# Patient Record
Sex: Male | Born: 1985 | Race: Black or African American | Hispanic: No | Marital: Single | Smoking: Never smoker
Health system: Southern US, Community
[De-identification: ages and names within clinical notes are randomized; demographics above are authoritative.]

## PROBLEM LIST (undated history)

## (undated) DIAGNOSIS — I214 Non-ST elevation (NSTEMI) myocardial infarction: Secondary | ICD-10-CM

## (undated) DIAGNOSIS — I4891 Unspecified atrial fibrillation: Secondary | ICD-10-CM

## (undated) DIAGNOSIS — I340 Nonrheumatic mitral (valve) insufficiency: Secondary | ICD-10-CM

## (undated) DIAGNOSIS — E0590 Thyrotoxicosis, unspecified without thyrotoxic crisis or storm: Secondary | ICD-10-CM

## (undated) DIAGNOSIS — Z789 Other specified health status: Secondary | ICD-10-CM

## (undated) HISTORY — PX: NO PAST SURGERIES: SHX2092

---
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
## 2007-11-06 ENCOUNTER — Emergency Department (HOSPITAL_COMMUNITY): Admission: EM | Admit: 2007-11-06 | Discharge: 2007-11-06 | Payer: Self-pay | Admitting: Emergency Medicine

## 2007-12-19 ENCOUNTER — Emergency Department (HOSPITAL_COMMUNITY): Admission: EM | Admit: 2007-12-19 | Discharge: 2007-12-19 | Payer: Self-pay | Admitting: Emergency Medicine

## 2008-10-10 ENCOUNTER — Emergency Department (HOSPITAL_COMMUNITY): Admission: EM | Admit: 2008-10-10 | Discharge: 2008-10-10 | Payer: Self-pay | Admitting: Emergency Medicine
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
## 2010-08-21 LAB — URINALYSIS, ROUTINE W REFLEX MICROSCOPIC
Nitrite: NEGATIVE
Specific Gravity, Urine: 1.02 (ref 1.005–1.030)
pH: 8 (ref 5.0–8.0)

## 2010-08-21 LAB — TYPE AND SCREEN
ABO/RH(D): O POS
Antibody Screen: NEGATIVE

## 2010-08-21 LAB — COMPREHENSIVE METABOLIC PANEL
ALT: 13 U/L (ref 0–53)
Alkaline Phosphatase: 70 U/L (ref 39–117)
Chloride: 103 mEq/L (ref 96–112)
Glucose, Bld: 103 mg/dL — ABNORMAL HIGH (ref 70–99)
Potassium: 3.5 mEq/L (ref 3.5–5.1)
Sodium: 138 mEq/L (ref 135–145)
Total Bilirubin: 0.9 mg/dL (ref 0.3–1.2)
Total Protein: 7.2 g/dL (ref 6.0–8.3)

## 2010-08-21 LAB — DIFFERENTIAL
Basophils Relative: 0 % (ref 0–1)
Eosinophils Absolute: 0.1 10*3/uL (ref 0.0–0.7)
Monocytes Absolute: 0.6 10*3/uL (ref 0.1–1.0)
Monocytes Relative: 9 % (ref 3–12)
Neutrophils Relative %: 72 % (ref 43–77)

## 2010-08-21 LAB — CBC
Hemoglobin: 14.3 g/dL (ref 13.0–17.0)
MCHC: 35.9 g/dL (ref 30.0–36.0)
MCV: 90.2 fL (ref 78.0–100.0)
RBC: 4.42 MIL/uL (ref 4.22–5.81)
RDW: 12.1 % (ref 11.5–15.5)

## 2010-08-21 LAB — RAPID URINE DRUG SCREEN, HOSP PERFORMED: Tetrahydrocannabinol: POSITIVE — AB

## 2010-08-21 LAB — PROTIME-INR: Prothrombin Time: 13.1 seconds (ref 11.6–15.2)

## 2011-02-07 LAB — URINALYSIS, ROUTINE W REFLEX MICROSCOPIC
Glucose, UA: NEGATIVE
Leukocytes, UA: NEGATIVE
Nitrite: NEGATIVE
Protein, ur: 30 — AB
Urobilinogen, UA: 4 — ABNORMAL HIGH

## 2011-02-07 LAB — RAPID STREP SCREEN (MED CTR MEBANE ONLY): Streptococcus, Group A Screen (Direct): NEGATIVE

## 2011-02-07 LAB — DIFFERENTIAL
Eosinophils Absolute: 0
Lymphs Abs: 1.2
Neutrophils Relative %: 50

## 2011-02-07 LAB — CBC
MCV: 87.4
Platelets: 187
WBC: 5

## 2011-02-07 LAB — BASIC METABOLIC PANEL
BUN: 12
Creatinine, Ser: 0.99
GFR calc non Af Amer: >60

## 2011-02-07 LAB — STREP A DNA PROBE

## 2011-02-07 LAB — URINE MICROSCOPIC-ADD ON

## 2011-02-08 LAB — CBC
Hemoglobin: 14.1
MCHC: 33.6
RBC: 4.58
WBC: 9.1

## 2011-02-08 LAB — POCT I-STAT, CHEM 8
BUN: 8
Calcium, Ion: 1.14
Chloride: 105
Creatinine, Ser: 1.2
Glucose, Bld: 108 — ABNORMAL HIGH
Potassium: 3.8

## 2011-02-08 LAB — DIFFERENTIAL
Lymphocytes Relative: 11 — ABNORMAL LOW
Lymphs Abs: 1
Monocytes Absolute: 0.7
Monocytes Relative: 8
Neutro Abs: 7.2

## 2016-08-16 ENCOUNTER — Emergency Department (HOSPITAL_COMMUNITY): Payer: No Typology Code available for payment source

## 2016-08-16 ENCOUNTER — Emergency Department (HOSPITAL_COMMUNITY)
Admission: EM | Admit: 2016-08-16 | Discharge: 2016-08-16 | Disposition: A | Discharge status: Home / Self Care | Payer: No Typology Code available for payment source | Attending: Emergency Medicine | Admitting: Emergency Medicine

## 2016-08-16 ENCOUNTER — Encounter (HOSPITAL_COMMUNITY): Payer: Self-pay

## 2016-08-16 DIAGNOSIS — M549 Dorsalgia, unspecified: Secondary | ICD-10-CM

## 2016-08-16 DIAGNOSIS — M546 Pain in thoracic spine: Secondary | ICD-10-CM | POA: Diagnosis not present

## 2016-08-16 DIAGNOSIS — S0181XA Laceration without foreign body of other part of head, initial encounter: Secondary | ICD-10-CM | POA: Diagnosis not present

## 2016-08-16 DIAGNOSIS — M542 Cervicalgia: Secondary | ICD-10-CM | POA: Insufficient documentation

## 2016-08-16 DIAGNOSIS — Y92410 Unspecified street and highway as the place of occurrence of the external cause: Secondary | ICD-10-CM | POA: Diagnosis not present

## 2016-08-16 DIAGNOSIS — Y9389 Activity, other specified: Secondary | ICD-10-CM | POA: Insufficient documentation

## 2016-08-16 DIAGNOSIS — Z23 Encounter for immunization: Secondary | ICD-10-CM | POA: Diagnosis not present

## 2016-08-16 DIAGNOSIS — F329 Major depressive disorder, single episode, unspecified: Secondary | ICD-10-CM | POA: Diagnosis not present

## 2016-08-16 DIAGNOSIS — F32A Depression, unspecified: Secondary | ICD-10-CM

## 2016-08-16 DIAGNOSIS — R52 Pain, unspecified: Secondary | ICD-10-CM

## 2016-08-16 DIAGNOSIS — Y999 Unspecified external cause status: Secondary | ICD-10-CM | POA: Insufficient documentation

## 2016-08-16 DIAGNOSIS — S0990XA Unspecified injury of head, initial encounter: Secondary | ICD-10-CM | POA: Diagnosis present

## 2016-08-16 LAB — COMPREHENSIVE METABOLIC PANEL
ALT: 35 U/L (ref 17–63)
AST: 27 U/L (ref 15–41)
Albumin: 3.8 g/dL (ref 3.5–5.0)
Alkaline Phosphatase: 108 U/L (ref 38–126)
Anion gap: 9 (ref 5–15)
BILIRUBIN TOTAL: 0.4 mg/dL (ref 0.3–1.2)
BUN: 14 mg/dL (ref 6–20)
CO2: 24 mmol/L (ref 22–32)
CREATININE: 0.7 mg/dL (ref 0.61–1.24)
Calcium: 8.7 mg/dL — ABNORMAL LOW (ref 8.9–10.3)
Chloride: 104 mmol/L (ref 101–111)
GFR calc Af Amer: >60 mL/min (ref >60)
Glucose, Bld: 148 mg/dL — ABNORMAL HIGH (ref 65–99)
POTASSIUM: 3.7 mmol/L (ref 3.5–5.1)
Sodium: 137 mmol/L (ref 135–145)
TOTAL PROTEIN: 7 g/dL (ref 6.5–8.1)

## 2016-08-16 LAB — ACETAMINOPHEN LEVEL: Acetaminophen (Tylenol), Serum: <10 ug/mL — ABNORMAL LOW (ref 10–30)

## 2016-08-16 LAB — RAPID URINE DRUG SCREEN, HOSP PERFORMED
Amphetamines: NOT DETECTED
Barbiturates: NOT DETECTED
Benzodiazepines: NOT DETECTED
COCAINE: NOT DETECTED
OPIATES: NOT DETECTED
Tetrahydrocannabinol: POSITIVE — AB

## 2016-08-16 LAB — CBC WITH DIFFERENTIAL/PLATELET
BASOS ABS: 0 10*3/uL (ref 0.0–0.1)
Basophils Relative: 0 %
Eosinophils Absolute: 0 10*3/uL (ref 0.0–0.7)
Eosinophils Relative: 0 %
HCT: 38.5 % — ABNORMAL LOW (ref 39.0–52.0)
Hemoglobin: 14 g/dL (ref 13.0–17.0)
LYMPHS ABS: 1.5 10*3/uL (ref 0.7–4.0)
LYMPHS PCT: 14 %
MCH: 28.3 pg (ref 26.0–34.0)
MCHC: 36.4 g/dL — ABNORMAL HIGH (ref 30.0–36.0)
MCV: 77.8 fL — AB (ref 78.0–100.0)
MONO ABS: 0.8 10*3/uL (ref 0.1–1.0)
Monocytes Relative: 7 %
NEUTROS ABS: 8.4 10*3/uL — AB (ref 1.7–7.7)
Neutrophils Relative %: 79 %
Platelets: 293 10*3/uL (ref 150–400)
RBC: 4.95 MIL/uL (ref 4.22–5.81)
RDW: 12.8 % (ref 11.5–15.5)
WBC: 10.7 10*3/uL — ABNORMAL HIGH (ref 4.0–10.5)

## 2016-08-16 LAB — SALICYLATE LEVEL: Salicylate Lvl: <7 mg/dL (ref 2.8–30.0)

## 2016-08-16 LAB — ETHANOL: ALCOHOL ETHYL (B): 48 mg/dL — AB (ref <5)

## 2016-08-16 MED ORDER — SODIUM CHLORIDE 0.9 % IV BOLUS (SEPSIS)
1000.0000 mL | Freq: Once | INTRAVENOUS | Status: AC
  Administered 2016-08-16: 1000 mL via INTRAVENOUS

## 2016-08-16 MED ORDER — ALUM & MAG HYDROXIDE-SIMETH 200-200-20 MG/5ML PO SUSP: 30.0000 mL | ORAL | Status: DC | PRN

## 2016-08-16 MED ORDER — TETANUS-DIPHTH-ACELL PERTUSSIS 5-2.5-18.5 LF-MCG/0.5 IM SUSP
0.5000 mL | Freq: Once | INTRAMUSCULAR | Status: AC
  Administered 2016-08-16: 0.5 mL via INTRAMUSCULAR
  Filled 2016-08-16: qty 0.5

## 2016-08-16 MED ORDER — NAPROXEN 500 MG PO TABS: ORAL_TABLET | ORAL | 0 refills | Status: DC

## 2016-08-16 MED ORDER — LIDOCAINE-EPINEPHRINE 2 %-1:200000 IJ SOLN
20.0000 mL | Freq: Once | INTRAMUSCULAR | Status: AC
Start: 2016-08-16 — End: 2016-08-16
  Administered 2016-08-16: 20 mL
  Filled 2016-08-16: qty 20

## 2016-08-16 MED ORDER — ACETAMINOPHEN 325 MG PO TABS: 650.0000 mg | ORAL_TABLET | ORAL | Status: DC | PRN

## 2016-08-16 MED ORDER — ZOLPIDEM TARTRATE 5 MG PO TABS: 10.0000 mg | ORAL_TABLET | Freq: Every evening | ORAL | Status: DC | PRN

## 2016-08-16 MED ORDER — NICOTINE 21 MG/24HR TD PT24: 21.0000 mg | MEDICATED_PATCH | Freq: Every day | TRANSDERMAL | Status: DC

## 2016-08-16 MED ORDER — CYCLOBENZAPRINE HCL 5 MG PO TABS: 5.0000 mg | ORAL_TABLET | Freq: Three times a day (TID) | ORAL | 0 refills | Status: DC | PRN

## 2016-08-16 MED ORDER — ONDANSETRON HCL 4 MG PO TABS: 4.0000 mg | ORAL_TABLET | Freq: Three times a day (TID) | ORAL | Status: DC | PRN

## 2016-08-16 MED ORDER — IBUPROFEN 400 MG PO TABS: 600.0000 mg | ORAL_TABLET | Freq: Three times a day (TID) | ORAL | Status: DC | PRN

## 2016-08-16 MED ORDER — KETOROLAC TROMETHAMINE 30 MG/ML IJ SOLN
30.0000 mg | Freq: Once | INTRAMUSCULAR | Status: AC
  Administered 2016-08-16: 30 mg via INTRAVENOUS
  Filled 2016-08-16: qty 1
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
## 2016-08-16 NOTE — ED Notes (Signed)
Pt states "I might be depressed.  I might need to talk to somebody"

## 2016-08-16 NOTE — Discharge Instructions (Signed)
Ice packs to the injured or sore muscles for the next several days then start using heat. Take the medications for pain and muscle spasms. Keep the wound clean and dry. Use triple antibiotic ointment on the wound. The sutures need to be removed in 3-5 days.  Return to the ED for any problems listed on the head injury sheet. Recheck if you aren't improving in the next week.

## 2016-08-29 ENCOUNTER — Encounter (HOSPITAL_COMMUNITY): Payer: Self-pay | Admitting: Emergency Medicine

## 2016-08-29 ENCOUNTER — Emergency Department (HOSPITAL_COMMUNITY)
Admission: EM | Admit: 2016-08-29 | Discharge: 2016-08-29 | Disposition: A | Discharge status: Home / Self Care | Payer: No Typology Code available for payment source | Attending: Emergency Medicine | Admitting: Emergency Medicine

## 2016-08-29 DIAGNOSIS — Z79899 Other long term (current) drug therapy: Secondary | ICD-10-CM | POA: Diagnosis not present

## 2016-08-29 DIAGNOSIS — S0181XD Laceration without foreign body of other part of head, subsequent encounter: Secondary | ICD-10-CM | POA: Diagnosis not present

## 2016-08-29 DIAGNOSIS — Z4802 Encounter for removal of sutures: Secondary | ICD-10-CM

## 2016-08-29 DIAGNOSIS — F1290 Cannabis use, unspecified, uncomplicated: Secondary | ICD-10-CM | POA: Insufficient documentation

## 2016-08-29 NOTE — ED Notes (Signed)
Pt made aware to return if symptoms worsen or if any life threatening symptoms occur.   

## 2016-08-29 NOTE — Discharge Instructions (Signed)
Return here if needed,

## 2016-08-29 NOTE — ED Triage Notes (Signed)
Suture removal to rt forehead
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
## 2021-10-19 ENCOUNTER — Emergency Department (HOSPITAL_COMMUNITY): Payer: Self-pay

## 2021-10-19 ENCOUNTER — Inpatient Hospital Stay (HOSPITAL_COMMUNITY)
Admission: EM | Admit: 2021-10-19 | Discharge: 2021-10-23 | DRG: 282 | Disposition: A | Discharge status: Home / Self Care | Payer: Self-pay | Attending: Cardiology | Admitting: Cardiology

## 2021-10-19 ENCOUNTER — Encounter (HOSPITAL_COMMUNITY): Payer: Self-pay

## 2021-10-19 DIAGNOSIS — Z8249 Family history of ischemic heart disease and other diseases of the circulatory system: Secondary | ICD-10-CM

## 2021-10-19 DIAGNOSIS — I214 Non-ST elevation (NSTEMI) myocardial infarction: Principal | ICD-10-CM | POA: Diagnosis present

## 2021-10-19 DIAGNOSIS — R7989 Other specified abnormal findings of blood chemistry: Secondary | ICD-10-CM | POA: Diagnosis present

## 2021-10-19 DIAGNOSIS — I10 Essential (primary) hypertension: Secondary | ICD-10-CM | POA: Diagnosis present

## 2021-10-19 DIAGNOSIS — E0590 Thyrotoxicosis, unspecified without thyrotoxic crisis or storm: Secondary | ICD-10-CM

## 2021-10-19 DIAGNOSIS — R778 Other specified abnormalities of plasma proteins: Principal | ICD-10-CM | POA: Diagnosis present

## 2021-10-19 DIAGNOSIS — E0500 Thyrotoxicosis with diffuse goiter without thyrotoxic crisis or storm: Secondary | ICD-10-CM | POA: Diagnosis present

## 2021-10-19 DIAGNOSIS — Z597 Insufficient social insurance and welfare support: Secondary | ICD-10-CM

## 2021-10-19 HISTORY — DX: Other specified health status: Z78.9

## 2021-10-19 LAB — BASIC METABOLIC PANEL
Anion gap: 8 (ref 5–15)
BUN: 9 mg/dL (ref 6–20)
CO2: 25 mmol/L (ref 22–32)
Calcium: 9.1 mg/dL (ref 8.9–10.3)
Chloride: 107 mmol/L (ref 98–111)
Creatinine, Ser: 0.46 mg/dL — ABNORMAL LOW (ref 0.61–1.24)
GFR, Estimated: >60 mL/min (ref >60)
Glucose, Bld: 96 mg/dL (ref 70–99)
Potassium: 3.7 mmol/L (ref 3.5–5.1)
Sodium: 140 mmol/L (ref 135–145)

## 2021-10-19 LAB — CBC
HCT: 40.5 % (ref 39.0–52.0)
Hemoglobin: 14.2 g/dL (ref 13.0–17.0)
MCH: 27.6 pg (ref 26.0–34.0)
MCHC: 35.1 g/dL (ref 30.0–36.0)
MCV: 78.6 fL — ABNORMAL LOW (ref 80.0–100.0)
Platelets: 237 10*3/uL (ref 150–400)
RBC: 5.15 MIL/uL (ref 4.22–5.81)
RDW: 13.4 % (ref 11.5–15.5)
WBC: 6.5 10*3/uL (ref 4.0–10.5)
nRBC: 0 % (ref 0.0–0.2)

## 2021-10-19 LAB — D-DIMER, QUANTITATIVE: D-Dimer, Quant: 0.32 ug/mL-FEU (ref 0.00–0.50)

## 2021-10-19 LAB — PROTIME-INR
INR: 1.1 (ref 0.8–1.2)
Prothrombin Time: 13.7 seconds (ref 11.4–15.2)

## 2021-10-19 LAB — APTT: aPTT: 32 seconds (ref 24–36)

## 2021-10-19 LAB — TROPONIN I (HIGH SENSITIVITY)
Troponin I (High Sensitivity): 1415 ng/L (ref <18)
Troponin I (High Sensitivity): 1952 ng/L (ref <18)

## 2021-10-19 LAB — TSH: TSH: <0.01 u[IU]/mL — ABNORMAL LOW (ref 0.350–4.500)

## 2021-10-19 MED ORDER — ASPIRIN 81 MG PO CHEW: 324.0000 mg | CHEWABLE_TABLET | ORAL | Status: DC

## 2021-10-19 MED ORDER — SODIUM CHLORIDE 0.9 % IV BOLUS
1000.0000 mL | Freq: Once | INTRAVENOUS | Status: AC
  Administered 2021-10-19: 1000 mL via INTRAVENOUS

## 2021-10-19 MED ORDER — ONDANSETRON HCL 4 MG/2ML IJ SOLN
4.0000 mg | Freq: Four times a day (QID) | INTRAMUSCULAR | Status: DC | PRN
  Administered 2021-10-22 (×2): 4 mg via INTRAVENOUS
  Filled 2021-10-19 (×2): qty 2

## 2021-10-19 MED ORDER — NITROGLYCERIN 0.4 MG SL SUBL
0.4000 mg | SUBLINGUAL_TABLET | SUBLINGUAL | Status: DC | PRN
  Administered 2021-10-20: 0.4 mg via SUBLINGUAL
  Filled 2021-10-19: qty 1

## 2021-10-19 MED ORDER — ASPIRIN 81 MG PO TBEC
81.0000 mg | DELAYED_RELEASE_TABLET | Freq: Every day | ORAL | Status: DC
  Administered 2021-10-20 – 2021-10-23 (×3): 81 mg via ORAL
  Filled 2021-10-19 (×3): qty 1

## 2021-10-19 MED ORDER — HEPARIN (PORCINE) 25000 UT/250ML-% IV SOLN
2400.0000 [IU]/h | INTRAVENOUS | Status: DC
Start: 2021-10-19 — End: 2021-10-22
  Administered 2021-10-19: 1150 [IU]/h via INTRAVENOUS
  Administered 2021-10-20: 2100 [IU]/h via INTRAVENOUS
  Administered 2021-10-21 – 2021-10-22 (×3): 2400 [IU]/h via INTRAVENOUS
  Filled 2021-10-19 (×6): qty 250

## 2021-10-19 MED ORDER — ACETAMINOPHEN 325 MG PO TABS
650.0000 mg | ORAL_TABLET | ORAL | Status: DC | PRN
  Administered 2021-10-20 (×3): 650 mg via ORAL
  Filled 2021-10-19 (×3): qty 2

## 2021-10-19 MED ORDER — HEPARIN BOLUS VIA INFUSION
4000.0000 [IU] | Freq: Once | INTRAVENOUS | Status: AC
  Administered 2021-10-19: 4000 [IU] via INTRAVENOUS
  Filled 2021-10-19: qty 4000

## 2021-10-19 MED ORDER — ASPIRIN 300 MG RE SUPP
300.0000 mg | RECTAL | Status: DC
  Filled 2021-10-19: qty 1

## 2021-10-19 MED ORDER — ASPIRIN 325 MG PO TABS
325.0000 mg | ORAL_TABLET | Freq: Once | ORAL | Status: AC
  Administered 2021-10-19: 325 mg via ORAL
  Filled 2021-10-19: qty 1

## 2021-10-19 MED ORDER — NITROGLYCERIN IN D5W 200-5 MCG/ML-% IV SOLN
INTRAVENOUS | Status: AC
  Administered 2021-10-19: 20 mg
  Filled 2021-10-19: qty 250

## 2021-10-19 NOTE — ED Provider Notes (Signed)
[facility]-EMERGENCY DEPT Provider Note   CSN: [id number] Arrival date & time: 10/19/21  1721     History  Chief Complaint  Patient presents with   Chest Pain    Chest Pain Associated symptoms: back pain and numbness (L arm numbness)   Associated symptoms: no abdominal pain, no cough, no diaphoresis, no dizziness, no fever, no nausea, no shortness of breath and no weakness    Aaron [name] is a healthy 36 y.o. male who presented to [facility] ED for evaluation of precordial chest pain. Patient reports he was at [employer] and at noon today he began having sharp left sided chest pain with left arm numbness and left sided neck pain, also left shoulder blade pain. Pain is persistent, patient was resting when the pain began. No change in pain on exertion, pain does not improve with rest. No associated SOB, nausea, dizziness, diaphoresis. Patient has had similar pain before in the past and reports it usually self resolves. He has been told before that he has a fast heart rate. He does not have a PCP, has never seen a cardiologist. Denies symptoms of heart burn, denies worsening pain with deep breath, denies injury to the chest wall or recent strenuous lifting, is right handed.     Home Medications Prior to Admission medications   Medication Sig Start Date End Date Taking? Authorizing Provider  cyclobenzaprine (FLEXERIL) 5 MG tablet Take 1 tablet (5 mg total) by mouth 3 (three) times daily as needed. Patient not taking: Reported on 10/19/2021 08/16/16   Devoria Albe, MD  naproxen (NAPROSYN) 500 MG tablet Take 1 po BID with food prn pain Patient not taking: Reported on 10/19/2021 08/16/16   Devoria Albe, MD      Allergies    Patient has no known allergies.    Review of Systems   Review of Systems  Constitutional:  Negative for chills, diaphoresis and fever.  Respiratory:  Positive for chest tightness. Negative for cough and shortness of breath.   Cardiovascular:  Positive for chest  pain. Negative for leg swelling.  Gastrointestinal:  Negative for abdominal pain and nausea.  Musculoskeletal:  Positive for back pain, myalgias and neck pain.  Neurological:  Positive for numbness (L arm numbness). Negative for dizziness, weakness and light-headedness.    Physical Exam Updated Vital Signs BP (!) 167/108   Pulse (!) 107   Temp 98.4 F (36.9 C) (Oral)   Resp (!) 29   Ht 6\' 3"  (1.905 m)   Wt 97.5 kg   SpO2 96%   BMI 26.87 kg/m  Physical Exam Constitutional:      General: He is not in acute distress.    Appearance: He is normal weight. He is not ill-appearing or toxic-appearing.  HENT:     Head: Normocephalic and atraumatic.  Cardiovascular:     Rate and Rhythm: Regular rhythm. Tachycardia present.     Heart sounds: Murmur heard.     Systolic (loudest at apex) murmur is present with a grade of 4/6.  Pulmonary:     Effort: Pulmonary effort is normal. No tachypnea.     Breath sounds: Normal breath sounds.  Chest:     Chest wall: Tenderness present.  Abdominal:     Palpations: Abdomen is soft.     Tenderness: There is no abdominal tenderness.  Musculoskeletal:     Right lower leg: No tenderness. No edema.     Left lower leg: No tenderness. No edema.  Skin:  General: Skin is warm and dry.     Coloration: Skin is not cyanotic or pale.  Neurological:     Mental Status: He is alert and oriented to person, place, and time.  Psychiatric:        Mood and Affect: Mood normal.        Behavior: Behavior normal.     ED Results / Procedures / Treatments   Labs (all labs ordered are listed, but only abnormal results are displayed) Labs Reviewed  BASIC METABOLIC PANEL - Abnormal; Notable for the following components:      Result Value   Creatinine, Ser 0.46 (*)    All other components within normal limits  CBC - Abnormal; Notable for the following components:   MCV 78.6 (*)    All other components within normal limits  TSH - Abnormal; Notable for the  following components:   TSH <0.010 (*)    All other components within normal limits  TROPONIN I (HIGH SENSITIVITY) - Abnormal; Notable for the following components:   Troponin I (High Sensitivity) 1,415 (*)    All other components within normal limits  TROPONIN I (HIGH SENSITIVITY) - Abnormal; Notable for the following components:   Troponin I (High Sensitivity) 1,952 (*)    All other components within normal limits  D-DIMER, QUANTITATIVE  APTT  PROTIME-INR  T3, FREE  T4, FREE  HEPARIN LEVEL (UNFRACTIONATED)  CBC    EKG EKG Interpretation  Date/Time:  Friday October 19 2021 19:13:50 EDT Ventricular Rate:  106 PR Interval:  194 QRS Duration: 98 QT Interval:  350 QTC Calculation: 465 R Axis:   86 Text Interpretation: Sinus tachycardia LAE, consider biatrial enlargement ST elev, probable normal early repol pattern No significant change since last tracing Confirmed by Wandra Arthurs [phone]) on 10/19/2021 8:07:28 PM  Radiology DG Chest 2 View  Result Date: 10/19/2021 CLINICAL DATA:  Chest pain. EXAM: CHEST - 2 VIEW COMPARISON:  Chest x-ray 11/06/2007 FINDINGS: The heart size and mediastinal contours are within normal limits. Both lungs are clear. The visualized skeletal structures are unremarkable. IMPRESSION: No active cardiopulmonary disease. Electronically Signed   By: Ronney Asters M.D.   On: 10/19/2021 18:00    Procedures Procedures    Medications Ordered in ED Medications  heparin ADULT infusion 100 units/mL (25000 units/246mL) (1,150 Units/hr Intravenous New Bag/Given 10/19/21 2036)  nitroGLYCERIN 0.2 mg/mL in dextrose 5 % infusion (has no administration in time range)  sodium chloride 0.9 % bolus 1,000 mL (0 mLs Intravenous Stopped 10/19/21 1837)  aspirin tablet 325 mg (325 mg Oral Given 10/19/21 1923)  heparin bolus via infusion 4,000 Units (4,000 Units Intravenous Bolus from Bag 10/19/21 2036)    ED Course/ Medical Decision Making/ A&P                           Medical  Decision Making Amount and/or Complexity of Data Reviewed Labs: ordered. Radiology: ordered. ECG/medicine tests: ordered.  Risk OTC drugs. Prescription drug management. Decision regarding hospitalization.   KYRO THOROUGHMAN is a healthy 36 y.o. male who presented to [facility] ED for evaluation of precordial chest pain. On arrival patient was afebrile, tachycardic to 110s, hypertensive to [id number] systolic, satting well on RA. Initial differential included acute ACS, or MSK etiology given patient is young, no known risk factors for CAD. Sinus tachycardia could be secondary to ACS, dehydration, anxiety, thyroid disease, also considered PE though patient low risk. TSH and d-dimer obtained to  further evaluate. D-dimer negative. Initial EKG showed sinus tachycardia with early repolarizations without reciprocal depressions. However, initial troponin elevated 1,415. Suspect NSTEMI vs significant demand ischemia, ASA 325mg  and heparin given. Cardiology consulted. Medicine consulted for admission.   Repeat EKG ordered and showed no change since last tracing. Repeat troponin 1,952. I appreciated a systolic murmur on exam heard best at the apex, suspect MR and MR was observed on duplex bedside US. Patient denies IV drug use, cocaine use. Unclear how long patient has had a murmur since he does not seek medical care regularly. TSH very low 0.010. Undiagnosed hyperthyroidism may be related to current cardiac condition. D-dimer normal, PE unlikely. BMP and CBC unremarkable. T3 and T4 pending. Care discussed with cardiology and hospitalist, patient will be admitted for further management for precordial chest pain with hypertroponinemia in the setting of possible undiagnosed thyroid disease.    Final Clinical Impression(s) / ED Diagnoses Final diagnoses:  Elevated troponin  NSTEMI (non-ST elevated myocardial infarction) [facility])    Rx / DC Orders ED Discharge Orders     None         Wayland Denis, MD 10/19/21 2259     Drenda Freeze, MD 10/19/21 2341    Drenda Freeze, MD 10/19/21 [id number]

## 2021-10-19 NOTE — ED Triage Notes (Signed)
Pt states that he was at [employer] school today and began having L sided CP that he states he also feels in his back and his L arm. Pt denies SOB, N/V.

## 2021-10-19 NOTE — Progress Notes (Signed)
ANTICOAGULATION CONSULT NOTE - Initial Consult  Pharmacy Consult for IV heparin Indication: chest pain/ACS  No Known Allergies  Patient Measurements: Height: 6\' 3"  (190.5 cm) Weight: 97.5 kg (215 lb) IBW/kg (Calculated) : 84.5 Heparin Dosing Weight: 97.5 kg  Vital Signs: Temp: 98.4 F (36.9 C) (06/09 1732) Temp Source: Oral (06/09 1732) BP: 167/108 (06/09 1930) Pulse Rate: 107 (06/09 1930)  Labs: Recent Labs    10/19/21 1759 10/19/21 1822  HGB 14.2  --   HCT 40.5  --   PLT 237  --   APTT  --  32  LABPROT  --  13.7  INR  --  1.1  CREATININE 0.46*  --   TROPONINIHS 1,415*  --     Estimated Creatinine Clearance: 152.6 mL/min (A) (by C-G formula based on SCr of 0.46 mg/dL (L)).   Medical History: Past Medical History:  Diagnosis Date   Medical history non-contributory     Medications:  No prior to admission anticoagulants listed  Assessment: Pharmacy consulted to dose IV heparin for this 36 yo male who presented to the ED with chest pain.  Initial troponin elevated at 1,415 and NSTEMI suspected.  Cardiology consulted.  CBC: hemoglobin WNL, platelets WNL Scr less than 1 Baseline APTT and PT/INR WNL  Goal of Therapy:  Heparin level 0.3-0.7 units/ml Monitor platelets by anticoagulation protocol: Yes   Plan:  Heparin 4000 units IV bolus and Start heparin infusion at 1150 units/hr Check 6 hour heparin level  Monitor daily heparin level, CBC, signs/symptoms of bleeding    31, PharmD, BCPS Clinical Pharmacist [place] Please utilize Amion for appropriate phone number to reach the unit pharmacist [facility]) 10/19/2021 7:49 PM

## 2021-10-20 ENCOUNTER — Inpatient Hospital Stay (HOSPITAL_COMMUNITY): Payer: Self-pay

## 2021-10-20 DIAGNOSIS — E0590 Thyrotoxicosis, unspecified without thyrotoxic crisis or storm: Secondary | ICD-10-CM

## 2021-10-20 DIAGNOSIS — R778 Other specified abnormalities of plasma proteins: Secondary | ICD-10-CM

## 2021-10-20 DIAGNOSIS — I214 Non-ST elevation (NSTEMI) myocardial infarction: Secondary | ICD-10-CM

## 2021-10-20 LAB — ECHOCARDIOGRAM COMPLETE
AR max vel: 3.1 cm2
AV Area VTI: 2.79 cm2
AV Area mean vel: 2.76 cm2
AV Mean grad: 3 mmHg
AV Peak grad: 6.3 mmHg
Ao pk vel: 1.25 m/s
Area-P 1/2: 6.96 cm2
Height: 75 in
MV M vel: 5.07 m/s
MV Peak grad: 102.8 mmHg
Radius: 1.6 cm
S' Lateral: 4.3 cm
Weight: 3265.6 oz

## 2021-10-20 LAB — LIPID PANEL
Cholesterol: 103 mg/dL (ref 0–200)
HDL: 33 mg/dL — ABNORMAL LOW (ref >40)
LDL Cholesterol: 65 mg/dL (ref 0–99)
Total CHOL/HDL Ratio: 3.1 RATIO
Triglycerides: 25 mg/dL (ref <150)
VLDL: 5 mg/dL (ref 0–40)

## 2021-10-20 LAB — CBC
HCT: 37.7 % — ABNORMAL LOW (ref 39.0–52.0)
Hemoglobin: 13.6 g/dL (ref 13.0–17.0)
MCH: 28.2 pg (ref 26.0–34.0)
MCHC: 36.1 g/dL — ABNORMAL HIGH (ref 30.0–36.0)
MCV: 78.2 fL — ABNORMAL LOW (ref 80.0–100.0)
Platelets: 210 10*3/uL (ref 150–400)
RBC: 4.82 MIL/uL (ref 4.22–5.81)
RDW: 13.2 % (ref 11.5–15.5)
WBC: 6.2 10*3/uL (ref 4.0–10.5)
nRBC: 0 % (ref 0.0–0.2)

## 2021-10-20 LAB — BRAIN NATRIURETIC PEPTIDE: B Natriuretic Peptide: 353.6 pg/mL — ABNORMAL HIGH (ref 0.0–100.0)

## 2021-10-20 LAB — BASIC METABOLIC PANEL
Anion gap: 7 (ref 5–15)
BUN: 5 mg/dL — ABNORMAL LOW (ref 6–20)
CO2: 25 mmol/L (ref 22–32)
Calcium: 9.2 mg/dL (ref 8.9–10.3)
Chloride: 107 mmol/L (ref 98–111)
Creatinine, Ser: 0.63 mg/dL (ref 0.61–1.24)
GFR, Estimated: >60 mL/min (ref >60)
Glucose, Bld: 131 mg/dL — ABNORMAL HIGH (ref 70–99)
Potassium: 3.3 mmol/L — ABNORMAL LOW (ref 3.5–5.1)
Sodium: 139 mmol/L (ref 135–145)

## 2021-10-20 LAB — HEMOGLOBIN A1C
Hgb A1c MFr Bld: 4.2 % — ABNORMAL LOW (ref 4.8–5.6)
Mean Plasma Glucose: 73.84 mg/dL

## 2021-10-20 LAB — TROPONIN I (HIGH SENSITIVITY)
Troponin I (High Sensitivity): 6964 ng/L (ref <18)
Troponin I (High Sensitivity): 11164 ng/L (ref <18)
Troponin I (High Sensitivity): 7988 ng/L (ref <18)
Troponin I (High Sensitivity): 4986 ng/L (ref <18)

## 2021-10-20 LAB — HEPARIN LEVEL (UNFRACTIONATED)
Heparin Unfractionated: <0.1 IU/mL — ABNORMAL LOW (ref 0.30–0.70)
Heparin Unfractionated: 0.13 IU/mL — ABNORMAL LOW (ref 0.30–0.70)
Heparin Unfractionated: 0.19 IU/mL — ABNORMAL LOW (ref 0.30–0.70)

## 2021-10-20 LAB — MAGNESIUM: Magnesium: 1.4 mg/dL — ABNORMAL LOW (ref 1.7–2.4)

## 2021-10-20 LAB — HIV ANTIBODY (ROUTINE TESTING W REFLEX): HIV Screen 4th Generation wRfx: NONREACTIVE

## 2021-10-20 LAB — C-REACTIVE PROTEIN: CRP: 0.5 mg/dL (ref <1.0)

## 2021-10-20 LAB — SEDIMENTATION RATE: Sed Rate: 23 mm/hr — ABNORMAL HIGH (ref 0–16)

## 2021-10-20 LAB — T4, FREE: Free T4: 4.55 ng/dL — ABNORMAL HIGH (ref 0.61–1.12)

## 2021-10-20 MED ORDER — ASPIRIN 81 MG PO CHEW: 81.0000 mg | CHEWABLE_TABLET | Freq: Every day | ORAL | Status: DC

## 2021-10-20 MED ORDER — CYCLOBENZAPRINE HCL 10 MG PO TABS: 5.0000 mg | ORAL_TABLET | Freq: Three times a day (TID) | ORAL | Status: DC | PRN

## 2021-10-20 MED ORDER — HEPARIN BOLUS VIA INFUSION
3000.0000 [IU] | Freq: Once | INTRAVENOUS | Status: AC
  Administered 2021-10-20: 3000 [IU] via INTRAVENOUS
  Filled 2021-10-20: qty 3000

## 2021-10-20 MED ORDER — PERFLUTREN LIPID MICROSPHERE
1.0000 mL | INTRAVENOUS | Status: AC | PRN
  Administered 2021-10-20: 2 mL via INTRAVENOUS

## 2021-10-20 MED ORDER — ATORVASTATIN CALCIUM 80 MG PO TABS
80.0000 mg | ORAL_TABLET | Freq: Every day | ORAL | Status: DC
  Administered 2021-10-20 – 2021-10-23 (×4): 80 mg via ORAL
  Filled 2021-10-20 (×4): qty 1

## 2021-10-20 MED ORDER — MAGNESIUM SULFATE 2 GM/50ML IV SOLN
2.0000 g | Freq: Once | INTRAVENOUS | Status: AC
  Administered 2021-10-20: 2 g via INTRAVENOUS
  Filled 2021-10-20: qty 50

## 2021-10-20 MED ORDER — METOPROLOL TARTRATE 50 MG PO TABS
50.0000 mg | ORAL_TABLET | Freq: Two times a day (BID) | ORAL | Status: DC
  Administered 2021-10-20 – 2021-10-23 (×8): 50 mg via ORAL
  Filled 2021-10-20 (×8): qty 1

## 2021-10-20 MED ORDER — POTASSIUM CHLORIDE CRYS ER 20 MEQ PO TBCR
30.0000 meq | EXTENDED_RELEASE_TABLET | Freq: Three times a day (TID) | ORAL | Status: AC
  Administered 2021-10-20 (×2): 30 meq via ORAL
  Filled 2021-10-20 (×2): qty 1

## 2021-10-20 MED ORDER — HEPARIN BOLUS VIA INFUSION
2800.0000 [IU] | Freq: Once | INTRAVENOUS | Status: AC
Start: 2021-10-20 — End: 2021-10-20
  Administered 2021-10-20: 2800 [IU] via INTRAVENOUS
  Filled 2021-10-20: qty 2800

## 2021-10-20 NOTE — Progress Notes (Signed)
ANTICOAGULATION CONSULT NOTE - Follow Up Consult  Pharmacy Consult for Heparin Indication: chest pain/ACS  No Known Allergies  Patient Measurements: Height: 6\' 3"  (190.5 cm) Weight: 92.6 kg (204 lb 1.6 oz) IBW/kg (Calculated) : 84.5 Heparin Dosing Weight: 97.5 kg  Vital Signs: Temp: 98.7 F (37.1 C) (06/10 1149) Temp Source: Oral (06/10 1149) BP: 151/83 (06/10 1149) Pulse Rate: 86 (06/10 1149)  Labs: Recent Labs    10/19/21 1759 10/19/21 1822 10/19/21 1933 10/20/21 0107 10/20/21 0337 10/20/21 1056 10/20/21 1828  HGB 14.2  --   --  13.6  --   --   --   HCT 40.5  --   --  37.7*  --   --   --   PLT 237  --   --  210  --   --   --   APTT  --  32  --   --   --   --   --   LABPROT  --  13.7  --   --   --   --   --   INR  --  1.1  --   --   --   --   --   HEPARINUNFRC  --   --   --   --  <0.10* 0.13* 0.19*  CREATININE 0.46*  --   --  0.63  --   --   --   TROPONINIHS 1,415*  --    < > 6,964* 11,164* 7,988*  --    < > = values in this interval not displayed.     Estimated Creatinine Clearance: 152.6 mL/min (by C-G formula based on SCr of 0.63 mg/dL).   Medications:  Scheduled:   aspirin EC  81 mg Oral Daily   atorvastatin  80 mg Oral Daily   metoprolol tartrate  50 mg Oral BID   potassium chloride  30 mEq Oral TID   Infusions:   heparin 1,900 Units/hr (10/20/21 1240)    Assessment: 36 yo M presenting with CP in setting of uncontrolled hyperthyroidism. Pt not on anticoagulation PTA. Pharmacy consulted for heparin dosing.   Heparin level this evening is subtherapeutic at 0.19, on 1600 units/hr. Increased slightly since rate increase. Hgb 13.6, plt 210. Heparin level appears to have been drawn appropriately.    Goal of Therapy:  Heparin level 0.3-0.7 units/ml Monitor platelets by anticoagulation protocol: Yes   Plan:  Increase heparin gtt to 2100 units/hr Check ~ 6 hr heparin level.  Daily CBC, heparin level. Monitor for signs/symptoms of  bleeding.   31, PharmD, MBA, MS Pharmacy Resident [phone] 10/20/2021 7:09 PM

## 2021-10-20 NOTE — Progress Notes (Signed)
  Echocardiogram 2D Echocardiogram has been performed.  Roosvelt Maser F 10/20/2021, 2:45 PM

## 2021-10-20 NOTE — Plan of Care (Signed)
  Problem: Clinical Measurements: Goal: Respiratory complications will improve Outcome: Progressing Goal: Cardiovascular complication will be avoided 10/20/2021 1652 by Daiva Nakayama, RN Outcome: Progressing 10/20/2021 1150 by Daiva Nakayama, RN Outcome: Progressing   Problem: Coping: Goal: Level of anxiety will decrease Outcome: Progressing

## 2021-10-20 NOTE — Progress Notes (Signed)
Orders placed for LHC on Monday. KVO for precath fluids while pending echo. If normal LVEF, may adjust fluids pre-cath.   Marcelino Duster, PA-C 10/20/2021, 11:52 AM [phone] [facility] [address] [place], [place] [address]

## 2021-10-20 NOTE — Plan of Care (Signed)
  Problem: Clinical Measurements: Goal: Respiratory complications will improve Outcome: Progressing Goal: Cardiovascular complication will be avoided Outcome: Progressing   Problem: Coping: Goal: Level of anxiety will decrease Outcome: Progressing   

## 2021-10-20 NOTE — Progress Notes (Signed)
Patient seen and examined.  He is now chest pain-free.  His troponin significantly went up to 11,164.  Plan for transthoracic echo today.  We Arista Kettlewell continue to monitor on telemetry and with IV heparin.  Plan for left heart catheterization on Monday.  Loman Brooklyn, MD

## 2021-10-20 NOTE — H&P (Addendum)
Cardiology Admission History and Physical:   Patient ID: Aaron [name] MRN: [id number]; DOB: 09-22-1985   Admission date: 10/19/2021  PCP:  Patient, No Pcp Per (Inactive)   [facility] Providers Cardiologist:  None        Chief Complaint:  chest pain  Patient Profile:   Aaron [name] is a 36 y.o. male with untreated hypertension who is being seen 10/20/2021 for the evaluation of chest pain and NSTEMI.  History of Present Illness:   Mr. Aaron [name] is fairly healthy, reports BP elevated although unsure how high previously, otherwise not prescribed any medications. Aunt with early heart disease, father with heart disease in 58s. No tobacco, no cocaine/meth, no OTCs. Does note longstanding elevated heart rate, sweats, tremor. Intermittent chest pain left sided sharp radiating to back up to hours several times in the past. 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 Presented to ED. HR 100s, BP 150s-160s. EKG with LVH/early repolarization changes and sinus tachycardia. Troponin 1200 -> 1900. TSH undetectably low (never previously checked, does have Fhx of thyroid disease). Other labs normal. Started on NTG gtt and heparin with improved symptoms. Admitted to cardiology   Past Medical History:  Diagnosis Date   Medical history non-contributory     Past Surgical History:  Procedure Laterality Date   NO PAST SURGERIES       Medications Prior to Admission: Prior to Admission medications   Medication Sig Start Date End Date Taking? Authorizing Provider  cyclobenzaprine (FLEXERIL) 5 MG tablet Take 1 tablet (5 mg total) by mouth 3 (three) times daily as needed. Patient not taking: Reported on 10/19/2021 08/16/16   Rolland Porter, MD  naproxen (NAPROSYN) 500 MG tablet Take 1 po BID with food prn pain Patient not taking: Reported on 10/19/2021 08/16/16   Rolland Porter, MD     Allergies:   No Known  Allergies  Social History:   Social History   Socioeconomic History   Marital status: Single    Spouse name: Not on file   Number of children: Not on file   Years of education: Not on file   Highest education level: Not on file  Occupational History   Not on file  Tobacco Use   Smoking status: Never   Smokeless tobacco: Never  Vaping Use   Vaping Use: Never used  Substance and Sexual Activity   Alcohol use: No   Drug use: Yes    Frequency: 3.0 times per week    Types: Marijuana   Sexual activity: Never  Other Topics Concern   Not on file  Social History Narrative   Not on file   Social Determinants of Health   Financial Resource Strain: Not on file  Food Insecurity: Not on file  Transportation Needs: Not on file  Physical Activity: Not on file  Stress: Not on file  Social Connections: Not on file  Intimate Partner Violence: Not on file    Family History:   The patient's family history is not on file.    ROS:  Please see the history of present illness.  All other ROS reviewed and negative.     Physical Exam/Data:   Vitals:   10/19/21 1930 10/19/21 2000 10/19/21 2100 10/19/21 2200  BP: (!) 167/108 (!) 168/101 (!) 163/106 (!) 164/109  Pulse: (!) 107 (!) 115 95 (!) 114  Resp: (!) 29 (!) 27 (!) 27 (!)  32  Temp:      TempSrc:      SpO2: 96% 98% 99% 98%  Weight:      Height:        Intake/Output Summary (Last 24 hours) at 10/20/2021 0037 Last data filed at 10/19/2021 2316 Gross per 24 hour  Intake 170.04 ml  Output --  Net 170.04 ml      10/19/2021    6:02 PM 08/29/2016   12:21 PM  Last 3 Weights  Weight (lbs) 215 lb 215 lb  Weight (kg) 97.523 kg 97.523 kg     Body mass index is 26.87 kg/m.  General:  Well nourished, well developed, in no acute distress HEENT: normal Neck: no JVD. Thyroid diffusely enlarged Vascular: No carotid bruits; Distal pulses 2+ bilaterally   Cardiac:  tachycardic, normal S1, S2; RRR; systolic ejection murmur best heard  LLSB Lungs:  clear to auscultation bilaterally, no wheezing, rhonchi or rales  Abd: soft, nontender, no hepatomegaly  Ext: no edema Musculoskeletal:  No deformities, BUE and BLE strength normal and equal Skin: warm and dry  Neuro:  CNs 2-12 intact, no focal abnormalities noted Psych:  Normal affect     Laboratory Data:  High Sensitivity Troponin:   Recent Labs  Lab 10/19/21 1759 10/19/21 1933  TROPONINIHS 1,415* 1,952*      Chemistry Recent Labs  Lab 10/19/21 1759  NA 140  K 3.7  CL 107  CO2 25  GLUCOSE 96  BUN 9  CREATININE 0.46*  CALCIUM 9.1  GFRNONAA >60  ANIONGAP 8    No results for input(s): "PROT", "ALBUMIN", "AST", "ALT", "ALKPHOS", "BILITOT" in the last 168 hours. Lipids No results for input(s): "CHOL", "TRIG", "HDL", "LABVLDL", "LDLCALC", "CHOLHDL" in the last 168 hours. Hematology Recent Labs  Lab 10/19/21 1759  WBC 6.5  RBC 5.15  HGB 14.2  HCT 40.5  MCV 78.6*  MCH 27.6  MCHC 35.1  RDW 13.4  PLT 237   Thyroid  Recent Labs  Lab 10/19/21 1759 10/19/21 1822  TSH <0.010*  --   FREET4  --  4.55*   BNPNo results for input(s): "BNP", "PROBNP" in the last 168 hours.  DDimer  Recent Labs  Lab 10/19/21 1822  DDIMER 0.32     Radiology/Studies:  DG Chest 2 View  Result Date: 10/19/2021 CLINICAL DATA:  Chest pain. EXAM: CHEST - 2 VIEW COMPARISON:  Chest x-ray 11/06/2007 FINDINGS: The heart size and mediastinal contours are within normal limits. Both lungs are clear. The visualized skeletal structures are unremarkable. IMPRESSION: No active cardiopulmonary disease. Electronically Signed   By: Ronney Asters M.D.   On: 10/19/2021 18:00     Assessment and Plan:  Mr Aaron [name] presented with chest pain and elevated troponins most concerning for type I NSTEMI vs myocarditis, and newly diagnosed hyperthyroidism  Elevated troponins, chest pain: Will empirically treat as T1 NSTEMI with heparin gtt, aspirin, liptior 80. NTG gtt for symptom relief. Metoprolol  50 BID for antianginal effect. Obtain A1c, lipid panel, LP(a). However, I am suspicious of myocarditis, including hyperthyroid myocarditis, as well. Obtaining ESR, CRP, BNP, HIV. Will obtain TTE in AM.  Optimally would obtain a LHC. However, contrast is relatively contraindicated given Jod-Basedow phenomenon (iodine loading a patient with untreated hyperthyroidism can precipitate thyrotoxicosis). A CMR with contrast may be the best study to differentiate ischemia and myocarditis 2.  Hyperthyroidism: Differential including Graves and hyperthyroid goiter, on exam thyroid felt like Graves. T3/T4 levels pending. Ordering TPO and TSI antibodies. Metoprolol as above  for symptom management. Consult endocrine to assist with treatment of hyperthyroidism and further inpatient workup and to advise on the safety of giving contrast   Risk Assessment/Risk Scores:    TIMI Risk Score for Unstable Angina or Non-ST Elevation MI:   The patient's TIMI risk score is  , which indicates a  % risk of all cause mortality, new or recurrent myocardial infarction or need for urgent revascularization in the next 14 days.       Severity of Illness: The appropriate patient status for this patient is INPATIENT. Inpatient status is judged to be reasonable and necessary in order to provide the required intensity of service to ensure the patient's safety. The patient's presenting symptoms, physical exam findings, and initial radiographic and laboratory data in the context of their chronic comorbidities is felt to place them at high risk for further clinical deterioration. Furthermore, it is not anticipated that the patient will be medically stable for discharge from the hospital within 2 midnights of admission.   * I certify that at the point of admission it is my clinical judgment that the patient will require inpatient hospital care spanning beyond 2 midnights from the point of admission due to high intensity of service, high risk  for further deterioration and high frequency of surveillance required.*   For questions or updates, please contact [place] Please consult [email or web address] for contact info under     Signed, Martinique Orean Giarratano, MD  10/20/2021 12:37 AM

## 2021-10-20 NOTE — Progress Notes (Signed)
   10/19/21 2100  Assess: MEWS Score  Temp 98.4 F (36.9 C)  BP (!) 163/106  MAP (mmHg) 122  Pulse Rate 95  ECG Heart Rate 92  Resp (!) 27  Level of Consciousness Alert  SpO2 99 %  O2 Device Room Air  Assess: MEWS Score  MEWS Temp 0  MEWS Systolic 0  MEWS Pulse 0  MEWS RR 2  MEWS LOC 0  MEWS Score 2  MEWS Score Color Yellow  Assess: if the MEWS score is Yellow or Red  Were vital signs taken at a resting state? Yes  Focused Assessment No change from prior assessment  MEWS guidelines implemented *See Row Information* No, vital signs rechecked  Treat  Pain Scale 0-10  Pain Score 0  Take Vital Signs  Increase Vital Sign Frequency  Yellow: Q 2hr X 2 then Q 4hr X 2, if remains yellow, continue Q 4hrs  Escalate  MEWS: Escalate Yellow: discuss with charge nurse/RN and consider discussing with provider and RRT  Notify: Charge Nurse/RN  Name of Charge Nurse/RN Notified Heather RN  Date Charge Nurse/RN Notified 10/19/21  Time Charge Nurse/RN Notified 2100  Notify: Provider  Provider Name/Title Tannerbaum MD  Date Provider Notified 10/19/21  Time Provider Notified 2100  Method of Notification Page  Notification Reason Other (Comment) (pATIENT ARRIVAL ON UNIT)  Provider response En route  Date of Provider Response 10/19/21  Time of Provider Response 2105  Document  Patient Outcome Other (Comment) (Titrating medications as needed)  Progress note created (see row info) Yes  Assess: SIRS CRITERIA  SIRS Temperature  0  SIRS Pulse 1  SIRS Respirations  1  SIRS WBC 0  SIRS Score Sum  2

## 2021-10-20 NOTE — Progress Notes (Signed)
ANTICOAGULATION CONSULT NOTE - Follow Up Consult  Pharmacy Consult for heparin Indication: chest pain/ACS  Labs: Recent Labs    10/19/21 1759 10/19/21 1822 10/19/21 1933 10/20/21 0107 10/20/21 0337  HGB 14.2  --   --  13.6  --   HCT 40.5  --   --  37.7*  --   PLT 237  --   --  210  --   APTT  --  32  --   --   --   LABPROT  --  13.7  --   --   --   INR  --  1.1  --   --   --   HEPARINUNFRC  --   --   --   --  <0.10*  CREATININE 0.46*  --   --  0.63  --   TROPONINIHS 1,415*  --  1,952* 6,964*  --     Assessment: 35yo male subtherapeutic on heparin with initial dosing for CP in setting of uncontrolled hyperthyroidism; no infusion issues or signs of bleeding per RN.  Goal of Therapy:  Heparin level 0.3-0.7 units/ml   Plan:  Will rebolus with heparin 3000 units and increase heparin infusion by 4 units/kg/hr to 1600 units/hr and check level in 6 hours.    Vernard Gambles, PharmD, BCPS  10/20/2021,4:39 AM

## 2021-10-20 NOTE — Progress Notes (Signed)
ANTICOAGULATION CONSULT NOTE - Follow Up Consult  Pharmacy Consult for Heparin Indication: chest pain/ACS  No Known Allergies  Patient Measurements: Height: 6\' 3"  (190.5 cm) Weight: 92.6 kg (204 lb 1.6 oz) IBW/kg (Calculated) : 84.5 Heparin Dosing Weight: 97.5 kg  Vital Signs: Temp: 98.7 F (37.1 C) (06/10 1149) Temp Source: Oral (06/10 1149) BP: 151/83 (06/10 1149) Pulse Rate: 86 (06/10 1149)  Labs: Recent Labs    10/19/21 1759 10/19/21 1822 10/19/21 1933 10/20/21 0107 10/20/21 0337 10/20/21 1056  HGB 14.2  --   --  13.6  --   --   HCT 40.5  --   --  37.7*  --   --   PLT 237  --   --  210  --   --   APTT  --  32  --   --   --   --   LABPROT  --  13.7  --   --   --   --   INR  --  1.1  --   --   --   --   HEPARINUNFRC  --   --   --   --  <0.10* 0.13*  CREATININE 0.46*  --   --  0.63  --   --   TROPONINIHS 1,415*  --  1,952* 6,964* 11,164*  --     Estimated Creatinine Clearance: 152.6 mL/min (by C-G formula based on SCr of 0.63 mg/dL).   Medications:  Scheduled:   aspirin EC  81 mg Oral Daily   atorvastatin  80 mg Oral Daily   metoprolol tartrate  50 mg Oral BID   potassium chloride  30 mEq Oral TID   Infusions:   heparin 1,600 Units/hr (10/20/21 0515)   magnesium sulfate bolus IVPB 2 g (10/20/21 1139)    Assessment: 36 yo M presenting with CP in setting of uncontrolled hyperthyroidism. Pt not on anticoagulation PTA. Pharmacy consulted for heparin dosing.   Heparin level today is subtherapeutic at 0.13, on 1600 units/hr. Hgb 13.6, plt 210. Heparin level appears to have been drawn appropriately from patient's right arm (opposite to IV heparin infusing in left arm), no line issues or signs/symptoms of bleeding noted per RN.   Goal of Therapy:  Heparin level 0.3-0.7 units/ml Monitor platelets by anticoagulation protocol: Yes   Plan:  Give heparin bolus 2800 units x 1, then increase infusion rate to 1900 units/hr. Check ~ 6 hr heparin level.  Daily CBC,  heparin level. Monitor for signs/symptoms of bleeding.   31, PharmD PGY1 Pharmacy Resident Phone [phone] 10/20/2021 11:51 AM   Please check AMION for all [facility] Pharmacy phone numbers After 10:00 PM, call Main Pharmacy [phone]

## 2021-10-21 LAB — CBC
HCT: 42.3 % (ref 39.0–52.0)
Hemoglobin: 15.1 g/dL (ref 13.0–17.0)
MCH: 28.2 pg (ref 26.0–34.0)
MCHC: 35.7 g/dL (ref 30.0–36.0)
MCV: 78.9 fL — ABNORMAL LOW (ref 80.0–100.0)
Platelets: 246 10*3/uL (ref 150–400)
RBC: 5.36 MIL/uL (ref 4.22–5.81)
RDW: 13.1 % (ref 11.5–15.5)
WBC: 8.1 10*3/uL (ref 4.0–10.5)
nRBC: 0 % (ref 0.0–0.2)

## 2021-10-21 LAB — BASIC METABOLIC PANEL
Anion gap: 7 (ref 5–15)
BUN: 6 mg/dL (ref 6–20)
CO2: 24 mmol/L (ref 22–32)
Calcium: 9.2 mg/dL (ref 8.9–10.3)
Chloride: 106 mmol/L (ref 98–111)
Creatinine, Ser: 0.6 mg/dL — ABNORMAL LOW (ref 0.61–1.24)
GFR, Estimated: >60 mL/min (ref >60)
Glucose, Bld: 128 mg/dL — ABNORMAL HIGH (ref 70–99)
Potassium: 3.7 mmol/L (ref 3.5–5.1)
Sodium: 137 mmol/L (ref 135–145)

## 2021-10-21 LAB — LIPOPROTEIN A (LPA): Lipoprotein (a): <8.4 nmol/L (ref <75.0)

## 2021-10-21 LAB — THYROID STIMULATING IMMUNOGLOBULIN: Thyroid Stimulating Immunoglob: 14.8 IU/L — ABNORMAL HIGH (ref 0.00–0.55)

## 2021-10-21 LAB — HEPARIN LEVEL (UNFRACTIONATED)
Heparin Unfractionated: 0.25 IU/mL — ABNORMAL LOW (ref 0.30–0.70)
Heparin Unfractionated: 0.41 IU/mL (ref 0.30–0.70)
Heparin Unfractionated: 0.41 IU/mL (ref 0.30–0.70)

## 2021-10-21 LAB — THYROID PEROXIDASE ANTIBODY: Thyroperoxidase Ab SerPl-aCnc: >600 IU/mL — ABNORMAL HIGH (ref 0–34)

## 2021-10-21 MED ORDER — POTASSIUM CHLORIDE CRYS ER 10 MEQ PO TBCR
30.0000 meq | EXTENDED_RELEASE_TABLET | Freq: Once | ORAL | Status: AC
  Administered 2021-10-21: 30 meq via ORAL
  Filled 2021-10-21: qty 1

## 2021-10-21 NOTE — Progress Notes (Signed)
ANTICOAGULATION CONSULT NOTE - Follow Up Consult  Pharmacy Consult for Heparin Indication: chest pain/ACS  No Known Allergies  Patient Measurements: Height: 6\' 3"  (190.5 cm) Weight: 89.1 kg (196 lb 8 oz) IBW/kg (Calculated) : 84.5 Heparin Dosing Weight: 97.5 kg  Vital Signs: Temp: 98.8 F (37.1 C) (06/11 1029) Temp Source: Oral (06/11 1029) BP: 150/89 (06/11 1029) Pulse Rate: 80 (06/11 1029)  Labs: Recent Labs    10/19/21 1759 10/19/21 1822 10/19/21 1933 10/20/21 0107 10/20/21 0107 10/20/21 0337 10/20/21 1056 10/20/21 1828 10/21/21 0058 10/21/21 0939  HGB 14.2  --   --  13.6  --   --   --   --  15.1  --   HCT 40.5  --   --  37.7*  --   --   --   --  42.3  --   PLT 237  --   --  210  --   --   --   --  246  --   APTT  --  32  --   --   --   --   --   --   --   --   LABPROT  --  13.7  --   --   --   --   --   --   --   --   INR  --  1.1  --   --   --   --   --   --   --   --   HEPARINUNFRC  --   --   --   --    < > <0.10* 0.13* 0.19* 0.25* 0.41  CREATININE 0.46*  --   --  0.63  --   --   --   --  0.60*  --   TROPONINIHS 1,415*  --    < > 6,964*  --  11,164* 7,988* 4,986*  --   --    < > = values in this interval not displayed.    Estimated Creatinine Clearance: 152.6 mL/min (A) (by C-G formula based on SCr of 0.6 mg/dL (L)).   Medications:  Scheduled:   aspirin EC  81 mg Oral Daily   atorvastatin  80 mg Oral Daily   metoprolol tartrate  50 mg Oral BID   potassium chloride  30 mEq Oral Once   potassium chloride  30 mEq Oral Once   Infusions:   heparin 2,400 Units/hr (10/21/21 1034)    Assessment: 36 yo M presenting with CP in setting of uncontrolled hyperthyroidism. Pt not on anticoagulation PTA. Pharmacy consulted for heparin dosing.   Heparin level today is therapeutic at 0.41, on 2400 units/hr. Hgb 15.1, plt 246. No line issues or signs/symptoms of bleeding noted per RN.  Goal of Therapy:  Heparin level 0.3-0.7 units/ml Monitor platelets by  anticoagulation protocol: Yes   Plan:  Continue IV heparin at 2400 units/hr. Check ~6 hr heparin level.  Daily CBC, heparin level. Monitor for signs/symptoms of bleeding.   31, PharmD PGY1 Pharmacy Resident Phone [phone] 10/21/2021 11:17 AM   Please check AMION for all [facility] Pharmacy phone numbers After 10:00 PM, call Main Pharmacy [phone]

## 2021-10-21 NOTE — Progress Notes (Signed)
ANTICOAGULATION CONSULT NOTE - Follow Up Consult  Pharmacy Consult for Heparin Indication: chest pain/ACS  No Known Allergies  Patient Measurements: Height: 6\' 3"  (190.5 cm) Weight: 89.1 kg (196 lb 8 oz) IBW/kg (Calculated) : 84.5 Heparin Dosing Weight: 97.5 kg  Vital Signs: Temp: 98.8 F (37.1 C) (06/11 1029) Temp Source: Oral (06/11 1029) BP: 150/89 (06/11 1029) Pulse Rate: 80 (06/11 1029)  Labs: Recent Labs    10/19/21 1759 10/19/21 1822 10/19/21 1933 10/20/21 0107 10/20/21 0107 10/20/21 0337 10/20/21 1056 10/20/21 1828 10/21/21 0058 10/21/21 0939 10/21/21 1548  HGB 14.2  --   --  13.6  --   --   --   --  15.1  --   --   HCT 40.5  --   --  37.7*  --   --   --   --  42.3  --   --   PLT 237  --   --  210  --   --   --   --  246  --   --   APTT  --  32  --   --   --   --   --   --   --   --   --   LABPROT  --  13.7  --   --   --   --   --   --   --   --   --   INR  --  1.1  --   --   --   --   --   --   --   --   --   HEPARINUNFRC  --   --   --   --    < > <0.10* 0.13* 0.19* 0.25* 0.41 0.41  CREATININE 0.46*  --   --  0.63  --   --   --   --  0.60*  --   --   TROPONINIHS 1,415*  --    < > 6,964*  --  11,164* 7,988* 4,986*  --   --   --    < > = values in this interval not displayed.     Estimated Creatinine Clearance: 152.6 mL/min (A) (by C-G formula based on SCr of 0.6 mg/dL (L)).   Medications:  Scheduled:   aspirin EC  81 mg Oral Daily   atorvastatin  80 mg Oral Daily   metoprolol tartrate  50 mg Oral BID   Infusions:   heparin 2,400 Units/hr (10/21/21 1034)    Assessment: 36 yo M presenting with CP in setting of uncontrolled hyperthyroidism. Pt not on anticoagulation PTA. Pharmacy consulted for heparin dosing.   Confirmatory heparin level today is therapeutic at 0.41, on 2400 units/hr. Hgb 15.1, plt 246. No line issues or signs/symptoms of bleeding noted per RN.  Goal of Therapy:  Heparin level 0.3-0.7 units/ml Monitor platelets by  anticoagulation protocol: Yes   Plan:  Continue IV heparin at 2400 units/hr. Daily CBC, heparin level. Monitor for signs/symptoms of bleeding.  31, PharmD, MBA, MS Pharmacy Resident [phone] 10/21/2021 4:35 PM

## 2021-10-21 NOTE — Progress Notes (Signed)
ANTICOAGULATION CONSULT NOTE - Follow Up Consult  Pharmacy Consult for Heparin Indication: chest pain/ACS  No Known Allergies  Patient Measurements: Height: 6\' 3"  (190.5 cm) Weight: 92.6 kg (204 lb 1.6 oz) IBW/kg (Calculated) : 84.5 Heparin Dosing Weight: 97.5 kg  Vital Signs: Temp: 99.3 F (37.4 C) (06/10 2030) Temp Source: Oral (06/10 2030) BP: 148/72 (06/10 2030) Pulse Rate: 93 (06/10 2030)  Labs: Recent Labs    10/19/21 1759 10/19/21 1822 10/19/21 1933 10/20/21 0107 10/20/21 0107 10/20/21 0337 10/20/21 1056 10/20/21 1828 10/21/21 0058  HGB 14.2  --   --  13.6  --   --   --   --  15.1  HCT 40.5  --   --  37.7*  --   --   --   --  42.3  PLT 237  --   --  210  --   --   --   --  246  APTT  --  32  --   --   --   --   --   --   --   LABPROT  --  13.7  --   --   --   --   --   --   --   INR  --  1.1  --   --   --   --   --   --   --   HEPARINUNFRC  --   --   --   --    < > <0.10* 0.13* 0.19* 0.25*  CREATININE 0.46*  --   --  0.63  --   --   --   --  0.60*  TROPONINIHS 1,415*  --    < > 6,964*  --  11,164* 7,988* 4,986*  --    < > = values in this interval not displayed.     Estimated Creatinine Clearance: 152.6 mL/min (A) (by C-G formula based on SCr of 0.6 mg/dL (L)).   Medications:  Scheduled:   aspirin EC  81 mg Oral Daily   atorvastatin  80 mg Oral Daily   metoprolol tartrate  50 mg Oral BID   Infusions:   heparin 2,100 Units/hr (10/20/21 2327)    Assessment: 36 yo M presenting with CP in setting of uncontrolled hyperthyroidism. Pt not on anticoagulation PTA. Pharmacy consulted for heparin dosing.   Heparin level still subtherapeutic: 0.25, on 2100 units/hr. No issues with infusion or overt bleeding reported; CBC stable   Goal of Therapy:  Heparin level 0.3-0.7 units/ml Monitor platelets by anticoagulation protocol: Yes   Plan:  Increase heparin gtt to 2400 units/hr Check ~ 6 hr heparin level.  Daily CBC, heparin level. Monitor for  signs/symptoms of bleeding.   [id number], PharmD Clinical Pharmacist 10/21/2021 2:34 AM Please check AMION for all [facility] Pharmacy numbers

## 2021-10-21 NOTE — Progress Notes (Signed)
Progress Note  Patient Name: Aaron MohsGary M [name] Date of Encounter: 10/21/2021  [facility] Cardiologist: None   Subjective   Currently feeling well without chest pain or shortness of breath  Inpatient Medications    Scheduled Meds:  aspirin EC  81 mg Oral Daily   atorvastatin  80 mg Oral Daily   metoprolol tartrate  50 mg Oral BID   Continuous Infusions:  heparin 2,400 Units/hr (10/21/21 0500)   PRN Meds: acetaminophen, cyclobenzaprine, nitroGLYCERIN, ondansetron (ZOFRAN) IV   Vital Signs    Vitals:   10/20/21 1149 10/20/21 2018 10/20/21 2030 10/21/21 0413  BP: (!) 151/83 (!) 154/85 (!) 148/72 (!) 166/90  Pulse: 86 91 93 85  Resp: 19  20 20   Temp: 98.7 F (37.1 C)  99.3 F (37.4 C) 98.6 F (37 C)  TempSrc: Oral  Oral Oral  SpO2: 99%  99%   Weight:    89.1 kg  Height:        Intake/Output Summary (Last 24 hours) at 10/21/2021 0824 Last data filed at 10/21/2021 0500 Gross per 24 hour  Intake 1445.36 ml  Output --  Net 1445.36 ml      10/21/2021    4:13 AM 10/20/2021    5:00 AM 10/19/2021    6:02 PM  Last 3 Weights  Weight (lbs) 196 lb 8 oz 204 lb 1.6 oz 215 lb  Weight (kg) 89.132 kg 92.579 kg 97.523 kg      Telemetry    Sinus rhythm- Personally Reviewed  ECG    None new- Personally Reviewed  Physical Exam   GEN: No acute distress.   Neck: No JVD Cardiac: RRR, no murmurs, rubs, or gallops.  Respiratory: Clear to auscultation bilaterally. GI: Soft, nontender, non-distended  MS: No edema; No deformity. Neuro:  Nonfocal  Psych: Normal affect   Labs    High Sensitivity Troponin:   Recent Labs  Lab 10/19/21 1933 10/20/21 0107 10/20/21 0337 10/20/21 1056 10/20/21 1828  TROPONINIHS 1,952* 6,964* 11,164* 7,988* 4,986*     Chemistry Recent Labs  Lab 10/19/21 1759 10/20/21 0107 10/21/21 0058  NA 140 139 137  K 3.7 3.3* 3.7  CL 107 107 106  CO2 25 25 24   GLUCOSE 96 131* 128*  BUN 9 5* 6  CREATININE 0.46* 0.63 0.60*  CALCIUM 9.1 9.2 9.2   MG  --  1.4*  --   GFRNONAA >60 >60 >60  ANIONGAP 8 7 7     Lipids  Recent Labs  Lab 10/20/21 0107  CHOL 103  TRIG 25  HDL 33*  LDLCALC 65  CHOLHDL 3.1    Hematology Recent Labs  Lab 10/19/21 1759 10/20/21 0107 10/21/21 0058  WBC 6.5 6.2 8.1  RBC 5.15 4.82 5.36  HGB 14.2 13.6 15.1  HCT 40.5 37.7* 42.3  MCV 78.6* 78.2* 78.9*  MCH 27.6 28.2 28.2  MCHC 35.1 36.1* 35.7  RDW 13.4 13.2 13.1  PLT 237 210 246   Thyroid  Recent Labs  Lab 10/19/21 1759 10/19/21 1822  TSH <0.010*  --   FREET4  --  4.55*    BNP Recent Labs  Lab 10/20/21 0107  BNP 353.6*    DDimer  Recent Labs  Lab 10/19/21 1822  DDIMER 0.32     Radiology    ECHOCARDIOGRAM COMPLETE  Result Date: 10/20/2021    ECHOCARDIOGRAM REPORT   Patient Name:   Aaron [name] Date of Exam: 10/20/2021 Medical Rec #:  [id number]    Height:  75.0 in Accession #:    [id number]   Weight:       204.1 lb Date of Birth:  10-Dec-1985    BSA:          2.213 m Patient Age:    36 years     BP:           151/83 mmHg Patient Gender: M            HR:           95 bpm. Exam Location:  Inpatient Procedure: 2D Echo, Cardiac Doppler, Color Doppler and Intracardiac            Opacification Agent Indications:    NSTEMI  History:        Patient has no prior history of Echocardiogram examinations.                 Signs/Symptoms:Chest Pain.  Sonographer:    Roosvelt Maser RDCS Referring Phys: [id number] [place] TANNENBAUM IMPRESSIONS  1. 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  2. Left ventricular ejection fraction, by estimation, is 55 to 60%. The left ventricle has normal function. The left ventricle has no regional wall motion abnormalities. The left ventricular internal cavity size was mildly dilated. There is mild left ventricular hypertrophy. Left ventricular diastolic parameters were normal.  3. Right ventricular systolic function is normal. The right ventricular size is mildly enlarged.  4. Left atrial  size was moderately dilated.  5. Right atrial size was mildly dilated.  6. The mitral valve is normal in structure. Mild mitral valve regurgitation. No evidence of mitral stenosis. 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  7. The aortic valve is tricuspid. Aortic valve regurgitation is not visualized. No aortic stenosis is present.  8. The inferior vena cava is normal in size with greater than 50% respiratory variability, suggesting right atrial pressure of 3 mmHg. FINDINGS  Left Ventricle: Left ventricular ejection fraction, by estimation, is 55 to 60%. The left ventricle has normal function. The left ventricle has no regional wall motion abnormalities. Definity contrast agent was given IV to delineate the left ventricular  endocardial borders. The left ventricular internal cavity size was mildly dilated. There is mild left ventricular hypertrophy. Left ventricular diastolic parameters were normal. Right Ventricle: The right ventricular size is mildly enlarged. Right ventricular systolic function is normal. Left Atrium: Left atrial size was moderately dilated. Right Atrium: Right atrial size was mildly dilated. Pericardium: There is no evidence of pericardial effusion. Mitral Valve: The mitral valve is normal in structure. 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 Mild mitral valve regurgitation. No evidence of mitral valve stenosis. Tricuspid Valve: The tricuspid valve is normal in structure. Tricuspid valve regurgitation is mild . No evidence of tricuspid stenosis. Aortic Valve: The aortic valve is tricuspid. Aortic valve regurgitation is not visualized. No aortic stenosis is present. Aortic valve mean gradient measures 3.0 mmHg. Aortic valve peak gradient measures 6.2 mmHg. Aortic valve area, by VTI measures 2.79 cm. Pulmonic Valve: The pulmonic valve was normal in structure. Pulmonic valve regurgitation is trivial. No evidence of pulmonic  stenosis. Aorta: The aortic root is normal in size and structure. Venous: The inferior vena cava is normal in size with greater than 50% respiratory variability, suggesting right atrial pressure of 3 mmHg. IAS/Shunts: No atrial level shunt detected by color flow Doppler. 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  LEFT VENTRICLE PLAX 2D LVIDd:         5.60 cm   Diastology LVIDs:         4.30 cm   LV e' medial:    11.20 cm/s LV PW:         1.20 cm   LV E/e' medial:  7.7 LV IVS:        1.10 cm   LV e' lateral:   12.00 cm/s LVOT diam:     2.10 cm   LV E/e' lateral: 7.2 LV SV:         57 LV SV Index:   26 LVOT Area:     3.46 cm  RIGHT VENTRICLE RV Basal diam:  4.70 cm RV Mid diam:    3.80 cm RV S prime:     13.60 cm/s TAPSE (M-mode): 3.2 cm LEFT ATRIUM           Index        RIGHT ATRIUM           Index LA diam:      4.50 cm 2.03 cm/m   RA Area:     23.60 cm LA Vol (A4C): 99.4 ml 44.91 ml/m  RA Volume:   77.50 ml  35.01 ml/m  AORTIC VALVE AV Area (Vmax):    3.10 cm AV Area (Vmean):   2.76 cm AV Area (VTI):     2.79 cm AV Vmax:           125.00 cm/s AV Vmean:          86.200 cm/s AV VTI:            0.206 m AV Peak Grad:      6.2 mmHg AV Mean Grad:      3.0 mmHg LVOT Vmax:         112.00 cm/s LVOT Vmean:        68.700 cm/s LVOT VTI:          0.166 m LVOT/AV VTI ratio: 0.81  AORTA Ao Root diam: 3.50 cm Ao Asc diam:  3.00 cm MITRAL VALVE MV Area (PHT): 6.96 cm       SHUNTS MV Decel Time: 109 msec       Systemic VTI:  0.17 m MR Peak grad:    102.8 mmHg   Systemic Diam: 2.10 cm MR Vmax:         507.00 cm/s MR PISA:         16.08 cm MR PISA Eff ROA: 96 mm MR PISA Radius:  1.60 cm MV E velocity: 86.50 cm/s MV A velocity: 51.90 cm/s MV E/A ratio:  1.67 Olga Millers MD Electronically signed by Olga Millers MD Signature Date/Time: 10/20/2021/3:10:20 PM    Final    DG Chest 2 View  Result Date: 10/19/2021 CLINICAL DATA:  Chest pain. EXAM: CHEST - 2 VIEW  COMPARISON:  Chest x-ray 11/06/2007 FINDINGS: The heart size and mediastinal contours are within normal limits. Both lungs are clear. The visualized skeletal structures are unremarkable. IMPRESSION: No active cardiopulmonary disease. Electronically Signed   By: Darliss Cheney M.D.   On: 10/19/2021 18:00    Cardiac Studies   TTE 10/20/21  1. 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   2. Left ventricular ejection fraction, by estimation, is 55 to 60%. The  left ventricle has normal function. The left ventricle has no regional  wall motion abnormalities. The  left ventricular internal cavity size was  mildly dilated. There is mild left  ventricular hypertrophy. Left ventricular diastolic parameters were  normal.   3. Right ventricular systolic function is normal. The right ventricular  size is mildly enlarged.   4. Left atrial size was moderately dilated.   5. Right atrial size was mildly dilated.   6. The mitral valve is normal in structure. Mild mitral valve  regurgitation. No evidence of mitral stenosis. There is moderate  holosystolic prolapse of both leaflets of the mitral valve.   7. The aortic valve is tricuspid. Aortic valve regurgitation is not  visualized. No aortic stenosis is present.   8. The inferior vena cava is normal in size with greater than 50%  respiratory variability, suggesting right atrial pressure of 3 mmHg.   Patient Profile     36 y.o. male who presented to the hospital with chest pain, found to have ST changes and elevated troponin consistent with MI  Assessment & Plan    1.  Non-STEMI: Patient has significant troponin elevation and ST changes.  We Sible Straley plan for left heart catheterization tomorrow.  Risk and benefits of been discussed.  The patient understands the risks of the procedure and agreed.  We Norie Latendresse continue IV heparin.  The patient understands that risks include but are not limited to stroke (1 in 1000), death (1  in 1000), kidney failure [usually temporary] (1 in 500), bleeding (1 in 200), allergic reaction [possibly serious] (1 in 200), and agrees to proceed.   For questions or updates, please contact [facility] Please consult [email or web address] for contact info under        Signed, Chauncy Mangiaracina Jorja Loa, MD  10/21/2021, 8:24 AM

## 2021-10-22 ENCOUNTER — Inpatient Hospital Stay (HOSPITAL_COMMUNITY): Payer: Self-pay

## 2021-10-22 ENCOUNTER — Encounter (HOSPITAL_COMMUNITY)
Admission: EM | Disposition: A | Discharge status: Home / Self Care | Payer: Self-pay | Source: Home / Self Care | Attending: Cardiology

## 2021-10-22 DIAGNOSIS — I214 Non-ST elevation (NSTEMI) myocardial infarction: Principal | ICD-10-CM

## 2021-10-22 HISTORY — PX: LEFT HEART CATH AND CORONARY ANGIOGRAPHY: CATH118249

## 2021-10-22 LAB — CBC
HCT: 45.7 % (ref 39.0–52.0)
Hemoglobin: 16.5 g/dL (ref 13.0–17.0)
MCH: 28.4 pg (ref 26.0–34.0)
MCHC: 36.1 g/dL — ABNORMAL HIGH (ref 30.0–36.0)
MCV: 78.5 fL — ABNORMAL LOW (ref 80.0–100.0)
Platelets: 254 10*3/uL (ref 150–400)
RBC: 5.82 MIL/uL — ABNORMAL HIGH (ref 4.22–5.81)
RDW: 13.2 % (ref 11.5–15.5)
WBC: 8.7 10*3/uL (ref 4.0–10.5)
nRBC: 0 % (ref 0.0–0.2)

## 2021-10-22 LAB — BASIC METABOLIC PANEL
Anion gap: 8 (ref 5–15)
BUN: 8 mg/dL (ref 6–20)
CO2: 23 mmol/L (ref 22–32)
Calcium: 9.7 mg/dL (ref 8.9–10.3)
Chloride: 105 mmol/L (ref 98–111)
Creatinine, Ser: 0.65 mg/dL (ref 0.61–1.24)
GFR, Estimated: >60 mL/min (ref >60)
Glucose, Bld: 100 mg/dL — ABNORMAL HIGH (ref 70–99)
Potassium: 4.8 mmol/L (ref 3.5–5.1)
Sodium: 136 mmol/L (ref 135–145)

## 2021-10-22 LAB — GLUCOSE, CAPILLARY: Glucose-Capillary: 98 mg/dL (ref 70–99)

## 2021-10-22 LAB — HEPARIN LEVEL (UNFRACTIONATED): Heparin Unfractionated: 0.33 IU/mL (ref 0.30–0.70)

## 2021-10-22 SURGERY — LEFT HEART CATH AND CORONARY ANGIOGRAPHY
Anesthesia: LOCAL

## 2021-10-22 MED ORDER — VERAPAMIL HCL 2.5 MG/ML IV SOLN
INTRAVENOUS | Status: AC
  Filled 2021-10-22: qty 2

## 2021-10-22 MED ORDER — VERAPAMIL HCL 2.5 MG/ML IV SOLN
INTRAVENOUS | Status: DC | PRN
  Administered 2021-10-22: 10 mL via INTRA_ARTERIAL

## 2021-10-22 MED ORDER — LABETALOL HCL 5 MG/ML IV SOLN
10.0000 mg | INTRAVENOUS | Status: AC | PRN
Start: 2021-10-22 — End: 2021-10-23

## 2021-10-22 MED ORDER — SODIUM CHLORIDE 0.9% FLUSH: 3.0000 mL | INTRAVENOUS | Status: DC | PRN

## 2021-10-22 MED ORDER — METHIMAZOLE 10 MG PO TABS
10.0000 mg | ORAL_TABLET | Freq: Three times a day (TID) | ORAL | Status: DC
  Administered 2021-10-22: 10 mg via ORAL
  Filled 2021-10-22 (×3): qty 1

## 2021-10-22 MED ORDER — MIDAZOLAM HCL 2 MG/2ML IJ SOLN
INTRAMUSCULAR | Status: AC
  Filled 2021-10-22: qty 2

## 2021-10-22 MED ORDER — HEPARIN (PORCINE) IN NACL 1000-0.9 UT/500ML-% IV SOLN
INTRAVENOUS | Status: AC
  Filled 2021-10-22: qty 1000

## 2021-10-22 MED ORDER — HEPARIN SODIUM (PORCINE) 1000 UNIT/ML IJ SOLN
INTRAMUSCULAR | Status: AC
  Filled 2021-10-22: qty 10

## 2021-10-22 MED ORDER — ONDANSETRON HCL 4 MG/2ML IJ SOLN
INTRAMUSCULAR | Status: DC | PRN
  Administered 2021-10-22: 4 mg via INTRAVENOUS

## 2021-10-22 MED ORDER — METHIMAZOLE 10 MG PO TABS
10.0000 mg | ORAL_TABLET | Freq: Three times a day (TID) | ORAL | Status: DC
  Administered 2021-10-22 – 2021-10-23 (×2): 10 mg via ORAL
  Filled 2021-10-22 (×4): qty 1

## 2021-10-22 MED ORDER — SODIUM CHLORIDE 0.9 % IV SOLN: 250.0000 mL | INTRAVENOUS | Status: DC | PRN

## 2021-10-22 MED ORDER — SODIUM CHLORIDE 0.9 % IV SOLN: INTRAVENOUS | Status: DC

## 2021-10-22 MED ORDER — SODIUM CHLORIDE 0.9 % IV SOLN: INTRAVENOUS | Status: AC

## 2021-10-22 MED ORDER — FENTANYL CITRATE (PF) 100 MCG/2ML IJ SOLN
INTRAMUSCULAR | Status: AC
  Filled 2021-10-22: qty 2

## 2021-10-22 MED ORDER — LIDOCAINE HCL (PF) 1 % IJ SOLN
INTRAMUSCULAR | Status: AC
  Filled 2021-10-22: qty 30

## 2021-10-22 MED ORDER — LIDOCAINE HCL (PF) 1 % IJ SOLN
INTRAMUSCULAR | Status: DC | PRN
  Administered 2021-10-22: 2 mL

## 2021-10-22 MED ORDER — SODIUM CHLORIDE 0.9% FLUSH
3.0000 mL | Freq: Two times a day (BID) | INTRAVENOUS | Status: DC
  Administered 2021-10-22 – 2021-10-23 (×2): 3 mL via INTRAVENOUS

## 2021-10-22 MED ORDER — SODIUM CHLORIDE 0.9% FLUSH
3.0000 mL | Freq: Two times a day (BID) | INTRAVENOUS | Status: DC
Start: 2021-10-22 — End: 2021-10-22

## 2021-10-22 MED ORDER — HEPARIN (PORCINE) IN NACL 1000-0.9 UT/500ML-% IV SOLN
INTRAVENOUS | Status: DC | PRN
  Administered 2021-10-22 (×2): 500 mL

## 2021-10-22 MED ORDER — FENTANYL CITRATE (PF) 100 MCG/2ML IJ SOLN
INTRAMUSCULAR | Status: DC | PRN
  Administered 2021-10-22: 50 ug via INTRAVENOUS

## 2021-10-22 MED ORDER — ENOXAPARIN SODIUM 40 MG/0.4ML IJ SOSY
40.0000 mg | PREFILLED_SYRINGE | INTRAMUSCULAR | Status: DC
  Administered 2021-10-23: 40 mg via SUBCUTANEOUS
  Filled 2021-10-22: qty 0.4

## 2021-10-22 MED ORDER — HEPARIN SODIUM (PORCINE) 1000 UNIT/ML IJ SOLN
INTRAMUSCULAR | Status: DC | PRN
  Administered 2021-10-22: 4500 [IU] via INTRAVENOUS

## 2021-10-22 MED ORDER — MIDAZOLAM HCL 2 MG/2ML IJ SOLN
INTRAMUSCULAR | Status: DC | PRN
  Administered 2021-10-22: 1 mg via INTRAVENOUS

## 2021-10-22 MED ORDER — HYDRALAZINE HCL 20 MG/ML IJ SOLN: 10.0000 mg | INTRAMUSCULAR | Status: AC | PRN

## 2021-10-22 MED ORDER — ONDANSETRON HCL 4 MG/2ML IJ SOLN
INTRAMUSCULAR | Status: AC
  Filled 2021-10-22: qty 2

## 2021-10-22 MED ORDER — IOHEXOL 350 MG/ML SOLN
INTRAVENOUS | Status: DC | PRN
  Administered 2021-10-22: 40 mL

## 2021-10-22 MED ORDER — ASPIRIN 81 MG PO CHEW
81.0000 mg | CHEWABLE_TABLET | ORAL | Status: AC
  Administered 2021-10-22: 81 mg via ORAL
  Filled 2021-10-22: qty 1

## 2021-10-22 SURGICAL SUPPLY — 9 items
BAND ZEPHYR COMPRESS 30 LONG (HEMOSTASIS) ×1 IMPLANT
CATH 5FR JL3.5 JR4 ANG PIG MP (CATHETERS) ×1 IMPLANT
GLIDESHEATH SLEND SS 6F .021 (SHEATH) ×1 IMPLANT
GUIDEWIRE INQWIRE 1.5J.035X260 (WIRE) IMPLANT
INQWIRE 1.5J .035X260CM (WIRE) ×2
KIT HEART LEFT (KITS) ×2 IMPLANT
PACK CARDIAC CATHETERIZATION (CUSTOM PROCEDURE TRAY) ×2 IMPLANT
TRANSDUCER W/STOPCOCK (MISCELLANEOUS) ×2 IMPLANT
TUBING CIL FLEX 10 FLL-RA (TUBING) ×2 IMPLANT

## 2021-10-22 NOTE — Progress Notes (Signed)
Patient received education on cardiac catheterization and radial site care.

## 2021-10-22 NOTE — Progress Notes (Addendum)
 Progress Note  Patient Name: Aaron [name] Date of Encounter: 10/22/2021  [facility] Cardiologist: New to [facility] Dr Anjalee Cope   Subjective   Patient states he is feeling fine, no more chest pain. He denied any SOB. He states he has hx of chronic anxiety, tremor, heat intolerance, weight loss, and heart palpitation since he was a teenager. He has no insurance. He has no PCP. He denied any throat/neck tenderness or vision disturbance.   Inpatient Medications    Scheduled Meds:  aspirin EC  81 mg Oral Daily   atorvastatin  80 mg Oral Daily   methimazole  10 mg Oral Q8H   metoprolol tartrate  50 mg Oral BID   sodium chloride flush  3 mL Intravenous Q12H   Continuous Infusions:  sodium chloride     sodium chloride 10 mL/hr at 10/22/21 0500   heparin 2,400 Units/hr (10/22/21 0835)   PRN Meds: sodium chloride, acetaminophen, cyclobenzaprine, nitroGLYCERIN, ondansetron (ZOFRAN) IV, sodium chloride flush   Vital Signs    Vitals:   10/21/21 2230 10/22/21 0444 10/22/21 0640 10/22/21 0831  BP: (!) 160/80 138/70  (!) 149/73  Pulse: 99 80  92  Resp:      Temp:  97.8 F (36.6 C)    TempSrc:  Axillary    SpO2:  99%    Weight:   88 kg   Height:        Intake/Output Summary (Last 24 hours) at 10/22/2021 0838 Last data filed at 10/22/2021 0500 Gross per 24 hour  Intake 1115.44 ml  Output --  Net 1115.44 ml      10/22/2021    6:40 AM 10/21/2021    4:13 AM 10/20/2021    5:00 AM  Last 3 Weights  Weight (lbs) 193 lb 14.4 oz 196 lb 8 oz 204 lb 1.6 oz  Weight (kg) 87.952 kg 89.132 kg 92.579 kg      Telemetry    Sinus rhythm/tachycardia 90-120s  - Personally Reviewed  ECG    Sinus rhythm with inferior leads ST elevation  - Personally Reviewed  Physical Exam   GEN: No acute distress. Tall. No exophthalmos.  Neck: No JVD. Goiter noted  Cardiac: Regular rhythm, tachycardiac, no murmurs, rubs, or gallops.  Respiratory: Clear to auscultation bilaterally. On room air.   GI: Soft, nontender, non-distended  MS: No leg edema; No deformity. Neuro:  Alert and oriented x3, no cognitive deficit   Psych: Flat affect   Labs    High Sensitivity Troponin:   Recent Labs  Lab 10/19/21 1933 10/20/21 0107 10/20/21 0337 10/20/21 1056 10/20/21 1828  TROPONINIHS 1,952* 6,964* 11,164* 7,988* 4,986*     Chemistry Recent Labs  Lab 10/20/21 0107 10/21/21 0058 10/22/21 0048  NA 139 137 136  K 3.3* 3.7 4.8  CL 107 106 105  CO2 25 24 23  GLUCOSE 131* 128* 100*  BUN 5* 6 8  CREATININE 0.63 0.60* 0.65  CALCIUM 9.2 9.2 9.7  MG 1.4*  --   --   GFRNONAA >60 >60 >60  ANIONGAP 7 7 8    Lipids  Recent Labs  Lab 10/20/21 0107  CHOL 103  TRIG 25  HDL 33*  LDLCALC 65  CHOLHDL 3.1    Hematology Recent Labs  Lab 10/20/21 0107 10/21/21 0058 10/22/21 0048  WBC 6.2 8.1 8.7  RBC 4.82 5.36 5.82*  HGB 13.6 15.1 16.5  HCT 37.7* 42.3 45.7  MCV 78.2* 78.9* 78.5*  MCH 28.2 28.2 28.4  MCHC 36.1*   35.7 36.1*  RDW 13.2 13.1 13.2  PLT 210 246 254   Thyroid  Recent Labs  Lab 10/19/21 1759 10/19/21 1822  TSH <0.010*  --   FREET4  --  4.55*    BNP Recent Labs  Lab 10/20/21 0107  BNP 353.6*    DDimer  Recent Labs  Lab 10/19/21 1822  DDIMER 0.32     Radiology    ECHOCARDIOGRAM COMPLETE  Result Date: 10/20/2021    ECHOCARDIOGRAM REPORT   Patient Name:   Aaron [name] Date of Exam: 10/20/2021 Medical Rec #:  [id number]    Height:       75.0 in Accession #:    [id number]   Weight:       204.1 lb Date of Birth:  07/26/1985    BSA:          2.213 m Patient Age:    35 years     BP:           151/83 mmHg Patient Gender: M            HR:           95 bpm. Exam Location:  Inpatient Procedure: 2D Echo, Cardiac Doppler, Color Doppler and Intracardiac            Opacification Agent Indications:    NSTEMI  History:        Patient has no prior history of Echocardiogram examinations.                 Signs/Symptoms:Chest Pain.  Sonographer:    Rachel Lane RDCS Referring  Phys: [id number] [place] TANNENBAUM IMPRESSIONS  1. 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  2. Left ventricular ejection fraction, by estimation, is 55 to 60%. The left ventricle has normal function. The left ventricle has no regional wall motion abnormalities. The left ventricular internal cavity size was mildly dilated. There is mild left ventricular hypertrophy. Left ventricular diastolic parameters were normal.  3. Right ventricular systolic function is normal. The right ventricular size is mildly enlarged.  4. Left atrial size was moderately dilated.  5. Right atrial size was mildly dilated.  6. The mitral valve is normal in structure. Mild mitral valve regurgitation. No evidence of mitral stenosis. 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  7. The aortic valve is tricuspid. Aortic valve regurgitation is not visualized. No aortic stenosis is present.  8. The inferior vena cava is normal in size with greater than 50% respiratory variability, suggesting right atrial pressure of 3 mmHg. FINDINGS  Left Ventricle: Left ventricular ejection fraction, by estimation, is 55 to 60%. The left ventricle has normal function. The left ventricle has no regional wall motion abnormalities. Definity contrast agent was given IV to delineate the left ventricular  endocardial borders. The left ventricular internal cavity size was mildly dilated. There is mild left ventricular hypertrophy. Left ventricular diastolic parameters were normal. Right Ventricle: The right ventricular size is mildly enlarged. Right ventricular systolic function is normal. Left Atrium: Left atrial size was moderately dilated. Right Atrium: Right atrial size was mildly dilated. Pericardium: There is no evidence of pericardial effusion. Mitral Valve: The mitral valve is normal in structure. 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 Mild mitral valve  regurgitation. No evidence of mitral valve stenosis. Tricuspid Valve: The tricuspid valve is normal in structure. Tricuspid valve regurgitation is mild . No evidence of tricuspid stenosis.   Aortic Valve: The aortic valve is tricuspid. Aortic valve regurgitation is not visualized. No aortic stenosis is present. Aortic valve mean gradient measures 3.0 mmHg. Aortic valve peak gradient measures 6.2 mmHg. Aortic valve area, by VTI measures 2.79 cm. Pulmonic Valve: The pulmonic valve was normal in structure. Pulmonic valve regurgitation is trivial. No evidence of pulmonic stenosis. Aorta: The aortic root is normal in size and structure. Venous: The inferior vena cava is normal in size with greater than 50% respiratory variability, suggesting right atrial pressure of 3 mmHg. IAS/Shunts: No atrial level shunt detected by color flow Doppler. 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  LEFT VENTRICLE PLAX 2D LVIDd:         5.60 cm   Diastology LVIDs:         4.30 cm   LV e' medial:    11.20 cm/s LV PW:         1.20 cm   LV E/e' medial:  7.7 LV IVS:        1.10 cm   LV e' lateral:   12.00 cm/s LVOT diam:     2.10 cm   LV E/e' lateral: 7.2 LV SV:         57 LV SV Index:   26 LVOT Area:     3.46 cm  RIGHT VENTRICLE RV Basal diam:  4.70 cm RV Mid diam:    3.80 cm RV S prime:     13.60 cm/s TAPSE (M-mode): 3.2 cm LEFT ATRIUM           Index        RIGHT ATRIUM           Index LA diam:      4.50 cm 2.03 cm/m   RA Area:     23.60 cm LA Vol (A4C): 99.4 ml 44.91 ml/m  RA Volume:   77.50 ml  35.01 ml/m  AORTIC VALVE AV Area (Vmax):    3.10 cm AV Area (Vmean):   2.76 cm AV Area (VTI):     2.79 cm AV Vmax:           125.00 cm/s AV Vmean:          86.200 cm/s AV VTI:            0.206 m AV Peak Grad:      6.2 mmHg AV Mean Grad:      3.0 mmHg LVOT Vmax:         112.00 cm/s LVOT Vmean:        68.700 cm/s LVOT VTI:          0.166 m LVOT/AV VTI ratio: 0.81  AORTA Ao Root diam:  3.50 cm Ao Asc diam:  3.00 cm MITRAL VALVE MV Area (PHT): 6.96 cm       SHUNTS MV Decel Time: 109 msec       Systemic VTI:  0.17 m MR Peak grad:    102.8 mmHg   Systemic Diam: 2.10 cm MR Vmax:         507.00 cm/s MR PISA:         16.08 cm MR PISA Eff ROA: 96 mm MR PISA Radius:  1.60 cm MV E velocity: 86.50 cm/s MV A velocity: 51.90 cm/s MV E/A ratio:  1.67 Brian Crenshaw MD Electronically signed by Brian Crenshaw MD Signature Date/Time: 10/20/2021/3:10:20 PM    Final     Cardiac Studies   Echo from 10/20/21:     1. 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   2. Left ventricular ejection fraction, by estimation, is 55 to 60%. The  left ventricle has normal function. The left ventricle has no regional  wall motion abnormalities. The left ventricular internal cavity size was  mildly dilated. There is mild left  ventricular hypertrophy. Left ventricular diastolic parameters were  normal.   3. Right ventricular systolic function is normal. The right ventricular  size is mildly enlarged.   4. Left atrial size was moderately dilated.   5. Right atrial size was mildly dilated.   6. The mitral valve is normal in structure. Mild mitral valve  regurgitation. No evidence of mitral stenosis. There is moderate  holosystolic prolapse of both leaflets of the mitral valve.   7. The aortic valve is tricuspid. Aortic valve regurgitation is not  visualized. No aortic stenosis is present.   8. The inferior vena cava is normal in size with greater than 50%  respiratory variability, suggesting right atrial pressure of 3 mmHg.   Patient Profile     35 y.o. male with PMH of  untreated hypertension, who presented to the ER 10/19/2021 with chief complaints of chest pain radiating to back. HS trop 1952 >6964 >11164 >7988>4986. EKG at admission with Subtle ST elevation of inferior leads.  Echo from 10/20/2021 with mild left MVP with probable mild MR, LVEF 55 to 60%, no regional  wall motion abnormality, mild LVH, normal diastolic parameter, normal RV, moderate LAE, mild RAE.  He is planned for cardiac catheterization.  Assessment & Plan    Non-STEMI -Presented with chest pain -High sensitive troponin 1952 >6964 >11164 >7988>4986.  -EKG at admission with subtle ST elevation of inferior leads -Echocardiogram from 10/20/2021 with LVEF 55 to 60%, no WMA -Plan for cardiac catheterization today -Medical therapy: Started on aspirin 81 mg, Lipitor 80 mg metoprolol 50 mg twice daily; further adjustment pending cath results  Hypertension -Initial blood pressure elevated 170s systolically, historically not taking medication -Started on metoprolol 50 mg twice daily here, BP improving now   Hyperthyroidism - reports hx of chronic anxiety, tremor, heat intolerance, weight loss, and heart palpitation since he was a teenager  - no exophthalmos, + Goiter on exam, + tachycardia  - TSH <0.01, FT4 elevated 4.55 this admission  - TPO Ab and TSI positive, added  hyroid ultrasound today, likely Grave's disease - will start Methimazole 10mg TID today, repeat TSH and FT4 in 4-6 weeks, will need eventual ablative therapy, need PCP or Endocrinology established outpatient     For questions or updates, please contact [facility] Please consult [email or web address] for contact info under        Signed, Xika Zhao, NP  10/22/2021, 8:38 AM     Personally seen and examined. Agree with APP above with the following comments:  Briefly 35 yo M with FHX  of CAD and HTN with NSTEMI and preserved EF  Patient notes being tired and woozy and things it is realted to hunger. No CP, No SOB, no Palpitations, syncope.  Exam notable for sinus tachycardia, +2 radial +2 femoral Tele: sinus tachycardia Personally reviewed relevant tests; FT4 and TPO Ab +  For key conditions including  NSTEMI HTN Grave's disease  Would recommend  - patient is amenable to LHC - if 3VD will have prolonged course -  If PCI plan for 10/23/21 discharge - if widely patent CORS will plan for CMR 10/23/21 and discharge that day - long discussion   with patient; he is amenable to stay  Charlaine Utsey, MD Cardiologist Hypertrophic Cardiomyopathy [place]  [facility]  [address], [address] [place], [place] [address] [phone]  1:10 PM   

## 2021-10-22 NOTE — Progress Notes (Signed)
ANTICOAGULATION CONSULT NOTE - Follow Up Consult  Pharmacy Consult for Heparin Indication: chest pain/ACS  No Known Allergies  Patient Measurements: Height: 6\' 3"  (190.5 cm) Weight: 88 kg (193 lb 14.4 oz) IBW/kg (Calculated) : 84.5 Heparin Dosing Weight: 97.5 kg  Vital Signs: Temp: 97.8 F (36.6 C) (06/12 0444) Temp Source: Axillary (06/12 0444) BP: 149/73 (06/12 0831) Pulse Rate: 92 (06/12 0831)  Labs: Recent Labs    10/19/21 1822 10/19/21 1933 10/20/21 0107 10/20/21 0107 10/20/21 0337 10/20/21 1056 10/20/21 1828 10/21/21 0058 10/21/21 0939 10/21/21 1548 10/22/21 0048  HGB  --   --  13.6  --   --   --   --  15.1  --   --  16.5  HCT  --   --  37.7*  --   --   --   --  42.3  --   --  45.7  PLT  --   --  210  --   --   --   --  246  --   --  254  APTT 32  --   --   --   --   --   --   --   --   --   --   LABPROT 13.7  --   --   --   --   --   --   --   --   --   --   INR 1.1  --   --   --   --   --   --   --   --   --   --   HEPARINUNFRC  --   --   --    < > <0.10* 0.13* 0.19* 0.25* 0.41 0.41 0.33  CREATININE  --   --  0.63  --   --   --   --  0.60*  --   --  0.65  TROPONINIHS  --    < > 6,964*  --  11,164* 7,988* 4,986*  --   --   --   --    < > = values in this interval not displayed.     Estimated Creatinine Clearance: 152.6 mL/min (by C-G formula based on SCr of 0.65 mg/dL).   Medications:  Scheduled:   aspirin EC  81 mg Oral Daily   atorvastatin  80 mg Oral Daily   methimazole  10 mg Oral Q8H   metoprolol tartrate  50 mg Oral BID   sodium chloride flush  3 mL Intravenous Q12H   Infusions:   sodium chloride     sodium chloride 10 mL/hr at 10/22/21 0500   heparin 2,400 Units/hr (10/22/21 0835)    Assessment: 36 yo M presenting with CP in setting of uncontrolled hyperthyroidism. Pt not on anticoagulation PTA. Pharmacy consulted for heparin dosing.   Heparin level today is therapeutic at 033, on 2400 units/hr. Plans for cardiac cath today  Goal of  Therapy:  Heparin level 0.3-0.7 units/ml Monitor platelets by anticoagulation protocol: Yes   Plan:  Continue IV heparin at 2400 units/hr. Will follow plans post cath  31, PharmD Clinical Pharmacist **Pharmacist phone directory can now be found on [email or web address] ([email or web address]).  Listed under [facility].

## 2021-10-22 NOTE — Progress Notes (Signed)
Pt reports that he is going to leave by 1700 regardless of having heart cath or not. He states that he is more than likely not going to follow recommended medication regimen and he will try to handle things on his own. Girlfriend at bedside. Paged cardiology to advise.

## 2021-10-22 NOTE — Interval H&P Note (Signed)
History and Physical Interval Note:  10/22/2021 5:21 PM  Aaron [name]  has presented today for surgery, with the diagnosis of NSTEMI.  The various methods of treatment have been discussed with the patient and family. After consideration of risks, benefits and other options for treatment, the patient has consented to  Procedure(s): LEFT HEART CATH AND CORONARY ANGIOGRAPHY (N/A) as a surgical intervention.  The patient's history has been reviewed, patient examined, no change in status, stable for surgery.  I have reviewed the patient's chart and labs.  Questions were answered to the patient's satisfaction.    Cath Lab Visit (complete for each Cath Lab visit)  Clinical Evaluation Leading to the Procedure:   ACS: Yes.    Non-ACS:  N/A  [name]

## 2021-10-22 NOTE — Progress Notes (Signed)
Pt ambulated to bathroom and became short of breath. Pt had nausea and started vomiting. Pt states that after her vomited he felt better. Pt is asymptomatic now. Pt has refused Zofran and declines chest pain.

## 2021-10-22 NOTE — Care Management (Signed)
  Transition of Care [facility]) Screening Note   Patient Details  Name: WOODFIN KISS Date of Birth: 1985-09-02   Transition of Care [facility]) CM/SW Contact:    Gala Lewandowsky, RN Phone Number: 10/22/2021, 4:25 PM    Transition of Care Department [facility]) has reviewed the patient. Patient states he has no insurance at this time. Patient in need of PCP and is agreeable to Case Manager calling the Internal Medicine Clinic for an appointment. Appointment scheduled and placed on the AVS. Case Manager will monitor for medication cost. Patient may be a candidate for MATCH. Case Manager will continue to follow for additional transition of care needs.

## 2021-10-22 NOTE — H&P (View-Only) (Signed)
Progress Note  Patient Name: Aaron [name] Date of Encounter: 10/22/2021  [facility] Cardiologist: New to [facility]   Subjective   Patient states he is feeling fine, no more chest pain. He denied any SOB. He states he has hx of chronic anxiety, tremor, heat intolerance, weight loss, and heart palpitation since he was a teenager. He has no insurance. He has no PCP. He denied any throat/neck tenderness or vision disturbance.   Inpatient Medications    Scheduled Meds:  aspirin EC  81 mg Oral Daily   atorvastatin  80 mg Oral Daily   methimazole  10 mg Oral Q8H   metoprolol tartrate  50 mg Oral BID   sodium chloride flush  3 mL Intravenous Q12H   Continuous Infusions:  sodium chloride     sodium chloride 10 mL/hr at 10/22/21 0500   heparin 2,400 Units/hr (10/22/21 0835)   PRN Meds: sodium chloride, acetaminophen, cyclobenzaprine, nitroGLYCERIN, ondansetron (ZOFRAN) IV, sodium chloride flush   Vital Signs    Vitals:   10/21/21 2230 10/22/21 0444 10/22/21 0640 10/22/21 0831  BP: (!) 160/80 138/70  (!) 149/73  Pulse: 99 80  92  Resp:      Temp:  97.8 F (36.6 C)    TempSrc:  Axillary    SpO2:  99%    Weight:   88 kg   Height:        Intake/Output Summary (Last 24 hours) at 10/22/2021 0838 Last data filed at 10/22/2021 0500 Gross per 24 hour  Intake 1115.44 ml  Output --  Net 1115.44 ml      10/22/2021    6:40 AM 10/21/2021    4:13 AM 10/20/2021    5:00 AM  Last 3 Weights  Weight (lbs) 193 lb 14.4 oz 196 lb 8 oz 204 lb 1.6 oz  Weight (kg) 87.952 kg 89.132 kg 92.579 kg      Telemetry    Sinus rhythm/tachycardia 90-120s  - Personally Reviewed  ECG    Sinus rhythm with inferior leads ST elevation  - Personally Reviewed  Physical Exam   GEN: No acute distress. Tall. No exophthalmos.  Neck: No JVD. Goiter noted  Cardiac: Regular rhythm, tachycardiac, no murmurs, rubs, or gallops.  Respiratory: Clear to auscultation bilaterally. On room air.   GI: Soft, nontender, non-distended  MS: No leg edema; No deformity. Neuro:  Alert and oriented x3, no cognitive deficit   Psych: Flat affect   Labs    High Sensitivity Troponin:   Recent Labs  Lab 10/19/21 1933 10/20/21 0107 10/20/21 0337 10/20/21 1056 10/20/21 1828  TROPONINIHS 1,952* 6,964* 11,164* 7,988* 4,986*     Chemistry Recent Labs  Lab 10/20/21 0107 10/21/21 0058 10/22/21 0048  NA 139 137 136  K 3.3* 3.7 4.8  CL 107 106 105  CO2 25 24 23   GLUCOSE 131* 128* 100*  BUN 5* 6 8  CREATININE 0.63 0.60* 0.65  CALCIUM 9.2 9.2 9.7  MG 1.4*  --   --   GFRNONAA >60 >60 >60  ANIONGAP 7 7 8     Lipids  Recent Labs  Lab 10/20/21 0107  CHOL 103  TRIG 25  HDL 33*  LDLCALC 65  CHOLHDL 3.1    Hematology Recent Labs  Lab 10/20/21 0107 10/21/21 0058 10/22/21 0048  WBC 6.2 8.1 8.7  RBC 4.82 5.36 5.82*  HGB 13.6 15.1 16.5  HCT 37.7* 42.3 45.7  MCV 78.2* 78.9* 78.5*  MCH 28.2 28.2 28.4  MCHC 36.1*  35.7 36.1*  RDW 13.2 13.1 13.2  PLT 210 246 254   Thyroid  Recent Labs  Lab 10/19/21 1759 10/19/21 1822  TSH <0.010*  --   FREET4  --  4.55*    BNP Recent Labs  Lab 10/20/21 0107  BNP 353.6*    DDimer  Recent Labs  Lab 10/19/21 1822  DDIMER 0.32     Radiology    ECHOCARDIOGRAM COMPLETE  Result Date: 10/20/2021    ECHOCARDIOGRAM REPORT   Patient Name:   Aaron [name] Date of Exam: 10/20/2021 Medical Rec #:  [id number]    Height:       75.0 in Accession #:    [id number]   Weight:       204.1 lb Date of Birth:  1986/03/05    BSA:          2.213 m Patient Age:    36 years     BP:           151/83 mmHg Patient Gender: M            HR:           95 bpm. Exam Location:  Inpatient Procedure: 2D Echo, Cardiac Doppler, Color Doppler and Intracardiac            Opacification Agent Indications:    NSTEMI  History:        Patient has no prior history of Echocardiogram examinations.                 Signs/Symptoms:Chest Pain.  Sonographer:    Merrie Roof RDCS Referring  Phys: [id number] [place] IMPRESSIONS  1. 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  2. Left ventricular ejection fraction, by estimation, is 55 to 60%. The left ventricle has normal function. The left ventricle has no regional wall motion abnormalities. The left ventricular internal cavity size was mildly dilated. There is mild left ventricular hypertrophy. Left ventricular diastolic parameters were normal.  3. Right ventricular systolic function is normal. The right ventricular size is mildly enlarged.  4. Left atrial size was moderately dilated.  5. Right atrial size was mildly dilated.  6. The mitral valve is normal in structure. Mild mitral valve regurgitation. No evidence of mitral stenosis. 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  7. The aortic valve is tricuspid. Aortic valve regurgitation is not visualized. No aortic stenosis is present.  8. The inferior vena cava is normal in size with greater than 50% respiratory variability, suggesting right atrial pressure of 3 mmHg. FINDINGS  Left Ventricle: Left ventricular ejection fraction, by estimation, is 55 to 60%. The left ventricle has normal function. The left ventricle has no regional wall motion abnormalities. Definity contrast agent was given IV to delineate the left ventricular  endocardial borders. The left ventricular internal cavity size was mildly dilated. There is mild left ventricular hypertrophy. Left ventricular diastolic parameters were normal. Right Ventricle: The right ventricular size is mildly enlarged. Right ventricular systolic function is normal. Left Atrium: Left atrial size was moderately dilated. Right Atrium: Right atrial size was mildly dilated. Pericardium: There is no evidence of pericardial effusion. Mitral Valve: The mitral valve is normal in structure. 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 Mild mitral valve  regurgitation. No evidence of mitral valve stenosis. Tricuspid Valve: The tricuspid valve is normal in structure. Tricuspid valve regurgitation is mild . No evidence of tricuspid stenosis.  Aortic Valve: The aortic valve is tricuspid. Aortic valve regurgitation is not visualized. No aortic stenosis is present. Aortic valve mean gradient measures 3.0 mmHg. Aortic valve peak gradient measures 6.2 mmHg. Aortic valve area, by VTI measures 2.79 cm. Pulmonic Valve: The pulmonic valve was normal in structure. Pulmonic valve regurgitation is trivial. No evidence of pulmonic stenosis. Aorta: The aortic root is normal in size and structure. Venous: The inferior vena cava is normal in size with greater than 50% respiratory variability, suggesting right atrial pressure of 3 mmHg. IAS/Shunts: No atrial level shunt detected by color flow Doppler. 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  LEFT VENTRICLE PLAX 2D LVIDd:         5.60 cm   Diastology LVIDs:         4.30 cm   LV e' medial:    11.20 cm/s LV PW:         1.20 cm   LV E/e' medial:  7.7 LV IVS:        1.10 cm   LV e' lateral:   12.00 cm/s LVOT diam:     2.10 cm   LV E/e' lateral: 7.2 LV SV:         57 LV SV Index:   26 LVOT Area:     3.46 cm  RIGHT VENTRICLE RV Basal diam:  4.70 cm RV Mid diam:    3.80 cm RV S prime:     13.60 cm/s TAPSE (M-mode): 3.2 cm LEFT ATRIUM           Index        RIGHT ATRIUM           Index LA diam:      4.50 cm 2.03 cm/m   RA Area:     23.60 cm LA Vol (A4C): 99.4 ml 44.91 ml/m  RA Volume:   77.50 ml  35.01 ml/m  AORTIC VALVE AV Area (Vmax):    3.10 cm AV Area (Vmean):   2.76 cm AV Area (VTI):     2.79 cm AV Vmax:           125.00 cm/s AV Vmean:          86.200 cm/s AV VTI:            0.206 m AV Peak Grad:      6.2 mmHg AV Mean Grad:      3.0 mmHg LVOT Vmax:         112.00 cm/s LVOT Vmean:        68.700 cm/s LVOT VTI:          0.166 m LVOT/AV VTI ratio: 0.81  AORTA Ao Root diam:  3.50 cm Ao Asc diam:  3.00 cm MITRAL VALVE MV Area (PHT): 6.96 cm       SHUNTS MV Decel Time: 109 msec       Systemic VTI:  0.17 m MR Peak grad:    102.8 mmHg   Systemic Diam: 2.10 cm MR Vmax:         507.00 cm/s MR PISA:         16.08 cm MR PISA Eff ROA: 96 mm MR PISA Radius:  1.60 cm MV E velocity: 86.50 cm/s MV A velocity: 51.90 cm/s MV E/A ratio:  1.67 Kirk Ruths MD Electronically signed by Kirk Ruths MD Signature Date/Time: 10/20/2021/3:10:20 PM    Final     Cardiac Studies   Echo from 10/20/21:  1. 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   2. Left ventricular ejection fraction, by estimation, is 55 to 60%. The  left ventricle has normal function. The left ventricle has no regional  wall motion abnormalities. The left ventricular internal cavity size was  mildly dilated. There is mild left  ventricular hypertrophy. Left ventricular diastolic parameters were  normal.   3. Right ventricular systolic function is normal. The right ventricular  size is mildly enlarged.   4. Left atrial size was moderately dilated.   5. Right atrial size was mildly dilated.   6. The mitral valve is normal in structure. Mild mitral valve  regurgitation. No evidence of mitral stenosis. There is moderate  holosystolic prolapse of both leaflets of the mitral valve.   7. The aortic valve is tricuspid. Aortic valve regurgitation is not  visualized. No aortic stenosis is present.   8. The inferior vena cava is normal in size with greater than 50%  respiratory variability, suggesting right atrial pressure of 3 mmHg.   Patient Profile     36 y.o. male with PMH of  untreated hypertension, who presented to the ER 10/19/2021 with chief complaints of chest pain radiating to back. [place] [id number]. EKG at admission with Subtle ST elevation of inferior leads.  Echo from 10/20/2021 with mild left MVP with probable mild MR, LVEF 55 to 60%, no regional  wall motion abnormality, mild LVH, normal diastolic parameter, normal RV, moderate LAE, mild RAE.  He is planned for cardiac catheterization.  Assessment & Plan    Non-STEMI -Presented with chest pain -High sensitive troponin 1953 [id number].  -EKG at admission with subtle ST elevation of inferior leads -Echocardiogram from 10/20/2021 with LVEF 55 to 60%, no WMA -Plan for cardiac catheterization today -Medical therapy: Started on aspirin 81 mg, Lipitor 80 mg metoprolol 50 mg twice daily; further adjustment pending cath results  Hypertension -Initial blood pressure elevated [id number] systolically, historically not taking medication -Started on metoprolol 50 mg twice daily here, BP improving now   Hyperthyroidism - reports hx of chronic anxiety, tremor, heat intolerance, weight loss, and heart palpitation since he was a teenager  - no exophthalmos, + Goiter on exam, + tachycardia  - TSH <0.01, FT4 elevated 4.55 this admission  - TPO Ab and TSI positive, added  hyroid ultrasound today, likely Grave's disease - will start Methimazole 10mg  TID today, repeat TSH and FT4 in 4-6 weeks, will need eventual ablative therapy, need PCP or Endocrinology established outpatient     For questions or updates, please contact [facility] Please consult [email or web address] for contact info under        Signed, Margie Billet, NP  10/22/2021, 8:38 AM     Personally seen and examined. Agree with APP above with the following comments:  Briefly 36 yo M with FHX  of CAD and HTN with NSTEMI and preserved EF  Patient notes being tired and woozy and things it is realted to hunger. No CP, No SOB, no Palpitations, syncope.  Exam notable for sinus tachycardia, +2 radial +2 femoral Tele: sinus tachycardia Personally reviewed relevant tests; FT4 and TPO Ab +  For key conditions including  NSTEMI HTN Grave's disease  Would recommend  - patient is amenable to LHC - if 3VD will have prolonged course -  If PCI plan for 10/23/21 discharge - if widely patent CORS will plan for CMR 10/23/21 and discharge that day - long discussion  with patient; he is amenable to stay  Rudean Haskell, MD Cardiologist [facility] [place], [address] [place], [place] [address] [phone]  1:10 PM

## 2021-10-23 ENCOUNTER — Other Ambulatory Visit (HOSPITAL_COMMUNITY): Payer: Self-pay

## 2021-10-23 ENCOUNTER — Inpatient Hospital Stay (HOSPITAL_COMMUNITY): Payer: Self-pay

## 2021-10-23 ENCOUNTER — Encounter (HOSPITAL_COMMUNITY): Payer: Self-pay | Admitting: Internal Medicine

## 2021-10-23 DIAGNOSIS — E0590 Thyrotoxicosis, unspecified without thyrotoxic crisis or storm: Secondary | ICD-10-CM

## 2021-10-23 DIAGNOSIS — I340 Nonrheumatic mitral (valve) insufficiency: Secondary | ICD-10-CM

## 2021-10-23 DIAGNOSIS — I361 Nonrheumatic tricuspid (valve) insufficiency: Secondary | ICD-10-CM

## 2021-10-23 LAB — CBC
HCT: 45.8 % (ref 39.0–52.0)
Hemoglobin: 16.1 g/dL (ref 13.0–17.0)
MCH: 27.7 pg (ref 26.0–34.0)
MCHC: 35.2 g/dL (ref 30.0–36.0)
MCV: 78.8 fL — ABNORMAL LOW (ref 80.0–100.0)
Platelets: 245 10*3/uL (ref 150–400)
RBC: 5.81 MIL/uL (ref 4.22–5.81)
RDW: 12.9 % (ref 11.5–15.5)
WBC: 6.7 10*3/uL (ref 4.0–10.5)
nRBC: 0 % (ref 0.0–0.2)

## 2021-10-23 LAB — BASIC METABOLIC PANEL
Anion gap: 10 (ref 5–15)
BUN: 12 mg/dL (ref 6–20)
CO2: 25 mmol/L (ref 22–32)
Calcium: 9.7 mg/dL (ref 8.9–10.3)
Chloride: 102 mmol/L (ref 98–111)
Creatinine, Ser: 0.63 mg/dL (ref 0.61–1.24)
GFR, Estimated: >60 mL/min (ref >60)
Glucose, Bld: 97 mg/dL (ref 70–99)
Potassium: 4 mmol/L (ref 3.5–5.1)
Sodium: 137 mmol/L (ref 135–145)

## 2021-10-23 LAB — THYROTROPIN RECEPTOR AUTOABS: Thyrotropin Receptor Ab: 25 IU/L — ABNORMAL HIGH (ref 0.00–1.75)

## 2021-10-23 MED ORDER — ATORVASTATIN CALCIUM 80 MG PO TABS
80.0000 mg | ORAL_TABLET | Freq: Every day | ORAL | 3 refills | Status: DC
  Filled 2021-10-23: qty 30, 30d supply, fill #0

## 2021-10-23 MED ORDER — METOPROLOL SUCCINATE ER 25 MG PO TB24
25.0000 mg | ORAL_TABLET | Freq: Every day | ORAL | 3 refills | Status: DC
  Filled 2021-10-23: qty 30, 30d supply, fill #0

## 2021-10-23 MED ORDER — METOPROLOL TARTRATE 50 MG PO TABS
50.0000 mg | ORAL_TABLET | Freq: Two times a day (BID) | ORAL | 3 refills | Status: DC
  Filled 2021-10-23: qty 180, 90d supply, fill #0

## 2021-10-23 MED ORDER — ASPIRIN 81 MG PO TBEC
81.0000 mg | DELAYED_RELEASE_TABLET | Freq: Every day | ORAL | 3 refills | Status: DC
  Filled 2021-10-23: qty 90, 90d supply, fill #0

## 2021-10-23 MED ORDER — NITROGLYCERIN 0.4 MG SL SUBL
0.4000 mg | SUBLINGUAL_TABLET | SUBLINGUAL | 12 refills | Status: DC | PRN
  Filled 2021-10-23: qty 25, 14d supply, fill #0

## 2021-10-23 MED ORDER — METHIMAZOLE 10 MG PO TABS
10.0000 mg | ORAL_TABLET | Freq: Three times a day (TID) | ORAL | 2 refills | Status: DC
  Filled 2021-10-23: qty 90, 30d supply, fill #0

## 2021-10-23 MED ORDER — GADOBUTROL 1 MMOL/ML IV SOLN
10.0000 mL | Freq: Once | INTRAVENOUS | Status: AC | PRN
  Administered 2021-10-23: 10 mL via INTRAVENOUS

## 2021-10-23 NOTE — Progress Notes (Addendum)
Progress Note  Patient Name: Aaron [name] Date of Encounter: 10/23/2021  [facility] Cardiologist: Christell Constant, MD   Subjective   Feeling well. No chest pain, sob or palpitations.    Inpatient Medications    Scheduled Meds:  aspirin EC  81 mg Oral Daily   atorvastatin  80 mg Oral Daily   enoxaparin (LOVENOX) injection  40 mg Subcutaneous Q24H   methimazole  10 mg Oral Q8H   metoprolol tartrate  50 mg Oral BID   sodium chloride flush  3 mL Intravenous Q12H   Continuous Infusions:  sodium chloride     PRN Meds: sodium chloride, acetaminophen, cyclobenzaprine, nitroGLYCERIN, ondansetron (ZOFRAN) IV, sodium chloride flush   Vital Signs    Vitals:   10/22/21 2133 10/22/21 2156 10/22/21 2256 10/23/21 0451  BP: 139/76 126/71 (!) 140/93 139/76  Pulse: 96 87 (!) 106 (!) 101  Resp:  18 17 20   Temp:    98.7 F (37.1 C)  TempSrc:    Oral  SpO2:  100% 99% 98%  Weight:    87 kg  Height:        Intake/Output Summary (Last 24 hours) at 10/23/2021 0831 Last data filed at 10/23/2021 0300 Gross per 24 hour  Intake 370.48 ml  Output --  Net 370.48 ml      10/23/2021    4:51 AM 10/22/2021    6:40 AM 10/21/2021    4:13 AM  Last 3 Weights  Weight (lbs) 191 lb 14.4 oz 193 lb 14.4 oz 196 lb 8 oz  Weight (kg) 87.045 kg 87.952 kg 89.132 kg      Telemetry    Sinus rhythm/tachycardia - Personally Reviewed  ECG    N/A  Physical Exam   GEN: No acute distress.   Neck: No JVD Cardiac: RRR, no murmurs, rubs, or gallops. Right radial without hematoma.  Respiratory: Clear to auscultation bilaterally. GI: Soft, nontender, non-distended  MS: No edema; No deformity. Neuro:  Nonfocal  Psych: Normal affect   Labs    High Sensitivity Troponin:   Recent Labs  Lab 10/19/21 1933 10/20/21 0107 10/20/21 0337 10/20/21 1056 10/20/21 1828  TROPONINIHS 1,952* 6,964* 11,164* 7,988* 4,986*     Chemistry Recent Labs  Lab 10/20/21 0107 10/21/21 0058 10/22/21 0048  10/23/21 0204  NA 139 137 136 137  K 3.3* 3.7 4.8 4.0  CL 107 106 105 102  CO2 25 24 23 25   GLUCOSE 131* 128* 100* 97  BUN 5* 6 8 12   CREATININE 0.63 0.60* 0.65 0.63  CALCIUM 9.2 9.2 9.7 9.7  MG 1.4*  --   --   --   GFRNONAA >60 >60 >60 >60  ANIONGAP 7 7 8 10     Lipids  Recent Labs  Lab 10/20/21 0107  CHOL 103  TRIG 25  HDL 33*  LDLCALC 65  CHOLHDL 3.1    Hematology Recent Labs  Lab 10/21/21 0058 10/22/21 0048 10/23/21 0204  WBC 8.1 8.7 6.7  RBC 5.36 5.82* 5.81  HGB 15.1 16.5 16.1  HCT 42.3 45.7 45.8  MCV 78.9* 78.5* 78.8*  MCH 28.2 28.4 27.7  MCHC 35.7 36.1* 35.2  RDW 13.1 13.2 12.9  PLT 246 254 245   Thyroid  Recent Labs  Lab 10/19/21 1759 10/19/21 1822  TSH <0.010*  --   FREET4  --  4.55*    BNP Recent Labs  Lab 10/20/21 0107  BNP 353.6*    DDimer  Recent Labs  Lab 10/19/21 1822  DDIMER 0.32     Radiology    CARDIAC CATHETERIZATION  Result Date: 10/22/2021 Conclusions: No angiographically significant coronary artery disease. Normal left ventricular filling pressure (LVEDP ~15 mmHg). Question left ventricular outflow versus intracavitary gradient (~45 mmHg). Recommendations: No coronary artery disease to explain elevated troponin.  Agree with plan for cardiac MRI tomorrow to assess for myocarditis and hypertrophic cardiomyopathy. Primary prevention of coronary artery disease. Yvonne Kendallhristopher End, MD [facility]  US THYROID  Result Date: 10/22/2021 CLINICAL DATA:  36 year old male with a history of hyperthyroidism EXAM: THYROID ULTRASOUND TECHNIQUE: Ultrasound examination of the thyroid gland and adjacent soft tissues was performed. COMPARISON:  None Available. FINDINGS: Parenchymal Echotexture: Moderately heterogenous Isthmus: 1.0 cm Right lobe: 6.9 cm x 3.0 cm x 3.8 cm Left lobe: 7.3 cm x 3.2 cm x 2.9 cm _________________________________________________________ Estimated total number of nodules >/= 1 cm: 1 Number of spongiform nodules >/=  2 cm  not described below (TR1): 0 Number of mixed cystic and solid nodules >/= 1.5 cm not described below (TR2): 0 _________________________________________________________ Nodule # 1: Location: Right; Superior Maximum size: 1.0 cm; Other 2 dimensions: 0.4 cm x 0.8 cm Composition: cystic/almost completely cystic (0) Echogenicity: anechoic (0) Shape: not taller-than-wide (0) Margins: smooth (0) Echogenic foci: none (0) ACR TI-RADS total points: 0. ACR TI-RADS risk category: TR1 (0-1 points). ACR TI-RADS recommendations: Cystic nodule does not meet criteria for surveillance or biopsy _________________________________________________________ Nodule # 2: Location: Isthmus; Inferior Maximum size: 1.4 cm; Other 2 dimensions: 1.2 cm x 1.4 cm Composition: solid/almost completely solid (2) Echogenicity: hyperechoic (1) Shape: not taller-than-wide (0) Margins: ill-defined (0) Echogenic foci: none (0) ACR TI-RADS total points: 3. ACR TI-RADS risk category: TR3 (3 points). ACR TI-RADS recommendations: Nodule does not meet criteria for surveillance _________________________________________________________ Left-sided small cystic nodule does not meet criteria for surveillance. No adenopathy No thyroid nodule meets criteria for biopsy or surveillance, as designated by the newly established ACR TI-RADS criteria. Recommendations follow those established by the new ACR TI-RADS criteria (J Am Coll Radiol 2017;14:587-595). IMPRESSION: Heterogeneous enlarged thyroid compatible with medical thyroid disease. Small benign-appearing thyroid nodules as above. Electronically Signed   By: Gilmer MorJaime  Wagner D.O.   On: 10/22/2021 15:17    Cardiac Studies   LEFT HEART CATH AND CORONARY ANGIOGRAPHY  10/22/21   Conclusion  Conclusions: No angiographically significant coronary artery disease. Normal left ventricular filling pressure (LVEDP ~15 mmHg). Question left ventricular outflow versus intracavitary gradient (~45 mmHg).    Recommendations: No coronary artery disease to explain elevated troponin.  Agree with plan for cardiac MRI tomorrow to assess for myocarditis and hypertrophic cardiomyopathy. Primary prevention of coronary artery disease.   Echo 10/20/21  1. 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   2. Left ventricular ejection fraction, by estimation, is 55 to 60%. The  left ventricle has normal function. The left ventricle has no regional  wall motion abnormalities. The left ventricular internal cavity size was  mildly dilated. There is mild left  ventricular hypertrophy. Left ventricular diastolic parameters were  normal.   3. Right ventricular systolic function is normal. The right ventricular  size is mildly enlarged.   4. Left atrial size was moderately dilated.   5. Right atrial size was mildly dilated.   6. The mitral valve is normal in structure. Mild mitral valve  regurgitation. No evidence of mitral stenosis. There is moderate  holosystolic prolapse of both leaflets of the mitral valve.   7. The aortic valve  is tricuspid. Aortic valve regurgitation is not  visualized. No aortic stenosis is present.   8. The inferior vena cava is normal in size with greater than 50%  respiratory variability, suggesting right atrial pressure of 3 mmHg.   Patient Profile     36 y.o. male with PMH of  untreated hypertension, who presented to the ER 10/19/2021 with chief complaints of chest pain radiating to back and found to have elevated troponin.  Assessment & Plan    1.  Non-STEMI Troponin peaked greater than 11,000.  Treated with IV heparin.  EKG reveals septal ST elevation in inferior lead.  Echocardiogram showed LV function of 55 to 60% without regional wall motion abnormality.  Underwent cardiac catheterization showing no evidence of CAD.  Underwent cardiac MRI this morning, pending result.  Continue aspirin, statin and beta-blocker.  Medication recommendation  based on cMRI result.  2.  Hypertension -Longstanding history but untreated -Blood pressure now improved on metoprolol tartrate 50 mg twice daily  3.  Hyperthyroidism -reports hx of chronic anxiety, tremor, heat intolerance, weight loss, and heart palpitation since he was a teenager  -TSH <0.01, FT4 elevated 4.55 this admission  - TPO Ab and TSI positive -Thyroid ultrasound with heterogeneous enlarged thyroid compatible with medical thyroid disease. Small benign-appearing thyroid nodules as above. -Started on methimazole 10 mg 3 times daily this admission.  Will need repeat labs in 4 to 6 weeks with follow-up with PCP/endocrinologist.  Transition of care team on board.  No insurance or PCP.  For questions or updates, please contact [facility] Please consult [email or web address] for contact info under        SignedManson Passey, PA  10/23/2021, 8:31 AM     Personally seen and examined. Agree with APP above with the following comments:  36 yo M with NSTEMI No symptoms presently, eager to go home. CMR has been reviewed: though no evidence of proximal or mid obstructive disease, there was a small apical inferior scar; suggestive of small MI vs embolic event.  No dissection flap seen consistent with SCAD. - Lvef 48% potentially related to his thyroid disease. Discussed with patient: will start ASA, metoprolol succinate 25 mg Po daily, and continue methimazole - we will need to arrange PCP, Endo, and cardiology f/u - if we can safely do this we will plan for discharge today, and outpatient evaluation for ARB and statin  Riley Lam, MD Cardiologist Hypertrophic Cardiomyopathy [facility]  [address] [place], [place] [address] [phone]  2:06 PM

## 2021-10-23 NOTE — Progress Notes (Signed)
Pt safely discharged. Discharge packet provided with teach-back method. VS wnL and as per flow. IVs removed, Pt verbalized understanding. All questions and concerns addressed. Wife here for transport.

## 2021-10-23 NOTE — Progress Notes (Signed)
Pt provided with Mychart app education. Pt downloaded app and created a username/pw. Awaiting on provider for a return to work note via Clinical cytogeneticist. Providers aware. All questions and concerns addressed. Pt discharged.

## 2021-10-23 NOTE — Discharge Summary (Addendum)
Discharge Summary    Patient ID: Aaron [name] MRN: [id number]; DOB: 1985/07/02  Admit date: 10/19/2021 Discharge date: 10/23/2021  PCP:  Patient, No Pcp Per (Inactive)   [facility] Providers Cardiologist:  Christell Constant, MD        Discharge Diagnoses    Principal Problem:   NSTEMI (non-ST elevated myocardial infarction) [facility]) Active Problems:   Elevated troponin   Hyperthyroidism   HTN  Diagnostic Studies/Procedures    cMRI : Pending final reading.  small scar on his heart, LVEF 48%   LEFT HEART CATH AND CORONARY ANGIOGRAPHY  10/22/21    Conclusion   Conclusions: No angiographically significant coronary artery disease. Normal left ventricular filling pressure (LVEDP ~15 mmHg). Question left ventricular outflow versus intracavitary gradient (~45 mmHg).   Recommendations: No coronary artery disease to explain elevated troponin.  Agree with plan for cardiac MRI tomorrow to assess for myocarditis and hypertrophic cardiomyopathy. Primary prevention of coronary artery disease.   Echo 10/20/21  1. 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   2. Left ventricular ejection fraction, by estimation, is 55 to 60%. The  left ventricle has normal function. The left ventricle has no regional  wall motion abnormalities. The left ventricular internal cavity size was  mildly dilated. There is mild left  ventricular hypertrophy. Left ventricular diastolic parameters were  normal.   3. Right ventricular systolic function is normal. The right ventricular  size is mildly enlarged.   4. Left atrial size was moderately dilated.   5. Right atrial size was mildly dilated.   6. The mitral valve is normal in structure. Mild mitral valve  regurgitation. No evidence of mitral stenosis. There is moderate  holosystolic prolapse of both leaflets of the mitral valve.   7. The aortic valve is tricuspid. Aortic valve regurgitation is not   visualized. No aortic stenosis is present.   8. The inferior vena cava is normal in size with greater than 50%  respiratory variability, suggesting right atrial pressure of 3 mmHg.     History of Present Illness     Aaron [name] is a 36 y.o. male with PMH of  untreated hypertension, who presented to the ER 10/19/2021 with chief complaints of chest pain radiating to back and found to have elevated troponin.  Aaron [name] is fairly healthy, reports BP elevated although unsure how high previously, otherwise not prescribed any medications. Aunt with early heart disease, father with heart disease in 70s. No tobacco, no cocaine/meth, no OTCs. Does note longstanding elevated heart rate, sweats, tremor. Intermittent chest pain left sided sharp radiating to back up to hours several times in the past. 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 Presented to ED. HR 100s, BP 150s-160s. EKG with LVH/early repolarization changes and sinus tachycardia. Troponin 1200 -> 1900. TSH undetectably low (never previously checked, does have Fhx of thyroid disease). Other labs normal. Started on NTG gtt and heparin with improved symptoms.   Hospital Course     Consultants: None   1.  Non-STEMI Troponin peaked greater than 11,000.  Treated with IV heparin.  EKG reveals septal ST elevation in inferior lead.  Echocardiogram showed LV function of 55 to 60% without regional wall motion abnormality.  Underwent cardiac catheterization showing no evidence of CAD. Underwent cardiac MRI, pending result>> review showed apical scar and LVEF 48%. Continue aspirin  and beta-blocker.  LDL 65. Could consider adding statin if he follow up.    2.  Hypertension -Longstanding history but untreated -Blood pressure now improved on metoprolol tartrate 50 mg twice daily>> changed to Toprol XL 25mg  qd at discharge.    3.   Hyperthyroidism -reports hx of chronic anxiety, tremor, heat intolerance, weight loss, and heart palpitation since he was a teenager  -TSH <0.01, FT4 elevated 4.55 this admission  - TPO Ab and TSI positive -Thyroid ultrasound with heterogeneous enlarged thyroid compatible with medical thyroid disease. Small benign-appearing thyroid nodules as above. -Started on methimazole 10 mg 3 times daily this admission.  Will need repeat labs in 4 to 6 weeks with follow-up with PCP (arranged next week at Internal medicine clinic)/endocrinologist (referral made).   Patient does not have insurance. Will need help at Internal Medicine clinic.   Did the patient have an acute coronary syndrome (MI, NSTEMI, STEMI, etc) this admission?:  Yes                               AHA/ACC Clinical Performance & Quality Measures: Aspirin prescribed? - Yes ADP Receptor Inhibitor (Plavix/Clopidogrel, Brilinta/Ticagrelor or Effient/Prasugrel) prescribed (includes medically managed patients)? - No - no PCI Beta Blocker prescribed? - Yes High Intensity Statin (Lipitor 40-80mg  or Crestor 20-40mg ) prescribed? - No - Patient not interested  EF assessed during THIS hospitalization? - Yes For EF <40%, was ACEI/ARB prescribed? - Not Applicable (EF >/= 40%) For EF <40%, Aldosterone Antagonist (Spironolactone or Eplerenone) prescribed? - Not Applicable (EF >/= 40%) Cardiac Rehab Phase II ordered (including medically managed patients)? - No - no PCI    Discharge Vitals Blood pressure (!) 144/96, pulse 95, temperature 98.9 F (37.2 C), temperature source Oral, resp. rate 17, height 6\' 3"  (1.905 m), weight 87 kg, SpO2 100 %.  Filed Weights   10/21/21 0413 10/22/21 0640 10/23/21 0451  Weight: 89.1 kg 88 kg 87 kg    Labs & Radiologic Studies    CBC Recent Labs    10/22/21 0048 10/23/21 0204  WBC 8.7 6.7  HGB 16.5 16.1  HCT 45.7 45.8  MCV 78.5* 78.8*  PLT 254 245   Basic Metabolic Panel Recent Labs    56/21/3005/05/04 0048  10/23/21 0204  NA 136 137  K 4.8 4.0  CL 105 102  CO2 23 25  GLUCOSE 100* 97  BUN 8 12  CREATININE 0.65 0.63  CALCIUM 9.7 9.7   High Sensitivity Troponin:   Recent Labs  Lab 10/19/21 1933 10/20/21 0107 10/20/21 0337 10/20/21 1056 10/20/21 1828  TROPONINIHS 1,952* 6,964* 11,164* 7,988* 4,986*    _____________  CARDIAC CATHETERIZATION  Result Date: 10/22/2021 Conclusions: No angiographically significant coronary artery disease. Normal left ventricular filling pressure (LVEDP ~15 mmHg). Question left ventricular outflow versus intracavitary gradient (~45 mmHg). Recommendations: No coronary artery disease to explain elevated troponin.  Agree with plan for cardiac MRI tomorrow to assess for myocarditis and hypertrophic cardiomyopathy. Primary prevention of coronary artery disease. Yvonne Kendallhristopher End, MD [facility]  US THYROID  Result Date: 10/22/2021 CLINICAL DATA:  36 year old male with a history of hyperthyroidism EXAM: THYROID ULTRASOUND TECHNIQUE: Ultrasound examination of the thyroid gland and adjacent soft tissues was performed. COMPARISON:  None Available. FINDINGS: Parenchymal Echotexture: Moderately heterogenous Isthmus: 1.0 cm Right lobe: 6.9 cm x 3.0 cm x 3.8 cm Left lobe: 7.3 cm x 3.2 cm x 2.9 cm _________________________________________________________ Estimated total number of nodules >/= 1  cm: 1 Number of spongiform nodules >/=  2 cm not described below (TR1): 0 Number of mixed cystic and solid nodules >/= 1.5 cm not described below (TR2): 0 _________________________________________________________ Nodule # 1: Location: Right; Superior Maximum size: 1.0 cm; Other 2 dimensions: 0.4 cm x 0.8 cm Composition: cystic/almost completely cystic (0) Echogenicity: anechoic (0) Shape: not taller-than-wide (0) Margins: smooth (0) Echogenic foci: none (0) ACR TI-RADS total points: 0. ACR TI-RADS risk category: TR1 (0-1 points). ACR TI-RADS recommendations: Cystic nodule does not meet  criteria for surveillance or biopsy _________________________________________________________ Nodule # 2: Location: Isthmus; Inferior Maximum size: 1.4 cm; Other 2 dimensions: 1.2 cm x 1.4 cm Composition: solid/almost completely solid (2) Echogenicity: hyperechoic (1) Shape: not taller-than-wide (0) Margins: ill-defined (0) Echogenic foci: none (0) ACR TI-RADS total points: 3. ACR TI-RADS risk category: TR3 (3 points). ACR TI-RADS recommendations: Nodule does not meet criteria for surveillance _________________________________________________________ Left-sided small cystic nodule does not meet criteria for surveillance. No adenopathy No thyroid nodule meets criteria for biopsy or surveillance, as designated by the newly established ACR TI-RADS criteria. Recommendations follow those established by the new ACR TI-RADS criteria (J Am Coll Radiol 2017;14:587-595). IMPRESSION: Heterogeneous enlarged thyroid compatible with medical thyroid disease. Small benign-appearing thyroid nodules as above. Electronically Signed   By: Gilmer Mor D.O.   On: 10/22/2021 15:17   ECHOCARDIOGRAM COMPLETE  Result Date: 10/20/2021    ECHOCARDIOGRAM REPORT   Patient Name:   Aaron [name] Date of Exam: 10/20/2021 Medical Rec #:  [id number]    Height:       75.0 in Accession #:    [id number]   Weight:       204.1 lb Date of Birth:  09-03-1985    BSA:          2.213 m Patient Age:    35 years     BP:           151/83 mmHg Patient Gender: M            HR:           95 bpm. Exam Location:  Inpatient Procedure: 2D Echo, Cardiac Doppler, Color Doppler and Intracardiac            Opacification Agent Indications:    NSTEMI  History:        Patient has no prior history of Echocardiogram examinations.                 Signs/Symptoms:Chest Pain.  Sonographer:    Roosvelt Maser RDCS Referring Phys: [id number] [place] IMPRESSIONS  1. 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  2. Left ventricular ejection  fraction, by estimation, is 55 to 60%. The left ventricle has normal function. The left ventricle has no regional wall motion abnormalities. The left ventricular internal cavity size was mildly dilated. There is mild left ventricular hypertrophy. Left ventricular diastolic parameters were normal.  3. Right ventricular systolic function is normal. The right ventricular size is mildly enlarged.  4. Left atrial size was moderately dilated.  5. Right atrial size was mildly dilated.  6. The mitral valve is normal in structure. Mild mitral valve regurgitation. No evidence of mitral stenosis. 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  7. The aortic valve is tricuspid. Aortic valve regurgitation is not visualized. No aortic stenosis is present.  8. The inferior vena cava is normal in size with greater than 50% respiratory variability, suggesting right  atrial pressure of 3 mmHg. FINDINGS  Left Ventricle: Left ventricular ejection fraction, by estimation, is 55 to 60%. The left ventricle has normal function. The left ventricle has no regional wall motion abnormalities. Definity contrast agent was given IV to delineate the left ventricular  endocardial borders. The left ventricular internal cavity size was mildly dilated. There is mild left ventricular hypertrophy. Left ventricular diastolic parameters were normal. Right Ventricle: The right ventricular size is mildly enlarged. Right ventricular systolic function is normal. Left Atrium: Left atrial size was moderately dilated. Right Atrium: Right atrial size was mildly dilated. Pericardium: There is no evidence of pericardial effusion. Mitral Valve: The mitral valve is normal in structure. 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 Mild mitral valve regurgitation. No evidence of mitral valve stenosis. Tricuspid Valve: The tricuspid valve is normal in structure. Tricuspid valve regurgitation is mild . No  evidence of tricuspid stenosis. Aortic Valve: The aortic valve is tricuspid. Aortic valve regurgitation is not visualized. No aortic stenosis is present. Aortic valve mean gradient measures 3.0 mmHg. Aortic valve peak gradient measures 6.2 mmHg. Aortic valve area, by VTI measures 2.79 cm. Pulmonic Valve: The pulmonic valve was normal in structure. Pulmonic valve regurgitation is trivial. No evidence of pulmonic stenosis. Aorta: The aortic root is normal in size and structure. Venous: The inferior vena cava is normal in size with greater than 50% respiratory variability, suggesting right atrial pressure of 3 mmHg. IAS/Shunts: No atrial level shunt detected by color flow Doppler. 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  LEFT VENTRICLE PLAX 2D LVIDd:         5.60 cm   Diastology LVIDs:         4.30 cm   LV e' medial:    11.20 cm/s LV PW:         1.20 cm   LV E/e' medial:  7.7 LV IVS:        1.10 cm   LV e' lateral:   12.00 cm/s LVOT diam:     2.10 cm   LV E/e' lateral: 7.2 LV SV:         57 LV SV Index:   26 LVOT Area:     3.46 cm  RIGHT VENTRICLE RV Basal diam:  4.70 cm RV Mid diam:    3.80 cm RV S prime:     13.60 cm/s TAPSE (M-mode): 3.2 cm LEFT ATRIUM           Index        RIGHT ATRIUM           Index LA diam:      4.50 cm 2.03 cm/m   RA Area:     23.60 cm LA Vol (A4C): 99.4 ml 44.91 ml/m  RA Volume:   77.50 ml  35.01 ml/m  AORTIC VALVE AV Area (Vmax):    3.10 cm AV Area (Vmean):   2.76 cm AV Area (VTI):     2.79 cm AV Vmax:           125.00 cm/s AV Vmean:          86.200 cm/s AV VTI:            0.206 m AV Peak Grad:      6.2 mmHg AV Mean Grad:      3.0 mmHg LVOT Vmax:         112.00 cm/s LVOT Vmean:  68.700 cm/s LVOT VTI:          0.166 m LVOT/AV VTI ratio: 0.81  AORTA Ao Root diam: 3.50 cm Ao Asc diam:  3.00 cm MITRAL VALVE MV Area (PHT): 6.96 cm       SHUNTS MV Decel Time: 109 msec       Systemic VTI:  0.17 m MR Peak grad:    102.8  mmHg   Systemic Diam: 2.10 cm MR Vmax:         507.00 cm/s MR PISA:         16.08 cm MR PISA Eff ROA: 96 mm MR PISA Radius:  1.60 cm MV E velocity: 86.50 cm/s MV A velocity: 51.90 cm/s MV E/A ratio:  1.67 Olga Millers MD Electronically signed by Olga Millers MD Signature Date/Time: 10/20/2021/3:10:20 PM    Final    DG Chest 2 View  Result Date: 10/19/2021 CLINICAL DATA:  Chest pain. EXAM: CHEST - 2 VIEW COMPARISON:  Chest x-ray 11/06/2007 FINDINGS: The heart size and mediastinal contours are within normal limits. Both lungs are clear. The visualized skeletal structures are unremarkable. IMPRESSION: No active cardiopulmonary disease. Electronically Signed   By: Darliss Cheney M.D.   On: 10/19/2021 18:00    Disposition   Pt is being discharged home today in good condition.  Follow-up Plans & Appointments     Follow-up Information     Christell Constant, MD Follow up on 12/03/2021.   Specialty: Cardiology Why: :40 am for hospital follow up. Please arrive 15 minutes early Contact information: [address] [place] [address] [phone]         [facility] Follow up.   Specialty: Endocrinology Why: referal has been place. office will call with date and time of appointment Contact information: [address] [place] [address] [phone]               Discharge Instructions     Ambulatory referral to Endocrinology   Complete by: As directed    Diet - low sodium heart healthy   Complete by: As directed    Discharge instructions   Complete by: As directed    No driving for 48 hours. No lifting over 5 lbs for 1 week. No sexual activity for 1 week. You may return to work on 10/27/21. Keep procedure site clean & dry. If you notice increased pain, swelling, bleeding or pus, call/return!  You may shower, but no soaking baths/hot tubs/pools for 1 week.   Increase activity slowly   Complete by: As directed         Discharge Medications   Allergies as of 10/23/2021   No Known Allergies      Medication List     STOP taking these medications    cyclobenzaprine 5 MG tablet Commonly known as: FLEXERIL   naproxen 500 MG tablet Commonly known as: NAPROSYN       TAKE these medications    aspirin EC 81 MG tablet Take 1 tablet (81 mg total) by mouth daily. Swallow whole. Start taking on: October 24, 2021   methimazole 10 MG tablet Commonly known as: TAPAZOLE Take 1 tablet (10 mg total) by mouth every 8 (eight) hours.   metoprolol succinate 25 MG 24 hr tablet Commonly known as: Toprol XL Take 1 tablet (25 mg total) by mouth daily.   nitroGLYCERIN 0.4 MG SL tablet Commonly known as: NITROSTAT Place 1 tablet (0.4 mg total) under the tongue  every 5 (five) minutes x 3 doses as needed for chest pain.        Outstanding Labs/Studies   Thyroid function labs in 4-6 weeks by PCP/Endocrinologist   Duration of Discharge Encounter   Greater than 30 minutes including physician time.  Vonzella Nipple [place], PA 10/23/2021, 2:07 PM  Agree with above.  Patient has evidence of a small apical inferior transmural infarct. Reduced LV function 48% and evidence of hyperthyroidism. I have offer him to say here, work out insurance issues (has no insurance), see how he tolerates new medications, and consider ARB, plavix, and GDMT.  He is unsure if he will even follow up with [place] and would prefer to be discharged.  At last discussion he is amenable to outpatient follow up.  Will eval statin therapy and ARB at that eval.  Suspect high output failure.  Riley Lam, MD Cardiologist Hypertrophic Cardiomyopathy [facility]  [address] [place], [address] [place], [place] [address] [phone]  6:00 PM

## 2021-10-26 ENCOUNTER — Telehealth: Payer: Self-pay | Admitting: Cardiology

## 2021-10-26 NOTE — Telephone Encounter (Signed)
New Message:     Patient says he needs a letter for work. He was discharged from the hospital on 10-23-21 and is supposed to return to work on 10-31-21 please.

## 2021-10-26 NOTE — Telephone Encounter (Signed)
Left message to return call 

## 2021-10-26 NOTE — Telephone Encounter (Signed)
Spoke with patient and provided a return to work letter via My Chart.

## 2021-10-30 LAB — T3, FREE: T3, Free: 19.8 pg/mL — ABNORMAL HIGH (ref 2.0–4.4)

## 2021-10-31 ENCOUNTER — Ambulatory Visit: Payer: Self-pay | Admitting: Student

## 2021-10-31 ENCOUNTER — Telehealth: Payer: Self-pay

## 2021-10-31 DIAGNOSIS — E0590 Thyrotoxicosis, unspecified without thyrotoxic crisis or storm: Secondary | ICD-10-CM

## 2021-10-31 NOTE — Telephone Encounter (Signed)
-----   Message from Werner Lean, MD sent at 10/31/2021 11:05 AM EDT ----- Patient has elevated T3; we found hyperthyroidism on his NSTEMI admission. He needs endocrine and PCP follow up.  Thanks, MAC   ----- Message ----- From: Constance Haw, MD Sent: 10/30/2021   4:44 PM EDT To: Werner Lean, MD   ----- Message ----- From: Buel Ream, [facility] [place] Sent: 10/26/2021   9:33 AM EDT To: Will Meredith Leeds, MD

## 2021-10-31 NOTE — Telephone Encounter (Signed)
The patient has been notified of the result and verbalized understanding.  All questions (if any) were answered. Theresia Majors, RN 10/31/2021 11:56 AM  Referral has been placed to endocrinology. Patient does not have a PCP but given information to help find one.

## 2021-11-19 ENCOUNTER — Encounter: Payer: Self-pay | Admitting: Internal Medicine

## 2021-11-30 NOTE — Progress Notes (Deleted)
Cardiology Office Note:    Date:  11/30/2021   ID:  Aaron [name], DOB [birth year], MRN [id number]  PCP:  Patient, No Pcp Per   [facility] Providers Cardiologist:  Christell Constant, MD { Click to update primary MD,subspecialty MD or APP then REFRESH:1}    Referring MD: No ref. provider found   CC: hospital follow up  History of Present Illness:    Aaron [name] is a 36 y.o. male with a hx of HTN and small apical MI, new Hyperthyroidism and HFmrEF.   Seen 10/2021: started on methimazole, started on DAPT, started on BB and ARB.  Did not go to his PCP appointment.    Patient notes that he is doing ***.   Since last visit notes *** . There are no*** interval hospital/ED visit.    No chest pain or pressure ***.  No SOB/DOE*** and no PND/Orthopnea***.  No weight gain or leg swelling***.  No palpitations or syncope ***.  Ambulatory blood pressure ***.   Past Medical History:  Diagnosis Date   Medical history non-contributory     Past Surgical History:  Procedure Laterality Date   LEFT HEART CATH AND CORONARY ANGIOGRAPHY N/A 10/22/2021   Procedure: LEFT HEART CATH AND CORONARY ANGIOGRAPHY;  Surgeon: Yvonne Kendall, MD;  Location: [facility] INVASIVE CV LAB;  Service: Cardiovascular;  Laterality: N/A;   NO PAST SURGERIES      Current Medications: No outpatient medications have been marked as taking for the 12/03/21 encounter (Appointment) with Christell Constant, MD.     Allergies:   Patient has no known allergies.   Social History   Socioeconomic History   Marital status: Single    Spouse name: Not on file   Number of children: Not on file   Years of education: Not on file   Highest education level: Not on file  Occupational History   Not on file  Tobacco Use   Smoking status: Never   Smokeless tobacco: Never  Vaping Use   Vaping Use: Never used  Substance and Sexual Activity   Alcohol use: No   Drug use: Yes    Frequency: 3.0 times per week     Types: Marijuana   Sexual activity: Never  Other Topics Concern   Not on file  Social History Narrative   Not on file   Social Determinants of Health   Financial Resource Strain: Not on file  Food Insecurity: Not on file  Transportation Needs: Not on file  Physical Activity: Not on file  Stress: Not on file  Social Connections: Not on file     Family History: History of coronary artery disease notable for ***. History of heart failure notable for ***. History of arrhythmia notable for ***. Denies family history of sudden cardiac death including drowning, car accidents, or unexplained deaths in the family.*** No history of bicuspid aortic valve or aortic aneurysm or dissection.  *** No history of cardiomyopathies including hypertrophic cardiomyopathy, left ventricular non-compaction, or arrhythmogenic right ventricular cardiomyopathy.***   ROS:   Please see the history of present illness.    *** All other systems reviewed and are negative.  EKGs/Labs/Other Studies Reviewed:    The following studies were reviewed today: ***  EKG:  EKG is *** ordered today.  The ekg ordered today demonstrates ***  Recent Labs: 10/19/2021: TSH <0.010 10/20/2021: B Natriuretic Peptide 353.6; Magnesium 1.4 10/23/2021: BUN 12; Creatinine, Ser 0.63; Hemoglobin 16.1; Platelets 245; Potassium 4.0; Sodium 137  Recent Lipid  Panel    Component Value Date/Time   CHOL 103 10/20/2021 0107   TRIG 25 10/20/2021 0107   HDL 33 (L) 10/20/2021 0107   CHOLHDL 3.1 10/20/2021 0107   VLDL 5 10/20/2021 0107   LDLCALC 65 10/20/2021 0107     Risk Assessment/Calculations:   {Does this patient have ATRIAL FIBRILLATION?:[id number]}       Physical Exam:    VS:  There were no vitals taken for this visit.    Wt Readings from Last 3 Encounters:  10/23/21 191 lb 14.4 oz (87 kg)  08/29/16 215 lb (97.5 kg)     GEN: *** Well nourished, well developed in no acute distress HEENT: Normal NECK: No JVD; No  carotid bruits LYMPHATICS: No lymphadenopathy CARDIAC: ***RRR, no murmurs, rubs, gallops RESPIRATORY:  Clear to auscultation without rales, wheezing or rhonchi  ABDOMEN: Soft, non-tender, non-distended MUSCULOSKELETAL:  No edema; No deformity  SKIN: Warm and dry NEUROLOGIC:  Alert and oriented x 3 PSYCHIATRIC:  Normal affect   ASSESSMENT:    No diagnosis found. 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 stress test, cath, DCCV, TEE, etc)?   Press F2        :[id number]}    Medication Adjustments/Labs and Tests Ordered: Current medicines are reviewed at length with the patient today.  Concerns regarding medicines are outlined above.  No orders of the defined types were placed in this encounter.  No orders of the defined types were placed in this encounter.   There are no Patient Instructions on file for this visit.   Signed, Christell Constant, MD  11/30/2021 8:28 AM    [facility]

## 2021-12-03 ENCOUNTER — Ambulatory Visit: Payer: Self-pay | Admitting: Internal Medicine

## 2021-12-04 ENCOUNTER — Telehealth: Payer: Self-pay | Admitting: Internal Medicine

## 2021-12-04 NOTE — Telephone Encounter (Signed)
States that they were returning a call. Please advise

## 2021-12-04 NOTE — Telephone Encounter (Signed)
Called number on file, was told pt is at home.  Asked that pt call [facility].

## 2021-12-19 NOTE — Telephone Encounter (Signed)
Left a message to call back.

## 2021-12-26 NOTE — Telephone Encounter (Signed)
Called pt back in regards to call from 3 weeks ago.  Pt answered advised needs to schedule an OV.  Scheduled for 01/07/22 at 9:40 am.

## 2022-01-05 NOTE — Progress Notes (Deleted)
Cardiology Office Note:    Date:  01/05/2022   ID:  Aaron [name], DOB 1985/11/11, MRN [id number]  PCP:  Patient, No Pcp Per   [facility] Providers Cardiologist:  Christell Constant, MD { Click to update primary MD,subspecialty MD or APP then REFRESH:1}    Referring MD: No ref. provider found   CC: *** hospital follow up  History of Present Illness:    Aaron [name] is a 36 y.o. male with a hx of CAD (small embolization of distal LAD seen by CMR), HTN, new hyperthyroidism seen for NSTEMI 10/2021.  We scheduled follow up both with [place] and with a PCP.    Patient notes that he is doing ***.   Since last visit notes *** . There are no*** interval hospital/ED visit.    No chest pain or pressure ***.  No SOB/DOE*** and no PND/Orthopnea***.  No weight gain or leg swelling***.  No palpitations or syncope ***.  Ambulatory blood pressure ***.   Past Medical History:  Diagnosis Date   Medical history non-contributory     Past Surgical History:  Procedure Laterality Date   LEFT HEART CATH AND CORONARY ANGIOGRAPHY N/A 10/22/2021   Procedure: LEFT HEART CATH AND CORONARY ANGIOGRAPHY;  Surgeon: Yvonne Kendall, MD;  Location: [facility];  Service: Cardiovascular;  Laterality: N/A;   NO PAST SURGERIES      Current Medications: No outpatient medications have been marked as taking for the 01/07/22 encounter (Appointment) with Christell Constant, MD.     Allergies:   Patient has no known allergies.   Social History   Socioeconomic History   Marital status: Single    Spouse name: Not on file   Number of children: Not on file   Years of education: Not on file   Highest education level: Not on file  Occupational History   Not on file  Tobacco Use   Smoking status: Never   Smokeless tobacco: Never  Vaping Use   Vaping Use: Never used  Substance and Sexual Activity   Alcohol use: No   Drug use: Yes    Frequency: 3.0 times per week    Types: Marijuana    Sexual activity: Never  Other Topics Concern   Not on file  Social History Narrative   Not on file   Social Determinants of Health   Financial Resource Strain: Not on file  Food Insecurity: Not on file  Transportation Needs: Not on file  Physical Activity: Not on file  Stress: Not on file  Social Connections: Not on file     Family History: History of coronary artery disease notable for ***. History of heart failure notable for ***. History of arrhythmia notable for ***. Denies family history of sudden cardiac death including drowning, car accidents, or unexplained deaths in the family.*** No history of bicuspid aortic valve or aortic aneurysm or dissection.  *** No history of cardiomyopathies including hypertrophic cardiomyopathy, left ventricular non-compaction, or arrhythmogenic right ventricular cardiomyopathy.*** No history of skeletal myopathies.***   ROS:   Please see the history of present illness.    *** All other systems reviewed and are negative.  EKGs/Labs/Other Studies Reviewed:    The following studies were reviewed today: ***  EKG:  EKG is *** ordered today.  The ekg ordered today demonstrates *** 01/07/22: ***  LEFT HEART CATH AND CORONARY ANGIOGRAPHY 10/22/2021  Narrative Conclusions: No angiographically significant coronary artery disease. Normal left ventricular filling pressure (LVEDP ~15 mmHg). Question  left ventricular outflow versus intracavitary gradient (~45 mmHg).  Recommendations: No coronary artery disease to explain elevated troponin.  Agree with plan for cardiac MRI tomorrow to assess for myocarditis and hypertrophic cardiomyopathy. Primary prevention of coronary artery disease.  Yvonne Kendall, MD [facility]     ECHO COMPLETE WITH IMAGING ENHANCING AGENT 10/20/2021  Narrative ECHOCARDIOGRAM REPORT    Patient Name:   Aaron [name] Date of Exam: 10/20/2021 Medical Rec #:  [id number]    Height:       75.0 in Accession #:     [id number]   Weight:       204.1 lb Date of Birth:  Mar 20, 1986    BSA:          2.213 m Patient Age:    35 years     BP:           151/83 mmHg Patient Gender: M            HR:           95 bpm. Exam Location:  Inpatient  Procedure: 2D Echo, Cardiac Doppler, Color Doppler and Intracardiac Opacification Agent  Indications:    NSTEMI  History:        Patient has no prior history of Echocardiogram examinations. Signs/Symptoms:Chest Pain.  Sonographer:    Roosvelt Maser RDCS Referring Phys: [id number] [place]  IMPRESSIONS   1. 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 2. Left ventricular ejection fraction, by estimation, is 55 to 60%. The left ventricle has normal function. The left ventricle has no regional wall motion abnormalities. The left ventricular internal cavity size was mildly dilated. There is mild left ventricular hypertrophy. Left ventricular diastolic parameters were normal. 3. Right ventricular systolic function is normal. The right ventricular size is mildly enlarged. 4. Left atrial size was moderately dilated. 5. Right atrial size was mildly dilated. 6. The mitral valve is normal in structure. Mild mitral valve regurgitation. No evidence of mitral stenosis. 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 7. The aortic valve is tricuspid. Aortic valve regurgitation is not visualized. No aortic stenosis is present. 8. The inferior vena cava is normal in size with greater than 50% respiratory variability, suggesting right atrial pressure of 3 mmHg.  FINDINGS Left Ventricle: Left ventricular ejection fraction, by estimation, is 55 to 60%. The left ventricle has normal function. The left ventricle has no regional wall motion abnormalities. Definity contrast agent was given IV to delineate the left ventricular endocardial borders. The left ventricular internal cavity size was mildly dilated. There is mild  left ventricular hypertrophy. Left ventricular diastolic parameters were normal.  Right Ventricle: The right ventricular size is mildly enlarged. Right ventricular systolic function is normal.  Left Atrium: Left atrial size was moderately dilated.  Right Atrium: Right atrial size was mildly dilated.  Pericardium: There is no evidence of pericardial effusion.  Mitral Valve: The mitral valve is normal in structure. 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 Mild mitral valve regurgitation. No evidence of mitral valve stenosis.  Tricuspid Valve: The tricuspid valve is normal in structure. Tricuspid valve regurgitation is mild . No evidence of tricuspid stenosis.  Aortic Valve: The aortic valve is tricuspid. Aortic valve regurgitation is not visualized. No aortic stenosis is present. Aortic valve mean gradient measures 3.0 mmHg. Aortic valve peak gradient measures 6.2 mmHg. Aortic valve area, by VTI measures 2.79 cm.  Pulmonic Valve: The pulmonic  valve was normal in structure. Pulmonic valve regurgitation is trivial. No evidence of pulmonic stenosis.  Aorta: The aortic root is normal in size and structure.  Venous: The inferior vena cava is normal in size with greater than 50% respiratory variability, suggesting right atrial pressure of 3 mmHg.  IAS/Shunts: No atrial level shunt detected by color flow Doppler.  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   LEFT VENTRICLE PLAX 2D LVIDd:         5.60 cm   Diastology LVIDs:         4.30 cm   LV e' medial:    11.20 cm/s LV PW:         1.20 cm   LV E/e' medial:  7.7 LV IVS:        1.10 cm   LV e' lateral:   12.00 cm/s LVOT diam:     2.10 cm   LV E/e' lateral: 7.2 LV SV:         57 LV SV Index:   26 LVOT Area:     3.46 cm   RIGHT VENTRICLE RV Basal diam:  4.70 cm RV Mid diam:    3.80 cm RV S prime:     13.60 cm/s TAPSE (M-mode): 3.2  cm  LEFT ATRIUM           Index        RIGHT ATRIUM           Index LA diam:      4.50 cm 2.03 cm/m   RA Area:     23.60 cm LA Vol (A4C): 99.4 ml 44.91 ml/m  RA Volume:   77.50 ml  35.01 ml/m AORTIC VALVE AV Area (Vmax):    3.10 cm AV Area (Vmean):   2.76 cm AV Area (VTI):     2.79 cm AV Vmax:           125.00 cm/s AV Vmean:          86.200 cm/s AV VTI:            0.206 m AV Peak Grad:      6.2 mmHg AV Mean Grad:      3.0 mmHg LVOT Vmax:         112.00 cm/s LVOT Vmean:        68.700 cm/s LVOT VTI:          0.166 m LVOT/AV VTI ratio: 0.81  AORTA Ao Root diam: 3.50 cm Ao Asc diam:  3.00 cm  MITRAL VALVE MV Area (PHT): 6.96 cm       SHUNTS MV Decel Time: 109 msec       Systemic VTI:  0.17 m MR Peak grad:    102.8 mmHg   Systemic Diam: 2.10 cm MR Vmax:         507.00 cm/s MR PISA:         16.08 cm MR PISA Eff ROA: 96 mm MR PISA Radius:  1.60 cm MV E velocity: 86.50 cm/s MV A velocity: 51.90 cm/s MV E/A ratio:  1.67   MR CARDIAC MORPHOLOGY W WO CONTRAST 10/23/2021  Narrative CLINICAL DATA:  Clinical question of NSTEMI Study assumes BSA of 2.15 m2.  EXAM: CARDIAC MRI  TECHNIQUE: The patient was scanned on a 1.5 Tesla GE magnet. A dedicated cardiac coil was used. Functional imaging was done using Fiesta sequences. 2,3, and 4 chamber views were done to assess for RWMA's. Modified  Simpson's rule using a short axis stack was used to calculate an ejection fraction on a dedicated work Research officer, trade unionstation using Circle software. The patient received 10 cc of Gadavist. After 10 minutes inversion recovery sequences were used to assess for infiltration and scar tissue.  CONTRAST:  10 cc  of Gadavist  FINDINGS: 1. Mildly increased left ventricular size, with LVEDD 57 mm, and LVEDVi 107 mL/m2.  Mild asymmetric septal hypertrophy, with intraventricular septal thickness of 12 mm, posterior wall thickness of 7 mm, and myocardial mass index of 82 g/m2.  Mildly decreased left  ventricular systolic function (LVEF =49%). There is apical inferior hypokinesis.  Left ventricular parametric mapping notable for normal T2.  There is late gadolinium enhancement in the left ventricular myocardium: Transmural apical septal LGE.  2.  Mildly increased right ventricular size with RVEDVI 99 mL/m2.  Normal right ventricular thickness.  Mildly reduced right ventricular systolic function (RVEF =46%). There are no regional wall motion abnormalities or aneurysms.  3.  Normal left and right atrial size.  4. Normal size of the aortic root, ascending aorta and pulmonary artery.  5. Valve assessment:  Aortic Valve: Tri-leaflet aortic valve. Regurgitation fraction 3%, Mean gradient 1 mmHg. There is no significant regurgitation or stenosis.  Pulmonic Valve: Regurgitation fraction 8%, Mean gradient < 1 mmHg. There is no significant regurgitation or stenosis.  Tricuspid Valve: Regurgitation fraction 27%. Mild to moderate tricuspid regurgitation.  Mitral Valve: Regurgitation fraction 16%. Mild mitral regurgitation.  6.  Normal pericardium.  No pericardial effusion.  7. Grossly, no extracardiac findings. Recommended dedicated study if concerned for non-cardiac pathology.  IMPRESSION: LVEF 49%  Small apical transmural LGE consistent with scar.  Riley LamMahesh Maureena Dabbs MD   Electronically Signed By: Riley LamMahesh   Jonetta Dagley M.D. On: 10/23/2021 21:41   Recent Labs: 10/19/2021: TSH <0.010 10/20/2021: B Natriuretic Peptide 353.6; Magnesium 1.4 10/23/2021: BUN 12; Creatinine, Ser 0.63; Hemoglobin 16.1; Platelets 245; Potassium 4.0; Sodium 137  Recent Lipid Panel    Component Value Date/Time   CHOL 103 10/20/2021 0107   TRIG 25 10/20/2021 0107   HDL 33 (L) 10/20/2021 0107   CHOLHDL 3.1 10/20/2021 0107   VLDL 5 10/20/2021 0107   LDLCALC 65 10/20/2021 0107     Risk Assessment/Calculations:   {Does this patient have ATRIAL FIBRILLATION?:[phone]}  No BP recorded.   {Refresh Note OR Click here to enter BP  :1}***         Physical Exam:    VS:  There were no vitals taken for this visit.    Wt Readings from Last 3 Encounters:  10/23/21 191 lb 14.4 oz (87 kg)  08/29/16 215 lb (97.5 kg)    Gen: *** distress, *** obese/well nourished/malnourished   Neck: No JVD, *** carotid bruit Ears: *** Frank Sign Cardiac: No Rubs or Gallops, *** Murmur, ***cardia, *** radial pulses Respiratory: Clear to auscultation bilaterally, *** effort, ***  respiratory rate GI: Soft, nontender, non-distended *** MS: No *** edema; *** moves all extremities Integument: Skin feels *** Neuro:  At time of evaluation, alert and oriented to person/place/time/situation *** Psych: Normal affect, patient feels ***   ASSESSMENT:    No diagnosis found. PLAN:    Embolic CAD HTN  Hyperthyroidism      {Are you ordering a CV Procedure (e.g. stress test, cath, DCCV, TEE, etc)?   Press F2        :[id number]}    Medication Adjustments/Labs and Tests Ordered: Current medicines are reviewed at length with the patient today.  Concerns  regarding medicines are outlined above.  No orders of the defined types were placed in this encounter.  No orders of the defined types were placed in this encounter.   There are no Patient Instructions on file for this visit.   Signed, Christell Constant, MD  01/05/2022 4:43 PM    [facility]

## 2022-01-07 ENCOUNTER — Ambulatory Visit: Payer: Self-pay | Admitting: Internal Medicine

## 2022-01-28 ENCOUNTER — Ambulatory Visit: Payer: Self-pay | Admitting: Internal Medicine

## 2022-02-05 ENCOUNTER — Emergency Department (HOSPITAL_COMMUNITY)
Admission: EM | Admit: 2022-02-05 | Discharge: 2022-02-05 | Discharge status: Against Medical Advice | Payer: Self-pay | Attending: Student | Admitting: Student

## 2022-02-05 ENCOUNTER — Encounter (HOSPITAL_COMMUNITY): Payer: Self-pay

## 2022-02-05 DIAGNOSIS — R6883 Chills (without fever): Secondary | ICD-10-CM | POA: Insufficient documentation

## 2022-02-05 DIAGNOSIS — R112 Nausea with vomiting, unspecified: Secondary | ICD-10-CM | POA: Insufficient documentation

## 2022-02-05 DIAGNOSIS — Z20822 Contact with and (suspected) exposure to covid-19: Secondary | ICD-10-CM | POA: Insufficient documentation

## 2022-02-05 DIAGNOSIS — R197 Diarrhea, unspecified: Secondary | ICD-10-CM | POA: Insufficient documentation

## 2022-02-05 DIAGNOSIS — Z5321 Procedure and treatment not carried out due to patient leaving prior to being seen by health care provider: Secondary | ICD-10-CM | POA: Insufficient documentation

## 2022-02-05 DIAGNOSIS — R1084 Generalized abdominal pain: Secondary | ICD-10-CM | POA: Insufficient documentation

## 2022-02-05 LAB — COMPREHENSIVE METABOLIC PANEL
ALT: 36 U/L (ref 0–44)
AST: 44 U/L — ABNORMAL HIGH (ref 15–41)
Albumin: 4.6 g/dL (ref 3.5–5.0)
Alkaline Phosphatase: 120 U/L (ref 38–126)
Anion gap: 9 (ref 5–15)
BUN: 8 mg/dL (ref 6–20)
CO2: 21 mmol/L — ABNORMAL LOW (ref 22–32)
Calcium: 10 mg/dL (ref 8.9–10.3)
Chloride: 105 mmol/L (ref 98–111)
Creatinine, Ser: 0.45 mg/dL — ABNORMAL LOW (ref 0.61–1.24)
GFR, Estimated: >60 mL/min (ref >60)
Glucose, Bld: 111 mg/dL — ABNORMAL HIGH (ref 70–99)
Potassium: 3.9 mmol/L (ref 3.5–5.1)
Sodium: 135 mmol/L (ref 135–145)
Total Bilirubin: 1.9 mg/dL — ABNORMAL HIGH (ref 0.3–1.2)
Total Protein: 8.5 g/dL — ABNORMAL HIGH (ref 6.5–8.1)

## 2022-02-05 LAB — CBC WITH DIFFERENTIAL/PLATELET
Abs Immature Granulocytes: 0.02 10*3/uL (ref 0.00–0.07)
Basophils Absolute: 0 10*3/uL (ref 0.0–0.1)
Basophils Relative: 0 %
Eosinophils Absolute: 0 10*3/uL (ref 0.0–0.5)
Eosinophils Relative: 0 %
HCT: 45 % (ref 39.0–52.0)
Hemoglobin: 15.6 g/dL (ref 13.0–17.0)
Immature Granulocytes: 0 %
Lymphocytes Relative: 10 %
Lymphs Abs: 0.9 10*3/uL (ref 0.7–4.0)
MCH: 27.1 pg (ref 26.0–34.0)
MCHC: 34.7 g/dL (ref 30.0–36.0)
MCV: 78.1 fL — ABNORMAL LOW (ref 80.0–100.0)
Monocytes Absolute: 0.6 10*3/uL (ref 0.1–1.0)
Monocytes Relative: 8 %
Neutro Abs: 6.6 10*3/uL (ref 1.7–7.7)
Neutrophils Relative %: 82 %
Platelets: 275 10*3/uL (ref 150–400)
RBC: 5.76 MIL/uL (ref 4.22–5.81)
RDW: 13.3 % (ref 11.5–15.5)
WBC: 8.2 10*3/uL (ref 4.0–10.5)
nRBC: 0 % (ref 0.0–0.2)

## 2022-02-05 LAB — URINALYSIS, ROUTINE W REFLEX MICROSCOPIC
Bacteria, UA: NONE SEEN
Bilirubin Urine: NEGATIVE
Glucose, UA: NEGATIVE mg/dL
Hgb urine dipstick: NEGATIVE
Ketones, ur: 80 mg/dL — AB
Leukocytes,Ua: NEGATIVE
Nitrite: NEGATIVE
Protein, ur: 100 mg/dL — AB
Specific Gravity, Urine: 1.023 (ref 1.005–1.030)
pH: 6 (ref 5.0–8.0)

## 2022-02-05 LAB — RESP PANEL BY RT-PCR (FLU A&B, COVID) ARPGX2
Influenza A by PCR: NEGATIVE
Influenza B by PCR: NEGATIVE
SARS Coronavirus 2 by RT PCR: NEGATIVE

## 2022-02-05 LAB — LIPASE, BLOOD: Lipase: 21 U/L (ref 11–51)

## 2022-02-05 NOTE — ED Provider Triage Note (Signed)
Emergency Medicine Provider Triage Evaluation Note  Aaron [name] , a 37 y.o. male  was evaluated in triage.  Pt complains of nausea, vomiting, diarrhea that started around 9 PM last night.  No sore throat or cough.  Admits to generalized abdominal pain.  Review of Systems  Positive: N/V/D Negative: CP  Physical Exam  BP (!) 171/102 (BP Location: Left Arm)   Pulse 98   Temp 98.9 F (37.2 C) (Oral)   Resp 18   SpO2 100%  Gen:   Awake, no distress   Resp:  Normal effort  MSK:   Moves extremities without difficulty  Other:    Medical Decision Making  Medically screening exam initiated at 8:57 AM.  Appropriate orders placed.  Aaron [name] was informed that the remainder of the evaluation will be completed by another provider, this initial triage assessment does not replace that evaluation, and the importance of remaining in the ED until their evaluation is complete.  Labs COVID   Suzy Bouchard, [place] 02/05/22 [phone]

## 2022-02-05 NOTE — ED Triage Notes (Signed)
Pt arrived via POV, c/o n/v, diarrhea, chills since 9pm last night.

## 2022-03-30 ENCOUNTER — Observation Stay (HOSPITAL_COMMUNITY)
Admission: EM | Admit: 2022-03-30 | Discharge: 2022-04-01 | Disposition: A | Discharge status: Home / Self Care | Payer: Self-pay | Attending: Internal Medicine | Admitting: Internal Medicine

## 2022-03-30 ENCOUNTER — Emergency Department (HOSPITAL_COMMUNITY): Payer: Self-pay

## 2022-03-30 ENCOUNTER — Other Ambulatory Visit: Payer: Self-pay

## 2022-03-30 ENCOUNTER — Encounter (HOSPITAL_COMMUNITY): Payer: Self-pay

## 2022-03-30 DIAGNOSIS — Z7982 Long term (current) use of aspirin: Secondary | ICD-10-CM | POA: Insufficient documentation

## 2022-03-30 DIAGNOSIS — Z79899 Other long term (current) drug therapy: Secondary | ICD-10-CM | POA: Insufficient documentation

## 2022-03-30 DIAGNOSIS — E0590 Thyrotoxicosis, unspecified without thyrotoxic crisis or storm: Secondary | ICD-10-CM | POA: Diagnosis present

## 2022-03-30 DIAGNOSIS — I10 Essential (primary) hypertension: Secondary | ICD-10-CM | POA: Insufficient documentation

## 2022-03-30 DIAGNOSIS — E039 Hypothyroidism, unspecified: Secondary | ICD-10-CM | POA: Insufficient documentation

## 2022-03-30 DIAGNOSIS — R072 Precordial pain: Principal | ICD-10-CM | POA: Insufficient documentation

## 2022-03-30 DIAGNOSIS — R079 Chest pain, unspecified: Secondary | ICD-10-CM | POA: Diagnosis present

## 2022-03-30 LAB — CBC WITH DIFFERENTIAL/PLATELET
Abs Immature Granulocytes: 0.01 10*3/uL (ref 0.00–0.07)
Basophils Absolute: 0 10*3/uL (ref 0.0–0.1)
Basophils Relative: 1 %
Eosinophils Absolute: 0.1 10*3/uL (ref 0.0–0.5)
Eosinophils Relative: 1 %
HCT: 40.6 % (ref 39.0–52.0)
Hemoglobin: 13.6 g/dL (ref 13.0–17.0)
Immature Granulocytes: 0 %
Lymphocytes Relative: 35 %
Lymphs Abs: 2.3 10*3/uL (ref 0.7–4.0)
MCH: 26.7 pg (ref 26.0–34.0)
MCHC: 33.5 g/dL (ref 30.0–36.0)
MCV: 79.6 fL — ABNORMAL LOW (ref 80.0–100.0)
Monocytes Absolute: 1 10*3/uL (ref 0.1–1.0)
Monocytes Relative: 15 %
Neutro Abs: 3.2 10*3/uL (ref 1.7–7.7)
Neutrophils Relative %: 48 %
Platelets: 254 10*3/uL (ref 150–400)
RBC: 5.1 MIL/uL (ref 4.22–5.81)
RDW: 14.2 % (ref 11.5–15.5)
WBC: 6.6 10*3/uL (ref 4.0–10.5)
nRBC: 0 % (ref 0.0–0.2)

## 2022-03-30 LAB — RAPID URINE DRUG SCREEN, HOSP PERFORMED
Amphetamines: NOT DETECTED
Barbiturates: NOT DETECTED
Benzodiazepines: NOT DETECTED
Cocaine: NOT DETECTED
Opiates: NOT DETECTED
Tetrahydrocannabinol: POSITIVE — AB

## 2022-03-30 LAB — COMPREHENSIVE METABOLIC PANEL
ALT: 29 U/L (ref 0–44)
AST: 24 U/L (ref 15–41)
Albumin: 4.2 g/dL (ref 3.5–5.0)
Alkaline Phosphatase: 112 U/L (ref 38–126)
Anion gap: 6 (ref 5–15)
BUN: 14 mg/dL (ref 6–20)
CO2: 25 mmol/L (ref 22–32)
Calcium: 9 mg/dL (ref 8.9–10.3)
Chloride: 109 mmol/L (ref 98–111)
Creatinine, Ser: 0.58 mg/dL — ABNORMAL LOW (ref 0.61–1.24)
GFR, Estimated: >60 mL/min (ref >60)
Glucose, Bld: 99 mg/dL (ref 70–99)
Potassium: 3.9 mmol/L (ref 3.5–5.1)
Sodium: 140 mmol/L (ref 135–145)
Total Bilirubin: 0.9 mg/dL (ref 0.3–1.2)
Total Protein: 7.5 g/dL (ref 6.5–8.1)

## 2022-03-30 LAB — TROPONIN I (HIGH SENSITIVITY)
Troponin I (High Sensitivity): 15 ng/L (ref <18)
Troponin I (High Sensitivity): 15 ng/L (ref <18)
Troponin I (High Sensitivity): 20 ng/L — ABNORMAL HIGH (ref <18)

## 2022-03-30 LAB — LIPASE, BLOOD: Lipase: 27 U/L (ref 11–51)

## 2022-03-30 LAB — MAGNESIUM: Magnesium: 1.6 mg/dL — ABNORMAL LOW (ref 1.7–2.4)

## 2022-03-30 LAB — TSH: TSH: <0.01 u[IU]/mL — ABNORMAL LOW (ref 0.350–4.500)

## 2022-03-30 LAB — ETHANOL: Alcohol, Ethyl (B): <10 mg/dL (ref <10)

## 2022-03-30 MED ORDER — MORPHINE SULFATE (PF) 4 MG/ML IV SOLN
4.0000 mg | Freq: Once | INTRAVENOUS | Status: AC
  Administered 2022-03-30: 4 mg via INTRAVENOUS
  Filled 2022-03-30: qty 1

## 2022-03-30 MED ORDER — ASPIRIN 81 MG PO TBEC
81.0000 mg | DELAYED_RELEASE_TABLET | Freq: Every day | ORAL | Status: DC
  Administered 2022-03-30 – 2022-04-01 (×3): 81 mg via ORAL
  Filled 2022-03-30 (×3): qty 1

## 2022-03-30 MED ORDER — METOPROLOL SUCCINATE ER 25 MG PO TB24
25.0000 mg | ORAL_TABLET | Freq: Every day | ORAL | Status: DC
  Administered 2022-03-30: 25 mg via ORAL
  Filled 2022-03-30: qty 1

## 2022-03-30 MED ORDER — NITROGLYCERIN 0.4 MG SL SUBL
0.4000 mg | SUBLINGUAL_TABLET | SUBLINGUAL | Status: DC | PRN
  Administered 2022-03-30 – 2022-03-31 (×4): 0.4 mg via SUBLINGUAL
  Filled 2022-03-30 (×3): qty 1

## 2022-03-30 MED ORDER — METHIMAZOLE 10 MG PO TABS
10.0000 mg | ORAL_TABLET | Freq: Once | ORAL | Status: AC
  Administered 2022-03-30: 10 mg via ORAL
  Filled 2022-03-30: qty 1

## 2022-03-30 MED ORDER — ACETAMINOPHEN 650 MG RE SUPP: 650.0000 mg | Freq: Four times a day (QID) | RECTAL | Status: DC | PRN

## 2022-03-30 MED ORDER — IOHEXOL 350 MG/ML SOLN
75.0000 mL | Freq: Once | INTRAVENOUS | Status: AC | PRN
  Administered 2022-03-30: 75 mL via INTRAVENOUS

## 2022-03-30 MED ORDER — LABETALOL HCL 5 MG/ML IV SOLN
10.0000 mg | Freq: Once | INTRAVENOUS | Status: AC
  Administered 2022-03-30: 10 mg via INTRAVENOUS
  Filled 2022-03-30: qty 4

## 2022-03-30 MED ORDER — METHIMAZOLE 10 MG PO TABS
10.0000 mg | ORAL_TABLET | Freq: Three times a day (TID) | ORAL | Status: DC
  Administered 2022-03-30 – 2022-04-01 (×7): 10 mg via ORAL
  Filled 2022-03-30 (×8): qty 1

## 2022-03-30 MED ORDER — MAGNESIUM OXIDE -MG SUPPLEMENT 400 (240 MG) MG PO TABS
400.0000 mg | ORAL_TABLET | Freq: Every day | ORAL | Status: AC
  Administered 2022-03-30 – 2022-04-01 (×3): 400 mg via ORAL
  Filled 2022-03-30 (×3): qty 1

## 2022-03-30 MED ORDER — ACETAMINOPHEN 325 MG PO TABS: 650.0000 mg | ORAL_TABLET | Freq: Four times a day (QID) | ORAL | Status: DC | PRN

## 2022-03-30 MED ORDER — METOPROLOL TARTRATE 50 MG PO TABS
50.0000 mg | ORAL_TABLET | Freq: Two times a day (BID) | ORAL | Status: DC
  Administered 2022-03-30 – 2022-04-01 (×5): 50 mg via ORAL
  Filled 2022-03-30 (×5): qty 1

## 2022-03-30 MED ORDER — KETOROLAC TROMETHAMINE 30 MG/ML IJ SOLN
30.0000 mg | Freq: Once | INTRAMUSCULAR | Status: AC
  Administered 2022-03-30: 30 mg via INTRAVENOUS
  Filled 2022-03-30: qty 1

## 2022-03-30 MED ORDER — ASPIRIN 81 MG PO CHEW
324.0000 mg | CHEWABLE_TABLET | Freq: Once | ORAL | Status: AC
  Administered 2022-03-30: 324 mg via ORAL
  Filled 2022-03-30: qty 4

## 2022-03-30 MED ORDER — METOPROLOL TARTRATE 25 MG PO TABS
50.0000 mg | ORAL_TABLET | Freq: Once | ORAL | Status: AC
  Administered 2022-03-30: 50 mg via ORAL
  Filled 2022-03-30: qty 2

## 2022-03-30 NOTE — ED Notes (Signed)
Urine in main lab 

## 2022-03-30 NOTE — H&P (Signed)
History and Physical    Patient: Aaron [name] DOB: [birth year] DOA: 03/30/2022 DOS: the patient was seen and examined on 03/30/2022 PCP: Patient, No Pcp Per  Patient coming from: Home  Chief Complaint:  Chief Complaint  Patient presents with   Chest Pain   HPI: Aaron [name] is a 36 y.o. male with medical history significant of hypothyroidism, NSTEMI earlier this year with normal coronaries, hypertension who presented to the emergency department with complaints of pressure-like chest pain associated with palpitations and nervousness since yesterday evening.  The patient stated he was having trouble going to sleep.  He also reported some recent heat intolerance.  He has not been taking his methimazole or metoprolol for several weeks.   No PND, orthopnea or pitting edema of the lower extremities. He denied fever, chills, rhinorrhea, sore throat, wheezing or hemoptysis.  No abdominal pain, nausea, emesis, diarrhea, constipation, melena or hematochezia.  No flank pain, dysuria, frequency or hematuria.  No polyuria, polydipsia, polyphagia or blurred vision.  He does not have a PCP or endocrinologist.  ED course: Initial vital signs were temperature 98.6 F, pulse 118, respiration 22, BP 182/114 mmHg O2 sat 100% on room air.  The patient received 324 mg of aspirin, ketorolac 30 mg IVP, labetalol 10 mg IVP, morphine 4 mg IVP, methimazole 10 mg p.o. and metoprolol tartrate 50 mg p.o.  Lab work: CBC is her white count 6.6, Mono 13.6 g/dL platelets 259.  UDS was positive for THC.  Troponin x2 were 15 ng/L.  Normal alcohol and lipase level.  CMP with a low creatinine of 0.58 mg/deciliter, but are otherwise unremarkable.  His TCH was less than 0.01 IU/mL.  Imaging: Chest radiograph with no active disease.  CTA chest with no acute finding, including PE.   Review of Systems: As mentioned in the history of present illness. All other systems reviewed and are negative. Past Medical History:   Diagnosis Date   Medical history non-contributory    Past Surgical History:  Procedure Laterality Date   LEFT HEART CATH AND CORONARY ANGIOGRAPHY N/A 10/22/2021   Procedure: LEFT HEART CATH AND CORONARY ANGIOGRAPHY;  Surgeon: Yvonne Kendall, MD;  Location: [facility];  Service: Cardiovascular;  Laterality: N/A;   NO PAST SURGERIES     Social History:  reports that he has never smoked. He has never used smokeless tobacco. He reports current drug use. Frequency: 3.00 times per week. Drug: Marijuana. He reports that he does not drink alcohol.  No Known Allergies  History reviewed. No pertinent family history.  Prior to Admission medications   Medication Sig Start Date End Date Taking? Authorizing Provider  aspirin EC 81 MG tablet Take 1 tablet (81 mg total) by mouth daily. Swallow whole. 10/24/21   Bhagat, Sharrell Ku, PA  methimazole (TAPAZOLE) 10 MG tablet Take 1 tablet (10 mg total) by mouth every 8 (eight) hours. 10/23/21   Bhagat, Sharrell Ku, PA  metoprolol succinate (TOPROL XL) 25 MG 24 hr tablet Take 1 tablet (25 mg total) by mouth daily. 10/23/21 10/23/22  Bhagat, Sharrell Ku, PA  nitroGLYCERIN (NITROSTAT) 0.4 MG SL tablet Place 1 tablet (0.4 mg total) under the tongue every 5 (five) minutes x 3 doses as needed for chest pain. 10/23/21   Manson Passey, PA    Physical Exam: Vitals:   03/30/22 0529 03/30/22 0535 03/30/22 0600 03/30/22 0640  BP: (!) 136/120 (!) 140/85 134/77 (!) 140/87  Pulse: (!) 118 (!) 113 (!) 116 (!) 102  Resp:  [name])  26 20 19   Temp:      TempSrc:      SpO2:  100% 100% 99%  Weight:      Height:       Physical Exam Vitals and nursing note reviewed.  Constitutional:      General: He is awake. He is not in acute distress.    Appearance: He is well-developed and normal weight.  HENT:     Head: Normocephalic.     Nose: No rhinorrhea.     Mouth/Throat:     Mouth: Mucous membranes are moist.  Eyes:     General: No scleral icterus.    Pupils:  Pupils are equal, round, and reactive to light.  Neck:     Vascular: No JVD.  Cardiovascular:     Rate and Rhythm: Regular rhythm. Tachycardia present.     Heart sounds: S1 normal and S2 normal.  Pulmonary:     Effort: Pulmonary effort is normal.     Breath sounds: No wheezing, rhonchi or rales.  Abdominal:     General: Bowel sounds are normal. There is no distension.     Palpations: Abdomen is soft.     Tenderness: There is no abdominal tenderness. There is no guarding.  Musculoskeletal:     Cervical back: Neck supple.     Right lower leg: No edema.     Left lower leg: No edema.  Skin:    General: Skin is warm and dry.  Neurological:     General: No focal deficit present.     Mental Status: He is alert and oriented to person, place, and time.  Psychiatric:        Mood and Affect: Mood normal.        Behavior: Behavior is cooperative.     Data Reviewed:  There are no new results to review at this time.  LEFT HEART CATH AND CORONARY ANGIOGRAPHY   Conclusion  Conclusions: No angiographically significant coronary artery disease. Normal left ventricular filling pressure (LVEDP ~15 mmHg). Question left ventricular outflow versus intracavitary gradient (~45 mmHg).   Recommendations: No coronary artery disease to explain elevated troponin.  Agree with plan for cardiac MRI tomorrow to assess for myocarditis and hypertrophic cardiomyopathy. Primary prevention of coronary artery disease.   , MD [facility]  Echocardiogram 10/20/2021. IMPRESSIONS:   1. 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   2. Left ventricular ejection fraction, by estimation, is 55 to 60%. The  left ventricle has normal function. The left ventricle has no regional  wall motion abnormalities. The left ventricular internal cavity size was  mildly dilated. There is mild left  ventricular hypertrophy. Left ventricular diastolic parameters were   normal.   3. Right ventricular systolic function is normal. The right ventricular  size is mildly enlarged.   4. Left atrial size was moderately dilated.   5. Right atrial size was mildly dilated.   6. The mitral valve is normal in structure. Mild mitral valve  regurgitation. No evidence of mitral stenosis. There is moderate  holosystolic prolapse of both leaflets of the mitral valve.   7. The aortic valve is tricuspid. Aortic valve regurgitation is not  visualized. No aortic stenosis is present.   8. The inferior vena cava is normal in size with greater than 50%  respiratory variability, suggesting right atrial pressure of 3 mmHg.   Assessment and Plan: Principal Problem:   Chest pain Observation/telemetry. Supplemental oxygen as needed. Continue  daily aspirin. Resume metoprolol at 50 mg twice daily. Hyperthyroidism control. Needs to establish with PCP.  Active Problems:   Hyperthyroidism Resume methimazole 10 mg p.o. 3 times daily. Needs to establish OP care with endocrinology.    Essential hypertension Resume metoprolol, but increase to 50 mg p.o. twice daily. Adjust therapy as needed. Beta-blocker need may decrease as hyperthyroidism gets control.    Advance Care Planning:   Code Status: Full Code   Consults:   Family Communication:   Severity of Illness: The appropriate patient status for this patient is OBSERVATION. Observation status is judged to be reasonable and necessary in order to provide the required intensity of service to ensure the patient's safety. The patient's presenting symptoms, physical exam findings, and initial radiographic and laboratory data in the context of their medical condition is felt to place them at decreased risk for further clinical deterioration. Furthermore, it is anticipated that the patient will be medically stable for discharge from the hospital within 2 midnights of admission.   Author: Bobette Mo, MD 03/30/2022 8:14  AM  For on call review [email or web address].   This document was prepared using Dragon voice recognition software and may contain some unintended transcription errors.

## 2022-03-30 NOTE — Progress Notes (Signed)
  Carryover admission to the Day Admitter.  I discussed this case with the EDP, Dr. Jacqulyn Bath.  Per these discussions:   This is a 36 year old male who was recently hospitalized in June 2020 for NSTEMI, who is being admitted today for further evaluation/management of his presenting chest pain, which the patient conveys this very similar in distribution, quality, intensity to the pain he was experiencing at the time of his NSTEMI hospitalization in June 2023.   Mildly tachycardic.  Troponin x2 were both found to be 15.  EKG reportedly showed sinus tachycardia, but without evidence of heart acute ischemic changes, including no evidence of ST elevation.  Chest x-ray shows no evidence of acute cardiopulmonary process.  CTA chest shows no evidence of acute pulmonary embolism or any other acute cardiopulmonary process.   Is also noted that the patient has a history of hyperthyroidism, and independently discontinued his outpatient methimazole and Lopressor several weeks ago.  From my discussions with the EDP, patient is not altered from a mental status standpoint and there is no evidence to suggest acutely decompensated heart failure.  He does however appear mildly anxious, in the setting of chest pain/sinus tachycardia, EDP added on thyroid labs, and provided single doses of the patient's methimazole and Lopressor.  Per my discussions with the EDP, presentation is not consistent with overt thyroid storm.   I have placed an order for observation to med telemetry for further evaluation management of the patient's presenting chest discomfort, as well as further evaluation of his hypothyroidism given recent independent discontinuation outpatient medical management, as above.  I have placed some additional preliminary admit orders via the adult multi-morbid admission order set. I have also ordered add on serum magnesium level, and ordered a third troponin level as a once timed ordered to be checked at 10 AM.  Has  existing order for as needed sublingual nitroglycerin.    Newton Pigg, DO Hospitalist

## 2022-03-30 NOTE — ED Provider Notes (Signed)
Emergency Department Provider Note   I have reviewed the triage vital signs and the nursing notes.   HISTORY  Chief Complaint Chest Pain   HPI Aaron [name] is a 36 y.o. male presents to the emergency department for evaluation of chest pain starting 2 hours prior to ED presentation.  Describes a central tightness/pressure in the chest similar to pain felt in June with his NSTEMI.  He had a left heart cath at that time that showed no obstructing CAD.  Patient tells me he has been out of his medications for at least the past several weeks.  He denies any alcohol or drug use.  No pleuritic pain or shortness of breath.  No fevers or chills.  No cough.  Denies injury.   Past Medical History:  Diagnosis Date   Medical history non-contributory     Review of Systems  Constitutional: No fever/chills Eyes: No visual changes. ENT: No sore throat. Cardiovascular: Positive chest pain. Respiratory: Denies shortness of breath. Gastrointestinal: No abdominal pain.  No nausea, no vomiting.  No diarrhea.  No constipation. Genitourinary: Negative for dysuria. Musculoskeletal: Negative for back pain. Skin: Negative for rash. Neurological: Negative for headaches, focal weakness or numbness.   ____________________________________________   PHYSICAL EXAM:  VITAL SIGNS: ED Triage Vitals  Enc Vitals Group     BP 03/30/22 0051 (!) 182/114     Pulse Rate 03/30/22 0051 (!) 118     Resp 03/30/22 0051 (!) 22     Temp 03/30/22 0051 98.6 F (37 C)     Temp Source 03/30/22 0051 Oral     SpO2 03/30/22 0051 100 %     Weight 03/30/22 0052 200 lb (90.7 kg)     Height 03/30/22 0052 6\' 3"  (1.905 m)   Constitutional: Alert and oriented. Appears somewhat uncomfortable.  Eyes: Conjunctivae are normal.  Head: Atraumatic. Nose: No congestion/rhinnorhea. Mouth/Throat: Mucous membranes are moist.  Neck: No stridor.  Cardiovascular: Tachycardia. Good peripheral circulation. Grossly normal heart  sounds.   Respiratory: Normal respiratory effort.  No retractions. Lungs CTAB. Gastrointestinal: Soft and nontender. No distention.  Musculoskeletal: No lower extremity tenderness nor edema. No gross deformities of extremities. Neurologic:  Normal speech and language. No gross focal neurologic deficits are appreciated.  Skin:  Skin is warm, dry and intact. No rash noted.  ____________________________________________   LABS (all labs ordered are listed, but only abnormal results are displayed)  Labs Reviewed  COMPREHENSIVE METABOLIC PANEL - Abnormal; Notable for the following components:      Result Value   Creatinine, Ser 0.58 (*)    All other components within normal limits  CBC WITH DIFFERENTIAL/PLATELET - Abnormal; Notable for the following components:   MCV 79.6 (*)    All other components within normal limits  RAPID URINE DRUG SCREEN, HOSP PERFORMED - Abnormal; Notable for the following components:   Tetrahydrocannabinol POSITIVE (*)    All other components within normal limits  TSH - Abnormal; Notable for the following components:   TSH <0.010 (*)    All other components within normal limits  MAGNESIUM - Abnormal; Notable for the following components:   Magnesium 1.6 (*)    All other components within normal limits  TROPONIN I (HIGH SENSITIVITY) - Abnormal; Notable for the following components:   Troponin I (High Sensitivity) 20 (*)    All other components within normal limits  LIPASE, BLOOD  ETHANOL  TROPONIN I (HIGH SENSITIVITY)  TROPONIN I (HIGH SENSITIVITY)   ____________________________________________  EKG  EKG Interpretation  Date/Time:  Saturday March 30 2022 04:29:52 EST Ventricular Rate:  119 PR Interval:  164 QRS Duration: 98 QT Interval:  333 QTC Calculation: 469 R Axis:   98 Text Interpretation: Sinus tachycardia Probable left atrial enlargement Borderline right axis deviation Unchanged from arrival EKG Confirmed by Alona Bene [phone]) on  03/30/2022 4:38:01 AM        ____________________________________________  RADIOLOGY  CT Angio Chest PE W and/or Wo Contrast  Result Date: 03/30/2022 CLINICAL DATA:  Chest pain beginning a couple of days ago, symptoms similar to heart attack in June EXAM: CT ANGIOGRAPHY CHEST WITH CONTRAST TECHNIQUE: Multidetector CT imaging of the chest was performed using the standard protocol during bolus administration of intravenous contrast. Multiplanar CT image reconstructions and MIPs were obtained to evaluate the vascular anatomy. RADIATION DOSE REDUCTION: This exam was performed according to the departmental dose-optimization program which includes automated exposure control, adjustment of the mA and/or kV according to patient size and/or use of iterative reconstruction technique. CONTRAST:  45mL OMNIPAQUE IOHEXOL 350 MG/ML SOLN COMPARISON:  10/10/2008 chest CT report FINDINGS: Cardiovascular: Satisfactory opacification of the pulmonary arteries to the segmental level. No evidence of pulmonary embolism. Enlarged appearance of the heart on axial slices although less convincing on the scanogram and prior chest x-ray. No pericardial effusion. Mediastinum/Nodes: Negative for adenopathy or mass. Minimal residual thymus without nodularity. Known goiter Lungs/Pleura: There is no edema, consolidation, effusion, or pneumothorax. Upper Abdomen: Negative Musculoskeletal: No acute or focal finding. Review of the MIP images confirms the above findings. IMPRESSION: No acute finding, including pulmonary embolism. Electronically Signed   By: Tiburcio Pea M.D.   On: 03/30/2022 04:15   DG Chest Port 1 View  Result Date: 03/30/2022 CLINICAL DATA:  Chest pain EXAM: PORTABLE CHEST 1 VIEW COMPARISON:  10/19/2021 FINDINGS: The heart size and mediastinal contours are within normal limits. Both lungs are clear. The visualized skeletal structures are unremarkable. IMPRESSION: No active disease. Electronically Signed   By: Charlett Nose M.D.   On: 03/30/2022 01:27    ____________________________________________   PROCEDURES  Procedure(s) performed:   Procedures  None  ____________________________________________   INITIAL IMPRESSION / ASSESSMENT AND PLAN / ED COURSE  Pertinent labs & imaging results that were available during my care of the patient were reviewed by me and considered in my medical decision making (see chart for details).   This patient is Presenting for Evaluation of CP, which does require a range of treatment options, and is a complaint that involves a high risk of morbidity and mortality.  The Differential Diagnoses includes but is not exclusive to acute coronary syndrome, aortic dissection, pulmonary embolism, cardiac tamponade, community-acquired pneumonia, pericarditis, musculoskeletal chest wall pain, etc.   Critical Interventions-    Medications  nitroGLYCERIN (NITROSTAT) SL tablet 0.4 mg (0.4 mg Sublingual Given 03/30/22 0222)  acetaminophen (TYLENOL) tablet 650 mg (has no administration in time range)    Or  acetaminophen (TYLENOL) suppository 650 mg (has no administration in time range)  methimazole (TAPAZOLE) tablet 10 mg (10 mg Oral Given 03/30/22 2145)  aspirin EC tablet 81 mg (81 mg Oral Given 03/30/22 0914)  metoprolol tartrate (LOPRESSOR) tablet 50 mg (50 mg Oral Given 03/30/22 2145)  magnesium oxide (MAG-OX) tablet 400 mg (400 mg Oral Given 03/30/22 1328)  aspirin chewable tablet 324 mg (324 mg Oral Given 03/30/22 0113)  labetalol (NORMODYNE) injection 10 mg (10 mg Intravenous Given 03/30/22 0223)  morphine (PF) 4 MG/ML injection 4 mg (4 mg Intravenous  Given 03/30/22 0244)  iohexol (OMNIPAQUE) 350 MG/ML injection 75 mL (75 mLs Intravenous Contrast Given 03/30/22 0349)  ketorolac (TORADOL) 30 MG/ML injection 30 mg (30 mg Intravenous Given 03/30/22 0502)  metoprolol tartrate (LOPRESSOR) tablet 50 mg (50 mg Oral Given 03/30/22 0529)  methimazole (TAPAZOLE) tablet 10 mg  (10 mg Oral Given 03/30/22 0716)    Reassessment after intervention: Symptoms improved.    I decided to review pertinent External Data, and in summary patient with NSTEMI in June but no occlusive CAD on left heart cath.   Clinical Laboratory Tests Ordered, included troponin negative x 2. THC positive. No AKI. No anemia or leukocytosis. LFTs and bili/lipase normal.   Radiologic Tests Ordered, included CXR and CTA chest. I independently interpreted the images and agree with radiology interpretation.   Cardiac Monitor Tracing which shows sinus tachycardia.   Social Determinants of Health Risk denies EtOH or illicit drug use.   Medical Decision Making: Summary:  Patient with CP similar to prior NSTEMI. Looks uncomfortable. No improvement with Nitro or morphine. Differential is broad as above. He is non-compliant with home medications.   Reevaluation with update and discussion with patient. He is feeling better after Toradol. ? Pericarditis. Tachycardia noted. Will give PO metoprolol and reassess. Thyroid storm also a consideration.   Consultation: Hospitalist for admit.   Disposition: admit  ____________________________________________  FINAL CLINICAL IMPRESSION(S) / ED DIAGNOSES  Final diagnoses:  Precordial chest pain    Note:  This document was prepared using Dragon voice recognition software and may include unintentional dictation errors.  Nanda Quinton, MD, [facility] Emergency Medicine    Lisia Westbay, [facility], MD 03/30/22 [phone]

## 2022-03-30 NOTE — ED Triage Notes (Signed)
Chest pain that started two hours ago, states it feels just like it did when he had a HA back in June

## 2022-03-31 DIAGNOSIS — E0590 Thyrotoxicosis, unspecified without thyrotoxic crisis or storm: Secondary | ICD-10-CM

## 2022-03-31 LAB — TROPONIN I (HIGH SENSITIVITY): Troponin I (High Sensitivity): 15 ng/L (ref <18)

## 2022-03-31 LAB — GLUCOSE, CAPILLARY: Glucose-Capillary: 102 mg/dL — ABNORMAL HIGH (ref 70–99)

## 2022-03-31 LAB — T4, FREE: Free T4: 4.67 ng/dL — ABNORMAL HIGH (ref 0.61–1.12)

## 2022-03-31 MED ORDER — MORPHINE SULFATE (PF) 2 MG/ML IV SOLN: 2.0000 mg | Freq: Once | INTRAVENOUS | Status: AC | PRN

## 2022-03-31 MED ORDER — LORAZEPAM 2 MG/ML IJ SOLN
1.0000 mg | Freq: Three times a day (TID) | INTRAMUSCULAR | Status: DC | PRN
  Administered 2022-03-31: 1 mg via INTRAVENOUS
  Filled 2022-03-31: qty 1

## 2022-03-31 MED ORDER — ONDANSETRON HCL 4 MG/2ML IJ SOLN
4.0000 mg | Freq: Four times a day (QID) | INTRAMUSCULAR | Status: AC | PRN
  Administered 2022-03-31 – 2022-04-01 (×2): 4 mg via INTRAVENOUS
  Filled 2022-03-31 (×2): qty 2

## 2022-03-31 MED ORDER — MORPHINE SULFATE (PF) 2 MG/ML IV SOLN
INTRAVENOUS | Status: AC
  Administered 2022-03-31: 2 mg via INTRAVENOUS
  Filled 2022-03-31: qty 1

## 2022-03-31 MED ORDER — MORPHINE SULFATE (PF) 2 MG/ML IV SOLN: 2.0000 mg | Freq: Four times a day (QID) | INTRAVENOUS | Status: DC | PRN

## 2022-03-31 MED ORDER — LORAZEPAM 2 MG/ML IJ SOLN: 1.0000 mg | Freq: Four times a day (QID) | INTRAMUSCULAR | Status: DC | PRN

## 2022-03-31 NOTE — Plan of Care (Signed)

## 2022-03-31 NOTE — Progress Notes (Signed)
The patient

## 2022-03-31 NOTE — Progress Notes (Signed)
Patient used call light to alert nursing staff about having 10 out of 10 chest pain/pressure. Upon arrival to bedside, patient very restless, vomiting, and stating that he "does not feel good." Patient's vitals taken, nitro given for chest pain x3, CN and rapid response called and also arrived to bedside. Patient restless and noncompliant at first and EKG was not able to be obtained when chest pain was 10/10. Patient then got up and walked to use the rest room after being advised not to get up d/t the situation and condition the patient was in.  Once patient arrived back to bed w/o any issues, 1 mg of ativan given through IV and EKG was finally obtained. After the above interventions patient had no relief so on call night MD paged and immediately arrived to the bedside. MD placed new orders for patient's situation, patient was then given 2 mg of morphine through IV.  After 1 hour passed since ativan given and 30 minutes passed since morphine given, patient resting peacefully and rates pain 5 out of 10.  Night shift CN and RN assigned to this patient were updated and notified of patient's current status. Patient stable and night shift RN advised to follow up on PRN nausea medication that patients asked for.

## 2022-03-31 NOTE — Progress Notes (Signed)
PROGRESS NOTE  Aaron [name]  DOB: [birth year]  PCP: Patient, No Pcp Per [facility]:[id number]  DOA: 03/30/2022  LOS: 0 days  Hospital Day: 2  Brief narrative: Aaron [name] is a 36 y.o. male with PMH significant for HTN, hypERthyroidism, NSTEMI earlier this year with normal coronaries. 11/18, patient presented to ED with complaint of chest pain/chest pressure for 2 hours associated with palpitation, nervousness He also reported some recent heat intolerance.      Chart reviewed.  In June 2023, patient presented with chest pain radiating to the back.  He reported longstanding history of palpitation, sweating, tremor.  EEG heart rate and blood pressure were elevated at the time.  Troponin trended up to peak at 11,000.  Echocardiogram was normal.  He had cardiac cath that showed normal coronaries.  Cardiac MRI was normal as well.  TSH at that time was undetectably low, does have family history of thyroid disease.  TPO antibody and TSI were positive.  Thyroid ultrasound showed heterogeneous enlarged thyroid compatible with medical thyroid disease.[name]  He was started and discharged on methimazole 10 mg 3 times daily and metoprolol XL 25 mg daily.  He was suggested to follow-up with PCP/endocrinologist.  Referral was given.  However patient did not follow-up with anybody.   Patient apparently has not been taking his methimazole or metoprolol for several weeks.   In the ED, patient was afebrile, heart rate in 110s, blood pressure elevated to 182/114, breathing on room air. Unremarkable initial CBC, BMP, except magnesium low at 1.6 Troponin initially was normal at 15, later slightly trended up to 20 TSH level significantly low Urine drug screen positive for [facility] CT angio chest unremarkable. EKG with sinus tachycardia, QTc normal, no ST-T wave changes. Admitted to [facility]  Subjective: Patient was seen and examined this morning.  Young African-American male.  Lying down in bed.  Not in distress.  Playing a game in  big screen TV.  Once he started talking to him, he paused it but started playing game on the phone.  I asked him if he wants me to leave while he is playing.  He said, ' I can listen to you and play the game at the same time.'  But he stopped playing it in my second request. Patient says he is not aware of any issue with thyroid.  Assessment and plan: Chest pain Recent history of elevated troponin Presented with chest pain/pressure for 2 hours associated with palpitation Troponin trend as below.  Repeat troponin this morning showed normalization. Please see above for the details of his recent hospitalization in June for elevated troponin. Patient was supposed to be on metoprolol at home but was not compliant to it. Currently he has been started on metoprolol 50 mg twice daily. Continue to monitor heart rate, blood pressure. Recent Labs    03/30/22 0055 03/30/22 0432 03/30/22 1110 03/31/22 0947  TROPONINIHS 15 15 20* 15   HypERthyroidism See above for discussion on hyperthyroidism from last hospitalization.  He was started on methimazole 10 mg p.o. 3 times daily and was supposed to follow-up with an endocrinologist prior assessment..  Did not follow-up TSH level significantly low.  Obtain free T4 level Methimazole has been resumed. Needs to establish OP care with endocrinology. Recent Labs    10/19/21 1759 03/30/22 0623  TSH <0.010* <0.010*   Essential hypertension Resume metoprolol, but increase to 50 mg p.o. twice daily. Adjust therapy as needed. Beta-blocker need may decrease as hyperthyroidism gets controlled.  Hypomagnesemia Magnesium level low at 1.6 yesterday..  Oral replacement was given. Recent Labs  Lab 03/30/22 0055 03/30/22 1110  K 3.9  --   MG  --  1.6*     Goals of care   Code Status: Full Code    Mobility: Encourage ambulation  Skin assessment:     Nutritional status:  Body mass index is 23.81 kg/m.          Diet:  Diet Order              Diet Heart Room service appropriate? Yes; Fluid consistency: Thin  Diet effective now                   DVT prophylaxis:  SCDs Start: 03/30/22 0656   Antimicrobials: None Fluid: None Consultants: None Family Communication: None at bedside  Status is: Observation  Continue in-hospital care because: Follow-up free T4 level.  Continue monitor blood pressure and heart rate control Level of care: Telemetry   Dispo: The patient is from: Home              Anticipated d/c is to: Hopefully home in 1 to 2 days              Patient currently is not medically stable to d/c.   Difficult to place patient No     Infusions:    Scheduled Meds:  aspirin EC  81 mg Oral Daily   magnesium oxide  400 mg Oral Daily   methimazole  10 mg Oral Q8H   metoprolol tartrate  50 mg Oral BID    PRN meds: acetaminophen **OR** acetaminophen, nitroGLYCERIN   Antimicrobials: Anti-infectives (From admission, onward)    None       Objective: Vitals:   03/31/22 0859 03/31/22 1303  BP: (!) 147/86 (!) 137/95  Pulse: 88 86  Resp: 18 18  Temp: 98 F (36.7 C) 98.5 F (36.9 C)  SpO2: 97% 96%    Intake/Output Summary (Last 24 hours) at 03/31/2022 1430 Last data filed at 03/31/2022 1300 Gross per 24 hour  Intake 960 ml  Output --  Net 960 ml   Filed Weights   03/30/22 0052 03/31/22 0500  Weight: 90.7 kg 86.4 kg   Weight change: -4.319 kg Body mass index is 23.81 kg/m.   Physical Exam: General exam: Young African-American male.  Not in pain Skin: No rashes, lesions or ulcers. HEENT: Atraumatic, normocephalic, no obvious bleeding Lungs: Clear to auscultation bilaterally CVS: Regular rate and rhythm, no murmur GI/Abd soft, nontender, nondistended, bowel sound present CNS: Alert, awake, oriented x3 Psychiatry: Mood appropriate Extremities: No pedal edema, no calf tenderness  Data Review: I have personally reviewed the laboratory data and studies available.  F/u labs  ordered Unresulted Labs (From admission, onward)     Start     Ordered   03/31/22 0930  T4, free  ONCE - URGENT,   URGENT       Question:  Specimen collection method  Answer:  Lab=Lab collect   03/31/22 0930            Signed, Lorin Glass, MD [facility] 03/31/2022

## 2022-03-31 NOTE — Progress Notes (Addendum)
       Overnight   NAME: Aaron [name] MRN: [id number] DOB : October 14, 1985    Date of Service   03/31/2022   HPI/Events of Note   Notified by attending physician and assigned RN for complaint of chest pain/anxiety unrelieved by nitroglycerin.  36 year old male past medical history for hypertension, hyperthyroidism, NSTEMI earlier this year with normal coronaries 03/30/2022 in the ER for complaint of chest pain/chest pressure for 2 hours associated with nervousness. Also reported recent heat intolerance.  Longstanding history of palpitation, sweating, tremor, hypertension, tachycardia.  Family member at bedside states that "this looks exactly like previous episodes' and is "usually relieved by anti-nausea med ".   Prior timeframe: He was started on methimazole 10 mg p.o. 3 times daily and was supposed to follow-up with an endocrinologist prior assessment..  Did not follow-up   Patient has received morphine for his potential increase in myocardial oxygen requirement/pain/BP. -and- Ativan for his stated anxiety as prior.  He remains A&O x 4 and speaks clearly with [facility] and Floor coverage provider about his concerns/ diagnosis and options for treatment etc.     Interventions/ Plan   EKG completed - pending/available. Nitroglycerin given, morphine given. Ativan given-anxiety apparent, availability frequency changed. 2 L/min O2 by nasal cannula in place.       Update:     Patient is asleep and resting with improved Vitals.  (2055 hrs.)  Update: 2200 hrs     Bedside follow up:       Patient is awake and oriented with no obvious or stated difficulties.      He states that his symptoms are resolved and he is easily mobile and pleasant.      He does volunteer that he has anxiety related to his medical history            (hyperthyroid) and possible "other stuff". .      Patient was considering leaving care AMA but currently he has decided to stay           for treatment and family has  resolved visitation situation with the in house            [facility].       He states that he is familiar with the treatments that he is receiving similar to a           stay back in June at which time he had similar signs symptoms "related to my            thyroid"      He is thankful for the resolution with the earlier episode of signs symptoms.         Sat with patient and AC to answer any questions as he states he is " becoming             more familiar with his condition"         Patient states he has no additional questions or concerns at this time and is            offered any other assistance with medication questions or medical questions             that he may have.   Update: 0204 hrs (04/01/22)     Rounding update: per nursing, patient with no problems, concerns or complaints at this time.   Chinita Greenland BSN MSNA MSN ACNPC-AG Acute Care Nurse Practitioner [facility]

## 2022-04-01 ENCOUNTER — Observation Stay (HOSPITAL_BASED_OUTPATIENT_CLINIC_OR_DEPARTMENT_OTHER): Payer: Self-pay

## 2022-04-01 ENCOUNTER — Other Ambulatory Visit (HOSPITAL_COMMUNITY): Payer: Self-pay

## 2022-04-01 DIAGNOSIS — R9431 Abnormal electrocardiogram [ECG] [EKG]: Secondary | ICD-10-CM

## 2022-04-01 LAB — ECHOCARDIOGRAM COMPLETE
Area-P 1/2: 6.22 cm2
Height: 75 in
MV M vel: 3.99 m/s
MV Peak grad: 63.7 mmHg
Radius: 1.9 cm
S' Lateral: 4.6 cm
Weight: 3047.64 oz

## 2022-04-01 MED ORDER — METHIMAZOLE 10 MG PO TABS
10.0000 mg | ORAL_TABLET | Freq: Three times a day (TID) | ORAL | 2 refills | Status: DC
  Filled 2022-04-01: qty 90, 30d supply, fill #0

## 2022-04-01 MED ORDER — METOPROLOL TARTRATE 50 MG PO TABS
50.0000 mg | ORAL_TABLET | Freq: Two times a day (BID) | ORAL | 2 refills | Status: DC
  Filled 2022-04-01: qty 60, 30d supply, fill #0

## 2022-04-01 MED ORDER — MORPHINE SULFATE (PF) 2 MG/ML IV SOLN: 2.0000 mg | Freq: Four times a day (QID) | INTRAVENOUS | Status: DC | PRN

## 2022-04-01 NOTE — Discharge Summary (Signed)
Physician Discharge Summary  Aaron [name] [id number] DOB: 1985/12/23 DOA: 03/30/2022  PCP: Patient, No Pcp Per  Admit date: 03/30/2022 Discharge date: 04/01/2022  Admitted From: Home Discharge disposition: Home  Recommendations at discharge:  Recommend compliance to methimazole.    Brief narrative: Aaron [name] is a 36 y.o. male with PMH significant for HTN, hypERthyroidism, NSTEMI earlier this year with normal coronaries. 11/18, patient presented to ED with complaint of chest pain/chest pressure for 2 hours associated with palpitation, nervousness He also reported some recent heat intolerance.      Chart reviewed.  In June 2023, patient presented with chest pain radiating to the back.  He reported longstanding history of palpitation, sweating, tremor.  EEG heart rate and blood pressure were elevated at the time.  Troponin trended up to peak at 11,000.  Echocardiogram was normal.  He had cardiac cath that showed normal coronaries.  Cardiac MRI was normal as well.  TSH at that time was undetectably low, does have family history of thyroid disease.  TPO antibody and TSI were positive.  Thyroid ultrasound showed heterogeneous enlarged thyroid compatible with medical thyroid disease.[name]  He was started and discharged on methimazole 10 mg 3 times daily and metoprolol XL 25 mg daily.  He was suggested to follow-up with PCP/endocrinologist.  Referral was given.  However patient did not follow-up with anybody.   Patient apparently has not been taking his methimazole or metoprolol for several weeks.   In the ED, patient was afebrile, heart rate in 110s, blood pressure elevated to 182/114, breathing on room air. Unremarkable initial CBC, BMP, except magnesium low at 1.6 Troponin initially was normal at 15, later slightly trended up to 20 TSH level significantly low Urine drug screen positive for [facility] CT angio chest unremarkable. EKG with sinus tachycardia, QTc normal, no ST-T wave  changes. Admitted to [facility]  Subjective: Patient was seen and examined this morning.   Sitting up in bed.  Playing video game in white screen TV.  I asked him if he would like to pass the game while I am talking to him.  He did not want to pause the game. He gave me an angry look and said not to ask him again to pause the game.  He said he is ready for discharge. I got out of the room and called his sister [name] who apparently was here last night and questions about his medical care. She understands the behavioral issues patient has.  She apologized on his behalf.  Assessment and plan: Chest pain Recent history of elevated troponin Presented with chest pain/pressure for 2 hours associated with palpitation Troponin trend as below.  Repeat troponin this morning showed normalization. Please see above for the details of his recent hospitalization in June for elevated troponin. Patient was supposed to be on metoprolol at home but was not compliant to it. Currently he has been started on metoprolol 50 mg twice daily. Continue to monitor heart rate, blood pressure. Recent Labs    03/30/22 0055 03/30/22 0432 03/30/22 1110 03/31/22 0947  TROPONINIHS 15 15 20* 15   HypERthyroidism See above for discussion on hyperthyroidism from last hospitalization.  He was started on methimazole 10 mg p.o. 3 times daily and was supposed to follow-up with an endocrinologist prior assessment..  Did not follow-up TSH level significantly low.  Free T4 level is elevated to 4.6. Methimazole has been resumed.  Discharge on the same. Needs to establish OP care with endocrinology. Recent Labs  10/19/21 1759 03/30/22 0623  TSH <0.010* <0.010*   Essential hypertension Continue metoprolol at 50 mg p.o. twice daily.  Wounds:  -    Discharge Exam:   Vitals:   03/31/22 2203 04/01/22 0215 04/01/22 0647 04/01/22 1003  BP: (!) 145/71 131/79 128/77 131/82  Pulse: 90 75 83 70  Resp: 18 (!) 22 14 18   Temp:  97.7 F (36.5 C) 97.7 F (36.5 C) 98.6 F (37 C) 97.9 F (36.6 C)  TempSrc: Oral Oral Oral Oral  SpO2: 100% 99% 100% 95%  Weight:      Height:        Body mass index is 23.81 kg/m.  General exam: Young African-American male.  Not in pain Skin: No rashes, lesions or ulcers. HEENT: Atraumatic, normocephalic, no obvious bleeding Lungs: Clear to auscultation bilaterally CVS: Regular rate and rhythm, no murmur GI/Abd soft, nontender, nondistended, bowel sound present CNS: Alert, awake, oriented x3 Psychiatry: Mood appropriate Extremities: No pedal edema, no calf tenderness  Follow ups:    Follow-up Information     [facility] Follow up.   Contact information: [address] [place] [phone]                Discharge Instructions:   Discharge Instructions     Call MD for:  difficulty breathing, headache or visual disturbances   Complete by: As directed    Call MD for:  extreme fatigue   Complete by: As directed    Call MD for:  hives   Complete by: As directed    Call MD for:  persistant dizziness or light-headedness   Complete by: As directed    Call MD for:  persistant nausea and vomiting   Complete by: As directed    Call MD for:  severe uncontrolled pain   Complete by: As directed    Call MD for:  temperature >100.4   Complete by: As directed    Diet general   Complete by: As directed    Discharge instructions   Complete by: As directed    Recommendations at discharge:   Recommend compliance to methimazole.   General discharge instructions: Follow with Primary MD Patient, No Pcp Per in 7 days  Please request your PCP  to go over your hospital tests, procedures, radiology results at the follow up. Please get your medicines reviewed and adjusted.  Your PCP may decide to repeat certain labs or tests as needed. Do not drive, operate heavy machinery, perform activities at  heights, swimming or participation in water activities or provide baby sitting services if your were admitted for syncope or siezures until you have seen by Primary MD or a Neurologist and advised to do so again. [place] [phone] Controlled Substance Reporting System database was reviewed. Do not drive, operate heavy machinery, perform activities at heights, swim, participate in water activities or provide baby-sitting services while on medications for pain, sleep and mood until your outpatient physician has reevaluated you and advised to do so again.  You are strongly recommended to comply with the dose, frequency and duration of prescribed medications. Activity: As tolerated with Full fall precautions use walker/cane & assistance as needed Avoid using any recreational substances like cigarette, tobacco, alcohol, or non-prescribed drug. If you experience worsening of your admission symptoms, develop shortness of breath, life threatening emergency, suicidal or homicidal thoughts you must seek medical attention immediately by calling 911 or calling your MD immediately  if  symptoms less severe. You must read complete instructions/literature along with all the possible adverse reactions/side effects for all the medicines you take and that have been prescribed to you. Take any new medicine only after you have completely understood and accepted all the possible adverse reactions/side effects.  Wear Seat belts while driving. You were cared for by a hospitalist during your hospital stay. If you have any questions about your discharge medications or the care you received while you were in the hospital after you are discharged, you can call the unit and ask to speak with the hospitalist or the covering physician. Once you are discharged, your primary care physician will handle any further medical issues. Please note that NO REFILLS for any discharge medications will be authorized once you are discharged, as it is  imperative that you return to your primary care physician (or establish a relationship with a primary care physician if you do not have one).   Increase activity slowly   Complete by: As directed        Discharge Medications:   Allergies as of 04/01/2022   No Known Allergies      Medication List     STOP taking these medications    Aspirin Low Dose 81 MG tablet Generic drug: aspirin EC   metoprolol succinate 25 MG 24 hr tablet Commonly known as: Toprol XL   nitroGLYCERIN 0.4 MG SL tablet Commonly known as: NITROSTAT       TAKE these medications    methimazole 10 MG tablet Commonly known as: TAPAZOLE Take 1 tablet (10 mg total) by mouth every 8 (eight) hours.   metoprolol tartrate 50 MG tablet Commonly known as: LOPRESSOR Take 1 tablet (50 mg total) by mouth 2 (two) times daily.         The results of significant diagnostics from this hospitalization (including imaging, microbiology, ancillary and laboratory) are listed below for reference.    Procedures and Diagnostic Studies:   CT Angio Chest PE W and/or Wo Contrast  Result Date: 03/30/2022 CLINICAL DATA:  Chest pain beginning a couple of days ago, symptoms similar to heart attack in June EXAM: CT ANGIOGRAPHY CHEST WITH CONTRAST TECHNIQUE: Multidetector CT imaging of the chest was performed using the standard protocol during bolus administration of intravenous contrast. Multiplanar CT image reconstructions and MIPs were obtained to evaluate the vascular anatomy. RADIATION DOSE REDUCTION: This exam was performed according to the departmental dose-optimization program which includes automated exposure control, adjustment of the mA and/or kV according to patient size and/or use of iterative reconstruction technique. CONTRAST:  83mL OMNIPAQUE IOHEXOL 350 MG/ML SOLN COMPARISON:  10/10/2008 chest CT report FINDINGS: Cardiovascular: Satisfactory opacification of the pulmonary arteries to the segmental level. No evidence  of pulmonary embolism. Enlarged appearance of the heart on axial slices although less convincing on the scanogram and prior chest x-ray. No pericardial effusion. Mediastinum/Nodes: Negative for adenopathy or mass. Minimal residual thymus without nodularity. Known goiter Lungs/Pleura: There is no edema, consolidation, effusion, or pneumothorax. Upper Abdomen: Negative Musculoskeletal: No acute or focal finding. Review of the MIP images confirms the above findings. IMPRESSION: No acute finding, including pulmonary embolism. Electronically Signed   By: Jorje Guild M.D.   On: 03/30/2022 04:15   DG Chest Port 1 View  Result Date: 03/30/2022 CLINICAL DATA:  Chest pain EXAM: PORTABLE CHEST 1 VIEW COMPARISON:  10/19/2021 FINDINGS: The heart size and mediastinal contours are within normal limits. Both lungs are clear. The visualized skeletal structures are unremarkable. IMPRESSION: No  active disease. Electronically Signed   By: Rolm Baptise M.D.   On: 03/30/2022 01:27     Labs:   Basic Metabolic Panel: Recent Labs  Lab 03/30/22 0055 03/30/22 1110  NA 140  --   K 3.9  --   CL 109  --   CO2 25  --   GLUCOSE 99  --   BUN 14  --   CREATININE 0.58*  --   CALCIUM 9.0  --   MG  --  1.6*   GFR Estimated Creatinine Clearance: 152.6 mL/min (A) (by C-G formula based on SCr of 0.58 mg/dL (L)). Liver Function Tests: Recent Labs  Lab 03/30/22 0055  AST 24  ALT 29  ALKPHOS 112  BILITOT 0.9  PROT 7.5  ALBUMIN 4.2   Recent Labs  Lab 03/30/22 0055  LIPASE 27   No results for input(s): "AMMONIA" in the last 168 hours. Coagulation profile No results for input(s): "INR", "PROTIME" in the last 168 hours.  CBC: Recent Labs  Lab 03/30/22 0055  WBC 6.6  NEUTROABS 3.2  HGB 13.6  HCT 40.6  MCV 79.6*  PLT 254   Cardiac Enzymes: No results for input(s): "CKTOTAL", "CKMB", "CKMBINDEX", "TROPONINI" in the last 168 hours. BNP: Invalid input(s): "POCBNP" CBG: Recent Labs  Lab  03/31/22 1933  GLUCAP 102*   D-Dimer No results for input(s): "DDIMER" in the last 72 hours. Hgb A1c No results for input(s): "HGBA1C" in the last 72 hours. Lipid Profile No results for input(s): "CHOL", "HDL", "LDLCALC", "TRIG", "CHOLHDL", "LDLDIRECT" in the last 72 hours. Thyroid function studies Recent Labs    03/30/22 0623  TSH <0.010*   Anemia work up No results for input(s): "VITAMINB12", "FOLATE", "FERRITIN", "TIBC", "IRON", "RETICCTPCT" in the last 72 hours. Microbiology No results found for this or any previous visit (from the past 240 hour(s)).  Time coordinating discharge: 35 minutes  Signed: Shalamar Crays  [facility] 04/01/2022, 1:39 PM

## 2022-04-01 NOTE — Plan of Care (Signed)

## 2022-04-01 NOTE — Progress Notes (Signed)
Echocardiogram 2D Echocardiogram has been performed.  Leta Jungling M 04/01/2022, 1:36 PM

## 2022-04-01 NOTE — TOC Initial Note (Signed)
Transition of Care [facility]) - Initial/Assessment Note    Patient Details  Name: Aaron [name] MRN: [id number] Date of Birth: Jun 10, 1985  Transition of Care [facility]) CM/SW Contact:    Golda Acre, RN Phone Number: 04/01/2022, 8:49 AM  Clinical Narrative:                  Transition of Care [facility]) Screening Note   Patient Details  Name: Aaron [name] Date of Birth: [birth year]   Transition of Care [facility]) CM/SW Contact:    Golda Acre, RN Phone Number: 04/01/2022, 8:49 AM    Transition of Care Department [facility]) has reviewed patient and no TOC needs have been identified at this time. We will continue to monitor patient advancement through interdisciplinary progression rounds. If new patient transition needs arise, please place a TOC consult.    Expected Discharge Plan: Home/Self Care Barriers to Discharge: Continued Medical Work up   Patient Goals and CMS Choice Patient states their goals for this hospitalization and ongoing recovery are:: to go home CMS Medicare.gov Compare Post Acute Care list provided to:: Patient    Expected Discharge Plan and Services Expected Discharge Plan: Home/Self Care   Discharge Planning Services: CM Consult   Living arrangements for the past 2 months: Single Family Home                                      Prior Living Arrangements/Services Living arrangements for the past 2 months: Single Family Home Lives with:: Self Patient language and need for interpreter reviewed:: Yes Do you feel safe going back to the place where you live?: Yes            Criminal Activity/Legal Involvement Pertinent to Current Situation/Hospitalization: No - Comment as needed  Activities of Daily Living Home Assistive Devices/Equipment: None ADL Screening (condition at time of admission) Patient's cognitive ability adequate to safely complete daily activities?: Yes Is the patient deaf or have difficulty hearing?: No Does the patient have  difficulty seeing, even when wearing glasses/contacts?: No Does the patient have difficulty concentrating, remembering, or making decisions?: No Patient able to express need for assistance with ADLs?: Yes Does the patient have difficulty dressing or bathing?: No Independently performs ADLs?: Yes (appropriate for developmental age) Does the patient have difficulty walking or climbing stairs?: No Weakness of Legs: None Weakness of Arms/Hands: None  Permission Sought/Granted                  Emotional Assessment Appearance:: Appears stated age Attitude/Demeanor/Rapport: Engaged Affect (typically observed): Calm Orientation: : Oriented to Self, Oriented to Place, Oriented to  Time, Oriented to Situation Alcohol / Substance Use: Illicit Drugs Psych Involvement: No (comment)  Admission diagnosis:  Precordial chest pain [R07.2] Chest pain [R07.9] Patient Active Problem List   Diagnosis Date Noted   Chest pain 03/30/2022   Essential hypertension 03/30/2022   Hypomagnesemia 03/30/2022   Hyperthyroidism 10/23/2021   Elevated troponin 10/19/2021   NSTEMI (non-ST elevated myocardial infarction) (HCC) 10/19/2021   PCP:  Patient, No Pcp Per Pharmacy:   [id number] [place], [place] - [address] [place] [address] Phone: [phone] Fax: [phone]  [facility] [address] [place] [address] Phone: [phone] Fax: [phone]     Social Determinants of Health (SDOH) Interventions    Readmission Risk Interventions   No data to  display

## 2022-04-01 NOTE — Progress Notes (Addendum)
The patient has decided to leave AMA. AC and Garner Nash NP are in the room talking to patient.

## 2022-04-01 NOTE — Progress Notes (Signed)
Patient given discharge, medication, and follow up instructions, verbalized understanding, CM gave letter for [facility] program, IV and telemetry monitor removed, all personal belongings with patient, will transport self home

## 2022-04-01 NOTE — TOC Progression Note (Signed)
Transition of Care [facility]) - Progression Note    Patient Details  Name: Aaron [name] MRN: [id number] Date of Birth: May 08, 1986  Transition [facility]) CM/SW Contact  Dareld Mcauliffe, Olegario Messier, RN Phone Number: 04/01/2022, 4:09 PM  Clinical Narrative: MATCH Medication Assistance Card  Name: Jed Kutch ID (MRN): [id number] Bin: [id number] RX Group: [id number] Discharge Date: 04/01/22 Expiration Date:04/08/22                                          (must be filled within 7 days of discharge)     You have been approved to have the prescriptions written by your discharging physician filled through our [facility] (Medication Assistance Through [facility]) program. This program allows for a one-time (no refills) 34-day supply of selected medications for a low copay amount.  The copay is $3.00 per prescription. For instance, if you have one prescription, you will pay $3.00; for two prescriptions, you pay $6.00; for three prescriptions, you pay $9.00; and so on.  Only certain pharmacies are participating in this program with [facility]. You will need to select one of the pharmacies from the attached list and take your prescriptions, this letter, and your photo ID to one of the [facility] Outpatient pharmacies, [facility], [facility] [address] or [facility] [address].   We are excited that you are able to use the [facility] program to get your medications. These prescriptions must be filled within 7 days of hospital discharge or they will no longer be valid for the [facility] program. Should you have any problems with your prescriptions please contact your case management team member at [phone] for Patrcia Dolly [facility].  Thank you, [facility] Health Care Management          Expected Discharge Plan: Home/Self Care Barriers to Discharge: Barriers Resolved  Expected Discharge Plan and Services Expected Discharge Plan: Home/Self Care    Discharge Planning Services: CM Consult   Living arrangements for the past 2 months: Single Family Home Expected Discharge Date: 04/01/22                                     Social Determinants of Health (SDOH) Interventions    Readmission Risk Interventions     No data to display

## 2022-04-01 NOTE — TOC Transition Note (Signed)
Transition of Care [facility]) - CM/SW Discharge Note   Patient Details  Name: Aaron [name] MRN: [id number] Date of Birth: [birth year]  Transition of Care [facility]) CM/SW Contact:  Golda Acre, RN Phone Number: 04/01/2022, 2:11 PM   Clinical Narrative:    Patient dcd to return home with self care.  No toc needs present.   Final next level of care: Home/Self Care Barriers to Discharge: Barriers Resolved   Patient Goals and CMS Choice Patient states their goals for this hospitalization and ongoing recovery are:: to go home CMS Medicare.gov Compare Post Acute Care list provided to:: Patient    Discharge Placement                       Discharge Plan and Services   Discharge Planning Services: CM Consult                                 Social Determinants of Health (SDOH) Interventions     Readmission Risk Interventions   No data to display

## 2022-04-01 NOTE — Progress Notes (Signed)
Patient's sister, Pierson Vantol, with concerns regarding pt receiving morphine for chest pain. Assured sister that giving morphine was protocol for chest pain. Patient's sister concerned that morphine was given to sedate patient instead of help him. Assured sister that this was not the case. Sister was not receptive. Sister expressed a desire to speak with house supervisor. House supervisor made aware and to bedside. On-call also to bedside.

## 2023-03-20 ENCOUNTER — Inpatient Hospital Stay (HOSPITAL_COMMUNITY): Payer: Medicaid Other

## 2023-03-20 ENCOUNTER — Inpatient Hospital Stay (HOSPITAL_COMMUNITY)
Admission: EM | Admit: 2023-03-20 | Discharge: 2023-03-22 | DRG: 310 | Disposition: A | Discharge status: Home / Self Care | Payer: Medicaid Other | Attending: Family Medicine | Admitting: Family Medicine

## 2023-03-20 ENCOUNTER — Other Ambulatory Visit: Payer: Self-pay

## 2023-03-20 ENCOUNTER — Encounter (HOSPITAL_COMMUNITY): Payer: Self-pay

## 2023-03-20 ENCOUNTER — Emergency Department (HOSPITAL_COMMUNITY): Payer: Medicaid Other

## 2023-03-20 DIAGNOSIS — I480 Paroxysmal atrial fibrillation: Principal | ICD-10-CM | POA: Diagnosis present

## 2023-03-20 DIAGNOSIS — I341 Nonrheumatic mitral (valve) prolapse: Secondary | ICD-10-CM | POA: Diagnosis not present

## 2023-03-20 DIAGNOSIS — E0590 Thyrotoxicosis, unspecified without thyrotoxic crisis or storm: Secondary | ICD-10-CM | POA: Diagnosis not present

## 2023-03-20 DIAGNOSIS — E039 Hypothyroidism, unspecified: Secondary | ICD-10-CM | POA: Diagnosis not present

## 2023-03-20 DIAGNOSIS — I4891 Unspecified atrial fibrillation: Secondary | ICD-10-CM | POA: Diagnosis not present

## 2023-03-20 DIAGNOSIS — Z91148 Patient's other noncompliance with medication regimen for other reason: Secondary | ICD-10-CM

## 2023-03-20 DIAGNOSIS — I252 Old myocardial infarction: Secondary | ICD-10-CM | POA: Diagnosis not present

## 2023-03-20 DIAGNOSIS — Z79899 Other long term (current) drug therapy: Secondary | ICD-10-CM | POA: Diagnosis not present

## 2023-03-20 DIAGNOSIS — I10 Essential (primary) hypertension: Secondary | ICD-10-CM | POA: Diagnosis not present

## 2023-03-20 DIAGNOSIS — I340 Nonrheumatic mitral (valve) insufficiency: Secondary | ICD-10-CM | POA: Diagnosis present

## 2023-03-20 DIAGNOSIS — E0500 Thyrotoxicosis with diffuse goiter without thyrotoxic crisis or storm: Secondary | ICD-10-CM | POA: Diagnosis present

## 2023-03-20 DIAGNOSIS — E042 Nontoxic multinodular goiter: Secondary | ICD-10-CM | POA: Diagnosis present

## 2023-03-20 LAB — ECHOCARDIOGRAM COMPLETE
AR max vel: 2.3 cm2
AV Area VTI: 2.17 cm2
AV Area mean vel: 2.09 cm2
AV Mean grad: 4 mm[Hg]
AV Peak grad: 6.5 mm[Hg]
Ao pk vel: 1.27 m/s
Area-P 1/2: 4.33 cm2
Est EF: 50
Height: 76 in
MV VTI: 0.49 cm2
S' Lateral: 3.8 cm
Weight: 3280 [oz_av]

## 2023-03-20 LAB — CBC WITH DIFFERENTIAL/PLATELET
Abs Immature Granulocytes: 0.01 10*3/uL (ref 0.00–0.07)
Basophils Absolute: 0 10*3/uL (ref 0.0–0.1)
Basophils Relative: 1 %
Eosinophils Absolute: 0.1 10*3/uL (ref 0.0–0.5)
Eosinophils Relative: 1 %
HCT: 39.5 % (ref 39.0–52.0)
Hemoglobin: 12.6 g/dL — ABNORMAL LOW (ref 13.0–17.0)
Immature Granulocytes: 0 %
Lymphocytes Relative: 33 %
Lymphs Abs: 1.8 10*3/uL (ref 0.7–4.0)
MCH: 23.7 pg — ABNORMAL LOW (ref 26.0–34.0)
MCHC: 31.9 g/dL (ref 30.0–36.0)
MCV: 74.4 fL — ABNORMAL LOW (ref 80.0–100.0)
Monocytes Absolute: 1.2 10*3/uL — ABNORMAL HIGH (ref 0.1–1.0)
Monocytes Relative: 21 %
Neutro Abs: 2.4 10*3/uL (ref 1.7–7.7)
Neutrophils Relative %: 44 %
Platelets: 318 10*3/uL (ref 150–400)
RBC: 5.31 MIL/uL (ref 4.22–5.81)
RDW: 14.6 % (ref 11.5–15.5)
WBC: 5.5 10*3/uL (ref 4.0–10.5)
nRBC: 0 % (ref 0.0–0.2)

## 2023-03-20 LAB — BASIC METABOLIC PANEL
Anion gap: 11 (ref 5–15)
BUN: 13 mg/dL (ref 6–20)
CO2: 23 mmol/L (ref 22–32)
Calcium: 9.1 mg/dL (ref 8.9–10.3)
Chloride: 103 mmol/L (ref 98–111)
Creatinine, Ser: 0.6 mg/dL — ABNORMAL LOW (ref 0.61–1.24)
GFR, Estimated: >60 mL/min (ref >60)
Glucose, Bld: 128 mg/dL — ABNORMAL HIGH (ref 70–99)
Potassium: 3.7 mmol/L (ref 3.5–5.1)
Sodium: 137 mmol/L (ref 135–145)

## 2023-03-20 LAB — APTT: aPTT: 30 s (ref 24–36)

## 2023-03-20 LAB — PROTIME-INR
INR: 1.1 (ref 0.8–1.2)
Prothrombin Time: 14.8 s (ref 11.4–15.2)

## 2023-03-20 LAB — HIV ANTIBODY (ROUTINE TESTING W REFLEX): HIV Screen 4th Generation wRfx: NONREACTIVE

## 2023-03-20 LAB — TROPONIN I (HIGH SENSITIVITY)
Troponin I (High Sensitivity): 18 ng/L — ABNORMAL HIGH (ref <18)
Troponin I (High Sensitivity): 15 ng/L (ref <18)

## 2023-03-20 LAB — MRSA NEXT GEN BY PCR, NASAL: MRSA by PCR Next Gen: NOT DETECTED

## 2023-03-20 LAB — HEPARIN LEVEL (UNFRACTIONATED)
Heparin Unfractionated: 0.17 [IU]/mL — ABNORMAL LOW (ref 0.30–0.70)
Heparin Unfractionated: 0.28 [IU]/mL — ABNORMAL LOW (ref 0.30–0.70)

## 2023-03-20 LAB — MAGNESIUM: Magnesium: 1.8 mg/dL (ref 1.7–2.4)

## 2023-03-20 LAB — T4, FREE: Free T4: 4.55 ng/dL — ABNORMAL HIGH (ref 0.61–1.12)

## 2023-03-20 LAB — TSH: TSH: <0.01 u[IU]/mL — ABNORMAL LOW (ref 0.350–4.500)

## 2023-03-20 MED ORDER — ONDANSETRON HCL 4 MG/2ML IJ SOLN: 4.0000 mg | Freq: Four times a day (QID) | INTRAMUSCULAR | Status: DC | PRN

## 2023-03-20 MED ORDER — ALBUTEROL SULFATE (2.5 MG/3ML) 0.083% IN NEBU: 2.5000 mg | INHALATION_SOLUTION | RESPIRATORY_TRACT | Status: DC | PRN

## 2023-03-20 MED ORDER — HEPARIN (PORCINE) 25000 UT/250ML-% IV SOLN
1750.0000 [IU]/h | INTRAVENOUS | Status: DC
  Administered 2023-03-20: 1600 [IU]/h via INTRAVENOUS
  Administered 2023-03-21: 1750 [IU]/h via INTRAVENOUS
  Filled 2023-03-20 (×2): qty 250

## 2023-03-20 MED ORDER — DILTIAZEM LOAD VIA INFUSION
10.0000 mg | Freq: Once | INTRAVENOUS | Status: AC
  Administered 2023-03-20: 10 mg via INTRAVENOUS
  Filled 2023-03-20: qty 10

## 2023-03-20 MED ORDER — ONDANSETRON HCL 4 MG PO TABS: 4.0000 mg | ORAL_TABLET | Freq: Four times a day (QID) | ORAL | Status: DC | PRN

## 2023-03-20 MED ORDER — ACETAMINOPHEN 650 MG RE SUPP: 650.0000 mg | Freq: Four times a day (QID) | RECTAL | Status: DC | PRN

## 2023-03-20 MED ORDER — HEPARIN (PORCINE) 25000 UT/250ML-% IV SOLN: 12.0000 [IU]/kg/h | INTRAVENOUS | Status: DC

## 2023-03-20 MED ORDER — HEPARIN BOLUS VIA INFUSION
4000.0000 [IU] | Freq: Once | INTRAVENOUS | Status: AC
  Administered 2023-03-20: 4000 [IU] via INTRAVENOUS
  Filled 2023-03-20: qty 4000

## 2023-03-20 MED ORDER — METOPROLOL TARTRATE 25 MG PO TABS
25.0000 mg | ORAL_TABLET | Freq: Two times a day (BID) | ORAL | Status: DC
  Administered 2023-03-20 – 2023-03-22 (×5): 25 mg via ORAL
  Filled 2023-03-20 (×5): qty 1

## 2023-03-20 MED ORDER — HEPARIN BOLUS VIA INFUSION
3000.0000 [IU] | Freq: Once | INTRAVENOUS | Status: AC
Start: 2023-03-20 — End: 2023-03-20
  Administered 2023-03-20: 3000 [IU] via INTRAVENOUS
  Filled 2023-03-20: qty 3000

## 2023-03-20 MED ORDER — METHIMAZOLE 10 MG PO TABS
10.0000 mg | ORAL_TABLET | Freq: Three times a day (TID) | ORAL | Status: DC
  Administered 2023-03-20 – 2023-03-22 (×6): 10 mg via ORAL
  Filled 2023-03-20 (×7): qty 1

## 2023-03-20 MED ORDER — HYDROMORPHONE HCL 1 MG/ML IJ SOLN
1.0000 mg | Freq: Once | INTRAMUSCULAR | Status: AC
  Administered 2023-03-20: 1 mg via INTRAVENOUS
  Filled 2023-03-20: qty 1

## 2023-03-20 MED ORDER — HEPARIN (PORCINE) 25000 UT/250ML-% IV SOLN
1300.0000 [IU]/h | INTRAVENOUS | Status: DC
  Administered 2023-03-20: 1300 [IU]/h via INTRAVENOUS
  Filled 2023-03-20: qty 250

## 2023-03-20 MED ORDER — TRAZODONE HCL 50 MG PO TABS
25.0000 mg | ORAL_TABLET | Freq: Every evening | ORAL | Status: DC | PRN
  Administered 2023-03-21: 25 mg via ORAL
  Filled 2023-03-20: qty 1

## 2023-03-20 MED ORDER — POTASSIUM CHLORIDE CRYS ER 20 MEQ PO TBCR
40.0000 meq | EXTENDED_RELEASE_TABLET | Freq: Once | ORAL | Status: AC
  Administered 2023-03-20: 40 meq via ORAL
  Filled 2023-03-20: qty 2

## 2023-03-20 MED ORDER — METHIMAZOLE 10 MG PO TABS
20.0000 mg | ORAL_TABLET | ORAL | Status: AC
  Administered 2023-03-20: 20 mg via ORAL
  Filled 2023-03-20: qty 2

## 2023-03-20 MED ORDER — ACETAMINOPHEN 325 MG PO TABS
650.0000 mg | ORAL_TABLET | Freq: Four times a day (QID) | ORAL | Status: DC | PRN
  Administered 2023-03-20: 650 mg via ORAL
  Filled 2023-03-20: qty 2

## 2023-03-20 MED ORDER — CHLORHEXIDINE GLUCONATE CLOTH 2 % EX PADS
6.0000 | MEDICATED_PAD | Freq: Every day | CUTANEOUS | Status: DC
  Administered 2023-03-20 – 2023-03-21 (×2): 6 via TOPICAL

## 2023-03-20 MED ORDER — DILTIAZEM HCL-DEXTROSE 125-5 MG/125ML-% IV SOLN (PREMIX)
5.0000 mg/h | INTRAVENOUS | Status: DC
  Administered 2023-03-20: 5 mg/h via INTRAVENOUS
  Administered 2023-03-20: 15 mg/h via INTRAVENOUS
  Administered 2023-03-20 – 2023-03-21 (×2): 10 mg/h via INTRAVENOUS
  Filled 2023-03-20 (×4): qty 125

## 2023-03-20 NOTE — Progress Notes (Signed)
Echocardiogram 2D Echocardiogram has been performed.  Aaron [name] 03/20/2023, 3:05 PM

## 2023-03-20 NOTE — ED Notes (Signed)
Call to ICU regarding ready bed, they will contact bed placement and I await notification of bed being ready

## 2023-03-20 NOTE — Progress Notes (Signed)
PHARMACY - ANTICOAGULATION CONSULT NOTE  Pharmacy Consult for Heparin Indication: atrial fibrillation  No Known Allergies  Patient Measurements: Height: 6\' 4"  (193 cm) Weight: 93 kg (205 lb) IBW/kg (Calculated) : 86.8 Heparin Dosing Weight: actual body weight  Vital Signs: Temp: 98 F (36.7 C) (11/07 0424) Temp Source: Oral (11/07 0424) BP: 135/96 (11/07 0424) Pulse Rate: 131 (11/07 0424)  Labs: Recent Labs    03/20/23 0429  HGB 12.6*  HCT 39.5  PLT 318    CrCl cannot be calculated (Patient's most recent lab result is older than the maximum 21 days allowed.).   Medical History: Past Medical History:  Diagnosis Date   Medical history non-contributory     Medications:  No oral anticoagulation PTA  Assessment: 37 yr male with c/o chest and back pain PMH heart attack H&P not available at this time.  Confirmed with Dr Nicanor Alcon indication for heparin is AFib  Goal of Therapy:  Heparin level 0.3-0.7 units/ml Monitor platelets by anticoagulation protocol: Yes   Plan:  Baseline aPTT and PT/INR Heparin 4000 unit IV bolus x 1 Heparin gtt @ 1300 units/hr Check heparin level 6 hr after infusion started Daily HL & CBC Monitor for signs & symptoms of bleeding  Teshaun Olarte, Joselyn Glassman, PharmD 03/20/2023,4:47 AM

## 2023-03-20 NOTE — H&P (Signed)
History and Physical  Aaron [name] ZOX:[id number] DOB: Aug 10, 1985 DOA: 03/20/2023  PCP: Patient, No Pcp Per   Chief Complaint: Chest discomfort  HPI: Aaron [name] is a 37 y.o. male with medical history significant for hypertension and hypothyroidism and medication noncompliance being admitted to the hospital with new onset atrial fibrillation with RVR.  This gentleman has had multiple hospital admissions due to tachycardia and chest discomfort related to noncompliance.  He was also previously admitted with NSTEMI and had workup including cardiac catheterization which showed clean coronaries.  He tells me that he is really never taken his medications or followed up as an outpatient after hospital discharge.  Essentially tells me that he does not trust that he truly needs the medication.  States that he typically has some chest discomfort and palpitations as well as dizziness throughout his days.  Yesterday, he experienced worse chest discomfort and palpitations that he had previously, so he came to the ER for evaluation.  Here, he was found to be hemodynamically stable but in new onset rapid atrial fibrillation.  He was started on IV heparin drip, IV Cardizem drip, and his home methimazole was resumed as well.  Currently he feels a little better.  Review of Systems: Please see HPI for pertinent positives and negatives. A complete 10 system review of systems are otherwise negative.  Past Medical History:  Diagnosis Date   Medical history non-contributory    Past Surgical History:  Procedure Laterality Date   LEFT HEART CATH AND CORONARY ANGIOGRAPHY N/A 10/22/2021   Procedure: LEFT HEART CATH AND CORONARY ANGIOGRAPHY;  Surgeon: Yvonne Kendall, MD;  Location: [facility];  Service: Cardiovascular;  Laterality: N/A;   NO PAST SURGERIES      Social History:  reports that he has never smoked. He has never used smokeless tobacco. He reports current drug use. Frequency: 3.00 times per week.  Drug: Marijuana. He reports that he does not drink alcohol.   No Known Allergies  Family History  Problem Relation Age of Onset   Heart disease Other      Prior to Admission medications   Medication Sig Start Date End Date Taking? Authorizing Provider  methimazole (TAPAZOLE) 10 MG tablet Take 1 tablet (10 mg total) by mouth every 8 (eight) hours. Patient not taking: Reported on 03/20/2023 04/01/22 06/30/22  Lorin Glass, MD  metoprolol tartrate (LOPRESSOR) 50 MG tablet Take 1 tablet (50 mg total) by mouth 2 (two) times daily. Patient not taking: Reported on 03/20/2023 04/01/22 06/30/22  Lorin Glass, MD    Physical Exam: BP 123/68   Pulse 82   Temp 98.3 F (36.8 C) (Oral)   Resp (!) 25   Ht 6\' 4"  (1.93 m)   Wt 93 kg   SpO2 99%   BMI 24.95 kg/m   General: Healthy appearing young man appearing his stated age, resting comfortably. Eyes: EOMI, clear conjuctivae, white sclerea Neck: supple, no masses, trachea mildline  Cardiovascular: Tachycardic and regular, no murmurs or rubs, no peripheral edema  Respiratory: clear to auscultation bilaterally, no wheezes, no crackles  Abdomen: soft, nontender, nondistended, normal bowel tones heard  Skin: dry, no rashes  Musculoskeletal: no joint effusions, normal range of motion  Psychiatric: appropriate affect, normal speech, pleasant and cooperative Neurologic: extraocular muscles intact, clear speech, moving all extremities with intact sensorium         Labs on Admission:  Basic Metabolic Panel: Recent Labs  Lab 03/20/23 0429  NA 137  K 3.7  CL  103  CO2 23  GLUCOSE 128*  BUN 13  CREATININE 0.60*  CALCIUM 9.1   Liver Function Tests: No results for input(s): "AST", "ALT", "ALKPHOS", "BILITOT", "PROT", "ALBUMIN" in the last 168 hours. No results for input(s): "LIPASE", "AMYLASE" in the last 168 hours. No results for input(s): "AMMONIA" in the last 168 hours. CBC: Recent Labs  Lab 03/20/23 0429  WBC 5.5  NEUTROABS 2.4   HGB 12.6*  HCT 39.5  MCV 74.4*  PLT 318   Cardiac Enzymes: No results for input(s): "CKTOTAL", "CKMB", "CKMBINDEX", "TROPONINI" in the last 168 hours.  BNP (last 3 results) No results for input(s): "BNP" in the last 8760 hours.  ProBNP (last 3 results) No results for input(s): "PROBNP" in the last 8760 hours.  CBG: No results for input(s): "GLUCAP" in the last 168 hours.  Radiological Exams on Admission: DG Chest Portable 1 View  Result Date: 03/20/2023 CLINICAL DATA:  37 year old male with chest and upper back pain for 24 hours. Extremity tingling. History of MI. EXAM: PORTABLE CHEST 1 VIEW COMPARISON:  CTA chest 03/30/2022 and earlier. FINDINGS: Portable AP semi upright view at 0431 hours. Stable cardiomegaly and mediastinal contours. Visualized tracheal air column is within normal limits. Lung volumes are stable and within normal limits. Allowing for portable technique the lungs are clear. No pneumothorax or pleural effusion. No acute osseous abnormality identified. Negative visible bowel gas. IMPRESSION: Stable cardiomegaly. No acute cardiopulmonary abnormality. Electronically Signed   By: Odessa Fleming M.D.   On: 03/20/2023 04:52    Assessment/Plan Aaron [name] is a 37 y.o. male with medical history significant for hypertension and hypothyroidism and medication noncompliance being admitted to the hospital with new onset atrial fibrillation with RVR.   Atrial fibrillation with RVR-presumably due to noncompliance with his home beta-blocker and methimazole.  Hemodynamically stable without chest pain, and thankfully troponin is unremarkable. -Inpatient admission to stepdown -Continue IV Cardizem drip and titrate per protocol -Resume home beta-blocker, at reduced dose for now, will titrate up if tolerating well -IV heparin drip -Check 2D echo -Will maintain K greater than 4, Mg greater than 2  Hypothyroidism-diagnosed in June 2023, when he had undetectable TSH, TPO antibody and TSI were  positive.  At that time, he has been prescribed methimazole 10 mg p.o. 3 times daily but has never taken it outside of the hospital. -Check T4, T3 -Resume methimazole  Medication noncompliance-Long discussion with the patient explaining rationale of his medications, and the importance of medication and follow-up compliance.  Denies financial or other constraints.  He seems receptive during our conversation.  Unfortunately this is likely to continue to be a challenge for him.  DVT prophylaxis: Heparin drip    Code Status: Full Code  Consults called: None  Admission status: The appropriate patient status for this patient is INPATIENT. Inpatient status is judged to be reasonable and necessary in order to provide the required intensity of service to ensure the patient's safety. The patient's presenting symptoms, physical exam findings, and initial radiographic and laboratory data in the context of their chronic comorbidities is felt to place them at high risk for further clinical deterioration. Furthermore, it is not anticipated that the patient will be medically stable for discharge from the hospital within 2 midnights of admission.    I certify that at the point of admission it is my clinical judgment that the patient will require inpatient hospital care spanning beyond 2 midnights from the point of admission due to high intensity of service, high  risk for further deterioration and high frequency of surveillance required  Time spent: 59 minutes  Jaleiah Asay Sharlette Dense MD [facility] Pager [phone]  If 7PM-7AM, please contact night-coverage [email or web address] Password [facility]  03/20/2023, 9:46 AM

## 2023-03-20 NOTE — Progress Notes (Signed)
PHARMACY - ANTICOAGULATION CONSULT NOTE  Pharmacy Consult for Heparin Indication: atrial fibrillation  No Known Allergies  Patient Measurements: Height: 6\' 4"  (193 cm) Weight: 93 kg (205 lb) IBW/kg (Calculated) : 86.8 Heparin Dosing Weight:  93 kg  Vital Signs: Temp: 98.9 F (37.2 C) (11/07 1600) Temp Source: Oral (11/07 1600) BP: 111/57 (11/07 1900) Pulse Rate: 79 (11/07 1900)  Labs: Recent Labs    03/20/23 0429 03/20/23 0458 03/20/23 0624 03/20/23 1112 03/20/23 1845  HGB 12.6*  --   --   --   --   HCT 39.5  --   --   --   --   PLT 318  --   --   --   --   APTT  --  30  --   --   --   LABPROT  --  14.8  --   --   --   INR  --  1.1  --   --   --   HEPARINUNFRC  --   --   --  0.17* 0.28*  CREATININE 0.60*  --   --   --   --   TROPONINIHS 18*  --  15  --   --     Estimated Creatinine Clearance: 155.2 mL/min (A) (by C-G formula based on SCr of 0.6 mg/dL (L)).   Medical History: Past Medical History:  Diagnosis Date   Medical history non-contributory     Assessment:  AC/Heme: AFib. IV heparin - HL 0.17>>0.28 Goal of Therapy:  Heparin level 0.3-0.7 units/ml Monitor platelets by anticoagulation protocol: Yes   Plan:  Increase IV heparin to 1750 units/hr Daily HL and CBC   Tailyn Hantz S. Merilynn Finland, PharmD, BCPS Clinical Staff Pharmacist [email or web address] Merilynn Finland, Mylissa Lambe Stillinger 03/20/2023,7:28 PM

## 2023-03-20 NOTE — ED Notes (Signed)
Floor was called, await confirmation that pt may go up

## 2023-03-20 NOTE — ED Triage Notes (Signed)
Pt reports upper chest and back pain since yesterday, always has since heart attack last year but got worse and intermittent shob. Also reports left leg and arm tingling.

## 2023-03-20 NOTE — ED Provider Notes (Signed)
[place] EMERGENCY DEPARTMENT AT [facility] Provider Note   CSN: [id number] Arrival date & time: 03/20/23  0405     History  Chief Complaint  Patient presents with   Chest Pain   Leg Pain    Aaron [name] is a 37 y.o. male.  The history is provided by the patient.  Chest Pain Pain location:  Substernal area Pain radiates to:  Does not radiate Pain severity:  Moderate Onset quality:  Sudden Timing:  Constant Progression:  Unchanged Context: at rest   Context comment:  Drank 2 glasses of wine and smoke marijuana Relieved by:  Nothing Worsened by:  Nothing Ineffective treatments:  None tried Associated symptoms: palpitations   Associated symptoms: no fever, no lower extremity edema and no weakness   Risk factors: male sex   Patient with hyperthyroidism who has not taken his lopressor or methimazole since discharge from the hospital last year presents with chest pain and palpitations.       Home Medications Prior to Admission medications   Medication Sig Start Date End Date Taking? Authorizing Provider  methimazole (TAPAZOLE) 10 MG tablet Take 1 tablet (10 mg total) by mouth every 8 (eight) hours. 04/01/22 06/30/22  Lorin Glass, MD  metoprolol tartrate (LOPRESSOR) 50 MG tablet Take 1 tablet (50 mg total) by mouth 2 (two) times daily. 04/01/22 06/30/22  Lorin Glass, MD      Allergies    Patient has no known allergies.    Review of Systems   Review of Systems  Constitutional:  Negative for fever.  HENT:  Negative for facial swelling.   Respiratory:  Negative for wheezing and stridor.   Cardiovascular:  Positive for chest pain and palpitations.  Musculoskeletal:  Positive for arthralgias.  Neurological:  Negative for weakness.  All other systems reviewed and are negative.   Physical Exam Updated Vital Signs BP (!) 172/94   Pulse 98   Temp 98 F (36.7 C) (Oral)   Resp (!) 31   Ht 6\' 4"  (1.93 m)   Wt 93 kg   SpO2 99%   BMI 24.95 kg/m   Physical Exam Vitals and nursing note reviewed.  Constitutional:      General: He is not in acute distress.    Appearance: Normal appearance. He is well-developed. He is not diaphoretic.  HENT:     Head: Normocephalic and atraumatic.     Nose: Nose normal.  Eyes:     Conjunctiva/sclera: Conjunctivae normal.     Pupils: Pupils are equal, round, and reactive to light.  Cardiovascular:     Rate and Rhythm: Tachycardia present. Rhythm irregular.  Pulmonary:     Effort: Pulmonary effort is normal.     Breath sounds: Normal breath sounds. No wheezing or rales.  Abdominal:     General: Bowel sounds are normal.     Palpations: Abdomen is soft.     Tenderness: There is no abdominal tenderness. There is no guarding or rebound.  Musculoskeletal:        General: Normal range of motion.     Cervical back: Normal range of motion and neck supple.  Skin:    General: Skin is warm and dry.     Capillary Refill: Capillary refill takes less than 2 seconds.  Neurological:     General: No focal deficit present.     Mental Status: He is alert and oriented to person, place, and time.     Sensory: No sensory deficit.  Deep Tendon Reflexes: Reflexes normal.     Comments: BLE NVI  Psychiatric:        Mood and Affect: Mood normal.     ED Results / Procedures / Treatments   Labs (all labs ordered are listed, but only abnormal results are displayed) Results for orders placed or performed during the hospital encounter of 03/20/23  CBC with Differential  Result Value Ref Range   WBC 5.5 4.0 - 10.5 K/uL   RBC 5.31 4.22 - 5.81 MIL/uL   Hemoglobin 12.6 (L) 13.0 - 17.0 g/dL   HCT 16.1 09.6 - 04.5 %   MCV 74.4 (L) 80.0 - 100.0 fL   MCH 23.7 (L) 26.0 - 34.0 pg   MCHC 31.9 30.0 - 36.0 g/dL   RDW 40.9 81.1 - 91.4 %   Platelets 318 150 - 400 K/uL   nRBC 0.0 0.0 - 0.2 %   Neutrophils Relative % 44 %   Neutro Abs 2.4 1.7 - 7.7 K/uL   Lymphocytes Relative 33 %   Lymphs Abs 1.8 0.7 - 4.0 K/uL    Monocytes Relative 21 %   Monocytes Absolute 1.2 (H) 0.1 - 1.0 K/uL   Eosinophils Relative 1 %   Eosinophils Absolute 0.1 0.0 - 0.5 K/uL   Basophils Relative 1 %   Basophils Absolute 0.0 0.0 - 0.1 K/uL   Immature Granulocytes 0 %   Abs Immature Granulocytes 0.01 0.00 - 0.07 K/uL  Basic metabolic panel  Result Value Ref Range   Sodium 137 135 - 145 mmol/L   Potassium 3.7 3.5 - 5.1 mmol/L   Chloride 103 98 - 111 mmol/L   CO2 23 22 - 32 mmol/L   Glucose, Bld 128 (H) 70 - 99 mg/dL   BUN 13 6 - 20 mg/dL   Creatinine, Ser 7.82 (L) 0.61 - 1.24 mg/dL   Calcium 9.1 8.9 - 95.6 mg/dL   GFR, Estimated >21 >30 mL/min   Anion gap 11 5 - 15  TSH  Result Value Ref Range   TSH <0.010 (L) 0.350 - 4.500 uIU/mL  APTT  Result Value Ref Range   aPTT 30 24 - 36 seconds  Protime-INR  Result Value Ref Range   Prothrombin Time 14.8 11.4 - 15.2 seconds   INR 1.1 0.8 - 1.2  Troponin I (High Sensitivity)  Result Value Ref Range   Troponin I (High Sensitivity) 18 (H) <18 ng/L   DG Chest Portable 1 View  Result Date: 03/20/2023 CLINICAL DATA:  37 year old male with chest and upper back pain for 24 hours. Extremity tingling. History of MI. EXAM: PORTABLE CHEST 1 VIEW COMPARISON:  CTA chest 03/30/2022 and earlier. FINDINGS: Portable AP semi upright view at 0431 hours. Stable cardiomegaly and mediastinal contours. Visualized tracheal air column is within normal limits. Lung volumes are stable and within normal limits. Allowing for portable technique the lungs are clear. No pneumothorax or pleural effusion. No acute osseous abnormality identified. Negative visible bowel gas. IMPRESSION: Stable cardiomegaly. No acute cardiopulmonary abnormality. Electronically Signed   By: Odessa Fleming M.D.   On: 03/20/2023 04:52     EKG EKG Interpretation Date/Time:  Thursday March 20 2023 04:21:08 EST Ventricular Rate:  157 PR Interval:    QRS Duration:  79 QT Interval:  305 QTC Calculation: 493 R Axis:   85  Text  Interpretation: Atrial fibrillation Confirmed by Abryana Lykens ([id number]) on 03/20/2023 5:24:52 AM  Radiology DG Chest Portable 1 View  Result Date: 03/20/2023 CLINICAL DATA:  37 year old male  with chest and upper back pain for 24 hours. Extremity tingling. History of MI. EXAM: PORTABLE CHEST 1 VIEW COMPARISON:  CTA chest 03/30/2022 and earlier. FINDINGS: Portable AP semi upright view at 0431 hours. Stable cardiomegaly and mediastinal contours. Visualized tracheal air column is within normal limits. Lung volumes are stable and within normal limits. Allowing for portable technique the lungs are clear. No pneumothorax or pleural effusion. No acute osseous abnormality identified. Negative visible bowel gas. IMPRESSION: Stable cardiomegaly. No acute cardiopulmonary abnormality. Electronically Signed   By: Odessa Fleming M.D.   On: 03/20/2023 04:52    Procedures .Critical Care E&M  Performed by: Cy Blamer, MD Critical care provider statement:    Critical care end time:  03/20/2023 6:16 AM   Critical care was necessary to treat or prevent imminent or life-threatening deterioration of the following conditions:  Cardiac failure and endocrine crisis   Critical care was time spent personally by me on the following activities:  Discussions with consultants, examination of patient, ordering and performing treatments and interventions, ordering and review of laboratory studies, ordering and review of radiographic studies, pulse oximetry, re-evaluation of patient's condition and review of old charts   Care discussed with: admitting provider   After initial E/M assessment, critical care services were subsequently performed that were exclusive of separately billable procedures or treatment.       Medications Ordered in ED Medications  diltiazem (CARDIZEM) 1 mg/mL load via infusion 10 mg (10 mg Intravenous Bolus from Bag 03/20/23 0449)    And  diltiazem (CARDIZEM) 125 mg in dextrose 5% 125 mL (1 mg/mL) infusion (10  mg/hr Intravenous Rate/Dose Change 03/20/23 0524)  heparin bolus via infusion 4,000 Units (4,000 Units Intravenous Bolus from Bag 03/20/23 0500)    Followed by  heparin ADULT infusion 100 units/mL (25000 units/265mL) (1,300 Units/hr Intravenous New Bag/Given 03/20/23 0501)  HYDROmorphone (DILAUDID) injection 1 mg (1 mg Intravenous Given 03/20/23 0458)    ED Course/ Medical Decision Making/ A&P                                 Medical Decision Making Patient with chest pain at rest with tachycardia  Amount and/or Complexity of Data Reviewed External Data Reviewed: labs and notes.    Details: Previous notes and albs reviewed Labs: ordered.    Details: First troponin mildly elevated 18, TSH < 0.010 (extreme hyperthyroid) normal sodium 137, normal potassium 3.7, normal creatinine.  Normal white count 5.5 hemoglobin slight low 12.6, normal platelets  Radiology: ordered and independent interpretation performed.    Details: Cardiomegaly by me  ECG/medicine tests: ordered and independent interpretation performed. Decision-making details documented in ED Course.  Risk Prescription drug management. Parenteral controlled substances. Decision regarding hospitalization.  Critical Care Total time providing critical care: 60 minutes  TSH undetectably low   Final Clinical Impression(s) / ED Diagnoses Final diagnoses:  Atrial fibrillation with RVR (HCC)  Non compliance w medication regimen  Hyperthyroidism    The patient appears reasonably stabilized for admission considering the current resources, flow, and capabilities available in the ED at this time, and I doubt any other [facility] requiring further screening and/or treatment in the ED prior to admission.  Rx / DC Orders ED Discharge Orders     None         Hokulani Rogel, MD 03/20/23 [phone]

## 2023-03-20 NOTE — Progress Notes (Signed)
PHARMACY - ANTICOAGULATION CONSULT NOTE  Pharmacy Consult for Heparin Indication: atrial fibrillation  No Known Allergies  Patient Measurements: Height: 6\' 4"  (193 cm) Weight: 93 kg (205 lb) IBW/kg (Calculated) : 86.8 Heparin Dosing Weight: TBW  Vital Signs: Temp: 98.3 F (36.8 C) (11/07 0733) Temp Source: Oral (11/07 0733) BP: 123/68 (11/07 0830) Pulse Rate: 82 (11/07 0830)  Labs: Recent Labs    03/20/23 0429 03/20/23 0458 03/20/23 0624  HGB 12.6*  --   --   HCT 39.5  --   --   PLT 318  --   --   APTT  --  30  --   LABPROT  --  14.8  --   INR  --  1.1  --   CREATININE 0.60*  --   --   TROPONINIHS 18*  --  15    Estimated Creatinine Clearance: 155.2 mL/min (A) (by C-G formula based on SCr of 0.6 mg/dL (L)).   Medical History: Past Medical History:  Diagnosis Date   Medical history non-contributory     Medications:  Infusions:   diltiazem (CARDIZEM) infusion 15 mg/hr (03/20/23 0559)   heparin 1,300 Units/hr (03/20/23 0501)     Assessment: 36 yoM presents to ED on 11/7 with c/o chest and back pain.  PMH includes HTN, hypothyroidism, NSTEMI, medication noncompliance.  Pharmacy is consulted to dose heparin for new Afib with RVR.    Today, 03/20/2023: Heparin level 0.17, subtherapeutic on heparin 1300 units/hr CBC:  Hgb decreased to 12.6, Plt WNL No bleeding or complications reported.   Goal of Therapy:  Heparin level 0.3-0.7 units/ml Monitor platelets by anticoagulation protocol: Yes   Plan:  Give heparin 3000 units bolus IV x 1 Increase to heparin IV infusion at 1600 units/hr Heparin level 6 hours after rate change Daily heparin level and CBC   Lynann Beaver PharmD, BCPS [facility] main pharmacy [phone] 03/20/2023 9:56 AM

## 2023-03-20 NOTE — ED Notes (Signed)
Pt is back in NSR.  EKG done

## 2023-03-20 NOTE — ED Notes (Signed)
ED TO INPATIENT HANDOFF REPORT  ED Nurse Name and Phone #: Gearlean Alf Name/Age/Gender Aaron [name] 37 y.o. male Room/Bed: WA24/WA24  Code Status   Code Status: Full Code  Home/SNF/Other Home Patient oriented to: self, place, time, and situation Is this baseline? Yes   Triage Complete: Triage complete  Chief Complaint Atrial fibrillation with rapid ventricular response (HCC) [I48.91]  Triage Note Pt reports upper chest and back pain since yesterday, always has since heart attack last year but got worse and intermittent shob. Also reports left leg and arm tingling.    Allergies No Known Allergies  Level of Care/Admitting Diagnosis ED Disposition     ED Disposition  Admit   Condition  --   Comment  Hospital Area: [facility] [[id number]]  Level of Care: Stepdown [14]  Admit to SDU based on following criteria: Cardiac Instability:  Patients experiencing chest pain, unconfirmed MI and stable, arrhythmias and CHF requiring medical management and potentially compromising patient's stability  [facility] to [facility] if equivalent level of care is available:: Yes  Covid Evaluation: Asymptomatic - no recent exposure (last 10 days) testing not required  Diagnosis: Atrial fibrillation with rapid ventricular response [facility]) [[id number]]  Admitting Physician: Maryln Gottron [[id number]]  Attending Physician: Olexa.Dam, MIR Jaxson.Roy [[id number]]  Certification:: I certify this patient will need inpatient services for at least 2 midnights  Expected Medical Readiness: 03/22/2023          B Medical/Surgery History Past Medical History:  Diagnosis Date   Medical history non-contributory    Past Surgical History:  Procedure Laterality Date   LEFT HEART CATH AND CORONARY ANGIOGRAPHY N/A 10/22/2021   Procedure: LEFT HEART CATH AND CORONARY ANGIOGRAPHY;  Surgeon: Yvonne Kendall, MD;  Location: [facility] INVASIVE CV LAB;  Service: Cardiovascular;  Laterality: N/A;    NO PAST SURGERIES       A IV Location/Drains/Wounds Patient Lines/Drains/Airways Status     Active Line/Drains/Airways     Name Placement date Placement time Site Days   Peripheral IV 03/20/23 18 G Right Antecubital 03/20/23  0425  Antecubital  less than 1   Peripheral IV 03/20/23 20 G Anterior;Distal;Left;Upper Arm 03/20/23  0503  Arm  less than 1   Peripheral IV 03/20/23 20 G Anterior;Distal;Left Forearm 03/20/23  1045  Forearm  less than 1            Intake/Output Last 24 hours No intake or output data in the 24 hours ending 03/20/23 1448  Labs/Imaging Results for orders placed or performed during the hospital encounter of 03/20/23 (from the past 48 hour(s))  Magnesium     Status: None   Collection Time: 03/20/23  4:27 AM  Result Value Ref Range   Magnesium 1.8 1.7 - 2.4 mg/dL    Comment: Performed at [facility], [address] [place], [place] [address]  CBC with Differential     Status: Abnormal   Collection Time: 03/20/23  4:29 AM  Result Value Ref Range   WBC 5.5 4.0 - 10.5 K/uL   RBC 5.31 4.22 - 5.81 MIL/uL   Hemoglobin 12.6 (L) 13.0 - 17.0 g/dL   HCT 47.4 25.9 - 56.3 %   MCV 74.4 (L) 80.0 - 100.0 fL   MCH 23.7 (L) 26.0 - 34.0 pg   MCHC 31.9 30.0 - 36.0 g/dL   RDW 87.5 64.3 - 32.9 %   Platelets 318 150 - 400 K/uL   nRBC 0.0 0.0 -  0.2 %   Neutrophils Relative % 44 %   Neutro Abs 2.4 1.7 - 7.7 K/uL   Lymphocytes Relative 33 %   Lymphs Abs 1.8 0.7 - 4.0 K/uL   Monocytes Relative 21 %   Monocytes Absolute 1.2 (H) 0.1 - 1.0 K/uL   Eosinophils Relative 1 %   Eosinophils Absolute 0.1 0.0 - 0.5 K/uL   Basophils Relative 1 %   Basophils Absolute 0.0 0.0 - 0.1 K/uL   Immature Granulocytes 0 %   Abs Immature Granulocytes 0.01 0.00 - 0.07 K/uL    Comment: Performed at [facility], [address], [place], [place] [address]  Basic metabolic panel     Status: Abnormal   Collection Time: 03/20/23  4:29 AM  Result Value  Ref Range   Sodium 137 135 - 145 mmol/L   Potassium 3.7 3.5 - 5.1 mmol/L   Chloride 103 98 - 111 mmol/L   CO2 23 22 - 32 mmol/L   Glucose, Bld 128 (H) 70 - 99 mg/dL    Comment: Glucose reference range applies only to samples taken after fasting for at least 8 hours.   BUN 13 6 - 20 mg/dL   Creatinine, Ser 0.98 (L) 0.61 - 1.24 mg/dL   Calcium 9.1 8.9 - 11.9 mg/dL   GFR, Estimated >14 >78 mL/min    Comment: (NOTE) Calculated using the CKD-EPI Creatinine Equation (2021)    Anion gap 11 5 - 15    Comment: Performed at [facility], [address] [place], [place] [address]  TSH     Status: Abnormal   Collection Time: 03/20/23  4:29 AM  Result Value Ref Range   TSH <0.010 (L) 0.350 - 4.500 uIU/mL    Comment: Performed by a 3rd Generation assay with a functional sensitivity of <=0.01 uIU/mL. Performed at [facility], [address], [place], [place] [address]   Troponin I (High Sensitivity)     Status: Abnormal   Collection Time: 03/20/23  4:29 AM  Result Value Ref Range   Troponin I (High Sensitivity) 18 (H) <18 ng/L    Comment: (NOTE) Elevated high sensitivity troponin I (hsTnI) values and significant  changes across serial measurements may suggest ACS but many other  chronic and acute conditions are known to elevate hsTnI results.  Refer to the "Links" section for chest pain algorithms and additional  guidance. Performed at [facility], [address], [place], [place] [address]   APTT     Status: None   Collection Time: 03/20/23  4:58 AM  Result Value Ref Range   aPTT 30 24 - 36 seconds    Comment: Performed at [facility], [address], [place], [place] [address]  Protime-INR     Status: None   Collection Time: 03/20/23  4:58 AM  Result Value Ref Range   Prothrombin Time 14.8 11.4 - 15.2 seconds   INR 1.1 0.8 - 1.2    Comment: (NOTE) INR goal varies based on device and disease  states. Performed at [facility], [address], [place], [place] [address]   Troponin I (High Sensitivity)     Status: None   Collection Time: 03/20/23  6:24 AM  Result Value Ref Range   Troponin I (High Sensitivity) 15 <18 ng/L    Comment: (NOTE) Elevated high sensitivity troponin I (hsTnI) values and significant  changes across serial measurements may suggest ACS but many other  chronic and  acute conditions are known to elevate hsTnI results.  Refer to the "Links" section for chest pain algorithms and additional  guidance. Performed at [facility], [address] [place], [place] [address]   T4, free     Status: Abnormal   Collection Time: 03/20/23 10:41 AM  Result Value Ref Range   Free T4 4.55 (H) 0.61 - 1.12 ng/dL    Comment: (NOTE) Biotin ingestion may interfere with free T4 tests. If the results are inconsistent with the TSH level, previous test results, or the clinical presentation, then consider biotin interference. If needed, order repeat testing after stopping biotin. Performed at [facility], [address] [place], [place] [address]   Heparin level (unfractionated)     Status: Abnormal   Collection Time: 03/20/23 11:12 AM  Result Value Ref Range   Heparin Unfractionated 0.17 (L) 0.30 - 0.70 IU/mL    Comment: (NOTE) The clinical reportable range upper limit is being lowered to >1.10 to align with the FDA approved guidance for the current laboratory assay.  If heparin results are below expected values, and patient dosage has  been confirmed, suggest follow up testing of antithrombin III levels. Performed at [facility], [address], [place], [place] [address]    DG Chest Portable 1 View  Result Date: 03/20/2023 CLINICAL DATA:  37 year old male with chest and upper back pain for 24 hours. Extremity tingling. History of MI. EXAM: PORTABLE CHEST 1 VIEW COMPARISON:  CTA chest 03/30/2022 and  earlier. FINDINGS: Portable AP semi upright view at 0431 hours. Stable cardiomegaly and mediastinal contours. Visualized tracheal air column is within normal limits. Lung volumes are stable and within normal limits. Allowing for portable technique the lungs are clear. No pneumothorax or pleural effusion. No acute osseous abnormality identified. Negative visible bowel gas. IMPRESSION: Stable cardiomegaly. No acute cardiopulmonary abnormality. Electronically Signed   By: Odessa Fleming M.D.   On: 03/20/2023 04:52    Pending Labs Unresulted Labs (From admission, onward)     Start     Ordered   03/21/23 0500  Basic metabolic panel  Tomorrow morning,   R        03/20/23 0926   03/21/23 0500  CBC  Daily,   R      03/20/23 1344   03/20/23 1830  Heparin level (unfractionated)  Once-Timed,   TIMED        03/20/23 1344   03/20/23 0925  T3, free  Once,   R        03/20/23 0926   03/20/23 0922  HIV Antibody (routine testing w rflx)  (HIV Antibody (Routine testing w reflex) panel)  Once,   R        03/20/23 0926            Vitals/Pain Today's Vitals   03/20/23 1118 03/20/23 1345 03/20/23 1418 03/20/23 1445  BP:  113/70 125/74 120/64  Pulse:  76 72 75  Resp:  (!) 25 (!) 22 (!) 27  Temp:   (!) 97.5 F (36.4 C)   TempSrc:   Oral   SpO2:  95% 100% 99%  Weight:      Height:      PainSc: 0-No pain  0-No pain     Isolation Precautions No active isolations  Medications Medications  diltiazem (CARDIZEM) 1 mg/mL load via infusion 10 mg (10 mg Intravenous Bolus from Bag 03/20/23 0449)    And  diltiazem (CARDIZEM) 125 mg in dextrose 5%  125 mL (1 mg/mL) infusion (15 mg/hr Intravenous New Bag/Given 03/20/23 1244)  metoprolol tartrate (LOPRESSOR) tablet 25 mg (25 mg Oral Given 03/20/23 1033)  methimazole (TAPAZOLE) tablet 10 mg (10 mg Oral Given 03/20/23 1418)  acetaminophen (TYLENOL) tablet 650 mg (has no administration in time range)    Or  acetaminophen (TYLENOL) suppository 650 mg (has no  administration in time range)  traZODone (DESYREL) tablet 25 mg (has no administration in time range)  ondansetron (ZOFRAN) tablet 4 mg (has no administration in time range)    Or  ondansetron (ZOFRAN) injection 4 mg (has no administration in time range)  albuterol (PROVENTIL) (2.5 MG/3ML) 0.083% nebulizer solution 2.5 mg (has no administration in time range)  heparin ADULT infusion 100 units/mL (25000 units/268mL) (1,600 Units/hr Intravenous Rate/Dose Change 03/20/23 1236)  HYDROmorphone (DILAUDID) injection 1 mg (1 mg Intravenous Given 03/20/23 0458)  heparin bolus via infusion 4,000 Units (4,000 Units Intravenous Bolus from Bag 03/20/23 0500)  methimazole (TAPAZOLE) tablet 20 mg (20 mg Oral Given 03/20/23 0615)  potassium chloride SA (KLOR-CON M) CR tablet 40 mEq (40 mEq Oral Given 03/20/23 1033)  heparin bolus via infusion 3,000 Units (3,000 Units Intravenous Bolus from Bag 03/20/23 1234)    Mobility walks     Focused Assessments Cardiac Assessment Handoff:  Cardiac Rhythm: Atrial fibrillation No results found for: "CKTOTAL", "CKMB", "CKMBINDEX", "TROPONINI" Lab Results  Component Value Date   DDIMER 0.32 10/19/2021   Does the Patient currently have chest pain? No    R Recommendations: See Admitting Provider Note  Report given to:   Additional Notes:

## 2023-03-20 NOTE — ED Notes (Signed)
Assumed care of patient. Patient resting comfortably in bed with no signs of acute distress noted. Waiting on hospital admission.

## 2023-03-21 LAB — T3, FREE: T3, Free: 20.3 pg/mL — ABNORMAL HIGH (ref 2.0–4.4)

## 2023-03-21 LAB — BASIC METABOLIC PANEL
Anion gap: 11 (ref 5–15)
BUN: 17 mg/dL (ref 6–20)
CO2: 19 mmol/L — ABNORMAL LOW (ref 22–32)
Calcium: 9.2 mg/dL (ref 8.9–10.3)
Chloride: 106 mmol/L (ref 98–111)
Creatinine, Ser: 0.61 mg/dL (ref 0.61–1.24)
GFR, Estimated: >60 mL/min (ref >60)
Glucose, Bld: 96 mg/dL (ref 70–99)
Potassium: 4.1 mmol/L (ref 3.5–5.1)
Sodium: 136 mmol/L (ref 135–145)

## 2023-03-21 LAB — CBC
HCT: 38.2 % — ABNORMAL LOW (ref 39.0–52.0)
Hemoglobin: 11.7 g/dL — ABNORMAL LOW (ref 13.0–17.0)
MCH: 23.8 pg — ABNORMAL LOW (ref 26.0–34.0)
MCHC: 30.6 g/dL (ref 30.0–36.0)
MCV: 77.6 fL — ABNORMAL LOW (ref 80.0–100.0)
Platelets: 261 10*3/uL (ref 150–400)
RBC: 4.92 MIL/uL (ref 4.22–5.81)
RDW: 14.6 % (ref 11.5–15.5)
WBC: 5.2 10*3/uL (ref 4.0–10.5)
nRBC: 0 % (ref 0.0–0.2)

## 2023-03-21 LAB — HEPARIN LEVEL (UNFRACTIONATED): Heparin Unfractionated: 0.36 [IU]/mL (ref 0.30–0.70)

## 2023-03-21 LAB — MAGNESIUM: Magnesium: 1.7 mg/dL (ref 1.7–2.4)

## 2023-03-21 MED ORDER — METOPROLOL TARTRATE 25 MG PO TABS
12.5000 mg | ORAL_TABLET | Freq: Once | ORAL | Status: AC
  Administered 2023-03-21: 12.5 mg via ORAL
  Filled 2023-03-21: qty 1

## 2023-03-21 NOTE — Progress Notes (Signed)
PHARMACY - ANTICOAGULATION CONSULT NOTE  Pharmacy Consult for heparin Indication: new onset atrial fibrillation  No Known Allergies  Patient Measurements: Height: 6\' 4"  (193 cm) Weight: 89.2 kg (196 lb 9.6 oz) IBW/kg (Calculated) : 86.8 Heparin Dosing Weight: 89.2 kg  Vital Signs: Temp: 98.4 F (36.9 C) (11/08 0646) Temp Source: Oral (11/08 0646) BP: 144/62 (11/08 0646) Pulse Rate: 76 (11/08 0646)  Labs: Recent Labs    03/20/23 0429 03/20/23 0458 03/20/23 0624 03/20/23 1112 03/20/23 1845 03/21/23 0530  HGB 12.6*  --   --   --   --  11.7*  HCT 39.5  --   --   --   --  38.2*  PLT 318  --   --   --   --  261  APTT  --  30  --   --   --   --   LABPROT  --  14.8  --   --   --   --   INR  --  1.1  --   --   --   --   HEPARINUNFRC  --   --   --  0.17* 0.28* 0.36  CREATININE 0.60*  --   --   --   --  0.61  TROPONINIHS 18*  --  15  --   --   --     Estimated Creatinine Clearance: 155.2 mL/min (by C-G formula based on SCr of 0.61 mg/dL).   Medical History: Past Medical History:  Diagnosis Date   Medical history non-contributory     Assessment: Patient is a 37 YO male who presented to the ED on 11/7 with chest and back pain. PMH includes HTN, hyperthyroidism, and NSTEMI. He has had several previous admissions due to tachycardia and is noncompliant with any of his medications. He was not on any anticoagulants PTA. ECG on 11/7 found new onset atrial fibrillation with RVR. Pharmacy has been consulted for heparin dosing.   03/21/2023  - heparin level therapeutic at 0.36 on 1750 units/hr - Hgb trending down 11.7, plts 261  - No bleeding reported   Goal of Therapy:  Heparin level 0.3-0.7 units/ml Monitor platelets by anticoagulation protocol: Yes   Plan:  - Continue IV heparin at 1750 units/hr  - Daily HL and CBC while on heparin  - Monitor for s/sx of bleeding    Karlton Lemon, PharmD Candidate  03/21/2023,7:26 AM

## 2023-03-21 NOTE — Plan of Care (Signed)
  Problem: Education: Goal: Knowledge of General Education information will improve Description: Including pain rating scale, medication(s)/side effects and non-pharmacologic comfort measures Outcome: Progressing   Problem: Health Behavior/Discharge Planning: Goal: Ability to manage health-related needs will improve Outcome: Progressing   Problem: Nutrition: Goal: Adequate nutrition will be maintained Outcome: Progressing   Problem: Pain Management: Goal: General experience of comfort will improve Outcome: Progressing

## 2023-03-21 NOTE — Progress Notes (Signed)
HOSPITALIST ROUNDING NOTE Aaron [name]:[id number]  DOB: October 23, 1985  DOA: 03/20/2023  PCP: Patient, No Pcp Per  03/21/2023,7:08 AM   LOS: 1 day      Code Status: full  From:  home    Current Dispo: home     36 blk male hyperthryoid 2/2  NSTEMI 10/2021 2/2 probable demand ischemia secondary to untreated hyperthyroidism-thyroid ultrasound 10/30/2021 enlarged thyroid and small thyroid nodules was referred to endocrinology on that admission by cardiology and was started on methimazole 10 3 times daily at that admission Documented noncompliance with the same with recurrent admission for the same 11/18-11/20 2023 Known THC  Presented with upper chest back pain 03/19/2022 shortness of breath Came to ER for evaluation found to be in rapid A-fib started on heparin Cardizem gtt. methimazole resumed  11/7 echocardiogram EF 50% low normal function LVH noted      Plan  New afib RVR Chadvasc ~1 Converted overnight with Cardizem gtt.--- I have discontinued Cardizem as well as anticoagulation Continue metoprolol 25 twice daily discussed with Dr. Wyline Mood of cardiology no need for anticoagulation no need for workup of low normal EF at this time but may need outpatient echo in 3 to 4 months Driver for A-fib was uncontrolled hyperthyroid state and I have had a clear and detailed discussion with him and his wife/girlfriend at the bedside of need for compliance  Hyperthyroid diag 10/2021 TPO/TSI +--- working diagnosis of Graves' disease given TSI >14.8, TPO >600 and thyrotropin receptor antibodies >25 back in 10/2021 Currently TSH is <0.010 with T44.5 And T3 20.3 Will need outpatient endocrinology referral per PCP He does not have a primary physician He will continue methimazole 10 3 times daily and have recommended he take this at all times We will suppress his tachycardia as above   Non compliance Reinforced as above   DVT prophylaxis: SCD  Status is: Inpatient Remains inpatient appropriate because:    Monitor for arrhythmia      Subjective: Awake coherent no distress looks well feels well converted to sinus overnight No chest pain currently  Objective + exam Vitals:   03/20/23 2200 03/20/23 2300 03/21/23 0336 03/21/23 0646  BP: (!) 141/73 135/77 (!) 139/93 (!) 144/62  Pulse: 80  86 76  Resp: (!) 33 20 16   Temp:  98.5 F (36.9 C) 98.9 F (37.2 C) 98.4 F (36.9 C)  TempSrc:  Oral Oral Oral  SpO2: 97%  100% 100%  Weight:  89.2 kg    Height:  6\' 4"  (1.93 m)     Filed Weights   03/20/23 0421 03/20/23 2300  Weight: 93 kg 89.2 kg    Examination:  EOMI NCAT do not appreciate any specific thyroid swelling Neck soft supple Chest clear no wheeze rales rhonchi Sinus Abdomen soft no rebound No lower extremity edema Multiple tattoos Euthymic coherent  Data Reviewed: reviewed   CBC    Component Value Date/Time   WBC 5.2 03/21/2023 0530   RBC 4.92 03/21/2023 0530   HGB 11.7 (L) 03/21/2023 0530   HCT 38.2 (L) 03/21/2023 0530   PLT 261 03/21/2023 0530   MCV 77.6 (L) 03/21/2023 0530   MCH 23.8 (L) 03/21/2023 0530   MCHC 30.6 03/21/2023 0530   RDW 14.6 03/21/2023 0530   LYMPHSABS 1.8 03/20/2023 0429   MONOABS 1.2 (H) 03/20/2023 0429   EOSABS 0.1 03/20/2023 0429   BASOSABS 0.0 03/20/2023 0429      Latest Ref Rng & Units 03/21/2023    5:30 AM  03/20/2023    4:29 AM 03/30/2022   12:55 AM  CMP  Glucose 70 - 99 mg/dL 96  782  99   BUN 6 - 20 mg/dL 17  13  14    Creatinine 0.61 - 1.24 mg/dL 9.56  2.13  0.86   Sodium 135 - 145 mmol/L 136  137  140   Potassium 3.5 - 5.1 mmol/L 4.1  3.7  3.9   Chloride 98 - 111 mmol/L 106  103  109   CO2 22 - 32 mmol/L 19  23  25    Calcium 8.9 - 10.3 mg/dL 9.2  9.1  9.0   Total Protein 6.5 - 8.1 g/dL   7.5   Total Bilirubin 0.3 - 1.2 mg/dL   0.9   Alkaline Phos 38 - 126 U/L   112   AST 15 - 41 U/L   24   ALT 0 - 44 U/L   29      Scheduled Meds:  Chlorhexidine Gluconate Cloth  6 each Topical Daily   methimazole  10 mg Oral Q8H    metoprolol tartrate  25 mg Oral BID   Continuous Infusions:  diltiazem (CARDIZEM) infusion 10 mg/hr (03/20/23 2114)   heparin 1,750 Units/hr (03/20/23 2005)    Time  36  Rhetta Mura, MD  [facility]

## 2023-03-21 NOTE — TOC Initial Note (Signed)
Transition of Care [facility]) - Initial/Assessment Note    Patient Details  Name: Aaron [name] MRN: [id number] Date of Birth: 12/05/1985  Transition [facility] Care [facility]) CM/SW Contact:    Lanier Clam, RN Phone Number: 03/21/2023, 2:28 PM  Clinical Narrative:  Provided patient w/PCP listing clinics. Added health dept to AVS, resources for substance use, Dept social services.                 Expected Discharge Plan: Home/Self Care Barriers to Discharge: Continued Medical Work up   Patient Goals and CMS Choice Patient states their goals for this hospitalization and ongoing recovery are:: Home CMS Medicare.gov Compare Post Acute Care list provided to:: Patient Choice offered to / list presented to : Patient [place] ownership interest in [facility]provided to:: Patient    Expected Discharge Plan and Services   Discharge Planning Services: CM Consult   Living arrangements for the past 2 months: Single Family Home                                      Prior Living Arrangements/Services Living arrangements for the past 2 months: Single Family Home Lives with:: Friends Patient language and need for interpreter reviewed:: Yes Do you feel safe going back to the place where you live?: Yes      Need for Family Participation in Patient Care: Yes (Comment) Care giver support system in place?: Yes (comment)   Criminal Activity/Legal Involvement Pertinent to Current Situation/Hospitalization: No - Comment as needed  Activities of Daily Living      Permission Sought/Granted Permission sought to share information with : Case Manager Permission granted to share information with : Yes, Verbal Permission Granted  Share Information with NAME: Case manager           Emotional Assessment Appearance:: Appears stated age Attitude/Demeanor/Rapport: Gracious Affect (typically observed): Accepting Orientation: : Oriented to Self, Oriented to Place, Oriented to  Time, Oriented  to Situation Alcohol / Substance Use: Illicit Drugs Psych Involvement: No (comment)  Admission diagnosis:  Hyperthyroidism [E05.90] Atrial fibrillation with rapid ventricular response (HCC) [I48.91] Atrial fibrillation with RVR (HCC) [I48.91] Non compliance w medication regimen [Z91.148] Patient Active Problem List   Diagnosis Date Noted   Atrial fibrillation with rapid ventricular response (HCC) 03/20/2023   Chest pain 03/30/2022   Essential hypertension 03/30/2022   Hypomagnesemia 03/30/2022   Hyperthyroidism 10/23/2021   Elevated troponin 10/19/2021   NSTEMI (non-ST elevated myocardial infarction) (HCC) 10/19/2021   PCP:  Patient, No Pcp Per Pharmacy:   [id number] [place], [place] - [address] [place] [address] Phone: [phone] Fax: [phone]  [facility] [address] [place] [address] Phone: [phone] Fax: [phone]  [facility] [address] [place] [address] Phone: [phone] Fax: [phone]     Social Determinants of Health (SDOH) Social History: SDOH Screenings   Food Insecurity: No Food Insecurity (03/30/2022)  Housing: Low Risk  (03/30/2022)  Transportation Needs: No Transportation Needs (03/30/2022)  Utilities: Not At Risk (03/30/2022)  Tobacco Use: Low Risk  (03/20/2023)   SDOH Interventions:     Readmission Risk Interventions     No data to display

## 2023-03-21 NOTE — Discharge Instructions (Signed)
  [facility] 2.5 12 Google reviews   Medical clinic in [place], [facility] Directions Share Call  Address: [address] [place], [place], [place] [address] Hours:  Closed ? Opens 8?AM Mon Phone: [phone]

## 2023-03-22 ENCOUNTER — Encounter: Payer: Self-pay | Admitting: Family Medicine

## 2023-03-22 LAB — COMPREHENSIVE METABOLIC PANEL
ALT: 27 U/L (ref 0–44)
AST: 27 U/L (ref 15–41)
Albumin: 3.8 g/dL (ref 3.5–5.0)
Alkaline Phosphatase: 96 U/L (ref 38–126)
Anion gap: 9 (ref 5–15)
BUN: 13 mg/dL (ref 6–20)
CO2: 24 mmol/L (ref 22–32)
Calcium: 9.3 mg/dL (ref 8.9–10.3)
Chloride: 103 mmol/L (ref 98–111)
Creatinine, Ser: 0.71 mg/dL (ref 0.61–1.24)
GFR, Estimated: >60 mL/min (ref >60)
Glucose, Bld: 94 mg/dL (ref 70–99)
Potassium: 3.9 mmol/L (ref 3.5–5.1)
Sodium: 136 mmol/L (ref 135–145)
Total Bilirubin: 0.9 mg/dL (ref <1.2)
Total Protein: 7.4 g/dL (ref 6.5–8.1)

## 2023-03-22 MED ORDER — METOPROLOL TARTRATE 37.5 MG PO TABS: 37.5000 mg | ORAL_TABLET | Freq: Two times a day (BID) | ORAL | 2 refills | Status: DC

## 2023-03-22 MED ORDER — METHIMAZOLE 10 MG PO TABS: 10.0000 mg | ORAL_TABLET | Freq: Three times a day (TID) | ORAL | 2 refills | Status: DC

## 2023-03-22 NOTE — TOC Transition Note (Signed)
Transition of Care [facility]) - CM/SW Discharge Note   Patient Details  Name: ISAACK PERSINGER MRN: [id number] Date of Birth: [birth year]  Transition [facility] Care [facility]) CM/SW Contact:  Adrian Prows, RN Phone Number: 03/22/2023, 10:16 AM   Clinical Narrative:    D/C orders received; no TOC needs.   Final next level of care: Home/Self Care Barriers to Discharge: No Barriers Identified   Patient Goals and CMS Choice CMS Medicare.gov Compare Post Acute Care list provided to:: Patient Choice offered to / list presented to : Patient  Discharge Placement                         Discharge Plan and Services Additional resources added to the After Visit Summary for     Discharge Planning Services: CM Consult                                 Social Determinants of Health (SDOH) Interventions SDOH Screenings   Food Insecurity: No Food Insecurity (03/21/2023)  Housing: Low Risk  (03/21/2023)  Transportation Needs: No Transportation Needs (03/21/2023)  Utilities: Not At Risk (03/21/2023)  Tobacco Use: Low Risk  (03/20/2023)     Readmission Risk Interventions     No data to display

## 2023-03-22 NOTE — Discharge Summary (Signed)
Physician Discharge Summary  Aaron [name] WGN:[id number] DOB: 06/10/1985 DOA: 03/20/2023  PCP: Patient, No Pcp Per  Admit date: 03/20/2023 Discharge date: 03/22/2023  Time spent: 35 minutes  Recommendations for Outpatient Follow-up:  Needs outpatient referral to PCP and endocrinology-patient has been given number to establish care with a PCP and needs to be seen within 1 to 2 weeks Recommend 3-week TSH T4 T3 and continue dosing of methimazole Recommend outpatient coordination with cardiology  Discharge Diagnoses:  MAIN problem for hospitalization   Paroxysmal A-fib Uncontrolled hyperthyroid state secondary to probable Graves' disease Severe mitral valve regurgitation  Please see below for itemized issues addressed in HOpsital- refer to other progress notes for clarity if needed  Discharge Condition: Improved  Diet recommendation: Regular  Filed Weights   03/20/23 0421 03/20/23 2300  Weight: 93 kg 89.2 kg    History of present illness:  62 blk male hyperthryoid 2/2  NSTEMI 10/2021 2/2 probable demand ischemia secondary to untreated hyperthyroidism-thyroid ultrasound 10/30/2021 enlarged thyroid and small thyroid nodules was referred to endocrinology on that admission by cardiology and was started on methimazole 10 3 times daily at that admission Documented noncompliance with the same with recurrent admission for the same 11/18-11/20 2023 Known THC   Presented with upper chest back pain 03/19/2022 shortness of breath Came to ER for evaluation found to be in rapid A-fib started on heparin Cardizem gtt. 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 which was stopped quickly during hospital stay, he was supposed to been on  metoprolol 50 twice daily which he is noncompliant with He had some tachyarrhythmias so we increased his 25 twice daily metoprolol to 37.5 He does not need anticoagulation after discussion with Dr. Wyline Mood cardiology but does need close follow-up for below issues  Uncontrolled hyperthyroidism likely secondary to Graves' disease with presence of positive TPO, TSI, thyrotropin receptor antibodies in 10/2021 when he was last admitted Here his TSH T4 and T3 were altered, TSH less than 0.01 T4 44 and T3 20 He will need to follow-up with primary care--whoever can see him first can repeat his TFTs in 3 to 4 weeks to ensure that he is on the right track I have given him adequate supply of methimazole 10 3 times daily to suppress this He will continue on beta-blockade as above  Severe mitral valve regurgitation This was seen and documented on prior echo 03/2022 admission-this appears to have been a progression from his 10/2021 at admission when Dr. Raynelle Jan saw him I have stressed the importance of the patient following up with both PCP and have told him that I have reached out to cardiology to set up a follow-up appointment He will need to be compliant on his meds and that was stressed to him as well     Discharge Exam: Vitals:   03/21/23 2020 03/22/23 0524  BP: (!) 150/83 (!) 151/70  Pulse: 89 84  Resp: (!) 9   Temp: 99.2 F (37.3 C) 98.6 F (37 C)  SpO2: 100% 100%    Subj on day of d/c   Awake coherent alert no distress  General Exam on discharge  EOMI NCAT no focal deficit no icterus Chest is clear no added sound He has a holosystolic murmur left lower sternal edge which  is grade 4/6--- accentuated with position changes turning to the left side Abdomen is soft no rebound ROM is intact  Discharge Instructions   Discharge Instructions     Diet - low sodium heart healthy   Complete by: As directed    Discharge instructions   Complete by: As directed    Requires rpt TFT in  OP T4, TSH, T3 and discussion with endocrinology about further management of Graves' disease-May benefit from consideration of repeat thyroid ultrasound Would recommend continued beta-blocker until seen by primary care/endocrinologist and may be can taper off the same if heart rate is controlled Recommend cessation of cannabis if possible Also recommend repeat echocardiogram in the outpatient setting given presence of murmur at time of discharge holosystolic best heard LLSE   Increase activity slowly   Complete by: As directed       Allergies as of 03/22/2023   No Known Allergies      Medication List     TAKE these medications    methimazole 10 MG tablet Commonly known as: TAPAZOLE Take 1 tablet (10 mg total) by mouth every 8 (eight) hours.   Metoprolol Tartrate 37.5 MG Tabs Take 1 tablet (37.5 mg total) by mouth 2 (two) times daily. What changed:  medication strength how much to take       No Known Allergies    The results of significant diagnostics from this hospitalization (including imaging, microbiology, ancillary and laboratory) are listed below for reference.    Significant Diagnostic Studies: ECHOCARDIOGRAM COMPLETE  Result Date: 03/20/2023    ECHOCARDIOGRAM REPORT   Patient Name:   CHEVALIER KARNITZ Date of Exam: 03/20/2023 Medical Rec #:  [id number]    Height:       76.0 in Accession #:    [id number]   Weight:       205.0 lb Date of Birth:  05-16-1985    BSA:          2.239 m Patient Age:    36 years     BP:           141/85 mmHg Patient Gender: M            HR:           72 bpm. Exam Location:  Inpatient Procedure: 2D Echo, Cardiac Doppler and Color Doppler Indications:    Atrial Fibrillation  History:        Patient has prior history of Echocardiogram examinations, most                 recent 04/01/2022. Arrythmias:Atrial Flutter; Risk                 Factors:Hypertension.  Sonographer:    Karma Ganja Referring Phys: [id number] MIR M [facility] IMPRESSIONS  1. Left  ventricular ejection fraction, by estimation, is 50%. The left ventricle has low normal function. The left ventricle has no regional wall motion abnormalities. There is mild left ventricular hypertrophy. Left ventricular diastolic parameters are  indeterminate.  2. Right ventricular systolic function is mildly reduced. The right ventricular size is normal. There is mildly elevated pulmonary artery systolic pressure. The estimated right ventricular systolic pressure is 37.4 mmHg.  3. Left atrial size was severely dilated.  4. Right atrial size was moderately dilated.  5. Bileaflet mitral valve prolapse with posterior > anterior prolapse and severe, eccentric anteriorly directed jet of MR. The mitral valve is myxomatous. Severe mitral valve regurgitation. No evidence of mitral stenosis.  6. The aortic  valve is grossly normal. Aortic valve regurgitation is not visualized. No aortic stenosis is present.  7. Mildly dilated pulmonary artery.  8. The inferior vena cava is dilated in size with >50% respiratory variability, suggesting right atrial pressure of 8 mmHg. FINDINGS  Left Ventricle: Left ventricular ejection fraction, by estimation, is 50%. The left ventricle has low normal function. The left ventricle has no regional wall motion abnormalities. The left ventricular internal cavity size was normal in size. There is mild left ventricular hypertrophy. Left ventricular diastolic parameters are indeterminate. Right Ventricle: The right ventricular size is normal. No increase in right ventricular wall thickness. Right ventricular systolic function is mildly reduced. There is mildly elevated pulmonary artery systolic pressure. The tricuspid regurgitant velocity  is 2.71 m/s, and with an assumed right atrial pressure of 8 mmHg, the estimated right ventricular systolic pressure is 37.4 mmHg. Left Atrium: Left atrial size was severely dilated. Right Atrium: Right atrial size was moderately dilated. Pericardium: There is no  evidence of pericardial effusion. 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 The mitral valve is myxomatous. Severe mitral valve regurgitation. No evidence of mitral valve stenosis. MV peak gradient, 78.5 mmHg. The mean mitral valve gradient is 37.0 mmHg. Tricuspid Valve: The tricuspid valve is normal in structure. Tricuspid valve regurgitation is mild . No evidence of tricuspid stenosis. Aortic Valve: The aortic valve is grossly normal. Aortic valve regurgitation is not visualized. No aortic stenosis is present. Aortic valve mean gradient measures 4.0 mmHg. Aortic valve peak gradient measures 6.5 mmHg. Aortic valve area, by VTI measures 2.17 cm. Pulmonic Valve: The pulmonic valve was normal in structure. Pulmonic valve regurgitation is trivial. No evidence of pulmonic stenosis. Aorta: The aortic root is normal in size and structure. Pulmonary Artery: The pulmonary artery is mildly dilated. Venous: The inferior vena cava is dilated in size with greater than 50% respiratory variability, suggesting right atrial pressure of 8 mmHg. IAS/Shunts: No atrial level shunt detected by color flow Doppler.  LEFT VENTRICLE PLAX 2D LVIDd:         5.40 cm   Diastology LVIDs:         3.80 cm   LV e' medial:    9.00 cm/s LV PW:         1.20 cm   LV E/e' medial:  17.9 LV IVS:        1.30 cm   LV e' lateral:   15.77 cm/s LVOT diam:     2.00 cm   LV E/e' lateral: 10.2 LV SV:         40 LV SV Index:   18 LVOT Area:     3.14 cm  RIGHT VENTRICLE             IVC RV Basal diam:  4.50 cm     IVC diam: 2.50 cm RV S prime:     12.07 cm/s TAPSE (M-mode): 2.4 cm LEFT ATRIUM              Index         RIGHT ATRIUM           Index LA diam:        4.40 cm  1.97 cm/m    RA Area:     27.00 cm LA Vol (A2C):   285.0 ml 127.31 ml/m  RA Volume:   91.40 ml  40.83 ml/m LA Vol (A4C):   115.0 ml  51.37 ml/m LA Biplane Vol: 183.0 ml 81.75 ml/m  AORTIC VALVE AV  Area (Vmax):    2.30 cm AV Area (Vmean):   2.09 cm AV Area (VTI):     2.17 cm AV Vmax:           127.00 cm/s AV Vmean:          94.400 cm/s AV VTI:            0.186 m AV Peak Grad:      6.5 mmHg AV Mean Grad:      4.0 mmHg LVOT Vmax:         92.93 cm/s LVOT Vmean:        62.900 cm/s LVOT VTI:          0.129 m LVOT/AV VTI ratio: 0.69  AORTA Ao Root diam: 3.00 cm Ao Asc diam:  2.80 cm MITRAL VALVE                TRICUSPID VALVE MV Area (PHT): 4.33 cm     TR Peak grad:   29.4 mmHg MV Area VTI:   0.49 cm     TR Vmax:        271.00 cm/s MV Peak grad:  78.5 mmHg MV Mean grad:  37.0 mmHg    SHUNTS MV Vmax:       4.43 m/s     Systemic VTI:  0.13 m MV Vmean:      272.0 cm/s   Systemic Diam: 2.00 cm MV Decel Time: 175 msec MV E velocity: 161.00 cm/s Weston Brass MD Electronically signed by Weston Brass MD Signature Date/Time: 03/20/2023/10:41:56 PM    Final    DG Chest Portable 1 View  Result Date: 03/20/2023 CLINICAL DATA:  37 year old male with chest and upper back pain for 24 hours. Extremity tingling. History of MI. EXAM: PORTABLE CHEST 1 VIEW COMPARISON:  CTA chest 03/30/2022 and earlier. FINDINGS: Portable AP semi upright view at 0431 hours. Stable cardiomegaly and mediastinal contours. Visualized tracheal air column is within normal limits. Lung volumes are stable and within normal limits. Allowing for portable technique the lungs are clear. No pneumothorax or pleural effusion. No acute osseous abnormality identified. Negative visible bowel gas. IMPRESSION: Stable cardiomegaly. No acute cardiopulmonary abnormality. Electronically Signed   By: Odessa Fleming M.D.   On: 03/20/2023 04:52    Microbiology: Recent Results (from the past 240 hour(s))  MRSA Next Gen by PCR, Nasal     Status: None   Collection Time: 03/20/23  5:20 PM   Specimen: Nasal Mucosa; Nasal Swab  Result Value Ref Range Status   MRSA by PCR Next Gen NOT DETECTED NOT DETECTED Final    Comment: (NOTE) The GeneXpert MRSA Assay (FDA approved  for NASAL specimens only), is one component of a comprehensive MRSA colonization surveillance program. It is not intended to diagnose MRSA infection nor to guide or monitor treatment for MRSA infections. Test performance is not FDA approved in patients less than 30 years old. Performed at [facility], [address], [place], [place] [address]      Labs: Basic Metabolic Panel: Recent Labs  Lab 03/20/23 0427 03/20/23 0429 03/21/23 0530 03/21/23 1140 03/22/23 0603  NA  --  137 136  --  136  K  --  3.7 4.1  --  3.9  CL  --  103 106  --  103  CO2  --  23 19*  --  24  GLUCOSE  --  128* 96  --  94  BUN  --  13 17  --  13  CREATININE  --  0.60* 0.61  --  0.71  CALCIUM  --  9.1 9.2  --  9.3  MG 1.8  --   --  1.7  --    Liver Function Tests: Recent Labs  Lab 03/22/23 0603  AST 27  ALT 27  ALKPHOS 96  BILITOT 0.9  PROT 7.4  ALBUMIN 3.8   No results for input(s): "LIPASE", "AMYLASE" in the last 168 hours. No results for input(s): "AMMONIA" in the last 168 hours. CBC: Recent Labs  Lab 03/20/23 0429 03/21/23 0530  WBC 5.5 5.2  NEUTROABS 2.4  --   HGB 12.6* 11.7*  HCT 39.5 38.2*  MCV 74.4* 77.6*  PLT 318 261   Cardiac Enzymes: No results for input(s): "CKTOTAL", "CKMB", "CKMBINDEX", "TROPONINI" in the last 168 hours. BNP: BNP (last 3 results) No results for input(s): "BNP" in the last 8760 hours.  ProBNP (last 3 results) No results for input(s): "PROBNP" in the last 8760 hours.  CBG: No results for input(s): "GLUCAP" in the last 168 hours.     Signed:  Rhetta Mura MD   [facility] 03/22/2023, 9:38 AM

## 2023-03-27 ENCOUNTER — Ambulatory Visit: Payer: Self-pay | Attending: Internal Medicine | Admitting: Internal Medicine

## 2023-03-27 ENCOUNTER — Encounter: Payer: Self-pay | Admitting: Internal Medicine

## 2023-03-27 VITALS — BP 134/58 | HR 78 | Ht 76.0 in | Wt 210.0 lb

## 2023-03-27 DIAGNOSIS — I214 Non-ST elevation (NSTEMI) myocardial infarction: Secondary | ICD-10-CM

## 2023-03-27 DIAGNOSIS — R072 Precordial pain: Secondary | ICD-10-CM | POA: Diagnosis not present

## 2023-03-27 DIAGNOSIS — I4891 Unspecified atrial fibrillation: Secondary | ICD-10-CM | POA: Diagnosis not present

## 2023-03-27 DIAGNOSIS — E0590 Thyrotoxicosis, unspecified without thyrotoxic crisis or storm: Secondary | ICD-10-CM | POA: Diagnosis not present

## 2023-03-27 MED ORDER — APIXABAN 5 MG PO TABS: 5.0000 mg | ORAL_TABLET | Freq: Two times a day (BID) | ORAL | Status: DC

## 2023-03-27 MED ORDER — SPIRONOLACTONE 25 MG PO TABS: 25.0000 mg | ORAL_TABLET | Freq: Every day | ORAL | 3 refills | Status: DC

## 2023-03-27 MED ORDER — APIXABAN 5 MG PO TABS: 5.0000 mg | ORAL_TABLET | Freq: Two times a day (BID) | ORAL | 3 refills | Status: DC

## 2023-03-27 NOTE — Progress Notes (Signed)
Cardiology Office Note:  .    Date:  03/27/2023  ID:  Aaron [name], DOB 02-27-1986, MRN [id number] PCP: Patient, No Pcp Per  [facility] Providers Cardiologist:  Aaron Constant, MD     CC: Establish with outpatient cardiology  History of Present Illness: Aaron [name]    Aaron [name] is a 37 y.o. male prior NSTEMI and thyroid disease associated with AF  Aaron [name], a 37 year old male with a history of hyperthyroidism, atrial fibrillation, and a non-ST elevation myocardial infarction (NSTEMI), presents with worsening symptoms. He was initially diagnosed with hyperthyroidism and started on methimazole following the NSTEMI event when I met him in 2023. Despite multiple follow-up visits, the patient's condition has not improved significantly.  The patient's cardiac history is notable for a heart catheterization revealing no obstructive coronary disease thought with apical scar from his 2023 CMR. A cardiac MRI in 2023 showed a small apical transmural leg and limb enhancement, possibly associated with an embolic event. In 2024 an echocardiogram showing a low-normal ejection fraction with mild biventricular dysfunction. 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   The patient reports feeling unwell, with symptoms including fatigue, shortness of breath, and occasional nausea. He describes a sensation of his heart racing, particularly during periods of stress. He also reports a short, sharp pain in his back. These symptoms have been persistent and have affected his daily activities.  The patient's medical management has been complicated by issues of nonadherence that he notes is resolving, and he currently lacks a primary care provider. Despite these challenges, the patient is motivated to improve his health and is open to aggressive treatment strategies.  Relevant histories: .  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   Studies Reviewed: .   Cardiac Studies & Procedures   CARDIAC CATHETERIZATION  CARDIAC CATHETERIZATION 10/22/2021  Narrative Conclusions: No angiographically significant coronary artery disease. Normal left ventricular filling pressure (LVEDP ~15 mmHg). Question left ventricular outflow versus intracavitary gradient (~45 mmHg).  Recommendations: No coronary artery disease to explain elevated troponin.  Agree with plan for cardiac MRI tomorrow to assess for myocarditis and hypertrophic cardiomyopathy. Primary prevention of coronary artery disease.  Aaron Kendall, MD [facility]  Findings Coronary Findings Diagnostic  Dominance: Right  Left Main Vessel is large. Vessel is angiographically normal.  Left Anterior Descending Vessel is large. Vessel is angiographically normal.  First Diagonal Branch Vessel is large in size.  Ramus Intermedius Vessel is small. Vessel is angiographically normal.  Left Circumflex Vessel is large. Vessel is angiographically normal.  First Obtuse Marginal Branch Vessel is large in size.  Second Obtuse Marginal Branch Vessel is moderate in size.  Third Obtuse Marginal Branch Vessel is moderate in size.  Fourth Obtuse Marginal Branch Vessel is small in size.  Right Coronary Artery Vessel is large. Vessel is angiographically normal.  Right Posterior Descending Artery Vessel is large in size.  Right Posterior Atrioventricular Artery Vessel is large in size.  First Right Posterolateral Branch Vessel is small in size.  Second Right Posterolateral Branch Vessel is moderate in size.  Third Right Posterolateral Branch Vessel is moderate in size.  Intervention  No interventions have been documented.     ECHOCARDIOGRAM  ECHOCARDIOGRAM COMPLETE 03/20/2023  Narrative ECHOCARDIOGRAM REPORT    Patient Name:   Aaron [name] Date of Exam: 03/20/2023 Medical Rec #:  [id number]    Height:       76.0 in Accession #:    [id number]   Weight:       205.0 lb Date of Birth:  19-Jun-1985    BSA:          2.239 m Patient Age:    36 years     BP:           141/85 mmHg Patient Gender: M            HR:           72 bpm. Exam Location:  Inpatient  Procedure: 2D Echo, Cardiac Doppler and Color Doppler  Indications:    Atrial Fibrillation  History:        Patient has prior history of Echocardiogram examinations, most recent 04/01/2022. Arrythmias:Atrial Flutter; Risk Factors:Hypertension.  Sonographer:    Aaron [name] Referring Phys: [id number] MIR M [facility]  IMPRESSIONS   1. Left ventricular ejection fraction, by estimation, is 50%. The left ventricle has low normal function. The left ventricle has no regional wall motion abnormalities. There is mild left ventricular hypertrophy. Left ventricular diastolic parameters are indeterminate. 2. Right ventricular systolic function is mildly reduced. The right ventricular size is normal. There is mildly elevated pulmonary artery systolic pressure. The estimated right ventricular systolic pressure is 37.4 mmHg. 3. Left atrial size was severely dilated. 4. Right atrial size was moderately dilated. 5. Bileaflet mitral valve prolapse with posterior > anterior prolapse and severe, eccentric anteriorly directed jet of MR. The mitral valve is myxomatous. Severe mitral valve regurgitation. No evidence of mitral stenosis. 6. The aortic valve is grossly normal. Aortic valve regurgitation is not visualized. No aortic stenosis is present. 7. Mildly dilated pulmonary artery. 8. The inferior vena cava is dilated in size with >50% respiratory variability, suggesting right atrial pressure of 8 mmHg.  FINDINGS Left Ventricle: Left ventricular ejection fraction, by estimation, is 50%. The left ventricle has low normal function. The left ventricle has no regional wall motion abnormalities. The left ventricular internal cavity size was  normal in size. There is mild left ventricular hypertrophy. Left ventricular diastolic parameters are indeterminate.  Right Ventricle: The right ventricular size is normal. No increase in right ventricular wall thickness. Right ventricular systolic function is mildly reduced. There is mildly elevated pulmonary artery systolic pressure. The tricuspid regurgitant velocity is 2.71 m/s, and with an assumed right atrial pressure of 8 mmHg, the estimated right ventricular systolic pressure is 37.4 mmHg.  Left Atrium: Left atrial size was severely dilated.  Right Atrium: Right atrial size was moderately dilated.  Pericardium: There is no evidence of pericardial effusion.  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 The mitral valve is myxomatous. Severe mitral valve regurgitation. No evidence of mitral valve stenosis. MV peak gradient, 78.5 mmHg. The mean mitral valve gradient is 37.0 mmHg.  Tricuspid Valve: The tricuspid valve is normal in structure. Tricuspid valve regurgitation is mild . No evidence of tricuspid stenosis.  Aortic Valve: The aortic valve is grossly normal. Aortic valve regurgitation is not visualized. No aortic stenosis is present. Aortic valve mean gradient measures 4.0 mmHg. Aortic valve peak gradient measures 6.5 mmHg. Aortic valve area, by VTI measures 2.17 cm.  Pulmonic Valve: The pulmonic valve was normal in structure. Pulmonic valve regurgitation is trivial. No evidence of pulmonic stenosis.  Aorta: The aortic root is normal in size and structure.  Pulmonary Artery: The pulmonary  artery is mildly dilated.  Venous: The inferior vena cava is dilated in size with greater than 50% respiratory variability, suggesting right atrial pressure of 8 mmHg.  IAS/Shunts: No atrial level shunt detected by color flow Doppler.   LEFT VENTRICLE PLAX 2D LVIDd:         5.40 cm    Diastology LVIDs:         3.80 cm   LV e' medial:    9.00 cm/s LV PW:         1.20 cm   LV E/e' medial:  17.9 LV IVS:        1.30 cm   LV e' lateral:   15.77 cm/s LVOT diam:     2.00 cm   LV E/e' lateral: 10.2 LV SV:         40 LV SV Index:   18 LVOT Area:     3.14 cm   RIGHT VENTRICLE             IVC RV Basal diam:  4.50 cm     IVC diam: 2.50 cm RV S prime:     12.07 cm/s TAPSE (M-mode): 2.4 cm  LEFT ATRIUM              Index         RIGHT ATRIUM           Index LA diam:        4.40 cm  1.97 cm/m    RA Area:     27.00 cm LA Vol (A2C):   285.0 ml 127.31 ml/m  RA Volume:   91.40 ml  40.83 ml/m LA Vol (A4C):   115.0 ml 51.37 ml/m LA Biplane Vol: 183.0 ml 81.75 ml/m AORTIC VALVE AV Area (Vmax):    2.30 cm AV Area (Vmean):   2.09 cm AV Area (VTI):     2.17 cm AV Vmax:           127.00 cm/s AV Vmean:          94.400 cm/s AV VTI:            0.186 m AV Peak Grad:      6.5 mmHg AV Mean Grad:      4.0 mmHg LVOT Vmax:         92.93 cm/s LVOT Vmean:        62.900 cm/s LVOT VTI:          0.129 m LVOT/AV VTI ratio: 0.69  AORTA Ao Root diam: 3.00 cm Ao Asc diam:  2.80 cm  MITRAL VALVE                TRICUSPID VALVE MV Area (PHT): 4.33 cm     TR Peak grad:   29.4 mmHg MV Area VTI:   0.49 cm     TR Vmax:        271.00 cm/s MV Peak grad:  78.5 mmHg MV Mean grad:  37.0 mmHg    SHUNTS MV Vmax:       4.43 m/s     Systemic VTI:  0.13 m MV Vmean:      272.0 cm/s   Systemic Diam: 2.00 cm MV Decel Time: 175 msec MV E velocity: 161.00 cm/s  Weston Brass MD Electronically signed by Weston Brass MD Signature Date/Time: 03/20/2023/10:41:56 PM    Final      CARDIAC MRI  MR CARDIAC MORPHOLOGY W WO CONTRAST 10/23/2021  Narrative CLINICAL DATA:  Clinical question of  NSTEMI Study assumes BSA of 2.15 m2.  EXAM: CARDIAC MRI  TECHNIQUE: The patient was scanned on a 1.5 Tesla GE magnet. A dedicated cardiac coil was used. Functional imaging was done using  Fiesta sequences. 2,3, and 4 chamber views were done to assess for RWMA's. Modified Simpson's rule using a short axis stack was used to calculate an ejection fraction on a dedicated work [employer] officer, [employer]. The patient received 10 cc of Gadavist. After 10 minutes inversion recovery sequences were used to assess for infiltration and scar tissue.  CONTRAST:  10 cc  of Gadavist  FINDINGS: 1. Mildly increased left ventricular size, with LVEDD 57 mm, and LVEDVi 107 mL/m2.  Mild asymmetric septal hypertrophy, with intraventricular septal thickness of 12 mm, posterior wall thickness of 7 mm, and myocardial mass index of 82 g/m2.  Mildly decreased left ventricular systolic function (LVEF =49%). There is apical inferior hypokinesis.  Left ventricular parametric mapping notable for normal T2.  There is late gadolinium enhancement in the left ventricular myocardium: Transmural apical septal LGE.  2.  Mildly increased right ventricular size with RVEDVI 99 mL/m2.  Normal right ventricular thickness.  Mildly reduced right ventricular systolic function (RVEF =46%). There are no regional wall motion abnormalities or aneurysms.  3.  Normal left and right atrial size.  4. Normal size of the aortic root, ascending aorta and pulmonary artery.  5. Valve assessment:  Aortic Valve: Tri-leaflet aortic valve. Regurgitation fraction 3%, Mean gradient 1 mmHg. There is no significant regurgitation or stenosis.  Pulmonic Valve: Regurgitation fraction 8%, Mean gradient < 1 mmHg. There is no significant regurgitation or stenosis.  Tricuspid Valve: Regurgitation fraction 27%. Mild to moderate tricuspid regurgitation.  Mitral Valve: Regurgitation fraction 16%. Mild mitral regurgitation.  6.  Normal pericardium.  No pericardial effusion.  7. Grossly, no extracardiac findings. Recommended dedicated study if concerned for non-cardiac pathology.  IMPRESSION: LVEF 49%  Small apical  transmural LGE consistent with scar.  Riley Lam MD   Electronically Signed By: Riley Lam M.D. On: 10/23/2021 21:41         Risk Assessment/Calculations:     CHA2DS2-VASc Score = 2   This indicates a 2.2% annual risk of stroke. The patient's score is based upon: CHF History: 1 HTN History: 1 Diabetes History: 0 Stroke History: 0 Vascular Disease History: 0 Age Score: 0 Gender Score: 0          Physical Exam:    VS:  BP (!) 134/58   Pulse 78   Ht 6\' 4"  (1.93 m)   Wt 210 lb (95.3 kg)   SpO2 99%   BMI 25.56 kg/m    Wt Readings from Last 3 Encounters:  03/27/23 210 lb (95.3 kg)  03/20/23 196 lb 9.6 oz (89.2 kg)  03/31/22 190 lb 7.6 oz (86.4 kg)    Gen: no distress   Neck: No JVD Cardiac: No Rubs or Gallops, holosystolic murmur, RRR +2 radial pulses Respiratory: Clear to auscultation bilaterally, normal effort, normal  respiratory rate GI: Soft, nontender, non-distended  MS: No  edema;  moves all extremities Integument: Skin feels warm Neuro:  At time of evaluation, alert and oriented to person/place/time/situation  Psych: Normal affect, patient feels well   ASSESSMENT AND PLAN: .    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 Symptoms include fatigue, shortness of breath. Discussed potential need for surgery if medical management  fails (I cannot review his echo due to internet outage today; from still frames looks at least mod-severe), including risks and benefits. - Start spironolactone 25 mg PO daily - Order repeat blood work in one week - If labs are stable, start Jardiance 10 mg - If labs are not safe for diuretic increase, convert metoprolol to succinate - see PA/NP in f/u  - at that time, increase GDMT and schedule transesophageal echocardiogram - Refer to surgery if severe mitral regurgitation  confirmed despite GDMT  Paroxysmal Atrial Fibrillation Intermittent episodes contributing to fatigue and shortness of breath. CHADS-VASc score of 2 (HTN, HfmrEF)Discussed stroke risk and benefits of Eliquis, which reduces stroke risk by approximately 60%. - Start Eliquis for anticoagulation  Hx of NSTEMI - NSTEMI in 2023 with no obstructive coronary disease. Small apical transmural scar possibly  due to embolic event. This would  increase the need for DOAC  Hyperthyroidism - Hyperthyroidism diagnosed in 2023, managed by another provider. Contributing to overall cardiac condition. Emphasized importance of managing hyperthyroidism to improve cardiac symptoms. - Follow-up with endocrinologist  General Health Maintenance - Lacks a primary care provider and requires comprehensive management of multiple chronic conditions. Discussed need for coordinated care. - Refer to primary care for ongoing management - Refer to  social work for additional support  Follow-up - Schedule follow-up appointment in December with PA or NP teammate - will then get TEE with me or structural imager with continued GDMT titration - will then get surgery eval if appropriate   Riley Lam, MD FASE [facility] Cardiologist [facility]  [address], [address] [place], [place] [address] [phone]  3:50 PM

## 2023-03-27 NOTE — Patient Instructions (Signed)
Medication Instructions:  Your physician has recommended you make the following change in your medication:  START: spironolactone (Aldactone) 25 mg by mouth once daily  START: apixaban (Eliquis) 5 mg by mouth twice daily, we have provided you with 28 days of samples, a 30 day free trial offer and information for patient assistance.   *If you need a refill on your cardiac medications before your next appointment, please call your pharmacy*   Lab Work: IN 1 WEEK: BMP  If you have labs (blood work) drawn today and your tests are completely normal, you will receive your results only by: MyChart Message (if you have MyChart) OR A paper copy in the mail If you have any lab test that is abnormal or we need to change your treatment, we will call you to review the results.   Testing/Procedures: Your physician has referred you to Social Work.  Your physician has placed a referral to assist you with establishing care with a Primary Care Provider.    Follow-Up: At [facility], you and your health needs are our priority.  As part of our continuing mission to provide you with exceptional heart care, we have created designated Provider Care Teams.  These Care Teams include your primary Cardiologist (physician) and Advanced Practice Providers (APPs -  Physician Assistants and Nurse Practitioners) who all work together to provide you with the care you need, when you need it.   Your next appointment:   1 month(s)  Provider:    Jari Favre, PA-C, Ronie Spies, PA-C, [name] Searing, NP, Jacolyn Reedy, PA-C, Eligha Bridegroom, NP, Tereso Newcomer, PA-C, or Perlie Gold, PA-C

## 2023-03-31 ENCOUNTER — Telehealth: Payer: Self-pay | Admitting: Licensed Clinical Social Worker

## 2023-03-31 NOTE — Telephone Encounter (Signed)
H&V Care Navigation CSW Progress Note  Clinical Social Worker contacted patient by phone to f/u on patient assistance referral. Pt currently uninsured with no PCP. Recent hospital visit has been assigned to [facility] financial counselors who attempted to reach pt and have had to leave voicemails. I attempted pt this morning at [phone], no answer, left voicemail requesting return call. Will re-attempt again as able.  Patient is participating in a Managed Medicaid Plan:  No, self pay only.  SDOH Screenings   Food Insecurity: No Food Insecurity (03/21/2023)  Housing: Low Risk  (03/21/2023)  Transportation Needs: No Transportation Needs (03/21/2023)  Utilities: Not At Risk (03/21/2023)  Tobacco Use: Low Risk  (03/27/2023)    Aaron [name], MSW, LCSW Clinical Social Worker II [facility] Heart/Vascular Care Navigation  [phone]- work cell phone (preferred) [phone]- desk phone

## 2023-04-01 ENCOUNTER — Telehealth: Payer: Self-pay | Admitting: Licensed Clinical Social Worker

## 2023-04-01 NOTE — Progress Notes (Unsigned)
Heart and Vascular Care Navigation  04/01/2023  Aaron [name] [facility] 1986-03-21 [id number]  Reason for Referral: uninsured, assistance with medical bills/disability Patient is participating in a Managed Medicaid Plan: No, self pay, has been referred to [facility].  Engaged with patient by telephone for initial visit for Heart and Vascular Care Coordination.                                                                                                   Assessment:    LCSW received return call from pt stating he had received my calls. Confirmed home address- resides with significant other and minor son. Does not have a regular PCP. Is not clear about current work status. He has not applied for Medicaid and does not receive SNAP. He shares he doesn't usually take the personal initiative to f/u on these things/apply if eligible.  States his significant other mostly manages rent/utilities etc. No issues shared. He drives himself to and from appointments and would prefer to establish with a PCP in [facility] despite me offering [facility] referral.   LCSW provided encouragement and number for pt to reach out to [facility] (financial counseling team) to be screened for Medicaid and disability. Pt states he will do so. I then discussed if ineligible for Medicaid that we can assist completing [facility] Financial Assistance for bill assistance and [facility] for medications as eligible.    I note that pt was started on Eliquis, per notes was given patient assistance but unclear if pt was given paperwork and he doesn't think he left appt with anything to complete. LCSW will send this to pt and encouraged him to complete as soon as he receives it to prevent delay.   No additional questions at this time.   HRT/VAS Care Coordination     Patients Home Cardiology Office [facility]   Outpatient Care Team Social Worker   Social Worker Name: Octavio Graves, [place], [phone]   Living  arrangements for the past 2 months Single Family Home   Lives with: Significant Other; Minor Children   Patient Current Insurance Coverage Self-Pay   Patient Has Concern With Paying Medical Bills Yes   Patient Concerns With Medical Bills uninsured, ongoing medical care needed   Medical Bill Referrals: [facility] Referral;  [facility] Financial Assistance   Does Patient Have Prescription Coverage? No   Patient Prescription Assistance Programs Patient Assistance Programs   Home Assistive Devices/Equipment None       Social History:                                                                             SDOH Screenings   Food Insecurity: Food Insecurity Present (04/01/2023)  Housing: Low Risk  (04/01/2023)  Transportation Needs: No Transportation Needs (04/01/2023)  Utilities:  Not At Risk (04/01/2023)  Financial Resource Strain: Medium Risk (04/01/2023)  Tobacco Use: Low Risk  (03/27/2023)  Health Literacy: Adequate Health Literacy (04/01/2023)    SDOH Interventions: Financial Resources:  Financial Strain Interventions: Other (Comment) (unclear work hx- referred to [employer] for [employer]; if not eligible for Medicaid he has been sent CAFA; sent community care clinics list for PCP; sent Eliquis Patient Chief of Staff) DSS for financial assistance, Editor, commissioning for [employer], and [employer]  Food Insecurity:  Food Insecurity Interventions: Other (Comment) (sent [employer] card to apply for [employer])  Housing Insecurity:  Housing Interventions: Intervention Not Indicated (significant other "handles this")  Transportation:   Transportation Interventions: Intervention Not Indicated   Other Care Navigation Interventions:     Provided Pharmacy assistance resources Patient Assistance Programs- mailed [employer] application for    Follow-up plan:   LCSW mailed pt the following: my card, [employer],  [employer] application for [employer], [employer] enrollment card from [employer] and [employer]. Encouraged pt to call [facility] when we hang up the call today to be screened for Medicaid before completing any additional applications. Will f/u to ensure pt has completed these things and answer any additional questions.

## 2023-04-02 ENCOUNTER — Telehealth: Payer: Self-pay | Admitting: Licensed Clinical Social Worker

## 2023-04-02 NOTE — Telephone Encounter (Signed)
H&V Care Navigation CSW Progress Note  Clinical Social Worker  contacted [employer]  to see if pt had been able to reach them back yesterday. They confirm they spoke with pt and completed forms needed for Medicaid application. It will be completed and submitted to DSS. LCSW will follow for determination.  Patient is participating in a Managed Medicaid Plan:  No, Medicaid pending  SDOH Screenings   Food Insecurity: Food Insecurity Present (04/01/2023)  Housing: Low Risk  (04/01/2023)  Transportation Needs: No Transportation Needs (04/01/2023)  Utilities: Not At Risk (04/01/2023)  Financial Resource Strain: Medium Risk (04/01/2023)  Tobacco Use: Low Risk  (03/27/2023)  Health Literacy: Adequate Health Literacy (04/01/2023)    Octavio Graves, MSW, LCSW Clinical Social Worker II [facility] Heart/Vascular Care Navigation  [phone]- work cell phone (preferred) [phone]- desk phone

## 2023-04-07 ENCOUNTER — Telehealth: Payer: Self-pay | Admitting: Licensed Clinical Social Worker

## 2023-04-07 NOTE — Telephone Encounter (Signed)
H&V Care Navigation CSW Progress Note  Clinical Social Worker contacted patient by phone to f/u on Medicaid referral (pt has been screened but needs to submit additional documents per account notes), and community resources sent by this Clinical research associate. No answer this afternoon at [phone], left voicemail requesting call back to me or [employer] team and provided both numbers. Noted our upcoming holiday closures.  Patient is participating in a Managed Medicaid Plan:  No, Medicaid pending  SDOH Screenings   Food Insecurity: Food Insecurity Present (04/01/2023)  Housing: Low Risk  (04/01/2023)  Transportation Needs: No Transportation Needs (04/01/2023)  Utilities: Not At Risk (04/01/2023)  Financial Resource Strain: Medium Risk (04/01/2023)  Tobacco Use: Low Risk  (03/27/2023)  Health Literacy: Adequate Health Literacy (04/01/2023)    Octavio Graves, MSW, LCSW Clinical Social Worker II [facility] Heart/Vascular Care Navigation  [phone]- work cell phone (preferred) [phone]- desk phone

## 2023-04-08 ENCOUNTER — Telehealth: Payer: Self-pay | Admitting: Licensed Clinical Social Worker

## 2023-04-08 NOTE — Telephone Encounter (Signed)
H&V Care Navigation CSW Progress Note  Clinical Social Worker contacted patient by phone to f/u again on Medicaid referral to [employer]. Was able to reach him at [phone]. Pt unsure if he needs to submit additional information and hasn't called [employer]. Provided him [name] contact and encouraged him to call today to prevent delay of processing. Pt agreeable. Inquired if he received additional resources, he thinks he has, isn't sure and hasn't really looked into it yet. Requested he look through mail and see if received. I will contact him when back in office after holiday to see if any updates.   Patient is participating in a Managed Medicaid Plan:  no, self pay only  SDOH Screenings   Food Insecurity: Food Insecurity Present (04/01/2023)  Housing: Low Risk  (04/01/2023)  Transportation Needs: No Transportation Needs (04/01/2023)  Utilities: Not At Risk (04/01/2023)  Financial Resource Strain: Medium Risk (04/01/2023)  Tobacco Use: Low Risk  (03/27/2023)  Health Literacy: Adequate Health Literacy (04/01/2023)    Aaron [name], MSW, LCSW Clinical Social Worker II [facility] Health Heart/Vascular Care Navigation  [phone]- work cell phone (preferred) [phone]- desk phone

## 2023-04-14 ENCOUNTER — Telehealth: Payer: Self-pay | Admitting: Licensed Clinical Social Worker

## 2023-04-14 NOTE — Telephone Encounter (Signed)
H&V Care Navigation CSW Progress Note  Clinical Social Worker contacted patient by phone to f/u on Medicaid approval. Note he was approved for [facility]. No answer when I called today at [phone], left voicemail and will mail Medicaid information to him to bring to upcoming appts.  Patient is participating in a Managed Medicaid Plan:  Yes-[employer] [id number]  SDOH Screenings   Food Insecurity: Food Insecurity Present (04/01/2023)  Housing: Low Risk  (04/01/2023)  Transportation Needs: No Transportation Needs (04/01/2023)  Utilities: Not At Risk (04/01/2023)  Financial Resource Strain: Medium Risk (04/01/2023)  Tobacco Use: Low Risk  (03/27/2023)  Health Literacy: Adequate Health Literacy (04/01/2023)    Octavio Graves, MSW, LCSW Clinical Social Worker II [facility] Health Heart/Vascular Care Navigation  [phone]- work cell phone (preferred) [phone]- desk phone

## 2023-04-15 NOTE — Telephone Encounter (Signed)
H&V Care Navigation CSW Progress Note  Clinical Social Worker contacted patient by phone to f/u again on Medicaid approval. No answer again, left additional voicemail. I remain available should pt wish to re-engage with Care Navigation team.   Patient is participating in a Managed Medicaid Plan:  Yes-[employer] Medicaid  SDOH Screenings   Food Insecurity: Food Insecurity Present (04/01/2023)  Housing: Low Risk  (04/01/2023)  Transportation Needs: No Transportation Needs (04/01/2023)  Utilities: Not At Risk (04/01/2023)  Financial Resource Strain: Medium Risk (04/01/2023)  Tobacco Use: Low Risk  (03/27/2023)  Health Literacy: Adequate Health Literacy (04/01/2023)   Aaron [name], MSW, LCSW Clinical Social Worker II [facility] Health Heart/Vascular Care Navigation  [phone]- work cell phone (preferred) [phone]- desk phone

## 2023-04-29 ENCOUNTER — Ambulatory Visit: Payer: MEDICAID | Admitting: Nurse Practitioner

## 2023-07-09 ENCOUNTER — Inpatient Hospital Stay (HOSPITAL_COMMUNITY)
Admission: EM | Admit: 2023-07-09 | Discharge: 2023-08-04 | DRG: 001 | Disposition: A | Discharge status: Rehabilitation Facility | Payer: Medicaid Other | Attending: Internal Medicine | Admitting: Internal Medicine

## 2023-07-09 ENCOUNTER — Other Ambulatory Visit: Payer: Self-pay

## 2023-07-09 ENCOUNTER — Emergency Department (HOSPITAL_COMMUNITY): Payer: Medicaid Other

## 2023-07-09 ENCOUNTER — Encounter (HOSPITAL_COMMUNITY): Payer: Self-pay

## 2023-07-09 DIAGNOSIS — E872 Acidosis, unspecified: Secondary | ICD-10-CM | POA: Diagnosis not present

## 2023-07-09 DIAGNOSIS — J9811 Atelectasis: Secondary | ICD-10-CM | POA: Diagnosis not present

## 2023-07-09 DIAGNOSIS — E43 Unspecified severe protein-calorie malnutrition: Secondary | ICD-10-CM | POA: Insufficient documentation

## 2023-07-09 DIAGNOSIS — Z86711 Personal history of pulmonary embolism: Secondary | ICD-10-CM | POA: Diagnosis not present

## 2023-07-09 DIAGNOSIS — I743 Embolism and thrombosis of arteries of the lower extremities: Secondary | ICD-10-CM | POA: Diagnosis present

## 2023-07-09 DIAGNOSIS — R34 Anuria and oliguria: Secondary | ICD-10-CM | POA: Diagnosis not present

## 2023-07-09 DIAGNOSIS — I4901 Ventricular fibrillation: Secondary | ICD-10-CM | POA: Diagnosis not present

## 2023-07-09 DIAGNOSIS — Z978 Presence of other specified devices: Secondary | ICD-10-CM | POA: Diagnosis not present

## 2023-07-09 DIAGNOSIS — J90 Pleural effusion, not elsewhere classified: Secondary | ICD-10-CM | POA: Diagnosis not present

## 2023-07-09 DIAGNOSIS — R188 Other ascites: Secondary | ICD-10-CM | POA: Diagnosis not present

## 2023-07-09 DIAGNOSIS — D689 Coagulation defect, unspecified: Secondary | ICD-10-CM | POA: Diagnosis present

## 2023-07-09 DIAGNOSIS — J9601 Acute respiratory failure with hypoxia: Secondary | ICD-10-CM

## 2023-07-09 DIAGNOSIS — F10939 Alcohol use, unspecified with withdrawal, unspecified: Secondary | ICD-10-CM | POA: Diagnosis present

## 2023-07-09 DIAGNOSIS — Z9281 Personal history of extracorporeal membrane oxygenation (ECMO): Secondary | ICD-10-CM | POA: Diagnosis not present

## 2023-07-09 DIAGNOSIS — I340 Nonrheumatic mitral (valve) insufficiency: Secondary | ICD-10-CM | POA: Diagnosis present

## 2023-07-09 DIAGNOSIS — I4892 Unspecified atrial flutter: Secondary | ICD-10-CM | POA: Diagnosis not present

## 2023-07-09 DIAGNOSIS — I4891 Unspecified atrial fibrillation: Secondary | ICD-10-CM | POA: Diagnosis not present

## 2023-07-09 DIAGNOSIS — Z7189 Other specified counseling: Secondary | ICD-10-CM | POA: Diagnosis not present

## 2023-07-09 DIAGNOSIS — I2782 Chronic pulmonary embolism: Secondary | ICD-10-CM | POA: Diagnosis not present

## 2023-07-09 DIAGNOSIS — Z91148 Patient's other noncompliance with medication regimen for other reason: Secondary | ICD-10-CM

## 2023-07-09 DIAGNOSIS — R509 Fever, unspecified: Secondary | ICD-10-CM | POA: Diagnosis not present

## 2023-07-09 DIAGNOSIS — R000 Tachycardia, unspecified: Secondary | ICD-10-CM

## 2023-07-09 DIAGNOSIS — Z781 Physical restraint status: Secondary | ICD-10-CM

## 2023-07-09 DIAGNOSIS — G931 Anoxic brain damage, not elsewhere classified: Secondary | ICD-10-CM | POA: Diagnosis not present

## 2023-07-09 DIAGNOSIS — Z515 Encounter for palliative care: Secondary | ICD-10-CM | POA: Diagnosis not present

## 2023-07-09 DIAGNOSIS — I2510 Atherosclerotic heart disease of native coronary artery without angina pectoris: Secondary | ICD-10-CM | POA: Diagnosis present

## 2023-07-09 DIAGNOSIS — N179 Acute kidney failure, unspecified: Secondary | ICD-10-CM | POA: Diagnosis not present

## 2023-07-09 DIAGNOSIS — R591 Generalized enlarged lymph nodes: Secondary | ICD-10-CM | POA: Diagnosis not present

## 2023-07-09 DIAGNOSIS — D6959 Other secondary thrombocytopenia: Secondary | ICD-10-CM | POA: Diagnosis present

## 2023-07-09 DIAGNOSIS — R5381 Other malaise: Secondary | ICD-10-CM | POA: Diagnosis present

## 2023-07-09 DIAGNOSIS — E039 Hypothyroidism, unspecified: Secondary | ICD-10-CM | POA: Diagnosis not present

## 2023-07-09 DIAGNOSIS — R319 Hematuria, unspecified: Secondary | ICD-10-CM | POA: Diagnosis not present

## 2023-07-09 DIAGNOSIS — I517 Cardiomegaly: Secondary | ICD-10-CM | POA: Diagnosis not present

## 2023-07-09 DIAGNOSIS — I272 Pulmonary hypertension, unspecified: Secondary | ICD-10-CM | POA: Diagnosis present

## 2023-07-09 DIAGNOSIS — I472 Ventricular tachycardia, unspecified: Secondary | ICD-10-CM | POA: Diagnosis not present

## 2023-07-09 DIAGNOSIS — I63441 Cerebral infarction due to embolism of right cerebellar artery: Secondary | ICD-10-CM | POA: Diagnosis not present

## 2023-07-09 DIAGNOSIS — I493 Ventricular premature depolarization: Secondary | ICD-10-CM | POA: Diagnosis not present

## 2023-07-09 DIAGNOSIS — I480 Paroxysmal atrial fibrillation: Secondary | ICD-10-CM | POA: Diagnosis not present

## 2023-07-09 DIAGNOSIS — R9431 Abnormal electrocardiogram [ECG] [EKG]: Secondary | ICD-10-CM | POA: Diagnosis present

## 2023-07-09 DIAGNOSIS — D638 Anemia in other chronic diseases classified elsewhere: Secondary | ICD-10-CM | POA: Diagnosis not present

## 2023-07-09 DIAGNOSIS — I7090 Unspecified atherosclerosis: Secondary | ICD-10-CM | POA: Diagnosis not present

## 2023-07-09 DIAGNOSIS — N170 Acute kidney failure with tubular necrosis: Secondary | ICD-10-CM | POA: Diagnosis present

## 2023-07-09 DIAGNOSIS — I252 Old myocardial infarction: Secondary | ICD-10-CM

## 2023-07-09 DIAGNOSIS — E162 Hypoglycemia, unspecified: Secondary | ICD-10-CM | POA: Diagnosis not present

## 2023-07-09 DIAGNOSIS — R9389 Abnormal findings on diagnostic imaging of other specified body structures: Secondary | ICD-10-CM | POA: Diagnosis not present

## 2023-07-09 DIAGNOSIS — I2699 Other pulmonary embolism without acute cor pulmonale: Secondary | ICD-10-CM | POA: Diagnosis not present

## 2023-07-09 DIAGNOSIS — I110 Hypertensive heart disease with heart failure: Secondary | ICD-10-CM | POA: Diagnosis not present

## 2023-07-09 DIAGNOSIS — I428 Other cardiomyopathies: Secondary | ICD-10-CM | POA: Diagnosis not present

## 2023-07-09 DIAGNOSIS — I639 Cerebral infarction, unspecified: Secondary | ICD-10-CM | POA: Diagnosis not present

## 2023-07-09 DIAGNOSIS — R110 Nausea: Secondary | ICD-10-CM | POA: Diagnosis not present

## 2023-07-09 DIAGNOSIS — J15211 Pneumonia due to Methicillin susceptible Staphylococcus aureus: Secondary | ICD-10-CM | POA: Diagnosis not present

## 2023-07-09 DIAGNOSIS — R1310 Dysphagia, unspecified: Secondary | ICD-10-CM | POA: Diagnosis not present

## 2023-07-09 DIAGNOSIS — Z5986 Financial insecurity: Secondary | ICD-10-CM

## 2023-07-09 DIAGNOSIS — R590 Localized enlarged lymph nodes: Secondary | ICD-10-CM | POA: Diagnosis present

## 2023-07-09 DIAGNOSIS — J189 Pneumonia, unspecified organism: Secondary | ICD-10-CM | POA: Diagnosis not present

## 2023-07-09 DIAGNOSIS — Z1152 Encounter for screening for COVID-19: Secondary | ICD-10-CM

## 2023-07-09 DIAGNOSIS — T17890A Other foreign object in other parts of respiratory tract causing asphyxiation, initial encounter: Secondary | ICD-10-CM | POA: Diagnosis present

## 2023-07-09 DIAGNOSIS — I745 Embolism and thrombosis of iliac artery: Secondary | ICD-10-CM | POA: Diagnosis present

## 2023-07-09 DIAGNOSIS — G4700 Insomnia, unspecified: Secondary | ICD-10-CM | POA: Diagnosis not present

## 2023-07-09 DIAGNOSIS — I471 Supraventricular tachycardia, unspecified: Secondary | ICD-10-CM | POA: Diagnosis not present

## 2023-07-09 DIAGNOSIS — I3139 Other pericardial effusion (noninflammatory): Secondary | ICD-10-CM | POA: Diagnosis not present

## 2023-07-09 DIAGNOSIS — Z959 Presence of cardiac and vascular implant and graft, unspecified: Secondary | ICD-10-CM | POA: Diagnosis not present

## 2023-07-09 DIAGNOSIS — K7200 Acute and subacute hepatic failure without coma: Secondary | ICD-10-CM | POA: Diagnosis not present

## 2023-07-09 DIAGNOSIS — K5900 Constipation, unspecified: Secondary | ICD-10-CM | POA: Diagnosis not present

## 2023-07-09 DIAGNOSIS — J984 Other disorders of lung: Secondary | ICD-10-CM | POA: Diagnosis not present

## 2023-07-09 DIAGNOSIS — I469 Cardiac arrest, cause unspecified: Secondary | ICD-10-CM | POA: Diagnosis not present

## 2023-07-09 DIAGNOSIS — I5020 Unspecified systolic (congestive) heart failure: Secondary | ICD-10-CM | POA: Diagnosis not present

## 2023-07-09 DIAGNOSIS — Z992 Dependence on renal dialysis: Secondary | ICD-10-CM | POA: Diagnosis not present

## 2023-07-09 DIAGNOSIS — I5043 Acute on chronic combined systolic (congestive) and diastolic (congestive) heart failure: Secondary | ICD-10-CM | POA: Diagnosis not present

## 2023-07-09 DIAGNOSIS — W44F9XA Other object of natural or organic material, entering into or through a natural orifice, initial encounter: Secondary | ICD-10-CM | POA: Diagnosis present

## 2023-07-09 DIAGNOSIS — I10 Essential (primary) hypertension: Secondary | ICD-10-CM | POA: Diagnosis not present

## 2023-07-09 DIAGNOSIS — R0602 Shortness of breath: Secondary | ICD-10-CM | POA: Diagnosis not present

## 2023-07-09 DIAGNOSIS — R569 Unspecified convulsions: Secondary | ICD-10-CM | POA: Diagnosis not present

## 2023-07-09 DIAGNOSIS — E871 Hypo-osmolality and hyponatremia: Secondary | ICD-10-CM | POA: Diagnosis present

## 2023-07-09 DIAGNOSIS — J9809 Other diseases of bronchus, not elsewhere classified: Secondary | ICD-10-CM | POA: Diagnosis not present

## 2023-07-09 DIAGNOSIS — Z4682 Encounter for fitting and adjustment of non-vascular catheter: Secondary | ICD-10-CM | POA: Diagnosis not present

## 2023-07-09 DIAGNOSIS — E874 Mixed disorder of acid-base balance: Secondary | ICD-10-CM | POA: Diagnosis present

## 2023-07-09 DIAGNOSIS — E875 Hyperkalemia: Secondary | ICD-10-CM | POA: Diagnosis not present

## 2023-07-09 DIAGNOSIS — G9340 Encephalopathy, unspecified: Secondary | ICD-10-CM | POA: Diagnosis not present

## 2023-07-09 DIAGNOSIS — R079 Chest pain, unspecified: Secondary | ICD-10-CM | POA: Diagnosis not present

## 2023-07-09 DIAGNOSIS — D589 Hereditary hemolytic anemia, unspecified: Secondary | ICD-10-CM | POA: Diagnosis present

## 2023-07-09 DIAGNOSIS — D72829 Elevated white blood cell count, unspecified: Secondary | ICD-10-CM | POA: Diagnosis not present

## 2023-07-09 DIAGNOSIS — I5032 Chronic diastolic (congestive) heart failure: Secondary | ICD-10-CM | POA: Diagnosis present

## 2023-07-09 DIAGNOSIS — R570 Cardiogenic shock: Secondary | ICD-10-CM | POA: Diagnosis not present

## 2023-07-09 DIAGNOSIS — R739 Hyperglycemia, unspecified: Secondary | ICD-10-CM | POA: Diagnosis not present

## 2023-07-09 DIAGNOSIS — I219 Acute myocardial infarction, unspecified: Secondary | ICD-10-CM | POA: Diagnosis not present

## 2023-07-09 DIAGNOSIS — J9600 Acute respiratory failure, unspecified whether with hypoxia or hypercapnia: Secondary | ICD-10-CM | POA: Diagnosis not present

## 2023-07-09 DIAGNOSIS — I63549 Cerebral infarction due to unspecified occlusion or stenosis of unspecified cerebellar artery: Secondary | ICD-10-CM | POA: Diagnosis not present

## 2023-07-09 DIAGNOSIS — Z006 Encounter for examination for normal comparison and control in clinical research program: Secondary | ICD-10-CM

## 2023-07-09 DIAGNOSIS — I341 Nonrheumatic mitral (valve) prolapse: Secondary | ICD-10-CM | POA: Diagnosis present

## 2023-07-09 DIAGNOSIS — Z7901 Long term (current) use of anticoagulants: Secondary | ICD-10-CM

## 2023-07-09 DIAGNOSIS — Z9911 Dependence on respirator [ventilator] status: Secondary | ICD-10-CM | POA: Diagnosis not present

## 2023-07-09 DIAGNOSIS — J811 Chronic pulmonary edema: Secondary | ICD-10-CM | POA: Diagnosis not present

## 2023-07-09 DIAGNOSIS — T8111XA Postprocedural  cardiogenic shock, initial encounter: Secondary | ICD-10-CM | POA: Diagnosis not present

## 2023-07-09 DIAGNOSIS — I5021 Acute systolic (congestive) heart failure: Secondary | ICD-10-CM | POA: Diagnosis not present

## 2023-07-09 DIAGNOSIS — T381X5A Adverse effect of thyroid hormones and substitutes, initial encounter: Secondary | ICD-10-CM | POA: Diagnosis present

## 2023-07-09 DIAGNOSIS — R0989 Other specified symptoms and signs involving the circulatory and respiratory systems: Secondary | ICD-10-CM | POA: Diagnosis not present

## 2023-07-09 DIAGNOSIS — N186 End stage renal disease: Secondary | ICD-10-CM | POA: Diagnosis not present

## 2023-07-09 DIAGNOSIS — Z954 Presence of other heart-valve replacement: Secondary | ICD-10-CM | POA: Diagnosis not present

## 2023-07-09 DIAGNOSIS — E0590 Thyrotoxicosis, unspecified without thyrotoxic crisis or storm: Secondary | ICD-10-CM | POA: Diagnosis not present

## 2023-07-09 DIAGNOSIS — R0789 Other chest pain: Secondary | ICD-10-CM | POA: Diagnosis not present

## 2023-07-09 DIAGNOSIS — I509 Heart failure, unspecified: Secondary | ICD-10-CM | POA: Diagnosis not present

## 2023-07-09 DIAGNOSIS — Z452 Encounter for adjustment and management of vascular access device: Secondary | ICD-10-CM | POA: Diagnosis not present

## 2023-07-09 DIAGNOSIS — D649 Anemia, unspecified: Secondary | ICD-10-CM | POA: Diagnosis not present

## 2023-07-09 DIAGNOSIS — R001 Bradycardia, unspecified: Secondary | ICD-10-CM | POA: Diagnosis not present

## 2023-07-09 DIAGNOSIS — D72823 Leukemoid reaction: Secondary | ICD-10-CM | POA: Diagnosis not present

## 2023-07-09 DIAGNOSIS — L89152 Pressure ulcer of sacral region, stage 2: Secondary | ICD-10-CM | POA: Diagnosis not present

## 2023-07-09 DIAGNOSIS — R918 Other nonspecific abnormal finding of lung field: Secondary | ICD-10-CM | POA: Diagnosis not present

## 2023-07-09 DIAGNOSIS — Z79899 Other long term (current) drug therapy: Secondary | ICD-10-CM

## 2023-07-09 DIAGNOSIS — Z6823 Body mass index (BMI) 23.0-23.9, adult: Secondary | ICD-10-CM

## 2023-07-09 DIAGNOSIS — D6489 Other specified anemias: Secondary | ICD-10-CM | POA: Diagnosis present

## 2023-07-09 DIAGNOSIS — I342 Nonrheumatic mitral (valve) stenosis: Secondary | ICD-10-CM | POA: Diagnosis not present

## 2023-07-09 DIAGNOSIS — I462 Cardiac arrest due to underlying cardiac condition: Secondary | ICD-10-CM | POA: Diagnosis not present

## 2023-07-09 DIAGNOSIS — R7401 Elevation of levels of liver transaminase levels: Secondary | ICD-10-CM | POA: Diagnosis not present

## 2023-07-09 DIAGNOSIS — E876 Hypokalemia: Secondary | ICD-10-CM | POA: Diagnosis not present

## 2023-07-09 DIAGNOSIS — J80 Acute respiratory distress syndrome: Secondary | ICD-10-CM | POA: Diagnosis not present

## 2023-07-09 HISTORY — DX: Other pulmonary embolism without acute cor pulmonale: I26.99

## 2023-07-09 HISTORY — DX: Nonrheumatic mitral (valve) insufficiency: I34.0

## 2023-07-09 HISTORY — DX: Unspecified atrial fibrillation: I48.91

## 2023-07-09 HISTORY — DX: Thyrotoxicosis, unspecified without thyrotoxic crisis or storm: E05.90

## 2023-07-09 HISTORY — DX: Non-ST elevation (NSTEMI) myocardial infarction: I21.4

## 2023-07-09 LAB — BASIC METABOLIC PANEL
Anion gap: 9 (ref 5–15)
BUN: 11 mg/dL (ref 6–20)
CO2: 20 mmol/L — ABNORMAL LOW (ref 22–32)
Calcium: 8.7 mg/dL — ABNORMAL LOW (ref 8.9–10.3)
Chloride: 107 mmol/L (ref 98–111)
Creatinine, Ser: 0.81 mg/dL (ref 0.61–1.24)
GFR, Estimated: >60 mL/min (ref >60)
Glucose, Bld: 99 mg/dL (ref 70–99)
Potassium: 3.9 mmol/L (ref 3.5–5.1)
Sodium: 136 mmol/L (ref 135–145)

## 2023-07-09 LAB — HEPATIC FUNCTION PANEL
ALT: 15 U/L (ref 0–44)
AST: 19 U/L (ref 15–41)
Albumin: 3.6 g/dL (ref 3.5–5.0)
Alkaline Phosphatase: 84 U/L (ref 38–126)
Bilirubin, Direct: 0.3 mg/dL — ABNORMAL HIGH (ref 0.0–0.2)
Indirect Bilirubin: 1.4 mg/dL — ABNORMAL HIGH (ref 0.3–0.9)
Total Bilirubin: 1.7 mg/dL — ABNORMAL HIGH (ref 0.0–1.2)
Total Protein: 7 g/dL (ref 6.5–8.1)

## 2023-07-09 LAB — CBC
HCT: 37.3 % — ABNORMAL LOW (ref 39.0–52.0)
Hemoglobin: 11.1 g/dL — ABNORMAL LOW (ref 13.0–17.0)
MCH: 20.7 pg — ABNORMAL LOW (ref 26.0–34.0)
MCHC: 29.8 g/dL — ABNORMAL LOW (ref 30.0–36.0)
MCV: 69.6 fL — ABNORMAL LOW (ref 80.0–100.0)
Platelets: 344 10*3/uL (ref 150–400)
RBC: 5.36 MIL/uL (ref 4.22–5.81)
RDW: 15.7 % — ABNORMAL HIGH (ref 11.5–15.5)
WBC: 9.2 10*3/uL (ref 4.0–10.5)
nRBC: 0.3 % — ABNORMAL HIGH (ref 0.0–0.2)

## 2023-07-09 LAB — RESP PANEL BY RT-PCR (RSV, FLU A&B, COVID)  RVPGX2
Influenza A by PCR: NEGATIVE
Influenza B by PCR: NEGATIVE
Resp Syncytial Virus by PCR: NEGATIVE
SARS Coronavirus 2 by RT PCR: NEGATIVE

## 2023-07-09 LAB — LIPASE, BLOOD: Lipase: 23 U/L (ref 11–51)

## 2023-07-09 LAB — TSH: TSH: <0.01 u[IU]/mL — ABNORMAL LOW (ref 0.350–4.500)

## 2023-07-09 LAB — PROTIME-INR
INR: 1.4 — ABNORMAL HIGH (ref 0.8–1.2)
Prothrombin Time: 17.1 s — ABNORMAL HIGH (ref 11.4–15.2)

## 2023-07-09 LAB — D-DIMER, QUANTITATIVE: D-Dimer, Quant: 1.83 ug{FEU}/mL — ABNORMAL HIGH (ref 0.00–0.50)

## 2023-07-09 LAB — T4, FREE: Free T4: 2.15 ng/dL — ABNORMAL HIGH (ref 0.61–1.12)

## 2023-07-09 LAB — TROPONIN I (HIGH SENSITIVITY)
Troponin I (High Sensitivity): 64 ng/L — ABNORMAL HIGH (ref <18)
Troponin I (High Sensitivity): 56 ng/L — ABNORMAL HIGH (ref <18)

## 2023-07-09 LAB — BRAIN NATRIURETIC PEPTIDE: B Natriuretic Peptide: 703.7 pg/mL — ABNORMAL HIGH (ref 0.0–100.0)

## 2023-07-09 LAB — HEPARIN LEVEL (UNFRACTIONATED): Heparin Unfractionated: <0.1 [IU]/mL — ABNORMAL LOW (ref 0.30–0.70)

## 2023-07-09 LAB — APTT: aPTT: 36 s (ref 24–36)

## 2023-07-09 MED ORDER — SPIRONOLACTONE 25 MG PO TABS
25.0000 mg | ORAL_TABLET | Freq: Every day | ORAL | Status: DC
  Administered 2023-07-10: 25 mg via ORAL
  Filled 2023-07-09: qty 1

## 2023-07-09 MED ORDER — METHIMAZOLE 10 MG PO TABS
10.0000 mg | ORAL_TABLET | Freq: Three times a day (TID) | ORAL | Status: DC
  Administered 2023-07-10 – 2023-07-11 (×4): 10 mg via ORAL
  Filled 2023-07-09 (×6): qty 1

## 2023-07-09 MED ORDER — METOPROLOL TARTRATE 25 MG PO TABS
37.5000 mg | ORAL_TABLET | Freq: Two times a day (BID) | ORAL | Status: DC
  Administered 2023-07-09: 37.5 mg via ORAL
  Filled 2023-07-09 (×2): qty 2

## 2023-07-09 MED ORDER — IOHEXOL 350 MG/ML SOLN
75.0000 mL | Freq: Once | INTRAVENOUS | Status: AC | PRN
  Administered 2023-07-09: 75 mL via INTRAVENOUS

## 2023-07-09 MED ORDER — HEPARIN (PORCINE) 25000 UT/250ML-% IV SOLN
2000.0000 [IU]/h | INTRAVENOUS | Status: DC
  Administered 2023-07-09: 1800 [IU]/h via INTRAVENOUS
  Administered 2023-07-10: 2000 [IU]/h via INTRAVENOUS
  Filled 2023-07-09 (×2): qty 250

## 2023-07-09 MED ORDER — MORPHINE SULFATE (PF) 4 MG/ML IV SOLN
4.0000 mg | Freq: Once | INTRAVENOUS | Status: AC
  Administered 2023-07-09: 4 mg via INTRAVENOUS
  Filled 2023-07-09: qty 1

## 2023-07-09 MED ORDER — HEPARIN BOLUS VIA INFUSION
4000.0000 [IU] | Freq: Once | INTRAVENOUS | Status: AC
  Administered 2023-07-09: 4000 [IU] via INTRAVENOUS
  Filled 2023-07-09: qty 4000

## 2023-07-09 MED ORDER — METOPROLOL TARTRATE 5 MG/5ML IV SOLN
5.0000 mg | Freq: Once | INTRAVENOUS | Status: AC
  Administered 2023-07-09: 5 mg via INTRAVENOUS
  Filled 2023-07-09: qty 5

## 2023-07-09 MED ORDER — LACTATED RINGERS IV BOLUS
1000.0000 mL | Freq: Once | INTRAVENOUS | Status: AC
  Administered 2023-07-09: 1000 mL via INTRAVENOUS

## 2023-07-09 NOTE — ED Triage Notes (Signed)
 Left sided chest pressure for 4 days that radiates into back and left arm. Pt reports intermittent left arm numbness and tingling. C/o sob. Pt states last week he had cough, diarrhea and vomiting

## 2023-07-09 NOTE — H&P (Signed)
 History and Physical    Aaron [name] NWG:[id number] DOB: 15-Nov-1985 DOA: 07/09/2023  PCP: Patient, No Pcp Per  Patient coming from: Home  I have personally briefly reviewed patient's old medical records in [facility]  Chief Complaint: Left-sided chest pain, shortness of breath  HPI: Aaron [name] is a 38 y.o. male with medical history significant for PAF not adherent to Eliquis, chronic HFpEF, severe mitral regurgitation, hyperthyroidism who presented to the ED for evaluation of chest pain and dyspnea.  Patient reports 4 days of left-sided chest pain radiating straight to his back.  Pain is worse with deep inspiration.  He has had some radiation down his left arm with numbness/tingling sensation.  Last week he had a cough with some vomiting and diarrhea.  He noted experiencing pain in his left thigh area a few days ago.  He denies any recent travel.  Patient admits to not taking his medications as prescribed, he reports he did not like the way his Eliquis or thyroid medications made him feel.  ED Course  Labs/Imaging on admission: I have personally reviewed following labs and imaging studies.  Initial vitals showed BP 138/108, pulse 157, RR 18, temp 98.5 F, SpO2 100% on room air.  Labs showed WBC 9.2, hemoglobin 11.1, platelets 344,000, sodium 136, potassium 3.9, bicarb 20, BUN 11, creatinine 0.81, serum glucose 99, troponin 64 > 56.  BNP 703.7.  Lipase 23.  D-dimer 1.83.  TSH <0.010, free T42.15.  SARS-CoV-2, influenza, RSV PCR negative.  CTA chest showed segmental bilateral lower lobe pulmonary emboli.  No evidence for right heart strain.  Patchy groundglass opacities in the inferior left lower lobe worrisome for infarct.  New mediastinal and bilateral hilar lymphadenopathy which may be reactive but neoplasm is not excluded.  Patient was given 1 L LR, IV morphine 4 mg, IV metoprolol 5 mg and started on heparin drip.  EDP discussed with PCCM who felt patient was stable for  stepdown admission under hospitalist service.  The hospitalist service was consulted to admit.  Review of Systems: All systems reviewed and are negative except as documented in history of present illness above.   Past Medical History:  Diagnosis Date   Medical history non-contributory     Past Surgical History:  Procedure Laterality Date   LEFT HEART CATH AND CORONARY ANGIOGRAPHY N/A 10/22/2021   Procedure: LEFT HEART CATH AND CORONARY ANGIOGRAPHY;  Surgeon: Yvonne Kendall, MD;  Location: [facility] INVASIVE CV LAB;  Service: Cardiovascular;  Laterality: N/A;   NO PAST SURGERIES      Social History:  reports that he has never smoked. He has never used smokeless tobacco. He reports current drug use. Frequency: 3.00 times per week. Drug: Marijuana. He reports that he does not drink alcohol.  No Known Allergies  Family History  Problem Relation Age of Onset   Heart disease Other      Prior to Admission medications   Medication Sig Start Date End Date Taking? Authorizing Provider  bismuth subsalicylate (PEPTO BISMOL) 262 MG chewable tablet Chew 524 mg by mouth as needed for indigestion.   Yes [provider]  apixaban (ELIQUIS) 5 MG TABS tablet Take 1 tablet (5 mg total) by mouth 2 (two) times daily. Patient not taking: Reported on 07/09/2023 03/27/23   Christell Constant, MD  apixaban (ELIQUIS) 5 MG TABS tablet Take 1 tablet (5 mg total) by mouth 2 (two) times daily. Patient not taking: Reported on 07/09/2023 03/27/23   Christell Constant, MD  methimazole (TAPAZOLE) 10 MG tablet Take 1 tablet (10 mg total) by mouth every 8 (eight) hours. 03/22/23 06/20/23  Rhetta Mura, MD  metoprolol tartrate 37.5 MG TABS Take 1 tablet (37.5 mg total) by mouth 2 (two) times daily. 03/22/23 06/20/23  Rhetta Mura, MD  spironolactone (ALDACTONE) 25 MG tablet Take 1 tablet (25 mg total) by mouth daily. Patient not taking: Reported on 07/09/2023 03/27/23   Christell Constant,  MD    Physical Exam: Vitals:   07/09/23 2245 07/09/23 2300 07/09/23 2334 07/09/23 2353  BP: (!) 128/99 (!) 130/94 (!) 131/99 (!) 128/94  Pulse: (!) 146 (!) 145 (!) 146 (!) 145  Resp:   20   Temp:      TempSrc:      SpO2: 98% 93% 94%   Weight:      Height:       Constitutional: Resting in bed, appears fatigued but in NAD, calm, Eyes: EOMI, lids and conjunctivae normal ENMT: Mucous membranes are moist. Posterior pharynx clear of any exudate or lesions.Normal dentition.  Neck: normal, supple, no masses. Respiratory: clear to auscultation bilaterally, no wheezing, no crackles. Normal respiratory effort. No accessory muscle use.  Cardiovascular: Tachycardic, no murmurs / rubs / gallops.  Trace lower extremity edema. 2+ pedal pulses. Abdomen: no tenderness, no masses palpated.  Musculoskeletal: no clubbing / cyanosis. No joint deformity upper and lower extremities. Good ROM, no contractures. Normal muscle tone.  Skin: no rashes, lesions, ulcers. No induration Neurologic:  Sensation intact. Strength 5/5 in all 4.  Psychiatric: Normal judgment and insight. Alert and oriented x 3. Normal mood.   EKG: Personally reviewed. Sinus tachycardia rate 156.  Rate is faster when compared to prior.  Assessment/Plan Principal Problem:   Bilateral pulmonary embolism (HCC) Active Problems:   Paroxysmal atrial fibrillation (HCC)   Hyperthyroidism   Chronic heart failure with preserved ejection fraction (HFpEF) (HCC)   Severe mitral regurgitation   Aaron [name] is a 38 y.o. male with medical history significant for PAF not adherent to Eliquis, chronic HFpEF, severe mitral regurgitation, hyperthyroidism who is admitted with bilateral pulmonary emboli with left lower lobe pulmonary infarct.  Assessment and Plan: Bilateral lower lobe segmental PE with left lower lobe pulmonary infarct: Seen on CTA chest without evidence of right heart strain.  Prescribed Eliquis for A-fib but not adherent.  Currently  saturating well on room air. -Continue IV heparin -Obtain echocardiogram and LE DVT studies -Incentive spirometer, analgesics as needed  Paroxysmal atrial fibrillation: Sinus tachycardia on admission with rate 140s.  Tachycardia driven by PE and untreated hyperthyroidism due to medication nonadherence. -On IV heparin as above -Start back on Lopressor 37.5 mg po bid  Chronic HFpEF with severe mitral regurgitation: -Restart Lopressor 37.5 mg p.o. twice daily -Restart spironolactone 25 mg daily -Updating echocardiogram as above  Hyperthyroidism: Untreated due to medication nonadherence.  Contributing to tachycardia.  TSH <0.010, free T4 2.15. -Restart methimazole 10 mg 3 times daily -Restart Lopressor 37.5 mg twice daily   DVT prophylaxis: IV heparin Code Status: Full code Family Communication: Discussed with patient, he has discussed with family Disposition Plan: From home and likely discharge to home pending clinical progress Consults called: PCCM Severity of Illness: The appropriate patient status for this patient is INPATIENT. Inpatient status is judged to be reasonable and necessary in order to provide the required intensity of service to ensure the patient's safety. The patient's presenting symptoms, physical exam findings, and initial radiographic and laboratory data in the context of their  chronic comorbidities is felt to place them at high risk for further clinical deterioration. Furthermore, it is not anticipated that the patient will be medically stable for discharge from the hospital within 2 midnights of admission.   * I certify that at the point of admission it is my clinical judgment that the patient will require inpatient hospital care spanning beyond 2 midnights from the point of admission due to high intensity of service, high risk for further deterioration and high frequency of surveillance required.Darreld Mclean MD [facility]  If 7PM-7AM, please contact  night-coverage [email or web address]  07/10/2023, 12:46 AM

## 2023-07-09 NOTE — ED Provider Notes (Signed)
 [place] EMERGENCY DEPARTMENT AT [facility] Provider Note   CSN: [id number] Arrival date & time: 07/09/23  1827     History  Chief Complaint  Patient presents with   Chest Pain    Aaron [name] is a 38 y.o. male.  Patient is a 38 year old male with a past medical history of hyperthyroidism, A-fib, CAD presenting to the emergency department with chest pain.  The patient states that he was sick last week with nausea, vomiting and diarrhea.  He states that those symptoms have improved though he still been nauseous with a decreased appetite.  He states for the last 4 days he has had left-sided chest pain radiating down his left arm with some tingling in his left arm.  He states that he has pain with taking deep breaths.  He states that he has had a productive cough.  Denies any fever.  He states that he has not taken any of his medications including his Eliquis or thyroid medication in the last 2 to 3 months because "he did not like the way they made him feel".  He states he has not followed up with his doctor regarding this.  He states that he has had mild shortness of breath.  Denies any lower extremity swelling.  He denies any history of VTE, any recent hospitalization or surgery, any recent travel in the car or plane, hormone use or cancer history.  The history is provided by the patient.  Chest Pain      Home Medications Prior to Admission medications   Medication Sig Start Date End Date Taking? Authorizing Provider  bismuth subsalicylate (PEPTO BISMOL) 262 MG chewable tablet Chew 524 mg by mouth as needed for indigestion.   Yes [provider]  apixaban (ELIQUIS) 5 MG TABS tablet Take 1 tablet (5 mg total) by mouth 2 (two) times daily. Patient not taking: Reported on 07/09/2023 03/27/23   Christell Constant, MD  apixaban (ELIQUIS) 5 MG TABS tablet Take 1 tablet (5 mg total) by mouth 2 (two) times daily. Patient not taking: Reported on 07/09/2023 03/27/23    Christell Constant, MD  methimazole (TAPAZOLE) 10 MG tablet Take 1 tablet (10 mg total) by mouth every 8 (eight) hours. 03/22/23 06/20/23  Rhetta Mura, MD  metoprolol tartrate 37.5 MG TABS Take 1 tablet (37.5 mg total) by mouth 2 (two) times daily. 03/22/23 06/20/23  Rhetta Mura, MD  spironolactone (ALDACTONE) 25 MG tablet Take 1 tablet (25 mg total) by mouth daily. Patient not taking: Reported on 07/09/2023 03/27/23   Christell Constant, MD      Allergies    Patient has no known allergies.    Review of Systems   Review of Systems  Cardiovascular:  Positive for chest pain.    Physical Exam Updated Vital Signs BP (!) 135/97   Pulse (!) 144   Temp 99 F (37.2 C) (Oral)   Resp 20   Ht 6\' 4"  (1.93 m)   Wt 97.5 kg   SpO2 95%   BMI 26.17 kg/m  Physical Exam Vitals and nursing note reviewed.  Constitutional:      General: He is not in acute distress.    Appearance: He is well-developed. He is ill-appearing.  HENT:     Head: Normocephalic and atraumatic.  Eyes:     Extraocular Movements: Extraocular movements intact.  Cardiovascular:     Rate and Rhythm: Regular rhythm. Tachycardia present.     Pulses:  Radial pulses are 2+ on the right side and 2+ on the left side.     Heart sounds: Normal heart sounds.  Pulmonary:     Effort: Pulmonary effort is normal.     Breath sounds: Normal breath sounds.  Abdominal:     Palpations: Abdomen is soft.     Tenderness: There is no abdominal tenderness.  Musculoskeletal:        General: Normal range of motion.     Cervical back: Normal range of motion and neck supple.     Right lower leg: No edema.     Left lower leg: No edema.  Skin:    General: Skin is warm and dry.  Neurological:     General: No focal deficit present.     Mental Status: He is alert and oriented to person, place, and time.  Psychiatric:        Mood and Affect: Mood normal.        Behavior: Behavior normal.     ED Results /  Procedures / Treatments   Labs (all labs ordered are listed, but only abnormal results are displayed) Labs Reviewed  BASIC METABOLIC PANEL - Abnormal; Notable for the following components:      Result Value   CO2 20 (*)    Calcium 8.7 (*)    All other components within normal limits  CBC - Abnormal; Notable for the following components:   Hemoglobin 11.1 (*)    HCT 37.3 (*)    MCV 69.6 (*)    MCH 20.7 (*)    MCHC 29.8 (*)    RDW 15.7 (*)    nRBC 0.3 (*)    All other components within normal limits  PROTIME-INR - Abnormal; Notable for the following components:   Prothrombin Time 17.1 (*)    INR 1.4 (*)    All other components within normal limits  TSH - Abnormal; Notable for the following components:   TSH <0.010 (*)    All other components within normal limits  T4, FREE - Abnormal; Notable for the following components:   Free T4 2.15 (*)    All other components within normal limits  D-DIMER, QUANTITATIVE - Abnormal; Notable for the following components:   D-Dimer, Quant 1.83 (*)    All other components within normal limits  HEPATIC FUNCTION PANEL - Abnormal; Notable for the following components:   Total Bilirubin 1.7 (*)    Bilirubin, Direct 0.3 (*)    Indirect Bilirubin 1.4 (*)    All other components within normal limits  BRAIN NATRIURETIC PEPTIDE - Abnormal; Notable for the following components:   B Natriuretic Peptide 703.7 (*)    All other components within normal limits  TROPONIN I (HIGH SENSITIVITY) - Abnormal; Notable for the following components:   Troponin I (High Sensitivity) 64 (*)    All other components within normal limits  TROPONIN I (HIGH SENSITIVITY) - Abnormal; Notable for the following components:   Troponin I (High Sensitivity) 56 (*)    All other components within normal limits  RESP PANEL BY RT-PCR (RSV, FLU A&B, COVID)  RVPGX2  LIPASE, BLOOD  APTT  HEPARIN LEVEL (UNFRACTIONATED)  HEPARIN LEVEL (UNFRACTIONATED)  CBC    EKG EKG  Interpretation Date/Time:  Wednesday July 09 2023 21:16:05 EST Ventricular Rate:  146 PR Interval:  161 QRS Duration:  137 QT Interval:  338 QTC Calculation: 527 R Axis:   129  Text Interpretation: Ectopic atrial tachycardia, unifocal RBBB and LPFB Since last tracing  of earlier today No significant change was found Confirmed by Elayne Snare ([id number]) on 07/09/2023 10:52:40 PM  Radiology CT Angio Chest PE W/Cm &/Or Wo Cm Addendum Date: 07/09/2023 ADDENDUM REPORT: 07/09/2023 22:12 ADDENDUM: These results were called by telephone at the time of interpretation on 07/09/2023 at 10:12 pm to provider Elayne Snare , who verbally acknowledged these results. Electronically Signed   By: Darliss Cheney M.D.   On: 07/09/2023 22:12   Result Date: 07/09/2023 CLINICAL DATA:  Positive D-dimer with left-sided chest pressure. EXAM: CT ANGIOGRAPHY CHEST WITH CONTRAST TECHNIQUE: Multidetector CT imaging of the chest was performed using the standard protocol during bolus administration of intravenous contrast. Multiplanar CT image reconstructions and MIPs were obtained to evaluate the vascular anatomy. RADIATION DOSE REDUCTION: This exam was performed according to the departmental dose-optimization program which includes automated exposure control, adjustment of the mA and/or kV according to patient size and/or use of iterative reconstruction technique. CONTRAST:  75mL OMNIPAQUE IOHEXOL 350 MG/ML SOLN COMPARISON:  CT angiogram chest 03/30/2022. FINDINGS: Cardiovascular: Heart is mildly enlarged. There is no pericardial effusion. Aorta is normal in size. There segmental bilateral lower lobe pulmonary emboli. Mediastinum/Nodes: There is an enlarged left hilar lymph node measuring up to 18 mm short axis, new from prior. Enlarged right hilar lymph node is new measuring 13 mm. Enlarged subcarinal lymph node measures up to 2 cm short axis, new from prior. There are numerous new nonenlarged paratracheal lymph nodes.  Visualized esophagus and thyroid gland are within normal limits. Lungs/Pleura: There some patchy ground-glass opacities in the inferior left lower lobe. There is no pleural effusion or pneumothorax. Upper Abdomen: No acute abnormality. Musculoskeletal: No chest wall abnormality. No acute or significant osseous findings. Review of the MIP images confirms the above findings. IMPRESSION: 1. Segmental bilateral lower lobe pulmonary emboli. No evidence for right heart strain. 2. Patchy ground-glass opacities in the inferior left lower lobe worrisome for infarct. 3. New mediastinal and bilateral hilar lymphadenopathy. Findings may be reactive, but neoplasm is not excluded. Electronically Signed: By: Darliss Cheney M.D. On: 07/09/2023 22:06   DG Chest Port 1 View Result Date: 07/09/2023 CLINICAL DATA:  Left-sided chest pain for several days, initial encounter EXAM: PORTABLE CHEST 1 VIEW COMPARISON:  03/20/2023 FINDINGS: Cardiac shadow is mildly prominent but accentuated by the portable technique. The lungs are well aerated bilaterally. No focal infiltrate or effusion is noted. No acute bony abnormality is seen. IMPRESSION: No acute abnormality noted. Electronically Signed   By: Alcide Clever M.D.   On: 07/09/2023 19:48    Procedures .Critical Care  Performed by: Rexford Maus, DO Authorized by: Rexford Maus, DO   Critical care provider statement:    Critical care time (minutes):  30   Critical care was necessary to treat or prevent imminent or life-threatening deterioration of the following conditions:  Respiratory failure   Critical care was time spent personally by me on the following activities:  Development of treatment plan with patient or surrogate, discussions with consultants, evaluation of patient's response to treatment, examination of patient, ordering and review of laboratory studies, ordering and review of radiographic studies, ordering and performing treatments and interventions,  pulse oximetry, re-evaluation of patient's condition and review of old charts     Medications Ordered in ED Medications  heparin bolus via infusion 4,000 Units (has no administration in time range)    Followed by  heparin ADULT infusion 100 units/mL (25000 units/240mL) (has no administration in time range)  lactated ringers bolus  1,000 mL (0 mLs Intravenous Stopped 07/09/23 2112)  metoprolol tartrate (LOPRESSOR) injection 5 mg (5 mg Intravenous Given 07/09/23 2050)  morphine (PF) 4 MG/ML injection 4 mg (4 mg Intravenous Given 07/09/23 2113)  iohexol (OMNIPAQUE) 350 MG/ML injection 75 mL (75 mLs Intravenous Contrast Given 07/09/23 2140)    ED Course/ Medical Decision Making/ A&P Clinical Course as of 07/09/23 2256  Wed Jul 09, 2023  2041 No change in HR after IVF. Will try metoprolol to eval for possible atrial flutter. D-dimer is elevated, will need CTPE study. TSH undetectable, T4 pending. [VK]  2247 I received call from radiology with bilateral segmental PE and possible L pulmonary infarct, will be started on heparin. With significant tachycardia, will consult critical care for ICU vs SDU admission. [VK]  2252 I spoke with Dr. Allena Katz with critical care who reviewed imaging without clot burden needing intervention and no right heart strain on imaging is appropriate for SDU. Hospitalist will be consulted. [VK]    Clinical Course User Index [VK] Rexford Maus, DO                                 Medical Decision Making This patient presents to the ED with chief complaint(s) of CP with pertinent past medical history of A fib, hyperthyroidism, CAD which further complicates the presenting complaint. The complaint involves an extensive differential diagnosis and also carries with it a high risk of complications and morbidity.    The differential diagnosis includes ACS, arrhythmia, anemia, pneumonia, pneumothorax, pulmonary edema, pleural effusion, PE, considering dissection though less  likely due to the chronicity of the pain and patient being relatively stable, considering thyroid storm, viral syndrome, pericarditis, myocarditis  Additional history obtained: Additional history obtained from N/A Records reviewed outpatient cardiology records  ED Course and Reassessment: On patient's arrival he was significantly tachycardic in the 150s, otherwise hemodynamically stable in no acute distress.  EKG on arrival showed sinus tachycardia with rate related ST changes.  Patient was immediately brought back to a room.  Due to patient's medication noncompliance, concern for possible PE or thyroid storm in addition to ACS.  Patient will have labs including troponin, D-dimer and TSH.  He declined any pain control at this time.  Will be started on fluids and will be closely reassessed.  Independent labs interpretation:  The following labs were independently interpreted: mildly elevated troponin -> flat, mildly elevated BNP, elevated d-dimer, undetectable TSH with elevated T4 consistent with hyperthyroidism  Independent visualization of imaging: - I independently visualized the following imaging with scope of interpretation limited to determining acute life threatening conditions related to emergency care: CXR, CTPE, which revealed bilateral segmental PE, no right heart strain, possible L pulmonary infarct  Consultation: - Consulted or discussed management/test interpretation w/ external professional: critical care, hospitalist  Consideration for admission or further workup: patient requires admission for acute PE Social Determinants of health: N/A    Amount and/or Complexity of Data Reviewed Labs: ordered. Radiology: ordered.  Risk Prescription drug management. Decision regarding hospitalization.          Final Clinical Impression(s) / ED Diagnoses Final diagnoses:  Bilateral pulmonary embolism (HCC)  Hyperthyroidism  Tachycardia    Rx / DC Orders ED Discharge  Orders     None         Rexford Maus, DO 07/09/23 2256

## 2023-07-09 NOTE — Progress Notes (Signed)
 PHARMACY - ANTICOAGULATION CONSULT NOTE  Pharmacy Consult for Heparin Indication: pulmonary embolus  No Known Allergies  Patient Measurements: Height: 6\' 4"  (193 cm) Weight: 97.5 kg (215 lb) IBW/kg (Calculated) : 86.8 Heparin Dosing Weight: actual body weight  Vital Signs: Temp: 99 F (37.2 C) (02/26 2124) Temp Source: Oral (02/26 2124) BP: 135/97 (02/26 2215) Pulse Rate: 144 (02/26 2215)  Labs: Recent Labs    07/09/23 1851 07/09/23 2047  HGB 11.1*  --   HCT 37.3*  --   PLT 344  --   LABPROT 17.1*  --   INR 1.4*  --   CREATININE 0.81  --   TROPONINIHS 64* 56*    Estimated Creatinine Clearance: 151.8 mL/min (by C-G formula based on SCr of 0.81 mg/dL).   Medical History: Past Medical History:  Diagnosis Date   Medical history non-contributory     Medications:  No recent oral anticoagulation PTA (Patient had been on Eliquis 5mg  BID for AFib however has not taken medication in the past 2-3 months)  Assessment: 38 yr male with chest pain.  CTAngio + bilateral PE with no evidence of right heart strain PMH significant for hyperthyroidism, CAD, AFib (previously on Eliquis but noted patient stopped taking 2-3 months because he did not like the way it made him feel)  Goal of Therapy:  Heparin level 0.3-0.7 units/ml Monitor platelets by anticoagulation protocol: Yes   Plan:  Heparin 4000 unit IV bolus x 1 Heparin gtt @ 1800 units/hr Check heparin level 6 hr after infusion started Daily CBC & heparin level  Ioana Louks, Joselyn Glassman, PharmD 07/09/2023,10:47 PM

## 2023-07-10 ENCOUNTER — Inpatient Hospital Stay (HOSPITAL_COMMUNITY): Payer: Medicaid Other

## 2023-07-10 ENCOUNTER — Inpatient Hospital Stay (HOSPITAL_COMMUNITY): Payer: Medicaid Other | Admitting: Certified Registered Nurse Anesthetist

## 2023-07-10 ENCOUNTER — Inpatient Hospital Stay (HOSPITAL_COMMUNITY): Payer: Medicaid Other | Admitting: Certified Registered"

## 2023-07-10 ENCOUNTER — Inpatient Hospital Stay (HOSPITAL_COMMUNITY)
Admission: EM | Disposition: A | Discharge status: Rehabilitation Facility | Payer: Self-pay | Source: Home / Self Care | Attending: Cardiology

## 2023-07-10 ENCOUNTER — Telehealth (HOSPITAL_COMMUNITY): Payer: Self-pay | Admitting: Pharmacy Technician

## 2023-07-10 ENCOUNTER — Encounter (HOSPITAL_COMMUNITY): Payer: Medicaid Other

## 2023-07-10 ENCOUNTER — Other Ambulatory Visit (HOSPITAL_COMMUNITY): Payer: Medicaid Other

## 2023-07-10 ENCOUNTER — Encounter (HOSPITAL_COMMUNITY): Payer: Self-pay | Admitting: Internal Medicine

## 2023-07-10 ENCOUNTER — Encounter (HOSPITAL_COMMUNITY)
Admission: EM | Disposition: A | Discharge status: Rehabilitation Facility | Payer: Self-pay | Source: Home / Self Care | Attending: Cardiology

## 2023-07-10 ENCOUNTER — Other Ambulatory Visit (HOSPITAL_COMMUNITY): Payer: Self-pay

## 2023-07-10 DIAGNOSIS — J9601 Acute respiratory failure with hypoxia: Secondary | ICD-10-CM

## 2023-07-10 DIAGNOSIS — Z9911 Dependence on respirator [ventilator] status: Secondary | ICD-10-CM

## 2023-07-10 DIAGNOSIS — J90 Pleural effusion, not elsewhere classified: Secondary | ICD-10-CM | POA: Diagnosis not present

## 2023-07-10 DIAGNOSIS — I110 Hypertensive heart disease with heart failure: Secondary | ICD-10-CM

## 2023-07-10 DIAGNOSIS — E162 Hypoglycemia, unspecified: Secondary | ICD-10-CM | POA: Diagnosis not present

## 2023-07-10 DIAGNOSIS — I340 Nonrheumatic mitral (valve) insufficiency: Secondary | ICD-10-CM

## 2023-07-10 DIAGNOSIS — I5032 Chronic diastolic (congestive) heart failure: Secondary | ICD-10-CM

## 2023-07-10 DIAGNOSIS — D649 Anemia, unspecified: Secondary | ICD-10-CM

## 2023-07-10 DIAGNOSIS — I480 Paroxysmal atrial fibrillation: Secondary | ICD-10-CM | POA: Diagnosis not present

## 2023-07-10 DIAGNOSIS — I2699 Other pulmonary embolism without acute cor pulmonale: Secondary | ICD-10-CM | POA: Diagnosis not present

## 2023-07-10 DIAGNOSIS — N179 Acute kidney failure, unspecified: Secondary | ICD-10-CM

## 2023-07-10 DIAGNOSIS — E875 Hyperkalemia: Secondary | ICD-10-CM

## 2023-07-10 DIAGNOSIS — J189 Pneumonia, unspecified organism: Secondary | ICD-10-CM | POA: Diagnosis not present

## 2023-07-10 DIAGNOSIS — I517 Cardiomegaly: Secondary | ICD-10-CM | POA: Diagnosis not present

## 2023-07-10 DIAGNOSIS — I469 Cardiac arrest, cause unspecified: Secondary | ICD-10-CM | POA: Diagnosis not present

## 2023-07-10 DIAGNOSIS — I5043 Acute on chronic combined systolic (congestive) and diastolic (congestive) heart failure: Secondary | ICD-10-CM | POA: Diagnosis not present

## 2023-07-10 DIAGNOSIS — I4892 Unspecified atrial flutter: Secondary | ICD-10-CM | POA: Diagnosis not present

## 2023-07-10 DIAGNOSIS — I3139 Other pericardial effusion (noninflammatory): Secondary | ICD-10-CM | POA: Diagnosis not present

## 2023-07-10 DIAGNOSIS — T8111XA Postprocedural  cardiogenic shock, initial encounter: Secondary | ICD-10-CM

## 2023-07-10 DIAGNOSIS — I5021 Acute systolic (congestive) heart failure: Secondary | ICD-10-CM

## 2023-07-10 DIAGNOSIS — R570 Cardiogenic shock: Secondary | ICD-10-CM

## 2023-07-10 DIAGNOSIS — R918 Other nonspecific abnormal finding of lung field: Secondary | ICD-10-CM | POA: Diagnosis not present

## 2023-07-10 DIAGNOSIS — R569 Unspecified convulsions: Secondary | ICD-10-CM | POA: Diagnosis not present

## 2023-07-10 DIAGNOSIS — R188 Other ascites: Secondary | ICD-10-CM | POA: Diagnosis not present

## 2023-07-10 DIAGNOSIS — Z452 Encounter for adjustment and management of vascular access device: Secondary | ICD-10-CM | POA: Diagnosis not present

## 2023-07-10 DIAGNOSIS — Z4682 Encounter for fitting and adjustment of non-vascular catheter: Secondary | ICD-10-CM | POA: Diagnosis not present

## 2023-07-10 DIAGNOSIS — R590 Localized enlarged lymph nodes: Secondary | ICD-10-CM | POA: Diagnosis not present

## 2023-07-10 DIAGNOSIS — J9811 Atelectasis: Secondary | ICD-10-CM | POA: Diagnosis not present

## 2023-07-10 HISTORY — PX: ECMO CANNULATION: CATH118321

## 2023-07-10 HISTORY — DX: Cardiac arrest, cause unspecified: I46.9

## 2023-07-10 HISTORY — PX: VENTRICULAR ASSIST DEVICE INSERTION: CATH118273

## 2023-07-10 LAB — POCT I-STAT 7, (LYTES, BLD GAS, ICA,H+H)
Acid-Base Excess: 0 mmol/L (ref 0.0–2.0)
Acid-Base Excess: 0 mmol/L (ref 0.0–2.0)
Acid-Base Excess: 1 mmol/L (ref 0.0–2.0)
Acid-base deficit: 4 mmol/L — ABNORMAL HIGH (ref 0.0–2.0)
Acid-base deficit: 11 mmol/L — ABNORMAL HIGH (ref 0.0–2.0)
Acid-base deficit: 7 mmol/L — ABNORMAL HIGH (ref 0.0–2.0)
Acid-base deficit: 5 mmol/L — ABNORMAL HIGH (ref 0.0–2.0)
Acid-base deficit: 6 mmol/L — ABNORMAL HIGH (ref 0.0–2.0)
Acid-base deficit: 4 mmol/L — ABNORMAL HIGH (ref 0.0–2.0)
Acid-base deficit: 1 mmol/L (ref 0.0–2.0)
Acid-base deficit: 1 mmol/L (ref 0.0–2.0)
Bicarbonate: 22.3 mmol/L (ref 20.0–28.0)
Bicarbonate: 17.3 mmol/L — ABNORMAL LOW (ref 20.0–28.0)
Bicarbonate: 22.2 mmol/L (ref 20.0–28.0)
Bicarbonate: 17.6 mmol/L — ABNORMAL LOW (ref 20.0–28.0)
Bicarbonate: 19 mmol/L — ABNORMAL LOW (ref 20.0–28.0)
Bicarbonate: 18.8 mmol/L — ABNORMAL LOW (ref 20.0–28.0)
Bicarbonate: 21.2 mmol/L (ref 20.0–28.0)
Bicarbonate: 23.1 mmol/L (ref 20.0–28.0)
Bicarbonate: 24.7 mmol/L (ref 20.0–28.0)
Bicarbonate: 25.9 mmol/L (ref 20.0–28.0)
Bicarbonate: 24.7 mmol/L (ref 20.0–28.0)
Calcium, Ion: 1.36 mmol/L (ref 1.15–1.40)
Calcium, Ion: 1.47 mmol/L — ABNORMAL HIGH (ref 1.15–1.40)
Calcium, Ion: 1.6 mmol/L (ref 1.15–1.40)
Calcium, Ion: 1.3 mmol/L (ref 1.15–1.40)
Calcium, Ion: 1.44 mmol/L — ABNORMAL HIGH (ref 1.15–1.40)
Calcium, Ion: 1.34 mmol/L (ref 1.15–1.40)
Calcium, Ion: 1.31 mmol/L (ref 1.15–1.40)
Calcium, Ion: 1.26 mmol/L (ref 1.15–1.40)
Calcium, Ion: 1.26 mmol/L (ref 1.15–1.40)
Calcium, Ion: 1.22 mmol/L (ref 1.15–1.40)
Calcium, Ion: 1.14 mmol/L — ABNORMAL LOW (ref 1.15–1.40)
HCT: 33 % — ABNORMAL LOW (ref 39.0–52.0)
HCT: 33 % — ABNORMAL LOW (ref 39.0–52.0)
HCT: 35 % — ABNORMAL LOW (ref 39.0–52.0)
HCT: 28 % — ABNORMAL LOW (ref 39.0–52.0)
HCT: 29 % — ABNORMAL LOW (ref 39.0–52.0)
HCT: 30 % — ABNORMAL LOW (ref 39.0–52.0)
HCT: 31 % — ABNORMAL LOW (ref 39.0–52.0)
HCT: 30 % — ABNORMAL LOW (ref 39.0–52.0)
HCT: 31 % — ABNORMAL LOW (ref 39.0–52.0)
HCT: 31 % — ABNORMAL LOW (ref 39.0–52.0)
HCT: 29 % — ABNORMAL LOW (ref 39.0–52.0)
Hemoglobin: 11.2 g/dL — ABNORMAL LOW (ref 13.0–17.0)
Hemoglobin: 11.2 g/dL — ABNORMAL LOW (ref 13.0–17.0)
Hemoglobin: 11.9 g/dL — ABNORMAL LOW (ref 13.0–17.0)
Hemoglobin: 9.5 g/dL — ABNORMAL LOW (ref 13.0–17.0)
Hemoglobin: 9.9 g/dL — ABNORMAL LOW (ref 13.0–17.0)
Hemoglobin: 10.2 g/dL — ABNORMAL LOW (ref 13.0–17.0)
Hemoglobin: 10.5 g/dL — ABNORMAL LOW (ref 13.0–17.0)
Hemoglobin: 10.2 g/dL — ABNORMAL LOW (ref 13.0–17.0)
Hemoglobin: 10.5 g/dL — ABNORMAL LOW (ref 13.0–17.0)
Hemoglobin: 10.5 g/dL — ABNORMAL LOW (ref 13.0–17.0)
Hemoglobin: 9.9 g/dL — ABNORMAL LOW (ref 13.0–17.0)
O2 Saturation: 100 %
O2 Saturation: 100 %
O2 Saturation: 99 %
O2 Saturation: 100 %
O2 Saturation: 100 %
O2 Saturation: 100 %
O2 Saturation: 100 %
O2 Saturation: 100 %
O2 Saturation: 95 %
O2 Saturation: 99 %
O2 Saturation: 100 %
Patient temperature: 90
Patient temperature: 34.9
Patient temperature: 35.6
Patient temperature: 36
Patient temperature: 36.4
Patient temperature: 36.7
Patient temperature: 36.9
Patient temperature: 36.7
Potassium: 7.6 mmol/L (ref 3.5–5.1)
Potassium: 6 mmol/L — ABNORMAL HIGH (ref 3.5–5.1)
Potassium: 6.5 mmol/L (ref 3.5–5.1)
Potassium: 5.1 mmol/L (ref 3.5–5.1)
Potassium: 5.9 mmol/L — ABNORMAL HIGH (ref 3.5–5.1)
Potassium: 4.5 mmol/L (ref 3.5–5.1)
Potassium: 4.4 mmol/L (ref 3.5–5.1)
Potassium: 4.7 mmol/L (ref 3.5–5.1)
Potassium: 4.9 mmol/L (ref 3.5–5.1)
Potassium: 5.8 mmol/L — ABNORMAL HIGH (ref 3.5–5.1)
Potassium: 6.5 mmol/L (ref 3.5–5.1)
Sodium: 141 mmol/L (ref 135–145)
Sodium: 137 mmol/L (ref 135–145)
Sodium: 140 mmol/L (ref 135–145)
Sodium: 139 mmol/L (ref 135–145)
Sodium: 140 mmol/L (ref 135–145)
Sodium: 142 mmol/L (ref 135–145)
Sodium: 142 mmol/L (ref 135–145)
Sodium: 144 mmol/L (ref 135–145)
Sodium: 142 mmol/L (ref 135–145)
Sodium: 141 mmol/L (ref 135–145)
Sodium: 140 mmol/L (ref 135–145)
TCO2: 24 mmol/L (ref 22–32)
TCO2: 19 mmol/L — ABNORMAL LOW (ref 22–32)
TCO2: 24 mmol/L (ref 22–32)
TCO2: 18 mmol/L — ABNORMAL LOW (ref 22–32)
TCO2: 20 mmol/L — ABNORMAL LOW (ref 22–32)
TCO2: 20 mmol/L — ABNORMAL LOW (ref 22–32)
TCO2: 22 mmol/L (ref 22–32)
TCO2: 24 mmol/L (ref 22–32)
TCO2: 26 mmol/L (ref 22–32)
TCO2: 27 mmol/L (ref 22–32)
TCO2: 26 mmol/L (ref 22–32)
pCO2 arterial: 35.3 mmHg (ref 32–48)
pCO2 arterial: 48 mmHg (ref 32–48)
pCO2 arterial: 62.6 mmHg — ABNORMAL HIGH (ref 32–48)
pCO2 arterial: 21.5 mmHg — ABNORMAL LOW (ref 32–48)
pCO2 arterial: 33.3 mmHg (ref 32–48)
pCO2 arterial: 24.3 mmHg — ABNORMAL LOW (ref 32–48)
pCO2 arterial: 25.8 mmHg — ABNORMAL LOW (ref 32–48)
pCO2 arterial: 33.9 mmHg (ref 32–48)
pCO2 arterial: 39.1 mmHg (ref 32–48)
pCO2 arterial: 44.1 mmHg (ref 32–48)
pCO2 arterial: 35.2 mmHg (ref 32–48)
pH, Arterial: 7.385 (ref 7.35–7.45)
pH, Arterial: 7.158 — CL (ref 7.35–7.45)
pH, Arterial: 7.165 — CL (ref 7.35–7.45)
pH, Arterial: 7.364 (ref 7.35–7.45)
pH, Arterial: 7.511 — ABNORMAL HIGH (ref 7.35–7.45)
pH, Arterial: 7.49 — ABNORMAL HIGH (ref 7.35–7.45)
pH, Arterial: 7.52 — ABNORMAL HIGH (ref 7.35–7.45)
pH, Arterial: 7.439 (ref 7.35–7.45)
pH, Arterial: 7.407 (ref 7.35–7.45)
pH, Arterial: 7.376 (ref 7.35–7.45)
pH, Arterial: 7.453 — ABNORMAL HIGH (ref 7.35–7.45)
pO2, Arterial: 215 mmHg — ABNORMAL HIGH (ref 83–108)
pO2, Arterial: 157 mmHg — ABNORMAL HIGH (ref 83–108)
pO2, Arterial: 312 mmHg — ABNORMAL HIGH (ref 83–108)
pO2, Arterial: 191 mmHg — ABNORMAL HIGH (ref 83–108)
pO2, Arterial: 204 mmHg — ABNORMAL HIGH (ref 83–108)
pO2, Arterial: 455 mmHg — ABNORMAL HIGH (ref 83–108)
pO2, Arterial: 263 mmHg — ABNORMAL HIGH (ref 83–108)
pO2, Arterial: 260 mmHg — ABNORMAL HIGH (ref 83–108)
pO2, Arterial: 74 mmHg — ABNORMAL LOW (ref 83–108)
pO2, Arterial: 155 mmHg — ABNORMAL HIGH (ref 83–108)
pO2, Arterial: 169 mmHg — ABNORMAL HIGH (ref 83–108)

## 2023-07-10 LAB — BLOOD GAS, ARTERIAL
Acid-base deficit: 13.4 mmol/L — ABNORMAL HIGH (ref 0.0–2.0)
Bicarbonate: 15.9 mmol/L — ABNORMAL LOW (ref 20.0–28.0)
Drawn by: 59350
FIO2: 100 %
MECHVT: 610 mL
O2 Saturation: 99.5 %
PEEP: 5 cmH2O
Patient temperature: 37
RATE: 18 {breaths}/min
pCO2 arterial: 49 mmHg — ABNORMAL HIGH (ref 32–48)
pH, Arterial: 7.12 — CL (ref 7.35–7.45)
pO2, Arterial: 157 mmHg — ABNORMAL HIGH (ref 83–108)

## 2023-07-10 LAB — POCT I-STAT, CHEM 8
BUN: 19 mg/dL (ref 6–20)
BUN: 21 mg/dL — ABNORMAL HIGH (ref 6–20)
Calcium, Ion: 1.48 mmol/L — ABNORMAL HIGH (ref 1.15–1.40)
Calcium, Ion: 1.4 mmol/L (ref 1.15–1.40)
Chloride: 107 mmol/L (ref 98–111)
Chloride: 105 mmol/L (ref 98–111)
Creatinine, Ser: 1.7 mg/dL — ABNORMAL HIGH (ref 0.61–1.24)
Creatinine, Ser: 1.8 mg/dL — ABNORMAL HIGH (ref 0.61–1.24)
Glucose, Bld: 114 mg/dL — ABNORMAL HIGH (ref 70–99)
Glucose, Bld: 119 mg/dL — ABNORMAL HIGH (ref 70–99)
HCT: 36 % — ABNORMAL LOW (ref 39.0–52.0)
HCT: 29 % — ABNORMAL LOW (ref 39.0–52.0)
Hemoglobin: 12.2 g/dL — ABNORMAL LOW (ref 13.0–17.0)
Hemoglobin: 9.9 g/dL — ABNORMAL LOW (ref 13.0–17.0)
Potassium: 6.5 mmol/L (ref 3.5–5.1)
Potassium: 5.3 mmol/L — ABNORMAL HIGH (ref 3.5–5.1)
Sodium: 138 mmol/L (ref 135–145)
Sodium: 140 mmol/L (ref 135–145)
TCO2: 20 mmol/L — ABNORMAL LOW (ref 22–32)
TCO2: 19 mmol/L — ABNORMAL LOW (ref 22–32)

## 2023-07-10 LAB — ECHO TEE
Est EF: <20
Height: 76 in
Weight: 3440 [oz_av]

## 2023-07-10 LAB — CBC
HCT: 42.5 % (ref 39.0–52.0)
HCT: 37 % — ABNORMAL LOW (ref 39.0–52.0)
HCT: 33.7 % — ABNORMAL LOW (ref 39.0–52.0)
HCT: 31.3 % — ABNORMAL LOW (ref 39.0–52.0)
HCT: 30.1 % — ABNORMAL LOW (ref 39.0–52.0)
Hemoglobin: 12 g/dL — ABNORMAL LOW (ref 13.0–17.0)
Hemoglobin: 10.6 g/dL — ABNORMAL LOW (ref 13.0–17.0)
Hemoglobin: 10.1 g/dL — ABNORMAL LOW (ref 13.0–17.0)
Hemoglobin: 10 g/dL — ABNORMAL LOW (ref 13.0–17.0)
Hemoglobin: 9.6 g/dL — ABNORMAL LOW (ref 13.0–17.0)
MCH: 20.8 pg — ABNORMAL LOW (ref 26.0–34.0)
MCH: 20.9 pg — ABNORMAL LOW (ref 26.0–34.0)
MCH: 21.3 pg — ABNORMAL LOW (ref 26.0–34.0)
MCH: 22.2 pg — ABNORMAL LOW (ref 26.0–34.0)
MCH: 22.2 pg — ABNORMAL LOW (ref 26.0–34.0)
MCHC: 28.2 g/dL — ABNORMAL LOW (ref 30.0–36.0)
MCHC: 28.6 g/dL — ABNORMAL LOW (ref 30.0–36.0)
MCHC: 30 g/dL (ref 30.0–36.0)
MCHC: 31.9 g/dL (ref 30.0–36.0)
MCHC: 31.9 g/dL (ref 30.0–36.0)
MCV: 73.7 fL — ABNORMAL LOW (ref 80.0–100.0)
MCV: 72.8 fL — ABNORMAL LOW (ref 80.0–100.0)
MCV: 70.9 fL — ABNORMAL LOW (ref 80.0–100.0)
MCV: 69.6 fL — ABNORMAL LOW (ref 80.0–100.0)
MCV: 69.7 fL — ABNORMAL LOW (ref 80.0–100.0)
Platelets: 421 10*3/uL — ABNORMAL HIGH (ref 150–400)
Platelets: 269 10*3/uL (ref 150–400)
Platelets: 199 10*3/uL (ref 150–400)
Platelets: 199 10*3/uL (ref 150–400)
Platelets: 210 10*3/uL (ref 150–400)
RBC: 5.77 MIL/uL (ref 4.22–5.81)
RBC: 5.08 MIL/uL (ref 4.22–5.81)
RBC: 4.75 MIL/uL (ref 4.22–5.81)
RBC: 4.5 MIL/uL (ref 4.22–5.81)
RBC: 4.32 MIL/uL (ref 4.22–5.81)
RDW: 16 % — ABNORMAL HIGH (ref 11.5–15.5)
RDW: 15.9 % — ABNORMAL HIGH (ref 11.5–15.5)
RDW: 15.9 % — ABNORMAL HIGH (ref 11.5–15.5)
RDW: 16.9 % — ABNORMAL HIGH (ref 11.5–15.5)
RDW: 16.7 % — ABNORMAL HIGH (ref 11.5–15.5)
WBC: 14.7 10*3/uL — ABNORMAL HIGH (ref 4.0–10.5)
WBC: 14.3 10*3/uL — ABNORMAL HIGH (ref 4.0–10.5)
WBC: 23.2 10*3/uL — ABNORMAL HIGH (ref 4.0–10.5)
WBC: 28 10*3/uL — ABNORMAL HIGH (ref 4.0–10.5)
WBC: 32.8 10*3/uL — ABNORMAL HIGH (ref 4.0–10.5)
nRBC: 0.5 % — ABNORMAL HIGH (ref 0.0–0.2)
nRBC: 2.2 % — ABNORMAL HIGH (ref 0.0–0.2)
nRBC: 1.7 % — ABNORMAL HIGH (ref 0.0–0.2)
nRBC: 1.1 % — ABNORMAL HIGH (ref 0.0–0.2)
nRBC: 0.6 % — ABNORMAL HIGH (ref 0.0–0.2)

## 2023-07-10 LAB — PHOSPHORUS: Phosphorus: 8.3 mg/dL — ABNORMAL HIGH (ref 2.5–4.6)

## 2023-07-10 LAB — COMPREHENSIVE METABOLIC PANEL
ALT: 95 U/L — ABNORMAL HIGH (ref 0–44)
ALT: 121 U/L — ABNORMAL HIGH (ref 0–44)
AST: 146 U/L — ABNORMAL HIGH (ref 15–41)
AST: 212 U/L — ABNORMAL HIGH (ref 15–41)
Albumin: 3.1 g/dL — ABNORMAL LOW (ref 3.5–5.0)
Albumin: 2.5 g/dL — ABNORMAL LOW (ref 3.5–5.0)
Alkaline Phosphatase: 86 U/L (ref 38–126)
Alkaline Phosphatase: 70 U/L (ref 38–126)
Anion gap: 16 — ABNORMAL HIGH (ref 5–15)
Anion gap: 18 — ABNORMAL HIGH (ref 5–15)
BUN: 21 mg/dL — ABNORMAL HIGH (ref 6–20)
BUN: 20 mg/dL (ref 6–20)
CO2: 22 mmol/L (ref 22–32)
CO2: 17 mmol/L — ABNORMAL LOW (ref 22–32)
Calcium: 14.8 mg/dL (ref 8.9–10.3)
Calcium: 11.4 mg/dL — ABNORMAL HIGH (ref 8.9–10.3)
Chloride: 108 mmol/L (ref 98–111)
Chloride: 101 mmol/L (ref 98–111)
Creatinine, Ser: 1.53 mg/dL — ABNORMAL HIGH (ref 0.61–1.24)
Creatinine, Ser: 1.88 mg/dL — ABNORMAL HIGH (ref 0.61–1.24)
GFR, Estimated: 59 mL/min — ABNORMAL LOW (ref >60)
GFR, Estimated: 46 mL/min — ABNORMAL LOW (ref >60)
Glucose, Bld: 49 mg/dL — ABNORMAL LOW (ref 70–99)
Glucose, Bld: 265 mg/dL — ABNORMAL HIGH (ref 70–99)
Potassium: 7.2 mmol/L (ref 3.5–5.1)
Potassium: 6.2 mmol/L — ABNORMAL HIGH (ref 3.5–5.1)
Sodium: 146 mmol/L — ABNORMAL HIGH (ref 135–145)
Sodium: 136 mmol/L (ref 135–145)
Total Bilirubin: 2.7 mg/dL — ABNORMAL HIGH (ref 0.0–1.2)
Total Bilirubin: 2.6 mg/dL — ABNORMAL HIGH (ref 0.0–1.2)
Total Protein: 6.2 g/dL — ABNORMAL LOW (ref 6.5–8.1)
Total Protein: 5.2 g/dL — ABNORMAL LOW (ref 6.5–8.1)

## 2023-07-10 LAB — BASIC METABOLIC PANEL
Anion gap: 13 (ref 5–15)
Anion gap: 18 — ABNORMAL HIGH (ref 5–15)
BUN: 17 mg/dL (ref 6–20)
BUN: 21 mg/dL — ABNORMAL HIGH (ref 6–20)
CO2: 16 mmol/L — ABNORMAL LOW (ref 22–32)
CO2: 18 mmol/L — ABNORMAL LOW (ref 22–32)
Calcium: 8.4 mg/dL — ABNORMAL LOW (ref 8.9–10.3)
Calcium: 9.9 mg/dL (ref 8.9–10.3)
Chloride: 101 mmol/L (ref 98–111)
Chloride: 104 mmol/L (ref 98–111)
Creatinine, Ser: 0.93 mg/dL (ref 0.61–1.24)
Creatinine, Ser: 1.7 mg/dL — ABNORMAL HIGH (ref 0.61–1.24)
GFR, Estimated: >60 mL/min (ref >60)
GFR, Estimated: 52 mL/min — ABNORMAL LOW (ref >60)
Glucose, Bld: 165 mg/dL — ABNORMAL HIGH (ref 70–99)
Glucose, Bld: 56 mg/dL — ABNORMAL LOW (ref 70–99)
Potassium: 5.4 mmol/L — ABNORMAL HIGH (ref 3.5–5.1)
Potassium: 4.4 mmol/L (ref 3.5–5.1)
Sodium: 130 mmol/L — ABNORMAL LOW (ref 135–145)
Sodium: 140 mmol/L (ref 135–145)

## 2023-07-10 LAB — TROPONIN I (HIGH SENSITIVITY)
Troponin I (High Sensitivity): 263 ng/L (ref <18)
Troponin I (High Sensitivity): 597 ng/L (ref <18)

## 2023-07-10 LAB — POCT ACTIVATED CLOTTING TIME
Activated Clotting Time: 141 s
Activated Clotting Time: 164 s
Activated Clotting Time: 227 s
Activated Clotting Time: 181 s

## 2023-07-10 LAB — SODIUM, URINE, RANDOM: Sodium, Ur: 44 mmol/L

## 2023-07-10 LAB — FIBRINOGEN
Fibrinogen: <60 mg/dL — CL (ref 210–475)
Fibrinogen: <60 mg/dL — CL (ref 210–475)

## 2023-07-10 LAB — HEPATIC FUNCTION PANEL
ALT: 285 U/L — ABNORMAL HIGH (ref 0–44)
AST: 575 U/L — ABNORMAL HIGH (ref 15–41)
Albumin: 2.6 g/dL — ABNORMAL LOW (ref 3.5–5.0)
Alkaline Phosphatase: 73 U/L (ref 38–126)
Bilirubin, Direct: 0.9 mg/dL — ABNORMAL HIGH (ref 0.0–0.2)
Indirect Bilirubin: 2.1 mg/dL — ABNORMAL HIGH (ref 0.3–0.9)
Total Bilirubin: 3 mg/dL — ABNORMAL HIGH (ref 0.0–1.2)
Total Protein: 5.2 g/dL — ABNORMAL LOW (ref 6.5–8.1)

## 2023-07-10 LAB — OSMOLALITY: Osmolality: 306 mosm/kg — ABNORMAL HIGH (ref 275–295)

## 2023-07-10 LAB — PROTIME-INR
INR: 3.1 — ABNORMAL HIGH (ref 0.8–1.2)
INR: 4.4 (ref 0.8–1.2)
INR: 3.6 — ABNORMAL HIGH (ref 0.8–1.2)
Prothrombin Time: 31.8 s — ABNORMAL HIGH (ref 11.4–15.2)
Prothrombin Time: 42 s — ABNORMAL HIGH (ref 11.4–15.2)
Prothrombin Time: 36 s — ABNORMAL HIGH (ref 11.4–15.2)

## 2023-07-10 LAB — GLUCOSE, CAPILLARY
Glucose-Capillary: 133 mg/dL — ABNORMAL HIGH (ref 70–99)
Glucose-Capillary: 62 mg/dL — ABNORMAL LOW (ref 70–99)
Glucose-Capillary: 67 mg/dL — ABNORMAL LOW (ref 70–99)
Glucose-Capillary: 127 mg/dL — ABNORMAL HIGH (ref 70–99)
Glucose-Capillary: 120 mg/dL — ABNORMAL HIGH (ref 70–99)
Glucose-Capillary: 135 mg/dL — ABNORMAL HIGH (ref 70–99)
Glucose-Capillary: 131 mg/dL — ABNORMAL HIGH (ref 70–99)
Glucose-Capillary: 166 mg/dL — ABNORMAL HIGH (ref 70–99)

## 2023-07-10 LAB — CG4 I-STAT (LACTIC ACID)
Lactic Acid, Venous: 8.8 mmol/L (ref 0.5–1.9)
Lactic Acid, Venous: 7.5 mmol/L (ref 0.5–1.9)
Lactic Acid, Venous: 4.9 mmol/L (ref 0.5–1.9)
Lactic Acid, Venous: 3.7 mmol/L (ref 0.5–1.9)

## 2023-07-10 LAB — HEPARIN LEVEL (UNFRACTIONATED)
Heparin Unfractionated: 0.23 [IU]/mL — ABNORMAL LOW (ref 0.30–0.70)
Heparin Unfractionated: <0.1 [IU]/mL — ABNORMAL LOW (ref 0.30–0.70)
Heparin Unfractionated: 0.23 [IU]/mL — ABNORMAL LOW (ref 0.30–0.70)
Heparin Unfractionated: <0.1 [IU]/mL — ABNORMAL LOW (ref 0.30–0.70)

## 2023-07-10 LAB — LACTATE DEHYDROGENASE: LDH: 1440 U/L — ABNORMAL HIGH (ref 98–192)

## 2023-07-10 LAB — APTT
aPTT: 134 s — ABNORMAL HIGH (ref 24–36)
aPTT: >200 s (ref 24–36)
aPTT: 76 s — ABNORMAL HIGH (ref 24–36)
aPTT: 50 s — ABNORMAL HIGH (ref 24–36)

## 2023-07-10 LAB — PREPARE RBC (CROSSMATCH)

## 2023-07-10 LAB — HEMOGLOBIN A1C
Hgb A1c MFr Bld: 4.8 % (ref 4.8–5.6)
Mean Plasma Glucose: 91.06 mg/dL

## 2023-07-10 LAB — BRAIN NATRIURETIC PEPTIDE: B Natriuretic Peptide: 980.5 pg/mL — ABNORMAL HIGH (ref 0.0–100.0)

## 2023-07-10 LAB — MRSA NEXT GEN BY PCR, NASAL: MRSA by PCR Next Gen: NOT DETECTED

## 2023-07-10 LAB — MAGNESIUM: Magnesium: 3.3 mg/dL — ABNORMAL HIGH (ref 1.7–2.4)

## 2023-07-10 LAB — OSMOLALITY, URINE: Osmolality, Ur: 990 mosm/kg — ABNORMAL HIGH (ref 300–900)

## 2023-07-10 SURGERY — ECMO CANNULATION
Anesthesia: General

## 2023-07-10 SURGERY — ECMO CANNULATION
Anesthesia: LOCAL

## 2023-07-10 MED ORDER — DILTIAZEM HCL-DEXTROSE 125-5 MG/125ML-% IV SOLN (PREMIX)
5.0000 mg/h | INTRAVENOUS | Status: DC
  Administered 2023-07-10: 5 mg/h via INTRAVENOUS
  Filled 2023-07-10: qty 125

## 2023-07-10 MED ORDER — METOPROLOL TARTRATE 50 MG PO TABS: 150.0000 mg | ORAL_TABLET | Freq: Two times a day (BID) | ORAL | Status: DC

## 2023-07-10 MED ORDER — VASOPRESSIN 20 UNITS/100 ML INFUSION FOR SHOCK
INTRAVENOUS | Status: AC
  Filled 2023-07-10: qty 100

## 2023-07-10 MED ORDER — INSULIN REGULAR(HUMAN) IN NACL 100-0.9 UT/100ML-% IV SOLN: INTRAVENOUS | Status: DC | PRN

## 2023-07-10 MED ORDER — DEXTROSE IN LACTATED RINGERS 5 % IV SOLN: INTRAVENOUS | Status: DC

## 2023-07-10 MED ORDER — SODIUM CHLORIDE 0.9 % IV SOLN: INTRAVENOUS | Status: AC | PRN

## 2023-07-10 MED ORDER — ALBUMIN HUMAN 5 % IV SOLN
12.5000 g | INTRAVENOUS | Status: DC | PRN
  Administered 2023-07-14 – 2023-07-18 (×8): 12.5 g via INTRAVENOUS
  Filled 2023-07-10 (×5): qty 250

## 2023-07-10 MED ORDER — CHLORHEXIDINE GLUCONATE CLOTH 2 % EX PADS
6.0000 | MEDICATED_PAD | Freq: Every day | CUTANEOUS | Status: DC
  Administered 2023-07-11 – 2023-07-27 (×16): 6 via TOPICAL

## 2023-07-10 MED ORDER — SODIUM ZIRCONIUM CYCLOSILICATE 10 G PO PACK
10.0000 g | PACK | Freq: Every day | ORAL | Status: DC
  Administered 2023-07-10: 10 g via ORAL
  Filled 2023-07-10: qty 1

## 2023-07-10 MED ORDER — ROCURONIUM BROMIDE 10 MG/ML (PF) SYRINGE
PREFILLED_SYRINGE | INTRAVENOUS | Status: AC
  Filled 2023-07-10: qty 10

## 2023-07-10 MED ORDER — LACTATED RINGERS IV SOLN: INTRAVENOUS | Status: DC | PRN

## 2023-07-10 MED ORDER — IOHEXOL 350 MG/ML SOLN
INTRAVENOUS | Status: DC | PRN
  Administered 2023-07-10: 10 mL

## 2023-07-10 MED ORDER — INSULIN ASPART 100 UNIT/ML IJ SOLN
0.0000 [IU] | INTRAMUSCULAR | Status: DC
  Administered 2023-07-11 – 2023-07-12 (×3): 1 [IU] via SUBCUTANEOUS

## 2023-07-10 MED ORDER — CALCIUM CHLORIDE 10 % IV SOLN
INTRAVENOUS | Status: AC
  Filled 2023-07-10: qty 10

## 2023-07-10 MED ORDER — ACETAMINOPHEN 325 MG PO TABS: 650.0000 mg | ORAL_TABLET | Freq: Four times a day (QID) | ORAL | Status: DC | PRN

## 2023-07-10 MED ORDER — ADULT MULTIVITAMIN W/MINERALS CH
1.0000 | ORAL_TABLET | Freq: Every day | ORAL | Status: DC
  Administered 2023-07-11: 1
  Filled 2023-07-10: qty 1

## 2023-07-10 MED ORDER — PROPOFOL 1000 MG/100ML IV EMUL: 0.0000 ug/kg/min | INTRAVENOUS | Status: DC

## 2023-07-10 MED ORDER — DILTIAZEM LOAD VIA INFUSION
15.0000 mg | Freq: Once | INTRAVENOUS | Status: AC
  Administered 2023-07-10: 15 mg via INTRAVENOUS
  Filled 2023-07-10: qty 15

## 2023-07-10 MED ORDER — LACTATED RINGERS IV SOLN: INTRAVENOUS | Status: DC

## 2023-07-10 MED ORDER — NOREPINEPHRINE 4 MG/250ML-% IV SOLN
0.0000 ug/min | INTRAVENOUS | Status: DC
  Administered 2023-07-10: 25 ug/min via INTRAVENOUS
  Filled 2023-07-10 (×2): qty 250

## 2023-07-10 MED ORDER — EPINEPHRINE HCL 5 MG/250ML IV SOLN IN NS
INTRAVENOUS | Status: DC | PRN
  Administered 2023-07-10: 20 ug/min via INTRAVENOUS

## 2023-07-10 MED ORDER — SODIUM CHLORIDE 0.9% IV SOLUTION
Freq: Once | INTRAVENOUS | Status: DC
Start: 2023-07-10 — End: 2023-07-12

## 2023-07-10 MED ORDER — LORAZEPAM 1 MG PO TABS: 1.0000 mg | ORAL_TABLET | ORAL | Status: DC | PRN

## 2023-07-10 MED ORDER — NICARDIPINE HCL IN NACL 20-0.86 MG/200ML-% IV SOLN
3.0000 mg/h | INTRAVENOUS | Status: DC
  Administered 2023-07-10: 3 mg/h via INTRAVENOUS
  Filled 2023-07-10: qty 200

## 2023-07-10 MED ORDER — ROCURONIUM BROMIDE 10 MG/ML (PF) SYRINGE
PREFILLED_SYRINGE | INTRAVENOUS | Status: DC | PRN
  Administered 2023-07-10: 100 mg via INTRAVENOUS
  Administered 2023-07-10: 30 mg via INTRAVENOUS
  Administered 2023-07-10: 50 mg via INTRAVENOUS

## 2023-07-10 MED ORDER — DILTIAZEM HCL 25 MG/5ML IV SOLN
20.0000 mg | Freq: Once | INTRAVENOUS | Status: AC
  Administered 2023-07-10: 20 mg via INTRAVENOUS
  Filled 2023-07-10: qty 5

## 2023-07-10 MED ORDER — HEPARIN SODIUM (PORCINE) 1000 UNIT/ML IJ SOLN
INTRAMUSCULAR | Status: DC | PRN
  Administered 2023-07-10: 5000 [IU] via INTRAVENOUS
  Administered 2023-07-10: 4000 [IU] via INTRAVENOUS

## 2023-07-10 MED ORDER — PANTOPRAZOLE SODIUM 40 MG IV SOLR
40.0000 mg | Freq: Every day | INTRAVENOUS | Status: DC
  Administered 2023-07-10 – 2023-07-27 (×18): 40 mg via INTRAVENOUS
  Filled 2023-07-10 (×18): qty 10

## 2023-07-10 MED ORDER — ACETAMINOPHEN 650 MG RE SUPP: 650.0000 mg | Freq: Four times a day (QID) | RECTAL | Status: DC | PRN

## 2023-07-10 MED ORDER — CALCIUM CHLORIDE 10 % IV SOLN
INTRAVENOUS | Status: DC | PRN
  Administered 2023-07-10: 1 g via INTRAVENOUS

## 2023-07-10 MED ORDER — MIDAZOLAM HCL 2 MG/2ML IJ SOLN
INTRAMUSCULAR | Status: AC
Start: 2023-07-10 — End: 2023-07-10
  Filled 2023-07-10: qty 2

## 2023-07-10 MED ORDER — THIAMINE HCL 100 MG/ML IJ SOLN: 100.0000 mg | Freq: Every day | INTRAMUSCULAR | Status: DC

## 2023-07-10 MED ORDER — METOPROLOL TARTRATE 5 MG/5ML IV SOLN
5.0000 mg | Freq: Four times a day (QID) | INTRAVENOUS | Status: DC | PRN
  Administered 2023-07-10: 5 mg via INTRAVENOUS
  Filled 2023-07-10: qty 5

## 2023-07-10 MED ORDER — ADULT MULTIVITAMIN W/MINERALS CH: 1.0000 | ORAL_TABLET | Freq: Every day | ORAL | Status: DC

## 2023-07-10 MED ORDER — DEXTROSE 50 % IV SOLN
INTRAVENOUS | Status: AC
Start: 2023-07-10 — End: 2023-07-10
  Filled 2023-07-10: qty 50

## 2023-07-10 MED ORDER — EPINEPHRINE 1 MG/10ML IJ SOSY
PREFILLED_SYRINGE | INTRAMUSCULAR | Status: AC
  Filled 2023-07-10: qty 50

## 2023-07-10 MED ORDER — TENECTEPLASE 50 MG IV KIT: 50.0000 mg | PACK | INTRAVENOUS | Status: DC

## 2023-07-10 MED ORDER — SODIUM CHLORIDE 0.9 % IV SOLN
0.1500 mg/kg/h | INTRAVENOUS | Status: DC
  Administered 2023-07-11: 0.15 mg/kg/h via INTRAVENOUS
  Filled 2023-07-10: qty 250

## 2023-07-10 MED ORDER — ORAL CARE MOUTH RINSE
15.0000 mL | OROMUCOSAL | Status: DC
  Administered 2023-07-10 – 2023-07-12 (×20): 15 mL via OROMUCOSAL

## 2023-07-10 MED ORDER — FENTANYL 2500MCG IN NS 250ML (10MCG/ML) PREMIX INFUSION
50.0000 ug/h | INTRAVENOUS | Status: DC
  Filled 2023-07-10: qty 250

## 2023-07-10 MED ORDER — HEPARIN BOLUS VIA INFUSION
1500.0000 [IU] | Freq: Once | INTRAVENOUS | Status: AC
  Administered 2023-07-10: 1500 [IU] via INTRAVENOUS
  Filled 2023-07-10: qty 1500

## 2023-07-10 MED ORDER — SENNOSIDES-DOCUSATE SODIUM 8.6-50 MG PO TABS: 1.0000 | ORAL_TABLET | Freq: Every evening | ORAL | Status: DC | PRN

## 2023-07-10 MED ORDER — LORAZEPAM 2 MG/ML IJ SOLN
1.0000 mg | INTRAMUSCULAR | Status: DC | PRN
  Administered 2023-07-10: 3 mg via INTRAVENOUS
  Filled 2023-07-10: qty 2

## 2023-07-10 MED ORDER — FOLIC ACID 1 MG PO TABS
1.0000 mg | ORAL_TABLET | Freq: Every day | ORAL | Status: DC
  Administered 2023-07-11 – 2023-07-15 (×5): 1 mg
  Filled 2023-07-10 (×5): qty 1

## 2023-07-10 MED ORDER — THIAMINE MONONITRATE 100 MG PO TABS: 100.0000 mg | ORAL_TABLET | Freq: Every day | ORAL | Status: DC

## 2023-07-10 MED ORDER — MIDAZOLAM HCL 2 MG/2ML IJ SOLN
INTRAMUSCULAR | Status: DC | PRN
  Administered 2023-07-10: 2 mg via INTRAVENOUS
  Administered 2023-07-10: 3 mg via INTRAVENOUS

## 2023-07-10 MED ORDER — MIDAZOLAM HCL 5 MG/5ML IJ SOLN
INTRAMUSCULAR | Status: AC
  Filled 2023-07-10: qty 5

## 2023-07-10 MED ORDER — MIDAZOLAM-SODIUM CHLORIDE 100-0.9 MG/100ML-% IV SOLN
0.0000 mg/h | INTRAVENOUS | Status: DC
  Administered 2023-07-10 – 2023-07-11 (×3): 2 mg/h via INTRAVENOUS
  Filled 2023-07-10 (×3): qty 100

## 2023-07-10 MED ORDER — FENTANYL CITRATE PF 50 MCG/ML IJ SOSY: 50.0000 ug | PREFILLED_SYRINGE | Freq: Once | INTRAMUSCULAR | Status: DC

## 2023-07-10 MED ORDER — SODIUM CHLORIDE 0.9% IV SOLUTION: Freq: Once | INTRAVENOUS | Status: DC

## 2023-07-10 MED ORDER — INSULIN ASPART 100 UNIT/ML IJ SOLN
INTRAMUSCULAR | Status: DC | PRN
  Administered 2023-07-10: 10 [IU] via SUBCUTANEOUS

## 2023-07-10 MED ORDER — ETOMIDATE 2 MG/ML IV SOLN
INTRAVENOUS | Status: AC
  Filled 2023-07-10: qty 20

## 2023-07-10 MED ORDER — INSULIN REGULAR(HUMAN) IN NACL 100-0.9 UT/100ML-% IV SOLN: INTRAVENOUS | Status: DC

## 2023-07-10 MED ORDER — HYDRALAZINE HCL 20 MG/ML IJ SOLN
5.0000 mg | Freq: Once | INTRAMUSCULAR | Status: AC
  Administered 2023-07-10: 5 mg via INTRAVENOUS
  Filled 2023-07-10: qty 1

## 2023-07-10 MED ORDER — METOPROLOL TARTRATE 5 MG/5ML IV SOLN
5.0000 mg | Freq: Once | INTRAVENOUS | Status: AC
  Administered 2023-07-10: 5 mg via INTRAVENOUS
  Filled 2023-07-10: qty 5

## 2023-07-10 MED ORDER — NITROPRUSSIDE SODIUM-NACL 20-0.9 MG/100ML-% IV SOLN
0.0000 ug/kg/min | INTRAVENOUS | Status: DC
  Filled 2023-07-10: qty 100

## 2023-07-10 MED ORDER — PROPRANOLOL HCL 80 MG PO TABS
80.0000 mg | ORAL_TABLET | Freq: Four times a day (QID) | ORAL | Status: DC
Start: 2023-07-10 — End: 2023-07-10
  Administered 2023-07-10: 80 mg via ORAL
  Filled 2023-07-10: qty 4
  Filled 2023-07-10 (×2): qty 1

## 2023-07-10 MED ORDER — DEXTROSE 50 % IV SOLN
25.0000 mL | Freq: Once | INTRAVENOUS | Status: AC
  Administered 2023-07-10: 25 mL via INTRAVENOUS

## 2023-07-10 MED ORDER — HYDROCODONE-ACETAMINOPHEN 5-325 MG PO TABS
1.0000 | ORAL_TABLET | ORAL | Status: DC | PRN
  Administered 2023-07-10 (×2): 2 via ORAL
  Filled 2023-07-10 (×2): qty 2

## 2023-07-10 MED ORDER — MILRINONE LACTATE IN DEXTROSE 20-5 MG/100ML-% IV SOLN
INTRAVENOUS | Status: AC
  Filled 2023-07-10: qty 100

## 2023-07-10 MED ORDER — SODIUM BICARBONATE 8.4 % IV SOLN
INTRAVENOUS | Status: DC | PRN
Start: 2023-07-10 — End: 2023-07-10
  Administered 2023-07-10 (×2): 50 meq via INTRAVENOUS

## 2023-07-10 MED ORDER — PHENYLEPHRINE 80 MCG/ML (10ML) SYRINGE FOR IV PUSH (FOR BLOOD PRESSURE SUPPORT)
PREFILLED_SYRINGE | INTRAVENOUS | Status: AC
  Filled 2023-07-10: qty 10

## 2023-07-10 MED ORDER — FENTANYL 2500MCG IN NS 250ML (10MCG/ML) PREMIX INFUSION
0.0000 ug/h | INTRAVENOUS | Status: DC
  Administered 2023-07-10: 75 ug/h via INTRAVENOUS
  Administered 2023-07-11: 150 ug/h via INTRAVENOUS
  Administered 2023-07-12: 100 ug/h via INTRAVENOUS
  Administered 2023-07-13 – 2023-07-14 (×4): 200 ug/h via INTRAVENOUS
  Filled 2023-07-10 (×8): qty 250

## 2023-07-10 MED ORDER — ALBUMIN HUMAN 5 % IV SOLN
INTRAVENOUS | Status: AC
  Filled 2023-07-10: qty 250

## 2023-07-10 MED ORDER — METOPROLOL TARTRATE 25 MG PO TABS
100.0000 mg | ORAL_TABLET | Freq: Two times a day (BID) | ORAL | Status: DC
  Administered 2023-07-10: 100 mg via ORAL
  Filled 2023-07-10: qty 4

## 2023-07-10 MED ORDER — SODIUM CHLORIDE 0.9 % IV SOLN: INTRAVENOUS | Status: DC | PRN

## 2023-07-10 MED ORDER — HALOPERIDOL LACTATE 5 MG/ML IJ SOLN
5.0000 mg | Freq: Four times a day (QID) | INTRAMUSCULAR | Status: DC | PRN
  Filled 2023-07-10: qty 1

## 2023-07-10 MED ORDER — VASOPRESSIN 20 UNITS/100 ML INFUSION FOR SHOCK
INTRAVENOUS | Status: DC | PRN
  Administered 2023-07-10: .04 [IU]/h via INTRAVENOUS

## 2023-07-10 MED ORDER — THIAMINE MONONITRATE 100 MG PO TABS
100.0000 mg | ORAL_TABLET | Freq: Every day | ORAL | Status: DC
  Administered 2023-07-12 – 2023-07-15 (×3): 100 mg
  Filled 2023-07-10 (×5): qty 1

## 2023-07-10 MED ORDER — POLYETHYLENE GLYCOL 3350 17 G PO PACK
17.0000 g | PACK | Freq: Every day | ORAL | Status: DC
  Administered 2023-07-11 – 2023-07-13 (×3): 17 g
  Filled 2023-07-10 (×3): qty 1

## 2023-07-10 MED ORDER — ORAL CARE MOUTH RINSE: 15.0000 mL | OROMUCOSAL | Status: DC | PRN

## 2023-07-10 MED ORDER — SODIUM CHLORIDE 0.9 % IV SOLN
2.0000 g | INTRAVENOUS | Status: DC
  Administered 2023-07-10 – 2023-07-11 (×2): 2 g via INTRAVENOUS
  Filled 2023-07-10 (×3): qty 20

## 2023-07-10 MED ORDER — MORPHINE SULFATE (PF) 2 MG/ML IV SOLN
2.0000 mg | INTRAVENOUS | Status: DC | PRN
  Administered 2023-07-10: 2 mg via INTRAVENOUS
  Filled 2023-07-10: qty 1

## 2023-07-10 MED ORDER — DOCUSATE SODIUM 50 MG/5ML PO LIQD
100.0000 mg | Freq: Two times a day (BID) | ORAL | Status: DC
  Administered 2023-07-10 – 2023-07-13 (×6): 100 mg
  Filled 2023-07-10 (×6): qty 10

## 2023-07-10 MED ORDER — SODIUM ZIRCONIUM CYCLOSILICATE 10 G PO PACK
10.0000 g | PACK | Freq: Every day | ORAL | Status: DC
  Filled 2023-07-10: qty 1

## 2023-07-10 MED ORDER — MIDAZOLAM BOLUS VIA INFUSION
0.0000 mg | INTRAVENOUS | Status: DC | PRN
  Administered 2023-07-10: 2 mg via INTRAVENOUS
  Administered 2023-07-10: 5 mg via INTRAVENOUS
  Administered 2023-07-11: 2 mg via INTRAVENOUS

## 2023-07-10 MED ORDER — DEXTROSE 50 % IV SOLN
0.0000 mL | INTRAVENOUS | Status: DC | PRN
  Administered 2023-07-10: 50 mL via INTRAVENOUS
  Filled 2023-07-10: qty 50

## 2023-07-10 MED ORDER — FENTANYL CITRATE PF 50 MCG/ML IJ SOSY
PREFILLED_SYRINGE | INTRAMUSCULAR | Status: AC
  Filled 2023-07-10: qty 2

## 2023-07-10 MED ORDER — ROCURONIUM BROMIDE 100 MG/10ML IV SOLN
INTRAVENOUS | Status: DC | PRN
  Administered 2023-07-10: 100 mg via INTRAVENOUS

## 2023-07-10 MED ORDER — DOCUSATE SODIUM 50 MG/5ML PO LIQD: 100.0000 mg | Freq: Two times a day (BID) | ORAL | Status: DC

## 2023-07-10 MED ORDER — DEXTROSE 50 % IV SOLN
INTRAVENOUS | Status: AC
  Filled 2023-07-10: qty 50

## 2023-07-10 MED ORDER — DEXTROSE 50 % IV SOLN
INTRAVENOUS | Status: DC | PRN
  Administered 2023-07-10: 25 g via INTRAVENOUS

## 2023-07-10 MED ORDER — TENECTEPLASE 50 MG IV KIT
PACK | INTRAVENOUS | Status: AC
  Filled 2023-07-10: qty 10

## 2023-07-10 MED ORDER — FENTANYL BOLUS VIA INFUSION
50.0000 ug | INTRAVENOUS | Status: DC | PRN
  Administered 2023-07-10 – 2023-07-11 (×2): 100 ug via INTRAVENOUS

## 2023-07-10 MED ORDER — SODIUM BICARBONATE 8.4 % IV SOLN
INTRAVENOUS | Status: DC
  Filled 2023-07-10 (×9): qty 25

## 2023-07-10 MED ORDER — SODIUM CHLORIDE 0.9 % IV SOLN
0.5000 ug/min | INTRAVENOUS | Status: DC
  Filled 2023-07-10 (×2): qty 5

## 2023-07-10 MED ORDER — SODIUM CHLORIDE 0.9% FLUSH
3.0000 mL | Freq: Two times a day (BID) | INTRAVENOUS | Status: DC
  Administered 2023-07-10 – 2023-08-04 (×48): 3 mL via INTRAVENOUS

## 2023-07-10 MED ORDER — BISACODYL 5 MG PO TBEC: 5.0000 mg | DELAYED_RELEASE_TABLET | Freq: Every day | ORAL | Status: DC | PRN

## 2023-07-10 MED ORDER — INSULIN ASPART 100 UNIT/ML IV SOLN
5.0000 [IU] | Freq: Once | INTRAVENOUS | Status: AC
  Administered 2023-07-10: 5 [IU] via INTRAVENOUS

## 2023-07-10 MED ORDER — PROCHLORPERAZINE EDISYLATE 10 MG/2ML IJ SOLN
10.0000 mg | Freq: Four times a day (QID) | INTRAMUSCULAR | Status: DC | PRN
  Administered 2023-07-10 (×2): 10 mg via INTRAVENOUS
  Filled 2023-07-10 (×2): qty 2

## 2023-07-10 MED ORDER — THIAMINE HCL 100 MG/ML IJ SOLN
100.0000 mg | Freq: Every day | INTRAMUSCULAR | Status: DC
  Administered 2023-07-11 – 2023-07-13 (×2): 100 mg via INTRAVENOUS
  Filled 2023-07-10 (×2): qty 2

## 2023-07-10 MED ORDER — FOLIC ACID 1 MG PO TABS: 1.0000 mg | ORAL_TABLET | Freq: Every day | ORAL | Status: DC

## 2023-07-10 MED FILL — Medication: Qty: 1 | Status: AC

## 2023-07-10 SURGICAL SUPPLY — 24 items
CANNULA ADULT BIO-MEDICUS 21FR (MISCELLANEOUS) IMPLANT
CANNULA FEM BIOMEDICUS 25FR (CANNULA) IMPLANT
CATH SWAN GANZ 7F STRAIGHT (CATHETERS) IMPLANT
CLOSURE PERCLOSE PROSTYLE (VASCULAR PRODUCTS) IMPLANT
COVER DOME SNAP 22 D (MISCELLANEOUS) IMPLANT
DEVICE IMPELLA CP SMRT ASSIST (CATHETERS) IMPLANT
GUIDEWIRE SAFE TJ AMPLATZ EXST (WIRE) IMPLANT
KIT CV 3L 7FR 20CM SULFAFREE (SET/KITS/TRAYS/PACK) IMPLANT
KIT DILATOR VASC 18G NDL (KITS) IMPLANT
KIT MICROPUNCTURE NIT STIFF (SHEATH) IMPLANT
PACK CARDIAC CATHETERIZATION (CUSTOM PROCEDURE TRAY) IMPLANT
SET TUBING HLS ADVANCED 7.0 (TUBING) IMPLANT
SHEATH PINNACLE 5F 10CM (SHEATH) IMPLANT
SHEATH PINNACLE 6F 10CM (SHEATH) IMPLANT
SHEATH PINNACLE 7F 10CM (SHEATH) IMPLANT
SHEATH PROBE COVER 6X72 (BAG) IMPLANT
SHEATH SUPER FLEX 6FR 11 (SHEATH) IMPLANT
TRANSDUCER W/STOPCOCK (MISCELLANEOUS) IMPLANT
TUBING ART PRESS 72 MALE/FEM (TUBING) IMPLANT
WIRE AMPLATZ SS-J .035X180CM (WIRE) IMPLANT
WIRE AMPLATZ SSTIFF .035X260CM (WIRE) IMPLANT
WIRE EMERALD 3MM-J .035X150CM (WIRE) IMPLANT
WIRE EMERALD 3MM-J .035X260CM (WIRE) IMPLANT
WIRE MICRO SET SILHO 5FR 7 (SHEATH) IMPLANT

## 2023-07-10 NOTE — TOC Initial Note (Signed)
 Transition of Care [facility]) - Initial/Assessment Note    Patient Details  Name: Aaron [name] MRN: [id number] Date of Birth: June 01, 1985  [facility] CM/SW Contact:    Adrian Prows, RN Phone Number: 07/10/2023, 10:14 AM  Clinical Narrative:                 No PCP listed; spoke w/ pt and SO [name] Trice in room; pt says he lives at home, and he plans to return at d/c; he identified POCs  Eartha Inch (aunt) [phone] / Honor Loh (SO) [phone]; pt verified insurance; pt says he has a PCP, but he has not yet seen the provider; his SO will provide transportation; pt denies SDOH risks; he does not have DME, HH services, or home oxygen; pt says he will make appt w/ his PCP; TOC will follow.  Expected Discharge Plan: Home/Self Care Barriers to Discharge: Continued Medical Work up   Patient Goals and CMS Choice Patient states their goals for this hospitalization and ongoing recovery are:: home          Expected Discharge Plan and Services   Discharge Planning Services: CM Consult   Living arrangements for the past 2 months: Apartment                                      Prior Living Arrangements/Services Living arrangements for the past 2 months: Apartment Lives with:: Significant Other Patient language and need for interpreter reviewed:: Yes Do you feel safe going back to the place where you live?: Yes      Need for Family Participation in Patient Care: Yes (Comment) Care giver support system in place?: Yes (comment) Current home services:  (n/a) Criminal Activity/Legal Involvement Pertinent to Current Situation/Hospitalization: No - Comment as needed  Activities of Daily Living   ADL Screening (condition at time of admission) Independently performs ADLs?: Yes (appropriate for developmental age) Is the patient deaf or have difficulty hearing?: No Does the patient have difficulty seeing, even when wearing glasses/contacts?: No Does the  patient have difficulty concentrating, remembering, or making decisions?: No  Permission Sought/Granted Permission sought to share information with : Case Manager Permission granted to share information with : Yes, Verbal Permission Granted  Share Information with NAME: Case Manager     Permission granted to share info w Relationship: Eartha Inch (aunt) [phone] / Honor Loh (SO) [phone]     Emotional Assessment Appearance:: Appears stated age Attitude/Demeanor/Rapport: Gracious Affect (typically observed): Accepting Orientation: : Oriented to Self, Oriented to Place, Oriented to  Time, Oriented to Situation Alcohol / Substance Use: Not Applicable Psych Involvement: No (comment)  Admission diagnosis:  Hyperthyroidism [E05.90] Tachycardia [R00.0] Bilateral pulmonary embolism (HCC) [I26.99] Patient Active Problem List   Diagnosis Date Noted   Bilateral pulmonary embolism (HCC) 07/09/2023   Chronic heart failure with preserved ejection fraction (HFpEF) (HCC) 07/09/2023   Severe mitral regurgitation 07/09/2023   Paroxysmal atrial fibrillation (HCC) 07/09/2023   Atrial fibrillation with rapid ventricular response (HCC) 03/20/2023   Chest pain 03/30/2022   Essential hypertension 03/30/2022   Hypomagnesemia 03/30/2022   Hyperthyroidism 10/23/2021   Elevated troponin 10/19/2021   NSTEMI (non-ST elevated myocardial infarction) (HCC) 10/19/2021   PCP:  Patient, No Pcp Per Pharmacy:   [id number] [place], [place] - [address] [place] [address] Phone: [phone] Fax: [phone]     Social Drivers of  Health (SDOH) Social History: SDOH Screenings   Food Insecurity: No Food Insecurity (07/10/2023)  Housing: Low Risk  (07/10/2023)  Transportation Needs: No Transportation Needs (07/10/2023)  Utilities: Not At Risk (07/10/2023)  Financial Resource Strain: Medium Risk (04/01/2023)  Tobacco Use: Low Risk  (07/09/2023)  Health Literacy:  Adequate Health Literacy (04/01/2023)   SDOH Interventions: Food Insecurity Interventions: Intervention Not Indicated, Inpatient TOC Housing Interventions: Intervention Not Indicated, Inpatient TOC Transportation Interventions: Intervention Not Indicated, Inpatient TOC Utilities Interventions: Intervention Not Indicated, Inpatient TOC   Readmission Risk Interventions    07/10/2023   10:12 AM  Readmission Risk Prevention Plan  Transportation Screening Complete  PCP or Specialist Appt within 5-7 Days Complete  Home Care Screening Complete  Medication Review (RN CM) Complete

## 2023-07-10 NOTE — Progress Notes (Signed)
   07/10/23 1145  Spiritual Encounters  Type of Visit Initial  Care provided to: [facility] partners present during encounter Nurse;Physician  Referral source Code page  Reason for visit Code  OnCall Visit No   Responded to code blue. Provided support to 2 sisters, girlfriend and interdisciplinary team. Patient being transferred to [facility], will follow-up with family there.

## 2023-07-10 NOTE — Progress Notes (Incomplete)
 PHARMACY - ANTICOAGULATION CONSULT NOTE  Pharmacy Consult for bivalirudin Indication:  ECMO, Impella  No Known Allergies  Patient Measurements: Height: 6\' 4"  (193 cm) Weight: 97.5 kg (215 lb) IBW/kg (Calculated) : 86.8 Heparin Dosing Weight: n/a  Vital Signs: Temp: 97.9 F (36.6 C) (02/27 1900) Temp Source: Core (02/27 1700) BP: 123/100 (02/27 1900) Pulse Rate: 43 (02/27 1715)  Labs: Recent Labs    07/09/23 1851 07/09/23 2047 07/10/23 0644 07/10/23 1224 07/10/23 1310 07/10/23 1425 07/10/23 1428 07/10/23 1501 07/10/23 1521 07/10/23 1630 07/10/23 1708 07/10/23 1709 07/10/23 1802 07/10/23 1924 07/10/23 2015  HGB 11.1*  --  12.0* 10.6*   < > 10.1*   < > 9.9*   < >  --  10.0*   < > 10.2* 10.5* 9.6*  HCT 37.3*  --  42.5 37.0*   < > 33.7*   < > 29.0*   < >  --  31.3*   < > 30.0* 31.0* 30.1*  PLT 344  --  421* 269  --  199  --   --   --   --  199  --   --   --  210  APTT 36  --   --   --   --  134*  --   --   --  >200*  --   --   --   --   --   LABPROT 17.1*  --   --  31.8*  --   --   --   --   --   --  42.0*  --   --   --   --   INR 1.4*  --   --  3.1*  --   --   --   --   --   --  4.4*  --   --   --   --   HEPARINUNFRC <0.10*  --  0.23* <0.10*  --   --   --   --   --   --  0.23*  --   --   --   --   CREATININE 0.81  --  0.93 1.53*   < > 1.88*  --  1.80*  --   --  1.70*  --   --   --   --   TROPONINIHS 64* 56*  --  263*  --  597*  --   --   --   --   --   --   --   --   --    < > = values in this interval not displayed.    Estimated Creatinine Clearance: 72.3 mL/min (A) (by C-G formula based on SCr of 1.7 mg/dL (H)).   Medical History: Past Medical History:  Diagnosis Date   Atrial fibrillation [facility])    Hyperthyroidism    Mitral regurgitation    NSTEMI (non-ST elevated myocardial infarction) [facility])     Assessment: 38 yo M with bilateral PE s/p TNK (2/27 @1156 ) now on [place] ECMO + Impella. Pharmacy consulted for bivalirudin for anticoagulation.   aPTT *** Hgb  9.6, Plt 210   Goal of Therapy:  aPTT 50-70 seconds Monitor platelets by anticoagulation protocol: Yes   Plan:   Plan to start bivalirudin at 0.15 mg/kg/hr once aPTT falls within dosing range aPTT q4h until therapeutic then transition to q12h F/u CBC, LDH   Calton Dach, PharmD, BCCCP Clinical Pharmacist 07/10/2023 8:33 PM

## 2023-07-10 NOTE — Progress Notes (Signed)
 While visiting another pt, chaplain was graciously informed by interdisciplinary team that this pt's family could likely use some support. Upon arrival to pt's room, pt was being tended to by medical care team and unresponsive.   Family member was seated and on the phone. I briefly introduced myself and let her know I was the on-call chaplain for the evening, ready to provide spiritual care if/when needed. Family member thanked me and returned to phone call.

## 2023-07-10 NOTE — H&P (View-Only) (Signed)
 Arrived at bedside with CPR in progress. Initial rhythm pulseless V. tach followed by PEA.  Regained ROSC for 2 minutes and then again PEA, total time to ROSC was about 16 to 18 minutes .  Given TNK while CPR in progress Bedside echo showed dilated LV and RV, bradycardic A-flutter rhythm. Intubated, vent settings adjusted. ABG showing metabolic plus respiratory acidosis, respiratory rate increased and bicarb given Family updated at bedside. Epinephrine and levo drips titrated to maintain blood pressure Right radial arterial line placed ECMO team contacted -plan to transport to 2 heart for possible ECMO.  My critical care time independent of procedure was 70 minutes  Aaron Nile V. Vassie Loll MD

## 2023-07-10 NOTE — Consult Note (Signed)
 ECMO Consult Note   Called to [facility] for ECMO Consult at (time)12:03pm by Cyril Mourning MD Admitting Diagnosis- Pulmonary embolism Primary Issue- Cardiac arrest , severe MR, cardiogenic shock, Acute PE, hyperthyroidism, Aflutter with RVR. Age:38 y.o. Weight: 97.5kg  Days on Mechanical Ventilation- 0  MAP FiO2 Oxygen Index P/F Ratio         Vasopressors yes- epi, NE   MSOF Yes   RESP score (VV-ECMO) : http://www.respscore.com  SAVE score (VA-ECMO) :  -1 http://www.save-score.com  Recent Blood Gas:     Component Value Date/Time   PHART 7.490 (H) 07/10/2023 1553   PCO2ART 24.3 (L) 07/10/2023 1553   PO2ART 455 (H) 07/10/2023 1553   HCO3 18.8 (L) 07/10/2023 1553   TCO2 20 (L) 07/10/2023 1553   ACIDBASEDEF 4.0 (H) 07/10/2023 1553   O2SAT 100 07/10/2023 1553    Coags:    Component Value Date/Time   INR 3.1 (H) 07/10/2023 1224    CBC    Component Value Date/Time   WBC 23.2 (H) 07/10/2023 1425   RBC 4.75 07/10/2023 1425   HGB 10.2 (L) 07/10/2023 1553   HCT 30.0 (L) 07/10/2023 1553   PLT 199 07/10/2023 1425   MCV 70.9 (L) 07/10/2023 1425   MCH 21.3 (L) 07/10/2023 1425   MCHC 30.0 07/10/2023 1425   RDW 15.9 (H) 07/10/2023 1425   LYMPHSABS 1.8 03/20/2023 0429   MONOABS 1.2 (H) 03/20/2023 0429   EOSABS 0.1 03/20/2023 0429   BASOSABS 0.0 03/20/2023 0429    BMET    Component Value Date/Time   NA 142 07/10/2023 1553   K 4.5 07/10/2023 1553   CL 105 07/10/2023 1501   CO2 17 (L) 07/10/2023 1425   GLUCOSE 119 (H) 07/10/2023 1501   BUN 21 (H) 07/10/2023 1501   CREATININE 1.80 (H) 07/10/2023 1501   CALCIUM 11.4 (H) 07/10/2023 1425   GFRNONAA 46 (L) 07/10/2023 1425   GFRAA >60 08/16/2016 0502                                                                                                                                                             ECMO physician Harrold Fitchett, Bensimhon notified at 12:03 (time) Candidate meets ECMO Criteria- Yes  Placed on ECMO watch at 12:55  (time). Coordinated with carelink transfer; had to determine if it was feasible to cannulate then transfer. Decision was made to transfer with physician directly to cath lab for cannulation  Additional notes- rapid decompensation this morning-- agitation treated with ativan, rapidly deteriorated to respiratory failure. Intubated, cardiac arrest, x 2, refractory cardiogenic shock after.  Reason for Consult:cardiac arrest, cardiogenic shock Referring Physician: Assad Harbeson is an 38 y.o. male.  HPI: Mr. Bong is a 38 y/o gentleman with a history of hyperthyroidism, Afib, mitral regurigtation who  presented on 2/26 with CP and SOB x 4 days. Pain worsened with inspiration; radiates down left arm with paresthesias. Has had cough, vomiting, diarrhea. Some thigh pain on left. He was diagnosed with PE in the ED, started on heparin. His hyperthyroidism was managed with steroids, Bblcoker, methimazole. He developed progressive agitation this morning, treated with CIWA ativan and haldol due to concern he had ETOH withdrawal. He rapidly deteriorated with respiratory failure and VT cardiac arrest. During the code he was intubated by anesthesia. Brief ROSC, then recurrent arrest. Total CPR time ~14 min. ECMO consult called at the time of ROSC after second arrest. ECMO team responded to [facility] for evaluation and mangement pre-transfer.  Past Medical History:  Diagnosis Date   Medical history non-contributory     Past Surgical History:  Procedure Laterality Date   LEFT HEART CATH AND CORONARY ANGIOGRAPHY N/A 10/22/2021   Procedure: LEFT HEART CATH AND CORONARY ANGIOGRAPHY;  Surgeon: Yvonne Kendall, MD;  Location: [facility] INVASIVE CV LAB;  Service: Cardiovascular;  Laterality: N/A;   NO PAST SURGERIES      Family History  Problem Relation Age of Onset   Heart disease Other     Social History:  reports that he has never smoked. He has never used smokeless tobacco. He reports current drug use. Frequency:  3.00 times per week. Drug: Marijuana. He reports that he does not drink alcohol.  Allergies: No Known Allergies  Medications: Prior to Admission:  Medications Prior to Admission  Medication Sig Dispense Refill Last Dose/Taking   bismuth subsalicylate (PEPTO BISMOL) 262 MG chewable tablet Chew 524 mg by mouth as needed for indigestion.   07/09/2023   apixaban (ELIQUIS) 5 MG TABS tablet Take 1 tablet (5 mg total) by mouth 2 (two) times daily. (Patient not taking: Reported on 07/09/2023) 180 tablet 3 Not Taking   methimazole (TAPAZOLE) 10 MG tablet Take 1 tablet (10 mg total) by mouth every 8 (eight) hours. 90 tablet 2    metoprolol tartrate 37.5 MG TABS Take 1 tablet (37.5 mg total) by mouth 2 (two) times daily. 60 tablet 2    spironolactone (ALDACTONE) 25 MG tablet Take 1 tablet (25 mg total) by mouth daily. (Patient not taking: Reported on 07/09/2023) 90 tablet 3 Not Taking   Scheduled:  [MAR Hold] sodium chloride   Intravenous Once   sodium chloride   Intravenous Once   sodium chloride   Intravenous Once   sodium chloride   Intravenous Once   calcium chloride       [MAR Hold] Chlorhexidine Gluconate Cloth  6 each Topical Daily   [MAR Hold] docusate  100 mg Per Tube BID   EPINEPHrine       etomidate       fentaNYL       [MAR Hold] fentaNYL (SUBLIMAZE) injection  50 mcg Intravenous Once   [MAR Hold] folic acid  1 mg Oral Daily   [MAR Hold] methimazole  10 mg Oral Q8H   [MAR Hold] multivitamin with minerals  1 tablet Oral Daily   pantoprazole (PROTONIX) IV  40 mg Intravenous QHS   [MAR Hold] polyethylene glycol  17 g Per Tube Daily   [MAR Hold] propranolol  80 mg Oral QID   [MAR Hold] sodium chloride flush  3 mL Intravenous Q12H   [MAR Hold] sodium zirconium cyclosilicate  10 g Oral Daily   [MAR Hold] spironolactone  25 mg Oral Daily   tenecteplase       [MAR Hold] tenecteplase  50  mg Intravenous STAT   [MAR Hold] thiamine  100 mg Oral Daily   Or   [MAR Hold] thiamine  100 mg  Intravenous Daily   Continuous:  cefTRIAXone (ROCEPHIN)  IV     dextrose 5% lactated ringers     [MAR Hold] epinephrine     fentaNYL infusion INTRAVENOUS     fentaNYL infusion INTRAVENOUS 75 mcg/hr (07/10/23 1347)   insulin     lactated ringers     lactated ringers     midazolam     milrinone     [MAR Hold] norepinephrine (LEVOPHED) Adult infusion Stopped (07/10/23 1601)   propofol (DIPRIVAN) infusion     sodium bicarbonate 25 mEq (Impella PURGE) in dextrose 5 % 1000 mL bag      Results for orders placed or performed during the hospital encounter of 07/09/23 (from the past 48 hours)  Basic metabolic panel     Status: Abnormal   Collection Time: 07/09/23  6:51 PM  Result Value Ref Range   Sodium 136 135 - 145 mmol/L   Potassium 3.9 3.5 - 5.1 mmol/L   Chloride 107 98 - 111 mmol/L   CO2 20 (L) 22 - 32 mmol/L   Glucose, Bld 99 70 - 99 mg/dL    Comment: Glucose reference range applies only to samples taken after fasting for at least 8 hours.   BUN 11 6 - 20 mg/dL   Creatinine, Ser 1.91 0.61 - 1.24 mg/dL   Calcium 8.7 (L) 8.9 - 10.3 mg/dL   GFR, Estimated >47 >82 mL/min    Comment: (NOTE) Calculated using the CKD-EPI Creatinine Equation (2021)    Anion gap 9 5 - 15    Comment: Performed at [facility], [address], [place], [place] [address]  CBC     Status: Abnormal   Collection Time: 07/09/23  6:51 PM  Result Value Ref Range   WBC 9.2 4.0 - 10.5 K/uL   RBC 5.36 4.22 - 5.81 MIL/uL   Hemoglobin 11.1 (L) 13.0 - 17.0 g/dL   HCT 30.8 (L) 65.7 - 84.6 %   MCV 69.6 (L) 80.0 - 100.0 fL   MCH 20.7 (L) 26.0 - 34.0 pg   MCHC 29.8 (L) 30.0 - 36.0 g/dL   RDW 96.2 (H) 95.2 - 84.1 %   Platelets 344 150 - 400 K/uL   nRBC 0.3 (H) 0.0 - 0.2 %    Comment: Performed at [facility], [address], [place], [place] [address]  Troponin I (High Sensitivity)     Status: Abnormal   Collection Time: 07/09/23  6:51 PM  Result Value Ref Range   Troponin  I (High Sensitivity) 64 (H) <18 ng/L    Comment: (NOTE) Elevated high sensitivity troponin I (hsTnI) values and significant  changes across serial measurements may suggest ACS but many other  chronic and acute conditions are known to elevate hsTnI results.  Refer to the "Links" section for chest pain algorithms and additional  guidance. Performed at [facility], [address], [place], [place] [address]   Protime-INR (order if Patient is taking Coumadin / Warfarin)     Status: Abnormal   Collection Time: 07/09/23  6:51 PM  Result Value Ref Range   Prothrombin Time 17.1 (H) 11.4 - 15.2 seconds   INR 1.4 (H) 0.8 - 1.2    Comment: (NOTE) INR goal varies based on device and disease states. Performed at [facility], [address]  [place], [place] [address]   Heparin level (unfractionated)     Status: Abnormal   Collection Time: 07/09/23  6:51 PM  Result Value Ref Range   Heparin Unfractionated <0.10 (L) 0.30 - 0.70 IU/mL    Comment: (NOTE) The clinical reportable range upper limit is being lowered to >1.10 to align with the FDA approved guidance for the current laboratory assay.  If heparin results are below expected values, and patient dosage has  been confirmed, suggest follow up testing of antithrombin III levels. Performed at [facility], [address], [place], [place] [address]   APTT     Status: None   Collection Time: 07/09/23  6:51 PM  Result Value Ref Range   aPTT 36 24 - 36 seconds    Comment: Performed at [facility], [address] [place], [place] [address]  Resp panel by RT-PCR (RSV, Flu A&B, Covid) Anterior Nasal Swab     Status: None   Collection Time: 07/09/23  7:26 PM   Specimen: Anterior Nasal Swab  Result Value Ref Range   SARS Coronavirus 2 by RT PCR NEGATIVE NEGATIVE    Comment: (NOTE) SARS-CoV-2 target nucleic acids are NOT DETECTED.  The SARS-CoV-2 RNA is generally  detectable in upper respiratory specimens during the acute phase of infection. The lowest concentration of SARS-CoV-2 viral copies this assay can detect is 138 copies/mL. A negative result does not preclude SARS-Cov-2 infection and should not be used as the sole basis for treatment or other patient management decisions. A negative result may occur with  improper specimen collection/handling, submission of specimen other than nasopharyngeal swab, presence of viral mutation(s) within the areas targeted by this assay, and inadequate number of viral copies(<138 copies/mL). A negative result must be combined with clinical observations, patient history, and epidemiological information. The expected result is Negative.  Fact Sheet for Patients:  BloggerCourse.com  Fact Sheet for Healthcare Providers:  SeriousBroker.it  This test is no t yet approved or cleared by the [place] FDA and  has been authorized for detection and/or diagnosis of SARS-CoV-2 by FDA under an Emergency Use Authorization (EUA). This EUA will remain  in effect (meaning this test can be used) for the duration of the COVID-19 declaration under Section 564(b)(1) of the Act, 21 U.S.C.section 360bbb-3(b)(1), unless the authorization is terminated  or revoked sooner.       Influenza A by PCR NEGATIVE NEGATIVE   Influenza B by PCR NEGATIVE NEGATIVE    Comment: (NOTE) The Xpert Xpress SARS-CoV-2/FLU/RSV plus assay is intended as an aid in the diagnosis of influenza from Nasopharyngeal swab specimens and should not be used as a sole basis for treatment. Nasal washings and aspirates are unacceptable for Xpert Xpress SARS-CoV-2/FLU/RSV testing.  Fact Sheet for Patients: BloggerCourse.com  Fact Sheet for Healthcare Providers: SeriousBroker.it  This test is not yet approved or cleared by the [place] FDA and has  been authorized for detection and/or diagnosis of SARS-CoV-2 by FDA under an Emergency Use Authorization (EUA). This EUA will remain in effect (meaning this test can be used) for the duration of the COVID-19 declaration under Section 564(b)(1) of the Act, 21 U.S.C. section 360bbb-3(b)(1), unless the authorization is terminated or revoked.     Resp Syncytial Virus by PCR NEGATIVE NEGATIVE    Comment: (NOTE) Fact Sheet for Patients: BloggerCourse.com  Fact Sheet for Healthcare Providers: SeriousBroker.it  This test is not yet approved or cleared by the [place] and has been authorized for  detection and/or diagnosis of SARS-CoV-2 by FDA under an Emergency Use Authorization (EUA). This EUA will remain in effect (meaning this test can be used) for the duration of the COVID-19 declaration under Section 564(b)(1) of the Act, 21 U.S.C. section 360bbb-3(b)(1), unless the authorization is terminated or revoked.  Performed at [facility], [address], [place], [place] [address]   TSH     Status: Abnormal   Collection Time: 07/09/23  7:34 PM  Result Value Ref Range   TSH <0.010 (L) 0.350 - 4.500 uIU/mL    Comment: Performed by a 3rd Generation assay with a functional sensitivity of <=0.01 uIU/mL. Performed at [facility], [address] [place], [place] [address]   T4, free     Status: Abnormal   Collection Time: 07/09/23  7:34 PM  Result Value Ref Range   Free T4 2.15 (H) 0.61 - 1.12 ng/dL    Comment: (NOTE) Biotin ingestion may interfere with free T4 tests. If the results are inconsistent with the TSH level, previous test results, or the clinical presentation, then consider biotin interference. If needed, order repeat testing after stopping biotin. Performed at [facility], [address], [place], [place] [address]   D-dimer, quantitative     Status: Abnormal   Collection  Time: 07/09/23  7:34 PM  Result Value Ref Range   D-Dimer, Quant 1.83 (H) 0.00 - 0.50 ug/mL-FEU    Comment: (NOTE) At the manufacturer cut-off value of 0.5 g/mL FEU, this assay has a negative predictive value of 95-100%.This assay is intended for use in conjunction with a clinical pretest probability (PTP) assessment model to exclude pulmonary embolism (PE) and deep venous thrombosis (DVT) in outpatients suspected of PE or DVT. Results should be correlated with clinical presentation. Performed at [facility], [address], [place], [place] [address]   Hepatic function panel     Status: Abnormal   Collection Time: 07/09/23  7:34 PM  Result Value Ref Range   Total Protein 7.0 6.5 - 8.1 g/dL   Albumin 3.6 3.5 - 5.0 g/dL   AST 19 15 - 41 U/L   ALT 15 0 - 44 U/L   Alkaline Phosphatase 84 38 - 126 U/L   Total Bilirubin 1.7 (H) 0.0 - 1.2 mg/dL   Bilirubin, Direct 0.3 (H) 0.0 - 0.2 mg/dL   Indirect Bilirubin 1.4 (H) 0.3 - 0.9 mg/dL    Comment: Performed at [facility], [address], [place], [place] [address]  Lipase, blood     Status: None   Collection Time: 07/09/23  7:34 PM  Result Value Ref Range   Lipase 23 11 - 51 U/L    Comment: Performed at [facility], [address], [place], [place] [address]  Brain natriuretic peptide     Status: Abnormal   Collection Time: 07/09/23  7:34 PM  Result Value Ref Range   B Natriuretic Peptide 703.7 (H) 0.0 - 100.0 pg/mL    Comment: Performed at [facility], [address] [place], [place] [address]  Troponin I (High Sensitivity)     Status: Abnormal   Collection Time: 07/09/23  8:47 PM  Result Value Ref Range   Troponin I (High Sensitivity) 56 (H) <18 ng/L    Comment: (NOTE) Elevated high sensitivity troponin I (hsTnI) values and significant  changes across serial measurements may suggest ACS but many other  chronic and acute conditions are known to elevate  hsTnI results.  Refer to the "Links" section for chest pain algorithms and additional  guidance. Performed at [facility], [address] [place], [place] [address]   MRSA Next Gen by PCR, Nasal     Status: None   Collection Time: 07/10/23  2:14 AM   Specimen: Nasal Mucosa; Nasal Swab  Result Value Ref Range   MRSA by PCR Next Gen NOT DETECTED NOT DETECTED    Comment: (NOTE) The GeneXpert MRSA Assay (FDA approved for NASAL specimens only), is one component of a comprehensive MRSA colonization surveillance program. It is not intended to diagnose MRSA infection nor to guide or monitor treatment for MRSA infections. Test performance is not FDA approved in patients less than 75 years old. Performed at [facility], [address], [place], [place] [address]   Heparin level (unfractionated)     Status: Abnormal   Collection Time: 07/10/23  6:44 AM  Result Value Ref Range   Heparin Unfractionated 0.23 (L) 0.30 - 0.70 IU/mL    Comment: (NOTE) The clinical reportable range upper limit is being lowered to >1.10 to align with the FDA approved guidance for the current laboratory assay.  If heparin results are below expected values, and patient dosage has  been confirmed, suggest follow up testing of antithrombin III levels. Performed at [facility], [address], [place], [place] [address]   CBC     Status: Abnormal   Collection Time: 07/10/23  6:44 AM  Result Value Ref Range   WBC 14.7 (H) 4.0 - 10.5 K/uL   RBC 5.77 4.22 - 5.81 MIL/uL   Hemoglobin 12.0 (L) 13.0 - 17.0 g/dL   HCT 30.1 60.1 - 09.3 %   MCV 73.7 (L) 80.0 - 100.0 fL   MCH 20.8 (L) 26.0 - 34.0 pg   MCHC 28.2 (L) 30.0 - 36.0 g/dL   RDW 23.5 (H) 57.3 - 22.0 %   Platelets 421 (H) 150 - 400 K/uL   nRBC 0.5 (H) 0.0 - 0.2 %    Comment: Performed at [facility], [address] [place], [place] [address]  Basic metabolic panel     Status:  Abnormal   Collection Time: 07/10/23  6:44 AM  Result Value Ref Range   Sodium 130 (L) 135 - 145 mmol/L   Potassium 5.4 (H) 3.5 - 5.1 mmol/L   Chloride 101 98 - 111 mmol/L   CO2 16 (L) 22 - 32 mmol/L   Glucose, Bld 165 (H) 70 - 99 mg/dL    Comment: Glucose reference range applies only to samples taken after fasting for at least 8 hours.   BUN 17 6 - 20 mg/dL   Creatinine, Ser 0.62 0.61 - 1.24 mg/dL   Calcium 8.4 (L) 8.9 - 10.3 mg/dL   GFR, Estimated >37 >62 mL/min    Comment: (NOTE) Calculated using the CKD-EPI Creatinine Equation (2021)    Anion gap 13 5 - 15    Comment: Performed at [facility], [address] [place], [place] [address]  Hemoglobin A1c     Status: None   Collection Time: 07/10/23  6:44 AM  Result Value Ref Range   Hgb A1c MFr Bld 4.8 4.8 - 5.6 %    Comment: (NOTE) Pre diabetes:          5.7%-6.4%  Diabetes:              >6.4%  Glycemic control for   <7.0% adults  with diabetes    Mean Plasma Glucose 91.06 mg/dL    Comment: Performed at [facility], [address], [place], [place] [address]  Osmolality, urine     Status: Abnormal   Collection Time: 07/10/23  8:25 AM  Result Value Ref Range   Osmolality, Ur 990 (H) 300 - 900 mOsm/kg    Comment: REPEATED TO VERIFY Performed at [facility], [address] [place], [place] [address]   Sodium, urine, random     Status: None   Collection Time: 07/10/23  8:25 AM  Result Value Ref Range   Sodium, Ur 44 mmol/L    Comment: Performed at [facility], [address] [place], [place] [address]  Osmolality     Status: Abnormal   Collection Time: 07/10/23  9:20 AM  Result Value Ref Range   Osmolality 306 (H) 275 - 295 mOsm/kg    Comment: REPEATED TO VERIFY Performed at [facility], [address], [place], [place] [address]   Glucose, capillary     Status: Abnormal   Collection Time: 07/10/23 11:02 AM  Result Value Ref Range   Glucose-Capillary 133  (H) 70 - 99 mg/dL    Comment: Glucose reference range applies only to samples taken after fasting for at least 8 hours.  Heparin level (unfractionated)     Status: Abnormal   Collection Time: 07/10/23 12:24 PM  Result Value Ref Range   Heparin Unfractionated <0.10 (L) 0.30 - 0.70 IU/mL    Comment: (NOTE) The clinical reportable range upper limit is being lowered to >1.10 to align with the FDA approved guidance for the current laboratory assay.  If heparin results are below expected values, and patient dosage has  been confirmed, suggest follow up testing of antithrombin III levels. Performed at [facility], [address], [place], [place] [address]   CBC     Status: Abnormal   Collection Time: 07/10/23 12:24 PM  Result Value Ref Range   WBC 14.3 (H) 4.0 - 10.5 K/uL   RBC 5.08 4.22 - 5.81 MIL/uL   Hemoglobin 10.6 (L) 13.0 - 17.0 g/dL   HCT 78.4 (L) 69.6 - 29.5 %   MCV 72.8 (L) 80.0 - 100.0 fL   MCH 20.9 (L) 26.0 - 34.0 pg   MCHC 28.6 (L) 30.0 - 36.0 g/dL   RDW 28.4 (H) 13.2 - 44.0 %   Platelets 269 150 - 400 K/uL   nRBC 2.2 (H) 0.0 - 0.2 %    Comment: Performed at [facility], [address] [place], [place] [address]  Comprehensive metabolic panel     Status: Abnormal   Collection Time: 07/10/23 12:24 PM  Result Value Ref Range   Sodium 146 (H) 135 - 145 mmol/L   Potassium 7.2 (HH) 3.5 - 5.1 mmol/L    Comment: CRITICAL RESULT CALLED TO, READ BACK BY AND VERIFIED WITH NO AVAILBALE RN. PATIENT WAS TRANSFERRED OUT OF [facility] SYSTEM    Chloride 108 98 - 111 mmol/L   CO2 22 22 - 32 mmol/L   Glucose, Bld 49 (L) 70 - 99 mg/dL    Comment: Glucose reference range applies only to samples taken after fasting for at least 8 hours.   BUN 21 (H) 6 - 20 mg/dL   Creatinine, Ser 5.36 (H) 0.61 - 1.24 mg/dL   Calcium 64.4 (HH) 8.9 - 10.3 mg/dL    Comment: CRITICAL RESULT CALLED TO, READ BACK BY AND VERIFIED  WITH NO AVAILBALE RN. PATIENT WAS  TRANSFERRED OUT OF [facility] SYSTEM    Total Protein 6.2 (L) 6.5 - 8.1 g/dL   Albumin 3.1 (L) 3.5 - 5.0 g/dL   AST 161 (H) 15 - 41 U/L   ALT 95 (H) 0 - 44 U/L   Alkaline Phosphatase 86 38 - 126 U/L   Total Bilirubin 2.7 (H) 0.0 - 1.2 mg/dL   GFR, Estimated 59 (L) >60 mL/min    Comment: (NOTE) Calculated using the CKD-EPI Creatinine Equation (2021)    Anion gap 16 (H) 5 - 15    Comment: Performed at [facility], [address] [place], [place] [address]  Brain natriuretic peptide     Status: Abnormal   Collection Time: 07/10/23 12:24 PM  Result Value Ref Range   B Natriuretic Peptide 980.5 (H) 0.0 - 100.0 pg/mL    Comment: Performed at [facility], [address] [place], [place] [address]  Protime-INR     Status: Abnormal   Collection Time: 07/10/23 12:24 PM  Result Value Ref Range   Prothrombin Time 31.8 (H) 11.4 - 15.2 seconds   INR 3.1 (H) 0.8 - 1.2    Comment: (NOTE) INR goal varies based on device and disease states. Performed at [facility], [address], [place], [place] [address]   Troponin I (High Sensitivity)     Status: Abnormal   Collection Time: 07/10/23 12:24 PM  Result Value Ref Range   Troponin I (High Sensitivity) 263 (HH) <18 ng/L    Comment: CRITICAL RESULT CALLED TO, READ BACK BY AND VERIFIED WITH NO AVAILBALE RN. PATIENT WAS TRANSFERRED OUT OF [facility] SYSTEM (NOTE) Elevated high sensitivity troponin I (hsTnI) values and significant  changes across serial measurements may suggest ACS but many other  chronic and acute conditions are known to elevate hsTnI results.  Refer to the "Links" section for chest pain algorithms and additional  guidance. Performed at [facility], [address] [place], [place] [address]   Magnesium     Status: Abnormal   Collection Time: 07/10/23 12:24 PM  Result Value Ref Range   Magnesium 3.3 (H) 1.7 - 2.4 mg/dL    Comment: Performed at [facility], [address] [place], [place] [address]  Phosphorus     Status: Abnormal   Collection Time: 07/10/23 12:24 PM  Result Value Ref Range   Phosphorus 8.3 (H) 2.5 - 4.6 mg/dL    Comment: Performed at [facility], [address], [place], [place] [address]  Blood gas, arterial     Status: Abnormal   Collection Time: 07/10/23 12:45 PM  Result Value Ref Range   FIO2 100 %   Delivery systems VENTILATOR    Mode PRESSURE REGULATED VOLUME CONTROL    MECHVT 610 mL   RATE 18 resp/min   PEEP 5 cm H20   pH, Arterial 7.12 (LL) 7.35 - 7.45    Comment: CRITICAL RESULT CALLED TO, READ BACK BY AND VERIFIED WITH: RN E POLLET AT 1307 07/10/23 CRUICKSHANK A    pCO2 arterial 49 (H) 32 - 48 mmHg   pO2, Arterial 157 (H) 83 - 108 mmHg   Bicarbonate 15.9 (L) 20.0 - 28.0 mmol/L   Acid-base deficit 13.4 (H) 0.0 - 2.0 mmol/L   O2 Saturation 99.5 %   Patient temperature 37.0    Collection site A-LINE    Drawn by [id number]     Comment: Performed at  [facility], [address], [place], [place] [address]  Prepare RBC (crossmatch)     Status: None   Collection Time: 07/10/23 12:55 PM  Result Value Ref Range   Order Confirmation      ORDER PROCESSED BY BLOOD BANK Performed at [facility], [address] [place], [place] [address]   I-STAT 7, (LYTES, BLD GAS, ICA, H+H)     Status: Abnormal   Collection Time: 07/10/23  1:10 PM  Result Value Ref Range   pH, Arterial 7.385 7.35 - 7.45   pCO2 arterial 35.3 32 - 48 mmHg   pO2, Arterial 215 (H) 83 - 108 mmHg   Bicarbonate 22.3 20.0 - 28.0 mmol/L   TCO2 24 22 - 32 mmol/L   O2 Saturation 100 %   Acid-base deficit 4.0 (H) 0.0 - 2.0 mmol/L   Sodium 141 135 - 145 mmol/L   Potassium 7.6 (HH) 3.5 - 5.1 mmol/L   Calcium, Ion 1.36 1.15 - 1.40 mmol/L   HCT 33.0 (L) 39.0 - 52.0 %   Hemoglobin 11.2 (L) 13.0 - 17.0 g/dL   Patient temperature 09.8 F    Sample type ARTERIAL   Type and screen  [facility]     Status: None   Collection Time: 07/10/23  1:30 PM  Result Value Ref Range   ABO/RH(D) O POS    Antibody Screen NEG    Sample Expiration      07/13/2023,2359 Performed at [facility], [address], [place], [place] [address]    Unit Number [id number]    Blood Component Type RBC, LR IRR    Unit division 00    [facility]    Unit tag comment EMERGENCY RELEASE    Transfusion Status OK TO TRANSFUSE    Crossmatch Result PENDING    Unit Number [id number]    Blood Component Type RBC LR PHER2    Unit division 00    Status of Unit REL FROM [facility]    Unit tag comment EMERGENCY RELEASE    Transfusion Status OK TO TRANSFUSE    Crossmatch Result PENDING   Type and screen [facility]     Status: None (Preliminary result)   Collection Time: 07/10/23  1:55 PM  Result Value Ref Range   ABO/RH(D) O POS    Antibody Screen NEG    Sample Expiration 07/13/2023,2359    Unit Number [id number]    Blood Component Type RED CELLS,LR    Unit division 00    Status of Unit ISSUED    Unit tag comment VERBAL ORDERS PER DR DR Merry Lofty    Transfusion Status OK TO TRANSFUSE    Crossmatch Result COMPATIBLE    Unit Number [id number]    Blood Component Type RED CELLS,LR    Unit division 00    Status of Unit ISSUED    Unit tag comment VERBAL ORDERS PER DR DR Merry Lofty    Transfusion Status OK TO TRANSFUSE    Crossmatch Result COMPATIBLE    Unit Number [id number]    Blood Component Type RED CELLS,LR    Unit division 00    Status of Unit ISSUED    Unit tag comment VERBAL ORDERS PER DR DR Merry Lofty    Transfusion Status OK TO TRANSFUSE    Crossmatch Result COMPATIBLE    Unit Number [id number]    Blood Component Type RED CELLS,LR  Unit division 00    Status of Unit ISSUED    Unit tag comment VERBAL ORDERS PER DR DR Merry Lofty    Transfusion Status OK TO TRANSFUSE     Crossmatch Result COMPATIBLE   I-STAT 7, (LYTES, BLD GAS, ICA, H+H)     Status: Abnormal   Collection Time: 07/10/23  1:58 PM  Result Value Ref Range   pH, Arterial 7.165 (LL) 7.35 - 7.45   pCO2 arterial 48.0 32 - 48 mmHg   pO2, Arterial 312 (H) 83 - 108 mmHg   Bicarbonate 17.3 (L) 20.0 - 28.0 mmol/L   TCO2 19 (L) 22 - 32 mmol/L   O2 Saturation 100 %   Acid-base deficit 11.0 (H) 0.0 - 2.0 mmol/L   Sodium 137 135 - 145 mmol/L   Potassium 6.5 (HH) 3.5 - 5.1 mmol/L   Calcium, Ion 1.47 (H) 1.15 - 1.40 mmol/L   HCT 35.0 (L) 39.0 - 52.0 %   Hemoglobin 11.9 (L) 13.0 - 17.0 g/dL   Sample type ARTERIAL    Comment NOTIFIED PHYSICIAN   POCT Activated clotting time     Status: None   Collection Time: 07/10/23  1:59 PM  Result Value Ref Range   Activated Clotting Time 141 seconds    Comment: Reference range 74-137 seconds for patients not on anticoagulant therapy.  I-STAT, chem 8     Status: Abnormal   Collection Time: 07/10/23  2:03 PM  Result Value Ref Range   Sodium 138 135 - 145 mmol/L   Potassium 6.5 (HH) 3.5 - 5.1 mmol/L   Chloride 107 98 - 111 mmol/L   BUN 19 6 - 20 mg/dL   Creatinine, Ser 1.61 (H) 0.61 - 1.24 mg/dL   Glucose, Bld 096 (H) 70 - 99 mg/dL    Comment: Glucose reference range applies only to samples taken after fasting for at least 8 hours.   Calcium, Ion 1.48 (H) 1.15 - 1.40 mmol/L   TCO2 20 (L) 22 - 32 mmol/L   Hemoglobin 12.2 (L) 13.0 - 17.0 g/dL   HCT 04.5 (L) 40.9 - 81.1 %  Troponin I (High Sensitivity)     Status: Abnormal   Collection Time: 07/10/23  2:25 PM  Result Value Ref Range   Troponin I (High Sensitivity) 597 (HH) <18 ng/L    Comment: CRITICAL RESULT CALLED TO, READ BACK BY AND VERIFIED WITH E. POLLET, [id number] 07/10/23 L. KLAR (NOTE) Elevated high sensitivity troponin I (hsTnI) values and significant  changes across serial measurements may suggest ACS but many other  chronic and acute conditions are known to elevate hsTnI results.  Refer to the  "Links" section for chest pain algorithms and additional  guidance. Performed at [facility], [address] [place], [place] [address]   Comprehensive metabolic panel     Status: Abnormal   Collection Time: 07/10/23  2:25 PM  Result Value Ref Range   Sodium 136 135 - 145 mmol/L   Potassium 6.2 (H) 3.5 - 5.1 mmol/L   Chloride 101 98 - 111 mmol/L   CO2 17 (L) 22 - 32 mmol/L   Glucose, Bld 265 (H) 70 - 99 mg/dL    Comment: Glucose reference range applies only to samples taken after fasting for at least 8 hours.   BUN 20 6 - 20 mg/dL   Creatinine, Ser 3.08 (H) 0.61 - 1.24 mg/dL   Calcium 65.7 (H) 8.9 - 10.3 mg/dL    Comment: REPEATED TO VERIFY DELTA CHECK  NOTED    Total Protein 5.2 (L) 6.5 - 8.1 g/dL   Albumin 2.5 (L) 3.5 - 5.0 g/dL   AST 130 (H) 15 - 41 U/L   ALT 121 (H) 0 - 44 U/L   Alkaline Phosphatase 70 38 - 126 U/L   Total Bilirubin 2.6 (H) 0.0 - 1.2 mg/dL   GFR, Estimated 46 (L) >60 mL/min    Comment: (NOTE) Calculated using the CKD-EPI Creatinine Equation (2021)    Anion gap 18 (H) 5 - 15    Comment: Performed at [facility], [address], [place], [place] [address]  CBC     Status: Abnormal   Collection Time: 07/10/23  2:25 PM  Result Value Ref Range   WBC 23.2 (H) 4.0 - 10.5 K/uL   RBC 4.75 4.22 - 5.81 MIL/uL   Hemoglobin 10.1 (L) 13.0 - 17.0 g/dL   HCT 46.9 (L) 62.9 - 52.8 %   MCV 70.9 (L) 80.0 - 100.0 fL   MCH 21.3 (L) 26.0 - 34.0 pg   MCHC 30.0 30.0 - 36.0 g/dL   RDW 41.3 (H) 24.4 - 01.0 %   Platelets 199 150 - 400 K/uL   nRBC 1.7 (H) 0.0 - 0.2 %    Comment: Performed at [facility], [address], [place], [place] [address]  APTT     Status: Abnormal   Collection Time: 07/10/23  2:25 PM  Result Value Ref Range   aPTT 134 (H) 24 - 36 seconds    Comment:        IF BASELINE aPTT IS ELEVATED, SUGGEST PATIENT RISK ASSESSMENT BE USED TO DETERMINE APPROPRIATE ANTICOAGULANT THERAPY. Performed at [facility], [address], [place], [place] [address]   I-STAT 7, (LYTES, BLD GAS, ICA, H+H)     Status: Abnormal   Collection Time: 07/10/23  2:28 PM  Result Value Ref Range   pH, Arterial 7.158 (LL) 7.35 - 7.45   pCO2 arterial 62.6 (H) 32 - 48 mmHg   pO2, Arterial 157 (H) 83 - 108 mmHg   Bicarbonate 22.2 20.0 - 28.0 mmol/L   TCO2 24 22 - 32 mmol/L   O2 Saturation 99 %   Acid-base deficit 7.0 (H) 0.0 - 2.0 mmol/L   Sodium 140 135 - 145 mmol/L   Potassium 6.0 (H) 3.5 - 5.1 mmol/L   Calcium, Ion 1.60 (HH) 1.15 - 1.40 mmol/L   HCT 33.0 (L) 39.0 - 52.0 %   Hemoglobin 11.2 (L) 13.0 - 17.0 g/dL   Sample type ARTERIAL    Comment NOTIFIED PHYSICIAN   Prepare fresh frozen plasma     Status: None (Preliminary result)   Collection Time: 07/10/23  2:30 PM  Result Value Ref Range   Unit Number [id number]    Blood Component Type THAWED PLASMA    Unit division 00    Status of Unit ALLOCATED    Transfusion Status      OK TO TRANSFUSE Performed at [facility], [address] [place], [place] [address]    Unit Number [id number]    Blood Component Type THW PLS APHR    Unit division B0    Status of Unit ALLOCATED    Transfusion Status OK TO TRANSFUSE    Unit Number [id number]    Blood Component Type THAWED PLASMA    Unit division 00    Status of Unit ALLOCATED    Transfusion Status OK TO TRANSFUSE    Unit  Number [id number]    Blood Component Type THW PLS APHR    Unit division B0    Status of Unit ALLOCATED    Transfusion Status OK TO TRANSFUSE   I-STAT 7, (LYTES, BLD GAS, ICA, H+H)     Status: Abnormal   Collection Time: 07/10/23  2:37 PM  Result Value Ref Range   pH, Arterial 7.364 7.35 - 7.45   pCO2 arterial 33.3 32 - 48 mmHg   pO2, Arterial 191 (H) 83 - 108 mmHg   Bicarbonate 19.0 (L) 20.0 - 28.0 mmol/L   TCO2 20 (L) 22 - 32 mmol/L   O2 Saturation 100 %   Acid-base deficit 6.0 (H) 0.0 - 2.0 mmol/L   Sodium 139 135 - 145 mmol/L   Potassium 5.9 (H) 3.5 - 5.1 mmol/L   Calcium, Ion 1.44  (H) 1.15 - 1.40 mmol/L   HCT 28.0 (L) 39.0 - 52.0 %   Hemoglobin 9.5 (L) 13.0 - 17.0 g/dL   Sample type ARTERIAL   CG4 I-STAT (Lactic acid)     Status: Abnormal   Collection Time: 07/10/23  2:46 PM  Result Value Ref Range   Lactic Acid, Venous 8.8 (HH) 0.5 - 1.9 mmol/L   Comment NOTIFIED PHYSICIAN   POCT Activated clotting time     Status: None   Collection Time: 07/10/23  2:47 PM  Result Value Ref Range   Activated Clotting Time 164 seconds    Comment: Reference range 74-137 seconds for patients not on anticoagulant therapy.  I-STAT, chem 8     Status: Abnormal   Collection Time: 07/10/23  3:01 PM  Result Value Ref Range   Sodium 140 135 - 145 mmol/L   Potassium 5.3 (H) 3.5 - 5.1 mmol/L   Chloride 105 98 - 111 mmol/L   BUN 21 (H) 6 - 20 mg/dL   Creatinine, Ser 5.62 (H) 0.61 - 1.24 mg/dL   Glucose, Bld 130 (H) 70 - 99 mg/dL    Comment: Glucose reference range applies only to samples taken after fasting for at least 8 hours.   Calcium, Ion 1.40 1.15 - 1.40 mmol/L   TCO2 19 (L) 22 - 32 mmol/L   Hemoglobin 9.9 (L) 13.0 - 17.0 g/dL   HCT 86.5 (L) 78.4 - 69.6 %  POCT Activated clotting time     Status: None   Collection Time: 07/10/23  3:02 PM  Result Value Ref Range   Activated Clotting Time 227 seconds    Comment: Reference range 74-137 seconds for patients not on anticoagulant therapy.  I-STAT 7, (LYTES, BLD GAS, ICA, H+H)     Status: Abnormal   Collection Time: 07/10/23  3:21 PM  Result Value Ref Range   pH, Arterial 7.511 (H) 7.35 - 7.45   pCO2 arterial 21.5 (L) 32 - 48 mmHg   pO2, Arterial 204 (H) 83 - 108 mmHg   Bicarbonate 17.6 (L) 20.0 - 28.0 mmol/L   TCO2 18 (L) 22 - 32 mmol/L   O2 Saturation 100 %   Acid-base deficit 5.0 (H) 0.0 - 2.0 mmol/L   Sodium 140 135 - 145 mmol/L   Potassium 5.1 3.5 - 5.1 mmol/L   Calcium, Ion 1.30 1.15 - 1.40 mmol/L   HCT 29.0 (L) 39.0 - 52.0 %   Hemoglobin 9.9 (L) 13.0 - 17.0 g/dL   Patient temperature 29.5 C    Sample type ARTERIAL    I-STAT 7, (LYTES, BLD GAS, ICA, H+H)     Status: Abnormal   Collection  Time: 07/10/23  3:53 PM  Result Value Ref Range   pH, Arterial 7.490 (H) 7.35 - 7.45   pCO2 arterial 24.3 (L) 32 - 48 mmHg   pO2, Arterial 455 (H) 83 - 108 mmHg   Bicarbonate 18.8 (L) 20.0 - 28.0 mmol/L   TCO2 20 (L) 22 - 32 mmol/L   O2 Saturation 100 %   Acid-base deficit 4.0 (H) 0.0 - 2.0 mmol/L   Sodium 142 135 - 145 mmol/L   Potassium 4.5 3.5 - 5.1 mmol/L   Calcium, Ion 1.34 1.15 - 1.40 mmol/L   HCT 30.0 (L) 39.0 - 52.0 %   Hemoglobin 10.2 (L) 13.0 - 17.0 g/dL   Patient temperature 04.5 C    Sample type ARTERIAL   POCT Activated clotting time     Status: None   Collection Time: 07/10/23  3:59 PM  Result Value Ref Range   Activated Clotting Time 181 seconds    Comment: Reference range 74-137 seconds for patients not on anticoagulant therapy.    CT Angio Chest PE W/Cm &/Or Wo Cm Addendum Date: 07/09/2023 ADDENDUM REPORT: 07/09/2023 22:12 ADDENDUM: These results were called by telephone at the time of interpretation on 07/09/2023 at 10:12 pm to provider Elayne Snare , who verbally acknowledged these results. Electronically Signed   By: Darliss Cheney M.D.   On: 07/09/2023 22:12   Result Date: 07/09/2023 CLINICAL DATA:  Positive D-dimer with left-sided chest pressure. EXAM: CT ANGIOGRAPHY CHEST WITH CONTRAST TECHNIQUE: Multidetector CT imaging of the chest was performed using the standard protocol during bolus administration of intravenous contrast. Multiplanar CT image reconstructions and MIPs were obtained to evaluate the vascular anatomy. RADIATION DOSE REDUCTION: This exam was performed according to the departmental dose-optimization program which includes automated exposure control, adjustment of the mA and/or kV according to patient size and/or use of iterative reconstruction technique. CONTRAST:  75mL OMNIPAQUE IOHEXOL 350 MG/ML SOLN COMPARISON:  CT angiogram chest 03/30/2022. FINDINGS: Cardiovascular: Heart  is mildly enlarged. There is no pericardial effusion. Aorta is normal in size. There segmental bilateral lower lobe pulmonary emboli. Mediastinum/Nodes: There is an enlarged left hilar lymph node measuring up to 18 mm short axis, new from prior. Enlarged right hilar lymph node is new measuring 13 mm. Enlarged subcarinal lymph node measures up to 2 cm short axis, new from prior. There are numerous new nonenlarged paratracheal lymph nodes. Visualized esophagus and thyroid gland are within normal limits. Lungs/Pleura: There some patchy ground-glass opacities in the inferior left lower lobe. There is no pleural effusion or pneumothorax. Upper Abdomen: No acute abnormality. Musculoskeletal: No chest wall abnormality. No acute or significant osseous findings. Review of the MIP images confirms the above findings. IMPRESSION: 1. Segmental bilateral lower lobe pulmonary emboli. No evidence for right heart strain. 2. Patchy ground-glass opacities in the inferior left lower lobe worrisome for infarct. 3. New mediastinal and bilateral hilar lymphadenopathy. Findings may be reactive, but neoplasm is not excluded. Electronically Signed: By: Darliss Cheney M.D. On: 07/09/2023 22:06   DG Chest Port 1 View Result Date: 07/09/2023 CLINICAL DATA:  Left-sided chest pain for several days, initial encounter EXAM: PORTABLE CHEST 1 VIEW COMPARISON:  03/20/2023 FINDINGS: Cardiac shadow is mildly prominent but accentuated by the portable technique. The lungs are well aerated bilaterally. No focal infiltrate or effusion is noted. No acute bony abnormality is seen. IMPRESSION: No acute abnormality noted. Electronically Signed   By: Alcide Clever M.D.   On: 07/09/2023 19:48    Review of Systems- unable  to be obtained due to intubated/ sedated Blood pressure (!) 128/105, pulse (!) 49, temperature 97.8 F (36.6 C), temperature source Oral, resp. rate (!) 30, height 6\' 4"  (1.93 m), weight 97.5 kg, SpO2 100%. Physical Exam-- examined at [facility]  in ED Critically ill appearing man lying in bed  Bradycardic in 30s Synchronous with vent, CTA. Pplat 22. Abd soft, NT Normal muscle mass Skin warm, dry, no rashes   Assessment/Plan: VT then PEA cardiac arrest- not sure if PE or deteriorating cardiomyopathy with uncontrolled Afib and severe MR Severe acute HFrEF Severe MR Afib with RVR; now bradycardic post-arrest -initially paced transcutaneously with hemodynamic response; epi & NE infusions use with vasopressin added -given TNK; heparin on hold after until cath lab -transferred emergently to [facility]; cannulated for VA ECMO with impella 3.5 for vent-- P6, bicarb purge -swan placed for hemodynamics -weaned off inotropes -eventually needs mitral valve repaired pending neuro recovery; d/w TCTS -no Bblocker or CCB  Lactic acidosis due to cardiogenic shock -maintain perfusion, given bicarb psuhes to stabilize, initially used aggressive ventilation to control acidosis  Acute respiratory failure with hypoxia; worry this event was precipitated by acute cardiogenic pulmonary edema -LTVV -VAP prevention protocol -PAD protocol for sedation; fent & versed now -not a candidate for extubation yet; ok for SAT as tolerated -avoid hyperventilating now that pH is physiologic to avoid cerebral vasoconstirction  Acute anemia, potentially dilutional. Watch for bleeding-- very high risk with receiving TNK and chest compressions.  -transfuse for Hb <8 or hemodynamically significant bleeding  Hyperthyroidism -methimazole -daily TSH  Hyperkalemia -received multiple doses of insulin & d50w  -bicarb pushes  Hypoglycemia after insulin -dextrose pushes, Q1h accuchecks  AKI, likely cardiorenal  hyperphosphatemia -maintain adequate perfusion -con't foley -strict I/O  Shock liver -maintain adequate perfusion, unload LV, swan to help manage drips  Critical care time at [facility], in the cath lab, cardiac ICU. Managed bradycardia, shock, respiratory  failure, hyperkalemia, acidosis, anemia for 3.5 hours. Titrated vasopressors, vent, treated hyperK+, pRBC administration.     Steffanie Dunn Date: 07/10/2023 Time: 4:41 PM

## 2023-07-10 NOTE — Consult Note (Signed)
 Cardiology Consultation   Patient ID: Aaron [name] MRN: [id number]; DOB: 1986/03/06  Admit date: 07/09/2023 Date of Consult: 07/10/2023  PCP:  Patient, No Pcp Per   [facility] Providers Cardiologist:  Christell Constant, MD    Patient Profile:   Aaron [name] is a 38 y.o. male with a hx of paroxysmal atrial fibrillation, medication noncompliance, chronic HFpEF, severe MR, hyperthyroidism who is being seen 07/10/2023 for the evaluation of atrial flutter at the request of Dr. Jacqulyn Bath.  History of Present Illness:   Mr. Aaron [name] is a 38 year old male with above medical history. Per chart review, patient was previously admitted in 10/2021 with NSTEMI. He had presented complaining of chest pain that radiated to the back, found to have elevated troponins with peak of 11,000. He underwent echocardiogram 10/20/21 that showed mileaflet MVP with probable mild RM, EF 55-60% with no regional wall motion abnormalities, mild LVH, normal RV function. LHC on 10/22/21 showed no angiographically significant coronary artery disease, normal LVEDP. HE underwent cMRI that showed LVEF 49%, small apical transmural LGE consistent with scar. Possibly associated with an embolic event. With his hyperthyroidism, there was concern for high output heart failure as well. He was started on aspirin and a beta blocker, also started on methimazole. He was lost to cardiology follow up following that admission   He was again admitted in 03/2023 after he presented complaining of shortness of breath. Found to be in rapid atrial fibrillation. He was started on diltiazem and heparin drips and converted to NSR. Echocardiogram 03/19/22 showed EF 50%, no regional wall motion abnormalities, mild LVH, mildly reduced RV function, mild elevated PA systolic pressure, severe LA dilation. There was severe mitral valve regurgitation with bileaflet mitral valve prolapse. During that admission, patient also noted to have uncontrolled  hyperthyroidism secondary to Graves' disease. He was started back on methimazole.   He was seen by Dr. Izora Ribas as an outpatient on 03/27/23. At that time, patient was feeling unwell with fatigue, shortness of breath, occasional nausea. He also had palpitations/racing heart beat. He was started on spironolactone 25 mg daily. Remained on metoprolol. He was also started on eliquis. Patient was suppose to be seen in follow up in 04/2023 to discuss TEE, but he canceled the appointment.   Patient presented to the ED on 07/09/23 complaining of chest pressure that radiated into the back and left arm. Negative for COVID, flu, RSV. hsTn 64>56. EK showed atrial flutter with HR 156 BPM,  nonspecific IVCD. TSH <0.010 and free T4 2/15.   D-Dimer was elevated, so he underwent CTA chest that showed segmental bilateral lower lobe pulmonary emboli, no evidence of right heart strain. There was also patchy ground-glass opacities in the inferior left lower lobe concerning for infarct, and new mediastinal and bilateral hilar lymphadenopathy that could be reactive vs neoplastic. He was started on IV heparin and admitted to the internal medicine service. Cardiology consulted for atrial flutter   On interview, patient reports that he came to the hospital because he developed chest pain. Pain had been going on for 4 days prior to his presentation. Pain located on the left side of his chest, radiated to his neck and left arm. He also felt short of breath and had palpitations. He has not been taking his medications. Believes the the medications made him "feel bad". He has been nauseous for the past week and vomited when I was in the room.   Past Medical History:  Diagnosis Date  Medical history non-contributory     Past Surgical History:  Procedure Laterality Date   LEFT HEART CATH AND CORONARY ANGIOGRAPHY N/A 10/22/2021   Procedure: LEFT HEART CATH AND CORONARY ANGIOGRAPHY;  Surgeon: Yvonne Kendall, MD;  Location: [facility]  INVASIVE CV LAB;  Service: Cardiovascular;  Laterality: N/A;   NO PAST SURGERIES        Inpatient Medications: Scheduled Meds:  Chlorhexidine Gluconate Cloth  6 each Topical Daily   methimazole  10 mg Oral Q8H   metoprolol tartrate  100 mg Oral BID   sodium chloride flush  3 mL Intravenous Q12H   sodium zirconium cyclosilicate  10 g Oral Daily   spironolactone  25 mg Oral Daily   Continuous Infusions:  diltiazem (CARDIZEM) infusion 10 mg/hr (07/10/23 0811)   heparin 2,000 Units/hr (07/10/23 0854)   PRN Meds: acetaminophen **OR** acetaminophen, bisacodyl, HYDROcodone-acetaminophen, metoprolol tartrate, morphine injection, mouth rinse, prochlorperazine, senna-docusate  Allergies:   No Known Allergies  Social History:   Social History   Socioeconomic History   Marital status: Single    Spouse name: Not on file   Number of children: Not on file   Years of education: Not on file   Highest education level: Not on file  Occupational History   Not on file  Tobacco Use   Smoking status: Never   Smokeless tobacco: Never  Vaping Use   Vaping status: Never Used  Substance and Sexual Activity   Alcohol use: No   Drug use: Yes    Frequency: 3.0 times per week    Types: Marijuana   Sexual activity: Never  Other Topics Concern   Not on file  Social History Narrative   Not on file   Social Drivers of Health   Financial Resource Strain: Medium Risk (04/01/2023)   Overall Financial Resource Strain (CARDIA)    Difficulty of Paying Living Expenses: Somewhat hard  Food Insecurity: No Food Insecurity (07/10/2023)   Hunger Vital Sign    Worried About Running Out of Food in the Last Year: Never true    Ran Out of Food in the Last Year: Never true  Transportation Needs: No Transportation Needs (07/10/2023)   PRAPARE - Transportation    Lack of Transportation (Medical): No    Lack of Transportation (Non-Medical): No  Physical Activity: Not on file  Stress: Not on file  Social  Connections: Not on file  Intimate Partner Violence: Not At Risk (07/10/2023)   Humiliation, Afraid, Rape, and Kick questionnaire    Fear of Current or Ex-Partner: No    Emotionally Abused: No    Physically Abused: No    Sexually Abused: No    Family History:    Family History  Problem Relation Age of Onset   Heart disease Other      ROS:  Please see the history of present illness.   All other ROS reviewed and negative.     Physical Exam/Data:   Vitals:   07/10/23 0600 07/10/23 0700 07/10/23 0758 07/10/23 0800  BP:  (!) 111/56  137/73  Pulse: 70 65 (!) 116 (!) 126  Resp: (!) 27 (!) 23  12  Temp:   97.8 F (36.6 C)   TempSrc:   Oral   SpO2: 96% 97%  (!) 88%  Weight:      Height:        Intake/Output Summary (Last 24 hours) at 07/10/2023 0914 Last data filed at 07/10/2023 0811 Gross per 24 hour  Intake 1050.6 ml  Output --  Net 1050.6 ml      07/09/2023    6:38 PM 03/27/2023    3:36 PM 03/20/2023   11:00 PM  Last 3 Weights  Weight (lbs) 215 lb 210 lb 196 lb 9.6 oz  Weight (kg) 97.523 kg 95.255 kg 89.177 kg     Body mass index is 26.17 kg/m.  General:  Ill appearing, middle aged male. Laying in the bed with head elevated. No acute distress  HEENT: normal Neck: no JVD Vascular: Radial pulses 2+ bilaterally Cardiac:  normal S1, S2; regular rhythm, tachycardic. Grade 2/6 systolic murmur  Lungs:  clear to auscultation bilaterally, no wheezing, rhonchi or rales. Normal WOB on room air  Abd: soft, nontender  Ext: no edema in BLE  Musculoskeletal:  No deformities  Skin: warm and dry  Neuro:   no focal abnormalities noted Psych:  Normal affect    Telemetry:  Telemetry was personally reviewed and demonstrates:  Atrial flutter, HR ranges from 110s-140s   Relevant CV Studies: Cardiac Studies & Procedures   ______________________________________________________________________________________________ CARDIAC CATHETERIZATION  CARDIAC CATHETERIZATION  10/22/2021  Narrative Conclusions: No angiographically significant coronary artery disease. Normal left ventricular filling pressure (LVEDP ~15 mmHg). Question left ventricular outflow versus intracavitary gradient (~45 mmHg).  Recommendations: No coronary artery disease to explain elevated troponin.  Agree with plan for cardiac MRI tomorrow to assess for myocarditis and hypertrophic cardiomyopathy. Primary prevention of coronary artery disease.  Yvonne Kendall, MD [facility]  Findings Coronary Findings Diagnostic  Dominance: Right  Left Main Vessel is large. Vessel is angiographically normal.  Left Anterior Descending Vessel is large. Vessel is angiographically normal.  First Diagonal Branch Vessel is large in size.  Ramus Intermedius Vessel is small. Vessel is angiographically normal.  Left Circumflex Vessel is large. Vessel is angiographically normal.  First Obtuse Marginal Branch Vessel is large in size.  Second Obtuse Marginal Branch Vessel is moderate in size.  Third Obtuse Marginal Branch Vessel is moderate in size.  Fourth Obtuse Marginal Branch Vessel is small in size.  Right Coronary Artery Vessel is large. Vessel is angiographically normal.  Right Posterior Descending Artery Vessel is large in size.  Right Posterior Atrioventricular Artery Vessel is large in size.  First Right Posterolateral Branch Vessel is small in size.  Second Right Posterolateral Branch Vessel is moderate in size.  Third Right Posterolateral Branch Vessel is moderate in size.  Intervention  No interventions have been documented.     ECHOCARDIOGRAM  ECHOCARDIOGRAM COMPLETE 03/20/2023  Narrative ECHOCARDIOGRAM REPORT    Patient Name:   RENARDO CHEATUM Date of Exam: 03/20/2023 Medical Rec #:  [id number]    Height:       76.0 in Accession #:    [id number]   Weight:       205.0 lb Date of Birth:  Dec 20, 1985    BSA:          2.239 m Patient Age:    36 years      BP:           141/85 mmHg Patient Gender: M            HR:           72 bpm. Exam Location:  Inpatient  Procedure: 2D Echo, Cardiac Doppler and Color Doppler  Indications:    Atrial Fibrillation  History:        Patient has prior history of Echocardiogram examinations, most recent 04/01/2022. Arrythmias:Atrial Flutter; Risk Factors:Hypertension.  Sonographer:    Karma Ganja Referring  Phys: [id number] MIR M [facility]  IMPRESSIONS   1. Left ventricular ejection fraction, by estimation, is 50%. The left ventricle has low normal function. The left ventricle has no regional wall motion abnormalities. There is mild left ventricular hypertrophy. Left ventricular diastolic parameters are indeterminate. 2. Right ventricular systolic function is mildly reduced. The right ventricular size is normal. There is mildly elevated pulmonary artery systolic pressure. The estimated right ventricular systolic pressure is 37.4 mmHg. 3. Left atrial size was severely dilated. 4. Right atrial size was moderately dilated. 5. Bileaflet mitral valve prolapse with posterior > anterior prolapse and severe, eccentric anteriorly directed jet of MR. The mitral valve is myxomatous. Severe mitral valve regurgitation. No evidence of mitral stenosis. 6. The aortic valve is grossly normal. Aortic valve regurgitation is not visualized. No aortic stenosis is present. 7. Mildly dilated pulmonary artery. 8. The inferior vena cava is dilated in size with >50% respiratory variability, suggesting right atrial pressure of 8 mmHg.  FINDINGS Left Ventricle: Left ventricular ejection fraction, by estimation, is 50%. The left ventricle has low normal function. The left ventricle has no regional wall motion abnormalities. The left ventricular internal cavity size was normal in size. There is mild left ventricular hypertrophy. Left ventricular diastolic parameters are indeterminate.  Right Ventricle: The right ventricular size is  normal. No increase in right ventricular wall thickness. Right ventricular systolic function is mildly reduced. There is mildly elevated pulmonary artery systolic pressure. The tricuspid regurgitant velocity is 2.71 m/s, and with an assumed right atrial pressure of 8 mmHg, the estimated right ventricular systolic pressure is 37.4 mmHg.  Left Atrium: Left atrial size was severely dilated.  Right Atrium: Right atrial size was moderately dilated.  Pericardium: There is no evidence of pericardial effusion.  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 The mitral valve is myxomatous. Severe mitral valve regurgitation. No evidence of mitral valve stenosis. MV peak gradient, 78.5 mmHg. The mean mitral valve gradient is 37.0 mmHg.  Tricuspid Valve: The tricuspid valve is normal in structure. Tricuspid valve regurgitation is mild . No evidence of tricuspid stenosis.  Aortic Valve: The aortic valve is grossly normal. Aortic valve regurgitation is not visualized. No aortic stenosis is present. Aortic valve mean gradient measures 4.0 mmHg. Aortic valve peak gradient measures 6.5 mmHg. Aortic valve area, by VTI measures 2.17 cm.  Pulmonic Valve: The pulmonic valve was normal in structure. Pulmonic valve regurgitation is trivial. No evidence of pulmonic stenosis.  Aorta: The aortic root is normal in size and structure.  Pulmonary Artery: The pulmonary artery is mildly dilated.  Venous: The inferior vena cava is dilated in size with greater than 50% respiratory variability, suggesting right atrial pressure of 8 mmHg.  IAS/Shunts: No atrial level shunt detected by color flow Doppler.   LEFT VENTRICLE PLAX 2D LVIDd:         5.40 cm   Diastology LVIDs:         3.80 cm   LV e' medial:    9.00 cm/s LV PW:         1.20 cm   LV E/e' medial:  17.9 LV IVS:        1.30 cm   LV e' lateral:   15.77 cm/s LVOT diam:      2.00 cm   LV E/e' lateral: 10.2 LV SV:         40 LV SV Index:   18 LVOT Area:  3.14 cm   RIGHT VENTRICLE             IVC RV Basal diam:  4.50 cm     IVC diam: 2.50 cm RV S prime:     12.07 cm/s TAPSE (M-mode): 2.4 cm  LEFT ATRIUM              Index         RIGHT ATRIUM           Index LA diam:        4.40 cm  1.97 cm/m    RA Area:     27.00 cm LA Vol (A2C):   285.0 ml 127.31 ml/m  RA Volume:   91.40 ml  40.83 ml/m LA Vol (A4C):   115.0 ml 51.37 ml/m LA Biplane Vol: 183.0 ml 81.75 ml/m AORTIC VALVE AV Area (Vmax):    2.30 cm AV Area (Vmean):   2.09 cm AV Area (VTI):     2.17 cm AV Vmax:           127.00 cm/s AV Vmean:          94.400 cm/s AV VTI:            0.186 m AV Peak Grad:      6.5 mmHg AV Mean Grad:      4.0 mmHg LVOT Vmax:         92.93 cm/s LVOT Vmean:        62.900 cm/s LVOT VTI:          0.129 m LVOT/AV VTI ratio: 0.69  AORTA Ao Root diam: 3.00 cm Ao Asc diam:  2.80 cm  MITRAL VALVE                TRICUSPID VALVE MV Area (PHT): 4.33 cm     TR Peak grad:   29.4 mmHg MV Area VTI:   0.49 cm     TR Vmax:        271.00 cm/s MV Peak grad:  78.5 mmHg MV Mean grad:  37.0 mmHg    SHUNTS MV Vmax:       4.43 m/s     Systemic VTI:  0.13 m MV Vmean:      272.0 cm/s   Systemic Diam: 2.00 cm MV Decel Time: 175 msec MV E velocity: 161.00 cm/s  Weston Brass MD Electronically signed by Weston Brass MD Signature Date/Time: 03/20/2023/10:41:56 PM    Final        CARDIAC MRI  MR CARDIAC MORPHOLOGY W WO CONTRAST 10/23/2021  Narrative CLINICAL DATA:  Clinical question of NSTEMI Study assumes BSA of 2.15 m2.  EXAM: CARDIAC MRI  TECHNIQUE: The patient was scanned on a 1.5 Tesla GE magnet. A dedicated cardiac coil was used. Functional imaging was done using Fiesta sequences. 2,3, and 4 chamber views were done to assess for RWMA's. Modified Simpson's rule using a short axis stack was used to calculate an ejection fraction on a dedicated work  [employer]. The patient received 10 cc of Gadavist. After 10 minutes inversion recovery sequences were used to assess for infiltration and scar tissue.  CONTRAST:  10 cc  of Gadavist  FINDINGS: 1. Mildly increased left ventricular size, with LVEDD 57 mm, and LVEDVi 107 mL/m2.  Mild asymmetric septal hypertrophy, with intraventricular septal thickness of 12 mm, posterior wall thickness of 7 mm, and myocardial mass index of 82 g/m2.  Mildly decreased left ventricular systolic function (LVEF =49%). There is apical  inferior hypokinesis.  Left ventricular parametric mapping notable for normal T2.  There is late gadolinium enhancement in the left ventricular myocardium: Transmural apical septal LGE.  2.  Mildly increased right ventricular size with RVEDVI 99 mL/m2.  Normal right ventricular thickness.  Mildly reduced right ventricular systolic function (RVEF =46%). There are no regional wall motion abnormalities or aneurysms.  3.  Normal left and right atrial size.  4. Normal size of the aortic root, ascending aorta and pulmonary artery.  5. Valve assessment:  Aortic Valve: Tri-leaflet aortic valve. Regurgitation fraction 3%, Mean gradient 1 mmHg. There is no significant regurgitation or stenosis.  Pulmonic Valve: Regurgitation fraction 8%, Mean gradient < 1 mmHg. There is no significant regurgitation or stenosis.  Tricuspid Valve: Regurgitation fraction 27%. Mild to moderate tricuspid regurgitation.  Mitral Valve: Regurgitation fraction 16%. Mild mitral regurgitation.  6.  Normal pericardium.  No pericardial effusion.  7. Grossly, no extracardiac findings. Recommended dedicated study if concerned for non-cardiac pathology.  IMPRESSION: LVEF 49%  Small apical transmural LGE consistent with scar.  Riley Lam MD   Electronically Signed By: Riley Lam M.D. On: 10/23/2021  21:41   ______________________________________________________________________________________________       Laboratory Data:  High Sensitivity Troponin:   Recent Labs  Lab 07/09/23 1851 07/09/23 2047  TROPONINIHS 64* 56*     Chemistry Recent Labs  Lab 07/09/23 1851 07/10/23 0644  NA 136 130*  K 3.9 5.4*  CL 107 101  CO2 20* 16*  GLUCOSE 99 165*  BUN 11 17  CREATININE 0.81 0.93  CALCIUM 8.7* 8.4*  GFRNONAA >60 >60  ANIONGAP 9 13    Recent Labs  Lab 07/09/23 1934  PROT 7.0  ALBUMIN 3.6  AST 19  ALT 15  ALKPHOS 84  BILITOT 1.7*   Lipids No results for input(s): "CHOL", "TRIG", "HDL", "LABVLDL", "LDLCALC", "CHOLHDL" in the last 168 hours.  Hematology Recent Labs  Lab 07/09/23 1851 07/10/23 0644  WBC 9.2 14.7*  RBC 5.36 5.77  HGB 11.1* 12.0*  HCT 37.3* 42.5  MCV 69.6* 73.7*  MCH 20.7* 20.8*  MCHC 29.8* 28.2*  RDW 15.7* 16.0*  PLT 344 421*   Thyroid  Recent Labs  Lab 07/09/23 1934  TSH <0.010*  FREET4 2.15*    BNP Recent Labs  Lab 07/09/23 1934  BNP 703.7*    DDimer  Recent Labs  Lab 07/09/23 1934  DDIMER 1.83*     Radiology/Studies:  CT Angio Chest PE W/Cm &/Or Wo Cm Addendum Date: 07/09/2023 ADDENDUM REPORT: 07/09/2023 22:12 ADDENDUM: These results were called by telephone at the time of interpretation on 07/09/2023 at 10:12 pm to provider Elayne Snare , who verbally acknowledged these results. Electronically Signed   By: Darliss Cheney M.D.   On: 07/09/2023 22:12   Result Date: 07/09/2023 CLINICAL DATA:  Positive D-dimer with left-sided chest pressure. EXAM: CT ANGIOGRAPHY CHEST WITH CONTRAST TECHNIQUE: Multidetector CT imaging of the chest was performed using the standard protocol during bolus administration of intravenous contrast. Multiplanar CT image reconstructions and MIPs were obtained to evaluate the vascular anatomy. RADIATION DOSE REDUCTION: This exam was performed according to the departmental dose-optimization program  which includes automated exposure control, adjustment of the mA and/or kV according to patient size and/or use of iterative reconstruction technique. CONTRAST:  75mL OMNIPAQUE IOHEXOL 350 MG/ML SOLN COMPARISON:  CT angiogram chest 03/30/2022. FINDINGS: Cardiovascular: Heart is mildly enlarged. There is no pericardial effusion. Aorta is normal in size. There segmental bilateral lower lobe pulmonary emboli.  Mediastinum/Nodes: There is an enlarged left hilar lymph node measuring up to 18 mm short axis, new from prior. Enlarged right hilar lymph node is new measuring 13 mm. Enlarged subcarinal lymph node measures up to 2 cm short axis, new from prior. There are numerous new nonenlarged paratracheal lymph nodes. Visualized esophagus and thyroid gland are within normal limits. Lungs/Pleura: There some patchy ground-glass opacities in the inferior left lower lobe. There is no pleural effusion or pneumothorax. Upper Abdomen: No acute abnormality. Musculoskeletal: No chest wall abnormality. No acute or significant osseous findings. Review of the MIP images confirms the above findings. IMPRESSION: 1. Segmental bilateral lower lobe pulmonary emboli. No evidence for right heart strain. 2. Patchy ground-glass opacities in the inferior left lower lobe worrisome for infarct. 3. New mediastinal and bilateral hilar lymphadenopathy. Findings may be reactive, but neoplasm is not excluded. Electronically Signed: By: Darliss Cheney M.D. On: 07/09/2023 22:06   DG Chest Port 1 View Result Date: 07/09/2023 CLINICAL DATA:  Left-sided chest pain for several days, initial encounter EXAM: PORTABLE CHEST 1 VIEW COMPARISON:  03/20/2023 FINDINGS: Cardiac shadow is mildly prominent but accentuated by the portable technique. The lungs are well aerated bilaterally. No focal infiltrate or effusion is noted. No acute bony abnormality is seen. IMPRESSION: No acute abnormality noted. Electronically Signed   By: Alcide Clever M.D.   On: 07/09/2023 19:48      Assessment and Plan:   Atrial Flutter with RVR  Paroxysmal Atrial Fibrillation  - Patient has a known history of PAF, has been noncompliant with eliquis  - Now admitted with bilateral PE, uncontrolled hyperthyroidism. Found to be in atrial flutter with HR in the 140s - On IV heparin, IV diltiazem, PO metoprolol tartate 100 mg BID  - Per review of telemetry- patient remains in atrial flutter with HR in the 110s-140s  - Suspect that his tachycardia is driven in part by his uncontrolled hyperthyroidism. He also has severe MR with known severe left atrial dilation and moderate right atrial dilation - With patient's poor compliant with eliquis, he is not the ideal candidate for cardioversion. Also would prefer to not cardiovert when he is hyperthyroid and has bilateral PE. However, this may be needed if he remains difficult to rate control. Would need TEE guided DCCV, TEE would also help assess mitral valve  - BP stable, we will titrate BB. Currently on the max dose of dilt  - Continue IV diltiazem  - With hyperthyroidism, transition from metoprolol to propanolol 80 mg q6hours  - Repeat echocardiogram pending  - Continue IV heparin   Severe MR / MVP  - echocardiogram from 03/2023 noted severe mitral valve regurgitation with bileaflet mitral valve prolapse. There is also severe LAE - Likely contributing to atrial fib/flutter - He is currently euvolemic on exam  - Patient will ultimately need TEE. As above, if patient needs cardioversion could possibly be done this admission. Will discuss with MD   Otherwise per primary  - Bilateral lower lobe segmental PE with left lower lobe infarct  - Hyperthyroidism- untreated due to medication non adherence    Risk Assessment/Risk Scores:   CHA2DS2-VASc Score = 2   This indicates a 2.2% annual risk of stroke. The patient's score is based upon: CHF History: 1 HTN History: 1 Diabetes History: 0 Stroke History: 0 Vascular Disease History:  0 Age Score: 0 Gender Score: 0     For questions or updates, please contact [facility] Please consult [email or web address] for contact info under  Signed, Jonita Albee, PA-C  07/10/2023 9:14 AM

## 2023-07-10 NOTE — Progress Notes (Signed)
   07/10/23 1445  Spiritual Encounters  Type of Visit Follow up  Care provided to: [facility] partners present during encounter Nurse  Reason for visit Routine spiritual support  OnCall Visit No   Family arrived at [facility] from [place]. Met with fiance and future in law. Patient was in procedure. Offered spiritual care while they waited. Patient sister has not yet arrived, however she is on her way. Provided blankets. Family will have chaplain paged when others arrive or update on patient.

## 2023-07-10 NOTE — Progress Notes (Signed)
 Pt transported from 2H22 to CT and back without complications.

## 2023-07-10 NOTE — Significant Event (Signed)
 Patient was exhibiting agitation and discussing leaving AMA. Patient removed IVs and got out of bed. Due to weakness patient assisted back to bed. PCCM PA called to bedside due to change in respiratory and mental status. Patient tachypnic and increased work of breathing, perfuse diaphoresis. Due to respiratory status PCCM called anesthesia for intubation. Anesthesia at bedside but prior to induction patient went into a PEA rhythm. PEA arrest at 1142 pm. CPR initiated. Intubation at 1146 by CRNA, induction with 100 of roc. ROSC at 1150. Returned to PEA at 1154 and chest compressions initiated. TNK pushed at 1156. Aline placed. Patient placed on external pacer via Zoll at 1305. During event a total of 11 amps of epinephrine, 3 amps Calcium chloride, 3 amps of Bicarb, Magnesium 4G, Norepinephrine drip, Epinephrine drip and Fentanyl drip started. See arrest record for full medications. ECMO team at bedside for consents and transport to [facility] Patients significant other, and sister present during event.

## 2023-07-10 NOTE — Interval H&P Note (Signed)
 History and Physical Interval Note:  07/10/2023 4:41 PM  Aaron [name]  has presented today for surgery, with the diagnosis of heart failure.  The various methods of treatment have been discussed with the patient and family. After consideration of risks, benefits and other options for treatment, the patient has consented to  Procedure(s): ECMO CANNULATION (N/A) VENTRICULAR ASSIST DEVICE INSERTION (N/A) as a surgical intervention.  The patient's history has been reviewed, patient examined, no change in status, stable for surgery.  I have reviewed the patient's chart and labs.  Questions were answered to the patient's satisfaction.     Tyler Robidoux

## 2023-07-10 NOTE — Consult Note (Addendum)
 Advanced Heart Failure Team Consult Note   Primary Physician: Patient, No Pcp Per Cardiologist:  Christell Constant, MD  Reason for Consultation: cardiogenic shock s/p cardiac arrest  HPI:    Aaron [name] is seen today for evaluation of cardiogenic shock s/p cardiac arrest at the request of Dr. Vassie Loll PCCM.   Aaron [name] is a 38 y.o. AAM with PAF, NSTEMI, chronic HFpEF, medication noncompliance, severe MR, and hyperthyroidism.   HPI per chart review, patient intubated and sedated:   Admitted 6/23 with NSTEMI. Echo showed EF 55-60%, mileaflet MVP with probable mild RM, no RWMA, mild LVH, normal RV. Underwent LHC which showed no significant CAD and normal LVEDP. cMRI then showed LVEF 49%, small apical transmural LGE consistent with scar. Hyperthyroid at this time and started on BB and methimazole. Unfortunately he was lost to cardiology f/u.   Admitted 11/24 with SOB. Found to be in a fib RVR. Started on dilt and heparin and converted to NSR. Echo with EF 50%. Severe MVR with bileaflet mitral valve prolapse. 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   At f/u's he continued to not feel well. Ultimately cancelled OP DCCV.   Admitted with chest pressure and a flutter RVR. Reported chest pain that was worse on inspiration. Reported noncompliance with his home meds.  Admission labs reviewed: HsTrop 64>56, BNP 700, D-dimer 1.83, EKG with atrial flutter 156 bpm, TSH <0.010, free T4 2.15, resp panel (-). Segmental bilateral lower lobe pulmonary emboli. Started on a heparin gtt.   Today he was in atrial flutter with RVR, failed rate control with lopressor so he was started on a Cardizem gtt. As the day progressed he got agitated, took out his IVs and was going to leave AMA.  He was weak so he was assisted back to bed where he was very tachypneic, diaphoretic and had increased WOB. PCCM called to bedside for intubation where he went into pulseless VT prior to  induction of sedation. CPR started and ROSC achieved but quickly PEA arrested again. ROSC achieved ~16-18 mins. Patient reviewed TNK while CPR in progress. POCUS showed dilated LV and RV. Started on epi and levo. Transported to [facility] for ECMO cannulation.    Home Medications Prior to Admission medications   Medication Sig Start Date End Date Taking? Authorizing Provider  bismuth subsalicylate (PEPTO BISMOL) 262 MG chewable tablet Chew 524 mg by mouth as needed for indigestion.   Yes [provider]  apixaban (ELIQUIS) 5 MG TABS tablet Take 1 tablet (5 mg total) by mouth 2 (two) times daily. Patient not taking: Reported on 07/09/2023 03/27/23   Christell Constant, MD  methimazole (TAPAZOLE) 10 MG tablet Take 1 tablet (10 mg total) by mouth every 8 (eight) hours. 03/22/23 06/20/23  Rhetta Mura, MD  metoprolol tartrate 37.5 MG TABS Take 1 tablet (37.5 mg total) by mouth 2 (two) times daily. 03/22/23 06/20/23  Rhetta Mura, MD  spironolactone (ALDACTONE) 25 MG tablet Take 1 tablet (25 mg total) by mouth daily. Patient not taking: Reported on 07/09/2023 03/27/23   Christell Constant, MD    Past Medical History: Past Medical History:  Diagnosis Date   Medical history non-contributory     Past Surgical History: Past Surgical History:  Procedure Laterality Date   LEFT HEART CATH AND CORONARY ANGIOGRAPHY N/A 10/22/2021   Procedure: LEFT HEART CATH AND CORONARY ANGIOGRAPHY;  Surgeon: Yvonne Kendall, MD;  Location: [facility] INVASIVE CV LAB;  Service: Cardiovascular;  Laterality: N/A;   NO PAST SURGERIES      Family History: Family History  Problem Relation Age of Onset   Heart disease Other     Social History: Social History   Socioeconomic History   Marital status: Single    Spouse name: Not on file   Number of children: Not on file   Years of education: Not on file   Highest education level: Not on file  Occupational History   Not on file  Tobacco Use    Smoking status: Never   Smokeless tobacco: Never  Vaping Use   Vaping status: Never Used  Substance and Sexual Activity   Alcohol use: No   Drug use: Yes    Frequency: 3.0 times per week    Types: Marijuana   Sexual activity: Never  Other Topics Concern   Not on file  Social History Narrative   Not on file   Social Drivers of Health   Financial Resource Strain: Medium Risk (04/01/2023)   Overall Financial Resource Strain (CARDIA)    Difficulty of Paying Living Expenses: Somewhat hard  Food Insecurity: No Food Insecurity (07/10/2023)   Hunger Vital Sign    Worried About Running Out of Food in the Last Year: Never true    Ran Out of Food in the Last Year: Never true  Transportation Needs: No Transportation Needs (07/10/2023)   PRAPARE - Administrator, Civil Service (Medical): No    Lack of Transportation (Non-Medical): No  Physical Activity: Not on file  Stress: Not on file  Social Connections: Not on file  Allergies:  No Known Allergies  Objective:    Vital Signs:   Temp:  [97.8 F (36.6 C)-99 F (37.2 C)] 97.8 F (36.6 C) (02/27 0758) Pulse Rate:  [39-157] 49 (02/27 1220) Resp:  [10-40] 30 (02/27 1312) BP: (99-253)/(40-193) 128/105 (02/27 1234) SpO2:  [67 %-100 %] 100 % (02/27 1220) Arterial Line BP: (86-145)/(47-98) 145/98 (02/27 1312) FiO2 (%):  [100 %] 100 % (02/27 1146) Weight:  [97.5 kg] 97.5 kg (02/26 1838) Last BM Date : 07/10/23  Weight change: Filed Weights   07/09/23 1838  Weight: 97.5 kg    Intake/Output:   Intake/Output Summary (Last 24 hours) at 07/10/2023 1343 Last data filed at 07/10/2023 0811 Gross per 24 hour  Intake 1050.6 ml  Output --  Net 1050.6 ml    Physical Exam    Deferred. Patient intubated and sedated on cath lab table.   Telemetry   Atrial flutter 80s-90s  EKG    Last EKG available 2/26: Ectopic atrial tachycardia, unifocal RBBB and LPFB 146 bpm  Labs   Basic Metabolic Panel: Recent Labs  Lab  07/09/23 1851 07/10/23 0644  NA 136 130*  K 3.9 5.4*  CL 107 101  CO2 20* 16*  GLUCOSE 99 165*  BUN 11 17  CREATININE 0.81 0.93  CALCIUM 8.7* 8.4*    Liver Function Tests: Recent Labs  Lab 07/09/23 1934  AST 19  ALT 15  ALKPHOS 84  BILITOT 1.7*  PROT 7.0  ALBUMIN 3.6   Recent Labs  Lab 07/09/23 1934  LIPASE 23   No results for input(s): "AMMONIA" in the last 168 hours.  CBC: Recent Labs  Lab 07/09/23 1851 07/10/23 0644 07/10/23 1224  WBC 9.2 14.7* 14.3*  HGB 11.1* 12.0* 10.6*  HCT 37.3* 42.5 37.0*  MCV 69.6* 73.7* 72.8*  PLT 344 421* 269    Cardiac Enzymes: No results for input(s): "CKTOTAL", "CKMB", "  CKMBINDEX", "TROPONINI" in the last 168 hours.  BNP: BNP (last 3 results) Recent Labs    07/09/23 1934  BNP 703.7*    ProBNP (last 3 results) No results for input(s): "PROBNP" in the last 8760 hours.   CBG: Recent Labs  Lab 07/10/23 1102  GLUCAP 133*    Coagulation Studies: Recent Labs    07/09/23 1851 07/10/23 1224  LABPROT 17.1* 31.8*  INR 1.4* 3.1*     Imaging   CT Angio Chest PE W/Cm &/Or Wo Cm Addendum Date: 07/09/2023 ADDENDUM REPORT: 07/09/2023 22:12 ADDENDUM: These results were called by telephone at the time of interpretation on 07/09/2023 at 10:12 pm to provider Elayne Snare , who verbally acknowledged these results. Electronically Signed   By: Darliss Cheney M.D.   On: 07/09/2023 22:12   Result Date: 07/09/2023 CLINICAL DATA:  Positive D-dimer with left-sided chest pressure. EXAM: CT ANGIOGRAPHY CHEST WITH CONTRAST TECHNIQUE: Multidetector CT imaging of the chest was performed using the standard protocol during bolus administration of intravenous contrast. Multiplanar CT image reconstructions and MIPs were obtained to evaluate the vascular anatomy. RADIATION DOSE REDUCTION: This exam was performed according to the departmental dose-optimization program which includes automated exposure control, adjustment of the mA and/or kV  according to patient size and/or use of iterative reconstruction technique. CONTRAST:  75mL OMNIPAQUE IOHEXOL 350 MG/ML SOLN COMPARISON:  CT angiogram chest 03/30/2022. FINDINGS: Cardiovascular: Heart is mildly enlarged. There is no pericardial effusion. Aorta is normal in size. There segmental bilateral lower lobe pulmonary emboli. Mediastinum/Nodes: There is an enlarged left hilar lymph node measuring up to 18 mm short axis, new from prior. Enlarged right hilar lymph node is new measuring 13 mm. Enlarged subcarinal lymph node measures up to 2 cm short axis, new from prior. There are numerous new nonenlarged paratracheal lymph nodes. Visualized esophagus and thyroid gland are within normal limits. Lungs/Pleura: There some patchy ground-glass opacities in the inferior left lower lobe. There is no pleural effusion or pneumothorax. Upper Abdomen: No acute abnormality. Musculoskeletal: No chest wall abnormality. No acute or significant osseous findings. Review of the MIP images confirms the above findings. IMPRESSION: 1. Segmental bilateral lower lobe pulmonary emboli. No evidence for right heart strain. 2. Patchy ground-glass opacities in the inferior left lower lobe worrisome for infarct. 3. New mediastinal and bilateral hilar lymphadenopathy. Findings may be reactive, but neoplasm is not excluded. Electronically Signed: By: Darliss Cheney M.D. On: 07/09/2023 22:06   DG Chest Port 1 View Result Date: 07/09/2023 CLINICAL DATA:  Left-sided chest pain for several days, initial encounter EXAM: PORTABLE CHEST 1 VIEW COMPARISON:  03/20/2023 FINDINGS: Cardiac shadow is mildly prominent but accentuated by the portable technique. The lungs are well aerated bilaterally. No focal infiltrate or effusion is noted. No acute bony abnormality is seen. IMPRESSION: No acute abnormality noted. Electronically Signed   By: Alcide Clever M.D.   On: 07/09/2023 19:48     Medications:     Current Medications:  sodium chloride    Intravenous Once   calcium chloride       calcium chloride       Chlorhexidine Gluconate Cloth  6 each Topical Daily   dextrose       docusate  100 mg Per Tube BID   EPINEPHrine       etomidate       fentaNYL       fentaNYL (SUBLIMAZE) injection  50 mcg Intravenous Once   folic acid  1 mg Oral Daily  methimazole  10 mg Oral Q8H   midazolam       multivitamin with minerals  1 tablet Oral Daily   polyethylene glycol  17 g Per Tube Daily   propranolol  80 mg Oral QID   sodium chloride flush  3 mL Intravenous Q12H   sodium zirconium cyclosilicate  10 g Oral Daily   spironolactone  25 mg Oral Daily   tenecteplase       tenecteplase  50 mg Intravenous STAT   thiamine  100 mg Oral Daily   Or   thiamine  100 mg Intravenous Daily   vasopressin        Infusions:  epinephrine     fentaNYL infusion INTRAVENOUS     fentaNYL infusion INTRAVENOUS 75 mcg/hr (07/10/23 1312)   lactated ringers     midazolam     milrinone     norepinephrine (LEVOPHED) Adult infusion     propofol (DIPRIVAN) infusion        Patient Profile   Aaron [name] is a 38 y.o. AAM with PAF, NSTEMI, chronic HFpEF, medication noncompliance, severe MR, and hyperthyroidism. Admitted with atrial fib/flutter with RVR. Attempted to leave AMA. PEA/VT/VF arrested>cardiogenic shock. Transferred to [facility] for ECMO.  Assessment/Plan  PEA>VT/VFib Cardiac arrest>Cardiogenic shock - Patient trying to leave AMA and ended up having PEA/VT/VFib arrest. ROSC achieved. - Down time ~20 mins   - SCAI stage E - Plan for ECMO cannulation + impella - Trend lactic acid - Inotrope's as needed - Keep K>4, Mg >2 - Will need central access/Swann  Atrial fibrillation with RVR - 2/2 #4 - amiodarone gtt? - noncompliant with AC at home - Continue heparin gtt  PE - CTA chest that showed segmental bilateral lower lobe pulmonary emboli, no evidence of right heart strain  - S/p TNK  Hyperthyroid/Thyrotoxicosis - 2/2 noncompliance with  meds - TSH <0.010, free T4 2.15 - Will need methimazole +/- lugols. Plan to discuss with team.   Chronic HFpEF - LHC 6/23: No significant CAD - Echo 11/24 with EF 50%. Severe MVR with bileaflet mitral valve prolapse. - cMRI6/23: LVEF 49%, small apical transmural LGE consistent with scar.  - NYHA IV on admission - Will assess volume once out of cath lab - GDMT limited with shock - Update echo - Not candidate for advanced therapies with noncompliance.   Acute respiratory failure - Vent management per PCCM - Resp panel (-)  Severe MR - Echo 11/24:  Severe MVR with bileaflet mitral valve prolapse. - update echo - Consult structural team  Hyperkalemia - K up to 7.6 - s/p dex, CaGlu and IV insulin - follow BMET  CRITICAL CARE Performed by: Alen Bleacher  Total critical care time: 25 minutes  Critical care time was exclusive of separately billable procedures and treating other patients.  Critical care was necessary to treat or prevent imminent or life-threatening deterioration.  Critical care was time spent personally by me on the following activities: development of treatment plan with patient and/or surrogate as well as nursing, discussions with consultants, evaluation of patient's response to treatment, examination of patient, obtaining history from patient or surrogate, ordering and performing treatments and interventions, ordering and review of laboratory studies, ordering and review of radiographic studies, pulse oximetry and re-evaluation of patient's condition.   Length of Stay: 1  Alen Bleacher, NP  07/10/2023, 1:43 PM  Advanced Heart Failure Team Pager [phone] (M-F; 7a - 5p)  Please contact [facility] for night-coverage after hours (4p -  7a ) and weekends on [email or web address]

## 2023-07-10 NOTE — Plan of Care (Signed)
  Problem: Pain Managment: Goal: General experience of comfort will improve and/or be controlled Outcome: Progressing   Problem: Skin Integrity: Goal: Risk for impaired skin integrity will decrease Outcome: Progressing   Problem: Activity: Goal: Risk for activity intolerance will decrease Outcome: Not Progressing   Problem: Coping: Goal: Level of anxiety will decrease Outcome: Not Progressing   Problem: Safety: Goal: Ability to remain free from injury will improve Outcome: Not Progressing

## 2023-07-10 NOTE — Progress Notes (Signed)
 Transported pt on vent from cath lab to 2H22. Pt tolerated well.

## 2023-07-10 NOTE — Procedures (Signed)
 Cardiopulmonary Resuscitation Note  Aaron [name]  [id number]  02-17-1986  Date:07/10/23  Time:1:14 PM   Provider Performing:Keily Lepp V. Mikaele Stecher   Procedure: Cardiopulmonary Resuscitation [phone])  Indication(s) Loss of Pulse  Consent N/A  Anesthesia N/A   Time Out N/A   Sterile Technique Hand hygiene, gloves   Procedure Description Called to patient's room for CODE BLUE. Initial rhythm was Vfib/Vtach. Patient received high quality chest compressions for 16-18 minutes with defibrillation or cardioversion when appropriate. Epinephrine was administered every 3 minutes as directed by time Biomedical engineer. Additional pharmacologic interventions included calcium chloride, magnesium, and sodium bicarbonate. Additional procedural interventions include arterial line, intubation, and bedside echo.  Return of spontaneous circulation was achieved.  Family at bedside.   Complications/Tolerance N/A   EBL N/A   Specimen(s) N/A  Estimated time to ROSC: 16 minutes  Aaron Delis V. Vassie Loll MD

## 2023-07-10 NOTE — CV Procedure (Signed)
 ECMO NOTE:   Indication: Cardiogenic shock   Initial cannulation date: 07/10/23   ECMO type: [facility] ECMO with Impella CP vent   Dual lumen inflow/return cannula:   1) 25 FR multi-stage venous drainage RFV 2) 21 FR arterial return LCFA 3) 6FR L SFA distal perfusion cathtere 4) Impella CP via R CFA LV vent    Daily data:   Flow 4.9 L RPM 3620 Sweep  4L   Labs:   ABG    Component Value Date/Time   PHART 7.490 (H) 07/10/2023 1553   PCO2ART 24.3 (L) 07/10/2023 1553   PO2ART 455 (H) 07/10/2023 1553   HCO3 18.8 (L) 07/10/2023 1553   TCO2 20 (L) 07/10/2023 1553   ACIDBASEDEF 4.0 (H) 07/10/2023 1553   O2SAT 100 07/10/2023 1553    Hgb 10.1 Platelets 199 LDH pending PTT pending Lactic acid 8.8   Plan:  Stabilize with EC-pella support Wean pressors as tolerated If mental status intact will plan wean to Impella 5.5 with subsequent MV repair    Arvilla Meres, MD  5:16 PM

## 2023-07-10 NOTE — Progress Notes (Signed)
 Arrived at bedside with CPR in progress. Initial rhythm pulseless V. tach followed by PEA.  Regained ROSC for 2 minutes and then again PEA, total time to ROSC was about 16 to 18 minutes .  Given TNK while CPR in progress Bedside echo showed dilated LV and RV, bradycardic A-flutter rhythm. Intubated, vent settings adjusted. ABG showing metabolic plus respiratory acidosis, respiratory rate increased and bicarb given Family updated at bedside. Epinephrine and levo drips titrated to maintain blood pressure Right radial arterial line placed ECMO team contacted -plan to transport to 2 heart for possible ECMO.  My critical care time independent of procedure was 70 minutes  Aaron Nile V. Vassie Loll MD

## 2023-07-10 NOTE — Telephone Encounter (Signed)
Patient Product/process development scientist completed.    The patient is insured through [employer].     Ran test claim for Eliquis 5 mg and the current 30 day co-pay is $4.00.  Ran test claim for Xarelto 20 mg and the current 30 day co-pay is $4.00.  This test claim was processed through [facility]- copay amounts may vary at other pharmacies due to pharmacy/plan contracts, or as the patient moves through the different stages of their insurance plan.     Roland Earl, CPHT Pharmacy Technician III Certified Patient Advocate [facility] Patient Advocate Team Direct Number: [phone]  Fax: [phone]

## 2023-07-10 NOTE — Anesthesia Procedure Notes (Signed)
 Procedure Name: Intubation Date/Time: 07/10/2023 11:46 AM  Performed by: Sindy Guadeloupe, CRNAPre-anesthesia Checklist: Patient identified, Emergency Drugs available, Suction available, Patient being monitored and Timeout performed Patient Re-evaluated:Patient Re-evaluated prior to induction Oxygen Delivery Method: Ambu bag Preoxygenation: Pre-oxygenation with 100% oxygen Induction Type: IV induction Ventilation: Oral airway inserted - appropriate to patient size and Mask ventilation without difficulty Laryngoscope Size: Glidescope and 4 Grade View: Grade I Tube type: Oral Tube size: 8.0 mm Number of attempts: 1 Airway Equipment and Method: Stylet Placement Confirmation: ETT inserted through vocal cords under direct vision, breath sounds checked- equal and bilateral, CO2 detector and positive ETCO2 Secured at: 23 cm Tube secured with: Tape Dental Injury: Teeth and Oropharynx as per pre-operative assessment  Comments: Pt coded upon arrival to ICU, CPR initiated, mask ventilated with oral airway in place, upon 1st pulse check Glidescope intubation with grade 1 view, CO2 verified with CO2 detector, RT secured ett in place and connected pt to ventilator. Dr Ace Gins at bedside throughout intubation.

## 2023-07-10 NOTE — Progress Notes (Signed)
 [facility] brought 1 infusion bag of midazolam (versed) 100mg /128mL with patient during transfer. Bag was unwrapped from the overwrap, but unused. Entirety of infusion was wasted into the Stericycle on 2H by [facility] and charge RN for the evening ([place]).  Calton Dach, PharmD, BCCCP Clinical Pharmacist 07/10/2023 8:43 PM

## 2023-07-10 NOTE — Progress Notes (Signed)
 Endotracheal tube advanced 2 cm to 29cm at the lip per MD order.

## 2023-07-10 NOTE — Progress Notes (Signed)
 PHARMACY - ANTICOAGULATION CONSULT NOTE  Pharmacy Consult for bivalirudin Indication:  ECMO, Impella  No Known Allergies  Patient Measurements: Height: 6\' 4"  (193 cm) Weight: 97.5 kg (215 lb) IBW/kg (Calculated) : 86.8 Heparin Dosing Weight: n/a  Vital Signs: Temp: 97.8 F (36.6 C) (02/27 0758) Temp Source: Oral (02/27 0758) BP: 128/105 (02/27 1234) Pulse Rate: 49 (02/27 1220)  Labs: Recent Labs    07/09/23 1851 07/09/23 2047 07/10/23 0644 07/10/23 1224 07/10/23 1310 07/10/23 1403 07/10/23 1425 07/10/23 1428 07/10/23 1501 07/10/23 1521 07/10/23 1553  HGB 11.1*  --  12.0* 10.6*   < > 12.2* 10.1*   < > 9.9* 9.9* 10.2*  HCT 37.3*  --  42.5 37.0*   < > 36.0* 33.7*   < > 29.0* 29.0* 30.0*  PLT 344  --  421* 269  --   --  199  --   --   --   --   APTT 36  --   --   --   --   --  134*  --   --   --   --   LABPROT 17.1*  --   --  31.8*  --   --   --   --   --   --   --   INR 1.4*  --   --  3.1*  --   --   --   --   --   --   --   HEPARINUNFRC <0.10*  --  0.23* <0.10*  --   --   --   --   --   --   --   CREATININE 0.81  --  0.93 1.53*  --  1.70* 1.88*  --  1.80*  --   --   TROPONINIHS 64* 56*  --  263*  --   --  597*  --   --   --   --    < > = values in this interval not displayed.    Estimated Creatinine Clearance: 68.3 mL/min (A) (by C-G formula based on SCr of 1.8 mg/dL (H)).   Medical History: Past Medical History:  Diagnosis Date   Medical history non-contributory     Assessment: 38 yo M with bilateral PE s/p TNK (2/27 @1156 ) now on [place] ECMO + Impella. Pharmacy consulted for bivalirudin for anticoagulation.   Last aPTT 134, >200 - supratherapeutic Hgb 10.0, Plt 199   Goal of Therapy:  aPTT 50-70 seconds Monitor platelets by anticoagulation protocol: Yes   Plan:  STAT repeat aPTT Plan to start bivalirudin at 0.15 mg/kg/hr once aPTT falls within dosing range aPTT q4h until therapeutic then transition to q12h F/u CBC, LDH   Calton Dach, PharmD,  BCCCP Clinical Pharmacist 07/10/2023 4:54 PM

## 2023-07-10 NOTE — CV Procedure (Signed)
   Brief TEE note.   Indication: Shock/Impella CP placement  Operator: Porcia Morganti  Patient already intubated/sedated on cath lab table   TEE probe inserted without difficulty  Prior to ECMO initiation chest wall echo revealed  EF 35-40% with flail posterior MV leaflet and severe eccentric MR  Post ECMO TEE findings  Severely reduced LV/RV function with LVEF < 15% and spontaneous echo contrast in LV Mild bileaflet prolapse of the mitral valve with mild MR Trileaflet aortic valve being traversed by Impella catheter Good position of Impella catheter in LV No LAA clot No PFO or ASD  See full report in [facility].  Arvilla Meres, MD  5:24 PM

## 2023-07-10 NOTE — Progress Notes (Addendum)
 PROGRESS NOTE    Aaron [name]  OZH:[id number] DOB: Nov 02, 1985 DOA: 07/09/2023 PCP: Patient, No Pcp Per   Brief Narrative:  HPI: Aaron [name] is a 38 y.o. male with medical history significant for PAF not adherent to Eliquis, chronic HFpEF, severe mitral regurgitation, hyperthyroidism who presented to the ED for evaluation of chest pain and dyspnea.   Patient reports 4 days of left-sided chest pain radiating straight to his back.  Pain is worse with deep inspiration.  He has had some radiation down his left arm with numbness/tingling sensation.  Last week he had a cough with some vomiting and diarrhea.  He noted experiencing pain in his left thigh area a few days ago.  He denies any recent travel.   Patient admits to not taking his medications as prescribed, he reports he did not like the way his Eliquis or thyroid medications made him feel.   ED Course  Labs/Imaging on admission: I have personally reviewed following labs and imaging studies.   Initial vitals showed BP 138/108, pulse 157, RR 18, temp 98.5 F, SpO2 100% on room air.   Labs showed WBC 9.2, hemoglobin 11.1, platelets 344,000, sodium 136, potassium 3.9, bicarb 20, BUN 11, creatinine 0.81, serum glucose 99, troponin 64 > 56.  BNP 703.7.  Lipase 23.  D-dimer 1.83.   TSH <0.010, free T42.15.  SARS-CoV-2, influenza, RSV PCR negative.   CTA chest showed segmental bilateral lower lobe pulmonary emboli.  No evidence for right heart strain.  Patchy groundglass opacities in the inferior left lower lobe worrisome for infarct.  New mediastinal and bilateral hilar lymphadenopathy which may be reactive but neoplasm is not excluded.   Patient was given 1 L LR, IV morphine 4 mg, IV metoprolol 5 mg and started on heparin drip.  EDP discussed with PCCM who felt patient was stable for stepdown admission under hospitalist service.  The hospitalist service was consulted to admit.  Assessment & Plan:   Principal Problem:   Bilateral pulmonary  embolism (HCC) Active Problems:   Paroxysmal atrial fibrillation (HCC)   Hyperthyroidism   Chronic heart failure with preserved ejection fraction (HFpEF) (HCC)   Severe mitral regurgitation  Acute hypoxic respective her secondary to acute bilateral lower lobe segmental PE with left lower lobe pulmonary infarct: Seen on CTA chest without evidence of right heart strain.  Prescribed Eliquis for A-fib but not adherent.  Was not hypoxic upon admission but became hypoxic and now requiring 4 L of oxygen.  Will continue IV heparin for now, encouraged incentive spirometry.   Paroxysmal atrial fibrillation: Sinus tachycardia on admission with rate 140s.  Tachycardia driven by PE and untreated hyperthyroidism due to medication nonadherence.  Eventually flipped into atrial fibrillation, uncontrolled with oral beta-blocker, started on Cardizem overnight.  He appears to be in atrial flutter on the monitor during my visit.  Will continue Cardizem that but increase Lopressor to 100 mg p.o. twice daily.  Echo pending.  I have consulted cardiology for assistance as well.  Chronic HFpEF with severe mitral regurgitation: -Restarted Lopressor and Aldactone.  Echo pending.     Hyperthyroidism: Untreated due to medication nonadherence.  Contributing to tachycardia.  TSH <0.010, free T4 2.15.  Restarted on methimazole 10 mg p.o. 3 times daily and Lopressor.  Hyperkalemia: 5.4.  Will order Lokelma.  Recheck later.  Monitor on telemetry.  Hyponatremia: 130.  Check urine and serum osmolality and urine spot sodium.  Check sodium level later today and tomorrow morning.  Hyperglycemia: 165 blood  sugar this morning.  Check hemoglobin A1c.  Brief addendum: I was paged 11:04 AM that patient was agitated and per nurse, he was likely withdrawing.  Haldol and CIWA protocol was ordered.  Apparently, CODE BLUE was called around 1135-1140.  Please review all the charting by rapid response nurse and PCCM.  PCCM responded.ran the  code.  Eventually patient was intubated and transferred to [facility] for ECMO.  DVT prophylaxis: Heparin   Code Status: Full Code  Family Communication:  None present at bedside.  Plan of care discussed with patient in length and he/she verbalized understanding and agreed with it.  Status is: Inpatient Remains inpatient appropriate because: Atrial flutter on IV Cardizem and IV heparin   Estimated body mass index is 26.17 kg/m as calculated from the following:   Height as of this encounter: 6\' 4"  (1.93 m).   Weight as of this encounter: 97.5 kg.    Nutritional Assessment: Body mass index is 26.17 kg/m.[name] Seen by dietician.  I agree with the assessment and plan as outlined below: Nutrition Status:        . Skin Assessment: I have examined the patient's skin and I agree with the wound assessment as performed by the wound care RN as outlined below:    Consultants:  Cardiology  Procedures:  None  Antimicrobials:  Anti-infectives (From admission, onward)    None         Subjective: Patient seen and examined, he has no complaints.  No shortness of breath or chest pain at the moment.  Objective: Vitals:   07/10/23 0600 07/10/23 0700 07/10/23 0758 07/10/23 0800  BP:  (!) 111/56  137/73  Pulse: 70 65 (!) 116 (!) 126  Resp: (!) 27 (!) 23  12  Temp:   97.8 F (36.6 C)   TempSrc:   Oral   SpO2: 96% 97%  (!) 88%  Weight:      Height:        Intake/Output Summary (Last 24 hours) at 07/10/2023 1610 Last data filed at 07/10/2023 9604 Gross per 24 hour  Intake 1050.6 ml  Output --  Net 1050.6 ml   Filed Weights   07/09/23 1838  Weight: 97.5 kg    Examination:  General exam: Appears calm and comfortable  Respiratory system: Clear to auscultation but poor inspiratory effort due to pain. Cardiovascular system: S1 & S2 heard, RRR. No JVD, murmurs, rubs, gallops or clicks. No pedal edema. Gastrointestinal system: Abdomen is nondistended, soft and nontender. No  organomegaly or masses felt. Normal bowel sounds heard. Central nervous system: Alert and oriented. No focal neurological deficits. Extremities: Symmetric 5 x 5 power. Skin: No rashes, lesions or ulcers Psychiatry: Judgement and insight appear normal. Mood & affect appropriate.    Data Reviewed: I have personally reviewed following labs and imaging studies  CBC: Recent Labs  Lab 07/09/23 1851 07/10/23 0644  WBC 9.2 14.7*  HGB 11.1* 12.0*  HCT 37.3* 42.5  MCV 69.6* 73.7*  PLT 344 421*   Basic Metabolic Panel: Recent Labs  Lab 07/09/23 1851 07/10/23 0644  NA 136 130*  K 3.9 5.4*  CL 107 101  CO2 20* 16*  GLUCOSE 99 165*  BUN 11 17  CREATININE 0.81 0.93  CALCIUM 8.7* 8.4*   GFR: Estimated Creatinine Clearance: 132.2 mL/min (by C-G formula based on SCr of 0.93 mg/dL). Liver Function Tests: Recent Labs  Lab 07/09/23 1934  AST 19  ALT 15  ALKPHOS 84  BILITOT 1.7*  PROT 7.0  ALBUMIN 3.6   Recent Labs  Lab 07/09/23 1934  LIPASE 23   No results for input(s): "AMMONIA" in the last 168 hours. Coagulation Profile: Recent Labs  Lab 07/09/23 1851  INR 1.4*   Cardiac Enzymes: No results for input(s): "CKTOTAL", "CKMB", "CKMBINDEX", "TROPONINI" in the last 168 hours. BNP (last 3 results) No results for input(s): "PROBNP" in the last 8760 hours. HbA1C: No results for input(s): "HGBA1C" in the last 72 hours. CBG: No results for input(s): "GLUCAP" in the last 168 hours. Lipid Profile: No results for input(s): "CHOL", "HDL", "LDLCALC", "TRIG", "CHOLHDL", "LDLDIRECT" in the last 72 hours. Thyroid Function Tests: Recent Labs    07/09/23 1934  TSH <0.010*  FREET4 2.15*   Anemia Panel: No results for input(s): "VITAMINB12", "FOLATE", "FERRITIN", "TIBC", "IRON", "RETICCTPCT" in the last 72 hours. Sepsis Labs: No results for input(s): "PROCALCITON", "LATICACIDVEN" in the last 168 hours.  Recent Results (from the past 240 hours)  Resp panel by RT-PCR (RSV, Flu  A&B, Covid) Anterior Nasal Swab     Status: None   Collection Time: 07/09/23  7:26 PM   Specimen: Anterior Nasal Swab  Result Value Ref Range Status   SARS Coronavirus 2 by RT PCR NEGATIVE NEGATIVE Final    Comment: (NOTE) SARS-CoV-2 target nucleic acids are NOT DETECTED.  The SARS-CoV-2 RNA is generally detectable in upper respiratory specimens during the acute phase of infection. The lowest concentration of SARS-CoV-2 viral copies this assay can detect is 138 copies/mL. A negative result does not preclude SARS-Cov-2 infection and should not be used as the sole basis for treatment or other patient management decisions. A negative result may occur with  improper specimen collection/handling, submission of specimen other than nasopharyngeal swab, presence of viral mutation(s) within the areas targeted by this assay, and inadequate number of viral copies(<138 copies/mL). A negative result must be combined with clinical observations, patient history, and epidemiological information. The expected result is Negative.  Fact Sheet for Patients:  BloggerCourse.com  Fact Sheet for Healthcare Providers:  SeriousBroker.it  This test is no t yet approved or cleared by the [place] FDA and  has been authorized for detection and/or diagnosis of SARS-CoV-2 by FDA under an Emergency Use Authorization (EUA). This EUA will remain  in effect (meaning this test can be used) for the duration of the COVID-19 declaration under Section 564(b)(1) of the Act, 21 U.S.C.section 360bbb-3(b)(1), unless the authorization is terminated  or revoked sooner.       Influenza A by PCR NEGATIVE NEGATIVE Final   Influenza B by PCR NEGATIVE NEGATIVE Final    Comment: (NOTE) The Xpert Xpress SARS-CoV-2/FLU/RSV plus assay is intended as an aid in the diagnosis of influenza from Nasopharyngeal swab specimens and should not be used as a sole basis for treatment.  Nasal washings and aspirates are unacceptable for Xpert Xpress SARS-CoV-2/FLU/RSV testing.  Fact Sheet for Patients: BloggerCourse.com  Fact Sheet for Healthcare Providers: SeriousBroker.it  This test is not yet approved or cleared by the [place] FDA and has been authorized for detection and/or diagnosis of SARS-CoV-2 by FDA under an Emergency Use Authorization (EUA). This EUA will remain in effect (meaning this test can be used) for the duration of the COVID-19 declaration under Section 564(b)(1) of the Act, 21 U.S.C. section 360bbb-3(b)(1), unless the authorization is terminated or revoked.     Resp Syncytial Virus by PCR NEGATIVE NEGATIVE Final    Comment: (NOTE) Fact Sheet for Patients: BloggerCourse.com  Fact Sheet for Healthcare Providers: SeriousBroker.it  This test is not yet approved or cleared by the [place] and has been authorized for detection and/or diagnosis of SARS-CoV-2 by FDA under an Emergency Use Authorization (EUA). This EUA will remain in effect (meaning this test can be used) for the duration of the COVID-19 declaration under Section 564(b)(1) of the Act, 21 U.S.C. section 360bbb-3(b)(1), unless the authorization is terminated or revoked.  Performed at [facility], [address], [place], [place] [address]   MRSA Next Gen by PCR, Nasal     Status: None   Collection Time: 07/10/23  2:14 AM   Specimen: Nasal Mucosa; Nasal Swab  Result Value Ref Range Status   MRSA by PCR Next Gen NOT DETECTED NOT DETECTED Final    Comment: (NOTE) The GeneXpert MRSA Assay (FDA approved for NASAL specimens only), is one component of a comprehensive MRSA colonization surveillance program. It is not intended to diagnose MRSA infection nor to guide or monitor treatment for MRSA infections. Test performance is not FDA approved in patients  less than 55 years old. Performed at [facility], [address], [place], [place] [address]      Radiology Studies: CT Angio Chest PE W/Cm &/Or Wo Cm Addendum Date: 07/09/2023 ADDENDUM REPORT: 07/09/2023 22:12 ADDENDUM: These results were called by telephone at the time of interpretation on 07/09/2023 at 10:12 pm to provider Elayne Snare , who verbally acknowledged these results. Electronically Signed   By: Darliss Cheney M.D.   On: 07/09/2023 22:12   Result Date: 07/09/2023 CLINICAL DATA:  Positive D-dimer with left-sided chest pressure. EXAM: CT ANGIOGRAPHY CHEST WITH CONTRAST TECHNIQUE: Multidetector CT imaging of the chest was performed using the standard protocol during bolus administration of intravenous contrast. Multiplanar CT image reconstructions and MIPs were obtained to evaluate the vascular anatomy. RADIATION DOSE REDUCTION: This exam was performed according to the departmental dose-optimization program which includes automated exposure control, adjustment of the mA and/or kV according to patient size and/or use of iterative reconstruction technique. CONTRAST:  75mL OMNIPAQUE IOHEXOL 350 MG/ML SOLN COMPARISON:  CT angiogram chest 03/30/2022. FINDINGS: Cardiovascular: Heart is mildly enlarged. There is no pericardial effusion. Aorta is normal in size. There segmental bilateral lower lobe pulmonary emboli. Mediastinum/Nodes: There is an enlarged left hilar lymph node measuring up to 18 mm short axis, new from prior. Enlarged right hilar lymph node is new measuring 13 mm. Enlarged subcarinal lymph node measures up to 2 cm short axis, new from prior. There are numerous new nonenlarged paratracheal lymph nodes. Visualized esophagus and thyroid gland are within normal limits. Lungs/Pleura: There some patchy ground-glass opacities in the inferior left lower lobe. There is no pleural effusion or pneumothorax. Upper Abdomen: No acute abnormality. Musculoskeletal: No chest wall  abnormality. No acute or significant osseous findings. Review of the MIP images confirms the above findings. IMPRESSION: 1. Segmental bilateral lower lobe pulmonary emboli. No evidence for right heart strain. 2. Patchy ground-glass opacities in the inferior left lower lobe worrisome for infarct. 3. New mediastinal and bilateral hilar lymphadenopathy. Findings may be reactive, but neoplasm is not excluded. Electronically Signed: By: Darliss Cheney M.D. On: 07/09/2023 22:06   DG Chest Port 1 View Result Date: 07/09/2023 CLINICAL DATA:  Left-sided chest pain for several days, initial encounter EXAM: PORTABLE CHEST 1 VIEW COMPARISON:  03/20/2023 FINDINGS: Cardiac shadow is mildly prominent but accentuated by the portable technique. The lungs are well aerated bilaterally. No focal infiltrate or effusion is noted. No acute bony abnormality is  seen. IMPRESSION: No acute abnormality noted. Electronically Signed   By: Alcide Clever M.D.   On: 07/09/2023 19:48    Scheduled Meds:  Chlorhexidine Gluconate Cloth  6 each Topical Daily   methimazole  10 mg Oral Q8H   metoprolol tartrate  100 mg Oral BID   sodium chloride flush  3 mL Intravenous Q12H   spironolactone  25 mg Oral Daily   Continuous Infusions:  diltiazem (CARDIZEM) infusion 10 mg/hr (07/10/23 0811)   heparin 1,800 Units/hr (07/10/23 0811)     LOS: 1 day   Hughie Closs, MD [facility]  07/10/2023, 8:22 AM   *Please note that this is a verbal dictation therefore any spelling or grammatical errors are due to the "Dragon Medical One" system interpretation.  Please page via Amion and do not message via secure chat for urgent patient care matters. Secure chat can be used for non urgent patient care matters.  How to contact the [facility] Attending or Consulting provider 7A - 7P or covering provider during after hours 7P -7A, for this patient?  Check the care team in [facility] and look for a) attending/consulting TR[facility] provider listed and b) the [facility] team  listed. Page or secure chat 7A-7P. Log into [email or web address] and use [place]'s universal password to access. If you do not have the password, please contact the hospital operator. Locate the [facility] provider you are looking for under [facility] and page to a number that you can be directly reached. If you still have difficulty reaching the provider, please page the [facility] (Director on Call) for the Hospitalists listed on amion for assistance.

## 2023-07-10 NOTE — Transfer of Care (Signed)
 Immediate Anesthesia Transfer of Care Note  Patient: Aaron [name]  Procedure(s) Performed: ECMO CANNULATION VENTRICULAR ASSIST DEVICE INSERTION  Patient Location: ICU  Anesthesia Type:General  Level of Consciousness: Patient remains intubated per anesthesia plan  Airway & Oxygen Therapy: Patient remains intubated per anesthesia plan and Patient placed on Ventilator (see vital sign flow sheet for setting)  Post-op Assessment: Report given to RN, Post -op Vital signs reviewed and stable, and Critical patient. VSS with ECMO and Impella support. . See MAR and Vitals for details.  Post vital signs: Reviewed and stable  Last Vitals:  Vitals Value Taken Time  BP 128/107 07/10/23 1705  Temp    Pulse 62 07/10/23 1710  Resp 14 07/10/23 1710  SpO2 100 % 07/10/23 1710  Vitals shown include unfiled device data.  Last Pain:  Vitals:   07/10/23 0914  TempSrc:   PainSc: 3          Complications: There were no known notable events for this encounter.

## 2023-07-10 NOTE — Procedures (Signed)
 Arterial Catheter Insertion Procedure Note  CARL BUTNER  [id number]  1985-05-31  Date:07/10/23  Time:3:51 PM    Provider Performing: Tim Lair    Procedure: Insertion of Arterial Line (63016) with US guidance (01093)   Indication(s) Blood pressure monitoring and/or need for frequent ABGs  Consent Unable to obtain consent due to emergent nature of procedure.  Anesthesia None   Time Out Verified patient identification, verified procedure, site/side was marked, verified correct patient position, special equipment/implants available, medications/allergies/relevant history reviewed, required imaging and test results available.   Sterile Technique Maximal sterile technique including full sterile barrier drape, hand hygiene, sterile gown, sterile gloves, mask, hair covering, sterile ultrasound probe cover (if used).   Procedure Description Area of catheter insertion was cleaned with chlorhexidine and draped in sterile fashion. With real-time ultrasound guidance an arterial catheter was placed into the right radial artery.  Appropriate arterial tracings confirmed on monitor.     Complications/Tolerance None; patient tolerated the procedure well.   EBL Minimal   Specimen(s) None  Tim Lair, [place] [facility] 07/10/23 3:53 PM  Please see [email or web address] for pager details.  From 7A-7P if no response, please call [phone] After hours, please call ELink [phone]

## 2023-07-10 NOTE — Progress Notes (Signed)
  ECMO INITIATION   Patient: Aaron [name], 1986/02/13, 38 y.o. Location:   Date of Service:  07/10/2023     Time: 5:13 PM  Date of Admission: 07/09/2023 Admitting diagnosis: Bilateral pulmonary embolism (HCC)  Ht: 6\' 4"  (193 cm) Wt: 97.5 kg BSA: Body surface area is 2.29 meters squared.  Blood Type: O POS Allergies: No Known Allergies  Past medical history:  Past Medical History:  Diagnosis Date   Atrial fibrillation (HCC)    Hyperthyroidism    Mitral regurgitation    NSTEMI (non-ST elevated myocardial infarction) [facility])    Past surgical history:  Past Surgical History:  Procedure Laterality Date   LEFT HEART CATH AND CORONARY ANGIOGRAPHY N/A 10/22/2021   Procedure: LEFT HEART CATH AND CORONARY ANGIOGRAPHY;  Surgeon: Yvonne Kendall, MD;  Location: [facility] INVASIVE CV LAB;  Service: Cardiovascular;  Laterality: N/A;    Indication for ECMO: Cardiogenic Shock  ECMO was deployed at 1255 and initiated at 1426  Anticoagulation not given, pt post TNK. Cannulated for VA  and achieved initial ECMO 4.33 LPM and ECMO 2sweep .    ECMO Cannula Information     Staff Present  Primary Perfusionist Zola Button  Assisting Perfusionist/ECMO Specialist Devota Pace   Cannulating Physician Nicholes Mango MD and Theresia Bough MD   ECMO Lot Numbers  CardioHelp Console  [id number]  Oxygenator  [id number]  Tubing Pack  [id number]  ECMO Goals  Flow goal   4.5 -5.5 LPM   Anticoagulation goal      Cardiac goal    MAP > 65  Respiratory goal    O2 Sat > 88%  Other goal       ECMO Handoff  Patient Information * Age Height Weight BSA IBW BMI  37 y.o. 6\' 4"  (193 cm)  (97.5 kg Body surface area is 2.29 meters squared. No data recorded Body mass index is 26.17 kg/m.   Review History * Primary Diagnosis   Bilateral pulmonary embolism (HCC)  Prior Cardiac Arrest within 24hrs of ECMO initiation?   ECMO and MCS * Type ECMO Flow ECMO Sweep Gases    VA   4.33 LPM    2       Additional  Mechanical Support   Ventilation *    $ Ventilator Initial/Subsequent : Initial, Vent Mode: PRVC, Vt Set: 690 mL, Set Rate: 14 bmp, FiO2 (%): 80 %, I Time: 0.9 Sec(s), PEEP: 5 cmH20     Cannula Size and Locations       Drainage 25 Fr Multi stage RFV   Return 21 Fr single stage LFV    *Cannula(e) sutured and anchored, secured and dressed.   Labs and Imaging *  *Cannulation position verified via imaging on arrival to ICU. Concerns communicated to attending surgeon. Labs reviewed.   All ECMO safety checks complete. ECMO flowsheet initiated, applicable charges captured, LDA's entered/confirmed, imaging and labs verified, blood products available, and report given to [facility].

## 2023-07-10 NOTE — Progress Notes (Signed)
  Echocardiogram Echocardiogram Transesophageal has been performed.  Delcie Roch 07/10/2023, 4:32 PM

## 2023-07-10 NOTE — Progress Notes (Signed)
 PHARMACY - ANTICOAGULATION CONSULT NOTE  Pharmacy Consult for bivalirudin Indication:  ECMO, Impella  No Known Allergies  Patient Measurements: Height: 6\' 4"  (193 cm) Weight: 97.5 kg (215 lb) IBW/kg (Calculated) : 86.8 Heparin Dosing Weight: n/a  Vital Signs: Temp: 97.9 F (36.6 C) (02/27 1900) Temp Source: Core (02/27 1700) BP: 123/100 (02/27 1900) Pulse Rate: 43 (02/27 1715)  Labs: Recent Labs    07/09/23 2047 07/10/23 0644 07/10/23 1224 07/10/23 1310 07/10/23 1425 07/10/23 1428 07/10/23 1501 07/10/23 1521 07/10/23 1630 07/10/23 1708 07/10/23 1709 07/10/23 2015 07/10/23 2034 07/10/23 2211  HGB  --  12.0* 10.6*   < > 10.1*   < > 9.9*   < >  --  10.0*   < > 9.6* 10.5* 9.9*  HCT  --  42.5 37.0*   < > 33.7*   < > 29.0*   < >  --  31.3*   < > 30.1* 31.0* 29.0*  PLT  --  421* 269  --  199  --   --   --   --  199  --  210  --   --   APTT  --   --   --   --  134*  --   --   --  >200*  --   --  76*  --   --   LABPROT  --   --  31.8*  --   --   --   --   --   --  42.0*  --  36.0*  --   --   INR  --   --  3.1*  --   --   --   --   --   --  4.4*  --  3.6*  --   --   HEPARINUNFRC  --  0.23* <0.10*  --   --   --   --   --   --  0.23*  --   --   --   --   CREATININE  --  0.93 1.53*   < > 1.88*  --  1.80*  --   --  1.70*  --   --   --   --   TROPONINIHS 56*  --  263*  --  597*  --   --   --   --   --   --   --   --   --    < > = values in this interval not displayed.    Estimated Creatinine Clearance: 72.3 mL/min (A) (by C-G formula based on SCr of 1.7 mg/dL (H)).   Medical History: Past Medical History:  Diagnosis Date   Atrial fibrillation [facility])    Hyperthyroidism    Mitral regurgitation    NSTEMI (non-ST elevated myocardial infarction) [facility])     Assessment: 38 yo M with bilateral PE s/p TNK (2/27 @1156 ) now on [place] ECMO + Impella. Pharmacy consulted for bivalirudin for anticoagulation.   Last aPTT 76- supratherapeutic, downtrending Hgb 10.0, Plt 199   Goal of  Therapy:  aPTT 50-70 seconds Monitor platelets by anticoagulation protocol: Yes   Plan:  STAT repeat aPTT Plan to start bivalirudin at 0.15 mg/kg/hr once aPTT falls within dosing range aPTT q4h until therapeutic then transition to q12h F/u CBC, LDH   Calton Dach, PharmD, BCCCP Clinical Pharmacist 07/10/2023 10:35 PM

## 2023-07-10 NOTE — Hospital Course (Addendum)
 38 year old male with hypothyroidism, PAF on anticoagulation (per notes nonadherent to Eliquis), severe mitral regurgitation comes into the hospital on 2/26 with shortness of breath and admitted for acute hypoxic respiratory failure , PE,A-fib with RVR, chronic HFpEF, severe MR and hypothyroidism with TSH less than 0.01 Free T4 2.15-placed on heparin,also suspected thyrotoxicosis-placed on beta-blockers,resumed methimazole, steroids but he rapidly deteriorated 2/27 went to respiratory arrest with V-fib arrest.Underwent 40 minutes of CPR followed by ROSC.Post arrest course complicated by ATN requiring CRRT now on HD, he was also on ECMO status post decannulation on 3/8, also required Impella eventually removed 3/12.  He failed extubation x 1 due to mucous plugging, but eventually extubated 3/16.  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 Currently waiting for renal recovery and planning for rehabilitation- cont heparin gtt until HD line is sorted out- nephro to plan. Patient has been approved for CIR   Significant events: 2/26 admitted +PE 2/27 Vtac/ Vfib arrest> rosc after 40 mins, s/p TNK,  VA ecmo 3/5 salvage TEER (transcatheter edge to edge mitral valve repair) 3/7 weaned off VA 3/8 bronch for mucus plugging, VA ecmo decannulation >impella 5.5 3/12 impella removed, extubation trial 2/28 CRRT 3/13 reintubated, bronch for recurrent plugging, HD cath rewired due to malfunction 3/15 CRRT stopped, remains anuric 3/16 awake f/c, extubated 3/22 core track discontinued 3/24 HD  Consultation: Cardiology Nephrology Palliative care Vascular surgery Critical care.  Discharge diagnoses:  Primary problem:Cardiogenic shock Acute systolic CHF Severe MR VT/NSVT:  Suspected hypoxic respiratory failure drainage with segmental PE in the background of severe MR ,nodal  blockage and thyrotoxicosis-suspected downtime about 20 minutes.  Managed by CHF/HF team, S/P ECMO with  Impella, decannulated on 3/8 and Impella removed and extubated on 3/12-reintubated but eventually extubated again on 3/16 Currently doing well on room air. Initially needed pressors, currently vitals stable. Repeat echo shows improved EF at 40-45%.Continue amiodarone for now and plans to stop on DC. GDMT limited by AKI/CKD.   Acute kidney injury due to ATN Hyperkalemia Uremia: following cardiac arrest.Required CRRT,now on IHD. Nephrology managing continue HD may need few more sessions.Urine output increasing -hopefully recover soon Discussed with Dr. Malen Gauze -will need to look at his renal function and labs on Wednesday to see if he still needs hemodialysis.   Severe MR: S/p mitral transcatheter edge-to-edge repair (mTEER) on 07/16/2023 with placement of 3 clips and improvement in MR from severe to mild   Acute respiratory failure with hypoxia Pulmonary hypertension Acute PE: Hemodynamic stable.  On room air.    Currently on IV heparin DRIP > plan to switch to Eliquis once HD sorted out and if he does not need any dialysis or if it does not need to be changed to [facility] we will switch to Eliquis   Hyponatremia: Cont fluid management with HD   PAF: In NSR. Continue oral amiodarone.  On heparin.    Hyperthyroidism Possibly thyrotoxicosis: Doing well currently.  Continue methimazole.Need to check TSH in 4 weeks. Last TSH done 3/14 at 0.03   ID: previously on Vanco meropenem and micafungin, now off antibiotics.  Cultures without growth.  Remains afebrile   Normocytic anemia: Hb stable   Transaminitis: Likely shock liver.  Monitor labs intermittently  Acute small right CVA : MRI of the head on 07/24/2022 shows marked fluid to the right cerebellar and cerebral hemisphere. Suspect embolic, continue IV heparin.   Deconditioning/debility: unclear his posthospitalization needs   Stage II sacral cubitus ulcer: Noted  Severe malnutrition: Augment  diet as tolerated RD following Etiology:  acute illness Signs/Symptoms: moderate fat depletion, moderate muscle depletion Interventions: Refer to RD note for recommendations.

## 2023-07-10 NOTE — Progress Notes (Signed)
 Report has been called to RT, Brantley at [facility].

## 2023-07-10 NOTE — Progress Notes (Signed)
 This was a follow-up to a code event, initially responded to by Jacqulynn Cadet who made a spiritual care handoff at 1219 so that I could relieve her.  I met with Mr. demarr kluever and her sister, joined later by another sister and their mother, Steward Drone. I assessed their needs and worked with Water quality scientist, Denny Peon, Charity fundraiser, to facilitate support to the family and allow them to be present at bedside when able.  I engaged Steward Drone to support her faith and provided prayer and words of encouragement. I stayed on the unit [facility]) until transport to [facility]. Plan of care was for Leanora Cover or other team member to resume support once family and patient arrive at [facility].

## 2023-07-10 NOTE — Progress Notes (Signed)
     Patient Name: Aaron [name]           DOB: 02-06-1986  MRN: [id number]      Admission Date: 07/09/2023  Attending Provider: Charlsie Quest, MD  Primary Diagnosis: Bilateral pulmonary embolism (HCC)   Level of care: Stepdown   OVERNIGHT PROGRESS REPORT   Initially sinus tachycardic on admission.   Now transitioning to atrial flutter/fib, HR sustaining 140s despite IV and p.o. Lopressor. Adding on Cardizem gtt. Continue IV heparin.    [name], DNP, ACNPC- AG [facility]

## 2023-07-10 NOTE — Anesthesia Preprocedure Evaluation (Addendum)
 Anesthesia Evaluation  Patient identified by MRN, date of birth, ID band Patient unresponsive    Reviewed: Allergy & Precautions, Patient's Chart, lab work & pertinent test results, Unable to perform ROS - Chart review onlyPreop documentation limited or incomplete due to emergent nature of procedure.  Airway Mallampati: Intubated       Dental   Pulmonary neg pulmonary ROS      + intubated    Cardiovascular hypertension, + Past MI   Rhythm:Regular Rate:Normal  Echo 03/2023  1. Left ventricular ejection fraction, by estimation, is 50%. The left ventricle has low normal function. The left ventricle has no regional wall motion abnormalities. There is mild left ventricular hypertrophy. Left ventricular diastolic parameters are  indeterminate.   2. Right ventricular systolic function is mildly reduced. The right ventricular size is normal. There is mildly elevated pulmonary artery systolic pressure. The estimated right ventricular systolic pressure is 37.4 mmHg.   3. Left atrial size was severely dilated.   4. Right atrial size was moderately dilated.   5. Bileaflet mitral valve prolapse with posterior > anterior prolapse and severe, eccentric anteriorly directed jet of MR. The mitral valve is myxomatous. Severe mitral valve regurgitation. No evidence of mitral stenosis.   6. The aortic valve is grossly normal. Aortic valve regurgitation is not visualized. No aortic stenosis is present.   7. Mildly dilated pulmonary artery.   8. The inferior vena cava is dilated in size with >50% respiratory variability, suggesting right atrial pressure of 8 mmHg.    Cath 09/2021 Conclusions: 1. No angiographically significant coronary artery disease. 2. Normal left ventricular filling pressure (LVEDP ~15 mmHg). 3. Question left ventricular outflow versus intracavitary gradient (~45 mmHg).   Recommendations: 1. No coronary artery disease to explain elevated  troponin.  Agree with plan for cardiac MRI tomorrow to assess for myocarditis and hypertrophic cardiomyopathy. 2. Primary prevention of coronary artery disease.     Neuro/Psych negative neurological ROS     GI/Hepatic negative GI ROS, Neg liver ROS,,,  Endo/Other    Renal/GU negative Renal ROS     Musculoskeletal   Abdominal   Peds  Hematology negative hematology ROS (+)   Anesthesia Other Findings   Reproductive/Obstetrics                              Anesthesia Physical Anesthesia Plan  ASA: 5 and emergent  Anesthesia Plan: General   Post-op Pain Management: Minimal or no pain anticipated   Induction: Inhalational  PONV Risk Score and Plan: Treatment may vary due to age or medical condition  Airway Management Planned: Oral ETT  Additional Equipment: Arterial line  Intra-op Plan:   Post-operative Plan: Post-operative intubation/ventilation  Informed Consent: I have reviewed the patients History and Physical, chart, labs and discussed the procedure including the risks, benefits and alternatives for the proposed anesthesia with the patient or authorized representative who has indicated his/her understanding and acceptance.     Dental advisory given  Plan Discussed with: CRNA  Anesthesia Plan Comments:         Anesthesia Quick Evaluation

## 2023-07-10 NOTE — Anesthesia Postprocedure Evaluation (Signed)
 Anesthesia Post Note  Patient: Aaron [name]  Procedure(s) Performed: ECMO CANNULATION VENTRICULAR ASSIST DEVICE INSERTION     Patient location during evaluation: ICU Anesthesia Type: General Level of consciousness: sedated and patient remains intubated per anesthesia plan Pain management: pain level controlled Vital Signs Assessment: post-procedure vital signs reviewed and stable Respiratory status: patient remains intubated per anesthesia plan Anesthetic complications: no   There were no known notable events for this encounter.  Last Vitals:  Vitals:   07/10/23 1900 07/10/23 1943  BP: (!) 123/100   Pulse:    Resp: 12   Temp: 36.6 C   SpO2:  100%    Last Pain:  Vitals:   07/10/23 1700  TempSrc: Core  PainSc:                  Lewie Loron

## 2023-07-10 NOTE — Progress Notes (Addendum)
 PHARMACY - ANTICOAGULATION CONSULT NOTE  Pharmacy Consult for bivalirudin Indication:  ECMO, Impella  No Known Allergies  Patient Measurements: Height: 6\' 4"  (193 cm) Weight: 97.5 kg (215 lb) IBW/kg (Calculated) : 86.8 Heparin Dosing Weight: n/a  Vital Signs: Temp: 97.9 F (36.6 C) (02/27 1900) Temp Source: Core (02/27 1700) BP: 123/100 (02/27 1900) Pulse Rate: 43 (02/27 1715)  Labs: Recent Labs    07/09/23 2047 07/10/23 0644 07/10/23 1224 07/10/23 1310 07/10/23 1425 07/10/23 1428 07/10/23 1501 07/10/23 1521 07/10/23 1630 07/10/23 1708 07/10/23 1709 07/10/23 2015 07/10/23 2034 07/10/23 2211 07/10/23 2238  HGB  --  12.0* 10.6*   < > 10.1*   < > 9.9*   < >  --  10.0*   < > 9.6* 10.5* 9.9*  --   HCT  --  42.5 37.0*   < > 33.7*   < > 29.0*   < >  --  31.3*   < > 30.1* 31.0* 29.0*  --   PLT  --  421* 269  --  199  --   --   --   --  199  --  210  --   --   --   APTT  --   --   --   --  134*  --   --   --  >200*  --   --  76*  --   --  50*  LABPROT  --   --  31.8*  --   --   --   --   --   --  42.0*  --  36.0*  --   --   --   INR  --   --  3.1*  --   --   --   --   --   --  4.4*  --  3.6*  --   --   --   HEPARINUNFRC  --  0.23* <0.10*  --   --   --   --   --   --  0.23*  --   --   --   --   --   CREATININE  --  0.93 1.53*   < > 1.88*  --  1.80*  --   --  1.70*  --   --   --   --   --   TROPONINIHS 56*  --  263*  --  597*  --   --   --   --   --   --   --   --   --   --    < > = values in this interval not displayed.    Estimated Creatinine Clearance: 72.3 mL/min (A) (by C-G formula based on SCr of 1.7 mg/dL (H)).   Medical History: Past Medical History:  Diagnosis Date   Atrial fibrillation [facility]    Hyperthyroidism    Mitral regurgitation    NSTEMI (non-ST elevated myocardial infarction) [facility])     Assessment: 38 yo M with bilateral PE s/p TNK (2/27 @1156 ) now on [place] ECMO + Impella. Pharmacy consulted for bivalirudin for anticoagulation.   Last aPTT 50-  therapeutic, and down trending from previous aPTT to low end of goal Hgb 10.0, Plt 199   Goal of Therapy:  aPTT 50-70 seconds Monitor platelets by anticoagulation protocol: Yes   Plan:  Start bivalirudin at 0.15 mg/kg/hr once aPTT falls within dosing range (Wt. 97.5 kg) Sodium bicarb purge  aPTT q4h until  therapeutic then transition to q12h F/u CBC, LDH, fibrinogen  Arabella Merles, PharmD. Clinical Pharmacist 07/10/2023 11:27 PM    ADDENDUM: -4h aPTT returned at 111 seconds (supra-therapeutic). No signs/symptoms of bleeding per RN and drawn appropriately from opposite side of infusion. Fibrinogen slowly increasing from <60 to 67, Hgb 9, plts 185.  Goal of Therapy:  aPTT 50-70 seconds Monitor platelets by anticoagulation protocol: Yes   Plan:  Hold bivalirudin for 1 hour  Decrease bivalirudin rate to 0.05 mg/kg/hr at 0730 (Wt. 97.5 kg) Sodium bicarb purge  aPTT q4h until therapeutic then transition to q12h F/u CBC, LDH, fibrinogen  Arabella Merles, PharmD. Clinical Pharmacist 07/11/2023 6:31 AM

## 2023-07-10 NOTE — Progress Notes (Signed)
 PHARMACY - ANTICOAGULATION CONSULT NOTE  Pharmacy Consult for Heparin Indication: pulmonary embolus  No Known Allergies  Patient Measurements: Height: 6\' 4"  (193 cm) Weight: 97.5 kg (215 lb) IBW/kg (Calculated) : 86.8 Heparin Dosing Weight: actual body weight  Vital Signs: Temp: 97.9 F (36.6 C) (02/27 0422) Temp Source: Oral (02/27 0422) BP: 141/99 (02/27 0500) Pulse Rate: 70 (02/27 0600)  Labs: Recent Labs    07/09/23 1851 07/09/23 2047  HGB 11.1*  --   HCT 37.3*  --   PLT 344  --   APTT 36  --   LABPROT 17.1*  --   INR 1.4*  --   HEPARINUNFRC <0.10*  --   CREATININE 0.81  --   TROPONINIHS 64* 56*    Estimated Creatinine Clearance: 151.8 mL/min (by C-G formula based on SCr of 0.81 mg/dL).   Medical History: Past Medical History:  Diagnosis Date   Medical history non-contributory     Medications:  No recent oral anticoagulation PTA (Patient had been on Eliquis 5mg  BID for AFib however has not taken medication in the past 2-3 months) Infusions:   diltiazem (CARDIZEM) infusion 10 mg/hr (07/10/23 0811)   heparin 1,800 Units/hr (07/10/23 0981)     Assessment: 38 yr male presented on 2/26 with chest pain.  CTAngio + bilateral PE with no evidence of right heart strain.  PMH significant for hyperthyroidism, CAD, AFib (previously on Eliquis but noted patient stopped taking 2-3 months because he did not like the way it made him feel).  Today, 07/10/2023: Heparin level 0.23, increased but remains subtherapeutic on heparin 1800 units/hr CBC: Hgb up to 12, Plt up to 421 No bleeding or complications reported  Goal of Therapy:  Heparin level 0.3-0.7 units/ml Monitor platelets by anticoagulation protocol: Yes   Plan:  Give heparin 1500 units bolus IV x 1 Increase to heparin IV infusion at 2000 units/hr Heparin level 6 hours  Daily heparin level and CBC Follow up long-term anticoagulation plan   Lynann Beaver PharmD, BCPS [facility] main pharmacy  [phone] 07/10/2023 7:11 AM

## 2023-07-11 ENCOUNTER — Inpatient Hospital Stay (HOSPITAL_COMMUNITY): Payer: Medicaid Other

## 2023-07-11 ENCOUNTER — Encounter (HOSPITAL_COMMUNITY): Payer: Self-pay | Admitting: Internal Medicine

## 2023-07-11 ENCOUNTER — Encounter (HOSPITAL_COMMUNITY): Payer: Medicaid Other

## 2023-07-11 DIAGNOSIS — R569 Unspecified convulsions: Secondary | ICD-10-CM

## 2023-07-11 DIAGNOSIS — I340 Nonrheumatic mitral (valve) insufficiency: Secondary | ICD-10-CM | POA: Diagnosis not present

## 2023-07-11 DIAGNOSIS — R079 Chest pain, unspecified: Secondary | ICD-10-CM | POA: Diagnosis not present

## 2023-07-11 DIAGNOSIS — D649 Anemia, unspecified: Secondary | ICD-10-CM | POA: Diagnosis not present

## 2023-07-11 DIAGNOSIS — E875 Hyperkalemia: Secondary | ICD-10-CM | POA: Diagnosis not present

## 2023-07-11 DIAGNOSIS — R918 Other nonspecific abnormal finding of lung field: Secondary | ICD-10-CM | POA: Diagnosis not present

## 2023-07-11 DIAGNOSIS — I469 Cardiac arrest, cause unspecified: Secondary | ICD-10-CM | POA: Diagnosis not present

## 2023-07-11 DIAGNOSIS — I2699 Other pulmonary embolism without acute cor pulmonale: Secondary | ICD-10-CM | POA: Diagnosis not present

## 2023-07-11 DIAGNOSIS — I7090 Unspecified atherosclerosis: Secondary | ICD-10-CM

## 2023-07-11 DIAGNOSIS — E872 Acidosis, unspecified: Secondary | ICD-10-CM | POA: Diagnosis not present

## 2023-07-11 DIAGNOSIS — Z86711 Personal history of pulmonary embolism: Secondary | ICD-10-CM | POA: Diagnosis not present

## 2023-07-11 DIAGNOSIS — J9601 Acute respiratory failure with hypoxia: Secondary | ICD-10-CM | POA: Diagnosis not present

## 2023-07-11 DIAGNOSIS — I480 Paroxysmal atrial fibrillation: Secondary | ICD-10-CM

## 2023-07-11 DIAGNOSIS — R570 Cardiogenic shock: Secondary | ICD-10-CM | POA: Diagnosis not present

## 2023-07-11 DIAGNOSIS — N170 Acute kidney failure with tubular necrosis: Secondary | ICD-10-CM | POA: Diagnosis not present

## 2023-07-11 DIAGNOSIS — J9811 Atelectasis: Secondary | ICD-10-CM | POA: Diagnosis not present

## 2023-07-11 DIAGNOSIS — I517 Cardiomegaly: Secondary | ICD-10-CM | POA: Diagnosis not present

## 2023-07-11 DIAGNOSIS — N179 Acute kidney failure, unspecified: Secondary | ICD-10-CM | POA: Diagnosis not present

## 2023-07-11 DIAGNOSIS — R0602 Shortness of breath: Secondary | ICD-10-CM | POA: Diagnosis not present

## 2023-07-11 DIAGNOSIS — Z4682 Encounter for fitting and adjustment of non-vascular catheter: Secondary | ICD-10-CM | POA: Diagnosis not present

## 2023-07-11 LAB — POCT I-STAT 7, (LYTES, BLD GAS, ICA,H+H)
Acid-Base Excess: 0 mmol/L (ref 0.0–2.0)
Acid-Base Excess: 0 mmol/L (ref 0.0–2.0)
Acid-Base Excess: 2 mmol/L (ref 0.0–2.0)
Acid-Base Excess: 1 mmol/L (ref 0.0–2.0)
Acid-Base Excess: 0 mmol/L (ref 0.0–2.0)
Acid-Base Excess: 3 mmol/L — ABNORMAL HIGH (ref 0.0–2.0)
Acid-base deficit: 1 mmol/L (ref 0.0–2.0)
Bicarbonate: 24.9 mmol/L (ref 20.0–28.0)
Bicarbonate: 24.9 mmol/L (ref 20.0–28.0)
Bicarbonate: 25.5 mmol/L (ref 20.0–28.0)
Bicarbonate: 23.9 mmol/L (ref 20.0–28.0)
Bicarbonate: 26.8 mmol/L (ref 20.0–28.0)
Bicarbonate: 27.5 mmol/L (ref 20.0–28.0)
Bicarbonate: 29.6 mmol/L — ABNORMAL HIGH (ref 20.0–28.0)
Calcium, Ion: 1.06 mmol/L — ABNORMAL LOW (ref 1.15–1.40)
Calcium, Ion: 1.06 mmol/L — ABNORMAL LOW (ref 1.15–1.40)
Calcium, Ion: 1.11 mmol/L — ABNORMAL LOW (ref 1.15–1.40)
Calcium, Ion: 1 mmol/L — ABNORMAL LOW (ref 1.15–1.40)
Calcium, Ion: 1.03 mmol/L — ABNORMAL LOW (ref 1.15–1.40)
Calcium, Ion: 0.47 mmol/L — CL (ref 1.15–1.40)
Calcium, Ion: 1.05 mmol/L — ABNORMAL LOW (ref 1.15–1.40)
HCT: 29 % — ABNORMAL LOW (ref 39.0–52.0)
HCT: 26 % — ABNORMAL LOW (ref 39.0–52.0)
HCT: 27 % — ABNORMAL LOW (ref 39.0–52.0)
HCT: 25 % — ABNORMAL LOW (ref 39.0–52.0)
HCT: 26 % — ABNORMAL LOW (ref 39.0–52.0)
HCT: 24 % — ABNORMAL LOW (ref 39.0–52.0)
HCT: 29 % — ABNORMAL LOW (ref 39.0–52.0)
Hemoglobin: 9.9 g/dL — ABNORMAL LOW (ref 13.0–17.0)
Hemoglobin: 8.8 g/dL — ABNORMAL LOW (ref 13.0–17.0)
Hemoglobin: 9.2 g/dL — ABNORMAL LOW (ref 13.0–17.0)
Hemoglobin: 8.5 g/dL — ABNORMAL LOW (ref 13.0–17.0)
Hemoglobin: 8.8 g/dL — ABNORMAL LOW (ref 13.0–17.0)
Hemoglobin: 8.2 g/dL — ABNORMAL LOW (ref 13.0–17.0)
Hemoglobin: 9.9 g/dL — ABNORMAL LOW (ref 13.0–17.0)
O2 Saturation: 99 %
O2 Saturation: 97 %
O2 Saturation: 99 %
O2 Saturation: 99 %
O2 Saturation: 99 %
O2 Saturation: 100 %
O2 Saturation: 99 %
Patient temperature: 37.2
Patient temperature: 37.2
Patient temperature: 37
Patient temperature: 37.2
Patient temperature: 37.1
Patient temperature: 37.2
Patient temperature: 37.2
Potassium: 6.9 mmol/L (ref 3.5–5.1)
Potassium: 6.5 mmol/L (ref 3.5–5.1)
Potassium: 5.7 mmol/L — ABNORMAL HIGH (ref 3.5–5.1)
Potassium: 6.6 mmol/L (ref 3.5–5.1)
Potassium: 6.2 mmol/L — ABNORMAL HIGH (ref 3.5–5.1)
Potassium: 4.2 mmol/L (ref 3.5–5.1)
Potassium: 5.3 mmol/L — ABNORMAL HIGH (ref 3.5–5.1)
Sodium: 139 mmol/L (ref 135–145)
Sodium: 141 mmol/L (ref 135–145)
Sodium: 141 mmol/L (ref 135–145)
Sodium: 141 mmol/L (ref 135–145)
Sodium: 139 mmol/L (ref 135–145)
Sodium: 139 mmol/L (ref 135–145)
Sodium: 142 mmol/L (ref 135–145)
TCO2: 26 mmol/L (ref 22–32)
TCO2: 26 mmol/L (ref 22–32)
TCO2: 27 mmol/L (ref 22–32)
TCO2: 25 mmol/L (ref 22–32)
TCO2: 28 mmol/L (ref 22–32)
TCO2: 29 mmol/L (ref 22–32)
TCO2: 32 mmol/L (ref 22–32)
pCO2 arterial: 40.4 mmHg (ref 32–48)
pCO2 arterial: 40.8 mmHg (ref 32–48)
pCO2 arterial: 36.7 mmHg (ref 32–48)
pCO2 arterial: 40.5 mmHg (ref 32–48)
pCO2 arterial: 45.2 mmHg (ref 32–48)
pCO2 arterial: 42.8 mmHg (ref 32–48)
pCO2 arterial: 78.3 mmHg (ref 32–48)
pH, Arterial: 7.398 (ref 7.35–7.45)
pH, Arterial: 7.394 (ref 7.35–7.45)
pH, Arterial: 7.45 (ref 7.35–7.45)
pH, Arterial: 7.38 (ref 7.35–7.45)
pH, Arterial: 7.382 (ref 7.35–7.45)
pH, Arterial: 7.187 — CL (ref 7.35–7.45)
pH, Arterial: 7.416 (ref 7.35–7.45)
pO2, Arterial: 128 mmHg — ABNORMAL HIGH (ref 83–108)
pO2, Arterial: 90 mmHg (ref 83–108)
pO2, Arterial: 156 mmHg — ABNORMAL HIGH (ref 83–108)
pO2, Arterial: 125 mmHg — ABNORMAL HIGH (ref 83–108)
pO2, Arterial: 137 mmHg — ABNORMAL HIGH (ref 83–108)
pO2, Arterial: 155 mmHg — ABNORMAL HIGH (ref 83–108)
pO2, Arterial: 228 mmHg — ABNORMAL HIGH (ref 83–108)

## 2023-07-11 LAB — POCT I-STAT, CHEM 8
BUN: 53 mg/dL — ABNORMAL HIGH (ref 6–20)
BUN: 41 mg/dL — ABNORMAL HIGH (ref 6–20)
BUN: 39 mg/dL — ABNORMAL HIGH (ref 6–20)
BUN: 50 mg/dL — ABNORMAL HIGH (ref 6–20)
Calcium, Ion: 1.04 mmol/L — ABNORMAL LOW (ref 1.15–1.40)
Calcium, Ion: 0.5 mmol/L — CL (ref 1.15–1.40)
Calcium, Ion: 0.48 mmol/L — CL (ref 1.15–1.40)
Calcium, Ion: 1.08 mmol/L — ABNORMAL LOW (ref 1.15–1.40)
Chloride: 103 mmol/L (ref 98–111)
Chloride: 97 mmol/L — ABNORMAL LOW (ref 98–111)
Chloride: 100 mmol/L (ref 98–111)
Chloride: 95 mmol/L — ABNORMAL LOW (ref 98–111)
Creatinine, Ser: 3.4 mg/dL — ABNORMAL HIGH (ref 0.61–1.24)
Creatinine, Ser: 2.3 mg/dL — ABNORMAL HIGH (ref 0.61–1.24)
Creatinine, Ser: 2.1 mg/dL — ABNORMAL HIGH (ref 0.61–1.24)
Creatinine, Ser: 3.4 mg/dL — ABNORMAL HIGH (ref 0.61–1.24)
Glucose, Bld: 130 mg/dL — ABNORMAL HIGH (ref 70–99)
Glucose, Bld: 191 mg/dL — ABNORMAL HIGH (ref 70–99)
Glucose, Bld: 171 mg/dL — ABNORMAL HIGH (ref 70–99)
Glucose, Bld: 208 mg/dL — ABNORMAL HIGH (ref 70–99)
HCT: 27 % — ABNORMAL LOW (ref 39.0–52.0)
HCT: 28 % — ABNORMAL LOW (ref 39.0–52.0)
HCT: 25 % — ABNORMAL LOW (ref 39.0–52.0)
HCT: 27 % — ABNORMAL LOW (ref 39.0–52.0)
Hemoglobin: 9.2 g/dL — ABNORMAL LOW (ref 13.0–17.0)
Hemoglobin: 9.5 g/dL — ABNORMAL LOW (ref 13.0–17.0)
Hemoglobin: 8.5 g/dL — ABNORMAL LOW (ref 13.0–17.0)
Hemoglobin: 9.2 g/dL — ABNORMAL LOW (ref 13.0–17.0)
Potassium: 5.9 mmol/L — ABNORMAL HIGH (ref 3.5–5.1)
Potassium: 4.4 mmol/L (ref 3.5–5.1)
Potassium: 3.8 mmol/L (ref 3.5–5.1)
Potassium: 4.9 mmol/L (ref 3.5–5.1)
Sodium: 141 mmol/L (ref 135–145)
Sodium: 142 mmol/L (ref 135–145)
Sodium: 138 mmol/L (ref 135–145)
Sodium: 141 mmol/L (ref 135–145)
TCO2: 25 mmol/L (ref 22–32)
TCO2: 29 mmol/L (ref 22–32)
TCO2: 28 mmol/L (ref 22–32)
TCO2: 31 mmol/L (ref 22–32)

## 2023-07-11 LAB — URINALYSIS, COMPLETE (UACMP) WITH MICROSCOPIC

## 2023-07-11 LAB — CBC
HCT: 28 % — ABNORMAL LOW (ref 39.0–52.0)
HCT: 25.9 % — ABNORMAL LOW (ref 39.0–52.0)
Hemoglobin: 9 g/dL — ABNORMAL LOW (ref 13.0–17.0)
Hemoglobin: 7.9 g/dL — ABNORMAL LOW (ref 13.0–17.0)
MCH: 22.6 pg — ABNORMAL LOW (ref 26.0–34.0)
MCH: 22.5 pg — ABNORMAL LOW (ref 26.0–34.0)
MCHC: 32.1 g/dL (ref 30.0–36.0)
MCHC: 30.5 g/dL (ref 30.0–36.0)
MCV: 70.4 fL — ABNORMAL LOW (ref 80.0–100.0)
MCV: 73.8 fL — ABNORMAL LOW (ref 80.0–100.0)
Platelets: 185 10*3/uL (ref 150–400)
Platelets: 152 10*3/uL (ref 150–400)
RBC: 3.98 MIL/uL — ABNORMAL LOW (ref 4.22–5.81)
RBC: 3.51 MIL/uL — ABNORMAL LOW (ref 4.22–5.81)
RDW: 16.6 % — ABNORMAL HIGH (ref 11.5–15.5)
RDW: 17.4 % — ABNORMAL HIGH (ref 11.5–15.5)
WBC: 29.7 10*3/uL — ABNORMAL HIGH (ref 4.0–10.5)
WBC: 41.3 10*3/uL — ABNORMAL HIGH (ref 4.0–10.5)
nRBC: 1.4 % — ABNORMAL HIGH (ref 0.0–0.2)
nRBC: 1.6 % — ABNORMAL HIGH (ref 0.0–0.2)

## 2023-07-11 LAB — COOXEMETRY PANEL
Carboxyhemoglobin: 2.5 % — ABNORMAL HIGH (ref 0.5–1.5)
Carboxyhemoglobin: 4.7 % — ABNORMAL HIGH (ref 0.5–1.5)
Methemoglobin: 1.3 % (ref 0.0–1.5)
Methemoglobin: <0.7 % (ref 0.0–1.5)
O2 Saturation: 61.1 %
O2 Saturation: 66.1 %
Total hemoglobin: 8.2 g/dL — ABNORMAL LOW (ref 12.0–16.0)
Total hemoglobin: 8 g/dL — ABNORMAL LOW (ref 12.0–16.0)

## 2023-07-11 LAB — HEPATIC FUNCTION PANEL
ALT: 585 U/L — ABNORMAL HIGH (ref 0–44)
AST: 1848 U/L — ABNORMAL HIGH (ref 15–41)
Albumin: 2.5 g/dL — ABNORMAL LOW (ref 3.5–5.0)
Alkaline Phosphatase: 78 U/L (ref 38–126)
Bilirubin, Direct: 1.8 mg/dL — ABNORMAL HIGH (ref 0.0–0.2)
Indirect Bilirubin: 4.5 mg/dL — ABNORMAL HIGH (ref 0.3–0.9)
Total Bilirubin: 6.3 mg/dL — ABNORMAL HIGH (ref 0.0–1.2)
Total Protein: 5.3 g/dL — ABNORMAL LOW (ref 6.5–8.1)

## 2023-07-11 LAB — GLUCOSE, CAPILLARY
Glucose-Capillary: 113 mg/dL — ABNORMAL HIGH (ref 70–99)
Glucose-Capillary: 128 mg/dL — ABNORMAL HIGH (ref 70–99)
Glucose-Capillary: 131 mg/dL — ABNORMAL HIGH (ref 70–99)
Glucose-Capillary: 136 mg/dL — ABNORMAL HIGH (ref 70–99)
Glucose-Capillary: 156 mg/dL — ABNORMAL HIGH (ref 70–99)
Glucose-Capillary: 81 mg/dL (ref 70–99)
Glucose-Capillary: 90 mg/dL (ref 70–99)
Glucose-Capillary: 99 mg/dL (ref 70–99)

## 2023-07-11 LAB — COMPREHENSIVE METABOLIC PANEL
ALT: 606 U/L — ABNORMAL HIGH (ref 0–44)
AST: 1894 U/L — ABNORMAL HIGH (ref 15–41)
Albumin: 2.5 g/dL — ABNORMAL LOW (ref 3.5–5.0)
Alkaline Phosphatase: 78 U/L (ref 38–126)
Anion gap: 12 (ref 5–15)
BUN: 41 mg/dL — ABNORMAL HIGH (ref 6–20)
CO2: 22 mmol/L (ref 22–32)
Calcium: 7.8 mg/dL — ABNORMAL LOW (ref 8.9–10.3)
Chloride: 107 mmol/L (ref 98–111)
Creatinine, Ser: 3 mg/dL — ABNORMAL HIGH (ref 0.61–1.24)
GFR, Estimated: 26 mL/min — ABNORMAL LOW (ref >60)
Glucose, Bld: 82 mg/dL (ref 70–99)
Potassium: 7 mmol/L (ref 3.5–5.1)
Sodium: 141 mmol/L (ref 135–145)
Total Bilirubin: 7 mg/dL — ABNORMAL HIGH (ref 0.0–1.2)
Total Protein: 5.2 g/dL — ABNORMAL LOW (ref 6.5–8.1)

## 2023-07-11 LAB — BASIC METABOLIC PANEL
Anion gap: 10 (ref 5–15)
Anion gap: 12 (ref 5–15)
BUN: 30 mg/dL — ABNORMAL HIGH (ref 6–20)
BUN: 39 mg/dL — ABNORMAL HIGH (ref 6–20)
CO2: 23 mmol/L (ref 22–32)
CO2: 22 mmol/L (ref 22–32)
Calcium: 8.3 mg/dL — ABNORMAL LOW (ref 8.9–10.3)
Calcium: 7.9 mg/dL — ABNORMAL LOW (ref 8.9–10.3)
Chloride: 107 mmol/L (ref 98–111)
Chloride: 106 mmol/L (ref 98–111)
Creatinine, Ser: 2.09 mg/dL — ABNORMAL HIGH (ref 0.61–1.24)
Creatinine, Ser: 2.76 mg/dL — ABNORMAL HIGH (ref 0.61–1.24)
GFR, Estimated: 41 mL/min — ABNORMAL LOW (ref >60)
GFR, Estimated: 29 mL/min — ABNORMAL LOW (ref >60)
Glucose, Bld: 152 mg/dL — ABNORMAL HIGH (ref 70–99)
Glucose, Bld: 94 mg/dL (ref 70–99)
Potassium: 5.7 mmol/L — ABNORMAL HIGH (ref 3.5–5.1)
Potassium: 7.2 mmol/L (ref 3.5–5.1)
Sodium: 140 mmol/L (ref 135–145)
Sodium: 140 mmol/L (ref 135–145)

## 2023-07-11 LAB — RENAL FUNCTION PANEL
Albumin: 2.4 g/dL — ABNORMAL LOW (ref 3.5–5.0)
Anion gap: 10 (ref 5–15)
BUN: 46 mg/dL — ABNORMAL HIGH (ref 6–20)
CO2: 26 mmol/L (ref 22–32)
Calcium: 8.5 mg/dL — ABNORMAL LOW (ref 8.9–10.3)
Chloride: 104 mmol/L (ref 98–111)
Creatinine, Ser: 3.48 mg/dL — ABNORMAL HIGH (ref 0.61–1.24)
GFR, Estimated: 22 mL/min — ABNORMAL LOW (ref >60)
Glucose, Bld: 132 mg/dL — ABNORMAL HIGH (ref 70–99)
Phosphorus: 5.3 mg/dL — ABNORMAL HIGH (ref 2.5–4.6)
Potassium: 6.2 mmol/L — ABNORMAL HIGH (ref 3.5–5.1)
Sodium: 140 mmol/L (ref 135–145)

## 2023-07-11 LAB — CG4 I-STAT (LACTIC ACID)
Lactic Acid, Venous: 2.9 mmol/L (ref 0.5–1.9)
Lactic Acid, Venous: 3.1 mmol/L (ref 0.5–1.9)
Lactic Acid, Venous: 2.7 mmol/L (ref 0.5–1.9)

## 2023-07-11 LAB — TYPE AND SCREEN
ABO/RH(D): O POS
Antibody Screen: NEGATIVE
Unit division: 0
Unit division: 0

## 2023-07-11 LAB — GLOBAL TEG PANEL
CFF Max Amplitude: 12.3 mm — ABNORMAL LOW (ref 15–32)
CK with Heparinase (R): 9.7 min — ABNORMAL HIGH (ref 4.3–8.3)
Citrated Functional Fibrinogen: 224.5 mg/dL — ABNORMAL LOW (ref 278–581)
Citrated Kaolin (K): 1.9 min (ref 0.8–2.1)
Citrated Kaolin (MA): 54 mm (ref 52–69)
Citrated Kaolin (R): 10 min — ABNORMAL HIGH (ref 4.6–9.1)
Citrated Kaolin Angle: 68.4 deg (ref 63–78)
Citrated Rapid TEG (MA): 53.9 mm (ref 52–70)

## 2023-07-11 LAB — PROTIME-INR
INR: 4.8 (ref 0.8–1.2)
Prothrombin Time: 45.2 s — ABNORMAL HIGH (ref 11.4–15.2)

## 2023-07-11 LAB — MAGNESIUM
Magnesium: 1.9 mg/dL (ref 1.7–2.4)
Magnesium: 1.9 mg/dL (ref 1.7–2.4)
Magnesium: 2.1 mg/dL (ref 1.7–2.4)

## 2023-07-11 LAB — TRIGLYCERIDES
Triglycerides: 60 mg/dL (ref <150)
Triglycerides: 42 mg/dL (ref <150)

## 2023-07-11 LAB — LACTATE DEHYDROGENASE
LDH: 1440 U/L — ABNORMAL HIGH (ref 98–192)
LDH: >2500 U/L — ABNORMAL HIGH (ref 98–192)

## 2023-07-11 LAB — APTT
aPTT: 111 s — ABNORMAL HIGH (ref 24–36)
aPTT: 52 s — ABNORMAL HIGH (ref 24–36)
aPTT: 65 s — ABNORMAL HIGH (ref 24–36)

## 2023-07-11 LAB — TSH: TSH: <0.01 u[IU]/mL — ABNORMAL LOW (ref 0.350–4.500)

## 2023-07-11 LAB — CK
Total CK: 117 U/L (ref 49–397)
Total CK: 357 U/L (ref 49–397)

## 2023-07-11 LAB — BPAM RBC
Blood Product Expiration Date: 202502272359
Blood Product Expiration Date: 202503212359
ISSUE DATE / TIME: 202502271258
ISSUE DATE / TIME: 202502271258
Unit Type and Rh: 9500
Unit Type and Rh: 9500

## 2023-07-11 LAB — FIBRINOGEN: Fibrinogen: 67 mg/dL — CL (ref 210–475)

## 2023-07-11 LAB — PREPARE RBC (CROSSMATCH)

## 2023-07-11 MED ORDER — RENA-VITE PO TABS
1.0000 | ORAL_TABLET | Freq: Every day | ORAL | Status: DC
  Administered 2023-07-11 – 2023-07-15 (×5): 1
  Filled 2023-07-11 (×5): qty 1

## 2023-07-11 MED ORDER — PRISMASOL BGK 2/3.5 32-2-3.5 MEQ/L EC SOLN: Status: DC

## 2023-07-11 MED ORDER — SODIUM CHLORIDE 0.9 % IV SOLN
0.0300 mg/kg/h | INTRAVENOUS | Status: DC
  Administered 2023-07-11: 0.05 mg/kg/h via INTRAVENOUS
  Administered 2023-07-13 – 2023-07-14 (×2): 0.03 mg/kg/h via INTRAVENOUS
  Filled 2023-07-11 (×3): qty 250

## 2023-07-11 MED ORDER — METOCLOPRAMIDE HCL 5 MG/ML IJ SOLN
10.0000 mg | Freq: Three times a day (TID) | INTRAMUSCULAR | Status: DC
  Administered 2023-07-11 – 2023-07-18 (×17): 10 mg via INTRAVENOUS
  Filled 2023-07-11 (×17): qty 2

## 2023-07-11 MED ORDER — VANCOMYCIN HCL IN DEXTROSE 1-5 GM/200ML-% IV SOLN
1000.0000 mg | INTRAVENOUS | Status: DC
  Administered 2023-07-12 – 2023-07-16 (×5): 1000 mg via INTRAVENOUS
  Filled 2023-07-11 (×5): qty 200

## 2023-07-11 MED ORDER — DEXTROSE 50 % IV SOLN: 25.0000 mL | Freq: Once | INTRAVENOUS | Status: AC

## 2023-07-11 MED ORDER — NOREPINEPHRINE 16 MG/250ML-% IV SOLN
0.0000 ug/min | INTRAVENOUS | Status: DC
  Administered 2023-07-11: 2 ug/min via INTRAVENOUS
  Filled 2023-07-11: qty 250

## 2023-07-11 MED ORDER — INSULIN ASPART 100 UNIT/ML IJ SOLN
5.0000 [IU] | Freq: Once | INTRAMUSCULAR | Status: AC
  Administered 2023-07-11: 5 [IU] via INTRAVENOUS

## 2023-07-11 MED ORDER — VANCOMYCIN HCL 2000 MG/400ML IV SOLN
2000.0000 mg | Freq: Once | INTRAVENOUS | Status: AC
  Administered 2023-07-11: 2000 mg via INTRAVENOUS
  Filled 2023-07-11: qty 400

## 2023-07-11 MED ORDER — MIDAZOLAM BOLUS VIA INFUSION
0.0000 mg | INTRAVENOUS | Status: DC | PRN
  Administered 2023-07-11: 2 mg via INTRAVENOUS
  Administered 2023-07-11: 5 mg via INTRAVENOUS
  Administered 2023-07-12: 2 mg via INTRAVENOUS

## 2023-07-11 MED ORDER — NICARDIPINE HCL IN NACL 20-0.86 MG/200ML-% IV SOLN
3.0000 mg/h | INTRAVENOUS | Status: DC
  Filled 2023-07-11: qty 200

## 2023-07-11 MED ORDER — SODIUM BICARBONATE 8.4 % IV SOLN
50.0000 meq | Freq: Once | INTRAVENOUS | Status: DC
  Filled 2023-07-11: qty 50

## 2023-07-11 MED ORDER — DEXMEDETOMIDINE HCL IN NACL 400 MCG/100ML IV SOLN
0.0000 ug/kg/h | INTRAVENOUS | Status: DC
  Administered 2023-07-12: 0.4 ug/kg/h via INTRAVENOUS
  Administered 2023-07-12: 1.2 ug/kg/h via INTRAVENOUS
  Administered 2023-07-12: 0.4 ug/kg/h via INTRAVENOUS
  Administered 2023-07-12: 1.2 ug/kg/h via INTRAVENOUS
  Administered 2023-07-12: 0.4 ug/kg/h via INTRAVENOUS
  Administered 2023-07-13 – 2023-07-18 (×39): 1.2 ug/kg/h via INTRAVENOUS
  Filled 2023-07-11 (×19): qty 100
  Filled 2023-07-11: qty 200
  Filled 2023-07-11 (×2): qty 100
  Filled 2023-07-11: qty 200
  Filled 2023-07-11 (×10): qty 100
  Filled 2023-07-11: qty 200
  Filled 2023-07-11 (×7): qty 100

## 2023-07-11 MED ORDER — HEPARIN SODIUM (PORCINE) 1000 UNIT/ML DIALYSIS
1000.0000 [IU] | INTRAMUSCULAR | Status: DC | PRN
  Administered 2023-07-21 – 2023-07-22 (×2): 3000 [IU] via INTRAVENOUS_CENTRAL
  Administered 2023-07-23: 2400 [IU] via INTRAVENOUS_CENTRAL
  Administered 2023-07-25: 2800 [IU] via INTRAVENOUS_CENTRAL
  Filled 2023-07-11: qty 2
  Filled 2023-07-11: qty 5
  Filled 2023-07-11: qty 6
  Filled 2023-07-11: qty 4
  Filled 2023-07-11 (×2): qty 6
  Filled 2023-07-11: qty 2
  Filled 2023-07-11 (×2): qty 6
  Filled 2023-07-11: qty 5

## 2023-07-11 MED ORDER — SODIUM ZIRCONIUM CYCLOSILICATE 10 G PO PACK
10.0000 g | PACK | Freq: Three times a day (TID) | ORAL | Status: AC
  Administered 2023-07-11 (×3): 10 g
  Filled 2023-07-11 (×2): qty 1

## 2023-07-11 MED ORDER — METHIMAZOLE 5 MG PO TABS
15.0000 mg | ORAL_TABLET | Freq: Three times a day (TID) | ORAL | Status: DC
  Administered 2023-07-11 – 2023-07-14 (×9): 15 mg
  Filled 2023-07-11 (×9): qty 1

## 2023-07-11 MED ORDER — ACD FORMULA A 0.73-2.45-2.2 GM/100ML VI SOLN
3000.0000 mL | Status: DC
  Administered 2023-07-11 – 2023-07-12 (×6): 3000 mL via INTRAVENOUS_CENTRAL
  Administered 2023-07-13: 500 mL via INTRAVENOUS_CENTRAL
  Administered 2023-07-13 – 2023-07-15 (×6): 3000 mL via INTRAVENOUS_CENTRAL
  Filled 2023-07-11: qty 3000
  Filled 2023-07-11: qty 2000
  Filled 2023-07-11: qty 3000
  Filled 2023-07-11: qty 500
  Filled 2023-07-11 (×10): qty 3000

## 2023-07-11 MED ORDER — DEXTROSE 5 % IV SOLN
20.0000 g | INTRAVENOUS | Status: DC
  Administered 2023-07-11 – 2023-07-14 (×6): 20 g via INTRAVENOUS_CENTRAL
  Filled 2023-07-11 (×6): qty 200

## 2023-07-11 MED ORDER — FUROSEMIDE 10 MG/ML IJ SOLN
80.0000 mg | Freq: Once | INTRAMUSCULAR | Status: AC
  Administered 2023-07-11: 80 mg via INTRAVENOUS
  Filled 2023-07-11: qty 8

## 2023-07-11 MED ORDER — DEXTROSE 50 % IV SOLN
INTRAVENOUS | Status: AC
  Administered 2023-07-11: 25 mL via INTRAVENOUS
  Filled 2023-07-11: qty 50

## 2023-07-11 MED ORDER — SODIUM CHLORIDE 0.9% IV SOLUTION: Freq: Once | INTRAVENOUS | Status: DC

## 2023-07-11 MED ORDER — HYDROCORTISONE SOD SUC (PF) 100 MG IJ SOLR
100.0000 mg | Freq: Three times a day (TID) | INTRAMUSCULAR | Status: DC
  Administered 2023-07-11 – 2023-07-14 (×10): 100 mg via INTRAVENOUS
  Filled 2023-07-11 (×10): qty 2

## 2023-07-11 MED ORDER — SODIUM CHLORIDE 0.9 % IV SOLN
1.0000 g | Freq: Three times a day (TID) | INTRAVENOUS | Status: DC
  Administered 2023-07-11 – 2023-07-26 (×43): 1 g via INTRAVENOUS
  Filled 2023-07-11 (×43): qty 20

## 2023-07-11 MED ORDER — METHIMAZOLE 10 MG PO TABS: 10.0000 mg | ORAL_TABLET | Freq: Three times a day (TID) | ORAL | Status: DC

## 2023-07-11 MED ORDER — LACTULOSE 10 GM/15ML PO SOLN
20.0000 g | Freq: Three times a day (TID) | ORAL | Status: DC
  Administered 2023-07-11 – 2023-07-12 (×6): 20 g
  Filled 2023-07-11 (×6): qty 30

## 2023-07-11 MED ORDER — FENTANYL BOLUS VIA INFUSION
50.0000 ug | INTRAVENOUS | Status: DC | PRN
  Administered 2023-07-11 – 2023-07-14 (×12): 100 ug via INTRAVENOUS

## 2023-07-11 MED FILL — Midazolam HCl Inj 5 MG/5ML (Base Equivalent): INTRAMUSCULAR | Qty: 5 | Status: AC

## 2023-07-11 NOTE — Progress Notes (Signed)
 PHARMACY - ANTICOAGULATION CONSULT NOTE  Pharmacy Consult for bivalirudin Indication:  ECMO, Impella  No Known Allergies  Patient Measurements: Height: 6\' 4"  (193 cm) Weight: 95.4 kg (210 lb 5.1 oz) (w/ foot board) IBW/kg (Calculated) : 86.8 Heparin Dosing Weight: n/a  Vital Signs: Temp: 99 F (37.2 C) (02/28 0700) Pulse Rate: 119 (02/28 0645)  Labs: Recent Labs    07/09/23 2047 07/10/23 0644 07/10/23 1224 07/10/23 1310 07/10/23 1425 07/10/23 1428 07/10/23 1708 07/10/23 1709 07/10/23 2015 07/10/23 2034 07/10/23 2238 07/10/23 2302 07/11/23 0026 07/11/23 0404 07/11/23 0407 07/11/23 0512 07/11/23 0540 07/11/23 0737 07/11/23 0742 07/11/23 1034  HGB  --    < > 10.6*   < > 10.1*   < > 10.0*   < > 9.6*   < >  --   --    < > 9.0* 9.9*  --   --   --  8.8*  --   HCT  --    < > 37.0*   < > 33.7*   < > 31.3*   < > 30.1*   < >  --   --    < > 28.0* 29.0*  --   --   --  26.0*  --   PLT  --    < > 269  --  199  --  199  --  210  --   --   --   --  185  --   --   --   --   --   --   APTT  --   --   --   --  134*   < >  --   --  76*  --  50*  --   --   --   --   --  111*  --   --  52*  LABPROT  --   --  31.8*  --   --   --  42.0*  --  36.0*  --   --   --   --  45.2*  --   --   --   --   --   --   INR  --   --  3.1*  --   --   --  4.4*  --  3.6*  --   --   --   --  4.8*  --   --   --   --   --   --   HEPARINUNFRC  --    < > <0.10*  --   --   --  0.23*  --   --   --  <0.10*  --   --   --   --   --   --   --   --   --   CREATININE  --    < > 1.53*   < > 1.88*   < > 1.70*  --   --   --   --  2.09*  --   --   --  2.76*  --  3.00*  --   --   CKTOTAL  --   --   --   --   --   --   --   --   --   --   --   --   --   --   --  117  --   --   --   --   TROPONINIHS  56*  --  263*  --  597*  --   --   --   --   --   --   --   --   --   --   --   --   --   --   --    < > = values in this interval not displayed.    Estimated Creatinine Clearance: 41 mL/min (A) (by C-G formula based on SCr of 3  mg/dL (H)).   Medical History: Past Medical History:  Diagnosis Date   Atrial fibrillation [facility])    Hyperthyroidism    Mitral regurgitation    NSTEMI (non-ST elevated myocardial infarction) [facility])     Assessment: 38 yo M with bilateral PE s/p TNK (2/27 @1156 ) now on [place] ECMO + Impella. Pharmacy consulted for bivalirudin for anticoagulation.   Bivalirudin 0.15mg /kg/hr started post ECMO cannulation + Impella CP P2 with Bicarb purse with aptt 111 > goal  No bleeding noted LDH elevated, fibrinogen low 60 post TNK  Hgb 10.0, Plt 199  Bivalirudin held x2 hr and aptt recheck now 50 - can be affected by shock liver(INR 4, Tbili 7 LFTS 1900/600)  Will resume bivalirudin and aim for higher end of therapeutic goal in setting of PE.   Goal of Therapy:  aPTT 50-70 seconds Monitor platelets by anticoagulation protocol: Yes   Plan:  Bivalirudin 0.03mg /kg/hr  aPTT q6h until therapeutic then transition to q12h F/u CBC, LDH, fibrinogen Monitor s/s bleeding    Leota Sauers Pharm.D. CPP, BCPS Clinical Pharmacist [phone] 07/11/2023 12:18 PM

## 2023-07-11 NOTE — Progress Notes (Signed)
STAT EEG complete. Results pending °

## 2023-07-11 NOTE — Consult Note (Addendum)
 Nephrology Consult   Reason for consult: Refractory hyperkalemia, AKI  Assessment/Recommendations:  AKI -Baseline normal creatinine.  Suspect ATN in the context of cardiac arrest and shock. No obstruction on pan-CT.  -Hyperkalemia refractory to treatment, we will go ahead and start him on CRRT via his [facility] ECMO circuit.  Keep net even to net -50 cc an hour (as tolerated).  All 2K bags -checking urine sediment for completion's sake  -Avoid nephrotoxic medications including NSAIDs and iodinated intravenous contrast exposure unless the latter is absolutely indicated.  Preferred narcotic agents for pain control are hydromorphone, fentanyl, and methadone. Morphine should not be used. Avoid Baclofen and avoid oral sodium phosphate and magnesium citrate based laxatives / bowel preps. Continue strict Input and Output monitoring. Will monitor the patient closely with you and intervene or adjust therapy as indicated by changes in clinical status/labs   Refractory hyperkalemia -CRRT as above, all 2k bags -monitor serial K -agree with supplementing with lokelma, will readjust from daily to TID  VT+PEA cardiac arrest -VA ECMO+Impella start 2/27. Per primary service+ECMO team  PE -On Angiomax  AHRF/VDRF -Vent Per CCM  Cardiogenic shock -pressor support per CCM  Elevated LFTs -Suspect shock liver, monitor  Anemia -Transfuse as needed  Lactic acidosis -secondary shock -bicarb support thru CRRT  Discussed with CCM, AHF, pharmacy, along with rest of team.  Discussed with patient's sister Shanda Bumps over the phone.  Addendum: seen again in afternoon, discussed with fiance at bedside. Filter pressures already starting to climb. Will add citrate protocol thru circuit. Discussed with ahf and ccm.   [name] [facility] 07/11/2023 8:07 AM   _____________________________________________________________________________________   History of Present Illness: Aaron [name] is  a/an 38 y.o. male with a past medical history of hyperthyroidism, A-fib, MR who presents initially with shortness of breath and chest pain.  He was diagnosed with PE in the ER and started on heparin.  He rapidly deteriorated.  Developed agitation, concern for alcohol withdrawal.  Then he further had respiratory failure and VT cardiac arrest on 2/27 with a total CPR time for about 14 minutes.  Had a second arrest-PEA.  Did receive TN K.  Therefore, VA ECMO with Impella initiated thereafter.  He was found to have mild bileaflet mitral valve prolapse with mild MR.  He was found to have severely reduced LV/RV function with an EF less than 15%.  Has been having persistent hyperkalemia despite medical treatment.  ROS unobtainable since patient is intubated and sedated.   Medications:  Current Facility-Administered Medications  Medication Dose Route Frequency Provider Last Rate Last Admin   0.9 %  sodium chloride infusion (Manually program via Guardrails IV Fluids)   Intravenous Once Cloyd Stagers M, PA-C       0.9 %  sodium chloride infusion (Manually program via Guardrails IV Fluids)   Intravenous Once Romie Minus, MD       0.9 %  sodium chloride infusion (Manually program via Guardrails IV Fluids)   Intravenous Once Muqtasid, Samantha T, CRNA       0.9 %  sodium chloride infusion (Manually program via Guardrails IV Fluids)   Intravenous Once Muqtasid, Samantha T, CRNA       0.9 %  sodium chloride infusion   Intravenous PRN Steffanie Dunn, DO 10 mL/hr at 07/11/23 0700 Infusion Verify at 07/11/23 0700   acetaminophen (TYLENOL) tablet 650 mg  650 mg Per Tube Q6H PRN Calton Dach I, [facility]       Or  acetaminophen (TYLENOL) suppository 650 mg  650 mg Rectal Q6H PRN Calton Dach I, RPH       albumin human 5 % solution 12.5 g  12.5 g Intravenous PRN Romie Minus, MD       albumin human 5 % solution            bivalirudin (ANGIOMAX) 250 mg in sodium chloride 0.9 % 500 mL (0.5 mg/mL)  infusion  0.05 mg/kg/hr Intravenous Continuous Arabella Merles, RPH 9.8 mL/hr at 07/11/23 0719 0.05 mg/kg/hr at 07/11/23 0719   cefTRIAXone (ROCEPHIN) 2 g in sodium chloride 0.9 % 100 mL IVPB  2 g Intravenous Q24H Steffanie Dunn, DO   Stopped at 07/10/23 1831   Chlorhexidine Gluconate Cloth 2 % PADS 6 each  6 each Topical Daily Charlsie Quest, MD       docusate (COLACE) 50 MG/5ML liquid 100 mg  100 mg Per Tube BID Karie Fetch P, DO   100 mg at 07/10/23 2255   EPINEPHrine (ADRENALIN) 5 mg in sodium chloride 0.9 % 250 mL (0.02 mg/mL) infusion  0.5-20 mcg/min Intravenous Titrated Oretha Milch, MD       fentaNYL (SUBLIMAZE) bolus via infusion 50-100 mcg  50-100 mcg Intravenous PRN Romie Minus, MD   100 mcg at 07/11/23 0205   fentaNYL (SUBLIMAZE) injection 50 mcg  50 mcg Intravenous Once Cloyd Stagers M, PA-C       fentaNYL in NS (82mcg/ml) infusion-PREMIX  50-200 mcg/hr Intravenous Continuous Steffanie Dunn, DO 15 mL/hr at 07/11/23 0700 150 mcg/hr at 07/11/23 0700   folic acid (FOLVITE) tablet 1 mg  1 mg Per Tube Daily Calton Dach I, RPH       hydrocortisone sodium succinate (SOLU-CORTEF) 100 MG injection 100 mg  100 mg Intravenous Q8H Romie Minus, MD   100 mg at 07/11/23 0615   insulin aspart (novoLOG) injection 0-6 Units  0-6 Units Subcutaneous Q4H Karie Fetch P, DO       lactulose (CHRONULAC) 10 GM/15ML solution 20 g  20 g Per Tube TID Romie Minus, MD       methimazole (TAPAZOLE) tablet 10 mg  10 mg Per Tube Q8H Romie Minus, MD       metoCLOPramide (REGLAN) injection 10 mg  10 mg Intravenous Q8H Romie Minus, MD       midazolam (VERSED) 100 mg/100 mL (1 mg/mL) premix infusion  0-10 mg/hr Intravenous Continuous Steffanie Dunn, DO 6 mL/hr at 07/11/23 0700 6 mg/hr at 07/11/23 0700   midazolam (VERSED) bolus via infusion 0-5 mg  0-5 mg Intravenous PRN Romie Minus, MD   5 mg at 07/11/23 0200   multivitamin with minerals tablet 1 tablet  1  tablet Per Tube Daily Calton Dach I, RPH       nicardipine (CARDENE) 20mg  in 0.86% saline IV infusion (0.1 mg/ml)  3-15 mg/hr Intravenous Continuous Romie Minus, MD       norepinephrine (LEVOPHED) 4mg  in (0.016 mg/mL) premix infusion  0-40 mcg/min Intravenous Titrated Oretha Milch, MD   Stopped at 07/11/23 0159   Oral care mouth rinse  15 mL Mouth Rinse PRN Charlsie Quest, MD       Oral care mouth rinse  15 mL Mouth Rinse Q2H Romie Minus, MD   15 mL at 07/11/23 0600   Oral care mouth rinse  15 mL Mouth Rinse PRN Romie Minus, MD  pantoprazole (PROTONIX) injection 40 mg  40 mg Intravenous QHS Karie Fetch P, DO   40 mg at 07/10/23 2255   polyethylene glycol (MIRALAX / GLYCOLAX) packet 17 g  17 g Per Tube Daily Cloyd Stagers M, PA-C       sodium bicarbonate 25 mEq (Impella PURGE) in dextrose 5 % 1000 mL bag   Intracatheter Continuous Romie Minus, MD   New Bag at 07/10/23 1858   sodium chloride flush (NS) 0.9 % injection 3 mL  3 mL Intravenous Q12H Darreld Mclean R, MD   3 mL at 07/10/23 2200   sodium zirconium cyclosilicate (LOKELMA) packet 10 g  10 g Per Tube Daily Calton Dach I, RPH       thiamine (VITAMIN B1) tablet 100 mg  100 mg Per Tube Daily Calton Dach I, RPH       Or   thiamine (VITAMIN B1) injection 100 mg  100 mg Intravenous Daily Calton Dach I, [facility]         ALLERGIES Patient has no known allergies.  MEDICAL HISTORY Past Medical History:  Diagnosis Date   Atrial fibrillation (HCC)    Hyperthyroidism    Mitral regurgitation    NSTEMI (non-ST elevated myocardial infarction) [facility])      SOCIAL HISTORY Social History   Socioeconomic History   Marital status: Single    Spouse name: Not on file   Number of children: Not on file   Years of education: Not on file   Highest education level: Not on file  Occupational History   Not on file  Tobacco Use   Smoking status: Never   Smokeless tobacco: Never   Vaping Use   Vaping status: Never Used  Substance and Sexual Activity   Alcohol use: No   Drug use: Yes    Frequency: 3.0 times per week    Types: Marijuana   Sexual activity: Never  Other Topics Concern   Not on file  Social History Narrative   Not on file   Social Drivers of Health   Financial Resource Strain: Medium Risk (04/01/2023)   Overall Financial Resource Strain (CARDIA)    Difficulty of Paying Living Expenses: Somewhat hard  Food Insecurity: No Food Insecurity (07/10/2023)   Hunger Vital Sign    Worried About Running Out of Food in the Last Year: Never true    Ran Out of Food in the Last Year: Never true  Transportation Needs: No Transportation Needs (07/10/2023)   PRAPARE - Administrator, Civil Service (Medical): No    Lack of Transportation (Non-Medical): No  Physical Activity: Not on file  Stress: Not on file  Social Connections: Not on file  Intimate Partner Violence: Not At Risk (07/10/2023)   Humiliation, Afraid, Rape, and Kick questionnaire    Fear of Current or Ex-Partner: No    Emotionally Abused: No    Physically Abused: No    Sexually Abused: No     FAMILY HISTORY Family History  Problem Relation Age of Onset   Heart disease Other      Review of Systems: 12 systems reviewed Otherwise as per HPI, all other systems reviewed and negative  Physical Exam: Vitals:   07/11/23 0645 07/11/23 0700  BP:    Pulse: (!) 119   Resp: 15 15  Temp: 99 F (37.2 C) 99 F (37.2 C)  SpO2: 97% 95%   No intake/output data recorded.  Intake/Output Summary (Last 24 hours) at 07/11/2023 1610 Last data filed at  07/11/2023 0700 Gross per 24 hour  Intake 2953.04 ml  Output 2025 ml  Net 928.04 ml   General: ill appearing, sedated/intubated HEENT: anicteric sclera, oropharynx clear without lesions CV: RRR, +murmur Lungs: diminished breath sounds bibasilar, no overt w/r/r/c, intubated Abd: soft, non-tender, non-distended Skin: multiple lines:b/l  internal jugular, b/l fem Ext: trace dependent edema, periphery cool to touch Neuro: sedated GU: +foley  Test Results Reviewed Lab Results  Component Value Date   NA 141 07/11/2023   K 6.5 (HH) 07/11/2023   CL 106 07/11/2023   CO2 22 07/11/2023   BUN 39 (H) 07/11/2023   CREATININE 2.76 (H) 07/11/2023   CALCIUM 7.9 (L) 07/11/2023   ALBUMIN 2.5 (L) 07/11/2023   PHOS 8.3 (H) 07/10/2023     I have reviewed all relevant outside healthcare records related to the patient's kidney injury.     The patient is critically ill with AKI, refractory hyperkalemia, cardiogenic shock, cardiac arrest, PE, requiring VA ECMO & Impella,  and which includes my role to primarily manage AKI, hyperkalemia, volume status.  This requires high complexity decision making.  Total critical care time:    Critical care time was exclusive of treating other patients.   Critical care was necessary to treat or prevent imminent or life-threatening deterioration.   Critical care was time spent personally by me on the following activities:   development of treatment plan with patient and/or surrogate as well as nursing,   discussions with other provider evaluation of patient's response to treatment  examination of patient  obtaining history from patient or surrogate  ordering and performing treatments and interventions  ordering and review of laboratory studies  ordering and review of radiographic studies

## 2023-07-11 NOTE — Consult Note (Signed)
 NAME:  Aaron [name], MRN:  [id number], DOB:  [birth year], LOS: 2 ADMISSION DATE:  07/09/2023, CONSULTATION DATE:  07/11/2023 REFERRING MD: Clearnce Hasten, CHIEF COMPLAINT: Status post cardiac arrest  History of Present Illness:  38 year old male with hypothyroidism, paroxysmal A-fib, severe mitral regurgitation who presented to [facility] hospital with chest pain and shortness of breath for few days associated cough, nausea and diarrhea.  In the emergency department he was diagnosed with PE, was started on IV heparin.  He takes beta-blocker, steroid and methimazole for hyperthyroidism.  On 2/27 patient rapidly deteriorated developed V. tach/V-fib cardiac arrest patient was intubated after 40 minutes of CPR, cardiology was consulted patient was placed on VA ECMO and was transferred to [facility]  Pertinent  Medical History   Past Medical History:  Diagnosis Date   Atrial fibrillation [facility])    Hyperthyroidism    Mitral regurgitation    NSTEMI (non-ST elevated myocardial infarction) (HCC)      Significant Hospital Events: Including procedures, antibiotic start and stop dates in addition to other pertinent events     Interim History / Subjective:  Patient is on [place] ECMO Not making much urine, serum creatinine started trending up as well as he became hyperkalemic Remain afebrile Overnight had runs of A-fib and bigeminy  Objective   Blood pressure (!) 86/60, pulse (!) 119, temperature 99 F (37.2 C), resp. rate 15, height 6\' 4"  (1.93 m), weight 95.4 kg, SpO2 95%. PAP: (30-55)/(7-35) 43/30 CVP:  [7 mmHg-15 mmHg] 9 mmHg CO:  [2.6 L/min-3.8 L/min] 2.7 L/min CI:  [1.2 L/min/m2-1.7 L/min/m2] 1.2 L/min/m2  Vent Mode: PRVC FiO2 (%):  [50 %-100 %] 50 % Set Rate:  [12 bmp-30 bmp] 12 bmp Vt Set:  [610 mL-690 mL] 690 mL PEEP:  [5 cmH20] 5 cmH20 Plateau Pressure:  [23 cmH20-25 cmH20] 25 cmH20   Intake/Output Summary (Last 24 hours) at 07/11/2023 0843 Last data filed at 07/11/2023  0700 Gross per 24 hour  Intake 2942.84 ml  Output 2025 ml  Net 917.84 ml   Filed Weights   07/09/23 1838 07/11/23 0600  Weight: 97.5 kg 95.4 kg    Examination: General: Crtitically ill-appearing male, orally intubated HEENT: [place]/AT, eyes anicteric.  ETT and OGT in place Neuro: Sedated, not following commands.  Eyes are closed.  Pupils 3 mm bilateral reactive to light Chest: Bilateral rhonchorous breath sounds, left more than right, no wheezes or rhonchi Heart: Regular rate and rhythm, pansystolic murmur heard Abdomen: Soft, nondistended, bowel sounds present Skin: No rash  Labs and images reviewed   Resolved Hospital Problem list     Assessment & Plan:  Status post in-hospital V. tach followed by PEA cardiac arrest Paroxysmal A-fib RVR Severe mitral regurgitation Acute biventricular HFrEF with cardiogenic shock status post VA ECMO Acute pulmonary emboli status post TNK Lactic acidosis, resolved Acute respiratory failure with hypoxia Hypothyroidism Acute kidney injury due to ischemic ATN Refractory hyperkalemia Hyperphosphatemia Shock liver Acute on chronic anemia due to critical illness  Currently on VA ECMO with 4.6 L flow On Impella at P2 with 2 L flow Continue bicarb purge Continue telemetry monitoring Closely monitor and supplement electrolytes Cardiology is following, will need mitral valve surgery once clinically stable Coox is 61% Not making much urine, will be started on CRRT Nephrology consulted His potassium remains at 7 Monitor PTT, continue bivalved Trend lactate Continue lung protective ventilation VAP prevention bundle in place PAD protocol with Dilaudid and Versed EEG was negative for acute seizures, showing diffuse  encephalopathy Trend LFTs Continue methimazole and stress dose steroid Holding beta-blocker considering cardiogenic shock Monitor H&H and transfuse if less than 7 Continue IV ceftriaxone  Best Practice (right click and "Reselect  all SmartList Selections" daily)   Diet/type: NPO DVT prophylaxis systemic bival Pressure ulcer(s): N/A GI prophylaxis: PPI Lines: Central line, Arterial Line, and yes and it is still needed.  ECMO cannula in place Foley:  Yes, and it is still needed Code Status:  full code Last date of multidisciplinary goals of care discussion [Per primary team]  Labs   CBC: Recent Labs  Lab 07/10/23 1224 07/10/23 1310 07/10/23 1425 07/10/23 1428 07/10/23 1708 07/10/23 1709 07/10/23 2015 07/10/23 2034 07/10/23 2211 07/11/23 0404 07/11/23 0407 07/11/23 0742  WBC 14.3*  --  23.2*  --  28.0*  --  32.8*  --   --  29.7*  --   --   HGB 10.6*   < > 10.1*   < > 10.0*   < > 9.6* 10.5* 9.9* 9.0* 9.9* 8.8*  HCT 37.0*   < > 33.7*   < > 31.3*   < > 30.1* 31.0* 29.0* 28.0* 29.0* 26.0*  MCV 72.8*  --  70.9*  --  69.6*  --  69.7*  --   --  70.4*  --   --   PLT 269  --  199  --  199  --  210  --   --  185  --   --    < > = values in this interval not displayed.    Basic Metabolic Panel: Recent Labs  Lab 07/10/23 1224 07/10/23 1310 07/10/23 1425 07/10/23 1428 07/10/23 1501 07/10/23 1521 07/10/23 1708 07/10/23 1709 07/10/23 2211 07/10/23 2302 07/11/23 0407 07/11/23 0512 07/11/23 0742  NA 146*   < > 136   < > 140   < > 140   < > 140 140 139 140 141  K 7.2*   < > 6.2*   < > 5.3*   < > 4.4   < > 6.5* 5.7* 6.9* 7.2* 6.5*  CL 108   < > 101  --  105  --  104  --   --  107  --  106  --   CO2 22  --  17*  --   --   --  18*  --   --  23  --  22  --   GLUCOSE 49*   < > 265*  --  119*  --  56*  --   --  152*  --  94  --   BUN 21*   < > 20  --  21*  --  21*  --   --  30*  --  39*  --   CREATININE 1.53*   < > 1.88*  --  1.80*  --  1.70*  --   --  2.09*  --  2.76*  --   CALCIUM 14.8*  --  11.4*  --   --   --  9.9  --   --  8.3*  --  7.9*  --   MG 3.3*  --   --   --   --   --   --   --   --  1.9  --  1.9  --   PHOS 8.3*  --   --   --   --   --   --   --   --   --   --   --   --    < > =  values in this  interval not displayed.   GFR: Estimated Creatinine Clearance: 44.6 mL/min (A) (by C-G formula based on SCr of 2.76 mg/dL (H)). Recent Labs  Lab 07/10/23 1425 07/10/23 1446 07/10/23 1708 07/10/23 1717 07/10/23 1925 07/10/23 2015 07/10/23 2217 07/11/23 0404  WBC 23.2*  --  28.0*  --   --  32.8*  --  29.7*  LATICACIDVEN  --    < >  --  7.5* 4.9*  --  3.7* 2.9*   < > = values in this interval not displayed.    Liver Function Tests: Recent Labs  Lab 07/09/23 1934 07/10/23 1224 07/10/23 1425 07/10/23 1708 07/11/23 0512  AST 19 146* 212* 575* 1,848*  ALT 15 95* 121* 285* 585*  ALKPHOS 84 86 70 73 78  BILITOT 1.7* 2.7* 2.6* 3.0* 6.3*  PROT 7.0 6.2* 5.2* 5.2* 5.3*  ALBUMIN 3.6 3.1* 2.5* 2.6* 2.5*   Recent Labs  Lab 07/09/23 1934  LIPASE 23   No results for input(s): "AMMONIA" in the last 168 hours.  ABG    Component Value Date/Time   PHART 7.394 07/11/2023 0742   PCO2ART 40.8 07/11/2023 0742   PO2ART 90 07/11/2023 0742   HCO3 24.9 07/11/2023 0742   TCO2 26 07/11/2023 0742   ACIDBASEDEF 1.0 07/10/2023 1802   O2SAT 97 07/11/2023 0742     Coagulation Profile: Recent Labs  Lab 07/09/23 1851 07/10/23 1224 07/10/23 1708 07/10/23 2015 07/11/23 0404  INR 1.4* 3.1* 4.4* 3.6* 4.8*    Cardiac Enzymes: Recent Labs  Lab 07/11/23 0512  CKTOTAL 117    HbA1C: Hgb A1c MFr Bld  Date/Time Value Ref Range Status  07/10/2023 06:44 AM 4.8 4.8 - 5.6 % Final    Comment:    (NOTE) Pre diabetes:          5.7%-6.4%  Diabetes:              >6.4%  Glycemic control for   <7.0% adults with diabetes   10/20/2021 01:07 AM 4.2 (L) 4.8 - 5.6 % Final    Comment:    (NOTE) Pre diabetes:          5.7%-6.4%  Diabetes:              >6.4%  Glycemic control for   <7.0% adults with diabetes     CBG: Recent Labs  Lab 07/10/23 2307 07/11/23 0024 07/11/23 0401 07/11/23 0544 07/11/23 0643  GLUCAP 166* 131* 113* 128* 81    Review of Systems:   Unable to obtain as  patient is intubated and sedated  Past Medical History:  He,  has a past medical history of Atrial fibrillation (HCC), Hyperthyroidism, Mitral regurgitation, and NSTEMI (non-ST elevated myocardial infarction) (HCC).   Surgical History:   Past Surgical History:  Procedure Laterality Date   ECMO CANNULATION N/A 07/10/2023   Procedure: ECMO CANNULATION;  Surgeon: Dolores Patty, MD;  Location: [facility] INVASIVE CV LAB;  Service: Cardiovascular;  Laterality: N/A;   LEFT HEART CATH AND CORONARY ANGIOGRAPHY N/A 10/22/2021   Procedure: LEFT HEART CATH AND CORONARY ANGIOGRAPHY;  Surgeon: Yvonne Kendall, MD;  Location: [facility] INVASIVE CV LAB;  Service: Cardiovascular;  Laterality: N/A;   VENTRICULAR ASSIST DEVICE INSERTION N/A 07/10/2023   Procedure: VENTRICULAR ASSIST DEVICE INSERTION;  Surgeon: Dolores Patty, MD;  Location: [facility] INVASIVE CV LAB;  Service: Cardiovascular;  Laterality: N/A;     Social History:   reports that he has never smoked. He has never used smokeless tobacco. He  reports current drug use. Frequency: 3.00 times per week. Drug: Marijuana. He reports that he does not drink alcohol.   Family History:  His family history includes Heart disease in an other family member.   Allergies No Known Allergies   Home Medications  Prior to Admission medications   Medication Sig Start Date End Date Taking? Authorizing Provider  bismuth subsalicylate (PEPTO BISMOL) 262 MG chewable tablet Chew 524 mg by mouth as needed for indigestion.   Yes [provider]  apixaban (ELIQUIS) 5 MG TABS tablet Take 1 tablet (5 mg total) by mouth 2 (two) times daily. Patient not taking: Reported on 07/09/2023 03/27/23   Christell Constant, MD  methimazole (TAPAZOLE) 10 MG tablet Take 1 tablet (10 mg total) by mouth every 8 (eight) hours. 03/22/23 06/20/23  Rhetta Mura, MD  metoprolol tartrate 37.5 MG TABS Take 1 tablet (37.5 mg total) by mouth 2 (two) times daily. 03/22/23 06/20/23  Rhetta Mura, MD  spironolactone (ALDACTONE) 25 MG tablet Take 1 tablet (25 mg total) by mouth daily. Patient not taking: Reported on 07/09/2023 03/27/23   Christell Constant, MD     Critical care time:     The patient is critically ill due to cardiogenic shock status post VA ECMO.  Critical care was necessary to treat or prevent imminent or life-threatening deterioration.  Critical care was time spent personally by me on the following activities: development of treatment plan with patient and/or surrogate as well as nursing, discussions with consultants, evaluation of patient's response to treatment, examination of patient, obtaining history from patient or surrogate, ordering and performing treatments and interventions, ordering and review of laboratory studies, ordering and review of radiographic studies, pulse oximetry, re-evaluation of patient's condition and participation in multidisciplinary rounds.   During this encounter critical care time was devoted to patient care services described in this note for 47 minutes.     Cheri Fowler, MD [facility] See Amion for pager If no response to pager, please call [phone] until 7pm After 7pm, Please call E-link [phone]

## 2023-07-11 NOTE — Progress Notes (Signed)
 Lower extremity arterial duplex completed. Please see CV Procedures for preliminary results.  Shona Simpson, RVT 07/11/23 10:10 AM

## 2023-07-11 NOTE — Progress Notes (Signed)
 Lower extremity venous duplex completed. Please see CV Procedures for preliminary results.  Shona Simpson, RVT 07/11/23 10:10 AM

## 2023-07-11 NOTE — Progress Notes (Signed)
 PHARMACY - ANTICOAGULATION CONSULT NOTE  Pharmacy Consult for bivalirudin Indication:  ECMO, Impella  No Known Allergies  Patient Measurements: Height: 6\' 4"  (193 cm) Weight: 95.4 kg (210 lb 5.1 oz) (w/ foot board) IBW/kg (Calculated) : 86.8 Heparin Dosing Weight: n/a  Vital Signs: Temp: 99 F (37.2 C) (02/28 1800) Temp Source: Core (02/28 1600) Pulse Rate: 124 (02/28 1800)  Labs: Recent Labs    07/09/23 2047 07/10/23 0644 07/10/23 1224 07/10/23 1310 07/10/23 1425 07/10/23 1428 07/10/23 1708 07/10/23 1709 07/10/23 2015 07/10/23 2034 07/10/23 2238 07/10/23 2302 07/11/23 0404 07/11/23 0407 07/11/23 0512 07/11/23 0540 07/11/23 0737 07/11/23 0742 07/11/23 1034 07/11/23 1314 07/11/23 1635 07/11/23 1654 07/11/23 1658 07/11/23 1700  HGB  --    < > 10.6*   < > 10.1*   < > 10.0*   < > 9.6*   < >  --    < > 9.0*   < >  --   --   --    < >  --    < >  --  9.2* 8.8* 7.9*  HCT  --    < > 37.0*   < > 33.7*   < > 31.3*   < > 30.1*   < >  --    < > 28.0*   < >  --   --   --    < >  --    < >  --  27.0* 26.0* 25.9*  PLT  --    < > 269  --  199  --  199  --  210  --   --   --  185  --   --   --   --   --   --   --   --   --   --  152  APTT  --   --   --   --  134*   < >  --   --  76*  --  50*  --   --   --   --  111*  --   --  52*  --  65*  --   --   --   LABPROT  --   --  31.8*  --   --   --  42.0*  --  36.0*  --   --   --  45.2*  --   --   --   --   --   --   --   --   --   --   --   INR  --   --  3.1*  --   --   --  4.4*  --  3.6*  --   --   --  4.8*  --   --   --   --   --   --   --   --   --   --   --   HEPARINUNFRC  --    < > <0.10*  --   --   --  0.23*  --   --   --  <0.10*  --   --   --   --   --   --   --   --   --   --   --   --   --   CREATININE  --    < > 1.53*   < > 1.88*   < > 1.70*  --   --   --   --    < >  --   --  2.76*  --  3.00*  --   --   --   --  3.40*  --   --   CKTOTAL  --   --   --   --   --   --   --   --   --   --   --   --   --   --  117  --   --   --    --   --   --   --   --   --   TROPONINIHS 56*  --  263*  --  597*  --   --   --   --   --   --   --   --   --   --   --   --   --   --   --   --   --   --   --    < > = values in this interval not displayed.    Estimated Creatinine Clearance: 36.2 mL/min (A) (by C-G formula based on SCr of 3.4 mg/dL (H)).   Medical History: Past Medical History:  Diagnosis Date   Atrial fibrillation [facility])    Hyperthyroidism    Mitral regurgitation    NSTEMI (non-ST elevated myocardial infarction) [facility])     Assessment: 38 yo M with bilateral PE s/p TNK (2/27 @1156 ) now on [place] ECMO + Impella. Pharmacy consulted for bivalirudin for anticoagulation.   aPTT 65 - therapeutic  Hgb 7.9, Plt 152  Goal of Therapy:  aPTT 50-70 seconds Monitor platelets by anticoagulation protocol: Yes   Plan:  Continue bivalirudin 0.03mg /kg/hr  aPTT q6h until therapeutic then transition to q12h F/u CBC, LDH, fibrinogen Monitor s/s bleeding    Calton Dach, PharmD, BCCCP Clinical Pharmacist 07/11/2023 6:13 PM

## 2023-07-11 NOTE — Progress Notes (Signed)
 Advanced Heart Failure Rounding Note  Cardiologist: Christell Constant, MD  Chief Complaint:  Subjective:    Admitted 2/26 with worsening shortness of breath, chest pain, atrial fibrillation with RVR. Decompensated 2/27 with IVF, nodal blockade with subsequent respiratory arrest, ROSC achieved after ~16 minute down time Femoral VA ecmo cannulation 2/27 with Impella CP vent  Lactic acid trending down rapidly with improved flow. Goal total flow 6L between VA ecmo and impella.   Worsening AKI overnight and hematuria, minimal output to IV lasix this AM. Given K of 7 after shifting will start CRRT run through ECMO circuit.  Dopplers ordered to assess clot burden.  Expanding ID coverage to vanc/ceftriaxone.  Wean sedation today.  Blender at 80%, impella reduced to P2. Will hold on 5.5 upgrade given coagulopathy.   CVP 6, PA 43/24, PCWP 18.   Objective:   Weight Range: 95.4 kg Body mass index is 25.6 kg/m.   Vital Signs:   Temp:  [96.8 F (36 C)-99.3 F (37.4 C)] 98.8 F (37.1 C) (02/28 1300) Pulse Rate:  [26-154] 53 (02/28 1300) Resp:  [11-16] 12 (02/28 1300) BP: (86-166)/(52-116) 86/60 (02/28 0000) SpO2:  [92 %-100 %] 100 % (02/28 1300) Arterial Line BP: (79-153)/(61-142) 94/65 (02/28 1300) FiO2 (%):  [50 %-80 %] 50 % (02/28 1336) Weight:  [95.4 kg] 95.4 kg (02/28 0600) Last BM Date : 07/10/23  Weight change: Filed Weights   07/09/23 1838 07/11/23 0600  Weight: 97.5 kg 95.4 kg    Intake/Output:   Intake/Output Summary (Last 24 hours) at 07/11/2023 1416 Last data filed at 07/11/2023 1300 Gross per 24 hour  Intake 3572.83 ml  Output 2575 ml  Net 997.83 ml      Physical Exam    General: Sedated, acutely ill-appearing Lungs: Mild bilateral rhonchi t. CV: Regular, systolic murmur best heard at the apex trace peripheral edema. JVP mildly elevated, right IJ Swan, left IJ triple-lumen, left femoral arterial return cannula with distal perfusion catheter,  right femoral venous drainage cannula, right femoral arterial Impella CP Abdomen: Soft, no distention.  Neurologic: Sedated   Telemetry   Ventricular bigeminy, occasional sinus tachycardia   Labs    CBC Recent Labs    07/10/23 2015 07/10/23 2034 07/11/23 0404 07/11/23 0407 07/11/23 0742  WBC 32.8*  --  29.7*  --   --   HGB 9.6*   < > 9.0* 9.9* 8.8*  HCT 30.1*   < > 28.0* 29.0* 26.0*  MCV 69.7*  --  70.4*  --   --   PLT 210  --  185  --   --    < > = values in this interval not displayed.   Basic Metabolic Panel Recent Labs    21/30/86 1224 07/10/23 1310 07/11/23 0512 07/11/23 0737 07/11/23 0742  NA 146*   < > 140 141 141  K 7.2*   < > 7.2* 7.0* 6.5*  CL 108   < > 106 107  --   CO2 22   < > 22 22  --   GLUCOSE 49*   < > 94 82  --   BUN 21*   < > 39* 41*  --   CREATININE 1.53*   < > 2.76* 3.00*  --   CALCIUM 14.8*   < > 7.9* 7.8*  --   MG 3.3*   < > 1.9 2.1  --   PHOS 8.3*  --   --   --   --    < > =  values in this interval not displayed.   Liver Function Tests Recent Labs    07/11/23 0512 07/11/23 0737  AST 1,848* 1,894*  ALT 585* 606*  ALKPHOS 78 78  BILITOT 6.3* 7.0*  PROT 5.3* 5.2*  ALBUMIN 2.5* 2.5*   Recent Labs    07/09/23 1934  LIPASE 23   Cardiac Enzymes Recent Labs    07/11/23 0512  CKTOTAL 117    BNP: BNP (last 3 results) Recent Labs    07/09/23 1934 07/10/23 1224  BNP 703.7* 980.5*    ProBNP (last 3 results) No results for input(s): "PROBNP" in the last 8760 hours.   D-Dimer Recent Labs    07/09/23 1934  DDIMER 1.83*   Hemoglobin A1C Recent Labs    07/10/23 0644  HGBA1C 4.8   Fasting Lipid Panel Recent Labs    07/11/23 0512  TRIG 42   Thyroid Function Tests Recent Labs    07/11/23 0512  TSH <0.010*     Medications:     Scheduled Medications:  sodium chloride   Intravenous Once   sodium chloride   Intravenous Once   sodium chloride   Intravenous Once   sodium chloride   Intravenous Once    Chlorhexidine Gluconate Cloth  6 each Topical Daily   docusate  100 mg Per Tube BID   fentaNYL (SUBLIMAZE) injection  50 mcg Intravenous Once   folic acid  1 mg Per Tube Daily   hydrocortisone sod succinate (SOLU-CORTEF) inj  100 mg Intravenous Q8H   insulin aspart  0-6 Units Subcutaneous Q4H   lactulose  20 g Per Tube TID   methimazole  15 mg Per Tube Q8H   metoCLOPramide (REGLAN) injection  10 mg Intravenous Q8H   multivitamin with minerals  1 tablet Per Tube Daily   mouth rinse  15 mL Mouth Rinse Q2H   pantoprazole (PROTONIX) IV  40 mg Intravenous QHS   polyethylene glycol  17 g Per Tube Daily   sodium chloride flush  3 mL Intravenous Q12H   sodium zirconium cyclosilicate  10 g Per Tube TID   thiamine  100 mg Per Tube Daily   Or   thiamine  100 mg Intravenous Daily    Infusions:  sodium chloride 10 mL/hr at 07/11/23 1300   albumin human     bivalirudin (ANGIOMAX) 250 mg in sodium chloride 0.9 % 500 mL (0.5 mg/mL) infusion 0.03 mg/kg/hr (07/11/23 1300)   calcium gluconate 20 g in dextrose 5 % 1,000 mL infusion     cefTRIAXone (ROCEPHIN)  IV Stopped (07/10/23 1831)   citrate dextrose     dexmedetomidine (PRECEDEX) IV infusion Stopped (07/11/23 1036)   fentaNYL infusion INTRAVENOUS 150 mcg/hr (07/11/23 1300)   midazolam Stopped (07/11/23 0828)   niCARDipine Stopped (07/11/23 0843)   norepinephrine (LEVOPHED) Adult infusion 1 mcg/min (07/11/23 1300)   PrismaSol BGK 2/3.5 50 mL/hr at 07/11/23 1039   PrismaSol BGK 2/3.5 400 mL/hr at 07/11/23 1039   PrismaSol BGK 2/3.5 1,500 mL/hr at 07/11/23 1038   sodium bicarbonate 25 mEq (Impella PURGE) in dextrose 5 % 1000 mL bag      PRN Medications: sodium chloride, acetaminophen **OR** acetaminophen, albumin human, fentaNYL, heparin, midazolam, mouth rinse, mouth rinse    Patient Profile   Patient with a history of Grave's disease, severe primary mitral regurgitation who presented with chest pain and segmental PE. Subsequent  progressed to SCAI Stage E shock requiring VA ECMO cannulation on 2/27.   Assessment/Plan   SCAI Stage E  Cardiogenic shock - Suspect hypoxic respiratory failure driven with segmental PE on top of severe MR, nodal blockage, and thyrotoxicosis - Down time ~ 20 minutes - Impella CP placed for LV vent given severe MR - Lactic acid improving - Swan indicates adequate unloading - Will hopefully be able to transition to impella 5.5 once stabilized - Impella CP at P2 - Vanc/CTX for antibiotic prophylaxis  Severe mitral valve regurgitation - Noted on previous echocardiogram - Suspect contributing to ongoing shock - EcPella for management currently - TCTS consult for consideration of mitral valve repair - Will need neurological recovery, clinical improvement prior to consideration - Bailout option of mitraclip if unable to wean from support - Swan in place  Acute renal failure/hyperkalemia - 80mg  IV lasix given this morning, modest output but given worsening K CRRT started - Appreciate nephrology consult - CRRT set up through ECMO circuit  Thyrotoxicosis - Was not taking medications as they made him feel poorly - Suspect underlying low output heart failure due to mitral valve disease - Start IV hydrocortisone 100mg  q8 - Increase methimazole to 15mg  q8 - Repeat thyroid labs next week - Will need definitive outpatient treatment  Atrial fibrillation - Present on arrival, in the setting of PE and thyroid disease - Currently in NSR - Bivalrudin for anticoagulation  Pulmonary embolism:  - Segmental without right heart strain - Bivalrudin as above  CAD - Prior embolic infarct in the LAD territory with corresponding LGE on CMR - Lifelong anticoagulation  Sedation - Fentanyl, versed as needed - Versed on hold now - Spot EEG without evidence of seizure - Notably minimal alcohol use after discussion with fiance. Was not withdrawing yesterday during event  Medication concerns  reviewed with patient and pharmacy team. Barriers identified: antibiotic dosing, CRRT  Length of Stay: 2  Romie Minus, MD  07/11/2023, 2:16 PM  Advanced Heart Failure Team Pager [phone] (M-F; 7a - 5p)  Please contact [facility] for night-coverage after hours (5p -7a ) and weekends on [email or web address]  CRITICAL CARE Performed by: Romie Minus   Total critical care time: 125 minutes  Critical care time was exclusive of separately billable procedures and treating other patients.  Critical care was necessary to treat or prevent imminent or life-threatening deterioration.  Critical care was time spent personally by me on the following activities: development of treatment plan with patient and/or surrogate as well as nursing, discussions with consultants, evaluation of patient's response to treatment, examination of patient, obtaining history from patient or surrogate, ordering and performing treatments and interventions, ordering and review of laboratory studies, ordering and review of radiographic studies, pulse oximetry and re-evaluation of patient's condition.

## 2023-07-11 NOTE — Progress Notes (Signed)
 Initial Nutrition Assessment  DOCUMENTATION CODES:   Not applicable  INTERVENTION:   Initiate tube feeding via OGT when medically feasible: Pivot 1.5  at 10 ml/h and increase by 10 ml/hr every 8 hours until goal of 70 ml/hr (1680 ml per day) Prosource TF20 60 ml BID  Provides 2720 kcal, 197 gm protein, 1260 ml free water daily  Rena-vite daily  100 mg Thiamine daily   NUTRITION DIAGNOSIS:   Increased nutrient needs related to acute illness as evidenced by estimated needs.   GOAL:   Patient will meet greater than or equal to 90% of their needs   MONITOR:   Diet advancement, Vent status, Labs, I & O's  REASON FOR ASSESSMENT:   Consult Assessment of nutrition requirement/status  ASSESSMENT:  38 y.o male presented with left-sided chest pain and shortness of breath, diagnosed with PE, coded and was intubated after 40 minutes of CPR. Subsequently ECMO started. PMH of PAF, chronic HFpEF, severe mitral regurgitation, hyperthyroidism. Noncompliance with medicines.  Fiance at bedside. She reports he has always had a good appetite, she knew something was wrong when she started to see his appetite decrease.Last week he was not feeling good and had vomiting and diarrhea but fiance reports his intake really started to decrease the beginning of this week. She states he has only had a sandwich this week. She has not noticed any unintentional weight loss from patient.   VA ECMO continuing, now to start on CRRT for refractory hyperkalemia and AKI. Not making much urine. Pressors off, weaning sedation.   Patient was on the cortrak list for today but INR was low. MD to hold off on nutrition for now, possibly start trickles tomorrow. Has OGT access.   Patient is currently intubated on ventilator support MV: 8.3 L/min Temp (24hrs), Avg:98.7 F (37.1 C), Min:96.8 F (36 C), Max:99.3 F (37.4 C)  Admit weight: 97.5 kg   Current weight: 95.4 kg    Intake/Output Summary (Last 24 hours)  at 07/11/2023 1453 Last data filed at 07/11/2023 1400 Gross per 24 hour  Intake 3330.17 ml  Output 2739.9 ml  Net 590.27 ml   Net IO Since Admission: 2,083.95 mL [07/11/23 1453]  Drains/Lines: OGT output: 100 ml x 24 hours  UOP: 400 ml x 24 hours   Nutritionally Relevant Medications: Scheduled Meds:  docusate  100 mg Per Tube BID   fentaNYL (SUBLIMAZE) injection  50 mcg Intravenous Once   folic acid  1 mg Per Tube Daily   insulin aspart  0-6 Units Subcutaneous Q4H   lactulose  20 g Per Tube TID   metoCLOPramide (REGLAN) injection  10 mg Intravenous Q8H   multivitamin with minerals  1 tablet Per Tube Daily   polyethylene glycol  17 g Per Tube Daily   thiamine  100 mg Per Tube Daily   Or   thiamine  100 mg Intravenous Daily   Continuous Infusions:  sodium chloride 10 mL/hr at 07/11/23 1400   albumin human     bivalirudin (ANGIOMAX) 250 mg in sodium chloride 0.9 % 500 mL (0.5 mg/mL) infusion 0.03 mg/kg/hr (07/11/23 1400)   calcium gluconate 20 g in dextrose 5 % 1,000 mL infusion     cefTRIAXone (ROCEPHIN)  IV Stopped (07/10/23 1831)   citrate dextrose     dexmedetomidine (PRECEDEX) IV infusion Stopped (07/11/23 1036)   fentaNYL infusion INTRAVENOUS 150 mcg/hr (07/11/23 1400)   midazolam Stopped (07/11/23 0828)   niCARDipine Stopped (07/11/23 0843)   norepinephrine (LEVOPHED) Adult infusion Stopped (  07/11/23 1305)   PrismaSol BGK 2/3.5 50 mL/hr at 07/11/23 1039   PrismaSol BGK 2/3.5 400 mL/hr at 07/11/23 1039   PrismaSol BGK 2/3.5 1,500 mL/hr at 07/11/23 1038   sodium bicarbonate 25 mEq (Impella PURGE) in dextrose 5 % 1000 mL bag     Labs Reviewed: Potassium 6.5, BUN 41, Creatinine 3, Calcium 7.8,  CBG ranges from 62-166 mg/dL over the last 24 hours HgbA1c 4.8  NUTRITION - FOCUSED PHYSICAL EXAM:  Flowsheet Row Most Recent Value  Orbital Region No depletion  Upper Arm Region No depletion  Thoracic and Lumbar Region No depletion  Buccal Region No depletion  Temple  Region No depletion  Clavicle Bone Region No depletion  Clavicle and Acromion Bone Region No depletion  Scapular Bone Region Unable to assess  Dorsal Hand No depletion  Patellar Region No depletion  Anterior Thigh Region No depletion  Posterior Calf Region No depletion  Edema (RD Assessment) None  Hair Reviewed  Eyes Unable to assess  Mouth Unable to assess  Skin Reviewed  Nails Reviewed       Diet Order:   Diet Order             Diet NPO time specified  Diet effective now                   EDUCATION NEEDS:   Not appropriate for education at this time  Skin:  Skin Assessment: Reviewed RN Assessment  Last BM:  07/10/23  Height:   Ht Readings from Last 1 Encounters:  07/09/23 6\' 4"  (1.93 m)    Weight:   Wt Readings from Last 1 Encounters:  07/11/23 95.4 kg    Ideal Body Weight:  91.8 kg  BMI:  Body mass index is 25.6 kg/m.  Estimated Nutritional Needs:   Kcal:  2600-2800 kcal  Protein:  190-210 gm  Fluid:  >2L/day   Elliot Dally, RD Registered Dietitian  See Amion for more information

## 2023-07-11 NOTE — Progress Notes (Signed)
 Pharmacy Antibiotic Note  Aaron [name] is a 38 y.o. male admitted on 07/09/2023 with surgical prophylaxis s/p ECMO cannulation 2/28. Starting CRRT, Tm 99.9 WBC 30 - in setting of steroids   Pharmacy has been consulted for vancomcyin dosing.  Plan: Vancomycin 2gm IV x1 then 1gm IV q24h  Ceftriaxone 2gm IV q24  Height: 6\' 4"  (193 cm) Weight: 95.4 kg (210 lb 5.1 oz) (w/ foot board) IBW/kg (Calculated) : 86.8  Temp (24hrs), Avg:98.7 F (37.1 C), Min:96.8 F (36 C), Max:99.3 F (37.4 C)  Recent Labs  Lab 07/10/23 1224 07/10/23 1403 07/10/23 1425 07/10/23 1446 07/10/23 1501 07/10/23 1708 07/10/23 1717 07/10/23 1925 07/10/23 2015 07/10/23 2217 07/10/23 2302 07/11/23 0404 07/11/23 0512 07/11/23 0737  WBC 14.3*  --  23.2*  --   --  28.0*  --   --  32.8*  --   --  29.7*  --   --   CREATININE 1.53*   < > 1.88*  --  1.80* 1.70*  --   --   --   --  2.09*  --  2.76* 3.00*  LATICACIDVEN  --   --   --  8.8*  --   --  7.5* 4.9*  --  3.7*  --  2.9*  --   --    < > = values in this interval not displayed.    Estimated Creatinine Clearance: 41 mL/min (A) (by C-G formula based on SCr of 3 mg/dL (H)).    No Known Allergies  Antimicrobials this admission:   Dose adjustments this admission:   Microbiology results:  Leota Sauers Pharm.D. CPP, BCPS Clinical Pharmacist [phone] 07/11/2023 4:04 PM

## 2023-07-11 NOTE — Procedures (Signed)
 Patient Name: Aaron [name]  MRN: [id number]  Epilepsy Attending: Charlsie Quest  Referring Physician/Provider: Romie Minus, MD  Date: 07/11/2023 Duration: 21.38 mins  Patient history: 38yo M s/p cardia arrest. EEG to evaluate for seizure  Level of alertness: comatose/ lethargic   AEDs during EEG study: Versed  Technical aspects: This EEG study was done with scalp electrodes positioned according to the 10-20 International system of electrode placement. Electrical activity was reviewed with band pass filter of 1-70Hz , sensitivity of 7 uV/mm, display speed of 73mm/sec with a 60Hz  notched filter applied as appropriate. EEG data were recorded continuously and digitally stored.  Video monitoring was available and reviewed as appropriate.  Description: EEG showed continuous generalized 3 to 6 Hz theta-delta slowing admixed with 15 to 18 Hz beta activity distributed symmetrically and diffusely. Hyperventilation and photic stimulation were not performed.     ABNORMALITY - Continuous slow, generalized  IMPRESSION: This study is suggestive of moderate diffuse encephalopathy likely related to sedation. No seizures or epileptiform discharges were seen throughout the recording.  Gracie Gupta Annabelle Harman

## 2023-07-11 NOTE — TOC Progression Note (Addendum)
 Transition of Care [facility]) - Progression Note    Patient Details  Name: Aaron [name] MRN: [id number] Date of Birth: December 11, 1985  [facility] CM/SW Contact  Elliot Cousin, RN Phone Number: [phone] 07/11/2023, 3:30 PM  Clinical Narrative:     TOC CM spoke to pt's sister, Shanda Bumps. States pt lives in home with SO, [name]. He was independent pta. States he is self-employed. Will need to arrange PCP appt.  Explained CM/CSW will continue to follow for dc needs.   Expected Discharge Plan: IP Rehab Facility Barriers to Discharge: Continued Medical Work up  Expected Discharge Plan and Services   Discharge Planning Services: CM Consult   Living arrangements for the past 2 months: Apartment                                       Social Determinants of Health (SDOH) Interventions SDOH Screenings   Food Insecurity: No Food Insecurity (07/10/2023)  Housing: Low Risk  (07/10/2023)  Transportation Needs: No Transportation Needs (07/10/2023)  Utilities: Not At Risk (07/10/2023)  Financial Resource Strain: Medium Risk (04/01/2023)  Tobacco Use: Low Risk  (07/10/2023)  Health Literacy: Adequate Health Literacy (04/01/2023)    Readmission Risk Interventions    07/10/2023   10:12 AM  Readmission Risk Prevention Plan  Transportation Screening Complete  PCP or Specialist Appt within 5-7 Days Complete  Home Care Screening Complete  Medication Review (RN CM) Complete

## 2023-07-11 NOTE — CV Procedure (Signed)
 ECMO NOTE:   Indication: Cardiogenic shock   Initial cannulation date: 07/10/23   ECMO type: VA ECMO with Impella CP vent   Dual lumen inflow/return cannula:   1) 25 FR multi-stage venous drainage RFV 2) 21 FR arterial return LCFA 3) 6FR L SFA distal perfusion catheter 4) Impella CP via R CFA LV vent    Daily data:   Flow 4.86 L RPM 3525 Sweep  2.5L   Impella at P2 2.6L flow Motor current reviewed and improved from yesterday with better controlled MAP  Labs:   ABG    Component Value Date/Time   PHART 7.394 07/11/2023 0742   PCO2ART 40.8 07/11/2023 0742   PO2ART 90 07/11/2023 0742   HCO3 24.9 07/11/2023 0742   TCO2 26 07/11/2023 0742   ACIDBASEDEF 1.0 07/10/2023 1802   O2SAT 61.1 07/11/2023 0958    Hgb 9.0 Platelets 185 LDH 1440 PTT 110 Lactic acid 2.9   Plan:  Stabilize with EC-pella support Pressors weaned to off Discussed case with CT surgery. Given hyperkalemia/ARF/coagulopathy will hold on 5.5 placement, potentially over the weekend or Monday Impella turned down to P2 given elevated LDH, hematuria Wean sedation   Romie Minus, MD  2:11 PM

## 2023-07-12 ENCOUNTER — Inpatient Hospital Stay (HOSPITAL_COMMUNITY)

## 2023-07-12 DIAGNOSIS — I2699 Other pulmonary embolism without acute cor pulmonale: Secondary | ICD-10-CM | POA: Diagnosis not present

## 2023-07-12 DIAGNOSIS — J9601 Acute respiratory failure with hypoxia: Secondary | ICD-10-CM | POA: Diagnosis not present

## 2023-07-12 DIAGNOSIS — Z515 Encounter for palliative care: Secondary | ICD-10-CM

## 2023-07-12 DIAGNOSIS — Z978 Presence of other specified devices: Secondary | ICD-10-CM | POA: Diagnosis not present

## 2023-07-12 DIAGNOSIS — R918 Other nonspecific abnormal finding of lung field: Secondary | ICD-10-CM | POA: Diagnosis not present

## 2023-07-12 DIAGNOSIS — D649 Anemia, unspecified: Secondary | ICD-10-CM | POA: Diagnosis not present

## 2023-07-12 DIAGNOSIS — I469 Cardiac arrest, cause unspecified: Secondary | ICD-10-CM | POA: Diagnosis not present

## 2023-07-12 DIAGNOSIS — I340 Nonrheumatic mitral (valve) insufficiency: Secondary | ICD-10-CM | POA: Diagnosis not present

## 2023-07-12 DIAGNOSIS — R0602 Shortness of breath: Secondary | ICD-10-CM | POA: Diagnosis not present

## 2023-07-12 DIAGNOSIS — N179 Acute kidney failure, unspecified: Secondary | ICD-10-CM | POA: Diagnosis not present

## 2023-07-12 DIAGNOSIS — N170 Acute kidney failure with tubular necrosis: Secondary | ICD-10-CM | POA: Diagnosis not present

## 2023-07-12 DIAGNOSIS — Z452 Encounter for adjustment and management of vascular access device: Secondary | ICD-10-CM | POA: Diagnosis not present

## 2023-07-12 DIAGNOSIS — R570 Cardiogenic shock: Secondary | ICD-10-CM | POA: Diagnosis not present

## 2023-07-12 DIAGNOSIS — R079 Chest pain, unspecified: Secondary | ICD-10-CM | POA: Diagnosis not present

## 2023-07-12 DIAGNOSIS — I480 Paroxysmal atrial fibrillation: Secondary | ICD-10-CM | POA: Diagnosis not present

## 2023-07-12 DIAGNOSIS — Z7189 Other specified counseling: Secondary | ICD-10-CM | POA: Diagnosis not present

## 2023-07-12 DIAGNOSIS — Z4682 Encounter for fitting and adjustment of non-vascular catheter: Secondary | ICD-10-CM | POA: Diagnosis not present

## 2023-07-12 DIAGNOSIS — E872 Acidosis, unspecified: Secondary | ICD-10-CM | POA: Diagnosis not present

## 2023-07-12 DIAGNOSIS — Z959 Presence of cardiac and vascular implant and graft, unspecified: Secondary | ICD-10-CM | POA: Diagnosis not present

## 2023-07-12 DIAGNOSIS — E875 Hyperkalemia: Secondary | ICD-10-CM | POA: Diagnosis not present

## 2023-07-12 LAB — POCT I-STAT, CHEM 8
BUN: 34 mg/dL — ABNORMAL HIGH (ref 6–20)
BUN: 34 mg/dL — ABNORMAL HIGH (ref 6–20)
BUN: 36 mg/dL — ABNORMAL HIGH (ref 6–20)
BUN: 37 mg/dL — ABNORMAL HIGH (ref 6–20)
BUN: 37 mg/dL — ABNORMAL HIGH (ref 6–20)
BUN: 37 mg/dL — ABNORMAL HIGH (ref 6–20)
BUN: 37 mg/dL — ABNORMAL HIGH (ref 6–20)
BUN: 38 mg/dL — ABNORMAL HIGH (ref 6–20)
BUN: 38 mg/dL — ABNORMAL HIGH (ref 6–20)
BUN: 40 mg/dL — ABNORMAL HIGH (ref 6–20)
BUN: 43 mg/dL — ABNORMAL HIGH (ref 6–20)
BUN: 47 mg/dL — ABNORMAL HIGH (ref 6–20)
BUN: 47 mg/dL — ABNORMAL HIGH (ref 6–20)
Calcium, Ion: 0.31 mmol/L — CL (ref 1.15–1.40)
Calcium, Ion: 0.33 mmol/L — CL (ref 1.15–1.40)
Calcium, Ion: 0.34 mmol/L — CL (ref 1.15–1.40)
Calcium, Ion: 0.35 mmol/L — CL (ref 1.15–1.40)
Calcium, Ion: 0.41 mmol/L — CL (ref 1.15–1.40)
Calcium, Ion: 0.42 mmol/L — CL (ref 1.15–1.40)
Calcium, Ion: 0.42 mmol/L — CL (ref 1.15–1.40)
Calcium, Ion: 0.44 mmol/L — CL (ref 1.15–1.40)
Calcium, Ion: 0.47 mmol/L — CL (ref 1.15–1.40)
Calcium, Ion: 0.47 mmol/L — CL (ref 1.15–1.40)
Calcium, Ion: 1.08 mmol/L — ABNORMAL LOW (ref 1.15–1.40)
Calcium, Ion: 1.08 mmol/L — ABNORMAL LOW (ref 1.15–1.40)
Calcium, Ion: 1.09 mmol/L — ABNORMAL LOW (ref 1.15–1.40)
Chloride: 100 mmol/L (ref 98–111)
Chloride: 90 mmol/L — ABNORMAL LOW (ref 98–111)
Chloride: 90 mmol/L — ABNORMAL LOW (ref 98–111)
Chloride: 91 mmol/L — ABNORMAL LOW (ref 98–111)
Chloride: 92 mmol/L — ABNORMAL LOW (ref 98–111)
Chloride: 92 mmol/L — ABNORMAL LOW (ref 98–111)
Chloride: 92 mmol/L — ABNORMAL LOW (ref 98–111)
Chloride: 93 mmol/L — ABNORMAL LOW (ref 98–111)
Chloride: 93 mmol/L — ABNORMAL LOW (ref 98–111)
Chloride: 94 mmol/L — ABNORMAL LOW (ref 98–111)
Chloride: 94 mmol/L — ABNORMAL LOW (ref 98–111)
Chloride: 97 mmol/L — ABNORMAL LOW (ref 98–111)
Chloride: 99 mmol/L (ref 98–111)
Creatinine, Ser: 2.1 mg/dL — ABNORMAL HIGH (ref 0.61–1.24)
Creatinine, Ser: 2.1 mg/dL — ABNORMAL HIGH (ref 0.61–1.24)
Creatinine, Ser: 2.1 mg/dL — ABNORMAL HIGH (ref 0.61–1.24)
Creatinine, Ser: 2.2 mg/dL — ABNORMAL HIGH (ref 0.61–1.24)
Creatinine, Ser: 2.3 mg/dL — ABNORMAL HIGH (ref 0.61–1.24)
Creatinine, Ser: 2.3 mg/dL — ABNORMAL HIGH (ref 0.61–1.24)
Creatinine, Ser: 2.3 mg/dL — ABNORMAL HIGH (ref 0.61–1.24)
Creatinine, Ser: 2.3 mg/dL — ABNORMAL HIGH (ref 0.61–1.24)
Creatinine, Ser: 2.3 mg/dL — ABNORMAL HIGH (ref 0.61–1.24)
Creatinine, Ser: 2.5 mg/dL — ABNORMAL HIGH (ref 0.61–1.24)
Creatinine, Ser: 3.5 mg/dL — ABNORMAL HIGH (ref 0.61–1.24)
Creatinine, Ser: 3.5 mg/dL — ABNORMAL HIGH (ref 0.61–1.24)
Creatinine, Ser: 3.5 mg/dL — ABNORMAL HIGH (ref 0.61–1.24)
Glucose, Bld: 172 mg/dL — ABNORMAL HIGH (ref 70–99)
Glucose, Bld: 181 mg/dL — ABNORMAL HIGH (ref 70–99)
Glucose, Bld: 192 mg/dL — ABNORMAL HIGH (ref 70–99)
Glucose, Bld: 210 mg/dL — ABNORMAL HIGH (ref 70–99)
Glucose, Bld: 217 mg/dL — ABNORMAL HIGH (ref 70–99)
Glucose, Bld: 225 mg/dL — ABNORMAL HIGH (ref 70–99)
Glucose, Bld: 227 mg/dL — ABNORMAL HIGH (ref 70–99)
Glucose, Bld: 228 mg/dL — ABNORMAL HIGH (ref 70–99)
Glucose, Bld: 230 mg/dL — ABNORMAL HIGH (ref 70–99)
Glucose, Bld: 246 mg/dL — ABNORMAL HIGH (ref 70–99)
Glucose, Bld: 247 mg/dL — ABNORMAL HIGH (ref 70–99)
Glucose, Bld: 248 mg/dL — ABNORMAL HIGH (ref 70–99)
Glucose, Bld: 261 mg/dL — ABNORMAL HIGH (ref 70–99)
HCT: 22 % — ABNORMAL LOW (ref 39.0–52.0)
HCT: 22 % — ABNORMAL LOW (ref 39.0–52.0)
HCT: 23 % — ABNORMAL LOW (ref 39.0–52.0)
HCT: 26 % — ABNORMAL LOW (ref 39.0–52.0)
HCT: 26 % — ABNORMAL LOW (ref 39.0–52.0)
HCT: 27 % — ABNORMAL LOW (ref 39.0–52.0)
HCT: 27 % — ABNORMAL LOW (ref 39.0–52.0)
HCT: 27 % — ABNORMAL LOW (ref 39.0–52.0)
HCT: 27 % — ABNORMAL LOW (ref 39.0–52.0)
HCT: 28 % — ABNORMAL LOW (ref 39.0–52.0)
HCT: 29 % — ABNORMAL LOW (ref 39.0–52.0)
HCT: 29 % — ABNORMAL LOW (ref 39.0–52.0)
HCT: 29 % — ABNORMAL LOW (ref 39.0–52.0)
Hemoglobin: 7.5 g/dL — ABNORMAL LOW (ref 13.0–17.0)
Hemoglobin: 7.5 g/dL — ABNORMAL LOW (ref 13.0–17.0)
Hemoglobin: 7.8 g/dL — ABNORMAL LOW (ref 13.0–17.0)
Hemoglobin: 8.8 g/dL — ABNORMAL LOW (ref 13.0–17.0)
Hemoglobin: 8.8 g/dL — ABNORMAL LOW (ref 13.0–17.0)
Hemoglobin: 9.2 g/dL — ABNORMAL LOW (ref 13.0–17.0)
Hemoglobin: 9.2 g/dL — ABNORMAL LOW (ref 13.0–17.0)
Hemoglobin: 9.2 g/dL — ABNORMAL LOW (ref 13.0–17.0)
Hemoglobin: 9.2 g/dL — ABNORMAL LOW (ref 13.0–17.0)
Hemoglobin: 9.5 g/dL — ABNORMAL LOW (ref 13.0–17.0)
Hemoglobin: 9.9 g/dL — ABNORMAL LOW (ref 13.0–17.0)
Hemoglobin: 9.9 g/dL — ABNORMAL LOW (ref 13.0–17.0)
Hemoglobin: 9.9 g/dL — ABNORMAL LOW (ref 13.0–17.0)
Potassium: 3.3 mmol/L — ABNORMAL LOW (ref 3.5–5.1)
Potassium: 3.4 mmol/L — ABNORMAL LOW (ref 3.5–5.1)
Potassium: 3.6 mmol/L (ref 3.5–5.1)
Potassium: 3.6 mmol/L (ref 3.5–5.1)
Potassium: 3.7 mmol/L (ref 3.5–5.1)
Potassium: 3.9 mmol/L (ref 3.5–5.1)
Potassium: 4 mmol/L (ref 3.5–5.1)
Potassium: 4.1 mmol/L (ref 3.5–5.1)
Potassium: 4.1 mmol/L (ref 3.5–5.1)
Potassium: 4.1 mmol/L (ref 3.5–5.1)
Potassium: 4.3 mmol/L (ref 3.5–5.1)
Potassium: 4.5 mmol/L (ref 3.5–5.1)
Potassium: 4.7 mmol/L (ref 3.5–5.1)
Sodium: 137 mmol/L (ref 135–145)
Sodium: 138 mmol/L (ref 135–145)
Sodium: 138 mmol/L (ref 135–145)
Sodium: 139 mmol/L (ref 135–145)
Sodium: 140 mmol/L (ref 135–145)
Sodium: 140 mmol/L (ref 135–145)
Sodium: 140 mmol/L (ref 135–145)
Sodium: 140 mmol/L (ref 135–145)
Sodium: 140 mmol/L (ref 135–145)
Sodium: 140 mmol/L (ref 135–145)
Sodium: 141 mmol/L (ref 135–145)
Sodium: 141 mmol/L (ref 135–145)
Sodium: 141 mmol/L (ref 135–145)
TCO2: 27 mmol/L (ref 22–32)
TCO2: 29 mmol/L (ref 22–32)
TCO2: 30 mmol/L (ref 22–32)
TCO2: 31 mmol/L (ref 22–32)
TCO2: 32 mmol/L (ref 22–32)
TCO2: 32 mmol/L (ref 22–32)
TCO2: 32 mmol/L (ref 22–32)
TCO2: 32 mmol/L (ref 22–32)
TCO2: 34 mmol/L — ABNORMAL HIGH (ref 22–32)
TCO2: 35 mmol/L — ABNORMAL HIGH (ref 22–32)
TCO2: 35 mmol/L — ABNORMAL HIGH (ref 22–32)
TCO2: 35 mmol/L — ABNORMAL HIGH (ref 22–32)
TCO2: 36 mmol/L — ABNORMAL HIGH (ref 22–32)

## 2023-07-12 LAB — POCT I-STAT 7, (LYTES, BLD GAS, ICA,H+H)
Acid-Base Excess: 7 mmol/L — ABNORMAL HIGH (ref 0.0–2.0)
Acid-Base Excess: 8 mmol/L — ABNORMAL HIGH (ref 0.0–2.0)
Acid-Base Excess: 9 mmol/L — ABNORMAL HIGH (ref 0.0–2.0)
Acid-Base Excess: 11 mmol/L — ABNORMAL HIGH (ref 0.0–2.0)
Acid-Base Excess: 12 mmol/L — ABNORMAL HIGH (ref 0.0–2.0)
Acid-Base Excess: 13 mmol/L — ABNORMAL HIGH (ref 0.0–2.0)
Acid-Base Excess: 14 mmol/L — ABNORMAL HIGH (ref 0.0–2.0)
Acid-Base Excess: 14 mmol/L — ABNORMAL HIGH (ref 0.0–2.0)
Bicarbonate: 30.7 mmol/L — ABNORMAL HIGH (ref 20.0–28.0)
Bicarbonate: 31.9 mmol/L — ABNORMAL HIGH (ref 20.0–28.0)
Bicarbonate: 32.9 mmol/L — ABNORMAL HIGH (ref 20.0–28.0)
Bicarbonate: 34.7 mmol/L — ABNORMAL HIGH (ref 20.0–28.0)
Bicarbonate: 36.1 mmol/L — ABNORMAL HIGH (ref 20.0–28.0)
Bicarbonate: 36.7 mmol/L — ABNORMAL HIGH (ref 20.0–28.0)
Bicarbonate: 38.5 mmol/L — ABNORMAL HIGH (ref 20.0–28.0)
Bicarbonate: 39.2 mmol/L — ABNORMAL HIGH (ref 20.0–28.0)
Calcium, Ion: 1.09 mmol/L — ABNORMAL LOW (ref 1.15–1.40)
Calcium, Ion: 1.07 mmol/L — ABNORMAL LOW (ref 1.15–1.40)
Calcium, Ion: 1.1 mmol/L — ABNORMAL LOW (ref 1.15–1.40)
Calcium, Ion: 1.14 mmol/L — ABNORMAL LOW (ref 1.15–1.40)
Calcium, Ion: 1.07 mmol/L — ABNORMAL LOW (ref 1.15–1.40)
Calcium, Ion: 1.06 mmol/L — ABNORMAL LOW (ref 1.15–1.40)
Calcium, Ion: 1.06 mmol/L — ABNORMAL LOW (ref 1.15–1.40)
Calcium, Ion: 1.03 mmol/L — ABNORMAL LOW (ref 1.15–1.40)
HCT: 25 % — ABNORMAL LOW (ref 39.0–52.0)
HCT: 25 % — ABNORMAL LOW (ref 39.0–52.0)
HCT: 25 % — ABNORMAL LOW (ref 39.0–52.0)
HCT: 26 % — ABNORMAL LOW (ref 39.0–52.0)
HCT: 26 % — ABNORMAL LOW (ref 39.0–52.0)
HCT: 26 % — ABNORMAL LOW (ref 39.0–52.0)
HCT: 26 % — ABNORMAL LOW (ref 39.0–52.0)
HCT: 25 % — ABNORMAL LOW (ref 39.0–52.0)
Hemoglobin: 8.5 g/dL — ABNORMAL LOW (ref 13.0–17.0)
Hemoglobin: 8.5 g/dL — ABNORMAL LOW (ref 13.0–17.0)
Hemoglobin: 8.5 g/dL — ABNORMAL LOW (ref 13.0–17.0)
Hemoglobin: 8.8 g/dL — ABNORMAL LOW (ref 13.0–17.0)
Hemoglobin: 8.8 g/dL — ABNORMAL LOW (ref 13.0–17.0)
Hemoglobin: 8.8 g/dL — ABNORMAL LOW (ref 13.0–17.0)
Hemoglobin: 8.8 g/dL — ABNORMAL LOW (ref 13.0–17.0)
Hemoglobin: 8.5 g/dL — ABNORMAL LOW (ref 13.0–17.0)
O2 Saturation: 100 %
O2 Saturation: 99 %
O2 Saturation: 97 %
O2 Saturation: 95 %
O2 Saturation: 98 %
O2 Saturation: 99 %
O2 Saturation: 100 %
O2 Saturation: 100 %
Patient temperature: 36.6
Patient temperature: 36.6
Patient temperature: 36.7
Patient temperature: 36.9
Patient temperature: 36.7
Patient temperature: 36.7
Patient temperature: 36.6
Patient temperature: 36.7
Potassium: 4.4 mmol/L (ref 3.5–5.1)
Potassium: 4 mmol/L (ref 3.5–5.1)
Potassium: 4 mmol/L (ref 3.5–5.1)
Potassium: 4.1 mmol/L (ref 3.5–5.1)
Potassium: 4.1 mmol/L (ref 3.5–5.1)
Potassium: 4.3 mmol/L (ref 3.5–5.1)
Potassium: 4.4 mmol/L (ref 3.5–5.1)
Potassium: 4.1 mmol/L (ref 3.5–5.1)
Sodium: 136 mmol/L (ref 135–145)
Sodium: 138 mmol/L (ref 135–145)
Sodium: 137 mmol/L (ref 135–145)
Sodium: 137 mmol/L (ref 135–145)
Sodium: 138 mmol/L (ref 135–145)
Sodium: 138 mmol/L (ref 135–145)
Sodium: 138 mmol/L (ref 135–145)
Sodium: 139 mmol/L (ref 135–145)
TCO2: 32 mmol/L (ref 22–32)
TCO2: 33 mmol/L — ABNORMAL HIGH (ref 22–32)
TCO2: 34 mmol/L — ABNORMAL HIGH (ref 22–32)
TCO2: 36 mmol/L — ABNORMAL HIGH (ref 22–32)
TCO2: 37 mmol/L — ABNORMAL HIGH (ref 22–32)
TCO2: 38 mmol/L — ABNORMAL HIGH (ref 22–32)
TCO2: 40 mmol/L — ABNORMAL HIGH (ref 22–32)
TCO2: 41 mmol/L — ABNORMAL HIGH (ref 22–32)
pCO2 arterial: 36.7 mmHg (ref 32–48)
pCO2 arterial: 37.7 mmHg (ref 32–48)
pCO2 arterial: 39 mmHg (ref 32–48)
pCO2 arterial: 43.8 mmHg (ref 32–48)
pCO2 arterial: 44.4 mmHg (ref 32–48)
pCO2 arterial: 44.7 mmHg (ref 32–48)
pCO2 arterial: 49.4 mmHg — ABNORMAL HIGH (ref 32–48)
pCO2 arterial: 50.8 mmHg — ABNORMAL HIGH (ref 32–48)
pH, Arterial: 7.529 — ABNORMAL HIGH (ref 7.35–7.45)
pH, Arterial: 7.534 — ABNORMAL HIGH (ref 7.35–7.45)
pH, Arterial: 7.533 — ABNORMAL HIGH (ref 7.35–7.45)
pH, Arterial: 7.506 — ABNORMAL HIGH (ref 7.35–7.45)
pH, Arterial: 7.516 — ABNORMAL HIGH (ref 7.35–7.45)
pH, Arterial: 7.521 — ABNORMAL HIGH (ref 7.35–7.45)
pH, Arterial: 7.499 — ABNORMAL HIGH (ref 7.35–7.45)
pH, Arterial: 7.494 — ABNORMAL HIGH (ref 7.35–7.45)
pO2, Arterial: 156 mmHg — ABNORMAL HIGH (ref 83–108)
pO2, Arterial: 111 mmHg — ABNORMAL HIGH (ref 83–108)
pO2, Arterial: 82 mmHg — ABNORMAL LOW (ref 83–108)
pO2, Arterial: 70 mmHg — ABNORMAL LOW (ref 83–108)
pO2, Arterial: 87 mmHg (ref 83–108)
pO2, Arterial: 138 mmHg — ABNORMAL HIGH (ref 83–108)
pO2, Arterial: 181 mmHg — ABNORMAL HIGH (ref 83–108)
pO2, Arterial: 198 mmHg — ABNORMAL HIGH (ref 83–108)

## 2023-07-12 LAB — CBC
HCT: 23 % — ABNORMAL LOW (ref 39.0–52.0)
HCT: 23.6 % — ABNORMAL LOW (ref 39.0–52.0)
HCT: 23.9 % — ABNORMAL LOW (ref 39.0–52.0)
HCT: 24.1 % — ABNORMAL LOW (ref 39.0–52.0)
Hemoglobin: 7.8 g/dL — ABNORMAL LOW (ref 13.0–17.0)
Hemoglobin: 7.8 g/dL — ABNORMAL LOW (ref 13.0–17.0)
Hemoglobin: 7.8 g/dL — ABNORMAL LOW (ref 13.0–17.0)
Hemoglobin: 8 g/dL — ABNORMAL LOW (ref 13.0–17.0)
MCH: 23.4 pg — ABNORMAL LOW (ref 26.0–34.0)
MCH: 24.3 pg — ABNORMAL LOW (ref 26.0–34.0)
MCH: 24.5 pg — ABNORMAL LOW (ref 26.0–34.0)
MCH: 25.3 pg — ABNORMAL LOW (ref 26.0–34.0)
MCHC: 32.4 g/dL (ref 30.0–36.0)
MCHC: 33.1 g/dL (ref 30.0–36.0)
MCHC: 33.5 g/dL (ref 30.0–36.0)
MCHC: 33.9 g/dL (ref 30.0–36.0)
MCV: 72.4 fL — ABNORMAL LOW (ref 80.0–100.0)
MCV: 73.3 fL — ABNORMAL LOW (ref 80.0–100.0)
MCV: 73.5 fL — ABNORMAL LOW (ref 80.0–100.0)
MCV: 74.7 fL — ABNORMAL LOW (ref 80.0–100.0)
Platelets: 118 10*3/uL — ABNORMAL LOW (ref 150–400)
Platelets: 91 10*3/uL — ABNORMAL LOW (ref 150–400)
Platelets: 94 10*3/uL — ABNORMAL LOW (ref 150–400)
Platelets: 96 10*3/uL — ABNORMAL LOW (ref 150–400)
RBC: 3.08 MIL/uL — ABNORMAL LOW (ref 4.22–5.81)
RBC: 3.21 MIL/uL — ABNORMAL LOW (ref 4.22–5.81)
RBC: 3.26 MIL/uL — ABNORMAL LOW (ref 4.22–5.81)
RBC: 3.33 MIL/uL — ABNORMAL LOW (ref 4.22–5.81)
RDW: 17.8 % — ABNORMAL HIGH (ref 11.5–15.5)
RDW: 18.3 % — ABNORMAL HIGH (ref 11.5–15.5)
RDW: 18.5 % — ABNORMAL HIGH (ref 11.5–15.5)
RDW: 18.8 % — ABNORMAL HIGH (ref 11.5–15.5)
WBC: 27.3 10*3/uL — ABNORMAL HIGH (ref 4.0–10.5)
WBC: 28.7 10*3/uL — ABNORMAL HIGH (ref 4.0–10.5)
WBC: 29.9 10*3/uL — ABNORMAL HIGH (ref 4.0–10.5)
WBC: 32.3 10*3/uL — ABNORMAL HIGH (ref 4.0–10.5)
nRBC: 1.6 % — ABNORMAL HIGH (ref 0.0–0.2)
nRBC: 1.8 % — ABNORMAL HIGH (ref 0.0–0.2)
nRBC: 2 % — ABNORMAL HIGH (ref 0.0–0.2)
nRBC: 2.1 % — ABNORMAL HIGH (ref 0.0–0.2)

## 2023-07-12 LAB — RENAL FUNCTION PANEL
Albumin: 1.9 g/dL — ABNORMAL LOW (ref 3.5–5.0)
Anion gap: 12 (ref 5–15)
BUN: 35 mg/dL — ABNORMAL HIGH (ref 6–20)
CO2: 28 mmol/L (ref 22–32)
Calcium: 8.9 mg/dL (ref 8.9–10.3)
Chloride: 97 mmol/L — ABNORMAL LOW (ref 98–111)
Creatinine, Ser: 3.01 mg/dL — ABNORMAL HIGH (ref 0.61–1.24)
GFR, Estimated: 26 mL/min — ABNORMAL LOW (ref >60)
Glucose, Bld: 204 mg/dL — ABNORMAL HIGH (ref 70–99)
Phosphorus: 4.5 mg/dL (ref 2.5–4.6)
Potassium: 4.4 mmol/L (ref 3.5–5.1)
Sodium: 137 mmol/L (ref 135–145)

## 2023-07-12 LAB — BASIC METABOLIC PANEL
Anion gap: 14 (ref 5–15)
BUN: 38 mg/dL — ABNORMAL HIGH (ref 6–20)
CO2: 25 mmol/L (ref 22–32)
Calcium: 9.2 mg/dL (ref 8.9–10.3)
Chloride: 99 mmol/L (ref 98–111)
Creatinine, Ser: 3.03 mg/dL — ABNORMAL HIGH (ref 0.61–1.24)
GFR, Estimated: 26 mL/min — ABNORMAL LOW (ref >60)
Glucose, Bld: 197 mg/dL — ABNORMAL HIGH (ref 70–99)
Potassium: 4.6 mmol/L (ref 3.5–5.1)
Sodium: 138 mmol/L (ref 135–145)

## 2023-07-12 LAB — HEPATIC FUNCTION PANEL
ALT: 472 U/L — ABNORMAL HIGH (ref 0–44)
ALT: 382 U/L — ABNORMAL HIGH (ref 0–44)
AST: 1324 U/L — ABNORMAL HIGH (ref 15–41)
AST: 1108 U/L — ABNORMAL HIGH (ref 15–41)
Albumin: 2.1 g/dL — ABNORMAL LOW (ref 3.5–5.0)
Albumin: 1.9 g/dL — ABNORMAL LOW (ref 3.5–5.0)
Alkaline Phosphatase: 71 U/L (ref 38–126)
Alkaline Phosphatase: 78 U/L (ref 38–126)
Bilirubin, Direct: 3.2 mg/dL — ABNORMAL HIGH (ref 0.0–0.2)
Bilirubin, Direct: 2.6 mg/dL — ABNORMAL HIGH (ref 0.0–0.2)
Indirect Bilirubin: 5.4 mg/dL — ABNORMAL HIGH (ref 0.3–0.9)
Indirect Bilirubin: 4.8 mg/dL — ABNORMAL HIGH (ref 0.3–0.9)
Total Bilirubin: 8.6 mg/dL — ABNORMAL HIGH (ref 0.0–1.2)
Total Bilirubin: 7.4 mg/dL — ABNORMAL HIGH (ref 0.0–1.2)
Total Protein: 4.6 g/dL — ABNORMAL LOW (ref 6.5–8.1)
Total Protein: 4.4 g/dL — ABNORMAL LOW (ref 6.5–8.1)

## 2023-07-12 LAB — GLUCOSE, CAPILLARY
Glucose-Capillary: 173 mg/dL — ABNORMAL HIGH (ref 70–99)
Glucose-Capillary: 176 mg/dL — ABNORMAL HIGH (ref 70–99)
Glucose-Capillary: 182 mg/dL — ABNORMAL HIGH (ref 70–99)
Glucose-Capillary: 186 mg/dL — ABNORMAL HIGH (ref 70–99)
Glucose-Capillary: 199 mg/dL — ABNORMAL HIGH (ref 70–99)
Glucose-Capillary: 204 mg/dL — ABNORMAL HIGH (ref 70–99)

## 2023-07-12 LAB — PREPARE RBC (CROSSMATCH)

## 2023-07-12 LAB — LACTATE DEHYDROGENASE
LDH: >2500 U/L — ABNORMAL HIGH (ref 98–192)
LDH: >2500 U/L — ABNORMAL HIGH (ref 98–192)

## 2023-07-12 LAB — APTT
aPTT: 61 s — ABNORMAL HIGH (ref 24–36)
aPTT: 62 s — ABNORMAL HIGH (ref 24–36)
aPTT: 62 s — ABNORMAL HIGH (ref 24–36)
aPTT: 67 s — ABNORMAL HIGH (ref 24–36)

## 2023-07-12 LAB — CG4 I-STAT (LACTIC ACID)
Lactic Acid, Venous: 2 mmol/L (ref 0.5–1.9)
Lactic Acid, Venous: 2.4 mmol/L (ref 0.5–1.9)
Lactic Acid, Venous: 3.1 mmol/L (ref 0.5–1.9)

## 2023-07-12 LAB — COOXEMETRY PANEL
Carboxyhemoglobin: 6.1 % (ref 0.5–1.5)
Methemoglobin: 3.2 % — ABNORMAL HIGH (ref 0.0–1.5)
O2 Saturation: 75.2 %
Total hemoglobin: 7.7 g/dL — ABNORMAL LOW (ref 12.0–16.0)

## 2023-07-12 LAB — T3, FREE: T3, Free: 4.2 pg/mL (ref 2.0–4.4)

## 2023-07-12 LAB — MAGNESIUM
Magnesium: 1.7 mg/dL (ref 1.7–2.4)
Magnesium: 2.2 mg/dL (ref 1.7–2.4)

## 2023-07-12 LAB — T4, FREE: Free T4: 1.51 ng/dL — ABNORMAL HIGH (ref 0.61–1.12)

## 2023-07-12 LAB — PROTIME-INR
INR: 2.6 — ABNORMAL HIGH (ref 0.8–1.2)
Prothrombin Time: 28.1 s — ABNORMAL HIGH (ref 11.4–15.2)

## 2023-07-12 LAB — PHOSPHORUS: Phosphorus: 5.6 mg/dL — ABNORMAL HIGH (ref 2.5–4.6)

## 2023-07-12 LAB — TSH: TSH: 0.016 u[IU]/mL — ABNORMAL LOW (ref 0.350–4.500)

## 2023-07-12 LAB — FIBRINOGEN: Fibrinogen: 277 mg/dL (ref 210–475)

## 2023-07-12 MED ORDER — INSULIN ASPART 100 UNIT/ML IJ SOLN
0.0000 [IU] | INTRAMUSCULAR | Status: DC
  Administered 2023-07-12: 4 [IU] via SUBCUTANEOUS
  Administered 2023-07-12: 7 [IU] via SUBCUTANEOUS
  Administered 2023-07-13 (×2): 4 [IU] via SUBCUTANEOUS
  Administered 2023-07-13: 7 [IU] via SUBCUTANEOUS
  Administered 2023-07-13: 3 [IU] via SUBCUTANEOUS
  Administered 2023-07-13: 7 [IU] via SUBCUTANEOUS
  Administered 2023-07-13 – 2023-07-14 (×2): 4 [IU] via SUBCUTANEOUS
  Administered 2023-07-14: 7 [IU] via SUBCUTANEOUS
  Administered 2023-07-14 (×3): 4 [IU] via SUBCUTANEOUS
  Administered 2023-07-15: 3 [IU] via SUBCUTANEOUS
  Administered 2023-07-15 (×2): 4 [IU] via SUBCUTANEOUS
  Administered 2023-07-15: 3 [IU] via SUBCUTANEOUS
  Administered 2023-07-15 (×2): 4 [IU] via SUBCUTANEOUS
  Administered 2023-07-16 (×2): 3 [IU] via SUBCUTANEOUS
  Administered 2023-07-16: 4 [IU] via SUBCUTANEOUS
  Administered 2023-07-16: 3 [IU] via SUBCUTANEOUS
  Administered 2023-07-17: 11 [IU] via SUBCUTANEOUS
  Administered 2023-07-17: 3 [IU] via SUBCUTANEOUS
  Administered 2023-07-17: 11 [IU] via SUBCUTANEOUS
  Administered 2023-07-17 – 2023-07-18 (×3): 3 [IU] via SUBCUTANEOUS
  Administered 2023-07-18 (×3): 4 [IU] via SUBCUTANEOUS
  Administered 2023-07-18: 3 [IU] via SUBCUTANEOUS
  Administered 2023-07-19 (×2): 4 [IU] via SUBCUTANEOUS
  Administered 2023-07-19: 3 [IU] via SUBCUTANEOUS
  Administered 2023-07-19: 7 [IU] via SUBCUTANEOUS
  Administered 2023-07-19 – 2023-07-20 (×2): 4 [IU] via SUBCUTANEOUS
  Administered 2023-07-20 – 2023-07-22 (×12): 3 [IU] via SUBCUTANEOUS
  Administered 2023-07-22: 4 [IU] via SUBCUTANEOUS
  Administered 2023-07-22 – 2023-07-23 (×4): 3 [IU] via SUBCUTANEOUS
  Administered 2023-07-23 (×2): 4 [IU] via SUBCUTANEOUS
  Administered 2023-07-24 – 2023-07-25 (×5): 3 [IU] via SUBCUTANEOUS
  Administered 2023-07-26: 4 [IU] via SUBCUTANEOUS
  Administered 2023-07-26 – 2023-07-27 (×6): 3 [IU] via SUBCUTANEOUS
  Administered 2023-07-27 (×2): 4 [IU] via SUBCUTANEOUS
  Administered 2023-07-27: 3 [IU] via SUBCUTANEOUS
  Administered 2023-07-28: 4 [IU] via SUBCUTANEOUS
  Administered 2023-07-28: 3 [IU] via SUBCUTANEOUS
  Administered 2023-07-28: 4 [IU] via SUBCUTANEOUS
  Administered 2023-07-28: 3 [IU] via SUBCUTANEOUS
  Administered 2023-07-28: 4 [IU] via SUBCUTANEOUS
  Administered 2023-07-29 (×3): 3 [IU] via SUBCUTANEOUS
  Administered 2023-07-29 – 2023-07-30 (×5): 4 [IU] via SUBCUTANEOUS
  Administered 2023-07-30 (×2): 3 [IU] via SUBCUTANEOUS
  Administered 2023-07-30 – 2023-07-31 (×2): 4 [IU] via SUBCUTANEOUS
  Administered 2023-07-31: 3 [IU] via SUBCUTANEOUS
  Administered 2023-07-31: 4 [IU] via SUBCUTANEOUS
  Administered 2023-07-31: 3 [IU] via SUBCUTANEOUS
  Administered 2023-08-01: 4 [IU] via SUBCUTANEOUS
  Administered 2023-08-01 (×2): 3 [IU] via SUBCUTANEOUS
  Administered 2023-08-01: 2 [IU] via SUBCUTANEOUS
  Administered 2023-08-02 (×3): 3 [IU] via SUBCUTANEOUS

## 2023-07-12 MED ORDER — VITAL HIGH PROTEIN PO LIQD
1000.0000 mL | ORAL | Status: AC
  Administered 2023-07-12: 1000 mL

## 2023-07-12 MED ORDER — PRISMASOL BGK 4/2.5 32-4-2.5 MEQ/L EC SOLN: Status: DC

## 2023-07-12 MED ORDER — SORBITOL 70 % SOLN
30.0000 mL | Freq: Once | Status: AC
  Administered 2023-07-12: 30 mL
  Filled 2023-07-12: qty 30

## 2023-07-12 MED ORDER — ROCURONIUM BROMIDE 10 MG/ML (PF) SYRINGE
PREFILLED_SYRINGE | INTRAVENOUS | Status: AC
  Administered 2023-07-12: 100 mg
  Filled 2023-07-12: qty 10

## 2023-07-12 MED ORDER — ORAL CARE MOUTH RINSE: 15.0000 mL | OROMUCOSAL | Status: DC | PRN

## 2023-07-12 MED ORDER — ORAL CARE MOUTH RINSE
15.0000 mL | OROMUCOSAL | Status: DC
  Administered 2023-07-12 – 2023-07-24 (×138): 15 mL via OROMUCOSAL

## 2023-07-12 MED ORDER — MAGNESIUM SULFATE 2 GM/50ML IV SOLN
2.0000 g | Freq: Once | INTRAVENOUS | Status: AC
  Administered 2023-07-12: 2 g via INTRAVENOUS
  Filled 2023-07-12: qty 50

## 2023-07-12 MED ORDER — SODIUM CHLORIDE 0.9% IV SOLUTION: Freq: Once | INTRAVENOUS | Status: AC

## 2023-07-12 MED ORDER — INSULIN ASPART 100 UNIT/ML IJ SOLN
0.0000 [IU] | INTRAMUSCULAR | Status: DC
  Administered 2023-07-12 (×2): 3 [IU] via SUBCUTANEOUS

## 2023-07-12 MED ORDER — VASOPRESSIN 20 UNITS/100 ML INFUSION FOR SHOCK
0.0000 [IU]/min | INTRAVENOUS | Status: DC
  Administered 2023-07-12: 0.03 [IU]/min via INTRAVENOUS
  Administered 2023-07-12: 0.02 [IU]/min via INTRAVENOUS
  Filled 2023-07-12 (×2): qty 100

## 2023-07-12 MED ORDER — MIDAZOLAM HCL 2 MG/2ML IJ SOLN
2.0000 mg | INTRAMUSCULAR | Status: DC | PRN
  Administered 2023-07-12 – 2023-07-13 (×2): 4 mg via INTRAVENOUS
  Administered 2023-07-13 (×3): 2 mg via INTRAVENOUS
  Administered 2023-07-14 (×2): 4 mg via INTRAVENOUS
  Administered 2023-07-14: 2 mg via INTRAVENOUS
  Administered 2023-07-14 – 2023-07-16 (×6): 4 mg via INTRAVENOUS
  Administered 2023-07-16: 2 mg via INTRAVENOUS
  Filled 2023-07-12 (×3): qty 4
  Filled 2023-07-12: qty 2
  Filled 2023-07-12 (×4): qty 4
  Filled 2023-07-12 (×3): qty 2
  Filled 2023-07-12 (×6): qty 4

## 2023-07-12 MED ORDER — SODIUM CHLORIDE 0.9% IV SOLUTION: Freq: Once | INTRAVENOUS | Status: DC

## 2023-07-12 MED ORDER — VITAL HIGH PROTEIN PO LIQD
1000.0000 mL | ORAL | Status: DC
  Administered 2023-07-12 – 2023-07-13 (×3): 1000 mL

## 2023-07-12 MED ORDER — ROCURONIUM BROMIDE 10 MG/ML (PF) SYRINGE
1.0000 mg/kg | PREFILLED_SYRINGE | INTRAVENOUS | Status: DC | PRN
  Administered 2023-07-14: 88.7 mg via INTRAVENOUS
  Filled 2023-07-12 (×2): qty 10

## 2023-07-12 NOTE — Progress Notes (Addendum)
 PHARMACY - ANTICOAGULATION CONSULT NOTE  Pharmacy Consult for bivalirudin Indication:  ECMO, Impella  No Known Allergies  Patient Measurements: Height: 6\' 4"  (193 cm) Weight: 88.7 kg (195 lb 8.8 oz) IBW/kg (Calculated) : 86.8 Heparin Dosing Weight: n/a  Vital Signs: Temp: 97.9 F (36.6 C) (03/01 0700) Temp Source: Core (03/01 0131) BP: 86/73 (03/01 0131) Pulse Rate: 132 (03/01 0700)  Labs: Recent Labs    07/09/23 2047 07/10/23 0644 07/10/23 1224 07/10/23 1310 07/10/23 1425 07/10/23 1428 07/10/23 1708 07/10/23 1709 07/10/23 2015 07/10/23 2034 07/10/23 2238 07/10/23 2302 07/11/23 0404 07/11/23 0407 07/11/23 0512 07/11/23 0540 07/11/23 1635 07/11/23 1654 07/11/23 1700 07/11/23 1853 07/11/23 1854 07/11/23 2024 07/11/23 2340 07/12/23 0000 07/12/23 0022 07/12/23 0427 07/12/23 0440 07/12/23 0448 07/12/23 0555 07/12/23 0602 07/12/23 0606  HGB  --    < > 10.6*   < > 10.1*   < > 10.0*   < > 9.6*   < >  --    < > 9.0*   < >  --    < >  --    < > 7.9*   < >  --    < > 7.8*  --    < > 8.0*   < > 9.2*  --  7.5* 8.8*  HCT  --    < > 37.0*   < > 33.7*   < > 31.3*   < > 30.1*   < >  --    < > 28.0*   < >  --    < >  --    < > 25.9*   < >  --    < > 24.1*  --    < > 23.9*   < > 27.0*  --  22.0* 26.0*  PLT  --    < > 269  --  199  --  199  --  210  --   --   --  185  --   --   --   --   --  152  --   --   --  118*  --   --  96*  --   --   --   --   --   APTT  --   --   --   --  134*   < >  --   --  76*  --  50*  --   --   --   --    < > 65*  --   --   --   --   --   --  67*  --   --   --   --  62*  --   --   LABPROT  --   --  31.8*  --   --   --  42.0*  --  36.0*  --   --   --  45.2*  --   --   --   --   --   --   --   --   --   --   --   --  28.1*  --   --   --   --   --   INR  --   --  3.1*  --   --   --  4.4*  --  3.6*  --   --   --  4.8*  --   --   --   --   --   --   --   --   --   --   --   --  2.6*  --   --   --   --   --   HEPARINUNFRC  --    < > <0.10*  --   --   --   0.23*  --   --   --  <0.10*  --   --   --   --   --   --   --   --   --   --   --   --   --   --   --   --   --   --   --   --   CREATININE  --    < > 1.53*   < > 1.88*   < > 1.70*  --   --   --   --    < >  --   --  2.76*   < >  --    < >  --    < >  --    < >  --   --    < > 3.03*  --  2.10*  --  3.50* 2.10*  CKTOTAL  --   --   --   --   --   --   --   --   --   --   --   --   --   --  117  --   --   --   --   --  357  --   --   --   --   --   --   --   --   --   --   TROPONINIHS 56*  --  263*  --  597*  --   --   --   --   --   --   --   --   --   --   --   --   --   --   --   --   --   --   --   --   --   --   --   --   --   --    < > = values in this interval not displayed.    Estimated Creatinine Clearance: 58.6 mL/min (A) (by C-G formula based on SCr of 2.1 mg/dL (H)).   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 Pharmacy consulted for bivalirudin for anticoagulation.   aPTT is therapeutic at 62, on bivalirudin 0.03 mg/kg/hr. Hgb 7.8, Plt 94, LDH >2500, fibrinogen 277. Minor bleeding in ET tube when suctioned. Old drainage on both cannulation sites - now improved. Hematuria stable, not worsening. Remains on CRRT - requiring citrate. No fibrin in ECMO circuit.   Goal of Therapy:  aPTT 50-70 seconds Monitor platelets by anticoagulation protocol: Yes   Plan:  Continue bivalirudin 0.03mg /kg/hr (using dose weight 97.5 kg) aPTT q12h F/u CBC, LDH, fibrinogen Monitor s/s bleeding   Thank you for allowing pharmacy to participate in this patient's care,  Sherron Monday, PharmD, BCCCP Clinical Pharmacist  Phone: [phone] 07/12/2023 7:20 AM  Please check AMION for all [facility] Pharmacy phone numbers After 10:00 PM, call Main Pharmacy [phone]

## 2023-07-12 NOTE — Procedures (Signed)
 Intubation Procedure Note  DEARIS DANIS  [id number]  08/19/1985  Date:07/12/23  Time:6:55 PM   Provider Performing:Krosby Ritchie    Procedure: Intubation (31500)  Indication(s) Respiratory Failure  Consent Risks of the procedure as well as the alternatives and risks of each were explained to the patient and/or caregiver.  Consent for the procedure was obtained and is signed in the bedside chart   Anesthesia Etomidate and Rocuronium   Time Out Verified patient identification, verified procedure, site/side was marked, verified correct patient position, special equipment/implants available, medications/allergies/relevant history reviewed, required imaging and test results available.   Sterile Technique Usual hand hygeine, masks, and gloves were used   Procedure Description Patient positioned in bed supine.  Sedation given as noted above.  Patient was intubated with endotracheal tube using  DL .  View was Grade 1 full glottis .  Number of attempts was 1.  Colorimetric CO2 detector was consistent with tracheal placement.   Complications/Tolerance None; patient tolerated the procedure well. Chest X-ray is ordered to verify placement.   EBL Minimal   Specimen(s) None

## 2023-07-12 NOTE — Progress Notes (Signed)
 [facility] Progress Note    Assessment/ Plan:   AKI -Baseline normal creatinine. Likely ATN in the context of cardiac arrest and shock. No obstruction on pan-CT.  -Hyperkalemia refractory to treatment, therefore started on CRRT by her ECMO circuit 2/28.  Keep net even to net -50 cc an hour (as tolerated). A/C: citrate. All 2k bags, changing dialysate to 4k as below -will continue with CRRT for today at least -Avoid nephrotoxic medications including NSAIDs and iodinated intravenous contrast exposure unless the latter is absolutely indicated.  Preferred narcotic agents for pain control are hydromorphone, fentanyl, and methadone. Morphine should not be used. Avoid Baclofen and avoid oral sodium phosphate and magnesium citrate based laxatives / bowel preps. Continue strict Input and Output monitoring. Will monitor the patient closely with you and intervene or adjust therapy as indicated by changes in clinical status/labs    Refractory hyperkalemia-resolved Now hypokalemia -CRRT as above, changing dialysate to 4k -monitor serial K   VT+PEA cardiac arrest -VA ECMO+Impella start 2/27. Per primary service+ECMO team   PE -On Angiomax   AHRF/VDRF -Vent Per CCM   Cardiogenic shock -pressor support per CCM   Elevated LFTs -Suspect shock liver, monitor   Anemia -Transfuse as needed   Lactic acidosis -secondary shock -bicarb support thru CRRT  Discussed with AHF, CCM, and ICU RN. Discussed with fiance at the bedside.  Subjective:   No acute events. Seen on CRRT. Tolerating CRRT Uop tapering off now. Total uop ~0.7L Pressors: vaso Net + ~26cc   Objective:   BP (!) 86/73   Pulse (!) 132   Temp 97.9 F (36.6 C)   Resp 12   Ht 6\' 4"  (1.93 m)   Wt 88.7 kg   SpO2 100%   BMI 23.80 kg/m   Intake/Output Summary (Last 24 hours) at 07/12/2023 6962 Last data filed at 07/12/2023 0800 Gross per 24 hour  Intake 3189.48 ml  Output 3228.7 ml  Net -39.22 ml   Weight  change: -6.7 kg  Physical Exam: Gen: ill appearing, sedated CVS: tachycardic Resp: decreased breath sounds bibasilar, intubated, bl chest expansion Abd: soft Ext: trace edema b/l Les Neuro: sedated Dialysis access: CRRT thru ecmo circuit  Imaging: VAS [place] LOWER EXTREMITY ARTERIAL DUPLEX Result Date: 07/11/2023 LOWER EXTREMITY ARTERIAL DUPLEX STUDY Patient Name:  Aaron [name]  Date of Exam:   07/11/2023 Medical Rec #: [id number]     Accession #:    [id number] Date of Birth: Jun 02, 1985     Patient Gender: M Patient Age:   38 years Exam Location:  [facility] Procedure:      VAS [place] LOWER EXTREMITY ARTERIAL DUPLEX Referring Phys: Clearnce Hasten --------------------------------------------------------------------------------  Indications: Peripheral artery disease.  Vascular Interventions: ECMO cannulation 07/10/23. Current ABI:            N/A Limitations: ECMO Comparison Study: None. Performing Technologist: Shona Simpson  Examination Guidelines: A complete evaluation includes B-mode imaging, spectral Doppler, color Doppler, and power Doppler as needed of all accessible portions of each vessel. Bilateral testing is considered an integral part of a complete examination. Limited examinations for reoccurring indications may be performed as noted.  +----------+--------+-----+--------+----------+--------+ RIGHT     PSV cm/sRatioStenosisWaveform  Comments +----------+--------+-----+--------+----------+--------+ DFA       85                   monophasic         +----------+--------+-----+--------+----------+--------+ SFA Prox  48  monophasic         +----------+--------+-----+--------+----------+--------+ SFA Mid   73                   monophasic         +----------+--------+-----+--------+----------+--------+ SFA Distal44                   monophasic         +----------+--------+-----+--------+----------+--------+ POP Prox  51                    monophasic         +----------+--------+-----+--------+----------+--------+ POP Distal40                   monophasic         +----------+--------+-----+--------+----------+--------+ ATA Distal25                   monophasic         +----------+--------+-----+--------+----------+--------+ PTA Distal24                   monophasic         +----------+--------+-----+--------+----------+--------+ PERO Mid  31                   monophasic         +----------+--------+-----+--------+----------+--------+  +----------+--------+-----+--------+----------+--------------+ LEFT      PSV cm/sRatioStenosisWaveform  Comments       +----------+--------+-----+--------+----------+--------------+ DFA                                      Not Visualized +----------+--------+-----+--------+----------+--------------+ SFA Prox  35                   monophasic               +----------+--------+-----+--------+----------+--------------+ SFA Mid   20                   monophasic               +----------+--------+-----+--------+----------+--------------+ SFA Distal20                   monophasic               +----------+--------+-----+--------+----------+--------------+ POP Prox  13                   monophasic               +----------+--------+-----+--------+----------+--------------+ POP Distal14                   monophasic               +----------+--------+-----+--------+----------+--------------+ ATA Distal11                   monophasic               +----------+--------+-----+--------+----------+--------------+ PTA Distal6                    monophasic               +----------+--------+-----+--------+----------+--------------+  Summary: See table(s) above for measurements and observations. Electronically signed by Coral Else MD on 07/11/2023 at 8:44:22 PM.    Final    VAS [place] LOWER EXTREMITY VENOUS (DVT) Result Date: 07/11/2023  Lower  Venous DVT Study Patient Name:  Aaron [name]  Date of Exam:  07/11/2023 Medical Rec #: [id number]     Accession #:    [id number] Date of Birth: 1985-08-31     Patient Gender: M Patient Age:   88 years Exam Location:  [facility] Procedure:      VAS [place] LOWER EXTREMITY VENOUS (DVT) Referring Phys: Clearnce Hasten --------------------------------------------------------------------------------  Indications: Pulmonary embolism.  Risk Factors: Confirmed PE Surgery ECMO cannulation 07/10/23. Limitations: Patient positioning & ECMO. Comparison Study: None. Performing Technologist: Shona Simpson  Examination Guidelines: A complete evaluation includes B-mode imaging, spectral Doppler, color Doppler, and power Doppler as needed of all accessible portions of each vessel. Bilateral testing is considered an integral part of a complete examination. Limited examinations for reoccurring indications may be performed as noted. The reflux portion of the exam is performed with the patient in reverse Trendelenburg.  +---------+---------------+---------+-----------+----------+--------------+ RIGHT    CompressibilityPhasicitySpontaneityPropertiesThrombus Aging +---------+---------------+---------+-----------+----------+--------------+ CFV                                                   Not Visualized +---------+---------------+---------+-----------+----------+--------------+ SFJ                                                   Not Visualized +---------+---------------+---------+-----------+----------+--------------+ FV Prox  Full                                                        +---------+---------------+---------+-----------+----------+--------------+ FV Mid   Full                                                        +---------+---------------+---------+-----------+----------+--------------+ FV DistalFull                    Yes                                  +---------+---------------+---------+-----------+----------+--------------+ PFV      Full                                                        +---------+---------------+---------+-----------+----------+--------------+ POP      Full           Yes      Yes                                 +---------+---------------+---------+-----------+----------+--------------+ PTV      Full                                                        +---------+---------------+---------+-----------+----------+--------------+  PERO     Full                                                        +---------+---------------+---------+-----------+----------+--------------+   +---------+---------------+---------+-----------+----------+--------------+ LEFT     CompressibilityPhasicitySpontaneityPropertiesThrombus Aging +---------+---------------+---------+-----------+----------+--------------+ CFV                                                   Not Visualized +---------+---------------+---------+-----------+----------+--------------+ SFJ                                                   Not Visualized +---------+---------------+---------+-----------+----------+--------------+ FV Prox  Full                                                        +---------+---------------+---------+-----------+----------+--------------+ FV Mid   Full                                                        +---------+---------------+---------+-----------+----------+--------------+ FV DistalFull                    Yes                                 +---------+---------------+---------+-----------+----------+--------------+ PFV      Full                                                        +---------+---------------+---------+-----------+----------+--------------+ POP      Full           Yes      Yes                                  +---------+---------------+---------+-----------+----------+--------------+ PTV      Full                                                        +---------+---------------+---------+-----------+----------+--------------+ PERO                                                  Not Visualized +---------+---------------+---------+-----------+----------+--------------+     Summary: BILATERAL: -  No evidence of deep vein thrombosis seen in the lower extremities, bilaterally. -No evidence of popliteal cyst, bilaterally.   *See table(s) above for measurements and observations. Electronically signed by Coral Else MD on 07/11/2023 at 8:40:51 PM.    Final    DG CHEST PORT 1 VIEW Result Date: 07/11/2023 CLINICAL DATA:  ECMO. EXAM: PORTABLE CHEST 1 VIEW COMPARISON:  Chest radiograph dated 07/11/2023. FINDINGS: Support line and tube in similar position. Interval lumen of an area of airspace opacity in the right infrahilar region, likely atelectasis. Pneumonia is not excluded. No large pleural effusion. No pneumothorax. Stable cardiac silhouette no acute osseous pathology. IMPRESSION: 1. Support line and tube in similar position. 2. Right infrahilar atelectasis. Electronically Signed   By: Elgie Collard M.D.   On: 07/11/2023 17:53   DG Chest Port 1 View in am Result Date: 07/11/2023 CLINICAL DATA:  [id number] On mechanically assisted ventilation [facility]) [id number] EXAM: PORTABLE CHEST 1 VIEW COMPARISON:  07/10/2023 FINDINGS: Endotracheal tube, enteric tube, left IJ central line, right IJ pulmonary arterial catheter, and Impella device all remain in stable positioning compared to the previous exam. Stable cardiomegaly. Persistent retrocardiac opacity. Mild right basilar atelectasis. Hazy opacity in the left upper lobe. No pleural effusion or pneumothorax. IMPRESSION: 1. Stable support apparatus. 2. Similar bilateral airspace opacities including retrocardiac opacity and hazy opacity in the left upper lobe.  Electronically Signed   By: Duanne Guess D.O.   On: 07/11/2023 10:44   EEG adult Result Date: 07/11/2023 Charlsie Quest, MD     07/11/2023 10:25 AM Patient Name: Aaron [name] MRN: [id number] Epilepsy Attending: Charlsie Quest Referring Physician/Provider: Romie Minus, MD Date: 07/11/2023 Duration: 21.38 mins Patient history: 38yo M s/p cardia arrest. EEG to evaluate for seizure Level of alertness: comatose/ lethargic AEDs during EEG study: Versed Technical aspects: This EEG study was done with scalp electrodes positioned according to the 10-20 International system of electrode placement. Electrical activity was reviewed with band pass filter of 1-70Hz , sensitivity of 7 uV/mm, display speed of 53mm/sec with a 60Hz  notched filter applied as appropriate. EEG data were recorded continuously and digitally stored.  Video monitoring was available and reviewed as appropriate. Description: EEG showed continuous generalized 3 to 6 Hz theta-delta slowing admixed with 15 to 18 Hz beta activity distributed symmetrically and diffusely. Hyperventilation and photic stimulation were not performed.   ABNORMALITY - Continuous slow, generalized IMPRESSION: This study is suggestive of moderate diffuse encephalopathy likely related to sedation. No seizures or epileptiform discharges were seen throughout the recording. Priyanka Annabelle Harman   CT CHEST ABDOMEN PELVIS WO CONTRAST Result Date: 07/11/2023 CLINICAL DATA:  Patient on ECMO. Seizure. New onset. No history of trauma. Rule out bleeding after TNK. EXAM: CT CHEST, ABDOMEN AND PELVIS WITHOUT CONTRAST TECHNIQUE: Multidetector CT imaging of the chest, abdomen and pelvis was performed following the standard protocol without IV contrast. RADIATION DOSE REDUCTION: This exam was performed according to the departmental dose-optimization program which includes automated exposure control, adjustment of the mA and/or kV according to patient size and/or use of iterative  reconstruction technique. COMPARISON:  CT 10/10/2008 and CTA chest 07/09/2023 FINDINGS: CT CHEST FINDINGS Cardiovascular: Cardiomegaly. Small pericardial effusion. Left IJ CVC tip in the mid SVC. Right IJ Swan-Ganz catheter tip in the distal right pulmonary artery. ECMO cannula in the left ventricle and IVC. Mediastinum/Nodes: Endotracheal tube tip in the intrathoracic trachea. Enteric tube tip in the stomach. Hilar and mediastinal adenopathy was better evaluated on CT chest with contrast 07/09/2023.  Lungs/Pleura: Near-complete consolidation within the left lower lobe with volume loss. The left lower lobe bronchus is occluded with debris. Consolidation and ground-glass opacities in the right lower lobe. Patchy bilateral ground-glass opacities greatest in the left upper lobe. These findings are significantly increased compared to 07/09/2023. No pleural effusion or pneumothorax. Musculoskeletal: No acute fracture. CT ABDOMEN PELVIS FINDINGS Hepatobiliary: Unremarkable noncontrast appearance of the liver, gallbladder, and biliary tree. Pancreas: Mild haziness in the peripancreatic fat. No ductal dilation. No organized fluid collection. Spleen: Unremarkable. Adrenals/Urinary Tract: Normal adrenal glands. Nonspecific bilateral perinephric fluid/stranding, slightly greater on the right. No urinary calculi or hydronephrosis. Foley catheter in the nondistended bladder. Gas in the anti dependent bladder. Stomach/Bowel: Wall thickening about the duodenum with Peri duodenal fluid and stranding. Decompressed transverse, descending, and sigmoid colon. No bowel obstruction. Normal appendix. Vascular/Lymphatic: Lines and tubes are described above. No lymphadenopathy. Reproductive: No acute abnormality. Other: Small volume free fluid in the pelvis and about the liver and spleen. No organized fluid collection or abscess. No free intraperitoneal air. Musculoskeletal: No acute fracture. IMPRESSION: 1. Significant increase in patchy  ground-glass and consolidative opacities compared with 07/09/2023. This is nonspecific but given clinical concern this could represent alveolar hemorrhage. Differential considerations include aspiration and multifocal pneumonia. 2. Postobstructive atelectasis/pneumonia secondary to occlusion of the left lower lobe bronchus. 3. Wall thickening of the duodenum. Stranding and fluid about the duodenum and pancreas. It is unclear if this represents duodenitis or pancreatitis with reactive duodenitis. Correlate with lipase. 4. Nonspecific bilateral perinephric fluid/stranding, slightly greater on the right. Correlate with urinalysis to exclude infection. 5. Small volume free fluid in the abdomen and pelvis. No organized fluid collection or abscess. 6. Lines and tubes as described. Electronically Signed   By: Minerva Fester M.D.   On: 07/11/2023 02:14   CT HEAD WO CONTRAST ( ) Result Date: 07/11/2023 CLINICAL DATA:  , New onset seizure. No history of trauma. Rule out bleeding. EXAM: CT HEAD WITHOUT CONTRAST TECHNIQUE: Contiguous axial images were obtained from the base of the skull through the vertex without intravenous contrast. RADIATION DOSE REDUCTION: This exam was performed according to the departmental dose-optimization program which includes automated exposure control, adjustment of the mA and/or kV according to patient size and/or use of iterative reconstruction technique. COMPARISON:  CT head 08/16/2016 FINDINGS: Brain: No intracranial hemorrhage, mass effect, or evidence of acute infarct. No hydrocephalus. No extra-axial fluid collection. Vascular: No hyperdense vessel or unexpected calcification. Skull: No fracture or focal lesion. Sinuses/Orbits: Mucosal thickening in the paranasal sinuses. No mastoid effusion. Other: Partially visualized enteric and endotracheal tubes. IMPRESSION: No acute intracranial abnormality. Electronically Signed   By: Minerva Fester M.D.   On: 07/11/2023 01:54   DG CHEST PORT  1 VIEW Result Date: 07/10/2023 CLINICAL DATA:  Patient receiving ECMO. EXAM: PORTABLE CHEST 1 VIEW COMPARISON:  07/10/2023 FINDINGS: Endotracheal tube with tip measuring 5.6 cm above the carina. Left central venous catheter, right Swan-Ganz catheter, and cardiac balloon pump are unchanged in position. Cardiac enlargement. Small left pleural effusion. Basilar atelectasis. Right lung is clear. IMPRESSION: Appliances remain unchanged in position. Small left pleural effusion with basilar atelectasis. Electronically Signed   By: Burman Nieves M.D.   On: 07/10/2023 22:21   DG Chest Port 1 View Result Date: 07/10/2023 CLINICAL DATA:  Mechanically assisted ventilation EXAM: PORTABLE CHEST 1 VIEW COMPARISON:  07/09/2023 FINDINGS: An endotracheal tube has been placed with tip measuring 5.9 cm above the carina. Left central venous catheter with tip projecting over the cavoatrial  junction region. Right Swan-Ganz catheter with tip projecting over the right pulmonary artery. Cardiac balloon catheter tip projecting over the left ventricle. Cardiac enlargement. Probable small left pleural effusion. Lungs are grossly clear. No pneumothorax. IMPRESSION: Appliances appear in satisfactory position. Cardiac enlargement. Lungs are clear. Probable small left pleural effusion. Electronically Signed   By: Burman Nieves M.D.   On: 07/10/2023 19:36   ECHO TEE Result Date: 07/10/2023    TRANSESOPHOGEAL ECHO REPORT   Patient Name:   Aaron [name] Date of Exam: 07/10/2023 Medical Rec #:  [id number]    Height:       76.0 in Accession #:    [id number]   Weight:       215.0 lb Date of Birth:  [birth year]    BSA:          2.284 m Patient Age:    37 years     BP:           0/0 mmHg Patient Gender: M            HR:           71 bpm. Exam Location:  Inpatient Procedure: Transesophageal Echo, Cardiac Doppler, Color Doppler and 3D Echo            (Both Spectral and Color Flow Doppler were utilized during            procedure). Indications:      Impella placement and ECMO  History:         Patient has prior history of Echocardiogram examinations, most                  recent 03/20/2023. Arrythmias:Cardiac Arrest;                  Signs/Symptoms:Chest Pain and Shortness of Breath.  Sonographer:     Delcie Roch RDCS Referring Phys:  [id number] Floreen Comber PATEL Diagnosing Phys: Arvilla Meres MD PROCEDURE: After discussion of the risks and benefits of a TEE, an informed consent was obtained from the patient. The patient was intubated. The transesophogeal probe was passed without difficulty through the esophogus of the patient. Imaged were obtained with the patient in a supine position. Sedation performed by different physician. The patient was monitored while under deep sedation. The patient developed no complications during the procedure.  IMPRESSIONS  1. Post ECMO images with significant decrease in biventricular function compared to pre-ECMO TTE. Left ventricular ejection fraction, by estimation, is <20%. The left ventricle has severely decreased function. The left ventricle demonstrates global hypokinesis. The left ventricular internal cavity size was moderately dilated.  2. Right ventricular systolic function is severely reduced. The right ventricular size is normal.  3. Left atrial size was moderately dilated. No left atrial/left atrial appendage thrombus was detected.  4. Right atrial size was moderately dilated.  5. The mitral valve is abnormal. Mild mitral valve regurgitation. There is mild prolapse of both leaflets of the mitral valve.  6. + Impella catheter. The aortic valve is tricuspid. Aortic valve regurgitation is not visualized. No aortic stenosis is present.  7. 3D performed of the mitral valve and demonstrates see above. Conclusion(s)/Recommendation(s): Pre-ECMO TTE images attached at the beginning of this study show LVEF 35-40% with flail posterior MV leaflet and severe MR Post-ECMO images with results aa above. FINDINGS  Left Ventricle:  Post ECMO images with significant decrease in biventricular function compared to pre-ECMO TTE. Left ventricular ejection fraction, by estimation, is <20%. The left ventricle  has severely decreased function. The left ventricle demonstrates global hypokinesis. The left ventricular internal cavity size was moderately dilated. Right Ventricle: The right ventricular size is normal. No increase in right ventricular wall thickness. Right ventricular systolic function is severely reduced. Left Atrium: Left atrial size was moderately dilated. No left atrial/left atrial appendage thrombus was detected. Right Atrium: Right atrial size was moderately dilated. Pericardium: There is no evidence of pericardial effusion. Mitral Valve: The mitral valve is abnormal. There is mild prolapse of both leaflets of the mitral valve. Mild mitral valve regurgitation. Tricuspid Valve: The tricuspid valve is normal in structure. Tricuspid valve regurgitation is mild. Aortic Valve: + Impella catheter. The aortic valve is tricuspid. Aortic valve regurgitation is not visualized. No aortic stenosis is present. Pulmonic Valve: The pulmonic valve was grossly normal. Pulmonic valve regurgitation is not visualized. Aorta: The aortic root is normal in size and structure. IAS/Shunts: No atrial level shunt detected by color flow Doppler. Additional Comments: 3D was performed not requiring image post processing on an independent workstation and was abnormal. AORTIC VALVE LVOT Vmax:   60.80 cm/s LVOT Vmean:  37.400 cm/s LVOT VTI:    0.105 m  SHUNTS Systemic VTI: 0.10 m Arvilla Meres MD Electronically signed by Arvilla Meres MD Signature Date/Time: 07/10/2023/5:32:16 PM    Final    CARDIAC CATHETERIZATION Addendum Date: 07/10/2023 Successful VA ECMO cannulation and Impella CP placement as a vent Arvilla Meres, MD 5:09 PM   Result Date: 07/10/2023 Successful VA ECMO cannulation and Impella CP placement as a vent Arvilla Meres, MD 5:09  PM   Labs: BMET Recent Labs  Lab 07/10/23 1224 07/10/23 1310 07/10/23 1425 07/10/23 1428 07/10/23 1708 07/10/23 1709 07/10/23 2302 07/11/23 0026 07/11/23 1610 07/11/23 9604 07/11/23 5409 07/11/23 1600 07/11/23 1654 07/12/23 0216 07/12/23 0217 07/12/23 0427 07/12/23 0440 07/12/23 0448 07/12/23 0602 07/12/23 0606 07/12/23 0758 07/12/23 0820  NA 146*   < > 136   < > 140   < > 140   < > 140 141   < > 140   < > 138 140 138 136 140 138 141 138 140  K 7.2*   < > 6.2*   < > 4.4   < > 5.7*   < > 7.2* 7.0*   < > 6.2*   < > 4.7 3.7 4.6 4.4 3.6 4.3 3.4* 4.0 3.3*  CL 108   < > 101   < > 104  --  107  --  106 107  --  104   < > 99 94* 99  --  93* 97* 92*  --  92*  CO2 22  --  17*  --  18*  --  23  --  22 22  --  26  --   --   --  25  --   --   --   --   --   --   GLUCOSE 49*   < > 265*   < > 56*  --  152*  --  94 82  --  132*   < > 181* 217* 197*  --  225* 192* 227*  --  228*  BUN 21*   < > 20   < > 21*  --  30*  --  39* 41*  --  46*   < > 47* 37* 38*  --  36* 43* 34*  --  34*  CREATININE 1.53*   < > 1.88*   < > 1.70*  --  2.09*  --  2.76* 3.00*  --  3.48*   < > 3.50* 2.30* 3.03*  --  2.10* 3.50* 2.10*  --  2.10*  CALCIUM 14.8*  --  11.4*  --  9.9  --  8.3*  --  7.9* 7.8*  --  8.5*  --   --   --  9.2  --   --   --   --   --   --   PHOS 8.3*  --   --   --   --   --   --   --   --   --   --  5.3*  --   --   --  5.6*  --   --   --   --   --   --    < > = values in this interval not displayed.   CBC Recent Labs  Lab 07/11/23 1700 07/11/23 1853 07/11/23 2340 07/12/23 0022 07/12/23 0427 07/12/23 0440 07/12/23 0606 07/12/23 0657 07/12/23 0758 07/12/23 0820  WBC 41.3*  --  32.3*  --  28.7*  --   --  27.3*  --   --   HGB 7.9*   < > 7.8*   < > 8.0*   < > 8.8* 7.8* 8.5* 9.9*  HCT 25.9*   < > 24.1*   < > 23.9*   < > 26.0* 23.6* 25.0* 29.0*  MCV 73.8*  --  72.4*  --  73.3*  --   --  73.5*  --   --   PLT 152  --  118*  --  96*  --   --  94*  --   --    < > = values in this interval not  displayed.    Medications:     sodium chloride   Intravenous Once   sodium chloride   Intravenous Once   sodium chloride   Intravenous Once   sodium chloride   Intravenous Once   sodium chloride   Intravenous Once   sodium chloride   Intravenous Once   Chlorhexidine Gluconate Cloth  6 each Topical Daily   docusate  100 mg Per Tube BID   fentaNYL (SUBLIMAZE) injection  50 mcg Intravenous Once   folic acid  1 mg Per Tube Daily   hydrocortisone sod succinate (SOLU-CORTEF) inj  100 mg Intravenous Q8H   insulin aspart  0-15 Units Subcutaneous Q4H   lactulose  20 g Per Tube TID   methimazole  15 mg Per Tube Q8H   metoCLOPramide (REGLAN) injection  10 mg Intravenous Q8H   multivitamin  1 tablet Per Tube QHS   mouth rinse  15 mL Mouth Rinse Q2H   pantoprazole (PROTONIX) IV  40 mg Intravenous QHS   polyethylene glycol  17 g Per Tube Daily   sodium bicarbonate  50 mEq Intravenous Once   sodium chloride flush  3 mL Intravenous Q12H   sorbitol  30 mL Per Tube Once   thiamine  100 mg Per Tube Daily   Or   thiamine  100 mg Intravenous Daily      [name], MD [facility] 07/12/2023, 8:34 AM

## 2023-07-12 NOTE — Progress Notes (Signed)
 NAME:  Aaron [name], MRN:  [id number], DOB:  Dec 01, 1985, LOS: 3 ADMISSION DATE:  07/09/2023, CONSULTATION DATE:  07/11/2023 REFERRING MD: Clearnce Hasten, CHIEF COMPLAINT: Status post cardiac arrest  History of Present Illness:  38 year old male with hypothyroidism, paroxysmal A-fib, severe mitral regurgitation who presented to [facility] with chest pain and shortness of breath for few days associated cough, nausea and diarrhea.  In the emergency department he was diagnosed with PE, was started on IV heparin.  He takes beta-blocker, steroid and methimazole for hyperthyroidism.  On 2/27 patient rapidly deteriorated developed V. tach/V-fib cardiac arrest patient was intubated after 40 minutes of CPR, cardiology was consulted patient was placed on [place] ECMO and was transferred to [facility]  Pertinent  Medical History   Past Medical History:  Diagnosis Date   Atrial fibrillation [facility])    Hyperthyroidism    Mitral regurgitation    NSTEMI (non-ST elevated myocardial infarction) (HCC)      Significant Hospital Events: Including procedures, antibiotic start and stop dates in addition to other pertinent events     Interim History / Subjective:  Became tachycardic overnight, heart rate ranging in 130s-140s On [place] ECMO with 4.2 L flow, also on Impella at P2 with 2.4 L flow Was started on CRRT yesterday, with goal net even  Afebrile Came off of norepinephrine, remains on vasopressin  Objective   Blood pressure (!) 86/73, pulse (!) 132, temperature 97.9 F (36.6 C), resp. rate 12, height 6\' 4"  (1.93 m), weight 88.7 kg, SpO2 100%. PAP: (27-50)/(15-32) 28/21 CVP:  [4 mmHg-13 mmHg] 9 mmHg CO:  [2.5 L/min-3.3 L/min] 2.7 L/min CI:  [1.1 L/min/m2-1.4 L/min/m2] 1.2 L/min/m2  Vent Mode: PRVC FiO2 (%):  [50 %] 50 % Set Rate:  [12 bmp] 12 bmp Vt Set:  [690 mL] 690 mL PEEP:  [5 cmH20] 5 cmH20 Plateau Pressure:  [24 cmH20] 24 cmH20   Intake/Output Summary (Last 24 hours) at  07/12/2023 0827 Last data filed at 07/12/2023 0800 Gross per 24 hour  Intake 3189.48 ml  Output 3228.7 ml  Net -39.22 ml   Filed Weights   07/09/23 1838 07/11/23 0600 07/12/23 0500  Weight: 97.5 kg 95.4 kg 88.7 kg    Examination: General: Crtitically ill-appearing male, orally intubated HEENT: [place]/AT, eyes anicteric.  ETT and OGT in place Neuro: Sedated, not following commands.  Eyes are closed.  Pupils 3 mm bilateral reactive to light Chest: Clear to auscultation bilaterally,, no wheezes or rhonchi Heart: Tachycardic, regular rhythm, pansystolic murmur best heard in mitral area Abdomen: Soft, nondistended, bowel sounds present Skin: No rash  Labs and images reviewed  Resolved Hospital Problem list   Lactic acidosis, resolved  Assessment & Plan:  Status post in-hospital V. tach followed by PEA cardiac arrest Paroxysmal A-fib RVR Severe mitral regurgitation Acute biventricular HFrEF with cardiogenic shock on VA ECMO Acute pulmonary emboli status post TNK Acute respiratory failure with hypoxia Hyperthyroidism Acute kidney injury due to ischemic ATN on CRRT Refractory hyperkalemia, improving Hyperphosphatemia/hypocalcemia Shock liver Acute on chronic anemia due to critical illness  VA ECMO flow was decreased to 4.2 On Impella at P2 with 2.4 L flow, continue bicarb purge with Impella Continue telemetry monitoring, remain in sinus tachycardia Closely monitor and supplement electrolytes Advanced heart failure team is following, patient will eventually need mitral valve surgery once clinically stable Started on CRRT, with goal net even Nephrology is following Serum potassium has corrected with CRRT, currently at 3.3 Monitor PTT, continue bival Status cleared Continue  lung protective ventilation VAP prevention bundle in place Decrease respiratory rate to 10 on ventilator PAD protocol with Precedex infusion EEG was negative for acute seizures, showing diffuse  encephalopathy Started improving, closely monitor Continue methimazole and stress dose steroid Holding beta-blocker considering cardiogenic shock Monitor H&H and transfuse if less than 7 Antibiotics were broadened to vancomycin and meropenem considering high white count which could be due to steroid therapy Received 2 units PRBCs yesterday, continue to monitor CBC and transfuse if hemoglobin less than 8 Closely monitor electrolytes  Best Practice (right click and "Reselect all SmartList Selections" daily)   Diet/type: NPO start trickle tube feeds DVT prophylaxis systemic bival Pressure ulcer(s): N/A GI prophylaxis: PPI Lines: Central line, Arterial Line, and yes and it is still needed.  ECMO cannula in place Foley:  Yes, and it is still needed Code Status:  full code Last date of multidisciplinary goals of care discussion [Per primary team]  Labs   CBC: Recent Labs  Lab 07/11/23 0404 07/11/23 0407 07/11/23 1700 07/11/23 1853 07/11/23 2340 07/12/23 0022 07/12/23 0427 07/12/23 0440 07/12/23 0448 07/12/23 0602 07/12/23 0606 07/12/23 0657  WBC 29.7*  --  41.3*  --  32.3*  --  28.7*  --   --   --   --  27.3*  HGB 9.0*   < > 7.9*   < > 7.8*   < > 8.0* 8.5* 9.2* 7.5* 8.8* 7.8*  HCT 28.0*   < > 25.9*   < > 24.1*   < > 23.9* 25.0* 27.0* 22.0* 26.0* 23.6*  MCV 70.4*  --  73.8*  --  72.4*  --  73.3*  --   --   --   --  73.5*  PLT 185  --  152  --  118*  --  96*  --   --   --   --  94*   < > = values in this interval not displayed.    Basic Metabolic Panel: Recent Labs  Lab 07/10/23 1224 07/10/23 1310 07/10/23 2302 07/11/23 0026 07/11/23 1610 07/11/23 0737 07/11/23 0742 07/11/23 1600 07/11/23 1654 07/12/23 0217 07/12/23 0427 07/12/23 0440 07/12/23 0448 07/12/23 0602 07/12/23 0606  NA 146*   < > 140   < > 140 141   < > 140   < > 140 138 136 140 138 141  K 7.2*   < > 5.7*   < > 7.2* 7.0*   < > 6.2*   < > 3.7 4.6 4.4 3.6 4.3 3.4*  CL 108   < > 107  --  106 107  --   104   < > 94* 99  --  93* 97* 92*  CO2 22   < > 23  --  22 22  --  26  --   --  25  --   --   --   --   GLUCOSE 49*   < > 152*  --  94 82  --  132*   < > 217* 197*  --  225* 192* 227*  BUN 21*   < > 30*  --  39* 41*  --  46*   < > 37* 38*  --  36* 43* 34*  CREATININE 1.53*   < > 2.09*  --  2.76* 3.00*  --  3.48*   < > 2.30* 3.03*  --  2.10* 3.50* 2.10*  CALCIUM 14.8*   < > 8.3*  --  7.9* 7.8*  --  8.5*  --   --  9.2  --   --   --   --   MG 3.3*  --  1.9  --  1.9 2.1  --   --   --   --  1.7  --   --   --   --   PHOS 8.3*  --   --   --   --   --   --  5.3*  --   --  5.6*  --   --   --   --    < > = values in this interval not displayed.   GFR: Estimated Creatinine Clearance: 58.6 mL/min (A) (by C-G formula based on SCr of 2.1 mg/dL (H)). Recent Labs  Lab 07/11/23 1646 07/11/23 1700 07/11/23 2114 07/11/23 2340 07/12/23 0031 07/12/23 0427 07/12/23 0445 07/12/23 0657  WBC  --  41.3*  --  32.3*  --  28.7*  --  27.3*  LATICACIDVEN 3.1*  --  2.7*  --  3.1*  --  2.4*  --     Liver Function Tests: Recent Labs  Lab 07/10/23 1425 07/10/23 1708 07/11/23 0512 07/11/23 0737 07/11/23 1600 07/12/23 0427  AST 212* 575* 1,848* 1,894*  --  1,324*  ALT 121* 285* 585* 606*  --  472*  ALKPHOS 70 73 78 78  --  71  BILITOT 2.6* 3.0* 6.3* 7.0*  --  8.6*  PROT 5.2* 5.2* 5.3* 5.2*  --  4.6*  ALBUMIN 2.5* 2.6* 2.5* 2.5* 2.4* 2.1*   Recent Labs  Lab 07/09/23 1934  LIPASE 23   No results for input(s): "AMMONIA" in the last 168 hours.  ABG    Component Value Date/Time   PHART 7.529 (H) 07/12/2023 0440   PCO2ART 36.7 07/12/2023 0440   PO2ART 156 (H) 07/12/2023 0440   HCO3 30.7 (H) 07/12/2023 0440   TCO2 32 07/12/2023 0606   ACIDBASEDEF 1.0 07/11/2023 1314   O2SAT 100 07/12/2023 0440     Coagulation Profile: Recent Labs  Lab 07/10/23 1224 07/10/23 1708 07/10/23 2015 07/11/23 0404 07/12/23 0427  INR 3.1* 4.4* 3.6* 4.8* 2.6*    Cardiac Enzymes: Recent Labs  Lab 07/11/23 0512  07/11/23 1854  CKTOTAL 117 357    HbA1C: Hgb A1c MFr Bld  Date/Time Value Ref Range Status  07/10/2023 06:44 AM 4.8 4.8 - 5.6 % Final    Comment:    (NOTE) Pre diabetes:          5.7%-6.4%  Diabetes:              >6.4%  Glycemic control for   <7.0% adults with diabetes   10/20/2021 01:07 AM 4.2 (L) 4.8 - 5.6 % Final    Comment:    (NOTE) Pre diabetes:          5.7%-6.4%  Diabetes:              >6.4%  Glycemic control for   <7.0% adults with diabetes     CBG: Recent Labs  Lab 07/11/23 1641 07/11/23 2027 07/12/23 0021 07/12/23 0437 07/12/23 0755  GLUCAP 136* 156* 173* 176* 182*    Critical care time:     The patient is critically ill due to cardiogenic shock status post VA ECMO.  Critical care was necessary to treat or prevent imminent or life-threatening deterioration.  Critical care was time spent personally by me on the following activities: development of treatment plan with patient and/or surrogate as well as nursing,  discussions with consultants, evaluation of patient's response to treatment, examination of patient, obtaining history from patient or surrogate, ordering and performing treatments and interventions, ordering and review of laboratory studies, ordering and review of radiographic studies, pulse oximetry, re-evaluation of patient's condition and participation in multidisciplinary rounds.   During this encounter critical care time was devoted to patient care services described in this note for 45 minutes.     Cheri Fowler, MD [facility] See Amion for pager If no response to pager, please call [phone] until 7pm After 7pm, Please call E-link [phone]

## 2023-07-12 NOTE — Progress Notes (Signed)
 PHARMACY - ANTICOAGULATION CONSULT NOTE  Pharmacy Consult for bivalirudin Indication:  ECMO, Impella  No Known Allergies  Patient Measurements: Height: 6\' 4"  (193 cm) Weight: 88.7 kg (195 lb 8.8 oz) IBW/kg (Calculated) : 86.8 Heparin Dosing Weight: n/a  Vital Signs: Temp: 97.9 F (36.6 C) (03/01 1800) Temp Source: Core (03/01 1600) Pulse Rate: 79 (03/01 1800)  Labs: Recent Labs    07/09/23 2047 07/10/23 0644 07/10/23 1224 07/10/23 1310 07/10/23 1425 07/10/23 1428 07/10/23 1708 07/10/23 1709 07/10/23 2015 07/10/23 2034 07/10/23 2238 07/10/23 2302 07/11/23 0404 07/11/23 0407 07/11/23 0512 07/11/23 0540 07/11/23 1854 07/11/23 2024 07/12/23 0427 07/12/23 0440 07/12/23 0555 07/12/23 0602 07/12/23 0657 07/12/23 0758 07/12/23 1155 07/12/23 1158 07/12/23 1359 07/12/23 1600 07/12/23 1604 07/12/23 1611  HGB  --    < > 10.6*   < > 10.1*   < > 10.0*   < > 9.6*   < >  --    < > 9.0*   < >  --    < >  --    < > 8.0*   < >  --    < > 7.8*   < >  --    < > 9.5* 7.8* 8.8* 9.2*  HCT  --    < > 37.0*   < > 33.7*   < > 31.3*   < > 30.1*   < >  --    < > 28.0*   < >  --    < >  --    < > 23.9*   < >  --    < > 23.6*   < >  --    < > 28.0* 23.0* 26.0* 27.0*  PLT  --    < > 269  --  199  --  199  --  210  --   --   --  185  --   --    < >  --    < > 96*  --   --   --  94*  --   --   --   --  91*  --   --   APTT  --   --   --   --  134*   < >  --   --  76*  --  50*  --   --   --   --    < >  --    < >  --   --  62*  --   --   --  62*  --   --  61*  --   --   LABPROT  --   --  31.8*  --   --   --  42.0*  --  36.0*  --   --   --  45.2*  --   --   --   --   --  28.1*  --   --   --   --   --   --   --   --   --   --   --   INR  --   --  3.1*  --   --   --  4.4*  --  3.6*  --   --   --  4.8*  --   --   --   --   --  2.6*  --   --   --   --   --   --   --   --   --   --   --  HEPARINUNFRC  --    < > <0.10*  --   --   --  0.23*  --   --   --  <0.10*  --   --   --   --   --   --   --   --    --   --   --   --   --   --   --   --   --   --   --   CREATININE  --    < > 1.53*   < > 1.88*   < > 1.70*  --   --   --   --    < >  --   --  2.76*   < >  --    < > 3.03*   < >  --    < >  --    < >  --    < > 2.30* 3.01*  --  2.30*  CKTOTAL  --   --   --   --   --   --   --   --   --   --   --   --   --   --  117  --  357  --   --   --   --   --   --   --   --   --   --   --   --   --   TROPONINIHS 56*  --  263*  --  597*  --   --   --   --   --   --   --   --   --   --   --   --   --   --   --   --   --   --   --   --   --   --   --   --   --    < > = values in this interval not displayed.    Estimated Creatinine Clearance: 53.5 mL/min (A) (by C-G formula based on SCr of 2.3 mg/dL (H)).   Medical History: Past Medical History:  Diagnosis Date   Atrial fibrillation (HCC)    Hyperthyroidism    Mitral regurgitation    NSTEMI (non-ST elevated myocardial infarction) [facility])     Assessment: 38 yo M with bilateral PE s/p TNK (2/27 @1156 ) now on [place] ECMO + Impella. Pharmacy consulted for bivalirudin for anticoagulation.   aPTT remains therapeutic at 61, on bivalirudin 0.03 mg/kg/hr. Hgb 7.8, Plt 94, LDH >2500, fibrinogen 277. Minor bleeding in ET tube when suctioned. Old drainage on both cannulation sites - now improved. Hematuria stable, not worsening. Remains on CRRT - requiring citrate. No fibrin in ECMO circuit.   Goal of Therapy:  aPTT 50-70 seconds Monitor platelets by anticoagulation protocol: Yes   Plan:  Continue bivalirudin 0.03mg /kg/hr (using dose weight 97.5 kg) aPTT q12h F/u CBC, LDH, fibrinogen Monitor s/s bleeding   Thank you for allowing pharmacy to participate in this patient's care,  Jenetta Downer, [facility] Clinical Pharmacist  07/12/2023 6:44 PM   [facility] pharmacy phone numbers are listed on [email or web address]

## 2023-07-12 NOTE — Progress Notes (Signed)
 Pt noted to suddenly wake up extremely agitated requiring RN x5 to keep in the bed for patient safety. Dr. Elwyn Lade and Dr. Merrily Pew called to bedside. Verbal order to up titrate fentanyl infusion from to , up titrate precedex from 0.4mg  to 1.2mg , and 100mg  Roc push for pt safety. PRN dose of 4mg  versed also given. Pt now calm and resting without potential harm to self/medical devices.

## 2023-07-12 NOTE — CV Procedure (Signed)
 ECMO NOTE:   Indication: Cardiogenic shock   Initial cannulation date: 07/10/23   ECMO type: VA ECMO with Impella CP vent   Dual lumen inflow/return cannula:   1) 25 FR multi-stage venous drainage RFV 2) 21 FR arterial return LCFA 3) 6FR L SFA distal perfusion catheter 4) Impella CP via R CFA LV vent    Daily data:   Flow 4.22 L RPM 3195 Sweep  2L Blender at 100   Impella at P2 2.8L flow   Labs:   ABG    Component Value Date/Time   PHART 7.533 (H) 07/12/2023 1004   PCO2ART 39.0 07/12/2023 1004   PO2ART 82 (L) 07/12/2023 1004   HCO3 32.9 (H) 07/12/2023 1004   TCO2 34 (H) 07/12/2023 1006   ACIDBASEDEF 1.0 07/11/2023 1314   O2SAT 97 07/12/2023 1004    Hgb 7.8 Platelets 94 LDH >2500 PTT 62, discussed dosing with pharmacy Lactic acid 2.0   Plan:  Stabilize with EC-pella support Requiring low dose vasopressin Discussed case with CT surgery. Given hyperkalemia/ARF/coagulopathy will hold on 5.5 placement, likely Monday Turned down ECMO flow given worsening hemolysis markers with improvement in pulse pressure, impella motor current. Goal 4.2L ECMO flow, can adjust O2 on vent as required for north/south   Romie Minus, MD  12:06 PM

## 2023-07-12 NOTE — Consult Note (Signed)
 Palliative Care Consult Note                                  Date: 07/12/2023   Patient Name: Aaron [name]  DOB: Sep 23, 1985  MRN: [id number]  Age / Sex: 38 y.o., male  PCP: Patient, No Pcp Per Referring Physician: Romie Minus, MD  Reason for Consultation: ECMO patient  HPI/Patient Profile: 38 y.o. male  with past medical history of paroxysmal a-fib not adherent to Eliquis, chronic HFpEF, severe mitral regurgitation, and hyperthyroidism admitted on 07/09/2023 to [facility] with worsening shortness of breath, chest pain, and atrial fibrillation with RVR.   CTA showed segmental bilateral lower lobe pulmonary emboli and heparin started.  On 2/27 patient rapidly deteriorated developed V. tach/V-fib cardiac arrest. ROSC achieved after ~ 16 minutes of down time. Patient was intubated. Cardiology was consulted patient was placed on VA ECMO and was transferred to [facility]   Past Medical History:  Diagnosis Date   Atrial fibrillation [facility])    Hyperthyroidism    Mitral regurgitation    NSTEMI (non-ST elevated myocardial infarction) [facility])     Subjective:   I have reviewed medical records including EPIC notes, labs and imaging, received updates from nursing staff and attending MD, assessed the patient and then met in the patient's room with the patient's sister Aaron [name] and fiance Aaron [name] to Palliative Medicine as specialized medical care for people living with serious illness. It focuses on providing relief from symptoms and stress of a serious illness. The goal is to improve quality of life for both the patient and the family.   Today's Discussion: Patient's sister was surprised palliative care was consulted. She tells me "palliative care is the cousin to hospice care." I share that palliative care is for any patient with serious illness and hospice is for patients with an expected life expectancy of less than  six months. I also share that both hospice and palliative care focus on quality of life for family and patient.  Patient's fiance shares the patient was independent prior to this admission. She shares that he had increasing shortness of breath and fatigue leading up to the hospitalization. She shares that she made the patient come in for evaluation to the emergency department. They are both glad he did. Both the patient's sister and fiance share that he had chronic cardiovascular conditions. We discussed his current hospitalization including his cardiac arrest and him now being on ECMO. They admit this is a lot to process.  When I asked the patient's family to tell me about him they shared that he enjoyed playing video games for fun. I asked if he had children and his sister shared that he does.  Discussed the importance of continued conversation with family and the medical providers regarding overall plan of care and treatment options, ensuring decisions are within the context of the patient's values and GOCs.  Questions and concerns were addressed. The family was encouraged to call with questions or concerns. PMT will continue to support holistically.  Review of Systems  Unable to perform ROS   Objective:   Primary Diagnoses: Present on Admission:  Bilateral pulmonary embolism (HCC)  Hyperthyroidism  Chronic heart failure with preserved ejection fraction (HFpEF) (HCC)  Severe mitral regurgitation  Paroxysmal atrial fibrillation [facility])   Physical Exam Vitals reviewed.  Constitutional:      General: He is not in acute  distress.    Appearance: He is ill-appearing.     Interventions: He is sedated and intubated.     Comments: ECMO, CRRT  Pulmonary:     Effort: He is intubated.     Vital Signs:  BP (!) 86/73   Pulse 78   Temp 98.2 F (36.8 C)   Resp 10   Ht 6\' 4"  (1.93 m)   Wt 88.7 kg   SpO2 100%   BMI 23.80 kg/m   Palliative Assessment/Data: 30% with  feeds    Advanced Care Planning:   Existing Vynca/ACP Documentation: None  Primary Decision Maker: Patient's sister Aaron [name]  Code Status/Advance Care Planning: Full code    Assessment & Plan:   SUMMARY OF RECOMMENDATIONS   Full code Full scope PMT support as needed    Discussed with: bedside RN and Dr. Elwyn Lade  Time Total: 45 minutes    Thank you for allowing [place] to participate in the care of Aaron [name] PMT will continue to support holistically.   Signed by: Sarina Ser, NP Palliative Medicine Team  Team Phone # [phone] (Nights/Weekends)  07/12/2023, 3:41 PM

## 2023-07-12 NOTE — Progress Notes (Signed)
 Advanced Heart Failure Rounding Note  Cardiologist: Christell Constant, MD  Chief Complaint:  Subjective:    Admitted 2/26 with worsening shortness of breath, chest pain, atrial fibrillation with RVR. Decompensated 2/27 with IVF, nodal blockade with subsequent respiratory arrest, ROSC achieved after ~16 minute down time Femoral VA ecmo cannulation 2/27 with Impella CP vent  Lactic acid 2.0 this morning.   Tolerating CRRT on the circuit well, high filter pressures to be expected with ECMO circuit.   Turned down ECMO flows this morning given hemolysis.  Adjusted impella device with bedside echocardiogram, withdrew and advanced with clockwise rotation and improvement in positioning. 4cm from the annulus. Left at 85cm.  Discussed plan of care with sister and fiance at bedside.    CVP 10, PA 32/20, PCWP 17.   Objective:   Weight Range: 88.7 kg Body mass index is 23.8 kg/m.   Vital Signs:   Temp:  [97.5 F (36.4 C)-99 F (37.2 C)] 98.2 F (36.8 C) (03/01 1315) Pulse Rate:  [53-136] 131 (03/01 1315) Resp:  [0-18] 10 (03/01 1315) BP: (86)/(73) 86/73 (03/01 0131) SpO2:  [88 %-100 %] 100 % (03/01 1315) Arterial Line BP: (72-114)/(61-83) 83/61 (03/01 1315) FiO2 (%):  [50 %] 50 % (03/01 1059) Weight:  [88.7 kg] 88.7 kg (03/01 0500) Last BM Date : 07/10/23  Weight change: Filed Weights   07/09/23 1838 07/11/23 0600 07/12/23 0500  Weight: 97.5 kg 95.4 kg 88.7 kg    Intake/Output:   Intake/Output Summary (Last 24 hours) at 07/12/2023 1338 Last data filed at 07/12/2023 1300 Gross per 24 hour  Intake 3698.42 ml  Output 3699.7 ml  Net -1.28 ml      Physical Exam    General: Sedated, acutely ill-appearing Lungs: Mild bilateral rhonchi CV: Regular, systolic murmur best heard at the apex trace peripheral edema. JVP mildly elevated, right IJ Swan, left IJ triple-lumen, left femoral arterial return cannula with distal perfusion catheter, right femoral venous drainage  cannula, right femoral arterial Impella CP Abdomen: Soft, no distention.  Neurologic: Sedated   Telemetry   Sinus tachycardia   Labs    CBC Recent Labs    07/12/23 0427 07/12/23 0440 07/12/23 0657 07/12/23 0758 07/12/23 1158 07/12/23 1207  WBC 28.7*  --  27.3*  --   --   --   HGB 8.0*   < > 7.8*   < > 8.8* 9.9*  HCT 23.9*   < > 23.6*   < > 26.0* 29.0*  MCV 73.3*  --  73.5*  --   --   --   PLT 96*  --  94*  --   --   --    < > = values in this interval not displayed.   Basic Metabolic Panel Recent Labs    54/09/81 0737 07/11/23 0742 07/11/23 1600 07/11/23 1654 07/12/23 0427 07/12/23 0440 07/12/23 1006 07/12/23 1158 07/12/23 1207  NA 141   < > 140   < > 138   < > 140 137 141  K 7.0*   < > 6.2*   < > 4.6   < > 3.9 4.1 4.1  CL 107  --  104   < > 99   < > 92*  --  93*  CO2 22  --  26  --  25  --   --   --   --   GLUCOSE 82  --  132*   < > 197*   < > 247*  --  230*  BUN 41*  --  46*   < > 38*   < > 38*  --  40*  CREATININE 3.00*  --  3.48*   < > 3.03*   < > 2.30*  --  2.50*  CALCIUM 7.8*  --  8.5*  --  9.2  --   --   --   --   MG 2.1  --   --   --  1.7  --   --   --   --   PHOS  --   --  5.3*  --  5.6*  --   --   --   --    < > = values in this interval not displayed.   Liver Function Tests Recent Labs    07/11/23 0737 07/11/23 1600 07/12/23 0427  AST 1,894*  --  1,324*  ALT 606*  --  472*  ALKPHOS 78  --  71  BILITOT 7.0*  --  8.6*  PROT 5.2*  --  4.6*  ALBUMIN 2.5* 2.4* 2.1*   Recent Labs    07/09/23 1934  LIPASE 23   Cardiac Enzymes Recent Labs    07/11/23 0512 07/11/23 1854  CKTOTAL 117 357    BNP: BNP (last 3 results) Recent Labs    07/09/23 1934 07/10/23 1224  BNP 703.7* 980.5*    ProBNP (last 3 results) No results for input(s): "PROBNP" in the last 8760 hours.   D-Dimer Recent Labs    07/09/23 1934  DDIMER 1.83*   Hemoglobin A1C Recent Labs    07/10/23 0644  HGBA1C 4.8   Fasting Lipid Panel Recent Labs     07/11/23 0512  TRIG 42   Thyroid Function Tests Recent Labs    07/11/23 0737 07/12/23 0427  TSH  --  0.016*  T3FREE 4.2  --      Medications:     Scheduled Medications:  Chlorhexidine Gluconate Cloth  6 each Topical Daily   docusate  100 mg Per Tube BID   feeding supplement (VITAL HIGH PROTEIN)  1,000 mL Per Tube Q24H   fentaNYL (SUBLIMAZE) injection  50 mcg Intravenous Once   folic acid  1 mg Per Tube Daily   hydrocortisone sod succinate (SOLU-CORTEF) inj  100 mg Intravenous Q8H   insulin aspart  0-15 Units Subcutaneous Q4H   lactulose  20 g Per Tube TID   methimazole  15 mg Per Tube Q8H   metoCLOPramide (REGLAN) injection  10 mg Intravenous Q8H   multivitamin  1 tablet Per Tube QHS   mouth rinse  15 mL Mouth Rinse Q2H   pantoprazole (PROTONIX) IV  40 mg Intravenous QHS   polyethylene glycol  17 g Per Tube Daily   sodium bicarbonate  50 mEq Intravenous Once   sodium chloride flush  3 mL Intravenous Q12H   sorbitol  30 mL Per Tube Once   thiamine  100 mg Per Tube Daily   Or   thiamine  100 mg Intravenous Daily    Infusions:  albumin human     bivalirudin (ANGIOMAX) 250 mg in sodium chloride 0.9 % 500 mL (0.5 mg/mL) infusion 0.03 mg/kg/hr (07/12/23 1300)   calcium gluconate 20 g in dextrose 5 % 1,000 mL infusion 60 mL/hr at 07/12/23 1300   citrate dextrose 340 mL/hr at 07/12/23 1208   dexmedetomidine (PRECEDEX) IV infusion 0.4 mcg/kg/hr (07/12/23 1300)   fentaNYL infusion INTRAVENOUS 100 mcg/hr (07/12/23 1300)   meropenem (MERREM) IV 1 g (07/12/23  1338)   norepinephrine (LEVOPHED) Adult infusion Stopped (07/12/23 0428)   PrismaSol BGK 2/3.5 50 mL/hr at 07/11/23 1039   PrismaSol BGK 2/3.5 400 mL/hr at 07/12/23 1309   prismasol BGK 4/2.5 1,500 mL/hr at 07/12/23 1308   sodium bicarbonate 25 mEq (Impella PURGE) in dextrose 5 % 1000 mL bag     vancomycin Stopped (07/12/23 1046)   vasopressin 0.01 Units/min (07/12/23 1300)    PRN Medications: acetaminophen **OR**  acetaminophen, albumin human, fentaNYL, heparin, midazolam, mouth rinse    Patient Profile   Patient with a history of Grave's disease, severe primary mitral regurgitation who presented with chest pain and segmental PE. Subsequent progressed to SCAI Stage E shock requiring VA ECMO cannulation on 2/27.   Assessment/Plan   SCAI Stage E Cardiogenic shock - Suspect hypoxic respiratory failure driven with segmental PE on top of severe MR, nodal blockage, and thyrotoxicosis - Down time ~ 20 minutes - Impella CP placed for LV vent given severe MR - Lactic acid resolved - Swan indicates adequate unloading - Turn down ECMO flow to 4-4.2L given evidence of hemolysis - Impella CP at P2, withdrawn under echo guidance - Vanc/meropenem for antibiotic prophylaxis, suspect leukocytosis is reactive and in the setting of steroids  Severe mitral valve regurgitation - Noted on previous echocardiogram - Suspect contributing to ongoing shock - EcPella for management currently - TCTS consult for consideration of mitral valve repair - Woke up yesterday with purposeful movements, not following commands. 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 - Continue IV hydrocortisone 100mg  q8 - Continue methimazole to 15mg  q8 - Repeat thyroid labs next week - Will need definitive outpatient treatment  Atrial fibrillation - Present on arrival, in the setting of PE and thyroid disease - Currently in sinus tachycardia -  Bivalrudin for anticoagulation  Pulmonary embolism:  - Segmental without right heart strain - Bivalrudin as above  CAD - Prior embolic infarct in the LAD territory with corresponding LGE on CMR - Lifelong anticoagulation  Sedation - Fentanyl, precedex - Versed as needed - Spot EEG without evidence of seizure - Notably minimal alcohol use after discussion with fiance.   Medication concerns reviewed with patient and pharmacy team. Barriers identified: antibiotic dosing, CRRT  Length of Stay: 3  Romie Minus, MD  07/12/2023, 1:38 PM  Advanced Heart Failure Team Pager [phone] (M-F; 7a - 5p)  Please contact [facility] for night-coverage after hours (5p -7a ) and weekends on [email or web address]  CRITICAL CARE Performed by: Romie Minus   Total critical care time: 70 minutes  Critical care time was exclusive of separately billable procedures and treating other patients.  Critical care was necessary to treat or prevent imminent or life-threatening deterioration.  Critical care was time spent personally by me on the following activities: development of treatment plan with patient and/or surrogate as well as nursing, discussions with consultants, evaluation of patient's response to treatment, examination of patient, obtaining history from patient or surrogate, ordering and performing treatments and interventions, ordering and review  of laboratory studies, ordering and review of radiographic studies, pulse oximetry and re-evaluation of patient's condition.

## 2023-07-12 NOTE — Plan of Care (Signed)
  Problem: Clinical Measurements: Goal: Diagnostic test results will improve Outcome: Progressing Goal: Cardiovascular complication will be avoided Outcome: Progressing   Problem: Fluid Volume: Goal: Ability to maintain a balanced intake and output will improve Outcome: Progressing

## 2023-07-12 NOTE — Progress Notes (Signed)
 PHARMACY - ANTICOAGULATION CONSULT NOTE  Pharmacy Consult for bivalirudin Indication:  ECMO, Impella  No Known Allergies  Patient Measurements: Height: 6\' 4"  (193 cm) Weight: 95.4 kg (210 lb 5.1 oz) (w/ foot board) IBW/kg (Calculated) : 86.8 Heparin Dosing Weight: n/a  Vital Signs: Temp: 98.8 F (37.1 C) (02/28 2300) Temp Source: Core (02/28 2000) Pulse Rate: 134 (02/28 2300)  Labs: Recent Labs    07/09/23 2047 07/10/23 0644 07/10/23 1224 07/10/23 1310 07/10/23 1425 07/10/23 1428 07/10/23 1708 07/10/23 1709 07/10/23 2015 07/10/23 2034 07/10/23 2238 07/10/23 2302 07/11/23 0404 07/11/23 0407 07/11/23 0512 07/11/23 0540 07/11/23 1034 07/11/23 1314 07/11/23 1635 07/11/23 1654 07/11/23 1700 07/11/23 1853 07/11/23 1854 07/11/23 2024 07/11/23 2238 07/11/23 2340 07/12/23 0000 07/12/23 0022 07/12/23 0027  HGB  --    < > 10.6*   < > 10.1*   < > 10.0*   < > 9.6*   < >  --    < > 9.0*   < >  --    < >  --    < >  --    < > 7.9*   < >  --    < > 9.2* 7.8*  --  7.5* 9.2*  HCT  --    < > 37.0*   < > 33.7*   < > 31.3*   < > 30.1*   < >  --    < > 28.0*   < >  --    < >  --    < >  --    < > 25.9*   < >  --    < > 27.0* 24.1*  --  22.0* 27.0*  PLT  --    < > 269  --  199  --  199  --  210  --   --   --  185  --   --   --   --   --   --   --  152  --   --   --   --  118*  --   --   --   APTT  --   --   --   --  134*   < >  --   --  76*  --  50*  --   --   --   --    < > 52*  --  65*  --   --   --   --   --   --   --  67*  --   --   LABPROT  --   --  31.8*  --   --   --  42.0*  --  36.0*  --   --   --  45.2*  --   --   --   --   --   --   --   --   --   --   --   --   --   --   --   --   INR  --   --  3.1*  --   --   --  4.4*  --  3.6*  --   --   --  4.8*  --   --   --   --   --   --   --   --   --   --   --   --   --   --   --   --  HEPARINUNFRC  --    < > <0.10*  --   --   --  0.23*  --   --   --  <0.10*  --   --   --   --   --   --   --   --   --   --   --   --   --   --    --   --   --   --   CREATININE  --    < > 1.53*   < > 1.88*   < > 1.70*  --   --   --   --    < >  --   --  2.76*   < >  --    < >  --    < >  --    < >  --    < > 2.10*  --   --  3.50* 2.20*  CKTOTAL  --   --   --   --   --   --   --   --   --   --   --   --   --   --  117  --   --   --   --   --   --   --  357  --   --   --   --   --   --   TROPONINIHS 56*  --  263*  --  597*  --   --   --   --   --   --   --   --   --   --   --   --   --   --   --   --   --   --   --   --   --   --   --   --    < > = values in this interval not displayed.    Estimated Creatinine Clearance: 55.9 mL/min (A) (by C-G formula based on SCr of 2.2 mg/dL (H)).   Medical History: Past Medical History:  Diagnosis Date   Atrial fibrillation [facility])    Hyperthyroidism    Mitral regurgitation    NSTEMI (non-ST elevated myocardial infarction) [facility])     Assessment: 38 yo M with bilateral PE s/p TNK (2/27 @1156 ) now on [place] ECMO + Impella. Pharmacy consulted for bivalirudin for anticoagulation.   aPTT 67 - therapeutic  Hgb 7.8, Plt 152 >118  Goal of Therapy:  aPTT 50-70 seconds Monitor platelets by anticoagulation protocol: Yes   Plan:  Continue bivalirudin 0.03mg /kg/hr  aPTT q6h until therapeutic then transition to q12h F/u CBC, LDH, fibrinogen Monitor s/s bleeding   Arabella Merles, PharmD. Clinical Pharmacist 07/12/2023 1:02 AM

## 2023-07-13 ENCOUNTER — Inpatient Hospital Stay (HOSPITAL_COMMUNITY)

## 2023-07-13 DIAGNOSIS — R079 Chest pain, unspecified: Secondary | ICD-10-CM | POA: Diagnosis not present

## 2023-07-13 DIAGNOSIS — J984 Other disorders of lung: Secondary | ICD-10-CM | POA: Diagnosis not present

## 2023-07-13 DIAGNOSIS — I480 Paroxysmal atrial fibrillation: Secondary | ICD-10-CM | POA: Diagnosis not present

## 2023-07-13 DIAGNOSIS — I2699 Other pulmonary embolism without acute cor pulmonale: Secondary | ICD-10-CM | POA: Diagnosis not present

## 2023-07-13 DIAGNOSIS — D649 Anemia, unspecified: Secondary | ICD-10-CM | POA: Diagnosis not present

## 2023-07-13 DIAGNOSIS — J9811 Atelectasis: Secondary | ICD-10-CM | POA: Diagnosis not present

## 2023-07-13 DIAGNOSIS — Z4682 Encounter for fitting and adjustment of non-vascular catheter: Secondary | ICD-10-CM | POA: Diagnosis not present

## 2023-07-13 DIAGNOSIS — J9601 Acute respiratory failure with hypoxia: Secondary | ICD-10-CM | POA: Diagnosis not present

## 2023-07-13 DIAGNOSIS — R0602 Shortness of breath: Secondary | ICD-10-CM | POA: Diagnosis not present

## 2023-07-13 DIAGNOSIS — N170 Acute kidney failure with tubular necrosis: Secondary | ICD-10-CM | POA: Diagnosis not present

## 2023-07-13 DIAGNOSIS — N179 Acute kidney failure, unspecified: Secondary | ICD-10-CM | POA: Diagnosis not present

## 2023-07-13 DIAGNOSIS — R570 Cardiogenic shock: Secondary | ICD-10-CM | POA: Diagnosis not present

## 2023-07-13 DIAGNOSIS — E872 Acidosis, unspecified: Secondary | ICD-10-CM | POA: Diagnosis not present

## 2023-07-13 DIAGNOSIS — I340 Nonrheumatic mitral (valve) insufficiency: Secondary | ICD-10-CM | POA: Diagnosis not present

## 2023-07-13 DIAGNOSIS — J90 Pleural effusion, not elsewhere classified: Secondary | ICD-10-CM | POA: Diagnosis not present

## 2023-07-13 DIAGNOSIS — I469 Cardiac arrest, cause unspecified: Secondary | ICD-10-CM | POA: Diagnosis not present

## 2023-07-13 DIAGNOSIS — E875 Hyperkalemia: Secondary | ICD-10-CM | POA: Diagnosis not present

## 2023-07-13 LAB — POCT I-STAT 7, (LYTES, BLD GAS, ICA,H+H)
Acid-Base Excess: 13 mmol/L — ABNORMAL HIGH (ref 0.0–2.0)
Acid-Base Excess: 15 mmol/L — ABNORMAL HIGH (ref 0.0–2.0)
Acid-Base Excess: 15 mmol/L — ABNORMAL HIGH (ref 0.0–2.0)
Acid-Base Excess: 15 mmol/L — ABNORMAL HIGH (ref 0.0–2.0)
Acid-Base Excess: 15 mmol/L — ABNORMAL HIGH (ref 0.0–2.0)
Acid-Base Excess: 16 mmol/L — ABNORMAL HIGH (ref 0.0–2.0)
Acid-Base Excess: 16 mmol/L — ABNORMAL HIGH (ref 0.0–2.0)
Acid-Base Excess: 17 mmol/L — ABNORMAL HIGH (ref 0.0–2.0)
Bicarbonate: 37.5 mmol/L — ABNORMAL HIGH (ref 20.0–28.0)
Bicarbonate: 38.3 mmol/L — ABNORMAL HIGH (ref 20.0–28.0)
Bicarbonate: 38.9 mmol/L — ABNORMAL HIGH (ref 20.0–28.0)
Bicarbonate: 39.4 mmol/L — ABNORMAL HIGH (ref 20.0–28.0)
Bicarbonate: 39.9 mmol/L — ABNORMAL HIGH (ref 20.0–28.0)
Bicarbonate: 40 mmol/L — ABNORMAL HIGH (ref 20.0–28.0)
Bicarbonate: 40.4 mmol/L — ABNORMAL HIGH (ref 20.0–28.0)
Bicarbonate: 40.9 mmol/L — ABNORMAL HIGH (ref 20.0–28.0)
Calcium, Ion: 1.07 mmol/L — ABNORMAL LOW (ref 1.15–1.40)
Calcium, Ion: 1.07 mmol/L — ABNORMAL LOW (ref 1.15–1.40)
Calcium, Ion: 1.07 mmol/L — ABNORMAL LOW (ref 1.15–1.40)
Calcium, Ion: 1.09 mmol/L — ABNORMAL LOW (ref 1.15–1.40)
Calcium, Ion: 1.12 mmol/L — ABNORMAL LOW (ref 1.15–1.40)
Calcium, Ion: 1.12 mmol/L — ABNORMAL LOW (ref 1.15–1.40)
Calcium, Ion: 1.13 mmol/L — ABNORMAL LOW (ref 1.15–1.40)
Calcium, Ion: 1.15 mmol/L (ref 1.15–1.40)
HCT: 21 % — ABNORMAL LOW (ref 39.0–52.0)
HCT: 22 % — ABNORMAL LOW (ref 39.0–52.0)
HCT: 22 % — ABNORMAL LOW (ref 39.0–52.0)
HCT: 23 % — ABNORMAL LOW (ref 39.0–52.0)
HCT: 23 % — ABNORMAL LOW (ref 39.0–52.0)
HCT: 24 % — ABNORMAL LOW (ref 39.0–52.0)
HCT: 24 % — ABNORMAL LOW (ref 39.0–52.0)
HCT: 25 % — ABNORMAL LOW (ref 39.0–52.0)
Hemoglobin: 7.1 g/dL — ABNORMAL LOW (ref 13.0–17.0)
Hemoglobin: 7.5 g/dL — ABNORMAL LOW (ref 13.0–17.0)
Hemoglobin: 7.5 g/dL — ABNORMAL LOW (ref 13.0–17.0)
Hemoglobin: 7.8 g/dL — ABNORMAL LOW (ref 13.0–17.0)
Hemoglobin: 7.8 g/dL — ABNORMAL LOW (ref 13.0–17.0)
Hemoglobin: 8.2 g/dL — ABNORMAL LOW (ref 13.0–17.0)
Hemoglobin: 8.2 g/dL — ABNORMAL LOW (ref 13.0–17.0)
Hemoglobin: 8.5 g/dL — ABNORMAL LOW (ref 13.0–17.0)
O2 Saturation: 96 %
O2 Saturation: 97 %
O2 Saturation: 98 %
O2 Saturation: 98 %
O2 Saturation: 99 %
O2 Saturation: 99 %
O2 Saturation: 99 %
O2 Saturation: 99 %
Patient temperature: 36.7
Patient temperature: 36.7
Patient temperature: 36.8
Patient temperature: 36.8
Patient temperature: 36.8
Patient temperature: 36.8
Patient temperature: 36.9
Patient temperature: 36.9
Potassium: 3.8 mmol/L (ref 3.5–5.1)
Potassium: 3.8 mmol/L (ref 3.5–5.1)
Potassium: 3.9 mmol/L (ref 3.5–5.1)
Potassium: 3.9 mmol/L (ref 3.5–5.1)
Potassium: 3.9 mmol/L (ref 3.5–5.1)
Potassium: 4 mmol/L (ref 3.5–5.1)
Potassium: 4 mmol/L (ref 3.5–5.1)
Potassium: 4 mmol/L (ref 3.5–5.1)
Sodium: 138 mmol/L (ref 135–145)
Sodium: 138 mmol/L (ref 135–145)
Sodium: 139 mmol/L (ref 135–145)
Sodium: 139 mmol/L (ref 135–145)
Sodium: 139 mmol/L (ref 135–145)
Sodium: 139 mmol/L (ref 135–145)
Sodium: 140 mmol/L (ref 135–145)
Sodium: 140 mmol/L (ref 135–145)
TCO2: 39 mmol/L — ABNORMAL HIGH (ref 22–32)
TCO2: 40 mmol/L — ABNORMAL HIGH (ref 22–32)
TCO2: 40 mmol/L — ABNORMAL HIGH (ref 22–32)
TCO2: 41 mmol/L — ABNORMAL HIGH (ref 22–32)
TCO2: 41 mmol/L — ABNORMAL HIGH (ref 22–32)
TCO2: 41 mmol/L — ABNORMAL HIGH (ref 22–32)
TCO2: 42 mmol/L — ABNORMAL HIGH (ref 22–32)
TCO2: 42 mmol/L — ABNORMAL HIGH (ref 22–32)
pCO2 arterial: 41.4 mmHg (ref 32–48)
pCO2 arterial: 45.3 mmHg (ref 32–48)
pCO2 arterial: 45.6 mmHg (ref 32–48)
pCO2 arterial: 47.1 mmHg (ref 32–48)
pCO2 arterial: 47.6 mmHg (ref 32–48)
pCO2 arterial: 48.7 mmHg — ABNORMAL HIGH (ref 32–48)
pCO2 arterial: 49.3 mmHg — ABNORMAL HIGH (ref 32–48)
pCO2 arterial: 52.1 mmHg — ABNORMAL HIGH (ref 32–48)
pH, Arterial: 7.491 — ABNORMAL HIGH (ref 7.35–7.45)
pH, Arterial: 7.508 — ABNORMAL HIGH (ref 7.35–7.45)
pH, Arterial: 7.51 — ABNORMAL HIGH (ref 7.35–7.45)
pH, Arterial: 7.519 — ABNORMAL HIGH (ref 7.35–7.45)
pH, Arterial: 7.522 — ABNORMAL HIGH (ref 7.35–7.45)
pH, Arterial: 7.554 — ABNORMAL HIGH (ref 7.35–7.45)
pH, Arterial: 7.563 — ABNORMAL HIGH (ref 7.35–7.45)
pH, Arterial: 7.574 — ABNORMAL HIGH (ref 7.35–7.45)
pO2, Arterial: 105 mmHg (ref 83–108)
pO2, Arterial: 115 mmHg — ABNORMAL HIGH (ref 83–108)
pO2, Arterial: 118 mmHg — ABNORMAL HIGH (ref 83–108)
pO2, Arterial: 133 mmHg — ABNORMAL HIGH (ref 83–108)
pO2, Arterial: 136 mmHg — ABNORMAL HIGH (ref 83–108)
pO2, Arterial: 72 mmHg — ABNORMAL LOW (ref 83–108)
pO2, Arterial: 85 mmHg (ref 83–108)
pO2, Arterial: 86 mmHg (ref 83–108)

## 2023-07-13 LAB — BASIC METABOLIC PANEL
Anion gap: 13 (ref 5–15)
Anion gap: 13 (ref 5–15)
BUN: 36 mg/dL — ABNORMAL HIGH (ref 6–20)
BUN: 37 mg/dL — ABNORMAL HIGH (ref 6–20)
CO2: 32 mmol/L (ref 22–32)
CO2: 34 mmol/L — ABNORMAL HIGH (ref 22–32)
Calcium: 9.5 mg/dL (ref 8.9–10.3)
Calcium: 9.3 mg/dL (ref 8.9–10.3)
Chloride: 93 mmol/L — ABNORMAL LOW (ref 98–111)
Chloride: 94 mmol/L — ABNORMAL LOW (ref 98–111)
Creatinine, Ser: 3.09 mg/dL — ABNORMAL HIGH (ref 0.61–1.24)
Creatinine, Ser: 3.2 mg/dL — ABNORMAL HIGH (ref 0.61–1.24)
GFR, Estimated: 26 mL/min — ABNORMAL LOW (ref >60)
GFR, Estimated: 24 mL/min — ABNORMAL LOW (ref >60)
Glucose, Bld: 223 mg/dL — ABNORMAL HIGH (ref 70–99)
Glucose, Bld: 168 mg/dL — ABNORMAL HIGH (ref 70–99)
Potassium: 3.9 mmol/L (ref 3.5–5.1)
Potassium: 4 mmol/L (ref 3.5–5.1)
Sodium: 138 mmol/L (ref 135–145)
Sodium: 141 mmol/L (ref 135–145)

## 2023-07-13 LAB — APTT
aPTT: 60 s — ABNORMAL HIGH (ref 24–36)
aPTT: 61 s — ABNORMAL HIGH (ref 24–36)

## 2023-07-13 LAB — POCT I-STAT, CHEM 8
BUN: 33 mg/dL — ABNORMAL HIGH (ref 6–20)
BUN: 37 mg/dL — ABNORMAL HIGH (ref 6–20)
BUN: 38 mg/dL — ABNORMAL HIGH (ref 6–20)
BUN: 39 mg/dL — ABNORMAL HIGH (ref 6–20)
Calcium, Ion: 0.33 mmol/L — CL (ref 1.15–1.40)
Calcium, Ion: 0.34 mmol/L — CL (ref 1.15–1.40)
Calcium, Ion: 0.34 mmol/L — CL (ref 1.15–1.40)
Calcium, Ion: 1.15 mmol/L (ref 1.15–1.40)
Chloride: 88 mmol/L — ABNORMAL LOW (ref 98–111)
Chloride: 90 mmol/L — ABNORMAL LOW (ref 98–111)
Chloride: 90 mmol/L — ABNORMAL LOW (ref 98–111)
Chloride: 92 mmol/L — ABNORMAL LOW (ref 98–111)
Creatinine, Ser: 2.4 mg/dL — ABNORMAL HIGH (ref 0.61–1.24)
Creatinine, Ser: 2.4 mg/dL — ABNORMAL HIGH (ref 0.61–1.24)
Creatinine, Ser: 2.5 mg/dL — ABNORMAL HIGH (ref 0.61–1.24)
Creatinine, Ser: 3.6 mg/dL — ABNORMAL HIGH (ref 0.61–1.24)
Glucose, Bld: 160 mg/dL — ABNORMAL HIGH (ref 70–99)
Glucose, Bld: 232 mg/dL — ABNORMAL HIGH (ref 70–99)
Glucose, Bld: 263 mg/dL — ABNORMAL HIGH (ref 70–99)
Glucose, Bld: 270 mg/dL — ABNORMAL HIGH (ref 70–99)
HCT: 27 % — ABNORMAL LOW (ref 39.0–52.0)
HCT: 28 % — ABNORMAL LOW (ref 39.0–52.0)
HCT: 29 % — ABNORMAL LOW (ref 39.0–52.0)
HCT: 31 % — ABNORMAL LOW (ref 39.0–52.0)
Hemoglobin: 10.5 g/dL — ABNORMAL LOW (ref 13.0–17.0)
Hemoglobin: 9.2 g/dL — ABNORMAL LOW (ref 13.0–17.0)
Hemoglobin: 9.5 g/dL — ABNORMAL LOW (ref 13.0–17.0)
Hemoglobin: 9.9 g/dL — ABNORMAL LOW (ref 13.0–17.0)
Potassium: 3.7 mmol/L (ref 3.5–5.1)
Potassium: 3.8 mmol/L (ref 3.5–5.1)
Potassium: 3.9 mmol/L (ref 3.5–5.1)
Potassium: 4 mmol/L (ref 3.5–5.1)
Sodium: 138 mmol/L (ref 135–145)
Sodium: 140 mmol/L (ref 135–145)
Sodium: 140 mmol/L (ref 135–145)
Sodium: 141 mmol/L (ref 135–145)
TCO2: 35 mmol/L — ABNORMAL HIGH (ref 22–32)
TCO2: 35 mmol/L — ABNORMAL HIGH (ref 22–32)
TCO2: 36 mmol/L — ABNORMAL HIGH (ref 22–32)
TCO2: 37 mmol/L — ABNORMAL HIGH (ref 22–32)

## 2023-07-13 LAB — CBC
HCT: 22.9 % — ABNORMAL LOW (ref 39.0–52.0)
HCT: 20.6 % — ABNORMAL LOW (ref 39.0–52.0)
Hemoglobin: 8 g/dL — ABNORMAL LOW (ref 13.0–17.0)
Hemoglobin: 7.4 g/dL — ABNORMAL LOW (ref 13.0–17.0)
MCH: 26.1 pg (ref 26.0–34.0)
MCH: 27.6 pg (ref 26.0–34.0)
MCHC: 34.9 g/dL (ref 30.0–36.0)
MCHC: 35.9 g/dL (ref 30.0–36.0)
MCV: 74.8 fL — ABNORMAL LOW (ref 80.0–100.0)
MCV: 76.9 fL — ABNORMAL LOW (ref 80.0–100.0)
Platelets: 88 10*3/uL — ABNORMAL LOW (ref 150–400)
Platelets: 86 10*3/uL — ABNORMAL LOW (ref 150–400)
RBC: 3.06 MIL/uL — ABNORMAL LOW (ref 4.22–5.81)
RBC: 2.68 MIL/uL — ABNORMAL LOW (ref 4.22–5.81)
RDW: 18.6 % — ABNORMAL HIGH (ref 11.5–15.5)
RDW: 19.1 % — ABNORMAL HIGH (ref 11.5–15.5)
WBC: 31.1 10*3/uL — ABNORMAL HIGH (ref 4.0–10.5)
WBC: 32.1 10*3/uL — ABNORMAL HIGH (ref 4.0–10.5)
nRBC: 1.4 % — ABNORMAL HIGH (ref 0.0–0.2)
nRBC: 1.2 % — ABNORMAL HIGH (ref 0.0–0.2)

## 2023-07-13 LAB — GLUCOSE, CAPILLARY
Glucose-Capillary: 149 mg/dL — ABNORMAL HIGH (ref 70–99)
Glucose-Capillary: 152 mg/dL — ABNORMAL HIGH (ref 70–99)
Glucose-Capillary: 166 mg/dL — ABNORMAL HIGH (ref 70–99)
Glucose-Capillary: 190 mg/dL — ABNORMAL HIGH (ref 70–99)
Glucose-Capillary: 217 mg/dL — ABNORMAL HIGH (ref 70–99)
Glucose-Capillary: 229 mg/dL — ABNORMAL HIGH (ref 70–99)

## 2023-07-13 LAB — PHOSPHORUS: Phosphorus: 2.9 mg/dL (ref 2.5–4.6)

## 2023-07-13 LAB — TSH: TSH: <0.01 u[IU]/mL — ABNORMAL LOW (ref 0.350–4.500)

## 2023-07-13 LAB — HEPATIC FUNCTION PANEL
ALT: 355 U/L — ABNORMAL HIGH (ref 0–44)
AST: 970 U/L — ABNORMAL HIGH (ref 15–41)
Albumin: 2 g/dL — ABNORMAL LOW (ref 3.5–5.0)
Alkaline Phosphatase: 74 U/L (ref 38–126)
Bilirubin, Direct: 2.3 mg/dL — ABNORMAL HIGH (ref 0.0–0.2)
Indirect Bilirubin: 5 mg/dL — ABNORMAL HIGH (ref 0.3–0.9)
Total Bilirubin: 7.3 mg/dL — ABNORMAL HIGH (ref 0.0–1.2)
Total Protein: 4.7 g/dL — ABNORMAL LOW (ref 6.5–8.1)

## 2023-07-13 LAB — LACTATE DEHYDROGENASE: LDH: >2500 U/L — ABNORMAL HIGH (ref 98–192)

## 2023-07-13 LAB — T4, FREE: Free T4: 0.86 ng/dL (ref 0.61–1.12)

## 2023-07-13 LAB — PROTIME-INR
INR: 1.5 — ABNORMAL HIGH (ref 0.8–1.2)
Prothrombin Time: 18.6 s — ABNORMAL HIGH (ref 11.4–15.2)

## 2023-07-13 LAB — MAGNESIUM: Magnesium: 2.4 mg/dL (ref 1.7–2.4)

## 2023-07-13 LAB — CG4 I-STAT (LACTIC ACID): Lactic Acid, Venous: 1.3 mmol/L (ref 0.5–1.9)

## 2023-07-13 LAB — FIBRINOGEN: Fibrinogen: 275 mg/dL (ref 210–475)

## 2023-07-13 LAB — PREPARE RBC (CROSSMATCH)

## 2023-07-13 MED ORDER — VITAL HIGH PROTEIN PO LIQD
1000.0000 mL | ORAL | Status: DC
  Administered 2023-07-13 – 2023-07-14 (×3): 1000 mL

## 2023-07-13 MED ORDER — SODIUM CHLORIDE 0.9% IV SOLUTION: Freq: Once | INTRAVENOUS | Status: AC

## 2023-07-13 MED ORDER — INSULIN ASPART 100 UNIT/ML IJ SOLN
4.0000 [IU] | INTRAMUSCULAR | Status: DC
  Administered 2023-07-13 – 2023-07-14 (×5): 4 [IU] via SUBCUTANEOUS

## 2023-07-13 MED ORDER — PRISMASOL BGK 4/2.5 32-4-2.5 MEQ/L EC SOLN: Status: DC

## 2023-07-13 MED ORDER — ACETAZOLAMIDE 250 MG PO TABS
500.0000 mg | ORAL_TABLET | Freq: Once | ORAL | Status: AC
  Administered 2023-07-13: 500 mg
  Filled 2023-07-13: qty 2

## 2023-07-13 NOTE — Progress Notes (Signed)
 PHARMACY - ANTICOAGULATION CONSULT NOTE  Pharmacy Consult for bivalirudin Indication:  ECMO, Impella  No Known Allergies  Patient Measurements: Height: 6\' 4"  (193 cm) Weight: 90.9 kg (200 lb 6.4 oz) IBW/kg (Calculated) : 86.8 Heparin Dosing Weight: n/a  Vital Signs: Temp: 98.2 F (36.8 C) (03/02 1900) Pulse Rate: 72 (03/02 1900)  Labs: Recent Labs    07/10/23 2238 07/10/23 2302 07/11/23 0404 07/11/23 0407 07/11/23 0512 07/11/23 0540 07/11/23 1854 07/11/23 2024 07/12/23 0427 07/12/23 0440 07/12/23 1600 07/12/23 1604 07/13/23 0403 07/13/23 0409 07/13/23 1607 07/13/23 1608 07/13/23 1635 07/13/23 1805  HGB  --    < > 9.0*   < >  --    < >  --    < > 8.0*   < > 7.8*   < > 8.0*   < > 7.4* 9.5* 9.2* 7.1*  HCT  --    < > 28.0*   < >  --    < >  --    < > 23.9*   < > 23.0*   < > 22.9*   < > 20.6* 28.0* 27.0* 21.0*  PLT  --   --  185  --   --    < >  --    < > 96*   < > 91*  --  88*  --  86*  --   --   --   APTT 50*  --   --   --   --    < >  --    < >  --    < > 61*  --  60*  --  61*  --   --   --   LABPROT  --   --  45.2*  --   --   --   --   --  28.1*  --   --   --  18.6*  --   --   --   --   --   INR  --   --  4.8*  --   --   --   --   --  2.6*  --   --   --  1.5*  --   --   --   --   --   HEPARINUNFRC <0.10*  --   --   --   --   --   --   --   --   --   --   --   --   --   --   --   --   --   CREATININE  --    < >  --   --  2.76*   < >  --    < > 3.03*   < > 3.01*   < > 3.09*   < > 3.20* 3.60* 2.40*  --   CKTOTAL  --   --   --   --  117  --  357  --   --   --   --   --   --   --   --   --   --   --    < > = values in this interval not displayed.    Estimated Creatinine Clearance: 51.2 mL/min (A) (by C-G formula based on SCr of 2.4 mg/dL (H)).   Medical History: Past Medical History:  Diagnosis Date   Atrial fibrillation (HCC)    Hyperthyroidism    Mitral  regurgitation    NSTEMI (non-ST elevated myocardial infarction) [facility])     Assessment: 38 yo Aaron [name] with  bilateral PE s/p TNK (2/27 @1156 ) now on [place] ECMO + Impella. Pharmacy consulted for bivalirudin for anticoagulation.   aPTT remains therapeutic at 61, on bivalirudin 0.03 mg/kg/hr. Hgb 8, Plt 88, LDH >2500, fibrinogen 275. LFTs trending downward - total bilirubin similar at 7.3. No active bleeding. Remains on CRRT - requiring citrate. Minimal  fibrin in ECMO circuit (at 9 o'clock).   Goal of Therapy:  aPTT 50-70 seconds Monitor platelets by anticoagulation protocol: Yes   Plan:  Continue bivalirudin 0.03mg /kg/hr (using dose weight 97.5 kg) aPTT q12h F/u CBC, LDH, fibrinogen Monitor s/s bleeding   Thank you for allowing pharmacy to participate in this patient's care,  Jenetta Downer, [facility] Clinical Pharmacist  07/13/2023 7:29 PM   [facility] pharmacy phone numbers are listed on [email or web address]

## 2023-07-13 NOTE — Plan of Care (Signed)
  Problem: Education: Goal: Knowledge of General Education information will improve Description: Including pain rating scale, medication(s)/side effects and non-pharmacologic comfort measures Outcome: Progressing   Problem: Clinical Measurements: Goal: Ability to maintain clinical measurements within normal limits will improve Outcome: Progressing Goal: Will remain free from infection Outcome: Progressing Goal: Diagnostic test results will improve Outcome: Progressing Goal: Respiratory complications will improve Outcome: Progressing Goal: Cardiovascular complication will be avoided Outcome: Progressing   Problem: Activity: Goal: Risk for activity intolerance will decrease Outcome: Progressing   Problem: Coping: Goal: Level of anxiety will decrease Outcome: Progressing   Problem: Elimination: Goal: Will not experience complications related to bowel motility Outcome: Progressing Goal: Will not experience complications related to urinary retention Outcome: Progressing   Problem: Pain Managment: Goal: General experience of comfort will improve and/or be controlled Outcome: Progressing   Problem: Safety: Goal: Ability to remain free from injury will improve Outcome: Progressing   Problem: Skin Integrity: Goal: Risk for impaired skin integrity will decrease Outcome: Progressing   Problem: Fluid Volume: Goal: Ability to maintain a balanced intake and output will improve Outcome: Progressing   Problem: Metabolic: Goal: Ability to maintain appropriate glucose levels will improve Outcome: Progressing   Problem: Nutritional: Goal: Maintenance of adequate nutrition will improve Outcome: Progressing   Problem: Skin Integrity: Goal: Risk for impaired skin integrity will decrease Outcome: Progressing   Problem: Tissue Perfusion: Goal: Adequacy of tissue perfusion will improve Outcome: Progressing

## 2023-07-13 NOTE — Progress Notes (Signed)
 Brief Nutrition Note  Consult received for enteral/tube feeding initiation and management.  Adult Enteral Nutrition Protocol initiated. Full assessment to follow.  NG/OG vented lumen tube in place with tip located in below diaphragm per xray imaging.   Admitting Dx: Hyperthyroidism [E05.90] Tachycardia [R00.0] Bilateral pulmonary embolism (HCC) [I26.99]  Body mass index is 24.39 kg/m.   Labs:  Recent Labs  Lab 07/12/23 0427 07/12/23 0440 07/12/23 1600 07/12/23 1604 07/13/23 0041 07/13/23 0403 07/13/23 0409 07/13/23 0416  NA 138   < > 137   < > 140 138 139 140  K 4.6   < > 4.4   < > 3.9 3.9 3.8 3.7  CL 99   < > 97*   < > 90* 93*  --  90*  CO2 25  --  28  --   --  32  --   --   BUN 38*   < > 35*   < > 37* 36*  --  38*  CREATININE 3.03*   < > 3.01*   < > 2.50* 3.09*  --  2.40*  CALCIUM 9.2  --  8.9  --   --  9.5  --   --   MG 1.7  --  2.2  --   --  2.4  --   --   PHOS 5.6*  --  4.5  --   --  2.9  --   --   GLUCOSE 197*   < > 204*   < > 270* 223*  --  263*   < > = values in this interval not displayed.   Vital HP @ 49ml/h  Jamelle Haring RDN, LDN Clinical Dietitian   If unable to reach, please contact "[facility] Inpatient" secure chat group between 8 am-4 pm daily"

## 2023-07-13 NOTE — Progress Notes (Signed)
 PHARMACY - ANTICOAGULATION CONSULT NOTE  Pharmacy Consult for bivalirudin Indication:  ECMO, Impella  No Known Allergies  Patient Measurements: Height: 6\' 4"  (193 cm) Weight: 90.9 kg (200 lb 6.4 oz) IBW/kg (Calculated) : 86.8 Heparin Dosing Weight: n/a  Vital Signs: Temp: 98.1 F (36.7 C) (03/02 0700) Temp Source: Core (03/01 2000) Pulse Rate: 75 (03/02 0700)  Labs: Recent Labs    07/10/23 1224 07/10/23 1310 07/10/23 1425 07/10/23 1428 07/10/23 1708 07/10/23 1709 07/10/23 2238 07/10/23 2302 07/11/23 0404 07/11/23 0407 07/11/23 0512 07/11/23 0540 07/11/23 1854 07/11/23 2024 07/12/23 0427 07/12/23 0440 07/12/23 0657 07/12/23 0758 07/12/23 1155 07/12/23 1158 07/12/23 1600 07/12/23 1604 07/13/23 0041 07/13/23 0403 07/13/23 0409 07/13/23 0416  HGB 10.6*   < > 10.1*   < > 10.0*   < >  --    < > 9.0*   < >  --    < >  --    < > 8.0*   < > 7.8*   < >  --    < > 7.8*   < > 9.9* 8.0* 8.2* 10.5*  HCT 37.0*   < > 33.7*   < > 31.3*   < >  --    < > 28.0*   < >  --    < >  --    < > 23.9*   < > 23.6*   < >  --    < > 23.0*   < > 29.0* 22.9* 24.0* 31.0*  PLT 269  --  199  --  199   < >  --   --  185  --   --    < >  --    < > 96*  --  94*  --   --   --  91*  --   --  88*  --   --   APTT  --   --  134*   < >  --    < > 50*  --   --   --   --    < >  --    < >  --    < >  --   --  62*  --  61*  --   --  60*  --   --   LABPROT 31.8*  --   --   --  42.0*   < >  --   --  45.2*  --   --   --   --   --  28.1*  --   --   --   --   --   --   --   --  18.6*  --   --   INR 3.1*  --   --   --  4.4*   < >  --   --  4.8*  --   --   --   --   --  2.6*  --   --   --   --   --   --   --   --  1.5*  --   --   HEPARINUNFRC <0.10*  --   --   --  0.23*  --  <0.10*  --   --   --   --   --   --   --   --   --   --   --   --   --   --   --   --   --   --   --  CREATININE 1.53*   < > 1.88*   < > 1.70*  --   --    < >  --   --  2.76*   < >  --    < > 3.03*   < >  --    < >  --    < > 3.01*   < > 2.50*  3.09*  --  2.40*  CKTOTAL  --   --   --   --   --   --   --   --   --   --  117  --  357  --   --   --   --   --   --   --   --   --   --   --   --   --   TROPONINIHS 263*  --  597*  --   --   --   --   --   --   --   --   --   --   --   --   --   --   --   --   --   --   --   --   --   --   --    < > = values in this interval not displayed.    Estimated Creatinine Clearance: 51.2 mL/min (A) (by C-G formula based on SCr of 2.4 mg/dL (H)).   Medical History: Past Medical History:  Diagnosis Date   Atrial fibrillation (HCC)    Hyperthyroidism    Mitral regurgitation    NSTEMI (non-ST elevated myocardial infarction) [facility])     Assessment: 38 yo M with bilateral PE s/p TNK (2/27 @1156 ) now on [place] ECMO + Impella. Pharmacy consulted for bivalirudin for anticoagulation.   aPTT remains therapeutic at 60, on bivalirudin 0.03 mg/kg/hr. Hgb 8, Plt 88, LDH >2500, fibrinogen 275. LFTs trending downward - total bilirubin similar at 7.3. No active bleeding. Remains on CRRT - requiring citrate. Minimal  fibrin in ECMO circuit (at 9 o'clock).   Goal of Therapy:  aPTT 50-70 seconds Monitor platelets by anticoagulation protocol: Yes   Plan:  Continue bivalirudin 0.03mg /kg/hr (using dose weight 97.5 kg) aPTT q12h F/u CBC, LDH, fibrinogen Monitor s/s bleeding   Thank you for allowing pharmacy to participate in this patient's care,  Sherron Monday, PharmD, BCCCP Clinical Pharmacist  Phone: [phone] 07/13/2023 7:27 AM  Please check AMION for all [facility] Pharmacy phone numbers After 10:00 PM, call Main Pharmacy [phone]

## 2023-07-13 NOTE — Progress Notes (Signed)
 [facility] Progress Note    Assessment/ Plan:   AKI -Baseline normal creatinine. Likely ATN in the context of cardiac arrest and cardiogenic shock. No obstruction on pan-CT.  -Hyperkalemia refractory to treatment, therefore started on CRRT by her ECMO circuit 2/28.  Keep net even to net -50 cc an hour (as tolerated). A/C: citrate protocol. Bag changes as below -will continue with CRRT -Avoid nephrotoxic medications including NSAIDs and iodinated intravenous contrast exposure unless the latter is absolutely indicated.  Preferred narcotic agents for pain control are hydromorphone, fentanyl, and methadone. Morphine should not be used. Avoid Baclofen and avoid oral sodium phosphate and magnesium citrate based laxatives / bowel preps. Continue strict Input and Output monitoring. Will monitor the patient closely with you and intervene or adjust therapy as indicated by changes in clinical status/labs    Refractory hyperkalemia-resolved Now hypokalemia -CRRT as above, dialysate 4k, changing post to 4k, pre is 2k -monitor serial K   VT+PEA cardiac arrest -VA ECMO+Impella start 2/27. Per primary service+ECMO team   PE -On Angiomax   AHRF/VDRF -Vent Per CCM   Cardiogenic shock -pressor support per CCM   Elevated LFTs -Suspect shock liver, monitor   Anemia -Transfuse as needed   Lactic acidosis -secondary to shock, improved -bicarb support thru CRRT  Discussed with AHF, CCM, and ICU RN. Discussed with fiance at the bedside.   The patient is critically ill with AKI, hyperkalemia, cardiogenic shock, cardiac arrest, severe MR, PE, VDRF, shock liver, acute on chronic anemia and which includes my role to primarily manage AKI, hyperkalemia.  This requires high complexity decision making.  Total critical care time: 31  min  Critical care time was exclusive of treating other patients.   Critical care was necessary to treat or prevent imminent or life-threatening  deterioration.   Critical care was time spent personally by me on the following activities:   development of treatment plan with patient and/or surrogate as well as nursing,   discussions with other provider evaluation of patient's response to treatment  examination of patient  obtaining history from patient or surrogate  ordering and performing treatments and interventions  ordering and review of laboratory studies  ordering and review of radiographic studies   Subjective:   No acute events. Seen on CRRT. Tolerating CRRT Uop tapering off down to around 5cc/hr per ICU RN.    Objective:   BP (!) 86/73   Pulse 76   Temp 98.4 F (36.9 C)   Resp 13   Ht 6\' 4"  (1.93 m)   Wt 90.9 kg   SpO2 99%   BMI 24.39 kg/m   Intake/Output Summary (Last 24 hours) at 07/13/2023 9147 Last data filed at 07/13/2023 0800 Gross per 24 hour  Intake 4606.18 ml  Output 4562.3 ml  Net 43.88 ml   Weight change: 2.2 kg  Physical Exam: Gen: ill appearing, sedated CVS: tachycardic Resp: decreased breath sounds bibasilar, intubated, bl chest expansion Abd: soft Ext: trace edema b/l Les Neuro: sedated Dialysis access: CRRT thru ecmo circuit  Imaging: DG CHEST PORT 1 VIEW Result Date: 07/12/2023 CLINICAL DATA:  Check endotracheal tube placement EXAM: PORTABLE CHEST 1 VIEW COMPARISON:  Film from earlier in the same day. FINDINGS: Endotracheal tube and gastric catheter are noted in satisfactory position. Impella catheter is seen extending into the left ventricle. Swan-Ganz catheter is noted within the right pulmonary artery. Left jugular central venous line is noted at the cavoatrial junction. No pneumothorax is seen. Persistent bibasilar opacities are seen.  No other focal abnormality is noted. IMPRESSION: Tubes and lines in satisfactory position. Bibasilar opacities are again seen stable from the recent exam. Electronically Signed   By: Alcide Clever M.D.   On: 07/12/2023 19:25   DG CHEST PORT 1 VIEW Result  Date: 07/12/2023 CLINICAL DATA:  ECMO patient.  History of pulmonary embolism. EXAM: PORTABLE CHEST 1 VIEW COMPARISON:  Radiographs 07/11/2023 and 07/10/2023.  CT 07/10/2023. FINDINGS: 0542 hours. Two views submitted. Unchanged position of the support system. Tip of the endotracheal tube overlies the mid trachea. Right IJ Swan-Ganz catheter projects over the proximal right pulmonary artery. Enteric tube projects below the diaphragm, tip not visualized. Left IJ central venous catheter extends to the level of the superior cavoatrial junction. The Impella device and ECMO catheter appear unchanged. The heart size and mediastinal contours are stable. The overall pulmonary aeration has improved with residual left greater than right basilar airspace opacities and probable small pleural effusions. No evidence of pneumothorax. The bones appear unchanged. IMPRESSION: 1. Improved pulmonary aeration with residual left greater than right basilar airspace opacities and probable small pleural effusions. 2. Stable support system. Electronically Signed   By: Carey Bullocks M.D.   On: 07/12/2023 09:49   VAS [place] LOWER EXTREMITY ARTERIAL DUPLEX Result Date: 07/11/2023 LOWER EXTREMITY ARTERIAL DUPLEX STUDY Patient Name:  AUGUSTINO SAVASTANO  Date of Exam:   07/11/2023 Medical Rec #: [id number]     Accession #:    [id number] Date of Birth: June 14, 1985     Patient Gender: M Patient Age:   38 years Exam Location:  [facility] Procedure:      VAS [place] LOWER EXTREMITY ARTERIAL DUPLEX Referring Phys: Clearnce Hasten --------------------------------------------------------------------------------  Indications: Peripheral artery disease.  Vascular Interventions: ECMO cannulation 07/10/23. Current ABI:            N/A Limitations: ECMO Comparison Study: None. Performing Technologist: Shona Simpson  Examination Guidelines: A complete evaluation includes B-mode imaging, spectral Doppler, color Doppler, and power Doppler as needed of all accessible  portions of each vessel. Bilateral testing is considered an integral part of a complete examination. Limited examinations for reoccurring indications may be performed as noted.  +----------+--------+-----+--------+----------+--------+ RIGHT     PSV cm/sRatioStenosisWaveform  Comments +----------+--------+-----+--------+----------+--------+ DFA       85                   monophasic         +----------+--------+-----+--------+----------+--------+ SFA Prox  48                   monophasic         +----------+--------+-----+--------+----------+--------+ SFA Mid   73                   monophasic         +----------+--------+-----+--------+----------+--------+ SFA Distal44                   monophasic         +----------+--------+-----+--------+----------+--------+ POP Prox  51                   monophasic         +----------+--------+-----+--------+----------+--------+ POP Distal40                   monophasic         +----------+--------+-----+--------+----------+--------+ ATA Distal25                   monophasic         +----------+--------+-----+--------+----------+--------+  PTA Distal24                   monophasic         +----------+--------+-----+--------+----------+--------+ PERO Mid  31                   monophasic         +----------+--------+-----+--------+----------+--------+  +----------+--------+-----+--------+----------+--------------+ LEFT      PSV cm/sRatioStenosisWaveform  Comments       +----------+--------+-----+--------+----------+--------------+ DFA                                      Not Visualized +----------+--------+-----+--------+----------+--------------+ SFA Prox  35                   monophasic               +----------+--------+-----+--------+----------+--------------+ SFA Mid   20                   monophasic               +----------+--------+-----+--------+----------+--------------+ SFA  Distal20                   monophasic               +----------+--------+-----+--------+----------+--------------+ POP Prox  13                   monophasic               +----------+--------+-----+--------+----------+--------------+ POP Distal14                   monophasic               +----------+--------+-----+--------+----------+--------------+ ATA Distal11                   monophasic               +----------+--------+-----+--------+----------+--------------+ PTA Distal6                    monophasic               +----------+--------+-----+--------+----------+--------------+  Summary: See table(s) above for measurements and observations. Electronically signed by Coral Else MD on 07/11/2023 at 8:44:22 PM.    Final    VAS [place] LOWER EXTREMITY VENOUS (DVT) Result Date: 07/11/2023  Lower Venous DVT Study Patient Name:  FLORA RATZ  Date of Exam:   07/11/2023 Medical Rec #: [id number]     Accession #:    [id number] Date of Birth: Nov 12, 1985     Patient Gender: M Patient Age:   18 years Exam Location:  [facility] Procedure:      VAS [place] LOWER EXTREMITY VENOUS (DVT) Referring Phys: Clearnce Hasten --------------------------------------------------------------------------------  Indications: Pulmonary embolism.  Risk Factors: Confirmed PE Surgery ECMO cannulation 07/10/23. Limitations: Patient positioning & ECMO. Comparison Study: None. Performing Technologist: Shona Simpson  Examination Guidelines: A complete evaluation includes B-mode imaging, spectral Doppler, color Doppler, and power Doppler as needed of all accessible portions of each vessel. Bilateral testing is considered an integral part of a complete examination. Limited examinations for reoccurring indications may be performed as noted. The reflux portion of the exam is performed with the patient in reverse Trendelenburg.  +---------+---------------+---------+-----------+----------+--------------+ RIGHT     CompressibilityPhasicitySpontaneityPropertiesThrombus Aging +---------+---------------+---------+-----------+----------+--------------+ CFV  Not Visualized +---------+---------------+---------+-----------+----------+--------------+ SFJ                                                   Not Visualized +---------+---------------+---------+-----------+----------+--------------+ FV Prox  Full                                                        +---------+---------------+---------+-----------+----------+--------------+ FV Mid   Full                                                        +---------+---------------+---------+-----------+----------+--------------+ FV DistalFull                    Yes                                 +---------+---------------+---------+-----------+----------+--------------+ PFV      Full                                                        +---------+---------------+---------+-----------+----------+--------------+ POP      Full           Yes      Yes                                 +---------+---------------+---------+-----------+----------+--------------+ PTV      Full                                                        +---------+---------------+---------+-----------+----------+--------------+ PERO     Full                                                        +---------+---------------+---------+-----------+----------+--------------+   +---------+---------------+---------+-----------+----------+--------------+ LEFT     CompressibilityPhasicitySpontaneityPropertiesThrombus Aging +---------+---------------+---------+-----------+----------+--------------+ CFV                                                   Not Visualized +---------+---------------+---------+-----------+----------+--------------+ SFJ                                                    Not Visualized +---------+---------------+---------+-----------+----------+--------------+ FV Prox  Full                                                        +---------+---------------+---------+-----------+----------+--------------+ FV Mid   Full                                                        +---------+---------------+---------+-----------+----------+--------------+ FV DistalFull                    Yes                                 +---------+---------------+---------+-----------+----------+--------------+ PFV      Full                                                        +---------+---------------+---------+-----------+----------+--------------+ POP      Full           Yes      Yes                                 +---------+---------------+---------+-----------+----------+--------------+ PTV      Full                                                        +---------+---------------+---------+-----------+----------+--------------+ PERO                                                  Not Visualized +---------+---------------+---------+-----------+----------+--------------+     Summary: BILATERAL: - No evidence of deep vein thrombosis seen in the lower extremities, bilaterally. -No evidence of popliteal cyst, bilaterally.   *See table(s) above for measurements and observations. Electronically signed by Coral Else MD on 07/11/2023 at 8:40:51 PM.    Final    DG CHEST PORT 1 VIEW Result Date: 07/11/2023 CLINICAL DATA:  ECMO. EXAM: PORTABLE CHEST 1 VIEW COMPARISON:  Chest radiograph dated 07/11/2023. FINDINGS: Support line and tube in similar position. Interval lumen of an area of airspace opacity in the right infrahilar region, likely atelectasis. Pneumonia is not excluded. No large pleural effusion. No pneumothorax. Stable cardiac silhouette no acute osseous pathology. IMPRESSION: 1. Support line and tube in similar position. 2. Right infrahilar  atelectasis. Electronically Signed   By: Elgie Collard M.D.   On: 07/11/2023 17:53   EEG adult Result Date: 07/11/2023 Charlsie Quest, MD     07/11/2023 10:25 AM Patient Name: CLARON ROSENCRANS MRN: [id number] Epilepsy Attending: Charlsie Quest Referring Physician/Provider: Romie Minus, MD Date: 07/11/2023 Duration: 21.38 mins Patient history: 38yo M s/p cardia arrest.  EEG to evaluate for seizure Level of alertness: comatose/ lethargic AEDs during EEG study: Versed Technical aspects: This EEG study was done with scalp electrodes positioned according to the 10-20 International system of electrode placement. Electrical activity was reviewed with band pass filter of 1-70Hz , sensitivity of 7 uV/mm, display speed of 63mm/sec with a 60Hz  notched filter applied as appropriate. EEG data were recorded continuously and digitally stored.  Video monitoring was available and reviewed as appropriate. Description: EEG showed continuous generalized 3 to 6 Hz theta-delta slowing admixed with 15 to 18 Hz beta activity distributed symmetrically and diffusely. Hyperventilation and photic stimulation were not performed.   ABNORMALITY - Continuous slow, generalized IMPRESSION: This study is suggestive of moderate diffuse encephalopathy likely related to sedation. No seizures or epileptiform discharges were seen throughout the recording. [employer]    Labs: BMET Recent [employer] 07/10/23 1224 07/10/23 1310 07/10/23 2302 07/11/23 0026 07/11/23 0512 07/11/23 0737 07/11/23 0742 07/11/23 1600 07/11/23 1654 07/12/23 0427 07/12/23 0440 07/12/23 1359 07/12/23 1600 07/12/23 1604 07/12/23 1611 07/12/23 2024 07/12/23 2028 07/12/23 2215 07/13/23 0037 07/13/23 0041 07/13/23 0403 07/13/23 0409 07/13/23 0416 07/13/23 0734  NA 146*   < > 140   < > 140 141   < > 140   < > 138   < > 140 137   < > 140   < > 139 139 138 140 138 139 140 140  K 7.2*   < > 5.7*   < > 7.2* 7.0*   < > 6.2*   < > 4.6   < > 4.0 4.4    < > 4.1   < > 4.1 4.1 4.0 3.9 3.9 3.8 3.7 3.9  CL 108   < > 107  --  106 107  --  104   < > 99   < > 91* 97*  --  90*  --  90*  --   --  90* 93*  --  90*  --   CO2 22   < > 23  --  22 22  --  26  --  25  --   --  28  --   --   --   --   --   --   --  32  --   --   --   GLUCOSE 49*   < > 152*  --  94 82  --  132*   < > 197*   < > 246* 204*  --  248*  --  261*  --   --  270* 223*  --  263*  --   BUN 21*   < > 30*  --  39* 41*  --  46*   < > 38*   < > 37* 35*  --  37*  --  37*  --   --  37* 36*  --  38*  --   CREATININE 1.53*   < > 2.09*  --  2.76* 3.00*  --  3.48*   < > 3.03*   < > 2.30* 3.01*  --  2.30*  --  2.30*  --   --  2.50* 3.09*  --  2.40*  --   CALCIUM 14.8*   < > 8.3*  --  7.9* 7.8*  --  8.5*  --  9.2  --   --  8.9  --   --   --   --   --   --   --  9.5  --   --   --   PHOS 8.3*  --   --   --   --   --   --  5.3*  --  5.6*  --   --  4.5  --   --   --   --   --   --   --  2.9  --   --   --    < > = values in this interval not displayed.   CBC Recent Labs  Lab 07/12/23 0427 07/12/23 0440 07/12/23 0657 07/12/23 0758 07/12/23 1600 07/12/23 1604 07/13/23 0403 07/13/23 0409 07/13/23 0416 07/13/23 0734  WBC 28.7*  --  27.3*  --  29.9*  --  31.1*  --   --   --   HGB 8.0*   < > 7.8*   < > 7.8*   < > 8.0* 8.2* 10.5* 8.2*  HCT 23.9*   < > 23.6*   < > 23.0*   < > 22.9* 24.0* 31.0* 24.0*  MCV 73.3*  --  73.5*  --  74.7*  --  74.8*  --   --   --   PLT 96*  --  94*  --  91*  --  88*  --   --   --    < > = values in this interval not displayed.    Medications:     Chlorhexidine Gluconate Cloth  6 each Topical Daily   docusate  100 mg Per Tube BID   feeding supplement (VITAL HIGH PROTEIN)  1,000 mL Per Tube Q24H   fentaNYL (SUBLIMAZE) injection  50 mcg Intravenous Once   folic acid  1 mg Per Tube Daily   hydrocortisone sod succinate (SOLU-CORTEF) inj  100 mg Intravenous Q8H   insulin aspart  0-20 Units Subcutaneous Q4H   insulin aspart  4 Units Subcutaneous Q4H   methimazole  15 mg  Per Tube Q8H   metoCLOPramide (REGLAN) injection  10 mg Intravenous Q8H   multivitamin  1 tablet Per Tube QHS   mouth rinse  15 mL Mouth Rinse Q2H   pantoprazole (PROTONIX) IV  40 mg Intravenous QHS   polyethylene glycol  17 g Per Tube Daily   sodium bicarbonate  50 mEq Intravenous Once   sodium chloride flush  3 mL Intravenous Q12H   thiamine  100 mg Per Tube Daily   Or   thiamine  100 mg Intravenous Daily      [name], MD [facility] 07/13/2023, 8:38 AM

## 2023-07-13 NOTE — Progress Notes (Signed)
 Advanced Heart Failure Rounding Note  Cardiologist: Christell Constant, MD  Chief Complaint:  Subjective:    Admitted 2/26 with worsening shortness of breath, chest pain, atrial fibrillation with RVR. Decompensated 2/27 with IVF, nodal blockade with subsequent respiratory arrest, ROSC achieved after ~16 minute down time Femoral VA ecmo cannulation 2/27 with Impella CP vent  Lactic acid noramlized, coags improving. During a BM yesterday awoke, became agitated and trying to sit up in bed. Required physical restraints and paralytics. Now sedated and improved.   Tolerating CRRT on the circuit well, high filter pressures to be expected with ECMO circuit.   Bedside echo confirms good impella placement, 4cm from annulus.   Objective:   Weight Range: 90.9 kg Body mass index is 24.39 kg/m.   Vital Signs:   Temp:  [97.9 F (36.6 C)-98.6 F (37 C)] 98.2 F (36.8 C) (03/02 1345) Pulse Rate:  [71-130] 73 (03/02 1345) Resp:  [0-18] 10 (03/02 1345) SpO2:  [95 %-100 %] 100 % (03/02 1345) Arterial Line BP: (81-121)/(66-90) 118/82 (03/02 1345) FiO2 (%):  [50 %] 50 % (03/02 1107) Weight:  [90.9 kg] 90.9 kg (03/02 0600) Last BM Date : 07/13/23  Weight change: Filed Weights   07/11/23 0600 07/12/23 0500 07/13/23 0600  Weight: 95.4 kg 88.7 kg 90.9 kg    Intake/Output:   Intake/Output Summary (Last 24 hours) at 07/13/2023 1355 Last data filed at 07/13/2023 1314 Gross per 24 hour  Intake 4369.22 ml  Output 5001.3 ml  Net -632.08 ml      Physical Exam    General: Sedated, acutely ill-appearing Lungs: Mild bilateral rhonchi CV: Regular, systolic murmur best heard at the apex trace peripheral edema. JVP mildly elevated, right IJ Swan, left IJ triple-lumen, left femoral arterial return cannula with distal perfusion catheter, right femoral venous drainage cannula, right femoral arterial Impella CP Abdomen: Soft, no distention.  Neurologic: Sedated   Telemetry   Sinus  rhythm   Labs    CBC Recent Labs    07/12/23 1600 07/12/23 1604 07/13/23 0403 07/13/23 0409 07/13/23 0958 07/13/23 1311  WBC 29.9*  --  31.1*  --   --   --   HGB 7.8*   < > 8.0*   < > 7.8* 7.5*  HCT 23.0*   < > 22.9*   < > 23.0* 22.0*  MCV 74.7*  --  74.8*  --   --   --   PLT 91*  --  88*  --   --   --    < > = values in this interval not displayed.   Basic Metabolic Panel Recent Labs    16/10/96 1600 07/12/23 1604 07/13/23 0403 07/13/23 0409 07/13/23 0416 07/13/23 0734 07/13/23 0958 07/13/23 1311  NA 137   < > 138   < > 140   < > 139 140  K 4.4   < > 3.9   < > 3.7   < > 3.8 4.0  CL 97*   < > 93*  --  90*  --   --   --   CO2 28  --  32  --   --   --   --   --   GLUCOSE 204*   < > 223*  --  263*  --   --   --   BUN 35*   < > 36*  --  38*  --   --   --   CREATININE 3.01*   < >  3.09*  --  2.40*  --   --   --   CALCIUM 8.9  --  9.5  --   --   --   --   --   MG 2.2  --  2.4  --   --   --   --   --   PHOS 4.5  --  2.9  --   --   --   --   --    < > = values in this interval not displayed.   Liver Function Tests Recent Labs    07/12/23 1600 07/13/23 0403  AST 1,108* 970*  ALT 382* 355*  ALKPHOS 78 74  BILITOT 7.4* 7.3*  PROT 4.4* 4.7*  ALBUMIN 1.9*  1.9* 2.0*   No results for input(s): "LIPASE", "AMYLASE" in the last 72 hours.  Cardiac Enzymes Recent Labs    07/11/23 0512 07/11/23 1854  CKTOTAL 117 357    BNP: BNP (last 3 results) Recent Labs    07/09/23 1934 07/10/23 1224  BNP 703.7* 980.5*    ProBNP (last 3 results) No results for input(s): "PROBNP" in the last 8760 hours.   D-Dimer No results for input(s): "DDIMER" in the last 72 hours.  Hemoglobin A1C No results for input(s): "HGBA1C" in the last 72 hours.  Fasting Lipid Panel Recent Labs    07/11/23 0512  TRIG 42   Thyroid Function Tests Recent Labs    07/11/23 0737 07/12/23 0427 07/13/23 0403  TSH  --    < > <0.010*  T3FREE 4.2  --   --    < > = values in this interval  not displayed.     Medications:     Scheduled Medications:  Chlorhexidine Gluconate Cloth  6 each Topical Daily   docusate  100 mg Per Tube BID   feeding supplement (VITAL HIGH PROTEIN)  1,000 mL Per Tube Q24H   fentaNYL (SUBLIMAZE) injection  50 mcg Intravenous Once   folic acid  1 mg Per Tube Daily   hydrocortisone sod succinate (SOLU-CORTEF) inj  100 mg Intravenous Q8H   insulin aspart  0-20 Units Subcutaneous Q4H   insulin aspart  4 Units Subcutaneous Q4H   methimazole  15 mg Per Tube Q8H   metoCLOPramide (REGLAN) injection  10 mg Intravenous Q8H   multivitamin  1 tablet Per Tube QHS   mouth rinse  15 mL Mouth Rinse Q2H   pantoprazole (PROTONIX) IV  40 mg Intravenous QHS   polyethylene glycol  17 g Per Tube Daily   sodium bicarbonate  50 mEq Intravenous Once   sodium chloride flush  3 mL Intravenous Q12H   thiamine  100 mg Per Tube Daily   Or   thiamine  100 mg Intravenous Daily    Infusions:  albumin human     bivalirudin (ANGIOMAX) 250 mg in sodium chloride 0.9 % 500 mL (0.5 mg/mL) infusion 0.03 mg/kg/hr (07/13/23 1300)   calcium gluconate 20 g in dextrose 5 % 1,000 mL infusion 60 mL/hr at 07/13/23 1300   citrate dextrose 3,000 mL (07/13/23 0656)   dexmedetomidine (PRECEDEX) IV infusion 1.2 mcg/kg/hr (07/13/23 1340)   fentaNYL infusion INTRAVENOUS 200 mcg/hr (07/13/23 1300)   meropenem (MERREM) IV 1 g (07/13/23 1305)   norepinephrine (LEVOPHED) Adult infusion Stopped (07/12/23 0428)   PrismaSol BGK 2/3.5 50 mL/hr at 07/11/23 1039   prismasol BGK 4/2.5 1,500 mL/hr at 07/13/23 1345   prismasol BGK 4/2.5 400 mL/hr at 07/13/23 0920   sodium bicarbonate  25 mEq (Impella PURGE) in dextrose 5 % 1000 mL bag 5.8 mL/hr at 07/13/23 0248   vancomycin Stopped (07/13/23 1012)   vasopressin Stopped (07/13/23 0022)    PRN Medications: acetaminophen **OR** acetaminophen, albumin human, fentaNYL, heparin, midazolam, mouth rinse, rocuronium    Patient Profile   Patient with a  history of Grave's disease, severe primary mitral regurgitation who presented with chest pain and segmental PE. Subsequent progressed to SCAI Stage E shock requiring VA ECMO cannulation on 2/27.   Assessment/Plan   SCAI Stage E Cardiogenic shock - Suspect hypoxic respiratory failure driven with segmental PE on top of severe MR, nodal blockage, and thyrotoxicosis - Down time ~ 20 minutes, good mental status confirmed post code - Impella CP placed for LV vent given severe MR - Lactic acid resolved - Swan indicates adequate unloading, PCWP 17 with v waves to 25 - More pulsatile today, off vasopressors - Turn down ECMO flow to 4-4.2L given evidence of hemolysis - Impella CP at P2, flow at 2.2L/min - Vanc/meropenem for antibiotic prophylaxis, suspect leukocytosis is reactive and in the setting of steroids - Impella 5.5 tomorrow  Severe mitral valve regurgitation - Noted on previous echocardiogram - Suspect contributing to ongoing shock - EcPella for management currently - TCTS consult for consideration of mitral valve repair - Woke up yesterday with purposeful movements, sedated - Bailout option of mitraclip if unable to wean from support - Swan in place  Anemia - Multiple blood transfusions yesterday - Suspect component of hemolysis given bilirubin, LDH, slowly trending downwards - CK and lactic acid reassuring - Decrease ECMO flow as above - Active type and screen  Acute renal failure/hyperkalemia - Appreciate nephrology consult - CRRT set up through ECMO circuit  Thyrotoxicosis - Was not taking medications as they made him feel poorly - Suspect underlying low output heart failure due to mitral valve disease - Continue IV hydrocortisone 100mg  q8 - Continue methimazole 15mg  q8 - Repeat thyroid labs next week - Will need definitive outpatient treatment  Atrial fibrillation - Present on arrival, in the setting of PE and thyroid disease - Currently in sinus tachycardia -  Bivalrudin for anticoagulation  Pulmonary embolism:  - Segmental without right heart strain - Bivalrudin as above  CAD - Prior embolic infarct in the LAD territory with corresponding LGE on CMR - Lifelong anticoagulation  Sedation - Fentanyl, precedex - Versed as needed - Spot EEG without evidence of seizure - Notably minimal alcohol use after discussion with fiance.   Medication concerns reviewed with patient and pharmacy team. Barriers identified: antibiotic dosing, CRRT  Length of Stay: 4  Romie Minus, MD  07/13/2023, 1:55 PM  Advanced Heart Failure Team Pager [phone] (M-F; 7a - 5p)  Please contact [facility] for night-coverage after hours (5p -7a ) and weekends on [email or web address]  CRITICAL CARE Performed by: Romie Minus   Total critical care time: 60 minutes  Critical care time was exclusive of separately billable procedures and treating other patients.  Critical care was necessary to treat or prevent imminent or life-threatening deterioration.  Critical care was time spent personally by me on the following activities: development of treatment plan with patient and/or surrogate as well as nursing, discussions with consultants, evaluation of patient's response to treatment, examination of patient, obtaining history from patient or surrogate, ordering and performing treatments and interventions, ordering and review of laboratory studies, ordering and review of radiographic studies, pulse oximetry and re-evaluation of patient's condition.

## 2023-07-13 NOTE — CV Procedure (Signed)
 ECMO NOTE:   Indication: Cardiogenic shock   Initial cannulation date: 07/10/23   ECMO type: VA ECMO with Impella CP vent   Dual lumen inflow/return cannula:   1) 25 FR multi-stage venous drainage RFV 2) 21 FR arterial return LCFA 3) 6FR L SFA distal perfusion catheter 4) Impella CP via R CFA LV vent    Daily data:   Flow 4.11 L RPM 3200 Sweep  2L Blender at 90%   Impella at P2 2.3L flow   Labs:   ABG    Component Value Date/Time   PHART 7.519 (H) 07/13/2023 1311   PCO2ART 47.6 07/13/2023 1311   PO2ART 115 (H) 07/13/2023 1311   HCO3 38.9 (H) 07/13/2023 1311   TCO2 40 (H) 07/13/2023 1311   ACIDBASEDEF 1.0 07/11/2023 1314   O2SAT 99 07/13/2023 1311    Hgb 8.0 Platelets 88 LDH >2500 PTT 60, discussed dosing with pharmacy Lactic acid 1.3   Plan:  Stabilize with EC-pella support Off vasopressors Plan on 5.5 placement, likely Monday, discussed with CT surgery Turned down ECMO flow given worsening hemolysis markers with improvement in pulse pressure, impella motor current. Goal 6L total flow   Romie Minus, MD  1:59 PM

## 2023-07-13 NOTE — Progress Notes (Signed)
 NAME:  Aaron [name], MRN:  [id number], DOB:  28-May-1985, LOS: 4 ADMISSION DATE:  07/09/2023, CONSULTATION DATE:  07/11/2023 REFERRING MD: Clearnce Hasten, CHIEF COMPLAINT: Status post cardiac arrest  History of Present Illness:  38 year old male with hypothyroidism, paroxysmal A-fib, severe mitral regurgitation who presented to [facility] with chest pain and shortness of breath for few days associated cough, nausea and diarrhea.  In the emergency department he was diagnosed with PE, was started on IV heparin.  He takes beta-blocker, steroid and methimazole for hyperthyroidism.  On 2/27 patient rapidly deteriorated developed V. tach/V-fib cardiac arrest patient was intubated after 40 minutes of CPR, cardiology was consulted patient was placed on VA ECMO and was transferred to [facility]  Pertinent  Medical History   Past Medical History:  Diagnosis Date   Atrial fibrillation [facility])    Hyperthyroidism    Mitral regurgitation    NSTEMI (non-ST elevated myocardial infarction) (HCC)      Significant Hospital Events: Including procedures, antibiotic start and stop dates in addition to other pertinent events     Interim History / Subjective:  Yesterday patient woke up, was agitated and sat up on the bed ET tube had cuff leak, it was 16 Remain afebrile Respiratory culture is growing Staph aureus and staph epi Remain on VA ECMO with 4 L flow and also on Impella at P2 with 2.2 L flow  Off vasopressor support On CRRT with net even fluid balance  Objective   Blood pressure (!) 86/73, pulse 75, temperature 98.4 F (36.9 C), resp. rate 10, height 6\' 4"  (1.93 m), weight 90.9 kg, SpO2 98%. PAP: (27-59)/(6-37) 49/28 CVP:  [7 mmHg-21 mmHg] 13 mmHg CO:  [2.4 L/min-2.6 L/min] 2.6 L/min CI:  [1.1 L/min/m2] 1.1 L/min/m2  Vent Mode: PRVC FiO2 (%):  [50 %] 50 % Set Rate:  [10 bmp] 10 bmp Vt Set:  [690 mL] 690 mL PEEP:  [5 cmH20] 5 cmH20 Plateau Pressure:  [21 cmH20-23 cmH20] 23  cmH20   Intake/Output Summary (Last 24 hours) at 07/13/2023 0852 Last data filed at 07/13/2023 0800 Gross per 24 hour  Intake 4606.18 ml  Output 4562.3 ml  Net 43.88 ml   Filed Weights   07/11/23 0600 07/12/23 0500 07/13/23 0600  Weight: 95.4 kg 88.7 kg 90.9 kg    Examination: General: Crtitically ill-appearing male, orally intubated HEENT: [place]/AT, eyes anicteric.  ETT and OGT in place Neuro: Sedated, not following commands.  Eyes are closed.  Pupils 3 mm bilateral reactive to light Chest: Coarse breath sounds, no wheezes or rhonchi Heart: Regular rate and rhythm, no murmurs or gallops Abdomen: Soft, nondistended, bowel sounds present Skin: No rash Extremities: ECMO cannula and Impella in place  Labs and images reviewed  Resolved Hospital Problem list   Lactic acidosis, resolved Refractory hyperkalemia, improving  Assessment & Plan:  Status post in-hospital V. tach followed by PEA cardiac arrest Paroxysmal A-fib RVR Severe mitral regurgitation Acute biventricular HFrEF with cardiogenic shock on VA ECMO Acute pulmonary emboli status post TNK Acute respiratory failure with hypoxia Hyperthyroidism Acute kidney injury due to ischemic ATN on CRRT Acute respiratory failure with hypoxia Bilateral multifocal pneumonia Hyperphosphatemia/hypocalcemia Shock liver Acute on chronic anemia due to critical illness and hemolysis  VA ECMO flow was decreased to 4L On Impella at P2 with 2.2 L flow, continue bicarb purge with Impella Patient converted to sinus rhythm with heart rate in 70s Closely monitor and supplement electrolytes Advanced heart failure team is following, patient will  eventually need mitral valve surgery once clinically stable Continue CRRT with goal net -50 mL/h Nephrology is following Serum potassium has corrected Monitor PTT, continue bival Continue IV Bival infusion Continue lung protective ventilation VAP prevention bundle in place PAD protocol with Precedex  and fentanyl infusion Continue as needed Versed Yesterday he woke up and followed commands Continue methimazole and stress dose steroid Holding beta-blocker considering cardiogenic shock Plan for Impella 5.5 tomorrow Monitor H&H and transfuse if less than 7 Patient respiratory culture is growing staph aureus and staph epi Antibiotics were broadened to vancomycin and meropenem considering high white count which could be due to steroid therapy Received 2 units PRBCs again yesterday, continue to monitor CBC and transfuse if hemoglobin less than 8 LDH is elevated which is suggestive of hemolysis Closely monitor electrolytes  Best Practice (right click and "Reselect all SmartList Selections" daily)   Diet/type: NPO increase tube feed to goal DVT prophylaxis systemic bival Pressure ulcer(s): N/A GI prophylaxis: PPI Lines: Central line, Arterial Line, and yes and it is still needed.  ECMO cannula in place Foley:  Yes, and it is still needed Code Status:  full code Last date of multidisciplinary goals of care discussion [Per primary team]  Labs   CBC: Recent Labs  Lab 07/11/23 2340 07/12/23 0022 07/12/23 0427 07/12/23 0440 07/12/23 0657 07/12/23 0758 07/12/23 1600 07/12/23 1604 07/13/23 0041 07/13/23 0403 07/13/23 0409 07/13/23 0416 07/13/23 0734  WBC 32.3*  --  28.7*  --  27.3*  --  29.9*  --   --  31.1*  --   --   --   HGB 7.8*   < > 8.0*   < > 7.8*   < > 7.8*   < > 9.9* 8.0* 8.2* 10.5* 8.2*  HCT 24.1*   < > 23.9*   < > 23.6*   < > 23.0*   < > 29.0* 22.9* 24.0* 31.0* 24.0*  MCV 72.4*  --  73.3*  --  73.5*  --  74.7*  --   --  74.8*  --   --   --   PLT 118*  --  96*  --  94*  --  91*  --   --  88*  --   --   --    < > = values in this interval not displayed.    Basic Metabolic Panel: Recent Labs  Lab 07/10/23 1224 07/10/23 1310 07/11/23 0512 07/11/23 0737 07/11/23 0742 07/11/23 1600 07/11/23 1654 07/12/23 0427 07/12/23 0440 07/12/23 1600 07/12/23 1604  07/12/23 1611 07/12/23 2024 07/12/23 2028 07/12/23 2215 07/13/23 0041 07/13/23 0403 07/13/23 0409 07/13/23 0416 07/13/23 0734  NA 146*   < > 140 141   < > 140   < > 138   < > 137   < > 140   < > 139   < > 140 138 139 140 140  K 7.2*   < > 7.2* 7.0*   < > 6.2*   < > 4.6   < > 4.4   < > 4.1   < > 4.1   < > 3.9 3.9 3.8 3.7 3.9  CL 108   < > 106 107  --  104   < > 99   < > 97*  --  90*  --  90*  --  90* 93*  --  90*  --   CO2 22   < > 22 22  --  26  --  25  --  28  --   --   --   --   --   --  32  --   --   --   GLUCOSE 49*   < > 94 82  --  132*   < > 197*   < > 204*  --  248*  --  261*  --  270* 223*  --  263*  --   BUN 21*   < > 39* 41*  --  46*   < > 38*   < > 35*  --  37*  --  37*  --  37* 36*  --  38*  --   CREATININE 1.53*   < > 2.76* 3.00*  --  3.48*   < > 3.03*   < > 3.01*  --  2.30*  --  2.30*  --  2.50* 3.09*  --  2.40*  --   CALCIUM 14.8*   < > 7.9* 7.8*  --  8.5*  --  9.2  --  8.9  --   --   --   --   --   --  9.5  --   --   --   MG 3.3*   < > 1.9 2.1  --   --   --  1.7  --  2.2  --   --   --   --   --   --  2.4  --   --   --   PHOS 8.3*  --   --   --   --  5.3*  --  5.6*  --  4.5  --   --   --   --   --   --  2.9  --   --   --    < > = values in this interval not displayed.   GFR: Estimated Creatinine Clearance: 51.2 mL/min (A) (by C-G formula based on SCr of 2.4 mg/dL (H)). Recent Labs  Lab 07/12/23 0031 07/12/23 0427 07/12/23 0445 07/12/23 0657 07/12/23 1159 07/12/23 1600 07/13/23 0403 07/13/23 0413  WBC  --  28.7*  --  27.3*  --  29.9* 31.1*  --   LATICACIDVEN 3.1*  --  2.4*  --  2.0*  --   --  1.3    Liver Function Tests: Recent Labs  Lab 07/11/23 0512 07/11/23 0737 07/11/23 1600 07/12/23 0427 07/12/23 1600 07/13/23 0403  AST 1,848* 1,894*  --  1,324* 1,108* 970*  ALT 585* 606*  --  472* 382* 355*  ALKPHOS 78 78  --  71 78 74  BILITOT 6.3* 7.0*  --  8.6* 7.4* 7.3*  PROT 5.3* 5.2*  --  4.6* 4.4* 4.7*  ALBUMIN 2.5* 2.5* 2.4* 2.1* 1.9*  1.9* 2.0*    Recent Labs  Lab 07/09/23 1934  LIPASE 23   No results for input(s): "AMMONIA" in the last 168 hours.  ABG    Component Value Date/Time   PHART 7.563 (H) 07/13/2023 0734   PCO2ART 45.3 07/13/2023 0734   PO2ART 72 (L) 07/13/2023 0734   HCO3 40.9 (H) 07/13/2023 0734   TCO2 42 (H) 07/13/2023 0734   ACIDBASEDEF 1.0 07/11/2023 1314   O2SAT 96 07/13/2023 0734     Coagulation Profile: Recent Labs  Lab 07/10/23 1708 07/10/23 2015 07/11/23 0404 07/12/23 0427 07/13/23 0403  INR 4.4* 3.6* 4.8* 2.6* 1.5*    Cardiac Enzymes: Recent Labs  Lab 07/11/23 0512 07/11/23 1854  CKTOTAL 117 357  HbA1C: Hgb A1c MFr Bld  Date/Time Value Ref Range Status  07/10/2023 06:44 AM 4.8 4.8 - 5.6 % Final    Comment:    (NOTE) Pre diabetes:          5.7%-6.4%  Diabetes:              >6.4%  Glycemic control for   <7.0% adults with diabetes   10/20/2021 01:07 AM 4.2 (L) 4.8 - 5.6 % Final    Comment:    (NOTE) Pre diabetes:          5.7%-6.4%  Diabetes:              >6.4%  Glycemic control for   <7.0% adults with diabetes     CBG: Recent Labs  Lab 07/12/23 1602 07/12/23 2027 07/13/23 0041 07/13/23 0407 07/13/23 0732  GLUCAP 204* 186* 217* 229* 190*    Critical care time:     The patient is critically ill due to cardiogenic shock status post VA ECMO.  Critical care was necessary to treat or prevent imminent or life-threatening deterioration.  Critical care was time spent personally by me on the following activities: development of treatment plan with patient and/or surrogate as well as nursing, discussions with consultants, evaluation of patient's response to treatment, examination of patient, obtaining history from patient or surrogate, ordering and performing treatments and interventions, ordering and review of laboratory studies, ordering and review of radiographic studies, pulse oximetry, re-evaluation of patient's condition and participation in multidisciplinary  rounds.   During this encounter critical care time was devoted to patient care services described in this note for 41 minutes.     Cheri Fowler, MD [facility] See Amion for pager If no response to pager, please call [phone] until 7pm After 7pm, Please call E-link [phone]

## 2023-07-13 NOTE — Plan of Care (Signed)
   Problem: Safety: Goal: Ability to remain free from injury will improve Outcome: Progressing

## 2023-07-14 ENCOUNTER — Inpatient Hospital Stay (HOSPITAL_COMMUNITY)

## 2023-07-14 ENCOUNTER — Inpatient Hospital Stay (HOSPITAL_COMMUNITY): Admitting: Anesthesiology

## 2023-07-14 ENCOUNTER — Encounter (HOSPITAL_COMMUNITY)
Admission: EM | Disposition: A | Discharge status: Rehabilitation Facility | Payer: Self-pay | Source: Home / Self Care | Attending: Cardiology

## 2023-07-14 DIAGNOSIS — I5032 Chronic diastolic (congestive) heart failure: Secondary | ICD-10-CM | POA: Diagnosis not present

## 2023-07-14 DIAGNOSIS — E039 Hypothyroidism, unspecified: Secondary | ICD-10-CM

## 2023-07-14 DIAGNOSIS — R570 Cardiogenic shock: Secondary | ICD-10-CM | POA: Diagnosis not present

## 2023-07-14 DIAGNOSIS — J9811 Atelectasis: Secondary | ICD-10-CM | POA: Diagnosis not present

## 2023-07-14 DIAGNOSIS — I4891 Unspecified atrial fibrillation: Secondary | ICD-10-CM | POA: Diagnosis not present

## 2023-07-14 DIAGNOSIS — E875 Hyperkalemia: Secondary | ICD-10-CM | POA: Diagnosis not present

## 2023-07-14 DIAGNOSIS — N170 Acute kidney failure with tubular necrosis: Secondary | ICD-10-CM | POA: Diagnosis not present

## 2023-07-14 DIAGNOSIS — J811 Chronic pulmonary edema: Secondary | ICD-10-CM | POA: Diagnosis not present

## 2023-07-14 DIAGNOSIS — I480 Paroxysmal atrial fibrillation: Secondary | ICD-10-CM | POA: Diagnosis not present

## 2023-07-14 DIAGNOSIS — E0590 Thyrotoxicosis, unspecified without thyrotoxic crisis or storm: Secondary | ICD-10-CM | POA: Diagnosis not present

## 2023-07-14 DIAGNOSIS — I340 Nonrheumatic mitral (valve) insufficiency: Secondary | ICD-10-CM | POA: Diagnosis not present

## 2023-07-14 DIAGNOSIS — N179 Acute kidney failure, unspecified: Secondary | ICD-10-CM

## 2023-07-14 DIAGNOSIS — R0989 Other specified symptoms and signs involving the circulatory and respiratory systems: Secondary | ICD-10-CM | POA: Diagnosis not present

## 2023-07-14 DIAGNOSIS — I219 Acute myocardial infarction, unspecified: Secondary | ICD-10-CM

## 2023-07-14 DIAGNOSIS — Z4682 Encounter for fitting and adjustment of non-vascular catheter: Secondary | ICD-10-CM | POA: Diagnosis not present

## 2023-07-14 DIAGNOSIS — I2699 Other pulmonary embolism without acute cor pulmonale: Secondary | ICD-10-CM | POA: Diagnosis not present

## 2023-07-14 DIAGNOSIS — J984 Other disorders of lung: Secondary | ICD-10-CM | POA: Diagnosis not present

## 2023-07-14 DIAGNOSIS — I2782 Chronic pulmonary embolism: Secondary | ICD-10-CM | POA: Diagnosis not present

## 2023-07-14 DIAGNOSIS — I5043 Acute on chronic combined systolic (congestive) and diastolic (congestive) heart failure: Secondary | ICD-10-CM | POA: Diagnosis not present

## 2023-07-14 DIAGNOSIS — I5021 Acute systolic (congestive) heart failure: Secondary | ICD-10-CM | POA: Diagnosis not present

## 2023-07-14 DIAGNOSIS — R079 Chest pain, unspecified: Secondary | ICD-10-CM | POA: Diagnosis not present

## 2023-07-14 DIAGNOSIS — R918 Other nonspecific abnormal finding of lung field: Secondary | ICD-10-CM | POA: Diagnosis not present

## 2023-07-14 DIAGNOSIS — I462 Cardiac arrest due to underlying cardiac condition: Secondary | ICD-10-CM | POA: Diagnosis not present

## 2023-07-14 DIAGNOSIS — R0602 Shortness of breath: Secondary | ICD-10-CM | POA: Diagnosis not present

## 2023-07-14 DIAGNOSIS — I469 Cardiac arrest, cause unspecified: Secondary | ICD-10-CM | POA: Diagnosis not present

## 2023-07-14 DIAGNOSIS — I110 Hypertensive heart disease with heart failure: Secondary | ICD-10-CM | POA: Diagnosis not present

## 2023-07-14 DIAGNOSIS — I10 Essential (primary) hypertension: Secondary | ICD-10-CM | POA: Diagnosis not present

## 2023-07-14 DIAGNOSIS — J9601 Acute respiratory failure with hypoxia: Secondary | ICD-10-CM | POA: Diagnosis not present

## 2023-07-14 DIAGNOSIS — E872 Acidosis, unspecified: Secondary | ICD-10-CM | POA: Diagnosis not present

## 2023-07-14 HISTORY — PX: REMOVAL OF IMPELLA LEFT VENTRICULAR ASSIST DEVICE: SHX6556

## 2023-07-14 HISTORY — PX: PLACEMENT OF IMPELLA LEFT VENTRICULAR ASSIST DEVICE: SHX6519

## 2023-07-14 LAB — BPAM FFP
Blood Product Expiration Date: 202503022359
Blood Product Expiration Date: 202503032359
Blood Product Expiration Date: 202503032359
Blood Product Expiration Date: 202503032359
ISSUE DATE / TIME: 202502271503
ISSUE DATE / TIME: 202502271503
ISSUE DATE / TIME: 202502271503
ISSUE DATE / TIME: 202502271503
Unit Type and Rh: 6200
Unit Type and Rh: 6200
Unit Type and Rh: 6200
Unit Type and Rh: 6200

## 2023-07-14 LAB — BPAM RBC
Blood Product Expiration Date: 202503132359
Blood Product Expiration Date: 202503132359
Blood Product Expiration Date: 202503132359
Blood Product Expiration Date: 202503142359
Blood Product Expiration Date: 202503152359
Blood Product Expiration Date: 202503182359
Blood Product Expiration Date: 202503182359
Blood Product Expiration Date: 202503182359
Blood Product Expiration Date: 202503182359
Blood Product Expiration Date: 202503182359
Blood Product Expiration Date: 202503202359
Blood Product Expiration Date: 202503212359
ISSUE DATE / TIME: 202502271319
ISSUE DATE / TIME: 202502271319
ISSUE DATE / TIME: 202502271319
ISSUE DATE / TIME: 202502271319
ISSUE DATE / TIME: 202502272049
ISSUE DATE / TIME: 202502281841
ISSUE DATE / TIME: 202503010121
ISSUE DATE / TIME: 202503010925
ISSUE DATE / TIME: 202503011738
ISSUE DATE / TIME: 202503021747
Unit Type and Rh: 5100
Unit Type and Rh: 5100
Unit Type and Rh: 5100
Unit Type and Rh: 5100
Unit Type and Rh: 5100
Unit Type and Rh: 5100
Unit Type and Rh: 5100
Unit Type and Rh: 5100
Unit Type and Rh: 5100
Unit Type and Rh: 5100
Unit Type and Rh: 5100
Unit Type and Rh: 5100

## 2023-07-14 LAB — POCT I-STAT 7, (LYTES, BLD GAS, ICA,H+H)
Acid-Base Excess: 12 mmol/L — ABNORMAL HIGH (ref 0.0–2.0)
Acid-Base Excess: 13 mmol/L — ABNORMAL HIGH (ref 0.0–2.0)
Acid-Base Excess: 13 mmol/L — ABNORMAL HIGH (ref 0.0–2.0)
Acid-Base Excess: 13 mmol/L — ABNORMAL HIGH (ref 0.0–2.0)
Acid-Base Excess: 13 mmol/L — ABNORMAL HIGH (ref 0.0–2.0)
Acid-Base Excess: 13 mmol/L — ABNORMAL HIGH (ref 0.0–2.0)
Acid-Base Excess: 14 mmol/L — ABNORMAL HIGH (ref 0.0–2.0)
Acid-Base Excess: 14 mmol/L — ABNORMAL HIGH (ref 0.0–2.0)
Acid-Base Excess: 15 mmol/L — ABNORMAL HIGH (ref 0.0–2.0)
Acid-Base Excess: 3 mmol/L — ABNORMAL HIGH (ref 0.0–2.0)
Acid-Base Excess: 6 mmol/L — ABNORMAL HIGH (ref 0.0–2.0)
Acid-Base Excess: 9 mmol/L — ABNORMAL HIGH (ref 0.0–2.0)
Bicarbonate: 32.3 mmol/L — ABNORMAL HIGH (ref 20.0–28.0)
Bicarbonate: 34 mmol/L — ABNORMAL HIGH (ref 20.0–28.0)
Bicarbonate: 34.4 mmol/L — ABNORMAL HIGH (ref 20.0–28.0)
Bicarbonate: 37.5 mmol/L — ABNORMAL HIGH (ref 20.0–28.0)
Bicarbonate: 37.6 mmol/L — ABNORMAL HIGH (ref 20.0–28.0)
Bicarbonate: 38.5 mmol/L — ABNORMAL HIGH (ref 20.0–28.0)
Bicarbonate: 38.6 mmol/L — ABNORMAL HIGH (ref 20.0–28.0)
Bicarbonate: 38.9 mmol/L — ABNORMAL HIGH (ref 20.0–28.0)
Bicarbonate: 38.9 mmol/L — ABNORMAL HIGH (ref 20.0–28.0)
Bicarbonate: 39.6 mmol/L — ABNORMAL HIGH (ref 20.0–28.0)
Bicarbonate: 39.9 mmol/L — ABNORMAL HIGH (ref 20.0–28.0)
Bicarbonate: 40.2 mmol/L — ABNORMAL HIGH (ref 20.0–28.0)
Calcium, Ion: 1.03 mmol/L — ABNORMAL LOW (ref 1.15–1.40)
Calcium, Ion: 1.04 mmol/L — ABNORMAL LOW (ref 1.15–1.40)
Calcium, Ion: 1.04 mmol/L — ABNORMAL LOW (ref 1.15–1.40)
Calcium, Ion: 1.04 mmol/L — ABNORMAL LOW (ref 1.15–1.40)
Calcium, Ion: 1.04 mmol/L — ABNORMAL LOW (ref 1.15–1.40)
Calcium, Ion: 1.06 mmol/L — ABNORMAL LOW (ref 1.15–1.40)
Calcium, Ion: 1.07 mmol/L — ABNORMAL LOW (ref 1.15–1.40)
Calcium, Ion: 1.11 mmol/L — ABNORMAL LOW (ref 1.15–1.40)
Calcium, Ion: 1.12 mmol/L — ABNORMAL LOW (ref 1.15–1.40)
Calcium, Ion: 1.14 mmol/L — ABNORMAL LOW (ref 1.15–1.40)
Calcium, Ion: <0.3 mmol/L — CL (ref 1.15–1.40)
Calcium, Ion: <0.3 mmol/L — CL (ref 1.15–1.40)
HCT: 22 % — ABNORMAL LOW (ref 39.0–52.0)
HCT: 23 % — ABNORMAL LOW (ref 39.0–52.0)
HCT: 23 % — ABNORMAL LOW (ref 39.0–52.0)
HCT: 23 % — ABNORMAL LOW (ref 39.0–52.0)
HCT: 24 % — ABNORMAL LOW (ref 39.0–52.0)
HCT: 24 % — ABNORMAL LOW (ref 39.0–52.0)
HCT: 25 % — ABNORMAL LOW (ref 39.0–52.0)
HCT: 25 % — ABNORMAL LOW (ref 39.0–52.0)
HCT: 25 % — ABNORMAL LOW (ref 39.0–52.0)
HCT: 25 % — ABNORMAL LOW (ref 39.0–52.0)
HCT: 25 % — ABNORMAL LOW (ref 39.0–52.0)
HCT: 31 % — ABNORMAL LOW (ref 39.0–52.0)
Hemoglobin: 10.5 g/dL — ABNORMAL LOW (ref 13.0–17.0)
Hemoglobin: 7.5 g/dL — ABNORMAL LOW (ref 13.0–17.0)
Hemoglobin: 7.8 g/dL — ABNORMAL LOW (ref 13.0–17.0)
Hemoglobin: 7.8 g/dL — ABNORMAL LOW (ref 13.0–17.0)
Hemoglobin: 7.8 g/dL — ABNORMAL LOW (ref 13.0–17.0)
Hemoglobin: 8.2 g/dL — ABNORMAL LOW (ref 13.0–17.0)
Hemoglobin: 8.2 g/dL — ABNORMAL LOW (ref 13.0–17.0)
Hemoglobin: 8.5 g/dL — ABNORMAL LOW (ref 13.0–17.0)
Hemoglobin: 8.5 g/dL — ABNORMAL LOW (ref 13.0–17.0)
Hemoglobin: 8.5 g/dL — ABNORMAL LOW (ref 13.0–17.0)
Hemoglobin: 8.5 g/dL — ABNORMAL LOW (ref 13.0–17.0)
Hemoglobin: 8.5 g/dL — ABNORMAL LOW (ref 13.0–17.0)
O2 Saturation: 100 %
O2 Saturation: 100 %
O2 Saturation: 83 %
O2 Saturation: 88 %
O2 Saturation: 90 %
O2 Saturation: 91 %
O2 Saturation: 94 %
O2 Saturation: 94 %
O2 Saturation: 96 %
O2 Saturation: 96 %
O2 Saturation: 96 %
O2 Saturation: 98 %
Patient temperature: 36.1
Patient temperature: 36.1
Patient temperature: 36.2
Patient temperature: 36.2
Patient temperature: 36.2
Patient temperature: 36.63
Patient temperature: 36.7
Patient temperature: 36.7
Patient temperature: 36.7
Patient temperature: 36.7
Patient temperature: 36.8
Patient temperature: 36.9
Potassium: 3.2 mmol/L — ABNORMAL LOW (ref 3.5–5.1)
Potassium: 3.2 mmol/L — ABNORMAL LOW (ref 3.5–5.1)
Potassium: 3.2 mmol/L — ABNORMAL LOW (ref 3.5–5.1)
Potassium: 3.3 mmol/L — ABNORMAL LOW (ref 3.5–5.1)
Potassium: 3.4 mmol/L — ABNORMAL LOW (ref 3.5–5.1)
Potassium: 3.6 mmol/L (ref 3.5–5.1)
Potassium: 3.6 mmol/L (ref 3.5–5.1)
Potassium: 3.6 mmol/L (ref 3.5–5.1)
Potassium: 3.8 mmol/L (ref 3.5–5.1)
Potassium: 3.9 mmol/L (ref 3.5–5.1)
Potassium: 4 mmol/L (ref 3.5–5.1)
Potassium: 4.1 mmol/L (ref 3.5–5.1)
Sodium: 138 mmol/L (ref 135–145)
Sodium: 138 mmol/L (ref 135–145)
Sodium: 138 mmol/L (ref 135–145)
Sodium: 138 mmol/L (ref 135–145)
Sodium: 138 mmol/L (ref 135–145)
Sodium: 138 mmol/L (ref 135–145)
Sodium: 138 mmol/L (ref 135–145)
Sodium: 139 mmol/L (ref 135–145)
Sodium: 139 mmol/L (ref 135–145)
Sodium: 140 mmol/L (ref 135–145)
Sodium: 140 mmol/L (ref 135–145)
Sodium: 140 mmol/L (ref 135–145)
TCO2: 35 mmol/L — ABNORMAL HIGH (ref 22–32)
TCO2: 36 mmol/L — ABNORMAL HIGH (ref 22–32)
TCO2: 36 mmol/L — ABNORMAL HIGH (ref 22–32)
TCO2: 39 mmol/L — ABNORMAL HIGH (ref 22–32)
TCO2: 39 mmol/L — ABNORMAL HIGH (ref 22–32)
TCO2: 40 mmol/L — ABNORMAL HIGH (ref 22–32)
TCO2: 40 mmol/L — ABNORMAL HIGH (ref 22–32)
TCO2: 41 mmol/L — ABNORMAL HIGH (ref 22–32)
TCO2: 41 mmol/L — ABNORMAL HIGH (ref 22–32)
TCO2: 41 mmol/L — ABNORMAL HIGH (ref 22–32)
TCO2: 42 mmol/L — ABNORMAL HIGH (ref 22–32)
TCO2: 42 mmol/L — ABNORMAL HIGH (ref 22–32)
pCO2 arterial: 49.5 mmHg — ABNORMAL HIGH (ref 32–48)
pCO2 arterial: 51.5 mmHg — ABNORMAL HIGH (ref 32–48)
pCO2 arterial: 53.9 mmHg — ABNORMAL HIGH (ref 32–48)
pCO2 arterial: 54.3 mmHg — ABNORMAL HIGH (ref 32–48)
pCO2 arterial: 55.2 mmHg — ABNORMAL HIGH (ref 32–48)
pCO2 arterial: 55.3 mmHg — ABNORMAL HIGH (ref 32–48)
pCO2 arterial: 55.4 mmHg — ABNORMAL HIGH (ref 32–48)
pCO2 arterial: 55.5 mmHg — ABNORMAL HIGH (ref 32–48)
pCO2 arterial: 55.6 mmHg — ABNORMAL HIGH (ref 32–48)
pCO2 arterial: 56 mmHg — ABNORMAL HIGH (ref 32–48)
pCO2 arterial: 67.5 mmHg (ref 32–48)
pCO2 arterial: 74.2 mmHg (ref 32–48)
pH, Arterial: 7.245 — ABNORMAL LOW (ref 7.35–7.45)
pH, Arterial: 7.306 — ABNORMAL LOW (ref 7.35–7.45)
pH, Arterial: 7.431 (ref 7.35–7.45)
pH, Arterial: 7.446 (ref 7.35–7.45)
pH, Arterial: 7.446 (ref 7.35–7.45)
pH, Arterial: 7.447 (ref 7.35–7.45)
pH, Arterial: 7.449 (ref 7.35–7.45)
pH, Arterial: 7.462 — ABNORMAL HIGH (ref 7.35–7.45)
pH, Arterial: 7.462 — ABNORMAL HIGH (ref 7.35–7.45)
pH, Arterial: 7.463 — ABNORMAL HIGH (ref 7.35–7.45)
pH, Arterial: 7.468 — ABNORMAL HIGH (ref 7.35–7.45)
pH, Arterial: 7.487 — ABNORMAL HIGH (ref 7.35–7.45)
pO2, Arterial: 100 mmHg (ref 83–108)
pO2, Arterial: 45 mmHg — ABNORMAL LOW (ref 83–108)
pO2, Arterial: 456 mmHg — ABNORMAL HIGH (ref 83–108)
pO2, Arterial: 478 mmHg — ABNORMAL HIGH (ref 83–108)
pO2, Arterial: 51 mmHg — ABNORMAL LOW (ref 83–108)
pO2, Arterial: 54 mmHg — ABNORMAL LOW (ref 83–108)
pO2, Arterial: 61 mmHg — ABNORMAL LOW (ref 83–108)
pO2, Arterial: 66 mmHg — ABNORMAL LOW (ref 83–108)
pO2, Arterial: 67 mmHg — ABNORMAL LOW (ref 83–108)
pO2, Arterial: 77 mmHg — ABNORMAL LOW (ref 83–108)
pO2, Arterial: 79 mmHg — ABNORMAL LOW (ref 83–108)
pO2, Arterial: 79 mmHg — ABNORMAL LOW (ref 83–108)

## 2023-07-14 LAB — TYPE AND SCREEN
ABO/RH(D): O POS
Antibody Screen: NEGATIVE
Unit division: 0
Unit division: 0
Unit division: 0
Unit division: 0
Unit division: 0
Unit division: 0
Unit division: 0
Unit division: 0
Unit division: 0
Unit division: 0
Unit division: 0
Unit division: 0

## 2023-07-14 LAB — POCT I-STAT, CHEM 8
BUN: 36 mg/dL — ABNORMAL HIGH (ref 6–20)
BUN: 43 mg/dL — ABNORMAL HIGH (ref 6–20)
Calcium, Ion: 0.32 mmol/L — CL (ref 1.15–1.40)
Calcium, Ion: 0.36 mmol/L — CL (ref 1.15–1.40)
Chloride: 87 mmol/L — ABNORMAL LOW (ref 98–111)
Chloride: 88 mmol/L — ABNORMAL LOW (ref 98–111)
Creatinine, Ser: 2.5 mg/dL — ABNORMAL HIGH (ref 0.61–1.24)
Creatinine, Ser: 2.6 mg/dL — ABNORMAL HIGH (ref 0.61–1.24)
Glucose, Bld: 244 mg/dL — ABNORMAL HIGH (ref 70–99)
Glucose, Bld: 247 mg/dL — ABNORMAL HIGH (ref 70–99)
HCT: 26 % — ABNORMAL LOW (ref 39.0–52.0)
HCT: 29 % — ABNORMAL LOW (ref 39.0–52.0)
Hemoglobin: 8.8 g/dL — ABNORMAL LOW (ref 13.0–17.0)
Hemoglobin: 9.9 g/dL — ABNORMAL LOW (ref 13.0–17.0)
Potassium: 3.7 mmol/L (ref 3.5–5.1)
Potassium: 3.8 mmol/L (ref 3.5–5.1)
Sodium: 140 mmol/L (ref 135–145)
Sodium: 140 mmol/L (ref 135–145)
TCO2: 34 mmol/L — ABNORMAL HIGH (ref 22–32)
TCO2: 35 mmol/L — ABNORMAL HIGH (ref 22–32)

## 2023-07-14 LAB — CBC
HCT: 20.1 % — ABNORMAL LOW (ref 39.0–52.0)
HCT: 22 % — ABNORMAL LOW (ref 39.0–52.0)
HCT: 23.8 % — ABNORMAL LOW (ref 39.0–52.0)
Hemoglobin: 7.2 g/dL — ABNORMAL LOW (ref 13.0–17.0)
Hemoglobin: 7.9 g/dL — ABNORMAL LOW (ref 13.0–17.0)
Hemoglobin: 8.4 g/dL — ABNORMAL LOW (ref 13.0–17.0)
MCH: 27.8 pg (ref 26.0–34.0)
MCH: 28.4 pg (ref 26.0–34.0)
MCH: 28 pg (ref 26.0–34.0)
MCHC: 35.8 g/dL (ref 30.0–36.0)
MCHC: 35.9 g/dL (ref 30.0–36.0)
MCHC: 35.3 g/dL (ref 30.0–36.0)
MCV: 77.6 fL — ABNORMAL LOW (ref 80.0–100.0)
MCV: 79.1 fL — ABNORMAL LOW (ref 80.0–100.0)
MCV: 79.3 fL — ABNORMAL LOW (ref 80.0–100.0)
Platelets: 87 10*3/uL — ABNORMAL LOW (ref 150–400)
Platelets: 78 10*3/uL — ABNORMAL LOW (ref 150–400)
Platelets: 82 10*3/uL — ABNORMAL LOW (ref 150–400)
RBC: 2.59 MIL/uL — ABNORMAL LOW (ref 4.22–5.81)
RBC: 2.78 MIL/uL — ABNORMAL LOW (ref 4.22–5.81)
RBC: 3 MIL/uL — ABNORMAL LOW (ref 4.22–5.81)
RDW: 19.6 % — ABNORMAL HIGH (ref 11.5–15.5)
RDW: 18.8 % — ABNORMAL HIGH (ref 11.5–15.5)
RDW: 18.8 % — ABNORMAL HIGH (ref 11.5–15.5)
WBC: 32.2 10*3/uL — ABNORMAL HIGH (ref 4.0–10.5)
WBC: 27.4 10*3/uL — ABNORMAL HIGH (ref 4.0–10.5)
WBC: 24 10*3/uL — ABNORMAL HIGH (ref 4.0–10.5)
nRBC: 1.7 % — ABNORMAL HIGH (ref 0.0–0.2)
nRBC: 0.9 % — ABNORMAL HIGH (ref 0.0–0.2)
nRBC: 1.3 % — ABNORMAL HIGH (ref 0.0–0.2)

## 2023-07-14 LAB — GLUCOSE, CAPILLARY
Glucose-Capillary: 164 mg/dL — ABNORMAL HIGH (ref 70–99)
Glucose-Capillary: 177 mg/dL — ABNORMAL HIGH (ref 70–99)
Glucose-Capillary: 177 mg/dL — ABNORMAL HIGH (ref 70–99)
Glucose-Capillary: 186 mg/dL — ABNORMAL HIGH (ref 70–99)
Glucose-Capillary: 187 mg/dL — ABNORMAL HIGH (ref 70–99)
Glucose-Capillary: 201 mg/dL — ABNORMAL HIGH (ref 70–99)

## 2023-07-14 LAB — HEPATIC FUNCTION PANEL
ALT: 224 U/L — ABNORMAL HIGH (ref 0–44)
AST: 767 U/L — ABNORMAL HIGH (ref 15–41)
Albumin: 2.2 g/dL — ABNORMAL LOW (ref 3.5–5.0)
Alkaline Phosphatase: 68 U/L (ref 38–126)
Bilirubin, Direct: 1.9 mg/dL — ABNORMAL HIGH (ref 0.0–0.2)
Indirect Bilirubin: 6 mg/dL — ABNORMAL HIGH (ref 0.3–0.9)
Total Bilirubin: 7.9 mg/dL — ABNORMAL HIGH (ref 0.0–1.2)

## 2023-07-14 LAB — PREPARE FRESH FROZEN PLASMA
Unit division: 0
Unit division: 0

## 2023-07-14 LAB — PROTIME-INR
INR: 1.4 — ABNORMAL HIGH (ref 0.8–1.2)
Prothrombin Time: 17.1 s — ABNORMAL HIGH (ref 11.4–15.2)

## 2023-07-14 LAB — LACTATE DEHYDROGENASE: LDH: >2500 U/L — ABNORMAL HIGH (ref 98–192)

## 2023-07-14 LAB — BASIC METABOLIC PANEL
Anion gap: 11 (ref 5–15)
Anion gap: 11 (ref 5–15)
BUN: 46 mg/dL — ABNORMAL HIGH (ref 6–20)
BUN: 57 mg/dL — ABNORMAL HIGH (ref 6–20)
CO2: 34 mmol/L — ABNORMAL HIGH (ref 22–32)
CO2: 33 mmol/L — ABNORMAL HIGH (ref 22–32)
Calcium: 8.9 mg/dL (ref 8.9–10.3)
Calcium: 8.5 mg/dL — ABNORMAL LOW (ref 8.9–10.3)
Chloride: 92 mmol/L — ABNORMAL LOW (ref 98–111)
Chloride: 94 mmol/L — ABNORMAL LOW (ref 98–111)
Creatinine, Ser: 3.26 mg/dL — ABNORMAL HIGH (ref 0.61–1.24)
Creatinine, Ser: 3.62 mg/dL — ABNORMAL HIGH (ref 0.61–1.24)
GFR, Estimated: 24 mL/min — ABNORMAL LOW (ref >60)
GFR, Estimated: 21 mL/min — ABNORMAL LOW (ref >60)
Glucose, Bld: 219 mg/dL — ABNORMAL HIGH (ref 70–99)
Glucose, Bld: 181 mg/dL — ABNORMAL HIGH (ref 70–99)
Potassium: 4 mmol/L (ref 3.5–5.1)
Potassium: 3.1 mmol/L — ABNORMAL LOW (ref 3.5–5.1)
Sodium: 137 mmol/L (ref 135–145)
Sodium: 138 mmol/L (ref 135–145)

## 2023-07-14 LAB — RENAL FUNCTION PANEL
Albumin: 1.9 g/dL — ABNORMAL LOW (ref 3.5–5.0)
Anion gap: 12 (ref 5–15)
BUN: 55 mg/dL — ABNORMAL HIGH (ref 6–20)
CO2: 35 mmol/L — ABNORMAL HIGH (ref 22–32)
Calcium: 8.5 mg/dL — ABNORMAL LOW (ref 8.9–10.3)
Chloride: 95 mmol/L — ABNORMAL LOW (ref 98–111)
Creatinine, Ser: 3.65 mg/dL — ABNORMAL HIGH (ref 0.61–1.24)
GFR, Estimated: 21 mL/min — ABNORMAL LOW (ref >60)
Glucose, Bld: 165 mg/dL — ABNORMAL HIGH (ref 70–99)
Phosphorus: 3.2 mg/dL (ref 2.5–4.6)
Potassium: 3.8 mmol/L (ref 3.5–5.1)
Sodium: 142 mmol/L (ref 135–145)

## 2023-07-14 LAB — FIBRINOGEN: Fibrinogen: 308 mg/dL (ref 210–475)

## 2023-07-14 LAB — PREPARE RBC (CROSSMATCH)

## 2023-07-14 LAB — ECHO INTRAOPERATIVE TEE
Height: 76 in
Weight: 3054.69 [oz_av]

## 2023-07-14 LAB — MAGNESIUM: Magnesium: 2.2 mg/dL (ref 1.7–2.4)

## 2023-07-14 LAB — ECHO TEE
Height: 76 in
Weight: 3054.69 [oz_av]

## 2023-07-14 LAB — TSH: TSH: <0.01 u[IU]/mL — ABNORMAL LOW (ref 0.350–4.500)

## 2023-07-14 LAB — APTT
aPTT: 58 s — ABNORMAL HIGH (ref 24–36)
aPTT: 42 s — ABNORMAL HIGH (ref 24–36)

## 2023-07-14 LAB — T4, FREE: Free T4: 0.54 ng/dL — ABNORMAL LOW (ref 0.61–1.12)

## 2023-07-14 LAB — CG4 I-STAT (LACTIC ACID): Lactic Acid, Venous: 0.9 mmol/L (ref 0.5–1.9)

## 2023-07-14 LAB — PHOSPHORUS: Phosphorus: 2.5 mg/dL (ref 2.5–4.6)

## 2023-07-14 SURGERY — INSERTION, CARDIAC ASSIST DEVICE, IMPELLA
Anesthesia: General | Site: Groin | Laterality: Right

## 2023-07-14 MED ORDER — HEPARIN 6000 UNIT IRRIGATION SOLUTION
Status: DC | PRN
  Administered 2023-07-14: 1

## 2023-07-14 MED ORDER — HYDROCORTISONE SOD SUC (PF) 100 MG IJ SOLR
100.0000 mg | Freq: Two times a day (BID) | INTRAMUSCULAR | Status: DC
  Administered 2023-07-14: 100 mg via INTRAVENOUS
  Filled 2023-07-14: qty 2

## 2023-07-14 MED ORDER — HEMOSTATIC AGENTS (NO CHARGE) OPTIME
TOPICAL | Status: DC | PRN
  Administered 2023-07-14 (×2): 1 via TOPICAL

## 2023-07-14 MED ORDER — BANATROL TF EN LIQD
60.0000 mL | Freq: Two times a day (BID) | ENTERAL | Status: DC
  Administered 2023-07-14 – 2023-07-17 (×6): 60 mL
  Filled 2023-07-14 (×6): qty 60

## 2023-07-14 MED ORDER — CLONAZEPAM 1 MG PO TABS
1.0000 mg | ORAL_TABLET | Freq: Two times a day (BID) | ORAL | Status: DC
  Administered 2023-07-14 – 2023-07-15 (×4): 1 mg
  Filled 2023-07-14 (×4): qty 1

## 2023-07-14 MED ORDER — METHIMAZOLE 10 MG PO TABS
10.0000 mg | ORAL_TABLET | Freq: Three times a day (TID) | ORAL | Status: DC
  Administered 2023-07-14 – 2023-07-16 (×6): 10 mg
  Filled 2023-07-14 (×9): qty 1

## 2023-07-14 MED ORDER — POTASSIUM CHLORIDE 10 MEQ/50ML IV SOLN
10.0000 meq | INTRAVENOUS | Status: AC
  Administered 2023-07-14 (×2): 10 meq via INTRAVENOUS
  Filled 2023-07-14 (×2): qty 50

## 2023-07-14 MED ORDER — PROSOURCE TF20 ENFIT COMPATIBL EN LIQD
60.0000 mL | Freq: Three times a day (TID) | ENTERAL | Status: DC
  Administered 2023-07-14 – 2023-07-22 (×23): 60 mL
  Filled 2023-07-14 (×24): qty 60

## 2023-07-14 MED ORDER — ROCURONIUM BROMIDE 10 MG/ML (PF) SYRINGE
PREFILLED_SYRINGE | INTRAVENOUS | Status: DC | PRN
  Administered 2023-07-14 (×2): 50 mg via INTRAVENOUS
  Administered 2023-07-14: 100 mg via INTRAVENOUS

## 2023-07-14 MED ORDER — 0.9 % SODIUM CHLORIDE (POUR BTL) OPTIME
TOPICAL | Status: DC | PRN
  Administered 2023-07-14: 4000 mL

## 2023-07-14 MED ORDER — ROCURONIUM BROMIDE 10 MG/ML (PF) SYRINGE
PREFILLED_SYRINGE | INTRAVENOUS | Status: AC
  Filled 2023-07-14: qty 10

## 2023-07-14 MED ORDER — SODIUM CHLORIDE 0.9% IV SOLUTION: Freq: Once | INTRAVENOUS | Status: AC

## 2023-07-14 MED ORDER — NICARDIPINE HCL IN NACL 40-0.83 MG/200ML-% IV SOLN
0.0000 mg/h | INTRAVENOUS | Status: DC
  Administered 2023-07-14: 5 mg/h via INTRAVENOUS
  Administered 2023-07-15: 2.5 mg/h via INTRAVENOUS
  Filled 2023-07-14 (×2): qty 200

## 2023-07-14 MED ORDER — SODIUM BICARBONATE 8.4 % IV SOLN
INTRAVENOUS | Status: DC | PRN
  Administered 2023-07-14: 25 meq

## 2023-07-14 MED ORDER — QUETIAPINE FUMARATE 50 MG PO TABS
50.0000 mg | ORAL_TABLET | Freq: Two times a day (BID) | ORAL | Status: DC
  Administered 2023-07-14 – 2023-07-15 (×4): 50 mg
  Filled 2023-07-14 (×4): qty 1

## 2023-07-14 MED ORDER — POTASSIUM CHLORIDE 20 MEQ PO PACK
40.0000 meq | PACK | Freq: Two times a day (BID) | ORAL | Status: DC
  Administered 2023-07-14 – 2023-07-15 (×3): 40 meq
  Filled 2023-07-14 (×3): qty 2

## 2023-07-14 MED ORDER — FENTANYL CITRATE (PF) 250 MCG/5ML IJ SOLN
INTRAMUSCULAR | Status: AC
Start: 2023-07-14 — End: ?
  Filled 2023-07-14: qty 5

## 2023-07-14 MED ORDER — PIVOT 1.5 CAL PO LIQD
1000.0000 mL | ORAL | Status: DC
  Administered 2023-07-14 – 2023-07-22 (×10): 1000 mL
  Filled 2023-07-14 (×6): qty 1000

## 2023-07-14 MED ORDER — OXYCODONE HCL 5 MG PO TABS
5.0000 mg | ORAL_TABLET | Freq: Four times a day (QID) | ORAL | Status: DC
  Administered 2023-07-14 – 2023-07-16 (×8): 5 mg
  Filled 2023-07-14 (×8): qty 1

## 2023-07-14 MED ORDER — LACTATED RINGERS IV SOLN: INTRAVENOUS | Status: DC | PRN

## 2023-07-14 MED ORDER — INSULIN ASPART 100 UNIT/ML IJ SOLN
5.0000 [IU] | INTRAMUSCULAR | Status: DC
  Administered 2023-07-14 – 2023-07-22 (×40): 5 [IU] via SUBCUTANEOUS

## 2023-07-14 MED ORDER — PRISMASOL BGK 2/3.5 32-2-3.5 MEQ/L EC SOLN: Status: DC

## 2023-07-14 MED ORDER — MILRINONE LACTATE IN DEXTROSE 20-5 MG/100ML-% IV SOLN: 0.2500 ug/kg/min | INTRAVENOUS | Status: DC

## 2023-07-14 MED ORDER — HYDROCORTISONE SOD SUC (PF) 100 MG IJ SOLR: 50.0000 mg | Freq: Three times a day (TID) | INTRAMUSCULAR | Status: DC

## 2023-07-14 MED ORDER — PRISMASOL BGK 4/2.5 32-4-2.5 MEQ/L EC SOLN: Status: DC

## 2023-07-14 MED ORDER — SODIUM CHLORIDE 0.9% IV SOLUTION: Freq: Once | INTRAVENOUS | Status: DC

## 2023-07-14 MED ORDER — CLEVIDIPINE BUTYRATE 0.5 MG/ML IV EMUL
INTRAVENOUS | Status: DC | PRN
  Administered 2023-07-14: 2 mg/h via INTRAVENOUS

## 2023-07-14 MED ORDER — MIDAZOLAM HCL 2 MG/2ML IJ SOLN
INTRAMUSCULAR | Status: AC
  Filled 2023-07-14: qty 2

## 2023-07-14 MED ORDER — MILRINONE LACTATE IN DEXTROSE 20-5 MG/100ML-% IV SOLN
INTRAVENOUS | Status: DC | PRN
  Administered 2023-07-14: .25 ug/kg/min via INTRAVENOUS

## 2023-07-14 SURGICAL SUPPLY — 52 items
ANCHOR CATH FOLEY SECURE (MISCELLANEOUS) ×6 IMPLANT
APPLICATOR TIP COSEAL (VASCULAR PRODUCTS) IMPLANT
BLADE CLIPPER SURG (BLADE) ×2 IMPLANT
CATH DIAG EXPO 6F AL1 (CATHETERS) ×2 IMPLANT
CATH INFINITI 5FR ANG PIGTAIL (CATHETERS) IMPLANT
CATH INFINITI 6F MPB2 (CATHETERS) ×2 IMPLANT
CLIP LIGATING EXTRA MED SLVR (CLIP) ×2 IMPLANT
CLOSURE PERCLOSE PROSTYLE (VASCULAR PRODUCTS) IMPLANT
CONTAINER PROTECT SURGISLUSH (MISCELLANEOUS) ×2 IMPLANT
DRAPE C-ARM 42X72 X-RAY (DRAPES) ×4 IMPLANT
DRAPE CV SPLIT W-CLR ANES SCRN (DRAPES) ×2 IMPLANT
DRAPE PERI GROIN 82X75IN TIB (DRAPES) ×2 IMPLANT
DRAPE WARM FLUID 44X44 (DRAPES) ×2 IMPLANT
DRSG AQUACEL AG ADV 3.5X 6 (GAUZE/BANDAGES/DRESSINGS) IMPLANT
DRSG TEGADERM 4X4.5 CHG (GAUZE/BANDAGES/DRESSINGS) IMPLANT
FELT TEFLON 1X6 (MISCELLANEOUS) ×2 IMPLANT
GAUZE 4X4 16PLY ~~LOC~~+RFID DBL (SPONGE) ×2 IMPLANT
GLOVE SS BIOGEL STRL SZ 7.5 (GLOVE) ×2 IMPLANT
GOWN STRL REUS W/ TWL LRG LVL3 (GOWN DISPOSABLE) ×8 IMPLANT
GOWN STRL REUS W/ TWL XL LVL3 (GOWN DISPOSABLE) ×2 IMPLANT
GRAFT VASC STRG 30X10STRL (Graft) ×2 IMPLANT
HEMOSTAT SURGICEL 2X14 (HEMOSTASIS) IMPLANT
INSERT FOGARTY SM (MISCELLANEOUS) ×2 IMPLANT
KIT BASIN OR (CUSTOM PROCEDURE TRAY) ×2 IMPLANT
LIGACLIP SM TITANIUM (CLIP) ×2 IMPLANT
LOOP VASCLR EXTRA MAXI WHITE (MISCELLANEOUS) IMPLANT
LOOPS VASCLR EXTRA MAXI WHITE (MISCELLANEOUS) ×2 IMPLANT
NS IRRIG 1000ML POUR BTL (IV SOLUTION) ×8 IMPLANT
PACK CHEST (CUSTOM PROCEDURE TRAY) ×2 IMPLANT
PACK E OPEN HEART (SUTURE) IMPLANT
PAD ARMBOARD 7.5X6 YLW CONV (MISCELLANEOUS) ×4 IMPLANT
PAD ELECT DEFIB RADIOL ZOLL (MISCELLANEOUS) ×2 IMPLANT
PUMP SET IMPELLA 5.5 US (CATHETERS) ×2 IMPLANT
SEALANT SURG COSEAL 8ML (VASCULAR PRODUCTS) ×2 IMPLANT
SPONGE T-LAP 18X18 ~~LOC~~+RFID (SPONGE) ×8 IMPLANT
SPONGE T-LAP 4X18 ~~LOC~~+RFID (SPONGE) ×2 IMPLANT
STAPLER VISISTAT 35W (STAPLE) IMPLANT
SUT ETHILON 3 0 PS 1 (SUTURE) ×4 IMPLANT
SUT PDS AB 1 CTX 36 (SUTURE) ×2 IMPLANT
SUT PROLENE 4 0 SH DA (SUTURE) IMPLANT
SUT PROLENE 5 0 C 1 36 (SUTURE) ×4 IMPLANT
SUT SILK 1 MH (SUTURE) ×8 IMPLANT
SUT SILK 1 TIES 10X30 (SUTURE) ×2 IMPLANT
SUT VIC AB 1 CTX36XBRD ANBCTR (SUTURE) IMPLANT
SUT VIC AB 2-0 CT1 TAPERPNT 27 (SUTURE) IMPLANT
TIE VASCULAR EXTRA MAXI WHITE (MISCELLANEOUS) ×2 IMPLANT
TOWEL GREEN STERILE (TOWEL DISPOSABLE) ×2 IMPLANT
TOWEL GREEN STERILE FF (TOWEL DISPOSABLE) ×2 IMPLANT
TRAY FOLEY SLVR 16FR TEMP STAT (SET/KITS/TRAYS/PACK) ×2 IMPLANT
VASCULAR TIE MINI RED 18IN STL (MISCELLANEOUS) ×2 IMPLANT
WATER STERILE IRR 1000ML POUR (IV SOLUTION) ×4 IMPLANT
WIRE EMERALD 3MM-J .035X260CM (WIRE) IMPLANT

## 2023-07-14 NOTE — Progress Notes (Signed)
 RT NOTE RT transported patient on ventilator from OR to room 2H22 with no complications.

## 2023-07-14 NOTE — Anesthesia Postprocedure Evaluation (Signed)
 Anesthesia Post Note  Patient: Aaron [name]  Procedure(s) Performed: PLACEMENT OF RIGHT AXILLARY IMPELLA 5.5 LEFT VENTRICULAR ASSIST DEVICE (Right: Axilla) REMOVAL OF CP RIGHT FEMORAL IMPELLA LEFT VENTRICULAR ASSIST DEVICE (Right: Groin)     Patient location during evaluation: SICU Anesthesia Type: General Level of consciousness: sedated Pain management: pain level controlled Vital Signs Assessment: post-procedure vital signs reviewed and stable Respiratory status: patient remains intubated per anesthesia plan Cardiovascular status: stable Postop Assessment: no apparent nausea or vomiting Anesthetic complications: no   No notable events documented.  Last Vitals:  Vitals:   07/14/23 1030 07/14/23 1045  BP:    Pulse: 80 74  Resp: (!) 9 11  Temp:    SpO2: 100% 100%    Last Pain:  Vitals:   07/14/23 1028  TempSrc: Core  PainSc:                  Nelle Don Wadie Mattie

## 2023-07-14 NOTE — Progress Notes (Signed)
 NAME:  Aaron [name], MRN:  [id number], DOB:  03-26-1986, LOS: 5 ADMISSION DATE:  07/09/2023, CONSULTATION DATE:  07/11/2023 REFERRING MD: Clearnce Hasten, CHIEF COMPLAINT: Status post cardiac arrest  History of Present Illness:  38 year old male with hypothyroidism, paroxysmal A-fib, severe mitral regurgitation who presented to [facility] hospital with chest pain and shortness of breath for few days associated cough, nausea and diarrhea.  In the emergency department he was diagnosed with PE, was started on IV heparin.  He takes beta-blocker, steroid and methimazole for hyperthyroidism.  On 2/27 patient rapidly deteriorated developed V. tach/V-fib cardiac arrest patient was intubated after 40 minutes of CPR, cardiology was consulted patient was placed on [facility] ECMO and was transferred to [facility]  Pertinent  Medical History   Past Medical History:  Diagnosis Date   Atrial fibrillation [facility])    Hyperthyroidism    Mitral regurgitation    NSTEMI (non-ST elevated myocardial infarction) (HCC)      Significant Hospital Events: Including procedures, antibiotic start and stop dates in addition to other pertinent events     Interim History / Subjective:  Hemoglobin dropped again to 7.2 this morning, received 1 unit PRBC Remained afebrile Remain off vasopressors On [place] ECMO with 4 L flow and Impella at P2 with 2.2 L flow  Objective   Blood pressure (!) 86/73, pulse 81, temperature 98.6 F (37 C), resp. rate 10, height 6\' 4"  (1.93 m), weight 86.6 kg, SpO2 100%. PAP: (40-55)/(23-34) 54/34 CVP:  [9 mmHg-19 mmHg] 14 mmHg CO:  [2 L/min-4.6 L/min] 4.6 L/min CI:  [1.6 L/min/m2-4.6 L/min/m2] 2.02 L/min/m2  Vent Mode: PRVC FiO2 (%):  [40 %-50 %] 40 % Set Rate:  [10 bmp] 10 bmp Vt Set:  [610 mL-690 mL] 610 mL PEEP:  [5 cmH20] 5 cmH20 Plateau Pressure:  [24 cmH20] 24 cmH20   Intake/Output Summary (Last 24 hours) at 07/14/2023 0827 Last data filed at 07/14/2023 0800 Gross per 24 hour   Intake 5441.22 ml  Output 6060.9 ml  Net -619.68 ml   Filed Weights   07/12/23 0500 07/13/23 0600 07/14/23 0500  Weight: 88.7 kg 90.9 kg 86.6 kg    Examination: General: Crtitically ill-appearing male, orally intubated HEENT: [place]/AT, eyes anicteric.  ETT and OGT in place Neuro: Sedated, not following commands.  Eyes are closed.  Pupils 3 mm bilateral reactive to light Chest: Coarse breath sounds, no wheezes or rhonchi Heart: Regular rate and rhythm, pan systolic murmur best at mitral area Abdomen: Soft, nondistended, bowel sounds present Skin: Multiple tattoo marks noted on chest and bilateral upper extremities Extremities: ECMO cannula and Impella insertion is in groin, area looks clean and dry  Labs and images reviewed  Resolved Hospital Problem list   Lactic acidosis, resolved Refractory hyperkalemia, improving  Assessment & Plan:  Status post in-hospital V. tach followed by PEA cardiac arrest Paroxysmal A-fib RVR Severe mitral regurgitation Acute biventricular HFrEF with cardiogenic shock on VA ECMO Acute pulmonary emboli status post TNK Acute respiratory failure with hypoxia Hyperthyroidism Acute kidney injury due to ischemic ATN on CRRT Acute respiratory failure with hypoxia Bilateral multifocal pneumonia Hyperphosphatemia/hypocalcemia Shock liver Acute on chronic anemia due to critical illness and hemolysis Thrombocytopenia due to critical illness  Continue VA ECMO, currently at 4 L flow, tolerating well.  No issues On Impella at P2 with 2.2 L flow Plan to switch to 5 5 Impella today  Continue bicarb purge with Impella Remain in sinus rhythm Closely monitor and supplement electrolytes Advanced heart failure  team is following, patient will eventually need mitral valve surgery once clinically stable Continue CRRT with goal net -50 mL/h as his filling pressures are high with CVP of 14 Nephrology is following Monitor PTT, continue bival Continue IV Bival  infusion Continue lung protective ventilation VAP prevention bundle in place PAD protocol with Precedex and fentanyl infusion.  Increase fentanyl to 400 mics per hour As frequently requiring Versed as needed Decrease methimazole to 10 mg Will decrease stress dose steroids to 50 mg 3 times daily Continue methimazole and stress dose steroid Holding beta-blocker considering cardiogenic shock Monitor H&H and transfuse if less than 8 Received 1 unit PRBC, repeat CBC is pending Patient respiratory culture is growing staph aureus and staph epi Continue antibiotics with vancomycin and meropenem Follow-up respiratory culture sensitivity results LDH remain elevated with elevated indirect bilirubin which is suggestive of hemolysis Monitor platelet count, currently in 80s  Best Practice (right click and "Reselect all SmartList Selections" daily)   Diet/type: NPO TF DVT prophylaxis systemic bival Pressure ulcer(s): N/A GI prophylaxis: PPI Lines: Central line, Arterial Line, and yes and it is still needed.  ECMO cannula in place Foley:  Yes, and it is still needed Code Status:  full code Last date of multidisciplinary goals of care discussion [Per primary team]  Labs   CBC: Recent Labs  Lab 07/12/23 0657 07/12/23 0758 07/12/23 1600 07/12/23 1604 07/13/23 0403 07/13/23 0409 07/13/23 1607 07/13/23 1608 07/14/23 0017 07/14/23 0259 07/14/23 0327 07/14/23 0732 07/14/23 0814  WBC 27.3*  --  29.9*  --  31.1*  --  32.1*  --   --   --  32.2*  --   --   HGB 7.8*   < > 7.8*   < > 8.0*   < > 7.4*   < > 9.9* 7.5* 7.2* 8.5* 8.8*  HCT 23.6*   < > 23.0*   < > 22.9*   < > 20.6*   < > 29.0* 22.0* 20.1* 25.0* 26.0*  MCV 73.5*  --  74.7*  --  74.8*  --  76.9*  --   --   --  77.6*  --   --   PLT 94*  --  91*  --  88*  --  86*  --   --   --  87*  --   --    < > = values in this interval not displayed.    Basic Metabolic Panel: Recent Labs  Lab 07/11/23 0737 07/11/23 0742 07/11/23 1600  07/11/23 1654 07/12/23 0427 07/12/23 0440 07/12/23 1600 07/12/23 1604 07/13/23 0403 07/13/23 0409 07/13/23 1607 07/13/23 1608 07/13/23 1635 07/13/23 1805 07/14/23 0017 07/14/23 0259 07/14/23 0327 07/14/23 0732 07/14/23 0814  NA 141   < > 140   < > 138   < > 137   < > 138   < > 141 138 141   < > 140 139 137 138 140  K 7.0*   < > 6.2*   < > 4.6   < > 4.4   < > 3.9   < > 4.0 4.0 3.8   < > 3.7 4.0 4.0 3.8 3.8  CL 107  --  104   < > 99   < > 97*   < > 93*   < > 94* 92* 88*  --  87*  --  92*  --  88*  CO2 22  --  26  --  25  --  28  --  32  --  34*  --   --   --   --   --  34*  --   --   GLUCOSE 82  --  132*   < > 197*   < > 204*   < > 223*   < > 168* 160* 232*  --  247*  --  219*  --  244*  BUN 41*  --  46*   < > 38*   < > 35*   < > 36*   < > 37* 39* 33*  --  36*  --  46*  --  43*  CREATININE 3.00*  --  3.48*   < > 3.03*   < > 3.01*   < > 3.09*   < > 3.20* 3.60* 2.40*  --  2.50*  --  3.26*  --  2.60*  CALCIUM 7.8*  --  8.5*  --  9.2  --  8.9  --  9.5  --  9.3  --   --   --   --   --  8.9  --   --   MG 2.1  --   --   --  1.7  --  2.2  --  2.4  --   --   --   --   --   --   --  2.2  --   --   PHOS  --   --  5.3*  --  5.6*  --  4.5  --  2.9  --   --   --   --   --   --   --  2.5  --   --    < > = values in this interval not displayed.   GFR: Estimated Creatinine Clearance: 47.2 mL/min (A) (by C-G formula based on SCr of 2.6 mg/dL (H)). Recent Labs  Lab 07/12/23 0031 07/12/23 0427 07/12/23 0445 07/12/23 0657 07/12/23 1159 07/12/23 1600 07/13/23 0403 07/13/23 0413 07/13/23 1607 07/14/23 0327  WBC  --    < >  --    < >  --  29.9* 31.1*  --  32.1* 32.2*  LATICACIDVEN 3.1*  --  2.4*  --  2.0*  --   --  1.3  --   --    < > = values in this interval not displayed.    Liver Function Tests: Recent Labs  Lab 07/11/23 0737 07/11/23 1600 07/12/23 0427 07/12/23 1600 07/13/23 0403 07/14/23 0327  AST 1,894*  --  1,324* 1,108* 970* 767*  ALT 606*  --  472* 382* 355* 224*  ALKPHOS  78  --  71 78 74 68  BILITOT 7.0*  --  8.6* 7.4* 7.3* 7.9*  PROT 5.2*  --  4.6* 4.4* 4.7* NOT CALCULATED  ALBUMIN 2.5* 2.4* 2.1* 1.9*  1.9* 2.0* 2.2*   Recent Labs  Lab 07/09/23 1934  LIPASE 23   No results for input(s): "AMMONIA" in the last 168 hours.  ABG    Component Value Date/Time   PHART 7.468 (H) 07/14/2023 0732   PCO2ART 55.5 (H) 07/14/2023 0732   PO2ART 100 07/14/2023 0732   HCO3 40.2 (H) 07/14/2023 0732   TCO2 35 (H) 07/14/2023 0814   ACIDBASEDEF 1.0 07/11/2023 1314   O2SAT 98 07/14/2023 0732     Coagulation Profile: Recent Labs  Lab 07/10/23 2015 07/11/23 0404 07/12/23 0427 07/13/23 0403 07/14/23 0327  INR 3.6* 4.8* 2.6* 1.5* 1.4*    Cardiac  Enzymes: Recent Labs  Lab 07/11/23 0512 07/11/23 1854  CKTOTAL 117 357    HbA1C: Hgb A1c MFr Bld  Date/Time Value Ref Range Status  07/10/2023 06:44 AM 4.8 4.8 - 5.6 % Final    Comment:    (NOTE) Pre diabetes:          5.7%-6.4%  Diabetes:              >6.4%  Glycemic control for   <7.0% adults with diabetes   10/20/2021 01:07 AM 4.2 (L) 4.8 - 5.6 % Final    Comment:    (NOTE) Pre diabetes:          5.7%-6.4%  Diabetes:              >6.4%  Glycemic control for   <7.0% adults with diabetes     CBG: Recent Labs  Lab 07/13/23 1115 07/13/23 1601 07/13/23 2113 07/14/23 0012 07/14/23 0331  GLUCAP 166* 149* 152* 186* 201*    Critical care time:     The patient is critically ill due to cardiogenic shock status post VA ECMO.  Critical care was necessary to treat or prevent imminent or life-threatening deterioration.  Critical care was time spent personally by me on the following activities: development of treatment plan with patient and/or surrogate as well as nursing, discussions with consultants, evaluation of patient's response to treatment, examination of patient, obtaining history from patient or surrogate, ordering and performing treatments and interventions, ordering and review of  laboratory studies, ordering and review of radiographic studies, pulse oximetry, re-evaluation of patient's condition and participation in multidisciplinary rounds.   During this encounter critical care time was devoted to patient care services described in this note for 40 minutes.     Cheri Fowler, MD [facility] See Amion for pager If no response to pager, please call [phone] until 7pm After 7pm, Please call E-link [phone]

## 2023-07-14 NOTE — Anesthesia Procedure Notes (Signed)
 Arterial Line Insertion Start/End3/07/2023 11:01 AM, 07/14/2023 11:03 AM Performed by: Atilano Median, DO, anesthesiologist  Patient location: OR. Preanesthetic checklist: patient identified, IV checked, surgical consent, monitors and equipment checked, pre-op evaluation, timeout performed and anesthesia consent Left, radial was placed Catheter size: 20 G Hand hygiene performed  and maximum sterile barriers used   Attempts: 1 Procedure performed without using ultrasound guided technique. Following insertion, dressing applied and Biopatch. Post procedure assessment: normal and unchanged  Patient tolerated the procedure well with no immediate complications.

## 2023-07-14 NOTE — Brief Op Note (Addendum)
 07/14/2023  1:21 PM  PATIENT:  Aaron [name]  38 y.o. male  PRE-OPERATIVE DIAGNOSIS:  Acute on chronic left ventricular heart failure  POST-OPERATIVE DIAGNOSIS:  Acute on chronic left ventricular heart failure  PROCEDURE:  Procedure(s): PLACEMENT OF RIGHT AXILLARY IMPELLA 5.5 LEFT VENTRICULAR ASSIST DEVICE (Right) REMOVAL OF CP RIGHT FEMORAL IMPELLA LEFT VENTRICULAR ASSIST DEVICE (Right)  SURGEON:  Surgeons and Role:    * Loreli Slot, MD - Primary  SURGEON REMOVAL of FEMORAL IMPELLA    Tonny Bollman, MD - Assisting    * Elwyn Lade, Jamse Arn, MD - Assisting  PHYSICIAN ASSISTANT: WAYNE GOLD PA-C  ASSISTANTS: Dorathy Daft HAYES RNFA   ANESTHESIA:   general  EBL:  50 mL   BLOOD ADMINISTERED:NONE  DRAINS: none   LOCAL MEDICATIONS USED:  NONE  SPECIMEN:  No Specimen  DISPOSITION OF SPECIMEN:  N/A  COUNTS:  YES  TOURNIQUET:  * No tourniquets in log *  DICTATION: .Other Dictation: Dictation Number PENDING  PLAN OF CARE: Admit to inpatient   PATIENT DISPOSITION:   INTUBATED IN SERIOUS CONDITION   Delay start of Pharmacological VTE agent (>24hrs) due to surgical blood loss or risk of bleeding: ANTICOAGULATION PER IMPELLA GUIDELINES

## 2023-07-14 NOTE — Op Note (Signed)
 Aaron [name], [name] MEDICAL RECORD NO: [id number] ACCOUNT NO: [id number] DATE OF BIRTH: [birth year] FACILITY: [facility] LOCATION: [facility] PHYSICIAN: Salvatore Decent. 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  PROCEDURE:  Right axillary cannulation for placement of Impella 5.5 left ventricular assist device, removal of right femoral Impella CP left ventricular assist device.  SURGEON:  Salvatore Decent. Dorris Fetch, MD  ASSISTANT:  Gershon Crane, PA.  CARDIOLOGISTS:  Tonny Bollman, MD. and Clearnce Hasten, MD.  Experienced assistance was necessary for this case due to surgical complexity.  Gershon Crane assisted with retraction, exposure, suctioning, suture management and wound closure.  ANESTHESIA: General.  FINDINGS:  Normal axillary artery.  Good position of the Impella 5.5. Good hemostasis after removal of the femoral Impella.  CLINICAL NOTE:  Aaron [name] is a 38 year old gentleman with known mitral regurgitation.  He was admitted with chest pain and shortness of breath and ruled in for pulmonary embolism. He was anticoagulated. He suffered a ventricular tachycardia/ventricular fibrillation arrest and required prolonged CPR. He was placed on [place] ECMO and an Impella CP was placed via the femoral artery to decompress the left ventricle.  The situation has now stabilized and the plan is to place an Impella 5.5 to assist with weaning from [place] ECMO and hopefully, to allow for mobilization.  The indications, risks, benefits, and alternatives were discussed in detail with the patient's fiancee and his sister.  His sister gave verbal consent to proceed.  OPERATIVE NOTE:  Aaron [name] was brought to the operating room on 07/14/2023.  He was on  ECMO with an Impella CP device in place.  He was already intubated and had a Swan Ganz catheter in place. He was already receiving intravenous vancomycin and meropenem on schedule.  He was transferred to the operating table.  He had induction of general anesthesia.  The chest, abdomen, and groins were prepped and draped in the usual sterile fashion.  A timeout was performed. An incision was made in the right deltopectoral groove.  The deltopectoral fat pad was identified.  The dissection was begun in the fat pad. The brachial plexus was identified.  No traction was placed on it.  The axillary artery was identified. It was dissected out and proximal and distal control was obtained.  The bivalrudin drip was restarted.  The axillary artery then was clamped proximally and distally.  An arteriotomy was made and a HemoShield Platinum graft was sewn end-to-side to the  axillary artery with a running 5-0 Prolene suture.  At the completion of the anastomosis, the distal clamp was removed first to de-air the graft and then the proximal clamp was removed as well.  Additional sutures were placed around the graft to control any bleeding sites. Surgicel was placed around the anastomosis. The peel-away sheath was placed into the graft and secured with clamps.  A wire then was advanced into the left ventricle using fluoroscopy and transesophageal echocardiogram. After confirming ventricular placement, the pigtail catheter was advanced into the left ventricle. A stiffer wire then was advanced into the left ventricle and up along the septum. A wire was placed through the Impella device, which was then inserted and  advanced into the left ventricle. Once in place, the wire was removed and flow was initiated on the new Impella device as the  Impella CP was pulled back into the descending aorta.  Ultimately, there was good positioning of the device, both by fluoroscopy and by transesophageal echo.  The graft was cut to length.  The  sheath was advanced into the graft and secured with 3-0 silk sutures and the Impella was secured into place. The sheath was sutured to the chest wall at the incision with 0 silk sutures.   The Impella CP was removed from the groin by Dr. Excell Seltzer and Dr. Elwyn Lade.  There was good hemostasis at that site and pressure was held at that site for 20 minutes.    Hemostasis was achieved. The wound was copiously irrigated with saline.  It was closed in 3 layers.  The patient remained on P4 with 1.4 L flow. There was good pulsatility with equal pulse pressures on the right and left arms.  All sponge, needle, and instrument counts were correct at the end of the procedure.  The patient was transported from the operating room to the surgical intensive care unit, intubated and in good condition.  The total fluoroscopy time was 227 seconds and the total dose was 46.34 mGy.   [name] D: 07/14/2023 3:12:44 pm T: 07/14/2023 11:13:00 pm  JOB: [id number]/ [id number]

## 2023-07-14 NOTE — Progress Notes (Signed)
  Echocardiogram Echocardiogram Transesophageal has been performed.  Delcie Roch 07/14/2023, 12:50 PM

## 2023-07-14 NOTE — Procedures (Signed)
 Cortrak  Tube Type:  Cortrak - 43 inches Tube Location:  Right nare Initial Placement:  Stomach Secured by: Bridle Technique Used to Measure Tube Placement:  Marking at nare/corner of mouth Cortrak Secured At:  77 cm   Cortrak Tube Team Note:  Consult received to place a Cortrak feeding tube.   No x-ray is required. RN may begin using tube.   If the tube becomes dislodged please keep the tube and contact the Cortrak team at [email or web address] for replacement.  If after hours and replacement cannot be delayed, place a NG tube and confirm placement with an abdominal x-ray.    Betsey Holiday MS, RD, LDN If unable to be reached, please send secure chat to "RD inpatient" available from 8:00a-4:00p daily

## 2023-07-14 NOTE — CV Procedure (Signed)
 ECMO NOTE:   Indication: Cardiogenic shock   Initial cannulation date: 07/10/23   ECMO type: VA ECMO with Impella CP vent   Dual lumen inflow/return cannula:   1) 25 FR multi-stage venous drainage RFV 2) 21 FR arterial return LCFA 3) 6FR L SFA distal perfusion catheter 4) Impella CP via R CFA LV vent    Daily data:   Flow 4.0L RPM 3400 Sweep  2L Blender at 90%   Impella at P2 2.2L flow   Labs:   ABG    Component Value Date/Time   PHART 7.462 (H) 07/14/2023 0259   PCO2ART 55.6 (H) 07/14/2023 0259   PO2ART 77 (L) 07/14/2023 0259   HCO3 39.9 (H) 07/14/2023 0259   TCO2 42 (H) 07/14/2023 0259   ACIDBASEDEF 1.0 07/11/2023 1314   O2SAT 96 07/14/2023 0259    Hgb 7.2 Platelets 87 LDH >2500 PTT 58 discussed dosing with pharmacy Lactic acid pending   Plan:  - Plan for Impella 5.5 placement today.  - Increase volume removal.    Ulyses Panico, DO  8:08 AM

## 2023-07-14 NOTE — Procedures (Signed)
 Admit: 07/09/2023 LOS: 5  5M AKI likely ATN after SCA, cardiogenic shock on ECMO and Impella, AHRF/VDRF  Current CRRT Prescription: Start Date: 07/11/23 Catheter: Using ECMO circuit BFR: 150 Pre Blood Pump: Citrate DFR: 1500 4K Replacement Rate: 400 4K Goal UF: -48mL/h Anticoagulation: citrate Clotting: none  S: To OR today for Impella Remains on ECMO Fiance at bedside, updated  O: 03/02 0701 - 03/03 0700 In: 5438.1 [I.V.:2832.7; Blood:686; NG/GT:1025; IV Piggyback:760.4] Out: 6268.9 [Urine:15; Stool:935]  Filed Weights   07/12/23 0500 07/13/23 0600 07/14/23 0500  Weight: 88.7 kg 90.9 kg 86.6 kg    Recent Labs  Lab 07/12/23 1600 07/12/23 1604 07/13/23 0403 07/13/23 0409 07/13/23 1607 07/13/23 1608 07/13/23 1635 07/13/23 1805 07/14/23 0017 07/14/23 0259 07/14/23 0327  NA 137   < > 138   < > 141   < > 141   < > 140 139 137  K 4.4   < > 3.9   < > 4.0   < > 3.8   < > 3.7 4.0 4.0  CL 97*   < > 93*   < > 94*   < > 88*  --  87*  --  92*  CO2 28  --  32  --  34*  --   --   --   --   --  34*  GLUCOSE 204*   < > 223*   < > 168*   < > 232*  --  247*  --  219*  BUN 35*   < > 36*   < > 37*   < > 33*  --  36*  --  46*  CREATININE 3.01*   < > 3.09*   < > 3.20*   < > 2.40*  --  2.50*  --  3.26*  CALCIUM 8.9  --  9.5  --  9.3  --   --   --   --   --  8.9  PHOS 4.5  --  2.9  --   --   --   --   --   --   --  2.5   < > = values in this interval not displayed.   Recent Labs  Lab 07/13/23 0403 07/13/23 0409 07/13/23 1607 07/13/23 1608 07/14/23 0017 07/14/23 0259 07/14/23 0327  WBC 31.1*  --  32.1*  --   --   --  32.2*  HGB 8.0*   < > 7.4*   < > 9.9* 7.5* 7.2*  HCT 22.9*   < > 20.6*   < > 29.0* 22.0* 20.1*  MCV 74.8*  --  76.9*  --   --   --  77.6*  PLT 88*  --  86*  --   --   --  87*   < > = values in this interval not displayed.    Scheduled Meds:  sodium chloride   Intravenous Once   Chlorhexidine Gluconate Cloth  6 each Topical Daily   clonazePAM  1 mg Per Tube  BID   docusate  100 mg Per Tube BID   fentaNYL (SUBLIMAZE) injection  50 mcg Intravenous Once   folic acid  1 mg Per Tube Daily   hydrocortisone sod succinate (SOLU-CORTEF) inj  100 mg Intravenous Q8H   insulin aspart  0-20 Units Subcutaneous Q4H   insulin aspart  4 Units Subcutaneous Q4H   methimazole  15 mg Per Tube Q8H   metoCLOPramide (REGLAN) injection  10 mg Intravenous Q8H  multivitamin  1 tablet Per Tube QHS   mouth rinse  15 mL Mouth Rinse Q2H   oxyCODONE  5 mg Per Tube Q6H   pantoprazole (PROTONIX) IV  40 mg Intravenous QHS   polyethylene glycol  17 g Per Tube Daily   QUEtiapine  50 mg Per Tube BID   sodium bicarbonate  50 mEq Intravenous Once   sodium chloride flush  3 mL Intravenous Q12H   thiamine  100 mg Per Tube Daily   Or   thiamine  100 mg Intravenous Daily   Continuous Infusions:  albumin human Stopped (07/14/23 0301)   bivalirudin (ANGIOMAX) 250 mg in sodium chloride 0.9 % 500 mL (0.5 mg/mL) infusion 0.03 mg/kg/hr (07/14/23 0800)   calcium gluconate 20 g in dextrose 5 % 1,000 mL infusion 60 mL/hr at 07/14/23 0800   citrate dextrose 3,000 mL (07/14/23 0017)   dexmedetomidine (PRECEDEX) IV infusion 1.2 mcg/kg/hr (07/14/23 0800)   fentaNYL infusion INTRAVENOUS 200 mcg/hr (07/14/23 0800)   meropenem (MERREM) IV Stopped (07/14/23 0541)   norepinephrine (LEVOPHED) Adult infusion Stopped (07/12/23 0428)   PrismaSol BGK 2/3.5 50 mL/hr at 07/11/23 1039   prismasol BGK 4/2.5 1,500 mL/hr at 07/14/23 0805   prismasol BGK 4/2.5 400 mL/hr at 07/13/23 2155   sodium bicarbonate 25 mEq (Impella PURGE) in dextrose 5 % 1000 mL bag 5.8 mL/hr at 07/13/23 0248   vancomycin Stopped (07/13/23 1012)   vasopressin Stopped (07/13/23 0022)   PRN Meds:.acetaminophen **OR** acetaminophen, albumin human, fentaNYL, heparin, midazolam, mouth rinse, rocuronium  ABG    Component Value Date/Time   PHART 7.462 (H) 07/14/2023 0259   PCO2ART 55.6 (H) 07/14/2023 0259   PO2ART 77 (L)  07/14/2023 0259   HCO3 39.9 (H) 07/14/2023 0259   TCO2 42 (H) 07/14/2023 0259   ACIDBASEDEF 1.0 07/11/2023 1314   O2SAT 96 07/14/2023 0259   Inbutaed, sedated Cont hum  Coarse bs Sedated Ill appearing  A/P  Dialysis dependent anuric AKI 2/2 ATN from #2 and #3 on CRRT Cardiogenic Shock on ECMO + Impella VT + PEA SCA  PE on angiomax per CCM Anemia, Hb stable, per AHF Hyperkalemia,resolved wit hCRRT  Cont CRRT at current settings, holding citrate and Ca while in OR today.    D/w CCM MD and ICU RN.    Sabra Heck, MD [facility] pgr [phone]

## 2023-07-14 NOTE — Progress Notes (Signed)
 Initial Nutrition Assessment  DOCUMENTATION CODES:   Not applicable  INTERVENTION:   Reverse trendelenburg >10 degrees while TF infusing until able to maintain HOB >30 degrees  Tube Feeding via Cortrak: Change formula to Pivot 1.5 with goal 65 ml/hr Begin TF at rate of 35 ml/hr, titrate by 10 mL q 8 hours until goal of 65 ml/hr Add Pro-Source TF20 60 mL TID TF at goal provides 2580 kcals, 206 g of protein, 1186 mL of free water  Continue Renal MVI   Almost 1L stool in 24 hours; add Banatrol TF BID via tube- each packet provides 5g soluble fiber. Consider changing scheduled bowel regimen to prn   NUTRITION DIAGNOSIS:   Increased nutrient needs related to acute illness as evidenced by estimated needs.  Being addressed via TF  GOAL:   Patient will meet greater than or equal to 90% of their needs  Progressing  MONITOR:   Diet advancement, Vent status, Labs, I & O's  REASON FOR ASSESSMENT:   Consult Enteral/tube feeding initiation and management (Trickle feeds)  ASSESSMENT:   38 y.o male presented with left-sided chest pain and shortness of breath, diagnosed with PE, coded and was intubated after 40 minutes of CPR. Subsequently ECMO started. PMH of PAF, chronic HFpEF, severe mitral regurgitation, hyperthyroidism. Noncompliance with medicines.  2/26 Admitted 2/27 V.tach/V.fib cardiac arrest, intubated, 40 minutes of CPR, tx to [facility], placed on VA ECMO 2/28 CRRT initiated via ECMO circuit, Citrate Protocol 3/01 Vital High Protein started at 20 ml/hr by MD 3/02 MD started titration of TF  Pt remains on vent support, VA ECMO, Impella CP vent. OR today for TEE, changing Impella to 5.5. CRRT via ECMO circuit, citrate protocol. Not requiring pressors currently Access: 1) 25 FR multi-stage venous drainage RFV 2) 21 FR arterial return LCFA 3) 6FR L SFA distal perfusion catheter 4) Impella CP via R CFA LV vent   Per RN, TF started on Saturday 3/01. Vital High Protein at 20  ml/hr; TF increased on Sunday 3/02 with TF rate up to 50 ml/hr prior to being stopped for OR. No issues with tolerance per report. Plan cor Cortrak  Remains anuric +935 mL of stool in 24 hours  Current wt 86.6 kg, admission wt documented as 97.5 kg. Wt post transfer to [facility] on 2/28. Noted net + since admit based on I/O, mild non-pitting edema present  Labs: Lactic Acid 0.9 (wdl), CBGs 201, phosphorus 2.5 (wdl), magnesium 2.2 (wdl), sodium 140 (wdl), potassium 4.0 (wdl) Meds: thiamine, rena-vite, miralax daily, reglan, ss novolog, novolog q 4 hours  Diet Order:   Diet Order             Diet NPO time specified  Diet effective now                   EDUCATION NEEDS:   Not appropriate for education at this time  Skin:  Skin Assessment: Reviewed RN Assessment  Last BM:  3/3 via FMS (900 mL in 24 hours)  Height:   Ht Readings from Last 1 Encounters:  07/09/23 6\' 4"  (1.93 m)    Weight:   Wt Readings from Last 1 Encounters:  07/14/23 86.6 kg    Ideal Body Weight:  91.8 kg  BMI:  Body mass index is 23.24 kg/m.  Estimated Nutritional Needs:   Kcal:  2500-2800 kcals  Protein:  190-210 gm  Fluid:  >2L/day   Romelle Starcher MS, RDN, LDN, CNSC Registered Dietitian 3 Clinical Nutrition RD Inpatient Contact  Info in [employer]

## 2023-07-14 NOTE — Progress Notes (Signed)
 PHARMACY - ANTICOAGULATION CONSULT NOTE  Pharmacy Consult for bivalirudin Indication:  ECMO, Impella  No Known Allergies  Patient Measurements: Height: 6\' 4"  (193 cm) Weight: 86.6 kg (190 lb 14.7 oz) IBW/kg (Calculated) : 86.8 Heparin Dosing Weight: n/a  Vital Signs: Temp: 98.4 F (36.9 C) (03/03 0700) Temp Source: Core (03/02 2000) Pulse Rate: 83 (03/03 0700)  Labs: Recent Labs    07/11/23 1854 07/11/23 2024 07/12/23 0427 07/12/23 0440 07/13/23 0403 07/13/23 0409 07/13/23 1607 07/13/23 1608 07/13/23 1635 07/13/23 1805 07/14/23 0017 07/14/23 0259 07/14/23 0327  HGB  --    < > 8.0*   < > 8.0*   < > 7.4*   < > 9.2*   < > 9.9* 7.5* 7.2*  HCT  --    < > 23.9*   < > 22.9*   < > 20.6*   < > 27.0*   < > 29.0* 22.0* 20.1*  PLT  --    < > 96*   < > 88*  --  86*  --   --   --   --   --  87*  APTT  --    < >  --    < > 60*  --  61*  --   --   --   --   --  58*  LABPROT  --   --  28.1*  --  18.6*  --   --   --   --   --   --   --  17.1*  INR  --   --  2.6*  --  1.5*  --   --   --   --   --   --   --  1.4*  CREATININE  --    < > 3.03*   < > 3.09*   < > 3.20*   < > 2.40*  --  2.50*  --  3.26*  CKTOTAL 357  --   --   --   --   --   --   --   --   --   --   --   --    < > = values in this interval not displayed.    Estimated Creatinine Clearance: 37.6 mL/min (A) (by C-G formula based on SCr of 3.26 mg/dL (H)).   Medical History: Past Medical History:  Diagnosis Date   Atrial fibrillation [facility])    Hyperthyroidism    Mitral regurgitation    NSTEMI (non-ST elevated myocardial infarction) [facility])     Assessment: 38 yo M with bilateral PE s/p TNK (2/27 @1156 ) now on [place] ECMO + Impella. Pharmacy consulted for bivalirudin for anticoagulation.   aPTT remains therapeutic at 58, on bivalirudin 0.03 mg/kg/hr. Hgb 7.2, Plt 87, LDH >2500, fibrinogen 308. LFTs trending downward - total bilirubin similar at 7.9. Requiring PRBCs. No active bleeding - old blood with suction. Remains on CRRT  - requiring citrate. Minimal fibrin in ECMO circuit.   Goal of Therapy:  aPTT 50-70 seconds Monitor platelets by anticoagulation protocol: Yes   Plan:  Continue bivalirudin 0.03mg /kg/hr (using dose weight 97.5 kg) aPTT q12h F/u CBC, LDH, fibrinogen Monitor s/s bleeding   Thank you for allowing pharmacy to participate in this patient's care,  Sherron Monday, PharmD, BCCCP Clinical Pharmacist  Phone: [phone] 07/14/2023 7:52 AM  Please check AMION for all [facility] Pharmacy phone numbers After 10:00 PM, call Main Pharmacy [phone]

## 2023-07-14 NOTE — Anesthesia Procedure Notes (Signed)
 Date/Time: 07/14/2023 10:41 AM  Performed by: Nils Pyle, CRNAPre-anesthesia Checklist: Patient identified, Emergency Drugs available, Suction available and Patient being monitored Patient Re-evaluated:Patient Re-evaluated prior to induction Oxygen Delivery Method: Circle system utilized Preoxygenation: Pre-oxygenation with 100% oxygen Induction Type: Inhalational induction with existing ETT Placement Confirmation: positive ETCO2 and breath sounds checked- equal and bilateral Dental Injury: Teeth and Oropharynx as per pre-operative assessment

## 2023-07-14 NOTE — Progress Notes (Signed)
 Pharmacy Antibiotic Note  Aaron [name] is a 38 y.o. male admitted on 07/09/2023 with surgical prophylaxis s/p ECMO cannulation 2/28. Starting CRRT. Pharmacy has been consulted for vancomcyin dosing.  Currently on day #4 of meropenem/vancomycin - WBC up to 32 (on steroids), LA is 0.9, Scr 2.6 (on CRRT). Trach aspirate 2/28 showing rare staph aureus (sensitivities pending). Underwent impella 5.5 upgrade today.   Plan: Vancomycin 1gm IV q24h  Meropenem 1gm IV q8h  Monitor CRRT tolerance, cx results, clinical pic - f/u vnac levels   Height: 6\' 4"  (193 cm) Weight: 86.6 kg (190 lb 14.7 oz) IBW/kg (Calculated) : 86.8  Temp (24hrs), Avg:98.4 F (36.9 C), Min:98.1 F (36.7 C), Max:98.8 F (37.1 C)  Recent Labs  Lab 07/12/23 0031 07/12/23 0216 07/12/23 0445 07/12/23 0448 07/12/23 0657 07/12/23 0820 07/12/23 1159 07/12/23 1207 07/12/23 1600 07/12/23 1611 07/13/23 0403 07/13/23 0413 07/13/23 0416 07/13/23 1607 07/13/23 1608 07/13/23 1635 07/14/23 0017 07/14/23 0327 07/14/23 0814 07/14/23 0823  WBC  --    < >  --   --  27.3*  --   --   --  29.9*  --  31.1*  --   --  32.1*  --   --   --  32.2*  --   --   CREATININE  --    < >  --    < >  --    < >  --    < > 3.01*   < > 3.09*  --    < > 3.20* 3.60* 2.40* 2.50* 3.26* 2.60*  --   LATICACIDVEN 3.1*  --  2.4*  --   --   --  2.0*  --   --   --   --  1.3  --   --   --   --   --   --   --  0.9   < > = values in this interval not displayed.    Estimated Creatinine Clearance: 47.2 mL/min (A) (by C-G formula based on SCr of 2.6 mg/dL (H)).    No Known Allergies  Thank you for allowing pharmacy to participate in this patient's care,  Sherron Monday, PharmD, BCCCP Clinical Pharmacist  Phone: [phone] 07/14/2023 3:34 PM  Please check AMION for all [facility] Pharmacy phone numbers After 10:00 PM, call Main Pharmacy [phone]

## 2023-07-14 NOTE — Progress Notes (Signed)
 Advanced Heart Failure Rounding Note  Cardiologist: Christell Constant, MD  Chief Complaint: V-A ECMO  Subjective:    Admitted 2/26 with worsening shortness of breath, chest pain, atrial fibrillation with RVR. Decompensated 2/27 with IVF, nodal blockade with subsequent respiratory arrest, ROSC achieved after ~16 minute down time Femoral VA ecmo cannulation 2/27 with Impella CP vent  - CVP 13, PA 54/33 PCWP 22 on Impella at P2 flowing 2.3LPM & V-A ECMO support at 4LPM - 6.2L volume removal q24h, negative 800cc - LDH>2500   Objective:   Weight Range: 86.6 kg Body mass index is 23.24 kg/m.   Vital Signs:   Temp:  [98.1 F (36.7 C)-98.8 F (37.1 C)] 98.6 F (37 C) (03/03 0745) Pulse Rate:  [62-84] 81 (03/03 0745) Resp:  [9-16] 10 (03/03 0745) SpO2:  [96 %-100 %] 100 % (03/03 0745) Arterial Line BP: (110-141)/(69-87) 131/85 (03/03 0745) FiO2 (%):  [40 %-50 %] 40 % (03/03 0400) Weight:  [86.6 kg] 86.6 kg (03/03 0500) Last BM Date : 07/13/23  Weight change: Filed Weights   07/12/23 0500 07/13/23 0600 07/14/23 0500  Weight: 88.7 kg 90.9 kg 86.6 kg    Intake/Output:   Intake/Output Summary (Last 24 hours) at 07/14/2023 0813 Last data filed at 07/14/2023 0800 Gross per 24 hour  Intake 5441.22 ml  Output 6060.9 ml  Net -619.68 ml      Physical Exam    General: sedated/intubated Lungs: b/l rhonchi; mechanical lung sounds CV: RRR; lines well sutured with no hematoma, erythema or bleeding. Femoral CP & ECMO cannulas sutured into place no hematoma/bleeding; distal pulses intact.  Abdomen: soft Neurologic: sedated   Telemetry   Sinus rhythm   Labs    CBC Recent Labs    07/13/23 1607 07/13/23 1608 07/14/23 0259 07/14/23 0327  WBC 32.1*  --   --  32.2*  HGB 7.4*   < > 7.5* 7.2*  HCT 20.6*   < > 22.0* 20.1*  MCV 76.9*  --   --  77.6*  PLT 86*  --   --  87*   < > = values in this interval not displayed.   Basic Metabolic Panel Recent Labs     07/13/23 0403 07/13/23 0409 07/13/23 1607 07/13/23 1608 07/14/23 0017 07/14/23 0259 07/14/23 0327  NA 138   < > 141   < > 140 139 137  K 3.9   < > 4.0   < > 3.7 4.0 4.0  CL 93*   < > 94*   < > 87*  --  92*  CO2 32  --  34*  --   --   --  34*  GLUCOSE 223*   < > 168*   < > 247*  --  219*  BUN 36*   < > 37*   < > 36*  --  46*  CREATININE 3.09*   < > 3.20*   < > 2.50*  --  3.26*  CALCIUM 9.5  --  9.3  --   --   --  8.9  MG 2.4  --   --   --   --   --  2.2  PHOS 2.9  --   --   --   --   --  2.5   < > = values in this interval not displayed.   Liver Function Tests Recent Labs    07/13/23 0403 07/14/23 0327  AST 970* 767*  ALT 355* 224*  ALKPHOS 74  68  BILITOT 7.3* 7.9*  PROT 4.7* NOT CALCULATED  ALBUMIN 2.0* 2.2*   No results for input(s): "LIPASE", "AMYLASE" in the last 72 hours.  Cardiac Enzymes Recent Labs    07/11/23 1854  CKTOTAL 357    BNP: BNP (last 3 results) Recent Labs    07/09/23 1934 07/10/23 1224  BNP 703.7* 980.5*   Thyroid Function Tests Recent Labs    07/14/23 0327  TSH <0.010*     Medications:     Scheduled Medications:  sodium chloride   Intravenous Once   Chlorhexidine Gluconate Cloth  6 each Topical Daily   clonazePAM  1 mg Per Tube BID   docusate  100 mg Per Tube BID   fentaNYL (SUBLIMAZE) injection  50 mcg Intravenous Once   folic acid  1 mg Per Tube Daily   hydrocortisone sod succinate (SOLU-CORTEF) inj  100 mg Intravenous Q8H   insulin aspart  0-20 Units Subcutaneous Q4H   insulin aspart  4 Units Subcutaneous Q4H   methimazole  15 mg Per Tube Q8H   metoCLOPramide (REGLAN) injection  10 mg Intravenous Q8H   multivitamin  1 tablet Per Tube QHS   mouth rinse  15 mL Mouth Rinse Q2H   oxyCODONE  5 mg Per Tube Q6H   pantoprazole (PROTONIX) IV  40 mg Intravenous QHS   polyethylene glycol  17 g Per Tube Daily   QUEtiapine  50 mg Per Tube BID   sodium bicarbonate  50 mEq Intravenous Once   sodium chloride flush  3 mL Intravenous  Q12H   thiamine  100 mg Per Tube Daily   Or   thiamine  100 mg Intravenous Daily    Infusions:  albumin human Stopped (07/14/23 0301)   bivalirudin (ANGIOMAX) 250 mg in sodium chloride 0.9 % 500 mL (0.5 mg/mL) infusion 0.03 mg/kg/hr (07/14/23 0800)   calcium gluconate 20 g in dextrose 5 % 1,000 mL infusion 60 mL/hr at 07/14/23 0800   citrate dextrose 3,000 mL (07/14/23 0017)   dexmedetomidine (PRECEDEX) IV infusion 1.2 mcg/kg/hr (07/14/23 0800)   fentaNYL infusion INTRAVENOUS 200 mcg/hr (07/14/23 0800)   meropenem (MERREM) IV Stopped (07/14/23 0541)   norepinephrine (LEVOPHED) Adult infusion Stopped (07/12/23 0428)   PrismaSol BGK 2/3.5 50 mL/hr at 07/11/23 1039   prismasol BGK 4/2.5 1,500 mL/hr at 07/14/23 0805   prismasol BGK 4/2.5 400 mL/hr at 07/13/23 2155   sodium bicarbonate 25 mEq (Impella PURGE) in dextrose 5 % 1000 mL bag 5.8 mL/hr at 07/13/23 0248   vancomycin Stopped (07/13/23 1012)   vasopressin Stopped (07/13/23 0022)    PRN Medications: acetaminophen **OR** acetaminophen, albumin human, fentaNYL, heparin, midazolam, mouth rinse, rocuronium    Patient Profile   Patient with a history of Grave's disease, severe primary mitral regurgitation who presented with chest pain and segmental PE. Subsequent progressed to SCAI Stage E shock requiring VA ECMO cannulation on 2/27.   Assessment/Plan   SCAI Stage E Cardiogenic shock - Suspect hypoxic respiratory failure driven with segmental PE on top of severe MR, nodal blockage, and thyrotoxicosis - Down time ~ 20 minutes, good mental status confirmed post code - Impella CP placed for LV vent given severe MR - Lactic acid resolved; repeat this AM pending.  - CVP 14, PA 50s/30s, PCWP 22 with minimal to no V waves.  - BP 130s/80s - Vanc/meropenem for antibiotic prophylaxis, suspect leukocytosis is reactive and in the setting of steroids; WBC ct now plateau at 32K.  - Plan for impella 5.5  placement today; discussed with CTS.  -  Target aggressive fluid removal over the next 24-48h prior to decannulation.  Severe mitral valve regurgitation - Noted on previous echocardiogram - Suspect contributing to ongoing shock - ECPella for management currently - TCTS consult for consideration of mitral valve repair - Neurologically intact; will plan on upgrade to 5.5, decannulation and 'prehab' prior to MVR.   Anemia - Multiple blood transfusions yesterday - Suspect component of hemolysis given bilirubin, LDH, slowly trending downwards - CK and lactic acid reassuring - Decrease ECMO flow as above - Active type and screen - Transfused 1U overnight  Acute renal failure/hyperkalemia - Appreciate nephrology consult - CRRT set up through ECMO circuit  Thyrotoxicosis - Was not taking medications as they made him feel poorly - Suspect underlying low output heart failure due to mitral valve disease - Continue IV hydrocortisone 100mg  q8 - Continue methimazole 15mg  q8 - TSH below detectable limit; T4 0.54 07/14/23,  - Decrease hydrocortisone to 50mg  after next dose; will discuss reducing methimazole dosing with pharmD.   - Will need definitive outpatient treatment  Atrial fibrillation - Present on arrival, in the setting of PE and thyroid disease - NSR today - Bivalrudin for anticoagulation  Pulmonary embolism:  - Segmental without right heart strain - Bivalrudin as above  CAD - Prior embolic infarct in the LAD territory with corresponding LGE on CMR - Lifelong anticoagulation  Sedation - Fentanyl, precedex - Versed as needed - Spot EEG without evidence of seizure - Notably minimal alcohol use after discussion with fiance.   Medication concerns reviewed with patient and pharmacy team. Barriers identified: antibiotic dosing, CRRT  Length of Stay: 5  Jabreel Chimento, DO  07/14/2023, 8:13 AM  Advanced Heart Failure Team Pager [phone] (M-F; 7a - 5p)  Please contact [facility] for night-coverage after hours  (5p -7a ) and weekends on [email or web address]  CRITICAL CARE Performed by: Dorthula Nettles   Total critical care time: 40 minutes  Critical care time was exclusive of separately billable procedures and treating other patients.  Critical care was necessary to treat or prevent imminent or life-threatening deterioration.  Critical care was time spent personally by me on the following activities: development of treatment plan with patient and/or surrogate as well as nursing, discussions with consultants, evaluation of patient's response to treatment, examination of patient, obtaining history from patient or surrogate, ordering and performing treatments and interventions, ordering and review of laboratory studies, ordering and review of radiographic studies, pulse oximetry and re-evaluation of patient's condition.

## 2023-07-14 NOTE — Anesthesia Preprocedure Evaluation (Addendum)
 Anesthesia Evaluation  Patient identified by MRN, date of birth, ID band Patient unresponsive    Reviewed: Patient's Chart, lab work & pertinent test results, Unable to perform ROS - Chart review only  Airway Mallampati: Intubated       Dental no notable dental hx.    Pulmonary neg pulmonary ROS    + decreased breath sounds  + intubated    Cardiovascular hypertension, + Past MI   Rhythm:Regular Rate:Normal  Cardiogenic shock 2/2 PE on ECMO here for impella upsize to 5.5   Neuro/Psych negative neurological ROS  negative psych ROS   GI/Hepatic negative GI ROS, Neg liver ROS,,,  Endo/Other    Renal/GU negative Renal ROS  negative genitourinary   Musculoskeletal negative musculoskeletal ROS (+)    Abdominal   Peds  Hematology Lab Results      Component                Value               Date                      WBC                      32.2 (H)            07/14/2023                HGB                      8.8 (L)             07/14/2023                HCT                      26.0 (L)            07/14/2023                MCV                      77.6 (L)            07/14/2023                PLT                      87 (L)              07/14/2023             Lab Results      Component                Value               Date                      NA                       140                 07/14/2023                K                        3.8  07/14/2023                CO2                      34 (H)              07/14/2023                GLUCOSE                  244 (H)             07/14/2023                BUN                      43 (H)              07/14/2023                CREATININE               2.60 (H)            07/14/2023                CALCIUM                  8.9                 07/14/2023                GFRNONAA                 24 (L)              07/14/2023              Anesthesia Other  Findings severe mitral regurgitation, acute PE, VT/VF arrest, acute renal failure, acute hepatic failure and history of paroxysmal Atrial fibrillation.     S/p arrest on [place] ECMO with Impella Cp. Plan is for Impella 5.5 via right axillary approach to allow for removal of groin line, mobilization and support for weaning from ECMO   Reproductive/Obstetrics                             Anesthesia Physical Anesthesia Plan  ASA: 5  Anesthesia Plan: General   Post-op Pain Management:    Induction: Intravenous  PONV Risk Score and Plan:   Airway Management Planned: Oral ETT  Additional Equipment: Arterial line, CVP, PA Cath and TEE  Intra-op Plan:   Post-operative Plan: Post-operative intubation/ventilation  Informed Consent:      History available from chart only  Plan Discussed with: CRNA  Anesthesia Plan Comments:        Anesthesia Quick Evaluation

## 2023-07-14 NOTE — Consult Note (Signed)
 Reason for Consult:Impella placement Referring Physician: Dr. Claire Shown Aaron [name] is an 38 y.o. male.  HPI: 38 yo man with known severe mitral regurgitation, paroxysmal atrial fibrillation, and hypothyroidism.  Presented last week with CP, SOB, N/V.  Ct angio showed bilateral PE- started on IV heparin.  2/27 had VT/ VF arrest requiring 40 minutes of CPR and multiple shocks.  Placed on [facility] ECMO with Impella CP.  Remains on ECMO/ Impella.  Still on ventilator but has woken up and followed commands.  Past Medical History:  Diagnosis Date   Atrial fibrillation [facility])    Hyperthyroidism    Mitral regurgitation    NSTEMI (non-ST elevated myocardial infarction) [facility])     Past Surgical History:  Procedure Laterality Date   ECMO CANNULATION N/A 07/10/2023   Procedure: ECMO CANNULATION;  Surgeon: Dolores Patty, MD;  Location: [facility] INV[facility] CV LAB;  Service: Cardiovascular;  Laterality: N/A;   LEFT HEART CATH AND CORONARY ANGIOGRAPHY N/A 10/22/2021   Procedure: LEFT HEART CATH AND CORONARY ANGIOGRAPHY;  Surgeon: Yvonne Kendall, MD;  Location: [facility];  Service: Cardiovascular;  Laterality: N/A;   VENTRICULAR ASSIST DEVICE INSERTION N/A 07/10/2023   Procedure: VENTRICULAR ASSIST DEVICE INSERTION;  Surgeon: Dolores Patty, MD;  Location: [facility];  Service: Cardiovascular;  Laterality: N/A;    Family History  Problem Relation Age of Onset   Heart disease Other     Social History:  reports that he has never smoked. He has never used smokeless tobacco. He reports current drug use. Frequency: 3.00 times per week. Drug: Marijuana. He reports that he does not drink alcohol.  Allergies: No Known Allergies  Medications: Scheduled:  sodium chloride   Intravenous Once   Chlorhexidine Gluconate Cloth  6 each Topical Daily   clonazePAM  1 mg Per Tube BID   docusate  100 mg Per Tube BID   fentaNYL (SUBLIMAZE) injection  50 mcg Intravenous Once   folic acid  1 mg Per Tube  Daily   hydrocortisone sod succinate (SOLU-CORTEF) inj  100 mg Intravenous Q8H   insulin aspart  0-20 Units Subcutaneous Q4H   insulin aspart  4 Units Subcutaneous Q4H   methimazole  15 mg Per Tube Q8H   metoCLOPramide (REGLAN) injection  10 mg Intravenous Q8H   multivitamin  1 tablet Per Tube QHS   mouth rinse  15 mL Mouth Rinse Q2H   oxyCODONE  5 mg Per Tube Q6H   pantoprazole (PROTONIX) IV  40 mg Intravenous QHS   polyethylene glycol  17 g Per Tube Daily   QUEtiapine  50 mg Per Tube BID   sodium bicarbonate  50 mEq Intravenous Once   sodium chloride flush  3 mL Intravenous Q12H   thiamine  100 mg Per Tube Daily   Or   thiamine  100 mg Intravenous Daily    Results for orders placed or performed during the hospital encounter of 07/09/23 (from the past 48 hours)  I-STAT, chem 8     Status: Abnormal   Collection Time: 07/12/23  8:20 AM  Result Value Ref Range   Sodium 140 135 - 145 mmol/L   Potassium 3.3 (L) 3.5 - 5.1 mmol/L   Chloride 92 (L) 98 - 111 mmol/L   BUN 34 (H) 6 - 20 mg/dL   Creatinine, Ser 5.62 (H) 0.61 - 1.24 mg/dL   Glucose, Bld 130 (H) 70 - 99 mg/dL    Comment: Glucose reference range applies only to samples  taken after fasting for at least 8 hours.   Calcium, Ion 0.41 (LL) 1.15 - 1.40 mmol/L   TCO2 32 22 - 32 mmol/L   Hemoglobin 9.9 (L) 13.0 - 17.0 g/dL   HCT 57.8 (L) 46.9 - 62.9 %   Comment NOTIFIED PHYSICIAN   Prepare RBC (crossmatch)     Status: None   Collection Time: 07/12/23  9:30 AM  Result Value Ref Range   Order Confirmation      ORDER PROCESSED BY BLOOD BANK Performed at [facility], [address] [place], [place] [address]   I-STAT 7, (LYTES, BLD GAS, ICA, H+H)     Status: Abnormal   Collection Time: 07/12/23 10:04 AM  Result Value Ref Range   pH, Arterial 7.533 (H) 7.35 - 7.45   pCO2 arterial 39.0 32 - 48 mmHg   pO2, Arterial 82 (L) 83 - 108 mmHg   Bicarbonate 32.9 (H) 20.0 - 28.0 mmol/L   TCO2 34 (H) 22 - 32 mmol/L   O2 Saturation  97 %   Acid-Base Excess 9.0 (H) 0.0 - 2.0 mmol/L   Sodium 137 135 - 145 mmol/L   Potassium 4.0 3.5 - 5.1 mmol/L   Calcium, Ion 1.10 (L) 1.15 - 1.40 mmol/L   HCT 25.0 (L) 39.0 - 52.0 %   Hemoglobin 8.5 (L) 13.0 - 17.0 g/dL   Patient temperature 32.4 C    Sample type ARTERIAL   I-STAT, chem 8     Status: Abnormal   Collection Time: 07/12/23 10:06 AM  Result Value Ref Range   Sodium 140 135 - 145 mmol/L   Potassium 3.9 3.5 - 5.1 mmol/L   Chloride 92 (L) 98 - 111 mmol/L   BUN 38 (H) 6 - 20 mg/dL   Creatinine, Ser 4.01 (H) 0.61 - 1.24 mg/dL   Glucose, Bld 027 (H) 70 - 99 mg/dL    Comment: Glucose reference range applies only to samples taken after fasting for at least 8 hours.   Calcium, Ion 0.35 (LL) 1.15 - 1.40 mmol/L   TCO2 34 (H) 22 - 32 mmol/L   Hemoglobin 9.2 (L) 13.0 - 17.0 g/dL   HCT 25.3 (L) 66.4 - 40.3 %   Comment NOTIFIED PHYSICIAN   APTT     Status: Abnormal   Collection Time: 07/12/23 11:55 AM  Result Value Ref Range   aPTT 62 (H) 24 - 36 seconds    Comment:        IF BASELINE aPTT IS ELEVATED, SUGGEST PATIENT RISK ASSESSMENT BE USED TO DETERMINE APPROPRIATE ANTICOAGULANT THERAPY. Performed at [facility], [address] [place], [place] [address]   Glucose, capillary     Status: Abnormal   Collection Time: 07/12/23 11:56 AM  Result Value Ref Range   Glucose-Capillary 199 (H) 70 - 99 mg/dL    Comment: Glucose reference range applies only to samples taken after fasting for at least 8 hours.  I-STAT 7, (LYTES, BLD GAS, ICA, H+H)     Status: Abnormal   Collection Time: 07/12/23 11:58 AM  Result Value Ref Range   pH, Arterial 7.506 (H) 7.35 - 7.45   pCO2 arterial 43.8 32 - 48 mmHg   pO2, Arterial 70 (L) 83 - 108 mmHg   Bicarbonate 34.7 (H) 20.0 - 28.0 mmol/L   TCO2 36 (H) 22 - 32 mmol/L   O2 Saturation 95 %   Acid-Base Excess 11.0 (H) 0.0 - 2.0 mmol/L   Sodium 137 135 - 145 mmol/L  Potassium 4.1 3.5 - 5.1 mmol/L   Calcium, Ion 1.14 (L) 1.15 - 1.40  mmol/L   HCT 26.0 (L) 39.0 - 52.0 %   Hemoglobin 8.8 (L) 13.0 - 17.0 g/dL   Patient temperature 16.1 C    Sample type ARTERIAL   CG4 I-STAT (Lactic acid)     Status: Abnormal   Collection Time: 07/12/23 11:59 AM  Result Value Ref Range   Lactic Acid, Venous 2.0 (HH) 0.5 - 1.9 mmol/L  I-STAT, chem 8     Status: Abnormal   Collection Time: 07/12/23 12:07 PM  Result Value Ref Range   Sodium 141 135 - 145 mmol/L   Potassium 4.1 3.5 - 5.1 mmol/L   Chloride 93 (L) 98 - 111 mmol/L   BUN 40 (H) 6 - 20 mg/dL   Creatinine, Ser 0.96 (H) 0.61 - 1.24 mg/dL   Glucose, Bld 045 (H) 70 - 99 mg/dL    Comment: Glucose reference range applies only to samples taken after fasting for at least 8 hours.   Calcium, Ion 0.47 (LL) 1.15 - 1.40 mmol/L   TCO2 35 (H) 22 - 32 mmol/L   Hemoglobin 9.9 (L) 13.0 - 17.0 g/dL   HCT 40.9 (L) 81.1 - 91.4 %   Comment NOTIFIED PHYSICIAN   I-STAT 7, (LYTES, BLD GAS, ICA, H+H)     Status: Abnormal   Collection Time: 07/12/23  1:56 PM  Result Value Ref Range   pH, Arterial 7.516 (H) 7.35 - 7.45   pCO2 arterial 44.4 32 - 48 mmHg   pO2, Arterial 87 83 - 108 mmHg   Bicarbonate 36.1 (H) 20.0 - 28.0 mmol/L   TCO2 37 (H) 22 - 32 mmol/L   O2 Saturation 98 %   Acid-Base Excess 12.0 (H) 0.0 - 2.0 mmol/L   Sodium 138 135 - 145 mmol/L   Potassium 4.1 3.5 - 5.1 mmol/L   Calcium, Ion 1.07 (L) 1.15 - 1.40 mmol/L   HCT 26.0 (L) 39.0 - 52.0 %   Hemoglobin 8.8 (L) 13.0 - 17.0 g/dL   Patient temperature 78.2 C    Sample type ARTERIAL   I-STAT, chem 8     Status: Abnormal   Collection Time: 07/12/23  1:59 PM  Result Value Ref Range   Sodium 140 135 - 145 mmol/L   Potassium 4.0 3.5 - 5.1 mmol/L   Chloride 91 (L) 98 - 111 mmol/L   BUN 37 (H) 6 - 20 mg/dL   Creatinine, Ser 9.56 (H) 0.61 - 1.24 mg/dL   Glucose, Bld 213 (H) 70 - 99 mg/dL    Comment: Glucose reference range applies only to samples taken after fasting for at least 8 hours.   Calcium, Ion 0.31 (LL) 1.15 - 1.40 mmol/L    TCO2 35 (H) 22 - 32 mmol/L   Hemoglobin 9.5 (L) 13.0 - 17.0 g/dL   HCT 08.6 (L) 57.8 - 46.9 %   Comment NOTIFIED PHYSICIAN   Cooxemetry Panel (carboxy, met, total hgb, O2 sat)     Status: Abnormal   Collection Time: 07/12/23  3:59 PM  Result Value Ref Range   Total hemoglobin 7.7 (L) 12.0 - 16.0 g/dL   O2 Saturation 62.9 %   Carboxyhemoglobin 6.1 (HH) 0.5 - 1.5 %    Comment: CRITICAL RESULT CALLED TO, READ BACK BY AND VERIFIED WITH: SARA LAMBERTH BY HUGHESCH AT 1626PM [id number]    Methemoglobin 3.2 (H) 0.0 - 1.5 %    Comment: Performed at [facility] Lab, [address] [place], [place] [address]  CBC     Status: Abnormal   Collection Time: 07/12/23  4:00 PM  Result Value Ref Range   WBC 29.9 (H) 4.0 - 10.5 K/uL   RBC 3.08 (L) 4.22 - 5.81 MIL/uL   Hemoglobin 7.8 (L) 13.0 - 17.0 g/dL    Comment: Reticulocyte Hemoglobin testing may be clinically indicated, consider ordering this additional test [id number]    HCT 23.0 (L) 39.0 - 52.0 %   MCV 74.7 (L) 80.0 - 100.0 fL   MCH 25.3 (L) 26.0 - 34.0 pg   MCHC 33.9 30.0 - 36.0 g/dL   RDW 62.9 (H) 52.8 - 41.3 %   Platelets 91 (L) 150 - 400 K/uL    Comment: REPEATED TO VERIFY   nRBC 1.6 (H) 0.0 - 0.2 %    Comment: Performed at [facility], [address], [place], [place] [address]  Renal function panel (daily at 1600)     Status: Abnormal   Collection Time: 07/12/23  4:00 PM  Result Value Ref Range   Sodium 137 135 - 145 mmol/L   Potassium 4.4 3.5 - 5.1 mmol/L   Chloride 97 (L) 98 - 111 mmol/L   CO2 28 22 - 32 mmol/L   Glucose, Bld 204 (H) 70 - 99 mg/dL    Comment: Glucose reference range applies only to samples taken after fasting for at least 8 hours.   BUN 35 (H) 6 - 20 mg/dL   Creatinine, Ser 0.27 (H) 0.61 - 1.24 mg/dL   Calcium 8.9 8.9 - 25.3 mg/dL   Phosphorus 4.5 2.5 - 4.6 mg/dL   Albumin 1.9 (L) 3.5 - 5.0 g/dL   GFR, Estimated 26 (L) >60 mL/min    Comment: (NOTE) Calculated using the CKD-EPI Creatinine Equation  (2021)    Anion gap 12 5 - 15    Comment: Performed at [facility] Lab, [address] [place], [place] [address]  Hepatic function panel     Status: Abnormal   Collection Time: 07/12/23  4:00 PM  Result Value Ref Range   Total Protein 4.4 (L) 6.5 - 8.1 g/dL   Albumin 1.9 (L) 3.5 - 5.0 g/dL   AST 3,474 (H) 15 - 41 U/L   ALT 382 (H) 0 - 44 U/L   Alkaline Phosphatase 78 38 - 126 U/L   Total Bilirubin 7.4 (H) 0.0 - 1.2 mg/dL   Bilirubin, Direct 2.6 (H) 0.0 - 0.2 mg/dL   Indirect Bilirubin 4.8 (H) 0.3 - 0.9 mg/dL    Comment: Performed at [facility] Lab, [address], [place], [place] [address]  Lactate dehydrogenase     Status: Abnormal   Collection Time: 07/12/23  4:00 PM  Result Value Ref Range   LDH >2,500 (H) 98 - 192 U/L    Comment: RESULT CONFIRMED BY AUTOMATED DILUTION Performed at [facility] Lab, [address] [place], [place] [address]   APTT     Status: Abnormal   Collection Time: 07/12/23  4:00 PM  Result Value Ref Range   aPTT 61 (H) 24 - 36 seconds    Comment:        IF BASELINE aPTT IS ELEVATED, SUGGEST PATIENT RISK ASSESSMENT BE USED TO DETERMINE APPROPRIATE ANTICOAGULANT THERAPY. Performed at [facility] Lab, [address], [place], [place] [address]   Magnesium     Status: None   Collection Time: 07/12/23  4:00 PM  Result Value Ref Range  Magnesium 2.2 1.7 - 2.4 mg/dL    Comment: Performed at [facility] Lab, [address] [place], [place] [address]  Glucose, capillary     Status: Abnormal   Collection Time: 07/12/23  4:02 PM  Result Value Ref Range   Glucose-Capillary 204 (H) 70 - 99 mg/dL    Comment: Glucose reference range applies only to samples taken after fasting for at least 8 hours.  I-STAT 7, (LYTES, BLD GAS, ICA, H+H)     Status: Abnormal   Collection Time: 07/12/23  4:04 PM  Result Value Ref Range   pH, Arterial 7.521 (H) 7.35 - 7.45   pCO2 arterial 44.7 32 - 48 mmHg   pO2, Arterial 138 (H) 83 - 108 mmHg   Bicarbonate  36.7 (H) 20.0 - 28.0 mmol/L   TCO2 38 (H) 22 - 32 mmol/L   O2 Saturation 99 %   Acid-Base Excess 13.0 (H) 0.0 - 2.0 mmol/L   Sodium 138 135 - 145 mmol/L   Potassium 4.3 3.5 - 5.1 mmol/L   Calcium, Ion 1.06 (L) 1.15 - 1.40 mmol/L   HCT 26.0 (L) 39.0 - 52.0 %   Hemoglobin 8.8 (L) 13.0 - 17.0 g/dL   Patient temperature 29.5 C    Sample type ARTERIAL   I-STAT, chem 8     Status: Abnormal   Collection Time: 07/12/23  4:11 PM  Result Value Ref Range   Sodium 140 135 - 145 mmol/L   Potassium 4.1 3.5 - 5.1 mmol/L   Chloride 90 (L) 98 - 111 mmol/L   BUN 37 (H) 6 - 20 mg/dL   Creatinine, Ser 6.21 (H) 0.61 - 1.24 mg/dL   Glucose, Bld 308 (H) 70 - 99 mg/dL    Comment: Glucose reference range applies only to samples taken after fasting for at least 8 hours.   Calcium, Ion 0.33 (LL) 1.15 - 1.40 mmol/L   TCO2 35 (H) 22 - 32 mmol/L   Hemoglobin 9.2 (L) 13.0 - 17.0 g/dL   HCT 65.7 (L) 84.6 - 96.2 %   Comment NOTIFIED PHYSICIAN   Prepare RBC (crossmatch)     Status: None   Collection Time: 07/12/23  5:17 PM  Result Value Ref Range   Order Confirmation      ORDER PROCESSED BY BLOOD BANK Performed at [facility], [address] [place], [place] [address]   I-STAT 7, (LYTES, BLD GAS, ICA, H+H)     Status: Abnormal   Collection Time: 07/12/23  8:24 PM  Result Value Ref Range   pH, Arterial 7.499 (H) 7.35 - 7.45   pCO2 arterial 49.4 (H) 32 - 48 mmHg   pO2, Arterial 181 (H) 83 - 108 mmHg   Bicarbonate 38.5 (H) 20.0 - 28.0 mmol/L   TCO2 40 (H) 22 - 32 mmol/L   O2 Saturation 100 %   Acid-Base Excess 14.0 (H) 0.0 - 2.0 mmol/L   Sodium 138 135 - 145 mmol/L   Potassium 4.4 3.5 - 5.1 mmol/L   Calcium, Ion 1.06 (L) 1.15 - 1.40 mmol/L   HCT 26.0 (L) 39.0 - 52.0 %   Hemoglobin 8.8 (L) 13.0 - 17.0 g/dL   Patient temperature 13.2 C    Sample type ARTERIAL   Glucose, capillary     Status: Abnormal   Collection Time: 07/12/23  8:27 PM  Result Value Ref Range   Glucose-Capillary 186 (H) 70 -  99 mg/dL    Comment: Glucose reference range applies only to samples  taken after fasting for at least 8 hours.  I-STAT, chem 8     Status: Abnormal   Collection Time: 07/12/23  8:28 PM  Result Value Ref Range   Sodium 139 135 - 145 mmol/L   Potassium 4.1 3.5 - 5.1 mmol/L   Chloride 90 (L) 98 - 111 mmol/L   BUN 37 (H) 6 - 20 mg/dL   Creatinine, Ser 1.61 (H) 0.61 - 1.24 mg/dL   Glucose, Bld 096 (H) 70 - 99 mg/dL    Comment: Glucose reference range applies only to samples taken after fasting for at least 8 hours.   Calcium, Ion 0.34 (LL) 1.15 - 1.40 mmol/L   TCO2 36 (H) 22 - 32 mmol/L   Hemoglobin 9.9 (L) 13.0 - 17.0 g/dL   HCT 04.5 (L) 40.9 - 81.1 %   Comment NOTIFIED PHYSICIAN   I-STAT 7, (LYTES, BLD GAS, ICA, H+H)     Status: Abnormal   Collection Time: 07/12/23 10:15 PM  Result Value Ref Range   pH, Arterial 7.494 (H) 7.35 - 7.45   pCO2 arterial 50.8 (H) 32 - 48 mmHg   pO2, Arterial 198 (H) 83 - 108 mmHg   Bicarbonate 39.2 (H) 20.0 - 28.0 mmol/L   TCO2 41 (H) 22 - 32 mmol/L   O2 Saturation 100 %   Acid-Base Excess 14.0 (H) 0.0 - 2.0 mmol/L   Sodium 139 135 - 145 mmol/L   Potassium 4.1 3.5 - 5.1 mmol/L   Calcium, Ion 1.03 (L) 1.15 - 1.40 mmol/L   HCT 25.0 (L) 39.0 - 52.0 %   Hemoglobin 8.5 (L) 13.0 - 17.0 g/dL   Patient temperature 91.4 C    Sample type ARTERIAL   I-STAT 7, (LYTES, BLD GAS, ICA, H+H)     Status: Abnormal   Collection Time: 07/13/23 12:37 AM  Result Value Ref Range   pH, Arterial 7.574 (H) 7.35 - 7.45   pCO2 arterial 41.4 32 - 48 mmHg   pO2, Arterial 136 (H) 83 - 108 mmHg   Bicarbonate 38.3 (H) 20.0 - 28.0 mmol/L   TCO2 40 (H) 22 - 32 mmol/L   O2 Saturation 99 %   Acid-Base Excess 15.0 (H) 0.0 - 2.0 mmol/L   Sodium 138 135 - 145 mmol/L   Potassium 4.0 3.5 - 5.1 mmol/L   Calcium, Ion 1.07 (L) 1.15 - 1.40 mmol/L   HCT 25.0 (L) 39.0 - 52.0 %   Hemoglobin 8.5 (L) 13.0 - 17.0 g/dL   Patient temperature 78.2 C    Sample type ARTERIAL   Glucose, capillary      Status: Abnormal   Collection Time: 07/13/23 12:41 AM  Result Value Ref Range   Glucose-Capillary 217 (H) 70 - 99 mg/dL    Comment: Glucose reference range applies only to samples taken after fasting for at least 8 hours.  I-STAT, chem 8     Status: Abnormal   Collection Time: 07/13/23 12:41 AM  Result Value Ref Range   Sodium 140 135 - 145 mmol/L   Potassium 3.9 3.5 - 5.1 mmol/L   Chloride 90 (L) 98 - 111 mmol/L   BUN 37 (H) 6 - 20 mg/dL   Creatinine, Ser 9.56 (H) 0.61 - 1.24 mg/dL   Glucose, Bld 213 (H) 70 - 99 mg/dL    Comment: Glucose reference range applies only to samples taken after fasting for at least 8 hours.   Calcium, Ion 0.34 (LL) 1.15 - 1.40 mmol/L   TCO2 35 (H) 22 -  32 mmol/L   Hemoglobin 9.9 (L) 13.0 - 17.0 g/dL   HCT 96.0 (L) 45.4 - 09.8 %   Comment NOTIFIED PHYSICIAN   CBC     Status: Abnormal   Collection Time: 07/13/23  4:03 AM  Result Value Ref Range   WBC 31.1 (H) 4.0 - 10.5 K/uL   RBC 3.06 (L) 4.22 - 5.81 MIL/uL   Hemoglobin 8.0 (L) 13.0 - 17.0 g/dL    Comment: Reticulocyte Hemoglobin testing may be clinically indicated, consider ordering this additional test [id number]    HCT 22.9 (L) 39.0 - 52.0 %   MCV 74.8 (L) 80.0 - 100.0 fL   MCH 26.1 26.0 - 34.0 pg   MCHC 34.9 30.0 - 36.0 g/dL   RDW 95.6 (H) 21.3 - 08.6 %   Platelets 88 (L) 150 - 400 K/uL    Comment: REPEATED TO VERIFY   nRBC 1.4 (H) 0.0 - 0.2 %    Comment: Performed at [facility], [address] [place], [place] [address]  Basic metabolic panel     Status: Abnormal   Collection Time: 07/13/23  4:03 AM  Result Value Ref Range   Sodium 138 135 - 145 mmol/L   Potassium 3.9 3.5 - 5.1 mmol/L    Comment: HEMOLYSIS AT THIS LEVEL MAY AFFECT RESULT PATIENT HAS IMPELLA DEVICE    Chloride 93 (L) 98 - 111 mmol/L   CO2 32 22 - 32 mmol/L   Glucose, Bld 223 (H) 70 - 99 mg/dL    Comment: Glucose reference range applies only to samples taken after fasting for at least 8 hours.   BUN 36 (H) 6  - 20 mg/dL   Creatinine, Ser 9.62 (H) 0.61 - 1.24 mg/dL   Calcium 9.5 8.9 - 95.2 mg/dL   GFR, Estimated 26 (L) >60 mL/min    Comment: (NOTE) Calculated using the CKD-EPI Creatinine Equation (2021)    Anion gap 13 5 - 15    Comment: Performed at [facility] Lab, [address] [place], [place] [address]  Lactate dehydrogenase     Status: Abnormal   Collection Time: 07/13/23  4:03 AM  Result Value Ref Range   LDH >2,500 (H) 98 - 192 U/L    Comment: HEMOLYSIS AT THIS LEVEL MAY AFFECT RESULT PATIENT HAS IMPELLA DEVICE Performed at [facility], [address] [place], [place] [address]   Protime-INR     Status: Abnormal   Collection Time: 07/13/23  4:03 AM  Result Value Ref Range   Prothrombin Time 18.6 (H) 11.4 - 15.2 seconds   INR 1.5 (H) 0.8 - 1.2    Comment: (NOTE) INR goal varies based on device and disease states. Performed at [facility], [address] [place], [place] [address]   Fibrinogen     Status: None   Collection Time: 07/13/23  4:03 AM  Result Value Ref Range   Fibrinogen 275 210 - 475 mg/dL    Comment: (NOTE) Fibrinogen results may be underestimated in patients receiving thrombolytic therapy. Performed at [facility], [address], [place], [place] [address]   Hepatic function panel     Status: Abnormal   Collection Time: 07/13/23  4:03 AM  Result Value Ref Range   Total Protein 4.7 (L) 6.5 - 8.1 g/dL   Albumin 2.0 (L) 3.5 - 5.0 g/dL   AST 347 (H) 15 - 41 U/L    Comment: HEMOLYSIS AT THIS LEVEL MAY AFFECT RESULT PATIENT  HAS IMPELLA DEVICE    ALT 355 (H) 0 - 44 U/L    Comment: HEMOLYSIS AT THIS LEVEL MAY AFFECT RESULT PATIENT HAS IMPELLA DEVICE RESULT CONFIRMED BY MANUAL DILUTION    Alkaline Phosphatase 74 38 - 126 U/L    Comment: HEMOLYSIS AT THIS LEVEL MAY AFFECT RESULT PATIENT HAS IMPELLA DEVICE    Total Bilirubin 7.3 (H) 0.0 - 1.2 mg/dL    Comment: HEMOLYSIS AT THIS LEVEL MAY AFFECT RESULT PATIENT HAS IMPELLA  DEVICE    Bilirubin, Direct 2.3 (H) 0.0 - 0.2 mg/dL    Comment: HEMOLYSIS AT THIS LEVEL MAY AFFECT RESULT PATIENT HAS IMPELLA DEVICE    Indirect Bilirubin 5.0 (H) 0.3 - 0.9 mg/dL    Comment: Performed at [facility] Lab, [address] [place], [place] [address]  T4, free     Status: None   Collection Time: 07/13/23  4:03 AM  Result Value Ref Range   Free T4 0.86 0.61 - 1.12 ng/dL    Comment: HEMOLYSIS AT THIS LEVEL MAY AFFECT RESULT PATIENT HAS IMPELLA DEVICE (NOTE) Biotin ingestion may interfere with free T4 tests. If the results are inconsistent with the TSH level, previous test results, or the clinical presentation, then consider biotin interference. If needed, order repeat testing after stopping biotin. Performed at [facility], [address], [place], [place] [address]   Magnesium     Status: None   Collection Time: 07/13/23  4:03 AM  Result Value Ref Range   Magnesium 2.4 1.7 - 2.4 mg/dL    Comment: HEMOLYSIS AT THIS LEVEL MAY AFFECT RESULT PATIENT HAS IMPELLA DEVICE Performed at [facility] Lab, [address] [place], [place] [address]   APTT     Status: Abnormal   Collection Time: 07/13/23  4:03 AM  Result Value Ref Range   aPTT 60 (H) 24 - 36 seconds    Comment:        IF BASELINE aPTT IS ELEVATED, SUGGEST PATIENT RISK ASSESSMENT BE USED TO DETERMINE APPROPRIATE ANTICOAGULANT THERAPY. Performed at [facility] Lab, [address] [place], [place] [address]   Phosphorus     Status: None   Collection Time: 07/13/23  4:03 AM  Result Value Ref Range   Phosphorus 2.9 2.5 - 4.6 mg/dL    Comment: HEMOLYSIS AT THIS LEVEL MAY AFFECT RESULT PATIENT HAS IMPELLA DEVICE Performed at [facility] Lab, [address], [place], [place] [address]   TSH     Status: Abnormal   Collection Time: 07/13/23  4:03 AM  Result Value Ref Range   TSH <0.010 (L) 0.350 - 4.500 uIU/mL    Comment: Performed by a 3rd Generation assay with a functional sensitivity of  <=0.01 uIU/mL. Performed at [facility] Lab, [address], [place], [place] [address]   Glucose, capillary     Status: Abnormal   Collection Time: 07/13/23  4:07 AM  Result Value Ref Range   Glucose-Capillary 229 (H) 70 - 99 mg/dL    Comment: Glucose reference range applies only to samples taken after fasting for at least 8 hours.  I-STAT 7, (LYTES, BLD GAS, ICA, H+H)     Status: Abnormal   Collection Time: 07/13/23  4:09 AM  Result Value Ref Range   pH, Arterial 7.554 (H) 7.35 - 7.45   pCO2 arterial 45.6 32 - 48 mmHg   pO2, Arterial 85 83 - 108 mmHg   Bicarbonate 40.4 (H) 20.0 - 28.0 mmol/L  TCO2 42 (H) 22 - 32 mmol/L   O2 Saturation 98 %   Acid-Base Excess 16.0 (H) 0.0 - 2.0 mmol/L   Sodium 139 135 - 145 mmol/L   Potassium 3.8 3.5 - 5.1 mmol/L   Calcium, Ion 1.07 (L) 1.15 - 1.40 mmol/L   HCT 24.0 (L) 39.0 - 52.0 %   Hemoglobin 8.2 (L) 13.0 - 17.0 g/dL   Patient temperature 47.4 C    Sample type ARTERIAL   CG4 I-STAT (Lactic acid)     Status: None   Collection Time: 07/13/23  4:13 AM  Result Value Ref Range   Lactic Acid, Venous 1.3 0.5 - 1.9 mmol/L  I-STAT, chem 8     Status: Abnormal   Collection Time: 07/13/23  4:16 AM  Result Value Ref Range   Sodium 140 135 - 145 mmol/L   Potassium 3.7 3.5 - 5.1 mmol/L   Chloride 90 (L) 98 - 111 mmol/L   BUN 38 (H) 6 - 20 mg/dL   Creatinine, Ser 2.59 (H) 0.61 - 1.24 mg/dL   Glucose, Bld 563 (H) 70 - 99 mg/dL    Comment: Glucose reference range applies only to samples taken after fasting for at least 8 hours.   Calcium, Ion 0.34 (LL) 1.15 - 1.40 mmol/L   TCO2 35 (H) 22 - 32 mmol/L   Hemoglobin 10.5 (L) 13.0 - 17.0 g/dL   HCT 87.5 (L) 64.3 - 32.9 %   Comment NOTIFIED PHYSICIAN   Glucose, capillary     Status: Abnormal   Collection Time: 07/13/23  7:32 AM  Result Value Ref Range   Glucose-Capillary 190 (H) 70 - 99 mg/dL    Comment: Glucose reference range applies only to samples taken after fasting for at least 8 hours.   I-STAT 7, (LYTES, BLD GAS, ICA, H+H)     Status: Abnormal   Collection Time: 07/13/23  7:34 AM  Result Value Ref Range   pH, Arterial 7.563 (H) 7.35 - 7.45   pCO2 arterial 45.3 32 - 48 mmHg   pO2, Arterial 72 (L) 83 - 108 mmHg   Bicarbonate 40.9 (H) 20.0 - 28.0 mmol/L   TCO2 42 (H) 22 - 32 mmol/L   O2 Saturation 96 %   Acid-Base Excess 17.0 (H) 0.0 - 2.0 mmol/L   Sodium 140 135 - 145 mmol/L   Potassium 3.9 3.5 - 5.1 mmol/L   Calcium, Ion 1.15 1.15 - 1.40 mmol/L   HCT 24.0 (L) 39.0 - 52.0 %   Hemoglobin 8.2 (L) 13.0 - 17.0 g/dL   Patient temperature 51.8 C    Sample type ARTERIAL   I-STAT 7, (LYTES, BLD GAS, ICA, H+H)     Status: Abnormal   Collection Time: 07/13/23  9:58 AM  Result Value Ref Range   pH, Arterial 7.510 (H) 7.35 - 7.45   pCO2 arterial 49.3 (H) 32 - 48 mmHg   pO2, Arterial 86 83 - 108 mmHg   Bicarbonate 39.4 (H) 20.0 - 28.0 mmol/L   TCO2 41 (H) 22 - 32 mmol/L   O2 Saturation 97 %   Acid-Base Excess 15.0 (H) 0.0 - 2.0 mmol/L   Sodium 139 135 - 145 mmol/L   Potassium 3.8 3.5 - 5.1 mmol/L   Calcium, Ion 1.12 (L) 1.15 - 1.40 mmol/L   HCT 23.0 (L) 39.0 - 52.0 %   Hemoglobin 7.8 (L) 13.0 - 17.0 g/dL   Patient temperature 84.1 C    Sample type ARTERIAL   Glucose, capillary  Status: Abnormal   Collection Time: 07/13/23 11:15 AM  Result Value Ref Range   Glucose-Capillary 166 (H) 70 - 99 mg/dL    Comment: Glucose reference range applies only to samples taken after fasting for at least 8 hours.  I-STAT 7, (LYTES, BLD GAS, ICA, H+H)     Status: Abnormal   Collection Time: 07/13/23  1:11 PM  Result Value Ref Range   pH, Arterial 7.519 (H) 7.35 - 7.45   pCO2 arterial 47.6 32 - 48 mmHg   pO2, Arterial 115 (H) 83 - 108 mmHg   Bicarbonate 38.9 (H) 20.0 - 28.0 mmol/L   TCO2 40 (H) 22 - 32 mmol/L   O2 Saturation 99 %   Acid-Base Excess 15.0 (H) 0.0 - 2.0 mmol/L   Sodium 140 135 - 145 mmol/L   Potassium 4.0 3.5 - 5.1 mmol/L   Calcium, Ion 1.07 (L) 1.15 - 1.40 mmol/L    HCT 22.0 (L) 39.0 - 52.0 %   Hemoglobin 7.5 (L) 13.0 - 17.0 g/dL   Patient temperature 14.7 C    Sample type ARTERIAL   I-STAT 7, (LYTES, BLD GAS, ICA, H+H)     Status: Abnormal   Collection Time: 07/13/23  2:48 PM  Result Value Ref Range   pH, Arterial 7.508 (H) 7.35 - 7.45   pCO2 arterial 47.1 32 - 48 mmHg   pO2, Arterial 118 (H) 83 - 108 mmHg   Bicarbonate 37.5 (H) 20.0 - 28.0 mmol/L   TCO2 39 (H) 22 - 32 mmol/L   O2 Saturation 99 %   Acid-Base Excess 13.0 (H) 0.0 - 2.0 mmol/L   Sodium 139 135 - 145 mmol/L   Potassium 3.9 3.5 - 5.1 mmol/L   Calcium, Ion 1.09 (L) 1.15 - 1.40 mmol/L   HCT 22.0 (L) 39.0 - 52.0 %   Hemoglobin 7.5 (L) 13.0 - 17.0 g/dL   Patient temperature 82.9 C    Sample type ARTERIAL   Glucose, capillary     Status: Abnormal   Collection Time: 07/13/23  4:01 PM  Result Value Ref Range   Glucose-Capillary 149 (H) 70 - 99 mg/dL    Comment: Glucose reference range applies only to samples taken after fasting for at least 8 hours.  I-STAT 7, (LYTES, BLD GAS, ICA, H+H)     Status: Abnormal   Collection Time: 07/13/23  4:03 PM  Result Value Ref Range   pH, Arterial 7.522 (H) 7.35 - 7.45   pCO2 arterial 48.7 (H) 32 - 48 mmHg   pO2, Arterial 133 (H) 83 - 108 mmHg   Bicarbonate 40.0 (H) 20.0 - 28.0 mmol/L   TCO2 41 (H) 22 - 32 mmol/L   O2 Saturation 99 %   Acid-Base Excess 16.0 (H) 0.0 - 2.0 mmol/L   Sodium 139 135 - 145 mmol/L   Potassium 4.0 3.5 - 5.1 mmol/L   Calcium, Ion 1.13 (L) 1.15 - 1.40 mmol/L   HCT 23.0 (L) 39.0 - 52.0 %   Hemoglobin 7.8 (L) 13.0 - 17.0 g/dL   Patient temperature 56.2 C    Sample type ARTERIAL   CBC     Status: Abnormal   Collection Time: 07/13/23  4:07 PM  Result Value Ref Range   WBC 32.1 (H) 4.0 - 10.5 K/uL   RBC 2.68 (L) 4.22 - 5.81 MIL/uL   Hemoglobin 7.4 (L) 13.0 - 17.0 g/dL    Comment: Reticulocyte Hemoglobin testing may be clinically indicated, consider ordering this additional test [id number]  HCT 20.6 (L) 39.0 -  52.0 %   MCV 76.9 (L) 80.0 - 100.0 fL   MCH 27.6 26.0 - 34.0 pg   MCHC 35.9 30.0 - 36.0 g/dL   RDW 16.1 (H) 09.6 - 04.5 %   Platelets 86 (L) 150 - 400 K/uL    Comment: REPEATED TO VERIFY   nRBC 1.2 (H) 0.0 - 0.2 %    Comment: Performed at [facility], [address], [place], [place] [address]  Basic metabolic panel     Status: Abnormal   Collection Time: 07/13/23  4:07 PM  Result Value Ref Range   Sodium 141 135 - 145 mmol/L   Potassium 4.0 3.5 - 5.1 mmol/L    Comment: HEMOLYSIS AT THIS LEVEL MAY AFFECT RESULT   Chloride 94 (L) 98 - 111 mmol/L   CO2 34 (H) 22 - 32 mmol/L   Glucose, Bld 168 (H) 70 - 99 mg/dL    Comment: Glucose reference range applies only to samples taken after fasting for at least 8 hours.   BUN 37 (H) 6 - 20 mg/dL   Creatinine, Ser 1.91 (H) 0.61 - 1.24 mg/dL   Calcium 9.3 8.9 - 47.8 mg/dL   GFR, Estimated 24 (L) >60 mL/min    Comment: (NOTE) Calculated using the CKD-EPI Creatinine Equation (2021)    Anion gap 13 5 - 15    Comment: Performed at [facility], [address] [place], [place] [address]  APTT     Status: Abnormal   Collection Time: 07/13/23  4:07 PM  Result Value Ref Range   aPTT 61 (H) 24 - 36 seconds    Comment:        IF BASELINE aPTT IS ELEVATED, SUGGEST PATIENT RISK ASSESSMENT BE USED TO DETERMINE APPROPRIATE ANTICOAGULANT THERAPY. Performed at [facility], [address] [place], [place] [address]   I-STAT, [place] 8     Status: Abnormal   Collection Time: 07/13/23  4:08 PM  Result Value Ref Range   Sodium 138 135 - 145 mmol/L   Potassium 4.0 3.5 - 5.1 mmol/L   Chloride 92 (L) 98 - 111 mmol/L   BUN 39 (H) 6 - 20 mg/dL   Creatinine, Ser 5.78 (H) 0.61 - 1.24 mg/dL   Glucose, Bld 469 (H) 70 - 99 mg/dL    Comment: Glucose reference range applies only to samples taken after fasting for at least 8 hours.   Calcium, Ion 1.15 1.15 - 1.40 mmol/L   TCO2 37 (H) 22 - 32 mmol/L   Hemoglobin 9.5 (L) 13.0 - 17.0 g/dL   HCT  62.9 (L) 52.8 - 52.0 %  I-STAT, chem 8     Status: Abnormal   Collection Time: 07/13/23  4:35 PM  Result Value Ref Range   Sodium 141 135 - 145 mmol/L   Potassium 3.8 3.5 - 5.1 mmol/L   Chloride 88 (L) 98 - 111 mmol/L   BUN 33 (H) 6 - 20 mg/dL   Creatinine, Ser 4.13 (H) 0.61 - 1.24 mg/dL   Glucose, Bld 244 (H) 70 - 99 mg/dL    Comment: Glucose reference range applies only to samples taken after fasting for at least 8 hours.   Calcium, Ion 0.33 (LL) 1.15 - 1.40 mmol/L   TCO2 36 (H) 22 - 32 mmol/L   Hemoglobin 9.2 (L) 13.0 - 17.0 g/dL   HCT 01.0 (L) 27.2 - 53.6 %   Comment NOTIFIED PHYSICIAN   Type and  screen [facility]     Status: None (Preliminary result)   Collection Time: 07/13/23  4:37 PM  Result Value Ref Range   ABO/RH(D) O POS    Antibody Screen NEG    Sample Expiration 07/16/2023,2359    Unit Number [id number]    Blood Component Type RED CELLS,LR    Unit division 00    Status of Unit ALLOCATED    Transfusion Status OK TO TRANSFUSE    Crossmatch Result Compatible    Unit Number [id number]    Blood Component Type RED CELLS,LR    Unit division 00    Status of Unit ALLOCATED    Transfusion Status OK TO TRANSFUSE    Crossmatch Result Compatible    Unit Number [id number]    Blood Component Type RED CELLS,LR    Unit division 00    Status of Unit ALLOCATED    Transfusion Status OK TO TRANSFUSE    Crossmatch Result Compatible    Unit Number [id number]    Blood Component Type RED CELLS,LR    Unit division 00    Status of Unit ISSUED    Transfusion Status OK TO TRANSFUSE    Crossmatch Result      Compatible Performed at [facility], [address] [place], [place] [address]    Unit Number [id number]    Blood Component Type RED CELLS,LR    Unit division 00    Status of Unit ALLOCATED    Transfusion Status OK TO TRANSFUSE    Crossmatch Result Compatible   Prepare RBC (crossmatch)     Status: None   Collection Time: 07/13/23   5:12 PM  Result Value Ref Range   Order Confirmation      ORDER PROCESSED BY BLOOD BANK Performed at [facility], [address], [place], [place] [address]   I-STAT 7, (LYTES, BLD GAS, ICA, H+H)     Status: Abnormal   Collection Time: 07/13/23  6:05 PM  Result Value Ref Range   pH, Arterial 7.491 (H) 7.35 - 7.45   pCO2 arterial 52.1 (H) 32 - 48 mmHg   pO2, Arterial 105 83 - 108 mmHg   Bicarbonate 39.9 (H) 20.0 - 28.0 mmol/L   TCO2 41 (H) 22 - 32 mmol/L   O2 Saturation 98 %   Acid-Base Excess 15.0 (H) 0.0 - 2.0 mmol/L   Sodium 138 135 - 145 mmol/L   Potassium 3.9 3.5 - 5.1 mmol/L   Calcium, Ion 1.12 (L) 1.15 - 1.40 mmol/L   HCT 21.0 (L) 39.0 - 52.0 %   Hemoglobin 7.1 (L) 13.0 - 17.0 g/dL   Patient temperature 46.2 C    Sample type ARTERIAL   Glucose, capillary     Status: Abnormal   Collection Time: 07/13/23  9:13 PM  Result Value Ref Range   Glucose-Capillary 152 (H) 70 - 99 mg/dL    Comment: Glucose reference range applies only to samples taken after fasting for at least 8 hours.  Glucose, capillary     Status: Abnormal   Collection Time: 07/14/23 12:12 AM  Result Value Ref Range   Glucose-Capillary 186 (H) 70 - 99 mg/dL    Comment: Glucose reference range applies only to samples taken after fasting for at least 8 hours.  I-STAT 7, (LYTES, BLD GAS, ICA, H+H)     Status: Abnormal   Collection Time: 07/14/23 12:14 AM  Result Value Ref Range   pH, Arterial 7.462 (H) 7.35 - 7.45  pCO2 arterial 55.3 (H) 32 - 48 mmHg   pO2, Arterial 67 (L) 83 - 108 mmHg   Bicarbonate 39.6 (H) 20.0 - 28.0 mmol/L   TCO2 41 (H) 22 - 32 mmol/L   O2 Saturation 94 %   Acid-Base Excess 14.0 (H) 0.0 - 2.0 mmol/L   Sodium 138 135 - 145 mmol/L   Potassium 3.9 3.5 - 5.1 mmol/L   Calcium, Ion 1.12 (L) 1.15 - 1.40 mmol/L   HCT 25.0 (L) 39.0 - 52.0 %   Hemoglobin 8.5 (L) 13.0 - 17.0 g/dL   Patient temperature 21.3 C    Sample type ARTERIAL   I-STAT, chem 8     Status: Abnormal   Collection  Time: 07/14/23 12:17 AM  Result Value Ref Range   Sodium 140 135 - 145 mmol/L   Potassium 3.7 3.5 - 5.1 mmol/L   Chloride 87 (L) 98 - 111 mmol/L   BUN 36 (H) 6 - 20 mg/dL   Creatinine, Ser 0.86 (H) 0.61 - 1.24 mg/dL   Glucose, Bld 578 (H) 70 - 99 mg/dL    Comment: Glucose reference range applies only to samples taken after fasting for at least 8 hours.   Calcium, Ion 0.32 (LL) 1.15 - 1.40 mmol/L   TCO2 34 (H) 22 - 32 mmol/L   Hemoglobin 9.9 (L) 13.0 - 17.0 g/dL   HCT 46.9 (L) 62.9 - 52.8 %   Comment NOTIFIED PHYSICIAN   I-STAT 7, (LYTES, BLD GAS, ICA, H+H)     Status: Abnormal   Collection Time: 07/14/23  2:59 AM  Result Value Ref Range   pH, Arterial 7.462 (H) 7.35 - 7.45   pCO2 arterial 55.6 (H) 32 - 48 mmHg   pO2, Arterial 77 (L) 83 - 108 mmHg   Bicarbonate 39.9 (H) 20.0 - 28.0 mmol/L   TCO2 42 (H) 22 - 32 mmol/L   O2 Saturation 96 %   Acid-Base Excess 14.0 (H) 0.0 - 2.0 mmol/L   Sodium 139 135 - 145 mmol/L   Potassium 4.0 3.5 - 5.1 mmol/L   Calcium, Ion 1.14 (L) 1.15 - 1.40 mmol/L   HCT 22.0 (L) 39.0 - 52.0 %   Hemoglobin 7.5 (L) 13.0 - 17.0 g/dL   Patient temperature 41.32 C    Collection site art line    Drawn by Operator    Sample type ARTERIAL   CBC     Status: Abnormal   Collection Time: 07/14/23  3:27 AM  Result Value Ref Range   WBC 32.2 (H) 4.0 - 10.5 K/uL   RBC 2.59 (L) 4.22 - 5.81 MIL/uL   Hemoglobin 7.2 (L) 13.0 - 17.0 g/dL    Comment: Reticulocyte Hemoglobin testing may be clinically indicated, consider ordering this additional test [id number]    HCT 20.1 (L) 39.0 - 52.0 %   MCV 77.6 (L) 80.0 - 100.0 fL   MCH 27.8 26.0 - 34.0 pg   MCHC 35.8 30.0 - 36.0 g/dL   RDW 53.6 (H) 64.4 - 03.4 %   Platelets 87 (L) 150 - 400 K/uL    Comment: REPEATED TO VERIFY   nRBC 1.7 (H) 0.0 - 0.2 %    Comment: Performed at [facility], [address], [place], [place] [address]  Basic metabolic panel     Status: Abnormal   Collection Time: 07/14/23  3:27 AM   Result Value Ref Range   Sodium 137 135 - 145 mmol/L   Potassium 4.0 3.5 - 5.1 mmol/L  Comment: PATIENT HAS AN IMPELLA DEVICE OK TO RELEASE   Chloride 92 (L) 98 - 111 mmol/L   CO2 34 (H) 22 - 32 mmol/L   Glucose, Bld 219 (H) 70 - 99 mg/dL    Comment: Glucose reference range applies only to samples taken after fasting for at least 8 hours.   BUN 46 (H) 6 - 20 mg/dL   Creatinine, Ser 1.61 (H) 0.61 - 1.24 mg/dL   Calcium 8.9 8.9 - 09.6 mg/dL   GFR, Estimated 24 (L) >60 mL/min    Comment: (NOTE) Calculated using the CKD-EPI Creatinine Equation (2021)    Anion gap 11 5 - 15    Comment: Performed at [facility], [address], [place], [place] [address]  Lactate dehydrogenase     Status: Abnormal   Collection Time: 07/14/23  3:27 AM  Result Value Ref Range   LDH >2,500 (H) 98 - 192 U/L    Comment: PATIENT HAS AN IMPELLA DEVICE OK TO RELEASE Performed at [facility], [address], [place], [place] [address]   Protime-INR     Status: Abnormal   Collection Time: 07/14/23  3:27 AM  Result Value Ref Range   Prothrombin Time 17.1 (H) 11.4 - 15.2 seconds   INR 1.4 (H) 0.8 - 1.2    Comment: (NOTE) INR goal varies based on device and disease states. Performed at [facility], [address], [place], [place] [address]   Fibrinogen     Status: None   Collection Time: 07/14/23  3:27 AM  Result Value Ref Range   Fibrinogen 308 210 - 475 mg/dL    Comment: (NOTE) Fibrinogen results may be underestimated in patients receiving thrombolytic therapy. Performed at [facility], [address], [place], [place] [address]   Hepatic function panel     Status: Abnormal   Collection Time: 07/14/23  3:27 AM  Result Value Ref Range   Total Protein NOT CALCULATED 6.5 - 8.1 g/dL    Comment: PATIENT HAS AN IMPELLA DEVICE OK TO RELEASE   Albumin 2.2 (L) 3.5 - 5.0 g/dL   AST 308 (H) 15 - 41 U/L    Comment: PATIENT HAS AN IMPELLA DEVICE OK TO RELEASE   ALT 224 (H) 0 -  44 U/L    Comment: RESULT CONFIRMED BY MANUAL DILUTION PATIENT HAS AN IMPELLA DEVICE OK TO RELEASE    Alkaline Phosphatase 68 38 - 126 U/L    Comment: PATIENT HAS AN IMPELLA DEVICE OK TO RELEASE   Total Bilirubin 7.9 (H) 0.0 - 1.2 mg/dL    Comment: PATIENT HAS AN IMPELLA DEVICE OK TO RELEASE   Bilirubin, Direct 1.9 (H) 0.0 - 0.2 mg/dL    Comment: PATIENT HAS AN IMPELLA DEVICE OK TO RELEASE   Indirect Bilirubin 6.0 (H) 0.3 - 0.9 mg/dL    Comment: Performed at [facility], [address], [place], [place] [address]  T4, free     Status: Abnormal   Collection Time: 07/14/23  3:27 AM  Result Value Ref Range   Free T4 0.54 (L) 0.61 - 1.12 ng/dL    Comment: PATIENT HAS AN IMPELLA DEVICE OK TO RELEASE (NOTE) Biotin ingestion may interfere with free T4 tests. If the results are inconsistent with the TSH level, previous test results, or the clinical presentation, then consider biotin interference. If needed, order repeat testing after stopping biotin. Performed at [facility], [address] [place], [place] [address]  Magnesium     Status: None   Collection Time: 07/14/23  3:27 AM  Result Value Ref Range   Magnesium 2.2 1.7 - 2.4 mg/dL    Comment: PATIENT HAS AN IMPELLA DEVICE OK TO RELEASE Performed at [facility], [address] [place], [place] [address]   APTT     Status: Abnormal   Collection Time: 07/14/23  3:27 AM  Result Value Ref Range   aPTT 58 (H) 24 - 36 seconds    Comment:        IF BASELINE aPTT IS ELEVATED, SUGGEST PATIENT RISK ASSESSMENT BE USED TO DETERMINE APPROPRIATE ANTICOAGULANT THERAPY. Performed at [facility], [address], [place], [place] [address]   Phosphorus     Status: None   Collection Time: 07/14/23  3:27 AM  Result Value Ref Range   Phosphorus 2.5 2.5 - 4.6 mg/dL    Comment: PATIENT HAS AN IMPELLA DEVICE OK TO RELEASE Performed at [facility], [address] [place], [place] [address]   TSH     Status:  Abnormal   Collection Time: 07/14/23  3:27 AM  Result Value Ref Range   TSH <0.010 (L) 0.350 - 4.500 uIU/mL    Comment: Performed by a 3rd Generation assay with a functional sensitivity of <=0.01 uIU/mL. Performed at [facility], [address] [place], [place] [address]   Glucose, capillary     Status: Abnormal   Collection Time: 07/14/23  3:31 AM  Result Value Ref Range   Glucose-Capillary 201 (H) 70 - 99 mg/dL    Comment: Glucose reference range applies only to samples taken after fasting for at least 8 hours.  Prepare RBC (crossmatch)     Status: None   Collection Time: 07/14/23  3:49 AM  Result Value Ref Range   Order Confirmation      ORDER PROCESSED BY BLOOD BANK Performed at [facility], [address] [place], [place] [address]     DG CHEST PORT 1 VIEW Result Date: 07/13/2023 CLINICAL DATA:  Patient receiving ECMO. EXAM: PORTABLE CHEST 1 VIEW COMPARISON:  Chest radiograph from the prior day. FINDINGS: An endotracheal tube terminates in the midthoracic trachea. An Impella device appears unchanged in position. A right internal jugular swans Ganz catheter tip overlies the right pulmonary artery. A left internal jugular central venous catheter tip overlies the superior vena cava. A vascular catheter overlies the inferior cavoatrial junction. An enteric tube enters the stomach and terminates below the field of view. The heart is enlarged. Bilateral layering pleural effusions with associated atelectasis/airspace disease appears similar to prior exam. IMPRESSION: Bilateral layering pleural effusions with associated atelectasis/airspace disease appears similar to prior exam. Electronically Signed   By: Romona Curls M.D.   On: 07/13/2023 11:32   DG CHEST PORT 1 VIEW Result Date: 07/12/2023 CLINICAL DATA:  Check endotracheal tube placement EXAM: PORTABLE CHEST 1 VIEW COMPARISON:  Film from earlier in the same day. FINDINGS: Endotracheal tube and gastric catheter are noted in  satisfactory position. Impella catheter is seen extending into the left ventricle. Swan-Ganz catheter is noted within the right pulmonary artery. Left jugular central venous line is noted at the cavoatrial junction. No pneumothorax is seen. Persistent bibasilar opacities are seen. No other focal abnormality is noted. IMPRESSION: Tubes and lines in satisfactory position. Bibasilar opacities are again seen stable from the recent exam. Electronically Signed   By: Alcide Clever M.D.   On: 07/12/2023 19:25  Review of Systems  Unable to perform ROS: Intubated   Blood pressure (!) 86/73, pulse 81, temperature 98.6 F (37 C), resp. rate 10, height 6\' 4"  (1.93 m), weight 86.6 kg, SpO2 100%. Physical Exam Vitals reviewed.  Constitutional:      Appearance: He is ill-appearing.  HENT:     Head: Normocephalic and atraumatic.  Cardiovascular:     Comments: Impella hum, + murmur Pulmonary:     Breath sounds: Rhonchi present.     Comments: intubated Abdominal:     General: There is no distension.  Skin:    General: Skin is warm and dry.  Neurological:     Comments: Sedated     Assessment/Plan: 38 year old man with severe mitral regurgitation, acute PE, VT/VF arrest, acute renal failure, acute hepatic failure and history of paroxysmal Atrial fibrillation.    S/p arrest on VA ECMO with Impella Cp. Plan is for Impella 5.5 via right axillary approach to allow for removal of groin line, mobilization and support for weaning from ECMO.  I discussed the proposed operation with the patient's fiancee and sister.  I informed them of the high risk nature of the procedure given the clinical situation.  I informed them of the indications, risks, benefits and alternatives. They understand the risks include but are not limited to death, MI, arrhythmias, stroke, bleeding, possible need for tranfusion, major vascular or cardiac injury, infection, as well as the possibility of other unforeseeable  complications.    Loreli Slot 07/14/2023, 8:04 AM

## 2023-07-14 NOTE — Transfer of Care (Signed)
 Immediate Anesthesia Transfer of Care Note  Patient: Aaron [name]  Procedure(s) Performed: PLACEMENT OF RIGHT AXILLARY IMPELLA 5.5 LEFT VENTRICULAR ASSIST DEVICE (Right: Axilla) REMOVAL OF CP RIGHT FEMORAL IMPELLA LEFT VENTRICULAR ASSIST DEVICE (Right: Groin)  Patient Location: ICU  Anesthesia Type:General  Level of Consciousness: sedated and Patient remains intubated per anesthesia plan  Airway & Oxygen Therapy: Patient remains intubated per anesthesia plan and Patient placed on Ventilator (see vital sign flow sheet for setting)  Post-op Assessment: Report given to RN and Post -op Vital signs reviewed and stable  Post vital signs: Reviewed and stable  Last Vitals:  Vitals Value Taken Time  BP 108/80   Temp    Pulse 74 07/14/23 1358  Resp 25 07/14/23 1358  SpO2 95 % 07/14/23 1358  Vitals shown include unfiled device data.  Last Pain:  Vitals:   07/14/23 1028  TempSrc: Core  PainSc:          Complications: No notable events documented.

## 2023-07-14 NOTE — Interval H&P Note (Signed)
 History and Physical Interval Note:  07/14/2023 9:50 AM  Aaron [name]  has presented today for surgery, with the diagnosis of Myocardia support.  The various methods of treatment have been discussed with the patient and family. After consideration of risks, benefits and other options for treatment, the patient has consented to  Procedure(s): PLACEMENT OF IMPELLA LEFT VENTRICULAR ASSIST DEVICE (Right) as a surgical intervention.  The patient's history has been reviewed, patient examined, no change in status, stable for surgery.  I have reviewed the patient's chart and labs.  Questions were answered to the patient's satisfaction.     Loreli Slot

## 2023-07-14 NOTE — H&P (View-Only) (Signed)
 Reason for Consult:Impella placement Referring Physician: Dr. Claire Shown Aaron [name] is an 38 y.o. male.  HPI: 38 yo man with known severe mitral regurgitation, paroxysmal atrial fibrillation, and hypothyroidism.  Presented last week with CP, SOB, N/V.  Ct angio showed bilateral PE- started on IV heparin.  2/27 had VT/ VF arrest requiring 40 minutes of CPR and multiple shocks.  Placed on [facility] ECMO with Impella CP.  Remains on ECMO/ Impella.  Still on ventilator but has woken up and followed commands.  Past Medical History:  Diagnosis Date   Atrial fibrillation [facility])    Hyperthyroidism    Mitral regurgitation    NSTEMI (non-ST elevated myocardial infarction) [facility])     Past Surgical History:  Procedure Laterality Date   ECMO CANNULATION N/A 07/10/2023   Procedure: ECMO CANNULATION;  Surgeon: Dolores Patty, MD;  Location: [facility] INV[facility] CV LAB;  Service: Cardiovascular;  Laterality: N/A;   LEFT HEART CATH AND CORONARY ANGIOGRAPHY N/A 10/22/2021   Procedure: LEFT HEART CATH AND CORONARY ANGIOGRAPHY;  Surgeon: Yvonne Kendall, MD;  Location: [facility] LAB;  Service: Cardiovascular;  Laterality: N/A;   VENTRICULAR ASSIST DEVICE INSERTION N/A 07/10/2023   Procedure: VENTRICULAR ASSIST DEVICE INSERTION;  Surgeon: Dolores Patty, MD;  Location: [facility];  Service: Cardiovascular;  Laterality: N/A;    Family History  Problem Relation Age of Onset   Heart disease Other     Social History:  reports that he has never smoked. He has never used smokeless tobacco. He reports current drug use. Frequency: 3.00 times per week. Drug: Marijuana. He reports that he does not drink alcohol.  Allergies: No Known Allergies  Medications: Scheduled:  sodium chloride   Intravenous Once   Chlorhexidine Gluconate Cloth  6 each Topical Daily   clonazePAM  1 mg Per Tube BID   docusate  100 mg Per Tube BID   fentaNYL (SUBLIMAZE) injection  50 mcg Intravenous Once   folic acid  1 mg Per Tube  Daily   hydrocortisone sod succinate (SOLU-CORTEF) inj  100 mg Intravenous Q8H   insulin aspart  0-20 Units Subcutaneous Q4H   insulin aspart  4 Units Subcutaneous Q4H   methimazole  15 mg Per Tube Q8H   metoCLOPramide (REGLAN) injection  10 mg Intravenous Q8H   multivitamin  1 tablet Per Tube QHS   mouth rinse  15 mL Mouth Rinse Q2H   oxyCODONE  5 mg Per Tube Q6H   pantoprazole (PROTONIX) IV  40 mg Intravenous QHS   polyethylene glycol  17 g Per Tube Daily   QUEtiapine  50 mg Per Tube BID   sodium bicarbonate  50 mEq Intravenous Once   sodium chloride flush  3 mL Intravenous Q12H   thiamine  100 mg Per Tube Daily   Or   thiamine  100 mg Intravenous Daily    Results for orders placed or performed during the hospital encounter of 07/09/23 (from the past 48 hours)  I-STAT, chem 8     Status: Abnormal   Collection Time: 07/12/23  8:20 AM  Result Value Ref Range   Sodium 140 135 - 145 mmol/L   Potassium 3.3 (L) 3.5 - 5.1 mmol/L   Chloride 92 (L) 98 - 111 mmol/L   BUN 34 (H) 6 - 20 mg/dL   Creatinine, Ser 5.62 (H) 0.61 - 1.24 mg/dL   Glucose, Bld 130 (H) 70 - 99 mg/dL    Comment: Glucose reference range applies only to samples  taken after fasting for at least 8 hours.   Calcium, Ion 0.41 (LL) 1.15 - 1.40 mmol/L   TCO2 32 22 - 32 mmol/L   Hemoglobin 9.9 (L) 13.0 - 17.0 g/dL   HCT 57.8 (L) 46.9 - 62.9 %   Comment NOTIFIED PHYSICIAN   Prepare RBC (crossmatch)     Status: None   Collection Time: 07/12/23  9:30 AM  Result Value Ref Range   Order Confirmation      ORDER PROCESSED BY BLOOD BANK Performed at [facility], [address] [place], [place] [address]   I-STAT 7, (LYTES, BLD GAS, ICA, H+H)     Status: Abnormal   Collection Time: 07/12/23 10:04 AM  Result Value Ref Range   pH, Arterial 7.533 (H) 7.35 - 7.45   pCO2 arterial 39.0 32 - 48 mmHg   pO2, Arterial 82 (L) 83 - 108 mmHg   Bicarbonate 32.9 (H) 20.0 - 28.0 mmol/L   TCO2 34 (H) 22 - 32 mmol/L   O2 Saturation  97 %   Acid-Base Excess 9.0 (H) 0.0 - 2.0 mmol/L   Sodium 137 135 - 145 mmol/L   Potassium 4.0 3.5 - 5.1 mmol/L   Calcium, Ion 1.10 (L) 1.15 - 1.40 mmol/L   HCT 25.0 (L) 39.0 - 52.0 %   Hemoglobin 8.5 (L) 13.0 - 17.0 g/dL   Patient temperature 32.4 C    Sample type ARTERIAL   I-STAT, chem 8     Status: Abnormal   Collection Time: 07/12/23 10:06 AM  Result Value Ref Range   Sodium 140 135 - 145 mmol/L   Potassium 3.9 3.5 - 5.1 mmol/L   Chloride 92 (L) 98 - 111 mmol/L   BUN 38 (H) 6 - 20 mg/dL   Creatinine, Ser 4.01 (H) 0.61 - 1.24 mg/dL   Glucose, Bld 027 (H) 70 - 99 mg/dL    Comment: Glucose reference range applies only to samples taken after fasting for at least 8 hours.   Calcium, Ion 0.35 (LL) 1.15 - 1.40 mmol/L   TCO2 34 (H) 22 - 32 mmol/L   Hemoglobin 9.2 (L) 13.0 - 17.0 g/dL   HCT 25.3 (L) 66.4 - 40.3 %   Comment NOTIFIED PHYSICIAN   APTT     Status: Abnormal   Collection Time: 07/12/23 11:55 AM  Result Value Ref Range   aPTT 62 (H) 24 - 36 seconds    Comment:        IF BASELINE aPTT IS ELEVATED, SUGGEST PATIENT RISK ASSESSMENT BE USED TO DETERMINE APPROPRIATE ANTICOAGULANT THERAPY. Performed at [facility], [address] [place], [place] [address]   Glucose, capillary     Status: Abnormal   Collection Time: 07/12/23 11:56 AM  Result Value Ref Range   Glucose-Capillary 199 (H) 70 - 99 mg/dL    Comment: Glucose reference range applies only to samples taken after fasting for at least 8 hours.  I-STAT 7, (LYTES, BLD GAS, ICA, H+H)     Status: Abnormal   Collection Time: 07/12/23 11:58 AM  Result Value Ref Range   pH, Arterial 7.506 (H) 7.35 - 7.45   pCO2 arterial 43.8 32 - 48 mmHg   pO2, Arterial 70 (L) 83 - 108 mmHg   Bicarbonate 34.7 (H) 20.0 - 28.0 mmol/L   TCO2 36 (H) 22 - 32 mmol/L   O2 Saturation 95 %   Acid-Base Excess 11.0 (H) 0.0 - 2.0 mmol/L   Sodium 137 135 - 145 mmol/L  Potassium 4.1 3.5 - 5.1 mmol/L   Calcium, Ion 1.14 (L) 1.15 - 1.40  mmol/L   HCT 26.0 (L) 39.0 - 52.0 %   Hemoglobin 8.8 (L) 13.0 - 17.0 g/dL   Patient temperature 16.1 C    Sample type ARTERIAL   CG4 I-STAT (Lactic acid)     Status: Abnormal   Collection Time: 07/12/23 11:59 AM  Result Value Ref Range   Lactic Acid, Venous 2.0 (HH) 0.5 - 1.9 mmol/L  I-STAT, chem 8     Status: Abnormal   Collection Time: 07/12/23 12:07 PM  Result Value Ref Range   Sodium 141 135 - 145 mmol/L   Potassium 4.1 3.5 - 5.1 mmol/L   Chloride 93 (L) 98 - 111 mmol/L   BUN 40 (H) 6 - 20 mg/dL   Creatinine, Ser 0.96 (H) 0.61 - 1.24 mg/dL   Glucose, Bld 045 (H) 70 - 99 mg/dL    Comment: Glucose reference range applies only to samples taken after fasting for at least 8 hours.   Calcium, Ion 0.47 (LL) 1.15 - 1.40 mmol/L   TCO2 35 (H) 22 - 32 mmol/L   Hemoglobin 9.9 (L) 13.0 - 17.0 g/dL   HCT 40.9 (L) 81.1 - 91.4 %   Comment NOTIFIED PHYSICIAN   I-STAT 7, (LYTES, BLD GAS, ICA, H+H)     Status: Abnormal   Collection Time: 07/12/23  1:56 PM  Result Value Ref Range   pH, Arterial 7.516 (H) 7.35 - 7.45   pCO2 arterial 44.4 32 - 48 mmHg   pO2, Arterial 87 83 - 108 mmHg   Bicarbonate 36.1 (H) 20.0 - 28.0 mmol/L   TCO2 37 (H) 22 - 32 mmol/L   O2 Saturation 98 %   Acid-Base Excess 12.0 (H) 0.0 - 2.0 mmol/L   Sodium 138 135 - 145 mmol/L   Potassium 4.1 3.5 - 5.1 mmol/L   Calcium, Ion 1.07 (L) 1.15 - 1.40 mmol/L   HCT 26.0 (L) 39.0 - 52.0 %   Hemoglobin 8.8 (L) 13.0 - 17.0 g/dL   Patient temperature 78.2 C    Sample type ARTERIAL   I-STAT, chem 8     Status: Abnormal   Collection Time: 07/12/23  1:59 PM  Result Value Ref Range   Sodium 140 135 - 145 mmol/L   Potassium 4.0 3.5 - 5.1 mmol/L   Chloride 91 (L) 98 - 111 mmol/L   BUN 37 (H) 6 - 20 mg/dL   Creatinine, Ser 9.56 (H) 0.61 - 1.24 mg/dL   Glucose, Bld 213 (H) 70 - 99 mg/dL    Comment: Glucose reference range applies only to samples taken after fasting for at least 8 hours.   Calcium, Ion 0.31 (LL) 1.15 - 1.40 mmol/L    TCO2 35 (H) 22 - 32 mmol/L   Hemoglobin 9.5 (L) 13.0 - 17.0 g/dL   HCT 08.6 (L) 57.8 - 46.9 %   Comment NOTIFIED PHYSICIAN   Cooxemetry Panel (carboxy, met, total hgb, O2 sat)     Status: Abnormal   Collection Time: 07/12/23  3:59 PM  Result Value Ref Range   Total hemoglobin 7.7 (L) 12.0 - 16.0 g/dL   O2 Saturation 62.9 %   Carboxyhemoglobin 6.1 (HH) 0.5 - 1.5 %    Comment: CRITICAL RESULT CALLED TO, READ BACK BY AND VERIFIED WITH: SARA LAMBERTH BY HUGHESCH AT 1626PM [id number]    Methemoglobin 3.2 (H) 0.0 - 1.5 %    Comment: Performed at [facility] Lab, [address] [place], [place] [address]  CBC     Status: Abnormal   Collection Time: 07/12/23  4:00 PM  Result Value Ref Range   WBC 29.9 (H) 4.0 - 10.5 K/uL   RBC 3.08 (L) 4.22 - 5.81 MIL/uL   Hemoglobin 7.8 (L) 13.0 - 17.0 g/dL    Comment: Reticulocyte Hemoglobin testing may be clinically indicated, consider ordering this additional test [id number]    HCT 23.0 (L) 39.0 - 52.0 %   MCV 74.7 (L) 80.0 - 100.0 fL   MCH 25.3 (L) 26.0 - 34.0 pg   MCHC 33.9 30.0 - 36.0 g/dL   RDW 62.9 (H) 52.8 - 41.3 %   Platelets 91 (L) 150 - 400 K/uL    Comment: REPEATED TO VERIFY   nRBC 1.6 (H) 0.0 - 0.2 %    Comment: Performed at [facility], [address], [place], [place] [address]  Renal function panel (daily at 1600)     Status: Abnormal   Collection Time: 07/12/23  4:00 PM  Result Value Ref Range   Sodium 137 135 - 145 mmol/L   Potassium 4.4 3.5 - 5.1 mmol/L   Chloride 97 (L) 98 - 111 mmol/L   CO2 28 22 - 32 mmol/L   Glucose, Bld 204 (H) 70 - 99 mg/dL    Comment: Glucose reference range applies only to samples taken after fasting for at least 8 hours.   BUN 35 (H) 6 - 20 mg/dL   Creatinine, Ser 0.27 (H) 0.61 - 1.24 mg/dL   Calcium 8.9 8.9 - 25.3 mg/dL   Phosphorus 4.5 2.5 - 4.6 mg/dL   Albumin 1.9 (L) 3.5 - 5.0 g/dL   GFR, Estimated 26 (L) >60 mL/min    Comment: (NOTE) Calculated using the CKD-EPI Creatinine Equation  (2021)    Anion gap 12 5 - 15    Comment: Performed at [facility] Lab, [address] [place], [place] [address]  Hepatic function panel     Status: Abnormal   Collection Time: 07/12/23  4:00 PM  Result Value Ref Range   Total Protein 4.4 (L) 6.5 - 8.1 g/dL   Albumin 1.9 (L) 3.5 - 5.0 g/dL   AST 3,474 (H) 15 - 41 U/L   ALT 382 (H) 0 - 44 U/L   Alkaline Phosphatase 78 38 - 126 U/L   Total Bilirubin 7.4 (H) 0.0 - 1.2 mg/dL   Bilirubin, Direct 2.6 (H) 0.0 - 0.2 mg/dL   Indirect Bilirubin 4.8 (H) 0.3 - 0.9 mg/dL    Comment: Performed at [facility] Lab, [address], [place], [place] [address]  Lactate dehydrogenase     Status: Abnormal   Collection Time: 07/12/23  4:00 PM  Result Value Ref Range   LDH >2,500 (H) 98 - 192 U/L    Comment: RESULT CONFIRMED BY AUTOMATED DILUTION Performed at [facility] Lab, [address] [place], [place] [address]   APTT     Status: Abnormal   Collection Time: 07/12/23  4:00 PM  Result Value Ref Range   aPTT 61 (H) 24 - 36 seconds    Comment:        IF BASELINE aPTT IS ELEVATED, SUGGEST PATIENT RISK ASSESSMENT BE USED TO DETERMINE APPROPRIATE ANTICOAGULANT THERAPY. Performed at [facility], [address], [place], [place] [address]   Magnesium     Status: None   Collection Time: 07/12/23  4:00 PM  Result Value Ref Range  Magnesium 2.2 1.7 - 2.4 mg/dL    Comment: Performed at [facility] Lab, [address] [place], [place] [address]  Glucose, capillary     Status: Abnormal   Collection Time: 07/12/23  4:02 PM  Result Value Ref Range   Glucose-Capillary 204 (H) 70 - 99 mg/dL    Comment: Glucose reference range applies only to samples taken after fasting for at least 8 hours.  I-STAT 7, (LYTES, BLD GAS, ICA, H+H)     Status: Abnormal   Collection Time: 07/12/23  4:04 PM  Result Value Ref Range   pH, Arterial 7.521 (H) 7.35 - 7.45   pCO2 arterial 44.7 32 - 48 mmHg   pO2, Arterial 138 (H) 83 - 108 mmHg   Bicarbonate  36.7 (H) 20.0 - 28.0 mmol/L   TCO2 38 (H) 22 - 32 mmol/L   O2 Saturation 99 %   Acid-Base Excess 13.0 (H) 0.0 - 2.0 mmol/L   Sodium 138 135 - 145 mmol/L   Potassium 4.3 3.5 - 5.1 mmol/L   Calcium, Ion 1.06 (L) 1.15 - 1.40 mmol/L   HCT 26.0 (L) 39.0 - 52.0 %   Hemoglobin 8.8 (L) 13.0 - 17.0 g/dL   Patient temperature 29.5 C    Sample type ARTERIAL   I-STAT, chem 8     Status: Abnormal   Collection Time: 07/12/23  4:11 PM  Result Value Ref Range   Sodium 140 135 - 145 mmol/L   Potassium 4.1 3.5 - 5.1 mmol/L   Chloride 90 (L) 98 - 111 mmol/L   BUN 37 (H) 6 - 20 mg/dL   Creatinine, Ser 6.21 (H) 0.61 - 1.24 mg/dL   Glucose, Bld 308 (H) 70 - 99 mg/dL    Comment: Glucose reference range applies only to samples taken after fasting for at least 8 hours.   Calcium, Ion 0.33 (LL) 1.15 - 1.40 mmol/L   TCO2 35 (H) 22 - 32 mmol/L   Hemoglobin 9.2 (L) 13.0 - 17.0 g/dL   HCT 65.7 (L) 84.6 - 96.2 %   Comment NOTIFIED PHYSICIAN   Prepare RBC (crossmatch)     Status: None   Collection Time: 07/12/23  5:17 PM  Result Value Ref Range   Order Confirmation      ORDER PROCESSED BY BLOOD BANK Performed at [facility], [address] [place], [place] [address]   I-STAT 7, (LYTES, BLD GAS, ICA, H+H)     Status: Abnormal   Collection Time: 07/12/23  8:24 PM  Result Value Ref Range   pH, Arterial 7.499 (H) 7.35 - 7.45   pCO2 arterial 49.4 (H) 32 - 48 mmHg   pO2, Arterial 181 (H) 83 - 108 mmHg   Bicarbonate 38.5 (H) 20.0 - 28.0 mmol/L   TCO2 40 (H) 22 - 32 mmol/L   O2 Saturation 100 %   Acid-Base Excess 14.0 (H) 0.0 - 2.0 mmol/L   Sodium 138 135 - 145 mmol/L   Potassium 4.4 3.5 - 5.1 mmol/L   Calcium, Ion 1.06 (L) 1.15 - 1.40 mmol/L   HCT 26.0 (L) 39.0 - 52.0 %   Hemoglobin 8.8 (L) 13.0 - 17.0 g/dL   Patient temperature 13.2 C    Sample type ARTERIAL   Glucose, capillary     Status: Abnormal   Collection Time: 07/12/23  8:27 PM  Result Value Ref Range   Glucose-Capillary 186 (H) 70 -  99 mg/dL    Comment: Glucose reference range applies only to samples  taken after fasting for at least 8 hours.  I-STAT, chem 8     Status: Abnormal   Collection Time: 07/12/23  8:28 PM  Result Value Ref Range   Sodium 139 135 - 145 mmol/L   Potassium 4.1 3.5 - 5.1 mmol/L   Chloride 90 (L) 98 - 111 mmol/L   BUN 37 (H) 6 - 20 mg/dL   Creatinine, Ser 1.61 (H) 0.61 - 1.24 mg/dL   Glucose, Bld 096 (H) 70 - 99 mg/dL    Comment: Glucose reference range applies only to samples taken after fasting for at least 8 hours.   Calcium, Ion 0.34 (LL) 1.15 - 1.40 mmol/L   TCO2 36 (H) 22 - 32 mmol/L   Hemoglobin 9.9 (L) 13.0 - 17.0 g/dL   HCT 04.5 (L) 40.9 - 81.1 %   Comment NOTIFIED PHYSICIAN   I-STAT 7, (LYTES, BLD GAS, ICA, H+H)     Status: Abnormal   Collection Time: 07/12/23 10:15 PM  Result Value Ref Range   pH, Arterial 7.494 (H) 7.35 - 7.45   pCO2 arterial 50.8 (H) 32 - 48 mmHg   pO2, Arterial 198 (H) 83 - 108 mmHg   Bicarbonate 39.2 (H) 20.0 - 28.0 mmol/L   TCO2 41 (H) 22 - 32 mmol/L   O2 Saturation 100 %   Acid-Base Excess 14.0 (H) 0.0 - 2.0 mmol/L   Sodium 139 135 - 145 mmol/L   Potassium 4.1 3.5 - 5.1 mmol/L   Calcium, Ion 1.03 (L) 1.15 - 1.40 mmol/L   HCT 25.0 (L) 39.0 - 52.0 %   Hemoglobin 8.5 (L) 13.0 - 17.0 g/dL   Patient temperature 91.4 C    Sample type ARTERIAL   I-STAT 7, (LYTES, BLD GAS, ICA, H+H)     Status: Abnormal   Collection Time: 07/13/23 12:37 AM  Result Value Ref Range   pH, Arterial 7.574 (H) 7.35 - 7.45   pCO2 arterial 41.4 32 - 48 mmHg   pO2, Arterial 136 (H) 83 - 108 mmHg   Bicarbonate 38.3 (H) 20.0 - 28.0 mmol/L   TCO2 40 (H) 22 - 32 mmol/L   O2 Saturation 99 %   Acid-Base Excess 15.0 (H) 0.0 - 2.0 mmol/L   Sodium 138 135 - 145 mmol/L   Potassium 4.0 3.5 - 5.1 mmol/L   Calcium, Ion 1.07 (L) 1.15 - 1.40 mmol/L   HCT 25.0 (L) 39.0 - 52.0 %   Hemoglobin 8.5 (L) 13.0 - 17.0 g/dL   Patient temperature 78.2 C    Sample type ARTERIAL   Glucose, capillary      Status: Abnormal   Collection Time: 07/13/23 12:41 AM  Result Value Ref Range   Glucose-Capillary 217 (H) 70 - 99 mg/dL    Comment: Glucose reference range applies only to samples taken after fasting for at least 8 hours.  I-STAT, chem 8     Status: Abnormal   Collection Time: 07/13/23 12:41 AM  Result Value Ref Range   Sodium 140 135 - 145 mmol/L   Potassium 3.9 3.5 - 5.1 mmol/L   Chloride 90 (L) 98 - 111 mmol/L   BUN 37 (H) 6 - 20 mg/dL   Creatinine, Ser 9.56 (H) 0.61 - 1.24 mg/dL   Glucose, Bld 213 (H) 70 - 99 mg/dL    Comment: Glucose reference range applies only to samples taken after fasting for at least 8 hours.   Calcium, Ion 0.34 (LL) 1.15 - 1.40 mmol/L   TCO2 35 (H) 22 -  32 mmol/L   Hemoglobin 9.9 (L) 13.0 - 17.0 g/dL   HCT 96.0 (L) 45.4 - 09.8 %   Comment NOTIFIED PHYSICIAN   CBC     Status: Abnormal   Collection Time: 07/13/23  4:03 AM  Result Value Ref Range   WBC 31.1 (H) 4.0 - 10.5 K/uL   RBC 3.06 (L) 4.22 - 5.81 MIL/uL   Hemoglobin 8.0 (L) 13.0 - 17.0 g/dL    Comment: Reticulocyte Hemoglobin testing may be clinically indicated, consider ordering this additional test [id number]    HCT 22.9 (L) 39.0 - 52.0 %   MCV 74.8 (L) 80.0 - 100.0 fL   MCH 26.1 26.0 - 34.0 pg   MCHC 34.9 30.0 - 36.0 g/dL   RDW 95.6 (H) 21.3 - 08.6 %   Platelets 88 (L) 150 - 400 K/uL    Comment: REPEATED TO VERIFY   nRBC 1.4 (H) 0.0 - 0.2 %    Comment: Performed at [facility], [address] [place], [place] [address]  Basic metabolic panel     Status: Abnormal   Collection Time: 07/13/23  4:03 AM  Result Value Ref Range   Sodium 138 135 - 145 mmol/L   Potassium 3.9 3.5 - 5.1 mmol/L    Comment: HEMOLYSIS AT THIS LEVEL MAY AFFECT RESULT PATIENT HAS IMPELLA DEVICE    Chloride 93 (L) 98 - 111 mmol/L   CO2 32 22 - 32 mmol/L   Glucose, Bld 223 (H) 70 - 99 mg/dL    Comment: Glucose reference range applies only to samples taken after fasting for at least 8 hours.   BUN 36 (H) 6  - 20 mg/dL   Creatinine, Ser 9.62 (H) 0.61 - 1.24 mg/dL   Calcium 9.5 8.9 - 95.2 mg/dL   GFR, Estimated 26 (L) >60 mL/min    Comment: (NOTE) Calculated using the CKD-EPI Creatinine Equation (2021)    Anion gap 13 5 - 15    Comment: Performed at [facility] Lab, [address] [place], [place] [address]  Lactate dehydrogenase     Status: Abnormal   Collection Time: 07/13/23  4:03 AM  Result Value Ref Range   LDH >2,500 (H) 98 - 192 U/L    Comment: HEMOLYSIS AT THIS LEVEL MAY AFFECT RESULT PATIENT HAS IMPELLA DEVICE Performed at [facility], [address] [place], [place] [address]   Protime-INR     Status: Abnormal   Collection Time: 07/13/23  4:03 AM  Result Value Ref Range   Prothrombin Time 18.6 (H) 11.4 - 15.2 seconds   INR 1.5 (H) 0.8 - 1.2    Comment: (NOTE) INR goal varies based on device and disease states. Performed at [facility], [address] [place], [place] [address]   Fibrinogen     Status: None   Collection Time: 07/13/23  4:03 AM  Result Value Ref Range   Fibrinogen 275 210 - 475 mg/dL    Comment: (NOTE) Fibrinogen results may be underestimated in patients receiving thrombolytic therapy. Performed at [facility], [address], [place], [place] [address]   Hepatic function panel     Status: Abnormal   Collection Time: 07/13/23  4:03 AM  Result Value Ref Range   Total Protein 4.7 (L) 6.5 - 8.1 g/dL   Albumin 2.0 (L) 3.5 - 5.0 g/dL   AST 347 (H) 15 - 41 U/L    Comment: HEMOLYSIS AT THIS LEVEL MAY AFFECT RESULT PATIENT  HAS IMPELLA DEVICE    ALT 355 (H) 0 - 44 U/L    Comment: HEMOLYSIS AT THIS LEVEL MAY AFFECT RESULT PATIENT HAS IMPELLA DEVICE RESULT CONFIRMED BY MANUAL DILUTION    Alkaline Phosphatase 74 38 - 126 U/L    Comment: HEMOLYSIS AT THIS LEVEL MAY AFFECT RESULT PATIENT HAS IMPELLA DEVICE    Total Bilirubin 7.3 (H) 0.0 - 1.2 mg/dL    Comment: HEMOLYSIS AT THIS LEVEL MAY AFFECT RESULT PATIENT HAS IMPELLA  DEVICE    Bilirubin, Direct 2.3 (H) 0.0 - 0.2 mg/dL    Comment: HEMOLYSIS AT THIS LEVEL MAY AFFECT RESULT PATIENT HAS IMPELLA DEVICE    Indirect Bilirubin 5.0 (H) 0.3 - 0.9 mg/dL    Comment: Performed at [facility] Lab, [address] [place], [place] [address]  T4, free     Status: None   Collection Time: 07/13/23  4:03 AM  Result Value Ref Range   Free T4 0.86 0.61 - 1.12 ng/dL    Comment: HEMOLYSIS AT THIS LEVEL MAY AFFECT RESULT PATIENT HAS IMPELLA DEVICE (NOTE) Biotin ingestion may interfere with free T4 tests. If the results are inconsistent with the TSH level, previous test results, or the clinical presentation, then consider biotin interference. If needed, order repeat testing after stopping biotin. Performed at [facility], [address], [place], [place] [address]   Magnesium     Status: None   Collection Time: 07/13/23  4:03 AM  Result Value Ref Range   Magnesium 2.4 1.7 - 2.4 mg/dL    Comment: HEMOLYSIS AT THIS LEVEL MAY AFFECT RESULT PATIENT HAS IMPELLA DEVICE Performed at [facility] Lab, [address] [place], [place] [address]   APTT     Status: Abnormal   Collection Time: 07/13/23  4:03 AM  Result Value Ref Range   aPTT 60 (H) 24 - 36 seconds    Comment:        IF BASELINE aPTT IS ELEVATED, SUGGEST PATIENT RISK ASSESSMENT BE USED TO DETERMINE APPROPRIATE ANTICOAGULANT THERAPY. Performed at [facility] Lab, [address] [place], [place] [address]   Phosphorus     Status: None   Collection Time: 07/13/23  4:03 AM  Result Value Ref Range   Phosphorus 2.9 2.5 - 4.6 mg/dL    Comment: HEMOLYSIS AT THIS LEVEL MAY AFFECT RESULT PATIENT HAS IMPELLA DEVICE Performed at [facility] Lab, [address], [place], [place] [address]   TSH     Status: Abnormal   Collection Time: 07/13/23  4:03 AM  Result Value Ref Range   TSH <0.010 (L) 0.350 - 4.500 uIU/mL    Comment: Performed by a 3rd Generation assay with a functional sensitivity of  <=0.01 uIU/mL. Performed at [facility] Lab, [address], [place], [place] [address]   Glucose, capillary     Status: Abnormal   Collection Time: 07/13/23  4:07 AM  Result Value Ref Range   Glucose-Capillary 229 (H) 70 - 99 mg/dL    Comment: Glucose reference range applies only to samples taken after fasting for at least 8 hours.  I-STAT 7, (LYTES, BLD GAS, ICA, H+H)     Status: Abnormal   Collection Time: 07/13/23  4:09 AM  Result Value Ref Range   pH, Arterial 7.554 (H) 7.35 - 7.45   pCO2 arterial 45.6 32 - 48 mmHg   pO2, Arterial 85 83 - 108 mmHg   Bicarbonate 40.4 (H) 20.0 - 28.0 mmol/L  TCO2 42 (H) 22 - 32 mmol/L   O2 Saturation 98 %   Acid-Base Excess 16.0 (H) 0.0 - 2.0 mmol/L   Sodium 139 135 - 145 mmol/L   Potassium 3.8 3.5 - 5.1 mmol/L   Calcium, Ion 1.07 (L) 1.15 - 1.40 mmol/L   HCT 24.0 (L) 39.0 - 52.0 %   Hemoglobin 8.2 (L) 13.0 - 17.0 g/dL   Patient temperature 47.4 C    Sample type ARTERIAL   CG4 I-STAT (Lactic acid)     Status: None   Collection Time: 07/13/23  4:13 AM  Result Value Ref Range   Lactic Acid, Venous 1.3 0.5 - 1.9 mmol/L  I-STAT, chem 8     Status: Abnormal   Collection Time: 07/13/23  4:16 AM  Result Value Ref Range   Sodium 140 135 - 145 mmol/L   Potassium 3.7 3.5 - 5.1 mmol/L   Chloride 90 (L) 98 - 111 mmol/L   BUN 38 (H) 6 - 20 mg/dL   Creatinine, Ser 2.59 (H) 0.61 - 1.24 mg/dL   Glucose, Bld 563 (H) 70 - 99 mg/dL    Comment: Glucose reference range applies only to samples taken after fasting for at least 8 hours.   Calcium, Ion 0.34 (LL) 1.15 - 1.40 mmol/L   TCO2 35 (H) 22 - 32 mmol/L   Hemoglobin 10.5 (L) 13.0 - 17.0 g/dL   HCT 87.5 (L) 64.3 - 32.9 %   Comment NOTIFIED PHYSICIAN   Glucose, capillary     Status: Abnormal   Collection Time: 07/13/23  7:32 AM  Result Value Ref Range   Glucose-Capillary 190 (H) 70 - 99 mg/dL    Comment: Glucose reference range applies only to samples taken after fasting for at least 8 hours.   I-STAT 7, (LYTES, BLD GAS, ICA, H+H)     Status: Abnormal   Collection Time: 07/13/23  7:34 AM  Result Value Ref Range   pH, Arterial 7.563 (H) 7.35 - 7.45   pCO2 arterial 45.3 32 - 48 mmHg   pO2, Arterial 72 (L) 83 - 108 mmHg   Bicarbonate 40.9 (H) 20.0 - 28.0 mmol/L   TCO2 42 (H) 22 - 32 mmol/L   O2 Saturation 96 %   Acid-Base Excess 17.0 (H) 0.0 - 2.0 mmol/L   Sodium 140 135 - 145 mmol/L   Potassium 3.9 3.5 - 5.1 mmol/L   Calcium, Ion 1.15 1.15 - 1.40 mmol/L   HCT 24.0 (L) 39.0 - 52.0 %   Hemoglobin 8.2 (L) 13.0 - 17.0 g/dL   Patient temperature 51.8 C    Sample type ARTERIAL   I-STAT 7, (LYTES, BLD GAS, ICA, H+H)     Status: Abnormal   Collection Time: 07/13/23  9:58 AM  Result Value Ref Range   pH, Arterial 7.510 (H) 7.35 - 7.45   pCO2 arterial 49.3 (H) 32 - 48 mmHg   pO2, Arterial 86 83 - 108 mmHg   Bicarbonate 39.4 (H) 20.0 - 28.0 mmol/L   TCO2 41 (H) 22 - 32 mmol/L   O2 Saturation 97 %   Acid-Base Excess 15.0 (H) 0.0 - 2.0 mmol/L   Sodium 139 135 - 145 mmol/L   Potassium 3.8 3.5 - 5.1 mmol/L   Calcium, Ion 1.12 (L) 1.15 - 1.40 mmol/L   HCT 23.0 (L) 39.0 - 52.0 %   Hemoglobin 7.8 (L) 13.0 - 17.0 g/dL   Patient temperature 84.1 C    Sample type ARTERIAL   Glucose, capillary  Status: Abnormal   Collection Time: 07/13/23 11:15 AM  Result Value Ref Range   Glucose-Capillary 166 (H) 70 - 99 mg/dL    Comment: Glucose reference range applies only to samples taken after fasting for at least 8 hours.  I-STAT 7, (LYTES, BLD GAS, ICA, H+H)     Status: Abnormal   Collection Time: 07/13/23  1:11 PM  Result Value Ref Range   pH, Arterial 7.519 (H) 7.35 - 7.45   pCO2 arterial 47.6 32 - 48 mmHg   pO2, Arterial 115 (H) 83 - 108 mmHg   Bicarbonate 38.9 (H) 20.0 - 28.0 mmol/L   TCO2 40 (H) 22 - 32 mmol/L   O2 Saturation 99 %   Acid-Base Excess 15.0 (H) 0.0 - 2.0 mmol/L   Sodium 140 135 - 145 mmol/L   Potassium 4.0 3.5 - 5.1 mmol/L   Calcium, Ion 1.07 (L) 1.15 - 1.40 mmol/L    HCT 22.0 (L) 39.0 - 52.0 %   Hemoglobin 7.5 (L) 13.0 - 17.0 g/dL   Patient temperature 14.7 C    Sample type ARTERIAL   I-STAT 7, (LYTES, BLD GAS, ICA, H+H)     Status: Abnormal   Collection Time: 07/13/23  2:48 PM  Result Value Ref Range   pH, Arterial 7.508 (H) 7.35 - 7.45   pCO2 arterial 47.1 32 - 48 mmHg   pO2, Arterial 118 (H) 83 - 108 mmHg   Bicarbonate 37.5 (H) 20.0 - 28.0 mmol/L   TCO2 39 (H) 22 - 32 mmol/L   O2 Saturation 99 %   Acid-Base Excess 13.0 (H) 0.0 - 2.0 mmol/L   Sodium 139 135 - 145 mmol/L   Potassium 3.9 3.5 - 5.1 mmol/L   Calcium, Ion 1.09 (L) 1.15 - 1.40 mmol/L   HCT 22.0 (L) 39.0 - 52.0 %   Hemoglobin 7.5 (L) 13.0 - 17.0 g/dL   Patient temperature 82.9 C    Sample type ARTERIAL   Glucose, capillary     Status: Abnormal   Collection Time: 07/13/23  4:01 PM  Result Value Ref Range   Glucose-Capillary 149 (H) 70 - 99 mg/dL    Comment: Glucose reference range applies only to samples taken after fasting for at least 8 hours.  I-STAT 7, (LYTES, BLD GAS, ICA, H+H)     Status: Abnormal   Collection Time: 07/13/23  4:03 PM  Result Value Ref Range   pH, Arterial 7.522 (H) 7.35 - 7.45   pCO2 arterial 48.7 (H) 32 - 48 mmHg   pO2, Arterial 133 (H) 83 - 108 mmHg   Bicarbonate 40.0 (H) 20.0 - 28.0 mmol/L   TCO2 41 (H) 22 - 32 mmol/L   O2 Saturation 99 %   Acid-Base Excess 16.0 (H) 0.0 - 2.0 mmol/L   Sodium 139 135 - 145 mmol/L   Potassium 4.0 3.5 - 5.1 mmol/L   Calcium, Ion 1.13 (L) 1.15 - 1.40 mmol/L   HCT 23.0 (L) 39.0 - 52.0 %   Hemoglobin 7.8 (L) 13.0 - 17.0 g/dL   Patient temperature 56.2 C    Sample type ARTERIAL   CBC     Status: Abnormal   Collection Time: 07/13/23  4:07 PM  Result Value Ref Range   WBC 32.1 (H) 4.0 - 10.5 K/uL   RBC 2.68 (L) 4.22 - 5.81 MIL/uL   Hemoglobin 7.4 (L) 13.0 - 17.0 g/dL    Comment: Reticulocyte Hemoglobin testing may be clinically indicated, consider ordering this additional test [id number]  HCT 20.6 (L) 39.0 -  52.0 %   MCV 76.9 (L) 80.0 - 100.0 fL   MCH 27.6 26.0 - 34.0 pg   MCHC 35.9 30.0 - 36.0 g/dL   RDW 16.1 (H) 09.6 - 04.5 %   Platelets 86 (L) 150 - 400 K/uL    Comment: REPEATED TO VERIFY   nRBC 1.2 (H) 0.0 - 0.2 %    Comment: Performed at [facility], [address], [place], [place] [address]  Basic metabolic panel     Status: Abnormal   Collection Time: 07/13/23  4:07 PM  Result Value Ref Range   Sodium 141 135 - 145 mmol/L   Potassium 4.0 3.5 - 5.1 mmol/L    Comment: HEMOLYSIS AT THIS LEVEL MAY AFFECT RESULT   Chloride 94 (L) 98 - 111 mmol/L   CO2 34 (H) 22 - 32 mmol/L   Glucose, Bld 168 (H) 70 - 99 mg/dL    Comment: Glucose reference range applies only to samples taken after fasting for at least 8 hours.   BUN 37 (H) 6 - 20 mg/dL   Creatinine, Ser 1.91 (H) 0.61 - 1.24 mg/dL   Calcium 9.3 8.9 - 47.8 mg/dL   GFR, Estimated 24 (L) >60 mL/min    Comment: (NOTE) Calculated using the CKD-EPI Creatinine Equation (2021)    Anion gap 13 5 - 15    Comment: Performed at [facility], [address] [place], [place] [address]  APTT     Status: Abnormal   Collection Time: 07/13/23  4:07 PM  Result Value Ref Range   aPTT 61 (H) 24 - 36 seconds    Comment:        IF BASELINE aPTT IS ELEVATED, SUGGEST PATIENT RISK ASSESSMENT BE USED TO DETERMINE APPROPRIATE ANTICOAGULANT THERAPY. Performed at [facility], [address] [place], [place] [address]   I-STAT, [place] 8     Status: Abnormal   Collection Time: 07/13/23  4:08 PM  Result Value Ref Range   Sodium 138 135 - 145 mmol/L   Potassium 4.0 3.5 - 5.1 mmol/L   Chloride 92 (L) 98 - 111 mmol/L   BUN 39 (H) 6 - 20 mg/dL   Creatinine, Ser 5.78 (H) 0.61 - 1.24 mg/dL   Glucose, Bld 469 (H) 70 - 99 mg/dL    Comment: Glucose reference range applies only to samples taken after fasting for at least 8 hours.   Calcium, Ion 1.15 1.15 - 1.40 mmol/L   TCO2 37 (H) 22 - 32 mmol/L   Hemoglobin 9.5 (L) 13.0 - 17.0 g/dL   HCT  62.9 (L) 52.8 - 52.0 %  I-STAT, chem 8     Status: Abnormal   Collection Time: 07/13/23  4:35 PM  Result Value Ref Range   Sodium 141 135 - 145 mmol/L   Potassium 3.8 3.5 - 5.1 mmol/L   Chloride 88 (L) 98 - 111 mmol/L   BUN 33 (H) 6 - 20 mg/dL   Creatinine, Ser 4.13 (H) 0.61 - 1.24 mg/dL   Glucose, Bld 244 (H) 70 - 99 mg/dL    Comment: Glucose reference range applies only to samples taken after fasting for at least 8 hours.   Calcium, Ion 0.33 (LL) 1.15 - 1.40 mmol/L   TCO2 36 (H) 22 - 32 mmol/L   Hemoglobin 9.2 (L) 13.0 - 17.0 g/dL   HCT 01.0 (L) 27.2 - 53.6 %   Comment NOTIFIED PHYSICIAN   Type and  screen [facility]     Status: None (Preliminary result)   Collection Time: 07/13/23  4:37 PM  Result Value Ref Range   ABO/RH(D) O POS    Antibody Screen NEG    Sample Expiration 07/16/2023,2359    Unit Number [id number]    Blood Component Type RED CELLS,LR    Unit division 00    Status of Unit ALLOCATED    Transfusion Status OK TO TRANSFUSE    Crossmatch Result Compatible    Unit Number [id number]    Blood Component Type RED CELLS,LR    Unit division 00    Status of Unit ALLOCATED    Transfusion Status OK TO TRANSFUSE    Crossmatch Result Compatible    Unit Number [id number]    Blood Component Type RED CELLS,LR    Unit division 00    Status of Unit ALLOCATED    Transfusion Status OK TO TRANSFUSE    Crossmatch Result Compatible    Unit Number [id number]    Blood Component Type RED CELLS,LR    Unit division 00    Status of Unit ISSUED    Transfusion Status OK TO TRANSFUSE    Crossmatch Result      Compatible Performed at [facility], [address] [place], [place] [address]    Unit Number [id number]    Blood Component Type RED CELLS,LR    Unit division 00    Status of Unit ALLOCATED    Transfusion Status OK TO TRANSFUSE    Crossmatch Result Compatible   Prepare RBC (crossmatch)     Status: None   Collection Time: 07/13/23   5:12 PM  Result Value Ref Range   Order Confirmation      ORDER PROCESSED BY BLOOD BANK Performed at [facility], [address], [place], [place] [address]   I-STAT 7, (LYTES, BLD GAS, ICA, H+H)     Status: Abnormal   Collection Time: 07/13/23  6:05 PM  Result Value Ref Range   pH, Arterial 7.491 (H) 7.35 - 7.45   pCO2 arterial 52.1 (H) 32 - 48 mmHg   pO2, Arterial 105 83 - 108 mmHg   Bicarbonate 39.9 (H) 20.0 - 28.0 mmol/L   TCO2 41 (H) 22 - 32 mmol/L   O2 Saturation 98 %   Acid-Base Excess 15.0 (H) 0.0 - 2.0 mmol/L   Sodium 138 135 - 145 mmol/L   Potassium 3.9 3.5 - 5.1 mmol/L   Calcium, Ion 1.12 (L) 1.15 - 1.40 mmol/L   HCT 21.0 (L) 39.0 - 52.0 %   Hemoglobin 7.1 (L) 13.0 - 17.0 g/dL   Patient temperature 46.2 C    Sample type ARTERIAL   Glucose, capillary     Status: Abnormal   Collection Time: 07/13/23  9:13 PM  Result Value Ref Range   Glucose-Capillary 152 (H) 70 - 99 mg/dL    Comment: Glucose reference range applies only to samples taken after fasting for at least 8 hours.  Glucose, capillary     Status: Abnormal   Collection Time: 07/14/23 12:12 AM  Result Value Ref Range   Glucose-Capillary 186 (H) 70 - 99 mg/dL    Comment: Glucose reference range applies only to samples taken after fasting for at least 8 hours.  I-STAT 7, (LYTES, BLD GAS, ICA, H+H)     Status: Abnormal   Collection Time: 07/14/23 12:14 AM  Result Value Ref Range   pH, Arterial 7.462 (H) 7.35 - 7.45  pCO2 arterial 55.3 (H) 32 - 48 mmHg   pO2, Arterial 67 (L) 83 - 108 mmHg   Bicarbonate 39.6 (H) 20.0 - 28.0 mmol/L   TCO2 41 (H) 22 - 32 mmol/L   O2 Saturation 94 %   Acid-Base Excess 14.0 (H) 0.0 - 2.0 mmol/L   Sodium 138 135 - 145 mmol/L   Potassium 3.9 3.5 - 5.1 mmol/L   Calcium, Ion 1.12 (L) 1.15 - 1.40 mmol/L   HCT 25.0 (L) 39.0 - 52.0 %   Hemoglobin 8.5 (L) 13.0 - 17.0 g/dL   Patient temperature 21.3 C    Sample type ARTERIAL   I-STAT, chem 8     Status: Abnormal   Collection  Time: 07/14/23 12:17 AM  Result Value Ref Range   Sodium 140 135 - 145 mmol/L   Potassium 3.7 3.5 - 5.1 mmol/L   Chloride 87 (L) 98 - 111 mmol/L   BUN 36 (H) 6 - 20 mg/dL   Creatinine, Ser 0.86 (H) 0.61 - 1.24 mg/dL   Glucose, Bld 578 (H) 70 - 99 mg/dL    Comment: Glucose reference range applies only to samples taken after fasting for at least 8 hours.   Calcium, Ion 0.32 (LL) 1.15 - 1.40 mmol/L   TCO2 34 (H) 22 - 32 mmol/L   Hemoglobin 9.9 (L) 13.0 - 17.0 g/dL   HCT 46.9 (L) 62.9 - 52.8 %   Comment NOTIFIED PHYSICIAN   I-STAT 7, (LYTES, BLD GAS, ICA, H+H)     Status: Abnormal   Collection Time: 07/14/23  2:59 AM  Result Value Ref Range   pH, Arterial 7.462 (H) 7.35 - 7.45   pCO2 arterial 55.6 (H) 32 - 48 mmHg   pO2, Arterial 77 (L) 83 - 108 mmHg   Bicarbonate 39.9 (H) 20.0 - 28.0 mmol/L   TCO2 42 (H) 22 - 32 mmol/L   O2 Saturation 96 %   Acid-Base Excess 14.0 (H) 0.0 - 2.0 mmol/L   Sodium 139 135 - 145 mmol/L   Potassium 4.0 3.5 - 5.1 mmol/L   Calcium, Ion 1.14 (L) 1.15 - 1.40 mmol/L   HCT 22.0 (L) 39.0 - 52.0 %   Hemoglobin 7.5 (L) 13.0 - 17.0 g/dL   Patient temperature 41.32 C    Collection site art line    Drawn by Operator    Sample type ARTERIAL   CBC     Status: Abnormal   Collection Time: 07/14/23  3:27 AM  Result Value Ref Range   WBC 32.2 (H) 4.0 - 10.5 K/uL   RBC 2.59 (L) 4.22 - 5.81 MIL/uL   Hemoglobin 7.2 (L) 13.0 - 17.0 g/dL    Comment: Reticulocyte Hemoglobin testing may be clinically indicated, consider ordering this additional test [id number]    HCT 20.1 (L) 39.0 - 52.0 %   MCV 77.6 (L) 80.0 - 100.0 fL   MCH 27.8 26.0 - 34.0 pg   MCHC 35.8 30.0 - 36.0 g/dL   RDW 53.6 (H) 64.4 - 03.4 %   Platelets 87 (L) 150 - 400 K/uL    Comment: REPEATED TO VERIFY   nRBC 1.7 (H) 0.0 - 0.2 %    Comment: Performed at [facility], [address], [place], [place] [address]  Basic metabolic panel     Status: Abnormal   Collection Time: 07/14/23  3:27 AM   Result Value Ref Range   Sodium 137 135 - 145 mmol/L   Potassium 4.0 3.5 - 5.1 mmol/L  Comment: PATIENT HAS AN IMPELLA DEVICE OK TO RELEASE   Chloride 92 (L) 98 - 111 mmol/L   CO2 34 (H) 22 - 32 mmol/L   Glucose, Bld 219 (H) 70 - 99 mg/dL    Comment: Glucose reference range applies only to samples taken after fasting for at least 8 hours.   BUN 46 (H) 6 - 20 mg/dL   Creatinine, Ser 1.61 (H) 0.61 - 1.24 mg/dL   Calcium 8.9 8.9 - 09.6 mg/dL   GFR, Estimated 24 (L) >60 mL/min    Comment: (NOTE) Calculated using the CKD-EPI Creatinine Equation (2021)    Anion gap 11 5 - 15    Comment: Performed at [facility], [address], [place], [place] [address]  Lactate dehydrogenase     Status: Abnormal   Collection Time: 07/14/23  3:27 AM  Result Value Ref Range   LDH >2,500 (H) 98 - 192 U/L    Comment: PATIENT HAS AN IMPELLA DEVICE OK TO RELEASE Performed at [facility], [address], [place], [place] [address]   Protime-INR     Status: Abnormal   Collection Time: 07/14/23  3:27 AM  Result Value Ref Range   Prothrombin Time 17.1 (H) 11.4 - 15.2 seconds   INR 1.4 (H) 0.8 - 1.2    Comment: (NOTE) INR goal varies based on device and disease states. Performed at [facility], [address], [place], [place] [address]   Fibrinogen     Status: None   Collection Time: 07/14/23  3:27 AM  Result Value Ref Range   Fibrinogen 308 210 - 475 mg/dL    Comment: (NOTE) Fibrinogen results may be underestimated in patients receiving thrombolytic therapy. Performed at [facility], [address], [place], [place] [address]   Hepatic function panel     Status: Abnormal   Collection Time: 07/14/23  3:27 AM  Result Value Ref Range   Total Protein NOT CALCULATED 6.5 - 8.1 g/dL    Comment: PATIENT HAS AN IMPELLA DEVICE OK TO RELEASE   Albumin 2.2 (L) 3.5 - 5.0 g/dL   AST 308 (H) 15 - 41 U/L    Comment: PATIENT HAS AN IMPELLA DEVICE OK TO RELEASE   ALT 224 (H) 0 -  44 U/L    Comment: RESULT CONFIRMED BY MANUAL DILUTION PATIENT HAS AN IMPELLA DEVICE OK TO RELEASE    Alkaline Phosphatase 68 38 - 126 U/L    Comment: PATIENT HAS AN IMPELLA DEVICE OK TO RELEASE   Total Bilirubin 7.9 (H) 0.0 - 1.2 mg/dL    Comment: PATIENT HAS AN IMPELLA DEVICE OK TO RELEASE   Bilirubin, Direct 1.9 (H) 0.0 - 0.2 mg/dL    Comment: PATIENT HAS AN IMPELLA DEVICE OK TO RELEASE   Indirect Bilirubin 6.0 (H) 0.3 - 0.9 mg/dL    Comment: Performed at [facility], [address], [place], [place] [address]  T4, free     Status: Abnormal   Collection Time: 07/14/23  3:27 AM  Result Value Ref Range   Free T4 0.54 (L) 0.61 - 1.12 ng/dL    Comment: PATIENT HAS AN IMPELLA DEVICE OK TO RELEASE (NOTE) Biotin ingestion may interfere with free T4 tests. If the results are inconsistent with the TSH level, previous test results, or the clinical presentation, then consider biotin interference. If needed, order repeat testing after stopping biotin. Performed at [facility], [address] [place], [place] [address]  Magnesium     Status: None   Collection Time: 07/14/23  3:27 AM  Result Value Ref Range   Magnesium 2.2 1.7 - 2.4 mg/dL    Comment: PATIENT HAS AN IMPELLA DEVICE OK TO RELEASE Performed at [facility], [address] [place], [place] [address]   APTT     Status: Abnormal   Collection Time: 07/14/23  3:27 AM  Result Value Ref Range   aPTT 58 (H) 24 - 36 seconds    Comment:        IF BASELINE aPTT IS ELEVATED, SUGGEST PATIENT RISK ASSESSMENT BE USED TO DETERMINE APPROPRIATE ANTICOAGULANT THERAPY. Performed at [facility], [address], [place], [place] [address]   Phosphorus     Status: None   Collection Time: 07/14/23  3:27 AM  Result Value Ref Range   Phosphorus 2.5 2.5 - 4.6 mg/dL    Comment: PATIENT HAS AN IMPELLA DEVICE OK TO RELEASE Performed at [facility], [address] [place], [place] [address]   TSH     Status:  Abnormal   Collection Time: 07/14/23  3:27 AM  Result Value Ref Range   TSH <0.010 (L) 0.350 - 4.500 uIU/mL    Comment: Performed by a 3rd Generation assay with a functional sensitivity of <=0.01 uIU/mL. Performed at [facility], [address] [place], [place] [address]   Glucose, capillary     Status: Abnormal   Collection Time: 07/14/23  3:31 AM  Result Value Ref Range   Glucose-Capillary 201 (H) 70 - 99 mg/dL    Comment: Glucose reference range applies only to samples taken after fasting for at least 8 hours.  Prepare RBC (crossmatch)     Status: None   Collection Time: 07/14/23  3:49 AM  Result Value Ref Range   Order Confirmation      ORDER PROCESSED BY BLOOD BANK Performed at [facility], [address] [place], [place] [address]     DG CHEST PORT 1 VIEW Result Date: 07/13/2023 CLINICAL DATA:  Patient receiving ECMO. EXAM: PORTABLE CHEST 1 VIEW COMPARISON:  Chest radiograph from the prior day. FINDINGS: An endotracheal tube terminates in the midthoracic trachea. An Impella device appears unchanged in position. A right internal jugular swans Ganz catheter tip overlies the right pulmonary artery. A left internal jugular central venous catheter tip overlies the superior vena cava. A vascular catheter overlies the inferior cavoatrial junction. An enteric tube enters the stomach and terminates below the field of view. The heart is enlarged. Bilateral layering pleural effusions with associated atelectasis/airspace disease appears similar to prior exam. IMPRESSION: Bilateral layering pleural effusions with associated atelectasis/airspace disease appears similar to prior exam. Electronically Signed   By: Romona Curls M.D.   On: 07/13/2023 11:32   DG CHEST PORT 1 VIEW Result Date: 07/12/2023 CLINICAL DATA:  Check endotracheal tube placement EXAM: PORTABLE CHEST 1 VIEW COMPARISON:  Film from earlier in the same day. FINDINGS: Endotracheal tube and gastric catheter are noted in  satisfactory position. Impella catheter is seen extending into the left ventricle. Swan-Ganz catheter is noted within the right pulmonary artery. Left jugular central venous line is noted at the cavoatrial junction. No pneumothorax is seen. Persistent bibasilar opacities are seen. No other focal abnormality is noted. IMPRESSION: Tubes and lines in satisfactory position. Bibasilar opacities are again seen stable from the recent exam. Electronically Signed   By: Alcide Clever M.D.   On: 07/12/2023 19:25  Review of Systems  Unable to perform ROS: Intubated   Blood pressure (!) 86/73, pulse 81, temperature 98.6 F (37 C), resp. rate 10, height 6\' 4"  (1.93 m), weight 86.6 kg, SpO2 100%. Physical Exam Vitals reviewed.  Constitutional:      Appearance: He is ill-appearing.  HENT:     Head: Normocephalic and atraumatic.  Cardiovascular:     Comments: Impella hum, + murmur Pulmonary:     Breath sounds: Rhonchi present.     Comments: intubated Abdominal:     General: There is no distension.  Skin:    General: Skin is warm and dry.  Neurological:     Comments: Sedated     Assessment/Plan: 38 year old man with severe mitral regurgitation, acute PE, VT/VF arrest, acute renal failure, acute hepatic failure and history of paroxysmal Atrial fibrillation.    S/p arrest on VA ECMO with Impella Cp. Plan is for Impella 5.5 via right axillary approach to allow for removal of groin line, mobilization and support for weaning from ECMO.  I discussed the proposed operation with the patient's fiancee and sister.  I informed them of the high risk nature of the procedure given the clinical situation.  I informed them of the indications, risks, benefits and alternatives. They understand the risks include but are not limited to death, MI, arrhythmias, stroke, bleeding, possible need for tranfusion, major vascular or cardiac injury, infection, as well as the possibility of other unforeseeable  complications.    Loreli Slot 07/14/2023, 8:04 AM

## 2023-07-14 NOTE — Progress Notes (Signed)
 RT NOTE: RT transported patient on ventilator from room 2H22 to OR with no complications. Upon arrival to OR CRNA placed patient on OR ventilator. CRNA will call RT when PT ready to be transported back to room 2H22.

## 2023-07-15 ENCOUNTER — Inpatient Hospital Stay (HOSPITAL_COMMUNITY)

## 2023-07-15 ENCOUNTER — Encounter (HOSPITAL_COMMUNITY): Payer: Self-pay | Admitting: Thoracic Surgery (Cardiothoracic Vascular Surgery)

## 2023-07-15 DIAGNOSIS — I517 Cardiomegaly: Secondary | ICD-10-CM | POA: Diagnosis not present

## 2023-07-15 DIAGNOSIS — Z452 Encounter for adjustment and management of vascular access device: Secondary | ICD-10-CM | POA: Diagnosis not present

## 2023-07-15 DIAGNOSIS — I5032 Chronic diastolic (congestive) heart failure: Secondary | ICD-10-CM

## 2023-07-15 DIAGNOSIS — R918 Other nonspecific abnormal finding of lung field: Secondary | ICD-10-CM | POA: Diagnosis not present

## 2023-07-15 DIAGNOSIS — R079 Chest pain, unspecified: Secondary | ICD-10-CM | POA: Diagnosis not present

## 2023-07-15 DIAGNOSIS — I2699 Other pulmonary embolism without acute cor pulmonale: Secondary | ICD-10-CM | POA: Diagnosis not present

## 2023-07-15 DIAGNOSIS — E875 Hyperkalemia: Secondary | ICD-10-CM | POA: Diagnosis not present

## 2023-07-15 DIAGNOSIS — I469 Cardiac arrest, cause unspecified: Secondary | ICD-10-CM | POA: Diagnosis not present

## 2023-07-15 DIAGNOSIS — I480 Paroxysmal atrial fibrillation: Secondary | ICD-10-CM | POA: Diagnosis not present

## 2023-07-15 DIAGNOSIS — R0602 Shortness of breath: Secondary | ICD-10-CM | POA: Diagnosis not present

## 2023-07-15 DIAGNOSIS — Z4682 Encounter for fitting and adjustment of non-vascular catheter: Secondary | ICD-10-CM | POA: Diagnosis not present

## 2023-07-15 DIAGNOSIS — N170 Acute kidney failure with tubular necrosis: Secondary | ICD-10-CM | POA: Diagnosis not present

## 2023-07-15 DIAGNOSIS — I5021 Acute systolic (congestive) heart failure: Secondary | ICD-10-CM | POA: Diagnosis not present

## 2023-07-15 DIAGNOSIS — Z7189 Other specified counseling: Secondary | ICD-10-CM | POA: Diagnosis not present

## 2023-07-15 DIAGNOSIS — E872 Acidosis, unspecified: Secondary | ICD-10-CM | POA: Diagnosis not present

## 2023-07-15 DIAGNOSIS — Z9911 Dependence on respirator [ventilator] status: Secondary | ICD-10-CM | POA: Diagnosis not present

## 2023-07-15 DIAGNOSIS — R000 Tachycardia, unspecified: Secondary | ICD-10-CM | POA: Diagnosis not present

## 2023-07-15 DIAGNOSIS — E0590 Thyrotoxicosis, unspecified without thyrotoxic crisis or storm: Secondary | ICD-10-CM | POA: Diagnosis not present

## 2023-07-15 DIAGNOSIS — Z515 Encounter for palliative care: Secondary | ICD-10-CM | POA: Diagnosis not present

## 2023-07-15 DIAGNOSIS — J9601 Acute respiratory failure with hypoxia: Secondary | ICD-10-CM | POA: Diagnosis not present

## 2023-07-15 DIAGNOSIS — R570 Cardiogenic shock: Secondary | ICD-10-CM | POA: Diagnosis not present

## 2023-07-15 LAB — POCT I-STAT 7, (LYTES, BLD GAS, ICA,H+H)
Acid-Base Excess: 16 mmol/L — ABNORMAL HIGH (ref 0.0–2.0)
Acid-Base Excess: 16 mmol/L — ABNORMAL HIGH (ref 0.0–2.0)
Acid-Base Excess: 10 mmol/L — ABNORMAL HIGH (ref 0.0–2.0)
Acid-Base Excess: 11 mmol/L — ABNORMAL HIGH (ref 0.0–2.0)
Acid-Base Excess: 10 mmol/L — ABNORMAL HIGH (ref 0.0–2.0)
Acid-Base Excess: 9 mmol/L — ABNORMAL HIGH (ref 0.0–2.0)
Acid-Base Excess: 9 mmol/L — ABNORMAL HIGH (ref 0.0–2.0)
Acid-Base Excess: 9 mmol/L — ABNORMAL HIGH (ref 0.0–2.0)
Acid-Base Excess: 6 mmol/L — ABNORMAL HIGH (ref 0.0–2.0)
Acid-Base Excess: 5 mmol/L — ABNORMAL HIGH (ref 0.0–2.0)
Acid-Base Excess: 5 mmol/L — ABNORMAL HIGH (ref 0.0–2.0)
Bicarbonate: 43 mmol/L — ABNORMAL HIGH (ref 20.0–28.0)
Bicarbonate: 40.5 mmol/L — ABNORMAL HIGH (ref 20.0–28.0)
Bicarbonate: 35.6 mmol/L — ABNORMAL HIGH (ref 20.0–28.0)
Bicarbonate: 35.7 mmol/L — ABNORMAL HIGH (ref 20.0–28.0)
Bicarbonate: 35.5 mmol/L — ABNORMAL HIGH (ref 20.0–28.0)
Bicarbonate: 34.5 mmol/L — ABNORMAL HIGH (ref 20.0–28.0)
Bicarbonate: 34.2 mmol/L — ABNORMAL HIGH (ref 20.0–28.0)
Bicarbonate: 32.9 mmol/L — ABNORMAL HIGH (ref 20.0–28.0)
Bicarbonate: 30.4 mmol/L — ABNORMAL HIGH (ref 20.0–28.0)
Bicarbonate: 29.3 mmol/L — ABNORMAL HIGH (ref 20.0–28.0)
Bicarbonate: 29.4 mmol/L — ABNORMAL HIGH (ref 20.0–28.0)
Calcium, Ion: 1.13 mmol/L — ABNORMAL LOW (ref 1.15–1.40)
Calcium, Ion: 1.06 mmol/L — ABNORMAL LOW (ref 1.15–1.40)
Calcium, Ion: 1.05 mmol/L — ABNORMAL LOW (ref 1.15–1.40)
Calcium, Ion: 1.05 mmol/L — ABNORMAL LOW (ref 1.15–1.40)
Calcium, Ion: 1.03 mmol/L — ABNORMAL LOW (ref 1.15–1.40)
Calcium, Ion: 1.01 mmol/L — ABNORMAL LOW (ref 1.15–1.40)
Calcium, Ion: 1.02 mmol/L — ABNORMAL LOW (ref 1.15–1.40)
Calcium, Ion: 1.02 mmol/L — ABNORMAL LOW (ref 1.15–1.40)
Calcium, Ion: 0.99 mmol/L — ABNORMAL LOW (ref 1.15–1.40)
Calcium, Ion: 1.02 mmol/L — ABNORMAL LOW (ref 1.15–1.40)
Calcium, Ion: 1 mmol/L — ABNORMAL LOW (ref 1.15–1.40)
HCT: 24 % — ABNORMAL LOW (ref 39.0–52.0)
HCT: 24 % — ABNORMAL LOW (ref 39.0–52.0)
HCT: 25 % — ABNORMAL LOW (ref 39.0–52.0)
HCT: 27 % — ABNORMAL LOW (ref 39.0–52.0)
HCT: 25 % — ABNORMAL LOW (ref 39.0–52.0)
HCT: 24 % — ABNORMAL LOW (ref 39.0–52.0)
HCT: 25 % — ABNORMAL LOW (ref 39.0–52.0)
HCT: 26 % — ABNORMAL LOW (ref 39.0–52.0)
HCT: 27 % — ABNORMAL LOW (ref 39.0–52.0)
HCT: 27 % — ABNORMAL LOW (ref 39.0–52.0)
HCT: 26 % — ABNORMAL LOW (ref 39.0–52.0)
Hemoglobin: 8.2 g/dL — ABNORMAL LOW (ref 13.0–17.0)
Hemoglobin: 8.2 g/dL — ABNORMAL LOW (ref 13.0–17.0)
Hemoglobin: 8.5 g/dL — ABNORMAL LOW (ref 13.0–17.0)
Hemoglobin: 9.2 g/dL — ABNORMAL LOW (ref 13.0–17.0)
Hemoglobin: 8.5 g/dL — ABNORMAL LOW (ref 13.0–17.0)
Hemoglobin: 8.2 g/dL — ABNORMAL LOW (ref 13.0–17.0)
Hemoglobin: 8.5 g/dL — ABNORMAL LOW (ref 13.0–17.0)
Hemoglobin: 8.8 g/dL — ABNORMAL LOW (ref 13.0–17.0)
Hemoglobin: 9.2 g/dL — ABNORMAL LOW (ref 13.0–17.0)
Hemoglobin: 9.2 g/dL — ABNORMAL LOW (ref 13.0–17.0)
Hemoglobin: 8.8 g/dL — ABNORMAL LOW (ref 13.0–17.0)
O2 Saturation: 100 %
O2 Saturation: 100 %
O2 Saturation: 99 %
O2 Saturation: 99 %
O2 Saturation: 99 %
O2 Saturation: 97 %
O2 Saturation: 91 %
O2 Saturation: 99 %
O2 Saturation: 99 %
O2 Saturation: 91 %
O2 Saturation: 99 %
Patient temperature: 36.3
Patient temperature: 36.4
Patient temperature: 36.4
Patient temperature: 36.7
Patient temperature: 36.3
Patient temperature: 36.8
Patient temperature: 36.8
Patient temperature: 36.7
Patient temperature: 36.7
Patient temperature: 36.8
Patient temperature: 36.8
Potassium: 4.1 mmol/L (ref 3.5–5.1)
Potassium: 4.2 mmol/L (ref 3.5–5.1)
Potassium: 3.7 mmol/L (ref 3.5–5.1)
Potassium: 3.8 mmol/L (ref 3.5–5.1)
Potassium: 3.7 mmol/L (ref 3.5–5.1)
Potassium: 3.4 mmol/L — ABNORMAL LOW (ref 3.5–5.1)
Potassium: 3.8 mmol/L (ref 3.5–5.1)
Potassium: 4.1 mmol/L (ref 3.5–5.1)
Potassium: 4.7 mmol/L (ref 3.5–5.1)
Potassium: 4.3 mmol/L (ref 3.5–5.1)
Potassium: 4.6 mmol/L (ref 3.5–5.1)
Sodium: 138 mmol/L (ref 135–145)
Sodium: 138 mmol/L (ref 135–145)
Sodium: 138 mmol/L (ref 135–145)
Sodium: 137 mmol/L (ref 135–145)
Sodium: 139 mmol/L (ref 135–145)
Sodium: 139 mmol/L (ref 135–145)
Sodium: 139 mmol/L (ref 135–145)
Sodium: 139 mmol/L (ref 135–145)
Sodium: 138 mmol/L (ref 135–145)
Sodium: 138 mmol/L (ref 135–145)
Sodium: 140 mmol/L (ref 135–145)
TCO2: 45 mmol/L — ABNORMAL HIGH (ref 22–32)
TCO2: 42 mmol/L — ABNORMAL HIGH (ref 22–32)
TCO2: 37 mmol/L — ABNORMAL HIGH (ref 22–32)
TCO2: 37 mmol/L — ABNORMAL HIGH (ref 22–32)
TCO2: 37 mmol/L — ABNORMAL HIGH (ref 22–32)
TCO2: 36 mmol/L — ABNORMAL HIGH (ref 22–32)
TCO2: 36 mmol/L — ABNORMAL HIGH (ref 22–32)
TCO2: 34 mmol/L — ABNORMAL HIGH (ref 22–32)
TCO2: 32 mmol/L (ref 22–32)
TCO2: 31 mmol/L (ref 22–32)
TCO2: 31 mmol/L (ref 22–32)
pCO2 arterial: 72.3 mmHg (ref 32–48)
pCO2 arterial: 49.5 mmHg — ABNORMAL HIGH (ref 32–48)
pCO2 arterial: 50 mmHg — ABNORMAL HIGH (ref 32–48)
pCO2 arterial: 46.1 mmHg (ref 32–48)
pCO2 arterial: 49.3 mmHg — ABNORMAL HIGH (ref 32–48)
pCO2 arterial: 49.8 mmHg — ABNORMAL HIGH (ref 32–48)
pCO2 arterial: 51.8 mmHg — ABNORMAL HIGH (ref 32–48)
pCO2 arterial: 42.8 mmHg (ref 32–48)
pCO2 arterial: 43.2 mmHg (ref 32–48)
pCO2 arterial: 43.2 mmHg (ref 32–48)
pCO2 arterial: 41.7 mmHg (ref 32–48)
pH, Arterial: 7.379 (ref 7.35–7.45)
pH, Arterial: 7.519 — ABNORMAL HIGH (ref 7.35–7.45)
pH, Arterial: 7.458 — ABNORMAL HIGH (ref 7.35–7.45)
pH, Arterial: 7.496 — ABNORMAL HIGH (ref 7.35–7.45)
pH, Arterial: 7.462 — ABNORMAL HIGH (ref 7.35–7.45)
pH, Arterial: 7.448 (ref 7.35–7.45)
pH, Arterial: 7.428 (ref 7.35–7.45)
pH, Arterial: 7.492 — ABNORMAL HIGH (ref 7.35–7.45)
pH, Arterial: 7.454 — ABNORMAL HIGH (ref 7.35–7.45)
pH, Arterial: 7.438 (ref 7.35–7.45)
pH, Arterial: 7.456 — ABNORMAL HIGH (ref 7.35–7.45)
pO2, Arterial: 425 mmHg — ABNORMAL HIGH (ref 83–108)
pO2, Arterial: 320 mmHg — ABNORMAL HIGH (ref 83–108)
pO2, Arterial: 146 mmHg — ABNORMAL HIGH (ref 83–108)
pO2, Arterial: 142 mmHg — ABNORMAL HIGH (ref 83–108)
pO2, Arterial: 140 mmHg — ABNORMAL HIGH (ref 83–108)
pO2, Arterial: 90 mmHg (ref 83–108)
pO2, Arterial: 61 mmHg — ABNORMAL LOW (ref 83–108)
pO2, Arterial: 113 mmHg — ABNORMAL HIGH (ref 83–108)
pO2, Arterial: 122 mmHg — ABNORMAL HIGH (ref 83–108)
pO2, Arterial: 59 mmHg — ABNORMAL LOW (ref 83–108)
pO2, Arterial: 129 mmHg — ABNORMAL HIGH (ref 83–108)

## 2023-07-15 LAB — RENAL FUNCTION PANEL
Albumin: 2.1 g/dL — ABNORMAL LOW (ref 3.5–5.0)
Albumin: 2.2 g/dL — ABNORMAL LOW (ref 3.5–5.0)
Anion gap: 13 (ref 5–15)
Anion gap: 12 (ref 5–15)
BUN: 56 mg/dL — ABNORMAL HIGH (ref 6–20)
BUN: 58 mg/dL — ABNORMAL HIGH (ref 6–20)
CO2: 29 mmol/L (ref 22–32)
CO2: 27 mmol/L (ref 22–32)
Calcium: 8.5 mg/dL — ABNORMAL LOW (ref 8.9–10.3)
Calcium: 8.1 mg/dL — ABNORMAL LOW (ref 8.9–10.3)
Chloride: 94 mmol/L — ABNORMAL LOW (ref 98–111)
Chloride: 99 mmol/L (ref 98–111)
Creatinine, Ser: 3.63 mg/dL — ABNORMAL HIGH (ref 0.61–1.24)
Creatinine, Ser: 3.48 mg/dL — ABNORMAL HIGH (ref 0.61–1.24)
GFR, Estimated: 21 mL/min — ABNORMAL LOW (ref >60)
GFR, Estimated: 22 mL/min — ABNORMAL LOW (ref >60)
Glucose, Bld: 170 mg/dL — ABNORMAL HIGH (ref 70–99)
Glucose, Bld: 185 mg/dL — ABNORMAL HIGH (ref 70–99)
Phosphorus: 2.2 mg/dL — ABNORMAL LOW (ref 2.5–4.6)
Phosphorus: 3 mg/dL (ref 2.5–4.6)
Potassium: 3.8 mmol/L (ref 3.5–5.1)
Potassium: 4.6 mmol/L (ref 3.5–5.1)
Sodium: 136 mmol/L (ref 135–145)
Sodium: 138 mmol/L (ref 135–145)

## 2023-07-15 LAB — POCT I-STAT, CHEM 8
BUN: 40 mg/dL — ABNORMAL HIGH (ref 6–20)
Calcium, Ion: <0.3 mmol/L — CL (ref 1.15–1.40)
Chloride: 91 mmol/L — ABNORMAL LOW (ref 98–111)
Creatinine, Ser: 2.4 mg/dL — ABNORMAL HIGH (ref 0.61–1.24)
Glucose, Bld: 245 mg/dL — ABNORMAL HIGH (ref 70–99)
HCT: 32 % — ABNORMAL LOW (ref 39.0–52.0)
Hemoglobin: 10.9 g/dL — ABNORMAL LOW (ref 13.0–17.0)
Potassium: 3.7 mmol/L (ref 3.5–5.1)
Sodium: 140 mmol/L (ref 135–145)
TCO2: 32 mmol/L (ref 22–32)

## 2023-07-15 LAB — PROTIME-INR
INR: 1.1 (ref 0.8–1.2)
Prothrombin Time: 14.2 s (ref 11.4–15.2)

## 2023-07-15 LAB — HEPATIC FUNCTION PANEL
ALT: 138 U/L — ABNORMAL HIGH (ref 0–44)
AST: 538 U/L — ABNORMAL HIGH (ref 15–41)
Albumin: 2.1 g/dL — ABNORMAL LOW (ref 3.5–5.0)
Alkaline Phosphatase: 73 U/L (ref 38–126)
Bilirubin, Direct: 1.7 mg/dL — ABNORMAL HIGH (ref 0.0–0.2)
Indirect Bilirubin: 2.5 mg/dL — ABNORMAL HIGH (ref 0.3–0.9)
Total Bilirubin: 4.2 mg/dL — ABNORMAL HIGH (ref 0.0–1.2)
Total Protein: 5.1 g/dL — ABNORMAL LOW (ref 6.5–8.1)

## 2023-07-15 LAB — CULTURE, RESPIRATORY W GRAM STAIN

## 2023-07-15 LAB — CBC
HCT: 25.1 % — ABNORMAL LOW (ref 39.0–52.0)
HCT: 26.7 % — ABNORMAL LOW (ref 39.0–52.0)
Hemoglobin: 8.6 g/dL — ABNORMAL LOW (ref 13.0–17.0)
Hemoglobin: 8.9 g/dL — ABNORMAL LOW (ref 13.0–17.0)
MCH: 27.2 pg (ref 26.0–34.0)
MCH: 27.3 pg (ref 26.0–34.0)
MCHC: 34.3 g/dL (ref 30.0–36.0)
MCHC: 33.3 g/dL (ref 30.0–36.0)
MCV: 79.4 fL — ABNORMAL LOW (ref 80.0–100.0)
MCV: 81.9 fL (ref 80.0–100.0)
Platelets: 84 10*3/uL — ABNORMAL LOW (ref 150–400)
Platelets: 84 10*3/uL — ABNORMAL LOW (ref 150–400)
RBC: 3.16 MIL/uL — ABNORMAL LOW (ref 4.22–5.81)
RBC: 3.26 MIL/uL — ABNORMAL LOW (ref 4.22–5.81)
RDW: 19.9 % — ABNORMAL HIGH (ref 11.5–15.5)
RDW: 21.2 % — ABNORMAL HIGH (ref 11.5–15.5)
WBC: 23.8 10*3/uL — ABNORMAL HIGH (ref 4.0–10.5)
WBC: 22.4 10*3/uL — ABNORMAL HIGH (ref 4.0–10.5)
nRBC: 1 % — ABNORMAL HIGH (ref 0.0–0.2)
nRBC: 0.8 % — ABNORMAL HIGH (ref 0.0–0.2)

## 2023-07-15 LAB — BASIC METABOLIC PANEL
Anion gap: 9 (ref 5–15)
BUN: 59 mg/dL — ABNORMAL HIGH (ref 6–20)
CO2: 27 mmol/L (ref 22–32)
Calcium: 7.9 mg/dL — ABNORMAL LOW (ref 8.9–10.3)
Chloride: 101 mmol/L (ref 98–111)
Creatinine, Ser: 3.61 mg/dL — ABNORMAL HIGH (ref 0.61–1.24)
GFR, Estimated: 21 mL/min — ABNORMAL LOW (ref >60)
Glucose, Bld: 214 mg/dL — ABNORMAL HIGH (ref 70–99)
Potassium: 4.5 mmol/L (ref 3.5–5.1)
Sodium: 137 mmol/L (ref 135–145)

## 2023-07-15 LAB — GLUCOSE, CAPILLARY
Glucose-Capillary: 175 mg/dL — ABNORMAL HIGH (ref 70–99)
Glucose-Capillary: 177 mg/dL — ABNORMAL HIGH (ref 70–99)
Glucose-Capillary: 125 mg/dL — ABNORMAL HIGH (ref 70–99)
Glucose-Capillary: 166 mg/dL — ABNORMAL HIGH (ref 70–99)
Glucose-Capillary: 149 mg/dL — ABNORMAL HIGH (ref 70–99)

## 2023-07-15 LAB — APTT
aPTT: 32 s (ref 24–36)
aPTT: 59 s — ABNORMAL HIGH (ref 24–36)

## 2023-07-15 LAB — MAGNESIUM: Magnesium: 2 mg/dL (ref 1.7–2.4)

## 2023-07-15 LAB — LACTATE DEHYDROGENASE: LDH: >2500 U/L — ABNORMAL HIGH (ref 98–192)

## 2023-07-15 LAB — CG4 I-STAT (LACTIC ACID): Lactic Acid, Venous: 0.9 mmol/L (ref 0.5–1.9)

## 2023-07-15 LAB — FIBRINOGEN: Fibrinogen: 396 mg/dL (ref 210–475)

## 2023-07-15 MED ORDER — AMIODARONE HCL IN DEXTROSE 360-4.14 MG/200ML-% IV SOLN
INTRAVENOUS | Status: AC
  Filled 2023-07-15: qty 200

## 2023-07-15 MED ORDER — LIDOCAINE HCL (CARDIAC) PF 100 MG/5ML IV SOSY
100.0000 mg | PREFILLED_SYRINGE | Freq: Once | INTRAVENOUS | Status: AC
  Administered 2023-07-15: 100 mg via INTRAVENOUS

## 2023-07-15 MED ORDER — HYDROMORPHONE BOLUS VIA INFUSION
0.2500 mg | INTRAVENOUS | Status: DC | PRN
  Administered 2023-07-16 (×3): 1 mg via INTRAVENOUS
  Administered 2023-07-16: 0.75 mg via INTRAVENOUS
  Administered 2023-07-16: 2 mg via INTRAVENOUS
  Administered 2023-07-16: 0.75 mg via INTRAVENOUS
  Administered 2023-07-16 (×3): 1 mg via INTRAVENOUS
  Administered 2023-07-16 (×2): 0.5 mg via INTRAVENOUS
  Administered 2023-07-16 (×2): 1 mg via INTRAVENOUS
  Administered 2023-07-17 – 2023-07-21 (×15): 2 mg via INTRAVENOUS
  Administered 2023-07-22: 0.5 mg via INTRAVENOUS
  Administered 2023-07-22: 2 mg via INTRAVENOUS

## 2023-07-15 MED ORDER — AMIODARONE LOAD VIA INFUSION
150.0000 mg | Freq: Once | INTRAVENOUS | Status: AC
  Administered 2023-07-15: 150 mg via INTRAVENOUS
  Filled 2023-07-15: qty 83.34

## 2023-07-15 MED ORDER — EPINEPHRINE 1 MG/10ML IJ SOSY
PREFILLED_SYRINGE | INTRAMUSCULAR | Status: AC
  Filled 2023-07-15: qty 10

## 2023-07-15 MED ORDER — BISACODYL 5 MG PO TBEC: 5.0000 mg | DELAYED_RELEASE_TABLET | Freq: Once | ORAL | Status: DC

## 2023-07-15 MED ORDER — TEMAZEPAM 15 MG PO CAPS: 15.0000 mg | ORAL_CAPSULE | Freq: Once | ORAL | Status: DC | PRN

## 2023-07-15 MED ORDER — CHLORHEXIDINE GLUCONATE 0.12 % MT SOLN
15.0000 mL | Freq: Once | OROMUCOSAL | Status: AC
  Administered 2023-07-16: 15 mL via OROMUCOSAL
  Filled 2023-07-15: qty 15

## 2023-07-15 MED ORDER — HYDROCORTISONE SOD SUC (PF) 100 MG IJ SOLR
50.0000 mg | Freq: Two times a day (BID) | INTRAMUSCULAR | Status: DC
  Administered 2023-07-15 – 2023-07-16 (×4): 50 mg via INTRAVENOUS
  Filled 2023-07-15 (×4): qty 2

## 2023-07-15 MED ORDER — EPINEPHRINE HCL 5 MG/250ML IV SOLN IN NS
INTRAVENOUS | Status: AC
  Filled 2023-07-15: qty 250

## 2023-07-15 MED ORDER — PRISMASOL BGK 4/2.5 32-4-2.5 MEQ/L EC SOLN: Status: DC

## 2023-07-15 MED ORDER — AMIODARONE IV BOLUS ONLY 150 MG/100ML: 150.0000 mg | Freq: Once | INTRAVENOUS | Status: DC

## 2023-07-15 MED ORDER — VASOPRESSIN 20 UNITS/100 ML INFUSION FOR SHOCK
0.0000 [IU]/min | INTRAVENOUS | Status: DC
Start: 2023-07-15 — End: 2023-07-27
  Administered 2023-07-15 – 2023-07-16 (×4): 0.04 [IU]/min via INTRAVENOUS
  Administered 2023-07-17 – 2023-07-18 (×3): 0.03 [IU]/min via INTRAVENOUS
  Administered 2023-07-19: 0.02 [IU]/min via INTRAVENOUS
  Administered 2023-07-20 (×3): 0.04 [IU]/min via INTRAVENOUS
  Administered 2023-07-21 – 2023-07-24 (×3): 0.03 [IU]/min via INTRAVENOUS
  Filled 2023-07-15 (×15): qty 100

## 2023-07-15 MED ORDER — AMIODARONE HCL IN DEXTROSE 360-4.14 MG/200ML-% IV SOLN
30.0000 mg/h | INTRAVENOUS | Status: DC
  Administered 2023-07-15 – 2023-07-16 (×2): 30 mg/h via INTRAVENOUS
  Filled 2023-07-15 (×2): qty 200

## 2023-07-15 MED ORDER — HYDROMORPHONE HCL 1 MG/ML IJ SOLN
1.0000 mg | Freq: Once | INTRAMUSCULAR | Status: AC
  Administered 2023-07-15: 1 mg via INTRAVENOUS
  Filled 2023-07-15: qty 1

## 2023-07-15 MED ORDER — CHLORHEXIDINE GLUCONATE 4 % EX SOLN
1.0000 | Freq: Once | CUTANEOUS | Status: AC
  Administered 2023-07-16: 1 via TOPICAL
  Filled 2023-07-15: qty 15

## 2023-07-15 MED ORDER — CHLORHEXIDINE GLUCONATE 0.12 % MT SOLN: 15.0000 mL | Freq: Once | OROMUCOSAL | Status: AC

## 2023-07-15 MED ORDER — AMIODARONE HCL IN DEXTROSE 360-4.14 MG/200ML-% IV SOLN
60.0000 mg/h | INTRAVENOUS | Status: AC
  Administered 2023-07-15: 60 mg/h via INTRAVENOUS
  Filled 2023-07-15: qty 200

## 2023-07-15 MED ORDER — CEFAZOLIN SODIUM-DEXTROSE 2-4 GM/100ML-% IV SOLN: 2.0000 g | INTRAVENOUS | Status: DC

## 2023-07-15 MED ORDER — HYDROMORPHONE HCL-NACL 50-0.9 MG/50ML-% IV SOLN
0.5000 mg/h | INTRAVENOUS | Status: DC
  Administered 2023-07-15: 1 mg/h via INTRAVENOUS
  Administered 2023-07-16: 2 mg/h via INTRAVENOUS
  Administered 2023-07-16: 2.5 mg/h via INTRAVENOUS
  Administered 2023-07-17 – 2023-07-18 (×3): 4 mg/h via INTRAVENOUS
  Administered 2023-07-18: 8 mg/h via INTRAVENOUS
  Administered 2023-07-18: 4 mg/h via INTRAVENOUS
  Administered 2023-07-19 – 2023-07-21 (×13): 8 mg/h via INTRAVENOUS
  Administered 2023-07-22: 4 mg/h via INTRAVENOUS
  Administered 2023-07-22 (×2): 8 mg/h via INTRAVENOUS
  Filled 2023-07-15 (×25): qty 50

## 2023-07-15 MED ORDER — VASOPRESSIN 20 UNITS/100 ML INFUSION FOR SHOCK
INTRAVENOUS | Status: AC
  Filled 2023-07-15: qty 100

## 2023-07-15 MED ORDER — CEFAZOLIN SODIUM-DEXTROSE 2-4 GM/100ML-% IV SOLN
2.0000 g | Freq: Once | INTRAVENOUS | Status: AC
  Administered 2023-07-16: 2 g via INTRAVENOUS
  Filled 2023-07-15: qty 100

## 2023-07-15 NOTE — Progress Notes (Signed)
 NAME:  Aaron [name], MRN:  [id number], DOB:  Aug 14, 1985, LOS: 6 ADMISSION DATE:  07/09/2023, CONSULTATION DATE:  07/11/2023 REFERRING MD: Clearnce Hasten, CHIEF COMPLAINT: Status post cardiac arrest  History of Present Illness:  38 year old male with hypothyroidism, paroxysmal A-fib, severe mitral regurgitation who presented to [facility] hospital with chest pain and shortness of breath for few days associated cough, nausea and diarrhea.  In the emergency department he was diagnosed with PE, was started on IV heparin.  He takes beta-blocker, steroid and methimazole for hyperthyroidism.  On 2/27 patient rapidly deteriorated developed V. tach/V-fib cardiac arrest patient was intubated after 40 minutes of CPR, cardiology was consulted patient was placed on VA ECMO and was transferred to [facility]  Pertinent  Medical History   Past Medical History:  Diagnosis Date   Atrial fibrillation [facility])    Hyperthyroidism    Mitral regurgitation    NSTEMI (non-ST elevated myocardial infarction) (HCC)      Significant Hospital Events: Including procedures, antibiotic start and stop dates in addition to other pertinent events     Interim History / Subjective:  Underwent Impella 5.5 placement yesterday, CP Impella was discontinued  Started with mixing cloud due to hypertension ECMO flow was increased to 4.2 L He is on Impella at P3 with 1.7 L flow Required another PRBC, hemoglobin 8.1 now On Cardene infusion at 5 mg   Objective   Blood pressure (!) 86/73, pulse 69, temperature 98.2 F (36.8 C), resp. rate 10, height 6\' 4"  (1.93 m), weight 85.9 kg, SpO2 99%. PAP: (22-62)/(-3-36) 32/15 CVP:  [1 mmHg-15 mmHg] 5 mmHg  Vent Mode: PRVC FiO2 (%):  [40 %-70 %] 50 % Set Rate:  [10 bmp] 10 bmp Vt Set:  [560 mL-580 mL] 580 mL PEEP:  [5 cmH20] 5 cmH20 Plateau Pressure:  [16 cmH20-25 cmH20] 22 cmH20   Intake/Output Summary (Last 24 hours) at 07/15/2023 0841 Last data filed at 07/15/2023  0800 Gross per 24 hour  Intake 5549.24 ml  Output 6084 ml  Net -534.76 ml   Filed Weights   07/13/23 0600 07/14/23 0500 07/15/23 0500  Weight: 90.9 kg 86.6 kg 85.9 kg    Examination: General: Crtitically ill-appearing young male, orally intubated HEENT: /AT, eyes anicteric.  ETT and OGT in place Neuro: Sedated, not following commands.  Eyes are closed.  Pupils 3 mm bilateral reactive to light Chest: Impella 5 5 in place on left side of chest, coarse breath sounds, no wheezes or rhonchi Heart: Regular rate and rhythm, no murmurs or gallops Abdomen: Soft, nondistended, bowel sounds present Skin: Multiple tattoo marks noted on chest and bilateral upper extremities Extremities: ECMO cannula noted in groin   Labs and images reviewed  Resolved Hospital Problem list   Lactic acidosis, resolved Refractory hyperkalemia, improving  Assessment & Plan:  Status post in-hospital V. tach followed by PEA cardiac arrest Paroxysmal A-fib RVR Severe mitral regurgitation Acute biventricular HFrEF with cardiogenic shock on VA ECMO Acute pulmonary emboli status post TNK Acute respiratory failure with hypoxia Hyperthyroidism Acute kidney injury due to ischemic ATN on CRRT Acute respiratory failure with hypoxia Bilateral multifocal pneumonia Hyperphosphatemia/hypocalcemia Shock liver Acute on chronic anemia due to critical illness and hemolysis Thrombocytopenia due to critical illness  On VA ECMO at 4.2 L flow, tolerating well, no issues CP Impella was switched to 5.5, currently at P3 with 1.7 L flow Will try to give him ECMO weaning trial today Remain in sinus rhythm Closely monitor and supplement electrolytes Advanced  heart failure team is following, patient will eventually need mitral valve surgery once clinically stable On CRRT with plan to keep net -150-200 cc/h CVP is at 3, filling pressures are better today Nephrology is following Monitor PTT, continue bival Will stop citrate  via CRRT Continue lung protective ventilation, FiO2 was increased to 50% yesterday Now PaO2 is improved, will decrease FiO2 to 40% VAP prevention bundle in place Will switch fentanyl infusion to Dilaudid Continue Precedex with RASS goal -2 Continue as needed Versed Continue methimazole 10 mg 3 times daily Decrease stress dose steroid to 50 mg 3 times daily Monitor H&H and transfuse if less than 8 Patient respiratory culture is growing staph aureus and staph epi, sensitivities are pending Continue antibiotics with vancomycin and meropenem LDH remain elevated with elevated indirect bilirubin which is suggestive of hemolysis Monitor platelet count, currently in 80s  Best Practice (right click and "Reselect all SmartList Selections" daily)   Diet/type: NPO TF DVT prophylaxis systemic bival Pressure ulcer(s): N/A GI prophylaxis: PPI Lines: Central line, Arterial Line, and yes and it is still needed.  ECMO cannula in place Foley:  Yes, and it is still needed Code Status:  full code Last date of multidisciplinary goals of care discussion [Per primary team]  Labs   CBC: Recent Labs  Lab 07/13/23 1607 07/13/23 1608 07/14/23 0327 07/14/23 0732 07/14/23 1603 07/14/23 1609 07/14/23 2100 07/14/23 2355 07/15/23 1610 07/15/23 0415 07/15/23 0423 07/15/23 0428 07/15/23 0605  WBC 32.1*  --  32.2*  --  27.4*  --  24.0*  --   --  23.8*  --   --   --   HGB 7.4*   < > 7.2*   < > 7.9*   < > 8.4*   < > 8.5* 8.6* 9.2* 10.9* 8.5*  HCT 20.6*   < > 20.1*   < > 22.0*   < > 23.8*   < > 25.0* 25.1* 27.0* 32.0* 25.0*  MCV 76.9*  --  77.6*  --  79.1*  --  79.3*  --   --  79.4*  --   --   --   PLT 86*  --  87*  --  78*  --  82*  --   --  84*  --   --   --    < > = values in this interval not displayed.    Basic Metabolic Panel: Recent Labs  Lab 07/12/23 0427 07/12/23 0440 07/12/23 1600 07/12/23 1604 07/13/23 0403 07/13/23 0409 07/13/23 1607 07/13/23 1608 07/14/23 0327 07/14/23 0732  07/14/23 9604 07/14/23 1406 07/14/23 1603 07/14/23 1609 07/14/23 2100 07/14/23 2355 07/15/23 0156 07/15/23 0415 07/15/23 0423 07/15/23 0428 07/15/23 0605  NA 138   < > 137   < > 138   < > 141   < > 137   < > 140   < > 142   < > 138   < > 138 136 137 140 139  K 4.6   < > 4.4   < > 3.9   < > 4.0   < > 4.0   < > 3.8   < > 3.8   < > 3.1*   < > 3.7 3.8 3.8 3.7 3.7  CL 99   < > 97*   < > 93*   < > 94*   < > 92*  --  88*  --  95*  --  94*  --   --  94*  --  91*  --   CO2 25  --  28  --  32  --  34*  --  34*  --   --   --  35*  --  33*  --   --  29  --   --   --   GLUCOSE 197*   < > 204*   < > 223*   < > 168*   < > 219*  --  244*  --  165*  --  181*  --   --  170*  --  245*  --   BUN 38*   < > 35*   < > 36*   < > 37*   < > 46*  --  43*  --  55*  --  57*  --   --  56*  --  40*  --   CREATININE 3.03*   < > 3.01*   < > 3.09*   < > 3.20*   < > 3.26*  --  2.60*  --  3.65*  --  3.62*  --   --  3.63*  --  2.40*  --   CALCIUM 9.2  --  8.9  --  9.5  --  9.3  --  8.9  --   --   --  8.5*  --  8.5*  --   --  8.5*  --   --   --   MG 1.7  --  2.2  --  2.4  --   --   --  2.2  --   --   --   --   --   --   --   --  2.0  --   --   --   PHOS 5.6*  --  4.5  --  2.9  --   --   --  2.5  --   --   --  3.2  --   --   --   --  2.2*  --   --   --    < > = values in this interval not displayed.   GFR: Estimated Creatinine Clearance: 50.7 mL/min (A) (by C-G formula based on SCr of 2.4 mg/dL (H)). Recent Labs  Lab 07/12/23 1159 07/12/23 1600 07/13/23 0413 07/13/23 1607 07/14/23 0327 07/14/23 0823 07/14/23 1603 07/14/23 2100 07/15/23 0415 07/15/23 0424  WBC  --    < >  --    < > 32.2*  --  27.4* 24.0* 23.8*  --   LATICACIDVEN 2.0*  --  1.3  --   --  0.9  --   --   --  0.9   < > = values in this interval not displayed.    Liver Function Tests: Recent Labs  Lab 07/12/23 0427 07/12/23 1600 07/13/23 0403 07/14/23 0327 07/14/23 1603 07/15/23 0415  AST 1,324* 1,108* 970* 767*  --  538*  ALT 472* 382* 355*  224*  --  138*  ALKPHOS 71 78 74 68  --  73  BILITOT 8.6* 7.4* 7.3* 7.9*  --  4.2*  PROT 4.6* 4.4* 4.7* NOT CALCULATED  --  5.1*  ALBUMIN 2.1* 1.9*  1.9* 2.0* 2.2* 1.9* 2.1*  2.1*   Recent Labs  Lab 07/09/23 1934  LIPASE 23   No results for input(s): "AMMONIA" in the last 168 hours.  ABG    Component Value Date/Time   PHART 7.462 (H) 07/15/2023 0605   PCO2ART 49.3 (  H) 07/15/2023 0605   PO2ART 140 (H) 07/15/2023 0605   HCO3 35.5 (H) 07/15/2023 0605   TCO2 37 (H) 07/15/2023 0605   ACIDBASEDEF 1.0 07/11/2023 1314   O2SAT 99 07/15/2023 0605     Coagulation Profile: Recent Labs  Lab 07/11/23 0404 07/12/23 0427 07/13/23 0403 07/14/23 0327 07/15/23 0415  INR 4.8* 2.6* 1.5* 1.4* 1.1    Cardiac Enzymes: Recent Labs  Lab 07/11/23 0512 07/11/23 1854  CKTOTAL 117 357    HbA1C: Hgb A1c MFr Bld  Date/Time Value Ref Range Status  07/10/2023 06:44 AM 4.8 4.8 - 5.6 % Final    Comment:    (NOTE) Pre diabetes:          5.7%-6.4%  Diabetes:              >6.4%  Glycemic control for   <7.0% adults with diabetes   10/20/2021 01:07 AM 4.2 (L) 4.8 - 5.6 % Final    Comment:    (NOTE) Pre diabetes:          5.7%-6.4%  Diabetes:              >6.4%  Glycemic control for   <7.0% adults with diabetes     CBG: Recent Labs  Lab 07/14/23 1607 07/14/23 2000 07/14/23 2352 07/15/23 0419 07/15/23 0736  GLUCAP 177* 164* 177* 175* 177*    Critical care time:     The patient is critically ill due to cardiogenic shock status post VA ECMO.  Critical care was necessary to treat or prevent imminent or life-threatening deterioration.  Critical care was time spent personally by me on the following activities: development of treatment plan with patient and/or surrogate as well as nursing, discussions with consultants, evaluation of patient's response to treatment, examination of patient, obtaining history from patient or surrogate, ordering and performing treatments and  interventions, ordering and review of laboratory studies, ordering and review of radiographic studies, pulse oximetry, re-evaluation of patient's condition and participation in multidisciplinary rounds.   During this encounter critical care time was devoted to patient care services described in this note for 44 minutes.     Cheri Fowler, MD [facility] See Amion for pager If no response to pager, please call [phone] until 7pm After 7pm, Please call E-link [phone]

## 2023-07-15 NOTE — Progress Notes (Signed)
 Advanced Heart Failure Rounding Note  Cardiologist: Christell Constant, MD  Chief Complaint: V-A ECMO  Subjective:    Admitted 2/26 with worsening shortness of breath, chest pain, atrial fibrillation with RVR. Decompensated 2/27 with IVF, nodal blockade with subsequent respiratory arrest, ROSC achieved after ~16 minute down time Femoral VA ecmo cannulation 2/27 with Impella CP vent  - CVP 2, PA 35-40/13-18 on V-A ECMO flows at 4LPM, Impella at P2 flowing 1.6LPM.    Objective:   Weight Range: 85.9 kg Body mass index is 23.05 kg/m.   Vital Signs:   Temp:  [96.8 F (36 C)-98.6 F (37 C)] 98.2 F (36.8 C) (03/04 1000) Pulse Rate:  [65-80] 75 (03/04 1000) Resp:  [9-16] 10 (03/04 1000) SpO2:  [90 %-100 %] 100 % (03/04 1000) Arterial Line BP: (84-144)/(58-99) 92/58 (03/04 1000) FiO2 (%):  [40 %-70 %] 50 % (03/04 0745) Weight:  [85.9 kg] 85.9 kg (03/04 0500) Last BM Date : 07/14/23  Weight change: Filed Weights   07/13/23 0600 07/14/23 0500 07/15/23 0500  Weight: 90.9 kg 86.6 kg 85.9 kg    Intake/Output:   Intake/Output Summary (Last 24 hours) at 07/15/2023 1007 Last data filed at 07/15/2023 1000 Gross per 24 hour  Intake 5715.19 ml  Output 6650.2 ml  Net -935.01 ml      Physical Exam    General: sedated/intubated Lungs: mechanical lung sounds CV: RRR; lines well sutured with no hematoma, erythema or bleeding. Femoral CP & ECMO cannulas sutured into place; no hematoma/bleeding; impella 5.5 axillary site without bleeding Abdomen: soft Neurologic: sedated   Telemetry   Sinus rhythm   Labs    CBC Recent Labs    07/14/23 2100 07/14/23 2355 07/15/23 0415 07/15/23 0423 07/15/23 0428 07/15/23 0605  WBC 24.0*  --  23.8*  --   --   --   HGB 8.4*   < > 8.6*   < > 10.9* 8.5*  HCT 23.8*   < > 25.1*   < > 32.0* 25.0*  MCV 79.3*  --  79.4*  --   --   --   PLT 82*  --  84*  --   --   --    < > = values in this interval not displayed.   Basic Metabolic  Panel Recent Labs    07/14/23 0327 07/14/23 0732 07/14/23 1603 07/14/23 1609 07/14/23 2100 07/14/23 2355 07/15/23 0415 07/15/23 0423 07/15/23 0428 07/15/23 0605  NA 137   < > 142   < > 138   < > 136   < > 140 139  K 4.0   < > 3.8   < > 3.1*   < > 3.8   < > 3.7 3.7  CL 92*   < > 95*  --  94*  --  94*  --  91*  --   CO2 34*  --  35*  --  33*  --  29  --   --   --   GLUCOSE 219*   < > 165*  --  181*  --  170*  --  245*  --   BUN 46*   < > 55*  --  57*  --  56*  --  40*  --   CREATININE 3.26*   < > 3.65*  --  3.62*  --  3.63*  --  2.40*  --   CALCIUM 8.9  --  8.5*  --  8.5*  --  8.5*  --   --   --  MG 2.2  --   --   --   --   --  2.0  --   --   --   PHOS 2.5  --  3.2  --   --   --  2.2*  --   --   --    < > = values in this interval not displayed.   Liver Function Tests Recent Labs    07/14/23 0327 07/14/23 1603 07/15/23 0415  AST 767*  --  538*  ALT 224*  --  138*  ALKPHOS 68  --  73  BILITOT 7.9*  --  4.2*  PROT NOT CALCULATED  --  5.1*  ALBUMIN 2.2* 1.9* 2.1*  2.1*   No results for input(s): "LIPASE", "AMYLASE" in the last 72 hours.  Cardiac Enzymes No results for input(s): "CKTOTAL", "CKMB", "CKMBINDEX", "TROPONINI" in the last 72 hours.   BNP: BNP (last 3 results) Recent Labs    07/09/23 1934 07/10/23 1224  BNP 703.7* 980.5*   Thyroid Function Tests Recent Labs    07/14/23 0327  TSH <0.010*     Medications:     Scheduled Medications:  sodium chloride   Intravenous Once   Chlorhexidine Gluconate Cloth  6 each Topical Daily   clonazePAM  1 mg Per Tube BID   docusate  100 mg Per Tube BID   feeding supplement (PROSource TF20)  60 mL Per Tube TID   fentaNYL (SUBLIMAZE) injection  50 mcg Intravenous Once   fiber supplement (BANATROL TF)  60 mL Per Tube BID   folic acid  1 mg Per Tube Daily   hydrocortisone sod succinate (SOLU-CORTEF) inj  50 mg Intravenous Q12H   insulin aspart  0-20 Units Subcutaneous Q4H   insulin aspart  5 Units Subcutaneous  Q4H   methimazole  10 mg Per Tube Q8H   metoCLOPramide (REGLAN) injection  10 mg Intravenous Q8H   multivitamin  1 tablet Per Tube QHS   mouth rinse  15 mL Mouth Rinse Q2H   oxyCODONE  5 mg Per Tube Q6H   pantoprazole (PROTONIX) IV  40 mg Intravenous QHS   polyethylene glycol  17 g Per Tube Daily   potassium chloride  40 mEq Per Tube BID   QUEtiapine  50 mg Per Tube BID   sodium bicarbonate  50 mEq Intravenous Once   sodium chloride flush  3 mL Intravenous Q12H   thiamine  100 mg Per Tube Daily   Or   thiamine  100 mg Intravenous Daily    Infusions:  albumin human Stopped (07/14/23 0301)   bivalirudin (ANGIOMAX) 250 mg in sodium chloride 0.9 % 500 mL (0.5 mg/mL) infusion 0.03 mg/kg/hr (07/15/23 1000)   dexmedetomidine (PRECEDEX) IV infusion 1.2 mcg/kg/hr (07/15/23 1000)   feeding supplement (PIVOT 1.5 CAL) 45 mL/hr at 07/15/23 1000   HYDROmorphone 1 mg/hr (07/15/23 1000)   meropenem (MERREM) IV Stopped (07/15/23 0554)   niCARDipine Stopped (07/15/23 0752)   prismasol BGK 4/2.5 1,500 mL/hr at 07/15/23 0924   prismasol BGK 4/2.5 400 mL/hr at 07/15/23 0316   prismasol BGK 4/2.5 340 mL/hr at 07/15/23 0843   sodium bicarbonate 25 mEq (Impella PURGE) in dextrose 5 % 1000 mL bag 5.8 mL/hr at 07/13/23 0248   vancomycin 200 mL/hr at 07/15/23 1000    PRN Medications: acetaminophen **OR** acetaminophen, albumin human, heparin, HYDROmorphone, midazolam, mouth rinse    Patient Profile   Patient with a history of Grave's disease, severe primary mitral regurgitation who presented with  chest pain and segmental PE. Subsequent progressed to SCAI Stage E shock requiring VA ECMO cannulation on 2/27.   Assessment/Plan   SCAI Stage E Cardiogenic shock - Suspect hypoxic respiratory failure driven with segmental PE on top of severe MR, nodal blockage, and thyrotoxicosis - Down time ~ 20 minutes, good mental status confirmed post code - Lactic acid resolved; repeat this AM pending.  -  Vanc/meropenem for antibiotic prophylaxis, suspect leukocytosis is reactive and in the setting of steroids; WBC ct now plateau at 32K.  - Impella 5.5 placed yesterday; bleeding from axillary pocket has now resolved. Restarting bivalirudin - We performed a mini-turn down today at bedside; ECMO flows were reduced in a stepwise pattern by 0.5LPM to a goal of 2.5LPM. During this time, impella 5.5 flows increased to maintain net even flows. With reduction of ECMO flows, patient had onset of frequent PVCs/NSVT requiring amio boluses. In addition, despite increased LV unloading patient continued to have severe 4+ MR with progressive RV failure on bedside TTE.  - Will discuss bail out TEER with our structural heart team.   Severe mitral valve regurgitation - Noted on previous echocardiogram - Suspect contributing to ongoing shock - ECPella for management currently - TCTS consult for consideration of mitral valve repair - Despite weaning down V-A ECMO flow with reduction in MAP to 55-60 & unloading with impella 5.5 @ P8, patient continued to have severe 4+ MR by bedside TTE.   Anemia - Multiple blood transfusions yesterday - Suspect component of hemolysis given bilirubin, LDH, slowly trending downwards - CK and lactic acid reassuring - stable  Acute renal failure/hyperkalemia - Appreciate nephrology consult - CRRT set up through ECMO circuit  Thyrotoxicosis - Was not taking medications as they made him feel poorly - Suspect underlying low output heart failure due to mitral valve disease - Continue IV hydrocortisone 100mg  q8 - Continue methimazole 15mg  q8 - TSH below detectable limit; T4 0.54 07/14/23,  - Decreased steroid dosing to 50mg ; decreased methimazole to 10mg  TID. Repeat T4 tomorrow AM.  - Will need definitive outpatient treatment  Atrial fibrillation - Present on arrival, in the setting of PE and thyroid disease - restarting amio gtt due to frequent ectopy today.  - Bivalrudin for  anticoagulation  Pulmonary embolism:  - Segmental without right heart strain - Bivalrudin as above  CAD - Prior embolic infarct in the LAD territory with corresponding LGE on CMR - Lifelong anticoagulation  Sedation - Fentanyl, precedex - Versed as needed - Spot EEG without evidence of seizure - Notably minimal alcohol use after discussion with fiance.   Medication concerns reviewed with patient and pharmacy team. Barriers identified: antibiotic dosing, CRRT  Length of Stay: 6  Tanishia Lemaster, DO  07/15/2023, 10:07 AM  Advanced Heart Failure Team Pager [phone] (M-F; 7a - 5p)  Please contact [facility] for night-coverage after hours (5p -7a ) and weekends on [email or web address]  CRITICAL CARE Performed by: Dorthula Nettles   Total critical care time: 75 minutes  Critical care time was exclusive of separately billable procedures and treating other patients.  Critical care was necessary to treat or prevent imminent or life-threatening deterioration.  Critical care was time spent personally by me on the following activities: development of treatment plan with patient and/or surrogate as well as nursing, discussions with consultants, evaluation of patient's response to treatment, examination of patient, obtaining history from patient or surrogate, ordering and performing treatments and interventions, ordering and review of laboratory studies, ordering and review of radiographic  studies, pulse oximetry and re-evaluation of patient's condition.

## 2023-07-15 NOTE — Progress Notes (Signed)
 1 Day Post-Op Procedure(s) (LRB): PLACEMENT OF RIGHT AXILLARY IMPELLA 5.5 LEFT VENTRICULAR ASSIST DEVICE (Right) REMOVAL OF CP RIGHT FEMORAL IMPELLA LEFT VENTRICULAR ASSIST DEVICE (Right) Subjective: Intubated, sedated  Objective: Vital signs in last 24 hours: Temp:  [96.8 F (36 C)-98.6 F (37 C)] 98.4 F (36.9 C) (03/04 0700) Pulse Rate:  [65-82] 69 (03/04 0700) Cardiac Rhythm: Normal sinus rhythm (03/04 0400) Resp:  [9-27] 10 (03/04 0700) SpO2:  [90 %-100 %] 100 % (03/04 0700) Arterial Line BP: (84-149)/(58-99) 102/66 (03/04 0700) FiO2 (%):  [40 %-70 %] 50 % (03/04 0605) Weight:  [85.9 kg] 85.9 kg (03/04 0500)  Hemodynamic parameters for last 24 hours: PAP: (22-62)/(-3-36) 32/15 CVP:  [1 mmHg-15 mmHg] 5 mmHg  Intake/Output from previous day: 03/03 0701 - 03/04 0700 In: 5528 [I.V.:2866.7; Blood:910; NG/GT:964.3; IV Piggyback:599.8] Out: 5785 [Urine:18; Blood:350] Intake/Output this shift: No intake/output data recorded.  General appearance: sedated, intubated Lungs: clear to auscultation bilaterally Abdomen: nondistended Wound: no further oozing from Impella site  Lab Results: Recent Labs    07/14/23 2100 07/14/23 2355 07/15/23 0415 07/15/23 0423 07/15/23 0428 07/15/23 0605  WBC 24.0*  --  23.8*  --   --   --   HGB 8.4*   < > 8.6*   < > 10.9* 8.5*  HCT 23.8*   < > 25.1*   < > 32.0* 25.0*  PLT 82*  --  84*  --   --   --    < > = values in this interval not displayed.   BMET:  Recent Labs    07/14/23 2100 07/14/23 2355 07/15/23 0415 07/15/23 0423 07/15/23 0428 07/15/23 0605  NA 138   < > 136   < > 140 139  K 3.1*   < > 3.8   < > 3.7 3.7  CL 94*  --  94*  --  91*  --   CO2 33*  --  29  --   --   --   GLUCOSE 181*  --  170*  --  245*  --   BUN 57*  --  56*  --  40*  --   CREATININE 3.62*  --  3.63*  --  2.40*  --   CALCIUM 8.5*  --  8.5*  --   --   --    < > = values in this interval not displayed.    PT/INR:  Recent Labs    07/15/23 0415   LABPROT 14.2  INR 1.1   ABG    Component Value Date/Time   PHART 7.462 (H) 07/15/2023 0605   HCO3 35.5 (H) 07/15/2023 0605   TCO2 37 (H) 07/15/2023 0605   ACIDBASEDEF 1.0 07/11/2023 1314   O2SAT 99 07/15/2023 0605   CBG (last 3)  Recent Labs    07/14/23 2352 07/15/23 0419 07/15/23 0736  GLUCAP 177* 175* 177*    Assessment/Plan: S/P Procedure(s) (LRB): PLACEMENT OF RIGHT AXILLARY IMPELLA 5.5 LEFT VENTRICULAR ASSIST DEVICE (Right) REMOVAL OF CP RIGHT FEMORAL IMPELLA LEFT VENTRICULAR ASSIST DEVICE (Right) POD # 1 Impella 5.5   Device functioning Still on full support with VA ECMO Ok to resume Bivalrudin   LOS: 6 days    Loreli Slot 07/15/2023

## 2023-07-15 NOTE — Consult Note (Addendum)
 [facility]   MULTIDISCIPLINARY HEART VALVE TEAM  Inpatient MitraClip Consultation:   Patient ID: Aaron [name]; [id number]; Aug 25, 1985   Admit date: 07/09/2023 Date of Consult: 07/15/2023  Primary Care Provider: Patient, No Pcp Per Primary Cardiologist: Dr. Marnee Guarneri   Patient Profile:   LUCAN RINER is a 38 y.o. male with a hx of hyperthyroidism likely secondary to Graves' disease, hypertension, HFrEF, mitral valve prolapse with severe MR, paroxysmal atrial fibrillation and medical noncompliance who is being seen today for the evaluation of severe MR at the request of Dr. Elwyn Lade.  History of Present Illness:   Patient was admitted in 10/2021 for NSTEMI.  Echo 10/22/21 showed EF 55 to 60%, mild LV dilation, mild LVH, mild RVE, and bileaflet mitral valve prolapse (posterior>anterior) with mild MR. Left/right heart cath 10/22/21 showed no CAD and normal filling pressures.  There was a question of a left ventricular outflow versus intracavitary gradient of 45 mmHg. cMRI 10/24/23 showed LVEF 49% with small apical transmural LGE consistent with scar. He was noted to be hyperthyroid during this admission with an undetectable TSH and started on methimazole and metoprolol.   He was readmitted in 03/2022 with recurrent chest pain. Repeat echo 04/01/2022 showed EF 40 to 45%, global hypokinesis, mild LVH, normal RV, severe LAE, and a myxomatous, bileaflet mitral valve prolapse with eccentric, anteriorly directed, moderate to severe mitral valve regurgitation but severe MR by PISA.    Admitted in 03/2023 for atrial fibrillation with RVR.  He self converted on IV dilt.  He was noted to be noncompliant with his metoprolol as well as his methimazole.  Echo 03/20/2023 showed EF 50%, mild RV dysfunction, mild pulmonary hypertension, severe LAE, and severe MR.   Seen in the office on 03/27/2023 by Dr. Alvino Blood and reported feeling unwell with steadily worsening symptoms of dyspnea on exertion,  fatigue x several months. He continued to be noncompliant with medications. GDMT and possible eventual mitral valve repair were discussed during this visit. He had 1 month follow up in December that the pt cancelled.  He presented to [facility] on 07/09/23 with acute shortness of breath and chest pressure.  On admission he was discovered to have a small segmental pulmonary embolism as well as thyrotoxicosis. He was treated with IV heparin, multiple AV nodal blocking agents and IV fluids and became acutely short of breath. He ultimately went into pulseless VT followed by PEA arrest requiring 40 minutes of CPR. He was treated with TNK while CPR was in progress due to pulmonary embolus and intubated. Good mental status was confirmed post code.  He was transferred to [facility] for emergent VA ECMO and Impella CP placement. Post ECMO/impella TEE showed EF < 20%, severe RV dysfunction, mild prolapse of both leaflets of the mitral valve with mild MR. Pt developed AKI and hyperkalemia 2/2 ATN in the context of cardiac arrest and shock and placed on CRRT via his VA ECMO circuit. He also has had acute anemia requiring multiple blood transfusions. He underwent right axillary cannulation for placement of an Impella 5.5 left ventricular assist device by Dr. Dorris Fetch on 3/3 and right femoral Impella CP was removed.  Despite weaning down V-A ECMO flow with reduction in MAP to 55-60 & unloading with impella 5.5 @ P8, patient continued to have severe 4+ MR by bedside TTE. Structural heart is consulted for consideration of bail out mTEER.   Today the pt is seen laying in bed. He is intubated and sedated.  His fiance is present at bedside. They live together in [place]. 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 She reports that he has felt unwell for quite some time but became acutely fatigued over the past 1-2 weeks. She reports him being very stubborn and in  denial about medical issues. She was unaware of his valve problem until this admission. She reports remembering him cancelling his December follow up because he didn't want to "face his reality."   Past Medical History:  Diagnosis Date   Atrial fibrillation [facility])    Hyperthyroidism    Mitral regurgitation    NSTEMI (non-ST elevated myocardial infarction) [facility])     Past Surgical History:  Procedure Laterality Date   ECMO CANNULATION N/A 07/10/2023   Procedure: ECMO CANNULATION;  Surgeon: Dolores Patty, MD;  Location: [facility] INVASIVE CV LAB;  Service: Cardiovascular;  Laterality: N/A;   LEFT HEART CATH AND CORONARY ANGIOGRAPHY N/A 10/22/2021   Procedure: LEFT HEART CATH AND CORONARY ANGIOGRAPHY;  Surgeon: Yvonne Kendall, MD;  Location: [facility] INVASIVE CV LAB;  Service: Cardiovascular;  Laterality: N/A;   PLACEMENT OF IMPELLA LEFT VENTRICULAR ASSIST DEVICE Right 07/14/2023   Procedure: PLACEMENT OF RIGHT AXILLARY IMPELLA 5.5 LEFT VENTRICULAR ASSIST DEVICE;  Surgeon: Loreli Slot, MD;  Location: [facility] OR;  Service: Open Heart Surgery;  Laterality: Right;   REMOVAL OF IMPELLA LEFT VENTRICULAR ASSIST DEVICE Right 07/14/2023   Procedure: REMOVAL OF CP RIGHT FEMORAL IMPELLA LEFT VENTRICULAR ASSIST DEVICE;  Surgeon: Loreli Slot, MD;  Location: [facility] OR;  Service: Open Heart Surgery;  Laterality: Right;   VENTRICULAR ASSIST DEVICE INSERTION N/A 07/10/2023   Procedure: VENTRICULAR ASSIST DEVICE INSERTION;  Surgeon: Dolores Patty, MD;  Location: [facility] INVASIVE CV LAB;  Service: Cardiovascular;  Laterality: N/A;     Inpatient Medications: Scheduled Meds:  sodium chloride   Intravenous Once   bisacodyl  5 mg Oral Once   [START ON 07/16/2023] chlorhexidine  1 Application Topical Once   [START ON 07/16/2023] chlorhexidine  15 mL Mouth/Throat Once   Chlorhexidine Gluconate Cloth  6 each Topical Daily   clonazePAM  1 mg Per Tube BID   docusate  100 mg Per Tube BID   EPINEPHrine        EPINEPHrine NaCl       feeding supplement (PROSource TF20)  60 mL Per Tube TID   fentaNYL (SUBLIMAZE) injection  50 mcg Intravenous Once   fiber supplement (BANATROL TF)  60 mL Per Tube BID   folic acid  1 mg Per Tube Daily   hydrocortisone sod succinate (SOLU-CORTEF) inj  50 mg Intravenous Q12H   insulin aspart  0-20 Units Subcutaneous Q4H   insulin aspart  5 Units Subcutaneous Q4H   methimazole  10 mg Per Tube Q8H   metoCLOPramide (REGLAN) injection  10 mg Intravenous Q8H   multivitamin  1 tablet Per Tube QHS   mouth rinse  15 mL Mouth Rinse Q2H   oxyCODONE  5 mg Per Tube Q6H   pantoprazole (PROTONIX) IV  40 mg Intravenous QHS   polyethylene glycol  17 g Per Tube Daily   potassium chloride  40 mEq Per Tube BID   QUEtiapine  50 mg Per Tube BID   sodium bicarbonate  50 mEq Intravenous Once   sodium chloride flush  3 mL Intravenous Q12H   thiamine  100 mg Per Tube Daily   Or   thiamine  100 mg Intravenous Daily  Continuous Infusions:  albumin human Stopped (07/14/23 0301)   amiodarone 60 mg/hr (07/15/23 1500)   amiodarone     bivalirudin (ANGIOMAX) 250 mg in sodium chloride 0.9 % 500 mL (0.5 mg/mL) infusion 0.03 mg/kg/hr (07/15/23 1500)   dexmedetomidine (PRECEDEX) IV infusion 1.2 mcg/kg/hr (07/15/23 1500)   feeding supplement (PIVOT 1.5 CAL) 55 mL/hr at 07/15/23 1500   HYDROmorphone 1 mg/hr (07/15/23 1500)   meropenem (MERREM) IV Stopped (07/15/23 1438)   niCARDipine Stopped (07/15/23 0752)   prismasol BGK 4/2.5 1,500 mL/hr at 07/15/23 1239   prismasol BGK 4/2.5 400 mL/hr at 07/15/23 0316   prismasol BGK 4/2.5 340 mL/hr at 07/15/23 0843   sodium bicarbonate 25 mEq (Impella PURGE) in dextrose 5 % 1000 mL bag 5.8 mL/hr at 07/13/23 0248   vancomycin Stopped (07/15/23 1025)   vasopressin 0.04 Units/min (07/15/23 1542)   PRN Meds: acetaminophen **OR** acetaminophen, albumin human, EPINEPHrine, EPINEPHrine NaCl, heparin, HYDROmorphone, midazolam, mouth rinse,  temazepam  Allergies:   No Known Allergies  Social History:   Social History   Socioeconomic History   Marital status: Single    Spouse name: Not on file   Number of children: Not on file   Years of education: Not on file   Highest education level: Not on file  Occupational History   Not on file  Tobacco Use   Smoking status: Never   Smokeless tobacco: Never  Vaping Use   Vaping status: Never Used  Substance and Sexual Activity   Alcohol use: No   Drug use: Yes    Frequency: 3.0 times per week    Types: Marijuana   Sexual activity: Never  Other Topics Concern   Not on file  Social History Narrative   Not on file   Social Drivers of Health   Financial Resource Strain: Medium Risk (04/01/2023)   Overall Financial Resource Strain (CARDIA)    Difficulty of Paying Living Expenses: Somewhat hard  Food Insecurity: No Food Insecurity (07/10/2023)   Hunger Vital Sign    Worried About Running Out of Food in the Last Year: Never true    Ran Out of Food in the Last Year: Never true  Transportation Needs: No Transportation Needs (07/10/2023)   PRAPARE - Transportation    Lack of Transportation (Medical): No    Lack of Transportation (Non-Medical): No  Physical Activity: Not on file  Stress: Not on file  Social Connections: Not on file  Intimate Partner Violence: Not At Risk (07/10/2023)   Humiliation, Afraid, Rape, and Kick questionnaire    Fear of Current or Ex-Partner: No    Emotionally Abused: No    Physically Abused: No    Sexually Abused: No    Family History:   The patient's family history includes Heart disease in an other family member.  ROS:  Please see the history of present illness.  ROS  All other ROS reviewed and negative.     Physical Exam/Data:   Vitals:   07/15/23 1420 07/15/23 1421 07/15/23 1509 07/15/23 1510  BP:      Pulse:   62   Resp: 10 10 10    Temp: 97.7 F (36.5 C) 97.7 F (36.5 C) 97.9 F (36.6 C)   TempSrc:      SpO2:   100% 100%   Weight:      Height:        Intake/Output Summary (Last 24 hours) at 07/15/2023 1615 Last data filed at 07/15/2023 1500 Gross per 24 hour  Intake 4912.48 ml  Output 7831.2 ml  Net -2918.72 ml   Filed Weights   07/13/23 0600 07/14/23 0500 07/15/23 0500  Weight: 90.9 kg 86.6 kg 85.9 kg   Body mass index is 23.05 kg/m.  General:  sedated and intubated  Neck: no JVD Cardiac:  normal S1, S2; RRR; 2/6 holosytolic murmur at apex.  impella 5.5 axillary site without bleeding  Lungs:  mechanical lungs sounds Abd: soft, nontender, no hepatomegaly  Ext: mild LE edema. Femoral CP & ECMO cannulas sutured into place; no hematoma/bleeding Musculoskeletal:  No deformities Skin: warm and dry   Telemetry:  Telemetry was personally reviewed and demonstrates:  sinus with frequent PVCs and NSVT, HRs in 60s   Laboratory Data:  Chemistry Recent Labs  Lab 07/14/23 1603 07/14/23 1609 07/14/23 2100 07/14/23 2355 07/15/23 0415 07/15/23 0423 07/15/23 1610 07/15/23 9604 07/15/23 0738 07/15/23 1043 07/15/23 1202  NA 142   < > 138   < > 136   < > 140   < > 139 139 139  K 3.8   < > 3.1*   < > 3.8   < > 3.7   < > 3.4* 3.8 4.1  CL 95*  --  94*  --  94*  --  91*  --   --   --   --   CO2 35*  --  33*  --  29  --   --   --   --   --   --   GLUCOSE 165*  --  181*  --  170*  --  245*  --   --   --   --   BUN 55*  --  57*  --  56*  --  40*  --   --   --   --   CREATININE 3.65*  --  3.62*  --  3.63*  --  2.40*  --   --   --   --   CALCIUM 8.5*  --  8.5*  --  8.5*  --   --   --   --   --   --   GFRNONAA 21*  --  21*  --  21*  --   --   --   --   --   --   ANIONGAP 12  --  11  --  13  --   --   --   --   --   --    < > = values in this interval not displayed.    Recent Labs  Lab 07/13/23 0403 07/14/23 0327 07/14/23 1603 07/15/23 0415  PROT 4.7* NOT CALCULATED  --  5.1*  ALBUMIN 2.0* 2.2* 1.9* 2.1*  2.1*  AST 970* 767*  --  538*  ALT 355* 224*  --  138*  ALKPHOS 74 68  --  73  BILITOT 7.3* 7.9*   --  4.2*   Hematology Recent Labs  Lab 07/14/23 1603 07/14/23 1609 07/14/23 2100 07/14/23 2355 07/15/23 0415 07/15/23 0423 07/15/23 0738 07/15/23 1043 07/15/23 1202  WBC 27.4*  --  24.0*  --  23.8*  --   --   --   --   RBC 2.78*  --  3.00*  --  3.16*  --   --   --   --   HGB 7.9*   < > 8.4*   < > 8.6*   < > 8.2* 8.5* 8.8*  HCT 22.0*   < > 23.8*   < >  25.1*   < > 24.0* 25.0* 26.0*  MCV 79.1*  --  79.3*  --  79.4*  --   --   --   --   MCH 28.4  --  28.0  --  27.2  --   --   --   --   MCHC 35.9  --  35.3  --  34.3  --   --   --   --   RDW 18.8*  --  18.8*  --  19.9*  --   --   --   --   PLT 78*  --  82*  --  84*  --   --   --   --    < > = values in this interval not displayed.   Cardiac EnzymesNo results for input(s): "TROPONINI" in the last 168 hours. No results for input(s): "TROPIPOC" in the last 168 hours.  BNP Recent Labs  Lab 07/09/23 1934 07/10/23 1224  BNP 703.7* 980.5*    DDimer  Recent Labs  Lab 07/09/23 1934  DDIMER 1.83*     STS Risk Calculator:  Procedure Type: Isolated MVR Perioperative Outcome Estimate % Operative Mortality 13.3% Morbidity & Mortality 82.9% Stroke 5.83% Renal Failure NA Reoperation 15.3% Prolonged Ventilation 83.6% Deep Sternal Wound Infection 0.197% Long Hospital Stay (>14 days) 52.2% Short Hospital Stay (<6 days)* 1.68%  Procedure Type: Isolated MVr (repair) Perioperative Outcome Estimate % Operative Mortality 18.9% Morbidity & Mortality 84.4% Stroke 2.02% Renal Failure NA Reoperation 13.3% Prolonged Ventilation 87% Deep Sternal Wound Infection 0.13% Long Hospital Stay (>14 days) 63.5% Short Hospital Stay (<6 days)* 2.41%   KCCQ deferred as pt is intubated and sedated.    Assessment and Plan:   KRZYSZTOF REICHELT is a 38 y.o. male with symptoms of severe, stage D mitral regurgitation with NYHA Class IV symptoms currently admitted with cardiogenic shock requiring intubation, VA ECMO and Impella support after an in  hospital cardiac arrest. Hospital course complicated by acute PE on bivalrudin, NSVT on amiodarone, acute renal failure on CRRT, thyrotoxicosis on methimazole and IV steroids, and anemia requiring transfusions.   Left/right heart cath 10/22/21 showed no CAD and normal filling pressures.  There was a question of a left ventricular outflow versus intracavitary gradient of 45 mmHg. cMRI 10/24/23 showed LVEF 49% with small apical transmural LGE consistent with scar.   Echo 03/20/2023 showed EF 50%, mild RV dysfunction, mild pulmonary hypertension, severe LAE, and myxomatous, bileaflet MVP with posterior > anterior prolapse and severe, eccentric, anteriorly directed severe MR.  Post ECMO/impella TEE 07/10/23 showed EF < 20%, severe RV dysfunction, mild prolapse of both leaflets of the mitral valve with mild MR.   Despite weaning down V-A ECMO flow with reduction in MAP to 55-60 & unloading with impella 5.5 @ P8, patient continued to have severe 4+ MR by bedside TTE today.   We have reviewed the natural history of mitral regurgitation with the patient's family. We have discussed the limitations of medical therapy and the poor prognosis associated with symptomatic mitral regurgitation. We have also reviewed potential treatment options, including palliative medical therapy, conventional surgical mitral valve repair or replacement, and percutaneous mitral valve repair with MitraClip. We discussed treatment options in the context of this patient's specific comorbid medical conditions.    The patient's predicted risk of mortality with conventional mitral valve replacement/repair is 13.3 % / 18.9 % respectively, primarily based on severe LV dysfunction, cardiogenic shock, VF arrest, ARF, anemia, and PAF. Other significant  comorbid conditions medical non compliance, thyrotoxicosis, RV failure and acute PE.   Plan for inpatient mTEER with Dr. Excell Seltzer tomorrow at 11:30am. He will use left femoral venous access due to ECMO  cannula in the right vein. Inpatient mTEER orders placed. Pt's sister will sign consent form.      Signed, Cline Crock, PA-C  07/15/2023 4:15 PM  Patient seen, examined. Available data reviewed. Agree with findings, assessment, and plan as outlined by Carlean Jews, PA-C.  The patient is independently interviewed and examined.  At the time of my evaluation, there is no family present at the bedside.  The patient is intubated and sedated.  HEENT is otherwise normal, neck with normal JVP and no carotid bruits, lungs are clear to auscultation bilaterally, heart is regular rate and rhythm with a 3/6 holosystolic murmur at the apex, abdomen is soft and there are no masses, extremities have mild diffuse 1+ edema in the hands and feet bilaterally, skin is warm and dry with no rash.  The patient has indwelling ECMO cannulas with the venous cannula on the right and an arterial cannula on the left with an antegrade sheath present.  I have reviewed all of his hospital data including notes of the heart failure team, transesophageal echo images, a CT angiogram of the chest that showed segmental bilateral lower lobe pulmonary emboli and patchy groundglass opacities in the left lower lobe, lab data that demonstrates a creatinine of 3.48 (patient on CRRT), hemoglobin of 8.9, platelet count of 84,000, lactate of 0.9.  The patient is an refractory cardiogenic shock requiring VA-ECMO support and an Impella 5.5.  He has severe residual mitral regurgitation which appears to be secondary to bileaflet prolapse with a flail posterior leaflet.  I suspect this contributed to his presentation with low output heart failure, cardiogenic shock, and ultimately cardiac arrest once he developed acute pulmonary edema complicated by hypoxemia.  He now is dependent on mechanical support with limited treatment options.  Attempts were made to wean his flows today and unload him with the Impella device, but he continued to demonstrate 4+  mitral regurgitation by real-time bedside TEE.  Transcatheter edge-to-edge repair is a reasonable consideration as a bridge to more definitive therapy in this otherwise young man.  His comorbidities are outlined above.  He has significant complication risk and undergoing mitral TEER with refractory cardiogenic shock on hemodynamic support.  However, this appears to be his best short-term option to try to wean him and bridge him to a more definitive treatment.  I have discussed his case extensively with advanced heart failure colleagues and we all agree that an attempt at mitral valve repair with transcatheter edge-to-edge repair is appropriate.  I called the patient's sister who is his medical power of attorney.  I discussed the natural history of severe mitral regurgitation in the context of cardiogenic shock with her.  We reviewed the risks, indications, and alternatives to the MitraClip procedure. We discussed treatment options in the context of this patient's specific comorbid medical conditions. She is counseled specifically about the risks, indications, and alternatives to percutaneous mitral valve repair with MitraClip. Specific risks include vascular injury, bleeding, infection, arrhythmia, myocardial infarction, stroke, cardiac perforation, cardiac tamponade, device embolization, single leaflet detachment, endocarditis, mitral valve injury, emergency surgery, and death. They understand the risk of serious complication occurs at a rate of approximately 2% in most case series.  However, she understands these risks are significantly increased in this clinical context. After this discussion, the patient's sister provides  full informed consent for surgery.  She will come in this evening after work to sign consent.  We will plan to do the procedure tomorrow under transesophageal echo guidance.  Tonny Bollman, M.D. 07/15/2023 6:00 PM

## 2023-07-15 NOTE — Progress Notes (Signed)
 PHARMACY - ANTICOAGULATION CONSULT NOTE  Pharmacy Consult for bivalirudin Indication:  ECMO, Impella  No Known Allergies  Patient Measurements: Height: 6\' 4"  (193 cm) Weight: 85.9 kg (189 lb 6 oz) IBW/kg (Calculated) : 86.8 Heparin Dosing Weight: n/a  Vital Signs: Temp: 98.2 F (36.8 C) (03/04 0744) Temp Source: Core (03/04 0000) Pulse Rate: 69 (03/04 0744)  Labs: Recent Labs    07/13/23 0403 07/13/23 0409 07/14/23 0327 07/14/23 0732 07/14/23 1603 07/14/23 1609 07/14/23 2100 07/14/23 2355 07/15/23 0415 07/15/23 0423 07/15/23 0428 07/15/23 0605  HGB 8.0*   < > 7.2*   < > 7.9*   < > 8.4*   < > 8.6* 9.2* 10.9* 8.5*  HCT 22.9*   < > 20.1*   < > 22.0*   < > 23.8*   < > 25.1* 27.0* 32.0* 25.0*  PLT 88*   < > 87*  --  78*  --  82*  --  84*  --   --   --   APTT 60*   < > 58*  --  42*  --   --   --  32  --   --   --   LABPROT 18.6*  --  17.1*  --   --   --   --   --  14.2  --   --   --   INR 1.5*  --  1.4*  --   --   --   --   --  1.1  --   --   --   CREATININE 3.09*   < > 3.26*   < > 3.65*  --  3.62*  --  3.63*  --  2.40*  --    < > = values in this interval not displayed.    Estimated Creatinine Clearance: 50.7 mL/min (A) (by C-G formula based on SCr of 2.4 mg/dL (H)).   Medical History: Past Medical History:  Diagnosis Date   Atrial fibrillation [facility])    Hyperthyroidism    Mitral regurgitation    NSTEMI (non-ST elevated myocardial infarction) [facility])     Assessment: 38 yo M with bilateral PE s/p TNK (2/27 @1156 ) now on [place] ECMO + Impella. Pharmacy consulted for bivalirudin for anticoagulation.   S/p impella CP >5.5 3/3 bivalirudin held with oozing at insertion site until about midnight - now resolved Hgb 7 -8 stable Plt 80s - low stable, LDH >2500, fibrinogen 3080s stable  LFTs trending downward - total bilirubin similar at 7.9, INR 1.1. Requiring PRBCs. Remains on CRRT - Minimal fibrin in ECMO circuit.   Resume bivalirudin drip 0.03mg /kg/hr (wt 97.5kg)   Goal  of Therapy:  aPTT 50-70 seconds Monitor platelets by anticoagulation protocol: Yes   Plan:  Restart bivalirudin 0.03mg /kg/hr (using dose weight 97.5 kg) aPTT q12h F/u CBC, LDH, fibrinogen Monitor s/s bleeding     Leota Sauers Pharm.D. CPP, BCPS Clinical Pharmacist [phone] 07/15/2023 9:56 AM   Please check AMION for all [facility] Pharmacy phone numbers After 10:00 PM, call Main Pharmacy [phone]

## 2023-07-15 NOTE — CV Procedure (Signed)
 ECMO NOTE:   Indication: Cardiogenic shock   Initial cannulation date: 07/10/23   ECMO type: VA ECMO with Impella CP vent   Dual lumen inflow/return cannula:   1) 25 FR multi-stage venous drainage RFV 2) 21 FR arterial return LCFA 3) 6FR L SFA distal perfusion catheter 4) Impella CP via R CFA LV vent    Daily data:   Flow 4.0L RPM 3400 Sweep  3.5L Blender at 100%   Impella at P2 1.6L flow   Labs:   ABG    Component Value Date/Time   PHART 7.462 (H) 07/15/2023 0605   PCO2ART 49.3 (H) 07/15/2023 0605   PO2ART 140 (H) 07/15/2023 0605   HCO3 35.5 (H) 07/15/2023 0605   TCO2 37 (H) 07/15/2023 0605   ACIDBASEDEF 1.0 07/11/2023 1314   O2SAT 99 07/15/2023 0605    Hgb 8.6 Platelets 84 LDH >2500 PTT 32 discussed dosing with pharmacy Lactic acid 0.9   Plan:  - Continue V-A ECMO support.  - Plan for weaning trial today.  - Continue to pull aggressively on CRRT.  - Discussed with pharmD & ECLS team.    Dorthula Nettles, DO  9:47 AM

## 2023-07-15 NOTE — Procedures (Signed)
 Admit: 07/09/2023 LOS: 6  60M AKI likely ATN after SCA, cardiogenic shock on ECMO and Impella, AHRF/VDRF  Current CRRT Prescription: Start Date: 07/11/23 Catheter: Using ECMO circuit BFR: 115 Pre Blood Pump: 4K DFR: 1500 4K Replacement Rate: 400 4K Goal UF: -52mL/h Anticoagulation: citrate Clotting: none  S: To OR yesterday for Impella Remains on ECMO K 3.7 on 4K bath,  P 2.2 Moving to angiomax, stopping citrate Fiance at bedside, updated  O: 03/03 0701 - 03/04 0700 In: 5528 [I.V.:2866.7; Blood:910; NG/GT:964.3; IV Piggyback:599.8] Out: 5785 [Urine:18; Blood:350]  Filed Weights   07/13/23 0600 07/14/23 0500 07/15/23 0500  Weight: 90.9 kg 86.6 kg 85.9 kg    Recent Labs  Lab 07/14/23 0327 07/14/23 0732 07/14/23 1603 07/14/23 1609 07/14/23 2100 07/14/23 2355 07/15/23 0415 07/15/23 0423 07/15/23 0428 07/15/23 0605  NA 137   < > 142   < > 138   < > 136 137 140 139  K 4.0   < > 3.8   < > 3.1*   < > 3.8 3.8 3.7 3.7  CL 92*   < > 95*  --  94*  --  94*  --  91*  --   CO2 34*  --  35*  --  33*  --  29  --   --   --   GLUCOSE 219*   < > 165*  --  181*  --  170*  --  245*  --   BUN 46*   < > 55*  --  57*  --  56*  --  40*  --   CREATININE 3.26*   < > 3.65*  --  3.62*  --  3.63*  --  2.40*  --   CALCIUM 8.9  --  8.5*  --  8.5*  --  8.5*  --   --   --   PHOS 2.5  --  3.2  --   --   --  2.2*  --   --   --    < > = values in this interval not displayed.   Recent Labs  Lab 07/14/23 1603 07/14/23 1609 07/14/23 2100 07/14/23 2355 07/15/23 0415 07/15/23 0423 07/15/23 0428 07/15/23 0605  WBC 27.4*  --  24.0*  --  23.8*  --   --   --   HGB 7.9*   < > 8.4*   < > 8.6* 9.2* 10.9* 8.5*  HCT 22.0*   < > 23.8*   < > 25.1* 27.0* 32.0* 25.0*  MCV 79.1*  --  79.3*  --  79.4*  --   --   --   PLT 78*  --  82*  --  84*  --   --   --    < > = values in this interval not displayed.    Scheduled Meds:  sodium chloride   Intravenous Once   amiodarone  150 mg Intravenous Once    Chlorhexidine Gluconate Cloth  6 each Topical Daily   clonazePAM  1 mg Per Tube BID   docusate  100 mg Per Tube BID   EPINEPHrine       EPINEPHrine NaCl       feeding supplement (PROSource TF20)  60 mL Per Tube TID   fentaNYL (SUBLIMAZE) injection  50 mcg Intravenous Once   fiber supplement (BANATROL TF)  60 mL Per Tube BID   folic acid  1 mg Per Tube Daily   hydrocortisone sod succinate (SOLU-CORTEF) inj  50 mg Intravenous Q12H   insulin aspart  0-20 Units Subcutaneous Q4H   insulin aspart  5 Units Subcutaneous Q4H   methimazole  10 mg Per Tube Q8H   metoCLOPramide (REGLAN) injection  10 mg Intravenous Q8H   multivitamin  1 tablet Per Tube QHS   mouth rinse  15 mL Mouth Rinse Q2H   oxyCODONE  5 mg Per Tube Q6H   pantoprazole (PROTONIX) IV  40 mg Intravenous QHS   polyethylene glycol  17 g Per Tube Daily   potassium chloride  40 mEq Per Tube BID   QUEtiapine  50 mg Per Tube BID   sodium bicarbonate  50 mEq Intravenous Once   sodium chloride flush  3 mL Intravenous Q12H   thiamine  100 mg Per Tube Daily   Or   thiamine  100 mg Intravenous Daily   Continuous Infusions:  albumin human Stopped (07/14/23 0301)   amiodarone     amiodarone     amiodarone     bivalirudin (ANGIOMAX) 250 mg in sodium chloride 0.9 % 500 mL (0.5 mg/mL) infusion 0.03 mg/kg/hr (07/15/23 1000)   dexmedetomidine (PRECEDEX) IV infusion 1.2 mcg/kg/hr (07/15/23 1039)   feeding supplement (PIVOT 1.5 CAL) 45 mL/hr at 07/15/23 1000   HYDROmorphone 1 mg/hr (07/15/23 1000)   meropenem (MERREM) IV Stopped (07/15/23 0554)   niCARDipine Stopped (07/15/23 0752)   prismasol BGK 4/2.5 1,500 mL/hr at 07/15/23 0924   prismasol BGK 4/2.5 400 mL/hr at 07/15/23 0316   prismasol BGK 4/2.5 340 mL/hr at 07/15/23 0843   sodium bicarbonate 25 mEq (Impella PURGE) in dextrose 5 % 1000 mL bag 5.8 mL/hr at 07/13/23 0248   vancomycin 200 mL/hr at 07/15/23 1000   vasopressin 0.04 Units/min (07/15/23 1024)   PRN Meds:.acetaminophen  **OR** acetaminophen, albumin human, amiodarone, EPINEPHrine, EPINEPHrine NaCl, heparin, HYDROmorphone, midazolam, mouth rinse  ABG    Component Value Date/Time   PHART 7.462 (H) 07/15/2023 0605   PCO2ART 49.3 (H) 07/15/2023 0605   PO2ART 140 (H) 07/15/2023 0605   HCO3 35.5 (H) 07/15/2023 0605   TCO2 37 (H) 07/15/2023 0605   ACIDBASEDEF 1.0 07/11/2023 1314   O2SAT 99 07/15/2023 0605   Inbutaed, sedated Cont hum  Coarse bs Sedated Ill appearing  A/P  Dialysis dependent anuric AKI 2/2 ATN from #2 and #3 on CRRT Cardiogenic Shock on ECMO + Impella VT + PEA SCA  PE on angiomax per CCM Anemia, Hb stable, per AHF Hyperkalemia,resolved wit hCRRT  Cont CRRT at current settings, trial of angiomax stopping citrate today  D/w CCM MD, clinical pharmacy, and ICU RN.    Sabra Heck, MD [facility] pgr [phone]

## 2023-07-15 NOTE — Progress Notes (Addendum)
 PHARMACY - ANTICOAGULATION CONSULT NOTE  Pharmacy Consult for bivalirudin Indication:  ECMO, Impella  No Known Allergies  Patient Measurements: Height: 6\' 4"  (193 cm) Weight: 85.9 kg (189 lb 6 oz) IBW/kg (Calculated) : 86.8 Heparin Dosing Weight: n/a  Vital Signs: Temp: 98.2 F (36.8 C) (03/04 1643) Pulse Rate: 61 (03/04 1548)  Labs: Recent Labs    07/13/23 0403 07/13/23 0409 07/14/23 0327 07/14/23 0732 07/14/23 1603 07/14/23 1609 07/14/23 2100 07/14/23 2355 07/15/23 0415 07/15/23 0423 07/15/23 0428 07/15/23 0605 07/15/23 1043 07/15/23 1202 07/15/23 1600 07/15/23 1602  HGB 8.0*   < > 7.2*   < > 7.9*   < > 8.4*   < > 8.6*   < > 10.9*   < > 8.5* 8.8*  --  8.9*  HCT 22.9*   < > 20.1*   < > 22.0*   < > 23.8*   < > 25.1*   < > 32.0*   < > 25.0* 26.0*  --  26.7*  PLT 88*   < > 87*  --  78*  --  82*  --  84*  --   --   --   --   --   --  84*  APTT 60*   < > 58*  --  42*  --   --   --  32  --   --   --   --   --   --  59*  LABPROT 18.6*  --  17.1*  --   --   --   --   --  14.2  --   --   --   --   --   --   --   INR 1.5*  --  1.4*  --   --   --   --   --  1.1  --   --   --   --   --   --   --   CREATININE 3.09*   < > 3.26*   < > 3.65*  --  3.62*  --  3.63*  --  2.40*  --   --   --  3.48*  --    < > = values in this interval not displayed.    Estimated Creatinine Clearance: 35 mL/min (A) (by C-G formula based on SCr of 3.48 mg/dL (H)).   Medical History: Past Medical History:  Diagnosis Date   Atrial fibrillation [facility])    Hyperthyroidism    Mitral regurgitation    NSTEMI (non-ST elevated myocardial infarction) [facility])     Assessment: 38 yo M with bilateral PE s/p TNK (2/27 @1156 ) now on [place] ECMO + Impella. Pharmacy consulted for bivalirudin for anticoagulation.   S/p impella CP >5.5 3/3 bivalirudin held with oozing at insertion site until about midnight - now resolved Hgb 7 -8 stable Plt 80s - low stable, LDH >2500, fibrinogen 3080s stable  LFTs trending downward -  total bilirubin similar at 7.9, INR 1.1. Requiring PRBCs. Remains on CRRT - Minimal fibrin in ECMO circuit.   2nd shift update: Hb 8.9, plt 84, aPTT therapeutic at 59 s/p resuming bival. No issues with bleeding / circuit per RN.    Goal of Therapy:  aPTT 50-70 seconds Monitor platelets by anticoagulation protocol: Yes   Plan:  Continue bivalirudin 0.03mg /kg/hr (using dose weight 97.5 kg) aPTT q12h F/u CBC, LDH, fibrinogen Monitor s/s bleeding    Cedric Fishman, PharmD, BCPS, BCCCP Clinical Pharmacist

## 2023-07-15 NOTE — Progress Notes (Signed)
 Daily Progress Note   Patient Name: Aaron [name]       Date: 07/15/2023 DOB: Mar 04, 1986  Age: 38 y.o. MRN#: [id number] Attending Physician: Romie Minus, MD Primary Care Physician: Patient, No Pcp Per Admit Date: 07/09/2023 Length of Stay: 6 days  Reason for Consultation/Follow-up: Establishing goals of care and Psychosocial/spiritual support  HPI/Patient Profile:  38 y.o. male  with past medical history of paroxysmal a-fib not adherent to Eliquis, chronic HFpEF, severe mitral regurgitation, and hyperthyroidism admitted on 07/09/2023 to [facility] with worsening shortness of breath, chest pain, and atrial fibrillation with RVR.    CTA showed segmental bilateral lower lobe pulmonary emboli and heparin started.   On 2/27 patient rapidly deteriorated developed V. tach/V-fib cardiac arrest. ROSC achieved after ~ 16 minutes of down time. Patient was intubated. Cardiology was consulted patient was placed on [place] ECMO and was transferred to [facility].  Palliative medicine was consulted for GOC conversations, support of patient/family.  Subjective:   Subjective: Chart Reviewed. Updates received. Patient Assessed. Created space and opportunity for patient  and family to explore thoughts and feelings regarding current medical situation.  Today's Discussion: Today saw the patient at bedside, no family is present.  I extensively discussed the patient with bedside nursing and PCCM physician Dr. Merrily Pew.  Yesterday the patient was brought to the OR and Impella CP was changed to Impella 5.5 to improve flow and increase likelihood of ECMO wean.  Still on VA ECMO, attempted to wean today but did not tolerate because of severe mitral regurgitation and arrhythmias for which amiodarone was added.  Heart failure is discussing with the structural heart team about possibly doing TEER.  He remains on CRRT.  Thus far the plan is tomorrow to do a mitral valve clip and again try to wean.  If unable  to wean despite mitral valve clip then would convert VA ECMO to VP ECMO.  When I saw the patient at the bedside no family was present.  The patient is intubated and sedated.  Later in today I called the patient's sister Aaron [name].  I expressed the palliative role currently is primarily around providing support for patient and family, helping with explained complex information, help in making complex decisions.  At this point they are in a good position, they just got off the phone with cardiology Dr. Excell Seltzer who will be doing a mitral valve clip tomorrow and she feels thoroughly educated.  She has some experience in healthcare as she works in Forensic scientist.  We discussed how it is "weird" being in healthcare and being on "the other side of the conversation."  I shared that her brother is in a good place for the health conditions he has and the team taking care of him is excellent.  She agreed.  His sister Aaron [name] did ask if they could be updated during the procedure if it tends to go longer than 2 hours.  Even if it simply "things are still going well, but were still working."  I shared that I would pass this request on to the medical team but cannot guarantee as it would depend on the complexity of the situation at that time.  She understands.  I ensured she had contact information for palliative care.  I shared that I would check back in in 2 or 3 days, allowing time for the mitral valve clip procedure and further attempts at weaning.  However, I offered that she can reach out to our  team at any point for any questions, concerns, needs. I provided emotional and general support through therapeutic listening, empathy, sharing of stories, and other techniques. I answered all questions and addressed all concerns to the best of my ability.  Review of Systems  Unable to perform ROS: Intubated    Objective:   Vital Signs:  BP (!) 86/73   Pulse 70   Temp 98.2 F (36.8 C)   Resp 10   Ht 6\' 4"   (1.93 m)   Wt 85.9 kg   SpO2 100%   BMI 23.05 kg/m   Physical Exam Vitals and nursing note reviewed.  Constitutional:      General: He is sleeping. He is not in acute distress.    Appearance: He is ill-appearing.     Interventions: He is sedated and intubated.  HENT:     Head: Normocephalic and atraumatic.  Cardiovascular:     Rate and Rhythm: Normal rate.  Pulmonary:     Effort: Pulmonary effort is normal. No respiratory distress. He is intubated.     Breath sounds: No wheezing or rhonchi.  Abdominal:     General: Abdomen is flat. Bowel sounds are normal. There is no distension.     Palpations: Abdomen is soft.  Skin:    General: Skin is warm and dry.     Palliative Assessment/Data: 10%    Existing Vynca/ACP Documentation: None  Assessment & Plan:   Impression: Present on Admission:  Bilateral pulmonary embolism (HCC)  Hyperthyroidism  Chronic heart failure with preserved ejection fraction (HFpEF) (HCC)  Severe mitral regurgitation  Paroxysmal atrial fibrillation (HCC)  SUMMARY OF RECOMMENDATIONS   Full code Full scope of care Anticipate MV clip procedure tomorrow Please update patient's family during the procedure if at all possible Palliative medicine will continue to follow and support  Symptom Management:  Per primary team PMT is available to assist as needed  Code Status: Full code  Prognosis: Unable to determine  Discharge Planning: To Be Determined  Discussed with: Patient's family, medical team, nursing team  Thank you for allowing [place] to participate in the care of Aaron [name] PMT will continue to support holistically.  Time Total: 62 min  Detailed review of medical records (labs, imaging, vital signs), medically appropriate exam, discussed with treatment team, counseling and education to patient, family, & staff, documenting clinical information, medication management, coordination of care  Wynne Dust, NP Palliative Medicine Team  Team  Phone # [phone] (Nights/Weekends)  01/09/2021, 8:17 AM

## 2023-07-16 ENCOUNTER — Inpatient Hospital Stay (HOSPITAL_COMMUNITY)

## 2023-07-16 ENCOUNTER — Inpatient Hospital Stay (HOSPITAL_COMMUNITY): Payer: Self-pay | Admitting: Certified Registered Nurse Anesthetist

## 2023-07-16 ENCOUNTER — Encounter (HOSPITAL_COMMUNITY): Payer: Self-pay | Admitting: Cardiovascular Disease

## 2023-07-16 ENCOUNTER — Encounter (HOSPITAL_COMMUNITY)
Admission: EM | Disposition: A | Discharge status: Rehabilitation Facility | Payer: Self-pay | Source: Home / Self Care | Attending: Cardiology

## 2023-07-16 DIAGNOSIS — R0602 Shortness of breath: Secondary | ICD-10-CM | POA: Diagnosis not present

## 2023-07-16 DIAGNOSIS — R570 Cardiogenic shock: Secondary | ICD-10-CM | POA: Diagnosis not present

## 2023-07-16 DIAGNOSIS — I480 Paroxysmal atrial fibrillation: Secondary | ICD-10-CM | POA: Diagnosis not present

## 2023-07-16 DIAGNOSIS — E875 Hyperkalemia: Secondary | ICD-10-CM | POA: Diagnosis not present

## 2023-07-16 DIAGNOSIS — N170 Acute kidney failure with tubular necrosis: Secondary | ICD-10-CM | POA: Diagnosis not present

## 2023-07-16 DIAGNOSIS — R079 Chest pain, unspecified: Secondary | ICD-10-CM | POA: Diagnosis not present

## 2023-07-16 DIAGNOSIS — Z452 Encounter for adjustment and management of vascular access device: Secondary | ICD-10-CM | POA: Diagnosis not present

## 2023-07-16 DIAGNOSIS — I5032 Chronic diastolic (congestive) heart failure: Secondary | ICD-10-CM

## 2023-07-16 DIAGNOSIS — I2699 Other pulmonary embolism without acute cor pulmonale: Secondary | ICD-10-CM | POA: Diagnosis not present

## 2023-07-16 DIAGNOSIS — Z006 Encounter for examination for normal comparison and control in clinical research program: Secondary | ICD-10-CM

## 2023-07-16 DIAGNOSIS — I110 Hypertensive heart disease with heart failure: Secondary | ICD-10-CM

## 2023-07-16 DIAGNOSIS — I340 Nonrheumatic mitral (valve) insufficiency: Secondary | ICD-10-CM

## 2023-07-16 DIAGNOSIS — I252 Old myocardial infarction: Secondary | ICD-10-CM | POA: Diagnosis not present

## 2023-07-16 DIAGNOSIS — R918 Other nonspecific abnormal finding of lung field: Secondary | ICD-10-CM | POA: Diagnosis not present

## 2023-07-16 DIAGNOSIS — I469 Cardiac arrest, cause unspecified: Secondary | ICD-10-CM | POA: Diagnosis not present

## 2023-07-16 DIAGNOSIS — I517 Cardiomegaly: Secondary | ICD-10-CM | POA: Diagnosis not present

## 2023-07-16 DIAGNOSIS — Z4682 Encounter for fitting and adjustment of non-vascular catheter: Secondary | ICD-10-CM | POA: Diagnosis not present

## 2023-07-16 DIAGNOSIS — R0989 Other specified symptoms and signs involving the circulatory and respiratory systems: Secondary | ICD-10-CM | POA: Diagnosis not present

## 2023-07-16 DIAGNOSIS — E872 Acidosis, unspecified: Secondary | ICD-10-CM | POA: Diagnosis not present

## 2023-07-16 DIAGNOSIS — J9601 Acute respiratory failure with hypoxia: Secondary | ICD-10-CM | POA: Diagnosis not present

## 2023-07-16 HISTORY — PX: TRANSCATHETER MITRAL EDGE TO EDGE REPAIR: CATH118311

## 2023-07-16 HISTORY — PX: TRANSESOPHAGEAL ECHOCARDIOGRAM (CATH LAB): EP1270

## 2023-07-16 LAB — CBC
HCT: 27.6 % — ABNORMAL LOW (ref 39.0–52.0)
HCT: 25.1 % — ABNORMAL LOW (ref 39.0–52.0)
Hemoglobin: 9.2 g/dL — ABNORMAL LOW (ref 13.0–17.0)
Hemoglobin: 8.1 g/dL — ABNORMAL LOW (ref 13.0–17.0)
MCH: 28 pg (ref 26.0–34.0)
MCH: 27.8 pg (ref 26.0–34.0)
MCHC: 33.3 g/dL (ref 30.0–36.0)
MCHC: 32.3 g/dL (ref 30.0–36.0)
MCV: 84.1 fL (ref 80.0–100.0)
MCV: 86.3 fL (ref 80.0–100.0)
Platelets: 92 10*3/uL — ABNORMAL LOW (ref 150–400)
Platelets: 81 10*3/uL — ABNORMAL LOW (ref 150–400)
RBC: 3.28 MIL/uL — ABNORMAL LOW (ref 4.22–5.81)
RBC: 2.91 MIL/uL — ABNORMAL LOW (ref 4.22–5.81)
RDW: 22.1 % — ABNORMAL HIGH (ref 11.5–15.5)
RDW: 22.3 % — ABNORMAL HIGH (ref 11.5–15.5)
WBC: 23 10*3/uL — ABNORMAL HIGH (ref 4.0–10.5)
WBC: 23.6 10*3/uL — ABNORMAL HIGH (ref 4.0–10.5)
nRBC: 0.3 % — ABNORMAL HIGH (ref 0.0–0.2)
nRBC: 0.2 % (ref 0.0–0.2)

## 2023-07-16 LAB — POCT I-STAT 7, (LYTES, BLD GAS, ICA,H+H)
Acid-Base Excess: 0 mmol/L (ref 0.0–2.0)
Acid-Base Excess: 0 mmol/L (ref 0.0–2.0)
Acid-Base Excess: 0 mmol/L (ref 0.0–2.0)
Acid-Base Excess: 1 mmol/L (ref 0.0–2.0)
Acid-Base Excess: 2 mmol/L (ref 0.0–2.0)
Acid-Base Excess: 3 mmol/L — ABNORMAL HIGH (ref 0.0–2.0)
Acid-Base Excess: 4 mmol/L — ABNORMAL HIGH (ref 0.0–2.0)
Acid-base deficit: 1 mmol/L (ref 0.0–2.0)
Acid-base deficit: 1 mmol/L (ref 0.0–2.0)
Bicarbonate: 24.3 mmol/L (ref 20.0–28.0)
Bicarbonate: 24.8 mmol/L (ref 20.0–28.0)
Bicarbonate: 24.9 mmol/L (ref 20.0–28.0)
Bicarbonate: 25.4 mmol/L (ref 20.0–28.0)
Bicarbonate: 25.8 mmol/L (ref 20.0–28.0)
Bicarbonate: 26.4 mmol/L (ref 20.0–28.0)
Bicarbonate: 26.7 mmol/L (ref 20.0–28.0)
Bicarbonate: 27.5 mmol/L (ref 20.0–28.0)
Bicarbonate: 28.8 mmol/L — ABNORMAL HIGH (ref 20.0–28.0)
Calcium, Ion: 1.04 mmol/L — ABNORMAL LOW (ref 1.15–1.40)
Calcium, Ion: 1.04 mmol/L — ABNORMAL LOW (ref 1.15–1.40)
Calcium, Ion: 1.04 mmol/L — ABNORMAL LOW (ref 1.15–1.40)
Calcium, Ion: 1.05 mmol/L — ABNORMAL LOW (ref 1.15–1.40)
Calcium, Ion: 1.06 mmol/L — ABNORMAL LOW (ref 1.15–1.40)
Calcium, Ion: 1.06 mmol/L — ABNORMAL LOW (ref 1.15–1.40)
Calcium, Ion: 1.06 mmol/L — ABNORMAL LOW (ref 1.15–1.40)
Calcium, Ion: 1.07 mmol/L — ABNORMAL LOW (ref 1.15–1.40)
Calcium, Ion: 1.07 mmol/L — ABNORMAL LOW (ref 1.15–1.40)
HCT: 23 % — ABNORMAL LOW (ref 39.0–52.0)
HCT: 24 % — ABNORMAL LOW (ref 39.0–52.0)
HCT: 25 % — ABNORMAL LOW (ref 39.0–52.0)
HCT: 26 % — ABNORMAL LOW (ref 39.0–52.0)
HCT: 27 % — ABNORMAL LOW (ref 39.0–52.0)
HCT: 27 % — ABNORMAL LOW (ref 39.0–52.0)
HCT: 28 % — ABNORMAL LOW (ref 39.0–52.0)
HCT: 28 % — ABNORMAL LOW (ref 39.0–52.0)
HCT: 28 % — ABNORMAL LOW (ref 39.0–52.0)
Hemoglobin: 7.8 g/dL — ABNORMAL LOW (ref 13.0–17.0)
Hemoglobin: 8.2 g/dL — ABNORMAL LOW (ref 13.0–17.0)
Hemoglobin: 8.5 g/dL — ABNORMAL LOW (ref 13.0–17.0)
Hemoglobin: 8.8 g/dL — ABNORMAL LOW (ref 13.0–17.0)
Hemoglobin: 9.2 g/dL — ABNORMAL LOW (ref 13.0–17.0)
Hemoglobin: 9.2 g/dL — ABNORMAL LOW (ref 13.0–17.0)
Hemoglobin: 9.5 g/dL — ABNORMAL LOW (ref 13.0–17.0)
Hemoglobin: 9.5 g/dL — ABNORMAL LOW (ref 13.0–17.0)
Hemoglobin: 9.5 g/dL — ABNORMAL LOW (ref 13.0–17.0)
O2 Saturation: 82 %
O2 Saturation: 96 %
O2 Saturation: 97 %
O2 Saturation: 98 %
O2 Saturation: 98 %
O2 Saturation: 98 %
O2 Saturation: 98 %
O2 Saturation: 98 %
O2 Saturation: 99 %
Patient temperature: 36.2
Patient temperature: 36.2
Patient temperature: 36.2
Patient temperature: 36.3
Patient temperature: 36.6
Patient temperature: 36.7
Patient temperature: 36.7
Potassium: 4.6 mmol/L (ref 3.5–5.1)
Potassium: 4.8 mmol/L (ref 3.5–5.1)
Potassium: 4.8 mmol/L (ref 3.5–5.1)
Potassium: 5 mmol/L (ref 3.5–5.1)
Potassium: 5 mmol/L (ref 3.5–5.1)
Potassium: 5 mmol/L (ref 3.5–5.1)
Potassium: 5.1 mmol/L (ref 3.5–5.1)
Potassium: 5.2 mmol/L — ABNORMAL HIGH (ref 3.5–5.1)
Potassium: 5.3 mmol/L — ABNORMAL HIGH (ref 3.5–5.1)
Sodium: 136 mmol/L (ref 135–145)
Sodium: 137 mmol/L (ref 135–145)
Sodium: 138 mmol/L (ref 135–145)
Sodium: 138 mmol/L (ref 135–145)
Sodium: 139 mmol/L (ref 135–145)
Sodium: 139 mmol/L (ref 135–145)
Sodium: 139 mmol/L (ref 135–145)
Sodium: 139 mmol/L (ref 135–145)
Sodium: 139 mmol/L (ref 135–145)
TCO2: 26 mmol/L (ref 22–32)
TCO2: 26 mmol/L (ref 22–32)
TCO2: 26 mmol/L (ref 22–32)
TCO2: 27 mmol/L (ref 22–32)
TCO2: 27 mmol/L (ref 22–32)
TCO2: 28 mmol/L (ref 22–32)
TCO2: 28 mmol/L (ref 22–32)
TCO2: 29 mmol/L (ref 22–32)
TCO2: 30 mmol/L (ref 22–32)
pCO2 arterial: 39 mmHg (ref 32–48)
pCO2 arterial: 39.5 mmHg (ref 32–48)
pCO2 arterial: 39.9 mmHg (ref 32–48)
pCO2 arterial: 40 mmHg (ref 32–48)
pCO2 arterial: 40.4 mmHg (ref 32–48)
pCO2 arterial: 42.3 mmHg (ref 32–48)
pCO2 arterial: 43.2 mmHg (ref 32–48)
pCO2 arterial: 45 mmHg (ref 32–48)
pCO2 arterial: 53.9 mmHg — ABNORMAL HIGH (ref 32–48)
pH, Arterial: 7.297 — ABNORMAL LOW (ref 7.35–7.45)
pH, Arterial: 7.373 (ref 7.35–7.45)
pH, Arterial: 7.38 (ref 7.35–7.45)
pH, Arterial: 7.387 (ref 7.35–7.45)
pH, Arterial: 7.409 (ref 7.35–7.45)
pH, Arterial: 7.409 (ref 7.35–7.45)
pH, Arterial: 7.413 (ref 7.35–7.45)
pH, Arterial: 7.436 (ref 7.35–7.45)
pH, Arterial: 7.444 (ref 7.35–7.45)
pO2, Arterial: 107 mmHg (ref 83–108)
pO2, Arterial: 110 mmHg — ABNORMAL HIGH (ref 83–108)
pO2, Arterial: 112 mmHg — ABNORMAL HIGH (ref 83–108)
pO2, Arterial: 126 mmHg — ABNORMAL HIGH (ref 83–108)
pO2, Arterial: 130 mmHg — ABNORMAL HIGH (ref 83–108)
pO2, Arterial: 45 mmHg — ABNORMAL LOW (ref 83–108)
pO2, Arterial: 84 mmHg (ref 83–108)
pO2, Arterial: 90 mmHg (ref 83–108)
pO2, Arterial: 96 mmHg (ref 83–108)

## 2023-07-16 LAB — TYPE AND SCREEN
ABO/RH(D): O POS
Antibody Screen: NEGATIVE
Unit division: 0
Unit division: 0
Unit division: 0
Unit division: 0
Unit division: 0
Unit division: 0
Unit division: 0
Unit division: 0
Unit division: 0

## 2023-07-16 LAB — GLUCOSE, CAPILLARY
Glucose-Capillary: 107 mg/dL — ABNORMAL HIGH (ref 70–99)
Glucose-Capillary: 113 mg/dL — ABNORMAL HIGH (ref 70–99)
Glucose-Capillary: 116 mg/dL — ABNORMAL HIGH (ref 70–99)
Glucose-Capillary: 131 mg/dL — ABNORMAL HIGH (ref 70–99)
Glucose-Capillary: 142 mg/dL — ABNORMAL HIGH (ref 70–99)
Glucose-Capillary: 149 mg/dL — ABNORMAL HIGH (ref 70–99)
Glucose-Capillary: 159 mg/dL — ABNORMAL HIGH (ref 70–99)

## 2023-07-16 LAB — ECHO TEE
Est EF: <20
Height: 76 in
Weight: 2825.42 [oz_av]

## 2023-07-16 LAB — RENAL FUNCTION PANEL
Albumin: 2.4 g/dL — ABNORMAL LOW (ref 3.5–5.0)
Albumin: 2.2 g/dL — ABNORMAL LOW (ref 3.5–5.0)
Anion gap: 8 (ref 5–15)
Anion gap: 10 (ref 5–15)
BUN: 63 mg/dL — ABNORMAL HIGH (ref 6–20)
BUN: 72 mg/dL — ABNORMAL HIGH (ref 6–20)
CO2: 28 mmol/L (ref 22–32)
CO2: 25 mmol/L (ref 22–32)
Calcium: 8.1 mg/dL — ABNORMAL LOW (ref 8.9–10.3)
Calcium: 7.7 mg/dL — ABNORMAL LOW (ref 8.9–10.3)
Chloride: 100 mmol/L (ref 98–111)
Chloride: 103 mmol/L (ref 98–111)
Creatinine, Ser: 3.61 mg/dL — ABNORMAL HIGH (ref 0.61–1.24)
Creatinine, Ser: 4.23 mg/dL — ABNORMAL HIGH (ref 0.61–1.24)
GFR, Estimated: 21 mL/min — ABNORMAL LOW (ref >60)
GFR, Estimated: 18 mL/min — ABNORMAL LOW (ref >60)
Glucose, Bld: 130 mg/dL — ABNORMAL HIGH (ref 70–99)
Glucose, Bld: 147 mg/dL — ABNORMAL HIGH (ref 70–99)
Phosphorus: 3.4 mg/dL (ref 2.5–4.6)
Phosphorus: 5.5 mg/dL — ABNORMAL HIGH (ref 2.5–4.6)
Potassium: 5.3 mmol/L — ABNORMAL HIGH (ref 3.5–5.1)
Potassium: 5.2 mmol/L — ABNORMAL HIGH (ref 3.5–5.1)
Sodium: 136 mmol/L (ref 135–145)
Sodium: 138 mmol/L (ref 135–145)

## 2023-07-16 LAB — POCT ACTIVATED CLOTTING TIME
Activated Clotting Time: 312 s
Activated Clotting Time: 256 s
Activated Clotting Time: 280 s
Activated Clotting Time: 299 s

## 2023-07-16 LAB — BPAM RBC
Blood Product Expiration Date: 202503182359
Blood Product Expiration Date: 202503182359
Blood Product Expiration Date: 202503192359
Blood Product Expiration Date: 202503212359
Blood Product Expiration Date: 202503212359
Blood Product Expiration Date: 202503212359
Blood Product Expiration Date: 202503212359
Blood Product Unit Number: 202503212359
Blood Product Unit Number: 202503212359
ISSUE DATE / TIME: 202503030403
ISSUE DATE / TIME: 202503030953
ISSUE DATE / TIME: 202503030953
ISSUE DATE / TIME: 202503030953
ISSUE DATE / TIME: 202503031708
ISSUE DATE / TIME: 202503051100
ISSUE DATE / TIME: 202503051100
PRODUCT CODE: 202503051100
PRODUCT CODE: 202503212359
PRODUCT CODE: 202503212359
Unit Type and Rh: 202503051100
Unit Type and Rh: 202503212359
Unit Type and Rh: 202503212359
Unit Type and Rh: 5100
Unit Type and Rh: 5100
Unit Type and Rh: 5100
Unit Type and Rh: 5100
Unit Type and Rh: 5100
Unit Type and Rh: 5100
Unit Type and Rh: 5100
Unit Type and Rh: 5100
Unit Type and Rh: 5100
Unit Type and Rh: 5100

## 2023-07-16 LAB — HEPATIC FUNCTION PANEL
ALT: 90 U/L — ABNORMAL HIGH (ref 0–44)
AST: 197 U/L — ABNORMAL HIGH (ref 15–41)
Albumin: 2.4 g/dL — ABNORMAL LOW (ref 3.5–5.0)
Alkaline Phosphatase: 91 U/L (ref 38–126)
Bilirubin, Direct: 1 mg/dL — ABNORMAL HIGH (ref 0.0–0.2)
Indirect Bilirubin: 1.2 mg/dL — ABNORMAL HIGH (ref 0.3–0.9)
Total Bilirubin: 2.2 mg/dL — ABNORMAL HIGH (ref 0.0–1.2)
Total Protein: 6.2 g/dL — ABNORMAL LOW (ref 6.5–8.1)

## 2023-07-16 LAB — BASIC METABOLIC PANEL
Anion gap: 13 (ref 5–15)
BUN: 69 mg/dL — ABNORMAL HIGH (ref 6–20)
CO2: 23 mmol/L (ref 22–32)
Calcium: 7.6 mg/dL — ABNORMAL LOW (ref 8.9–10.3)
Chloride: 102 mmol/L (ref 98–111)
Creatinine, Ser: 4.11 mg/dL — ABNORMAL HIGH (ref 0.61–1.24)
GFR, Estimated: 18 mL/min — ABNORMAL LOW (ref >60)
Glucose, Bld: 141 mg/dL — ABNORMAL HIGH (ref 70–99)
Potassium: 5.3 mmol/L — ABNORMAL HIGH (ref 3.5–5.1)
Sodium: 138 mmol/L (ref 135–145)

## 2023-07-16 LAB — POCT I-STAT, CHEM 8
BUN: 67 mg/dL — ABNORMAL HIGH (ref 6–20)
Calcium, Ion: 1.06 mmol/L — ABNORMAL LOW (ref 1.15–1.40)
Chloride: 103 mmol/L (ref 98–111)
Creatinine, Ser: 4.4 mg/dL — ABNORMAL HIGH (ref 0.61–1.24)
Glucose, Bld: 153 mg/dL — ABNORMAL HIGH (ref 70–99)
HCT: 27 % — ABNORMAL LOW (ref 39.0–52.0)
Hemoglobin: 9.2 g/dL — ABNORMAL LOW (ref 13.0–17.0)
Potassium: 4.9 mmol/L (ref 3.5–5.1)
Sodium: 139 mmol/L (ref 135–145)
TCO2: 24 mmol/L (ref 22–32)

## 2023-07-16 LAB — VANCOMYCIN, TROUGH: Vancomycin Tr: 9 ug/mL — ABNORMAL LOW (ref 15–20)

## 2023-07-16 LAB — T4, FREE: Free T4: 0.43 ng/dL — ABNORMAL LOW (ref 0.61–1.12)

## 2023-07-16 LAB — APTT
aPTT: 57 s — ABNORMAL HIGH (ref 24–36)
aPTT: >200 s (ref 24–36)
aPTT: 76 s — ABNORMAL HIGH (ref 24–36)

## 2023-07-16 LAB — MAGNESIUM: Magnesium: 2.5 mg/dL — ABNORMAL HIGH (ref 1.7–2.4)

## 2023-07-16 LAB — LACTATE DEHYDROGENASE: LDH: >2500 U/L — ABNORMAL HIGH (ref 98–192)

## 2023-07-16 LAB — PROTIME-INR
INR: 1.3 — ABNORMAL HIGH (ref 0.8–1.2)
Prothrombin Time: 16.4 s — ABNORMAL HIGH (ref 11.4–15.2)

## 2023-07-16 LAB — CG4 I-STAT (LACTIC ACID): Lactic Acid, Venous: 0.7 mmol/L (ref 0.5–1.9)

## 2023-07-16 LAB — TSH: TSH: <0.01 u[IU]/mL — ABNORMAL LOW (ref 0.350–4.500)

## 2023-07-16 LAB — FIBRINOGEN: Fibrinogen: 534 mg/dL — ABNORMAL HIGH (ref 210–475)

## 2023-07-16 SURGERY — MITRAL VALVE REPAIR
Anesthesia: General

## 2023-07-16 MED ORDER — ROCURONIUM BROMIDE 10 MG/ML (PF) SYRINGE
PREFILLED_SYRINGE | INTRAVENOUS | Status: DC | PRN
  Administered 2023-07-16: 20 mg via INTRAVENOUS
  Administered 2023-07-16 (×4): 50 mg via INTRAVENOUS

## 2023-07-16 MED ORDER — RENA-VITE PO TABS
1.0000 | ORAL_TABLET | Freq: Every day | ORAL | Status: DC
  Administered 2023-07-16 – 2023-07-24 (×8): 1 via ORAL
  Filled 2023-07-16 (×9): qty 1

## 2023-07-16 MED ORDER — POLYETHYLENE GLYCOL 3350 17 G PO PACK
17.0000 g | PACK | Freq: Every day | ORAL | Status: DC
  Administered 2023-07-17 – 2023-07-18 (×2): 17 g via ORAL
  Filled 2023-07-16 (×2): qty 1

## 2023-07-16 MED ORDER — THIAMINE HCL 100 MG/ML IJ SOLN
100.0000 mg | Freq: Every day | INTRAMUSCULAR | Status: DC
  Administered 2023-07-19: 100 mg via INTRAVENOUS
  Filled 2023-07-16: qty 2

## 2023-07-16 MED ORDER — DOCUSATE SODIUM 50 MG/5ML PO LIQD
100.0000 mg | Freq: Two times a day (BID) | ORAL | Status: DC
  Administered 2023-07-17 – 2023-07-18 (×3): 100 mg via ORAL
  Filled 2023-07-16 (×4): qty 10

## 2023-07-16 MED ORDER — INSULIN ASPART 100 UNIT/ML IJ SOLN
INTRAMUSCULAR | Status: DC | PRN
  Administered 2023-07-16: 4 [IU] via SUBCUTANEOUS

## 2023-07-16 MED ORDER — CALCIUM CHLORIDE 10 % IV SOLN
INTRAVENOUS | Status: AC
  Filled 2023-07-16: qty 10

## 2023-07-16 MED ORDER — SODIUM CHLORIDE 0.9 % IV SOLN: INTRAVENOUS | Status: DC | PRN

## 2023-07-16 MED ORDER — SODIUM CHLORIDE 0.9 % IV SOLN
0.0300 mg/kg/h | INTRAVENOUS | Status: DC
  Filled 2023-07-16: qty 250

## 2023-07-16 MED ORDER — TRANEXAMIC ACID FOR EPISTAXIS
500.0000 mg | Freq: Once | TOPICAL | Status: AC
  Administered 2023-07-17: 500 mg via TOPICAL
  Filled 2023-07-16: qty 10

## 2023-07-16 MED ORDER — HEPARIN SODIUM (PORCINE) 1000 UNIT/ML IJ SOLN
INTRAMUSCULAR | Status: DC | PRN
Start: 2023-07-16 — End: 2023-07-16
  Administered 2023-07-16: 2000 [IU] via INTRAVENOUS
  Administered 2023-07-16: 4000 [IU] via INTRAVENOUS
  Administered 2023-07-16: 10000 [IU] via INTRAVENOUS

## 2023-07-16 MED ORDER — ASPIRIN 81 MG PO CHEW
81.0000 mg | CHEWABLE_TABLET | Freq: Every day | ORAL | Status: DC
  Administered 2023-07-17 – 2023-07-22 (×6): 81 mg via ORAL
  Filled 2023-07-16 (×7): qty 1

## 2023-07-16 MED ORDER — METHIMAZOLE 10 MG PO TABS
10.0000 mg | ORAL_TABLET | Freq: Three times a day (TID) | ORAL | Status: DC
  Administered 2023-07-16 – 2023-07-23 (×21): 10 mg via ORAL
  Filled 2023-07-16 (×23): qty 1

## 2023-07-16 MED ORDER — SODIUM CHLORIDE 0.9 % IV SOLN
0.0270 mg/kg/h | INTRAVENOUS | Status: DC
  Administered 2023-07-16: 0.03 mg/kg/h via INTRAVENOUS
  Filled 2023-07-16: qty 250

## 2023-07-16 MED ORDER — PRISMASOL BGK 2/3.5 32-2-3.5 MEQ/L EC SOLN: Status: DC

## 2023-07-16 MED ORDER — NOREPINEPHRINE 16 MG/250ML-% IV SOLN
0.0000 ug/min | INTRAVENOUS | Status: DC
  Administered 2023-07-16: 2 ug/min via INTRAVENOUS
  Administered 2023-07-19: 12 ug/min via INTRAVENOUS
  Administered 2023-07-21: 2 ug/min via INTRAVENOUS
  Administered 2023-07-21: 11 ug/min via INTRAVENOUS
  Administered 2023-07-24: 2 ug/min via INTRAVENOUS
  Administered 2023-07-25: 14 ug/min via INTRAVENOUS
  Filled 2023-07-16 (×7): qty 250

## 2023-07-16 MED ORDER — ACETAMINOPHEN 325 MG PO TABS
650.0000 mg | ORAL_TABLET | Freq: Four times a day (QID) | ORAL | Status: DC | PRN
  Administered 2023-07-19 (×2): 650 mg via ORAL
  Filled 2023-07-16 (×2): qty 2

## 2023-07-16 MED ORDER — MIDAZOLAM-SODIUM CHLORIDE 100-0.9 MG/100ML-% IV SOLN
0.0000 mg/h | INTRAVENOUS | Status: DC
Start: 2023-07-16 — End: 2023-07-23
  Administered 2023-07-16: 2 mg/h via INTRAVENOUS
  Administered 2023-07-17: 4 mg/h via INTRAVENOUS
  Administered 2023-07-17: 5 mg/h via INTRAVENOUS
  Administered 2023-07-18: 10 mg/h via INTRAVENOUS
  Administered 2023-07-18: 7 mg/h via INTRAVENOUS
  Administered 2023-07-19 (×2): 10 mg/h via INTRAVENOUS
  Administered 2023-07-20: 9 mg/h via INTRAVENOUS
  Filled 2023-07-16 (×9): qty 100

## 2023-07-16 MED ORDER — CLONAZEPAM 1 MG PO TABS
1.0000 mg | ORAL_TABLET | Freq: Two times a day (BID) | ORAL | Status: DC
  Administered 2023-07-16 – 2023-07-22 (×13): 1 mg via ORAL
  Filled 2023-07-16 (×14): qty 1

## 2023-07-16 MED ORDER — ACETAMINOPHEN 650 MG RE SUPP: 650.0000 mg | Freq: Four times a day (QID) | RECTAL | Status: DC | PRN

## 2023-07-16 MED ORDER — QUETIAPINE FUMARATE 50 MG PO TABS
50.0000 mg | ORAL_TABLET | Freq: Two times a day (BID) | ORAL | Status: DC
  Administered 2023-07-16 – 2023-07-17 (×3): 50 mg via ORAL
  Filled 2023-07-16 (×4): qty 1

## 2023-07-16 MED ORDER — HEPARIN (PORCINE) IN NACL 2000-0.9 UNIT/L-% IV SOLN
INTRAVENOUS | Status: DC | PRN
  Administered 2023-07-16 (×3): 1000 mL

## 2023-07-16 MED ORDER — MILRINONE LACTATE IN DEXTROSE 20-5 MG/100ML-% IV SOLN
0.1250 ug/kg/min | INTRAVENOUS | Status: DC
  Administered 2023-07-16 – 2023-07-17 (×2): 0.125 ug/kg/min via INTRAVENOUS
  Filled 2023-07-16 (×2): qty 100

## 2023-07-16 MED ORDER — MIDAZOLAM BOLUS VIA INFUSION
0.0000 mg | INTRAVENOUS | Status: DC | PRN
  Administered 2023-07-16: 4 mg via INTRAVENOUS
  Administered 2023-07-16: 2 mg via INTRAVENOUS
  Administered 2023-07-16: 3 mg via INTRAVENOUS
  Administered 2023-07-16 (×4): 2 mg via INTRAVENOUS
  Administered 2023-07-17 (×2): 3 mg via INTRAVENOUS
  Administered 2023-07-17: 5 mg via INTRAVENOUS
  Administered 2023-07-17 (×2): 3 mg via INTRAVENOUS
  Administered 2023-07-18 – 2023-07-19 (×3): 5 mg via INTRAVENOUS
  Administered 2023-07-19: 2 mg via INTRAVENOUS
  Administered 2023-07-19 (×3): 5 mg via INTRAVENOUS

## 2023-07-16 MED ORDER — ASPIRIN 81 MG PO CHEW: 81.0000 mg | CHEWABLE_TABLET | Freq: Every day | ORAL | Status: DC

## 2023-07-16 MED ORDER — VANCOMYCIN HCL 1500 MG/300ML IV SOLN
1500.0000 mg | INTRAVENOUS | Status: DC
  Administered 2023-07-17 – 2023-07-22 (×6): 1500 mg via INTRAVENOUS
  Filled 2023-07-16 (×8): qty 300

## 2023-07-16 MED ORDER — THIAMINE MONONITRATE 100 MG PO TABS
100.0000 mg | ORAL_TABLET | Freq: Every day | ORAL | Status: DC
  Administered 2023-07-17 – 2023-07-22 (×5): 100 mg via ORAL
  Filled 2023-07-16 (×7): qty 1

## 2023-07-16 MED ORDER — OXYCODONE HCL 5 MG PO TABS
5.0000 mg | ORAL_TABLET | Freq: Four times a day (QID) | ORAL | Status: DC
  Administered 2023-07-16 – 2023-07-23 (×27): 5 mg via ORAL
  Filled 2023-07-16 (×28): qty 1

## 2023-07-16 MED ORDER — FOLIC ACID 1 MG PO TABS
1.0000 mg | ORAL_TABLET | Freq: Every day | ORAL | Status: DC
  Administered 2023-07-17 – 2023-07-22 (×6): 1 mg via ORAL
  Filled 2023-07-16 (×7): qty 1

## 2023-07-16 MED ORDER — SODIUM BICARBONATE 8.4 % IV SOLN
INTRAVENOUS | Status: AC
  Filled 2023-07-16: qty 50

## 2023-07-16 MED ORDER — ALBUMIN HUMAN 5 % IV SOLN
INTRAVENOUS | Status: AC
  Filled 2023-07-16: qty 250

## 2023-07-16 MED ORDER — EPINEPHRINE 1 MG/10ML IJ SOSY
PREFILLED_SYRINGE | INTRAMUSCULAR | Status: AC
  Filled 2023-07-16: qty 20

## 2023-07-16 SURGICAL SUPPLY — 16 items
CATH MITRA STEERABLE GUIDE (CATHETERS) IMPLANT
CLIP MITRA G4 DELIVERY SYS XTW (Clip) IMPLANT
CLOSURE PERCLOSE PROSTYLE (VASCULAR PRODUCTS) IMPLANT
KIT HEART LEFT (KITS) ×2 IMPLANT
KIT VERSACROSS LRG ACCESS (CATHETERS) IMPLANT
PACK CARDIAC CATHETERIZATION (CUSTOM PROCEDURE TRAY) ×1 IMPLANT
PAD SORBX EP SHIELD 16.5X12 (MISCELLANEOUS) IMPLANT
SHEATH DILAT COONS TAPER 22F (SHEATH) IMPLANT
SHEATH PINNACLE 8F 10CM (SHEATH) IMPLANT
SHEATH PROBE COVER 6X72 (BAG) ×1 IMPLANT
STOPCOCK MORSE 400PSI 3WAY (MISCELLANEOUS) ×6 IMPLANT
SYSTEM MITRACLIP G4 (SYSTAGENIX WOUND MANAGEMENT) IMPLANT
TRANSDUCER W/STOPCOCK (MISCELLANEOUS) ×1 IMPLANT
TUBING ART PRESS 72 MALE/FEM (TUBING) ×1 IMPLANT
WIRE EMERALD 3MM-J .035X150CM (WIRE) IMPLANT
WIRE MICRO SET SILHO 5FR 7 (SHEATH) IMPLANT

## 2023-07-16 NOTE — CV Procedure (Signed)
 ECMO NOTE:   Indication: Cardiogenic shock   Initial cannulation date: 07/10/23   ECMO type: VA ECMO with Impella CP vent   Dual lumen inflow/return cannula:   1) 25 FR multi-stage venous drainage RFV 2) 21 FR arterial return LCFA 3) 6FR L SFA distal perfusion catheter 4) Impella CP via R CFA LV vent    Daily data:   Flow 4.6L RPM 3500 Sweep  3.5L Blender at 100%   Impella at P2 1.7L flow   Labs:   ABG    Component Value Date/Time   PHART 7.444 07/16/2023 0800   PCO2ART 40.0 07/16/2023 0800   PO2ART 130 (H) 07/16/2023 0800   HCO3 27.5 07/16/2023 0800   TCO2 29 07/16/2023 0800   ACIDBASEDEF 1.0 07/11/2023 1314   O2SAT 99 07/16/2023 0800    Hgb 9.2 Platelets 92 LDH >2500 PTT 57; discussed with pharmD Lactic acid 0.9   Plan:  - Continue V-A ECMO support.  - Plan for TEER today.  - Continue pulling on CRRT.    Eduar Kumpf, DO  8:23 AM

## 2023-07-16 NOTE — Anesthesia Procedure Notes (Signed)
 Date/Time: 07/16/2023 12:05 PM  Performed by: Sudie Grumbling, CRNAPre-anesthesia Checklist: Patient identified, Emergency Drugs available, Suction available and Patient being monitored Patient Re-evaluated:Patient Re-evaluated prior to induction Oxygen Delivery Method: Circle system utilized Preoxygenation: Pre-oxygenation with 100% oxygen Induction Type: Inhalational induction Tube size: 8.0 mm Placement Confirmation: positive ETCO2, breath sounds checked- equal and bilateral and CO2 detector Secured at: 23 cm Tube secured with: Tape

## 2023-07-16 NOTE — Progress Notes (Signed)
 Patient initially tolerating CRRT pull; vaso increased to 0.04 and added levophed for borderline BP and inotrope support. BP began declining to MAPs in 50s so CRRT was kept positive for a while. ECMO with persistent low flows despite albumin/sedation boluses/repositioning.  Flows decreased to ~4L from ~4.7L. Bival reinitiated at 0.03   Sabharwal MD informed of situation. -ECMO flow goals tonight 4.0-4.5  -2x albumin -Slow CRRT removal   2330: R radial ABG with low pO2 (45), flows increased and blender turned up from 90% to 100% 0030: repeat ABG with pO2 48; flows increased to 5L and FiO2 increased from 50% > 60% 0104: pO2 52 on FiO2 60%, ECMO flows at 5L on ECMO and 2.2 L on P4 Impella. Patient not hypertensive and able to tolerate some pull.  Chest X-ray done; labs sent down. CBC resulted in 7.8 hemoglobin.  1610 Informed Sabharwal MD.  -Stop milrinone gtt -Decrease to P3 on Impella -Increase FIO2 to 70% -Transfuse 1uRBC  0250: FiO2 at 100% for R radial ABG PO2 63 0321: pO2 improved to 130 on 100%/5 PEEP/16RR; 100% on blender; post 1uRBC transfusion 0639: FiO2 turned down to 80% for pO2 of 224

## 2023-07-16 NOTE — Progress Notes (Signed)
 Patient was transported from CATH Lab to 2H22 via the ventilator with no complications.  Ventilator was plugged into a red outlet and oxygen /air hoses connected.

## 2023-07-16 NOTE — Progress Notes (Signed)
 PHARMACY - ANTICOAGULATION CONSULT NOTE  Pharmacy Consult for bivalirudin Indication:  ECMO, Impella  No Known Allergies  Patient Measurements: Height: 6\' 4"  (193 cm) Weight: 80.1 kg (176 lb 9.4 oz) IBW/kg (Calculated) : 86.8 Heparin Dosing Weight: n/a  Vital Signs: Temp: 97.2 F (36.2 C) (03/05 1900) Pulse Rate: 60 (03/05 1900)  Labs: Recent Labs    07/14/23 0327 07/14/23 0732 07/15/23 0415 07/15/23 0423 07/15/23 1602 07/15/23 1756 07/16/23 0403 07/16/23 0411 07/16/23 1341 07/16/23 1500 07/16/23 1635 07/16/23 1636 07/16/23 1644 07/16/23 1849 07/16/23 1938 07/16/23 1939  HGB 7.2*   < > 8.6*   < > 8.9*   < > 9.2*   < > 9.2*   < > 8.1*  --  8.2* 8.5* 8.8*  --   HCT 20.1*   < > 25.1*   < > 26.7*   < > 27.6*   < > 27.0*   < > 25.1*  --  24.0* 25.0* 26.0*  --   PLT 87*   < > 84*  --  84*  --  92*  --   --   --  81*  --   --   --   --   --   APTT 58*   < > 32  --  59*  --  57*  --   --   --  >200*  --   --   --   --  76*  LABPROT 17.1*  --  14.2  --   --   --  16.4*  --   --   --   --   --   --   --   --   --   INR 1.4*  --  1.1  --   --   --  1.3*  --   --   --   --   --   --   --   --   --   CREATININE 3.26*   < > 3.63*   < >  --    < > 3.61*  --  4.40*  --  4.11* 4.23*  --   --   --   --    < > = values in this interval not displayed.    Estimated Creatinine Clearance: 26.8 mL/min (A) (by C-G formula based on SCr of 4.23 mg/dL (H)).   Medical History: Past Medical History:  Diagnosis Date   Atrial fibrillation (HCC)    Hyperthyroidism    Mitral regurgitation    NSTEMI (non-ST elevated myocardial infarction) [facility])     Assessment: 38 yo M with bilateral PE s/p TNK (2/27 @1156 ) now on [place] ECMO + Impella. Pharmacy consulted for bivalirudin for anticoagulation.   S/p impella CP >5.5 3/3 - bivalirudin was previously held with oozing at insertion site but restart 3/4. aPTT was therapeutic at 57,this morning  on bivalirudin at 0.03 mg/kg/hr. Hgb 9.2, plt 92. LDH  >2,500.  Fibrinogen 534. LFTs trending downward - total bilirubin down to 2.2. Remains on CRRT - minimal fibrin in ECMO circuit.   PM > patient s/p mitraclip > s/p heparin bolus 16,000uts during procedure > initial aptt post procedure > 200 repeat aptt 76sec slight oozing at groin site  Delay restart bivalirudin for 2 hr   Goal of Therapy:  aPTT 50-70 seconds Monitor platelets by anticoagulation protocol: Yes   Plan:  Restart  bivalirudin 0.03mg /kg/hr (using dose weight 97.5 kg) - begin at 11pm aPTT q12h - F/u  CBC, LDH, fibrinogen Monitor s/s bleeding    Leota Sauers Pharm.D. CPP, BCPS Clinical Pharmacist [phone] 07/16/2023 8:29 PM   Please check AMION for all [facility] Pharmacy phone numbers After 10:00 PM, call Main Pharmacy [phone]

## 2023-07-16 NOTE — Progress Notes (Signed)
 Patient underwent successful TEER today by Dr. Excell Seltzer & Lynnette Caffey. I assisted with ECMO & impella management during this time. At the starting of the case, patient w/ Impella flows at 1.5LPM and ECMO ~4.5LPM. Impella flows were gradually increased to P8 for a total of 4 to 4.5LPM & ECMO flows were decreased to 3 LPM with the goal of decreasing mitral valve coaptation gap. He tolerated the procedure well. There was a 10-15pt improvement in MAP after placement of the 2nd and 3rd clip. Right radial ABGs were assessed throughout the procedure with PO2 remaining > 100. At the end of the case, impella was once more dropped to 1.5LPM and ECMO flows were gradually increased. TEE confirmed mild MR.   Will plan to start milrinone 0.151mcg/kg/min and target at least 5L net negative via CRRT over the next 12H. Plan for ECMO wean tomorrow AM. If he fails will transition to biventricular support with a Protek.   CC TIME: 2hrs  Belenda Alviar 5:14 PM

## 2023-07-16 NOTE — Progress Notes (Signed)
 PHARMACY - ANTICOAGULATION CONSULT NOTE  Pharmacy Consult for bivalirudin Indication:  ECMO, Impella  No Known Allergies  Patient Measurements: Height: 6\' 4"  (193 cm) Weight: 80.1 kg (176 lb 9.4 oz) IBW/kg (Calculated) : 86.8 Heparin Dosing Weight: n/a  Vital Signs: Temp: 98.1 F (36.7 C) (03/05 0700) Temp Source: Core (03/04 2000) Pulse Rate: 57 (03/05 0615)  Labs: Recent Labs    07/14/23 0327 07/14/23 0732 07/15/23 0415 07/15/23 0423 07/15/23 1600 07/15/23 1601 07/15/23 1602 07/15/23 1756 07/15/23 2119 07/15/23 2200 07/16/23 0403 07/16/23 0411  HGB 7.2*   < > 8.6*   < >  --    < > 8.9*  --    < > 8.8* 9.2* 9.5*  HCT 20.1*   < > 25.1*   < >  --    < > 26.7*  --    < > 26.0* 27.6* 28.0*  PLT 87*   < > 84*  --   --   --  84*  --   --   --  92*  --   APTT 58*   < > 32  --   --   --  59*  --   --   --  57*  --   LABPROT 17.1*  --  14.2  --   --   --   --   --   --   --  16.4*  --   INR 1.4*  --  1.1  --   --   --   --   --   --   --  1.3*  --   CREATININE 3.26*   < > 3.63*   < > 3.48*  --   --  3.61*  --   --  3.61*  --    < > = values in this interval not displayed.    Estimated Creatinine Clearance: 31.4 mL/min (A) (by C-G formula based on SCr of 3.61 mg/dL (H)).   Medical History: Past Medical History:  Diagnosis Date   Atrial fibrillation [facility])    Hyperthyroidism    Mitral regurgitation    NSTEMI (non-ST elevated myocardial infarction) [facility])     Assessment: 38 yo M with bilateral PE s/p TNK (2/27 @1156 ) now on [place] ECMO + Impella. Pharmacy consulted for bivalirudin for anticoagulation.   S/p impella CP >5.5 3/3 - was previously held with oozing at insertion site but restart 3/4. aPTT is therapeutic at 57, on bivalirudin at 0.03 mg/kg/hr. Hgb 9.2, plt 92. LDH >2,500.  Fibrinogen 534. LFTs trending downward - total bilirubin down to 2.2. Bleeding at impella site stable/improved. Remains on CRRT - minimal fibrin in ECMO circuit.   Goal of Therapy:  aPTT 50-70  seconds Monitor platelets by anticoagulation protocol: Yes   Plan:  Continue bivalirudin 0.03mg /kg/hr (using dose weight 97.5 kg) aPTT q12h -  plan to hold 2 hours prior to TEER today, will follow up bleeding after procedure for restart  F/u CBC, LDH, fibrinogen Monitor s/s bleeding   Thank you for allowing pharmacy to participate in this patient's care,  Sherron Monday, PharmD, BCCCP Clinical Pharmacist  Phone: [phone] 07/16/2023 7:41 AM  Please check AMION for all [facility] Pharmacy phone numbers After 10:00 PM, call Main Pharmacy [phone]

## 2023-07-16 NOTE — Progress Notes (Signed)
 Advanced Heart Failure Rounding Note  Cardiologist: Christell Constant, MD  Chief Complaint: V-A ECMO  Subjective:    Admitted 2/26 with worsening shortness of breath, chest pain, atrial fibrillation with RVR. Decompensated 2/27 with IVF, nodal blockade with subsequent respiratory arrest, ROSC achieved after ~16 minute down time Femoral VA ecmo cannulation 2/27 with Impella CP vent  - CVP 3, PA 28/11 on V-A ECMO flows at 4.6LPM, Impella at P2 flowing 1.6LPM.  - BP 70s/60s   Objective:   Weight Range: 80.1 kg Body mass index is 21.5 kg/m.   Vital Signs:   Temp:  [97.7 F (36.5 C)-98.6 F (37 C)] 98.1 F (36.7 C) (03/05 0800) Pulse Rate:  [33-116] 57 (03/05 0615) Resp:  [9-18] 10 (03/05 0800) SpO2:  [95 %-100 %] 100 % (03/05 0700) Arterial Line BP: (69-109)/(48-85) 79/72 (03/05 0800) FiO2 (%):  [40 %-50 %] 50 % (03/05 0417) Weight:  [80.1 kg] 80.1 kg (03/05 0500) Last BM Date : 07/16/23  Weight change: Filed Weights   07/14/23 0500 07/15/23 0500 07/16/23 0500  Weight: 86.6 kg 85.9 kg 80.1 kg    Intake/Output:   Intake/Output Summary (Last 24 hours) at 07/16/2023 0827 Last data filed at 07/16/2023 0800 Gross per 24 hour  Intake 3493.98 ml  Output 8015.4 ml  Net -4521.42 ml      Physical Exam    General: sedated/intubated Lungs: mechanical lung sounds CV: bradycardic; lines well sutured with no hematoma, erythema or bleeding. Femoral CP & ECMO cannulas sutured into place; no hematoma/bleeding; impella 5.5 axillary site without bleeding Abdomen: soft Neurologic: sedated   Telemetry   Bradycardic, 50s Labs    CBC Recent Labs    07/15/23 1602 07/15/23 2119 07/16/23 0403 07/16/23 0411 07/16/23 0800  WBC 22.4*  --  23.0*  --   --   HGB 8.9*   < > 9.2* 9.5* 9.5*  HCT 26.7*   < > 27.6* 28.0* 28.0*  MCV 81.9  --  84.1  --   --   PLT 84*  --  92*  --   --    < > = values in this interval not displayed.   Basic Metabolic Panel Recent Labs     07/15/23 0415 07/15/23 0423 07/15/23 1600 07/15/23 1601 07/15/23 1756 07/15/23 2119 07/16/23 0403 07/16/23 0411 07/16/23 0800  NA 136   < > 138   < > 137   < > 136 138 137  K 3.8   < > 4.6   < > 4.5   < > 5.3* 5.3* 5.0  CL 94*   < > 99  --  101  --  100  --   --   CO2 29  --  27  --  27  --  28  --   --   GLUCOSE 170*   < > 185*  --  214*  --  130*  --   --   BUN 56*   < > 58*  --  59*  --  63*  --   --   CREATININE 3.63*   < > 3.48*  --  3.61*  --  3.61*  --   --   CALCIUM 8.5*  --  8.1*  --  7.9*  --  8.1*  --   --   MG 2.0  --   --   --   --   --  2.5*  --   --   PHOS 2.2*  --  3.0  --   --   --  3.4  --   --    < > = values in this interval not displayed.   Liver Function Tests Recent Labs    07/15/23 0415 07/15/23 1600 07/16/23 0403  AST 538*  --  197*  ALT 138*  --  90*  ALKPHOS 73  --  91  BILITOT 4.2*  --  2.2*  PROT 5.1*  --  6.2*  ALBUMIN 2.1*  2.1* 2.2* 2.4*  2.4*   No results for input(s): "LIPASE", "AMYLASE" in the last 72 hours.  Cardiac Enzymes No results for input(s): "CKTOTAL", "CKMB", "CKMBINDEX", "TROPONINI" in the last 72 hours.   BNP: BNP (last 3 results) Recent Labs    07/09/23 1934 07/10/23 1224  BNP 703.7* 980.5*   Thyroid Function Tests Recent Labs    07/14/23 0327  TSH <0.010*     Medications:     Scheduled Medications:  sodium chloride   Intravenous Once   bisacodyl  5 mg Oral Once   chlorhexidine  1 Application Topical Once   Chlorhexidine Gluconate Cloth  6 each Topical Daily   clonazePAM  1 mg Per Tube BID   docusate  100 mg Per Tube BID   feeding supplement (PROSource TF20)  60 mL Per Tube TID   fentaNYL (SUBLIMAZE) injection  50 mcg Intravenous Once   fiber supplement (BANATROL TF)  60 mL Per Tube BID   folic acid  1 mg Per Tube Daily   hydrocortisone sod succinate (SOLU-CORTEF) inj  50 mg Intravenous Q12H   insulin aspart  0-20 Units Subcutaneous Q4H   insulin aspart  5 Units Subcutaneous Q4H   methimazole   10 mg Per Tube Q8H   metoCLOPramide (REGLAN) injection  10 mg Intravenous Q8H   multivitamin  1 tablet Per Tube QHS   mouth rinse  15 mL Mouth Rinse Q2H   oxyCODONE  5 mg Per Tube Q6H   pantoprazole (PROTONIX) IV  40 mg Intravenous QHS   polyethylene glycol  17 g Per Tube Daily   QUEtiapine  50 mg Per Tube BID   sodium bicarbonate  50 mEq Intravenous Once   sodium chloride flush  3 mL Intravenous Q12H   thiamine  100 mg Per Tube Daily   Or   thiamine  100 mg Intravenous Daily    Infusions:  albumin human Stopped (07/14/23 0301)   amiodarone 30 mg/hr (07/16/23 0800)   bivalirudin (ANGIOMAX) 250 mg in sodium chloride 0.9 % 500 mL (0.5 mg/mL) infusion 0.03 mg/kg/hr (07/16/23 0800)    ceFAZolin (ANCEF) IV     dexmedetomidine (PRECEDEX) IV infusion 1.2 mcg/kg/hr (07/16/23 0800)   feeding supplement (PIVOT 1.5 CAL) Stopped (07/16/23 0002)   HYDROmorphone 2 mg/hr (07/16/23 0800)   meropenem (MERREM) IV Stopped (07/16/23 4010)   midazolam     niCARDipine Stopped (07/15/23 0752)   PrismaSol BGK 2/3.5     prismasol BGK 4/2.5 1,500 mL/hr at 07/16/23 0639   prismasol BGK 4/2.5 340 mL/hr at 07/15/23 2339   sodium bicarbonate 25 mEq (Impella PURGE) in dextrose 5 % 1000 mL bag 5.8 mL/hr at 07/13/23 0248   vancomycin Stopped (07/15/23 1025)   vasopressin 0.03 Units/min (07/16/23 0800)    PRN Medications: acetaminophen **OR** acetaminophen, albumin human, heparin, HYDROmorphone, midazolam, mouth rinse, temazepam    Patient Profile   Patient with a history of Grave's disease, severe primary mitral regurgitation who presented with chest pain and segmental PE. Subsequent progressed to  SCAI Stage E shock requiring VA ECMO cannulation on 2/27.   Assessment/Plan   SCAI Stage E Cardiogenic shock - Suspect hypoxic respiratory failure driven with segmental PE on top of severe MR, nodal blockage, and thyrotoxicosis - Down time ~ 20 minutes, good mental status confirmed post code - Lactic acid  resolved; repeat this AM pending.  - Vanc/meropenem for antibiotic prophylaxis, suspect leukocytosis is reactive and in the setting of steroids; WBC ct now plateau at 32K.  - Impella 5.5 placed yesterday; bleeding from axillary pocket has now resolved. Restarting bivalirudin - We performed a mini-turn down on 07/16/23 at bedside; ECMO flows were reduced in a stepwise pattern by 0.5LPM to a goal of 2.5LPM. During this time, impella 5.5 flows increased to maintain net even flows. With reduction of ECMO flows, patient had onset of frequent PVCs/NSVT requiring amio boluses. In addition, despite increased LV unloading patient continued to have severe 4+ MR with progressive RV failure on bedside TTE.  - 8.3L pulled on CRRT yesterday, negative 4.7L q24h with no chugging or changes in pVenous. CVP remains low at 3. Will continue to pull aggressively through today.  - Plan on TEER today on V-A ECMO support. If patient cannot be weaned off fem-fem V-A ECMO support in the next 24h, plan on transition to BiV support with protek + impella 5.5.   Severe mitral valve regurgitation - Noted on previous echocardiogram - Suspect contributing to ongoing shock - ECPella for management currently - TCTS consult for consideration of mitral valve repair - Despite weaning down V-A ECMO flow with reduction in MAP to 55-60 & unloading with impella 5.5 @ P8, patient continued to have severe 4+ MR by bedside TTE.  - TEER today; discussed extensively with our structural team.   Anemia - Multiple blood transfusions yesterday - Suspect component of hemolysis given bilirubin, LDH, slowly trending downwards - CK and lactic acid reassuring - stable  Acute renal failure/hyperkalemia - Appreciate nephrology consult - CRRT set up through ECMO circuit - see above  Thyrotoxicosis - Was not taking medications as they made him feel poorly - Suspect underlying low output heart failure due to mitral valve disease - Methimazole  decreased to 10mg  TID - TSH below detectable limit; T4 0.54 07/14/23,  - Repeat TSH/T4 pending; will wean off steroids in the next 24-48h.  - Will need definitive outpatient treatment  Atrial fibrillation - Present on arrival, in the setting of PE and thyroid disease - D/C amiodarone; sinus bradycardia today.  - Bivalrudin for anticoagulation  Pulmonary embolism:  - Segmental without right heart strain - Bivalrudin as above  CAD - Prior embolic infarct in the LAD territory with corresponding LGE on CMR - Lifelong anticoagulation  Sedation - Fentanyl, precedex - Versed as needed - Spot EEG without evidence of seizure - Notably minimal alcohol use after discussion with fiance.   Medication concerns reviewed with patient and pharmacy team. Barriers identified: antibiotic dosing, CRRT  Length of Stay: 7  Aleesha Ringstad, DO  07/16/2023, 8:27 AM  Advanced Heart Failure Team Pager [phone] (M-F; 7a - 5p)  Please contact [facility] for night-coverage after hours (5p -7a ) and weekends on [email or web address]  CRITICAL CARE Performed by: Dorthula Nettles   Total critical care time: 60 minutes  Critical care time was exclusive of separately billable procedures and treating other patients.  Critical care was necessary to treat or prevent imminent or life-threatening deterioration.  Critical care was time spent personally by me on the following activities:  development of treatment plan with patient and/or surrogate as well as nursing, discussions with consultants, evaluation of patient's response to treatment, examination of patient, obtaining history from patient or surrogate, ordering and performing treatments and interventions, ordering and review of laboratory studies, ordering and review of radiographic studies, pulse oximetry and re-evaluation of patient's condition.

## 2023-07-16 NOTE — Progress Notes (Signed)
 NAME:  Aaron [name], MRN:  [id number], DOB:  [birth year], LOS: 7 ADMISSION DATE:  07/09/2023, CONSULTATION DATE:  07/11/2023 REFERRING MD: Clearnce Hasten, CHIEF COMPLAINT: Status post cardiac arrest  History of Present Illness:  38 year old male with hypothyroidism, paroxysmal A-fib, severe mitral regurgitation who presented to [facility] long hospital with chest pain and shortness of breath for few days associated cough, nausea and diarrhea.  In the emergency department he was diagnosed with PE, was started on IV heparin.  He takes beta-blocker, steroid and methimazole for hyperthyroidism.  On 2/27 patient rapidly deteriorated developed V. tach/V-fib cardiac arrest patient was intubated after 40 minutes of CPR, cardiology was consulted patient was placed on VA ECMO and was transferred to [facility]  Pertinent  Medical History   Past Medical History:  Diagnosis Date   Atrial fibrillation [facility])    Hyperthyroidism    Mitral regurgitation    NSTEMI (non-ST elevated myocardial infarction) (HCC)      Significant Hospital Events: Including procedures, antibiotic start and stop dates in addition to other pertinent events     Interim History / Subjective:  Underwent Impella 5.5 placement yesterday, CP Impella was discontinued  Started with mixing cloud due to hypertension ECMO flow was increased to 4.2 L He is on Impella at P3 with 1.7 L flow Required another PRBC, hemoglobin 8.1 now On Cardene infusion at 5 mg   Objective   Blood pressure (!) 86/73, pulse (!) 57, temperature 98.1 F (36.7 C), resp. rate 10, height 6\' 4"  (1.93 m), weight 80.1 kg, SpO2 100%. PAP: (22-53)/(10-26) 29/14 CVP:  [1 mmHg-12 mmHg] 4 mmHg  Vent Mode: PRVC FiO2 (%):  [40 %-50 %] 50 % Set Rate:  [10 bmp] 10 bmp Vt Set:  [580 mL] 580 mL PEEP:  [5 cmH20] 5 cmH20 Plateau Pressure:  [22 cmH20-26 cmH20] 25 cmH20   Intake/Output Summary (Last 24 hours) at 07/16/2023 0708 Last data filed at 07/16/2023 0700 Gross  per 24 hour  Intake 3611.98 ml  Output 8350.4 ml  Net -4738.42 ml   Filed Weights   07/14/23 0500 07/15/23 0500 07/16/23 0500  Weight: 86.6 kg 85.9 kg 80.1 kg    Examination: General: Crtitically ill-appearing young male, orally intubated HEENT: [place]/AT, eyes anicteric.  ETT and OGT in place Neuro: Sedated, not following commands.  Eyes are closed.  Pupils 3 mm bilateral reactive to light Chest: Impella 5 5 in place on left side of chest, coarse breath sounds, no wheezes or rhonchi Heart: Regular rate and rhythm, no murmurs or gallops Abdomen: Soft, nondistended, bowel sounds present Skin: Multiple tattoo marks noted on chest and bilateral upper extremities Extremities: ECMO cannula noted in groin   Labs and images reviewed  Resolved Hospital Problem list   Lactic acidosis, resolved Refractory hyperkalemia, improving  Assessment & Plan:  Status post in-hospital V. tach followed by PEA cardiac arrest Paroxysmal A-fib RVR Severe mitral regurgitation Acute biventricular HFrEF with cardiogenic shock on VA ECMO Acute pulmonary emboli status post TNK Acute respiratory failure with hypoxia Hyperthyroidism Acute kidney injury due to ischemic ATN on CRRT Acute respiratory failure with hypoxia Bilateral multifocal pneumonia Hyperphosphatemia/hypocalcemia Shock liver Acute on chronic anemia due to critical illness and hemolysis Thrombocytopenia due to critical illness  On VA ECMO at 4.2 L flow, tolerating well, no issues CP Impella was switched to 5.5, currently at P3 with 1.7 L flow Will try to give him ECMO weaning trial today Remain in sinus rhythm Closely monitor and supplement electrolytes Advanced  heart failure team is following, patient will eventually need mitral valve surgery once clinically stable On CRRT with plan to keep net -150-200 cc/h CVP is at 3, filling pressures are better today Nephrology is following Monitor PTT, continue bival Will stop citrate via  CRRT Continue lung protective ventilation, FiO2 was increased to 50% yesterday Now PaO2 is improved, will decrease FiO2 to 40% VAP prevention bundle in place Will switch fentanyl infusion to Dilaudid Continue Precedex with RASS goal -2 Continue as needed Versed Continue methimazole 10 mg 3 times daily Decrease stress dose steroid to 50 mg 3 times daily Monitor H&H and transfuse if less than 8 Patient respiratory culture is growing staph aureus and staph epi, sensitivities are pending Continue antibiotics with vancomycin and meropenem LDH remain elevated with elevated indirect bilirubin which is suggestive of hemolysis Monitor platelet count, currently in 80s  Best Practice (right click and "Reselect all SmartList Selections" daily)   Diet/type: NPO TF DVT prophylaxis systemic bival Pressure ulcer(s): N/A GI prophylaxis: PPI Lines: Central line, Arterial Line, and yes and it is still needed.  ECMO cannula in place Foley:  Yes, and it is still needed Code Status:  full code Last date of multidisciplinary goals of care discussion [Per primary team]  Labs   CBC: Recent Labs  Lab 07/14/23 1603 07/14/23 1609 07/14/23 2100 07/14/23 2355 07/15/23 0415 07/15/23 0423 07/15/23 1602 07/15/23 2119 07/15/23 2200 07/16/23 0403 07/16/23 0411  WBC 27.4*  --  24.0*  --  23.8*  --  22.4*  --   --  23.0*  --   HGB 7.9*   < > 8.4*   < > 8.6*   < > 8.9* 9.2* 8.8* 9.2* 9.5*  HCT 22.0*   < > 23.8*   < > 25.1*   < > 26.7* 27.0* 26.0* 27.6* 28.0*  MCV 79.1*  --  79.3*  --  79.4*  --  81.9  --   --  84.1  --   PLT 78*  --  82*  --  84*  --  84*  --   --  92*  --    < > = values in this interval not displayed.    Basic Metabolic Panel: Recent Labs  Lab 07/12/23 1600 07/12/23 1604 07/13/23 0403 07/13/23 0409 07/14/23 0327 07/14/23 0732 07/14/23 1603 07/14/23 1609 07/14/23 2100 07/14/23 2355 07/15/23 0415 07/15/23 0423 07/15/23 0428 07/15/23 0605 07/15/23 1600 07/15/23 1601  07/15/23 1756 07/15/23 2119 07/15/23 2200 07/16/23 0403 07/16/23 0411  NA 137   < > 138   < > 137   < > 142   < > 138   < > 136   < > 140   < > 138   < > 137 138 140 136 138  K 4.4   < > 3.9   < > 4.0   < > 3.8   < > 3.1*   < > 3.8   < > 3.7   < > 4.6   < > 4.5 4.3 4.6 5.3* 5.3*  CL 97*   < > 93*   < > 92*   < > 95*  --  94*  --  94*  --  91*  --  99  --  101  --   --  100  --   CO2 28  --  32   < > 34*  --  35*  --  33*  --  29  --   --   --  27  --  27  --   --  28  --   GLUCOSE 204*   < > 223*   < > 219*   < > 165*  --  181*  --  170*  --  245*  --  185*  --  214*  --   --  130*  --   BUN 35*   < > 36*   < > 46*   < > 55*  --  57*  --  56*  --  40*  --  58*  --  59*  --   --  63*  --   CREATININE 3.01*   < > 3.09*   < > 3.26*   < > 3.65*  --  3.62*  --  3.63*  --  2.40*  --  3.48*  --  3.61*  --   --  3.61*  --   CALCIUM 8.9  --  9.5   < > 8.9  --  8.5*  --  8.5*  --  8.5*  --   --   --  8.1*  --  7.9*  --   --  8.1*  --   MG 2.2  --  2.4  --  2.2  --   --   --   --   --  2.0  --   --   --   --   --   --   --   --  2.5*  --   PHOS 4.5  --  2.9  --  2.5  --  3.2  --   --   --  2.2*  --   --   --  3.0  --   --   --   --  3.4  --    < > = values in this interval not displayed.   GFR: Estimated Creatinine Clearance: 31.4 mL/min (A) (by C-G formula based on SCr of 3.61 mg/dL (H)). Recent Labs  Lab 07/13/23 0413 07/13/23 1607 07/14/23 0823 07/14/23 1603 07/14/23 2100 07/15/23 0415 07/15/23 0424 07/15/23 1602 07/16/23 0403 07/16/23 0417  WBC  --    < >  --    < > 24.0* 23.8*  --  22.4* 23.0*  --   LATICACIDVEN 1.3  --  0.9  --   --   --  0.9  --   --  0.7   < > = values in this interval not displayed.    Liver Function Tests: Recent Labs  Lab 07/12/23 1600 07/13/23 0403 07/14/23 0327 07/14/23 1603 07/15/23 0415 07/15/23 1600 07/16/23 0403  AST 1,108* 970* 767*  --  538*  --  197*  ALT 382* 355* 224*  --  138*  --  90*  ALKPHOS 78 74 68  --  73  --  91  BILITOT 7.4* 7.3*  7.9*  --  4.2*  --  2.2*  PROT 4.4* 4.7* NOT CALCULATED  --  5.1*  --  6.2*  ALBUMIN 1.9*  1.9* 2.0* 2.2* 1.9* 2.1*  2.1* 2.2* 2.4*  2.4*   Recent Labs  Lab 07/09/23 1934  LIPASE 23   No results for input(s): "AMMONIA" in the last 168 hours.  ABG    Component Value Date/Time   PHART 7.413 07/16/2023 0411   PCO2ART 45.0 07/16/2023 0411   PO2ART 112 (H) 07/16/2023 0411   HCO3 28.8 (H) 07/16/2023 0411   TCO2 30 07/16/2023 0411   ACIDBASEDEF  1.0 07/11/2023 1314   O2SAT 98 07/16/2023 0411     Coagulation Profile: Recent Labs  Lab 07/12/23 0427 07/13/23 0403 07/14/23 0327 07/15/23 0415 07/16/23 0403  INR 2.6* 1.5* 1.4* 1.1 1.3*    Cardiac Enzymes: Recent Labs  Lab 07/11/23 0512 07/11/23 1854  CKTOTAL 117 357    HbA1C: Hgb A1c MFr Bld  Date/Time Value Ref Range Status  07/10/2023 06:44 AM 4.8 4.8 - 5.6 % Final    Comment:    (NOTE) Pre diabetes:          5.7%-6.4%  Diabetes:              >6.4%  Glycemic control for   <7.0% adults with diabetes   10/20/2021 01:07 AM 4.2 (L) 4.8 - 5.6 % Final    Comment:    (NOTE) Pre diabetes:          5.7%-6.4%  Diabetes:              >6.4%  Glycemic control for   <7.0% adults with diabetes     CBG: Recent Labs  Lab 07/15/23 1158 07/15/23 1558 07/15/23 2116 07/16/23 0006 07/16/23 0409  GLUCAP 125* 166* 149* 149* 131*    Critical care time:     The patient is critically ill due to cardiogenic shock status post VA ECMO.  Critical care was necessary to treat or prevent imminent or life-threatening deterioration.  Critical care was time spent personally by me on the following activities: development of treatment plan with patient and/or surrogate as well as nursing, discussions with consultants, evaluation of patient's response to treatment, examination of patient, obtaining history from patient or surrogate, ordering and performing treatments and interventions, ordering and review of laboratory studies,  ordering and review of radiographic studies, pulse oximetry, re-evaluation of patient's condition and participation in multidisciplinary rounds.   During this encounter critical care time was devoted to patient care services described in this note for 44 minutes.     Cheri Fowler, MD [facility] See Amion for pager If no response to pager, please call [phone] until 7pm After 7pm, Please call E-link [phone]

## 2023-07-16 NOTE — Op Note (Signed)
 [facility]   MULTIDISCIPLINARY HEART TEAM  Date of Procedure:  07/16/2023  Preoperative Diagnosis: Severe Symptomatic Mitral Regurgitation (Stage D)  Postoperative Diagnosis: Same   Procedure Performed: Ultrasound-guided right transfemoral venous access Double PreClose right femoral vein Transseptal puncture using Bailess RF needle Mitral valve repair with MitraClip XTW x 3  Surgeon: Micheline Chapman, MD  Co-Surgeon: Alverda Skeans, MD  Echocardiographer: Riley Lam, MD  Anesthesiologist: Hester Mates, DO  Device Implant: Mitraclip XTW x 3  Procedural Indication: Severe Non-rheumatic Mitral Regurgitation (Stage D)   Brief History: 38 yo male with severe cardiogenic shock on ECMO and an Impella 5.5. 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 He presents today for TEER as a bridge to more definitive therapy as he has been unable to wean off support. His case has been reviewed extensively by the advanced HF team and multidisciplinary heart valve team, with all in agreement that this is the patient's best treatment option.   Echo Findings: Preop:  Severe LV systolic dysfunction Severe MR secondary to severe bileaflet prolapse, Grade 4+ Post-op:  Unchanged LV systolic function Mild residual MR  Procedural Details: Prep The patient is brought to the cardiac catheterization lab in the fasting state. General anesthesia is induced. The patient is prepped from the groin to chin. A foley catheter is placed. Hemodynamics are monitored via a radial artery line.   Venous Access Using ultrasound guidance, the left femoral vein is punctured. Ultrasound images are captured and stored in the patient's chart. The vein is dilated and 2 Perclose devices are deplyed at 10' and 2' positions to 'Preclose' the femoral vein. An 8 Fr sheath is inserted.  Transseptal Puncture A [place] Versacross  wire is advanced into the SVC A [place]lis transseptal dilator is advanced into the SVC, and the VersaCross RF wire is retracted into the dilator  The transseptal sheath is retracted into the RA under fluoroscopic and echo guidance to obtain position on the posterior fossa where echo measurements are made to assure appropriate access to the mitral valve. Once proper position is confirmed by echo, RF energy is delivered and the VersaCross wire is advanced into the LA without resistance. The dilator and sheath are advanced over the wire where proper position is confirmed by echo and pressure measurement Weight based IV heparin is administered and a therapeutic ACT > 250 is confirmed  Steerable Guide Catheter Insertion The VersaCross wire is positioned at the left upper pulmonary vein The femoral vein is progressively dilated and the 24 Fr Steerable guide catheter is inserted and then directed across the interatrial septum over the wire. Position is confirmed approximately 3 cm into the left atrium The guide is de-aired   MitraClip Insertion The MitraClip XTW is prepped per protocol and inserted via the introducer into the steerable guide catheter The Clip Delivery System (CDS) is advanced under fluoro and echo guidance so that the sleeve markers are evenly spaced on each side of the guide marker  MitraClip Positioning in the Left Atrium (Supravalvular Alignment) M-knob is applied to bring the Clip towards the mitral valve. Echo guidance is used to avoid contact with LA structures. The Clip arms are opened to 180 degrees 2D and 3D TEE imaging is performed in multiple planes and the Clip is positioned and aligned above the valve using standard steering techniques   Entry into the Left Ventricle and Mitral Valve Leaflet Grasp The Clip is  advanced across the mitral valve into the LV, maintaining proper orientation The Clip arms are opened to 120 degrees and the Clip is slowly retracted  Capture of both  the anterior and posterior leaflets are visualized by echo and the grippers are dropped  MitraClip Deployment After extensive echo evaluation, reduction in mitral regurgitation is felt to be adequate, with moderate residual MR lateral to the first Clip Following standard protocol, the lock line is removed after testing the lock mechanism. The lock is rechecked and is shown to be intact. The MitraClip device is deployed and the clip delivery system is removed under echo guidance with caution taken to avoid contact with LA structures  Placement of Clip #2 MitraClip Insertion The MitraClip XTW is prepped per protocol and inserted via the introducer into the steerable guide catheter The Clip Delivery System (CDS) is advanced under fluoro and echo guidance so that the sleeve markers are evenly spaced on each side of the guide marker  MitraClip Positioning in the Left Atrium (Supravalvular Alignment) M-knob is applied to bring the Clip towards the mitral valve. Echo guidance is used to avoid contact with LA structures. Low tidal volume respiration is initiated The Clip arms are opened to 180 degrees 2D and 3D TEE imaging is performed in multiple planes and the Clip is positioned and aligned above the valve using standard steering techniques   Entry into the Left Ventricle and Mitral Valve Leaflet Grasp The Clip is advanced across the mitral valve into the LV, maintaining proper orientation and with caution taken to avoid contact with the first Clip, with this Clip position in the center of A2/P2 lateral to the first Clip The Clip arms are opened further and the Clip is slowly retracted  Capture of both the anterior and posterior leaflets are visualized by echo and the grippers are dropped  MitraClip Deployment After extensive echo evaluation, reduction in mitral regurgitation is felt to be adequate Following standard protocol, the MitraClip device is deployed, gripper line and lock line are  removed  Placement of Clip #3: MitraClip Insertion The MitraClip XTW is prepped per protocol and inserted via the introducer into the steerable guide catheter The Clip Delivery System (CDS) is advanced under fluoro and echo guidance so that the sleeve markers are evenly spaced on each side of the guide marker  MitraClip Positioning in the Left Atrium (Supravalvular Alignment) M-knob is applied to bring the Clip towards the mitral valve. Echo guidance is used to avoid contact with LA structures. Low tidal volume respiration is initiated The Clip arms are opened to 180 degrees 2D and 3D TEE imaging is performed in multiple planes and the Clip is positioned and aligned above the valve using standard steering techniques   Entry into the Left Ventricle and Mitral Valve Leaflet Grasp The Clip is advanced across the mitral valve into the LV, maintaining proper orientation and with caution taken to avoid contact with the second Clip, with this Clip position lateral A2/P2, lateral to the second Clip The Clip arms are opened further and the Clip is slowly retracted  Capture of both the anterior and posterior leaflets are visualized by echo and the grippers are dropped  Device Removal The clip delivery system is removed under echo guidance The steerable guide catheter is retracted into the right atrium and the interatrial septum is assessed by echo without evidence of right-to-left shunting or large ASD  Hemostasis The guide catheter is removed over a 0.035" wire and the Perclose sutures are tightened  with complete hemostasis and no evidence of hematoma  Estimated blood loss: minimal  There are no immediate procedural complications. The patient is transferred to the post-procedure recovery area in stable condition.   Tonny Bollman 07/16/2023 4:39 PM

## 2023-07-16 NOTE — Progress Notes (Signed)
 Pt transported to cathlab on vent without complication. CRNA placed pt on their vent on arrival.

## 2023-07-16 NOTE — Transfer of Care (Signed)
 Immediate Anesthesia Transfer of Care Note  Patient: Aaron [name]  Procedure(s) Performed: TRANSCATHETER MITRAL EDGE TO EDGE REPAIR TRANSESOPHAGEAL ECHOCARDIOGRAM  Patient Location: SICU  Anesthesia Type:General  Level of Consciousness: sedated and Patient remains intubated per anesthesia plan  Airway & Oxygen Therapy: Patient remains intubated per anesthesia plan and Patient placed on Ventilator (see vital sign flow sheet for setting)  Post-op Assessment: Report given to RN and Post -op Vital signs reviewed and stable  Post vital signs: Reviewed and stable  Last Vitals:  Vitals Value Taken Time  BP    Temp 36.6 C 07/16/23 1540  Pulse    Resp 10 07/16/23 1540  SpO2    Vitals shown include unfiled device data.  Last Pain:  Vitals:   07/15/23 2000  TempSrc: Core  PainSc:          Complications: There were no known notable events for this encounter.

## 2023-07-16 NOTE — Op Note (Signed)
 PROCEDURE:  Transcatheter edge to edge mitral valve repair (TEER) INDICATION: Severe symptomatic mitral regurgitation (Stage D)  SURGEON:  Tonny Bollman, MD CO-SURGEON:  Alverda Skeans, MD  PROCEDURAL DETAILS: General anesthesia is induced.  The patient is prepped and draped.  Baseline transesophageal echo images are obtained and confirm appropriate anatomy for transcatheter edge-to-edge mitral valve repair.  Using vascular ultrasound guidance, the right common femoral vein is accessed via a front wall puncture.  2 Perclose sutures are deployed and an 8 [place] sheath is inserted.  A versa cross wire is advanced into the SVC.  Heparin is administered and a therapeutic ACT is achieved.  Transseptal puncture is performed over the mid posterior portion of the fossa.  The septum is dilated while the MitraClip steerable guide catheter is prepped.  After progressively dilating the right common femoral vein, the 24 French MitraClip steerable guide catheter is inserted and advanced across the interatrial septum.  The dilator and the versa cross wire were carefully removed and the guide tip is appropriately positioned approximately 3 cm across the interatrial septum.  A MitraClip XTW device is prepped per protocol.  The device is inserted through the steerable guide catheter with caution taken to avoid air entrapment.  The MitraClip device is positioned above the mitral valve after applying appropriate curve to the guide catheter.  The MitraClip device is then oriented coaxially with the anterior and posterior leaflets of the mitral valve.  Low tidal volume ventilation is initiated.  The clip device is advanced across the mitral valve and pulled back until capture of both the anterior and posterior leaflets is achieved.  Grippers are dropped and the clip is closed under TEE guidance.  Careful TEE assessment is performed and reduction in mitral valve regurgitation is felt to be appropriate.  Leaflet insertion is  verified using 3D imaging.  The MitraClip device is deployed using normal technique and the clip delivery system is removed.  After full TEE assessment, the procedural result is felt to be adequate with reduction of mitral regurgitation to 3+.   Second clip   A MitraClip XTW device is prepped per protocol.  The device is inserted through the steerable guide catheter with caution taken to avoid air entrapment.  The MitraClip device is positioned above the mitral valve after applying appropriate curve to the guide catheter.  The MitraClip device is then oriented coaxially with the anterior and posterior leaflets of the mitral valve.  Low tidal volume ventilation is initiated.  The clip device is advanced across the mitral valve and pulled back until capture of both the anterior and posterior leaflets is achieved.  Grippers are dropped and the clip is closed under TEE guidance.  Careful TEE assessment is performed and reduction in mitral valve regurgitation is felt to be appropriate.  Leaflet insertion is verified using 3D imaging.  The MitraClip device is deployed using normal technique and the clip delivery system is removed.  After full TEE assessment, the procedural result is felt to be adequate with reduction of mitral regurgitation to 2+.   Third clip   A MitraClip XTW device is prepped per protocol.  The device is inserted through the steerable guide catheter with caution taken to avoid air entrapment.  The MitraClip device is positioned above the mitral valve after applying appropriate curve to the guide catheter.  The MitraClip device is then oriented coaxially with the anterior and posterior leaflets of the mitral valve.  Low tidal volume ventilation is initiated.  The  clip device is advanced across the mitral valve and pulled back until capture of both the anterior and posterior leaflets is achieved.  Grippers are dropped and the clip is closed under TEE guidance.  Careful TEE assessment is performed  and reduction in mitral valve regurgitation is felt to be appropriate.  Leaflet insertion is verified using 3D imaging.  The MitraClip device is deployed using normal technique and the clip delivery system is removed.  After full TEE assessment, the procedural result is felt to be adequate with reduction of mitral regurgitation to trivial.   PROCEDURE COMPLETION: The steerable guide catheter is pulled back into the right atrium and the interatrial septum is assessed with TEE.  There is no significant septal injury seen and no right to left shunting.  The guide catheter is removed and the Perclose sutures are tightened.  Protamine is administered.  CONCLUSION: Successful transcatheter edge-to-edge mitral valve repair under fluoroscopic and echo guidance, reducing baseline 4+ mitral regurgitation to trivial, 3 clips positioned A2/P2.  Orbie Pyo 07/16/2023 3:33 PM

## 2023-07-16 NOTE — Anesthesia Postprocedure Evaluation (Signed)
 Anesthesia Post Note  Patient: Aaron [name]  Procedure(s) Performed: TRANSCATHETER MITRAL EDGE TO EDGE REPAIR TRANSESOPHAGEAL ECHOCARDIOGRAM     Patient location during evaluation: ICU Anesthesia Type: General Level of consciousness: patient remains intubated per anesthesia plan and sedated Pain management: pain level controlled Vital Signs Assessment: post-procedure vital signs reviewed and stable Respiratory status: patient on ventilator - see flowsheet for VS and patient remains intubated per anesthesia plan Cardiovascular status: blood pressure returned to baseline and stable Postop Assessment: no apparent nausea or vomiting Anesthetic complications: no   There were no known notable events for this encounter.  Last Vitals:  Vitals:   07/16/23 1509 07/16/23 1514  BP:    Pulse: 69 66  Resp: 20 20  Temp:    SpO2:      Last Pain:  Vitals:   07/15/23 2000  TempSrc: Core  PainSc:                  Mariann Barter

## 2023-07-16 NOTE — Procedures (Signed)
 Admit: 07/09/2023 LOS: 7  82M AKI likely ATN after SCA, cardiogenic shock on ECMO and Impella, AHRF/VDRF  Current CRRT Prescription: Start Date: 07/11/23 Catheter: Using ECMO circuit BFR: 115 Pre Blood Pump: 4K DFR: 1500 4K Replacement Rate: 400 4K Goal UF: -12mL/h Anticoagulation: bivalrudin Clotting: none  S: Going for mitral valve transcutaneous repair today Remains on ECMO K 5.2 on 4K bath,  P 3.4 No clotting on angiomax Fiance at bedside, updated  O: 03/04 0701 - 03/05 0700 In: 3612 [I.V.:1685.1; NG/GT:1182; IV Piggyback:500.1] Out: 8350.4 [Urine:10; Stool:770]  Filed Weights   07/14/23 0500 07/15/23 0500 07/16/23 0500  Weight: 86.6 kg 85.9 kg 80.1 kg    Recent Labs  Lab 07/15/23 0415 07/15/23 0423 07/15/23 1600 07/15/23 1601 07/15/23 1756 07/15/23 2119 07/16/23 0403 07/16/23 0411 07/16/23 0800  NA 136   < > 138   < > 137   < > 136 138 137  K 3.8   < > 4.6   < > 4.5   < > 5.3* 5.3* 5.0  CL 94*   < > 99  --  101  --  100  --   --   CO2 29  --  27  --  27  --  28  --   --   GLUCOSE 170*   < > 185*  --  214*  --  130*  --   --   BUN 56*   < > 58*  --  59*  --  63*  --   --   CREATININE 3.63*   < > 3.48*  --  3.61*  --  3.61*  --   --   CALCIUM 8.5*  --  8.1*  --  7.9*  --  8.1*  --   --   PHOS 2.2*  --  3.0  --   --   --  3.4  --   --    < > = values in this interval not displayed.   Recent Labs  Lab 07/15/23 0415 07/15/23 0423 07/15/23 1602 07/15/23 2119 07/16/23 0403 07/16/23 0411 07/16/23 0800  WBC 23.8*  --  22.4*  --  23.0*  --   --   HGB 8.6*   < > 8.9*   < > 9.2* 9.5* 9.5*  HCT 25.1*   < > 26.7*   < > 27.6* 28.0* 28.0*  MCV 79.4*  --  81.9  --  84.1  --   --   PLT 84*  --  84*  --  92*  --   --    < > = values in this interval not displayed.    Scheduled Meds:  sodium chloride   Intravenous Once   bisacodyl  5 mg Oral Once   Chlorhexidine Gluconate Cloth  6 each Topical Daily   clonazePAM  1 mg Per Tube BID   docusate  100 mg Per  Tube BID   feeding supplement (PROSource TF20)  60 mL Per Tube TID   fentaNYL (SUBLIMAZE) injection  50 mcg Intravenous Once   fiber supplement (BANATROL TF)  60 mL Per Tube BID   folic acid  1 mg Per Tube Daily   hydrocortisone sod succinate (SOLU-CORTEF) inj  50 mg Intravenous Q12H   insulin aspart  0-20 Units Subcutaneous Q4H   insulin aspart  5 Units Subcutaneous Q4H   methimazole  10 mg Per Tube Q8H   metoCLOPramide (REGLAN) injection  10 mg Intravenous Q8H   multivitamin  1 tablet Per Tube QHS   mouth rinse  15 mL Mouth Rinse Q2H   oxyCODONE  5 mg Per Tube Q6H   pantoprazole (PROTONIX) IV  40 mg Intravenous QHS   polyethylene glycol  17 g Per Tube Daily   QUEtiapine  50 mg Per Tube BID   sodium bicarbonate  50 mEq Intravenous Once   sodium chloride flush  3 mL Intravenous Q12H   thiamine  100 mg Per Tube Daily   Or   thiamine  100 mg Intravenous Daily   Continuous Infusions:  albumin human Stopped (07/14/23 0301)   bivalirudin (ANGIOMAX) 250 mg in sodium chloride 0.9 % 500 mL (0.5 mg/mL) infusion 0.03 mg/kg/hr (07/16/23 1000)   dexmedetomidine (PRECEDEX) IV infusion 1.2 mcg/kg/hr (07/16/23 1000)   feeding supplement (PIVOT 1.5 CAL) Stopped (07/16/23 0002)   HYDROmorphone 2 mg/hr (07/16/23 1000)   meropenem (MERREM) IV Stopped (07/16/23 4098)   midazolam 2 mg/hr (07/16/23 1000)   niCARDipine Stopped (07/15/23 0752)   PrismaSol BGK 2/3.5 500 mL/hr at 07/16/23 0949   prismasol BGK 4/2.5 1,500 mL/hr at 07/16/23 0954   prismasol BGK 4/2.5 340 mL/hr at 07/15/23 2339   sodium bicarbonate 25 mEq (Impella PURGE) in dextrose 5 % 1000 mL bag 5.8 mL/hr at 07/13/23 0248   vancomycin 200 mL/hr at 07/16/23 1000   vasopressin 0.03 Units/min (07/16/23 1000)   PRN Meds:.acetaminophen **OR** acetaminophen, albumin human, heparin, HYDROmorphone, midazolam, mouth rinse, temazepam  ABG    Component Value Date/Time   PHART 7.444 07/16/2023 0800   PCO2ART 40.0 07/16/2023 0800   PO2ART 130  (H) 07/16/2023 0800   HCO3 27.5 07/16/2023 0800   TCO2 29 07/16/2023 0800   ACIDBASEDEF 1.0 07/11/2023 1314   O2SAT 99 07/16/2023 0800   Inbutaed, sedated Cont hum  Coarse bs Sedated Ill appearing  A/P  Dialysis dependent anuric AKI 2/2 ATN from #2 and #3 on CRRT Cardiogenic Shock on ECMO + Impella VT + PEA SCA  PE on angiomax per CCM Anemia, Hb stable, per AHF Hyperkalemia, up a tad today Severe MR for TEER today  With K creepng up move to 2K post bath,  Cont other settings at this time.   D/w clinical pharmacy, and ICU RN.    Sabra Heck, MD [facility]

## 2023-07-16 NOTE — Progress Notes (Addendum)
 Pharmacy Antibiotic Note  Aaron [name] is a 38 y.o. male admitted on 07/09/2023 with surgical prophylaxis s/p ECMO cannulation 2/28. Starting CRRT. Pharmacy has been consulted for vancomcyin dosing.  Currently on day #6 of meropenem/vancomycin - WBC down to 23 (on steroids), LA is 0.7, Scr 3.6 (on CRRT). Trach aspirate 2/28 showing rare staph aureus (sensitivities pan-sensitive MSSA). Vancomycin trough this morning came back at 9.   Plan: Increase vancomycin to 1500 mg IV q24h Meropenem 1gm IV q8h  Monitor CRRT tolerance, cx results, clinical pic - f/u vnac levels   Height: 6\' 4"  (193 cm) Weight: 80.1 kg (176 lb 9.4 oz) IBW/kg (Calculated) : 86.8  Temp (24hrs), Avg:98.1 F (36.7 C), Min:97.7 F (36.5 C), Max:98.6 F (37 C)  Recent Labs  Lab 07/12/23 1159 07/12/23 1207 07/13/23 0413 07/13/23 0416 07/14/23 0823 07/14/23 1603 07/14/23 2100 07/15/23 0415 07/15/23 0424 07/15/23 0428 07/15/23 1600 07/15/23 1602 07/15/23 1756 07/16/23 0403 07/16/23 0417 07/16/23 0757  WBC  --    < >  --    < >  --  27.4* 24.0* 23.8*  --   --   --  22.4*  --  23.0*  --   --   CREATININE  --    < >  --    < >  --  3.65* 3.62* 3.63*  --  2.40* 3.48*  --  3.61* 3.61*  --   --   LATICACIDVEN 2.0*  --  1.3  --  0.9  --   --   --  0.9  --   --   --   --   --  0.7  --   VANCOTROUGH  --   --   --   --   --   --   --   --   --   --   --   --   --   --   --  9*   < > = values in this interval not displayed.    Estimated Creatinine Clearance: 31.4 mL/min (A) (by C-G formula based on SCr of 3.61 mg/dL (H)).    No Known Allergies  Thank you for allowing pharmacy to participate in this patient's care,  Sherron Monday, PharmD, BCCCP Clinical Pharmacist  Phone: [phone] 07/16/2023 10:16 AM  Please check AMION for all [facility] Pharmacy phone numbers After 10:00 PM, call Main Pharmacy [phone]

## 2023-07-16 NOTE — Anesthesia Preprocedure Evaluation (Signed)
 Anesthesia Evaluation  Patient identified by MRN, date of birth, ID band Patient unresponsive    Reviewed: Patient's Chart, lab work & pertinent test results, Unable to perform ROS - Chart review only  Airway Mallampati: Intubated       Dental no notable dental hx.    Pulmonary neg pulmonary ROS    + decreased breath sounds  + intubated    Cardiovascular hypertension, + Past MI   Rhythm:Regular Rate:Normal  Cardiogenic shock 2/2 PE on VA ECMO now impella 5.5 (P2) with severe MR here for repair   Neuro/Psych negative neurological ROS  negative psych ROS   GI/Hepatic negative GI ROS, Neg liver ROS,,,  Endo/Other    Renal/GU negative Renal ROS  negative genitourinary   Musculoskeletal negative musculoskeletal ROS (+)    Abdominal   Peds  Hematology Lab Results      Component                Value               Date                      WBC                      23.0 (H)            07/16/2023                HGB                      9.5 (L)             07/16/2023                HCT                      28.0 (L)            07/16/2023                MCV                      84.1                07/16/2023                PLT                      92 (L)              07/16/2023             Lab Results      Component                Value               Date                      NA                       137                 07/16/2023                K  5.0                 07/16/2023                CO2                      28                  07/16/2023                GLUCOSE                  130 (H)             07/16/2023                BUN                      63 (H)              07/16/2023                CREATININE               3.61 (H)            07/16/2023                CALCIUM                  8.1 (L)             07/16/2023                GFRNONAA                 21 (L)              07/16/2023                 Anesthesia Other Findings severe mitral regurgitation, acute PE, VT/VF arrest, acute renal failure, acute hepatic failure and history of paroxysmal Atrial fibrillation.     S/p arrest on VA ECMO with Impella Cp. Changed to 5.5 on 3/3 here now for MR repair  Reproductive/Obstetrics                             Anesthesia Physical Anesthesia Plan  ASA: 5  Anesthesia Plan: General   Post-op Pain Management:    Induction: Intravenous  PONV Risk Score and Plan:   Airway Management Planned: Oral ETT  Additional Equipment: Arterial line, CVP, PA Cath and TEE  Intra-op Plan:   Post-operative Plan: Post-operative intubation/ventilation  Informed Consent:      History available from chart only  Plan Discussed with: CRNA  Anesthesia Plan Comments:        Anesthesia Quick Evaluation

## 2023-07-16 NOTE — Progress Notes (Addendum)
 Nutrition Follow-up  DOCUMENTATION CODES:   Not applicable  INTERVENTION:   Tube Feeding via Cortrak:  Pivot 1.5 with goal 65 ml/hr Add Pro-Source TF20 60 mL TID TF at goal provides 2580 kcals, 206 g of protein, 1186 mL of free water  Continue Renal MVI daily  NUTRITION DIAGNOSIS:   Increased nutrient needs related to acute illness as evidenced by estimated needs.  Being addressed via TF  GOAL:   Patient will meet greater than or equal to 90% of their needs  Progressing  MONITOR:   Diet advancement, Vent status, Labs, I & O's  REASON FOR ASSESSMENT:   Consult Enteral/tube feeding initiation and management (Trickle feeds)  ASSESSMENT:   38 y.o male presented with left-sided chest pain and shortness of breath, diagnosed with PE, coded and was intubated after 40 minutes of CPR. Subsequently ECMO started. PMH of PAF, chronic HFpEF, severe mitral regurgitation, hyperthyroidism. Noncompliance with medicines.  2/26 Admitted 2/27 V.tach/V.fib cardiac arrest, intubated, 40 minutes of CPR, tx to [facility], placed on VA ECMO 2/28 CRRT initiated via ECMO circuit, Citrate Protocol 3/01 Vital High Protein started at 20 ml/hr by MD 3/02 MD started titration of TF 3/03 Exchanged Impella CP for impella 5.5, Cortrak   Pt remains on vent support, VA ECMO, Impella 5.5, CRRT via ECMO circuit OR today for transcutaneous repair of MV Vasopressin 0.03  Pivot 1.5 on hold for OR today, tolerating titration up to 65 ml/hr prior to TF being held  700 mL of stool via FMS in 24 hours. Pt receiving Banatrol TF BID, also noted scheduled miralax and colace, in addition to reglan. Consider changing scheduled regimen to prn  Weight down to 80.1 kg; admit wt 95 kg  No pressure injuries per RN skin assessment  Labs: Sodium 136 (wdl), potassium 5.3 (H), BUN 63, Creatinine 3.61, phosphorus 3.4 (L) TSH <0.010, T4 0.43 Meds: reviewed  Diet Order:   Diet Order     None       EDUCATION NEEDS:    Not appropriate for education at this time  Skin:  Skin Assessment: Reviewed RN Assessment  Last BM:  3/5 700 mL via FMS  Height:   Ht Readings from Last 1 Encounters:  07/09/23 6\' 4"  (1.93 m)    Weight:   Wt Readings from Last 1 Encounters:  07/16/23 80.1 kg    Ideal Body Weight:  91.8 kg  BMI:  Body mass index is 21.5 kg/m.  Estimated Nutritional Needs:   Kcal:  2500-2800 kcals  Protein:  190-210 gm  Fluid:  >2L/day   Romelle Starcher MS, RDN, LDN, CNSC Registered Dietitian 3 Clinical Nutrition RD Inpatient Contact Info in Amion

## 2023-07-17 ENCOUNTER — Inpatient Hospital Stay (HOSPITAL_COMMUNITY)

## 2023-07-17 DIAGNOSIS — J189 Pneumonia, unspecified organism: Secondary | ICD-10-CM | POA: Diagnosis not present

## 2023-07-17 DIAGNOSIS — Z4682 Encounter for fitting and adjustment of non-vascular catheter: Secondary | ICD-10-CM | POA: Diagnosis not present

## 2023-07-17 DIAGNOSIS — I4891 Unspecified atrial fibrillation: Secondary | ICD-10-CM | POA: Diagnosis not present

## 2023-07-17 DIAGNOSIS — I469 Cardiac arrest, cause unspecified: Secondary | ICD-10-CM | POA: Diagnosis not present

## 2023-07-17 DIAGNOSIS — N170 Acute kidney failure with tubular necrosis: Secondary | ICD-10-CM | POA: Diagnosis not present

## 2023-07-17 DIAGNOSIS — I2699 Other pulmonary embolism without acute cor pulmonale: Secondary | ICD-10-CM | POA: Diagnosis not present

## 2023-07-17 DIAGNOSIS — E875 Hyperkalemia: Secondary | ICD-10-CM | POA: Diagnosis not present

## 2023-07-17 DIAGNOSIS — Z954 Presence of other heart-valve replacement: Secondary | ICD-10-CM

## 2023-07-17 DIAGNOSIS — I517 Cardiomegaly: Secondary | ICD-10-CM | POA: Diagnosis not present

## 2023-07-17 DIAGNOSIS — E872 Acidosis, unspecified: Secondary | ICD-10-CM | POA: Diagnosis not present

## 2023-07-17 DIAGNOSIS — I272 Pulmonary hypertension, unspecified: Secondary | ICD-10-CM | POA: Diagnosis not present

## 2023-07-17 DIAGNOSIS — R570 Cardiogenic shock: Secondary | ICD-10-CM | POA: Diagnosis not present

## 2023-07-17 DIAGNOSIS — R079 Chest pain, unspecified: Secondary | ICD-10-CM | POA: Diagnosis not present

## 2023-07-17 DIAGNOSIS — Z452 Encounter for adjustment and management of vascular access device: Secondary | ICD-10-CM | POA: Diagnosis not present

## 2023-07-17 DIAGNOSIS — J9601 Acute respiratory failure with hypoxia: Secondary | ICD-10-CM | POA: Diagnosis not present

## 2023-07-17 DIAGNOSIS — R918 Other nonspecific abnormal finding of lung field: Secondary | ICD-10-CM | POA: Diagnosis not present

## 2023-07-17 DIAGNOSIS — R0602 Shortness of breath: Secondary | ICD-10-CM | POA: Diagnosis not present

## 2023-07-17 LAB — POCT I-STAT 7, (LYTES, BLD GAS, ICA,H+H)
Acid-Base Excess: 0 mmol/L (ref 0.0–2.0)
Acid-Base Excess: 0 mmol/L (ref 0.0–2.0)
Acid-Base Excess: 0 mmol/L (ref 0.0–2.0)
Acid-Base Excess: 0 mmol/L (ref 0.0–2.0)
Acid-Base Excess: 0 mmol/L (ref 0.0–2.0)
Acid-Base Excess: 0 mmol/L (ref 0.0–2.0)
Acid-Base Excess: 0 mmol/L (ref 0.0–2.0)
Acid-Base Excess: 1 mmol/L (ref 0.0–2.0)
Acid-Base Excess: 2 mmol/L (ref 0.0–2.0)
Acid-base deficit: 4 mmol/L — ABNORMAL HIGH (ref 0.0–2.0)
Acid-base deficit: 1 mmol/L (ref 0.0–2.0)
Acid-base deficit: 1 mmol/L (ref 0.0–2.0)
Bicarbonate: 20.7 mmol/L (ref 20.0–28.0)
Bicarbonate: 23.6 mmol/L (ref 20.0–28.0)
Bicarbonate: 24.1 mmol/L (ref 20.0–28.0)
Bicarbonate: 24.2 mmol/L (ref 20.0–28.0)
Bicarbonate: 24.3 mmol/L (ref 20.0–28.0)
Bicarbonate: 24.3 mmol/L (ref 20.0–28.0)
Bicarbonate: 25.4 mmol/L (ref 20.0–28.0)
Bicarbonate: 25.4 mmol/L (ref 20.0–28.0)
Bicarbonate: 25.4 mmol/L (ref 20.0–28.0)
Bicarbonate: 25.5 mmol/L (ref 20.0–28.0)
Bicarbonate: 26.3 mmol/L (ref 20.0–28.0)
Bicarbonate: 26.9 mmol/L (ref 20.0–28.0)
Calcium, Ion: 1 mmol/L — ABNORMAL LOW (ref 1.15–1.40)
Calcium, Ion: 1.04 mmol/L — ABNORMAL LOW (ref 1.15–1.40)
Calcium, Ion: 1.04 mmol/L — ABNORMAL LOW (ref 1.15–1.40)
Calcium, Ion: 1.04 mmol/L — ABNORMAL LOW (ref 1.15–1.40)
Calcium, Ion: 1.05 mmol/L — ABNORMAL LOW (ref 1.15–1.40)
Calcium, Ion: 1.05 mmol/L — ABNORMAL LOW (ref 1.15–1.40)
Calcium, Ion: 1.07 mmol/L — ABNORMAL LOW (ref 1.15–1.40)
Calcium, Ion: 1.08 mmol/L — ABNORMAL LOW (ref 1.15–1.40)
Calcium, Ion: 1.08 mmol/L — ABNORMAL LOW (ref 1.15–1.40)
Calcium, Ion: 1.09 mmol/L — ABNORMAL LOW (ref 1.15–1.40)
Calcium, Ion: 1.1 mmol/L — ABNORMAL LOW (ref 1.15–1.40)
Calcium, Ion: 1.11 mmol/L — ABNORMAL LOW (ref 1.15–1.40)
HCT: 20 % — ABNORMAL LOW (ref 39.0–52.0)
HCT: 23 % — ABNORMAL LOW (ref 39.0–52.0)
HCT: 23 % — ABNORMAL LOW (ref 39.0–52.0)
HCT: 25 % — ABNORMAL LOW (ref 39.0–52.0)
HCT: 25 % — ABNORMAL LOW (ref 39.0–52.0)
HCT: 26 % — ABNORMAL LOW (ref 39.0–52.0)
HCT: 26 % — ABNORMAL LOW (ref 39.0–52.0)
HCT: 26 % — ABNORMAL LOW (ref 39.0–52.0)
HCT: 26 % — ABNORMAL LOW (ref 39.0–52.0)
HCT: 27 % — ABNORMAL LOW (ref 39.0–52.0)
HCT: 27 % — ABNORMAL LOW (ref 39.0–52.0)
HCT: 27 % — ABNORMAL LOW (ref 39.0–52.0)
Hemoglobin: 6.8 g/dL — CL (ref 13.0–17.0)
Hemoglobin: 7.8 g/dL — ABNORMAL LOW (ref 13.0–17.0)
Hemoglobin: 7.8 g/dL — ABNORMAL LOW (ref 13.0–17.0)
Hemoglobin: 8.5 g/dL — ABNORMAL LOW (ref 13.0–17.0)
Hemoglobin: 8.5 g/dL — ABNORMAL LOW (ref 13.0–17.0)
Hemoglobin: 8.8 g/dL — ABNORMAL LOW (ref 13.0–17.0)
Hemoglobin: 8.8 g/dL — ABNORMAL LOW (ref 13.0–17.0)
Hemoglobin: 8.8 g/dL — ABNORMAL LOW (ref 13.0–17.0)
Hemoglobin: 8.8 g/dL — ABNORMAL LOW (ref 13.0–17.0)
Hemoglobin: 9.2 g/dL — ABNORMAL LOW (ref 13.0–17.0)
Hemoglobin: 9.2 g/dL — ABNORMAL LOW (ref 13.0–17.0)
Hemoglobin: 9.2 g/dL — ABNORMAL LOW (ref 13.0–17.0)
O2 Saturation: 100 %
O2 Saturation: 100 %
O2 Saturation: 100 %
O2 Saturation: 84 %
O2 Saturation: 88 %
O2 Saturation: 90 %
O2 Saturation: 93 %
O2 Saturation: 97 %
O2 Saturation: 97 %
O2 Saturation: 98 %
O2 Saturation: 99 %
O2 Saturation: 99 %
Patient temperature: 36.1
Patient temperature: 36.2
Patient temperature: 36.2
Patient temperature: 36.2
Patient temperature: 36.3
Patient temperature: 36.4
Patient temperature: 36.4
Patient temperature: 36.7
Patient temperature: 36.7
Patient temperature: 36.8
Patient temperature: 37.1
Patient temperature: 37.5
Potassium: 4 mmol/L (ref 3.5–5.1)
Potassium: 4 mmol/L (ref 3.5–5.1)
Potassium: 4.2 mmol/L (ref 3.5–5.1)
Potassium: 4.4 mmol/L (ref 3.5–5.1)
Potassium: 4.5 mmol/L (ref 3.5–5.1)
Potassium: 4.6 mmol/L (ref 3.5–5.1)
Potassium: 4.6 mmol/L (ref 3.5–5.1)
Potassium: 4.6 mmol/L (ref 3.5–5.1)
Potassium: 4.7 mmol/L (ref 3.5–5.1)
Potassium: 4.8 mmol/L (ref 3.5–5.1)
Potassium: 4.8 mmol/L (ref 3.5–5.1)
Potassium: 5 mmol/L (ref 3.5–5.1)
Sodium: 135 mmol/L (ref 135–145)
Sodium: 135 mmol/L (ref 135–145)
Sodium: 135 mmol/L (ref 135–145)
Sodium: 136 mmol/L (ref 135–145)
Sodium: 137 mmol/L (ref 135–145)
Sodium: 137 mmol/L (ref 135–145)
Sodium: 138 mmol/L (ref 135–145)
Sodium: 138 mmol/L (ref 135–145)
Sodium: 138 mmol/L (ref 135–145)
Sodium: 138 mmol/L (ref 135–145)
Sodium: 138 mmol/L (ref 135–145)
Sodium: 142 mmol/L (ref 135–145)
TCO2: 22 mmol/L (ref 22–32)
TCO2: 25 mmol/L (ref 22–32)
TCO2: 25 mmol/L (ref 22–32)
TCO2: 25 mmol/L (ref 22–32)
TCO2: 25 mmol/L (ref 22–32)
TCO2: 25 mmol/L (ref 22–32)
TCO2: 27 mmol/L (ref 22–32)
TCO2: 27 mmol/L (ref 22–32)
TCO2: 27 mmol/L (ref 22–32)
TCO2: 27 mmol/L (ref 22–32)
TCO2: 28 mmol/L (ref 22–32)
TCO2: 28 mmol/L (ref 22–32)
pCO2 arterial: 34.2 mmHg (ref 32–48)
pCO2 arterial: 34.2 mmHg (ref 32–48)
pCO2 arterial: 35.4 mmHg (ref 32–48)
pCO2 arterial: 37.4 mmHg (ref 32–48)
pCO2 arterial: 38.3 mmHg (ref 32–48)
pCO2 arterial: 39 mmHg (ref 32–48)
pCO2 arterial: 41.3 mmHg (ref 32–48)
pCO2 arterial: 42.5 mmHg (ref 32–48)
pCO2 arterial: 43.2 mmHg (ref 32–48)
pCO2 arterial: 43.2 mmHg (ref 32–48)
pCO2 arterial: 43.7 mmHg (ref 32–48)
pCO2 arterial: 45.6 mmHg (ref 32–48)
pH, Arterial: 7.353 (ref 7.35–7.45)
pH, Arterial: 7.376 (ref 7.35–7.45)
pH, Arterial: 7.382 (ref 7.35–7.45)
pH, Arterial: 7.387 (ref 7.35–7.45)
pH, Arterial: 7.39 (ref 7.35–7.45)
pH, Arterial: 7.393 (ref 7.35–7.45)
pH, Arterial: 7.394 (ref 7.35–7.45)
pH, Arterial: 7.396 (ref 7.35–7.45)
pH, Arterial: 7.411 (ref 7.35–7.45)
pH, Arterial: 7.411 (ref 7.35–7.45)
pH, Arterial: 7.44 (ref 7.35–7.45)
pH, Arterial: 7.456 — ABNORMAL HIGH (ref 7.35–7.45)
pO2, Arterial: 130 mmHg — ABNORMAL HIGH (ref 83–108)
pO2, Arterial: 162 mmHg — ABNORMAL HIGH (ref 83–108)
pO2, Arterial: 203 mmHg — ABNORMAL HIGH (ref 83–108)
pO2, Arterial: 224 mmHg — ABNORMAL HIGH (ref 83–108)
pO2, Arterial: 234 mmHg — ABNORMAL HIGH (ref 83–108)
pO2, Arterial: 48 mmHg — ABNORMAL LOW (ref 83–108)
pO2, Arterial: 52 mmHg — ABNORMAL LOW (ref 83–108)
pO2, Arterial: 58 mmHg — ABNORMAL LOW (ref 83–108)
pO2, Arterial: 63 mmHg — ABNORMAL LOW (ref 83–108)
pO2, Arterial: 87 mmHg (ref 83–108)
pO2, Arterial: 93 mmHg (ref 83–108)
pO2, Arterial: 93 mmHg (ref 83–108)

## 2023-07-17 LAB — RENAL FUNCTION PANEL
Albumin: 2.6 g/dL — ABNORMAL LOW (ref 3.5–5.0)
Albumin: 2.6 g/dL — ABNORMAL LOW (ref 3.5–5.0)
Anion gap: 13 (ref 5–15)
Anion gap: 13 (ref 5–15)
BUN: 64 mg/dL — ABNORMAL HIGH (ref 6–20)
BUN: 68 mg/dL — ABNORMAL HIGH (ref 6–20)
CO2: 23 mmol/L (ref 22–32)
CO2: 22 mmol/L (ref 22–32)
Calcium: 8 mg/dL — ABNORMAL LOW (ref 8.9–10.3)
Calcium: 7.7 mg/dL — ABNORMAL LOW (ref 8.9–10.3)
Chloride: 101 mmol/L (ref 98–111)
Chloride: 99 mmol/L (ref 98–111)
Creatinine, Ser: 3.81 mg/dL — ABNORMAL HIGH (ref 0.61–1.24)
Creatinine, Ser: 3.64 mg/dL — ABNORMAL HIGH (ref 0.61–1.24)
GFR, Estimated: 20 mL/min — ABNORMAL LOW (ref >60)
GFR, Estimated: 21 mL/min — ABNORMAL LOW (ref >60)
Glucose, Bld: 131 mg/dL — ABNORMAL HIGH (ref 70–99)
Glucose, Bld: 258 mg/dL — ABNORMAL HIGH (ref 70–99)
Phosphorus: 4.3 mg/dL (ref 2.5–4.6)
Phosphorus: 2.2 mg/dL — ABNORMAL LOW (ref 2.5–4.6)
Potassium: 4.6 mmol/L (ref 3.5–5.1)
Potassium: 4.6 mmol/L (ref 3.5–5.1)
Sodium: 137 mmol/L (ref 135–145)
Sodium: 134 mmol/L — ABNORMAL LOW (ref 135–145)

## 2023-07-17 LAB — CBC
HCT: 24.4 % — ABNORMAL LOW (ref 39.0–52.0)
HCT: 26.7 % — ABNORMAL LOW (ref 39.0–52.0)
HCT: 26.8 % — ABNORMAL LOW (ref 39.0–52.0)
Hemoglobin: 7.8 g/dL — ABNORMAL LOW (ref 13.0–17.0)
Hemoglobin: 8.6 g/dL — ABNORMAL LOW (ref 13.0–17.0)
Hemoglobin: 8.8 g/dL — ABNORMAL LOW (ref 13.0–17.0)
MCH: 27.5 pg (ref 26.0–34.0)
MCH: 27.7 pg (ref 26.0–34.0)
MCH: 28.2 pg (ref 26.0–34.0)
MCHC: 32 g/dL (ref 30.0–36.0)
MCHC: 32.2 g/dL (ref 30.0–36.0)
MCHC: 32.8 g/dL (ref 30.0–36.0)
MCV: 85.9 fL (ref 80.0–100.0)
MCV: 85.9 fL (ref 80.0–100.0)
MCV: 85.9 fL (ref 80.0–100.0)
Platelets: 88 10*3/uL — ABNORMAL LOW (ref 150–400)
Platelets: 97 10*3/uL — ABNORMAL LOW (ref 150–400)
Platelets: 132 10*3/uL — ABNORMAL LOW (ref 150–400)
RBC: 2.84 MIL/uL — ABNORMAL LOW (ref 4.22–5.81)
RBC: 3.11 MIL/uL — ABNORMAL LOW (ref 4.22–5.81)
RBC: 3.12 MIL/uL — ABNORMAL LOW (ref 4.22–5.81)
RDW: 21.9 % — ABNORMAL HIGH (ref 11.5–15.5)
RDW: 21 % — ABNORMAL HIGH (ref 11.5–15.5)
RDW: 21.3 % — ABNORMAL HIGH (ref 11.5–15.5)
WBC: 19 10*3/uL — ABNORMAL HIGH (ref 4.0–10.5)
WBC: 18.1 10*3/uL — ABNORMAL HIGH (ref 4.0–10.5)
WBC: 24.8 10*3/uL — ABNORMAL HIGH (ref 4.0–10.5)
nRBC: 0 % (ref 0.0–0.2)
nRBC: 0.1 % (ref 0.0–0.2)
nRBC: 0.2 % (ref 0.0–0.2)

## 2023-07-17 LAB — GLUCOSE, CAPILLARY
Glucose-Capillary: 146 mg/dL — ABNORMAL HIGH (ref 70–99)
Glucose-Capillary: 148 mg/dL — ABNORMAL HIGH (ref 70–99)
Glucose-Capillary: 251 mg/dL — ABNORMAL HIGH (ref 70–99)
Glucose-Capillary: 272 mg/dL — ABNORMAL HIGH (ref 70–99)
Glucose-Capillary: 137 mg/dL — ABNORMAL HIGH (ref 70–99)

## 2023-07-17 LAB — ECHOCARDIOGRAM COMPLETE
AR max vel: 3.46 cm2
AV Area VTI: 3.15 cm2
AV Area mean vel: 3.41 cm2
AV Mean grad: 0 mmHg
AV Peak grad: 1.1 mmHg
Ao pk vel: 0.54 m/s
Area-P 1/2: 1.99 cm2
Est EF: <20
Height: 76 in
MV VTI: 0.9 cm2
S' Lateral: 3.8 cm
Single Plane A4C EF: 31.9 %
Weight: 2761.92 [oz_av]

## 2023-07-17 LAB — HEPATIC FUNCTION PANEL
ALT: 50 U/L — ABNORMAL HIGH (ref 0–44)
AST: 98 U/L — ABNORMAL HIGH (ref 15–41)
Albumin: 2.6 g/dL — ABNORMAL LOW (ref 3.5–5.0)
Alkaline Phosphatase: 79 U/L (ref 38–126)
Bilirubin, Direct: 0.8 mg/dL — ABNORMAL HIGH (ref 0.0–0.2)
Indirect Bilirubin: 1.4 mg/dL — ABNORMAL HIGH (ref 0.3–0.9)
Total Bilirubin: 2.2 mg/dL — ABNORMAL HIGH (ref 0.0–1.2)
Total Protein: 6.4 g/dL — ABNORMAL LOW (ref 6.5–8.1)

## 2023-07-17 LAB — FIBRINOGEN: Fibrinogen: 596 mg/dL — ABNORMAL HIGH (ref 210–475)

## 2023-07-17 LAB — BASIC METABOLIC PANEL
Anion gap: 13 (ref 5–15)
BUN: 67 mg/dL — ABNORMAL HIGH (ref 6–20)
CO2: 20 mmol/L — ABNORMAL LOW (ref 22–32)
Calcium: 8 mg/dL — ABNORMAL LOW (ref 8.9–10.3)
Chloride: 102 mmol/L (ref 98–111)
Creatinine, Ser: 3.95 mg/dL — ABNORMAL HIGH (ref 0.61–1.24)
GFR, Estimated: 19 mL/min — ABNORMAL LOW (ref >60)
Glucose, Bld: 135 mg/dL — ABNORMAL HIGH (ref 70–99)
Potassium: 4.6 mmol/L (ref 3.5–5.1)
Sodium: 135 mmol/L (ref 135–145)

## 2023-07-17 LAB — APTT
aPTT: 62 s — ABNORMAL HIGH (ref 24–36)
aPTT: 73 s — ABNORMAL HIGH (ref 24–36)

## 2023-07-17 LAB — LACTATE DEHYDROGENASE: LDH: 2091 U/L — ABNORMAL HIGH (ref 98–192)

## 2023-07-17 LAB — PROTIME-INR
INR: 1.3 — ABNORMAL HIGH (ref 0.8–1.2)
Prothrombin Time: 16.3 s — ABNORMAL HIGH (ref 11.4–15.2)

## 2023-07-17 LAB — PREPARE RBC (CROSSMATCH)

## 2023-07-17 LAB — CG4 I-STAT (LACTIC ACID): Lactic Acid, Venous: 0.9 mmol/L (ref 0.5–1.9)

## 2023-07-17 LAB — MAGNESIUM: Magnesium: 2.4 mg/dL (ref 1.7–2.4)

## 2023-07-17 MED ORDER — SODIUM CHLORIDE 0.9% IV SOLUTION: Freq: Once | INTRAVENOUS | Status: AC

## 2023-07-17 MED ORDER — AMIODARONE HCL IN DEXTROSE 360-4.14 MG/200ML-% IV SOLN
30.0000 mg/h | INTRAVENOUS | Status: DC
  Administered 2023-07-17: 30 mg/h via INTRAVENOUS
  Administered 2023-07-17: 60 mg/h via INTRAVENOUS
  Administered 2023-07-17 – 2023-07-18 (×2): 30 mg/h via INTRAVENOUS
  Administered 2023-07-18 (×2): 60 mg/h via INTRAVENOUS
  Administered 2023-07-18: 59.9999 mg/h via INTRAVENOUS
  Administered 2023-07-19 – 2023-07-24 (×20): 60 mg/h via INTRAVENOUS
  Administered 2023-07-24: 30 mg/h via INTRAVENOUS
  Filled 2023-07-17 (×28): qty 200

## 2023-07-17 MED ORDER — SODIUM CHLORIDE 0.9 % IV SOLN
0.0270 mg/kg/h | INTRAVENOUS | Status: DC
  Filled 2023-07-17: qty 250

## 2023-07-17 MED ORDER — MAGNESIUM SULFATE 2 GM/50ML IV SOLN
2.0000 g | Freq: Once | INTRAVENOUS | Status: AC
  Administered 2023-07-17: 2 g via INTRAVENOUS
  Filled 2023-07-17: qty 50

## 2023-07-17 MED ORDER — AMIODARONE LOAD VIA INFUSION
150.0000 mg | Freq: Once | INTRAVENOUS | Status: AC
  Administered 2023-07-17: 150 mg via INTRAVENOUS
  Filled 2023-07-17: qty 83.34

## 2023-07-17 MED ORDER — LIDOCAINE IN D5W 4-5 MG/ML-% IV SOLN
1.0000 mg/min | INTRAVENOUS | Status: DC
  Administered 2023-07-17 – 2023-07-19 (×2): 1 mg/min via INTRAVENOUS
  Filled 2023-07-17 (×2): qty 500

## 2023-07-17 MED ORDER — LIDOCAINE HCL (CARDIAC) PF 100 MG/5ML IV SOSY
PREFILLED_SYRINGE | INTRAVENOUS | Status: AC
Start: 2023-07-17 — End: 2023-07-17
  Administered 2023-07-17: 100 mg via INTRAVENOUS
  Filled 2023-07-17: qty 5

## 2023-07-17 MED ORDER — PERFLUTREN LIPID MICROSPHERE
1.0000 mL | INTRAVENOUS | Status: AC | PRN
  Administered 2023-07-17: 2 mL via INTRAVENOUS

## 2023-07-17 MED ORDER — LIDOCAINE HCL (CARDIAC) PF 100 MG/5ML IV SOSY: 100.0000 mg | PREFILLED_SYRINGE | Freq: Once | INTRAVENOUS | Status: AC

## 2023-07-17 MED ORDER — MAGNESIUM SULFATE 4 GM/100ML IV SOLN: 4.0000 g | Freq: Once | INTRAVENOUS | Status: DC

## 2023-07-17 MED ORDER — LIDOCAINE IN D5W 4-5 MG/ML-% IV SOLN
INTRAVENOUS | Status: AC
  Filled 2023-07-17: qty 500

## 2023-07-17 MED ORDER — AMIODARONE IV BOLUS ONLY 150 MG/100ML
150.0000 mg | Freq: Once | INTRAVENOUS | Status: AC
  Administered 2023-07-19: 150 mg via INTRAVENOUS

## 2023-07-17 MED ORDER — HYDROCORTISONE SOD SUC (PF) 100 MG IJ SOLR
25.0000 mg | Freq: Two times a day (BID) | INTRAMUSCULAR | Status: DC
  Administered 2023-07-17 – 2023-07-18 (×4): 25 mg via INTRAVENOUS
  Filled 2023-07-17 (×4): qty 2

## 2023-07-17 NOTE — Progress Notes (Signed)
 PHARMACY - ANTICOAGULATION CONSULT NOTE  Pharmacy Consult for bivalirudin Indication:  ECMO, Impella  No Known Allergies  Patient Measurements: Height: 6\' 4"  (193 cm) Weight: (S) 78.3 kg (172 lb 9.9 oz) (WITHOUT FOOTBOARD ON) IBW/kg (Calculated) : 86.8 Heparin Dosing Weight: n/a  Vital Signs: Temp: 97.3 F (36.3 C) (03/06 0700) Temp Source: Core (03/06 0400) BP: 100/75 (03/06 0325) Pulse Rate: 58 (03/06 0700)  Labs: Recent Labs    07/15/23 0415 07/15/23 0423 07/16/23 0403 07/16/23 0411 07/16/23 1635 07/16/23 1636 07/16/23 1644 07/16/23 1939 07/16/23 2335 07/17/23 0045 07/17/23 0104 07/17/23 0115 07/17/23 0247 07/17/23 0321 07/17/23 0636 07/17/23 0639  HGB 8.6*   < > 9.2*   < > 8.1*  --    < >  --    < > 7.8*   < >  --    < > 8.5* 8.6* 9.2*  HCT 25.1*   < > 27.6*   < > 25.1*  --    < >  --    < > 24.4*   < >  --    < > 25.0* 26.7* 27.0*  PLT 84*   < > 92*  --  81*  --   --   --   --  88*  --   --   --   --  97*  --   APTT 32   < > 57*  --  >200*  --   --  76*  --   --   --  62*  --   --   --   --   LABPROT 14.2  --  16.4*  --   --   --   --   --   --   --   --  16.3*  --   --   --   --   INR 1.1  --  1.3*  --   --   --   --   --   --   --   --  1.3*  --   --   --   --   CREATININE 3.63*   < > 3.61*   < > 4.11* 4.23*  --   --   --   --   --  3.95*  3.81*  --   --   --   --    < > = values in this interval not displayed.    Estimated Creatinine Clearance: 29.1 mL/min (A) (by C-G formula based on SCr of 3.81 mg/dL (H)).   Medical History: Past Medical History:  Diagnosis Date   Atrial fibrillation [facility])    Hyperthyroidism    Mitral regurgitation    NSTEMI (non-ST elevated myocardial infarction) [facility])     Assessment: 38 yo M with bilateral PE s/p TNK (2/27 @1156 ) now on [place] ECMO + Impella. Pharmacy consulted for bivalirudin for anticoagulation.   S/p impella CP >5.5 3/3 - bivalirudin was previously held with oozing at insertion site but restart 3/4. Patient  underwent mitraclip yesterday - bivalirudin was delayed on start. aPTT is therapeutic at 62, on bivalirudin@0 .03 mg/kg/hr. Hgb 8.6, plt 97. LDH 2091. LFTs continue to trend down, total bilirubin remains stable at 2.2. Continued oozing near cannula site where access obtained for mitraclip. No fibrin in ECMO circuit, impella flowing without issues.   Goal of Therapy:  aPTT 50-70 seconds Monitor platelets by anticoagulation protocol: Yes   Plan:  Reduce bivalirudin to 0.027 mg/kg/hr (using dose weight 97.5 kg)  to keep aPTT on lower end of goal to help with oozing aPTT q12h  F/u CBC, LDH, fibrinogen Monitor s/s bleeding   Thank you for allowing pharmacy to participate in this patient's care,  Sherron Monday, PharmD, BCCCP Clinical Pharmacist  Phone: [phone] 07/17/2023 7:48 AM  Please check AMION for all [facility] Pharmacy phone numbers After 10:00 PM, call Main Pharmacy [phone]

## 2023-07-17 NOTE — Procedures (Signed)
 Admit: 07/09/2023 LOS: 8  57M AKI likely ATN after SCA, cardiogenic shock on ECMO and Impella, AHRF/VDRF  Current CRRT Prescription: Start Date: 07/11/23 Catheter: Using ECMO circuit BFR: 115 Pre Blood Pump: 340 4K DFR: 1500 4K Replacement Rate: 500 2K Goal UF: -268mL/h Anticoagulation: bivalrudin Clotting: none  S: Status post transcutaneous mitral valve repair yesterday Remains on ECMO K stable after changing replacement post to 2K bath P 4.3 No clotting on angiomax  O: 03/05 0701 - 03/06 0700 In: 3240.7 [I.V.:1347.3; Blood:210; NG/GT:406.3; IV Piggyback:1062] Out: 5779.3 [Urine:5; Stool:200]  Filed Weights   07/15/23 0500 07/16/23 0500 07/17/23 0500  Weight: 85.9 kg 80.1 kg (S) 78.3 kg    Recent Labs  Lab 07/16/23 0403 07/16/23 0411 07/16/23 1635 07/16/23 1636 07/16/23 1644 07/17/23 0115 07/17/23 0247 07/17/23 0321 07/17/23 0639 07/17/23 0824  NA 136   < > 138 138   < > 135  137   < > 137 138 138  K 5.3*   < > 5.3* 5.2*   < > 4.6  4.6   < > 5.0 4.8 4.8  CL 100   < > 102 103  --  102  101  --   --   --   --   CO2 28  --  23 25  --  20*  23  --   --   --   --   GLUCOSE 130*   < > 141* 147*  --  135*  131*  --   --   --   --   BUN 63*   < > 69* 72*  --  67*  64*  --   --   --   --   CREATININE 3.61*   < > 4.11* 4.23*  --  3.95*  3.81*  --   --   --   --   CALCIUM 8.1*  --  7.6* 7.7*  --  8.0*  8.0*  --   --   --   --   PHOS 3.4  --   --  5.5*  --  4.3  --   --   --   --    < > = values in this interval not displayed.   Recent Labs  Lab 07/16/23 1635 07/16/23 1644 07/17/23 0045 07/17/23 0104 07/17/23 0636 07/17/23 0639 07/17/23 0824  WBC 23.6*  --  19.0*  --  18.1*  --   --   HGB 8.1*   < > 7.8*   < > 8.6* 9.2* 9.2*  HCT 25.1*   < > 24.4*   < > 26.7* 27.0* 27.0*  MCV 86.3  --  85.9  --  85.9  --   --   PLT 81*  --  88*  --  97*  --   --    < > = values in this interval not displayed.    Scheduled Meds:  sodium chloride   Intravenous Once    aspirin  81 mg Oral Daily   Chlorhexidine Gluconate Cloth  6 each Topical Daily   clonazePAM  1 mg Oral BID   docusate  100 mg Oral BID   feeding supplement (PROSource TF20)  60 mL Per Tube TID   fentaNYL (SUBLIMAZE) injection  50 mcg Intravenous Once   fiber supplement (BANATROL TF)  60 mL Per Tube BID   folic acid  1 mg Oral Daily   hydrocortisone sod succinate (SOLU-CORTEF) inj  25 mg Intravenous Q12H  insulin aspart  0-20 Units Subcutaneous Q4H   insulin aspart  5 Units Subcutaneous Q4H   methimazole  10 mg Oral Q8H   metoCLOPramide (REGLAN) injection  10 mg Intravenous Q8H   multivitamin  1 tablet Oral QHS   mouth rinse  15 mL Mouth Rinse Q2H   oxyCODONE  5 mg Oral Q6H   pantoprazole (PROTONIX) IV  40 mg Intravenous QHS   polyethylene glycol  17 g Oral Daily   QUEtiapine  50 mg Oral BID   sodium bicarbonate  50 mEq Intravenous Once   sodium chloride flush  3 mL Intravenous Q12H   thiamine  100 mg Oral Daily   Or   thiamine  100 mg Intravenous Daily   Continuous Infusions:  albumin human 999 mL/hr at 07/17/23 0900   amiodarone 30 mg/hr (07/17/23 1013)   bivalirudin (ANGIOMAX) 250 mg in sodium chloride 0.9 % 500 mL (0.5 mg/mL) infusion 0.027 mg/kg/hr (07/17/23 0900)   dexmedetomidine (PRECEDEX) IV infusion 1.2 mcg/kg/hr (07/17/23 0951)   feeding supplement (PIVOT 1.5 CAL) 65 mL/hr at 07/17/23 0900   HYDROmorphone 4 mg/hr (07/17/23 0900)   meropenem (MERREM) IV Stopped (07/17/23 0610)   midazolam 4 mg/hr (07/17/23 0900)   milrinone 0.25 mcg/kg/min (07/17/23 0900)   norepinephrine (LEVOPHED) Adult infusion 1 mcg/min (07/17/23 0900)   PrismaSol BGK 2/3.5 500 mL/hr at 07/17/23 1029   prismasol BGK 4/2.5 1,500 mL/hr at 07/17/23 1026   prismasol BGK 4/2.5 340 mL/hr at 07/17/23 0954   sodium bicarbonate 25 mEq (Impella PURGE) in dextrose 5 % 1000 mL bag 5.8 mL/hr at 07/13/23 0248   vancomycin 1,500 mg (07/17/23 1024)   vasopressin 0.03 Units/min (07/17/23 0956)   PRN  Meds:.acetaminophen **OR** acetaminophen, albumin human, heparin, HYDROmorphone, midazolam, mouth rinse, perflutren lipid microspheres (DEFINITY) IV suspension  ABG    Component Value Date/Time   PHART 7.376 07/17/2023 0824   PCO2ART 43.2 07/17/2023 0824   PO2ART 58 (L) 07/17/2023 0824   HCO3 25.5 07/17/2023 0824   TCO2 27 07/17/2023 0824   ACIDBASEDEF 1.0 07/17/2023 0247   O2SAT 90 07/17/2023 0824   Inbutaed, sedated Cont hum  Coarse bs Sedated Ill appearing  A/P  Dialysis dependent anuric AKI 2/2 ATN from #2 and #3 on CRRT Cardiogenic Shock on ECMO + Impella; status post transcutaneous mitral valve repair VT + PEA SCA  PE on angiomax per CCM Anemia, Hb stable, per AHF Hyperkalemia, stable using 2K bath as post replacement fluid Severe MR for TEER 3/5  Continue CRRT at current settings.  AHF/CCM assisting with UF rates.   Sabra Heck, MD [facility]

## 2023-07-17 NOTE — Progress Notes (Signed)
 NAME:  Aaron [name], MRN:  [id number], DOB:  1986-01-05, LOS: 8 ADMISSION DATE:  07/09/2023, CONSULTATION DATE:  07/11/2023 REFERRING MD: Clearnce Hasten, CHIEF COMPLAINT: Status post cardiac arrest  History of Present Illness:  38 year old male with hypothyroidism, paroxysmal A-fib, severe mitral regurgitation who presented to [facility] long hospital with chest pain and shortness of breath for few days associated cough, nausea and diarrhea.  In the emergency department he was diagnosed with PE, was started on IV heparin.  He takes beta-blocker, steroid and methimazole for hyperthyroidism.  On 2/27 patient rapidly deteriorated developed V. tach/V-fib cardiac arrest patient was intubated after 40 minutes of CPR, cardiology was consulted patient was placed on VA ECMO and was transferred to [facility]  Pertinent  Medical History   Past Medical History:  Diagnosis Date   Atrial fibrillation [facility])    Hyperthyroidism    Mitral regurgitation    NSTEMI (non-ST elevated myocardial infarction) (HCC)     Significant Hospital Events: Including procedures, antibiotic start and stop dates in addition to other pertinent events     Interim History / Subjective:  Patient was started on milrinone overnight His PaO2 dropped to 50s FiO2 on ventilator is on 100% now Impella is at P3 with close to 2 L flow VA ECMO flows 4 L Underwent mitral clipping yesterday, tolerated well  Objective   Blood pressure 100/75, pulse 63, temperature 97.7 F (36.5 C), resp. rate 12, height 6\' 4"  (1.93 m), weight (S) 78.3 kg, SpO2 95%. PAP: (18-93)/(6-37) 75/26 CVP:  [0 mmHg-10 mmHg] 7 mmHg  Vent Mode: PRVC FiO2 (%):  [50 %-100 %] 80 % Set Rate:  [10 bmp] 10 bmp Vt Set:  [580 mL] 580 mL PEEP:  [5 cmH20] 5 cmH20 Plateau Pressure:  [20 cmH20-23 cmH20] 23 cmH20   Intake/Output Summary (Last 24 hours) at 07/17/2023 0851 Last data filed at 07/17/2023 0700 Gross per 24 hour  Intake 3168.35 ml  Output 5479.4 ml  Net  -2311.05 ml   Filed Weights   07/15/23 0500 07/16/23 0500 07/17/23 0500  Weight: 85.9 kg 80.1 kg (S) 78.3 kg    Examination: General: Crtitically ill-appearing young male, orally intubated HEENT: [place]/AT, eyes anicteric.  ETT and OGT in place Neuro: Sedated, not following commands.  Eyes are closed.  Pupils 3 mm bilateral reactive to light Chest: Coarse breath sounds, no wheezes or rhonchi.  5 5 Impella is inserted on left side of chest Heart: Regular rate and rhythm, no murmurs or gallops Abdomen: Soft, nondistended, bowel sounds present Skin: Multiple tattoo marks noted on chest and bilateral upper extremities Extremities: ECMO cannula noted in groin with some oozing   Labs and images reviewed  Resolved Hospital Problem list   Lactic acidosis, resolved Refractory hyperkalemia, improving  Assessment & Plan:  Status post in-hospital V. tach followed by PEA cardiac arrest Paroxysmal A-fib RVR, currently in sinus rhythm Severe mitral regurgitation status post mitral clips Severe pulmonary hypertension Acute biventricular HFrEF with cardiogenic shock on VA ECMO Acute pulmonary emboli status post TNK Acute respiratory failure with hypoxia Hyperthyroidism Acute kidney injury due to ischemic ATN on CRRT Acute respiratory failure with hypoxia Bilateral multifocal pneumonia with MSSA Hyperphosphatemia/hypocalcemia Shock liver Acute on chronic anemia due to critical illness and hemolysis Thrombocytopenia due to critical illness  Underwent mitral clips yesterday, tolerated well Currently on VA ECMO at 4 L flow Impella 5 5 at P4 with 2 L flow Will start iNO at 20 ppm His pulmonary artery pressures are close  to 80 Continue milrinone time, currently at 0.25 Will let her try to wean to see if he can tolerate Remain in sinus rhythm Closely monitor and supplement electrolytes On CRRT with plan to keep net -200 cc/h He was net -2.6 L yesterday CVP is at 10 Nephrology is  following Monitor PTT, continue bival Continue lung protective ventilation, FiO2 was increased to 100% yesterday, will titrate after INO's started PaO2 goal between 100-200 VAP prevention bundle in place Continue Precedex, Versed and Dilaudid Continue Precedex with RASS goal -2 Continue methimazole 10 mg 3 times daily Decrease stress dose steroid to 25 mg 2 times daily Monitor H&H and transfuse if less than 8 Respiratory culture is growing MSSA For continuing and meropenem for now today is day 7 LDH remain elevated with elevated indirect bilirubin which is suggestive of hemolysis Monitor platelet count, currently in 90s  Best Practice (right click and "Reselect all SmartList Selections" daily)   Diet/type: NPO TF DVT prophylaxis systemic bival Pressure ulcer(s): N/A GI prophylaxis: PPI Lines: Central line, Arterial Line, and yes and it is still needed.  ECMO cannula in place Foley:  Yes, and it is still needed Code Status:  full code Last date of multidisciplinary goals of care discussion [Per primary team]  Labs   CBC: Recent Labs  Lab 07/15/23 1602 07/15/23 2119 07/16/23 0403 07/16/23 0411 07/16/23 1635 07/16/23 1644 07/17/23 0045 07/17/23 0104 07/17/23 0247 07/17/23 0321 07/17/23 0636 07/17/23 0639 07/17/23 0824  WBC 22.4*  --  23.0*  --  23.6*  --  19.0*  --   --   --  18.1*  --   --   HGB 8.9*   < > 9.2*   < > 8.1*   < > 7.8*   < > 7.8* 8.5* 8.6* 9.2* 9.2*  HCT 26.7*   < > 27.6*   < > 25.1*   < > 24.4*   < > 23.0* 25.0* 26.7* 27.0* 27.0*  MCV 81.9  --  84.1  --  86.3  --  85.9  --   --   --  85.9  --   --   PLT 84*  --  92*  --  81*  --  88*  --   --   --  97*  --   --    < > = values in this interval not displayed.    Basic Metabolic Panel: Recent Labs  Lab 07/13/23 0403 07/13/23 0409 07/14/23 0327 07/14/23 0732 07/15/23 0415 07/15/23 0423 07/15/23 1600 07/15/23 1601 07/15/23 1756 07/15/23 2119 07/16/23 0403 07/16/23 0411 07/16/23 1341  07/16/23 1500 07/16/23 1635 07/16/23 1636 07/16/23 1644 07/17/23 0115 07/17/23 0247 07/17/23 0321 07/17/23 0639 07/17/23 0824  NA 138   < > 137   < > 136   < > 138   < > 137   < > 136   < > 139   < > 138 138   < > 135  137 138 137 138 138  K 3.9   < > 4.0   < > 3.8   < > 4.6   < > 4.5   < > 5.3*   < > 4.9   < > 5.3* 5.2*   < > 4.6  4.6 4.7 5.0 4.8 4.8  CL 93*   < > 92*   < > 94*   < > 99  --  101  --  100  --  103  --  102 103  --  102  101  --   --   --   --   CO2 32   < > 34*   < > 29  --  27  --  27  --  28  --   --   --  23 25  --  20*  23  --   --   --   --   GLUCOSE 223*   < > 219*   < > 170*   < > 185*  --  214*  --  130*  --  153*  --  141* 147*  --  135*  131*  --   --   --   --   BUN 36*   < > 46*   < > 56*   < > 58*  --  59*  --  63*  --  67*  --  69* 72*  --  67*  64*  --   --   --   --   CREATININE 3.09*   < > 3.26*   < > 3.63*   < > 3.48*  --  3.61*  --  3.61*  --  4.40*  --  4.11* 4.23*  --  3.95*  3.81*  --   --   --   --   CALCIUM 9.5   < > 8.9   < > 8.5*  --  8.1*  --  7.9*  --  8.1*  --   --   --  7.6* 7.7*  --  8.0*  8.0*  --   --   --   --   MG 2.4  --  2.2  --  2.0  --   --   --   --   --  2.5*  --   --   --   --   --   --  2.4  --   --   --   --   PHOS 2.9  --  2.5   < > 2.2*  --  3.0  --   --   --  3.4  --   --   --   --  5.5*  --  4.3  --   --   --   --    < > = values in this interval not displayed.   GFR: Estimated Creatinine Clearance: 29.1 mL/min (A) (by C-G formula based on SCr of 3.81 mg/dL (H)). Recent Labs  Lab 07/14/23 0823 07/14/23 1603 07/15/23 0424 07/15/23 1602 07/16/23 0403 07/16/23 0417 07/16/23 1635 07/17/23 0045 07/17/23 0325 07/17/23 0636  WBC  --    < >  --    < > 23.0*  --  23.6* 19.0*  --  18.1*  LATICACIDVEN 0.9  --  0.9  --   --  0.7  --   --  0.9  --    < > = values in this interval not displayed.    Liver Function Tests: Recent Labs  Lab 07/13/23 0403 07/14/23 0327 07/14/23 1603 07/15/23 0415 07/15/23 1600  07/16/23 0403 07/16/23 1636 07/17/23 0115  AST 970* 767*  --  538*  --  197*  --  98*  ALT 355* 224*  --  138*  --  90*  --  50*  ALKPHOS 74 68  --  73  --  91  --  79  BILITOT 7.3* 7.9*  --  4.2*  --  2.2*  --  2.2*  PROT 4.7* NOT CALCULATED  --  5.1*  --  6.2*  --  6.4*  ALBUMIN 2.0* 2.2*   < > 2.1*  2.1* 2.2* 2.4*  2.4* 2.2* 2.6*  2.6*   < > = values in this interval not displayed.   No results for input(s): "LIPASE", "AMYLASE" in the last 168 hours.  No results for input(s): "AMMONIA" in the last 168 hours.  ABG    Component Value Date/Time   PHART 7.376 07/17/2023 0824   PCO2ART 43.2 07/17/2023 0824   PO2ART 58 (L) 07/17/2023 0824   HCO3 25.5 07/17/2023 0824   TCO2 27 07/17/2023 0824   ACIDBASEDEF 1.0 07/17/2023 0247   O2SAT 90 07/17/2023 0824     Coagulation Profile: Recent Labs  Lab 07/13/23 0403 07/14/23 0327 07/15/23 0415 07/16/23 0403 07/17/23 0115  INR 1.5* 1.4* 1.1 1.3* 1.3*    Cardiac Enzymes: Recent Labs  Lab 07/11/23 0512 07/11/23 1854  CKTOTAL 117 357    HbA1C: Hgb A1c MFr Bld  Date/Time Value Ref Range Status  07/10/2023 06:44 AM 4.8 4.8 - 5.6 % Final    Comment:    (NOTE) Pre diabetes:          5.7%-6.4%  Diabetes:              >6.4%  Glycemic control for   <7.0% adults with diabetes   10/20/2021 01:07 AM 4.2 (L) 4.8 - 5.6 % Final    Comment:    (NOTE) Pre diabetes:          5.7%-6.4%  Diabetes:              >6.4%  Glycemic control for   <7.0% adults with diabetes     CBG: Recent Labs  Lab 07/16/23 1643 07/16/23 1936 07/16/23 2333 07/17/23 0319 07/17/23 0822  GLUCAP 142* 116* 113* 146* 148*    Critical care time:     The patient is critically ill due to cardiogenic shock status post VA ECMO.  Critical care was necessary to treat or prevent imminent or life-threatening deterioration.  Critical care was time spent personally by me on the following activities: development of treatment plan with patient and/or  surrogate as well as nursing, discussions with consultants, evaluation of patient's response to treatment, examination of patient, obtaining history from patient or surrogate, ordering and performing treatments and interventions, ordering and review of laboratory studies, ordering and review of radiographic studies, pulse oximetry, re-evaluation of patient's condition and participation in multidisciplinary rounds.   During this encounter critical care time was devoted to patient care services described in this note for 46 minutes.     Cheri Fowler, MD [facility] See Amion for pager If no response to pager, please call [phone] until 7pm After 7pm, Please call E-link [phone]

## 2023-07-17 NOTE — Progress Notes (Signed)
 PHARMACY - ANTICOAGULATION CONSULT NOTE  Pharmacy Consult for bivalirudin Indication:  ECMO, Impella  No Known Allergies  Patient Measurements: Height: 6\' 4"  (193 cm) Weight: (S) 78.3 kg (172 lb 9.9 oz) (WITHOUT FOOTBOARD ON) IBW/kg (Calculated) : 86.8 Heparin Dosing Weight: n/a  Vital Signs: Temp: 99.9 F (37.7 C) (03/06 1845) Pulse Rate: 75 (03/06 1845)  Labs: Recent Labs    07/15/23 0415 07/15/23 0423 07/16/23 0403 07/16/23 0411 07/16/23 1636 07/16/23 1644 07/16/23 1939 07/16/23 2335 07/17/23 0045 07/17/23 0104 07/17/23 0115 07/17/23 0247 07/17/23 0636 07/17/23 0639 07/17/23 1521 07/17/23 1720 07/17/23 1730  HGB 8.6*   < > 9.2*   < >  --    < >  --    < > 7.8*   < >  --    < > 8.6*   < > 8.8* 8.8* 8.8*  HCT 25.1*   < > 27.6*   < >  --    < >  --    < > 24.4*   < >  --    < > 26.7*   < > 26.0* 26.8* 26.0*  PLT 84*   < > 92*   < >  --   --   --   --  88*  --   --   --  97*  --   --  132*  --   APTT 32   < > 57*   < >  --   --  76*  --   --   --  62*  --   --   --   --  73*  --   LABPROT 14.2  --  16.4*  --   --   --   --   --   --   --  16.3*  --   --   --   --   --   --   INR 1.1  --  1.3*  --   --   --   --   --   --   --  1.3*  --   --   --   --   --   --   CREATININE 3.63*   < > 3.61*   < > 4.23*  --   --   --   --   --  3.95*  3.81*  --   --   --   --  3.64*  --    < > = values in this interval not displayed.    Estimated Creatinine Clearance: 30.5 mL/min (A) (by C-G formula based on SCr of 3.64 mg/dL (H)).   Medical History: Past Medical History:  Diagnosis Date   Atrial fibrillation (HCC)    Hyperthyroidism    Mitral regurgitation    NSTEMI (non-ST elevated myocardial infarction) [facility])     Assessment: 38 yo [name] with bilateral PE s/p TNK (2/27 @1156 ) now on [place] ECMO + Impella. Pharmacy consulted for bivalirudin for anticoagulation.   S/p impella CP >5.5 3/3 - bivalirudin was previously held with oozing at insertion site but restart 3/4. Patient  underwent mitraclip yesterday - bivalirudin was delayed on start. aPTT is therapeutic at 62, on bivalirudin@0 .03 mg/kg/hr. Hgb 8.6, plt 97. LDH 2091. LFTs continue to trend down, total bilirubin remains stable at 2.2. Continued oozing near cannula site where access obtained for mitraclip. No fibrin in ECMO circuit, impella flowing without issues.   APTT this evening is above the established goal.  No overt bleeding or complications noted.  Goal of Therapy:  aPTT 50-70 seconds Monitor platelets by anticoagulation protocol: Yes   Plan:  Continue bivalirudin at 0.027 mg/kg/hr, however, will change dosing weight from previous of 97.5 to his current weight of 78.3.  This will effectively be a 20% reduction in bivalirudin dose. aPTT q12h  F/u CBC, LDH, fibrinogen Monitor s/s bleeding   Thank you for allowing pharmacy to participate in this patient's care,  Jenetta Downer, [facility] Clinical Pharmacist  07/17/2023 7:16 PM   [facility] pharmacy phone numbers are listed on [email or web address]  Please check AMION for all [facility] phone numbers After 10:00 PM, call Main Pharmacy [phone]

## 2023-07-17 NOTE — Progress Notes (Signed)
  Echocardiogram 2D Echocardiogram has been performed.  Ocie Doyne RDCS 07/17/2023, 9:38 AM

## 2023-07-17 NOTE — Progress Notes (Signed)
     Referral previously received for Aaron [name] for goals of care discussion. Noted most recent palliative in-person assessment dated 07/15/2023 at which time it was recommended to follow from a distance/chart check.  Chart reviewed for Recent provider notes, nurse notes, vitals, labs, and imaging and updates received from RN.   At this time patient appears stable, underwent TEER procedure yesterday with improvement in MR. Continues with ECMO wean on Impella 5.5, planned attempt wean today, possibly need VP ECMO if fails. No plan for in person follow-up today. Will plan for PMT follow-up tomorrow for ongoing support of patient and family.  Please contact the palliative medicine provider on service for any new/urgent needs that require our assistance with this patient.  Thank you for your referral and allowing PMT to assist in Aaron [name]'s care.   Wynne Dust, NP Palliative Medicine Team Phone: [phone]  NO CHARGE

## 2023-07-17 NOTE — Progress Notes (Signed)
 1 Day Post-Op Procedure(s) (LRB): TRANSCATHETER MITRAL EDGE TO EDGE REPAIR (N/A) TRANSESOPHAGEAL ECHOCARDIOGRAM (N/A) Subjective: Intubated, sedated  Objective: Vital signs in last 24 hours: Temp:  [97 F (36.1 C)-98.2 F (36.8 C)] 98.2 F (36.8 C) (03/06 1315) Pulse Rate:  [40-70] 69 (03/06 1315) Cardiac Rhythm: Normal sinus rhythm (03/06 1200) Resp:  [10-23] 11 (03/06 1315) BP: (100)/(75) 100/75 (03/06 0325) SpO2:  [87 %-100 %] 100 % (03/06 1315) Arterial Line BP: (60-125)/(51-83) 104/65 (03/06 1315) FiO2 (%):  [50 %-100 %] 80 % (03/06 1036) Weight:  [78.3 kg] 78.3 kg (03/06 0500)  Hemodynamic parameters for last 24 hours: PAP: (18-93)/(6-37) 54/19 CVP:  [0 mmHg-10 mmHg] 5 mmHg  Intake/Output from previous day: 03/05 0701 - 03/06 0700 In: 3240.7 [I.V.:1347.3; Blood:210; NG/GT:406.3; IV Piggyback:1062] Out: 5779.3 [Urine:5; Stool:200] Intake/Output this shift: Total I/O In: 1397.6 [I.V.:475.9; Other:61.8; NG/GT:560; IV Piggyback:299.8] Out: 2012.3   General appearance: intubated Neurologic: sedated Wound: dressing with old blood, no recent bleeding  Lab Results: Recent Labs    07/17/23 0045 07/17/23 0104 07/17/23 0636 07/17/23 0639 07/17/23 0934 07/17/23 1220  WBC 19.0*  --  18.1*  --   --   --   HGB 7.8*   < > 8.6*   < > 8.8* 9.2*  HCT 24.4*   < > 26.7*   < > 26.0* 27.0*  PLT 88*  --  97*  --   --   --    < > = values in this interval not displayed.   BMET:  Recent Labs    07/16/23 1636 07/16/23 1644 07/17/23 0115 07/17/23 0247 07/17/23 0934 07/17/23 1220  NA 138   < > 135  137   < > 136 135  K 5.2*   < > 4.6  4.6   < > 4.6 4.4  CL 103  --  102  101  --   --   --   CO2 25  --  20*  23  --   --   --   GLUCOSE 147*  --  135*  131*  --   --   --   BUN 72*  --  67*  64*  --   --   --   CREATININE 4.23*  --  3.95*  3.81*  --   --   --   CALCIUM 7.7*  --  8.0*  8.0*  --   --   --    < > = values in this interval not displayed.    PT/INR:   Recent Labs    07/17/23 0115  LABPROT 16.3*  INR 1.3*   ABG    Component Value Date/Time   PHART 7.353 07/17/2023 1220   HCO3 25.4 07/17/2023 1220   TCO2 27 07/17/2023 1220   ACIDBASEDEF 1.0 07/17/2023 0247   O2SAT 100 07/17/2023 1220   CBG (last 3)  Recent Labs    07/17/23 0319 07/17/23 0822 07/17/23 1219  GLUCAP 146* 148* 251*    Assessment/Plan: S/P Procedure(s) (LRB): TRANSCATHETER MITRAL EDGE TO EDGE REPAIR (N/A) TRANSESOPHAGEAL ECHOCARDIOGRAM (N/A) - Remains intubated sedated Impella up to p5 with 2.5 L flow, ECMO turned down Good pulsatility    LOS: 8 days    Aaron [name] 07/17/2023

## 2023-07-17 NOTE — CV Procedure (Signed)
 ECMO NOTE:   Indication: Cardiogenic shock   Initial cannulation date: 07/10/23   ECMO type: VA ECMO with Impella CP vent   Dual lumen inflow/return cannula:   1) 25 FR multi-stage venous drainage RFV 2) 21 FR arterial return LCFA 3) 6FR L SFA distal perfusion catheter 4) Impella CP via R CFA LV vent    Daily data:   Flow 3.67L RPM 3100 Sweep  3.5L Blender at 100%   Impella at P4 1.7L flow   Labs:   ABG    Component Value Date/Time   PHART 7.376 07/17/2023 0824   PCO2ART 43.2 07/17/2023 0824   PO2ART 58 (L) 07/17/2023 0824   HCO3 25.5 07/17/2023 0824   TCO2 27 07/17/2023 0824   ACIDBASEDEF 1.0 07/17/2023 0247   O2SAT 90 07/17/2023 0824    Hgb 7.8 Platelets 92 LDH >2091 PTT 62; discussed dosing with pharmD Lactic acid 0.9   Plan:  - Continue V-A ECMO support.  - Start inhaled nitric today - Continue pulling aggressively on CRRT - Continue ECMO wean.    Aaron Settlemire, DO  8:30 AM

## 2023-07-17 NOTE — Progress Notes (Signed)
 Advanced Heart Failure Rounding Note  Cardiologist: Christell Constant, MD  Chief Complaint: V-A ECMO  Subjective:    Admitted 2/26 with worsening shortness of breath, chest pain, atrial fibrillation with RVR. Decompensated 2/27 with IVF, nodal blockade with subsequent respiratory arrest, ROSC achieved after ~16 minute down time Femoral VA ecmo cannulation 2/27 with Impella CP vent  - CVP 9, PA 77/35(48) on V-A ECMO flows at 3.76PM, Impella at P4 flowing 1.7LPM.  - BP 95/70   Objective:   Weight Range: (S) 78.3 kg Body mass index is 21.01 kg/m.   Vital Signs:   Temp:  [97 F (36.1 C)-98.2 F (36.8 C)] 97.7 F (36.5 C) (03/06 0830) Pulse Rate:  [0-121] 63 (03/06 0830) Resp:  [9-23] 12 (03/06 0830) BP: (100)/(75) 100/75 (03/06 0325) SpO2:  [87 %-100 %] 95 % (03/06 0830) Arterial Line BP: (62-125)/(51-83) 115/70 (03/06 0830) FiO2 (%):  [50 %-100 %] 80 % (03/06 0755) Weight:  [78.3 kg] 78.3 kg (03/06 0500) Last BM Date : 07/16/23  Weight change: Filed Weights   07/15/23 0500 07/16/23 0500 07/17/23 0500  Weight: 85.9 kg 80.1 kg (S) 78.3 kg    Intake/Output:   Intake/Output Summary (Last 24 hours) at 07/17/2023 0834 Last data filed at 07/17/2023 0700 Gross per 24 hour  Intake 3168.35 ml  Output 5479.4 ml  Net -2311.05 ml      Physical Exam    General: sedated/intubated Lungs: mechanical lung sounds CV: bradycardic; lines well sutured with no hematoma, erythema or bleeding. Femoral CP & ECMO cannulas sutured into place; bleeding at arterial cannula site; mild oozing. No hematoma; impella 5.5 axillary site without bleeding Abdomen: soft Neurologic: sedated   Telemetry   NSR 60s-70s Labs    CBC Recent Labs    07/17/23 0045 07/17/23 0104 07/17/23 0636 07/17/23 0639 07/17/23 0824  WBC 19.0*  --  18.1*  --   --   HGB 7.8*   < > 8.6* 9.2* 9.2*  HCT 24.4*   < > 26.7* 27.0* 27.0*  MCV 85.9  --  85.9  --   --   PLT 88*  --  97*  --   --    < > =  values in this interval not displayed.   Basic Metabolic Panel Recent Labs    27/25/36 0403 07/16/23 0411 07/16/23 1636 07/16/23 1644 07/17/23 0115 07/17/23 0247 07/17/23 0639 07/17/23 0824  NA 136   < > 138   < > 135  137   < > 138 138  K 5.3*   < > 5.2*   < > 4.6  4.6   < > 4.8 4.8  CL 100   < > 103  --  102  101  --   --   --   CO2 28   < > 25  --  20*  23  --   --   --   GLUCOSE 130*   < > 147*  --  135*  131*  --   --   --   BUN 63*   < > 72*  --  67*  64*  --   --   --   CREATININE 3.61*   < > 4.23*  --  3.95*  3.81*  --   --   --   CALCIUM 8.1*   < > 7.7*  --  8.0*  8.0*  --   --   --   MG 2.5*  --   --   --  2.4  --   --   --   PHOS 3.4  --  5.5*  --  4.3  --   --   --    < > = values in this interval not displayed.   Liver Function Tests Recent Labs    07/16/23 0403 07/16/23 1636 07/17/23 0115  AST 197*  --  98*  ALT 90*  --  50*  ALKPHOS 91  --  79  BILITOT 2.2*  --  2.2*  PROT 6.2*  --  6.4*  ALBUMIN 2.4*  2.4* 2.2* 2.6*  2.6*    BNP: BNP (last 3 results) Recent Labs    07/09/23 1934 07/10/23 1224  BNP 703.7* 980.5*   Thyroid Function Tests Recent Labs    07/16/23 0403  TSH <0.010*     Medications:     Scheduled Medications:  sodium chloride   Intravenous Once   aspirin  81 mg Oral Daily   Chlorhexidine Gluconate Cloth  6 each Topical Daily   clonazePAM  1 mg Oral BID   docusate  100 mg Oral BID   feeding supplement (PROSource TF20)  60 mL Per Tube TID   fentaNYL (SUBLIMAZE) injection  50 mcg Intravenous Once   fiber supplement (BANATROL TF)  60 mL Per Tube BID   folic acid  1 mg Oral Daily   hydrocortisone sod succinate (SOLU-CORTEF) inj  25 mg Intravenous Q12H   insulin aspart  0-20 Units Subcutaneous Q4H   insulin aspart  5 Units Subcutaneous Q4H   methimazole  10 mg Oral Q8H   metoCLOPramide (REGLAN) injection  10 mg Intravenous Q8H   multivitamin  1 tablet Oral QHS   mouth rinse  15 mL Mouth Rinse Q2H   oxyCODONE  5  mg Oral Q6H   pantoprazole (PROTONIX) IV  40 mg Intravenous QHS   polyethylene glycol  17 g Oral Daily   QUEtiapine  50 mg Oral BID   sodium bicarbonate  50 mEq Intravenous Once   sodium chloride flush  3 mL Intravenous Q12H   thiamine  100 mg Oral Daily   Or   thiamine  100 mg Intravenous Daily    Infusions:  albumin human 999 mL/hr at 07/17/23 0700   bivalirudin (ANGIOMAX) 250 mg in sodium chloride 0.9 % 500 mL (0.5 mg/mL) infusion 0.027 mg/kg/hr (07/17/23 0823)   dexmedetomidine (PRECEDEX) IV infusion 1.2 mcg/kg/hr (07/17/23 0700)   feeding supplement (PIVOT 1.5 CAL) 65 mL/hr at 07/17/23 0700   HYDROmorphone 4 mg/hr (07/17/23 0700)   meropenem (MERREM) IV Stopped (07/17/23 0610)   midazolam 4 mg/hr (07/17/23 0700)   milrinone 0.25 mcg/kg/min (07/17/23 0810)   norepinephrine (LEVOPHED) Adult infusion Stopped (07/16/23 2358)   PrismaSol BGK 2/3.5 500 mL/hr at 07/17/23 0035   prismasol BGK 4/2.5 1,500 mL/hr at 07/17/23 0701   prismasol BGK 4/2.5 340 mL/hr at 07/16/23 1857   sodium bicarbonate 25 mEq (Impella PURGE) in dextrose 5 % 1000 mL bag 5.8 mL/hr at 07/13/23 0248   vancomycin     vasopressin 0.03 Units/min (07/17/23 0700)    PRN Medications: acetaminophen **OR** acetaminophen, albumin human, heparin, HYDROmorphone, midazolam, mouth rinse    Patient Profile   Patient with a history of Grave's disease, severe primary mitral regurgitation who presented with chest pain and segmental PE. Subsequent progressed to SCAI Stage E shock requiring VA ECMO cannulation on 2/27.   Assessment/Plan   SCAI Stage E Cardiogenic shock - Suspect hypoxic respiratory failure driven with segmental PE on  top of severe MR, nodal blockage, and thyrotoxicosis - Down time ~ 20 minutes, good mental status confirmed post code - Lactic acid resolved; repeat this AM pending.  - Vanc/meropenem for antibiotic prophylaxis, suspect leukocytosis is reactive and in the setting of steroids; WBC ct now  plateau at 32K.  - Impella 5.5 placed yesterday; bleeding from axillary pocket has now resolved. Restarting bivalirudin - We performed a mini-turn down on 07/15/23 at bedside; ECMO flows were reduced in a stepwise pattern by 0.5LPM to a goal of 2.5LPM. During this time, impella 5.5 flows increased to maintain net even flows. With reduction of ECMO flows, patient had onset of frequent PVCs/NSVT requiring amio boluses. In addition, despite increased LV unloading patient continued to have severe 4+ MR with progressive RV failure on bedside TTE.  - Underwent successful TEER on 07/16/23 with placement of x3 clips and improvement in MR from severe to mild. Mean mitral gradient of 3-34mmHg.  - Negative 2.5L over the past 24h; continue pulling net negative at 200cc/hr - Patient now on milrinone 0.23mcg/kg/min + levophed 5-36mcg with hope to wean off ECMO. His right radial PO2 drops to 55 with increased inotropy despite clear lungs. PA pressures now up to 80s/30s suggestive of underlying group II (valvular) pulmonary hypertension. Starting inhaled nitric today. Will continue to wean ECMO flows through today.   Severe mitral valve regurgitation - Noted on previous echocardiogram - Suspect contributing to ongoing shock - ECPella for management currently - Now s/p TEER on 07/16/23; improvement in MR from severe to mild.   Pulmonary hypertension - PA pressures now 80s/30s with PCWP of ~10.  - TPG of ~46mmHg suggestive of PVR >5 - Starting inhaled nitric - Marker of poor prognosis  Anemia - Drop in hgb overnight; transfusing 1U PRBC - LDH improving.   Acute renal failure/hyperkalemia - Appreciate nephrology consult - CRRT set up through ECMO circuit - see above  Thyrotoxicosis - Was not taking medications as they made him feel poorly - Suspect underlying low output heart failure due to mitral valve disease - Methimazole decreased to 10mg  TID - TSH below detectable limit; T4 0.54 07/14/23,  - Repeat T4 of  0.43; discussed with pharmD.   Atrial fibrillation - Present on arrival, in the setting of PE and thyroid disease - sinus rhythm today.   Pulmonary embolism:  - Segmental without right heart strain - Bivalrudin as above  CAD - Prior embolic infarct in the LAD territory with corresponding LGE on CMR - Lifelong anticoagulation  Sedation - Fentanyl, precedex - Versed as needed - Spot EEG without evidence of seizure - Notably minimal alcohol use after discussion with fiance.   Medication concerns reviewed with patient and pharmacy team. Barriers identified: antibiotic dosing, CRRT  Length of Stay: 8  Aaron Lint, DO  07/17/2023, 8:34 AM  Advanced Heart Failure Team Pager [phone] (M-F; 7a - 5p)  Please contact [facility] for night-coverage after hours (5p -7a ) and weekends on [email or web address]  CRITICAL CARE Performed by: Dorthula Nettles   Total critical care time: 65 minutes  Critical care time was exclusive of separately billable procedures and treating other patients.  Critical care was necessary to treat or prevent imminent or life-threatening deterioration.  Critical care was time spent personally by me on the following activities: development of treatment plan with patient and/or surrogate as well as nursing, discussions with consultants, evaluation of patient's response to treatment, examination of patient, obtaining history from patient or surrogate, ordering and performing treatments and  interventions, ordering and review of laboratory studies, ordering and review of radiographic studies, pulse oximetry and re-evaluation of patient's condition.

## 2023-07-17 NOTE — Progress Notes (Signed)
 Due to frequent ectopy, Impella position assessed by ultrasound; noted to be 5.8-6cm deep. Impella pulled back ~1cm and turned counter clockwise towards the mitral valve apparatus and away from the IVS. Improvement in ventricular ectopy noted. Will continue to wean ECMO flows.   Donnella Morford 5:36 PM

## 2023-07-18 ENCOUNTER — Inpatient Hospital Stay (HOSPITAL_COMMUNITY)

## 2023-07-18 ENCOUNTER — Other Ambulatory Visit: Payer: Self-pay

## 2023-07-18 ENCOUNTER — Inpatient Hospital Stay (HOSPITAL_COMMUNITY): Admitting: Anesthesiology

## 2023-07-18 ENCOUNTER — Encounter (HOSPITAL_COMMUNITY)
Admission: EM | Disposition: A | Discharge status: Rehabilitation Facility | Payer: Self-pay | Source: Home / Self Care | Attending: Cardiology

## 2023-07-18 DIAGNOSIS — I2699 Other pulmonary embolism without acute cor pulmonale: Secondary | ICD-10-CM | POA: Diagnosis not present

## 2023-07-18 DIAGNOSIS — I509 Heart failure, unspecified: Secondary | ICD-10-CM

## 2023-07-18 DIAGNOSIS — N170 Acute kidney failure with tubular necrosis: Secondary | ICD-10-CM | POA: Diagnosis not present

## 2023-07-18 DIAGNOSIS — I110 Hypertensive heart disease with heart failure: Secondary | ICD-10-CM | POA: Diagnosis not present

## 2023-07-18 DIAGNOSIS — R918 Other nonspecific abnormal finding of lung field: Secondary | ICD-10-CM | POA: Diagnosis not present

## 2023-07-18 DIAGNOSIS — E875 Hyperkalemia: Secondary | ICD-10-CM | POA: Diagnosis not present

## 2023-07-18 DIAGNOSIS — Z9911 Dependence on respirator [ventilator] status: Secondary | ICD-10-CM | POA: Diagnosis not present

## 2023-07-18 DIAGNOSIS — Z9281 Personal history of extracorporeal membrane oxygenation (ECMO): Secondary | ICD-10-CM

## 2023-07-18 DIAGNOSIS — R079 Chest pain, unspecified: Secondary | ICD-10-CM | POA: Diagnosis not present

## 2023-07-18 DIAGNOSIS — Z4682 Encounter for fitting and adjustment of non-vascular catheter: Secondary | ICD-10-CM | POA: Diagnosis not present

## 2023-07-18 DIAGNOSIS — I4891 Unspecified atrial fibrillation: Secondary | ICD-10-CM | POA: Diagnosis not present

## 2023-07-18 DIAGNOSIS — I5021 Acute systolic (congestive) heart failure: Secondary | ICD-10-CM | POA: Diagnosis not present

## 2023-07-18 DIAGNOSIS — Z954 Presence of other heart-valve replacement: Secondary | ICD-10-CM

## 2023-07-18 DIAGNOSIS — Z7189 Other specified counseling: Secondary | ICD-10-CM | POA: Diagnosis not present

## 2023-07-18 DIAGNOSIS — I5032 Chronic diastolic (congestive) heart failure: Secondary | ICD-10-CM | POA: Diagnosis not present

## 2023-07-18 DIAGNOSIS — R0602 Shortness of breath: Secondary | ICD-10-CM | POA: Diagnosis not present

## 2023-07-18 DIAGNOSIS — I480 Paroxysmal atrial fibrillation: Secondary | ICD-10-CM | POA: Diagnosis not present

## 2023-07-18 DIAGNOSIS — E0590 Thyrotoxicosis, unspecified without thyrotoxic crisis or storm: Secondary | ICD-10-CM | POA: Diagnosis not present

## 2023-07-18 DIAGNOSIS — R570 Cardiogenic shock: Secondary | ICD-10-CM | POA: Diagnosis not present

## 2023-07-18 DIAGNOSIS — E872 Acidosis, unspecified: Secondary | ICD-10-CM | POA: Diagnosis not present

## 2023-07-18 DIAGNOSIS — I469 Cardiac arrest, cause unspecified: Secondary | ICD-10-CM | POA: Diagnosis not present

## 2023-07-18 DIAGNOSIS — N179 Acute kidney failure, unspecified: Secondary | ICD-10-CM | POA: Diagnosis not present

## 2023-07-18 DIAGNOSIS — D638 Anemia in other chronic diseases classified elsewhere: Secondary | ICD-10-CM | POA: Diagnosis not present

## 2023-07-18 DIAGNOSIS — I340 Nonrheumatic mitral (valve) insufficiency: Secondary | ICD-10-CM | POA: Diagnosis not present

## 2023-07-18 DIAGNOSIS — Z452 Encounter for adjustment and management of vascular access device: Secondary | ICD-10-CM | POA: Diagnosis not present

## 2023-07-18 DIAGNOSIS — Z515 Encounter for palliative care: Secondary | ICD-10-CM | POA: Diagnosis not present

## 2023-07-18 DIAGNOSIS — R000 Tachycardia, unspecified: Secondary | ICD-10-CM | POA: Diagnosis not present

## 2023-07-18 DIAGNOSIS — I517 Cardiomegaly: Secondary | ICD-10-CM | POA: Diagnosis not present

## 2023-07-18 DIAGNOSIS — J9601 Acute respiratory failure with hypoxia: Secondary | ICD-10-CM | POA: Diagnosis not present

## 2023-07-18 HISTORY — PX: EMBOLECTOMY: SHX44

## 2023-07-18 LAB — PREPARE RBC (CROSSMATCH)

## 2023-07-18 LAB — POCT I-STAT 7, (LYTES, BLD GAS, ICA,H+H)
Acid-Base Excess: 1 mmol/L (ref 0.0–2.0)
Acid-Base Excess: 0 mmol/L (ref 0.0–2.0)
Acid-Base Excess: 0 mmol/L (ref 0.0–2.0)
Acid-Base Excess: 0 mmol/L (ref 0.0–2.0)
Acid-base deficit: 1 mmol/L (ref 0.0–2.0)
Acid-base deficit: 1 mmol/L (ref 0.0–2.0)
Acid-base deficit: 1 mmol/L (ref 0.0–2.0)
Acid-base deficit: 1 mmol/L (ref 0.0–2.0)
Acid-base deficit: 2 mmol/L (ref 0.0–2.0)
Bicarbonate: 23.7 mmol/L (ref 20.0–28.0)
Bicarbonate: 23.9 mmol/L (ref 20.0–28.0)
Bicarbonate: 23.9 mmol/L (ref 20.0–28.0)
Bicarbonate: 24.2 mmol/L (ref 20.0–28.0)
Bicarbonate: 24.3 mmol/L (ref 20.0–28.0)
Bicarbonate: 24.4 mmol/L (ref 20.0–28.0)
Bicarbonate: 24.5 mmol/L (ref 20.0–28.0)
Bicarbonate: 24.8 mmol/L (ref 20.0–28.0)
Bicarbonate: 26.5 mmol/L (ref 20.0–28.0)
Calcium, Ion: 1.06 mmol/L — ABNORMAL LOW (ref 1.15–1.40)
Calcium, Ion: 1.08 mmol/L — ABNORMAL LOW (ref 1.15–1.40)
Calcium, Ion: 1.08 mmol/L — ABNORMAL LOW (ref 1.15–1.40)
Calcium, Ion: 1.09 mmol/L — ABNORMAL LOW (ref 1.15–1.40)
Calcium, Ion: 1.11 mmol/L — ABNORMAL LOW (ref 1.15–1.40)
Calcium, Ion: 1.12 mmol/L — ABNORMAL LOW (ref 1.15–1.40)
Calcium, Ion: 1.12 mmol/L — ABNORMAL LOW (ref 1.15–1.40)
Calcium, Ion: 1.13 mmol/L — ABNORMAL LOW (ref 1.15–1.40)
Calcium, Ion: 1.13 mmol/L — ABNORMAL LOW (ref 1.15–1.40)
HCT: 22 % — ABNORMAL LOW (ref 39.0–52.0)
HCT: 22 % — ABNORMAL LOW (ref 39.0–52.0)
HCT: 23 % — ABNORMAL LOW (ref 39.0–52.0)
HCT: 24 % — ABNORMAL LOW (ref 39.0–52.0)
HCT: 25 % — ABNORMAL LOW (ref 39.0–52.0)
HCT: 25 % — ABNORMAL LOW (ref 39.0–52.0)
HCT: 27 % — ABNORMAL LOW (ref 39.0–52.0)
HCT: 27 % — ABNORMAL LOW (ref 39.0–52.0)
HCT: 28 % — ABNORMAL LOW (ref 39.0–52.0)
Hemoglobin: 7.5 g/dL — ABNORMAL LOW (ref 13.0–17.0)
Hemoglobin: 7.5 g/dL — ABNORMAL LOW (ref 13.0–17.0)
Hemoglobin: 7.8 g/dL — ABNORMAL LOW (ref 13.0–17.0)
Hemoglobin: 8.2 g/dL — ABNORMAL LOW (ref 13.0–17.0)
Hemoglobin: 8.5 g/dL — ABNORMAL LOW (ref 13.0–17.0)
Hemoglobin: 8.5 g/dL — ABNORMAL LOW (ref 13.0–17.0)
Hemoglobin: 9.2 g/dL — ABNORMAL LOW (ref 13.0–17.0)
Hemoglobin: 9.2 g/dL — ABNORMAL LOW (ref 13.0–17.0)
Hemoglobin: 9.5 g/dL — ABNORMAL LOW (ref 13.0–17.0)
O2 Saturation: 100 %
O2 Saturation: 100 %
O2 Saturation: 100 %
O2 Saturation: 93 %
O2 Saturation: 94 %
O2 Saturation: 95 %
O2 Saturation: 95 %
O2 Saturation: 98 %
O2 Saturation: 99 %
Patient temperature: 36.5
Patient temperature: 36.5
Patient temperature: 36.5
Patient temperature: 37.2
Patient temperature: 37.3
Patient temperature: 37.5
Patient temperature: 37.6
Potassium: 3.7 mmol/L (ref 3.5–5.1)
Potassium: 3.8 mmol/L (ref 3.5–5.1)
Potassium: 3.8 mmol/L (ref 3.5–5.1)
Potassium: 4 mmol/L (ref 3.5–5.1)
Potassium: 4.1 mmol/L (ref 3.5–5.1)
Potassium: 4.1 mmol/L (ref 3.5–5.1)
Potassium: 4.2 mmol/L (ref 3.5–5.1)
Potassium: 4.2 mmol/L (ref 3.5–5.1)
Potassium: 4.9 mmol/L (ref 3.5–5.1)
Sodium: 136 mmol/L (ref 135–145)
Sodium: 137 mmol/L (ref 135–145)
Sodium: 137 mmol/L (ref 135–145)
Sodium: 137 mmol/L (ref 135–145)
Sodium: 137 mmol/L (ref 135–145)
Sodium: 137 mmol/L (ref 135–145)
Sodium: 138 mmol/L (ref 135–145)
Sodium: 138 mmol/L (ref 135–145)
Sodium: 138 mmol/L (ref 135–145)
TCO2: 25 mmol/L (ref 22–32)
TCO2: 25 mmol/L (ref 22–32)
TCO2: 25 mmol/L (ref 22–32)
TCO2: 25 mmol/L (ref 22–32)
TCO2: 25 mmol/L (ref 22–32)
TCO2: 26 mmol/L (ref 22–32)
TCO2: 26 mmol/L (ref 22–32)
TCO2: 26 mmol/L (ref 22–32)
TCO2: 28 mmol/L (ref 22–32)
pCO2 arterial: 35.5 mmHg (ref 32–48)
pCO2 arterial: 36.1 mmHg (ref 32–48)
pCO2 arterial: 36.8 mmHg (ref 32–48)
pCO2 arterial: 37 mmHg (ref 32–48)
pCO2 arterial: 38.7 mmHg (ref 32–48)
pCO2 arterial: 43.1 mmHg (ref 32–48)
pCO2 arterial: 43.7 mmHg (ref 32–48)
pCO2 arterial: 46 mmHg (ref 32–48)
pCO2 arterial: 49.9 mmHg — ABNORMAL HIGH (ref 32–48)
pH, Arterial: 7.296 — ABNORMAL LOW (ref 7.35–7.45)
pH, Arterial: 7.341 — ABNORMAL LOW (ref 7.35–7.45)
pH, Arterial: 7.355 (ref 7.35–7.45)
pH, Arterial: 7.396 (ref 7.35–7.45)
pH, Arterial: 7.407 (ref 7.35–7.45)
pH, Arterial: 7.417 (ref 7.35–7.45)
pH, Arterial: 7.418 (ref 7.35–7.45)
pH, Arterial: 7.436 (ref 7.35–7.45)
pH, Arterial: 7.437 (ref 7.35–7.45)
pO2, Arterial: 107 mmHg (ref 83–108)
pO2, Arterial: 129 mmHg — ABNORMAL HIGH (ref 83–108)
pO2, Arterial: 229 mmHg — ABNORMAL HIGH (ref 83–108)
pO2, Arterial: 256 mmHg — ABNORMAL HIGH (ref 83–108)
pO2, Arterial: 370 mmHg — ABNORMAL HIGH (ref 83–108)
pO2, Arterial: 67 mmHg — ABNORMAL LOW (ref 83–108)
pO2, Arterial: 68 mmHg — ABNORMAL LOW (ref 83–108)
pO2, Arterial: 77 mmHg — ABNORMAL LOW (ref 83–108)
pO2, Arterial: 77 mmHg — ABNORMAL LOW (ref 83–108)

## 2023-07-18 LAB — FIBRINOGEN: Fibrinogen: 620 mg/dL — ABNORMAL HIGH (ref 210–475)

## 2023-07-18 LAB — HEPATIC FUNCTION PANEL
ALT: 25 U/L (ref 0–44)
AST: 48 U/L — ABNORMAL HIGH (ref 15–41)
Albumin: 2.9 g/dL — ABNORMAL LOW (ref 3.5–5.0)
Alkaline Phosphatase: 84 U/L (ref 38–126)
Bilirubin, Direct: 0.4 mg/dL — ABNORMAL HIGH (ref 0.0–0.2)
Indirect Bilirubin: 1.2 mg/dL — ABNORMAL HIGH (ref 0.3–0.9)
Total Bilirubin: 1.6 mg/dL — ABNORMAL HIGH (ref 0.0–1.2)
Total Protein: 6.6 g/dL (ref 6.5–8.1)

## 2023-07-18 LAB — ECHOCARDIOGRAM LIMITED
Est EF: 25
Height: 76 in
Weight: 2790.14 [oz_av]

## 2023-07-18 LAB — CBC
HCT: 22.7 % — ABNORMAL LOW (ref 39.0–52.0)
HCT: 22.7 % — ABNORMAL LOW (ref 39.0–52.0)
HCT: 27.2 % — ABNORMAL LOW (ref 39.0–52.0)
Hemoglobin: 7.4 g/dL — ABNORMAL LOW (ref 13.0–17.0)
Hemoglobin: 7.7 g/dL — ABNORMAL LOW (ref 13.0–17.0)
Hemoglobin: 9 g/dL — ABNORMAL LOW (ref 13.0–17.0)
MCH: 28.1 pg (ref 26.0–34.0)
MCH: 28.7 pg (ref 26.0–34.0)
MCH: 29.1 pg (ref 26.0–34.0)
MCHC: 32.6 g/dL (ref 30.0–36.0)
MCHC: 33.1 g/dL (ref 30.0–36.0)
MCHC: 33.9 g/dL (ref 30.0–36.0)
MCV: 85.7 fL (ref 80.0–100.0)
MCV: 86.3 fL (ref 80.0–100.0)
MCV: 86.6 fL (ref 80.0–100.0)
Platelets: 118 10*3/uL — ABNORMAL LOW (ref 150–400)
Platelets: 119 10*3/uL — ABNORMAL LOW (ref 150–400)
Platelets: 168 10*3/uL (ref 150–400)
RBC: 2.63 MIL/uL — ABNORMAL LOW (ref 4.22–5.81)
RBC: 2.65 MIL/uL — ABNORMAL LOW (ref 4.22–5.81)
RBC: 3.14 MIL/uL — ABNORMAL LOW (ref 4.22–5.81)
RDW: 21 % — ABNORMAL HIGH (ref 11.5–15.5)
RDW: 21.4 % — ABNORMAL HIGH (ref 11.5–15.5)
RDW: 21.5 % — ABNORMAL HIGH (ref 11.5–15.5)
WBC: 19.9 10*3/uL — ABNORMAL HIGH (ref 4.0–10.5)
WBC: 23.2 10*3/uL — ABNORMAL HIGH (ref 4.0–10.5)
WBC: 26.1 10*3/uL — ABNORMAL HIGH (ref 4.0–10.5)
nRBC: 0 % (ref 0.0–0.2)
nRBC: 0.1 % (ref 0.0–0.2)
nRBC: 0.1 % (ref 0.0–0.2)

## 2023-07-18 LAB — APTT
aPTT: 75 s — ABNORMAL HIGH (ref 24–36)
aPTT: 74 s — ABNORMAL HIGH (ref 24–36)
aPTT: 67 s — ABNORMAL HIGH (ref 24–36)

## 2023-07-18 LAB — RENAL FUNCTION PANEL
Albumin: 2.9 g/dL — ABNORMAL LOW (ref 3.5–5.0)
Albumin: 3.3 g/dL — ABNORMAL LOW (ref 3.5–5.0)
Anion gap: 12 (ref 5–15)
Anion gap: 14 (ref 5–15)
BUN: 68 mg/dL — ABNORMAL HIGH (ref 6–20)
BUN: 66 mg/dL — ABNORMAL HIGH (ref 6–20)
CO2: 24 mmol/L (ref 22–32)
CO2: 21 mmol/L — ABNORMAL LOW (ref 22–32)
Calcium: 8.3 mg/dL — ABNORMAL LOW (ref 8.9–10.3)
Calcium: 8.4 mg/dL — ABNORMAL LOW (ref 8.9–10.3)
Chloride: 101 mmol/L (ref 98–111)
Chloride: 98 mmol/L (ref 98–111)
Creatinine, Ser: 3.64 mg/dL — ABNORMAL HIGH (ref 0.61–1.24)
Creatinine, Ser: 3.85 mg/dL — ABNORMAL HIGH (ref 0.61–1.24)
GFR, Estimated: 21 mL/min — ABNORMAL LOW (ref >60)
GFR, Estimated: 20 mL/min — ABNORMAL LOW (ref >60)
Glucose, Bld: 142 mg/dL — ABNORMAL HIGH (ref 70–99)
Glucose, Bld: 159 mg/dL — ABNORMAL HIGH (ref 70–99)
Phosphorus: 2.4 mg/dL — ABNORMAL LOW (ref 2.5–4.6)
Phosphorus: 2.7 mg/dL (ref 2.5–4.6)
Potassium: 4.1 mmol/L (ref 3.5–5.1)
Potassium: 4.1 mmol/L (ref 3.5–5.1)
Sodium: 137 mmol/L (ref 135–145)
Sodium: 133 mmol/L — ABNORMAL LOW (ref 135–145)

## 2023-07-18 LAB — CG4 I-STAT (LACTIC ACID)
Lactic Acid, Venous: 0.8 mmol/L (ref 0.5–1.9)
Lactic Acid, Venous: 0.8 mmol/L (ref 0.5–1.9)
Lactic Acid, Venous: 0.6 mmol/L (ref 0.5–1.9)

## 2023-07-18 LAB — POCT I-STAT, CHEM 8
BUN: 64 mg/dL — ABNORMAL HIGH (ref 6–20)
Calcium, Ion: 1.14 mmol/L — ABNORMAL LOW (ref 1.15–1.40)
Chloride: 101 mmol/L (ref 98–111)
Creatinine, Ser: 4 mg/dL — ABNORMAL HIGH (ref 0.61–1.24)
Glucose, Bld: 187 mg/dL — ABNORMAL HIGH (ref 70–99)
HCT: 27 % — ABNORMAL LOW (ref 39.0–52.0)
Hemoglobin: 9.2 g/dL — ABNORMAL LOW (ref 13.0–17.0)
Potassium: 3.8 mmol/L (ref 3.5–5.1)
Sodium: 136 mmol/L (ref 135–145)
TCO2: 23 mmol/L (ref 22–32)

## 2023-07-18 LAB — BASIC METABOLIC PANEL
Anion gap: 12 (ref 5–15)
BUN: 68 mg/dL — ABNORMAL HIGH (ref 6–20)
CO2: 24 mmol/L (ref 22–32)
Calcium: 8.3 mg/dL — ABNORMAL LOW (ref 8.9–10.3)
Chloride: 102 mmol/L (ref 98–111)
Creatinine, Ser: 3.59 mg/dL — ABNORMAL HIGH (ref 0.61–1.24)
GFR, Estimated: 21 mL/min — ABNORMAL LOW (ref >60)
Glucose, Bld: 145 mg/dL — ABNORMAL HIGH (ref 70–99)
Potassium: 4.2 mmol/L (ref 3.5–5.1)
Sodium: 138 mmol/L (ref 135–145)

## 2023-07-18 LAB — GLUCOSE, CAPILLARY
Glucose-Capillary: 116 mg/dL — ABNORMAL HIGH (ref 70–99)
Glucose-Capillary: 132 mg/dL — ABNORMAL HIGH (ref 70–99)
Glucose-Capillary: 137 mg/dL — ABNORMAL HIGH (ref 70–99)
Glucose-Capillary: 162 mg/dL — ABNORMAL HIGH (ref 70–99)
Glucose-Capillary: 175 mg/dL — ABNORMAL HIGH (ref 70–99)
Glucose-Capillary: 200 mg/dL — ABNORMAL HIGH (ref 70–99)

## 2023-07-18 LAB — POCT ACTIVATED CLOTTING TIME
Activated Clotting Time: 176 s
Activated Clotting Time: 199 s
Activated Clotting Time: 199 s
Activated Clotting Time: 228 s

## 2023-07-18 LAB — MAGNESIUM: Magnesium: 2.8 mg/dL — ABNORMAL HIGH (ref 1.7–2.4)

## 2023-07-18 LAB — PROTIME-INR
INR: 1.4 — ABNORMAL HIGH (ref 0.8–1.2)
Prothrombin Time: 17 s — ABNORMAL HIGH (ref 11.4–15.2)

## 2023-07-18 LAB — LACTATE DEHYDROGENASE: LDH: 1493 U/L — ABNORMAL HIGH (ref 98–192)

## 2023-07-18 SURGERY — DECANNULATION FOR  ECMO (EXTRACORPOREAL MEMBRANE OXYGENATION)
Anesthesia: General | Site: Groin

## 2023-07-18 MED ORDER — HEMOSTATIC AGENTS (NO CHARGE) OPTIME
TOPICAL | Status: DC | PRN
  Administered 2023-07-18: 1 via TOPICAL

## 2023-07-18 MED ORDER — PHENYLEPHRINE 80 MCG/ML (10ML) SYRINGE FOR IV PUSH (FOR BLOOD PRESSURE SUPPORT)
PREFILLED_SYRINGE | INTRAVENOUS | Status: DC | PRN
  Administered 2023-07-18 (×3): 80 ug via INTRAVENOUS

## 2023-07-18 MED ORDER — PROPOFOL 500 MG/50ML IV EMUL
INTRAVENOUS | Status: DC | PRN
Start: 2023-07-18 — End: 2023-07-18
  Administered 2023-07-18: 65 ug/kg/min via INTRAVENOUS

## 2023-07-18 MED ORDER — PROPOFOL 10 MG/ML IV BOLUS
INTRAVENOUS | Status: DC | PRN
Start: 2023-07-18 — End: 2023-07-18
  Administered 2023-07-18: 50 mg via INTRAVENOUS

## 2023-07-18 MED ORDER — HEPARIN 6000 UNIT IRRIGATION SOLUTION
Status: AC
  Filled 2023-07-18: qty 500

## 2023-07-18 MED ORDER — HEPARIN 6000 UNIT IRRIGATION SOLUTION
Status: DC | PRN
  Administered 2023-07-18: 1

## 2023-07-18 MED ORDER — DEXMEDETOMIDINE HCL IN NACL 400 MCG/100ML IV SOLN
0.0000 ug/kg/h | INTRAVENOUS | Status: DC
  Administered 2023-07-18 – 2023-07-22 (×8): 0.4 ug/kg/h via INTRAVENOUS
  Administered 2023-07-23: 0.5 ug/kg/h via INTRAVENOUS
  Administered 2023-07-23: 0.6 ug/kg/h via INTRAVENOUS
  Administered 2023-07-24: 0.3 ug/kg/h via INTRAVENOUS
  Administered 2023-07-24 – 2023-07-25 (×2): 0.4 ug/kg/h via INTRAVENOUS
  Administered 2023-07-25 (×2): 0.8 ug/kg/h via INTRAVENOUS
  Administered 2023-07-26: 0.2 ug/kg/h via INTRAVENOUS
  Administered 2023-07-27 – 2023-07-28 (×3): 0.4 ug/kg/h via INTRAVENOUS
  Filled 2023-07-18: qty 100
  Filled 2023-07-18: qty 200
  Filled 2023-07-18 (×17): qty 100

## 2023-07-18 MED ORDER — ROCURONIUM BROMIDE 10 MG/ML (PF) SYRINGE
PREFILLED_SYRINGE | INTRAVENOUS | Status: DC | PRN
  Administered 2023-07-18: 30 mg via INTRAVENOUS
  Administered 2023-07-18: 50 mg via INTRAVENOUS

## 2023-07-18 MED ORDER — HEPARIN (PORCINE) 25000 UT/250ML-% IV SOLN
800.0000 [IU]/h | INTRAVENOUS | Status: DC
  Administered 2023-07-18: 500 [IU]/h via INTRAVENOUS
  Filled 2023-07-18: qty 250

## 2023-07-18 MED ORDER — SODIUM CHLORIDE 0.9 % IV SOLN
INTRAVENOUS | Status: DC | PRN
Start: 2023-07-18 — End: 2023-07-18

## 2023-07-18 MED ORDER — HEPARIN SODIUM (PORCINE) 1000 UNIT/ML IJ SOLN
INTRAMUSCULAR | Status: DC | PRN
  Administered 2023-07-18: 5000 [IU] via INTRAVENOUS
  Administered 2023-07-18: 3000 [IU] via INTRAVENOUS

## 2023-07-18 MED ORDER — SODIUM CHLORIDE 0.9% IV SOLUTION: Freq: Once | INTRAVENOUS | Status: AC

## 2023-07-18 MED ORDER — MILRINONE LACTATE IN DEXTROSE 20-5 MG/100ML-% IV SOLN
0.2500 ug/kg/min | INTRAVENOUS | Status: DC
  Administered 2023-07-18 – 2023-07-20 (×3): 0.25 ug/kg/min via INTRAVENOUS
  Filled 2023-07-18 (×6): qty 100

## 2023-07-18 MED ORDER — 0.9 % SODIUM CHLORIDE (POUR BTL) OPTIME
TOPICAL | Status: DC | PRN
  Administered 2023-07-18: 2000 mL

## 2023-07-18 MED ORDER — HEPARIN SODIUM (PORCINE) 1000 UNIT/ML IJ SOLN
INTRAMUSCULAR | Status: AC
Start: 2023-07-18 — End: 2023-07-18
  Filled 2023-07-18: qty 4

## 2023-07-18 MED ORDER — HEPARIN SODIUM (PORCINE) 1000 UNIT/ML IJ SOLN
1000.0000 [IU] | Freq: Once | INTRAMUSCULAR | Status: AC
  Administered 2023-07-18: 1000 [IU] via INTRAVENOUS

## 2023-07-18 MED ORDER — HEPARIN SODIUM (PORCINE) 1000 UNIT/ML IJ SOLN
3000.0000 [IU] | Freq: Once | INTRAMUSCULAR | Status: AC
  Administered 2023-07-18: 3000 [IU] via INTRAVENOUS

## 2023-07-18 MED ORDER — SODIUM CHLORIDE 0.9 % IV SOLN
0.0200 mg/kg/h | INTRAVENOUS | Status: DC
  Administered 2023-07-18: 0.024 mg/kg/h via INTRAVENOUS
  Administered 2023-07-18: 0.02 mg/kg/h via INTRAVENOUS
  Filled 2023-07-18: qty 250

## 2023-07-18 MED ORDER — SURGIFLO WITH THROMBIN (HEMOSTATIC MATRIX KIT) OPTIME
TOPICAL | Status: DC | PRN
  Administered 2023-07-18: 1 via TOPICAL

## 2023-07-18 SURGICAL SUPPLY — 37 items
APPLIER CLIP 9.375 SM OPEN (CLIP) IMPLANT
CATH EMB 4FR 80 (CATHETERS) IMPLANT
CLAMP SUTURE YELLOW 5 PAIRS (MISCELLANEOUS) IMPLANT
CLIP APPLIE 9.375 SM OPEN (CLIP) ×2 IMPLANT
CLIP TI MEDIUM 24 (CLIP) ×2 IMPLANT
CLIP TI MEDIUM 6 (CLIP) ×2 IMPLANT
CLIP TI WIDE RED SMALL 24 (CLIP) ×2 IMPLANT
CLIP TI WIDE RED SMALL 6 (CLIP) ×2 IMPLANT
DRAPE INCISE IOBAN 85X60 (DRAPES) IMPLANT
DRESSING PEEL AND PLC PRVNA 13 (GAUZE/BANDAGES/DRESSINGS) IMPLANT
DRSG PEEL AND PLACE PREVENA 13 (GAUZE/BANDAGES/DRESSINGS) ×2 IMPLANT
DRSG TEGADERM 2-3/8X2-3/4 SM (GAUZE/BANDAGES/DRESSINGS) IMPLANT
GAUZE SPONGE 2X2 STRL 8-PLY (GAUZE/BANDAGES/DRESSINGS) IMPLANT
GLOVE BIOGEL PI IND STRL 8 (GLOVE) ×2 IMPLANT
GOWN STRL NON-REIN LRG LVL3 (GOWN DISPOSABLE) IMPLANT
GOWN STRL REUS W/TWL 2XL LVL3 (GOWN DISPOSABLE) ×2 IMPLANT
GOWN STRL REUS W/TWL XL LVL3 (GOWN DISPOSABLE) IMPLANT
HEMOSTAT SNOW SURGICEL 2X4 (HEMOSTASIS) IMPLANT
KIT BASIN OR (CUSTOM PROCEDURE TRAY) ×2 IMPLANT
KIT DRSG PREVENA PLUS 7DAY 125 (MISCELLANEOUS) IMPLANT
LOOP VESSEL MINI RED (MISCELLANEOUS) IMPLANT
NS IRRIG 1000ML POUR BTL (IV SOLUTION) ×4 IMPLANT
PACK PERIPHERAL VASCULAR (CUSTOM PROCEDURE TRAY) ×2 IMPLANT
STAPLER SKIN PROX 35W (STAPLE) IMPLANT
STAPLER VISISTAT (STAPLE) IMPLANT
STOPCOCK 4 WAY LG BORE MALE ST (IV SETS) IMPLANT
SURGIFLO W/THROMBIN 8M KIT (HEMOSTASIS) IMPLANT
SUT MNCRL AB 3-0 PS2 18 (SUTURE) IMPLANT
SUT PROLENE 5 0 C1 (SUTURE) ×2 IMPLANT
SUT PROLENE 6 0 BV (SUTURE) ×2 IMPLANT
SUT SILK 1 MH (SUTURE) IMPLANT
SUT SILK 2 0 SH (SUTURE) IMPLANT
SUT VIC AB 2-0 CT1 TAPERPNT 27 (SUTURE) IMPLANT
SUT VIC AB 3-0 SH 27X BRD (SUTURE) IMPLANT
TAG SUTURE CLAMP YLW 5PR (MISCELLANEOUS) ×2 IMPLANT
TOWEL GREEN STERILE (TOWEL DISPOSABLE) ×2 IMPLANT
TOWEL GREEN STERILE FF (TOWEL DISPOSABLE) ×2 IMPLANT

## 2023-07-18 NOTE — Progress Notes (Signed)
 Patient waking up, tachycardic and hypertensive. Boluses given and drips increased per Merrily Pew MD at bedside.

## 2023-07-18 NOTE — Progress Notes (Signed)
 ECMO WEAN:   Bedside ECMO weaned performed today with ECLS team, echo tech at bedside & pharmD. Patient bolused heparin 4000U prior to weaning to maintain ACT > 200.   ECMO was weaned down to 1L and finally clamped in a stepwise manner on milrinone 0.137mcg/kg/min, levophed , nitric 30. Impella at P5.   ECMO flow: 1.5LPM, CVP 6, PA:45/15 , BP: 111/45 1.25LPM, CVP 6, PA 45/14, BP: 114/54 1LPM, CVP 7, PA 45/15, BP 114, 53   ECMO clamped for 1 minute, milrinone 0.7mcg/kg/min:  CVP 6, PA 44/13, BP 101/46, lactic acid 0.8, O2 sat 100% TTE w/ LVEF 30-35%, RV increasingly dilated with mild to moderately reduced function.   Will schedule decannulation.   Discussed with family.   CC TIME: 1 hour.   Aaron [name] 1:09 PM

## 2023-07-18 NOTE — Progress Notes (Signed)
 Daily Progress Note   Patient Name: Aaron [name]       Date: 07/18/2023 DOB: [birth year]  Age: 38 y.o. MRN#: [id number] Attending Physician: Romie Minus, MD Primary Care Physician: Patient, No Pcp Per Admit Date: 07/09/2023 Length of Stay: 9 days  Reason for Consultation/Follow-up: Establishing goals of care and Psychosocial/spiritual support  HPI/Patient Profile:  38 y.o. male  with past medical history of paroxysmal a-fib not adherent to Eliquis, chronic HFpEF, severe mitral regurgitation, and hyperthyroidism admitted on 07/09/2023 to [facility] with worsening shortness of breath, chest pain, and atrial fibrillation with RVR.    CTA showed segmental bilateral lower lobe pulmonary emboli and heparin started.   On 2/27 patient rapidly deteriorated developed V. tach/V-fib cardiac arrest. ROSC achieved after ~ 16 minutes of down time. Patient was intubated. Cardiology was consulted patient was placed on VA ECMO and was transferred to [facility].  Palliative medicine was consulted for GOC conversations, support of patient/family.  Subjective:   Subjective: Chart Reviewed. Updates received. Patient Assessed. Created space and opportunity for patient  and family to explore thoughts and feelings regarding current medical situation.  Today's Discussion: Today saw the patient at bedside, no family is present.  I extensively discussed the patient with bedside nursing and PCCM physician Dr. Merrily Pew.  The patient had some hiiccups overnight with ECMO flow, also some ectopy. This all resolved with medications and mechanical adjustments, doing better this morning. Plant to clamp ECMO today and see how he does. Family just left and will be back this afternoon. Nursing staff will message me when they get here.  Later in the day I came back to see if family is present, nurse states they will be back at 5:30. Shared that they clamped ECMO and the patient did really well, possible  decannulation tomorrow. Discussed with Dr. Merrily Pew who confirmed. I offered palliative checking in once every 3-7 days for ongoing support, will check back in Monday and he is ok with this.  I called the patient's fiancee and updated her. Offered support and ensured they have palliative contact information. I provided emotional and general support through therapeutic listening, empathy, sharing of stories, and other techniques. I answered all questions and addressed all concerns to the best of my ability.  Review of Systems  Unable to perform ROS: Intubated    Objective:   Vital Signs:  BP 100/75   Pulse 65   Temp 99.1 F (37.3 C)   Resp 18   Ht 6\' 4"  (1.93 m)   Wt 79.1 kg   SpO2 100%   BMI 21.23 kg/m   Physical Exam Vitals and nursing note reviewed.  Constitutional:      General: He is sleeping. He is not in acute distress.    Appearance: He is ill-appearing.     Interventions: He is sedated and intubated.  HENT:     Head: Normocephalic and atraumatic.  Cardiovascular:     Rate and Rhythm: Normal rate.  Pulmonary:     Effort: Pulmonary effort is normal. No respiratory distress. He is intubated.     Breath sounds: No wheezing or rhonchi.  Abdominal:     General: Abdomen is flat. Bowel sounds are normal. There is no distension.     Palpations: Abdomen is soft.  Skin:    General: Skin is warm and dry.     Palliative Assessment/Data: 10%    Existing Vynca/ACP Documentation: None  Assessment & Plan:   Impression: Present on Admission:  Bilateral pulmonary embolism (HCC)  Hyperthyroidism  Chronic heart failure with preserved ejection fraction (HFpEF) (HCC)  Severe mitral regurgitation  Paroxysmal atrial fibrillation (HCC)  SUMMARY OF RECOMMENDATIONS   Full code Full scope of care Anticipate possible ECMO decannulation tomorrow Ongoing support of patient and family Palliative medicine will follow-up early next week  Symptom Management:  Per primary  team PMT is available to assist as needed  Code Status: Full code  Prognosis: Unable to determine  Discharge Planning: To Be Determined  Discussed with: Patient's family, medical team, nursing team  Thank you for allowing [place] to participate in the care of Aaron [name] PMT will continue to support holistically.  Time Total: 66 min  Detailed review of medical records (labs, imaging, vital signs), medically appropriate exam, discussed with treatment team, counseling and education to patient, family, & staff, documenting clinical information, medication management, coordination of care  Wynne Dust, NP Palliative Medicine Team  Team Phone # [phone] (Nights/Weekends)  01/09/2021, 8:17 AM

## 2023-07-18 NOTE — Progress Notes (Addendum)
   07/17/23 2309  Cardiohelp  Flow (LPM) 2.44  Speed (RPMs) (S)  2780 (ECMO flows spontaneously dropped to below 2L/min, no chugging noted. no bleeding noted. 1 Albumin given no acute response to it. increase RPM's. patient is clinically stable. MAP's stable at . Will give another Albumin)    ECMO flows spontaneously dropped from 2.4/2.5L/min to below 2L/min (1.8-1.9L/min ) sustained without any significant chugging events or ECMO pressure changes. Mild chugging noted. Internal, arterial, and transmembrane ECMO pressures remain stable and essentially unchanged. Patient was clinically stable throughout this acute event. MAP and HR were stable.   Interventions: Concerning for circuit patency and low flows; 1 Albumin was given with marginal improvement of flows, flows still 1.8-2.0L/min.  RPM's/Flows were gradually walked up to > 2.3L/min of flow to meet systemic body demands/cardiac output while another albumin was given and after the subsequent albumin, ECMO flow went up to 3.5L/min. RPM's/ECMO flows were returned back to baseline without any complications. Flows are now appropriate at this time. CVP 8-67mmhg pre/post albumin administration. Will continue to monitor patient clinical presentation and flows/hemodynamics throughout the night.   Shawndra Clute L. Katrinka Blazing, BS, RRT-ACCS, RCP, CES-A

## 2023-07-18 NOTE — Op Note (Signed)
    NAME: Aaron [name]    MRN: [id number] DOB: 1986/01/12    DATE OF OPERATION: 07/18/2023  PREOP DIAGNOSIS:    Extracorporeal membrane oxygenation  POSTOP DIAGNOSIS:    Same  PROCEDURE:    Extracorporeal membrane oxygenation decannulation Left iliofemoral, femoral-popliteal embolectomy Left common femoral artery primary repair Left superficial femoral artery primary repair Vacuum-assisted dressing placement-Prevena  SURGEON: Victorino Sparrow  ASSIST: Emilie Rutter, PA  ANESTHESIA: General  EBL: 250 mL  INDICATIONS:    ALFONSA VAILE is a 38 y.o. male on extracorporeal membrane oxygenation support status post PE, PEA arrest.  He has improved dramatically, and the heart failure team is asked for decannulation.  After discussing the risks and benefits with his family, they elected to proceed.  FINDINGS:   Thrombus within the left iliac system, superficial femoral artery, popliteal artery Healthy common femoral artery without disease  TECHNIQUE:   Patient was brought to the OR laid in supine position.  General anesthesia was induced and the patient was prepped draped in sterile fashion.  The case began with clamping but the arterial and venous ECMO cannulas.  These were conjoined.  Next, I moved to the left groin.  An oblique incision was made incorporating both the arterial sheath as well as the reperfusion cannula.  This was carried down to the common femoral artery.  The distal external iliac artery, profunda, superficial femoral artery were all exposed, and controlled with the use of Vesseloops.  Next, the reperfusion cannula was clamped, and the arterial ECMO cannula removed.  Following this, there was clot appreciated in the base of the common femoral artery.  A #4 Fogarty embolectomy catheter was brought to the field and passed proximally with removal of a large fibrin sheath, as well as acute thrombus.  Distally, the #4 Fogarty was passed until it hubbed, and pulled back.   This was passed several times with the return of thrombus.  Passes were made until there was no thrombus return.  Next, I moved to remove the 6 [place] reperfusion cannula.  A pursestring suture was placed with a 6-0 Prolene, and tied.  Once hemostasis was achieved, I moved back to the groin.  Several 6-0 Prolene's were used in interrupted fashion to close the cannulation arteriotomy.  Prior to completion, the artery was backbled.  Upon completion, the patient had a palpable pulse in the foot.  I was very happy with this.  The wound bed was irrigated with copious amounts of saline.  Thrombin product Surgiflo was placed in the wound bed and the wound was closed in layers using 2-0 Vicryl suture with staples at the level of the skin.  A Prevena vacuum dressing was placed.  We then moved to the right groin and a 3-0 Monocryl suture was used to create a pursestring around the venous cannula.  The venous cannula was removed, and the pursestring tightened.  Manual pressure was held.  Impression: Successful decannulation of ECMO with iliofemoral popliteal embolectomy, primary repair of the common femoral artery, superficial femoral artery.  Palpable dorsalis pedis artery at case completion.  Plan will be for 500 units of heparin for the next 2 hours, and then return to therapeutic dose without bolus.  Victorino Sparrow, MD [facility] DATE OF DICTATION:   07/18/2023

## 2023-07-18 NOTE — Progress Notes (Signed)
 PHARMACY - ANTICOAGULATION CONSULT NOTE  Pharmacy Consult for bivalirudin Indication:  ECMO, Impella  No Known Allergies  Patient Measurements: Height: 6\' 4"  (193 cm) Weight: 79.1 kg (174 lb 6.1 oz) IBW/kg (Calculated) : 86.8 Heparin Dosing Weight: n/a  Vital Signs: Temp: 99.7 F (37.6 C) (03/07 1145) Temp Source: Core (03/07 0940) Pulse Rate: 67 (03/07 1145)  Labs: Recent Labs    07/16/23 0403 07/16/23 0411 07/17/23 0115 07/17/23 0247 07/17/23 1720 07/17/23 1730 07/18/23 0013 07/18/23 0351 07/18/23 0356 07/18/23 0737 07/18/23 1100  HGB 9.2*   < >  --    < > 8.8*   < > 7.4* 7.7* 7.5* 7.8* 8.5*  HCT 27.6*   < >  --    < > 26.8*   < > 22.7* 22.7* 22.0* 23.0* 25.0*  PLT 92*   < >  --    < > 132*  --  118* 119*  --   --   --   APTT 57*   < > 62*  --  73*  --   --  75*  --   --  74*  LABPROT 16.4*  --  16.3*  --   --   --   --  17.0*  --   --   --   INR 1.3*  --  1.3*  --   --   --   --  1.4*  --   --   --   CREATININE 3.61*   < > 3.95*  3.81*  --  3.64*  --   --  3.59*  3.64*  --   --   --    < > = values in this interval not displayed.    Estimated Creatinine Clearance: 31.2 mL/min (A) (by C-G formula based on SCr of 3.59 mg/dL (H)).   Medical History: Past Medical History:  Diagnosis Date   Atrial fibrillation (HCC)    Hyperthyroidism    Mitral regurgitation    NSTEMI (non-ST elevated myocardial infarction) [facility])     Assessment: 38 yo M with bilateral PE s/p TNK (2/27 @1156 ) now on [place] ECMO + Impella. Pharmacy consulted for bivalirudin for anticoagulation.   S/p impella CP >5.5 3/3 - bivalirudin was previously held with oozing at insertion site but restart 3/4. Patient underwent mitraclip 3/5.  aPTT this afternoon was 74, after rate reductions, now currently on bivalirudin@0 .024 mg/kg/hr. Remains on CRRT. Hgb 7.7, plt 119. LDH continues to trend down to 1493. Fibrinogen 620. Bleeding stabilized/improved at cannula site where access obtained for mitraclip. No  fibrin in ECMO circuit, impella flowing without issues. Underwent successful clamp trial and received 4000 units of heparin - will determine plan for decannulation.  Goal of Therapy:  aPTT 50-70 seconds Monitor platelets by anticoagulation protocol: Yes   Plan:  Decrease bivalirudin to 0.02 mg/kg/hr (using dosing weight of 78.3 kg) aPTT q12h  F/u CBC, LDH, fibrinogen Monitor s/s bleeding   Thank you for allowing pharmacy to participate in this patient's care,  Sherron Monday, PharmD, BCCCP Clinical Pharmacist  Phone: [phone] 07/18/2023 12:34 PM  Please check AMION for all [facility] Pharmacy phone numbers After 10:00 PM, call Main Pharmacy [phone]

## 2023-07-18 NOTE — Consult Note (Signed)
 Hospital Consult    Reason for Consult: ECMO decannulation Requesting Physician: Heart failure MRN #:  [id number]  History of Present Illness: This is a 38 y.o. male currently on [place] ECMO who was admitted with worsening short of breath on 2/26 with A-fib with RVR he decompensated with IVF and nodal blockade with subsequent arrest, ROSC, VA ECMO with Impella  Patient has been doing well.  Vascular surgery was called to decannulate.  On exam, Aaron [name] was intubated and sedated. Palpable pulse in the right foot, nonpalpable pulse in the left foot, left sided arterial cannulization as well as reperfusion catheter in place  Past Medical History:  Diagnosis Date   Atrial fibrillation [facility])    Hyperthyroidism    Mitral regurgitation    NSTEMI (non-ST elevated myocardial infarction) [facility])     Past Surgical History:  Procedure Laterality Date   ECMO CANNULATION N/A 07/10/2023   Procedure: ECMO CANNULATION;  Surgeon: Dolores Patty, MD;  Location: [facility] INVASIVE CV LAB;  Service: Cardiovascular;  Laterality: N/A;   LEFT HEART CATH AND CORONARY ANGIOGRAPHY N/A 10/22/2021   Procedure: LEFT HEART CATH AND CORONARY ANGIOGRAPHY;  Surgeon: Yvonne Kendall, MD;  Location: [facility] INVASIVE CV LAB;  Service: Cardiovascular;  Laterality: N/A;   PLACEMENT OF IMPELLA LEFT VENTRICULAR ASSIST DEVICE Right 07/14/2023   Procedure: PLACEMENT OF RIGHT AXILLARY IMPELLA 5.5 LEFT VENTRICULAR ASSIST DEVICE;  Surgeon: Loreli Slot, MD;  Location: [facility] OR;  Service: Open Heart Surgery;  Laterality: Right;   REMOVAL OF IMPELLA LEFT VENTRICULAR ASSIST DEVICE Right 07/14/2023   Procedure: REMOVAL OF CP RIGHT FEMORAL IMPELLA LEFT VENTRICULAR ASSIST DEVICE;  Surgeon: Loreli Slot, MD;  Location: [facility] OR;  Service: Open Heart Surgery;  Laterality: Right;   TRANSCATHETER MITRAL EDGE TO EDGE REPAIR N/A 07/16/2023   Procedure: TRANSCATHETER MITRAL EDGE TO EDGE REPAIR;  Surgeon: Tonny Bollman, MD;  Location: [facility] INVASIVE CV  LAB;  Service: Cardiovascular;  Laterality: N/A;   TRANSESOPHAGEAL ECHOCARDIOGRAM (CATH LAB) N/A 07/16/2023   Procedure: TRANSESOPHAGEAL ECHOCARDIOGRAM;  Surgeon: Tonny Bollman, MD;  Location: [facility] INVASIVE CV LAB;  Service: Cardiovascular;  Laterality: N/A;   VENTRICULAR ASSIST DEVICE INSERTION N/A 07/10/2023   Procedure: VENTRICULAR ASSIST DEVICE INSERTION;  Surgeon: Dolores Patty, MD;  Location: [facility] INVASIVE CV LAB;  Service: Cardiovascular;  Laterality: N/A;    No Known Allergies  Prior to Admission medications   Medication Sig Start Date End Date Taking? Authorizing Provider  bismuth subsalicylate (PEPTO BISMOL) 262 MG chewable tablet Chew 524 mg by mouth as needed for indigestion.   Yes [provider]  apixaban (ELIQUIS) 5 MG TABS tablet Take 1 tablet (5 mg total) by mouth 2 (two) times daily. Patient not taking: Reported on 07/09/2023 03/27/23   Christell Constant, MD  methimazole (TAPAZOLE) 10 MG tablet Take 1 tablet (10 mg total) by mouth every 8 (eight) hours. 03/22/23 06/20/23  Rhetta Mura, MD  metoprolol tartrate 37.5 MG TABS Take 1 tablet (37.5 mg total) by mouth 2 (two) times daily. 03/22/23 06/20/23  Rhetta Mura, MD  spironolactone (ALDACTONE) 25 MG tablet Take 1 tablet (25 mg total) by mouth daily. Patient not taking: Reported on 07/09/2023 03/27/23   Christell Constant, MD    Social History   Socioeconomic History   Marital status: Single    Spouse name: Not on file   Number of children: Not on file   Years of education: Not on file   Highest education level: Not on file  Occupational History  Not on file  Tobacco Use   Smoking status: Never   Smokeless tobacco: Never  Vaping Use   Vaping status: Never Used  Substance and Sexual Activity   Alcohol use: No   Drug use: Yes    Frequency: 3.0 times per week    Types: Marijuana   Sexual activity: Never  Other Topics Concern   Not on file  Social History Narrative   Not on file    Social Drivers of Health   Financial Resource Strain: Medium Risk (04/01/2023)   Overall Financial Resource Strain (CARDIA)    Difficulty of Paying Living Expenses: Somewhat hard  Food Insecurity: No Food Insecurity (07/10/2023)   Hunger Vital Sign    Worried About Running Out of Food in the Last Year: Never true    Ran Out of Food in the Last Year: Never true  Transportation Needs: No Transportation Needs (07/10/2023)   PRAPARE - Administrator, Civil Service (Medical): No    Lack of Transportation (Non-Medical): No  Physical Activity: Not on file  Stress: Not on file  Social Connections: Not on file  Intimate Partner Violence: Not At Risk (07/10/2023)   Humiliation, Afraid, Rape, and Kick questionnaire    Fear of Current or Ex-Partner: No    Emotionally Abused: No    Physically Abused: No    Sexually Abused: No   Family History  Problem Relation Age of Onset   Heart disease Other     ROS: Otherwise negative unless mentioned in HPI  Physical Examination  Vitals:   07/18/23 1545 07/18/23 1600  BP:    Pulse: (!) 109 (!) 106  Resp: 18 18  Temp: (!) 100.8 F (38.2 C) (!) 100.8 F (38.2 C)  SpO2: 99% 99%   Body mass index is 21.23 kg/m.  General: Intubated sedated Gait: Not observed HENT: Intubated Pulmonary: Intubated Cardiac: ECMO Abdomen: Soft Skin: Without rashes Vascular Exam/Pulses: Right 2+ DP pulse, left side nonpalpable. Left femoral arterial cannula with reperfusion sheath, right sided venous sheath Extremities: without ischemic changes, without Gangrene , without cellulitis; without open wounds;  Musculoskeletal: no muscle wasting or atrophy  Neurologic: A&O X 3;  No focal weakness or paresthesias are detected; speech is fluent/normal Psychiatric:  The pt has Normal affect. Lymph:  Unremarkable  CBC    Component Value Date/Time   WBC 26.1 (H) 07/18/2023 1531   RBC 3.14 (L) 07/18/2023 1531   HGB 9.0 (L) 07/18/2023 1531   HCT 27.2 (L)  07/18/2023 1531   PLT 168 07/18/2023 1531   MCV 86.6 07/18/2023 1531   MCH 28.7 07/18/2023 1531   MCHC 33.1 07/18/2023 1531   RDW 21.0 (H) 07/18/2023 1531   LYMPHSABS 1.8 03/20/2023 0429   MONOABS 1.2 (H) 03/20/2023 0429   EOSABS 0.1 03/20/2023 0429   BASOSABS 0.0 03/20/2023 0429    BMET    Component Value Date/Time   NA 136 07/18/2023 1228   K 3.8 07/18/2023 1228   CL 101 07/18/2023 1228   CO2 24 07/18/2023 0351   CO2 24 07/18/2023 0351   GLUCOSE 187 (H) 07/18/2023 1228   BUN 64 (H) 07/18/2023 1228   CREATININE 4.00 (H) 07/18/2023 1228   CALCIUM 8.3 (L) 07/18/2023 0351   CALCIUM 8.3 (L) 07/18/2023 0351   GFRNONAA 21 (L) 07/18/2023 0351   GFRNONAA 21 (L) 07/18/2023 0351   GFRAA >60 08/16/2016 0502    COAGS: Lab Results  Component Value Date   INR 1.4 (H) 07/18/2023  INR 1.3 (H) 07/17/2023   INR 1.3 (H) 07/16/2023      ASSESSMENT/PLAN: This is a 38 y.o. male currently on ECMO. he is improve dramatically.  I was called by heart failure for decannulation.  I discussed this with Aaron [name]'s family.  After discussing the risks and benefits, they elected to proceed. Will be today.   Victorino Sparrow MD MS [facility] [phone] 07/18/2023  4:36 PM

## 2023-07-18 NOTE — Procedures (Signed)
 Admit: 07/09/2023 LOS: 9  70M AKI likely ATN after SCA, cardiogenic shock on ECMO and Impella, AHRF/VDRF  Current CRRT Prescription: Start Date: 07/11/23 Catheter: Using ECMO circuit BFR: 115 Pre Blood Pump: 340 4K DFR: 1500 4K Replacement Rate: 500 2K Goal UF: reduced, AHF assisting Anticoagulation: bivalrudin Clotting: none  S: No major events Remains on dialysis Weaning ECMO K4.2, phosphorus 2.4 No clotting on angiomax  O: 03/06 0701 - 03/07 0700 In: 5905.5 [I.V.:2333.8; NG/GT:1760; IV Piggyback:1563.8] Out: 5348.3   Filed Weights   07/16/23 0500 07/17/23 0500 07/18/23 0600  Weight: 80.1 kg (S) 78.3 kg 79.1 kg    Recent Labs  Lab 07/17/23 0115 07/17/23 0247 07/17/23 1720 07/17/23 1730 07/18/23 0351 07/18/23 0356 07/18/23 0737  NA 135  137   < > 134*   < > 138  137 137 137  K 4.6  4.6   < > 4.6   < > 4.2  4.1 4.2 3.8  CL 102  101  --  99  --  102  101  --   --   CO2 20*  23  --  22  --  24  24  --   --   GLUCOSE 135*  131*  --  258*  --  145*  142*  --   --   BUN 67*  64*  --  68*  --  68*  68*  --   --   CREATININE 3.95*  3.81*  --  3.64*  --  3.59*  3.64*  --   --   CALCIUM 8.0*  8.0*  --  7.7*  --  8.3*  8.3*  --   --   PHOS 4.3  --  2.2*  --  2.4*  --   --    < > = values in this interval not displayed.   Recent Labs  Lab 07/17/23 1720 07/17/23 1730 07/18/23 0013 07/18/23 0351 07/18/23 0356 07/18/23 0737  WBC 24.8*  --  19.9* 23.2*  --   --   HGB 8.8*   < > 7.4* 7.7* 7.5* 7.8*  HCT 26.8*   < > 22.7* 22.7* 22.0* 23.0*  MCV 85.9  --  86.3 85.7  --   --   PLT 132*  --  118* 119*  --   --    < > = values in this interval not displayed.    Scheduled Meds:  sodium chloride   Intravenous Once   aspirin  81 mg Oral Daily   Chlorhexidine Gluconate Cloth  6 each Topical Daily   clonazePAM  1 mg Oral BID   docusate  100 mg Oral BID   feeding supplement (PROSource TF20)  60 mL Per Tube TID   fentaNYL (SUBLIMAZE) injection  50 mcg  Intravenous Once   folic acid  1 mg Oral Daily   hydrocortisone sod succinate (SOLU-CORTEF) inj  25 mg Intravenous Q12H   insulin aspart  0-20 Units Subcutaneous Q4H   insulin aspart  5 Units Subcutaneous Q4H   methimazole  10 mg Oral Q8H   multivitamin  1 tablet Oral QHS   mouth rinse  15 mL Mouth Rinse Q2H   oxyCODONE  5 mg Oral Q6H   pantoprazole (PROTONIX) IV  40 mg Intravenous QHS   polyethylene glycol  17 g Oral Daily   sodium bicarbonate  50 mEq Intravenous Once   sodium chloride flush  3 mL Intravenous Q12H   thiamine  100 mg Oral Daily  Or   thiamine  100 mg Intravenous Daily   Continuous Infusions:  albumin human 250 mL/hr at 07/18/23 1000   amiodarone 30 mg/hr (07/18/23 1000)   amiodarone     bivalirudin (ANGIOMAX) 250 mg in sodium chloride 0.9 % 500 mL (0.5 mg/mL) infusion 0.024 mg/kg/hr (07/18/23 1000)   feeding supplement (PIVOT 1.5 CAL) 65 mL/hr at 07/18/23 1000   HYDROmorphone 4 mg/hr (07/18/23 1000)   lidocaine 1 mg/min (07/18/23 1000)   meropenem (MERREM) IV Stopped (07/18/23 0655)   midazolam 7 mg/hr (07/18/23 1000)   milrinone 0.125 mcg/kg/min (07/18/23 1000)   norepinephrine (LEVOPHED) Adult infusion Stopped (07/18/23 0955)   PrismaSol BGK 2/3.5 500 mL/hr at 07/18/23 0555   prismasol BGK 4/2.5 1,500 mL/hr at 07/18/23 0946   prismasol BGK 4/2.5 340 mL/hr at 07/18/23 0249   sodium bicarbonate 25 mEq (Impella PURGE) in dextrose 5 % 1000 mL bag 5.8 mL/hr at 07/13/23 0248   vancomycin Stopped (07/17/23 1224)   vasopressin 0.03 Units/min (07/18/23 1000)   PRN Meds:.acetaminophen **OR** acetaminophen, albumin human, heparin, HYDROmorphone, midazolam, mouth rinse  ABG    Component Value Date/Time   PHART 7.437 07/18/2023 0737   PCO2ART 35.5 07/18/2023 0737   PO2ART 129 (H) 07/18/2023 0737   HCO3 23.9 07/18/2023 0737   TCO2 25 07/18/2023 0737   ACIDBASEDEF 1.0 07/18/2023 0008   O2SAT 99 07/18/2023 0737   Inbutaed, sedated Cont hum  Coarse  bs Sedated Ill appearing  A/P  Dialysis dependent anuric AKI 2/2 ATN from #2 and #3 on CRRT Cardiogenic Shock on ECMO + Impella; status post transcutaneous mitral valve repair VT + PEA SCA  PE on angiomax per CCM Anemia, Hb stable, per AHF Hyperkalemia, stable using 2K bath as post replacement fluid Severe MR s/p TEER 3/5  Continue CRRT at current settings.  AHF/CCM assisting with UF rates.   Sabra Heck, MD [facility]

## 2023-07-18 NOTE — CV Procedure (Signed)
 ECMO NOTE:   Indication: Cardiogenic shock   Initial cannulation date: 07/10/23   ECMO type: [place] ECMO with Impella CP vent   Dual lumen inflow/return cannula:   1) 25 FR multi-stage venous drainage RFV 2) 21 FR arterial return LCFA 3) 6FR L SFA distal perfusion catheter 4) Impella CP via R CFA LV vent    Daily data:   Flow 2.04 RPM 2520 Sweep  1.5L Blender at 100%   Impella at P5 2.4L flow   Labs:   ABG    Component Value Date/Time   PHART 7.436 07/18/2023 0356   PCO2ART 36.1 07/18/2023 0356   PO2ART 107 07/18/2023 0356   HCO3 24.4 07/18/2023 0356   TCO2 25 07/18/2023 0356   ACIDBASEDEF 1.0 07/18/2023 0008   O2SAT 98 07/18/2023 0356    Hgb 7.7 Platelets 119 LDH >2091 PTT 75; discussed dosing with pharmD Lactic acid 0.9   Plan:  - Continue V-A ECMO support - Continue to wean flows; target 1.5 LPM with plan to decannulate soon.    Quasean Frye, DO  8:46 AM

## 2023-07-18 NOTE — Transfer of Care (Signed)
 Immediate Anesthesia Transfer of Care Note  Patient: Aaron [name]  Procedure(s) Performed: Peggye Ley FOR  ECMO (Extracorporeal Membrane Oxygenation) (Groin) EMBOLECTOMY Left Femoral, Popliteal and iliac arterys,  Repair Left common femoral artery. (Left: Groin)  Patient Location: ICU  Anesthesia Type:General  Level of Consciousness: sedated and Patient remains intubated per anesthesia plan  Airway & Oxygen Therapy: Patient remains intubated per anesthesia plan and Patient placed on Ventilator (see vital sign flow sheet for setting)  Post-op Assessment: Report given to RN and Post -op Vital signs reviewed and stable  Post vital signs: Reviewed and stable  Last Vitals:  Vitals Value Taken Time  BP    Temp 37 C 07/18/23 2007  Pulse 75 07/18/23 2007  Resp 22 07/18/23 2007  SpO2 100 % 07/18/23 2007  Vitals shown include unfiled device data.  Last Pain:  Vitals:   07/18/23 1600  TempSrc: Core  PainSc:          Complications: No notable events documented.

## 2023-07-18 NOTE — Progress Notes (Signed)
 Patient taken to the OR for ECMO decannulation. Due to PVCS during the start of the case, amio gtt increased to 60mg /hr & impella flows decreased from P5 at 3LPM to P4 at 2-2.3LPM. He tolerated decannulation well with no hemodynamic derangement. After discussion with vascular surgery, decision made to restart heparin at 2000. Will start at 500U/hr and escalate to therapeutic dosing by midnight. Can start levophed to maintain MAP>65. Will repeat lactic acid & ABG. Spoke with patient's significant other and sister.   CC TIME: 90 minutes  Ciela Mahajan 7:43 PM

## 2023-07-18 NOTE — Progress Notes (Signed)
 NAME:  Aaron [name], MRN:  [id number], DOB:  30-Sep-1985, LOS: 9 ADMISSION DATE:  07/09/2023, CONSULTATION DATE:  07/11/2023 REFERRING MD: Clearnce Hasten, CHIEF COMPLAINT: Status post cardiac arrest  History of Present Illness:  38 year old male with hypothyroidism, paroxysmal A-fib, severe mitral regurgitation who presented to [facility] with chest pain and shortness of breath for few days associated cough, nausea and diarrhea.  In the emergency department he was diagnosed with PE, was started on IV heparin.  He takes beta-blocker, steroid and methimazole for hyperthyroidism.  On 2/27 patient rapidly deteriorated developed V. tach/V-fib cardiac arrest patient was intubated after 40 minutes of CPR, cardiology was consulted patient was placed on VA ECMO and was transferred to [facility]  Pertinent  Medical History   Past Medical History:  Diagnosis Date   Atrial fibrillation [facility])    Hyperthyroidism    Mitral regurgitation    NSTEMI (non-ST elevated myocardial infarction) (HCC)     Significant Hospital Events: Including procedures, antibiotic start and stop dates in addition to other pertinent events     Interim History / Subjective:  ECMO flow was stopped to 2.5 L  Impella is at P5 with 2.3 L flow, tolerating well so far Few episodes of VT's, was started on lidocaine infusion after lidocaine bolus Currently on amiodarone and lidocaine FiO2 dropped to 50% on ventilator Remained afebrile  Objective   Blood pressure 100/75, pulse 65, temperature 99.3 F (37.4 C), resp. rate 18, height 6\' 4"  (1.93 m), weight 79.1 kg, SpO2 100%. PAP: (25-93)/(7-37) 48/11 CVP:  [0 mmHg-10 mmHg] 7 mmHg  Vent Mode: PRVC FiO2 (%):  [40 %-80 %] 50 % Set Rate:  [10 bmp-18 bmp] 18 bmp Vt Set:  [580 mL] 580 mL PEEP:  [5 cmH20] 5 cmH20 Plateau Pressure:  [18 cmH20-25 cmH20] 22 cmH20   Intake/Output Summary (Last 24 hours) at 07/18/2023 0738 Last data filed at 07/18/2023 0700 Gross per 24  hour  Intake 5905.49 ml  Output 5348.3 ml  Net 557.19 ml   Filed Weights   07/16/23 0500 07/17/23 0500 07/18/23 0600  Weight: 80.1 kg (S) 78.3 kg 79.1 kg    Examination: General: Crtitically ill-appearing young male, orally intubated HEENT: [place]/AT, eyes anicteric.  ETT and OGT in place Neuro: Sedated, not following commands.  Eyes are closed.  Pupils 3 mm bilateral reactive to light Chest: Coarse breath sounds, no wheezes or rhonchi Heart: Regular rate and rhythm, no murmurs or gallops Abdomen: Soft, nondistended, bowel sounds present Skin: Multiple tattoo marks noted on chest and bilateral upper extremities Extremities: Hypokalemia noted in groin, oozing has stopped   Labs and images reviewed  Resolved Hospital Problem list   Lactic acidosis, resolved Refractory hyperkalemia, improving  Assessment & Plan:  Status post in-hospital V. tach followed by PEA cardiac arrest Recurrent nonsustained VT's Paroxysmal A-fib RVR, currently in sinus rhythm Severe mitral regurgitation status post mitral clips Severe pulmonary hypertension on iNO Acute biventricular HFrEF with cardiogenic shock on VA ECMO Acute pulmonary emboli status post TNK Acute respiratory failure with hypoxia Hyperthyroidism Acute kidney injury due to ischemic ATN on CRRT Acute respiratory failure with hypoxia Bilateral multifocal pneumonia with MSSA Hyperphosphatemia/hypocalcemia Shock liver Acute on chronic anemia due to critical illness and hemolysis Thrombocytopenia due to critical illness  VA ECMO was titrated down to 2 L, patient had few runs of VT's He was given lidocaine bolus followed by lidocaine infusion He is on amiodarone Impella is at P5 with 2.5 L flow Currently  in sinus rhythm Continue inhaled nitric oxide at 30 ppm His pulmonary artery pressure improved from 80s to 40s Continue milrinone at 0.125 Keep net even on CRRT Continue bival, PTT is at goal Continue lung protective  ventilation FiO2 titrated down to 50% Continue broad-spectrum antibiotics until ECMO decannulation Closely monitor and supplement electrolytes Will transfuse 1 unit PRBC considering hemoglobin 7.7 Closely monitor platelet count, currently stable around 120 Precedex was stopped to prevent QT prolongation Continue Versed and Dilaudid infusion Continue methimazole Continue stress dose steroids 25 mg twice daily LDH is coming down  Best Practice (right click and "Reselect all SmartList Selections" daily)   Diet/type: NPO TF DVT prophylaxis systemic bival Pressure ulcer(s): N/A GI prophylaxis: PPI Lines: Central line, Arterial Line, and yes and it is still needed.  ECMO cannula in place Foley:  Yes, and it is still needed Code Status:  full code Last date of multidisciplinary goals of care discussion [Per primary team]  Labs   CBC: Recent Labs  Lab 07/17/23 0045 07/17/23 0104 07/17/23 0636 07/17/23 2536 07/17/23 1720 07/17/23 1730 07/17/23 2056 07/18/23 0008 07/18/23 0013 07/18/23 0351 07/18/23 0356  WBC 19.0*  --  18.1*  --  24.8*  --   --   --  19.9* 23.2*  --   HGB 7.8*   < > 8.6*   < > 8.8*   < > 8.5* 7.5* 7.4* 7.7* 7.5*  HCT 24.4*   < > 26.7*   < > 26.8*   < > 25.0* 22.0* 22.7* 22.7* 22.0*  MCV 85.9  --  85.9  --  85.9  --   --   --  86.3 85.7  --   PLT 88*  --  97*  --  132*  --   --   --  118* 119*  --    < > = values in this interval not displayed.    Basic Metabolic Panel: Recent Labs  Lab 07/14/23 0327 07/14/23 0732 07/15/23 0415 07/15/23 0423 07/16/23 0403 07/16/23 0411 07/16/23 1635 07/16/23 1636 07/16/23 1644 07/17/23 0115 07/17/23 0247 07/17/23 1720 07/17/23 1730 07/17/23 2005 07/17/23 2056 07/18/23 0008 07/18/23 0351 07/18/23 0356  NA 137   < > 136   < > 136   < > 138 138   < > 135  137   < > 134*   < > 138 137 138 138  137 137  K 4.0   < > 3.8   < > 5.3*   < > 5.3* 5.2*   < > 4.6  4.6   < > 4.6   < > 4.2 4.0 4.1 4.2  4.1 4.2  CL 92*    < > 94*   < > 100   < > 102 103  --  102  101  --  99  --   --   --   --  102  101  --   CO2 34*   < > 29   < > 28  --  23 25  --  20*  23  --  22  --   --   --   --  24  24  --   GLUCOSE 219*   < > 170*   < > 130*   < > 141* 147*  --  135*  131*  --  258*  --   --   --   --  145*  142*  --   BUN 46*   < >  56*   < > 63*   < > 69* 72*  --  67*  64*  --  68*  --   --   --   --  68*  68*  --   CREATININE 3.26*   < > 3.63*   < > 3.61*   < > 4.11* 4.23*  --  3.95*  3.81*  --  3.64*  --   --   --   --  3.59*  3.64*  --   CALCIUM 8.9   < > 8.5*   < > 8.1*  --  7.6* 7.7*  --  8.0*  8.0*  --  7.7*  --   --   --   --  8.3*  8.3*  --   MG 2.2  --  2.0  --  2.5*  --   --   --   --  2.4  --   --   --   --   --   --  2.8*  --   PHOS 2.5   < > 2.2*   < > 3.4  --   --  5.5*  --  4.3  --  2.2*  --   --   --   --  2.4*  --    < > = values in this interval not displayed.   GFR: Estimated Creatinine Clearance: 31.2 mL/min (A) (by C-G formula based on SCr of 3.59 mg/dL (H)). Recent Labs  Lab 07/15/23 0424 07/15/23 1602 07/16/23 0417 07/16/23 1635 07/17/23 0325 07/17/23 0636 07/17/23 1720 07/18/23 0013 07/18/23 0351 07/18/23 0403  WBC  --    < >  --    < >  --  18.1* 24.8* 19.9* 23.2*  --   LATICACIDVEN 0.9  --  0.7  --  0.9  --   --   --   --  0.8   < > = values in this interval not displayed.    Liver Function Tests: Recent Labs  Lab 07/14/23 0327 07/14/23 1603 07/15/23 0415 07/15/23 1600 07/16/23 0403 07/16/23 1636 07/17/23 0115 07/17/23 1720 07/18/23 0351  AST 767*  --  538*  --  197*  --  98*  --  48*  ALT 224*  --  138*  --  90*  --  50*  --  25  ALKPHOS 68  --  73  --  91  --  79  --  84  BILITOT 7.9*  --  4.2*  --  2.2*  --  2.2*  --  1.6*  PROT NOT CALCULATED  --  5.1*  --  6.2*  --  6.4*  --  6.6  ALBUMIN 2.2*   < > 2.1*  2.1*   < > 2.4*  2.4* 2.2* 2.6*  2.6* 2.6* 2.9*  2.9*   < > = values in this interval not displayed.   No results for input(s): "LIPASE",  "AMYLASE" in the last 168 hours.  No results for input(s): "AMMONIA" in the last 168 hours.  ABG    Component Value Date/Time   PHART 7.436 07/18/2023 0356   PCO2ART 36.1 07/18/2023 0356   PO2ART 107 07/18/2023 0356   HCO3 24.4 07/18/2023 0356   TCO2 25 07/18/2023 0356   ACIDBASEDEF 1.0 07/18/2023 0008   O2SAT 98 07/18/2023 0356     Coagulation Profile: Recent Labs  Lab 07/14/23 0327 07/15/23 0415 07/16/23 0403 07/17/23 0115 07/18/23 0351  INR 1.4* 1.1 1.3* 1.3* 1.4*  Cardiac Enzymes: Recent Labs  Lab 07/11/23 1854  CKTOTAL 357    HbA1C: Hgb A1c MFr Bld  Date/Time Value Ref Range Status  07/10/2023 06:44 AM 4.8 4.8 - 5.6 % Final    Comment:    (NOTE) Pre diabetes:          5.7%-6.4%  Diabetes:              >6.4%  Glycemic control for   <7.0% adults with diabetes   10/20/2021 01:07 AM 4.2 (L) 4.8 - 5.6 % Final    Comment:    (NOTE) Pre diabetes:          5.7%-6.4%  Diabetes:              >6.4%  Glycemic control for   <7.0% adults with diabetes     CBG: Recent Labs  Lab 07/17/23 1524 07/17/23 2003 07/18/23 0006 07/18/23 0353 07/18/23 0735  GLUCAP 272* 137* 175* 132* 137*    Critical care time:     The patient is critically ill due to cardiogenic shock status post VA ECMO.  Critical care was necessary to treat or prevent imminent or life-threatening deterioration.  Critical care was time spent personally by me on the following activities: development of treatment plan with patient and/or surrogate as well as nursing, discussions with consultants, evaluation of patient's response to treatment, examination of patient, obtaining history from patient or surrogate, ordering and performing treatments and interventions, ordering and review of laboratory studies, ordering and review of radiographic studies, pulse oximetry, re-evaluation of patient's condition and participation in multidisciplinary rounds.   During this encounter critical care time was  devoted to patient care services described in this note for 43 minutes.     Cheri Fowler, MD [facility] See Amion for pager If no response to pager, please call [phone] until 7pm After 7pm, Please call E-link [phone]

## 2023-07-18 NOTE — Progress Notes (Addendum)
 PHARMACY - ANTICOAGULATION CONSULT NOTE  Pharmacy Consult for bivalirudin Indication:  ECMO, Impella  No Known Allergies  Patient Measurements: Height: 6\' 4"  (193 cm) Weight: 79.1 kg (174 lb 6.1 oz) IBW/kg (Calculated) : 86.8 Heparin Dosing Weight: n/a  Vital Signs: Temp: 100.6 F (38.1 C) (03/07 1630) Temp Source: Core (03/07 1600) BP: 141/71 (03/07 1542) Pulse Rate: 100 (03/07 1700)  Labs: Recent Labs    07/16/23 0403 07/16/23 0411 07/17/23 0115 07/17/23 0247 07/18/23 0013 07/18/23 0351 07/18/23 0356 07/18/23 1100 07/18/23 1227 07/18/23 1228 07/18/23 1531 07/18/23 1532  HGB 9.2*   < >  --    < > 7.4* 7.7*   < > 8.5*   < > 9.2* 9.0* 9.5*  HCT 27.6*   < >  --    < > 22.7* 22.7*   < > 25.0*   < > 27.0* 27.2* 28.0*  PLT 92*   < >  --    < > 118* 119*  --   --   --   --  168  --   APTT 57*   < > 62*   < >  --  75*  --  74*  --   --  67*  --   LABPROT 16.4*  --  16.3*  --   --  17.0*  --   --   --   --   --   --   INR 1.3*  --  1.3*  --   --  1.4*  --   --   --   --   --   --   CREATININE 3.61*   < > 3.95*  3.81*   < >  --  3.59*  3.64*  --   --   --  4.00* 3.85*  --    < > = values in this interval not displayed.    Estimated Creatinine Clearance: 29.1 mL/min (A) (by C-G formula based on SCr of 3.85 mg/dL (H)).   Medical History: Past Medical History:  Diagnosis Date   Atrial fibrillation [facility])    Hyperthyroidism    Mitral regurgitation    NSTEMI (non-ST elevated myocardial infarction) [facility])     Assessment: 38 yo M with bilateral PE s/p TNK (2/27 @1156 ) now on [place] ECMO + Impella. Pharmacy consulted for bivalirudin for anticoagulation.   S/p impella CP >5.5 3/3 - bivalirudin was previously held with oozing at insertion site but restart 3/4. Patient underwent mitraclip 3/5.  aPTT 67 is therapeutic on bivalirudin@0 .02 mg/kg/hr. Remains on CRRT. Patient is getting decannulated 3/7.   Goal of Therapy:  aPTT 50-70 seconds Monitor platelets by anticoagulation  protocol: Yes   Plan:  Continue bivalirudin to 0.02 mg/kg/hr (using dosing weight of 78.3 kg) aPTT q12h  F/u CBC, LDH, fibrinogen, Monitor s/s bleeding  F/u post decannulation  ADDENDUM 20:45: Switch bivalirudin to heparin per team, no bolus 500 units/hr until 01:00 aPTT/heparin level, then target therapeutic goal heparin level 0.3-0.7 units/mL   Thank you for allowing pharmacy to participate in this patient's care,  Alphia Moh, PharmD, BCPS, BCCP Clinical Pharmacist  Please check AMION for all [facility] Pharmacy phone numbers After 10:00 PM, call Main Pharmacy [phone]

## 2023-07-18 NOTE — Progress Notes (Signed)
 Advanced Heart Failure Rounding Note  Cardiologist: Christell Constant, MD  Chief Complaint: V-A ECMO  Subjective:    Admitted 2/26 with worsening shortness of breath, chest pain, atrial fibrillation with RVR. Decompensated 2/27 with IVF, nodal blockade with subsequent respiratory arrest, ROSC achieved after ~16 minute down time Femoral VA ecmo cannulation 2/27 with Impella CP vent  - CVP 5, PA 48/11, BP 121/65  - +500 q24H   Objective:   Weight Range: 79.1 kg Body mass index is 21.23 kg/m.   Vital Signs:   Temp:  [97.9 F (36.6 C)-100.4 F (38 C)] 99.1 F (37.3 C) (03/07 0830) Pulse Rate:  [34-77] 63 (03/07 0830) Resp:  [10-22] 18 (03/07 0830) SpO2:  [98 %-100 %] 100 % (03/07 0830) Arterial Line BP: (60-140)/(53-80) 120/62 (03/07 0830) FiO2 (%):  [40 %-80 %] 50 % (03/07 0825) Weight:  [79.1 kg] 79.1 kg (03/07 0600) Last BM Date : 07/17/23  Weight change: Filed Weights   07/16/23 0500 07/17/23 0500 07/18/23 0600  Weight: 80.1 kg (S) 78.3 kg 79.1 kg    Intake/Output:   Intake/Output Summary (Last 24 hours) at 07/18/2023 0853 Last data filed at 07/18/2023 0800 Gross per 24 hour  Intake 5958.08 ml  Output 5325.1 ml  Net 632.98 ml      Physical Exam    General: sedated/intubated Lungs: mechanical lung sounds CV: bradycardic; lines well sutured with no hematoma, erythema or bleeding. Femoral CP & ECMO cannulas sutured into place; no bleeding at cannula site; mild oozing. No hematoma; impella 5.5 axillary site without bleeding Abdomen: soft Neurologic: sedated   Telemetry   NSR 60s-70s Labs    CBC Recent Labs    07/18/23 0013 07/18/23 0351 07/18/23 0356  WBC 19.9* 23.2*  --   HGB 7.4* 7.7* 7.5*  HCT 22.7* 22.7* 22.0*  MCV 86.3 85.7  --   PLT 118* 119*  --    Basic Metabolic Panel Recent Labs    81/19/14 0115 07/17/23 0247 07/17/23 1720 07/17/23 1730 07/18/23 0351 07/18/23 0356  NA 135  137   < > 134*   < > 138  137 137  K 4.6   4.6   < > 4.6   < > 4.2  4.1 4.2  CL 102  101  --  99  --  102  101  --   CO2 20*  23  --  22  --  24  24  --   GLUCOSE 135*  131*  --  258*  --  145*  142*  --   BUN 67*  64*  --  68*  --  68*  68*  --   CREATININE 3.95*  3.81*  --  3.64*  --  3.59*  3.64*  --   CALCIUM 8.0*  8.0*  --  7.7*  --  8.3*  8.3*  --   MG 2.4  --   --   --  2.8*  --   PHOS 4.3  --  2.2*  --  2.4*  --    < > = values in this interval not displayed.   Liver Function Tests Recent Labs    07/17/23 0115 07/17/23 1720 07/18/23 0351  AST 98*  --  48*  ALT 50*  --  25  ALKPHOS 79  --  84  BILITOT 2.2*  --  1.6*  PROT 6.4*  --  6.6  ALBUMIN 2.6*  2.6* 2.6* 2.9*  2.9*    BNP:  BNP (last 3 results) Recent Labs    07/09/23 1934 07/10/23 1224  BNP 703.7* 980.5*   Thyroid Function Tests Recent Labs    07/16/23 0403  TSH <0.010*     Medications:     Scheduled Medications:  sodium chloride   Intravenous Once   sodium chloride   Intravenous Once   aspirin  81 mg Oral Daily   Chlorhexidine Gluconate Cloth  6 each Topical Daily   clonazePAM  1 mg Oral BID   docusate  100 mg Oral BID   feeding supplement (PROSource TF20)  60 mL Per Tube TID   fentaNYL (SUBLIMAZE) injection  50 mcg Intravenous Once   fiber supplement (BANATROL TF)  60 mL Per Tube BID   folic acid  1 mg Oral Daily   hydrocortisone sod succinate (SOLU-CORTEF) inj  25 mg Intravenous Q12H   insulin aspart  0-20 Units Subcutaneous Q4H   insulin aspart  5 Units Subcutaneous Q4H   methimazole  10 mg Oral Q8H   multivitamin  1 tablet Oral QHS   mouth rinse  15 mL Mouth Rinse Q2H   oxyCODONE  5 mg Oral Q6H   pantoprazole (PROTONIX) IV  40 mg Intravenous QHS   polyethylene glycol  17 g Oral Daily   sodium bicarbonate  50 mEq Intravenous Once   sodium chloride flush  3 mL Intravenous Q12H   thiamine  100 mg Oral Daily   Or   thiamine  100 mg Intravenous Daily    Infusions:  albumin human 999 mL/hr at 07/18/23 0800    amiodarone 60 mg/hr (07/18/23 0800)   amiodarone     bivalirudin (ANGIOMAX) 250 mg in sodium chloride 0.9 % 500 mL (0.5 mg/mL) infusion 0.024 mg/kg/hr (07/18/23 0800)   feeding supplement (PIVOT 1.5 CAL) 65 mL/hr at 07/18/23 0800   HYDROmorphone 4 mg/hr (07/18/23 0800)   lidocaine 1 mg/min (07/18/23 0800)   meropenem (MERREM) IV Stopped (07/18/23 0655)   midazolam 6 mg/hr (07/18/23 0800)   milrinone 0.125 mcg/kg/min (07/18/23 0800)   norepinephrine (LEVOPHED) Adult infusion 2 mcg/min (07/18/23 0800)   PrismaSol BGK 2/3.5 500 mL/hr at 07/18/23 0555   prismasol BGK 4/2.5 1,500 mL/hr at 07/17/23 2346   prismasol BGK 4/2.5 340 mL/hr at 07/18/23 0249   sodium bicarbonate 25 mEq (Impella PURGE) in dextrose 5 % 1000 mL bag 5.8 mL/hr at 07/13/23 0248   vancomycin Stopped (07/17/23 1224)   vasopressin 0.03 Units/min (07/18/23 0800)    PRN Medications: acetaminophen **OR** acetaminophen, albumin human, heparin, HYDROmorphone, midazolam, mouth rinse    Patient Profile   Patient with a history of Grave's disease, severe primary mitral regurgitation who presented with chest pain and segmental PE. Subsequent progressed to SCAI Stage E shock requiring VA ECMO cannulation on 2/27.   Assessment/Plan   SCAI Stage E Cardiogenic shock - Suspect hypoxic respiratory failure driven with segmental PE on top of severe MR, nodal blockage, and thyrotoxicosis - Down time ~ 20 minutes, good mental status confirmed post code - Lactic acid resolved; repeat this AM pending.  - Vanc/meropenem for antibiotic prophylaxis, suspect leukocytosis is reactive and in the setting of steroids; WBC ct now plateau at 32K.  - Impella 5.5 placed yesterday; bleeding from axillary pocket has now resolved. Restarting bivalirudin - We performed a mini-turn down on 07/15/23 at bedside; ECMO flows were reduced in a stepwise pattern by 0.5LPM to a goal of 2.5LPM. During this time, impella 5.5 flows increased to maintain net even flows.  With  reduction of ECMO flows, patient had onset of frequent PVCs/NSVT requiring amio boluses. In addition, despite increased LV unloading patient continued to have severe 4+ MR with progressive RV failure on bedside TTE.  - Underwent successful TEER on 07/16/23 with placement of x3 clips and improvement in MR from severe to mild. Mean mitral gradient of 3-20mmHg.  - Frequent VT overnight with low flows on ECMO that have now resolved; albumin x 3 overnight. This AM, maintaining net even I/Os to slightly net negative.  - ECMO flow weaned to 2LPM, impella at p5 flowing 2.5LPM. BP 122/66 on levophed .  - Will continue to wean V-A flows; hopeful to reach 1.5LPM. If he tolerates this will plan for decannulation.   Severe mitral valve regurgitation - Noted on previous echocardiogram - Suspect contributing to ongoing shock - ECPella for management currently - Now s/p TEER on 07/16/23; improvement in MR from severe to mild.   Pulmonary hypertension - PA pressures reached 80s/30s with PCWP of ~10, TPG of ~54mmHg suggestive of PVR >5 on 07/17/22 - Responding very well to inhaled nitric; improvement in PA systolic to the 40s.  - Continue nitric; see above  Anemia - LDH significantly improved.  - Tranfuse 1U PRBC; goal Hgb >8  Acute renal failure/hyperkalemia - Appreciate nephrology consult - CRRT set up through ECMO circuit - see above  Thyrotoxicosis - Was not taking medications as they made him feel poorly - Suspect underlying low output heart failure due to mitral valve disease - Methimazole decreased to 10mg  TID - TSH below detectable limit; T4 0.54 07/14/23,  - Repeat T4 of 0.43; discussed with pharmD.   Atrial fibrillation - Present on arrival, in the setting of PE and thyroid disease - sinus rhythm today.   Pulmonary embolism:  - Segmental without right heart strain - Bivalrudin as above  CAD - Prior embolic infarct in the LAD territory with corresponding LGE on CMR - Lifelong  anticoagulation  Sedation - Fentanyl, precedex - Versed as needed - Spot EEG without evidence of seizure - Notably minimal alcohol use after discussion with fiance.   Medication concerns reviewed with patient and pharmacy team. Barriers identified: antibiotic dosing, CRRT  Length of Stay: 9  Kieth Hartis, DO  07/18/2023, 8:53 AM  Advanced Heart Failure Team Pager [phone] (M-F; 7a - 5p)  Please contact [facility] for night-coverage after hours (5p -7a ) and weekends on [email or web address]  CRITICAL CARE Performed by: Dorthula Nettles   Total critical care time: 65 minutes  Critical care time was exclusive of separately billable procedures and treating other patients.  Critical care was necessary to treat or prevent imminent or life-threatening deterioration.  Critical care was time spent personally by me on the following activities: development of treatment plan with patient and/or surrogate as well as nursing, discussions with consultants, evaluation of patient's response to treatment, examination of patient, obtaining history from patient or surrogate, ordering and performing treatments and interventions, ordering and review of laboratory studies, ordering and review of radiographic studies, pulse oximetry and re-evaluation of patient's condition.

## 2023-07-18 NOTE — Anesthesia Preprocedure Evaluation (Addendum)
 Anesthesia Evaluation  Patient identified by MRN, date of birth, ID band Patient unresponsive    Reviewed: NPO status , Patient's Chart, lab work & pertinent test results, reviewed documented beta blocker date and time , Unable to perform ROS - Chart review only  History of Anesthesia Complications Negative for: history of anesthetic complications  Airway Mallampati: Intubated       Dental no notable dental hx.    Pulmonary       + intubated    Cardiovascular hypertension, + Past MI   Rhythm:Regular Rate:Tachycardia  VT yesterday, now on amiodarone and lidocaine infusions  ECMO clamped for 1 minute, milrinone 0.44mcg/kg/min:  CVP 6, PA 44/13, BP 101/46, lactic acid 0.8, O2 sat 100% TTE w/ LVEF 30-35%, RV increasingly dilated with mild to moderately reduced function.     Neuro/Psych    GI/Hepatic   Endo/Other   Hyperthyroidism   Renal/GU      Musculoskeletal   Abdominal   Peds  Hematology  (+) Blood dyscrasia, anemia   Anesthesia Other Findings   Reproductive/Obstetrics                             Anesthesia Physical Anesthesia Plan  ASA: 4  Anesthesia Plan: General   Post-op Pain Management:    Induction: Inhalational  PONV Risk Score and Plan: 2 and Ondansetron and Dexamethasone  Airway Management Planned: Oral ETT  Additional Equipment: Arterial line, CVP and PA Cath  Intra-op Plan:   Post-operative Plan: Post-operative intubation/ventilation  Informed Consent: I have reviewed the patients History and Physical, chart, labs and discussed the procedure including the risks, benefits and alternatives for the proposed anesthesia with the patient or authorized representative who has indicated his/her understanding and acceptance.     Dental advisory given  Plan Discussed with: CRNA  Anesthesia Plan Comments:         Anesthesia Quick Evaluation

## 2023-07-18 NOTE — Progress Notes (Signed)
 PHARMACY - ANTICOAGULATION CONSULT NOTE  Pharmacy Consult for bivalirudin Indication:  ECMO, Impella  No Known Allergies  Patient Measurements: Height: 6\' 4"  (193 cm) Weight: (S) 78.3 kg (172 lb 9.9 oz) (WITHOUT FOOTBOARD ON) IBW/kg (Calculated) : 86.8 Heparin Dosing Weight: n/a  Vital Signs: Temp: 99.3 F (37.4 C) (03/07 0400) Temp Source: Core (03/07 0400) Pulse Rate: 66 (03/07 0436)  Labs: Recent Labs    07/16/23 0403 07/16/23 0411 07/17/23 0115 07/17/23 0247 07/17/23 1720 07/17/23 1730 07/18/23 0013 07/18/23 0351 07/18/23 0356  HGB 9.2*   < >  --    < > 8.8*   < > 7.4* 7.7* 7.5*  HCT 27.6*   < >  --    < > 26.8*   < > 22.7* 22.7* 22.0*  PLT 92*   < >  --    < > 132*  --  118* 119*  --   APTT 57*   < > 62*  --  73*  --   --  75*  --   LABPROT 16.4*  --  16.3*  --   --   --   --  17.0*  --   INR 1.3*  --  1.3*  --   --   --   --  1.4*  --   CREATININE 3.61*   < > 3.95*  3.81*  --  3.64*  --   --  3.59*  3.64*  --    < > = values in this interval not displayed.    Estimated Creatinine Clearance: 30.9 mL/min (A) (by C-G formula based on SCr of 3.59 mg/dL (H)).   Medical History: Past Medical History:  Diagnosis Date   Atrial fibrillation [facility])    Hyperthyroidism    Mitral regurgitation    NSTEMI (non-ST elevated myocardial infarction) [facility])     Assessment: 38 yo M with bilateral PE s/p TNK (2/27 @1156 ) now on [place] ECMO + Impella. Pharmacy consulted for bivalirudin for anticoagulation.   S/p impella CP >5.5 3/3 - bivalirudin was previously held with oozing at insertion site but restart 3/4. Patient underwent mitraclip yesterday - bivalirudin was delayed on start. aPTT is therapeutic at 62, on bivalirudin@0 .03 mg/kg/hr. Hgb 8.6, plt 97. LDH 2091. LFTs continue to trend down, total bilirubin remains stable at 2.2. Continued oozing near cannula site where access obtained for mitraclip. No fibrin in ECMO circuit, impella flowing without issues.   APTT this evening  is above the established goal.  No overt bleeding or complications noted.  3/7 AM update:  aPTT supra-therapeutic   Goal of Therapy:  aPTT 50-70 seconds Monitor platelets by anticoagulation protocol: Yes   Plan:  Dec bivalirudin to 0.024 mg/kg/hr aPTT q12h  F/u CBC, LDH, fibrinogen Monitor s/s bleeding   Abran Duke, PharmD, BCPS Clinical Pharmacist Phone: [phone]

## 2023-07-18 NOTE — Anesthesia Postprocedure Evaluation (Signed)
 Anesthesia Post Note  Patient: Aaron [name]  Procedure(s) Performed: Peggye Ley FOR  ECMO (Extracorporeal Membrane Oxygenation) (Groin) EMBOLECTOMY Left Femoral, Popliteal and iliac arterys,  Repair Left common femoral artery. (Left: Groin)     Patient location during evaluation: SICU Anesthesia Type: General Level of consciousness: sedated Pain management: pain level controlled Vital Signs Assessment: post-procedure vital signs reviewed and stable Respiratory status: patient remains intubated per anesthesia plan Cardiovascular status: stable Postop Assessment: no apparent nausea or vomiting Anesthetic complications: no   No notable events documented.  Last Vitals:  Vitals:   07/18/23 2152 07/18/23 2200  BP:    Pulse: 89 (!) 102  Resp: (!) 22 (!) 22  Temp: 37.4 C 37.4 C  SpO2: 100% 100%    Last Pain:  Vitals:   07/18/23 2100  TempSrc: Core  PainSc:                  Collene Schlichter

## 2023-07-18 NOTE — Progress Notes (Signed)
  Echocardiogram 2D Echocardiogram has been performed.  Aaron [name] 07/18/2023, 12:32 PM

## 2023-07-18 NOTE — Progress Notes (Signed)
 Pt transported on vent with iNO by 2 RRTs back to 2H 22 from OR 16 without complication.

## 2023-07-18 NOTE — Progress Notes (Signed)
 2 Days Post-Op Procedure(s) (LRB): TRANSCATHETER MITRAL EDGE TO EDGE REPAIR (N/A) TRANSESOPHAGEAL ECHOCARDIOGRAM (N/A) Subjective: Intubated, sedated  Objective: Vital signs in last 24 hours: Temp:  [97.9 F (36.6 C)-100.6 F (38.1 C)] 100.6 F (38.1 C) (03/07 1515) Pulse Rate:  [34-118] 114 (03/07 1515) Cardiac Rhythm: Normal sinus rhythm (03/07 1200) Resp:  [13-22] 20 (03/07 1515) SpO2:  [97 %-100 %] 98 % (03/07 1515) Arterial Line BP: (93-173)/(54-82) 162/79 (03/07 1515) FiO2 (%):  [40 %-50 %] 50 % (03/07 1200) Weight:  [79.1 kg] 79.1 kg (03/07 0600)  Hemodynamic parameters for last 24 hours: PAP: (35-81)/(7-28) 45/28 CVP:  [3 mmHg-10 mmHg] 5 mmHg  Intake/Output from previous day: 03/06 0701 - 03/07 0700 In: 5905.5 [I.V.:2333.8; NG/GT:1760; IV Piggyback:1563.8] Out: 5348.3  Intake/Output this shift: Total I/O In: 2362.8 [I.V.:648; Blood:315; Other:84.6; NG/GT:750; IV Piggyback:565.2] Out: 1336 [Stool:35]  General appearance: intubated Neurologic: sedated Wound: dressing with old blood  Lab Results: Recent Labs    07/18/23 0013 07/18/23 0351 07/18/23 0356 07/18/23 1227 07/18/23 1228  WBC 19.9* 23.2*  --   --   --   HGB 7.4* 7.7*   < > 9.2* 9.2*  HCT 22.7* 22.7*   < > 27.0* 27.0*  PLT 118* 119*  --   --   --    < > = values in this interval not displayed.   BMET:  Recent Labs    07/17/23 1720 07/17/23 1730 07/18/23 0351 07/18/23 0356 07/18/23 1227 07/18/23 1228  NA 134*   < > 138  137   < > 137 136  K 4.6   < > 4.2  4.1   < > 3.8 3.8  CL 99  --  102  101  --   --  101  CO2 22  --  24  24  --   --   --   GLUCOSE 258*  --  145*  142*  --   --  187*  BUN 68*  --  68*  68*  --   --  64*  CREATININE 3.64*  --  3.59*  3.64*  --   --  4.00*  CALCIUM 7.7*  --  8.3*  8.3*  --   --   --    < > = values in this interval not displayed.    PT/INR:  Recent Labs    07/18/23 0351  LABPROT 17.0*  INR 1.4*   ABG    Component Value Date/Time    PHART 7.407 07/18/2023 1227   HCO3 24.2 07/18/2023 1227   TCO2 23 07/18/2023 1228   ACIDBASEDEF 1.0 07/18/2023 1100   O2SAT 95 07/18/2023 1227   CBG (last 3)  Recent Labs    07/18/23 0353 07/18/23 0735 07/18/23 1057  GLUCAP 132* 137* 200*    Assessment/Plan: S/P Procedure(s) (LRB): TRANSCATHETER MITRAL EDGE TO EDGE REPAIR (N/A) TRANSESOPHAGEAL ECHOCARDIOGRAM (N/A) Impella repositioned by Dr. Gasper Lloyd yesterday  P5 with 2.5L/min flow Tolerated trial wean of ECMO today, hoping to decannulate tomorrow   LOS: 9 days    Aaron [name] 07/18/2023

## 2023-07-18 NOTE — Progress Notes (Incomplete)
   07/17/23 2309  Cardiohelp  Flow (LPM) 2.44  Speed (RPMs) (S)  2780 (ECMO flows spontaneously dropped to below 2L/min, no chugging noted. no bleeding noted. 1 Albumin given no acute response to it. increase RPM's. patient is clinically stable. MAP's stable at . Will give another Albumin)     ECMO flows spontaneously dropped from 2.4/2.5L/min to below 2L/min (1.8-1.9L/min ) sustained without any significant chugging events or ECMO pressure changes. Internal, arterial, and transmembrance ECMO pressures remain stable and unchanged.

## 2023-07-19 ENCOUNTER — Inpatient Hospital Stay (HOSPITAL_COMMUNITY)

## 2023-07-19 DIAGNOSIS — R509 Fever, unspecified: Secondary | ICD-10-CM

## 2023-07-19 DIAGNOSIS — R0602 Shortness of breath: Secondary | ICD-10-CM | POA: Diagnosis not present

## 2023-07-19 DIAGNOSIS — E875 Hyperkalemia: Secondary | ICD-10-CM | POA: Diagnosis not present

## 2023-07-19 DIAGNOSIS — R0989 Other specified symptoms and signs involving the circulatory and respiratory systems: Secondary | ICD-10-CM | POA: Diagnosis not present

## 2023-07-19 DIAGNOSIS — R000 Tachycardia, unspecified: Secondary | ICD-10-CM | POA: Diagnosis not present

## 2023-07-19 DIAGNOSIS — J9809 Other diseases of bronchus, not elsewhere classified: Secondary | ICD-10-CM

## 2023-07-19 DIAGNOSIS — K7200 Acute and subacute hepatic failure without coma: Secondary | ICD-10-CM

## 2023-07-19 DIAGNOSIS — J90 Pleural effusion, not elsewhere classified: Secondary | ICD-10-CM | POA: Diagnosis not present

## 2023-07-19 DIAGNOSIS — R079 Chest pain, unspecified: Secondary | ICD-10-CM | POA: Diagnosis not present

## 2023-07-19 DIAGNOSIS — N170 Acute kidney failure with tubular necrosis: Secondary | ICD-10-CM | POA: Diagnosis not present

## 2023-07-19 DIAGNOSIS — J9601 Acute respiratory failure with hypoxia: Secondary | ICD-10-CM | POA: Diagnosis not present

## 2023-07-19 DIAGNOSIS — E0590 Thyrotoxicosis, unspecified without thyrotoxic crisis or storm: Secondary | ICD-10-CM | POA: Diagnosis not present

## 2023-07-19 DIAGNOSIS — I10 Essential (primary) hypertension: Secondary | ICD-10-CM

## 2023-07-19 DIAGNOSIS — G9340 Encephalopathy, unspecified: Secondary | ICD-10-CM | POA: Diagnosis not present

## 2023-07-19 DIAGNOSIS — E872 Acidosis, unspecified: Secondary | ICD-10-CM | POA: Diagnosis not present

## 2023-07-19 DIAGNOSIS — Z452 Encounter for adjustment and management of vascular access device: Secondary | ICD-10-CM | POA: Diagnosis not present

## 2023-07-19 DIAGNOSIS — R918 Other nonspecific abnormal finding of lung field: Secondary | ICD-10-CM | POA: Diagnosis not present

## 2023-07-19 DIAGNOSIS — I272 Pulmonary hypertension, unspecified: Secondary | ICD-10-CM | POA: Diagnosis not present

## 2023-07-19 DIAGNOSIS — R570 Cardiogenic shock: Secondary | ICD-10-CM | POA: Diagnosis not present

## 2023-07-19 DIAGNOSIS — R9389 Abnormal findings on diagnostic imaging of other specified body structures: Secondary | ICD-10-CM | POA: Diagnosis not present

## 2023-07-19 DIAGNOSIS — N179 Acute kidney failure, unspecified: Secondary | ICD-10-CM | POA: Diagnosis not present

## 2023-07-19 DIAGNOSIS — I2699 Other pulmonary embolism without acute cor pulmonale: Secondary | ICD-10-CM | POA: Diagnosis not present

## 2023-07-19 DIAGNOSIS — J811 Chronic pulmonary edema: Secondary | ICD-10-CM | POA: Diagnosis not present

## 2023-07-19 DIAGNOSIS — I469 Cardiac arrest, cause unspecified: Secondary | ICD-10-CM | POA: Diagnosis not present

## 2023-07-19 LAB — RENAL FUNCTION PANEL
Albumin: 2.8 g/dL — ABNORMAL LOW (ref 3.5–5.0)
Albumin: 2.4 g/dL — ABNORMAL LOW (ref 3.5–5.0)
Anion gap: 12 (ref 5–15)
Anion gap: 16 — ABNORMAL HIGH (ref 5–15)
BUN: 93 mg/dL — ABNORMAL HIGH (ref 6–20)
BUN: 99 mg/dL — ABNORMAL HIGH (ref 6–20)
CO2: 23 mmol/L (ref 22–32)
CO2: 20 mmol/L — ABNORMAL LOW (ref 22–32)
Calcium: 8.1 mg/dL — ABNORMAL LOW (ref 8.9–10.3)
Calcium: 8.1 mg/dL — ABNORMAL LOW (ref 8.9–10.3)
Chloride: 101 mmol/L (ref 98–111)
Chloride: 98 mmol/L (ref 98–111)
Creatinine, Ser: 5.67 mg/dL — ABNORMAL HIGH (ref 0.61–1.24)
Creatinine, Ser: 5.65 mg/dL — ABNORMAL HIGH (ref 0.61–1.24)
GFR, Estimated: 12 mL/min — ABNORMAL LOW (ref >60)
GFR, Estimated: 12 mL/min — ABNORMAL LOW (ref >60)
Glucose, Bld: 146 mg/dL — ABNORMAL HIGH (ref 70–99)
Glucose, Bld: 140 mg/dL — ABNORMAL HIGH (ref 70–99)
Phosphorus: 3.4 mg/dL (ref 2.5–4.6)
Phosphorus: 2 mg/dL — ABNORMAL LOW (ref 2.5–4.6)
Potassium: 4.6 mmol/L (ref 3.5–5.1)
Potassium: 3.6 mmol/L (ref 3.5–5.1)
Sodium: 136 mmol/L (ref 135–145)
Sodium: 134 mmol/L — ABNORMAL LOW (ref 135–145)

## 2023-07-19 LAB — BPAM RBC
Blood Product Expiration Date: 202503192359
Blood Product Expiration Date: 202503212359
Blood Product Expiration Date: 202503212359
Blood Product Expiration Date: 202503212359
Blood Product Expiration Date: 202503292359
Blood Product Expiration Date: 202503292359
ISSUE DATE / TIME: 202503060128
ISSUE DATE / TIME: 202503070820
ISSUE DATE / TIME: 202503071621
ISSUE DATE / TIME: 202503071621
ISSUE DATE / TIME: 202503071621
ISSUE DATE / TIME: 202503071621
Unit Type and Rh: 5100
Unit Type and Rh: 5100
Unit Type and Rh: 5100
Unit Type and Rh: 5100
Unit Type and Rh: 5100
Unit Type and Rh: 5100

## 2023-07-19 LAB — POCT I-STAT 7, (LYTES, BLD GAS, ICA,H+H)
Acid-base deficit: 4 mmol/L — ABNORMAL HIGH (ref 0.0–2.0)
Acid-base deficit: 4 mmol/L — ABNORMAL HIGH (ref 0.0–2.0)
Bicarbonate: 23 mmol/L (ref 20.0–28.0)
Bicarbonate: 20.7 mmol/L (ref 20.0–28.0)
Calcium, Ion: 1.12 mmol/L — ABNORMAL LOW (ref 1.15–1.40)
Calcium, Ion: 1.09 mmol/L — ABNORMAL LOW (ref 1.15–1.40)
HCT: 25 % — ABNORMAL LOW (ref 39.0–52.0)
HCT: 25 % — ABNORMAL LOW (ref 39.0–52.0)
Hemoglobin: 8.5 g/dL — ABNORMAL LOW (ref 13.0–17.0)
Hemoglobin: 8.5 g/dL — ABNORMAL LOW (ref 13.0–17.0)
O2 Saturation: 91 %
O2 Saturation: 100 %
Patient temperature: 38.8
Patient temperature: 36.7
Potassium: 4.6 mmol/L (ref 3.5–5.1)
Potassium: 3.5 mmol/L (ref 3.5–5.1)
Sodium: 135 mmol/L (ref 135–145)
Sodium: 132 mmol/L — ABNORMAL LOW (ref 135–145)
TCO2: 24 mmol/L (ref 22–32)
TCO2: 22 mmol/L (ref 22–32)
pCO2 arterial: 52.9 mmHg — ABNORMAL HIGH (ref 32–48)
pCO2 arterial: 33.8 mmHg (ref 32–48)
pH, Arterial: 7.256 — ABNORMAL LOW (ref 7.35–7.45)
pH, Arterial: 7.393 (ref 7.35–7.45)
pO2, Arterial: 77 mmHg — ABNORMAL LOW (ref 83–108)
pO2, Arterial: 189 mmHg — ABNORMAL HIGH (ref 83–108)

## 2023-07-19 LAB — HEPATIC FUNCTION PANEL
ALT: 20 U/L (ref 0–44)
AST: 49 U/L — ABNORMAL HIGH (ref 15–41)
Albumin: 2.8 g/dL — ABNORMAL LOW (ref 3.5–5.0)
Alkaline Phosphatase: 97 U/L (ref 38–126)
Bilirubin, Direct: 0.6 mg/dL — ABNORMAL HIGH (ref 0.0–0.2)
Indirect Bilirubin: 1.2 mg/dL — ABNORMAL HIGH (ref 0.3–0.9)
Total Bilirubin: 1.8 mg/dL — ABNORMAL HIGH (ref 0.0–1.2)
Total Protein: 6.7 g/dL (ref 6.5–8.1)

## 2023-07-19 LAB — POCT I-STAT EG7
Acid-base deficit: 5 mmol/L — ABNORMAL HIGH (ref 0.0–2.0)
Bicarbonate: 21.4 mmol/L (ref 20.0–28.0)
Calcium, Ion: 1.08 mmol/L — ABNORMAL LOW (ref 1.15–1.40)
HCT: 23 % — ABNORMAL LOW (ref 39.0–52.0)
Hemoglobin: 7.8 g/dL — ABNORMAL LOW (ref 13.0–17.0)
O2 Saturation: 75 %
Patient temperature: 37
Potassium: 4 mmol/L (ref 3.5–5.1)
Sodium: 135 mmol/L (ref 135–145)
TCO2: 23 mmol/L (ref 22–32)
pCO2, Ven: 46.2 mmHg (ref 44–60)
pH, Ven: 7.274 (ref 7.25–7.43)
pO2, Ven: 46 mmHg — ABNORMAL HIGH (ref 32–45)

## 2023-07-19 LAB — COOXEMETRY PANEL
Carboxyhemoglobin: 1.3 % (ref 0.5–1.5)
Carboxyhemoglobin: 2 % — ABNORMAL HIGH (ref 0.5–1.5)
Methemoglobin: 0.9 % (ref 0.0–1.5)
Methemoglobin: 1.5 % (ref 0.0–1.5)
O2 Saturation: 97.9 %
O2 Saturation: 67.8 %
Total hemoglobin: 7.4 g/dL — ABNORMAL LOW (ref 12.0–16.0)
Total hemoglobin: 7.7 g/dL — ABNORMAL LOW (ref 12.0–16.0)

## 2023-07-19 LAB — TYPE AND SCREEN
ABO/RH(D): O POS
Antibody Screen: NEGATIVE
Unit division: 0
Unit division: 0
Unit division: 0
Unit division: 0
Unit division: 0
Unit division: 0

## 2023-07-19 LAB — PROCALCITONIN: Procalcitonin: 3.72 ng/mL

## 2023-07-19 LAB — FIBRINOGEN: Fibrinogen: 639 mg/dL — ABNORMAL HIGH (ref 210–475)

## 2023-07-19 LAB — CBC
HCT: 25.1 % — ABNORMAL LOW (ref 39.0–52.0)
Hemoglobin: 8.3 g/dL — ABNORMAL LOW (ref 13.0–17.0)
MCH: 29.2 pg (ref 26.0–34.0)
MCHC: 33.1 g/dL (ref 30.0–36.0)
MCV: 88.4 fL (ref 80.0–100.0)
Platelets: 188 10*3/uL (ref 150–400)
RBC: 2.84 MIL/uL — ABNORMAL LOW (ref 4.22–5.81)
RDW: 20.5 % — ABNORMAL HIGH (ref 11.5–15.5)
WBC: 32.6 10*3/uL — ABNORMAL HIGH (ref 4.0–10.5)
nRBC: 0.1 % (ref 0.0–0.2)

## 2023-07-19 LAB — GLUCOSE, CAPILLARY
Glucose-Capillary: 118 mg/dL — ABNORMAL HIGH (ref 70–99)
Glucose-Capillary: 125 mg/dL — ABNORMAL HIGH (ref 70–99)
Glucose-Capillary: 155 mg/dL — ABNORMAL HIGH (ref 70–99)
Glucose-Capillary: 169 mg/dL — ABNORMAL HIGH (ref 70–99)
Glucose-Capillary: 191 mg/dL — ABNORMAL HIGH (ref 70–99)
Glucose-Capillary: 209 mg/dL — ABNORMAL HIGH (ref 70–99)

## 2023-07-19 LAB — HEPARIN LEVEL (UNFRACTIONATED)
Heparin Unfractionated: <0.1 [IU]/mL — ABNORMAL LOW (ref 0.30–0.70)
Heparin Unfractionated: <0.1 [IU]/mL — ABNORMAL LOW (ref 0.30–0.70)
Heparin Unfractionated: <0.1 [IU]/mL — ABNORMAL LOW (ref 0.30–0.70)
Heparin Unfractionated: <0.1 [IU]/mL — ABNORMAL LOW (ref 0.30–0.70)

## 2023-07-19 LAB — PROTIME-INR
INR: 1.2 (ref 0.8–1.2)
Prothrombin Time: 15.3 s — ABNORMAL HIGH (ref 11.4–15.2)

## 2023-07-19 LAB — LACTATE DEHYDROGENASE: LDH: 1202 U/L — ABNORMAL HIGH (ref 98–192)

## 2023-07-19 LAB — APTT: aPTT: 49 s — ABNORMAL HIGH (ref 24–36)

## 2023-07-19 LAB — CG4 I-STAT (LACTIC ACID): Lactic Acid, Venous: 0.7 mmol/L (ref 0.5–1.9)

## 2023-07-19 LAB — MAGNESIUM: Magnesium: 2.6 mg/dL — ABNORMAL HIGH (ref 1.7–2.4)

## 2023-07-19 MED ORDER — SODIUM PHOSPHATES 45 MMOLE/15ML IV SOLN
15.0000 mmol | Freq: Once | INTRAVENOUS | Status: AC
  Administered 2023-07-19: 15 mmol via INTRAVENOUS
  Filled 2023-07-19: qty 5

## 2023-07-19 MED ORDER — SODIUM CHLORIDE 0.9 % IV SOLN: INTRAVENOUS | Status: AC | PRN

## 2023-07-19 MED ORDER — QUETIAPINE FUMARATE 50 MG PO TABS
50.0000 mg | ORAL_TABLET | Freq: Two times a day (BID) | ORAL | Status: DC
  Administered 2023-07-19 – 2023-07-20 (×3): 50 mg
  Filled 2023-07-19 (×3): qty 1

## 2023-07-19 MED ORDER — PROPOFOL 1000 MG/100ML IV EMUL
5.0000 ug/kg/min | INTRAVENOUS | Status: DC
  Administered 2023-07-19: 45 ug/kg/min via INTRAVENOUS
  Administered 2023-07-19: 5 ug/kg/min via INTRAVENOUS
  Administered 2023-07-19 – 2023-07-20 (×6): 45 ug/kg/min via INTRAVENOUS
  Administered 2023-07-20: 35 ug/kg/min via INTRAVENOUS
  Administered 2023-07-20 – 2023-07-21 (×2): 45 ug/kg/min via INTRAVENOUS
  Administered 2023-07-21: 35 ug/kg/min via INTRAVENOUS
  Administered 2023-07-21: 25 ug/kg/min via INTRAVENOUS
  Administered 2023-07-22: 55 ug/kg/min via INTRAVENOUS
  Administered 2023-07-22: 35 ug/kg/min via INTRAVENOUS
  Administered 2023-07-22 (×2): 55 ug/kg/min via INTRAVENOUS
  Filled 2023-07-19 (×9): qty 100
  Filled 2023-07-19: qty 200
  Filled 2023-07-19 (×8): qty 100

## 2023-07-19 MED ORDER — SODIUM CHLORIDE 0.9 % IV SOLN
100.0000 mg | INTRAVENOUS | Status: DC
  Filled 2023-07-19: qty 5

## 2023-07-19 MED ORDER — AMIODARONE LOAD VIA INFUSION: 150.0000 mg | Freq: Once | INTRAVENOUS | Status: DC

## 2023-07-19 MED ORDER — FUROSEMIDE 10 MG/ML IJ SOLN
120.0000 mg | Freq: Once | INTRAVENOUS | Status: DC
  Filled 2023-07-19: qty 12

## 2023-07-19 MED ORDER — ACETAMINOPHEN 325 MG PO TABS
650.0000 mg | ORAL_TABLET | Freq: Four times a day (QID) | ORAL | Status: DC
  Administered 2023-07-19 – 2023-07-26 (×28): 650 mg
  Filled 2023-07-19 (×27): qty 2

## 2023-07-19 MED ORDER — AMIODARONE LOAD VIA INFUSION
150.0000 mg | Freq: Once | INTRAVENOUS | Status: DC
  Filled 2023-07-19: qty 83.34

## 2023-07-19 MED ORDER — SODIUM CHLORIDE 0.9 % IV SOLN
200.0000 mg | Freq: Once | INTRAVENOUS | Status: AC
  Administered 2023-07-19: 200 mg via INTRAVENOUS
  Filled 2023-07-19: qty 10

## 2023-07-19 MED ORDER — HEPARIN (PORCINE) 25000 UT/250ML-% IV SOLN
1850.0000 [IU]/h | INTRAVENOUS | Status: AC
  Administered 2023-07-19: 1000 [IU]/h via INTRAVENOUS
  Administered 2023-07-20: 1600 [IU]/h via INTRAVENOUS
  Administered 2023-07-20: 1200 [IU]/h via INTRAVENOUS
  Administered 2023-07-21 – 2023-07-22 (×3): 1850 [IU]/h via INTRAVENOUS
  Filled 2023-07-19 (×6): qty 250

## 2023-07-19 NOTE — Procedures (Signed)
 Bronchoscopy Procedure Note  LOGON UTTECH  [id number]  December 29, 1985  Date:07/19/23  Time:11:39 AM   Provider Performing:Colten Desroches C Katrinka Blazing   Procedure(s):  Flexible bronchoscopy with bronchial alveolar lavage [phone]) and Initial Therapeutic Aspiration of Tracheobronchial Tree [phone])  Indication(s) Abnormal CXR  Consent Part of ECMO consent  Anesthesia In place for mechanical ventilation   Time Out Verified patient identification, verified procedure, site/side was marked, verified correct patient position, special equipment/implants available, medications/allergies/relevant history reviewed, required imaging and test results available.   Sterile Technique Usual hand hygiene, masks, gowns, and gloves were used   Procedure Description Bronchoscope advanced through ETT  Immediately apparent both LUL and LLL occluded by tenacious brown mucus plugs requiring aggressive suction/lavaging to clear out  Underlying airways with some chronic bronchitic changes  Sent LLL BAL for culture  RLL airways also partially occluded by more whitish secretions and some spillover brown secretions   Complications/Tolerance None; patient tolerated the procedure well. Chest X-ray is not needed post procedure.   EBL Minimal   Specimen(s) LLL BAL

## 2023-07-19 NOTE — Progress Notes (Signed)
 PHARMACY - ANTICOAGULATION CONSULT NOTE  Pharmacy Consult for Heparin  Indication: pulmonary embolus, Impella CP>5.5, s/p ECMO decannulation   No Known Allergies  Patient Measurements: Height: 6\' 4"  (193 cm) Weight: 88.8 kg (195 lb 12.3 oz) IBW/kg (Calculated) : 86.8 Heparin Dosing Weight: n/a  Vital Signs: Temp: 98.6 F (37 C) (03/08 1130) Temp Source: Core (03/08 0803) Pulse Rate: 78 (03/08 1130)  Labs: Recent Labs    07/17/23 0115 07/17/23 0247 07/18/23 0351 07/18/23 0356 07/18/23 1100 07/18/23 1227 07/18/23 1228 07/18/23 1531 07/18/23 1532 07/19/23 0035 07/19/23 0347 07/19/23 0358 07/19/23 0402 07/19/23 0747 07/19/23 1152  HGB  --    < > 7.7*   < > 8.5*   < > 9.2* 9.0*   < >  --  8.3*  --  8.5*  --  7.8*  HCT  --    < > 22.7*   < > 25.0*   < > 27.0* 27.2*   < >  --  25.1*  --  25.0*  --  23.0*  PLT  --    < > 119*  --   --   --   --  168  --   --  188  --   --   --   --   APTT 62*   < > 75*  --  74*  --   --  67*  --  49*  --   --   --   --   --   LABPROT 16.3*  --  17.0*  --   --   --   --   --   --   --   --  15.3*  --   --   --   INR 1.3*  --  1.4*  --   --   --   --   --   --   --   --  1.2  --   --   --   HEPARINUNFRC  --   --   --   --   --   --   --   --   --  <0.10*  --   --   --  <0.10*  --   CREATININE 3.95*  3.81*   < > 3.59*  3.64*  --   --   --  4.00* 3.85*  --   --   --  5.67*  --   --   --    < > = values in this interval not displayed.    Estimated Creatinine Clearance: 21.7 mL/min (A) (by C-G formula based on SCr of 5.67 mg/dL (H)).   Medical History: Past Medical History:  Diagnosis Date   Atrial fibrillation [facility])    Hyperthyroidism    Mitral regurgitation    NSTEMI (non-ST elevated myocardial infarction) [facility])     Assessment: 38 yo M with bilateral PE s/p TNK (2/27 @1156 ) now on [place] ECMO + Impella. Pharmacy consulted for bivalirudin for anticoagulation.   S/p impella CP >5.5 3/3 - bivalirudin restart 3/4. Patient underwent  mitraclip 3/5, ECMO decannulation 3/7 pm  Heparin drip was started post de-cannulation  at 500 units/hr Heparin level was undetectable  Heparin increased 800 uts/hr and follow up heparin level remains undetectable  VAC on cannula sites no bleeding or hematoma - of for full anticoagulation per VVS   Goal of Therapy:  Heparin level 0.3-0.5 units/mL Monitor platelets by anticoagulation protocol: Yes   Plan:  Increase  heparin to 1000 units/hr Heparin level in 6 hours Sodium bicarb purge solution for Impella 5.5 Monitor closely for bleeding   Leota Sauers Pharm.D. CPP, BCPS Clinical Pharmacist [phone] 07/19/2023 1:13 PM

## 2023-07-19 NOTE — Progress Notes (Signed)
 PHARMACY - ANTICOAGULATION CONSULT NOTE  Pharmacy Consult for Heparin  Indication: pulmonary embolus, Impella, s/p ECMO decannulation   No Known Allergies  Patient Measurements: Height: 6\' 4"  (193 cm) Weight: 79.1 kg (174 lb 6.1 oz) IBW/kg (Calculated) : 86.8 Heparin Dosing Weight: n/a  Vital Signs: Temp: 102 F (38.9 C) (03/08 0130) Temp Source: Core (03/08 0000) BP: 141/71 (03/07 1542) Pulse Rate: 128 (03/08 0130)  Labs: Recent Labs    07/16/23 0403 07/16/23 0411 07/17/23 0115 07/17/23 0247 07/18/23 0013 07/18/23 0351 07/18/23 0356 07/18/23 1100 07/18/23 1227 07/18/23 1228 07/18/23 1531 07/18/23 1532 07/18/23 1853 07/18/23 2106 07/19/23 0035  HGB 9.2*   < >  --    < > 7.4* 7.7*   < > 8.5*   < > 9.2* 9.0* 9.5* 8.5* 8.2*  --   HCT 27.6*   < >  --    < > 22.7* 22.7*   < > 25.0*   < > 27.0* 27.2* 28.0* 25.0* 24.0*  --   PLT 92*   < >  --    < > 118* 119*  --   --   --   --  168  --   --   --   --   APTT 57*   < > 62*   < >  --  75*  --  74*  --   --  67*  --   --   --  49*  LABPROT 16.4*  --  16.3*  --   --  17.0*  --   --   --   --   --   --   --   --   --   INR 1.3*  --  1.3*  --   --  1.4*  --   --   --   --   --   --   --   --   --   HEPARINUNFRC  --   --   --   --   --   --   --   --   --   --   --   --   --   --  <0.10*  CREATININE 3.61*   < > 3.95*  3.81*   < >  --  3.59*  3.64*  --   --   --  4.00* 3.85*  --   --   --   --    < > = values in this interval not displayed.    Estimated Creatinine Clearance: 29.1 mL/min (A) (by C-G formula based on SCr of 3.85 mg/dL (H)).   Medical History: Past Medical History:  Diagnosis Date   Atrial fibrillation [facility])    Hyperthyroidism    Mitral regurgitation    NSTEMI (non-ST elevated myocardial infarction) [facility])     Assessment: 38 yo M with bilateral PE s/p TNK (2/27 @1156 ) now on [place] ECMO + Impella. Pharmacy consulted for bivalirudin for anticoagulation.   S/p impella CP >5.5 3/3 - bivalirudin was previously  held with oozing at insertion site but restart 3/4. Patient underwent mitraclip 3/5.  aPTT 67 is therapeutic on bivalirudin@0 .02 mg/kg/hr. Remains on CRRT. Patient is getting decannulated 3/7.   3/8 AM update:  Pt is s/p ECMO decannulation 3/7 PM Heparin was started at 500 units/hr Heparin level is undetectable  Impella remains in place  Goal of Therapy:  Heparin level 0.3-0.5 units/mL Monitor platelets by anticoagulation protocol: Yes   Plan:  Inc  heparin to 800 units/hr Heparin level in 6 hours Monitor closely for bleeding  Abran Duke, PharmD, BCPS Clinical Pharmacist Phone: [phone]

## 2023-07-19 NOTE — Progress Notes (Addendum)
 NAME:  Aaron [name], MRN:  [id number], DOB:  1985-05-21, LOS: 10 ADMISSION DATE:  07/09/2023, CONSULTATION DATE:  07/11/2023 REFERRING MD: Clearnce Hasten, CHIEF COMPLAINT: Status post cardiac arrest  History of Present Illness:  38 year old male with hypothyroidism, paroxysmal A-fib, severe mitral regurgitation who presented to [facility] long hospital with chest pain and shortness of breath for few days associated cough, nausea and diarrhea.  In the emergency department he was diagnosed with PE, was started on IV heparin.  He takes beta-blocker, steroid and methimazole for hyperthyroidism.  On 2/27 patient rapidly deteriorated developed V. tach/V-fib cardiac arrest patient was intubated after 40 minutes of CPR, cardiology was consulted patient was placed on [place] ECMO and was transferred to [facility]  Pertinent  Medical History   Past Medical History:  Diagnosis Date   Atrial fibrillation [facility])    Hyperthyroidism    Mitral regurgitation    NSTEMI (non-ST elevated myocardial infarction) (HCC)     Significant Hospital Events: Including procedures, antibiotic start and stop dates in addition to other pertinent events   3/5 salvage TEER (transcatheter edge to edge mitral valve repair 3/7 weaned off VA  Interim History / Subjective:  Tolerated decannulation Issues with HTN, agitation, fevers overnight, prop started in addition to dilaudid, versed gtt  Objective   Blood pressure (!) 141/71, pulse (!) 103, temperature (!) 100.8 F (38.2 C), resp. rate (!) 22, height 6\' 4"  (1.93 m), weight 88.8 kg, SpO2 96%. PAP: (20-81)/(10-29) 29/11 CVP:  [3 mmHg-19 mmHg] 14 mmHg  Vent Mode: PRVC FiO2 (%):  [50 %-100 %] 70 % Set Rate:  [18 bmp-22 bmp] 22 bmp Vt Set:  [580 mL-590 mL] 590 mL PEEP:  [5 cmH20] 5 cmH20 Plateau Pressure:  [20 cmH20-25 cmH20] 25 cmH20   Intake/Output Summary (Last 24 hours) at 07/19/2023 0730 Last data filed at 07/19/2023 0700 Gross per 24 hour  Intake 5111.94 ml   Output 1580 ml  Net 3531.94 ml   Filed Weights   07/17/23 0500 07/18/23 0600 07/19/23 0600  Weight: (S) 78.3 kg 79.1 kg 88.8 kg    Examination: No distress Fighting vent but lung mechanics okay Not following commands Pupils equal ECMO groin site looks okay Ext warm to touch 5-5 impella P4 2LPM flow iNO ongoing  ABG noted, vent adjusted LDH downtrending Bili up slightly but this will lag BUN/Cr up  Resolved Hospital Problem list   Lactic acidosis, resolved Refractory hyperkalemia, improving  Assessment & Plan:  Baseline severe mitral insufficiency with superimposed RV>LV shock state culminating in prolonged IHCA s/p VA ECMO 2/27- salvage TEER 3/5, weaned from [place] to impella 3/7. Post arrest encephalopathy- following commands a few days ago per nursing, neg head imaging Acute renal failure- now requiring CRRT Coagulopathy and hemolysis- improving Thyrotoxicosis- on methimazole, stress steroids; weaning latter 3/7 Shock liver- improving Ongoing severe multifactorial pulmonary HTN- remains on iNO 3/6 worsening fevers, rising WBC- vanc added 3/6 HTN/Tachycardia- worsened by fevers, ablated  Bival per PharmD Sedation for vent synchrony; pressors seem to be a byproduct of prop Vanc/meropenem; check Pct, tracheal aspirate; low threshold to initiate antifungals if ongoing given prolonged critical illness iNO for now; need to discuss impella/iNO wean with AHF; question if can try transition to oral agent to augment RV offloading Lung protective TV, check AM CXR, MV adjusted on vent Need to restart CRRT, HD line to be placed Tylenol to standing Once fevers, acidemia under control we can get better idea of what true sedation needs will be  and can be more aggressive with weaning  Best Practice (right click and "Reselect all SmartList Selections" daily)   Diet/type: NPO TF DVT prophylaxis systemic bival Pressure ulcer(s): N/A GI prophylaxis: PPI Lines: Central line, Arterial  Line, and yes and it is still needed.   Foley:  Yes, and it is still needed Code Status:  full code Last date of multidisciplinary goals of care discussion [Per primary team]   33 min cc time Myrla Halsted MD PCCM

## 2023-07-19 NOTE — Progress Notes (Signed)
 PHARMACY - ANTICOAGULATION CONSULT NOTE  Pharmacy Consult for Heparin  Indication: pulmonary embolus, Impella CP>5.5, s/p ECMO decannulation   No Known Allergies  Patient Measurements: Height: 6\' 4"  (193 cm) Weight: 88.8 kg (195 lb 12.3 oz) IBW/kg (Calculated) : 86.8 Heparin Dosing Weight: n/a  Vital Signs: Temp: 98.2 F (36.8 C) (03/08 1853) Pulse Rate: 63 (03/08 1853)  Labs: Recent Labs    07/17/23 0115 07/17/23 0247 07/18/23 0351 07/18/23 0356 07/18/23 1100 07/18/23 1227 07/18/23 1531 07/18/23 1532 07/19/23 0035 07/19/23 0347 07/19/23 0358 07/19/23 0402 07/19/23 0747 07/19/23 1152 07/19/23 1332 07/19/23 1608 07/19/23 1620 07/19/23 1930  HGB  --    < > 7.7*   < > 8.5*   < > 9.0*   < >  --  8.3*  --  8.5*  --  7.8*  --   --  8.5*  --   HCT  --    < > 22.7*   < > 25.0*   < > 27.2*   < >  --  25.1*  --  25.0*  --  23.0*  --   --  25.0*  --   PLT  --    < > 119*  --   --   --  168  --   --  188  --   --   --   --   --   --   --   --   APTT 62*   < > 75*  --  74*  --  67*  --  49*  --   --   --   --   --   --   --   --   --   LABPROT 16.3*  --  17.0*  --   --   --   --   --   --   --  15.3*  --   --   --   --   --   --   --   INR 1.3*  --  1.4*  --   --   --   --   --   --   --  1.2  --   --   --   --   --   --   --   HEPARINUNFRC  --   --   --   --   --   --   --    < > <0.10*  --   --   --  <0.10*  --  <0.10*  --   --  <0.10*  CREATININE 3.95*  3.81*   < > 3.59*  3.64*  --   --    < > 3.85*  --   --   --  5.67*  --   --   --   --  5.65*  --   --    < > = values in this interval not displayed.    Estimated Creatinine Clearance: 21.8 mL/min (A) (by C-G formula based on SCr of 5.65 mg/dL (H)).   Medical History: Past Medical History:  Diagnosis Date   Atrial fibrillation [facility])    Hyperthyroidism    Mitral regurgitation    NSTEMI (non-ST elevated myocardial infarction) [facility])     Assessment: 38 yo M with bilateral PE s/p TNK (2/27 @1156 ) now on [place] ECMO +  Impella. Pharmacy consulted for bivalirudin for anticoagulation.   S/p impella CP >5.5 3/3 - bivalirudin restart 3/4. Patient underwent  mitraclip 3/5, ECMO decannulation 3/7 pm  Heparin drip was started post de-cannulation  at 500 units/hr Heparin level was undetectable  Heparin increased 1000 units/hr and follow up heparin level remains undetectable  VAC on cannula sites no bleeding or hematoma - of for full anticoagulation per VVS   Goal of Therapy:  Heparin level 0.3-0.5 units/mL Monitor platelets by anticoagulation protocol: Yes   Plan:  Increase  heparin to 1200 units/hr Heparin level in 8 hours Sodium bicarb purge solution for Impella 5.5 Monitor closely for bleeding  Ruben Im, PharmD Clinical Pharmacist 07/19/2023 8:10 PM Please check AMION for all [facility] Pharmacy numbers

## 2023-07-19 NOTE — Progress Notes (Signed)
 eLink Physician-Brief Progress Note Patient Name: Aaron [name] DOB: 1985-10-08 MRN: [id number]   Date of Service  07/19/2023  HPI/Events of Note  Pt increasingly agitated, dyssynchronous with the vent.  On high dose dilaudid, versed and precedex.  Has also received multiple boluses of dilsaudid and versed without much effect.   eICU Interventions  Will start on a propofol drip for sedation. Will continue to monitor closely.         Eliazar Olivar M DELA CRUZ 07/19/2023, 1:49 AM

## 2023-07-19 NOTE — Procedures (Signed)
 Admit: 07/09/2023 LOS: 10  19M AKI likely ATN after SCA, cardiogenic shock on ECMO and Impella, AHRF/VDRF  Current CRRT Prescription: Start Date: 07/11/23 Catheter: pending placement by CCM 3/8 BFR: 115 Pre Blood Pump: 340 4K DFR: 1500 4K Replacement Rate: 500 2K Goal UF: reduced, AHF assisting Anticoagulation: heparin Clotting: none  S: Back on decannulation overnight CRRT on pause pending new vascular access, CCM to assist Agitation, fevers overnight K4.6, phosphorus 2.6  O: 03/07 0701 - 03/08 0700 In: 5118.3 [I.V.:2177.7; Blood:315; NG/GT:1640.8; IV Piggyback:752.8] Out: 1580 [Stool:70; Blood:50]  Filed Weights   07/17/23 0500 07/18/23 0600 07/19/23 0600  Weight: (S) 78.3 kg 79.1 kg 88.8 kg    Recent Labs  Lab 07/18/23 0351 07/18/23 0356 07/18/23 1228 07/18/23 1531 07/18/23 1532 07/18/23 2106 07/19/23 0358 07/19/23 0402  NA 138  137   < > 136 133*   < > 137 136 135  K 4.2  4.1   < > 3.8 4.1   < > 4.0 4.6 4.6  CL 102  101  --  101 98  --   --  101  --   CO2 24  24  --   --  21*  --   --  23  --   GLUCOSE 145*  142*  --  187* 159*  --   --  146*  --   BUN 68*  68*  --  64* 66*  --   --  93*  --   CREATININE 3.59*  3.64*  --  4.00* 3.85*  --   --  5.67*  --   CALCIUM 8.3*  8.3*  --   --  8.4*  --   --  8.1*  --   PHOS 2.4*  --   --  2.7  --   --  3.4  --    < > = values in this interval not displayed.   Recent Labs  Lab 07/18/23 0351 07/18/23 0356 07/18/23 1531 07/18/23 1532 07/18/23 2106 07/19/23 0347 07/19/23 0402  WBC 23.2*  --  26.1*  --   --  32.6*  --   HGB 7.7*   < > 9.0*   < > 8.2* 8.3* 8.5*  HCT 22.7*   < > 27.2*   < > 24.0* 25.1* 25.0*  MCV 85.7  --  86.6  --   --  88.4  --   PLT 119*  --  168  --   --  188  --    < > = values in this interval not displayed.    Scheduled Meds:  acetaminophen  650 mg Per Tube Q6H   aspirin  81 mg Oral Daily   Chlorhexidine Gluconate Cloth  6 each Topical Daily   clonazePAM  1 mg Oral BID    docusate  100 mg Oral BID   feeding supplement (PROSource TF20)  60 mL Per Tube TID   fentaNYL (SUBLIMAZE) injection  50 mcg Intravenous Once   folic acid  1 mg Oral Daily   insulin aspart  0-20 Units Subcutaneous Q4H   insulin aspart  5 Units Subcutaneous Q4H   methimazole  10 mg Oral Q8H   multivitamin  1 tablet Oral QHS   mouth rinse  15 mL Mouth Rinse Q2H   oxyCODONE  5 mg Oral Q6H   pantoprazole (PROTONIX) IV  40 mg Intravenous QHS   polyethylene glycol  17 g Oral Daily   sodium chloride flush  3 mL Intravenous Q12H  thiamine  100 mg Oral Daily   Or   thiamine  100 mg Intravenous Daily   Continuous Infusions:  amiodarone 60 mg/hr (07/19/23 0807)   amiodarone     dexmedetomidine (PRECEDEX) IV infusion 0.4 mcg/kg/hr (07/19/23 0807)   feeding supplement (PIVOT 1.5 CAL) 65 mL/hr at 07/19/23 0807   heparin 800 Units/hr (07/19/23 0807)   HYDROmorphone 8 mg/hr (07/19/23 0807)   lidocaine 1 mg/min (07/19/23 0807)   meropenem (MERREM) IV Stopped (07/19/23 1610)   midazolam 10 mg/hr (07/19/23 0807)   milrinone 0.25 mcg/kg/min (07/19/23 0807)   norepinephrine (LEVOPHED) Adult infusion 9 mcg/min (07/19/23 0807)   PrismaSol BGK 2/3.5 Stopped (07/18/23 2202)   prismasol BGK 4/2.5 Stopped (07/18/23 2131)   prismasol BGK 4/2.5 Stopped (07/18/23 2202)   propofol (DIPRIVAN) infusion 45 mcg/kg/min (07/19/23 0807)   sodium bicarbonate 25 mEq (Impella PURGE) in dextrose 5 % 1000 mL bag 5.8 mL/hr at 07/13/23 0248   vancomycin Stopped (07/18/23 1453)   vasopressin 0.02 Units/min (07/19/23 0807)   PRN Meds:.heparin, HYDROmorphone, midazolam, mouth rinse  ABG    Component Value Date/Time   PHART 7.256 (L) 07/19/2023 0402   PCO2ART 52.9 (H) 07/19/2023 0402   PO2ART 77 (L) 07/19/2023 0402   HCO3 23.0 07/19/2023 0402   TCO2 24 07/19/2023 0402   ACIDBASEDEF 4.0 (H) 07/19/2023 0402   O2SAT 91 07/19/2023 0402   Inbutaed, sedated Cont hum  Coarse bs Sedated Ill appearing  A/P  Dialysis  dependent anuric AKI 2/2 ATN from #2 and #3 on CRRT; resuming here today after as needed vascular access following ECMO decannulation Cardiogenic Shock on  Impella; status post transcutaneous mitral valve repair VT + PEA SCA  PE on heparin per CCM Anemia, Hb stable, per AHF Hyperkalemia, stable using 2K bath as post replacement fluid Severe MR s/p TEER 3/5  Continue CRRT at current settings once vascular access obtained.  AHF/CCM assisting with UF rates.   Sabra Heck, MD [facility]

## 2023-07-19 NOTE — Progress Notes (Signed)
 Pharmacy Antibiotic Note  Aaron [name] is a 38 y.o. male admitted on 07/09/2023 with SOB ultimately required  ECMO cannulation 2/28 + Impella CP > 5.5 then mitraclip 3/5, decannulation 3/7. Continues on CRRT. Pharmacy has been consulted for vancomcyin, meropenem and micafungin dosing.  Currently on meropenem/vancomycin - WBC up to 32 (on steroids- but weaning), Tm 102, PCT up 3.7, LA is 0.7, Trach aspirate 2/28 showing rare staph aureus (sensitivities pan-sensitive MSSA). However new fever spikes and increase tan mucus from bronch 3/8  Plan:  vancomycin to 1500 mg IV q24h - check trough this week Meropenem 1gm IV q8h  Add micafungin - await new Cx data   Height: 6\' 4"  (193 cm) Weight: 88.8 kg (195 lb 12.3 oz) IBW/kg (Calculated) : 86.8  Temp (24hrs), Avg:100.6 F (38.1 C), Min:98.4 F (36.9 C), Max:102 F (38.9 C)  Recent Labs  Lab 07/16/23 0757 07/16/23 1341 07/17/23 0325 07/17/23 0636 07/17/23 1720 07/18/23 0013 07/18/23 0351 07/18/23 0403 07/18/23 1228 07/18/23 1231 07/18/23 1531 07/18/23 2108 07/19/23 0347 07/19/23 0358 07/19/23 0403  WBC  --    < >  --    < > 24.8* 19.9* 23.2*  --   --   --  26.1*  --  32.6*  --   --   CREATININE  --    < >  --   --  3.64*  --  3.59*  3.64*  --  4.00*  --  3.85*  --   --  5.67*  --   LATICACIDVEN  --   --  0.9  --   --   --   --  0.8  --  0.8  --  0.6  --   --  0.7  VANCOTROUGH 9*  --   --   --   --   --   --   --   --   --   --   --   --   --   --    < > = values in this interval not displayed.    Estimated Creatinine Clearance: 21.7 mL/min (A) (by C-G formula based on SCr of 5.67 mg/dL (H)).    No Known Allergies    Leota Sauers Pharm.D. CPP, BCPS Clinical Pharmacist [phone] 07/19/2023 1:29 PM    Please check AMION for all [facility] Pharmacy phone numbers After 10:00 PM, call Main Pharmacy [phone]

## 2023-07-19 NOTE — Progress Notes (Signed)
 1 Day Post-Op Procedure(s) (LRB): DECANNULATION FOR  ECMO (Extracorporeal Membrane Oxygenation) (N/A) EMBOLECTOMY Left Femoral, Popliteal and iliac arterys,  Repair Left common femoral artery. (Left) Subjective:  De-cannulated yesterday and on Impella at P4 with flow 2.3L/min.  Milrinone 0.25, NE9, vaso 0.02  Sedated on vent  CRRT  Tmax 101.8 this am.  Objective: Vital signs in last 24 hours: Temp:  [98.6 F (37 C)-102 F (38.9 C)] 99.9 F (37.7 C) (03/08 0803) Pulse Rate:  [64-133] 82 (03/08 0803) Cardiac Rhythm: Normal sinus rhythm (03/08 0803) Resp:  [17-30] 30 (03/08 0803) BP: (141)/(71) 141/71 (03/07 1542) SpO2:  [91 %-100 %] 97 % (03/08 0803) Arterial Line BP: (81-173)/(39-88) 103/44 (03/08 0803) FiO2 (%):  [50 %-100 %] 70 % (03/08 0729) Weight:  [88.8 kg] 88.8 kg (03/08 0600)  Hemodynamic parameters for last 24 hours: PAP: (20-81)/(11-29) 22/12 CVP:  [5 mmHg-19 mmHg] 10 mmHg  Intake/Output from previous day: 03/07 0701 - 03/08 0700 In: 5118.3 [I.V.:2177.7; Blood:315; NG/GT:1640.8; IV Piggyback:752.8] Out: 1580 [Stool:70; Blood:50] Intake/Output this shift: Total I/O In: 483.5 [I.V.:245.7; Other:10.5; NG/GT:130; IV Piggyback:97.3] Out: 35 [Stool:35]  General appearance: sedated on vent Heart: regular rate and rhythm, S1, S2 normal, no murmur Lungs: coarse Extremities: moderate edema Wound: Impella incision intact with no hematoma or sign of infection  Lab Results: Recent Labs    07/18/23 1531 07/18/23 1532 07/19/23 0347 07/19/23 0402  WBC 26.1*  --  32.6*  --   HGB 9.0*   < > 8.3* 8.5*  HCT 27.2*   < > 25.1* 25.0*  PLT 168  --  188  --    < > = values in this interval not displayed.   BMET:  Recent Labs    07/18/23 1531 07/18/23 1532 07/19/23 0358 07/19/23 0402  NA 133*   < > 136 135  K 4.1   < > 4.6 4.6  CL 98  --  101  --   CO2 21*  --  23  --   GLUCOSE 159*  --  146*  --   BUN 66*  --  93*  --   CREATININE 3.85*  --  5.67*  --    CALCIUM 8.4*  --  8.1*  --    < > = values in this interval not displayed.    PT/INR:  Recent Labs    07/19/23 0358  LABPROT 15.3*  INR 1.2   ABG    Component Value Date/Time   PHART 7.256 (L) 07/19/2023 0402   HCO3 23.0 07/19/2023 0402   TCO2 24 07/19/2023 0402   ACIDBASEDEF 4.0 (H) 07/19/2023 0402   O2SAT 91 07/19/2023 0402   CBG (last 3)  Recent Labs    07/19/23 0034 07/19/23 0401 07/19/23 0745  GLUCAP 209* 155* 118*    Assessment/Plan: S/P Procedure(s) (LRB): DECANNULATION FOR  ECMO (Extracorporeal Membrane Oxygenation) (N/A) EMBOLECTOMY Left Femoral, Popliteal and iliac arterys,  Repair Left common femoral artery. (Left)  Continue Impella support per AHF and CCM.   LOS: 10 days    Aaron [name] 07/19/2023

## 2023-07-19 NOTE — Progress Notes (Signed)
 Advanced Heart Failure Rounding Note  Cardiologist: Christell Constant, MD  Chief Complaint: V-A ECMO  Subjective:    Admitted 2/26 with worsening shortness of breath, chest pain, atrial fibrillation with RVR. Decompensated 2/27 with IVF, nodal blockade with subsequent respiratory arrest, ROSC achieved after ~16 minute down time Femoral VA ecmo cannulation 2/27 with Impella CP vent  - CVP 8, PA 60/18 - +500cc thus far today   Objective:   Weight Range: 88.8 kg Body mass index is 23.83 kg/m.   Vital Signs:   Temp:  [98.6 F (37 C)-102 F (38.9 C)] 99.9 F (37.7 C) (03/08 0803) Pulse Rate:  [64-133] 82 (03/08 0803) Resp:  [17-30] 30 (03/08 0803) BP: (141)/(71) 141/71 (03/07 1542) SpO2:  [91 %-100 %] 97 % (03/08 0803) Arterial Line BP: (81-173)/(39-88) 103/44 (03/08 0803) FiO2 (%):  [50 %-100 %] 70 % (03/08 0729) Weight:  [88.8 kg] 88.8 kg (03/08 0600) Last BM Date : 07/18/23  Weight change: Filed Weights   07/17/23 0500 07/18/23 0600 07/19/23 0600  Weight: (S) 78.3 kg 79.1 kg 88.8 kg    Intake/Output:   Intake/Output Summary (Last 24 hours) at 07/19/2023 0932 Last data filed at 07/19/2023 0803 Gross per 24 hour  Intake 5103.75 ml  Output 1064 ml  Net 4039.75 ml      Physical Exam    General: intubated/sedated Lungs: mechanical lung sounds CV: RRR;  Abdomen: soft Ext: distal pulses intact; left femoral artery decannulation site without bleeding or hematoma Neurologic: sedated   Telemetry   NSR 80s Labs    CBC Recent Labs    07/18/23 1531 07/18/23 1532 07/19/23 0347 07/19/23 0402  WBC 26.1*  --  32.6*  --   HGB 9.0*   < > 8.3* 8.5*  HCT 27.2*   < > 25.1* 25.0*  MCV 86.6  --  88.4  --   PLT 168  --  188  --    < > = values in this interval not displayed.   Basic Metabolic Panel Recent Labs    78/29/56 0351 07/18/23 0356 07/18/23 1531 07/18/23 1532 07/19/23 0358 07/19/23 0402  NA 138  137   < > 133*   < > 136 135  K 4.2  4.1    < > 4.1   < > 4.6 4.6  CL 102  101   < > 98  --  101  --   CO2 24  24  --  21*  --  23  --   GLUCOSE 145*  142*   < > 159*  --  146*  --   BUN 68*  68*   < > 66*  --  93*  --   CREATININE 3.59*  3.64*   < > 3.85*  --  5.67*  --   CALCIUM 8.3*  8.3*  --  8.4*  --  8.1*  --   MG 2.8*  --   --   --  2.6*  --   PHOS 2.4*  --  2.7  --  3.4  --    < > = values in this interval not displayed.   Liver Function Tests Recent Labs    07/18/23 0351 07/18/23 1531 07/19/23 0358  AST 48*  --  49*  ALT 25  --  20  ALKPHOS 84  --  97  BILITOT 1.6*  --  1.8*  PROT 6.6  --  6.7  ALBUMIN 2.9*  2.9* 3.3* 2.8*  2.8*  BNP: BNP (last 3 results) Recent Labs    07/09/23 1934 07/10/23 1224  BNP 703.7* 980.5*   Thyroid Function Tests No results for input(s): "TSH", "T4TOTAL", "T3FREE", "THYROIDAB" in the last 72 hours.  Invalid input(s): "FREET3"    Medications:     Scheduled Medications:  acetaminophen  650 mg Per Tube Q6H   aspirin  81 mg Oral Daily   Chlorhexidine Gluconate Cloth  6 each Topical Daily   clonazePAM  1 mg Oral BID   docusate  100 mg Oral BID   feeding supplement (PROSource TF20)  60 mL Per Tube TID   fentaNYL (SUBLIMAZE) injection  50 mcg Intravenous Once   folic acid  1 mg Oral Daily   insulin aspart  0-20 Units Subcutaneous Q4H   insulin aspart  5 Units Subcutaneous Q4H   methimazole  10 mg Oral Q8H   multivitamin  1 tablet Oral QHS   mouth rinse  15 mL Mouth Rinse Q2H   oxyCODONE  5 mg Oral Q6H   pantoprazole (PROTONIX) IV  40 mg Intravenous QHS   polyethylene glycol  17 g Oral Daily   sodium chloride flush  3 mL Intravenous Q12H   thiamine  100 mg Oral Daily   Or   thiamine  100 mg Intravenous Daily    Infusions:  amiodarone 60 mg/hr (07/19/23 0807)   amiodarone     dexmedetomidine (PRECEDEX) IV infusion 0.4 mcg/kg/hr (07/19/23 0807)   feeding supplement (PIVOT 1.5 CAL) 65 mL/hr at 07/19/23 0807   heparin 800 Units/hr (07/19/23 0807)    HYDROmorphone 8 mg/hr (07/19/23 0807)   lidocaine 1 mg/min (07/19/23 0807)   meropenem (MERREM) IV Stopped (07/19/23 4540)   midazolam 10 mg/hr (07/19/23 0807)   milrinone 0.25 mcg/kg/min (07/19/23 0807)   norepinephrine (LEVOPHED) Adult infusion 9 mcg/min (07/19/23 0807)   PrismaSol BGK 2/3.5 Stopped (07/18/23 2202)   prismasol BGK 4/2.5 Stopped (07/18/23 2131)   prismasol BGK 4/2.5 Stopped (07/18/23 2202)   propofol (DIPRIVAN) infusion 45 mcg/kg/min (07/19/23 0807)   sodium bicarbonate 25 mEq (Impella PURGE) in dextrose 5 % 1000 mL bag 5.8 mL/hr at 07/13/23 0248   vancomycin Stopped (07/18/23 1453)   vasopressin 0.02 Units/min (07/19/23 0807)    PRN Medications: heparin, HYDROmorphone, midazolam, mouth rinse    Patient Profile   Patient with a history of Grave's disease, severe primary mitral regurgitation who presented with chest pain and segmental PE. Subsequent progressed to SCAI Stage E shock requiring VA ECMO cannulation on 2/27.   Assessment/Plan   SCAI Stage E Cardiogenic shock - Suspect hypoxic respiratory failure driven with segmental PE on top of severe MR, nodal blockage, and thyrotoxicosis - Down time ~ 20 minutes, good mental status confirmed post code - We performed a mini-turn down on 07/15/23 at bedside; ECMO flows were reduced in a stepwise pattern by 0.5LPM to a goal of 2.5LPM. During this time, impella 5.5 flows increased to maintain net even flows. With reduction of ECMO flows, patient had onset of frequent PVCs/NSVT requiring amio boluses. In addition, despite increased LV unloading patient continued to have severe 4+ MR with progressive RV failure on bedside TTE.  - Underwent successful TEER on 07/16/23 with placement of x3 clips and improvement in MR from severe to mild. Mean mitral gradient of 3-62mmHg.  - Decannulated from V-A ECMO by Dr. Karin Lieu on 07/19/23 with vascular cut down and primary repair of left femoral artery.  - Repositioned and sutured swan this AM;  PA 60/20 on nitric  30PPM, milrinone 0.9mcg/kg/min, levophed . Impella at p4 flowing 2LPM, PCWP 10-12.  - Will plan to wean amio to 30mg /hr; transition lidocaine to mexilitine and start waking up patient.  - Repeat COOX pending.  - Repeat TTE tomorrow.  - Febrile today to 102; will continue antibiotics. Extensive discussion with pharmD & CCM today.   Severe mitral valve regurgitation - Noted on previous echocardiogram - Suspect contributing to ongoing shock - ECPella for management currently - Now s/p TEER on 07/16/23; improvement in MR from severe to mild.  - No murmur on exam  Pulmonary hypertension - PA pressures reached 80s/30s with PCWP of ~10, TPG of ~26mmHg suggestive of PVR >5 on 07/17/22 - PA 58/19; continue nitric. Once extubated will transition to nitric via HFNC. Overall observational data is not supportive with the use of sildenafil in fixed PH secondary to MS/MR.   Anemia - LDH continues to improve - Transfuse to maintain Hgb >8  Acute renal failure/hyperkalemia - Appreciate nephrology consult - Will replace dialysis catheter today; discussed with CCM.   Thyrotoxicosis - Was not taking medications as they made him feel poorly - Suspect underlying low output heart failure due to mitral valve disease - Methimazole decreased to 10mg  TID - TSH below detectable limit; T4 0.54 07/14/23,  - Repeat T4 of 0.43; discussed with pharmD.  - Discontinue steroids.   Atrial fibrillation - Present on arrival, in the setting of PE and thyroid disease - sinus rhythm today.  - decrease amio got 30mg /hr  Pulmonary embolism:  - Segmental without right heart strain - Bivalrudin as above  CAD - Prior embolic infarct in the LAD territory with corresponding LGE on CMR - Lifelong anticoagulation  Sedation - Fentanyl, precedex - Versed as needed - Spot EEG without evidence of seizure - Notably minimal alcohol use after discussion with fiance.   Medication concerns reviewed with  patient and pharmacy team. Barriers identified: antibiotic dosing, CRRT  Length of Stay: 10  Arhum Peeples, DO  07/19/2023, 9:32 AM  Advanced Heart Failure Team Pager [phone] (M-F; 7a - 5p)  Please contact [facility] for night-coverage after hours (5p -7a ) and weekends on [email or web address]  CRITICAL CARE Performed by: Dorthula Nettles   Total critical care time: 60 minutes  Critical care time was exclusive of separately billable procedures and treating other patients.  Critical care was necessary to treat or prevent imminent or life-threatening deterioration.  Critical care was time spent personally by me on the following activities: development of treatment plan with patient and/or surrogate as well as nursing, discussions with consultants, evaluation of patient's response to treatment, examination of patient, obtaining history from patient or surrogate, ordering and performing treatments and interventions, ordering and review of laboratory studies, ordering and review of radiographic studies, pulse oximetry and re-evaluation of patient's condition.

## 2023-07-19 NOTE — Procedures (Signed)
 Central Venous Catheter Insertion Procedure Note  BASIL BUFFIN  [id number]  1986/04/23  Date:07/19/23  Time:10:59 AM   Provider Performing:Jovaughn Wojtaszek Salena Saner Katrinka Blazing   Procedure: Insertion of Non-tunneled Central Venous Catheter(36556)with US guidance (02725)    Indication(s) Hemodialysis  Consent Risks of the procedure as well as the alternatives and risks of each were explained to the patient and/or caregiver.  Consent for the procedure was obtained and is signed in the bedside chart  Anesthesia Topical only with 1% lidocaine   Timeout Verified patient identification, verified procedure, site/side was marked, verified correct patient position, special equipment/implants available, medications/allergies/relevant history reviewed, required imaging and test results available.  Sterile Technique Maximal sterile technique including full sterile barrier drape, hand hygiene, sterile gown, sterile gloves, mask, hair covering, sterile ultrasound probe cover (if used).  Procedure Description Area of catheter insertion was cleaned with chlorhexidine and draped in sterile fashion.   With real-time ultrasound guidance a HD catheter was placed into the left subclavian vein.  Nonpulsatile blood flow and easy flushing noted in all ports.  The catheter was sutured in place and sterile dressing applied.  Complications/Tolerance None; patient tolerated the procedure well. Chest X-ray is ordered to verify placement for internal jugular or subclavian cannulation.  Chest x-ray is not ordered for femoral cannulation.  EBL Minimal  Specimen(s) None

## 2023-07-19 NOTE — Progress Notes (Signed)
 Postop day 1 ECMO decannulation  Palpable pulses of left foot.  No signs of hematoma in the right groin.  VAC remains to suction Can resume therapeutic anticoagulation.   Will remain in place for 7 days.  Plan will be removal this Friday.   Victorino Sparrow MD

## 2023-07-20 ENCOUNTER — Inpatient Hospital Stay (HOSPITAL_COMMUNITY)

## 2023-07-20 DIAGNOSIS — I428 Other cardiomyopathies: Secondary | ICD-10-CM | POA: Diagnosis not present

## 2023-07-20 DIAGNOSIS — I2699 Other pulmonary embolism without acute cor pulmonale: Secondary | ICD-10-CM | POA: Diagnosis not present

## 2023-07-20 DIAGNOSIS — Z1152 Encounter for screening for COVID-19: Secondary | ICD-10-CM | POA: Diagnosis not present

## 2023-07-20 DIAGNOSIS — R079 Chest pain, unspecified: Secondary | ICD-10-CM | POA: Diagnosis not present

## 2023-07-20 DIAGNOSIS — R000 Tachycardia, unspecified: Secondary | ICD-10-CM | POA: Diagnosis not present

## 2023-07-20 DIAGNOSIS — I462 Cardiac arrest due to underlying cardiac condition: Secondary | ICD-10-CM | POA: Diagnosis not present

## 2023-07-20 DIAGNOSIS — R918 Other nonspecific abnormal finding of lung field: Secondary | ICD-10-CM | POA: Diagnosis not present

## 2023-07-20 DIAGNOSIS — Z4682 Encounter for fitting and adjustment of non-vascular catheter: Secondary | ICD-10-CM | POA: Diagnosis not present

## 2023-07-20 DIAGNOSIS — Z006 Encounter for examination for normal comparison and control in clinical research program: Secondary | ICD-10-CM | POA: Diagnosis not present

## 2023-07-20 DIAGNOSIS — I63441 Cerebral infarction due to embolism of right cerebellar artery: Secondary | ICD-10-CM | POA: Diagnosis not present

## 2023-07-20 DIAGNOSIS — N179 Acute kidney failure, unspecified: Secondary | ICD-10-CM | POA: Diagnosis not present

## 2023-07-20 DIAGNOSIS — I5043 Acute on chronic combined systolic (congestive) and diastolic (congestive) heart failure: Secondary | ICD-10-CM | POA: Diagnosis not present

## 2023-07-20 DIAGNOSIS — G9340 Encephalopathy, unspecified: Secondary | ICD-10-CM | POA: Diagnosis not present

## 2023-07-20 DIAGNOSIS — E872 Acidosis, unspecified: Secondary | ICD-10-CM | POA: Diagnosis not present

## 2023-07-20 DIAGNOSIS — I272 Pulmonary hypertension, unspecified: Secondary | ICD-10-CM | POA: Diagnosis not present

## 2023-07-20 DIAGNOSIS — J9601 Acute respiratory failure with hypoxia: Secondary | ICD-10-CM | POA: Diagnosis not present

## 2023-07-20 DIAGNOSIS — R509 Fever, unspecified: Secondary | ICD-10-CM | POA: Diagnosis not present

## 2023-07-20 DIAGNOSIS — E0590 Thyrotoxicosis, unspecified without thyrotoxic crisis or storm: Secondary | ICD-10-CM | POA: Diagnosis not present

## 2023-07-20 DIAGNOSIS — I469 Cardiac arrest, cause unspecified: Secondary | ICD-10-CM | POA: Diagnosis not present

## 2023-07-20 DIAGNOSIS — J80 Acute respiratory distress syndrome: Secondary | ICD-10-CM | POA: Diagnosis not present

## 2023-07-20 DIAGNOSIS — K7200 Acute and subacute hepatic failure without coma: Secondary | ICD-10-CM | POA: Diagnosis not present

## 2023-07-20 DIAGNOSIS — E875 Hyperkalemia: Secondary | ICD-10-CM | POA: Diagnosis not present

## 2023-07-20 DIAGNOSIS — N170 Acute kidney failure with tubular necrosis: Secondary | ICD-10-CM | POA: Diagnosis not present

## 2023-07-20 DIAGNOSIS — E43 Unspecified severe protein-calorie malnutrition: Secondary | ICD-10-CM | POA: Diagnosis not present

## 2023-07-20 DIAGNOSIS — I4901 Ventricular fibrillation: Secondary | ICD-10-CM | POA: Diagnosis not present

## 2023-07-20 DIAGNOSIS — I10 Essential (primary) hypertension: Secondary | ICD-10-CM | POA: Diagnosis not present

## 2023-07-20 DIAGNOSIS — R0602 Shortness of breath: Secondary | ICD-10-CM | POA: Diagnosis not present

## 2023-07-20 DIAGNOSIS — J9809 Other diseases of bronchus, not elsewhere classified: Secondary | ICD-10-CM | POA: Diagnosis not present

## 2023-07-20 DIAGNOSIS — R570 Cardiogenic shock: Secondary | ICD-10-CM | POA: Diagnosis not present

## 2023-07-20 LAB — POCT I-STAT 7, (LYTES, BLD GAS, ICA,H+H)
Acid-base deficit: 3 mmol/L — ABNORMAL HIGH (ref 0.0–2.0)
Acid-base deficit: 2 mmol/L (ref 0.0–2.0)
Bicarbonate: 21.5 mmol/L (ref 20.0–28.0)
Bicarbonate: 21.5 mmol/L (ref 20.0–28.0)
Calcium, Ion: 1.09 mmol/L — ABNORMAL LOW (ref 1.15–1.40)
Calcium, Ion: 1.08 mmol/L — ABNORMAL LOW (ref 1.15–1.40)
HCT: 22 % — ABNORMAL LOW (ref 39.0–52.0)
HCT: 22 % — ABNORMAL LOW (ref 39.0–52.0)
Hemoglobin: 7.5 g/dL — ABNORMAL LOW (ref 13.0–17.0)
Hemoglobin: 7.5 g/dL — ABNORMAL LOW (ref 13.0–17.0)
O2 Saturation: 100 %
O2 Saturation: 100 %
Patient temperature: 37.3
Patient temperature: 36.8
Potassium: 3.6 mmol/L (ref 3.5–5.1)
Potassium: 4.1 mmol/L (ref 3.5–5.1)
Sodium: 133 mmol/L — ABNORMAL LOW (ref 135–145)
Sodium: 134 mmol/L — ABNORMAL LOW (ref 135–145)
TCO2: 23 mmol/L (ref 22–32)
TCO2: 23 mmol/L (ref 22–32)
pCO2 arterial: 37.2 mmHg (ref 32–48)
pCO2 arterial: 32.5 mmHg (ref 32–48)
pH, Arterial: 7.37 (ref 7.35–7.45)
pH, Arterial: 7.429 (ref 7.35–7.45)
pO2, Arterial: 186 mmHg — ABNORMAL HIGH (ref 83–108)
pO2, Arterial: 168 mmHg — ABNORMAL HIGH (ref 83–108)

## 2023-07-20 LAB — BASIC METABOLIC PANEL
Anion gap: 13 (ref 5–15)
BUN: 81 mg/dL — ABNORMAL HIGH (ref 6–20)
CO2: 21 mmol/L — ABNORMAL LOW (ref 22–32)
Calcium: 7.9 mg/dL — ABNORMAL LOW (ref 8.9–10.3)
Chloride: 100 mmol/L (ref 98–111)
Creatinine, Ser: 4.88 mg/dL — ABNORMAL HIGH (ref 0.61–1.24)
GFR, Estimated: 15 mL/min — ABNORMAL LOW (ref >60)
Glucose, Bld: 135 mg/dL — ABNORMAL HIGH (ref 70–99)
Potassium: 3.6 mmol/L (ref 3.5–5.1)
Sodium: 134 mmol/L — ABNORMAL LOW (ref 135–145)

## 2023-07-20 LAB — HEPATIC FUNCTION PANEL
ALT: 16 U/L (ref 0–44)
AST: 44 U/L — ABNORMAL HIGH (ref 15–41)
Albumin: 2.3 g/dL — ABNORMAL LOW (ref 3.5–5.0)
Alkaline Phosphatase: 102 U/L (ref 38–126)
Bilirubin, Direct: 0.8 mg/dL — ABNORMAL HIGH (ref 0.0–0.2)
Indirect Bilirubin: 1.2 mg/dL — ABNORMAL HIGH (ref 0.3–0.9)
Total Bilirubin: 2 mg/dL — ABNORMAL HIGH (ref 0.0–1.2)
Total Protein: 6.3 g/dL — ABNORMAL LOW (ref 6.5–8.1)

## 2023-07-20 LAB — GLUCOSE, CAPILLARY
Glucose-Capillary: 118 mg/dL — ABNORMAL HIGH (ref 70–99)
Glucose-Capillary: 131 mg/dL — ABNORMAL HIGH (ref 70–99)
Glucose-Capillary: 134 mg/dL — ABNORMAL HIGH (ref 70–99)
Glucose-Capillary: 139 mg/dL — ABNORMAL HIGH (ref 70–99)
Glucose-Capillary: 144 mg/dL — ABNORMAL HIGH (ref 70–99)
Glucose-Capillary: 154 mg/dL — ABNORMAL HIGH (ref 70–99)
Glucose-Capillary: 93 mg/dL (ref 70–99)

## 2023-07-20 LAB — RENAL FUNCTION PANEL
Albumin: 2.2 g/dL — ABNORMAL LOW (ref 3.5–5.0)
Anion gap: 11 (ref 5–15)
BUN: 64 mg/dL — ABNORMAL HIGH (ref 6–20)
CO2: 21 mmol/L — ABNORMAL LOW (ref 22–32)
Calcium: 7.9 mg/dL — ABNORMAL LOW (ref 8.9–10.3)
Chloride: 101 mmol/L (ref 98–111)
Creatinine, Ser: 4.05 mg/dL — ABNORMAL HIGH (ref 0.61–1.24)
GFR, Estimated: 18 mL/min — ABNORMAL LOW (ref >60)
Glucose, Bld: 131 mg/dL — ABNORMAL HIGH (ref 70–99)
Phosphorus: 2.8 mg/dL (ref 2.5–4.6)
Potassium: 3.7 mmol/L (ref 3.5–5.1)
Sodium: 133 mmol/L — ABNORMAL LOW (ref 135–145)

## 2023-07-20 LAB — COOXEMETRY PANEL
Carboxyhemoglobin: 2 % — ABNORMAL HIGH (ref 0.5–1.5)
Methemoglobin: 2.4 % — ABNORMAL HIGH (ref 0.0–1.5)
O2 Saturation: 74.7 %
Total hemoglobin: 7 g/dL — ABNORMAL LOW (ref 12.0–16.0)

## 2023-07-20 LAB — CBC
HCT: 22.6 % — ABNORMAL LOW (ref 39.0–52.0)
Hemoglobin: 7.6 g/dL — ABNORMAL LOW (ref 13.0–17.0)
MCH: 28.9 pg (ref 26.0–34.0)
MCHC: 33.6 g/dL (ref 30.0–36.0)
MCV: 85.9 fL (ref 80.0–100.0)
Platelets: 214 10*3/uL (ref 150–400)
RBC: 2.63 MIL/uL — ABNORMAL LOW (ref 4.22–5.81)
RDW: 20.1 % — ABNORMAL HIGH (ref 11.5–15.5)
WBC: 21.8 10*3/uL — ABNORMAL HIGH (ref 4.0–10.5)
nRBC: 0 % (ref 0.0–0.2)

## 2023-07-20 LAB — MAGNESIUM: Magnesium: 2.3 mg/dL (ref 1.7–2.4)

## 2023-07-20 LAB — LACTATE DEHYDROGENASE: LDH: 930 U/L — ABNORMAL HIGH (ref 98–192)

## 2023-07-20 LAB — FIBRINOGEN: Fibrinogen: 735 mg/dL — ABNORMAL HIGH (ref 210–475)

## 2023-07-20 LAB — PHOSPHORUS: Phosphorus: 3.6 mg/dL (ref 2.5–4.6)

## 2023-07-20 LAB — HEPARIN LEVEL (UNFRACTIONATED)
Heparin Unfractionated: <0.1 [IU]/mL — ABNORMAL LOW (ref 0.30–0.70)
Heparin Unfractionated: <0.1 [IU]/mL — ABNORMAL LOW (ref 0.30–0.70)

## 2023-07-20 LAB — TRIGLYCERIDES: Triglycerides: 83 mg/dL (ref <150)

## 2023-07-20 LAB — APTT: aPTT: 73 s — ABNORMAL HIGH (ref 24–36)

## 2023-07-20 MED ORDER — MEXILETINE HCL 250 MG PO CAPS
250.0000 mg | ORAL_CAPSULE | Freq: Two times a day (BID) | ORAL | Status: DC
  Administered 2023-07-20 – 2023-07-26 (×11): 250 mg
  Filled 2023-07-20 (×14): qty 1

## 2023-07-20 MED ORDER — PRISMASOL BGK 4/2.5 32-4-2.5 MEQ/L EC SOLN: Status: DC

## 2023-07-20 NOTE — Progress Notes (Signed)
  Echocardiogram 2D Echocardiogram has been performed.  Aaron [name] 07/20/2023, 3:59 PM

## 2023-07-20 NOTE — Progress Notes (Signed)
 PHARMACY - ANTICOAGULATION CONSULT NOTE  Pharmacy Consult for Heparin  Indication: pulmonary embolus, Impella, s/p ECMO decannulation   No Known Allergies  Patient Measurements: Height: 6\' 4"  (193 cm) Weight: 90.3 kg (199 lb 1.2 oz) IBW/kg (Calculated) : 86.8 Heparin Dosing Weight: n/a  Vital Signs: Temp: 99.3 F (37.4 C) (03/09 0410) Temp Source: Core (03/09 0400) Pulse Rate: 94 (03/09 0410)  Labs: Recent Labs    07/18/23 0351 07/18/23 0356 07/18/23 1100 07/18/23 1227 07/18/23 1531 07/18/23 1532 07/19/23 0035 07/19/23 0347 07/19/23 0358 07/19/23 0402 07/19/23 1152 07/19/23 1332 07/19/23 1608 07/19/23 1620 07/19/23 1930 07/20/23 0501  HGB 7.7*   < > 8.5*   < > 9.0*   < >  --  8.3*  --    < > 7.8*  --   --  8.5*  --  7.6*  HCT 22.7*   < > 25.0*   < > 27.2*   < >  --  25.1*  --    < > 23.0*  --   --  25.0*  --  22.6*  PLT 119*  --   --   --  168  --   --  188  --   --   --   --   --   --   --  214  APTT 75*  --  74*  --  67*  --  49*  --   --   --   --   --   --   --   --   --   LABPROT 17.0*  --   --   --   --   --   --   --  15.3*  --   --   --   --   --   --   --   INR 1.4*  --   --   --   --   --   --   --  1.2  --   --   --   --   --   --   --   HEPARINUNFRC  --   --   --   --   --   --  <0.10*  --   --    < >  --  <0.10*  --   --  <0.10* <0.10*  CREATININE 3.59*  3.64*  --   --    < > 3.85*  --   --   --  5.67*  --   --   --  5.65*  --   --   --    < > = values in this interval not displayed.    Estimated Creatinine Clearance: 21.8 mL/min (A) (by C-G formula based on SCr of 5.65 mg/dL (H)).   Medical History: Past Medical History:  Diagnosis Date   Atrial fibrillation [facility])    Hyperthyroidism    Mitral regurgitation    NSTEMI (non-ST elevated myocardial infarction) [facility])     Assessment: 38 yo M with bilateral PE s/p TNK (39/27 @1156 ) now on [place] ECMO + Impella. Pharmacy consulted for bivalirudin for anticoagulation.   S/p impella CP >5.5 3/3 -  bivalirudin was previously held with oozing at insertion site but restart 3/4. Patient underwent mitraclip 3/5.  aPTT 67 is therapeutic on bivalirudin@0 .02 mg/kg/hr. Remains on CRRT. Patient is getting decannulated 3/7.   3/9 AM update:  Pt is s/p ECMO decannulation 3/7 PM Heparin level is undetectable  Impella remains  in place  Goal of Therapy:  Heparin level 0.3-0.5 units/mL Monitor platelets by anticoagulation protocol: Yes   Plan:  Inc heparin to 1400 units/hr Heparin level in 6 hours  Abran Duke, PharmD, BCPS Clinical Pharmacist Phone: [phone]

## 2023-07-20 NOTE — Progress Notes (Signed)
 2 Days Post-Op Procedure(s) (LRB): DECANNULATION FOR  ECMO (Extracorporeal Membrane Oxygenation) (N/A) EMBOLECTOMY Left Femoral, Popliteal and iliac arterys,  Repair Left common femoral artery. (Left) Subjective:  Weaning sedation on vent.  Impella P4, milrinone 0.25, NE 10, vaso 0.04  CRRT  Tmax 99.5 since yesterday am.  Objective: Vital signs in last 24 hours: Temp:  [95 F (35 C)-99.5 F (37.5 C)] 97.9 F (36.6 C) (03/09 1259) Pulse Rate:  [57-108] 62 (03/09 1259) Cardiac Rhythm: Normal sinus rhythm (03/09 1031) Resp:  [22-30] 30 (03/09 1259) SpO2:  [100 %] 100 % (03/09 1259) Arterial Line BP: (63-210)/(35-80) 109/55 (03/09 1259) FiO2 (%):  [50 %] 50 % (03/09 0300) Weight:  [90.3 kg] 90.3 kg (03/09 0500)  Hemodynamic parameters for last 24 hours: PAP: (35-77)/(10-25) 47/20 CVP:  [1 mmHg-12 mmHg] 10 mmHg CO:  [9.2 L/min-10.3 L/min] 9.2 L/min CI:  [4 L/min/m2-4.5 L/min/m2] 4 L/min/m2  Intake/Output from previous day: 03/08 0701 - 03/09 0700 In: 6374.7 [I.V.:3155.9; NG/GT:1873.9; IV Piggyback:1073.5] Out: 4654.1 [Stool:1105] Intake/Output this shift: Total I/O In: 1573.9 [I.V.:795.7; Other:63; NG/GT:415; IV Piggyback:300.2] Out: 1412 [Stool:35]  General appearance: sedated on vent Wound: Impella incision intact with sign of infection.   Lab Results: Recent Labs    07/19/23 0347 07/19/23 0402 07/20/23 0443 07/20/23 0501  WBC 32.6*  --   --  21.8*  HGB 8.3*   < > 7.5* 7.6*  HCT 25.1*   < > 22.0* 22.6*  PLT 188  --   --  214   < > = values in this interval not displayed.   BMET:  Recent Labs    07/19/23 1608 07/19/23 1620 07/20/23 0443 07/20/23 0501  NA 134*   < > 133* 134*  K 3.6   < > 3.6 3.6  CL 98  --   --  100  CO2 20*  --   --  21*  GLUCOSE 140*  --   --  135*  BUN 99*  --   --  81*  CREATININE 5.65*  --   --  4.88*  CALCIUM 8.1*  --   --  7.9*   < > = values in this interval not displayed.    PT/INR:  Recent Labs    07/19/23 0358   LABPROT 15.3*  INR 1.2   ABG    Component Value Date/Time   PHART 7.370 07/20/2023 0443   HCO3 21.5 07/20/2023 0443   TCO2 23 07/20/2023 0443   ACIDBASEDEF 3.0 (H) 07/20/2023 0443   O2SAT 74.7 07/20/2023 1240   CBG (last 3)  Recent Labs    07/20/23 0520 07/20/23 0749 07/20/23 1229  GLUCAP 134* 93 144*    Assessment/Plan: S/P Procedure(s) (LRB): DECANNULATION FOR  ECMO (Extracorporeal Membrane Oxygenation) (N/A) EMBOLECTOMY Left Femoral, Popliteal and iliac arterys,  Repair Left common femoral artery. (Left)  Continue Impella support per AHF and CCM teams.   LOS: 11 days    [name] 07/20/2023

## 2023-07-20 NOTE — Progress Notes (Signed)
 NAME:  Aaron [name], MRN:  [id number], DOB:  1985/09/02, LOS: 11 ADMISSION DATE:  07/09/2023, CONSULTATION DATE:  07/11/2023 REFERRING MD: Clearnce Hasten, CHIEF COMPLAINT: Status post cardiac arrest  History of Present Illness:  38 year old male with hypothyroidism, paroxysmal A-fib, severe mitral regurgitation who presented to [facility] with chest pain and shortness of breath for few days associated cough, nausea and diarrhea.  In the emergency department he was diagnosed with PE, was started on IV heparin.  He takes beta-blocker, steroid and methimazole for hyperthyroidism.  On 2/27 patient rapidly deteriorated developed V. tach/V-fib cardiac arrest patient was intubated after 40 minutes of CPR, cardiology was consulted patient was placed on VA ECMO and [facility]  Pertinent  Medical History   Past Medical History:  Diagnosis Date   Atrial fibrillation [facility])    Hyperthyroidism    Mitral regurgitation    NSTEMI (non-ST elevated myocardial infarction) (HCC)     Significant Hospital Events: Including procedures, antibiotic start and stop dates in addition to other pertinent events   3/5 salvage TEER (transcatheter edge to edge mitral valve repair 3/7 weaned off VA  Interim History / Subjective:  Tolerated decannulation Issues with HTN, agitation, fevers overnight, prop started in addition to dilaudid, versed gtt  Objective   Blood pressure (!) 141/71, pulse 69, temperature 98.8 F (37.1 C), resp. rate (!) 30, height 6\' 4"  (1.93 m), weight 90.3 kg, SpO2 100%. PAP: (38-87)/(11-43) 48/17 CVP:  [1 mmHg-25 mmHg] 7 mmHg CO:  [10.3 L/min] 10.3 L/min CI:  [4.5 L/min/m2] 4.5 L/min/m2  Vent Mode: PRVC FiO2 (%):  [50 %-70 %] 50 % Set Rate:  [30 bmp] 30 bmp Vt Set:  [590 mL] 590 mL PEEP:  [5 cmH20-10 cmH20] 10 cmH20 Plateau Pressure:  [24 cmH20-26 cmH20] 26 cmH20   Intake/Output Summary (Last 24 hours) at 07/20/2023 0948 Last data filed at 07/20/2023  0900 Gross per 24 hour  Intake 6126.01 ml  Output 5019.1 ml  Net 1106.91 ml   Filed Weights   07/18/23 0600 07/19/23 0600 07/20/23 0500  Weight: 79.1 kg 88.8 kg 90.3 kg    Examination: Sedated on vent +anasarca Lungs diminished bases, ongoing brown output from ETT RASS -5 Ext warm Impella site looks fine Groin soft  Labs, imaging reviewed, improved LUL aeration post bronch GNR on gram stain BAL  Resolved Hospital Problem list   Lactic acidosis, resolved Refractory hyperkalemia, improving  Assessment & Plan:  Baseline severe mitral insufficiency with superimposed RV>LV shock state culminating in prolonged IHCA s/p VA ECMO 2/27- salvage TEER 3/5, weaned from [place] to impella 3/7. Post arrest encephalopathy- following commands a few days ago per nursing, neg head imaging Acute renal failure- now requiring CRRT Coagulopathy and hemolysis- improving Thyrotoxicosis- on methimazole, stress steroids; off on 3/8 Shock liver- improving Ongoing severe multifactorial pulmonary HTN- remains on iNO 3/6 worsening fevers, rising WBC- vanc added 3/6; mica x 1 3/8;  Left lung mucus plugging- improved after bronch 3/8; apparently per wife he always coughs up brown stuff HTN/Tachycardia- worsened by fevers, ablated  Bival per PharmD Sedation for vent synchrony; pressors seem to be a byproduct of propofol F/u tracheal aspirate, okay to hold micafungin iNO wean today then potential SAT/SBT, will d/w AHF Would prefer to avoid trach while on impella  Best Practice (right click and "Reselect all SmartList Selections" daily)   Diet/type: NPO TF DVT prophylaxis systemic bival Pressure ulcer(s): N/A GI prophylaxis: PPI Lines: Central line, Arterial Line, and  yes and it is still needed.   Foley:  Yes, and it is still needed Code Status:  full code Last date of multidisciplinary goals of care discussion [Per primary team]   32 min cc time Myrla Halsted MD PCCM

## 2023-07-20 NOTE — Progress Notes (Addendum)
 Advanced Heart Failure Rounding Note  Cardiologist: Christell Constant, MD  Chief Complaint: V-A ECMO  Subjective:    Admitted 2/26 with worsening shortness of breath, chest pain, atrial fibrillation with RVR. Decompensated 2/27 with IVF, nodal blockade with subsequent respiratory arrest, ROSC achieved after ~16 minute down time Femoral VA ecmo cannulation 2/27 with Impella CP vent  - CVP 8, PA 50s/15 - +1.7L q 24h - Impella P4 at 2LPM on levophed , milrinone 0.30mcg/kg/min, vasopressin  0.03   Objective:   Weight Range: 90.3 kg Body mass index is 24.23 kg/m.   Vital Signs:   Temp:  [97.9 F (36.6 C)-99.5 F (37.5 C)] 98.8 F (37.1 C) (03/09 0700) Pulse Rate:  [57-108] 69 (03/09 0700) Resp:  [26-30] 30 (03/09 0700) SpO2:  [96 %-100 %] 100 % (03/09 0700) Arterial Line BP: (69-210)/(42-80) 140/58 (03/09 0700) FiO2 (%):  [50 %-70 %] 50 % (03/09 0300) Weight:  [90.3 kg] 90.3 kg (03/09 0500) Last BM Date : 07/19/23  Weight change: Filed Weights   07/18/23 0600 07/19/23 0600 07/20/23 0500  Weight: 79.1 kg 88.8 kg 90.3 kg    Intake/Output:   Intake/Output Summary (Last 24 hours) at 07/20/2023 0947 Last data filed at 07/20/2023 0900 Gross per 24 hour  Intake 6126.01 ml  Output 5019.1 ml  Net 1106.91 ml      Physical Exam    General: intubated/sedated Lungs: mechanical lung sounds CV: RRR;  Abdomen: soft Ext: distal pulses intact; left femoral artery decannulation site without bleeding or hematoma Neurologic: sedated   Telemetry   NSR 80s Labs    CBC Recent Labs    07/19/23 0347 07/19/23 0402 07/20/23 0443 07/20/23 0501  WBC 32.6*  --   --  21.8*  HGB 8.3*   < > 7.5* 7.6*  HCT 25.1*   < > 22.0* 22.6*  MCV 88.4  --   --  85.9  PLT 188  --   --  214   < > = values in this interval not displayed.   Basic Metabolic Panel Recent Labs    95/62/13 0358 07/19/23 0402 07/19/23 1608 07/19/23 1620 07/20/23 0443 07/20/23 0501  NA 136   <  > 134*   < > 133* 134*  K 4.6   < > 3.6   < > 3.6 3.6  CL 101  --  98  --   --  100  CO2 23  --  20*  --   --  21*  GLUCOSE 146*  --  140*  --   --  135*  BUN 93*  --  99*  --   --  81*  CREATININE 5.67*  --  5.65*  --   --  4.88*  CALCIUM 8.1*  --  8.1*  --   --  7.9*  MG 2.6*  --   --   --   --  2.3  PHOS 3.4  --  2.0*  --   --  3.6   < > = values in this interval not displayed.   Liver Function Tests Recent Labs    07/19/23 0358 07/19/23 1608 07/20/23 0501  AST 49*  --  44*  ALT 20  --  16  ALKPHOS 97  --  102  BILITOT 1.8*  --  2.0*  PROT 6.7  --  6.3*  ALBUMIN 2.8*  2.8* 2.4* 2.3*    BNP: BNP (last 3 results) Recent Labs    07/09/23 1934 07/10/23  1224  BNP 703.7* 980.5*   Thyroid Function Tests No results for input(s): "TSH", "T4TOTAL", "T3FREE", "THYROIDAB" in the last 72 hours.  Invalid input(s): "FREET3"    Medications:     Scheduled Medications:  acetaminophen  650 mg Per Tube Q6H   amiodarone  150 mg Intravenous Once   aspirin  81 mg Oral Daily   Chlorhexidine Gluconate Cloth  6 each Topical Daily   clonazePAM  1 mg Oral BID   docusate  100 mg Oral BID   feeding supplement (PROSource TF20)  60 mL Per Tube TID   fentaNYL (SUBLIMAZE) injection  50 mcg Intravenous Once   folic acid  1 mg Oral Daily   insulin aspart  0-20 Units Subcutaneous Q4H   insulin aspart  5 Units Subcutaneous Q4H   methimazole  10 mg Oral Q8H   mexiletine  250 mg Per Tube Q12H   multivitamin  1 tablet Oral QHS   mouth rinse  15 mL Mouth Rinse Q2H   oxyCODONE  5 mg Oral Q6H   pantoprazole (PROTONIX) IV  40 mg Intravenous QHS   polyethylene glycol  17 g Oral Daily   QUEtiapine  50 mg Per Tube BID   sodium chloride flush  3 mL Intravenous Q12H   thiamine  100 mg Oral Daily   Or   thiamine  100 mg Intravenous Daily    Infusions:  sodium chloride Stopped (07/19/23 1505)   amiodarone 60 mg/hr (07/20/23 0927)   dexmedetomidine (PRECEDEX) IV infusion 0.4 mcg/kg/hr  (07/20/23 0900)   feeding supplement (PIVOT 1.5 CAL) 65 mL/hr at 07/20/23 0900   furosemide     heparin 1,400 Units/hr (07/20/23 0900)   HYDROmorphone 8 mg/hr (07/20/23 0900)   lidocaine 1 mg/min (07/20/23 0900)   meropenem (MERREM) IV Stopped (07/20/23 0540)   micafungin (MYCAMINE) 100 mg in sodium chloride 0.9 % 100 mL IVPB     midazolam 8 mg/hr (07/20/23 0900)   milrinone 0.25 mcg/kg/min (07/20/23 0900)   norepinephrine (LEVOPHED) Adult infusion 16 mcg/min (07/20/23 0900)   prismasol BGK 4/2.5 1,500 mL/hr at 07/20/23 0739   prismasol BGK 4/2.5 340 mL/hr at 07/20/23 0457   prismasol BGK 4/2.5     propofol (DIPRIVAN) infusion 45 mcg/kg/min (07/20/23 0900)   sodium bicarbonate 25 mEq (Impella PURGE) in dextrose 5 % 1000 mL bag 5.8 mL/hr at 07/13/23 0248   vancomycin Stopped (07/19/23 1358)   vasopressin 0.03 Units/min (07/20/23 0900)    PRN Medications: sodium chloride, heparin, HYDROmorphone, midazolam, mouth rinse    Patient Profile   Patient with a history of Grave's disease, severe primary mitral regurgitation who presented with chest pain and segmental PE. Subsequent progressed to SCAI Stage E shock requiring VA ECMO cannulation on 2/27.   Assessment/Plan   SCAI Stage E Cardiogenic shock - Suspect hypoxic respiratory failure driven with segmental PE on top of severe MR, nodal blockage, and thyrotoxicosis - Down time ~ 20 minutes, good mental status confirmed post code - We performed a mini-turn down on 07/15/23 at bedside; ECMO flows were reduced in a stepwise pattern by 0.5LPM to a goal of 2.5LPM. During this time, impella 5.5 flows increased to maintain net even flows. With reduction of ECMO flows, patient had onset of frequent PVCs/NSVT requiring amio boluses. In addition, despite increased LV unloading patient continued to have severe 4+ MR with progressive RV failure on bedside TTE.  - Underwent successful TEER on 07/16/23 with placement of x3 clips and improvement in MR from  severe  to mild. Mean mitral gradient of 3-73mmHg.  - Decannulated from V-A ECMO by Dr. Karin Lieu on 07/19/23 with vascular cut down and primary repair of left femoral artery.  - PA 50s/15-18 on  nitric 40ppm; CVP  - Significant improvement in CXR after bronch yesterday.  Will wean nitric to 20PPM and start weaning sedation for extubation in the next 24h. Transition to HFNC with nitric after extubation.  - +1.7L yesterday; start pulling on CRRT today.   Severe mitral valve regurgitation - Noted on previous echocardiogram - Suspect contributing to ongoing shock - ECPella for management currently - Now s/p TEER on 07/16/23; improvement in MR from severe to mild.  - No murmur on exam  Pulmonary hypertension - PA pressures reached 80s/30s with PCWP of ~10, TPG of ~61mmHg suggestive of PVR >5 on 07/17/22 - PA 58/19; continue nitric. Once extubated will transition to nitric via HFNC. Overall observational data is not supportive with the use of sildenafil in fixed PH secondary to MS/MR.   VT/NSVT - Frequent runs of NSVT likely secondary to impella 5.5 position - Lidocaine gtt& amio; he had multiple runs yesterday (07/19/23) requiring increase in amio to 60mg /hr - D/C lidocaine today; start mexiletine 250 BID  ID - Fevers up to 102 yesterday w/ risein WBC ct to 32.6 on vanc/merrem; possibly SIRS after decannulation.  - Bronchoscopy with thick purulent secretions - Started on micafungin on 07/19/23 - WBC ct improved today to 22.8. - No yeast on cultures from bronch; will likely D/C micafungin.   Anemia - LDH continues to improve; 930 today.  - Hgb stable at 7.6 - Maintain Hgb > 7.5  Acute renal failure/hyperkalemia - Appreciate nephrology consult - Will replace dialysis catheter today; discussed with CCM.   Thyrotoxicosis - Was not taking medications as they made him feel poorly - Suspect underlying low output heart failure due to mitral valve disease - Methimazole decreased to 10mg  TID - TSH below  detectable limit; T4 0.54 07/14/23,  - Repeat T4 of 0.43; discussed with pharmD.  - Discontinue steroids.   Atrial fibrillation - Present on arrival, in the setting of PE and thyroid disease - sinus rhythm today.  - decrease amio got 30mg /hr  Pulmonary embolism:  - Segmental without right heart strain - Bivalrudin as above  CAD - Prior embolic infarct in the LAD territory with corresponding LGE on CMR - Lifelong anticoagulation  Sedation - Propofol/versed - weaning versed today.   Medication concerns reviewed with patient and pharmacy team. Barriers identified: antibiotic dosing, CRRT  Length of Stay: 11  Ludene Stokke, DO  07/20/2023, 9:47 AM  Advanced Heart Failure Team Pager [phone] (M-F; 7a - 5p)  Please contact [facility] for night-coverage after hours (5p -7a ) and weekends on [email or web address]  CRITICAL CARE Performed by: Dorthula Nettles   Total critical care time: 50 minutes  Critical care time was exclusive of separately billable procedures and treating other patients.  Critical care was necessary to treat or prevent imminent or life-threatening deterioration.  Critical care was time spent personally by me on the following activities: development of treatment plan with patient and/or surrogate as well as nursing, discussions with consultants, evaluation of patient's response to treatment, examination of patient, obtaining history from patient or surrogate, ordering and performing treatments and interventions, ordering and review of laboratory studies, ordering and review of radiographic studies, pulse oximetry and re-evaluation of patient's condition.

## 2023-07-20 NOTE — Progress Notes (Signed)
 Impella 5.5 noted to be 3.5cm form the aortic valve annulus and adjacent to the IVS leading to intermittent NSVT. I repositioned the impella with bedside ultrasound. The impella was advanced 2cm and torqued away from the IVS. Impella can likely be removed within the next 24H. LVEF has improved to 40% by bedside TTE.   Tanaisha Pittman 5:51 PM

## 2023-07-20 NOTE — Progress Notes (Signed)
 PHARMACY - ANTICOAGULATION CONSULT NOTE  Pharmacy Consult for Heparin  Indication: pulmonary embolus, Impella CP>5.5, s/p ECMO decannulation   No Known Allergies  Patient Measurements: Height: 6\' 4"  (193 cm) Weight: 90.3 kg (199 lb 1.2 oz) IBW/kg (Calculated) : 86.8 Heparin Dosing Weight: n/a  Vital Signs: Temp: 98.1 F (36.7 C) (03/09 1530) Temp Source: Core (03/09 1031) Pulse Rate: 63 (03/09 1530)  Labs: Recent Labs    07/18/23 0351 07/18/23 0356 07/18/23 1531 07/18/23 1532 07/19/23 0035 07/19/23 0347 07/19/23 0358 07/19/23 0402 07/19/23 1608 07/19/23 1620 07/19/23 1930 07/20/23 0443 07/20/23 0501 07/20/23 1432  HGB 7.7*   < > 9.0*   < >  --  8.3*  --    < >  --  8.5*  --  7.5* 7.6*  --   HCT 22.7*   < > 27.2*   < >  --  25.1*  --    < >  --  25.0*  --  22.0* 22.6*  --   PLT 119*  --  168  --   --  188  --   --   --   --   --   --  214  --   APTT 75*   < > 67*  --  49*  --   --   --   --   --   --   --   --  73*  LABPROT 17.0*  --   --   --   --   --  15.3*  --   --   --   --   --   --   --   INR 1.4*  --   --   --   --   --  1.2  --   --   --   --   --   --   --   HEPARINUNFRC  --   --   --   --  <0.10*  --   --    < >  --   --  <0.10*  --  <0.10* <0.10*  CREATININE 3.59*  3.64*   < > 3.85*  --   --   --  5.67*  --  5.65*  --   --   --  4.88*  --    < > = values in this interval not displayed.    Estimated Creatinine Clearance: 25.2 mL/min (A) (by C-G formula based on SCr of 4.88 mg/dL (H)).   Medical History: Past Medical History:  Diagnosis Date   Atrial fibrillation [facility])    Hyperthyroidism    Mitral regurgitation    NSTEMI (non-ST elevated myocardial infarction) [facility])     Assessment: 38 yo M with bilateral PE s/p TNK (2/27 @1156 ) now on [place] ECMO + Impella. Pharmacy consulted for bivalirudin for anticoagulation.   S/p impella CP >5.5 3/3 - bivalirudin restart 3/4. Patient underwent mitraclip 3/5, ECMO decannulation 3/7 pm  Heparin drip was started  post de-cannulation  at 500 units/hr Heparin level was undetectable  Heparin drip now  increased 14000 uts/hr and follow up heparin level remains undetectable - moved heparin drip to central line to ensure infusing correctly   cannula sites no bleeding or hematoma - of for full anticoagulation per VVS   Goal of Therapy:  Heparin level 0.3-0.5 units/mL Monitor platelets by anticoagulation protocol: Yes   Plan:  Increase  heparin to 1600 units/hr Check heparin level and cbc daily  Sodium bicarb  purge solution for Impella 5.5 Monitor closely for bleeding   Leota Sauers Pharm.D. CPP, BCPS Clinical Pharmacist [phone] 07/20/2023 4:45 PM

## 2023-07-20 NOTE — Procedures (Signed)
 Admit: 07/09/2023 LOS: 11  74M AKI likely ATN after SCA, cardiogenic shock on ECMO and Impella, AHRF/VDRF  Current CRRT Prescription: Start Date: 07/11/23 Catheter: L [place] Temp by CCM 3/8 BFR: 200 Pre Blood Pump: 340 4K DFR: 1500 4K Replacement Rate: 500 2K Goal UF: reduced, AHF assisting Anticoagulation: heparin Clotting: ~q12h  S: Left subclavian temp HD cath placed by CCM yesterday and resumed CRRT K3.6, phosphorus 3.6 Now clotting about every 12 hours  O: 03/08 0701 - 03/09 0700 In: 6374.7 [I.V.:3155.9; NG/GT:1873.9; IV Piggyback:1073.5] Out: 4654.1 [Stool:1105]  Filed Weights   07/18/23 0600 07/19/23 0600 07/20/23 0500  Weight: 79.1 kg 88.8 kg 90.3 kg    Recent Labs  Lab 07/19/23 0358 07/19/23 0402 07/19/23 1608 07/19/23 1620 07/20/23 0443 07/20/23 0501  NA 136   < > 134* 132* 133* 134*  K 4.6   < > 3.6 3.5 3.6 3.6  CL 101  --  98  --   --  100  CO2 23  --  20*  --   --  21*  GLUCOSE 146*  --  140*  --   --  135*  BUN 93*  --  99*  --   --  81*  CREATININE 5.67*  --  5.65*  --   --  4.88*  CALCIUM 8.1*  --  8.1*  --   --  7.9*  PHOS 3.4  --  2.0*  --   --  3.6   < > = values in this interval not displayed.   Recent Labs  Lab 07/18/23 1531 07/18/23 1532 07/19/23 0347 07/19/23 0402 07/19/23 1620 07/20/23 0443 07/20/23 0501  WBC 26.1*  --  32.6*  --   --   --  21.8*  HGB 9.0*   < > 8.3*   < > 8.5* 7.5* 7.6*  HCT 27.2*   < > 25.1*   < > 25.0* 22.0* 22.6*  MCV 86.6  --  88.4  --   --   --  85.9  PLT 168  --  188  --   --   --  214   < > = values in this interval not displayed.    Scheduled Meds:  acetaminophen  650 mg Per Tube Q6H   amiodarone  150 mg Intravenous Once   aspirin  81 mg Oral Daily   Chlorhexidine Gluconate Cloth  6 each Topical Daily   clonazePAM  1 mg Oral BID   docusate  100 mg Oral BID   feeding supplement (PROSource TF20)  60 mL Per Tube TID   fentaNYL (SUBLIMAZE) injection  50 mcg Intravenous Once   folic acid  1 mg Oral  Daily   insulin aspart  0-20 Units Subcutaneous Q4H   insulin aspart  5 Units Subcutaneous Q4H   methimazole  10 mg Oral Q8H   mexiletine  250 mg Per Tube Q12H   multivitamin  1 tablet Oral QHS   mouth rinse  15 mL Mouth Rinse Q2H   oxyCODONE  5 mg Oral Q6H   pantoprazole (PROTONIX) IV  40 mg Intravenous QHS   polyethylene glycol  17 g Oral Daily   QUEtiapine  50 mg Per Tube BID   sodium chloride flush  3 mL Intravenous Q12H   thiamine  100 mg Oral Daily   Or   thiamine  100 mg Intravenous Daily   Continuous Infusions:  sodium chloride Stopped (07/19/23 1505)   amiodarone 60 mg/hr (07/20/23 0927)   dexmedetomidine (  PRECEDEX) IV infusion 0.4 mcg/kg/hr (07/20/23 0900)   feeding supplement (PIVOT 1.5 CAL) 65 mL/hr at 07/20/23 0900   furosemide     heparin 1,400 Units/hr (07/20/23 0900)   HYDROmorphone 8 mg/hr (07/20/23 0900)   lidocaine 1 mg/min (07/20/23 0900)   meropenem (MERREM) IV Stopped (07/20/23 0540)   midazolam 8 mg/hr (07/20/23 0900)   milrinone 0.25 mcg/kg/min (07/20/23 0900)   norepinephrine (LEVOPHED) Adult infusion 16 mcg/min (07/20/23 0900)   prismasol BGK 4/2.5 1,500 mL/hr at 07/20/23 0739   prismasol BGK 4/2.5 340 mL/hr at 07/20/23 0457   prismasol BGK 4/2.5     propofol (DIPRIVAN) infusion 45 mcg/kg/min (07/20/23 0900)   sodium bicarbonate 25 mEq (Impella PURGE) in dextrose 5 % 1000 mL bag 5.8 mL/hr at 07/13/23 0248   vancomycin 1,500 mg (07/20/23 1012)   vasopressin 0.03 Units/min (07/20/23 0900)   PRN Meds:.sodium chloride, heparin, HYDROmorphone, midazolam, mouth rinse  ABG    Component Value Date/Time   PHART 7.370 07/20/2023 0443   PCO2ART 37.2 07/20/2023 0443   PO2ART 186 (H) 07/20/2023 0443   HCO3 21.5 07/20/2023 0443   TCO2 23 07/20/2023 0443   ACIDBASEDEF 3.0 (H) 07/20/2023 0443   O2SAT 100 07/20/2023 0443   Inbutaed, sedated Cont hum  Coarse bs Sedated Ill appearing  A/P  Dialysis dependent anuric AKI 2/2 ATN from #2 and #3 on CRRT;   Cardiogenic Shock on  Impella; status post transcutaneous mitral valve repair; previously on ECMO VT + PEA SCA  PE on heparin per CCM Anemia, Hb stable, per AHF Hyperkalemia, less present after ECMO decannulation Severe MR s/p TEER 3/5  Move to all 4K, increase preflow settings to limit clotting.   Sabra Heck, MD [facility]

## 2023-07-21 ENCOUNTER — Inpatient Hospital Stay (HOSPITAL_COMMUNITY)

## 2023-07-21 ENCOUNTER — Encounter (HOSPITAL_COMMUNITY): Payer: Self-pay | Admitting: Vascular Surgery

## 2023-07-21 DIAGNOSIS — R570 Cardiogenic shock: Secondary | ICD-10-CM | POA: Diagnosis not present

## 2023-07-21 DIAGNOSIS — E872 Acidosis, unspecified: Secondary | ICD-10-CM | POA: Diagnosis not present

## 2023-07-21 DIAGNOSIS — J9601 Acute respiratory failure with hypoxia: Secondary | ICD-10-CM | POA: Diagnosis not present

## 2023-07-21 DIAGNOSIS — I5032 Chronic diastolic (congestive) heart failure: Secondary | ICD-10-CM | POA: Diagnosis not present

## 2023-07-21 DIAGNOSIS — I469 Cardiac arrest, cause unspecified: Secondary | ICD-10-CM | POA: Diagnosis not present

## 2023-07-21 DIAGNOSIS — N170 Acute kidney failure with tubular necrosis: Secondary | ICD-10-CM | POA: Diagnosis not present

## 2023-07-21 DIAGNOSIS — I2699 Other pulmonary embolism without acute cor pulmonale: Secondary | ICD-10-CM | POA: Diagnosis not present

## 2023-07-21 DIAGNOSIS — G9340 Encephalopathy, unspecified: Secondary | ICD-10-CM | POA: Diagnosis not present

## 2023-07-21 DIAGNOSIS — E875 Hyperkalemia: Secondary | ICD-10-CM | POA: Diagnosis not present

## 2023-07-21 DIAGNOSIS — R0602 Shortness of breath: Secondary | ICD-10-CM | POA: Diagnosis not present

## 2023-07-21 DIAGNOSIS — Z515 Encounter for palliative care: Secondary | ICD-10-CM

## 2023-07-21 DIAGNOSIS — R079 Chest pain, unspecified: Secondary | ICD-10-CM | POA: Diagnosis not present

## 2023-07-21 LAB — GLUCOSE, CAPILLARY
Glucose-Capillary: 128 mg/dL — ABNORMAL HIGH (ref 70–99)
Glucose-Capillary: 131 mg/dL — ABNORMAL HIGH (ref 70–99)
Glucose-Capillary: 135 mg/dL — ABNORMAL HIGH (ref 70–99)
Glucose-Capillary: 129 mg/dL — ABNORMAL HIGH (ref 70–99)
Glucose-Capillary: 127 mg/dL — ABNORMAL HIGH (ref 70–99)

## 2023-07-21 LAB — CBC
HCT: 24 % — ABNORMAL LOW (ref 39.0–52.0)
Hemoglobin: 7.9 g/dL — ABNORMAL LOW (ref 13.0–17.0)
MCH: 28.8 pg (ref 26.0–34.0)
MCHC: 32.9 g/dL (ref 30.0–36.0)
MCV: 87.6 fL (ref 80.0–100.0)
Platelets: 262 10*3/uL (ref 150–400)
RBC: 2.74 MIL/uL — ABNORMAL LOW (ref 4.22–5.81)
RDW: 21.2 % — ABNORMAL HIGH (ref 11.5–15.5)
WBC: 26 10*3/uL — ABNORMAL HIGH (ref 4.0–10.5)
nRBC: 0.1 % (ref 0.0–0.2)

## 2023-07-21 LAB — POCT I-STAT 7, (LYTES, BLD GAS, ICA,H+H)
Acid-base deficit: 2 mmol/L (ref 0.0–2.0)
Acid-base deficit: 2 mmol/L (ref 0.0–2.0)
Bicarbonate: 22.5 mmol/L (ref 20.0–28.0)
Bicarbonate: 22.8 mmol/L (ref 20.0–28.0)
Calcium, Ion: 1.13 mmol/L — ABNORMAL LOW (ref 1.15–1.40)
Calcium, Ion: 1.11 mmol/L — ABNORMAL LOW (ref 1.15–1.40)
HCT: 25 % — ABNORMAL LOW (ref 39.0–52.0)
HCT: 27 % — ABNORMAL LOW (ref 39.0–52.0)
Hemoglobin: 8.5 g/dL — ABNORMAL LOW (ref 13.0–17.0)
Hemoglobin: 9.2 g/dL — ABNORMAL LOW (ref 13.0–17.0)
O2 Saturation: 99 %
O2 Saturation: 99 %
Patient temperature: 37.8
Patient temperature: 36.5
Potassium: 3.9 mmol/L (ref 3.5–5.1)
Potassium: 4.3 mmol/L (ref 3.5–5.1)
Sodium: 133 mmol/L — ABNORMAL LOW (ref 135–145)
Sodium: 133 mmol/L — ABNORMAL LOW (ref 135–145)
TCO2: 24 mmol/L (ref 22–32)
TCO2: 24 mmol/L (ref 22–32)
pCO2 arterial: 38.2 mmHg (ref 32–48)
pCO2 arterial: 39 mmHg (ref 32–48)
pH, Arterial: 7.382 (ref 7.35–7.45)
pH, Arterial: 7.373 (ref 7.35–7.45)
pO2, Arterial: 167 mmHg — ABNORMAL HIGH (ref 83–108)
pO2, Arterial: 165 mmHg — ABNORMAL HIGH (ref 83–108)

## 2023-07-21 LAB — RENAL FUNCTION PANEL
Albumin: 2.3 g/dL — ABNORMAL LOW (ref 3.5–5.0)
Albumin: 2.4 g/dL — ABNORMAL LOW (ref 3.5–5.0)
Anion gap: 13 (ref 5–15)
Anion gap: 11 (ref 5–15)
BUN: 58 mg/dL — ABNORMAL HIGH (ref 6–20)
BUN: 56 mg/dL — ABNORMAL HIGH (ref 6–20)
CO2: 22 mmol/L (ref 22–32)
CO2: 21 mmol/L — ABNORMAL LOW (ref 22–32)
Calcium: 8.3 mg/dL — ABNORMAL LOW (ref 8.9–10.3)
Calcium: 7.9 mg/dL — ABNORMAL LOW (ref 8.9–10.3)
Chloride: 99 mmol/L (ref 98–111)
Chloride: 99 mmol/L (ref 98–111)
Creatinine, Ser: 3.59 mg/dL — ABNORMAL HIGH (ref 0.61–1.24)
Creatinine, Ser: 3.46 mg/dL — ABNORMAL HIGH (ref 0.61–1.24)
GFR, Estimated: 21 mL/min — ABNORMAL LOW (ref >60)
GFR, Estimated: 22 mL/min — ABNORMAL LOW (ref >60)
Glucose, Bld: 121 mg/dL — ABNORMAL HIGH (ref 70–99)
Glucose, Bld: 119 mg/dL — ABNORMAL HIGH (ref 70–99)
Phosphorus: 3 mg/dL (ref 2.5–4.6)
Phosphorus: 2.9 mg/dL (ref 2.5–4.6)
Potassium: 3.8 mmol/L (ref 3.5–5.1)
Potassium: 4.2 mmol/L (ref 3.5–5.1)
Sodium: 134 mmol/L — ABNORMAL LOW (ref 135–145)
Sodium: 131 mmol/L — ABNORMAL LOW (ref 135–145)

## 2023-07-21 LAB — CULTURE, RESPIRATORY W GRAM STAIN

## 2023-07-21 LAB — BASIC METABOLIC PANEL
Anion gap: 11 (ref 5–15)
BUN: 56 mg/dL — ABNORMAL HIGH (ref 6–20)
CO2: 21 mmol/L — ABNORMAL LOW (ref 22–32)
Calcium: 7.9 mg/dL — ABNORMAL LOW (ref 8.9–10.3)
Chloride: 99 mmol/L (ref 98–111)
Creatinine, Ser: 3.4 mg/dL — ABNORMAL HIGH (ref 0.61–1.24)
GFR, Estimated: 23 mL/min — ABNORMAL LOW (ref >60)
Glucose, Bld: 118 mg/dL — ABNORMAL HIGH (ref 70–99)
Potassium: 4.1 mmol/L (ref 3.5–5.1)
Sodium: 131 mmol/L — ABNORMAL LOW (ref 135–145)

## 2023-07-21 LAB — COOXEMETRY PANEL
Carboxyhemoglobin: 1.8 % — ABNORMAL HIGH (ref 0.5–1.5)
Methemoglobin: 1.1 % (ref 0.0–1.5)
O2 Saturation: 82.7 %
Total hemoglobin: 7.6 g/dL — ABNORMAL LOW (ref 12.0–16.0)

## 2023-07-21 LAB — HEPARIN LEVEL (UNFRACTIONATED)
Heparin Unfractionated: <0.1 [IU]/mL — ABNORMAL LOW (ref 0.30–0.70)
Heparin Unfractionated: 0.33 [IU]/mL (ref 0.30–0.70)
Heparin Unfractionated: 0.32 [IU]/mL (ref 0.30–0.70)

## 2023-07-21 LAB — ECHOCARDIOGRAM LIMITED
Height: 76 in
Height: 76 in
S' Lateral: 4.2 cm
S' Lateral: 4.1 cm
Weight: 3185.21 [oz_av]
Weight: 3202.84 [oz_av]

## 2023-07-21 LAB — HEPATIC FUNCTION PANEL
ALT: 15 U/L (ref 0–44)
AST: 36 U/L (ref 15–41)
Albumin: 2.3 g/dL — ABNORMAL LOW (ref 3.5–5.0)
Alkaline Phosphatase: 119 U/L (ref 38–126)
Bilirubin, Direct: 0.5 mg/dL — ABNORMAL HIGH (ref 0.0–0.2)
Indirect Bilirubin: 1 mg/dL — ABNORMAL HIGH (ref 0.3–0.9)
Total Bilirubin: 1.5 mg/dL — ABNORMAL HIGH (ref 0.0–1.2)
Total Protein: 7.1 g/dL (ref 6.5–8.1)

## 2023-07-21 LAB — PREPARE RBC (CROSSMATCH)

## 2023-07-21 LAB — LACTATE DEHYDROGENASE: LDH: 739 U/L — ABNORMAL HIGH (ref 98–192)

## 2023-07-21 LAB — MAGNESIUM: Magnesium: 2.6 mg/dL — ABNORMAL HIGH (ref 1.7–2.4)

## 2023-07-21 LAB — FIBRINOGEN: Fibrinogen: >800 mg/dL — ABNORMAL HIGH (ref 210–475)

## 2023-07-21 LAB — LIDOCAINE LEVEL: Lidocaine Lvl: 3.1 ug/mL (ref 1.5–5.0)

## 2023-07-21 MED ORDER — AMIODARONE LOAD VIA INFUSION
150.0000 mg | Freq: Once | INTRAVENOUS | Status: AC
  Administered 2023-07-21: 150 mg via INTRAVENOUS
  Filled 2023-07-21: qty 83.34

## 2023-07-21 MED ORDER — MILRINONE LACTATE IN DEXTROSE 20-5 MG/100ML-% IV SOLN
0.1250 ug/kg/min | INTRAVENOUS | Status: DC
  Administered 2023-07-21 – 2023-07-23 (×3): 0.125 ug/kg/min via INTRAVENOUS
  Filled 2023-07-21 (×2): qty 100

## 2023-07-21 MED ORDER — SODIUM CHLORIDE 0.9% IV SOLUTION
Freq: Once | INTRAVENOUS | Status: AC
Start: 2023-07-21 — End: 2023-07-21

## 2023-07-21 MED ORDER — SODIUM CHLORIDE 0.9 % IV SOLN
200.0000 mg | INTRAVENOUS | Status: DC
  Administered 2023-07-21 – 2023-07-24 (×3): 200 mg via INTRAVENOUS
  Filled 2023-07-21 (×5): qty 10

## 2023-07-21 MED ORDER — SODIUM CHLORIDE 0.9 % IV SOLN
600.0000 [IU]/h | INTRAVENOUS | Status: DC
  Administered 2023-07-21 – 2023-07-24 (×5): 500 [IU]/h via INTRAVENOUS_CENTRAL
  Administered 2023-07-25: 600 [IU]/h via INTRAVENOUS_CENTRAL
  Administered 2023-07-25: 500 [IU]/h via INTRAVENOUS_CENTRAL
  Filled 2023-07-21: qty 2
  Filled 2023-07-21 (×3): qty 10000
  Filled 2023-07-21 (×2): qty 2
  Filled 2023-07-21: qty 10000

## 2023-07-21 NOTE — Plan of Care (Signed)
     Referral previously received for [name] for goals of care discussion. Chart reviewed, see last PMT note dated 07/18/2023.  Chart reviewed and Palliative consult was discontinued 07/18/22 around 2300 so we will sign off for now. Please contact us and enter a new consult order for any new palliative care needs.  Thank you for your referral and allowing PMT to assist in Aaron [name]'s care.   Wynne Dust, NP Palliative Medicine Team Phone: [phone]  NO CHARGE

## 2023-07-21 NOTE — Progress Notes (Signed)
 Orthopedic Tech Progress Note Patient Details:  Aaron [name] 02/05/1986 [id number]  Ortho Devices Type of Ortho Device: Ace wrap, Unna boot Ortho Device/Splint Location: BLE Ortho Device/Splint Interventions: Ordered, Application, Adjustment   Post Interventions Patient Tolerated: Well Instructions Provided: Care of device  Donald Pore 07/21/2023, 2:17 PM

## 2023-07-21 NOTE — Progress Notes (Signed)
 PHARMACY - ANTICOAGULATION CONSULT NOTE  Pharmacy Consult for Heparin  Indication: pulmonary embolus, Impella, s/p ECMO decannulation   No Known Allergies  Patient Measurements: Height: 6\' 4"  (193 cm) Weight: 90.3 kg (199 lb 1.2 oz) IBW/kg (Calculated) : 86.8 Heparin Dosing Weight: n/a  Vital Signs: Temp: 100 F (37.8 C) (03/10 0500) Temp Source: Core (03/10 0400) Pulse Rate: 72 (03/10 0000)  Labs: Recent Labs    07/18/23 1531 07/18/23 1532 07/19/23 0035 07/19/23 0347 07/19/23 0358 07/19/23 0402 07/20/23 0501 07/20/23 1432 07/20/23 1624 07/20/23 1649 07/21/23 0416 07/21/23 0418  HGB 9.0*   < >  --  8.3*  --    < > 7.6*  --   --  7.5* 8.5* 7.9*  HCT 27.2*   < >  --  25.1*  --    < > 22.6*  --   --  22.0* 25.0* 24.0*  PLT 168  --   --  188  --   --  214  --   --   --   --  262  APTT 67*  --  49*  --   --   --   --  73*  --   --   --   --   LABPROT  --   --   --   --  15.3*  --   --   --   --   --   --   --   INR  --   --   --   --  1.2  --   --   --   --   --   --   --   HEPARINUNFRC  --   --  <0.10*  --   --    < > <0.10* <0.10*  --   --   --  <0.10*  CREATININE 3.85*  --   --   --  5.67*   < > 4.88*  --  4.05*  --   --  3.59*   < > = values in this interval not displayed.    Estimated Creatinine Clearance: 34.3 mL/min (A) (by C-G formula based on SCr of 3.59 mg/dL (H)).   Medical History: Past Medical History:  Diagnosis Date   Atrial fibrillation [facility])    Hyperthyroidism    Mitral regurgitation    NSTEMI (non-ST elevated myocardial infarction) [facility])     Assessment: 38 yo M with bilateral PE s/p TNK (2/27 @1156 ) now on [place] ECMO + Impella. Pharmacy consulted for bivalirudin for anticoagulation.   S/p impella CP >5.5 3/3 - bivalirudin was previously held with oozing at insertion site but restart 3/4. Patient underwent mitraclip 3/5.  aPTT 67 is therapeutic on bivalirudin@0 .02 mg/kg/hr. Remains on CRRT. Patient is getting decannulated 3/7.   3/10 AM update:   Pt is s/p ECMO decannulation 3/7 PM Heparin level remains undetectable  Impella remains in place Hgb stable in the 7's for several days  Goal of Therapy:  Heparin level 0.3-0.5 units/mL Monitor platelets by anticoagulation protocol: Yes   Plan:  Inc heparin to 1850 units/hr Heparin level in 6 hours  Abran Duke, PharmD, BCPS Clinical Pharmacist Phone: [phone]

## 2023-07-21 NOTE — Progress Notes (Signed)
 [facility] NEPHROLOGY PROGRESS NOTE  Assessment/ Plan: Pt is a 38 y.o. yo male  likely ATN after SCA, cardiogenic shock on ECMO and Impella, AHRF/VDRF   # Dialysis dependent anuric AKI 2/2 ATN from cardiogenic shock on CRRT since 07/11/23, Left subclavian temp HD cath placed by CCM; All 4 K, UF goal 100 cc./ hr.   # Cardiogenic Shock on  Impella; status post transcutaneous mitral valve repair; previously on ECMO.  # VT + PEA SCA, as per cardiology team. # PE on heparin per CCM # Anemia, Hb stable, per AHF # Hyperkalemia, less present after ECMO decannulation # Severe MR s/p TEER 3/5  Subjective: Seen and examined and ICU.  CRRT running well, UF around 100 cc an hour.  Currently on milrinone, Levophed, vasopressin and sedatives.  Discussed with ICU nurse. Objective Vital signs in last 24 hours: Vitals:   07/21/23 0830 07/21/23 0845 07/21/23 0900 07/21/23 0902  BP:      Pulse:  80    Resp: (!) 30 (!) 30 (!) 30 (!) 30  Temp: 99.5 F (37.5 C) 99.1 F (37.3 C) 98.8 F (37.1 C) 98.8 F (37.1 C)  TempSrc:  Core    SpO2:      Weight:      Height:       Weight change: 0.5 kg  Intake/Output Summary (Last 24 hours) at 07/21/2023 0914 Last data filed at 07/21/2023 0900 Gross per 24 hour  Intake 5618.62 ml  Output 6965 ml  Net -1346.38 ml       Labs: RENAL PANEL Recent Labs  Lab 07/17/23 0115 07/17/23 0247 07/18/23 0351 07/18/23 0356 07/19/23 0358 07/19/23 0402 07/19/23 1608 07/19/23 1620 07/20/23 0501 07/20/23 1624 07/20/23 1649 07/21/23 0416 07/21/23 0418  NA 135  137   < > 138  137   < > 136   < > 134*   < > 134* 133* 134* 133* 134*  K 4.6  4.6   < > 4.2  4.1   < > 4.6   < > 3.6   < > 3.6 3.7 4.1 3.9 3.8  CL 102  101   < > 102  101   < > 101  --  98  --  100 101  --   --  99  CO2 20*  23   < > 24  24   < > 23  --  20*  --  21* 21*  --   --  22  GLUCOSE 135*  131*   < > 145*  142*   < > 146*  --  140*  --  135* 131*  --   --  121*  BUN  67*  64*   < > 68*  68*   < > 93*  --  99*  --  81* 64*  --   --  58*  CREATININE 3.95*  3.81*   < > 3.59*  3.64*   < > 5.67*  --  5.65*  --  4.88* 4.05*  --   --  3.59*  CALCIUM 8.0*  8.0*   < > 8.3*  8.3*   < > 8.1*  --  8.1*  --  7.9* 7.9*  --   --  8.3*  MG 2.4  --  2.8*  --  2.6*  --   --   --  2.3  --   --   --  2.6*  PHOS 4.3   < > 2.4*   < >  3.4  --  2.0*  --  3.6 2.8  --   --  3.0  ALBUMIN 2.6*  2.6*   < > 2.9*  2.9*   < > 2.8*  2.8*  --  2.4*  --  2.3* 2.2*  --   --  2.3*  2.3*   < > = values in this interval not displayed.    Liver Function Tests: Recent Labs  Lab 07/19/23 0358 07/19/23 1608 07/20/23 0501 07/20/23 1624 07/21/23 0418  AST 49*  --  44*  --  36  ALT 20  --  16  --  15  ALKPHOS 97  --  102  --  119  BILITOT 1.8*  --  2.0*  --  1.5*  PROT 6.7  --  6.3*  --  7.1  ALBUMIN 2.8*  2.8*   < > 2.3* 2.2* 2.3*  2.3*   < > = values in this interval not displayed.   No results for input(s): "LIPASE", "AMYLASE" in the last 168 hours. No results for input(s): "AMMONIA" in the last 168 hours. CBC: Recent Labs    07/20/23 0443 07/20/23 0501 07/20/23 1649 07/21/23 0416 07/21/23 0418  HGB 7.5* 7.6* 7.5* 8.5* 7.9*  MCV  --  85.9  --   --  87.6    Cardiac Enzymes: No results for input(s): "CKTOTAL", "CKMB", "CKMBINDEX", "TROPONINI" in the last 168 hours. CBG: Recent Labs  Lab 07/20/23 1623 07/20/23 1953 07/20/23 2321 07/21/23 0312 07/21/23 0834  GLUCAP 118* 139* 131* 128* 131*    Iron Studies: No results for input(s): "IRON", "TIBC", "TRANSFERRIN", "FERRITIN" in the last 72 hours. Studies/Results: DG Chest Port 1 View Result Date: 07/20/2023 CLINICAL DATA:  [address] ARDS (adult respiratory distress syndrome) [facility]) [phone] EXAM: PORTABLE CHEST 1 VIEW COMPARISON:  July 19, 2023, March seventh 2025 FINDINGS: The cardiomediastinal silhouette is unchanged in contour.Impella catheter. ETT tip terminates 6.4 cm above the carina. RIGHT IJ PA catheter tip  terminates over the RIGHT main pulmonary artery proximally. LEFT IJ CVC tip terminates over the superior cavoatrial junction. LEFT subclavian CVC tip terminates over the LEFT brachiocephalic vein. The enteric tube courses through the chest to the abdomen beyond the field-of-view. Trace LEFT pleural effusion. No pneumothorax. Interval marked improvement in aeration in the LEFT lung with mild residual LEFT retrocardiac opacity likely reflecting residual atelectasis. Some patchy reticulonodular opacities persist in the bilateral perihilar areas, nonspecific. Enteric contrast delineates the stomach. IMPRESSION: 1. Support apparatus as described above. 2. Interval marked improvement in aeration in the LEFT lung with mild residual LEFT retrocardiac opacity likely reflecting residual atelectasis. 3. Some patchy reticulonodular opacities persist in the bilateral perihilar areas, nonspecific. Electronically Signed   By: Meda Klinefelter M.D.   On: 07/20/2023 10:16   DG CHEST PORT 1 VIEW Result Date: 07/19/2023 CLINICAL DATA:  Line placement EXAM: PORTABLE CHEST 1 VIEW COMPARISON:  X-ray earlier 07/19/2023 at 9:38 a.m. Older exams as well FINDINGS: Stable ET tube, enteric tube, left IJ line. The Swan-Ganz catheter via the right IJ is more in the right pulmonary artery. There is a ECMO catheter in place along the cardiac silhouette. Interval placement of a left subclavian line with tip overlying the upper mediastinum. This is not cross to the right side. Exact tip location is uncertain. There is increasing opacification along left hemithorax with increasing fluid. The heart is enlarged. Vascular congestion of the right lung. The right lateral thorax sick edge is clipped off the edge of the film. No  right-sided pneumothorax or large effusion. Critical Value/emergent results were called by telephone at the time of interpretation on 07/19/2023 at 11:21 am to nurse Toni Amend, who verbally acknowledged these results. IMPRESSION:  New left subclavian line with tip overlying the upper mediastinum. The exact tip location is uncertain and there is increasing left pleural effusion and lung opacity. There is also some shift of the mediastinum from right to left. Component of volume loss is possible. Please correlate with blood return and additional workup for catheter location. Advancement of the Swan-Ganz catheter now with tip along the right pulmonary artery. Electronically Signed   By: Karen Kays M.D.   On: 07/19/2023 11:46   DG CHEST PORT 1 VIEW Result Date: 07/19/2023 CLINICAL DATA:  Shortness of breath. EXAM: PORTABLE CHEST 1 VIEW COMPARISON:  X-ray 07/19/2023 at 9:36 a.m. FINDINGS: Interval retraction of the Swan-Ganz catheter via the right IJ now with tip along the main pulmonary artery. Stable ET tube, enteric tube, left IJ catheter and ECMO catheter. Enlarged heart with vascular congestion and some edema. Persistent left retrocardiac opacity and effusion. No pneumothorax. Overlapping cardiac leads. IMPRESSION: Interval retraction of the Swan-Ganz catheter with tip overlying the main pulmonary artery. Electronically Signed   By: Karen Kays M.D.   On: 07/19/2023 11:24   DG CHEST PORT 1 VIEW Result Date: 07/19/2023 CLINICAL DATA:  Shortness of breath EXAM: PORTABLE CHEST 1 VIEW COMPARISON:  X-ray 07/18/2023 and older FINDINGS: Stable ET tube, enteric tube. Right IJ Swan-Ganz catheter with tip in the right lung hilum, somewhat more peripheral than usually seen and advanced from previous. Left IJ line with tip along the central SVC. Separate right subclavian presumed ECMO catheter. Enlarged heart with vascular congestion and trace edema. Confluent left lung base opacity with left effusion. No pneumothorax. Overlapping cardiac leads. Density in the upper abdomen on the left. This could be contrast in the stomach or other process. IMPRESSION: Mass with the Swan-Ganz catheter further into the right lung hilum of the upper lobe. Numerous  other tubes and lines again seen. Enlarged heart with vascular congestion and some developing edema. Persistent left lung base opacity with increasing left pleural effusion. Electronically Signed   By: Karen Kays M.D.   On: 07/19/2023 11:23    Medications: Infusions:  amiodarone 30 mg/hr (07/21/23 0900)   dexmedetomidine (PRECEDEX) IV infusion 0.4 mcg/kg/hr (07/21/23 0900)   feeding supplement (PIVOT 1.5 CAL) 65 mL/hr at 07/21/23 0900   heparin 10,000 units/ 20 mL infusion syringe 500 Units/hr (07/21/23 0900)   heparin 1,850 Units/hr (07/21/23 0900)   HYDROmorphone 8 mg/hr (07/21/23 0900)   meropenem (MERREM) IV Stopped (07/21/23 4540)   micafungin (MYCAMINE) 200 mg in sodium chloride 0.9 % 100 mL IVPB     midazolam Stopped (07/20/23 1813)   milrinone 0.25 mcg/kg/min (07/21/23 0900)   norepinephrine (LEVOPHED) Adult infusion Stopped (07/21/23 0736)   prismasol BGK 4/2.5 1,500 mL/hr at 07/21/23 0358   prismasol BGK 4/2.5 500 mL/hr at 07/21/23 0308   prismasol BGK 4/2.5 400 mL/hr at 07/20/23 2228   propofol (DIPRIVAN) infusion 25 mcg/kg/min (07/21/23 0900)   sodium bicarbonate 25 mEq (Impella PURGE) in dextrose 5 % 1000 mL bag 5.8 mL/hr at 07/13/23 0248   vancomycin Stopped (07/20/23 1212)   vasopressin 0.04 Units/min (07/21/23 0900)    Scheduled Medications:  acetaminophen  650 mg Per Tube Q6H   amiodarone  150 mg Intravenous Once   aspirin  81 mg Oral Daily   Chlorhexidine Gluconate Cloth  6 each Topical  Daily   clonazePAM  1 mg Oral BID   docusate  100 mg Oral BID   feeding supplement (PROSource TF20)  60 mL Per Tube TID   fentaNYL (SUBLIMAZE) injection  50 mcg Intravenous Once   folic acid  1 mg Oral Daily   insulin aspart  0-20 Units Subcutaneous Q4H   insulin aspart  5 Units Subcutaneous Q4H   methimazole  10 mg Oral Q8H   mexiletine  250 mg Per Tube Q12H   multivitamin  1 tablet Oral QHS   mouth rinse  15 mL Mouth Rinse Q2H   oxyCODONE  5 mg Oral Q6H   pantoprazole  (PROTONIX) IV  40 mg Intravenous QHS   polyethylene glycol  17 g Oral Daily   sodium chloride flush  3 mL Intravenous Q12H   thiamine  100 mg Oral Daily   Or   thiamine  100 mg Intravenous Daily    have reviewed scheduled and prn medications.  Physical Exam: General: Critically ill looking male, on CRRT. Heart:RRR, s1s2 nl Lungs: Intubated, sedated. Abdomen:soft, non-distended Extremities:No edema Dialysis Access: Left subclavian temporary HD line.  Aaron [name] Aaron [name] Aaron [name] 07/21/2023,9:14 AM  LOS: 12 days

## 2023-07-21 NOTE — Progress Notes (Signed)
 Patient ID: Aaron [name], male   DOB: 26-Jul-1985, 38 y.o.   MRN: [id number]     Advanced Heart Failure Rounding Note  Cardiologist: Christell Constant, MD  Chief Complaint: V-A ECMO  Subjective:    Admitted 2/26 with worsening shortness of breath, chest pain, atrial fibrillation with RVR. Decompensated 2/27 with IVF, nodal blockade with subsequent respiratory arrest, ROSC achieved after ~16 minute down time Femoral VA ecmo cannulation 2/27 with Impella CP vent  Patient is on milrinone 0.25, vasopressin 0.04, NO 20 ppm.  NE off.  CVVH ongoing, pulling net UF 100 cc/hr.   Amiodarone gtt 60 mg/hr + mexiletine, no VT overnight and remains in NSR.   Tm 101.1 today, WBCs 26.  He is on vancomycin/meropenem.   Swan: RA 9 PA 52/21 CI 4.51  Impella 5.5 P4 Flow 2.0 L/min Heparin gtt   Objective:   Weight Range: 90.8 kg Body mass index is 24.37 kg/m.   Vital Signs:   Temp:  [95 F (35 C)-101.1 F (38.4 C)] 101.1 F (38.4 C) (03/10 0700) Pulse Rate:  [58-91] 91 (03/10 0700) Resp:  [22-30] 30 (03/10 0700) SpO2:  [100 %] 100 % (03/10 0700) Arterial Line BP: (63-182)/(35-77) 141/64 (03/10 0700) FiO2 (%):  [40 %-50 %] 40 % (03/10 0413) Weight:  [90.8 kg] 90.8 kg (03/10 0500) Last BM Date : 07/20/23  Weight change: Filed Weights   07/19/23 0600 07/20/23 0500 07/21/23 0500  Weight: 88.8 kg 90.3 kg 90.8 kg    Intake/Output:   Intake/Output Summary (Last 24 hours) at 07/21/2023 0744 Last data filed at 07/21/2023 0600 Gross per 24 hour  Intake 5433.53 ml  Output 7101 ml  Net -1667.47 ml      Physical Exam    General: Intubated Neck: JVP 8-9 cm, no thyromegaly or thyroid nodule.  Lungs: Decreased at bases.  CV: Nondisplaced PMI.  Heart regular S1/S2, no S3/S4, no murmur.  No peripheral edema.   Abdomen: Soft, nontender, no hepatosplenomegaly, no distention.  Skin: Intact without lesions or rashes.  Neurologic: Sedated on vent.  Extremities: No clubbing or cyanosis.   HEENT: Normal.   Telemetry   NSR 80s (personally reviewed)  Labs    CBC Recent Labs    07/20/23 0501 07/20/23 1649 07/21/23 0416 07/21/23 0418  WBC 21.8*  --   --  26.0*  HGB 7.6*   < > 8.5* 7.9*  HCT 22.6*   < > 25.0* 24.0*  MCV 85.9  --   --  87.6  PLT 214  --   --  262   < > = values in this interval not displayed.   Basic Metabolic Panel Recent Labs    46/96/29 0501 07/20/23 1624 07/20/23 1649 07/21/23 0416 07/21/23 0418  NA 134* 133*   < > 133* 134*  K 3.6 3.7   < > 3.9 3.8  CL 100 101  --   --  99  CO2 21* 21*  --   --  22  GLUCOSE 135* 131*  --   --  121*  BUN 81* 64*  --   --  58*  CREATININE 4.88* 4.05*  --   --  3.59*  CALCIUM 7.9* 7.9*  --   --  8.3*  MG 2.3  --   --   --  2.6*  PHOS 3.6 2.8  --   --  3.0   < > = values in this interval not displayed.   Liver Function Tests Recent Labs  07/20/23 0501 07/20/23 1624 07/21/23 0418  AST 44*  --  36  ALT 16  --  15  ALKPHOS 102  --  119  BILITOT 2.0*  --  1.5*  PROT 6.3*  --  7.1  ALBUMIN 2.3* 2.2* 2.3*  2.3*    BNP: BNP (last 3 results) Recent Labs    07/09/23 1934 07/10/23 1224  BNP 703.7* 980.5*   Thyroid Function Tests No results for input(s): "TSH", "T4TOTAL", "T3FREE", "THYROIDAB" in the last 72 hours.  Invalid input(s): "FREET3"    Medications:     Scheduled Medications:  sodium chloride   Intravenous Once   acetaminophen  650 mg Per Tube Q6H   amiodarone  150 mg Intravenous Once   aspirin  81 mg Oral Daily   Chlorhexidine Gluconate Cloth  6 each Topical Daily   clonazePAM  1 mg Oral BID   docusate  100 mg Oral BID   feeding supplement (PROSource TF20)  60 mL Per Tube TID   fentaNYL (SUBLIMAZE) injection  50 mcg Intravenous Once   folic acid  1 mg Oral Daily   insulin aspart  0-20 Units Subcutaneous Q4H   insulin aspart  5 Units Subcutaneous Q4H   methimazole  10 mg Oral Q8H   mexiletine  250 mg Per Tube Q12H   multivitamin  1 tablet Oral QHS   mouth rinse  15  mL Mouth Rinse Q2H   oxyCODONE  5 mg Oral Q6H   pantoprazole (PROTONIX) IV  40 mg Intravenous QHS   polyethylene glycol  17 g Oral Daily   sodium chloride flush  3 mL Intravenous Q12H   thiamine  100 mg Oral Daily   Or   thiamine  100 mg Intravenous Daily    Infusions:  amiodarone 60 mg/hr (07/21/23 0600)   dexmedetomidine (PRECEDEX) IV infusion 0.4 mcg/kg/hr (07/21/23 0600)   feeding supplement (PIVOT 1.5 CAL) 65 mL/hr at 07/21/23 0600   heparin 10,000 units/ 20 mL infusion syringe 500 Units/hr (07/21/23 0655)   heparin 1,850 Units/hr (07/21/23 0600)   HYDROmorphone 8 mg/hr (07/21/23 0600)   meropenem (MERREM) IV 200 mL/hr at 07/21/23 0600   micafungin (MYCAMINE) 200 mg in sodium chloride 0.9 % 100 mL IVPB     midazolam Stopped (07/20/23 1813)   milrinone 0.25 mcg/kg/min (07/21/23 0600)   norepinephrine (LEVOPHED) Adult infusion 8 mcg/min (07/21/23 0600)   prismasol BGK 4/2.5 1,500 mL/hr at 07/21/23 0358   prismasol BGK 4/2.5 500 mL/hr at 07/21/23 0308   prismasol BGK 4/2.5 400 mL/hr at 07/20/23 2228   propofol (DIPRIVAN) infusion 35 mcg/kg/min (07/21/23 0600)   sodium bicarbonate 25 mEq (Impella PURGE) in dextrose 5 % 1000 mL bag 5.8 mL/hr at 07/13/23 0248   vancomycin Stopped (07/20/23 1212)   vasopressin 0.04 Units/min (07/21/23 0600)    PRN Medications: heparin, HYDROmorphone, midazolam, mouth rinse    Patient Profile   Patient with a history of Grave's disease, severe primary mitral regurgitation who presented with chest pain and segmental PE. Subsequent progressed to SCAI Stage E shock requiring VA ECMO cannulation on 2/27.   Assessment/Plan   SCAI Stage E Cardiogenic shock - Suspect hypoxic respiratory failure driven with segmental PE on top of severe MR, nodal blockage, and thyrotoxicosis - Down time ~ 20 minutes, good mental status confirmed post code - We performed a mini-turn down on 07/15/23 at bedside; ECMO flows were reduced in a stepwise pattern by 0.5LPM to  a goal of 2.5LPM. During this time, impella 5.5  flows increased to maintain net even flows. With reduction of ECMO flows, patient had onset of frequent PVCs/NSVT requiring amio boluses. In addition, despite increased LV unloading patient continued to have severe 4+ MR with progressive RV failure on bedside TTE.  - Underwent successful mTEER on 07/16/23 with placement of x3 clips and improvement in MR from severe to mild. Mean mitral gradient of 3-48mmHg.  - Decannulated from V-A ECMO by Dr. Karin Lieu on 07/19/23 with vascular cut down and primary repair of left femoral artery.  - CVVH ongoing, net negative 100 cc/hr.  CVP 9 today. Will continue current CVVH rate.  - He is on milrinone 0.25 + vasopressin 0.04.  MAP stable.  Wean vasopressin as able.  CI good at 4.51.  - PA 52/21 on NO 20 ppm and milrinone.  Discussed with Dr. Katrinka Blazing, will try to wean down NO today.  - Decrease Impella 5.5 to P3 this morning.  Will aim to remove Impella 5.5 tomorrow when Dr. Dorris Fetch is back.  - Echo today to reassess LV/RV and Impella position.   Severe mitral valve regurgitation - Noted on previous echocardiogram - Now s/p mTEER on 07/16/23; improvement in MR from severe to mild.  - No murmur on exam  Pulmonary hypertension - PA pressures reached 80s/30s with PCWP of ~10, TPG of ~84mmHg suggestive of PVR >5 on 07/17/22 - PA 52/21; wean NO today (currently 20 ppm). Overall observational data is not supportive with the use of sildenafil in fixed PH secondary to MS/MR.   VT/NSVT - Frequent runs of NSVT likely secondary to impella 5.5 position - Lidocaine gtt& amiodarone, now on mexiletine + amiodarone 60mg /hr - No VT overnight, decrease amiodarone to 30 mg/hr.   ID - Still on vancomycin/meropenem; possibly SIRS after decannulation.  - Bronchoscopy with thick purulent secretions - Started on micafungin on 07/19/23 - WBCs 22.8 => 26 and remains febrile to 101.1.   Anemia - Hgb 7.9 today, will give 1 unit PRBCs.    Acute renal failure/hyperkalemia - CVVH ongoing, continue net negative UF 100 cc/hr.   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   Atrial fibrillation - Present on arrival, in the setting of PE and thyroid disease - sinus rhythm today.  - decrease amio to 30mg /hr  Pulmonary embolism:  - Segmental without right heart strain - On heparin gtt.   CAD - Prior embolic infarct in the LAD territory with corresponding LGE on CMR - Lifelong anticoagulation - On heparin gtt here.   Sedation - Propofol/versed - weaning versed today.   CRITICAL CARE Performed by: Marca Ancona  Total critical care time: 50 minutes  Critical care time was exclusive of separately billable procedures and treating other patients.  Critical care was necessary to treat or prevent imminent or life-threatening deterioration.  Critical care was time spent personally by me on the following activities: development of treatment plan with patient and/or surrogate as well as nursing, discussions with consultants, evaluation of patient's response to treatment, examination of patient, obtaining history from patient or surrogate, ordering and performing treatments and interventions, ordering and review of laboratory studies, ordering and review of radiographic studies, pulse oximetry and re-evaluation of patient's condition.   Length of Stay: 43  Marca Ancona, MD  07/21/2023, 7:44 AM  Advanced Heart Failure Team Pager [phone] (M-F; 7a - 5p)  Please contact [facility] for night-coverage after  hours (5p -7a ) and weekends on [email or web address]

## 2023-07-21 NOTE — Progress Notes (Signed)
 Nitric decreased to 10ppm per order.

## 2023-07-21 NOTE — Progress Notes (Signed)
 NAME:  Aaron [name], MRN:  [id number], DOB:  1986/01/04, LOS: 12 ADMISSION DATE:  07/09/2023, CONSULTATION DATE:  07/11/2023 REFERRING MD: Clearnce Hasten, CHIEF COMPLAINT: Status post cardiac arrest  History of Present Illness:  38 year old male with hypothyroidism, paroxysmal A-fib, severe mitral regurgitation who presented to [facility] long hospital with chest pain and shortness of breath for few days associated cough, nausea and diarrhea.  In the emergency department he was diagnosed with PE, was started on IV heparin.  He takes beta-blocker, steroid and methimazole for hyperthyroidism.  On 2/27 patient rapidly deteriorated developed V. tach/V-fib cardiac arrest patient was intubated after 40 minutes of CPR, cardiology was consulted patient was placed on VA ECMO and was transferred to [facility]  Pertinent  Medical History   Past Medical History:  Diagnosis Date   Atrial fibrillation [facility])    Hyperthyroidism    Mitral regurgitation    NSTEMI (non-ST elevated myocardial infarction) (HCC)     Significant Hospital Events: Including procedures, antibiotic start and stop dates in addition to other pertinent events   3/5 salvage TEER (transcatheter edge to edge mitral valve repair 3/7 weaned off VA  Interim History / Subjective:  No events iNO weaned to 20 Impella p3  Objective   Blood pressure (!) 141/71, pulse 80, temperature 99.1 F (37.3 C), temperature source Core, resp. rate (!) 30, height 6\' 4"  (1.93 m), weight 90.8 kg, SpO2 100%. PAP: (35-62)/(11-24) 52/21 CVP:  [3 mmHg-14 mmHg] 8 mmHg CO:  [8.5 L/min-10.3 L/min] 10.3 L/min CI:  [3.71 L/min/m2-4.51 L/min/m2] 4.51 L/min/m2  Vent Mode: PRVC FiO2 (%):  [40 %-50 %] 40 % Set Rate:  [30 bmp] 30 bmp Vt Set:  [590 mL-610 mL] 610 mL PEEP:  [10 cmH20] 10 cmH20 Plateau Pressure:  [25 cmH20-27 cmH20] 27 cmH20   Intake/Output Summary (Last 24 hours) at 07/21/2023 0900 Last data filed at 07/21/2023 0820 Gross per 24 hour   Intake 5469.62 ml  Output 6701 ml  Net -1231.38 ml   Filed Weights   07/19/23 0600 07/20/23 0500 07/21/23 0500  Weight: 88.8 kg 90.3 kg 90.8 kg    Examination: No distress Anasarca improved Heavily sedated Impella looks okay Lungs improved rhonic Previous ECMO cannulation sites look okay Abd soft  Labs improved except wbc back up and spiked fever again; speciation on BAL pending  Resolved Hospital Problem list   Lactic acidosis, resolved Refractory hyperkalemia, improving  Assessment & Plan:  Baseline severe mitral insufficiency with superimposed RV>LV shock state culminating in prolonged IHCA s/p VA ECMO 2/27- salvage TEER 3/5, weaned from [place] to impella 3/7. Now looking closer to be able to wean impella. Post arrest encephalopathy- following commands a few days ago per nursing, neg head imaging; will need to be a rapid wean once ready to extubate Acute renal failure- now requiring CRRT Coagulopathy and hemolysis- improving Thyrotoxicosis- on methimazole, stress steroids; stress steroids off on 3/8 Shock liver- improving Ongoing severe multifactorial pulmonary HTN- remains on iNO 3/6 worsening fevers, rising WBC- vanc added 3/6; mica x 1 3/8;  Left lung mucus plugging- improved after bronch 3/8; apparently per wife he always coughs up brown stuff, also smokes brown blunts of weed HTN/Tachycardia- worsened by fevers, ablated  Bival per PharmD Sedation for vent synchrony; pressors seem to be a byproduct of propofol F/u tracheal aspirate, given recurrence of fever and WBC elevation after antifungal discontinuation, will restart for a few days Today, we will try to wean off iNO; keep on P3, repeat echo  this afternoon and tentative impella DC tomorrow After impella is out, work toward extubation CRRT with pull depending on exam and hemodynamics Regarding the hyperthyroidism, we will recheck labs around Wednesday and adjust methimazole dosing accordingly if needed  Best  Practice (right click and "Reselect all SmartList Selections" daily)   Diet/type: NPO TF DVT prophylaxis systemic bival Pressure ulcer(s): N/A GI prophylaxis: PPI Lines: Central line, Arterial Line, and yes and it is still needed.   Foley:  Yes, and it is still needed Code Status:  full code Last date of multidisciplinary goals of care discussion [Per primary team]  31 min cc time Myrla Halsted MD PCCM

## 2023-07-21 NOTE — Progress Notes (Deleted)
     Referral previously received for [name] for goals of care discussion. Chart reviewed, see last PMT note dated 07/18/2023.  Chart reviewed and Palliative consult was discontinued 07/18/22 around 2300 so we will sign off for now. Please contact us and enter a new consult order for any new palliative care needs.  Thank you for your referral and allowing PMT to assist in Aaron [name]'s care.   Wynne Dust, NP Palliative Medicine Team Phone: [phone]  NO CHARGE

## 2023-07-21 NOTE — Progress Notes (Signed)
 Nitric turned down to 4ppm per order.

## 2023-07-21 NOTE — Progress Notes (Signed)
 Nitric turned down to 5ppm

## 2023-07-21 NOTE — Progress Notes (Signed)
 PHARMACY - ANTICOAGULATION CONSULT NOTE  Pharmacy Consult for Heparin  Indication: pulmonary embolus, Impella, s/p ECMO decannulation   No Known Allergies  Patient Measurements: Height: 6\' 4"  (193 cm) Weight: 90.8 kg (200 lb 2.8 oz) IBW/kg (Calculated) : 86.8 Heparin Dosing Weight: n/a  Vital Signs: Temp: 99.3 F (37.4 C) (03/10 1520) Temp Source: Core (03/10 0845) Pulse Rate: 84 (03/10 1520)  Labs: Recent Labs    07/18/23 1531 07/18/23 1532 07/19/23 0035 07/19/23 0347 07/19/23 0358 07/19/23 0402 07/20/23 0501 07/20/23 1432 07/20/23 1624 07/20/23 1649 07/21/23 0416 07/21/23 0418 07/21/23 1328  HGB 9.0*   < >  --  8.3*  --    < > 7.6*  --   --  7.5* 8.5* 7.9*  --   HCT 27.2*   < >  --  25.1*  --    < > 22.6*  --   --  22.0* 25.0* 24.0*  --   PLT 168  --   --  188  --   --  214  --   --   --   --  262  --   APTT 67*  --  49*  --   --   --   --  73*  --   --   --   --   --   LABPROT  --   --   --   --  15.3*  --   --   --   --   --   --   --   --   INR  --   --   --   --  1.2  --   --   --   --   --   --   --   --   HEPARINUNFRC  --   --  <0.10*  --   --    < > <0.10* <0.10*  --   --   --  <0.10* 0.33  CREATININE 3.85*  --   --   --  5.67*   < > 4.88*  --  4.05*  --   --  3.59*  --    < > = values in this interval not displayed.    Estimated Creatinine Clearance: 34.3 mL/min (A) (by C-G formula based on SCr of 3.59 mg/dL (H)).   Medical History: Past Medical History:  Diagnosis Date   Atrial fibrillation [facility])    Hyperthyroidism    Mitral regurgitation    NSTEMI (non-ST elevated myocardial infarction) [facility])     Assessment: 38 yo M with bilateral PE s/p TNK (2/27 @1156 ) now on [place] ECMO + Impella. Pharmacy consulted for bivalirudin for anticoagulation.   S/p impella CP >5.5 3/3 - bivalirudin was previously held with oozing at insertion site but restart 3/4. Patient underwent mitraclip 3/5.  Heparin level is therapeutic at 0.33, on heparin infusion at 1850  units/hr - of note, also receiving heparin in CRRT circuit at 500 units/hr given has been clotting off filter. Hgb 7.9, plt 262. Fibrinogen >800, LDH 739. No s/sx of bleeding. Remains on P3 with Impella 5.5   Goal of Therapy:  Heparin level 0.3-0.5 units/mL Monitor platelets by anticoagulation protocol: Yes   Plan:  Continue heparin at 1850 units/hr Heparin level in 6 hours Monitor daily heparin level, CBC, and for s/sx of bleeding  Thank you for allowing pharmacy to participate in this patient's care,  Sherron Monday, PharmD, BCCCP Clinical Pharmacist  Phone: [phone] 07/21/2023 3:56 PM  Please check AMION for all [facility] Pharmacy phone numbers After 10:00 PM, call Main Pharmacy [phone]

## 2023-07-22 ENCOUNTER — Inpatient Hospital Stay (HOSPITAL_COMMUNITY)

## 2023-07-22 DIAGNOSIS — R918 Other nonspecific abnormal finding of lung field: Secondary | ICD-10-CM | POA: Diagnosis not present

## 2023-07-22 DIAGNOSIS — Z006 Encounter for examination for normal comparison and control in clinical research program: Secondary | ICD-10-CM | POA: Diagnosis not present

## 2023-07-22 DIAGNOSIS — E43 Unspecified severe protein-calorie malnutrition: Secondary | ICD-10-CM | POA: Diagnosis not present

## 2023-07-22 DIAGNOSIS — I4901 Ventricular fibrillation: Secondary | ICD-10-CM | POA: Diagnosis not present

## 2023-07-22 DIAGNOSIS — Z4682 Encounter for fitting and adjustment of non-vascular catheter: Secondary | ICD-10-CM | POA: Diagnosis not present

## 2023-07-22 DIAGNOSIS — I2699 Other pulmonary embolism without acute cor pulmonale: Secondary | ICD-10-CM | POA: Diagnosis not present

## 2023-07-22 DIAGNOSIS — I5043 Acute on chronic combined systolic (congestive) and diastolic (congestive) heart failure: Secondary | ICD-10-CM | POA: Diagnosis not present

## 2023-07-22 DIAGNOSIS — J9601 Acute respiratory failure with hypoxia: Secondary | ICD-10-CM | POA: Diagnosis not present

## 2023-07-22 DIAGNOSIS — E875 Hyperkalemia: Secondary | ICD-10-CM | POA: Diagnosis not present

## 2023-07-22 DIAGNOSIS — Z1152 Encounter for screening for COVID-19: Secondary | ICD-10-CM | POA: Diagnosis not present

## 2023-07-22 DIAGNOSIS — R570 Cardiogenic shock: Secondary | ICD-10-CM | POA: Diagnosis not present

## 2023-07-22 DIAGNOSIS — R0602 Shortness of breath: Secondary | ICD-10-CM | POA: Diagnosis not present

## 2023-07-22 DIAGNOSIS — I462 Cardiac arrest due to underlying cardiac condition: Secondary | ICD-10-CM | POA: Diagnosis not present

## 2023-07-22 DIAGNOSIS — N170 Acute kidney failure with tubular necrosis: Secondary | ICD-10-CM | POA: Diagnosis not present

## 2023-07-22 DIAGNOSIS — Z452 Encounter for adjustment and management of vascular access device: Secondary | ICD-10-CM | POA: Diagnosis not present

## 2023-07-22 DIAGNOSIS — J9811 Atelectasis: Secondary | ICD-10-CM | POA: Diagnosis not present

## 2023-07-22 DIAGNOSIS — E872 Acidosis, unspecified: Secondary | ICD-10-CM | POA: Diagnosis not present

## 2023-07-22 DIAGNOSIS — I63441 Cerebral infarction due to embolism of right cerebellar artery: Secondary | ICD-10-CM | POA: Diagnosis not present

## 2023-07-22 DIAGNOSIS — D72829 Elevated white blood cell count, unspecified: Secondary | ICD-10-CM

## 2023-07-22 DIAGNOSIS — I469 Cardiac arrest, cause unspecified: Secondary | ICD-10-CM | POA: Diagnosis not present

## 2023-07-22 DIAGNOSIS — R079 Chest pain, unspecified: Secondary | ICD-10-CM | POA: Diagnosis not present

## 2023-07-22 LAB — BASIC METABOLIC PANEL
Anion gap: 11 (ref 5–15)
Anion gap: 13 (ref 5–15)
BUN: 54 mg/dL — ABNORMAL HIGH (ref 6–20)
BUN: 53 mg/dL — ABNORMAL HIGH (ref 6–20)
CO2: 22 mmol/L (ref 22–32)
CO2: 20 mmol/L — ABNORMAL LOW (ref 22–32)
Calcium: 8.5 mg/dL — ABNORMAL LOW (ref 8.9–10.3)
Calcium: 8.5 mg/dL — ABNORMAL LOW (ref 8.9–10.3)
Chloride: 99 mmol/L (ref 98–111)
Chloride: 96 mmol/L — ABNORMAL LOW (ref 98–111)
Creatinine, Ser: 3.29 mg/dL — ABNORMAL HIGH (ref 0.61–1.24)
Creatinine, Ser: 3.13 mg/dL — ABNORMAL HIGH (ref 0.61–1.24)
GFR, Estimated: 24 mL/min — ABNORMAL LOW (ref >60)
GFR, Estimated: 25 mL/min — ABNORMAL LOW (ref >60)
Glucose, Bld: 133 mg/dL — ABNORMAL HIGH (ref 70–99)
Glucose, Bld: 166 mg/dL — ABNORMAL HIGH (ref 70–99)
Potassium: 4 mmol/L (ref 3.5–5.1)
Potassium: 5.1 mmol/L (ref 3.5–5.1)
Sodium: 132 mmol/L — ABNORMAL LOW (ref 135–145)
Sodium: 129 mmol/L — ABNORMAL LOW (ref 135–145)

## 2023-07-22 LAB — RENAL FUNCTION PANEL
Albumin: 2.1 g/dL — ABNORMAL LOW (ref 3.5–5.0)
Albumin: 2.4 g/dL — ABNORMAL LOW (ref 3.5–5.0)
Anion gap: 11 (ref 5–15)
Anion gap: 10 (ref 5–15)
BUN: 55 mg/dL — ABNORMAL HIGH (ref 6–20)
BUN: 53 mg/dL — ABNORMAL HIGH (ref 6–20)
CO2: 21 mmol/L — ABNORMAL LOW (ref 22–32)
CO2: 20 mmol/L — ABNORMAL LOW (ref 22–32)
Calcium: 8.4 mg/dL — ABNORMAL LOW (ref 8.9–10.3)
Calcium: 8.2 mg/dL — ABNORMAL LOW (ref 8.9–10.3)
Chloride: 100 mmol/L (ref 98–111)
Chloride: 98 mmol/L (ref 98–111)
Creatinine, Ser: 3.32 mg/dL — ABNORMAL HIGH (ref 0.61–1.24)
Creatinine, Ser: 3.05 mg/dL — ABNORMAL HIGH (ref 0.61–1.24)
GFR, Estimated: 23 mL/min — ABNORMAL LOW (ref >60)
GFR, Estimated: 26 mL/min — ABNORMAL LOW (ref >60)
Glucose, Bld: 132 mg/dL — ABNORMAL HIGH (ref 70–99)
Glucose, Bld: 160 mg/dL — ABNORMAL HIGH (ref 70–99)
Phosphorus: 1.8 mg/dL — ABNORMAL LOW (ref 2.5–4.6)
Phosphorus: 2.7 mg/dL (ref 2.5–4.6)
Potassium: 4 mmol/L (ref 3.5–5.1)
Potassium: 4.7 mmol/L (ref 3.5–5.1)
Sodium: 132 mmol/L — ABNORMAL LOW (ref 135–145)
Sodium: 128 mmol/L — ABNORMAL LOW (ref 135–145)

## 2023-07-22 LAB — HEPATIC FUNCTION PANEL
ALT: 16 U/L (ref 0–44)
AST: 37 U/L (ref 15–41)
Albumin: 2.1 g/dL — ABNORMAL LOW (ref 3.5–5.0)
Alkaline Phosphatase: 135 U/L — ABNORMAL HIGH (ref 38–126)
Bilirubin, Direct: 0.4 mg/dL — ABNORMAL HIGH (ref 0.0–0.2)
Indirect Bilirubin: 0.9 mg/dL (ref 0.3–0.9)
Total Bilirubin: 1.3 mg/dL — ABNORMAL HIGH (ref 0.0–1.2)
Total Protein: 6.8 g/dL (ref 6.5–8.1)

## 2023-07-22 LAB — POCT I-STAT 7, (LYTES, BLD GAS, ICA,H+H)
Acid-base deficit: 2 mmol/L (ref 0.0–2.0)
Acid-base deficit: 4 mmol/L — ABNORMAL HIGH (ref 0.0–2.0)
Bicarbonate: 21.5 mmol/L (ref 20.0–28.0)
Bicarbonate: 21.1 mmol/L (ref 20.0–28.0)
Calcium, Ion: 1.14 mmol/L — ABNORMAL LOW (ref 1.15–1.40)
Calcium, Ion: 1.12 mmol/L — ABNORMAL LOW (ref 1.15–1.40)
HCT: 26 % — ABNORMAL LOW (ref 39.0–52.0)
HCT: 30 % — ABNORMAL LOW (ref 39.0–52.0)
Hemoglobin: 8.8 g/dL — ABNORMAL LOW (ref 13.0–17.0)
Hemoglobin: 10.2 g/dL — ABNORMAL LOW (ref 13.0–17.0)
O2 Saturation: 98 %
O2 Saturation: 92 %
Patient temperature: 37.1
Patient temperature: 37.7
Potassium: 4 mmol/L (ref 3.5–5.1)
Potassium: 5.1 mmol/L (ref 3.5–5.1)
Sodium: 134 mmol/L — ABNORMAL LOW (ref 135–145)
Sodium: 131 mmol/L — ABNORMAL LOW (ref 135–145)
TCO2: 22 mmol/L (ref 22–32)
TCO2: 22 mmol/L (ref 22–32)
pCO2 arterial: 29.9 mmHg — ABNORMAL LOW (ref 32–48)
pCO2 arterial: 38 mmHg (ref 32–48)
pH, Arterial: 7.467 — ABNORMAL HIGH (ref 7.35–7.45)
pH, Arterial: 7.356 (ref 7.35–7.45)
pO2, Arterial: 100 mmHg (ref 83–108)
pO2, Arterial: 69 mmHg — ABNORMAL LOW (ref 83–108)

## 2023-07-22 LAB — GLUCOSE, CAPILLARY
Glucose-Capillary: 125 mg/dL — ABNORMAL HIGH (ref 70–99)
Glucose-Capillary: 126 mg/dL — ABNORMAL HIGH (ref 70–99)
Glucose-Capillary: 127 mg/dL — ABNORMAL HIGH (ref 70–99)
Glucose-Capillary: 127 mg/dL — ABNORMAL HIGH (ref 70–99)
Glucose-Capillary: 143 mg/dL — ABNORMAL HIGH (ref 70–99)
Glucose-Capillary: 149 mg/dL — ABNORMAL HIGH (ref 70–99)
Glucose-Capillary: 160 mg/dL — ABNORMAL HIGH (ref 70–99)

## 2023-07-22 LAB — TYPE AND SCREEN
ABO/RH(D): O POS
ABO/RH(D): O POS
Antibody Screen: NEGATIVE
Antibody Screen: NEGATIVE
Unit division: 0

## 2023-07-22 LAB — MAGNESIUM: Magnesium: 2.6 mg/dL — ABNORMAL HIGH (ref 1.7–2.4)

## 2023-07-22 LAB — BPAM RBC
Blood Product Expiration Date: 202503272359
ISSUE DATE / TIME: 202503100838
Unit Type and Rh: 5100

## 2023-07-22 LAB — CBC
HCT: 25.3 % — ABNORMAL LOW (ref 39.0–52.0)
Hemoglobin: 8.2 g/dL — ABNORMAL LOW (ref 13.0–17.0)
MCH: 28.7 pg (ref 26.0–34.0)
MCHC: 32.4 g/dL (ref 30.0–36.0)
MCV: 88.5 fL (ref 80.0–100.0)
Platelets: 215 10*3/uL (ref 150–400)
RBC: 2.86 MIL/uL — ABNORMAL LOW (ref 4.22–5.81)
RDW: 20.8 % — ABNORMAL HIGH (ref 11.5–15.5)
WBC: 30.4 10*3/uL — ABNORMAL HIGH (ref 4.0–10.5)
nRBC: 0.1 % (ref 0.0–0.2)

## 2023-07-22 LAB — COOXEMETRY PANEL
Carboxyhemoglobin: 2.2 % — ABNORMAL HIGH (ref 0.5–1.5)
Methemoglobin: 1.2 % (ref 0.0–1.5)
O2 Saturation: 59 %
Total hemoglobin: 8.6 g/dL — ABNORMAL LOW (ref 12.0–16.0)

## 2023-07-22 LAB — FIBRINOGEN: Fibrinogen: >800 mg/dL — ABNORMAL HIGH (ref 210–475)

## 2023-07-22 LAB — HEPARIN LEVEL (UNFRACTIONATED)
Heparin Unfractionated: 0.31 [IU]/mL (ref 0.30–0.70)
Heparin Unfractionated: 0.35 [IU]/mL (ref 0.30–0.70)

## 2023-07-22 LAB — TROPONIN I (HIGH SENSITIVITY): Troponin I (High Sensitivity): 30 ng/L — ABNORMAL HIGH (ref <18)

## 2023-07-22 LAB — LACTATE DEHYDROGENASE: LDH: 633 U/L — ABNORMAL HIGH (ref 98–192)

## 2023-07-22 LAB — APTT: aPTT: 124 s — ABNORMAL HIGH (ref 24–36)

## 2023-07-22 MED ORDER — AMIODARONE LOAD VIA INFUSION
150.0000 mg | Freq: Once | INTRAVENOUS | Status: AC
  Administered 2023-07-22: 150 mg via INTRAVENOUS

## 2023-07-22 MED ORDER — DOCUSATE SODIUM 50 MG/5ML PO LIQD: 100.0000 mg | Freq: Two times a day (BID) | ORAL | Status: DC | PRN

## 2023-07-22 MED ORDER — ROCURONIUM BROMIDE 10 MG/ML (PF) SYRINGE
80.0000 mg | PREFILLED_SYRINGE | Freq: Once | INTRAVENOUS | Status: AC
  Administered 2023-07-22: 80 mg via INTRAVENOUS
  Filled 2023-07-22: qty 10

## 2023-07-22 MED ORDER — MUPIROCIN 2 % EX OINT
1.0000 | TOPICAL_OINTMENT | Freq: Two times a day (BID) | CUTANEOUS | Status: AC
  Administered 2023-07-23 – 2023-07-27 (×9): 1 via NASAL
  Filled 2023-07-22: qty 22

## 2023-07-22 MED ORDER — POLYETHYLENE GLYCOL 3350 17 G PO PACK: 17.0000 g | PACK | Freq: Every day | ORAL | Status: DC | PRN

## 2023-07-22 MED ORDER — BANATROL TF EN LIQD
60.0000 mL | Freq: Two times a day (BID) | ENTERAL | Status: DC
  Administered 2023-07-22 – 2023-07-30 (×15): 60 mL
  Filled 2023-07-22 (×16): qty 60

## 2023-07-22 MED ORDER — PROSOURCE TF20 ENFIT COMPATIBL EN LIQD
60.0000 mL | Freq: Two times a day (BID) | ENTERAL | Status: DC
  Administered 2023-07-24 – 2023-07-31 (×15): 60 mL
  Filled 2023-07-22 (×17): qty 60

## 2023-07-22 MED ORDER — PIVOT 1.5 CAL PO LIQD: 1000.0000 mL | ORAL | Status: DC

## 2023-07-22 MED ORDER — SODIUM PHOSPHATES 45 MMOLE/15ML IV SOLN
30.0000 mmol | Freq: Once | INTRAVENOUS | Status: AC
  Administered 2023-07-22: 30 mmol via INTRAVENOUS
  Filled 2023-07-22: qty 10

## 2023-07-22 MED ORDER — PROPOFOL 1000 MG/100ML IV EMUL
5.0000 ug/kg/min | INTRAVENOUS | Status: DC
  Administered 2023-07-22: 55 ug/kg/min via INTRAVENOUS
  Administered 2023-07-23: 35 ug/kg/min via INTRAVENOUS
  Administered 2023-07-23 (×2): 55 ug/kg/min via INTRAVENOUS
  Filled 2023-07-22 (×5): qty 100

## 2023-07-22 NOTE — Progress Notes (Signed)
 NAME:  Aaron [name], MRN:  [id number], DOB:  1986/03/27, LOS: 13 ADMISSION DATE:  07/09/2023, CONSULTATION DATE:  07/11/2023 REFERRING MD: Clearnce Hasten, CHIEF COMPLAINT: Status post cardiac arrest  History of Present Illness:  38 year old male with hypothyroidism, paroxysmal A-fib, severe mitral regurgitation who presented to [facility] long hospital with chest pain and shortness of breath for few days associated cough, nausea and diarrhea.  In the emergency department he was diagnosed with PE, was started on IV heparin.  He takes beta-blocker, steroid and methimazole for hyperthyroidism.  On 2/27 patient rapidly deteriorated developed V. tach/V-fib cardiac arrest patient was intubated after 40 minutes of CPR, cardiology was consulted patient was placed on VA ECMO and was transferred to [facility]  Pertinent  Medical History   Past Medical History:  Diagnosis Date   Atrial fibrillation [facility])    Hyperthyroidism    Mitral regurgitation    NSTEMI (non-ST elevated myocardial infarction) (HCC)     Significant Hospital Events: Including procedures, antibiotic start and stop dates in addition to other pertinent events   3/5 salvage TEER (transcatheter edge to edge mitral valve repair 3/7 weaned off VA  Interim History / Subjective:  Woke up a bit overnight and CVP down, pressors up. Able to get iNO to 10ppm.  Objective   Blood pressure (!) 141/71, pulse (!) 56, temperature 98.2 F (36.8 C), resp. rate 20, height 6\' 4"  (1.93 m), weight 90.2 kg, SpO2 99%. PAP: (40-65)/(13-32) 54/15 CVP:  [1 mmHg-14 mmHg] 2 mmHg CO:  [6.3 L/min-10.4 L/min] 6.3 L/min CI:  [2.77 L/min/m2-4.56 L/min/m2] 2.77 L/min/m2  Vent Mode: PRVC FiO2 (%):  [40 %-50 %] 40 % Set Rate:  [20 bmp-30 bmp] 20 bmp Vt Set:  [610 mL] 610 mL PEEP:  [5 cmH20-10 cmH20] 5 cmH20 Plateau Pressure:  [27 cmH20] 27 cmH20   Intake/Output Summary (Last 24 hours) at 07/22/2023 0733 Last data filed at 07/22/2023 0700 Gross per 24  hour  Intake 5397.91 ml  Output 5934.3 ml  Net -536.39 ml   Filed Weights   07/20/23 0500 07/21/23 0500 07/22/23 0500  Weight: 90.3 kg 90.8 kg 90.2 kg    Examination: More diminished breath sounds on L Heavily sedated Pupils small reactive Abd soft Line sites look fine Ext warm, unna in place  WBC up to 30   Resolved Hospital Problem list   Lactic acidosis, resolved Refractory hyperkalemia, improving  Assessment & Plan:  Baseline severe mitral insufficiency with superimposed RV>LV shock state culminating in prolonged IHCA s/p VA ECMO 2/27- salvage TEER 3/5, weaned from [place] to impella 3/7. Now looking closer to be able to wean impella. Post arrest encephalopathy- following commands a few days ago per nursing, neg head imaging; will need to be a rapid wean once ready to extubate Acute renal failure- now requiring CRRT Coagulopathy and hemolysis- improving Thyrotoxicosis- on methimazole, stress steroids; stress steroids off on 3/8 Shock liver- improving Ongoing severe multifactorial pulmonary HTN- remains on iNO Leukocytosis- micafungin re-added 3/10; already on vanc/meropenem; fever curve improved Left lung mucus plugging- improved after bronch 3/8; apparently per wife he always coughs up brown stuff, also smokes brown blunts of weed.  Tracheal aspirate 2/28 growing MSSA; 3/8 BAL pending; reduced breath sounds again 3/11, check CXR  Bival per PharmD Sedation for vent synchrony Reducing CRRT pull so we can wean pressors F/u 3/8 BAL, antimicrobials as ordered for now, trend WBC/fever curve iNO keep at 10ppm, can wean further once impella out Heparin per pharmacy Vent wean  once we get iNO and impella out, will wake up hard and need to watch his [place] Funk which coincides with it  Best Practice (right click and "Reselect all SmartList Selections" daily)   Diet/type: NPO TF DVT prophylaxis systemic bival Pressure ulcer(s): N/A GI prophylaxis: PPI Lines: Central line, Arterial  Line, and yes and it is still needed.   Foley:  Yes, and it is still needed Code Status:  full code Last date of multidisciplinary goals of care discussion [Per primary team]  33 min cc time Myrla Halsted MD PCCM

## 2023-07-22 NOTE — Progress Notes (Signed)
 PHARMACY - ANTICOAGULATION CONSULT NOTE  Pharmacy Consult for Heparin  Indication: pulmonary embolus, Impella, s/p ECMO decannulation   No Known Allergies  Patient Measurements: Height: 6\' 4"  (193 cm) Weight: 90.2 kg (198 lb 13.7 oz) IBW/kg (Calculated) : 86.8 Heparin Dosing Weight: n/a  Vital Signs: Temp: 98.2 F (36.8 C) (03/11 0700) Temp Source: Core (03/10 2000) Pulse Rate: 56 (03/11 0700)  Labs: Recent Labs    07/20/23 0501 07/20/23 1432 07/20/23 1624 07/21/23 0418 07/21/23 1328 07/21/23 1608 07/21/23 1732 07/21/23 2249 07/22/23 0412 07/22/23 0415  HGB 7.6*  --    < > 7.9*  --   --  9.2*  --  8.2* 8.8*  HCT 22.6*  --    < > 24.0*  --   --  27.0*  --  25.3* 26.0*  PLT 214  --   --  262  --   --   --   --  215  --   APTT  --  73*  --   --   --   --   --   --  124*  --   HEPARINUNFRC <0.10* <0.10*  --  <0.10* 0.33  --   --  0.32 0.31  --   CREATININE 4.88*  --    < > 3.59*  --  3.40*  3.46*  --   --  3.29*  3.32*  --    < > = values in this interval not displayed.    Estimated Creatinine Clearance: 37.4 mL/min (A) (by C-G formula based on SCr of 3.29 mg/dL (H)).   Medical History: Past Medical History:  Diagnosis Date   Atrial fibrillation (HCC)    Hyperthyroidism    Mitral regurgitation    NSTEMI (non-ST elevated myocardial infarction) [facility])     Assessment: 38 yo M with bilateral PE s/p TNK (2/27 @1156 ) now on [place] ECMO + Impella. Pharmacy consulted for bivalirudin for anticoagulation.   S/p impella CP >5.5 3/3 - bivalirudin was previously held with oozing at insertion site but restart 3/4. Patient underwent mitraclip 3/5.  Heparin level is therapeutic at 0.31, on heparin infusion at 1850 units/hr - of note, also receiving heparin in CRRT circuit at 500 units/hr given has been clotting off CRRT filter. Hgb 8.2, plt 215. Fibrinogen >800, LDH 633. No s/sx of bleeding. Remains on P3 with Impella 5.5   Goal of Therapy:  Heparin level 0.3-0.5  units/mL Monitor platelets by anticoagulation protocol: Yes   Plan:  Continue heparin at 1850 units/hr + 500 units/hr in CRRT circuit  Monitor q12 hr heparin level, CBC, and for s/sx of bleeding  Thank you for allowing pharmacy to participate in this patient's care,  Sherron Monday, PharmD, BCCCP Clinical Pharmacist  Phone: [phone] 07/22/2023 7:42 AM  Please check AMION for all [facility] Pharmacy phone numbers After 10:00 PM, call Main Pharmacy [phone]

## 2023-07-22 NOTE — H&P (View-Only) (Signed)
      [address]       [name] [address]             [phone]      Aaron [name] is now down to just milrinone at 0.125  Intubated and sedated  CO 7.2 L/min with Impella at p3 1.5 L/min  D/w Dr. Shirlee Latch- plan to  remove the Impella tomorrow AM  I discussed the proposed procedure with Aaron [name] sister and his fiancee.  I informed them of the risks and benefits.  They understand high risk even of transport and relatively minor procedures given his overall status.  Particular risks include death, cardiac arrhythmias, bleeding and infection.  Salvatore Decent Dorris Fetch, MD [facility] [phone]

## 2023-07-22 NOTE — Progress Notes (Signed)
      [address]       [name] [address]             [phone]      Aaron [name] is now down to just milrinone at 0.125  Intubated and sedated  CO 7.2 L/min with Impella at p3 1.5 L/min  D/w Dr. Shirlee Latch- plan to  remove the Impella tomorrow AM  I discussed the proposed procedure with Mr. Morais sister and his fiancee.  I informed them of the risks and benefits.  They understand high risk even of transport and relatively minor procedures given his overall status.  Particular risks include death, cardiac arrhythmias, bleeding and infection.  Salvatore Decent Dorris Fetch, MD [facility] [phone]

## 2023-07-22 NOTE — Progress Notes (Signed)
 PHARMACY - ANTICOAGULATION CONSULT NOTE  Pharmacy Consult for Heparin  Indication: pulmonary embolus, Impella, s/p ECMO decannulation   No Known Allergies  Patient Measurements: Height: 6\' 4"  (193 cm) Weight: 90.2 kg (198 lb 13.7 oz) IBW/kg (Calculated) : 86.8 Heparin Dosing Weight: n/a  Vital Signs: Temp: 99.9 F (37.7 C) (03/11 1715) Temp Source: Core (03/11 1300) Pulse Rate: 90 (03/11 1715)  Labs: Recent Labs    07/20/23 0501 07/20/23 1432 07/20/23 1624 07/21/23 0418 07/21/23 1328 07/21/23 1608 07/21/23 1732 07/21/23 2249 07/22/23 0412 07/22/23 0415 07/22/23 1511 07/22/23 1636  HGB 7.6*  --    < > 7.9*  --   --  9.2*  --  8.2* 8.8*  --   --   HCT 22.6*  --    < > 24.0*  --   --  27.0*  --  25.3* 26.0*  --   --   PLT 214  --   --  262  --   --   --   --  215  --   --   --   APTT  --  73*  --   --   --   --   --   --  124*  --   --   --   HEPARINUNFRC <0.10* <0.10*  --  <0.10*   < >  --   --  0.32 0.31  --   --  0.35  CREATININE 4.88*  --    < > 3.59*  --  3.40*  3.46*  --   --  3.29*  3.32*  --  3.05*  --    < > = values in this interval not displayed.    Estimated Creatinine Clearance: 40.3 mL/min (A) (by C-G formula based on SCr of 3.05 mg/dL (H)).   Medical History: Past Medical History:  Diagnosis Date   Atrial fibrillation [facility])    Hyperthyroidism    Mitral regurgitation    NSTEMI (non-ST elevated myocardial infarction) [facility])     Assessment: 38 yo M with bilateral PE s/p TNK (2/27 @1156 ) now on [place] ECMO + Impella. Pharmacy consulted for bivalirudin for anticoagulation.   S/p impella CP >5.5 3/3 - bivalirudin was previously held with oozing at insertion site but restart 3/4. Patient underwent mitraclip 3/5.  Heparin level remains therapeutic (0.35) on heparin infusion at 1850 units/hr - of note, also receiving heparin in CRRT circuit at 500 units/hr given has been clotting off CRRT filter. No s/sx of bleeding. Remains on P3 with Impella 5.5   Goal  of Therapy:  Heparin level 0.3-0.5 units/mL Monitor platelets by anticoagulation protocol: Yes   Plan:  Continue heparin at 1850 units/hr + 500 units/hr in CRRT circuit  Monitor q12 hr heparin level, CBC, and for s/sx of bleeding  Thank you for allowing pharmacy to participate in this patient's care,  Christoper Fabian, PharmD, BCPS Please see amion for complete clinical pharmacist phone list 07/22/2023 5:39 PM

## 2023-07-22 NOTE — Progress Notes (Signed)
 PHARMACY - ANTICOAGULATION Pharmacy Consult for Heparin  Indication: pulmonary embolus and Impella Brief A/P: Heparin level within goal range Continue Heparin at current rate   No Known Allergies  Patient Measurements: Height: 6\' 4"  (193 cm) Weight: 90.8 kg (200 lb 2.8 oz) IBW/kg (Calculated) : 86.8 Heparin Dosing Weight: n/a  Vital Signs: Temp: 96.8 F (36 C) (03/10 1900) Pulse Rate: 74 (03/10 1845)  Labs: Recent Labs    07/19/23 0035 07/19/23 0347 07/19/23 0358 07/19/23 0402 07/20/23 0501 07/20/23 1432 07/20/23 1624 07/20/23 1649 07/21/23 0416 07/21/23 0418 07/21/23 1328 07/21/23 1608 07/21/23 1732 07/21/23 2249  HGB  --  8.3*  --    < > 7.6*  --   --    < > 8.5* 7.9*  --   --  9.2*  --   HCT  --  25.1*  --    < > 22.6*  --   --    < > 25.0* 24.0*  --   --  27.0*  --   PLT  --  188  --   --  214  --   --   --   --  262  --   --   --   --   APTT 49*  --   --   --   --  73*  --   --   --   --   --   --   --   --   LABPROT  --   --  15.3*  --   --   --   --   --   --   --   --   --   --   --   INR  --   --  1.2  --   --   --   --   --   --   --   --   --   --   --   HEPARINUNFRC <0.10*  --   --    < > <0.10* <0.10*  --   --   --  <0.10* 0.33  --   --  0.32  CREATININE  --   --  5.67*   < > 4.88*  --  4.05*  --   --  3.59*  --  3.40*  3.46*  --   --    < > = values in this interval not displayed.    Estimated Creatinine Clearance: 36.2 mL/min (A) (by C-G formula based on SCr of 3.4 mg/dL (H)).  Assessment: 38 yo male with bilateral PE s/p TNK 2/27 on Impella for heparin.   Goal of Therapy:  Heparin level 0.3-0.5 units/mL Monitor platelets by anticoagulation protocol: Yes   Plan:  No change to heparin  Geannie Risen, PharmD, BCPS

## 2023-07-22 NOTE — Progress Notes (Signed)
 4 Days Post-Op Procedure(s) (LRB): DECANNULATION FOR  ECMO (Extracorporeal Membrane Oxygenation) (N/A) EMBOLECTOMY Left Femoral, Popliteal and iliac arterys,  Repair Left common femoral artery. (Left) Subjective: Intubated, sedated  Objective: Vital signs in last 24 hours: Temp:  [96.8 F (36 C)-99.5 F (37.5 C)] 98.8 F (37.1 C) (03/11 0900) Pulse Rate:  [56-100] 79 (03/11 0900) Cardiac Rhythm: Normal sinus rhythm;Heart block (03/11 0800) Resp:  [18-33] 19 (03/11 0900) SpO2:  [92 %-100 %] 95 % (03/11 0900) Arterial Line BP: (66-157)/(37-76) 134/64 (03/11 0900) FiO2 (%):  [40 %-50 %] 40 % (03/11 0800) Weight:  [90.2 kg] 90.2 kg (03/11 0500)  Hemodynamic parameters for last 24 hours: PAP: (40-69)/(13-32) 69/27 CVP:  [1 mmHg-14 mmHg] 11 mmHg CO:  [6.3 L/min-10.4 L/min] 6.6 L/min CI:  [2.77 L/min/m2-4.56 L/min/m2] 2.9 L/min/m2  Intake/Output from previous day: 03/10 0701 - 03/11 0700 In: 5450.9 [I.V.:2378.2; NG/GT:2110; IV Piggyback:710.3] Out: 8135.3 [Stool:1000] Intake/Output this shift: Total I/O In: 557.6 [I.V.:238; Other:21.2; NG/GT:190; IV Piggyback:108.5] Out: 647   General appearance: Intubated Neurologic: Sedated Heart: No murmur Lungs: Absent breath sounds on left Impella in place  Lab Results: Recent Labs    07/21/23 0418 07/21/23 1732 07/22/23 0412 07/22/23 0415  WBC 26.0*  --  30.4*  --   HGB 7.9*   < > 8.2* 8.8*  HCT 24.0*   < > 25.3* 26.0*  PLT 262  --  215  --    < > = values in this interval not displayed.   BMET:  Recent Labs    07/21/23 1608 07/21/23 1732 07/22/23 0412 07/22/23 0415  NA 131*  131*   < > 132*  132* 134*  K 4.1  4.2   < > 4.0  4.0 4.0  CL 99  99  --  99  100  --   CO2 21*  21*  --  22  21*  --   GLUCOSE 118*  119*  --  133*  132*  --   BUN 56*  56*  --  54*  55*  --   CREATININE 3.40*  3.46*  --  3.29*  3.32*  --   CALCIUM 7.9*  7.9*  --  8.5*  8.4*  --    < > = values in this interval not displayed.     PT/INR: No results for input(s): "LABPROT", "INR" in the last 72 hours. ABG    Component Value Date/Time   PHART 7.467 (H) 07/22/2023 0415   HCO3 21.5 07/22/2023 0415   TCO2 22 07/22/2023 0415   ACIDBASEDEF 2.0 07/22/2023 0415   O2SAT 59 07/22/2023 0427   CBG (last 3)  Recent Labs    07/22/23 0023 07/22/23 0413 07/22/23 0752  GLUCAP 149* 125* 126*    Assessment/Plan: S/P Procedure(s) (LRB): DECANNULATION FOR  ECMO (Extracorporeal Membrane Oxygenation) (N/A) EMBOLECTOMY Left Femoral, Popliteal and iliac arterys,  Repair Left common femoral artery. (Left) Remains critically ill. Impella remains in place at P3 with 1.5 L/min flow. Required restarting norepinephrine overnight.  Remains on 0.125 mcg/kg/min of milrinone. Absent breath sounds on the left, chest x-ray pending, no increased O2 requirement White blood cell count up to 26,000-30,000 Will hold off on Impella removal for today, tentatively plan for tomorrow if overall condition improves.   LOS: 13 days    Loreli Slot 07/22/2023

## 2023-07-22 NOTE — Progress Notes (Signed)
 [facility] NEPHROLOGY PROGRESS NOTE  Assessment/ Plan: Pt is a 38 y.o. yo male  likely ATN after SCA, cardiogenic shock on ECMO and Impella, AHRF/VDRF   # Dialysis dependent anuric AKI 2/2 ATN from cardiogenic shock on CRRT since 07/11/23, Left subclavian temp HD cath placed by CCM;  CVP around 5. All 4 K, UF goal reduced to 50 cc./ hr.   # Cardiogenic Shock on  Impella; status post transcutaneous mitral valve repair; previously on ECMO. per cardiology.  # VT + PEA SCA, as per cardiology team. # PE on heparin per CCM # Anemia, Hb stable, per AHF # Hyperkalemia, less present after ECMO decannulation # Severe MR s/p TEER 3/5  Subjective: Seen and examined and ICU.  No problems related to decubitus.   CVP around 5 therefore UF goal reduced to 50 cc an hour.  Remains intubated, sedated and on pressors.  Discussed with ICU nurse.  Objective Vital signs in last 24 hours: Vitals:   07/22/23 0800 07/22/23 0845 07/22/23 0900 07/22/23 0915  BP:      Pulse: 61 72 79 77  Resp: 18 18 19 18   Temp: 98.4 F (36.9 C) 98.8 F (37.1 C) 98.8 F (37.1 C) 98.8 F (37.1 C)  TempSrc: Core (Comment)     SpO2: 96% 96% 95% 96%  Weight:      Height:       Weight change: -0.6 kg  Intake/Output Summary (Last 24 hours) at 07/22/2023 0931 Last data filed at 07/22/2023 0900 Gross per 24 hour  Intake 5584.68 ml  Output 8518.3 ml  Net -2933.62 ml       Labs: RENAL PANEL Recent Labs  Lab 07/18/23 0351 07/18/23 0356 07/19/23 0358 07/19/23 0402 07/20/23 0501 07/20/23 1624 07/20/23 1649 07/21/23 0418 07/21/23 1608 07/21/23 1732 07/22/23 0412 07/22/23 0415  NA 138  137   < > 136   < > 134* 133*   < > 134* 131*  131* 133* 132*  132* 134*  K 4.2  4.1   < > 4.6   < > 3.6 3.7   < > 3.8 4.1  4.2 4.3 4.0  4.0 4.0  CL 102  101   < > 101   < > 100 101  --  99 99  99  --  99  100  --   CO2 24  24   < > 23   < > 21* 21*  --  22 21*  21*  --  22  21*  --   GLUCOSE 145*   142*   < > 146*   < > 135* 131*  --  121* 118*  119*  --  133*  132*  --   BUN 68*  68*   < > 93*   < > 81* 64*  --  58* 56*  56*  --  54*  55*  --   CREATININE 3.59*  3.64*   < > 5.67*   < > 4.88* 4.05*  --  3.59* 3.40*  3.46*  --  3.29*  3.32*  --   CALCIUM 8.3*  8.3*   < > 8.1*   < > 7.9* 7.9*  --  8.3* 7.9*  7.9*  --  8.5*  8.4*  --   MG 2.8*  --  2.6*  --  2.3  --   --  2.6*  --   --  2.6*  --   PHOS 2.4*   < > 3.4   < >  3.6 2.8  --  3.0 2.9  --  1.8*  --   ALBUMIN 2.9*  2.9*   < > 2.8*  2.8*   < > 2.3* 2.2*  --  2.3*  2.3* 2.4*  --  2.1*  2.1*  --    < > = values in this interval not displayed.    Liver Function Tests: Recent Labs  Lab 07/20/23 0501 07/20/23 1624 07/21/23 0418 07/21/23 1608 07/22/23 0412  AST 44*  --  36  --  37  ALT 16  --  15  --  16  ALKPHOS 102  --  119  --  135*  BILITOT 2.0*  --  1.5*  --  1.3*  PROT 6.3*  --  7.1  --  6.8  ALBUMIN 2.3*   < > 2.3*  2.3* 2.4* 2.1*  2.1*   < > = values in this interval not displayed.   No results for input(s): "LIPASE", "AMYLASE" in the last 168 hours. No results for input(s): "AMMONIA" in the last 168 hours. CBC: Recent Labs    07/21/23 0416 07/21/23 0418 07/21/23 1732 07/22/23 0412 07/22/23 0415  HGB 8.5* 7.9* 9.2* 8.2* 8.8*  MCV  --  87.6  --  88.5  --     Cardiac Enzymes: No results for input(s): "CKTOTAL", "CKMB", "CKMBINDEX", "TROPONINI" in the last 168 hours. CBG: Recent Labs  Lab 07/21/23 1657 07/21/23 1944 07/22/23 0023 07/22/23 0413 07/22/23 0752  GLUCAP 129* 127* 149* 125* 126*    Iron Studies: No results for input(s): "IRON", "TIBC", "TRANSFERRIN", "FERRITIN" in the last 72 hours. Studies/Results: ECHOCARDIOGRAM LIMITED Result Date: 07/21/2023    ECHOCARDIOGRAM LIMITED REPORT   Patient Name:   Aaron [name] Date of Exam: 07/21/2023 Medical Rec #:  [id number]    Height:       76.0 in Accession #:    [id number]   Weight:       200.2 lb Date of Birth:  [birth year]    BSA:           2.216 m Patient Age:    37 years     BP:           0/0 mmHg Patient Gender: M            HR:           92 bpm. Exam Location:  Inpatient Procedure: Limited Echo, Limited Color Doppler and Cardiac Doppler (Both            Spectral and Color Flow Doppler were utilized during procedure). Indications:    CHF  History:        Patient has no prior history of Echocardiogram examinations and                 Patient has prior history of Echocardiogram examinations, most                 recent 07/19/2023. Mitral Valve Prolapse, Arrythmias:Cardiac                 Arrest and Atrial Fibrillation; Risk Factors:Hypertension.  Sonographer:    Amy Chionchio Referring Phys: 1 DALTON S MCLEAN IMPRESSIONS  1. Impella 5.5 in LV, inflow is about 5.5 cm from the aortic valve. Position looks acceptable. Left ventricular ejection fraction, by estimation, is 35 to 40%. The left ventricle has moderately decreased function. The left ventricle demonstrates regional wall motion abnormalities with anterior and septal hypokinesis. There is mild concentric left ventricular  hypertrophy.  2. Right ventricular systolic function is normal. The right ventricular size is normal.  3. S/p Mitraclip with mild residual mitral regurgitation. Mean gradient 7 mmHg.  4. Limited echo for Impella. FINDINGS  Left Ventricle: Impella 5.5 in LV, inflow is about 5.5 cm from the aortic valve. Position looks acceptable. Left ventricular ejection fraction, by estimation, is 35 to 40%. The left ventricle has moderately decreased function. The left ventricle demonstrates regional wall motion abnormalities. The left ventricular internal cavity size was normal in size. There is mild concentric left ventricular hypertrophy. Right Ventricle: The right ventricular size is normal. Right ventricular systolic function is normal. Left Atrium: Left atrial size was normal in size. Right Atrium: Right atrial size was normal in size. Mitral Valve: S/p Mitraclip with mild residual  mitral regurgitation. Mean gradient 7 mmHg. There is a Mitra-Clip present in the mitral position. MV peak gradient, 11.8 mmHg. The mean mitral valve gradient is 6.5 mmHg. Aorta: The aortic root and ascending aorta are structurally normal, with no evidence of dilitation. LEFT VENTRICLE PLAX 2D LVIDd:         4.60 cm LVIDs:         4.10 cm LV PW:         1.20 cm LV IVS:        1.20 cm  RIGHT VENTRICLE RV Basal diam:  3.70 cm RV Mid diam:    3.30 cm RV S prime:     13.40 cm/s TAPSE (M-mode): 1.9 cm RIGHT ATRIUM           Index RA Area:     16.70 cm RA Volume:   50.10 ml  22.61 ml/m   AORTA Ao Asc diam: 3.20 cm MITRAL VALVE MV Peak grad: 11.8 mmHg MV Mean grad: 6.5 mmHg MV Vmax:      1.72 m/s MV Vmean:     122.0 cm/s Dalton McleanMD Electronically signed by Wilfred Lacy Signature Date/Time: 07/21/2023/2:57:35 PM    Final    ECHOCARDIOGRAM LIMITED Result Date: 07/21/2023    ECHOCARDIOGRAM LIMITED REPORT   Patient Name:   Aaron [name] Date of Exam: 07/20/2023 Medical Rec #:  [id number]    Height:       76.0 in Accession #:    [id number]   Weight:       199.1 lb Date of Birth:  04-01-1986    BSA:          2.211 m Patient Age:    37 years     BP:           141/71 mmHg Patient Gender: M            HR:           58 bpm. Exam Location:  Inpatient Procedure: 2D Echo, Cardiac Doppler and Color Doppler (Both Spectral and Color            Flow Doppler were utilized during procedure). Indications:    I42.9 Cardiomyopathy (unspecified)  History:        Patient has prior history of Echocardiogram examinations, most                 recent 07/18/2023. Acute MI, Abnormal ECG, Mitral Valve Disease,                 Arrythmias:Atrial Fibrillation and Cardiac Arrest;                 Signs/Symptoms:Chest Pain. ECMO. Impella device present. S/P  MTEER X3 XTW clips. Pulmonary embolus. Cardiac shock.                  Mitral Valve: Mitra-Clip valve is present in the mitral                 position.  Sonographer:    Sheralyn Boatman  RDCS Referring Phys: [id number] [facility]  Sonographer Comments: Technically difficult study due to poor echo windows and echo performed with patient supine and on artificial respirator. IMPRESSIONS  1. Impella 3.7cm from the aortic valve annulus. Left ventricular ejection fraction, by estimation, is 35 to 40%. The left ventricle has moderately decreased function.  2. Right ventricular systolic function is mildly reduced. The right ventricular size is mildly enlarged.  3. Left atrial size was mildly dilated.  4. Right atrial size was mildly dilated.  5. The mitral valve has been repaired/replaced. Trivial mitral valve regurgitation. There is a Mitra-Clip present in the mitral position.  6. The inferior vena cava is dilated in size with <50% respiratory variability, suggesting right atrial pressure of 15 mmHg. FINDINGS  Left Ventricle: Impella 3.7cm from the aortic valve annulus. Left ventricular ejection fraction, by estimation, is 35 to 40%. The left ventricle has moderately decreased function. Right Ventricle: The right ventricular size is mildly enlarged. Right ventricular systolic function is mildly reduced. Left Atrium: Left atrial size was mildly dilated. Right Atrium: Right atrial size was mildly dilated. Pericardium: There is no evidence of pericardial effusion. Mitral Valve: The mitral valve has been repaired/replaced. Trivial mitral valve regurgitation. There is a Mitra-Clip present in the mitral position. Venous: The inferior vena cava is dilated in size with less than 50% respiratory variability, suggesting right atrial pressure of 15 mmHg. Additional Comments: Spectral Doppler performed. Color Doppler performed.  LEFT VENTRICLE PLAX 2D LVIDd:         5.00 cm LVIDs:         4.20 cm LV PW:         1.30 cm LV IVS:        1.70 cm  IVC IVC diam: 3.10 cm Aditya Sabharwal Electronically signed by Dorthula Nettles Signature Date/Time: 07/21/2023/10:51:41 AM    Final     Medications: Infusions:   amiodarone 60 mg/hr (07/22/23 0900)   dexmedetomidine (PRECEDEX) IV infusion 0.4 mcg/kg/hr (07/22/23 0900)   feeding supplement (PIVOT 1.5 CAL) 1,000 mL (07/22/23 0908)   heparin 10,000 units/ 20 mL infusion syringe 500 Units/hr (07/22/23 0800)   heparin 1,850 Units/hr (07/22/23 0900)   HYDROmorphone 8 mg/hr (07/22/23 0900)   meropenem (MERREM) IV Stopped (07/22/23 0548)   micafungin (MYCAMINE) 200 mg in sodium chloride 0.9 % 100 mL IVPB 110 mL/hr at 07/22/23 0900   midazolam Stopped (07/20/23 1813)   milrinone 0.125 mcg/kg/min (07/22/23 0900)   norepinephrine (LEVOPHED) Adult infusion 4 mcg/min (07/22/23 0900)   prismasol BGK 4/2.5 1,500 mL/hr at 07/22/23 0753   prismasol BGK 4/2.5 500 mL/hr at 07/22/23 0445   prismasol BGK 4/2.5 400 mL/hr at 07/21/23 2032   propofol (DIPRIVAN) infusion 55 mcg/kg/min (07/22/23 0900)   sodium bicarbonate 25 mEq (Impella PURGE) in dextrose 5 % 1000 mL bag 5.8 mL/hr at 07/13/23 0248   vancomycin Stopped (07/21/23 1256)   vasopressin 0.03 Units/min (07/22/23 0900)    Scheduled Medications:  acetaminophen  650 mg Per Tube Q6H   amiodarone  150 mg Intravenous Once   aspirin  81 mg Oral Daily   Chlorhexidine Gluconate Cloth  6 each Topical Daily  clonazePAM  1 mg Oral BID   docusate  100 mg Oral BID   feeding supplement (PROSource TF20)  60 mL Per Tube TID   fentaNYL (SUBLIMAZE) injection  50 mcg Intravenous Once   folic acid  1 mg Oral Daily   insulin aspart  0-20 Units Subcutaneous Q4H   insulin aspart  5 Units Subcutaneous Q4H   methimazole  10 mg Oral Q8H   mexiletine  250 mg Per Tube Q12H   multivitamin  1 tablet Oral QHS   mouth rinse  15 mL Mouth Rinse Q2H   oxyCODONE  5 mg Oral Q6H   pantoprazole (PROTONIX) IV  40 mg Intravenous QHS   polyethylene glycol  17 g Oral Daily   sodium chloride flush  3 mL Intravenous Q12H   thiamine  100 mg Oral Daily   Or   thiamine  100 mg Intravenous Daily    have reviewed scheduled and prn  medications.  Physical Exam: General: Critically ill looking male, on CRRT. Heart:RRR, s1s2 nl Lungs: Intubated, sedated. Abdomen:soft, non-distended Extremities:No edema Dialysis Access: Left subclavian temporary HD line.  Jordie Skalsky Prasad Arya Boxley 07/22/2023,9:31 AM  LOS: 13 days

## 2023-07-22 NOTE — Progress Notes (Signed)
 Patient ID: Aaron [name], male   DOB: Jul 30, 1985, 38 y.o.   MRN: [id number]     Advanced Heart Failure Rounding Note  Cardiologist: Christell Constant, MD  Chief Complaint: V-A ECMO  Subjective:    Admitted 2/26 with worsening shortness of breath, chest pain, atrial fibrillation with RVR. Decompensated 2/27 with IVF, nodal blockade with subsequent respiratory arrest, ROSC achieved after ~16 minute down time Femoral VA ecmo cannulation 2/27 with Impella CP vent  Patient is on milrinone 0.125, vasopressin 0.03, NE 18, NO down to 10 ppm.  Patient had apparent vagal episode last night with stimulation with bath and again with coughing, NE restarted.  CVVH ongoing, pulling net UF 100 cc/hr.    Amiodarone gtt 60 mg/hr + mexiletine, NSVT episodes x 2 yesterday but none overnight and remains in NSR.   Afebrile this morning but WBCs 26 => 30.  He is on vancomycin/meropenem/micafungin.   Echo (3/10): EF 35-40%, anterior and septal HK, normal RV, s/p Mitraclip with mild MR and mean gradient 7 mmHg.   Swan: RA 5 PA 52/15 CI 2.77 Co-ox 59%  Impella 5.5 P3 Flow 1.4 L/min Heparin gtt LDH 739 => 633   Objective:   Weight Range: 90.2 kg Body mass index is 24.21 kg/m.   Vital Signs:   Temp:  [96.8 F (36 C)-101.3 F (38.5 C)] 98.2 F (36.8 C) (03/11 0700) Pulse Rate:  [56-100] 56 (03/11 0700) Resp:  [18-33] 20 (03/11 0700) SpO2:  [92 %-100 %] 99 % (03/11 0700) Arterial Line BP: (66-157)/(37-76) 92/46 (03/11 0700) FiO2 (%):  [40 %-50 %] 40 % (03/11 0333) Weight:  [90.2 kg] 90.2 kg (03/11 0500) Last BM Date : 07/21/23  Weight change: Filed Weights   07/20/23 0500 07/21/23 0500 07/22/23 0500  Weight: 90.3 kg 90.8 kg 90.2 kg    Intake/Output:   Intake/Output Summary (Last 24 hours) at 07/22/2023 0723 Last data filed at 07/22/2023 0700 Gross per 24 hour  Intake 5397.91 ml  Output 5934.3 ml  Net -536.39 ml      Physical Exam    General: Sedated on vent Neck: No JVD,  no thyromegaly or thyroid nodule.  Lungs: Decreased at bases.  CV: Nondisplaced PMI.  Heart regular S1/S2, no S3/S4, no murmur.  No peripheral edema.  Abdomen: Soft, nontender, no hepatosplenomegaly, no distention.  Skin: Intact without lesions or rashes.  Neurologic: Sedated on vent Extremities: No clubbing or cyanosis.  HEENT: Normal.   Telemetry   NSR 80s (personally reviewed)  Labs    CBC Recent Labs    07/21/23 0418 07/21/23 1732 07/22/23 0412 07/22/23 0415  WBC 26.0*  --  30.4*  --   HGB 7.9*   < > 8.2* 8.8*  HCT 24.0*   < > 25.3* 26.0*  MCV 87.6  --  88.5  --   PLT 262  --  215  --    < > = values in this interval not displayed.   Basic Metabolic Panel Recent Labs    40/98/11 0418 07/21/23 1608 07/21/23 1732 07/22/23 0412 07/22/23 0415  NA 134* 131*  131*   < > 132*  132* 134*  K 3.8 4.1  4.2   < > 4.0  4.0 4.0  CL 99 99  99  --  99  100  --   CO2 22 21*  21*  --  22  21*  --   GLUCOSE 121* 118*  119*  --  133*  132*  --  BUN 58* 56*  56*  --  54*  55*  --   CREATININE 3.59* 3.40*  3.46*  --  3.29*  3.32*  --   CALCIUM 8.3* 7.9*  7.9*  --  8.5*  8.4*  --   MG 2.6*  --   --  2.6*  --   PHOS 3.0 2.9  --  1.8*  --    < > = values in this interval not displayed.   Liver Function Tests Recent Labs    07/21/23 0418 07/21/23 1608 07/22/23 0412  AST 36  --  37  ALT 15  --  16  ALKPHOS 119  --  135*  BILITOT 1.5*  --  1.3*  PROT 7.1  --  6.8  ALBUMIN 2.3*  2.3* 2.4* 2.1*  2.1*    BNP: BNP (last 3 results) Recent Labs    07/09/23 1934 07/10/23 1224  BNP 703.7* 980.5*   Thyroid Function Tests No results for input(s): "TSH", "T4TOTAL", "T3FREE", "THYROIDAB" in the last 72 hours.  Invalid input(s): "FREET3"    Medications:     Scheduled Medications:  acetaminophen  650 mg Per Tube Q6H   amiodarone  150 mg Intravenous Once   aspirin  81 mg Oral Daily   Chlorhexidine Gluconate Cloth  6 each Topical Daily   clonazePAM  1  mg Oral BID   docusate  100 mg Oral BID   feeding supplement (PROSource TF20)  60 mL Per Tube TID   fentaNYL (SUBLIMAZE) injection  50 mcg Intravenous Once   folic acid  1 mg Oral Daily   insulin aspart  0-20 Units Subcutaneous Q4H   insulin aspart  5 Units Subcutaneous Q4H   methimazole  10 mg Oral Q8H   mexiletine  250 mg Per Tube Q12H   multivitamin  1 tablet Oral QHS   mouth rinse  15 mL Mouth Rinse Q2H   oxyCODONE  5 mg Oral Q6H   pantoprazole (PROTONIX) IV  40 mg Intravenous QHS   polyethylene glycol  17 g Oral Daily   sodium chloride flush  3 mL Intravenous Q12H   thiamine  100 mg Oral Daily   Or   thiamine  100 mg Intravenous Daily    Infusions:  amiodarone 60 mg/hr (07/22/23 0700)   dexmedetomidine (PRECEDEX) IV infusion 0.4 mcg/kg/hr (07/22/23 0700)   feeding supplement (PIVOT 1.5 CAL) 65 mL/hr at 07/22/23 0700   heparin 10,000 units/ 20 mL infusion syringe 500 Units/hr (07/22/23 0048)   heparin 1,850 Units/hr (07/22/23 0700)   HYDROmorphone 8 mg/hr (07/22/23 0700)   meropenem (MERREM) IV Stopped (07/22/23 0548)   micafungin (MYCAMINE) 200 mg in sodium chloride 0.9 % 100 mL IVPB Stopped (07/21/23 1053)   midazolam Stopped (07/20/23 1813)   milrinone 0.125 mcg/kg/min (07/22/23 0700)   norepinephrine (LEVOPHED) Adult infusion 18 mcg/min (07/22/23 0700)   prismasol BGK 4/2.5 1,500 mL/hr at 07/22/23 0446   prismasol BGK 4/2.5 500 mL/hr at 07/22/23 0445   prismasol BGK 4/2.5 400 mL/hr at 07/21/23 2032   propofol (DIPRIVAN) infusion 55 mcg/kg/min (07/22/23 0700)   sodium bicarbonate 25 mEq (Impella PURGE) in dextrose 5 % 1000 mL bag 5.8 mL/hr at 07/13/23 0248   vancomycin Stopped (07/21/23 1256)   vasopressin 0.03 Units/min (07/22/23 0700)    PRN Medications: heparin, HYDROmorphone, midazolam, mouth rinse    Patient Profile   Patient with a history of Grave's disease, severe primary mitral regurgitation who presented with chest pain and segmental PE. Subsequent  progressed to SCAI Stage E shock requiring VA ECMO cannulation on 2/27.   Assessment/Plan   SCAI Stage E Cardiogenic shock - Suspect hypoxic respiratory failure driven with segmental PE on top of severe MR, nodal blockage, and thyrotoxicosis - Down time ~ 20 minutes, good mental status confirmed post code - We performed a mini-turn down on 07/15/23 at bedside; ECMO flows were reduced in a stepwise pattern by 0.5LPM to a goal of 2.5LPM. During this time, impella 5.5 flows increased to maintain net even flows. With reduction of ECMO flows, patient had onset of frequent PVCs/NSVT requiring amio boluses. In addition, despite increased LV unloading patient continued to have severe 4+ MR with progressive RV failure on bedside TTE.  - Underwent successful mTEER on 07/16/23 with placement of x3 clips and improvement in MR from severe to mild. Mean mitral gradient of 3-38mmHg.  - Decannulated from V-A ECMO by Dr. Karin Lieu on 07/19/23 with vascular cut down and primary repair of left femoral artery.  - Echo on 3/10 showed EF 35-40%, anterior and septal HK, normal RV, s/p Mitraclip with mild MR and mean gradient 7 mmHg.  - CVVH ongoing, net negative 100 cc/hr.  CVP 5 today.  I will cut back on CVVH, aim for event to net negative 50 cc/hr.   - He is on milrinone 0.125 + NE 18 + vasopressin 0.04.  Pressor doses back up again after apparent vagal episode this morning, will try to titrate down again today.  CI 2.77 with co-ox 59%, this is is adequate.   - PA 52/15 on NO 10 ppm and milrinone.  Continue slow NO wean.  - Impella 5.5 is on P3 this morning.  Should be able to remove later today, would like to see pressor doses back down again some, discussed with nurse.  Dr. Dorris Fetch to see today.    Severe mitral valve regurgitation - Noted on previous echocardiogram - Now s/p mTEER on 07/16/23; improvement in MR from severe to mild.  - No murmur on exam  Pulmonary hypertension - PA pressures reached 80s/30s with PCWP of  ~10, TPG of ~50mmHg suggestive of PVR >5 on 07/17/22 - PA 52/15; wean NO slowly today (currently 10 ppm). Overall observational data is not supportive with the use of sildenafil in fixed PH secondary to MS/MR.   VT/NSVT - Frequent runs of NSVT likely secondary to impella 5.5 position - Lidocaine gtt& amiodarone, now on mexiletine + amiodarone 60mg /hr - NSVT yesterday, continue current amiodarone today.   ID - Still on vancomycin/meropenem; possibly SIRS after decannulation.  - Bronchoscopy with thick purulent secretions - Started on micafungin on 07/19/23 - WBCs 22.8 => 26 => 30 but afebrile.   Anemia - Hgb 8.2 today, transfuse hgb < 7.5.   Acute renal failure/hyperkalemia - CVVH ongoing, continue net negative UF aim for even to -50 cc/hr today with CVP 5.   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   Atrial fibrillation - Present on arrival, in the setting of PE and thyroid disease - sinus rhythm today.  - On amiodarone.   Pulmonary embolism:  - Segmental without right heart strain - On heparin gtt.   CAD - Prior embolic infarct in the LAD territory with corresponding LGE on CMR - Lifelong anticoagulation - On heparin gtt here.   Sedation - Propofol/versed - Slow wean given agitation.  CRITICAL CARE Performed by: Marca Ancona  Total critical care time: 50 minutes  Critical care time was exclusive of separately billable procedures and treating other patients.  Critical care was necessary to treat or prevent imminent or life-threatening deterioration.  Critical care was time spent personally by me on the following activities: development of treatment plan with patient and/or surrogate as well as nursing, discussions with consultants, evaluation of patient's response to treatment, examination of patient, obtaining history from patient or  surrogate, ordering and performing treatments and interventions, ordering and review of laboratory studies, ordering and review of radiographic studies, pulse oximetry and re-evaluation of patient's condition.   Length of Stay: 26  Marca Ancona, MD  07/22/2023, 7:23 AM  Advanced Heart Failure Team Pager [phone] (M-F; 7a - 5p)  Please contact [facility] for night-coverage after hours (5p -7a ) and weekends on [email or web address]

## 2023-07-22 NOTE — Progress Notes (Addendum)
 Nutrition Follow-up  DOCUMENTATION CODES:   Severe malnutrition in context of acute illness/injury  INTERVENTION:   Discussed nutrition poc with Dr. Katrinka Blazing. Discussed NG placement today if extubated vs plan for Cortrak tomorrow. Current plan is tentatively Cortrak tomorrow  Tube Feeding via OG: Increase Pivot 1.5 to 75 ml/hr Pro-Source TF20 60 mL BID TF provides 2780 kcals, 209 g of protein and 1368 mL of free water  Supplement Phosphorus- recommend continuing to monitor closely while on CRRT  Add Banatrol TF BID as stool bulking agent, each packet contains 5g of soluble fiber  Recommend changing scheduled bowel regimen to prn  NUTRITION DIAGNOSIS:   Severe Malnutrition related to acute illness as evidenced by moderate fat depletion, moderate muscle depletion.  Being addressed  GOAL:   Patient will meet greater than or equal to 90% of their needs  Met  MONITOR:   TF tolerance, Vent status, Labs, Weight trends, Skin  REASON FOR ASSESSMENT:   Consult Enteral/tube feeding initiation and management (Trickle feeds)  ASSESSMENT:   38 y.o male presented with left-sided chest pain and shortness of breath, diagnosed with PE, coded and was intubated after 40 minutes of CPR. Subsequently ECMO started. PMH of PAF, chronic HFpEF, severe mitral regurgitation, hyperthyroidism. Noncompliance with medicines.  2/26 Admitted 2/27 V.tach/V.fib cardiac arrest, intubated, 40 minutes of CPR, tx to [facility], placed on [facility] 2/28 CRRT initiated via ECMO circuit, Citrate Protocol 3/01 Vital High Protein started at 20 ml/hr by MD 3/02 MD started titration of TF 3/03 Exchanged Impella CP for impella 5.5, Cortrak  3/05 OR: transcatheter edge to edge mitral valve repair 3/07 Decannulation from [facility] ECMO 3/10 ECHO with EF 35-40%  Pt remains on vent support, possible extubation today. iNO at 10ppm. Impella 5.5 remains in place Levophed and vasopressin off Sedated with propofol, dilaudid and  precedex gtt  Pt has been tolerating TF via OG, noted TF has been held on many occasions for long periods due to planned procedure/surgery limiting nutrition delivery  FMS with 1L stool output in 24 hours  Weight has been stable over the last several days, weight up overall. Question accuracy of bed weights given pt with less edema on repeat NFPE  No pressure injuries noted per RN skin assessment  Labs: Phosphorus 1.9 (L)-recommend supplementation-discussed with Pharmacist Sodium 134 (L) BUN 54, Creatinine 3.29 WBC 30.4  Meds:  ss novolog, 5 units novolog q 4 hours folic acid Thiamine Renal MVI  NUTRITION - FOCUSED PHYSICAL EXAM:  Flowsheet Row Most Recent Value  Orbital Region Moderate depletion  Upper Arm Region Mild depletion  Thoracic and Lumbar Region Moderate depletion  Buccal Region Unable to assess  Temple Region Moderate depletion  Clavicle Bone Region Mild depletion  Clavicle and Acromion Bone Region Mild depletion  Scapular Bone Region Moderate depletion  Dorsal Hand Unable to assess  Patellar Region Moderate depletion  Anterior Thigh Region Moderate depletion  Posterior Calf Region Unable to assess  Edema (RD Assessment) Mild  Hair Reviewed  Eyes Unable to assess  Mouth Unable to assess  Skin Reviewed  Nails Reviewed       Diet Order:   Diet Order     None       EDUCATION NEEDS:   Not appropriate for education at this time  Skin:  Skin Assessment: Skin Integrity Issues: Skin Integrity Issues:: Incisions, Wound VAC Wound Vac: R Groin incision with Prevena VAC Incisions: L axilla closed  Last BM:  1L stool output via FMS in 24 hours  Height:   Ht Readings from Last 1 Encounters:  07/09/23 6\' 4"  (1.93 m)    Weight:   Wt Readings from Last 1 Encounters:  07/22/23 90.2 kg    Ideal Body Weight:  91.8 kg  BMI:  Body mass index is 24.21 kg/m.  Estimated Nutritional Needs:   Kcal:  2500-2800 kcals  Protein:  190-210  gm  Fluid:  >2L/day   Romelle Starcher MS, RDN, LDN, CNSC Registered Dietitian 3 Clinical Nutrition RD Inpatient Contact Info in Amion

## 2023-07-22 NOTE — Progress Notes (Signed)
 eLink Physician-Brief Progress Note Patient Name: Aaron [name] DOB: [birth year] MRN: [id number]   Date of Service  07/22/2023  HPI/Events of Note  40%/ 5 PEEP/ 30 RR/ 610 A.m. ABG reviewed with alkalemia metabolic alkalosis  eICU Interventions  Reduce respiratory rate to 20     Intervention Category Intermediate Interventions: Respiratory distress - evaluation and management  Story [name] 07/22/2023, 5:31 AM

## 2023-07-22 NOTE — Progress Notes (Signed)
 Pharmacy Antibiotic Note  Aaron [name] is a 38 y.o. male admitted on 07/09/2023 with SOB ultimately required  ECMO cannulation 2/28 + Impella CP > 5.5 then mitraclip 3/5, decannulation 3/7. Continues on CRRT. Pharmacy has been consulted for vancomcyin, meropenem and micafungin dosing.  Currently on meropenem/vancomycin (on day #12), restarted on micafungin on 3/10 with WBC increasing and Tmax 101.3 on 3/10. WBC today continues to increase to 26. Scr 3.4 (remains on CRRT). No growth on cx outside of trach aspirate 2/28 showing rare staph aureus (sensitivities pan-sensitive MSSA).  Plan: Continue vancomycin at 1500 mg IV q24h - will order trough for 3/12  Meropenem 1gm IV q8h  Micafungin 200 mg IV every 24 hours Monitor cx results, clinical pic, CRRT tolerance,  and vanc levels as needed   Height: 6\' 4"  (193 cm) Weight: 90.2 kg (198 lb 13.7 oz) IBW/kg (Calculated) : 86.8  Temp (24hrs), Avg:98.4 F (36.9 C), Min:96.8 F (36 C), Max:99.5 F (37.5 C)  Recent Labs  Lab 07/16/23 0757 07/16/23 1341 07/17/23 0325 07/17/23 0636 07/18/23 0403 07/18/23 1228 07/18/23 1231 07/18/23 1531 07/18/23 2108 07/19/23 0347 07/19/23 0358 07/19/23 0403 07/19/23 1608 07/20/23 0501 07/20/23 1624 07/21/23 0418 07/21/23 1608 07/22/23 0412  WBC  --    < >  --    < >  --   --   --  26.1*  --  32.6*  --   --   --  21.8*  --  26.0*  --  30.4*  CREATININE  --    < >  --    < >  --    < >  --  3.85*  --   --    < >  --    < > 4.88* 4.05* 3.59* 3.40*  3.46* 3.29*  3.32*  LATICACIDVEN  --   --  0.9  --  0.8  --  0.8  --  0.6  --   --  0.7  --   --   --   --   --   --   VANCOTROUGH 9*  --   --   --   --   --   --   --   --   --   --   --   --   --   --   --   --   --    < > = values in this interval not displayed.    Estimated Creatinine Clearance: 37.4 mL/min (A) (by C-G formula based on SCr of 3.29 mg/dL (H)).    No Known Allergies  Thank you for allowing pharmacy to participate in this patient's  care,  Sherron Monday, PharmD, BCCCP Clinical Pharmacist  Phone: [phone] 07/22/2023 12:27 PM  Please check AMION for all [facility] Pharmacy phone numbers After 10:00 PM, call Main Pharmacy [phone]

## 2023-07-22 NOTE — Progress Notes (Signed)
 Nitric turned to 0 per MD. Chanda Busing off.

## 2023-07-22 NOTE — Progress Notes (Addendum)
 Weaning nitric Impella removal tomorrow as it is now thought the impella is inducing the ectopy.  Myrla Halsted MD PCCM

## 2023-07-23 ENCOUNTER — Other Ambulatory Visit: Payer: Self-pay

## 2023-07-23 ENCOUNTER — Inpatient Hospital Stay (HOSPITAL_COMMUNITY): Payer: Self-pay | Admitting: Certified Registered Nurse Anesthetist

## 2023-07-23 ENCOUNTER — Encounter (HOSPITAL_COMMUNITY)
Admission: EM | Disposition: A | Discharge status: Rehabilitation Facility | Payer: Self-pay | Source: Home / Self Care | Attending: Cardiology

## 2023-07-23 ENCOUNTER — Inpatient Hospital Stay (HOSPITAL_COMMUNITY)

## 2023-07-23 DIAGNOSIS — R079 Chest pain, unspecified: Secondary | ICD-10-CM | POA: Diagnosis not present

## 2023-07-23 DIAGNOSIS — N170 Acute kidney failure with tubular necrosis: Secondary | ICD-10-CM | POA: Diagnosis not present

## 2023-07-23 DIAGNOSIS — I480 Paroxysmal atrial fibrillation: Secondary | ICD-10-CM | POA: Diagnosis not present

## 2023-07-23 DIAGNOSIS — I2699 Other pulmonary embolism without acute cor pulmonale: Secondary | ICD-10-CM | POA: Diagnosis not present

## 2023-07-23 DIAGNOSIS — R509 Fever, unspecified: Secondary | ICD-10-CM | POA: Diagnosis not present

## 2023-07-23 DIAGNOSIS — E039 Hypothyroidism, unspecified: Secondary | ICD-10-CM

## 2023-07-23 DIAGNOSIS — I469 Cardiac arrest, cause unspecified: Secondary | ICD-10-CM | POA: Diagnosis not present

## 2023-07-23 DIAGNOSIS — I10 Essential (primary) hypertension: Secondary | ICD-10-CM

## 2023-07-23 DIAGNOSIS — R9389 Abnormal findings on diagnostic imaging of other specified body structures: Secondary | ICD-10-CM | POA: Diagnosis not present

## 2023-07-23 DIAGNOSIS — R0602 Shortness of breath: Secondary | ICD-10-CM | POA: Diagnosis not present

## 2023-07-23 DIAGNOSIS — R918 Other nonspecific abnormal finding of lung field: Secondary | ICD-10-CM | POA: Diagnosis not present

## 2023-07-23 DIAGNOSIS — J9601 Acute respiratory failure with hypoxia: Secondary | ICD-10-CM | POA: Diagnosis not present

## 2023-07-23 DIAGNOSIS — R570 Cardiogenic shock: Secondary | ICD-10-CM

## 2023-07-23 DIAGNOSIS — J9811 Atelectasis: Secondary | ICD-10-CM | POA: Diagnosis not present

## 2023-07-23 DIAGNOSIS — I4891 Unspecified atrial fibrillation: Secondary | ICD-10-CM | POA: Diagnosis not present

## 2023-07-23 DIAGNOSIS — E43 Unspecified severe protein-calorie malnutrition: Secondary | ICD-10-CM | POA: Insufficient documentation

## 2023-07-23 DIAGNOSIS — E872 Acidosis, unspecified: Secondary | ICD-10-CM | POA: Diagnosis not present

## 2023-07-23 DIAGNOSIS — E875 Hyperkalemia: Secondary | ICD-10-CM | POA: Diagnosis not present

## 2023-07-23 HISTORY — PX: REMOVAL OF IMPELLA LEFT VENTRICULAR ASSIST DEVICE: SHX6556

## 2023-07-23 HISTORY — PX: INTRAOPERATIVE TRANSESOPHAGEAL ECHOCARDIOGRAM: SHX5062

## 2023-07-23 LAB — POCT I-STAT 7, (LYTES, BLD GAS, ICA,H+H)
Acid-base deficit: 2 mmol/L (ref 0.0–2.0)
Acid-base deficit: 3 mmol/L — ABNORMAL HIGH (ref 0.0–2.0)
Acid-base deficit: 3 mmol/L — ABNORMAL HIGH (ref 0.0–2.0)
Acid-base deficit: 3 mmol/L — ABNORMAL HIGH (ref 0.0–2.0)
Bicarbonate: 24.1 mmol/L (ref 20.0–28.0)
Bicarbonate: 20.9 mmol/L (ref 20.0–28.0)
Bicarbonate: 20.6 mmol/L (ref 20.0–28.0)
Bicarbonate: 19.7 mmol/L — ABNORMAL LOW (ref 20.0–28.0)
Calcium, Ion: 1.11 mmol/L — ABNORMAL LOW (ref 1.15–1.40)
Calcium, Ion: 1.1 mmol/L — ABNORMAL LOW (ref 1.15–1.40)
Calcium, Ion: 1.1 mmol/L — ABNORMAL LOW (ref 1.15–1.40)
Calcium, Ion: 1.1 mmol/L — ABNORMAL LOW (ref 1.15–1.40)
HCT: 30 % — ABNORMAL LOW (ref 39.0–52.0)
HCT: 34 % — ABNORMAL LOW (ref 39.0–52.0)
HCT: 29 % — ABNORMAL LOW (ref 39.0–52.0)
HCT: 29 % — ABNORMAL LOW (ref 39.0–52.0)
Hemoglobin: 10.2 g/dL — ABNORMAL LOW (ref 13.0–17.0)
Hemoglobin: 11.6 g/dL — ABNORMAL LOW (ref 13.0–17.0)
Hemoglobin: 9.9 g/dL — ABNORMAL LOW (ref 13.0–17.0)
Hemoglobin: 9.9 g/dL — ABNORMAL LOW (ref 13.0–17.0)
O2 Saturation: 94 %
O2 Saturation: 86 %
O2 Saturation: 86 %
O2 Saturation: 87 %
Patient temperature: 37.6
Patient temperature: 37.3
Patient temperature: 36.5
Potassium: 5.2 mmol/L — ABNORMAL HIGH (ref 3.5–5.1)
Potassium: 5.4 mmol/L — ABNORMAL HIGH (ref 3.5–5.1)
Potassium: 5.2 mmol/L — ABNORMAL HIGH (ref 3.5–5.1)
Potassium: 5.2 mmol/L — ABNORMAL HIGH (ref 3.5–5.1)
Sodium: 133 mmol/L — ABNORMAL LOW (ref 135–145)
Sodium: 132 mmol/L — ABNORMAL LOW (ref 135–145)
Sodium: 130 mmol/L — ABNORMAL LOW (ref 135–145)
Sodium: 131 mmol/L — ABNORMAL LOW (ref 135–145)
TCO2: 25 mmol/L (ref 22–32)
TCO2: 22 mmol/L (ref 22–32)
TCO2: 21 mmol/L — ABNORMAL LOW (ref 22–32)
TCO2: 20 mmol/L — ABNORMAL LOW (ref 22–32)
pCO2 arterial: 45.1 mmHg (ref 32–48)
pCO2 arterial: 32.6 mmHg (ref 32–48)
pCO2 arterial: 28.6 mmHg — ABNORMAL LOW (ref 32–48)
pCO2 arterial: 27.4 mmHg — ABNORMAL LOW (ref 32–48)
pH, Arterial: 7.339 — ABNORMAL LOW (ref 7.35–7.45)
pH, Arterial: 7.416 (ref 7.35–7.45)
pH, Arterial: 7.463 — ABNORMAL HIGH (ref 7.35–7.45)
pH, Arterial: 7.465 — ABNORMAL HIGH (ref 7.35–7.45)
pO2, Arterial: 76 mmHg — ABNORMAL LOW (ref 83–108)
pO2, Arterial: 51 mmHg — ABNORMAL LOW (ref 83–108)
pO2, Arterial: 45 mmHg — ABNORMAL LOW (ref 83–108)
pO2, Arterial: 49 mmHg — ABNORMAL LOW (ref 83–108)

## 2023-07-23 LAB — HEPATIC FUNCTION PANEL
ALT: 24 U/L (ref 0–44)
AST: 59 U/L — ABNORMAL HIGH (ref 15–41)
Albumin: 2.4 g/dL — ABNORMAL LOW (ref 3.5–5.0)
Alkaline Phosphatase: 174 U/L — ABNORMAL HIGH (ref 38–126)
Bilirubin, Direct: 0.5 mg/dL — ABNORMAL HIGH (ref 0.0–0.2)
Indirect Bilirubin: 0.9 mg/dL (ref 0.3–0.9)
Total Bilirubin: 1.4 mg/dL — ABNORMAL HIGH (ref 0.0–1.2)
Total Protein: 8 g/dL (ref 6.5–8.1)

## 2023-07-23 LAB — RENAL FUNCTION PANEL
Albumin: 2.4 g/dL — ABNORMAL LOW (ref 3.5–5.0)
Albumin: 2.4 g/dL — ABNORMAL LOW (ref 3.5–5.0)
Anion gap: 14 (ref 5–15)
Anion gap: 12 (ref 5–15)
BUN: 54 mg/dL — ABNORMAL HIGH (ref 6–20)
BUN: 56 mg/dL — ABNORMAL HIGH (ref 6–20)
CO2: 21 mmol/L — ABNORMAL LOW (ref 22–32)
CO2: 22 mmol/L (ref 22–32)
Calcium: 9 mg/dL (ref 8.9–10.3)
Calcium: 8.6 mg/dL — ABNORMAL LOW (ref 8.9–10.3)
Chloride: 97 mmol/L — ABNORMAL LOW (ref 98–111)
Chloride: 96 mmol/L — ABNORMAL LOW (ref 98–111)
Creatinine, Ser: 2.99 mg/dL — ABNORMAL HIGH (ref 0.61–1.24)
Creatinine, Ser: 3.35 mg/dL — ABNORMAL HIGH (ref 0.61–1.24)
GFR, Estimated: 27 mL/min — ABNORMAL LOW (ref >60)
GFR, Estimated: 23 mL/min — ABNORMAL LOW (ref >60)
Glucose, Bld: 116 mg/dL — ABNORMAL HIGH (ref 70–99)
Glucose, Bld: 145 mg/dL — ABNORMAL HIGH (ref 70–99)
Phosphorus: 3.9 mg/dL (ref 2.5–4.6)
Phosphorus: 3.9 mg/dL (ref 2.5–4.6)
Potassium: 5.2 mmol/L — ABNORMAL HIGH (ref 3.5–5.1)
Potassium: 5.2 mmol/L — ABNORMAL HIGH (ref 3.5–5.1)
Sodium: 132 mmol/L — ABNORMAL LOW (ref 135–145)
Sodium: 130 mmol/L — ABNORMAL LOW (ref 135–145)

## 2023-07-23 LAB — BASIC METABOLIC PANEL
Anion gap: 14 (ref 5–15)
BUN: 54 mg/dL — ABNORMAL HIGH (ref 6–20)
CO2: 21 mmol/L — ABNORMAL LOW (ref 22–32)
Calcium: 8.7 mg/dL — ABNORMAL LOW (ref 8.9–10.3)
Chloride: 98 mmol/L (ref 98–111)
Creatinine, Ser: 3.15 mg/dL — ABNORMAL HIGH (ref 0.61–1.24)
GFR, Estimated: 25 mL/min — ABNORMAL LOW (ref >60)
Glucose, Bld: 117 mg/dL — ABNORMAL HIGH (ref 70–99)
Potassium: 5.3 mmol/L — ABNORMAL HIGH (ref 3.5–5.1)
Sodium: 133 mmol/L — ABNORMAL LOW (ref 135–145)

## 2023-07-23 LAB — GLUCOSE, CAPILLARY
Glucose-Capillary: 113 mg/dL — ABNORMAL HIGH (ref 70–99)
Glucose-Capillary: 117 mg/dL — ABNORMAL HIGH (ref 70–99)
Glucose-Capillary: 141 mg/dL — ABNORMAL HIGH (ref 70–99)
Glucose-Capillary: 164 mg/dL — ABNORMAL HIGH (ref 70–99)
Glucose-Capillary: 164 mg/dL — ABNORMAL HIGH (ref 70–99)
Glucose-Capillary: 165 mg/dL — ABNORMAL HIGH (ref 70–99)

## 2023-07-23 LAB — CBC
HCT: 27.9 % — ABNORMAL LOW (ref 39.0–52.0)
Hemoglobin: 9 g/dL — ABNORMAL LOW (ref 13.0–17.0)
MCH: 29.2 pg (ref 26.0–34.0)
MCHC: 32.3 g/dL (ref 30.0–36.0)
MCV: 90.6 fL (ref 80.0–100.0)
Platelets: 229 10*3/uL (ref 150–400)
RBC: 3.08 MIL/uL — ABNORMAL LOW (ref 4.22–5.81)
RDW: 21.1 % — ABNORMAL HIGH (ref 11.5–15.5)
WBC: 41.8 10*3/uL — ABNORMAL HIGH (ref 4.0–10.5)
nRBC: 0.3 % — ABNORMAL HIGH (ref 0.0–0.2)

## 2023-07-23 LAB — LACTATE DEHYDROGENASE: LDH: 715 U/L — ABNORMAL HIGH (ref 98–192)

## 2023-07-23 LAB — ECHO INTRAOPERATIVE TEE
Height: 76 in
Weight: 3082.91 [oz_av]

## 2023-07-23 LAB — COOXEMETRY PANEL
Carboxyhemoglobin: 0.9 % (ref 0.5–1.5)
Methemoglobin: <0.7 % (ref 0.0–1.5)
O2 Saturation: 63.6 %
Total hemoglobin: 9.3 g/dL — ABNORMAL LOW (ref 12.0–16.0)

## 2023-07-23 LAB — SURGICAL PCR SCREEN
MRSA, PCR: NEGATIVE
Staphylococcus aureus: NEGATIVE

## 2023-07-23 LAB — FIBRINOGEN: Fibrinogen: >800 mg/dL — ABNORMAL HIGH (ref 210–475)

## 2023-07-23 LAB — MAGNESIUM: Magnesium: 2.6 mg/dL — ABNORMAL HIGH (ref 1.7–2.4)

## 2023-07-23 LAB — VANCOMYCIN, TROUGH: Vancomycin Tr: 25 ug/mL (ref 15–20)

## 2023-07-23 LAB — TRIGLYCERIDES: Triglycerides: 119 mg/dL (ref <150)

## 2023-07-23 LAB — APTT: aPTT: 143 s — ABNORMAL HIGH (ref 24–36)

## 2023-07-23 SURGERY — REMOVAL, CARDIAC ASSIST DEVICE, IMPELLA
Anesthesia: General | Site: Chest

## 2023-07-23 MED ORDER — ETOMIDATE 2 MG/ML IV SOLN
INTRAVENOUS | Status: AC
  Administered 2023-07-24: 20 mg
  Filled 2023-07-23: qty 20

## 2023-07-23 MED ORDER — VANCOMYCIN HCL 1.25 G IV SOLR
1250.0000 mg | INTRAVENOUS | Status: DC
  Administered 2023-07-23 – 2023-07-24 (×2): 1250 mg via INTRAVENOUS
  Filled 2023-07-23 (×3): qty 25

## 2023-07-23 MED ORDER — MIDAZOLAM HCL 2 MG/2ML IJ SOLN
INTRAMUSCULAR | Status: AC
  Administered 2023-07-24: 4 mg
  Filled 2023-07-23: qty 2

## 2023-07-23 MED ORDER — 0.9 % SODIUM CHLORIDE (POUR BTL) OPTIME
TOPICAL | Status: DC | PRN
  Administered 2023-07-23: 2000 mL

## 2023-07-23 MED ORDER — PRISMASOL BGK 2/3.5 32-2-3.5 MEQ/L EC SOLN: Status: DC

## 2023-07-23 MED ORDER — KETAMINE HCL 50 MG/5ML IJ SOSY
PREFILLED_SYRINGE | INTRAMUSCULAR | Status: AC
  Filled 2023-07-23: qty 10

## 2023-07-23 MED ORDER — FENTANYL CITRATE (PF) 250 MCG/5ML IJ SOLN
INTRAMUSCULAR | Status: DC | PRN
  Administered 2023-07-23: 100 ug via INTRAVENOUS
  Administered 2023-07-23: 50 ug via INTRAVENOUS
  Administered 2023-07-23: 100 ug via INTRAVENOUS

## 2023-07-23 MED ORDER — PROPOFOL 1000 MG/100ML IV EMUL
INTRAVENOUS | Status: AC
  Filled 2023-07-23: qty 100

## 2023-07-23 MED ORDER — SUCCINYLCHOLINE CHLORIDE 200 MG/10ML IV SOSY
PREFILLED_SYRINGE | INTRAVENOUS | Status: AC
  Filled 2023-07-23: qty 10

## 2023-07-23 MED ORDER — ROCURONIUM BROMIDE 10 MG/ML (PF) SYRINGE
PREFILLED_SYRINGE | INTRAVENOUS | Status: AC
  Administered 2023-07-24: 50 mg via INTRAVENOUS
  Filled 2023-07-23: qty 10

## 2023-07-23 MED ORDER — CLEVIDIPINE BUTYRATE 0.5 MG/ML IV EMUL
0.0000 mg/h | INTRAVENOUS | Status: DC
  Administered 2023-07-23 – 2023-07-24 (×3): 2 mg/h via INTRAVENOUS
  Filled 2023-07-23 (×3): qty 100

## 2023-07-23 MED ORDER — LACTATED RINGERS IV SOLN: INTRAVENOUS | Status: DC | PRN

## 2023-07-23 MED ORDER — PROPOFOL 10 MG/ML IV BOLUS
INTRAVENOUS | Status: AC
  Filled 2023-07-23: qty 20

## 2023-07-23 MED ORDER — FENTANYL CITRATE (PF) 250 MCG/5ML IJ SOLN
INTRAMUSCULAR | Status: AC
  Filled 2023-07-23: qty 5

## 2023-07-23 MED ORDER — FENTANYL CITRATE PF 50 MCG/ML IJ SOSY
PREFILLED_SYRINGE | INTRAMUSCULAR | Status: DC
Start: 2023-07-23 — End: 2023-07-24
  Filled 2023-07-23: qty 2

## 2023-07-23 MED ORDER — MIDAZOLAM HCL 2 MG/2ML IJ SOLN
INTRAMUSCULAR | Status: AC
Start: 2023-07-23 — End: 2023-07-24
  Filled 2023-07-23: qty 2

## 2023-07-23 MED ORDER — ROCURONIUM BROMIDE 10 MG/ML (PF) SYRINGE
PREFILLED_SYRINGE | INTRAVENOUS | Status: AC
  Filled 2023-07-23: qty 10

## 2023-07-23 MED ORDER — ROCURONIUM BROMIDE 10 MG/ML (PF) SYRINGE
PREFILLED_SYRINGE | INTRAVENOUS | Status: DC | PRN
Start: 2023-07-23 — End: 2023-07-23
  Administered 2023-07-23: 70 mg via INTRAVENOUS

## 2023-07-23 MED ORDER — VANCOMYCIN HCL 1250 MG/250ML IV SOLN
1250.0000 mg | INTRAVENOUS | Status: DC
  Filled 2023-07-23: qty 250

## 2023-07-23 SURGICAL SUPPLY — 57 items
ANCHOR CATH FOLEY SECURE (MISCELLANEOUS) ×6 IMPLANT
APPLICATOR TIP COSEAL (VASCULAR PRODUCTS) IMPLANT
BLADE CLIPPER SURG (BLADE) ×2 IMPLANT
BLADE SURG 11 STRL SS (BLADE) IMPLANT
CATH DIAG EXPO 6F AL1 (CATHETERS) ×2 IMPLANT
CATH INFINITI 6F MPB2 (CATHETERS) ×2 IMPLANT
CLEANER TIP ELECTROSURG 2X2 (MISCELLANEOUS) IMPLANT
CLIP LIGATING EXTRA MED SLVR (CLIP) ×2 IMPLANT
CLIP TI MEDIUM 24 (CLIP) IMPLANT
CLIP TI WIDE RED SMALL 24 (CLIP) IMPLANT
CONTAINER PROTECT SURGISLUSH (MISCELLANEOUS) ×2 IMPLANT
DRAPE C-ARM 42X72 X-RAY (DRAPES) ×4 IMPLANT
DRAPE CV SPLIT W-CLR ANES SCRN (DRAPES) ×2 IMPLANT
DRAPE PERI GROIN 82X75IN TIB (DRAPES) ×2 IMPLANT
DRAPE WARM FLUID 44X44 (DRAPES) ×2 IMPLANT
DRSG COVADERM 4X6 (GAUZE/BANDAGES/DRESSINGS) IMPLANT
DRSG COVADERM 4X8 (GAUZE/BANDAGES/DRESSINGS) IMPLANT
FELT TEFLON 1X6 (MISCELLANEOUS) ×2 IMPLANT
GAUZE 4X4 16PLY ~~LOC~~+RFID DBL (SPONGE) ×2 IMPLANT
GAUZE SPONGE 4X4 12PLY STRL (GAUZE/BANDAGES/DRESSINGS) IMPLANT
GLOVE NEODERM STRL 7.5 LF PF (GLOVE) ×6 IMPLANT
GLOVE SS BIOGEL STRL SZ 7.5 (GLOVE) ×2 IMPLANT
GLOVE SURG MICRO LTX SZ7.5 (GLOVE) ×6 IMPLANT
GOWN STRL REUS W/ TWL LRG LVL3 (GOWN DISPOSABLE) ×8 IMPLANT
GOWN STRL REUS W/ TWL XL LVL3 (GOWN DISPOSABLE) ×2 IMPLANT
INSERT FOGARTY SM (MISCELLANEOUS) ×2 IMPLANT
KIT BASIN OR (CUSTOM PROCEDURE TRAY) ×2 IMPLANT
LIGACLIP SM TITANIUM (CLIP) ×2 IMPLANT
NS IRRIG 1000ML POUR BTL (IV SOLUTION) ×8 IMPLANT
PACK CHEST (CUSTOM PROCEDURE TRAY) ×2 IMPLANT
PAD ARMBOARD 7.5X6 YLW CONV (MISCELLANEOUS) ×4 IMPLANT
PAD ELECT DEFIB RADIOL ZOLL (MISCELLANEOUS) ×2 IMPLANT
PUMP SET IMPELLA 5.5 US (CATHETERS) ×2 IMPLANT
RELOAD STAPLE 45 4.1 GRN THCK (STAPLE) IMPLANT
SEALANT SURG COSEAL 8ML (VASCULAR PRODUCTS) ×2 IMPLANT
SPONGE T-LAP 18X18 ~~LOC~~+RFID (SPONGE) ×8 IMPLANT
SPONGE T-LAP 4X18 ~~LOC~~+RFID (SPONGE) ×2 IMPLANT
STAPLE RELOAD 45 GRN (STAPLE) ×2 IMPLANT
STAPLER ENDO GIA 12 SHRT THIN (STAPLE) IMPLANT
STAPLER ENDO GIA 12MM SHORT (STAPLE) ×2 IMPLANT
STAPLER SKIN PROX WIDE 3.9 (STAPLE) IMPLANT
STAPLER VISISTAT 35W (STAPLE) IMPLANT
SUT ETHILON 3 0 PS 1 (SUTURE) ×4 IMPLANT
SUT PDS AB 1 CTX 36 (SUTURE) ×2 IMPLANT
SUT PROLENE 4-0 RB1 .5 CRCL 36 (SUTURE) IMPLANT
SUT PROLENE 5 0 C 1 36 (SUTURE) ×4 IMPLANT
SUT SILK 1 MH (SUTURE) ×8 IMPLANT
SUT SILK 1 TIES 10X30 (SUTURE) ×2 IMPLANT
SUT SILK 2 0 SH CR/8 (SUTURE) IMPLANT
SUT SILK 2 0 TIES 10X30 (SUTURE) IMPLANT
SUT VIC AB 2-0 CT1 TAPERPNT 27 (SUTURE) IMPLANT
SUT VIC AB 3-0 SH 27X BRD (SUTURE) IMPLANT
TOWEL GREEN STERILE (TOWEL DISPOSABLE) ×2 IMPLANT
TOWEL GREEN STERILE FF (TOWEL DISPOSABLE) ×2 IMPLANT
TRAY FOLEY SLVR 16FR TEMP STAT (SET/KITS/TRAYS/PACK) ×2 IMPLANT
VASCULAR TIE MINI RED 18IN STL (MISCELLANEOUS) ×2 IMPLANT
WATER STERILE IRR 1000ML POUR (IV SOLUTION) ×4 IMPLANT

## 2023-07-23 NOTE — Progress Notes (Addendum)
 [facility] NEPHROLOGY PROGRESS NOTE  Assessment/ Plan: Pt is a 38 y.o. yo male  likely ATN after SCA, cardiogenic shock on ECMO and Impella, AHRF/VDRF   # Dialysis dependent anuric AKI 2/2 ATN from cardiogenic shock on CRRT since 07/11/23, Left subclavian temp HD cath placed by CCM;  UF around 1.8 L in 24 hours.  Potassium level upper limit of normal therefore changing pre and post filter to 2K bath.  Continue dialysis at [facility]  UF around 50-100 cc an hour.     # Cardiogenic Shock on  Impella; status post transcutaneous mitral valve repair; previously on ECMO. per cardiology.  # VT + PEA SCA, as per cardiology team. # PE on heparin per CCM # Anemia, Hb stable, per AHF # Hyperkalemia, less present after ECMO decannulation # Severe MR s/p TEER 3/5 # Hypophosphatemia due to CRRT: Replating IV phosphorus and repeating lab.  Subjective: Seen and examined and ICU.  Plan to remove Impella today.  Tolerating CRRT well.  No change in CRRT prescription.  Discussed with ICU team.  No new event. Objective Vital signs in last 24 hours: Vitals:   07/23/23 0800 07/23/23 0815 07/23/23 0830 07/23/23 0845  BP:      Pulse: 73  84   Resp: 18 15 13 17   Temp: 99.5 F (37.5 C) 99.5 F (37.5 C) 100 F (37.8 C) 100.2 F (37.9 C)  TempSrc: Core     SpO2: 95%  96%   Weight:      Height:       Weight change: -2.8 kg  Intake/Output Summary (Last 24 hours) at 07/23/2023 1028 Last data filed at 07/23/2023 1026 Gross per 24 hour  Intake 5151.32 ml  Output 6063 ml  Net -911.68 ml       Labs: RENAL PANEL Recent Labs  Lab 07/19/23 0358 07/19/23 0402 07/20/23 0501 07/20/23 1624 07/21/23 0418 07/21/23 1608 07/21/23 1732 07/22/23 0412 07/22/23 0415 07/22/23 1511 07/22/23 1636 07/22/23 1649 07/23/23 0414 07/23/23 0415  NA 136   < > 134*   < > 134* 131*  131*   < > 132*  132*   < > 128* 129* 131* 133*  132* 133*  K 4.6   < > 3.6   < > 3.8 4.1  4.2   < > 4.0  4.0   < > 4.7  5.1 5.1 5.3*  5.2* 5.2*  CL 101   < > 100   < > 99 99  99  --  99  100  --  98 96*  --  98  97*  --   CO2 23   < > 21*   < > 22 21*  21*  --  22  21*  --  20* 20*  --  21*  21*  --   GLUCOSE 146*   < > 135*   < > 121* 118*  119*  --  133*  132*  --  160* 166*  --  117*  116*  --   BUN 93*   < > 81*   < > 58* 56*  56*  --  54*  55*  --  53* 53*  --  54*  54*  --   CREATININE 5.67*   < > 4.88*   < > 3.59* 3.40*  3.46*  --  3.29*  3.32*  --  3.05* 3.13*  --  3.15*  2.99*  --   CALCIUM 8.1*   < > 7.9*   < >  8.3* 7.9*  7.9*  --  8.5*  8.4*  --  8.2* 8.5*  --  8.7*  9.0  --   MG 2.6*  --  2.3  --  2.6*  --   --  2.6*  --   --   --   --  2.6*  --   PHOS 3.4   < > 3.6   < > 3.0 2.9  --  1.8*  --  2.7  --   --  3.9  --   ALBUMIN 2.8*  2.8*   < > 2.3*   < > 2.3*  2.3* 2.4*  --  2.1*  2.1*  --  2.4*  --   --  2.4*  2.4*  --    < > = values in this interval not displayed.    Liver Function Tests: Recent Labs  Lab 07/21/23 0418 07/21/23 1608 07/22/23 0412 07/22/23 1511 07/23/23 0414  AST 36  --  37  --  59*  ALT 15  --  16  --  24  ALKPHOS 119  --  135*  --  174*  BILITOT 1.5*  --  1.3*  --  1.4*  PROT 7.1  --  6.8  --  8.0  ALBUMIN 2.3*  2.3*   < > 2.1*  2.1* 2.4* 2.4*  2.4*   < > = values in this interval not displayed.   No results for input(s): "LIPASE", "AMYLASE" in the last 168 hours. No results for input(s): "AMMONIA" in the last 168 hours. CBC: Recent Labs    07/22/23 0412 07/22/23 0415 07/22/23 1649 07/23/23 0414 07/23/23 0415  HGB 8.2* 8.8* 10.2* 9.0* 10.2*  MCV 88.5  --   --  90.6  --     Cardiac Enzymes: No results for input(s): "CKTOTAL", "CKMB", "CKMBINDEX", "TROPONINI" in the last 168 hours. CBG: Recent Labs  Lab 07/22/23 1516 07/22/23 2018 07/22/23 2308 07/23/23 0413 07/23/23 0739  GLUCAP 160* 127* 143* 117* 113*    Iron Studies: No results for input(s): "IRON", "TIBC", "TRANSFERRIN", "FERRITIN" in the last 72  hours. Studies/Results: DG Chest Port 1 View Result Date: 07/22/2023 CLINICAL DATA:  Follow-up left basilar atelectasis EXAM: PORTABLE CHEST 1 VIEW COMPARISON:  07/20/2023 FINDINGS: Endotracheal tube and gastric catheter are noted in satisfactory position. Swan-Ganz catheter is noted in the pulmonary outflow tract. Impella catheter is noted from the right arm. Left jugular central line is noted at the cavoatrial junction. Temporary dialysis catheter is noted on the left with the tip in the left innominate vein. The lungs are well aerated bilaterally. Persistent left retrocardiac density is noted. Mitra clips are seen. IMPRESSION: Tubes and lines as described above. Persistent left basilar consolidation. Electronically Signed   By: Alcide Clever M.D.   On: 07/22/2023 10:39   ECHOCARDIOGRAM LIMITED Result Date: 07/21/2023    ECHOCARDIOGRAM LIMITED REPORT   Patient Name:   KAYCEN WHITWORTH Date of Exam: 07/21/2023 Medical Rec #:  [id number]    Height:       76.0 in Accession #:    [id number]   Weight:       200.2 lb Date of Birth:  1985/06/01    BSA:          2.216 m Patient Age:    37 years     BP:           0/0 mmHg Patient Gender: M            HR:  92 bpm. Exam Location:  Inpatient Procedure: Limited Echo, Limited Color Doppler and Cardiac Doppler (Both            Spectral and Color Flow Doppler were utilized during procedure). Indications:    CHF  History:        Patient has no prior history of Echocardiogram examinations and                 Patient has prior history of Echocardiogram examinations, most                 recent 07/19/2023. Mitral Valve Prolapse, Arrythmias:Cardiac                 Arrest and Atrial Fibrillation; Risk Factors:Hypertension.  Sonographer:    Amy Chionchio Referring Phys: 60 DALTON S MCLEAN IMPRESSIONS  1. Impella 5.5 in LV, inflow is about 5.5 cm from the aortic valve. Position looks acceptable. Left ventricular ejection fraction, by estimation, is 35 to 40%. The left ventricle  has moderately decreased function. The left ventricle demonstrates regional wall motion abnormalities with anterior and septal hypokinesis. There is mild concentric left ventricular hypertrophy.  2. Right ventricular systolic function is normal. The right ventricular size is normal.  3. S/p Mitraclip with mild residual mitral regurgitation. Mean gradient 7 mmHg.  4. Limited echo for Impella. FINDINGS  Left Ventricle: Impella 5.5 in LV, inflow is about 5.5 cm from the aortic valve. Position looks acceptable. Left ventricular ejection fraction, by estimation, is 35 to 40%. The left ventricle has moderately decreased function. The left ventricle demonstrates regional wall motion abnormalities. The left ventricular internal cavity size was normal in size. There is mild concentric left ventricular hypertrophy. Right Ventricle: The right ventricular size is normal. Right ventricular systolic function is normal. Left Atrium: Left atrial size was normal in size. Right Atrium: Right atrial size was normal in size. Mitral Valve: S/p Mitraclip with mild residual mitral regurgitation. Mean gradient 7 mmHg. There is a Mitra-Clip present in the mitral position. MV peak gradient, 11.8 mmHg. The mean mitral valve gradient is 6.5 mmHg. Aorta: The aortic root and ascending aorta are structurally normal, with no evidence of dilitation. LEFT VENTRICLE PLAX 2D LVIDd:         4.60 cm LVIDs:         4.10 cm LV PW:         1.20 cm LV IVS:        1.20 cm  RIGHT VENTRICLE RV Basal diam:  3.70 cm RV Mid diam:    3.30 cm RV S prime:     13.40 cm/s TAPSE (M-mode): 1.9 cm RIGHT ATRIUM           Index RA Area:     16.70 cm RA Volume:   50.10 ml  22.61 ml/m   AORTA Ao Asc diam: 3.20 cm MITRAL VALVE MV Peak grad: 11.8 mmHg MV Mean grad: 6.5 mmHg MV Vmax:      1.72 m/s MV Vmean:     122.0 cm/s Dalton McleanMD Electronically signed by Wilfred Lacy Signature Date/Time: 07/21/2023/2:57:35 PM    Final     Medications: Infusions:  amiodarone 60  mg/hr (07/23/23 0850)   dexmedetomidine (PRECEDEX) IV infusion 0.5 mcg/kg/hr (07/23/23 0800)   feeding supplement (PIVOT 1.5 CAL) Stopped (07/23/23 0001)   heparin 10,000 units/ 20 mL infusion syringe 500 Units/hr (07/22/23 1855)   HYDROmorphone 4 mg/hr (07/23/23 0850)   meropenem (MERREM) IV Stopped (07/23/23 0544)   micafungin (MYCAMINE)  200 mg in sodium chloride 0.9 % 100 mL IVPB Stopped (07/22/23 0900)   midazolam Stopped (07/20/23 1813)   milrinone 0.125 mcg/kg/min (07/23/23 0850)   norepinephrine (LEVOPHED) Adult infusion Stopped (07/22/23 0907)   PrismaSol BGK 2/3.5     PrismaSol BGK 2/3.5     prismasol BGK 4/2.5 1,500 mL/hr at 07/23/23 0723   propofol (DIPRIVAN) infusion 55 mcg/kg/min (07/23/23 1000)   sodium bicarbonate 25 mEq (Impella PURGE) in dextrose 5 % 1000 mL bag Stopped (07/23/23 0955)   vancomycin Stopped (07/22/23 1212)   vasopressin Stopped (07/22/23 1246)    Scheduled Medications:  acetaminophen  650 mg Per Tube Q6H   amiodarone  150 mg Intravenous Once   aspirin  81 mg Oral Daily   Chlorhexidine Gluconate Cloth  6 each Topical Daily   clonazePAM  1 mg Oral BID   feeding supplement (PROSource TF20)  60 mL Per Tube BID   fentaNYL (SUBLIMAZE) injection  50 mcg Intravenous Once   fiber supplement (BANATROL TF)  60 mL Per Tube BID   folic acid  1 mg Oral Daily   insulin aspart  0-20 Units Subcutaneous Q4H   insulin aspart  5 Units Subcutaneous Q4H   methimazole  10 mg Oral Q8H   mexiletine  250 mg Per Tube Q12H   multivitamin  1 tablet Oral QHS   mupirocin ointment  1 Application Nasal BID   mouth rinse  15 mL Mouth Rinse Q2H   oxyCODONE  5 mg Oral Q6H   pantoprazole (PROTONIX) IV  40 mg Intravenous QHS   sodium chloride flush  3 mL Intravenous Q12H   thiamine  100 mg Oral Daily   Or   thiamine  100 mg Intravenous Daily    have reviewed scheduled and prn medications.  Physical Exam: General: Critically ill looking male, on CRRT. Heart:RRR, s1s2  nl Lungs: Intubated, sedated. Abdomen:soft, non-distended Extremities:No edema Dialysis Access: Left subclavian temporary HD line.  Aamari Strawderman Prasad Olney Monier 07/23/2023,10:28 AM  LOS: 14 days

## 2023-07-23 NOTE — Progress Notes (Signed)
 PHARMACY - ANTICOAGULATION CONSULT NOTE  Pharmacy Consult for Heparin  Indication: pulmonary embolus, Impella, s/p ECMO decannulation   No Known Allergies  Patient Measurements: Height: 6\' 4"  (193 cm) Weight: 87.4 kg (192 lb 10.9 oz) IBW/kg (Calculated) : 86.8 Heparin Dosing Weight: n/a  Vital Signs: Temp: 100.2 F (37.9 C) (03/12 0845) Temp Source: Core (03/12 0800) Pulse Rate: 95 (03/12 1245)  Labs: Recent Labs    07/20/23 1432 07/20/23 1624 07/21/23 0418 07/21/23 1328 07/21/23 2249 07/22/23 0412 07/22/23 0415 07/22/23 1511 07/22/23 1636 07/22/23 1649 07/22/23 2308 07/23/23 0414 07/23/23 0415  HGB  --    < > 7.9*   < >  --  8.2*   < >  --   --  10.2*  --  9.0* 10.2*  HCT  --    < > 24.0*   < >  --  25.3*   < >  --   --  30.0*  --  27.9* 30.0*  PLT  --   --  262  --   --  215  --   --   --   --   --  229  --   APTT 73*  --   --   --   --  124*  --   --   --   --   --  143*  --   HEPARINUNFRC <0.10*  --  <0.10*   < > 0.32 0.31  --   --  0.35  --   --   --   --   CREATININE  --    < > 3.59*   < >  --  3.29*  3.32*  --  3.05* 3.13*  --   --  3.15*  2.99*  --   TROPONINIHS  --   --   --   --   --   --   --   --   --   --  30*  --   --    < > = values in this interval not displayed.    Estimated Creatinine Clearance: 39 mL/min (A) (by C-G formula based on SCr of 3.15 mg/dL (H)).   Medical History: Past Medical History:  Diagnosis Date   Atrial fibrillation [facility])    Hyperthyroidism    Mitral regurgitation    NSTEMI (non-ST elevated myocardial infarction) [facility])     Assessment: 38 yo M with bilateral PE s/p TNK (2/27 @1156 ) now on [place] ECMO + Impella. Pharmacy consulted for bivalirudin for anticoagulation.   S/p impella CP >5.5 3/3 - bivalirudin was previously held with oozing at insertion site but restart 3/4. Patient underwent mitraclip 3/5.  Heparin was stopped on 3/12@0430  for Impella 5.5 removal today. Hgb 9, plt 229. Fibrinogen >800, LDH 715. Was  previously therapeutic on heparin infusion at 1850 units/hr - of note, also receiving heparin in CRRT circuit at 500 units/hr given has been clotting off CRRT filter. No s/sx of bleeding.   Goal of Therapy:  Heparin level 0.3-0.5 units/mL Monitor platelets by anticoagulation protocol: Yes   Plan:  Holding on systemic heparin at this time Continue heparin 500 units/hr in CRRT circuit  Plan to reassess systemic heparin restart on 3/13 AM  Monitor CBC, and for s/sx of bleeding  Thank you for allowing pharmacy to participate in this patient's care,  Sherron Monday, PharmD, BCCCP Clinical Pharmacist  Phone: [phone] 07/23/2023 1:49 PM  Please check AMION for all [facility] Pharmacy phone numbers After 10:00  PM, call Main Pharmacy [phone]

## 2023-07-23 NOTE — Progress Notes (Addendum)
 Peripherally Inserted Central Catheter Placement  The IV Nurse has discussed with the patient and/or persons authorized to consent for the patient, the purpose of this procedure and the potential benefits and risks involved with this procedure.  The benefits include less needle sticks, lab draws from the catheter, and the patient may be discharged home with the catheter. Risks include, but not limited to, infection, bleeding, blood clot (thrombus formation), and puncture of an artery; nerve damage and irregular heartbeat and possibility to perform a PICC exchange if needed/ordered by physician.  Alternatives to this procedure were also discussed.  Bard Power PICC patient education guide, fact sheet on infection prevention and patient information card has been provided to patient /or left at bedside.    PICC Placement Documentation  PICC Triple Lumen 07/23/23 Right Brachial 41 cm 0 cm (Active)  Indication for Insertion or Continuance of Line Vasoactive infusions 07/23/23 1500  Exposed Catheter (cm) 0 cm 07/23/23 1500  Site Assessment Clean, Dry, Intact 07/23/23 1500  Lumen #1 Status Flushed;Blood return noted;Saline locked 07/23/23 1500  Lumen #2 Status Flushed;Blood return noted;Saline locked 07/23/23 1500  Lumen #3 Status Flushed;Blood return noted;Saline locked 07/23/23 1500  Dressing Type Transparent 07/23/23 1500  Dressing Status Antimicrobial disc/dressing in place 07/23/23 1500  Line Care Connections checked and tightened 07/23/23 1500  Line Adjustment (NICU/IV Team Only) No 07/23/23 1500  Dressing Intervention New dressing 07/23/23 1500  Dressing Change Due 07/30/23 07/23/23 1500    Patient's sister, Aaron [name], signed PICC consent via telephone. Verified with 2 IV/PICC RNs.   Annett Fabian 07/23/2023, 3:52 PM

## 2023-07-23 NOTE — Brief Op Note (Signed)
 07/23/2023  12:45 PM  PATIENT:  Janet Berlin Levitz  38 y.o. male  PRE-OPERATIVE DIAGNOSIS:  CARDIOGENIC SHOCK  POST-OPERATIVE DIAGNOSIS:  CARDIOGENIC SHOCK, No longer needs LVAD  PROCEDURE:  Procedure(s): REMOVAL, CARDIAC ASSIST DEVICE, IMPELLA (N/A) ECHOCARDIOGRAM, TRANSESOPHAGEAL, INTRAOPERATIVE (N/A)  SURGEON:  Surgeons and Role:    * Loreli Slot, MD - Primary  PHYSICIAN ASSISTANT:   ASSISTANTS: Caroline More, RNFA  ANESTHESIA:   general  EBL:  minimal   BLOOD ADMINISTERED:none  DRAINS: none   LOCAL MEDICATIONS USED:  NONE  SPECIMEN:  No Specimen  DISPOSITION OF SPECIMEN:  N/A  COUNTS:  YES  TOURNIQUET:  * No tourniquets in log *  DICTATION: .Other Dictation: Dictation Number -  PLAN OF CARE:  return to ICU  PATIENT DISPOSITION:  ICU - intubated and hemodynamically stable.   Delay start of Pharmacological VTE agent (>24hrs) due to surgical blood loss or risk of bleeding: no

## 2023-07-23 NOTE — Interval H&P Note (Signed)
 History and Physical Interval Note:  Remains hemodynamically stable on p2 Will proceed with Impella removal  07/23/2023 8:06 AM  Aaron [name]  has presented today for surgery, with the diagnosis of SHOCK.  The various methods of treatment have been discussed with the patient and family. After consideration of risks, benefits and other options for treatment, the patient has consented to  Procedure(s): REMOVAL, CARDIAC ASSIST DEVICE, IMPELLA (N/A) ECHOCARDIOGRAM, TRANSESOPHAGEAL, INTRAOPERATIVE (N/A) as a surgical intervention.  The patient's history has been reviewed, patient examined, no change in status, stable for surgery.  I have reviewed the patient's chart and labs.  Questions were answered to the patient's satisfaction.     Loreli Slot

## 2023-07-23 NOTE — Progress Notes (Signed)
  Echocardiogram Echocardiogram Transesophageal has been performed.  Aaron [name] 07/23/2023, 9:45 AM

## 2023-07-23 NOTE — Transfer of Care (Signed)
 Immediate Anesthesia Transfer of Care Note  Patient: Aaron [name]  Procedure(s) Performed: REMOVAL, CARDIAC ASSIST DEVICE, IMPELLA (Chest) ECHOCARDIOGRAM, TRANSESOPHAGEAL, INTRAOPERATIVE  Patient Location: SICU  Anesthesia Type:General  Level of Consciousness: sedated  Airway & Oxygen Therapy: Patient remains intubated per anesthesia plan and Patient placed on Ventilator (see vital sign flow sheet for setting)  Post-op Assessment: Report given to RN and Post -op Vital signs reviewed and stable  Post vital signs: Reviewed and stable  Last Vitals:  Vitals Value Taken Time  BP 164/89   Temp 37   Pulse 87 07/23/23 1026  Resp 21 07/23/23 1026  SpO2 89 % 07/23/23 1026  Vitals shown include unfiled device data.  Last Pain:  Vitals:   07/23/23 0800  TempSrc: Core  PainSc:          Complications: No notable events documented.

## 2023-07-23 NOTE — Progress Notes (Signed)
 PICC order received. Per secure chat with Nephrology, Maxie Barb, MD, Ok to place PICC.

## 2023-07-23 NOTE — Progress Notes (Signed)
 Patient ID: Aaron [name], male   DOB: July 25, 1985, 38 y.o.   MRN: [id number]     Advanced Heart Failure Rounding Note  Cardiologist: Christell Constant, MD  Chief Complaint: V-A ECMO  Subjective:    Admitted 2/26 with worsening shortness of breath, chest pain, atrial fibrillation with RVR. Decompensated 2/27 with IVF, nodal blockade with subsequent respiratory arrest, ROSC achieved after ~16 minute down time Femoral VA ecmo cannulation 2/27 with Impella CP vent  Patient is on milrinone 0.125.  He is off pressors and NO.  MAP stable.  CVVH ongoing, 50 cc/hr net negative UF with I/Os -1737.   Amiodarone gtt 60 mg/hr + mexiletine, remains in NSR this morning.   Low grade fever 99.9 with WBCs 26 => 30 => 42.  He is on vancomycin/meropenem/micafungin.   Echo (3/10): EF 35-40%, anterior and septal HK, normal RV, s/p Mitraclip with mild MR and mean gradient 7 mmHg.   Swan: RA 8 PA 53/25 CI 2.85 Co-ox 64%  Impella 5.5 P2 Flow 1.4 L/min Heparin gtt LDH 739 => 633 => 715   Objective:   Weight Range: 87.4 kg Body mass index is 23.45 kg/m.   Vital Signs:   Temp:  [98.4 F (36.9 C)-99.9 F (37.7 C)] 99.5 F (37.5 C) (03/12 0700) Pulse Rate:  [59-102] 76 (03/12 0500) Resp:  [15-27] 17 (03/12 0700) SpO2:  [90 %-100 %] 99 % (03/12 0500) Arterial Line BP: (95-154)/(48-78) 95/51 (03/12 0700) FiO2 (%):  [40 %-50 %] 50 % (03/12 0246) Weight:  [87.4 kg] 87.4 kg (03/12 0500) Last BM Date : 07/22/23  Weight change: Filed Weights   07/21/23 0500 07/22/23 0500 07/23/23 0500  Weight: 90.8 kg 90.2 kg 87.4 kg    Intake/Output:   Intake/Output Summary (Last 24 hours) at 07/23/2023 0734 Last data filed at 07/23/2023 0700 Gross per 24 hour  Intake 5250.9 ml  Output 7126 ml  Net -1875.1 ml      Physical Exam    General: Sedated on vent Neck: No JVD, no thyromegaly or thyroid nodule.  Lungs: Decreased at bases CV: Nondisplaced PMI.  Heart regular S1/S2, no S3/S4, no murmur.   No peripheral edema.    Abdomen: Soft, nontender, no hepatosplenomegaly, no distention.  Skin: Intact without lesions or rashes.  Neurologic: Sedated Extremities: No clubbing or cyanosis.  HEENT: Normal.   Telemetry   NSR 80s (personally reviewed)  Labs    CBC Recent Labs    07/22/23 0412 07/22/23 0415 07/23/23 0414 07/23/23 0415  WBC 30.4*  --  41.8*  --   HGB 8.2*   < > 9.0* 10.2*  HCT 25.3*   < > 27.9* 30.0*  MCV 88.5  --  90.6  --   PLT 215  --  229  --    < > = values in this interval not displayed.   Basic Metabolic Panel Recent Labs    96/78/93 0412 07/22/23 0415 07/22/23 1511 07/22/23 1636 07/22/23 1649 07/23/23 0414 07/23/23 0415  NA 132*  132*   < > 128* 129*   < > 133*  132* 133*  K 4.0  4.0   < > 4.7 5.1   < > 5.3*  5.2* 5.2*  CL 99  100  --  98 96*  --  98  97*  --   CO2 22  21*  --  20* 20*  --  21*  21*  --   GLUCOSE 133*  132*  --  160* 166*  --  117*  116*  --   BUN 54*  55*  --  53* 53*  --  54*  54*  --   CREATININE 3.29*  3.32*  --  3.05* 3.13*  --  3.15*  2.99*  --   CALCIUM 8.5*  8.4*  --  8.2* 8.5*  --  8.7*  9.0  --   MG 2.6*  --   --   --   --  2.6*  --   PHOS 1.8*  --  2.7  --   --  3.9  --    < > = values in this interval not displayed.   Liver Function Tests Recent Labs    07/22/23 0412 07/22/23 1511 07/23/23 0414  AST 37  --  59*  ALT 16  --  24  ALKPHOS 135*  --  174*  BILITOT 1.3*  --  1.4*  PROT 6.8  --  8.0  ALBUMIN 2.1*  2.1* 2.4* 2.4*  2.4*    BNP: BNP (last 3 results) Recent Labs    07/09/23 1934 07/10/23 1224  BNP 703.7* 980.5*   Thyroid Function Tests No results for input(s): "TSH", "T4TOTAL", "T3FREE", "THYROIDAB" in the last 72 hours.  Invalid input(s): "FREET3"    Medications:     Scheduled Medications:  acetaminophen  650 mg Per Tube Q6H   amiodarone  150 mg Intravenous Once   aspirin  81 mg Oral Daily   Chlorhexidine Gluconate Cloth  6 each Topical Daily   clonazePAM  1  mg Oral BID   feeding supplement (PROSource TF20)  60 mL Per Tube BID   fentaNYL (SUBLIMAZE) injection  50 mcg Intravenous Once   fiber supplement (BANATROL TF)  60 mL Per Tube BID   folic acid  1 mg Oral Daily   insulin aspart  0-20 Units Subcutaneous Q4H   insulin aspart  5 Units Subcutaneous Q4H   methimazole  10 mg Oral Q8H   mexiletine  250 mg Per Tube Q12H   multivitamin  1 tablet Oral QHS   mupirocin ointment  1 Application Nasal BID   mouth rinse  15 mL Mouth Rinse Q2H   oxyCODONE  5 mg Oral Q6H   pantoprazole (PROTONIX) IV  40 mg Intravenous QHS   sodium chloride flush  3 mL Intravenous Q12H   thiamine  100 mg Oral Daily   Or   thiamine  100 mg Intravenous Daily    Infusions:  amiodarone 60 mg/hr (07/23/23 0700)   dexmedetomidine (PRECEDEX) IV infusion 0.5 mcg/kg/hr (07/23/23 0700)   feeding supplement (PIVOT 1.5 CAL) Stopped (07/23/23 0001)   heparin 10,000 units/ 20 mL infusion syringe 500 Units/hr (07/22/23 1855)   HYDROmorphone 4 mg/hr (07/23/23 0700)   meropenem (MERREM) IV Stopped (07/23/23 0544)   micafungin (MYCAMINE) 200 mg in sodium chloride 0.9 % 100 mL IVPB Stopped (07/22/23 0900)   midazolam Stopped (07/20/23 1813)   milrinone 0.125 mcg/kg/min (07/23/23 0700)   norepinephrine (LEVOPHED) Adult infusion Stopped (07/22/23 0907)   prismasol BGK 4/2.5 1,500 mL/hr at 07/23/23 0723   prismasol BGK 4/2.5 500 mL/hr at 07/22/23 1454   prismasol BGK 4/2.5 400 mL/hr at 07/22/23 2237   propofol (DIPRIVAN) infusion 55 mcg/kg/min (07/23/23 0700)   sodium bicarbonate 25 mEq (Impella PURGE) in dextrose 5 % 1000 mL bag 5.8 mL/hr at 07/13/23 0248   vancomycin Stopped (07/22/23 1212)   vasopressin Stopped (07/22/23 1246)    PRN Medications: docusate, heparin, HYDROmorphone, midazolam, mouth rinse, polyethylene glycol  Patient Profile   Patient with a history of Grave's disease, severe primary mitral regurgitation who presented with chest pain and segmental PE.  Subsequent progressed to SCAI Stage E shock requiring VA ECMO cannulation on 2/27.   Assessment/Plan   SCAI Stage E Cardiogenic shock - Suspect hypoxic respiratory failure driven with segmental PE on top of severe MR, nodal blockage, and thyrotoxicosis - Down time ~ 20 minutes, good mental status confirmed post code - We performed a mini-turn down on 07/15/23 at bedside; ECMO flows were reduced in a stepwise pattern by 0.5LPM to a goal of 2.5LPM. During this time, impella 5.5 flows increased to maintain net even flows. With reduction of ECMO flows, patient had onset of frequent PVCs/NSVT requiring amio boluses. In addition, despite increased LV unloading patient continued to have severe 4+ MR with progressive RV failure on bedside TTE.  - Underwent successful mTEER on 07/16/23 with placement of x3 clips and improvement in MR from severe to mild. Mean mitral gradient of 3-24mmHg.  - Decannulated from V-A ECMO by Dr. Karin Lieu on 07/19/23 with vascular cut down and primary repair of left femoral artery.  - Echo on 3/10 showed EF 35-40%, anterior and septal HK, normal RV, s/p Mitraclip with mild MR and mean gradient 7 mmHg.  - CVVH ongoing, net negative 50 cc/hr.  CVP 8 today. Continue UF today via CVVH at 50 cc/hr net negative.   - He is on milrinone 0.125, off pressors and NO.  CI 2.85 with co-ox 64%, this is is adequate.   - PA 53/25 off NO.   - Impella 5.5 is on P2 this morning.  Plan to remove today.  Heparin off.   Severe mitral valve regurgitation - Noted on previous echocardiogram - Now s/p mTEER on 07/16/23; improvement in MR from severe to mild.  - No murmur on exam  Pulmonary hypertension - PA pressures reached 80s/30s with PCWP of ~10, TPG of ~40mmHg suggestive of PVR >5 on 07/17/22 - PA 53/25; now off NO. Overall observational data is not supportive with the use of sildenafil in fixed PH secondary to MS/MR.   VT/NSVT - Frequent runs of NSVT likely secondary to impella 5.5 position - Now on  mexiletine + amiodarone 60mg /hr - No further VT currently but will have to follow closely when we are trying to extubate as VT as accompanied sedation/vent wean in the past.  Removing Impella may help decrease risk of VT.    ID - Still on vancomycin/meropenem - Bronchoscopy with thick purulent secretions - Started on micafungin on 07/19/23 - WBCs 22.8 => 26 => 30 => 42 with low grade fever to 99.9 (in setting of CVVH) - Per CCM  Anemia - Hgb 9 today, transfuse hgb < 7.5.   Acute renal failure/hyperkalemia - CVVH ongoing, continue net negative UF aim for even to -50 cc/hr today with CVP 8.   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   Atrial fibrillation - Present on arrival, in the setting of PE and thyroid disease - sinus rhythm today.  - On amiodarone.   Pulmonary embolism:  - Segmental without right heart strain - Restart heparin gtt after Impella removed.    CAD - Prior embolic infarct in the LAD territory with corresponding LGE on CMR - Lifelong anticoagulation - On heparin gtt here.   Sedation - Propofol/versed - Slow wean given  agitation.    CRITICAL CARE Performed by: Marca Ancona  Total critical care time: 50 minutes  Critical care time was exclusive of separately billable procedures and treating other patients.  Critical care was necessary to treat or prevent imminent or life-threatening deterioration.  Critical care was time spent personally by me on the following activities: development of treatment plan with patient and/or surrogate as well as nursing, discussions with consultants, evaluation of patient's response to treatment, examination of patient, obtaining history from patient or surrogate, ordering and performing treatments and interventions, ordering and review of laboratory studies, ordering and review of radiographic  studies, pulse oximetry and re-evaluation of patient's condition.   Length of Stay: 27  Marca Ancona, MD  07/23/2023, 7:34 AM  Advanced Heart Failure Team Pager [phone] (M-F; 7a - 5p)  Please contact [facility] for night-coverage after hours (5p -7a ) and weekends on [email or web address]

## 2023-07-23 NOTE — Progress Notes (Signed)
 NAME:  Aaron [name], MRN:  [id number], DOB:  07/17/1985, LOS: 14 ADMISSION DATE:  07/09/2023, CONSULTATION DATE:  07/11/2023 REFERRING MD: Clearnce Hasten, CHIEF COMPLAINT: Status post cardiac arrest  History of Present Illness:  38 year old male with hypothyroidism, paroxysmal A-fib, severe mitral regurgitation who presented to [facility] with chest pain and shortness of breath for few days associated cough, nausea and diarrhea.  In the emergency department he was diagnosed with PE, was started on IV heparin.  He takes beta-blocker, steroid and methimazole for hyperthyroidism.  On 2/27 patient rapidly deteriorated developed V. tach/V-fib cardiac arrest patient was intubated after 40 minutes of CPR, cardiology was consulted patient was placed on VA ECMO and was transferred to [facility]  Pertinent  Medical History   Past Medical History:  Diagnosis Date   Atrial fibrillation [facility])    Hyperthyroidism    Mitral regurgitation    NSTEMI (non-ST elevated myocardial infarction) (HCC)     Significant Hospital Events: Including procedures, antibiotic start and stop dates in addition to other pertinent events   3/5 salvage TEER (transcatheter edge to edge mitral valve repair 3/7 weaned off VA  Interim History / Subjective:  In better spot now sedated more Remains on CRRT For OR this am  Objective   Blood pressure (!) 141/71, pulse 74, temperature 99.5 F (37.5 C), resp. rate 14, height 6\' 4"  (1.93 m), weight 87.4 kg, SpO2 99%. PAP: (30-80)/(22-32) 44/25 CVP:  [0 mmHg-21 mmHg] 2 mmHg CO:  [6.5 L/min-10.3 L/min] 6.5 L/min CI:  [2.85 L/min/m2-4.5 L/min/m2] 2.85 L/min/m2  Vent Mode: PRVC FiO2 (%):  [40 %-50 %] 50 % Set Rate:  [18 bmp] 18 bmp Vt Set:  [610 mL] 610 mL PEEP:  [5 cmH20] 5 cmH20 Plateau Pressure:  [7 cmH20-17 cmH20] 7 cmH20   Intake/Output Summary (Last 24 hours) at 07/23/2023 0820 Last data filed at 07/23/2023 0800 Gross per 24 hour  Intake 5113.51 ml   Output 6898 ml  Net -1784.49 ml   Filed Weights   07/21/23 0500 07/22/23 0500 07/23/23 0500  Weight: 90.8 kg 90.2 kg 87.4 kg    Examination: No distress Improved L breath sounds this am Ext warm Impella in place P3 Sinus rhythm w/ improved QRS appearance  K bath being changed WBC going up some more  Resolved Hospital Problem list   Lactic acidosis, resolved Refractory hyperkalemia, improving  Assessment & Plan:  Baseline severe mitral insufficiency with superimposed RV>LV shock state culminating in prolonged IHCA s/p VA ECMO 2/27- salvage TEER 3/5, weaned from [place] to impella 3/7.  Post arrest encephalopathy- following commands last week per nursing, neg head imaging; will need to be a rapid wean once ready to extubate Acute renal failure- on CRRT Thyrotoxicosis- on methimazole, stress steroids; stress steroids off on 3/8 Leukamoid reaction- continues to rise as well as low grade fevers; on broad abx and antifungals; suspicion here is this is related to hardware  Bival resumption per pharmD TSH check Friday Continue broad specturm abx, hopefully curve improves once impella is out otherwise may need to try to do line holiday After impella removal will work on vent liberation CRRT pull per nephro/cards discussion  Best Practice (right click and "Reselect all SmartList Selections" daily)   Diet/type: NPO TF DVT prophylaxis systemic bival off for impella removal Pressure ulcer(s): N/A GI prophylaxis: PPI Lines: Central line, Arterial Line, and yes and it is still needed.   Foley:  Yes, and it is still needed Code Status:  full code Last date of multidisciplinary goals of care discussion [Per primary team]  32 min cc time Myrla Halsted MD PCCM

## 2023-07-23 NOTE — Anesthesia Preprocedure Evaluation (Addendum)
 Anesthesia Evaluation  Patient identified by MRN, date of birth, ID band Patient unresponsive    Reviewed: Allergy & Precautions, H&P , NPO status , Patient's Chart, lab work & pertinent test results, reviewed documented beta blocker date and time , Unable to perform ROS - Chart review only  History of Anesthesia Complications Negative for: history of anesthetic complications  Airway Mallampati: Intubated       Dental no notable dental hx.    Pulmonary       + intubated    Cardiovascular hypertension, + Past MI  + dysrhythmias Atrial Fibrillation  Rhythm:Regular Rate:Tachycardia  VA ECMO from 2/27 to 3/7.  Impella placed 3/3.  Mitraclip placed 3/5.  Echo (3/10): EF 35-40%, anterior and septal HK, normal RV, s/p Mitraclip with mild MR and mean gradient 7 mmHg.      Neuro/Psych    GI/Hepatic   Endo/Other   Hyperthyroidism   Renal/GU Renal diseaseCVVH     Musculoskeletal   Abdominal   Peds  Hematology  (+) Blood dyscrasia, anemia   Anesthesia Other Findings   Reproductive/Obstetrics                             Anesthesia Physical Anesthesia Plan  ASA: 4  Anesthesia Plan: General   Post-op Pain Management:    Induction: Inhalational  PONV Risk Score and Plan: 2 and Ondansetron and Dexamethasone  Airway Management Planned: Oral ETT  Additional Equipment: Arterial line, CVP, PA Cath and TEE  Intra-op Plan:   Post-operative Plan: Post-operative intubation/ventilation  Informed Consent: I have reviewed the patients History and Physical, chart, labs and discussed the procedure including the risks, benefits and alternatives for the proposed anesthesia with the patient or authorized representative who has indicated his/her understanding and acceptance.     Dental advisory given  Plan Discussed with: CRNA, Anesthesiologist and Surgeon  Anesthesia Plan Comments:          Anesthesia Quick Evaluation

## 2023-07-23 NOTE — Progress Notes (Signed)
 Pharmacy Antibiotic Note  Aaron [name] is a 38 y.o. male admitted on 07/09/2023 with SOB ultimately required  ECMO cannulation 2/28 + Impella CP > 5.5 then mitraclip 3/5, decannulation 3/7. Continues on CRRT. Pharmacy has been consulted for vancomcyin, meropenem and micafungin dosing.  Currently on meropenem/vancomycin (on day #13) along with micafungin on 3/10 (day #4 total). WBC continues to increase to 41, Tmax (on CRRT) 100.2. Scr 3.1 (on CRRT). No growth on cx outside of trach aspirate 2/28 showing rare staph aureus (sensitivities pan-sensitive MSSA).  Vancomycin trough this morning came back elevated at 25. CRRT restarted after OR for impella removal around noon today.   Plan: Reduce vancomycin at 1250 mg IV q24h - will start it on 3/12 at 1400 Meropenem 1gm IV q8h  Micafungin 200 mg IV every 24 hours Monitor cx results, clinical pic, CRRT tolerance,  and vanc levels as needed   Height: 6\' 4"  (193 cm) Weight: 87.4 kg (192 lb 10.9 oz) IBW/kg (Calculated) : 86.8  Temp (24hrs), Avg:99.6 F (37.6 C), Min:99.1 F (37.3 C), Max:100.2 F (37.9 C)  Recent Labs  Lab 07/17/23 0325 07/17/23 0636 07/18/23 0403 07/18/23 1228 07/18/23 1231 07/18/23 1531 07/18/23 2108 07/19/23 0347 07/19/23 0358 07/19/23 0403 07/19/23 1608 07/20/23 0501 07/20/23 1624 07/21/23 0418 07/21/23 1608 07/22/23 0412 07/22/23 1511 07/22/23 1636 07/23/23 0414 07/23/23 0843  WBC  --    < >  --   --   --    < >  --  32.6*  --   --   --  21.8*  --  26.0*  --  30.4*  --   --  41.8*  --   CREATININE  --    < >  --    < >  --    < >  --   --    < >  --    < > 4.88*   < > 3.59* 3.40*  3.46* 3.29*  3.32* 3.05* 3.13* 3.15*  2.99*  --   LATICACIDVEN 0.9  --  0.8  --  0.8  --  0.6  --   --  0.7  --   --   --   --   --   --   --   --   --   --   VANCOTROUGH  --   --   --   --   --   --   --   --   --   --   --   --   --   --   --   --   --   --   --  25*   < > = values in this interval not displayed.     Estimated Creatinine Clearance: 39 mL/min (A) (by C-G formula based on SCr of 3.15 mg/dL (H)).    No Known Allergies  Thank you for allowing pharmacy to participate in this patient's care,  Sherron Monday, PharmD, BCCCP Clinical Pharmacist  Phone: [phone] 07/23/2023 1:42 PM  Please check AMION for all [facility]acy phone numbers After 10:00 PM, call Main Pharmacy [phone]

## 2023-07-23 NOTE — Procedures (Signed)
 Extubation Procedure Note  Patient Details:   Name: DOMINICK MORELLA DOB: 08-17-1985 MRN: [id number]   Airway Documentation:    Vent end date: 07/23/23 Vent end time: 1450   Evaluation  O2 sats: stable throughout Complications: No apparent complications Patient did tolerate procedure well. Bilateral Breath Sounds: Clear, Diminished   No, pt could not speak post extubation.  Pt extubated to [facility] per physician's order.  Audrie Lia 07/23/2023, 2:52 PM

## 2023-07-23 NOTE — Progress Notes (Signed)
 Patient taken to have impella removed at 8:30 this morning. After returning and getting CRRT restarted sedation was cut off around 1400 in order to wean and extubate.  Patient very sleepy but still ok to extubate per Dr. Katrinka Blazing. Patient extubated to heated high flow and transitioned to Bi-Pap to better support him until he wakes up and is able to follow commands.  He pulse-ox is reading in the high 90s and he appears to be comfortable.

## 2023-07-23 NOTE — TOC Progression Note (Signed)
 Transition of Care [facility]) - Progression Note    Patient Details  Name: LEKENDRICK ALPERN MRN: [id number] Date of Birth: 1986-04-06  [facility] of [facility] CM/SW Contact  Nicanor Bake Phone Number: [phone] 07/23/2023, 10:28 AM  Clinical Narrative:   10:07 AM- HF CSW spoke with pts sister over the phone. Ps sister stated that last week she faxed over the pts FMLA paperwork and has not heard back form anyone. Pts sister stated that she has made attempts to contact the office, but no one has followed up with her. CSW inquired via secure chat with MD and nurse and awaiting response.   TOC will continue following.     Expected Discharge Plan: IP Rehab Facility Barriers to Discharge: Continued Medical Work up  Expected Discharge Plan and Services   Discharge Planning Services: CM Consult   Living arrangements for the past 2 months: Apartment                                       Social Determinants of Health (SDOH) Interventions SDOH Screenings   Food Insecurity: No Food Insecurity (07/10/2023)  Housing: Low Risk  (07/10/2023)  Transportation Needs: No Transportation Needs (07/10/2023)  Utilities: Not At Risk (07/10/2023)  Financial Resource Strain: Medium Risk (04/01/2023)  Tobacco Use: Low Risk  (07/10/2023)  Health Literacy: Adequate Health Literacy (04/01/2023)    Readmission Risk Interventions    07/10/2023   10:12 AM  Readmission Risk Prevention Plan  Transportation Screening Complete  PCP or Specialist Appt within 5-7 Days Complete  Home Care Screening Complete  Medication Review (RN CM) Complete

## 2023-07-23 NOTE — Op Note (Signed)
 NAMEHIROKI, Aaron [name] MEDICAL RECORD NO: [id number] ACCOUNT NO: [id number] DATE OF BIRTH: August 10, 1985 FACILITY: [facility] LOCATION: [facility] PHYSICIAN: Salvatore Decent. Dorris Fetch, MD  Operative Report   DATE OF PROCEDURE: 07/23/2023  PREOPERATIVE DIAGNOSIS: Cardiogenic shock requiring mechanical ventricular assistance.  POSTOPERATIVE DIAGNOSIS: Resolved cardiogenic shock, no longer needs mechanical ventricular assistance.  PROCEDURE: Removal of Impella.  SURGEON: Salvatore Decent. Dorris Fetch, MD  ASSISTANT: Tanda Rockers, RNFA.   Experienced assistance is necessary for this case due to risk of significant bleeding. Aaron [name] provided assistance with retraction, exposure, and suture management during wound closure.   ANESTHESIA: General.  FINDINGS: Impella removed without any complications. No arrhythmias. No change in hemodynamics.  CLINICAL NOTE: Aaron [name] is a 38 year old gentleman with severe mitral regurgitation who had a ventricular fibrillation arrest resulting in need for VA ECMO. He then had an Impella 5.5 placed to allow him to be weaned from ECMO. That was done. The patient now has weaned down to P2 with 1.2 liters of flow on the Impella device with excellent cardiac output and no longer needs the mechanical assist device. His family was advised of the need for Impella removal. The indications, risks, benefits, and alternatives were discussed in detail with the patient's family. They gave consent as the patient is intubated and sedated.  OPERATIVE NOTE: Aaron [name]  was brought to the operating room on 07/23/2023. He was already intubated and sedated. He was moved to the operating table. He was in sinus rhythm with good hemodynamics. He had induction of general anesthesia. Dr. Randon Goldsmith of anesthesia performed transesophageal echocardiography. Please see his separately dictated note for full details of that procedure. It did show good result with the MitraClip with minimal residual MR and there was  good left ventricular wall motion. The chest and right shoulder were prepped and draped in the usual sterile fashion.  A time-out was performed. The incision in the right deltopectoral groove was opened. The clot was evacuated and the graft was exposed. The Impella was decreased to P1 with no hemodynamic change. The Impella then was removed from the left ventricle and turned off. There were no arrhythmias and no hemodynamic changes with removal. The Impella then was removed completely and the graft was clamped. It then was stapled with a Covidien stapler using a purple cartridge. There was good hemostasis at the staple line. The chest was copiously irrigated with saline. All old hematoma was evacuated. The fascia was closed with a running #1 Vicryl suture. The subcutaneous tissue was closed with 2-0 Vicryl suture and the skin was closed with staples. All sponge, needle, and instrument counts were correct at the end of the procedure. The patient remained hemodynamically stable. Cardiac output went from 7.2 liters per minute prior to removal to 7.5 liters per minute post removal. The patient then was transported from the operating room back to the surgical intensive care unit, intubated and in stable condition.     SUJ D: 07/23/2023 12:52:22 pm T: 07/23/2023 10:03:00 pm  JOB: [id number]

## 2023-07-24 ENCOUNTER — Inpatient Hospital Stay (HOSPITAL_COMMUNITY)

## 2023-07-24 ENCOUNTER — Encounter (HOSPITAL_COMMUNITY): Payer: Self-pay | Admitting: Thoracic Surgery (Cardiothoracic Vascular Surgery)

## 2023-07-24 DIAGNOSIS — R0602 Shortness of breath: Secondary | ICD-10-CM | POA: Diagnosis not present

## 2023-07-24 DIAGNOSIS — N170 Acute kidney failure with tubular necrosis: Secondary | ICD-10-CM | POA: Diagnosis not present

## 2023-07-24 DIAGNOSIS — I639 Cerebral infarction, unspecified: Secondary | ICD-10-CM | POA: Diagnosis not present

## 2023-07-24 DIAGNOSIS — Z452 Encounter for adjustment and management of vascular access device: Secondary | ICD-10-CM | POA: Diagnosis not present

## 2023-07-24 DIAGNOSIS — Z4682 Encounter for fitting and adjustment of non-vascular catheter: Secondary | ICD-10-CM | POA: Diagnosis not present

## 2023-07-24 DIAGNOSIS — E872 Acidosis, unspecified: Secondary | ICD-10-CM | POA: Diagnosis not present

## 2023-07-24 DIAGNOSIS — J9601 Acute respiratory failure with hypoxia: Secondary | ICD-10-CM | POA: Diagnosis not present

## 2023-07-24 DIAGNOSIS — E875 Hyperkalemia: Secondary | ICD-10-CM | POA: Diagnosis not present

## 2023-07-24 DIAGNOSIS — R918 Other nonspecific abnormal finding of lung field: Secondary | ICD-10-CM | POA: Diagnosis not present

## 2023-07-24 DIAGNOSIS — R079 Chest pain, unspecified: Secondary | ICD-10-CM | POA: Diagnosis not present

## 2023-07-24 DIAGNOSIS — I2699 Other pulmonary embolism without acute cor pulmonale: Secondary | ICD-10-CM | POA: Diagnosis not present

## 2023-07-24 DIAGNOSIS — I517 Cardiomegaly: Secondary | ICD-10-CM | POA: Diagnosis not present

## 2023-07-24 DIAGNOSIS — J9600 Acute respiratory failure, unspecified whether with hypoxia or hypercapnia: Secondary | ICD-10-CM | POA: Diagnosis not present

## 2023-07-24 DIAGNOSIS — J90 Pleural effusion, not elsewhere classified: Secondary | ICD-10-CM | POA: Diagnosis not present

## 2023-07-24 DIAGNOSIS — R570 Cardiogenic shock: Secondary | ICD-10-CM | POA: Diagnosis not present

## 2023-07-24 DIAGNOSIS — I469 Cardiac arrest, cause unspecified: Secondary | ICD-10-CM | POA: Diagnosis not present

## 2023-07-24 DIAGNOSIS — G931 Anoxic brain damage, not elsewhere classified: Secondary | ICD-10-CM | POA: Diagnosis not present

## 2023-07-24 LAB — COOXEMETRY PANEL
Carboxyhemoglobin: 2.1 % — ABNORMAL HIGH (ref 0.5–1.5)
Methemoglobin: 1 % (ref 0.0–1.5)
O2 Saturation: 77.5 %
Total hemoglobin: 9.2 g/dL — ABNORMAL LOW (ref 12.0–16.0)

## 2023-07-24 LAB — CBC
HCT: 26.3 % — ABNORMAL LOW (ref 39.0–52.0)
HCT: 22.8 % — ABNORMAL LOW (ref 39.0–52.0)
Hemoglobin: 8.5 g/dL — ABNORMAL LOW (ref 13.0–17.0)
Hemoglobin: 7.3 g/dL — ABNORMAL LOW (ref 13.0–17.0)
MCH: 28.9 pg (ref 26.0–34.0)
MCH: 29.7 pg (ref 26.0–34.0)
MCHC: 32.3 g/dL (ref 30.0–36.0)
MCHC: 32 g/dL (ref 30.0–36.0)
MCV: 89.5 fL (ref 80.0–100.0)
MCV: 92.7 fL (ref 80.0–100.0)
Platelets: 283 10*3/uL (ref 150–400)
Platelets: 242 10*3/uL (ref 150–400)
RBC: 2.94 MIL/uL — ABNORMAL LOW (ref 4.22–5.81)
RBC: 2.46 MIL/uL — ABNORMAL LOW (ref 4.22–5.81)
RDW: 21.1 % — ABNORMAL HIGH (ref 11.5–15.5)
RDW: 21.2 % — ABNORMAL HIGH (ref 11.5–15.5)
WBC: 46.9 10*3/uL — ABNORMAL HIGH (ref 4.0–10.5)
WBC: 35.9 10*3/uL — ABNORMAL HIGH (ref 4.0–10.5)
nRBC: 0.2 % (ref 0.0–0.2)
nRBC: 0.3 % — ABNORMAL HIGH (ref 0.0–0.2)

## 2023-07-24 LAB — POCT I-STAT 7, (LYTES, BLD GAS, ICA,H+H)
Acid-base deficit: 2 mmol/L (ref 0.0–2.0)
Bicarbonate: 23.3 mmol/L (ref 20.0–28.0)
Calcium, Ion: 1.1 mmol/L — ABNORMAL LOW (ref 1.15–1.40)
HCT: 27 % — ABNORMAL LOW (ref 39.0–52.0)
Hemoglobin: 9.2 g/dL — ABNORMAL LOW (ref 13.0–17.0)
O2 Saturation: 100 %
Patient temperature: 37
Potassium: 5.5 mmol/L — ABNORMAL HIGH (ref 3.5–5.1)
Sodium: 131 mmol/L — ABNORMAL LOW (ref 135–145)
TCO2: 25 mmol/L (ref 22–32)
pCO2 arterial: 39.1 mmHg (ref 32–48)
pH, Arterial: 7.383 (ref 7.35–7.45)
pO2, Arterial: 182 mmHg — ABNORMAL HIGH (ref 83–108)

## 2023-07-24 LAB — RENAL FUNCTION PANEL
Albumin: 2.2 g/dL — ABNORMAL LOW (ref 3.5–5.0)
Albumin: 2.1 g/dL — ABNORMAL LOW (ref 3.5–5.0)
Anion gap: 11 (ref 5–15)
Anion gap: 15 (ref 5–15)
BUN: 56 mg/dL — ABNORMAL HIGH (ref 6–20)
BUN: 60 mg/dL — ABNORMAL HIGH (ref 6–20)
CO2: 21 mmol/L — ABNORMAL LOW (ref 22–32)
CO2: 20 mmol/L — ABNORMAL LOW (ref 22–32)
Calcium: 8.3 mg/dL — ABNORMAL LOW (ref 8.9–10.3)
Calcium: 8.2 mg/dL — ABNORMAL LOW (ref 8.9–10.3)
Chloride: 98 mmol/L (ref 98–111)
Chloride: 99 mmol/L (ref 98–111)
Creatinine, Ser: 3.6 mg/dL — ABNORMAL HIGH (ref 0.61–1.24)
Creatinine, Ser: 4.17 mg/dL — ABNORMAL HIGH (ref 0.61–1.24)
GFR, Estimated: 21 mL/min — ABNORMAL LOW (ref >60)
GFR, Estimated: 18 mL/min — ABNORMAL LOW (ref >60)
Glucose, Bld: 133 mg/dL — ABNORMAL HIGH (ref 70–99)
Glucose, Bld: 117 mg/dL — ABNORMAL HIGH (ref 70–99)
Phosphorus: 4.5 mg/dL (ref 2.5–4.6)
Phosphorus: 5.1 mg/dL — ABNORMAL HIGH (ref 2.5–4.6)
Potassium: 5.5 mmol/L — ABNORMAL HIGH (ref 3.5–5.1)
Potassium: 4.5 mmol/L (ref 3.5–5.1)
Sodium: 130 mmol/L — ABNORMAL LOW (ref 135–145)
Sodium: 134 mmol/L — ABNORMAL LOW (ref 135–145)

## 2023-07-24 LAB — APTT: aPTT: 44 s — ABNORMAL HIGH (ref 24–36)

## 2023-07-24 LAB — LACTATE DEHYDROGENASE: LDH: 665 U/L — ABNORMAL HIGH (ref 98–192)

## 2023-07-24 LAB — GLUCOSE, CAPILLARY
Glucose-Capillary: 114 mg/dL — ABNORMAL HIGH (ref 70–99)
Glucose-Capillary: 94 mg/dL (ref 70–99)
Glucose-Capillary: 89 mg/dL (ref 70–99)
Glucose-Capillary: 110 mg/dL — ABNORMAL HIGH (ref 70–99)
Glucose-Capillary: 121 mg/dL — ABNORMAL HIGH (ref 70–99)

## 2023-07-24 LAB — HEPATIC FUNCTION PANEL
ALT: 25 U/L (ref 0–44)
AST: 61 U/L — ABNORMAL HIGH (ref 15–41)
Albumin: 2.2 g/dL — ABNORMAL LOW (ref 3.5–5.0)
Alkaline Phosphatase: 147 U/L — ABNORMAL HIGH (ref 38–126)
Bilirubin, Direct: 0.6 mg/dL — ABNORMAL HIGH (ref 0.0–0.2)
Indirect Bilirubin: 0.9 mg/dL (ref 0.3–0.9)
Total Bilirubin: 1.5 mg/dL — ABNORMAL HIGH (ref 0.0–1.2)
Total Protein: 7.6 g/dL (ref 6.5–8.1)

## 2023-07-24 LAB — FIBRINOGEN: Fibrinogen: 717 mg/dL — ABNORMAL HIGH (ref 210–475)

## 2023-07-24 LAB — HEPARIN LEVEL (UNFRACTIONATED): Heparin Unfractionated: 0.16 [IU]/mL — ABNORMAL LOW (ref 0.30–0.70)

## 2023-07-24 LAB — MAGNESIUM: Magnesium: 2.5 mg/dL — ABNORMAL HIGH (ref 1.7–2.4)

## 2023-07-24 LAB — AMMONIA: Ammonia: 63 umol/L — ABNORMAL HIGH (ref 9–35)

## 2023-07-24 LAB — PROCALCITONIN: Procalcitonin: 6.84 ng/mL

## 2023-07-24 LAB — LACTIC ACID, PLASMA: Lactic Acid, Venous: 0.8 mmol/L (ref 0.5–1.9)

## 2023-07-24 MED ORDER — ORAL CARE MOUTH RINSE
15.0000 mL | OROMUCOSAL | Status: DC
  Administered 2023-07-24 – 2023-07-27 (×35): 15 mL via OROMUCOSAL

## 2023-07-24 MED ORDER — FOLIC ACID 1 MG PO TABS
1.0000 mg | ORAL_TABLET | Freq: Every day | ORAL | Status: DC
  Administered 2023-07-24 – 2023-07-26 (×3): 1 mg
  Filled 2023-07-24 (×3): qty 1

## 2023-07-24 MED ORDER — MIDAZOLAM-SODIUM CHLORIDE 100-0.9 MG/100ML-% IV SOLN
INTRAVENOUS | Status: AC
Start: 2023-07-24 — End: 2023-07-24
  Filled 2023-07-24: qty 100

## 2023-07-24 MED ORDER — CLONAZEPAM 1 MG PO TABS
1.0000 mg | ORAL_TABLET | Freq: Two times a day (BID) | ORAL | Status: DC
  Administered 2023-07-24 – 2023-07-25 (×4): 1 mg
  Filled 2023-07-24 (×4): qty 1

## 2023-07-24 MED ORDER — PROPOFOL 1000 MG/100ML IV EMUL
INTRAVENOUS | Status: AC
  Filled 2023-07-24: qty 100

## 2023-07-24 MED ORDER — PROPOFOL 1000 MG/100ML IV EMUL
5.0000 ug/kg/min | INTRAVENOUS | Status: DC
  Administered 2023-07-24 (×3): 60 ug/kg/min via INTRAVENOUS
  Administered 2023-07-24: 45 ug/kg/min via INTRAVENOUS
  Administered 2023-07-24 (×2): 50 ug/kg/min via INTRAVENOUS
  Administered 2023-07-24: 45 ug/kg/min via INTRAVENOUS
  Administered 2023-07-25 (×4): 60 ug/kg/min via INTRAVENOUS
  Filled 2023-07-24 (×10): qty 100

## 2023-07-24 MED ORDER — THIAMINE MONONITRATE 100 MG PO TABS
100.0000 mg | ORAL_TABLET | Freq: Every day | ORAL | Status: DC
  Administered 2023-07-24 – 2023-07-31 (×8): 100 mg
  Filled 2023-07-24 (×8): qty 1

## 2023-07-24 MED ORDER — VITAL HIGH PROTEIN PO LIQD
1000.0000 mL | ORAL | Status: DC
Start: 2023-07-24 — End: 2023-07-25
  Administered 2023-07-24 – 2023-07-25 (×2): 1000 mL

## 2023-07-24 MED ORDER — ROCURONIUM BROMIDE 10 MG/ML (PF) SYRINGE: 100.0000 mg | PREFILLED_SYRINGE | Freq: Once | INTRAVENOUS | Status: AC

## 2023-07-24 MED ORDER — FENTANYL 2500MCG IN NS 250ML (10MCG/ML) PREMIX INFUSION
0.0000 ug/h | INTRAVENOUS | Status: DC
  Administered 2023-07-24: 250 ug/h via INTRAVENOUS
  Administered 2023-07-24: 50 ug/h via INTRAVENOUS
  Administered 2023-07-25: 350 ug/h via INTRAVENOUS
  Administered 2023-07-25: 300 ug/h via INTRAVENOUS
  Administered 2023-07-25: 350 ug/h via INTRAVENOUS
  Administered 2023-07-26: 250 ug/h via INTRAVENOUS
  Filled 2023-07-24 (×6): qty 250

## 2023-07-24 MED ORDER — FENTANYL CITRATE PF 50 MCG/ML IJ SOSY
50.0000 ug | PREFILLED_SYRINGE | Freq: Once | INTRAMUSCULAR | Status: AC
  Administered 2023-07-24: 50 ug via INTRAVENOUS
  Filled 2023-07-24: qty 1

## 2023-07-24 MED ORDER — PRISMASOL BGK 0/2.5 32-2.5 MEQ/L EC SOLN: Status: DC

## 2023-07-24 MED ORDER — HEPARIN (PORCINE) 25000 UT/250ML-% IV SOLN
1950.0000 [IU]/h | INTRAVENOUS | Status: DC
  Administered 2023-07-24: 1850 [IU]/h via INTRAVENOUS
  Administered 2023-07-24 – 2023-07-26 (×4): 1950 [IU]/h via INTRAVENOUS
  Filled 2023-07-24 (×6): qty 250

## 2023-07-24 MED ORDER — ASPIRIN 81 MG PO CHEW
81.0000 mg | CHEWABLE_TABLET | Freq: Every day | ORAL | Status: DC
  Administered 2023-07-24 – 2023-07-26 (×3): 81 mg
  Filled 2023-07-24 (×3): qty 1

## 2023-07-24 MED ORDER — ROCURONIUM BROMIDE 10 MG/ML (PF) SYRINGE
PREFILLED_SYRINGE | INTRAVENOUS | Status: AC
  Filled 2023-07-24: qty 10

## 2023-07-24 MED ORDER — FENTANYL BOLUS VIA INFUSION
50.0000 ug | INTRAVENOUS | Status: DC | PRN
  Administered 2023-07-24 (×2): 100 ug via INTRAVENOUS
  Administered 2023-07-25: 50 ug via INTRAVENOUS
  Administered 2023-07-25 (×2): 100 ug via INTRAVENOUS
  Administered 2023-07-25 (×2): 50 ug via INTRAVENOUS
  Administered 2023-07-25 (×2): 100 ug via INTRAVENOUS

## 2023-07-24 MED ORDER — ORAL CARE MOUTH RINSE: 15.0000 mL | OROMUCOSAL | Status: DC | PRN

## 2023-07-24 MED ORDER — METHIMAZOLE 10 MG PO TABS
10.0000 mg | ORAL_TABLET | Freq: Three times a day (TID) | ORAL | Status: DC
  Administered 2023-07-24 – 2023-07-25 (×3): 10 mg
  Filled 2023-07-24 (×3): qty 1

## 2023-07-24 MED ORDER — POLYETHYLENE GLYCOL 3350 17 G PO PACK: 17.0000 g | PACK | Freq: Every day | ORAL | Status: DC | PRN

## 2023-07-24 MED ORDER — DOCUSATE SODIUM 50 MG/5ML PO LIQD
100.0000 mg | Freq: Two times a day (BID) | ORAL | Status: DC | PRN
  Administered 2023-07-26: 100 mg
  Filled 2023-07-24: qty 10

## 2023-07-24 MED ORDER — THIAMINE HCL 100 MG/ML IJ SOLN
100.0000 mg | Freq: Every day | INTRAMUSCULAR | Status: DC
  Filled 2023-07-24: qty 2

## 2023-07-24 MED ORDER — OXYCODONE HCL 5 MG PO TABS
5.0000 mg | ORAL_TABLET | Freq: Four times a day (QID) | ORAL | Status: DC
  Administered 2023-07-24 – 2023-07-26 (×8): 5 mg
  Filled 2023-07-24 (×8): qty 1

## 2023-07-24 NOTE — Procedures (Signed)
 Bronchoscopy Procedure Note  MARKANTHONY GEDNEY  [id number]  04-19-1986  Date:07/24/23  Time:1:12 PM   Provider Performing:Parthiv Mucci C Katrinka Blazing   Procedure(s):  Subsequent Therapeutic Aspiration of Tracheobronchial Tree [phone])  Indication(s) Mucus plugging  Consent Verbal at bedside  Anesthesia In place for mechanical ventilation   Time Out Verified patient identification, verified procedure, site/side was marked, verified correct patient position, special equipment/implants available, medications/allergies/relevant history reviewed, required imaging and test results available.   Sterile Technique Usual hand hygiene, masks, gowns, and gloves were used   Procedure Description Bronchoscope advanced through ETT; copious mucopurulent secretions (Different from 07/20/23 appearance) suctioned from trachea and left lower/upper lobes.  Some more clear plugs found at R base  Otherwise everything is open.  No complications  Complications/Tolerance None; patient tolerated the procedure well. Chest X-ray is not needed post procedure.   EBL Minimal   Specimen(s) None

## 2023-07-24 NOTE — Progress Notes (Signed)
 Patient ID: Aaron [name], male   DOB: 11-06-1985, 38 y.o.   MRN: [id number]     Advanced Heart Failure Rounding Note  Cardiologist: Christell Constant, MD  Chief Complaint: V-A ECMO  Subjective:    Admitted 2/26 with worsening shortness of breath, chest pain, atrial fibrillation with RVR. Decompensated 2/27 with IVF, nodal blockade with subsequent respiratory arrest, ROSC achieved after ~16 minute down time Femoral VA ecmo cannulation 2/27 with Impella CP vent ECMO decannulation 3/8 Impella removed and extubated 3/12.  Had to be reintubated with mucus plugging and left lung opacification.   CXR this morning with LUL opacification. He remains on vent.   Patient is on milrinone 0.125.  He is off pressors and NO.  MAP stable.  CVVH ongoing, 50 cc/hr net negative UF with I/Os even. CVP 8-9 today, co-ox 77.5%.  Amiodarone gtt 60 mg/hr + mexiletine, remains in NSR this morning.   Afebrile with WBCs 26 => 30 => 42 => 47.  He is on vancomycin/meropenem/micafungin.   Echo (3/10): EF 35-40%, anterior and septal HK, normal RV, s/p Mitraclip with mild MR and mean gradient 7 mmHg.   Objective:   Weight Range: 87.4 kg Body mass index is 23.45 kg/m.   Vital Signs:   Temp:  [98 F (36.7 C)-100.2 F (37.9 C)] 98.7 F (37.1 C) (03/13 0350) Pulse Rate:  [62-104] 75 (03/13 0600) Resp:  [3-36] 16 (03/13 0600) BP: (130-161)/(54-67) 130/54 (03/13 0314) SpO2:  [86 %-100 %] 96 % (03/13 0600) Arterial Line BP: (95-225)/(49-89) 135/55 (03/13 0600) FiO2 (%):  [45 %-100 %] 70 % (03/13 0525) Last BM Date : 07/23/23  Weight change: Filed Weights   07/21/23 0500 07/22/23 0500 07/23/23 0500  Weight: 90.8 kg 90.2 kg 87.4 kg    Intake/Output:   Intake/Output Summary (Last 24 hours) at 07/24/2023 0707 Last data filed at 07/24/2023 0600 Gross per 24 hour  Intake 2596.56 ml  Output 2381 ml  Net 215.56 ml      Physical Exam    General: Intubated Neck: JVP 8, no thyromegaly or thyroid nodule.   Lungs: Decreased BS on left CV: Nondisplaced PMI.  Heart regular S1/S2, no S3/S4, no murmur.  No peripheral edema.   Abdomen: Soft, nontender, no hepatosplenomegaly, no distention.  Skin: Intact without lesions or rashes.  Neurologic: Sedated on vent. Extremities: No clubbing or cyanosis.  HEENT: Normal.   Telemetry   NSR 80s, no VT (personally reviewed)  Labs    CBC Recent Labs    07/23/23 0414 07/23/23 0415 07/24/23 0432 07/24/23 0442  WBC 41.8*  --  46.9*  --   HGB 9.0*   < > 8.5* 9.2*  HCT 27.9*   < > 26.3* 27.0*  MCV 90.6  --  89.5  --   PLT 229  --  283  --    < > = values in this interval not displayed.   Basic Metabolic Panel Recent Labs    78/46/96 0414 07/23/23 0415 07/23/23 1549 07/23/23 1608 07/24/23 0412 07/24/23 0442  NA 133*  132*   < > 130*   < > 130* 131*  K 5.3*  5.2*   < > 5.2*   < > 5.5* 5.5*  CL 98  97*  --  96*  --  98  --   CO2 21*  21*  --  22  --  21*  --   GLUCOSE 117*  116*  --  145*  --  133*  --  BUN 54*  54*  --  56*  --  56*  --   CREATININE 3.15*  2.99*  --  3.35*  --  3.60*  --   CALCIUM 8.7*  9.0  --  8.6*  --  8.3*  --   MG 2.6*  --   --   --  2.5*  --   PHOS 3.9  --  3.9  --  4.5  --    < > = values in this interval not displayed.   Liver Function Tests Recent Labs    07/23/23 0414 07/23/23 1549 07/24/23 0412  AST 59*  --  61*  ALT 24  --  25  ALKPHOS 174*  --  147*  BILITOT 1.4*  --  1.5*  PROT 8.0  --  7.6  ALBUMIN 2.4*  2.4* 2.4* 2.2*  2.2*    BNP: BNP (last 3 results) Recent Labs    07/09/23 1934 07/10/23 1224  BNP 703.7* 980.5*   Thyroid Function Tests No results for input(s): "TSH", "T4TOTAL", "T3FREE", "THYROIDAB" in the last 72 hours.  Invalid input(s): "FREET3"    Medications:     Scheduled Medications:  acetaminophen  650 mg Per Tube Q6H   amiodarone  150 mg Intravenous Once   aspirin  81 mg Oral Daily   Chlorhexidine Gluconate Cloth  6 each Topical Daily   clonazePAM  1 mg  Oral BID   feeding supplement (PROSource TF20)  60 mL Per Tube BID   fentaNYL (SUBLIMAZE) injection  50 mcg Intravenous Once   fiber supplement (BANATROL TF)  60 mL Per Tube BID   folic acid  1 mg Oral Daily   insulin aspart  0-20 Units Subcutaneous Q4H   insulin aspart  5 Units Subcutaneous Q4H   methimazole  10 mg Oral Q8H   mexiletine  250 mg Per Tube Q12H   multivitamin  1 tablet Oral QHS   mupirocin ointment  1 Application Nasal BID   mouth rinse  15 mL Mouth Rinse Q2H   oxyCODONE  5 mg Oral Q6H   pantoprazole (PROTONIX) IV  40 mg Intravenous QHS   sodium chloride flush  3 mL Intravenous Q12H   thiamine  100 mg Oral Daily   Or   thiamine  100 mg Intravenous Daily    Infusions:  amiodarone 60 mg/hr (07/24/23 0600)   clevidipine Stopped (07/24/23 0540)   dexmedetomidine (PRECEDEX) IV infusion 0.4 mcg/kg/hr (07/24/23 0600)   feeding supplement (PIVOT 1.5 CAL) Stopped (07/23/23 0001)   heparin 10,000 units/ 20 mL infusion syringe 500 Units/hr (07/23/23 1201)   meropenem (MERREM) IV 200 mL/hr at 07/24/23 0600   micafungin (MYCAMINE) 200 mg in sodium chloride 0.9 % 100 mL IVPB Stopped (07/22/23 0900)   midazolam-sodium chloride     milrinone 0.125 mcg/kg/min (07/24/23 0600)   norepinephrine (LEVOPHED) Adult infusion 0 mcg/min (07/24/23 0510)   PrismaSol BGK 2/3.5 400 mL/hr at 07/24/23 0306   PrismaSol BGK 2/3.5 400 mL/hr at 07/24/23 0305   PrismaSol BGK 2/3.5 1,500 mL/hr at 07/24/23 0626   propofol     propofol     propofol (DIPRIVAN) infusion 50 mcg/kg/min (07/24/23 0600)   Vancomycin (VANCOCIN) 1,250 mg in sodium chloride 0.9 % 250 mL IVPB Stopped (07/23/23 1538)   vasopressin Stopped (07/22/23 1246)    PRN Medications: docusate, heparin, HYDROmorphone, midazolam, midazolam-sodium chloride, mouth rinse, polyethylene glycol, propofol, propofol    Patient Profile   Patient with a history of Grave's disease, severe primary  mitral regurgitation who presented with chest  pain and segmental PE. Subsequent progressed to SCAI Stage E shock requiring VA ECMO cannulation on 2/27.   Assessment/Plan   SCAI Stage E Cardiogenic shock - Suspect hypoxic respiratory failure driven with segmental PE on top of severe MR, nodal blockage, and thyrotoxicosis - Down time ~ 20 minutes, good mental status confirmed post code - We performed a mini-turn down on 07/15/23 at bedside; ECMO flows were reduced in a stepwise pattern by 0.5LPM to a goal of 2.5LPM. During this time, impella 5.5 flows increased to maintain net even flows. With reduction of ECMO flows, patient had onset of frequent PVCs/NSVT requiring amio boluses. In addition, despite increased LV unloading patient continued to have severe 4+ MR with progressive RV failure on bedside TTE.  - Underwent successful mTEER on 07/16/23 with placement of x3 clips and improvement in MR from severe to mild. Mean mitral gradient of 3-49mmHg.  - Decannulated from V-A ECMO by Dr. Karin Lieu on 07/19/23 with vascular cut down and primary repair of left femoral artery.  - Echo on 3/10 showed EF 35-40%, anterior and septal HK, normal RV, s/p Mitraclip with mild MR and mean gradient 7 mmHg.  - Impella removed 3/12.  - CVVH ongoing, net negative 50 cc/hr.  CVP 8-9 today. Continue UF today via CVVH at 50 cc/hr net negative.   - He is on milrinone 0.125, off pressors and NO.  Co-ox 77.5%.  Can stop milrinone today.     Severe mitral valve regurgitation - Noted on previous echocardiogram - Now s/p mTEER on 07/16/23; improvement in MR from severe to mild.  - No murmur on exam  Pulmonary hypertension - PA pressures reached 80s/30s with PCWP of ~10, TPG of ~32mmHg suggestive of PVR >5 on 07/17/22 - He is now off NO, swan is out. Overall observational data is not supportive with the use of sildenafil in fixed PH secondary to MS/MR.   VT/NSVT - Frequent runs of NSVT likely secondary to impella 5.5 position. Impella now out.  - Now on mexiletine + amiodarone  60mg /hr - No further VT, decrease amiodarone to 30 mg/hr.     ID - Still on vancomycin/meropenem - Started on micafungin on 07/19/23 - WBCs 22.8 => 26 => 30 => 42 => 47, afebrile.  - Bronchoscopies with thick brown secretions but no culture data.  For now, continue current regimen.   Anemia - Hgb 8.5 today, transfuse hgb < 7.5.   Acute renal failure/hyperkalemia - CVVH ongoing, continue net negative UF aim for even to -50 cc/hr today with CVP 8-9.   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   Atrial fibrillation - Present on arrival, in the setting of PE and thyroid disease - sinus rhythm today.  - On amiodarone.   Pulmonary embolism:  - Segmental without right heart strain - Restart heparin gtt after Impella removed.    CAD - Prior embolic infarct in the LAD territory with corresponding LGE on CMR - Lifelong anticoagulation - On heparin gtt here.   Acute hypoxemic respiratory failure - Extubated and reintubated 3/12.   - Thick secretions with complete opacification of left lung, now opacified LUL after bronchoscopy.  - Vent per CCM.    CRITICAL CARE Performed by: Marca Ancona  Total critical care time: 45 minutes  Critical care time was exclusive of separately billable procedures  and treating other patients.  Critical care was necessary to treat or prevent imminent or life-threatening deterioration.  Critical care was time spent personally by me on the following activities: development of treatment plan with patient and/or surrogate as well as nursing, discussions with consultants, evaluation of patient's response to treatment, examination of patient, obtaining history from patient or surrogate, ordering and performing treatments and interventions, ordering and review of laboratory studies, ordering and review of radiographic studies, pulse  oximetry and re-evaluation of patient's condition.   Length of Stay: 15  Marca Ancona, MD  07/24/2023, 7:07 AM  Advanced Heart Failure Team Pager [phone] (M-F; 7a - 5p)  Please contact [facility] for night-coverage after hours (5p -7a ) and weekends on [email or web address]

## 2023-07-24 NOTE — Progress Notes (Addendum)
 Rt assisted with bronch at the bedside with MD without any complications with pt. On 100% O2. VS are stable at this time.

## 2023-07-24 NOTE — Progress Notes (Signed)
 PHARMACY - ANTICOAGULATION CONSULT NOTE  Pharmacy Consult for Heparin  Indication: pulmonary embolus, s/p ECMO decannulation 3/7, Impella removal 3/12  No Known Allergies  Patient Measurements: Height: 6\' 4"  (193 cm) Weight: 87.4 kg (192 lb 10.9 oz) IBW/kg (Calculated) : 86.8 Heparin Dosing Weight: n/a  Vital Signs: Temp: 98.5 F (36.9 C) (03/13 1600) Temp Source: Axillary (03/13 1600) BP: 97/50 (03/13 1600) Pulse Rate: 64 (03/13 1645)  Labs: Recent Labs    07/22/23 0412 07/22/23 0415 07/22/23 1636 07/22/23 1649 07/22/23 2308 07/23/23 0414 07/23/23 0415 07/23/23 1549 07/23/23 1608 07/23/23 2312 07/24/23 0412 07/24/23 0432 07/24/23 0442 07/24/23 1542  HGB 8.2*   < >  --    < >  --  9.0*   < >  --    < > 9.9*  --  8.5* 9.2*  --   HCT 25.3*   < >  --    < >  --  27.9*   < >  --    < > 29.0*  --  26.3* 27.0*  --   PLT 215  --   --   --   --  229  --   --   --   --   --  283  --   --   APTT 124*  --   --   --   --  143*  --   --   --   --  44*  --   --   --   HEPARINUNFRC 0.31  --  0.35  --   --   --   --   --   --   --   --   --   --  0.16*  CREATININE 3.29*  3.32*   < > 3.13*  --   --  3.15*  2.99*  --  3.35*  --   --  3.60*  --   --  4.17*  TROPONINIHS  --   --   --   --  30*  --   --   --   --   --   --   --   --   --    < > = values in this interval not displayed.    Estimated Creatinine Clearance: 29.5 mL/min (A) (by C-G formula based on SCr of 4.17 mg/dL (H)).   Medical History: Past Medical History:  Diagnosis Date   Atrial fibrillation (HCC)    Hyperthyroidism    Mitral regurgitation    NSTEMI (non-ST elevated myocardial infarction) [facility])     Assessment: 38 yo M with bilateral PE s/p TNK (2/27 @1156 ) now on [place] ECMO + Impella. Pharmacy consulted for bivalirudin for anticoagulation.   S/p impella CP >5.5 3/3 - bivalirudin was previously held with oozing at insertion site but restart 3/4. Patient underwent mitraclip 3/5.  Bival transitioned to heparin  3/7 after decannulation.  Impella 5.5 removed 3/13.  Heparin to restart this morning s/p Impella removal 3/12 PM.   Of note, also receiving heparin in CRRT circuit at 500 units/hr given has been clotting off CRRT filter. Hgb 8.5, plt 283. Fibrinogen 717, LDH 665.   Heparin level this evening came back at 0.16, on heparin infusion at 1850 units/hr. No s/sx of bleeding or infusion issues. Heparin running in central line and level drawn from A-line.   Goal of Therapy:  Heparin level 0.3-0.7 units/mL Monitor platelets by anticoagulation protocol: Yes   Plan:  Increase heparin to 1950  units/hr (continue 500 units/hr in CRRT circuit) 6 hours anti-Xa level Daily anti-Xa and CBC, monitor s/sx bleeding   Thank you for allowing pharmacy to participate in this patient's care,  Sherron Monday, PharmD, BCCCP Clinical Pharmacist  Phone: [phone] 07/24/2023 5:23 PM  Please check AMION for all [facility] Pharmacy phone numbers After 10:00 PM, call Main Pharmacy [phone]

## 2023-07-24 NOTE — Procedures (Signed)
 Bronchoscopy Procedure Note  Aaron [name]  [id number]  Jul 12, 1985  Date:07/24/23  Time:2:18 AM   Provider Performing:Shada Nienaber Sherryll Burger   Procedure(s):  Flexible bronchoscopy with bronchial alveolar lavage [id number])  Indication(s) Left lung complete Atelectasis/Mucous Plugging   Consent Risks of the procedure as well as the alternatives and risks of each were explained to the patient's fiancee who was at bedside.  Verbal Consent for the procedure was obtained.  Anesthesia Patient was intubated for procedure, see Intubation note Patient on Propofol and Precedex post intubation   Time Out Verified patient identification, verified procedure, site/side was marked, verified correct patient position, special equipment/implants available, medications/allergies/relevant history reviewed, required imaging and test results available.   Sterile Technique Usual hand hygiene, masks, gowns, and gloves were used   Procedure Description Bronchoscope advanced through endotracheal tube and into airway.  Airways were examined down to subsegmental level with findings noted below.   Following diagnostic evaluation, BAL(s) performed in LUL with normal saline and return of thick creamy and thin frothy  fluid  Findings:  The Bronchoscope was advanced into ETT and advanced to carina which was sharp.  The right side was first entered and copious secretions were noted on right side.  RUL/RML and RLLs were all suctioned and had thick creamy and thin clear tenacious secretions.  There was also old blood suctioned from RML/RLL.  There were no endobronchial lesions and the right side was anatomically correct. The left lung was then entered and again copious secretions on left side, creamy as well as clear tenacious.  These were suctioned from all lobes.  A BAL was done in the LUL and specimen sent for cultures.  The left side was anatomically correct and there were no endobronchial lesions.  All bronchi re inspected  prior to terminating the procedure they were patent.   Complications/Tolerance None; patient tolerated the procedure well. Chest X-ray is needed post procedure.   EBL Minimal   Specimen(s) LUL BAL

## 2023-07-24 NOTE — Progress Notes (Signed)
 PHARMACY - ANTICOAGULATION CONSULT NOTE  Pharmacy Consult for Heparin  Indication: pulmonary embolus, s/p ECMO decannulation 3/7, Impella removal 3/12  No Known Allergies  Patient Measurements: Height: 6\' 4"  (193 cm) Weight: 87.4 kg (192 lb 10.9 oz) IBW/kg (Calculated) : 86.8 Heparin Dosing Weight: n/a  Vital Signs: Temp: 98.7 F (37.1 C) (03/13 0350) Temp Source: Axillary (03/13 0350) BP: 130/54 (03/13 0314) Pulse Rate: 75 (03/13 0600)  Labs: Recent Labs    07/21/23 2249 07/22/23 0412 07/22/23 0415 07/22/23 1636 07/22/23 1649 07/22/23 2308 07/23/23 0414 07/23/23 0415 07/23/23 1549 07/23/23 1608 07/23/23 2312 07/24/23 0412 07/24/23 0432 07/24/23 0442  HGB  --  8.2*   < >  --    < >  --  9.0*   < >  --    < > 9.9*  --  8.5* 9.2*  HCT  --  25.3*   < >  --    < >  --  27.9*   < >  --    < > 29.0*  --  26.3* 27.0*  PLT  --  215  --   --   --   --  229  --   --   --   --   --  283  --   APTT  --  124*  --   --   --   --  143*  --   --   --   --  44*  --   --   HEPARINUNFRC 0.32 0.31  --  0.35  --   --   --   --   --   --   --   --   --   --   CREATININE  --  3.29*  3.32*   < > 3.13*  --   --  3.15*  2.99*  --  3.35*  --   --  3.60*  --   --   TROPONINIHS  --   --   --   --   --  30*  --   --   --   --   --   --   --   --    < > = values in this interval not displayed.    Estimated Creatinine Clearance: 34.2 mL/min (A) (by C-G formula based on SCr of 3.6 mg/dL (H)).   Medical History: Past Medical History:  Diagnosis Date   Atrial fibrillation [facility])    Hyperthyroidism    Mitral regurgitation    NSTEMI (non-ST elevated myocardial infarction) [facility])     Assessment: 38 yo M with bilateral PE s/p TNK (2/27 @1156 ) now on [place] ECMO + Impella. Pharmacy consulted for bivalirudin for anticoagulation.   S/p impella CP >5.5 3/3 - bivalirudin was previously held with oozing at insertion site but restart 3/4. Patient underwent mitraclip 3/5.  Bival transitioned to heparin  3/7 after decannulation.  Impella 5.5 removed 3/13.  Heparin to restart this morning s/p Impella removal 3/12 PM.   Of note, also receiving heparin in CRRT circuit at 500 units/hr given has been clotting off CRRT filter. Hgb 8.5, plt 283. Fibrinogen 717, LDH 665. No s/sx of bleeding.   Goal of Therapy:  Heparin level 0.3-0.7 units/mL Monitor platelets by anticoagulation protocol: Yes   Plan:  START heparin at 1850 units/hr (continue 500 units/hr in CRRT circuit) 6 hours anti-Xa level Daily anti-Xa and CBC, monitor s/sx bleeding   Trixie Rude, PharmD Clinical Pharmacist 07/24/2023  1:37 PM

## 2023-07-24 NOTE — Progress Notes (Signed)
 1 Day Post-Op Procedure(s) (LRB): REMOVAL, CARDIAC ASSIST DEVICE, IMPELLA (N/A) ECHOCARDIOGRAM, TRANSESOPHAGEAL, INTRAOPERATIVE (N/A) Subjective: intubated  Objective: Vital signs in last 24 hours: Temp:  [98 F (36.7 C)-100.2 F (37.9 C)] 98.7 F (37.1 C) (03/13 0350) Pulse Rate:  [62-104] 78 (03/13 0700) Cardiac Rhythm: Normal sinus rhythm (03/13 0400) Resp:  [3-36] 18 (03/13 0700) BP: (130-161)/(54-67) 130/54 (03/13 0314) SpO2:  [86 %-100 %] 99 % (03/13 0700) Arterial Line BP: (96-225)/(49-89) 134/54 (03/13 0700) FiO2 (%):  [45 %-100 %] 60 % (03/13 0747)  Hemodynamic parameters for last 24 hours: PAP: (8-54)/(0-33) 8/0 CVP:  [0 mmHg-13 mmHg] 5 mmHg CO:  [8.3 L/min] 8.3 L/min CI:  [3.6 L/min/m2] 3.6 L/min/m2  Intake/Output from previous day: 03/12 0701 - 03/13 0700 In: 2705.9 [I.V.:1922.2; NG/GT:180; IV Piggyback:553.2] Out: 2521 [Stool:35] Intake/Output this shift: No intake/output data recorded.  Wound: dressing clean and dry Good pulse right UE  Lab Results: Recent Labs    07/23/23 0414 07/23/23 0415 07/24/23 0432 07/24/23 0442  WBC 41.8*  --  46.9*  --   HGB 9.0*   < > 8.5* 9.2*  HCT 27.9*   < > 26.3* 27.0*  PLT 229  --  283  --    < > = values in this interval not displayed.   BMET:  Recent Labs    07/23/23 1549 07/23/23 1608 07/24/23 0412 07/24/23 0442  NA 130*   < > 130* 131*  K 5.2*   < > 5.5* 5.5*  CL 96*  --  98  --   CO2 22  --  21*  --   GLUCOSE 145*  --  133*  --   BUN 56*  --  56*  --   CREATININE 3.35*  --  3.60*  --   CALCIUM 8.6*  --  8.3*  --    < > = values in this interval not displayed.    PT/INR: No results for input(s): "LABPROT", "INR" in the last 72 hours. ABG    Component Value Date/Time   PHART 7.383 07/24/2023 0442   HCO3 23.3 07/24/2023 0442   TCO2 25 07/24/2023 0442   ACIDBASEDEF 2.0 07/24/2023 0442   O2SAT 100 07/24/2023 0442   CBG (last 3)  Recent Labs    07/23/23 2011 07/23/23 2334 07/24/23 0352   GLUCAP 165* 164* 114*    Assessment/Plan: S/P Procedure(s) (LRB): REMOVAL, CARDIAC ASSIST DEVICE, IMPELLA (N/A) ECHOCARDIOGRAM, TRANSESOPHAGEAL, INTRAOPERATIVE (N/A) POD # 1 Impella 5.5 removal Extubated briefly yesterday but reintubated overnight for mucous plugging Co-ox 77 without Impella Good pulse in right arm   LOS: 15 days    Aaron [name] 07/24/2023

## 2023-07-24 NOTE — Progress Notes (Signed)
 NAME:  Aaron [name], MRN:  [id number], DOB:  06-Mar-1986, LOS: 15 ADMISSION DATE:  07/09/2023, CONSULTATION DATE:  07/11/2023 REFERRING MD: Clearnce Hasten, CHIEF COMPLAINT: Status post cardiac arrest  History of Present Illness:  37 year old male with hypothyroidism, paroxysmal A-fib, severe mitral regurgitation who presented to [facility] long hospital with chest pain and shortness of breath for few days associated cough, nausea and diarrhea.  In the emergency department he was diagnosed with PE, was started on IV heparin.  He takes beta-blocker, steroid and methimazole for hyperthyroidism.  On 2/27 patient rapidly deteriorated developed V. tach/V-fib cardiac arrest patient was intubated after 40 minutes of CPR, cardiology was consulted patient was placed on VA ECMO and was transferred to [facility]  Pertinent  Medical History   Past Medical History:  Diagnosis Date   Atrial fibrillation [facility])    Hyperthyroidism    Mitral regurgitation    NSTEMI (non-ST elevated myocardial infarction) (HCC)     Significant Hospital Events: Including procedures, antibiotic start and stop dates in addition to other pertinent events   3/5 salvage TEER (transcatheter edge to edge mitral valve repair 3/7 weaned off [facility] 3/8 bronch for mucus plugging 3/12 impella removed, extubation trial 3/13 reintubated, bronch for recurrent plugging  Interim History / Subjective:  Events from early this am noted.  Objective   Blood pressure (!) 130/54, pulse 75, temperature 98.7 F (37.1 C), temperature source Axillary, resp. rate 16, height 6\' 4"  (1.93 m), weight 87.4 kg, SpO2 96%. PAP: (8-54)/(0-33) 8/0 CVP:  [0 mmHg-13 mmHg] 5 mmHg CO:  [8.3 L/min] 8.3 L/min CI:  [3.6 L/min/m2] 3.6 L/min/m2  Vent Mode: PRVC FiO2 (%):  [45 %-100 %] 70 % Set Rate:  [18 bmp-20 bmp] 20 bmp Vt Set:  [580 mL-610 mL] 580 mL PEEP:  [5 cmH20-8 cmH20] 8 cmH20 Pressure Support:  [5 cmH20] 5 cmH20 Plateau Pressure:  [7 cmH20-25  cmH20] 25 cmH20   Intake/Output Summary (Last 24 hours) at 07/24/2023 4098 Last data filed at 07/24/2023 0600 Gross per 24 hour  Intake 2596.56 ml  Output 2381 ml  Net 215.56 ml   Filed Weights   07/21/23 0500 07/22/23 0500 07/23/23 0500  Weight: 90.8 kg 90.2 kg 87.4 kg    Examination: No distress Rhonci and reduced breath sounds left Ext warm Abd soft Rectal tube in place Opened eyes overnight but did not follow commands  ABG fine Labs including LDH okay  Resolved Hospital Problem list   Lactic acidosis, resolved Refractory hyperkalemia, improving  Assessment & Plan:  Baseline severe mitral insufficiency with superimposed RV>LV shock state culminating in prolonged IHCA s/p VA ECMO 2/27- salvage TEER 3/5, weaned from [place] to impella 3/7.  Post arrest encephalopathy- did not follow commands post extubation; some reports that he did during ecmo run Acute renal failure- on CRRT Thyrotoxicosis- on methimazole, stress steroids; stress steroids off on 3/8 Recurrent mucus plugging, resp failure- at baseline coughs up brown material (???) per fiance.  BAL 3/8 neg.  Failed extubation trial 3/12 due to recurrent plugging, BAL sent again. Rising white count- will blame on mucus plugging and decannulation reaction for today; if ongoing will need line holiday  Heparin resuming today TSH check tomorrow Continue antimicrobials as ordered Amio down and milrinone off Restart TF CRRT pull per nephro/cards discussion: ? Lower K bath MRI brain to assure no structural issues post arrest (now that has mechanical support out) Try to see if we can manage with fent/prop alone; benzos will linger F/u  BAL culture Probably needs redo therapeutic bronch Fiance updated at bedside  Best Practice (right click and "Reselect all SmartList Selections" daily)   Diet/type: NPO TF DVT prophylaxis heparin Pressure ulcer(s): sacrum GI prophylaxis: PPI Lines: Central line, Arterial Line, and yes and it is  still needed.   Foley:  Yes, and it is still needed Code Status:  full code Last date of multidisciplinary goals of care discussion [Per primary team]  31 min cc time Myrla Halsted MD PCCM

## 2023-07-24 NOTE — Anesthesia Postprocedure Evaluation (Signed)
 Anesthesia Post Note  Patient: Aaron [name]  Procedure(s) Performed: REMOVAL, CARDIAC ASSIST DEVICE, IMPELLA (Chest) ECHOCARDIOGRAM, TRANSESOPHAGEAL, INTRAOPERATIVE     Patient location during evaluation: SICU Anesthesia Type: General Level of consciousness: sedated Pain management: pain level controlled Vital Signs Assessment: post-procedure vital signs reviewed and stable Respiratory status: patient remains intubated per anesthesia plan Cardiovascular status: stable Postop Assessment: no apparent nausea or vomiting Anesthetic complications: no   No notable events documented.  Last Vitals:  Vitals:   07/24/23 0545 07/24/23 0600  BP:    Pulse: 81 75  Resp: 20 16  Temp:    SpO2: 96% 96%    Last Pain:  Vitals:   07/24/23 0350  TempSrc: Axillary  PainSc:                  Charon Smedberg S

## 2023-07-24 NOTE — Procedures (Signed)
 Intubation Procedure Note  Aaron [name]  [id number]  1985/08/13  Date:07/24/23  Time:2:29 AM   Provider Performing:Jancie Kercher Sherryll Burger    Procedure: Intubation (31500)  Indication(s) Respiratory Failure and bronchoscopy I was asked by eLink to evaluate patient for Bronchoscopy as he had a period of desaturation and so a cxr was done and it showed complete atelectasis/mucous plugging of left lung.  The patient obtunded on 100% FIO2 BiPAP.  Consent Risks of the procedure as well as the alternatives and risks of each were explained to the  caregiver.  Verbal consent for the procedure was obtained    Anesthesia Etomidate, Versed, and Rocuronium   Time Out Verified patient identification, verified procedure, site/side was marked, verified correct patient position, special equipment/implants available, medications/allergies/relevant history reviewed, required imaging and test results available.   Sterile Technique Usual hand hygeine, masks, and gloves were used   Procedure Description Patient positioned in bed supine.  Sedation given as noted above.  Patient was intubated with endotracheal tube using Glidescope.  View was Grade 1 full glottis .  Number of attempts was 1.  Colorimetric CO2 detector was consistent with tracheal placement.   Complications/Tolerance None; patient tolerated the procedure well. Chest X-ray is ordered to verify placement.   EBL Minimal   Specimen(s) None

## 2023-07-24 NOTE — Progress Notes (Addendum)
 [facility] NEPHROLOGY PROGRESS NOTE  Assessment/ Plan: Pt is a 38 y.o. yo male  likely ATN after SCA, cardiogenic shock on ECMO and Impella, AHRF/VDRF   # Dialysis dependent anuric AKI 2/2 ATN from cardiogenic shock on CRRT since 07/11/23, Left subclavian temp HD cath placed by CCM;  Tolerating CRRT well however getting interruptions because of procedures, imaging studies etc.  The potassium level was running high therefore the CRRT prescription was changed to all 2K bath yesterday.  He is going to MRI this morning therefore CRRT on hold.  I will go ahead and change pre and post filter to 0 potassium and continue dialysate at 2K bath.  Monitor lab.  UF around 50-100 cc an hour.     # Cardiogenic Shock,  Impella removed; status post transcutaneous mitral valve repair; previously on ECMO. per cardiology.  # VT + PEA SCA, as per cardiology team. # PE on heparin per CCM # Anemia, Hb stable, per AHF # Hyperkalemia, less present after ECMO decannulation.  Existing CRRT. # Severe MR s/p TEER 3/5 # Hypophosphatemia due to CRRT: Replating IV phosphorus and repeating lab.  Subjective: Seen and examined and ICU.  Discussed with ICU nurse.  CRRT interrupted last night for few hours and this morning he is going to MRI therefore CRRT on hold.  Remains intubated and sedated.  No urine output. Objective Vital signs in last 24 hours: Vitals:   07/24/23 0545 07/24/23 0600 07/24/23 0700 07/24/23 0756  BP:      Pulse: 81 75 78   Resp: 20 16 18    Temp:    97.8 F (36.6 C)  TempSrc:    Axillary  SpO2: 96% 96% 99%   Weight:      Height:       Weight change:   Intake/Output Summary (Last 24 hours) at 07/24/2023 0914 Last data filed at 07/24/2023 0800 Gross per 24 hour  Intake 2578.28 ml  Output 2351 ml  Net 227.28 ml       Labs: RENAL PANEL Recent Labs  Lab 07/20/23 0501 07/20/23 1624 07/21/23 0418 07/21/23 1608 07/22/23 0412 07/22/23 0415 07/22/23 1511 07/22/23 1636  07/22/23 1649 07/23/23 0414 07/23/23 0415 07/23/23 1549 07/23/23 1608 07/23/23 2225 07/23/23 2312 07/24/23 0412 07/24/23 0442  NA 134*   < > 134*   < > 132*  132*   < > 128* 129*   < > 133*  132*   < > 130* 132* 130* 131* 130* 131*  K 3.6   < > 3.8   < > 4.0  4.0   < > 4.7 5.1   < > 5.3*  5.2*   < > 5.2* 5.4* 5.2* 5.2* 5.5* 5.5*  CL 100   < > 99   < > 99  100  --  98 96*  --  98  97*  --  96*  --   --   --  98  --   CO2 21*   < > 22   < > 22  21*  --  20* 20*  --  21*  21*  --  22  --   --   --  21*  --   GLUCOSE 135*   < > 121*   < > 133*  132*  --  160* 166*  --  117*  116*  --  145*  --   --   --  133*  --   BUN 81*   < >  58*   < > 54*  55*  --  53* 53*  --  54*  54*  --  56*  --   --   --  56*  --   CREATININE 4.88*   < > 3.59*   < > 3.29*  3.32*  --  3.05* 3.13*  --  3.15*  2.99*  --  3.35*  --   --   --  3.60*  --   CALCIUM 7.9*   < > 8.3*   < > 8.5*  8.4*  --  8.2* 8.5*  --  8.7*  9.0  --  8.6*  --   --   --  8.3*  --   MG 2.3  --  2.6*  --  2.6*  --   --   --   --  2.6*  --   --   --   --   --  2.5*  --   PHOS 3.6   < > 3.0   < > 1.8*  --  2.7  --   --  3.9  --  3.9  --   --   --  4.5  --   ALBUMIN 2.3*   < > 2.3*  2.3*   < > 2.1*  2.1*  --  2.4*  --   --  2.4*  2.4*  --  2.4*  --   --   --  2.2*  2.2*  --    < > = values in this interval not displayed.    Liver Function Tests: Recent Labs  Lab 07/22/23 0412 07/22/23 1511 07/23/23 0414 07/23/23 1549 07/24/23 0412  AST 37  --  59*  --  61*  ALT 16  --  24  --  25  ALKPHOS 135*  --  174*  --  147*  BILITOT 1.3*  --  1.4*  --  1.5*  PROT 6.8  --  8.0  --  7.6  ALBUMIN 2.1*  2.1*   < > 2.4*  2.4* 2.4* 2.2*  2.2*   < > = values in this interval not displayed.   No results for input(s): "LIPASE", "AMYLASE" in the last 168 hours. No results for input(s): "AMMONIA" in the last 168 hours. CBC: Recent Labs    07/23/23 0414 07/23/23 0415 07/23/23 1608 07/23/23 2225 07/23/23 2312 07/24/23 0432  07/24/23 0442  HGB 9.0*   < > 11.6* 9.9* 9.9* 8.5* 9.2*  MCV 90.6  --   --   --   --  89.5  --    < > = values in this interval not displayed.    Cardiac Enzymes: No results for input(s): "CKTOTAL", "CKMB", "CKMBINDEX", "TROPONINI" in the last 168 hours. CBG: Recent Labs  Lab 07/23/23 1558 07/23/23 2011 07/23/23 2334 07/24/23 0352 07/24/23 0755  GLUCAP 141* 165* 164* 114* 94    Iron Studies: No results for input(s): "IRON", "TIBC", "TRANSFERRIN", "FERRITIN" in the last 72 hours. Studies/Results: DG Abd 1 View Result Date: 07/24/2023 CLINICAL DATA:  5626 Acute respiratory failure [facility]) [id number] Encounter for feeding tube placement [id number] EXAM: ABDOMEN - 1 VIEW COMPARISON:  X-ray abdomen 07/17/2023, chest abdomen pelvis 07/10/2023 FINDINGS: Enteric tube with tip and side port overlying the gastric lumen. The bowel gas pattern is normal. No radio-opaque calculi or other significant radiographic abnormality are seen. IMPRESSION: Enteric tube in good position. Electronically Signed   By: Tish Frederickson M.D.   On: 07/24/2023 02:48  DG Chest Port 1 View Result Date: 07/24/2023 CLINICAL DATA:  Acute respiratory failure EXAM: PORTABLE CHEST 1 VIEW COMPARISON:  Chest x-ray 07/23/2023.  Chest CT 07/10/2023. FINDINGS: Endotracheal tube tip is 5.2 cm above the carina. Right-sided central venous catheter tip ends in the SVC. Left IJ catheter ends in the SVC. Left subclavian catheter ends in the level of the brachiocephalic vein, unchanged. Enteric tube extends below the diaphragm. The heart is enlarged, unchanged. There central pulmonary vascular congestion. There is improved aeration in the left lower lung. There is complete opacification of the upper right hemithorax similar to prior. There is a small layering left pleural effusion. Osseous structures are stable. IMPRESSION: 1. Improved aeration in the left lower lung. 2. Stable complete opacification of the upper right hemithorax. 3. Small  layering left pleural effusion. 4. Stable cardiomegaly and central pulmonary vascular congestion. Electronically Signed   By: Darliss Cheney M.D.   On: 07/24/2023 02:32   DG Chest Port 1 View Result Date: 07/24/2023 CLINICAL DATA:  Acute respiratory failure, hypoxia EXAM: PORTABLE CHEST 1 VIEW COMPARISON:  07/22/2023 FINDINGS: There is abrupt cut off of the left mainstem bronchus and there is now complete atelectasis of the left lung with marked mediastinal shift to the left and complete opacification of left hemithorax. The findings suggest a obstructing endobronchial lesion such as a mucous plug or aspirated foreign body. Left subclavian temporary hemodialysis catheter left internal jugular central venous catheter are unchanged in position though deviated to the left by mediastinal shift. Right upper extremity PICC line has been placed with its tip overlying the expected superior vena cava. Right lung is clear. No pneumothorax. No pleural effusion right. IMPRESSION: 1. Complete atelectasis of the left lung with marked mediastinal shift to the left and complete opacification of left hemithorax. The findings suggest a obstructing endobronchial lesion such as a mucous plug or aspirated foreign body. 2. Interval placement of right upper extremity PICC line, tip within the superior vena cava. Electronically Signed   By: Helyn Numbers M.D.   On: 07/24/2023 00:53   [place] EKG SITE RITE Result Date: 07/23/2023 If Site Rite image not attached, placement could not be confirmed due to current cardiac rhythm.   Medications: Infusions:  amiodarone 30 mg/hr (07/24/23 0800)   clevidipine Stopped (07/24/23 0640)   dexmedetomidine (PRECEDEX) IV infusion 0.4 mcg/kg/hr (07/24/23 0800)   feeding supplement (PIVOT 1.5 CAL) Stopped (07/23/23 0001)   fentaNYL infusion INTRAVENOUS     heparin 10,000 units/ 20 mL infusion syringe 500 Units/hr (07/24/23 0728)   heparin     meropenem (MERREM) IV Stopped (07/24/23 0610)    micafungin (MYCAMINE) 200 mg in sodium chloride 0.9 % 100 mL IVPB Stopped (07/22/23 0900)   midazolam-sodium chloride     norepinephrine (LEVOPHED) Adult infusion Stopped (07/24/23 0619)   PrismaSol BGK 2/3.5 400 mL/hr at 07/24/23 0306   PrismaSol BGK 2/3.5 400 mL/hr at 07/24/23 0305   PrismaSol BGK 2/3.5 1,500 mL/hr at 07/24/23 0626   propofol     propofol     propofol (DIPRIVAN) infusion 50 mcg/kg/min (07/24/23 0800)   Vancomycin (VANCOCIN) 1,250 mg in sodium chloride 0.9 % 250 mL IVPB Stopped (07/23/23 1538)   vasopressin Stopped (07/22/23 1246)    Scheduled Medications:  acetaminophen  650 mg Per Tube Q6H   aspirin  81 mg Per Tube Daily   Chlorhexidine Gluconate Cloth  6 each Topical Daily   clonazePAM  1 mg Per Tube BID   feeding supplement (PROSource TF20)  60 mL Per Tube BID   feeding supplement (VITAL HIGH PROTEIN)  1,000 mL Per Tube Q24H   fiber supplement (BANATROL TF)  60 mL Per Tube BID   folic acid  1 mg Per Tube Daily   insulin aspart  0-20 Units Subcutaneous Q4H   insulin aspart  5 Units Subcutaneous Q4H   methimazole  10 mg Per Tube Q8H   mexiletine  250 mg Per Tube Q12H   multivitamin  1 tablet Oral QHS   mupirocin ointment  1 Application Nasal BID   mouth rinse  15 mL Mouth Rinse Q2H   oxyCODONE  5 mg Per Tube Q6H   pantoprazole (PROTONIX) IV  40 mg Intravenous QHS   sodium chloride flush  3 mL Intravenous Q12H   thiamine  100 mg Per Tube Daily   Or   thiamine  100 mg Intravenous Daily    have reviewed scheduled and prn medications.  Physical Exam: General: Critically ill looking male, on CRRT. Heart:RRR, s1s2 nl Lungs: Intubated, sedated. Abdomen:soft, non-distended Extremities:No edema Dialysis Access: Left subclavian temporary HD line.  Aaron [name] Aaron [name] 07/24/2023,9:14 AM  LOS: 15 days

## 2023-07-24 NOTE — Progress Notes (Signed)
 PT Cancellation Note  Patient Details Name: Aaron [name] MRN: [id number] DOB: 10-May-1986   Cancelled Treatment:    Reason Eval/Treat Not Completed: Medical issues which prohibited therapy (Per RN, pt is not appropriate for PT eval today. Plan for brain MRI today. Acute PT to follow and re-attempt as appropriate.)  Hilton Cork, PT, DPT Secure Chat Preferred  Rehab Office [phone]  Arturo Morton Brion Aliment 07/24/2023, 8:28 AM

## 2023-07-24 NOTE — TOC Progression Note (Addendum)
 Transition of Care [facility]) - Progression Note    Patient Details  Name: Aaron [name] MRN: [id number] Date of Birth: May 02, 1986  Transition of Care [facility]) CM/SW Contact  Nicanor Bake Phone Number: [phone] 07/24/2023, 9:56 AM  Clinical Narrative:   HF CSW has made several attempts this morning to speak with someone in the [facility] [phone]  TO inquire if the patients FMLA paperwork was received. CSW will follow up in person.  FMLA paperwork received per RN, Eileen Stanford in the [facility].  TOC will continue following.     Expected Discharge Plan: IP Rehab Facility Barriers to Discharge: Continued Medical Work up  Expected Discharge Plan and Services   Discharge Planning Services: CM Consult   Living arrangements for the past 2 months: Apartment                                       Social Determinants of Health (SDOH) Interventions SDOH Screenings   Food Insecurity: No Food Insecurity (07/10/2023)  Housing: Low Risk  (07/10/2023)  Transportation Needs: No Transportation Needs (07/10/2023)  Utilities: Not At Risk (07/10/2023)  Financial Resource Strain: Medium Risk (04/01/2023)  Tobacco Use: Low Risk  (07/10/2023)  Health Literacy: Adequate Health Literacy (04/01/2023)    Readmission Risk Interventions    07/10/2023   10:12 AM  Readmission Risk Prevention Plan  Transportation Screening Complete  PCP or Specialist Appt within 5-7 Days Complete  Home Care Screening Complete  Medication Review (RN CM) Complete

## 2023-07-24 NOTE — Progress Notes (Signed)
 Patient transported from 2H22 to MRI and back to 2H22 with RT, RN and transport. Noc omplications noted, vitals stable.

## 2023-07-25 ENCOUNTER — Inpatient Hospital Stay (HOSPITAL_COMMUNITY)

## 2023-07-25 DIAGNOSIS — N170 Acute kidney failure with tubular necrosis: Secondary | ICD-10-CM | POA: Diagnosis not present

## 2023-07-25 DIAGNOSIS — R570 Cardiogenic shock: Secondary | ICD-10-CM | POA: Diagnosis not present

## 2023-07-25 DIAGNOSIS — R0989 Other specified symptoms and signs involving the circulatory and respiratory systems: Secondary | ICD-10-CM | POA: Diagnosis not present

## 2023-07-25 DIAGNOSIS — Z452 Encounter for adjustment and management of vascular access device: Secondary | ICD-10-CM | POA: Diagnosis not present

## 2023-07-25 DIAGNOSIS — I2699 Other pulmonary embolism without acute cor pulmonale: Secondary | ICD-10-CM | POA: Diagnosis not present

## 2023-07-25 DIAGNOSIS — R0602 Shortness of breath: Secondary | ICD-10-CM | POA: Diagnosis not present

## 2023-07-25 DIAGNOSIS — R079 Chest pain, unspecified: Secondary | ICD-10-CM | POA: Diagnosis not present

## 2023-07-25 DIAGNOSIS — Z4682 Encounter for fitting and adjustment of non-vascular catheter: Secondary | ICD-10-CM | POA: Diagnosis not present

## 2023-07-25 DIAGNOSIS — I469 Cardiac arrest, cause unspecified: Secondary | ICD-10-CM | POA: Diagnosis not present

## 2023-07-25 DIAGNOSIS — J9601 Acute respiratory failure with hypoxia: Secondary | ICD-10-CM | POA: Diagnosis not present

## 2023-07-25 DIAGNOSIS — E875 Hyperkalemia: Secondary | ICD-10-CM | POA: Diagnosis not present

## 2023-07-25 DIAGNOSIS — E872 Acidosis, unspecified: Secondary | ICD-10-CM | POA: Diagnosis not present

## 2023-07-25 LAB — POCT I-STAT 7, (LYTES, BLD GAS, ICA,H+H)
Acid-base deficit: 1 mmol/L (ref 0.0–2.0)
Acid-base deficit: 2 mmol/L (ref 0.0–2.0)
Acid-base deficit: 3 mmol/L — ABNORMAL HIGH (ref 0.0–2.0)
Bicarbonate: 24.3 mmol/L (ref 20.0–28.0)
Bicarbonate: 23.1 mmol/L (ref 20.0–28.0)
Bicarbonate: 23.5 mmol/L (ref 20.0–28.0)
Calcium, Ion: 1.11 mmol/L — ABNORMAL LOW (ref 1.15–1.40)
Calcium, Ion: 1.13 mmol/L — ABNORMAL LOW (ref 1.15–1.40)
Calcium, Ion: 1.13 mmol/L — ABNORMAL LOW (ref 1.15–1.40)
HCT: 23 % — ABNORMAL LOW (ref 39.0–52.0)
HCT: 25 % — ABNORMAL LOW (ref 39.0–52.0)
HCT: 28 % — ABNORMAL LOW (ref 39.0–52.0)
Hemoglobin: 7.8 g/dL — ABNORMAL LOW (ref 13.0–17.0)
Hemoglobin: 8.5 g/dL — ABNORMAL LOW (ref 13.0–17.0)
Hemoglobin: 9.5 g/dL — ABNORMAL LOW (ref 13.0–17.0)
O2 Saturation: 99 %
O2 Saturation: 98 %
O2 Saturation: 99 %
Patient temperature: 98.6
Patient temperature: 98.5
Patient temperature: 99.8
Potassium: 3.8 mmol/L (ref 3.5–5.1)
Potassium: 4.1 mmol/L (ref 3.5–5.1)
Potassium: 4.1 mmol/L (ref 3.5–5.1)
Sodium: 133 mmol/L — ABNORMAL LOW (ref 135–145)
Sodium: 133 mmol/L — ABNORMAL LOW (ref 135–145)
Sodium: 133 mmol/L — ABNORMAL LOW (ref 135–145)
TCO2: 26 mmol/L (ref 22–32)
TCO2: 24 mmol/L (ref 22–32)
TCO2: 25 mmol/L (ref 22–32)
pCO2 arterial: 43.7 mmHg (ref 32–48)
pCO2 arterial: 43.1 mmHg (ref 32–48)
pCO2 arterial: 44.5 mmHg (ref 32–48)
pH, Arterial: 7.353 (ref 7.35–7.45)
pH, Arterial: 7.327 — ABNORMAL LOW (ref 7.35–7.45)
pH, Arterial: 7.344 — ABNORMAL LOW (ref 7.35–7.45)
pO2, Arterial: 166 mmHg — ABNORMAL HIGH (ref 83–108)
pO2, Arterial: 125 mmHg — ABNORMAL HIGH (ref 83–108)
pO2, Arterial: 139 mmHg — ABNORMAL HIGH (ref 83–108)

## 2023-07-25 LAB — GLUCOSE, CAPILLARY
Glucose-Capillary: 131 mg/dL — ABNORMAL HIGH (ref 70–99)
Glucose-Capillary: 133 mg/dL — ABNORMAL HIGH (ref 70–99)
Glucose-Capillary: 141 mg/dL — ABNORMAL HIGH (ref 70–99)
Glucose-Capillary: 148 mg/dL — ABNORMAL HIGH (ref 70–99)
Glucose-Capillary: 56 mg/dL — ABNORMAL LOW (ref 70–99)
Glucose-Capillary: 86 mg/dL (ref 70–99)
Glucose-Capillary: 97 mg/dL (ref 70–99)
Glucose-Capillary: 98 mg/dL (ref 70–99)

## 2023-07-25 LAB — PHOSPHORUS: Phosphorus: 4.1 mg/dL (ref 2.5–4.6)

## 2023-07-25 LAB — RENAL FUNCTION PANEL
Albumin: 1.9 g/dL — ABNORMAL LOW (ref 3.5–5.0)
Albumin: 2.2 g/dL — ABNORMAL LOW (ref 3.5–5.0)
Anion gap: 10 (ref 5–15)
Anion gap: 10 (ref 5–15)
BUN: 51 mg/dL — ABNORMAL HIGH (ref 6–20)
BUN: 49 mg/dL — ABNORMAL HIGH (ref 6–20)
CO2: 21 mmol/L — ABNORMAL LOW (ref 22–32)
CO2: 23 mmol/L (ref 22–32)
Calcium: 7.7 mg/dL — ABNORMAL LOW (ref 8.9–10.3)
Calcium: 8 mg/dL — ABNORMAL LOW (ref 8.9–10.3)
Chloride: 100 mmol/L (ref 98–111)
Chloride: 100 mmol/L (ref 98–111)
Creatinine, Ser: 3.29 mg/dL — ABNORMAL HIGH (ref 0.61–1.24)
Creatinine, Ser: 3.33 mg/dL — ABNORMAL HIGH (ref 0.61–1.24)
GFR, Estimated: 24 mL/min — ABNORMAL LOW (ref >60)
GFR, Estimated: 23 mL/min — ABNORMAL LOW (ref >60)
Glucose, Bld: 121 mg/dL — ABNORMAL HIGH (ref 70–99)
Glucose, Bld: 135 mg/dL — ABNORMAL HIGH (ref 70–99)
Phosphorus: 4 mg/dL (ref 2.5–4.6)
Phosphorus: 3.9 mg/dL (ref 2.5–4.6)
Potassium: 3.5 mmol/L (ref 3.5–5.1)
Potassium: 4 mmol/L (ref 3.5–5.1)
Sodium: 131 mmol/L — ABNORMAL LOW (ref 135–145)
Sodium: 133 mmol/L — ABNORMAL LOW (ref 135–145)

## 2023-07-25 LAB — BASIC METABOLIC PANEL
Anion gap: 13 (ref 5–15)
BUN: 54 mg/dL — ABNORMAL HIGH (ref 6–20)
CO2: 20 mmol/L — ABNORMAL LOW (ref 22–32)
Calcium: 8 mg/dL — ABNORMAL LOW (ref 8.9–10.3)
Chloride: 97 mmol/L — ABNORMAL LOW (ref 98–111)
Creatinine, Ser: 3.39 mg/dL — ABNORMAL HIGH (ref 0.61–1.24)
GFR, Estimated: 23 mL/min — ABNORMAL LOW (ref >60)
Glucose, Bld: 112 mg/dL — ABNORMAL HIGH (ref 70–99)
Potassium: 3.8 mmol/L (ref 3.5–5.1)
Sodium: 130 mmol/L — ABNORMAL LOW (ref 135–145)

## 2023-07-25 LAB — POTASSIUM: Potassium: 3.7 mmol/L (ref 3.5–5.1)

## 2023-07-25 LAB — MAGNESIUM: Magnesium: 2.3 mg/dL (ref 1.7–2.4)

## 2023-07-25 LAB — COOXEMETRY PANEL
Carboxyhemoglobin: 1.8 % — ABNORMAL HIGH (ref 0.5–1.5)
Carboxyhemoglobin: 2.4 % — ABNORMAL HIGH (ref 0.5–1.5)
Methemoglobin: 1.9 % — ABNORMAL HIGH (ref 0.0–1.5)
Methemoglobin: <0.7 % (ref 0.0–1.5)
O2 Saturation: 89.2 %
O2 Saturation: 90.1 %
Total hemoglobin: <6.9 g/dL — CL (ref 12.0–16.0)
Total hemoglobin: 7 g/dL — ABNORMAL LOW (ref 12.0–16.0)

## 2023-07-25 LAB — PROCALCITONIN: Procalcitonin: 6.36 ng/mL

## 2023-07-25 LAB — CBC
HCT: 23.3 % — ABNORMAL LOW (ref 39.0–52.0)
Hemoglobin: 7.4 g/dL — ABNORMAL LOW (ref 13.0–17.0)
MCH: 29.5 pg (ref 26.0–34.0)
MCHC: 31.8 g/dL (ref 30.0–36.0)
MCV: 92.8 fL (ref 80.0–100.0)
Platelets: 234 10*3/uL (ref 150–400)
RBC: 2.51 MIL/uL — ABNORMAL LOW (ref 4.22–5.81)
RDW: 20.7 % — ABNORMAL HIGH (ref 11.5–15.5)
WBC: 30.7 10*3/uL — ABNORMAL HIGH (ref 4.0–10.5)
nRBC: 0.4 % — ABNORMAL HIGH (ref 0.0–0.2)

## 2023-07-25 LAB — HEPATIC FUNCTION PANEL
ALT: 23 U/L (ref 0–44)
AST: 49 U/L — ABNORMAL HIGH (ref 15–41)
Albumin: 2 g/dL — ABNORMAL LOW (ref 3.5–5.0)
Alkaline Phosphatase: 118 U/L (ref 38–126)
Bilirubin, Direct: 0.5 mg/dL — ABNORMAL HIGH (ref 0.0–0.2)
Indirect Bilirubin: 0.8 mg/dL (ref 0.3–0.9)
Total Bilirubin: 1.3 mg/dL — ABNORMAL HIGH (ref 0.0–1.2)
Total Protein: 6.7 g/dL (ref 6.5–8.1)

## 2023-07-25 LAB — TYPE AND SCREEN
ABO/RH(D): O POS
Antibody Screen: NEGATIVE

## 2023-07-25 LAB — LACTATE DEHYDROGENASE: LDH: 480 U/L — ABNORMAL HIGH (ref 98–192)

## 2023-07-25 LAB — HEMOGLOBIN AND HEMATOCRIT, BLOOD
HCT: 23 % — ABNORMAL LOW (ref 39.0–52.0)
Hemoglobin: 7.2 g/dL — ABNORMAL LOW (ref 13.0–17.0)

## 2023-07-25 LAB — TSH: TSH: 0.033 u[IU]/mL — ABNORMAL LOW (ref 0.350–4.500)

## 2023-07-25 LAB — FIBRINOGEN: Fibrinogen: 637 mg/dL — ABNORMAL HIGH (ref 210–475)

## 2023-07-25 LAB — HEPARIN LEVEL (UNFRACTIONATED)
Heparin Unfractionated: 0.37 [IU]/mL (ref 0.30–0.70)
Heparin Unfractionated: 0.49 [IU]/mL (ref 0.30–0.70)

## 2023-07-25 LAB — T4, FREE: Free T4: 0.26 ng/dL — ABNORMAL LOW (ref 0.61–1.12)

## 2023-07-25 MED ORDER — KETAMINE HCL-SODIUM CHLORIDE 1000-0.69 MG/100ML-% IV SOLN
1.0000 mg/kg/h | INTRAVENOUS | Status: DC
  Administered 2023-07-25 – 2023-07-26 (×3): 1 mg/kg/h via INTRAVENOUS
  Filled 2023-07-25 (×3): qty 100

## 2023-07-25 MED ORDER — METHIMAZOLE 10 MG PO TABS
10.0000 mg | ORAL_TABLET | Freq: Every day | ORAL | Status: DC
  Administered 2023-07-26: 10 mg
  Filled 2023-07-25: qty 1

## 2023-07-25 MED ORDER — REVEFENACIN 175 MCG/3ML IN SOLN
175.0000 ug | Freq: Every day | RESPIRATORY_TRACT | Status: DC
  Administered 2023-07-25 – 2023-08-03 (×9): 175 ug via RESPIRATORY_TRACT
  Filled 2023-07-25 (×11): qty 3

## 2023-07-25 MED ORDER — SODIUM CHLORIDE 0.9% FLUSH
10.0000 mL | Freq: Two times a day (BID) | INTRAVENOUS | Status: DC
  Administered 2023-07-25: 30 mL
  Administered 2023-07-26 – 2023-07-28 (×5): 10 mL

## 2023-07-25 MED ORDER — VITAL 1.5 CAL PO LIQD
1000.0000 mL | ORAL | Status: DC
Start: 2023-07-25 — End: 2023-08-01
  Administered 2023-07-25 – 2023-08-01 (×11): 1000 mL

## 2023-07-25 MED ORDER — ZINC SULFATE 220 (50 ZN) MG PO CAPS
220.0000 mg | ORAL_CAPSULE | Freq: Every day | ORAL | Status: DC
  Administered 2023-07-25 – 2023-08-04 (×9): 220 mg via ORAL
  Filled 2023-07-25 (×11): qty 1

## 2023-07-25 MED ORDER — BUDESONIDE 0.25 MG/2ML IN SUSP
0.2500 mg | Freq: Two times a day (BID) | RESPIRATORY_TRACT | Status: DC
  Administered 2023-07-25 – 2023-08-03 (×19): 0.25 mg via RESPIRATORY_TRACT
  Filled 2023-07-25 (×21): qty 2

## 2023-07-25 MED ORDER — AMIODARONE HCL 200 MG PO TABS
200.0000 mg | ORAL_TABLET | Freq: Two times a day (BID) | ORAL | Status: DC
  Administered 2023-07-25 – 2023-07-26 (×3): 200 mg
  Filled 2023-07-25 (×3): qty 1

## 2023-07-25 MED ORDER — VITAMIN C 500 MG PO TABS
250.0000 mg | ORAL_TABLET | Freq: Two times a day (BID) | ORAL | Status: DC
  Administered 2023-07-25 – 2023-07-26 (×3): 250 mg
  Filled 2023-07-25 (×3): qty 1

## 2023-07-25 MED ORDER — LABETALOL HCL 5 MG/ML IV SOLN
10.0000 mg | INTRAVENOUS | Status: DC | PRN
  Administered 2023-07-26 (×3): 10 mg via INTRAVENOUS
  Filled 2023-07-25 (×2): qty 4

## 2023-07-25 MED ORDER — ARFORMOTEROL TARTRATE 15 MCG/2ML IN NEBU
15.0000 ug | INHALATION_SOLUTION | Freq: Two times a day (BID) | RESPIRATORY_TRACT | Status: DC
  Administered 2023-07-25 – 2023-08-03 (×19): 15 ug via RESPIRATORY_TRACT
  Filled 2023-07-25 (×21): qty 2

## 2023-07-25 MED ORDER — PRISMASOL BGK 4/2.5 32-4-2.5 MEQ/L EC SOLN: Status: DC

## 2023-07-25 MED ORDER — LACTULOSE 10 GM/15ML PO SOLN
30.0000 g | Freq: Two times a day (BID) | ORAL | Status: DC
  Administered 2023-07-25 – 2023-07-27 (×6): 30 g
  Filled 2023-07-25 (×6): qty 45

## 2023-07-25 MED ORDER — RENA-VITE PO TABS
1.0000 | ORAL_TABLET | Freq: Every day | ORAL | Status: DC
  Administered 2023-07-25: 1

## 2023-07-25 MED ORDER — DARBEPOETIN ALFA 200 MCG/0.4ML IJ SOSY
200.0000 ug | PREFILLED_SYRINGE | INTRAMUSCULAR | Status: DC
  Administered 2023-07-25 – 2023-08-01 (×2): 200 ug via SUBCUTANEOUS
  Filled 2023-07-25 (×3): qty 0.4

## 2023-07-25 MED ORDER — SODIUM CHLORIDE 0.9% FLUSH: 10.0000 mL | INTRAVENOUS | Status: DC | PRN

## 2023-07-25 NOTE — Progress Notes (Signed)
      [address]       [place] [address]             [phone]      Intubated, sedated  BP 131/68   Pulse 80   Temp 97.9 F (36.6 C) (Axillary)   Resp 20   Ht 6\' 4"  (1.93 m)   Wt 87.4 kg   SpO2 100%   BMI 23.45 kg/m   Right shoulder incision clean and dry  Good pulses  Viviann Spare C. Dorris Fetch, MD [facility] [phone]

## 2023-07-25 NOTE — Progress Notes (Addendum)
 PHARMACY - ANTICOAGULATION CONSULT NOTE  Pharmacy Consult for heparin Indication: pulmonary embolus  Labs: Recent Labs    07/22/23 0412 07/22/23 0415 07/22/23 1636 07/22/23 1649 07/22/23 2308 07/23/23 0414 07/23/23 0415 07/23/23 1549 07/23/23 1608 07/24/23 0412 07/24/23 0432 07/24/23 0442 07/24/23 1542 07/24/23 2010 07/25/23 0208  HGB 8.2*   < >  --    < >  --  9.0*   < >  --    < >  --  8.5* 9.2*  --  7.3*  --   HCT 25.3*   < >  --    < >  --  27.9*   < >  --    < >  --  26.3* 27.0*  --  22.8*  --   PLT 215  --   --   --   --  229  --   --   --   --  283  --   --  242  --   APTT 124*  --   --   --   --  143*  --   --   --  44*  --   --   --   --   --   HEPARINUNFRC 0.31  --  0.35  --   --   --   --   --   --   --   --   --  0.16*  --  0.37  CREATININE 3.29*  3.32*   < > 3.13*  --   --  3.15*  2.99*  --  3.35*  --  3.60*  --   --  4.17*  --   --   TROPONINIHS  --   --   --   --  30*  --   --   --   --   --   --   --   --   --   --    < > = values in this interval not displayed.   Assessment/Plan:  38yo male therapeutic on heparin after rate change. Will continue infusion at current rate of 1950 units/hr (in addition to 500 units/hr in CRRT circuit) and confirm stable with additional level.  Vernard Gambles, PharmD, BCPS 07/25/2023 2:47 AM

## 2023-07-25 NOTE — Plan of Care (Signed)
  Problem: Education: Goal: Knowledge of General Education information will improve Description: Including pain rating scale, medication(s)/side effects and non-pharmacologic comfort measures Outcome: Not Progressing   Problem: Health Behavior/Discharge Planning: Goal: Ability to manage health-related needs will improve Outcome: Not Progressing   Problem: Clinical Measurements: Goal: Ability to maintain clinical measurements within normal limits will improve Outcome: Progressing Goal: Will remain free from infection Outcome: Progressing Goal: Diagnostic test results will improve Outcome: Progressing Goal: Respiratory complications will improve Outcome: Progressing Goal: Cardiovascular complication will be avoided Outcome: Progressing   Problem: Activity: Goal: Risk for activity intolerance will decrease Outcome: Progressing   Problem: Nutrition: Goal: Adequate nutrition will be maintained Outcome: Progressing

## 2023-07-25 NOTE — Progress Notes (Signed)
 Patient ID: Aaron [name], male   DOB: March 11, 1986, 38 y.o.   MRN: [id number]     Advanced Heart Failure Rounding Note  Cardiologist: Christell Constant, MD  Chief Complaint: V-A ECMO  Subjective:    Admitted 2/26 with worsening shortness of breath, chest pain, atrial fibrillation with RVR. Decompensated 2/27 with IVF, nodal blockade with subsequent respiratory arrest, ROSC achieved after ~16 minute down time Femoral VA ecmo cannulation 2/27 with Impella CP vent ECMO decannulation 3/8 Impella removed and extubated 3/12.  Had to be reintubated with mucus plugging and left lung opacification.  MRI breat 3/13 with small infarcts right cerebral and cerebellar hemispheres  CXR shows improved left lung. He remains on vent.   Patient is off milrinone, co-ox inaccurate this morning.  He has been on and off pressors, varies with sedation.  Currently on NE 13.  CVVH ongoing, 50 cc/hr net negative UF with I/Os even. CVP 5 today.   He remains on mexiletine, amiodarone gtt was stopped with bradycardia yesterday.  Remains in NSR this morning.   Afebrile with WBCs 26 => 30 => 42 => 47 => 31.  He is on vancomycin/meropenem/micafungin.   Echo (3/10): EF 35-40%, anterior and septal HK, normal RV, s/p Mitraclip with mild MR and mean gradient 7 mmHg.   Objective:   Weight Range: 87.4 kg Body mass index is 23.45 kg/m.   Vital Signs:   Temp:  [97.8 F (36.6 C)-99.1 F (37.3 C)] 98.6 F (37 C) (03/14 0415) Pulse Rate:  [54-99] 63 (03/14 0700) Resp:  [13-27] 20 (03/14 0700) BP: (88-126)/(46-61) 126/59 (03/14 0600) SpO2:  [89 %-100 %] 100 % (03/14 0700) Arterial Line BP: (89-217)/(37-109) 123/48 (03/14 0700) FiO2 (%):  [50 %-60 %] 50 % (03/14 0300) Last BM Date : 07/24/23  Weight change: Filed Weights   07/21/23 0500 07/22/23 0500 07/23/23 0500  Weight: 90.8 kg 90.2 kg 87.4 kg    Intake/Output:   Intake/Output Summary (Last 24 hours) at 07/25/2023 0727 Last data filed at 07/25/2023  0700 Gross per 24 hour  Intake 3964.32 ml  Output 4204 ml  Net -239.68 ml      Physical Exam    General: On vent Neck: No JVD, no thyromegaly or thyroid nodule.  Lungs: Clear to auscultation bilaterally with normal respiratory effort. CV: Nondisplaced PMI.  Heart regular S1/S2, no S3/S4, no murmur.  No peripheral edema.   Abdomen: Soft, nontender, no hepatosplenomegaly, no distention.  Skin: Intact without lesions or rashes.  Neurologic: Sedated Extremities: No clubbing or cyanosis.  HEENT: Normal.   Telemetry   NSR 80s, no VT (personally reviewed)  Labs    CBC Recent Labs    07/24/23 2010 07/25/23 0407 07/25/23 0413  WBC 35.9* 30.7*  --   HGB 7.3* 7.4* 7.8*  HCT 22.8* 23.3* 23.0*  MCV 92.7 92.8  --   PLT 242 234  --    Basic Metabolic Panel Recent Labs    46/96/29 0412 07/24/23 0442 07/25/23 0208 07/25/23 0407 07/25/23 0413  NA 130*   < > 130* 131* 133*  K 5.5*   < > 3.8 3.5 3.8  CL 98   < > 97* 100  --   CO2 21*   < > 20* 21*  --   GLUCOSE 133*   < > 112* 121*  --   BUN 56*   < > 54* 51*  --   CREATININE 3.60*   < > 3.39* 3.29*  --   CALCIUM 8.3*   < >  8.0* 7.7*  --   MG 2.5*  --  2.3  --   --   PHOS 4.5   < > 4.1 4.0  --    < > = values in this interval not displayed.   Liver Function Tests Recent Labs    07/24/23 0412 07/24/23 1542 07/25/23 0208 07/25/23 0407  AST 61*  --  49*  --   ALT 25  --  23  --   ALKPHOS 147*  --  118  --   BILITOT 1.5*  --  1.3*  --   PROT 7.6  --  6.7  --   ALBUMIN 2.2*  2.2*   < > 2.0* 1.9*   < > = values in this interval not displayed.    BNP: BNP (last 3 results) Recent Labs    07/09/23 1934 07/10/23 1224  BNP 703.7* 980.5*   Thyroid Function Tests Recent Labs    07/25/23 0208  TSH 0.033*      Medications:     Scheduled Medications:  acetaminophen  650 mg Per Tube Q6H   amiodarone  200 mg Per Tube BID   arformoterol  15 mcg Nebulization BID   aspirin  81 mg Per Tube Daily   budesonide  (PULMICORT) nebulizer solution  0.25 mg Nebulization BID   Chlorhexidine Gluconate Cloth  6 each Topical Daily   clonazePAM  1 mg Per Tube BID   feeding supplement (PROSource TF20)  60 mL Per Tube BID   feeding supplement (VITAL HIGH PROTEIN)  1,000 mL Per Tube Q24H   fiber supplement (BANATROL TF)  60 mL Per Tube BID   folic acid  1 mg Per Tube Daily   insulin aspart  0-20 Units Subcutaneous Q4H   methimazole  10 mg Per Tube Q8H   mexiletine  250 mg Per Tube Q12H   multivitamin  1 tablet Oral QHS   mupirocin ointment  1 Application Nasal BID   mouth rinse  15 mL Mouth Rinse Q2H   oxyCODONE  5 mg Per Tube Q6H   pantoprazole (PROTONIX) IV  40 mg Intravenous QHS   revefenacin  175 mcg Nebulization Daily   sodium chloride flush  3 mL Intravenous Q12H   thiamine  100 mg Per Tube Daily   Or   thiamine  100 mg Intravenous Daily    Infusions:  clevidipine Stopped (07/24/23 1518)   dexmedetomidine (PRECEDEX) IV infusion Stopped (07/24/23 1555)   feeding supplement (PIVOT 1.5 CAL) Stopped (07/23/23 0001)   fentaNYL infusion INTRAVENOUS 300 mcg/hr (07/25/23 0700)   heparin 10,000 units/ 20 mL infusion syringe 500 Units/hr (07/25/23 0415)   heparin 1,950 Units/hr (07/25/23 0700)   meropenem (MERREM) IV Stopped (07/25/23 0549)   micafungin (MYCAMINE) 200 mg in sodium chloride 0.9 % 100 mL IVPB Stopped (07/24/23 1154)   norepinephrine (LEVOPHED) Adult infusion 13 mcg/min (07/25/23 0700)   prismasol BGK 0/2.5 400 mL/hr at 07/25/23 0235   prismasol BGK 0/2.5 400 mL/hr at 07/25/23 0237   PrismaSol BGK 2/3.5 1,500 mL/hr at 07/25/23 0036   propofol (DIPRIVAN) infusion 60 mcg/kg/min (07/25/23 0700)   Vancomycin (VANCOCIN) 1,250 mg in sodium chloride 0.9 % 250 mL IVPB Stopped (07/24/23 1444)   vasopressin Stopped (07/25/23 0238)    PRN Medications: docusate, fentaNYL, heparin, HYDROmorphone, midazolam, mouth rinse, polyethylene glycol    Patient Profile   Patient with a history of Grave's  disease, severe primary mitral regurgitation who presented with chest pain and segmental PE. Subsequent progressed to  SCAI Stage E shock requiring VA ECMO cannulation on 2/27.   Assessment/Plan   SCAI Stage E Cardiogenic shock - Suspect hypoxic respiratory failure driven with segmental PE on top of severe MR, nodal blockage, and thyrotoxicosis - Down time ~ 20 minutes, good mental status confirmed post code - We performed a mini-turn down on 07/15/23 at bedside; ECMO flows were reduced in a stepwise pattern by 0.5LPM to a goal of 2.5LPM. During this time, impella 5.5 flows increased to maintain net even flows. With reduction of ECMO flows, patient had onset of frequent PVCs/NSVT requiring amio boluses. In addition, despite increased LV unloading patient continued to have severe 4+ MR with progressive RV failure on bedside TTE.  - Underwent successful mTEER on 07/16/23 with placement of x3 clips and improvement in MR from severe to mild. Mean mitral gradient of 3-36mmHg.  - Decannulated from V-A ECMO by Dr. Karin Lieu on 07/19/23 with vascular cut down and primary repair of left femoral artery.  - Echo on 3/10 showed EF 35-40%, anterior and septal HK, normal RV, s/p Mitraclip with mild MR and mean gradient 7 mmHg.  - Impella removed 3/12.  - CVVH ongoing, net negative 50 cc/hr.  CVP 5 today. Continue UF today via CVVH at even to 50 cc/hr net negative.   - He is off milrinone, repeat co-ox (inaccurate this morning).  - On and off NE, this seems to be mainly a function of sedation.     Severe mitral valve regurgitation - Noted on previous echocardiogram - Now s/p mTEER on 07/16/23; improvement in MR from severe to mild.  - No murmur on exam  Pulmonary hypertension - PA pressures reached 80s/30s with PCWP of ~10, TPG of ~41mmHg suggestive of PVR >5 on 07/17/22 - He is now off NO, swan is out. Overall observational data is not supportive with the use of sildenafil in fixed PH secondary to MS/MR.   VT/NSVT -  Frequent runs of NSVT likely secondary to impella 5.5 position. Impella now out.  - Now on mexiletine.  Amiodarone gtt was stopped due to bradycardia last night.  - No further VT. - Will restart amiodarone 200 mg bid per tube.      ID - Still on vancomycin/meropenem - Started on micafungin on 07/19/23 - WBCs 22.8 => 26 => 30 => 42 => 47 => 31, afebrile.  - Bronchoscopies with thick brown secretions but no culture data.  For now, continue current regimen.  - CXR improved  Anemia - Hgb 7.4 today, transfuse hgb < 7.   Acute renal failure/hyperkalemia - CVVH ongoing, continue net negative UF aim for even to -50 cc/hr today with CVP 5.   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   Atrial fibrillation - Present on arrival, in the setting of PE and thyroid disease - sinus rhythm today.  - Transition to amiodarone per tube.   Pulmonary embolism:  - Segmental without right heart strain - Heparin gtt.    CAD - Prior embolic infarct in the LAD territory with corresponding LGE on CMR - Lifelong anticoagulation - On heparin gtt here.   Acute hypoxemic respiratory failure - Extubated and reintubated 3/12.   - Thick secretions with complete opacification of left lung, now CXR improved with bronchoscopies and suction.   - Vent per CCM.    CVA - MRI head 3/13 with small infarcts  right cerebral and cerebellar hemispheres. This should not significantly affect consciousness.  - Suspect embolic, on heparin gtt.   CRITICAL CARE Performed by: Marca Ancona  Total critical care time: 45 minutes  Critical care time was exclusive of separately billable procedures and treating other patients.  Critical care was necessary to treat or prevent imminent or life-threatening deterioration.  Critical care was time spent personally by me on the following activities:  development of treatment plan with patient and/or surrogate as well as nursing, discussions with consultants, evaluation of patient's response to treatment, examination of patient, obtaining history from patient or surrogate, ordering and performing treatments and interventions, ordering and review of laboratory studies, ordering and review of radiographic studies, pulse oximetry and re-evaluation of patient's condition.   Length of Stay: 71  Marca Ancona, MD  07/25/2023, 7:27 AM  Advanced Heart Failure Team Pager [phone] (M-F; 7a - 5p)  Please contact [facility] for night-coverage after hours (5p -7a ) and weekends on [email or web address]

## 2023-07-25 NOTE — Plan of Care (Signed)
  Problem: Education: Goal: Knowledge of General Education information will improve Description: Including pain rating scale, medication(s)/side effects and non-pharmacologic comfort measures Outcome: Not Progressing   Problem: Clinical Measurements: Goal: Cardiovascular complication will be avoided Outcome: Progressing   Problem: Activity: Goal: Risk for activity intolerance will decrease Outcome: Not Progressing   Problem: Nutrition: Goal: Adequate nutrition will be maintained Outcome: Progressing   Problem: Elimination: Goal: Will not experience complications related to bowel motility Outcome: Progressing   Problem: Pain Managment: Goal: General experience of comfort will improve and/or be controlled Outcome: Progressing   Problem: Safety: Goal: Ability to remain free from injury will improve Outcome: Progressing   Problem: Skin Integrity: Goal: Risk for impaired skin integrity will decrease Outcome: Progressing

## 2023-07-25 NOTE — Progress Notes (Addendum)
 [facility]  Subjective  - Intubated   Objective (!) 126/59 72 98.6 F (37 C) (Oral) 20 100%  Intake/Output Summary (Last 24 hours) at 07/25/2023 0655 Last data filed at 07/25/2023 0600 Gross per 24 hour  Intake 3940.33 ml  Output 4163 ml  Net -222.67 ml     Left LE warm without ischemic changes   Assessment/Planning: POD # 7   Extracorporeal membrane oxygenation decannulation Left iliofemoral, femoral-popliteal embolectomy Left common femoral artery primary repair Left superficial femoral artery primary repair Vacuum-assisted dressing placement-Prevena  Left groin incision healing well without signs of infection Well perfused left LE, compression with co band in place Stable de cannulation incision   Mosetta Pigeon 07/25/2023 6:55 AM --  VASCULAR STAFF ADDENDUM: I have independently interviewed and examined the patient. I agree with the above.  Site looks great.dry dressing  2+ DP. Staples out 3/28  Victorino Sparrow MD [facility] Phone Number: [phone] 07/25/2023 11:10 AM    Laboratory Lab Results: Recent Labs    07/24/23 2010 07/25/23 0407 07/25/23 0413  WBC 35.9* 30.7*  --   HGB 7.3* 7.4* 7.8*  HCT 22.8* 23.3* 23.0*  PLT 242 234  --    BMET Recent Labs    07/25/23 0208 07/25/23 0407 07/25/23 0413  NA 130* 131* 133*  K 3.8 3.5 3.8  CL 97* 100  --   CO2 20* 21*  --   GLUCOSE 112* 121*  --   BUN 54* 51*  --   CREATININE 3.39* 3.29*  --   CALCIUM 8.0* 7.7*  --     COAG Lab Results  Component Value Date   INR 1.2 07/19/2023   INR 1.4 (H) 07/18/2023   INR 1.3 (H) 07/17/2023   No results found for: "PTT"

## 2023-07-25 NOTE — Progress Notes (Signed)
 PT Cancellation Note  Patient Details Name: Aaron [name] MRN: [id number] DOB: Sep 19, 1985   Cancelled Treatment:    Reason Eval/Treat Not Completed: Medical issues which prohibited therapy. Per RN, pt is sedated and unable to participate meaningfully in PT. Acute PT to sign off with plan to re-consult when pt is appropriate for PT eval.  Hilton Cork, PT, DPT Secure Chat Preferred  Rehab Office [phone]   Arturo Morton Brion [facility] 07/25/2023, 9:22 AM

## 2023-07-25 NOTE — Procedures (Signed)
 Cortrak  Person Inserting Tube:  Lyndi Holbein T, RD Tube Type:  Cortrak - 43 inches Tube Size:  10 Tube Location:  Left nare Initial Placement:  Stomach Secured by: Bridle Technique Used to Measure Tube Placement:  Marking at nare/corner of mouth Cortrak Secured At:  72 cm   Cortrak Tube Team Note:  Consult received to place a Cortrak feeding tube.   No x-ray is required. RN may begin using tube.   If the tube becomes dislodged please keep the tube and contact the Cortrak team at [email or web address] for replacement.  If after hours and replacement cannot be delayed, place a NG tube and confirm placement with an abdominal x-ray.    Shelle Iron RD, LDN Contact via [employer].

## 2023-07-25 NOTE — Progress Notes (Signed)
 [facility] NEPHROLOGY PROGRESS NOTE  Assessment/ Plan: Pt is a 38 y.o. yo male  likely ATN after cardiac arrest, cardiogenic shock on ECMO ( decan 3/8) and Impella ( removed 3/12), AHRF/VDRF   # Dialysis dependent anuric AKI 2/2 ATN from cardiogenic shock on CRRT since 07/11/23, Left subclavian temp HD cath placed by CCM;  Tolerating CRRT well however getting interruptions because of procedures, imaging studies etc.  The potassium level was running high therefore the CRRT prescription was changed yesterday. Now K in the 3's-  change again to 4 K pre and post and cont 2 K dialysate-  UF around 50 / hour-  volume does not seem terrible.   Clotted this AM-  variety of factors-  re set up with heparin   # Cardiogenic Shock,  Impella removed; status post transcutaneous mitral valve repair; previously on ECMO. per cardiology.  # VT + PEA SCA, as per cardiology team. # PE on heparin per CCM # Anemia, Hb  dropping--  will add ESA  # Hyperkalemia, less present .  Existing CRRT. # Severe MR s/p TEER 3/5 # Hypophosphatemia due to CRRT: Replating IV phosphorus and repeating lab. No repletion needed today   Subjective:  Remains intubated and sedated.  No urine output.recorded.  CRRT currently clotted   Objective Vital signs in last 24 hours: Vitals:   07/25/23 0630 07/25/23 0645 07/25/23 0700 07/25/23 0728  BP:      Pulse: 68 66 63   Resp: 20 20 20  (!) 26  Temp:      TempSrc:      SpO2: 100% 100% 100%   Weight:      Height:       Weight change:   Intake/Output Summary (Last 24 hours) at 07/25/2023 0823 Last data filed at 07/25/2023 0700 Gross per 24 hour  Intake 3911.43 ml  Output 4169 ml  Net -257.57 ml       Labs: RENAL PANEL Recent Labs  Lab 07/21/23 0418 07/21/23 1608 07/22/23 0412 07/22/23 0415 07/23/23 0414 07/23/23 0415 07/23/23 1549 07/23/23 1608 07/24/23 0412 07/24/23 0442 07/24/23 1542 07/25/23 0208 07/25/23 0407 07/25/23 0413  NA 134*   < >  132*  132*   < > 133*  132*   < > 130*   < > 130* 131* 134* 130* 131* 133*  K 3.8   < > 4.0  4.0   < > 5.3*  5.2*   < > 5.2*   < > 5.5* 5.5* 4.5 3.8 3.5 3.8  CL 99   < > 99  100   < > 98  97*  --  96*  --  98  --  99 97* 100  --   CO2 22   < > 22  21*   < > 21*  21*  --  22  --  21*  --  20* 20* 21*  --   GLUCOSE 121*   < > 133*  132*   < > 117*  116*  --  145*  --  133*  --  117* 112* 121*  --   BUN 58*   < > 54*  55*   < > 54*  54*  --  56*  --  56*  --  60* 54* 51*  --   CREATININE 3.59*   < > 3.29*  3.32*   < > 3.15*  2.99*  --  3.35*  --  3.60*  --  4.17* 3.39* 3.29*  --  CALCIUM 8.3*   < > 8.5*  8.4*   < > 8.7*  9.0  --  8.6*  --  8.3*  --  8.2* 8.0* 7.7*  --   MG 2.6*  --  2.6*  --  2.6*  --   --   --  2.5*  --   --  2.3  --   --   PHOS 3.0   < > 1.8*   < > 3.9  --  3.9  --  4.5  --  5.1* 4.1 4.0  --   ALBUMIN 2.3*  2.3*   < > 2.1*  2.1*   < > 2.4*  2.4*  --  2.4*  --  2.2*  2.2*  --  2.1* 2.0* 1.9*  --    < > = values in this interval not displayed.    Liver Function Tests: Recent Labs  Lab 07/23/23 0414 07/23/23 1549 07/24/23 0412 07/24/23 1542 07/25/23 0208 07/25/23 0407  AST 59*  --  61*  --  49*  --   ALT 24  --  25  --  23  --   ALKPHOS 174*  --  147*  --  118  --   BILITOT 1.4*  --  1.5*  --  1.3*  --   PROT 8.0  --  7.6  --  6.7  --   ALBUMIN 2.4*  2.4*   < > 2.2*  2.2* 2.1* 2.0* 1.9*   < > = values in this interval not displayed.   No results for input(s): "LIPASE", "AMYLASE" in the last 168 hours. Recent Labs  Lab 07/24/23 1155  AMMONIA 63*   CBC: Recent Labs    07/24/23 0432 07/24/23 0442 07/24/23 2010 07/25/23 0407 07/25/23 0413  HGB 8.5* 9.2* 7.3* 7.4* 7.8*  MCV 89.5  --  92.7 92.8  --     Cardiac Enzymes: No results for input(s): "CKTOTAL", "CKMB", "CKMBINDEX", "TROPONINI" in the last 168 hours. CBG: Recent Labs  Lab 07/24/23 1555 07/24/23 2012 07/25/23 0002 07/25/23 0005 07/25/23 0411  GLUCAP 110* 121* 56* 98 133*     Iron Studies: No results for input(s): "IRON", "TIBC", "TRANSFERRIN", "FERRITIN" in the last 72 hours. Studies/Results: DG Chest Port 1 View Result Date: 07/24/2023 CLINICAL DATA:  Status post central line placement. EXAM: PORTABLE CHEST 1 VIEW COMPARISON:  July 24, 2023 (1:24 a.m.) FINDINGS: Since the prior study there is been further advancement of the previously noted left-sided subclavian catheter. Its distal tip now sits within the distal aspect of the superior vena cava. The additional catheters, ET tube and orogastric tube seen on the prior study are unchanged in position. Since the prior exam there is improved aeration of the left upper lobe with persistent mild to moderate severity bilateral left perihilar and right infrahilar infiltrates. Moderate severity left basilar atelectasis and/or infiltrate is seen within the retrocardiac region of the left lung base. There is a small left pleural effusion. No pneumothorax is identified. The visualized skeletal structures are unremarkable. IMPRESSION: 1. Interval advancement of the left-sided subclavian catheter with its distal tip now within the distal aspect of the superior vena cava. 2. Improved aeration of the left upper lobe with persistent bilateral left perihilar and right infrahilar infiltrates. 3. Moderate severity left basilar atelectasis and/or infiltrate. 4. Small left pleural effusion. Electronically Signed   By: Aram Candela M.D.   On: 07/24/2023 19:34   MR BRAIN WO CONTRAST Result Date: 07/24/2023 CLINICAL DATA:  Anoxic brain  damage.  Cardiac arrest EXAM: MRI HEAD WITHOUT CONTRAST TECHNIQUE: Multiplanar, multiecho pulse sequences of the brain and surrounding structures were obtained without intravenous contrast. COMPARISON:  Head CT 07/10/2023 FINDINGS: Brain: Small acute infarcts in the right cerebral white matter and peripheral right cerebellum. No indication of superimposed global anoxic injury. No hydrocephalus, mass, or  collection. Minimal blood products at right occipital white matter infarction attributed to petechial hemorrhage in this setting. Vascular: Major flow voids are preserved Skull and upper cervical spine: Normal marrow signal Sinuses/Orbits: Extensive bilateral mastoid opacification in the setting of intubation. IMPRESSION: Small acute infarcts in the right cerebral and cerebellar hemispheres. No generalized finding to implicate global anoxic injury. Electronically Signed   By: Tiburcio Pea M.D.   On: 07/24/2023 10:33   DG Abd 1 View Result Date: 07/24/2023 CLINICAL DATA:  5626 Acute respiratory failure [facility]) [id number] Encounter for feeding tube placement [id number] EXAM: ABDOMEN - 1 VIEW COMPARISON:  X-ray abdomen 07/17/2023, chest abdomen pelvis 07/10/2023 FINDINGS: Enteric tube with tip and side port overlying the gastric lumen. The bowel gas pattern is normal. No radio-opaque calculi or other significant radiographic abnormality are seen. IMPRESSION: Enteric tube in good position. Electronically Signed   By: Tish Frederickson M.D.   On: 07/24/2023 02:48   DG Chest Port 1 View Result Date: 07/24/2023 CLINICAL DATA:  Acute respiratory failure EXAM: PORTABLE CHEST 1 VIEW COMPARISON:  Chest x-ray 07/23/2023.  Chest CT 07/10/2023. FINDINGS: Endotracheal tube tip is 5.2 cm above the carina. Right-sided central venous catheter tip ends in the SVC. Left IJ catheter ends in the SVC. Left subclavian catheter ends in the level of the brachiocephalic vein, unchanged. Enteric tube extends below the diaphragm. The heart is enlarged, unchanged. There central pulmonary vascular congestion. There is improved aeration in the left lower lung. There is complete opacification of the upper right hemithorax similar to prior. There is a small layering left pleural effusion. Osseous structures are stable. IMPRESSION: 1. Improved aeration in the left lower lung. 2. Stable complete opacification of the upper right hemithorax. 3. Small  layering left pleural effusion. 4. Stable cardiomegaly and central pulmonary vascular congestion. Electronically Signed   By: Darliss Cheney M.D.   On: 07/24/2023 02:32   DG Chest Port 1 View Result Date: 07/24/2023 CLINICAL DATA:  Acute respiratory failure, hypoxia EXAM: PORTABLE CHEST 1 VIEW COMPARISON:  07/22/2023 FINDINGS: There is abrupt cut off of the left mainstem bronchus and there is now complete atelectasis of the left lung with marked mediastinal shift to the left and complete opacification of left hemithorax. The findings suggest a obstructing endobronchial lesion such as a mucous plug or aspirated foreign body. Left subclavian temporary hemodialysis catheter left internal jugular central venous catheter are unchanged in position though deviated to the left by mediastinal shift. Right upper extremity PICC line has been placed with its tip overlying the expected superior vena cava. Right lung is clear. No pneumothorax. No pleural effusion right. IMPRESSION: 1. Complete atelectasis of the left lung with marked mediastinal shift to the left and complete opacification of left hemithorax. The findings suggest a obstructing endobronchial lesion such as a mucous plug or aspirated foreign body. 2. Interval placement of right upper extremity PICC line, tip within the superior vena cava. Electronically Signed   By: Helyn Numbers M.D.   On: 07/24/2023 00:53   [place] EKG SITE RITE Result Date: 07/23/2023 If Site Rite image not attached, placement could not be confirmed due to current cardiac rhythm.  Medications: Infusions:  clevidipine Stopped (07/24/23 1518)   dexmedetomidine (PRECEDEX) IV infusion Stopped (07/24/23 1555)   feeding supplement (PIVOT 1.5 CAL) Stopped (07/23/23 0001)   fentaNYL infusion INTRAVENOUS 300 mcg/hr (07/25/23 0700)   heparin 10,000 units/ 20 mL infusion syringe 500 Units/hr (07/25/23 0415)   heparin 1,950 Units/hr (07/25/23 0700)   ketamine (KETALAR) adult infusion      meropenem (MERREM) IV Stopped (07/25/23 0549)   micafungin (MYCAMINE) 200 mg in sodium chloride 0.9 % 100 mL IVPB Stopped (07/24/23 1154)   norepinephrine (LEVOPHED) Adult infusion 13 mcg/min (07/25/23 0700)   prismasol BGK 0/2.5 400 mL/hr at 07/25/23 0235   prismasol BGK 0/2.5 400 mL/hr at 07/25/23 0237   PrismaSol BGK 2/3.5 1,500 mL/hr at 07/25/23 0036   propofol (DIPRIVAN) infusion 60 mcg/kg/min (07/25/23 0700)   Vancomycin (VANCOCIN) 1,250 mg in sodium chloride 0.9 % 250 mL IVPB Stopped (07/24/23 1444)   vasopressin Stopped (07/25/23 0238)    Scheduled Medications:  acetaminophen  650 mg Per Tube Q6H   amiodarone  200 mg Per Tube BID   arformoterol  15 mcg Nebulization BID   aspirin  81 mg Per Tube Daily   budesonide (PULMICORT) nebulizer solution  0.25 mg Nebulization BID   Chlorhexidine Gluconate Cloth  6 each Topical Daily   clonazePAM  1 mg Per Tube BID   feeding supplement (PROSource TF20)  60 mL Per Tube BID   feeding supplement (VITAL HIGH PROTEIN)  1,000 mL Per Tube Q24H   fiber supplement (BANATROL TF)  60 mL Per Tube BID   folic acid  1 mg Per Tube Daily   insulin aspart  0-20 Units Subcutaneous Q4H   methimazole  10 mg Per Tube Q8H   mexiletine  250 mg Per Tube Q12H   multivitamin  1 tablet Oral QHS   mupirocin ointment  1 Application Nasal BID   mouth rinse  15 mL Mouth Rinse Q2H   oxyCODONE  5 mg Per Tube Q6H   pantoprazole (PROTONIX) IV  40 mg Intravenous QHS   revefenacin  175 mcg Nebulization Daily   sodium chloride flush  3 mL Intravenous Q12H   thiamine  100 mg Per Tube Daily   Or   thiamine  100 mg Intravenous Daily    have reviewed scheduled and prn medications.  Physical Exam: General: Critically ill looking male, on CRRT. Heart:RRR, s1s2 nl Lungs: Intubated, sedated. Abdomen:soft, non-distended Extremities:No edema Dialysis Access: Left subclavian temporary HD line.-  newest line placed 3/9  Marbeth Smedley A Jovoni Borkenhagen 07/25/2023,8:23 AM  LOS: 16  days

## 2023-07-25 NOTE — Progress Notes (Addendum)
 NAME:  AJDIN MACKE, MRN:  [id number], DOB:  March 15, 1986, LOS: 16 ADMISSION DATE:  07/09/2023, CONSULTATION DATE:  07/11/2023 REFERRING MD: Clearnce Hasten, CHIEF COMPLAINT: Status post cardiac arrest  History of Present Illness:  38 year old male with hypothyroidism, paroxysmal A-fib, severe mitral regurgitation who presented to [facility] long hospital with chest pain and shortness of breath for few days associated cough, nausea and diarrhea.  In the emergency department he was diagnosed with PE, was started on IV heparin.  He takes beta-blocker, steroid and methimazole for hyperthyroidism.  On 2/27 patient rapidly deteriorated developed V. tach/V-fib cardiac arrest patient was intubated after 40 minutes of CPR, cardiology was consulted patient was placed on VA ECMO and was transferred to [facility]  Pertinent  Medical History   Past Medical History:  Diagnosis Date   Atrial fibrillation [facility])    Hyperthyroidism    Mitral regurgitation    NSTEMI (non-ST elevated myocardial infarction) (HCC)     Significant Hospital Events: Including procedures, antibiotic start and stop dates in addition to other pertinent events   3/5 salvage TEER (transcatheter edge to edge mitral valve repair 3/7 weaned off [facility] 3/8 bronch for mucus plugging 3/12 impella removed, extubation trial 3/13 reintubated, bronch for recurrent plugging, HD cath rewired due to malfunction  Interim History / Subjective:  No events Lung mechanics improved On 0 to -50 pull Heavily sedated with occasional need for pressors MRI results noted and shared with family  Objective   Blood pressure (!) 126/59, pulse 63, temperature 98.6 F (37 C), temperature source Oral, resp. rate (!) 26, height 6\' 4"  (1.93 m), weight 87.4 kg, SpO2 100%. CVP:  [2 mmHg-38 mmHg] 8 mmHg  Vent Mode: PRVC FiO2 (%):  [40 %-50 %] 40 % Set Rate:  [20 bmp] 20 bmp Vt Set:  [580 mL] 580 mL PEEP:  [8 cmH20] 8 cmH20 Plateau Pressure:  [19 cmH20-24  cmH20] 21 cmH20   Intake/Output Summary (Last 24 hours) at 07/25/2023 0829 Last data filed at 07/25/2023 0700 Gross per 24 hour  Intake 3911.43 ml  Output 4169 ml  Net -257.57 ml   Filed Weights   07/21/23 0500 07/22/23 0500 07/23/23 0500  Weight: 90.8 kg 90.2 kg 87.4 kg    Examination: No distress Lungs actually sounds really good Ext warm Opens eyes but not following commands yet Trace edema Old impella and ECMO sites look great  Hgb borderline BMP fine Pct pending Fever curve improved  Resolved Hospital Problem list   Lactic acidosis, resolved Refractory hyperkalemia, improving  Assessment & Plan:  Baseline severe mitral insufficiency with superimposed RV>LV shock state culminating in prolonged IHCA s/p VA ECMO 2/27- salvage TEER 3/5, weaned from [place] to impella 3/7.  Post arrest encephalopathy- did not follow commands post extubation; some reports that he did during ecmo run; MRI reassuring 3/13 Acute renal failure- on CRRT Thyrotoxicosis- on methimazole, stress steroids; stress steroids off on 3/8 Recurrent mucus plugging, resp failure- at baseline coughs up brown material (???) per fiance.  BAL 3/8 neg.  Failed extubation trial 3/12 due to recurrent plugging, BAL sent again. Leukocytosis- suspect mostly reactive, antimicrobials don't make a difference Strokes- small likely embolic  Heparin goal Xa 3-7 Holding vanc/micafungin; meropenem duration TBD may just DC since all cultures negative Amio down and milrinone off TF CRRT goal even Transition from prop to precedex+ketamine+fent; try to get RASS 0; needs good cough and alertness before next extubation trial; want to try to avoid trach F/u BAL culture Thyroid  labs reviewed: reduce methimazole and recheck in 1 week Amio resumed ?long long QTC, rechecking AM EKG  Best Practice (right click and "Reselect all SmartList Selections" daily)   Diet/type: NPO TF DVT prophylaxis heparin Pressure ulcer(s): sacrum, turn  PRN GI prophylaxis: PPI Lines: Central line, Arterial Line, and yes and it is still needed.   Foley:  Yes, and it is still needed Code Status:  full code Last date of multidisciplinary goals of care discussion [updated]  41 min cc time Myrla Halsted MD PCCM

## 2023-07-25 NOTE — Progress Notes (Addendum)
 Nutrition Follow-up  DOCUMENTATION CODES:   Severe malnutrition in context of acute illness/injury  INTERVENTION:   Plan for Cortrak  Tube Feeding via Cortrak/NG: Change to Vital 1.5 at 75 ml/hr Increase Pro-Source TF20 60 mL to QID TF provides 3020 kcals, 202 g of protein and 1368 mL of free water   Continue Banatrol BID,  recommend removal of rectal tube be considered  Continue Renal MVI Folic Acid and Thiamine  Add Vitamin C 250 mg BID daily x 30 days and Zinc Sulfate 220 mg daily x 14 days given increased risk for deficiency and increased needs for wound healing   NUTRITION DIAGNOSIS:   Severe Malnutrition related to acute illness as evidenced by moderate fat depletion, moderate muscle depletion.  GOAL:   Patient will meet greater than or equal to 90% of their needs  MONITOR:   TF tolerance, Vent status, Labs, Weight trends, Skin  REASON FOR ASSESSMENT:   Consult Enteral/tube feeding initiation and management (Trickle feeds)  ASSESSMENT:   38 y.o [name] presented with left-sided chest pain and shortness of breath, diagnosed with PE, coded and was intubated after 40 minutes of CPR. Subsequently ECMO started. PMH of PAF, chronic HFpEF, severe mitral regurgitation, hyperthyroidism. Noncompliance with medicines.   2/26 Admitted 2/27 V.tach/V.fib cardiac arrest, intubated, 40 minutes of CPR, tx to [facility], placed on VA ECMO 2/28 CRRT initiated via ECMO circuit, Citrate Protocol 3/01 Vital High Protein started at 20 ml/hr by MD 3/02 MD started titration of TF 3/03 OR: Exchanged Impella CP for impella 5.5, Cortrak  3/05 OR: transcatheter edge to edge mitral valve repair 3/07 OR: Decannulation from [facility] ECMO 3/10 ECHO with EF 35-40% 3/12 OR: Impella Removed, Extubated 3/13 Re-Intubated early AM on night shift, Bronch, MRI brain with small strokes (likely embolic)  Pt remains on vent support Propofol currently but transitioning off to Precedex plus Ketamine and  Fentanyl Levophed and Vasopressin currently OFF  TF restarted post re-intubation via NG tube, noted pt with orders for both Vital High Protein and Pivot 1.5; RN indicates Vital High Protein at 40 ml/hr currently infusing. RD to modify orders so that correct TF prescription is ordered and thus administered Plan for Cortrak today as pt will likely need post extubation  Noted left groin incision healing well per VS notes (staples to be removed 3/28) Stage II documented on 3/12 to sacrum  Stool volume has improved significantly-1L stool on last visit: only 35 mL documented yesterday but noted 4 occurrences of small brown stool; +small stool today. Recommend considering removal of rectal tube, continue Banatrol  Labs: Phosphorus 4.0 (wdl) Sodium 131 (L) Potassium 3.5 (wdl)  TSH 0.033 (L), T4 0.26 (L) -Thyrotoxicosis dx this admit, labs improving, on methimazole, received steroids. Could contribute to wt loss, diarrhea, etc  Meds:  Thiamine  Folic Acid Rena-vite Lactulose BID SS novolog Banatrol TF BID Aranesp   Diet Order:   Diet Order     None       EDUCATION NEEDS:   Not appropriate for education at this time  Skin:  Skin Assessment: Skin Integrity Issues: Skin Integrity Issues:: Stage II Stage II: sacrum Wound Vac: n/a Incisions: L axilla (closed), R groing-healing well per vascular surgery  Last BM:  1L stool output via FMS in 24 hours  Height:   Ht Readings from Last 1 Encounters:  07/09/23 6\' 4"  (1.93 m)    Weight:   Wt Readings from Last 1 Encounters:  07/23/23 87.4 kg    Ideal Body Weight:  91.8 kg  BMI:  Body mass index is 23.45 kg/m.  Estimated Nutritional Needs:   Kcal:  2700-3000 kcals  Protein:  190-210 gm  Fluid:  >2L/day  Romelle Starcher MS, RDN, LDN, CNSC Registered Dietitian 3 Clinical Nutrition RD Inpatient Contact Info in Amion

## 2023-07-25 NOTE — Progress Notes (Signed)
 eLink Physician-Brief Progress Note Patient Name: Aaron [name] DOB: Aug 24, 1985 MRN: [id number]   Date of Service  07/25/2023  HPI/Events of Note  Patient with bilateral pulmonary emboli currently intubated on CRRT.  Currently on ketamine, Precedex, fentanyl infusion and CRRT.  Mildly hypertensive  eICU Interventions  Add as needed antihypertensives     Intervention Category Intermediate Interventions: Hypertension - evaluation and management  River Ambrosio 07/25/2023, 10:54 PM

## 2023-07-26 DIAGNOSIS — I469 Cardiac arrest, cause unspecified: Secondary | ICD-10-CM | POA: Diagnosis not present

## 2023-07-26 DIAGNOSIS — I63441 Cerebral infarction due to embolism of right cerebellar artery: Secondary | ICD-10-CM | POA: Diagnosis not present

## 2023-07-26 DIAGNOSIS — J9601 Acute respiratory failure with hypoxia: Secondary | ICD-10-CM | POA: Diagnosis not present

## 2023-07-26 DIAGNOSIS — E875 Hyperkalemia: Secondary | ICD-10-CM | POA: Diagnosis not present

## 2023-07-26 DIAGNOSIS — E43 Unspecified severe protein-calorie malnutrition: Secondary | ICD-10-CM | POA: Diagnosis not present

## 2023-07-26 DIAGNOSIS — R079 Chest pain, unspecified: Secondary | ICD-10-CM | POA: Diagnosis not present

## 2023-07-26 DIAGNOSIS — Z006 Encounter for examination for normal comparison and control in clinical research program: Secondary | ICD-10-CM | POA: Diagnosis not present

## 2023-07-26 DIAGNOSIS — R570 Cardiogenic shock: Secondary | ICD-10-CM | POA: Diagnosis not present

## 2023-07-26 DIAGNOSIS — N170 Acute kidney failure with tubular necrosis: Secondary | ICD-10-CM | POA: Diagnosis not present

## 2023-07-26 DIAGNOSIS — Z1152 Encounter for screening for COVID-19: Secondary | ICD-10-CM | POA: Diagnosis not present

## 2023-07-26 DIAGNOSIS — I4901 Ventricular fibrillation: Secondary | ICD-10-CM | POA: Diagnosis not present

## 2023-07-26 DIAGNOSIS — R0602 Shortness of breath: Secondary | ICD-10-CM | POA: Diagnosis not present

## 2023-07-26 DIAGNOSIS — I5043 Acute on chronic combined systolic (congestive) and diastolic (congestive) heart failure: Secondary | ICD-10-CM | POA: Diagnosis not present

## 2023-07-26 DIAGNOSIS — E872 Acidosis, unspecified: Secondary | ICD-10-CM | POA: Diagnosis not present

## 2023-07-26 DIAGNOSIS — I462 Cardiac arrest due to underlying cardiac condition: Secondary | ICD-10-CM | POA: Diagnosis not present

## 2023-07-26 DIAGNOSIS — I2699 Other pulmonary embolism without acute cor pulmonale: Secondary | ICD-10-CM | POA: Diagnosis not present

## 2023-07-26 LAB — POCT I-STAT 7, (LYTES, BLD GAS, ICA,H+H)
Acid-base deficit: 2 mmol/L (ref 0.0–2.0)
Acid-base deficit: 4 mmol/L — ABNORMAL HIGH (ref 0.0–2.0)
Bicarbonate: 22.7 mmol/L (ref 20.0–28.0)
Bicarbonate: 21.5 mmol/L (ref 20.0–28.0)
Calcium, Ion: 1.15 mmol/L (ref 1.15–1.40)
Calcium, Ion: 1.14 mmol/L — ABNORMAL LOW (ref 1.15–1.40)
HCT: 24 % — ABNORMAL LOW (ref 39.0–52.0)
HCT: 24 % — ABNORMAL LOW (ref 39.0–52.0)
Hemoglobin: 8.2 g/dL — ABNORMAL LOW (ref 13.0–17.0)
Hemoglobin: 8.2 g/dL — ABNORMAL LOW (ref 13.0–17.0)
O2 Saturation: 99 %
O2 Saturation: 99 %
Patient temperature: 99.3
Patient temperature: 99.2
Potassium: 3.9 mmol/L (ref 3.5–5.1)
Potassium: 4.2 mmol/L (ref 3.5–5.1)
Sodium: 135 mmol/L (ref 135–145)
Sodium: 135 mmol/L (ref 135–145)
TCO2: 24 mmol/L (ref 22–32)
TCO2: 23 mmol/L (ref 22–32)
pCO2 arterial: 40.5 mmHg (ref 32–48)
pCO2 arterial: 40.3 mmHg (ref 32–48)
pH, Arterial: 7.358 (ref 7.35–7.45)
pH, Arterial: 7.336 — ABNORMAL LOW (ref 7.35–7.45)
pO2, Arterial: 165 mmHg — ABNORMAL HIGH (ref 83–108)
pO2, Arterial: 168 mmHg — ABNORMAL HIGH (ref 83–108)

## 2023-07-26 LAB — CBC
HCT: 23.8 % — ABNORMAL LOW (ref 39.0–52.0)
Hemoglobin: 7.5 g/dL — ABNORMAL LOW (ref 13.0–17.0)
MCH: 29.5 pg (ref 26.0–34.0)
MCHC: 31.5 g/dL (ref 30.0–36.0)
MCV: 93.7 fL (ref 80.0–100.0)
Platelets: 185 10*3/uL (ref 150–400)
RBC: 2.54 MIL/uL — ABNORMAL LOW (ref 4.22–5.81)
RDW: 20.8 % — ABNORMAL HIGH (ref 11.5–15.5)
WBC: 16.7 10*3/uL — ABNORMAL HIGH (ref 4.0–10.5)
nRBC: 0.2 % (ref 0.0–0.2)

## 2023-07-26 LAB — PHOSPHORUS: Phosphorus: 3.8 mg/dL (ref 2.5–4.6)

## 2023-07-26 LAB — RENAL FUNCTION PANEL
Albumin: 2.1 g/dL — ABNORMAL LOW (ref 3.5–5.0)
Albumin: 2.2 g/dL — ABNORMAL LOW (ref 3.5–5.0)
Anion gap: 10 (ref 5–15)
Anion gap: 11 (ref 5–15)
BUN: 46 mg/dL — ABNORMAL HIGH (ref 6–20)
BUN: 55 mg/dL — ABNORMAL HIGH (ref 6–20)
CO2: 22 mmol/L (ref 22–32)
CO2: 22 mmol/L (ref 22–32)
Calcium: 8 mg/dL — ABNORMAL LOW (ref 8.9–10.3)
Calcium: 8.1 mg/dL — ABNORMAL LOW (ref 8.9–10.3)
Chloride: 101 mmol/L (ref 98–111)
Chloride: 101 mmol/L (ref 98–111)
Creatinine, Ser: 3.24 mg/dL — ABNORMAL HIGH (ref 0.61–1.24)
Creatinine, Ser: 4.35 mg/dL — ABNORMAL HIGH (ref 0.61–1.24)
GFR, Estimated: 24 mL/min — ABNORMAL LOW (ref >60)
GFR, Estimated: 17 mL/min — ABNORMAL LOW (ref >60)
Glucose, Bld: 153 mg/dL — ABNORMAL HIGH (ref 70–99)
Glucose, Bld: 154 mg/dL — ABNORMAL HIGH (ref 70–99)
Phosphorus: 3.8 mg/dL (ref 2.5–4.6)
Phosphorus: 4.4 mg/dL (ref 2.5–4.6)
Potassium: 3.8 mmol/L (ref 3.5–5.1)
Potassium: 4.1 mmol/L (ref 3.5–5.1)
Sodium: 133 mmol/L — ABNORMAL LOW (ref 135–145)
Sodium: 134 mmol/L — ABNORMAL LOW (ref 135–145)

## 2023-07-26 LAB — TRIGLYCERIDES: Triglycerides: 148 mg/dL (ref <150)

## 2023-07-26 LAB — GLUCOSE, CAPILLARY
Glucose-Capillary: 114 mg/dL — ABNORMAL HIGH (ref 70–99)
Glucose-Capillary: 135 mg/dL — ABNORMAL HIGH (ref 70–99)
Glucose-Capillary: 140 mg/dL — ABNORMAL HIGH (ref 70–99)
Glucose-Capillary: 143 mg/dL — ABNORMAL HIGH (ref 70–99)
Glucose-Capillary: 144 mg/dL — ABNORMAL HIGH (ref 70–99)
Glucose-Capillary: 165 mg/dL — ABNORMAL HIGH (ref 70–99)

## 2023-07-26 LAB — CULTURE, RESPIRATORY W GRAM STAIN
Culture: NORMAL
Gram Stain: NONE SEEN

## 2023-07-26 LAB — LACTATE DEHYDROGENASE: LDH: 430 U/L — ABNORMAL HIGH (ref 98–192)

## 2023-07-26 LAB — BASIC METABOLIC PANEL
Anion gap: 13 (ref 5–15)
BUN: 47 mg/dL — ABNORMAL HIGH (ref 6–20)
CO2: 21 mmol/L — ABNORMAL LOW (ref 22–32)
Calcium: 8.2 mg/dL — ABNORMAL LOW (ref 8.9–10.3)
Chloride: 100 mmol/L (ref 98–111)
Creatinine, Ser: 3.39 mg/dL — ABNORMAL HIGH (ref 0.61–1.24)
GFR, Estimated: 23 mL/min — ABNORMAL LOW (ref >60)
Glucose, Bld: 150 mg/dL — ABNORMAL HIGH (ref 70–99)
Potassium: 3.9 mmol/L (ref 3.5–5.1)
Sodium: 134 mmol/L — ABNORMAL LOW (ref 135–145)

## 2023-07-26 LAB — COOXEMETRY PANEL
Carboxyhemoglobin: 1.9 % — ABNORMAL HIGH (ref 0.5–1.5)
Methemoglobin: 1.1 % (ref 0.0–1.5)
O2 Saturation: 86.4 %
Total hemoglobin: 8.2 g/dL — ABNORMAL LOW (ref 12.0–16.0)

## 2023-07-26 LAB — HEPATIC FUNCTION PANEL
ALT: 25 U/L (ref 0–44)
AST: 49 U/L — ABNORMAL HIGH (ref 15–41)
Albumin: 2.2 g/dL — ABNORMAL LOW (ref 3.5–5.0)
Alkaline Phosphatase: 128 U/L — ABNORMAL HIGH (ref 38–126)
Bilirubin, Direct: 0.3 mg/dL — ABNORMAL HIGH (ref 0.0–0.2)
Indirect Bilirubin: 0.7 mg/dL (ref 0.3–0.9)
Total Bilirubin: 1 mg/dL (ref 0.0–1.2)
Total Protein: 7.3 g/dL (ref 6.5–8.1)

## 2023-07-26 LAB — MAGNESIUM: Magnesium: 2.2 mg/dL (ref 1.7–2.4)

## 2023-07-26 LAB — HEPARIN LEVEL (UNFRACTIONATED): Heparin Unfractionated: 0.5 [IU]/mL (ref 0.30–0.70)

## 2023-07-26 LAB — FIBRINOGEN: Fibrinogen: 629 mg/dL — ABNORMAL HIGH (ref 210–475)

## 2023-07-26 MED ORDER — MIDAZOLAM HCL 2 MG/2ML IJ SOLN
INTRAMUSCULAR | Status: AC
  Administered 2023-07-26: 2 mg via INTRAVENOUS
  Filled 2023-07-26: qty 2

## 2023-07-26 MED ORDER — OXYCODONE HCL 5 MG PO TABS: 5.0000 mg | ORAL_TABLET | Freq: Four times a day (QID) | ORAL | Status: DC | PRN

## 2023-07-26 MED ORDER — MEXILETINE HCL 250 MG PO CAPS
250.0000 mg | ORAL_CAPSULE | Freq: Two times a day (BID) | ORAL | Status: DC
  Administered 2023-07-26 – 2023-08-02 (×14): 250 mg via ORAL
  Filled 2023-07-26 (×14): qty 1

## 2023-07-26 MED ORDER — FOLIC ACID 1 MG PO TABS
1.0000 mg | ORAL_TABLET | Freq: Every day | ORAL | Status: DC
  Administered 2023-07-27 – 2023-08-04 (×8): 1 mg via ORAL
  Filled 2023-07-26 (×9): qty 1

## 2023-07-26 MED ORDER — RENA-VITE PO TABS
1.0000 | ORAL_TABLET | Freq: Every day | ORAL | Status: DC
  Administered 2023-07-26 – 2023-08-03 (×8): 1 via ORAL
  Filled 2023-07-26 (×8): qty 1

## 2023-07-26 MED ORDER — METHIMAZOLE 10 MG PO TABS
10.0000 mg | ORAL_TABLET | Freq: Every day | ORAL | Status: DC
  Administered 2023-07-27 – 2023-08-04 (×9): 10 mg via ORAL
  Filled 2023-07-26 (×9): qty 1

## 2023-07-26 MED ORDER — HYDROMORPHONE HCL 1 MG/ML IJ SOLN
1.0000 mg | INTRAMUSCULAR | Status: DC | PRN
  Administered 2023-07-27: 1 mg via INTRAVENOUS
  Filled 2023-07-26: qty 1

## 2023-07-26 MED ORDER — VITAMIN C 500 MG PO TABS
250.0000 mg | ORAL_TABLET | Freq: Two times a day (BID) | ORAL | Status: DC
  Administered 2023-07-26 – 2023-08-04 (×16): 250 mg via ORAL
  Filled 2023-07-26 (×17): qty 1

## 2023-07-26 MED ORDER — ONDANSETRON HCL 4 MG/2ML IJ SOLN
4.0000 mg | Freq: Four times a day (QID) | INTRAMUSCULAR | Status: DC | PRN
Start: 2023-07-26 — End: 2023-08-04
  Administered 2023-07-26 – 2023-08-04 (×4): 4 mg via INTRAVENOUS
  Filled 2023-07-26 (×5): qty 2

## 2023-07-26 MED ORDER — ACETAMINOPHEN 325 MG PO TABS
650.0000 mg | ORAL_TABLET | Freq: Four times a day (QID) | ORAL | Status: DC
  Administered 2023-07-26 – 2023-07-29 (×10): 650 mg via ORAL
  Filled 2023-07-26 (×10): qty 2

## 2023-07-26 MED ORDER — PROPOFOL 1000 MG/100ML IV EMUL: 5.0000 ug/kg/min | INTRAVENOUS | Status: DC

## 2023-07-26 MED ORDER — AMIODARONE HCL 200 MG PO TABS
200.0000 mg | ORAL_TABLET | Freq: Two times a day (BID) | ORAL | Status: DC
  Administered 2023-07-26 – 2023-07-30 (×9): 200 mg via ORAL
  Filled 2023-07-26 (×9): qty 1

## 2023-07-26 MED ORDER — METOCLOPRAMIDE HCL 5 MG/ML IJ SOLN
10.0000 mg | Freq: Three times a day (TID) | INTRAMUSCULAR | Status: DC
  Administered 2023-07-26 – 2023-07-28 (×6): 10 mg via INTRAVENOUS
  Filled 2023-07-26 (×6): qty 2

## 2023-07-26 MED ORDER — ASPIRIN 81 MG PO CHEW
81.0000 mg | CHEWABLE_TABLET | Freq: Every day | ORAL | Status: DC
  Administered 2023-07-27 – 2023-08-04 (×9): 81 mg via ORAL
  Filled 2023-07-26 (×10): qty 1

## 2023-07-26 MED ORDER — FENTANYL 2500MCG IN NS 250ML (10MCG/ML) PREMIX INFUSION: 0.0000 ug/h | INTRAVENOUS | Status: DC

## 2023-07-26 MED ORDER — MIDAZOLAM HCL 2 MG/2ML IJ SOLN
2.0000 mg | INTRAMUSCULAR | Status: DC | PRN
  Administered 2023-07-27 (×3): 2 mg via INTRAVENOUS
  Filled 2023-07-26 (×3): qty 2

## 2023-07-26 NOTE — Progress Notes (Signed)
 NAME:  Aaron [name], MRN:  [id number], DOB:  11/18/1985, LOS: 17 ADMISSION DATE:  07/09/2023, CONSULTATION DATE:  07/11/2023 REFERRING MD: Clearnce Hasten, CHIEF COMPLAINT: Status post cardiac arrest  History of Present Illness:  38 year old male with hypothyroidism, paroxysmal A-fib, severe mitral regurgitation who presented to [facility] long hospital with che st pain and shortness of breath for few days associated cough, nausea and diarrhea.  In the emergency department he was diagnosed with PE, was started on IV heparin.  He takes beta-blocker, steroid and methimazole for hyperthyroidism.  On 2/27 patient rapidly deteriorated developed V. tach/V-fib cardiac arrest patient was intubated after 40 minutes of CPR, cardiology was consulted patient was placed on VA ECMO and was transferred to [facility]  Pertinent  Medical History   Past Medical History:  Diagnosis Date   Atrial fibrillation [facility])    Hyperthyroidism    Mitral regurgitation    NSTEMI (non-ST elevated myocardial infarction) (HCC)     Significant Hospital Events: Including procedures, antibiotic start and stop dates in addition to other pertinent events   3/5 salvage TEER (transcatheter edge to edge mitral valve repair 3/7 weaned off [facility] 3/8 bronch for mucus plugging 3/12 impella removed, extubation trial 3/13 reintubated, bronch for recurrent plugging, HD cath rewired due to malfunction  Interim History / Subjective:  No events, weaning sedation.  Objective   Blood pressure 139/81, pulse 71, temperature 99.3 F (37.4 C), temperature source Oral, resp. rate 13, height 6\' 4"  (1.93 m), weight 73.8 kg, SpO2 100%. CVP:  [1 mmHg-14 mmHg] 5 mmHg  Vent Mode: PRVC FiO2 (%):  [40 %] 40 % Set Rate:  [20 bmp] 20 bmp Vt Set:  [580 mL] 580 mL PEEP:  [8 cmH20] 8 cmH20 Plateau Pressure:  [20 cmH20-21 cmH20] 20 cmH20   Intake/Output Summary (Last 24 hours) at 07/26/2023 0841 Last data filed at 07/26/2023 0700 Gross per 24 hour   Intake 3866.27 ml  Output 4370.3 ml  Net -504.03 ml   Filed Weights   07/22/23 0500 07/23/23 0500 07/26/23 0500  Weight: 90.2 kg 87.4 kg 73.8 kg    Examination: No distress Withdraws to pain Still not following commands Not much fluid left on board Old ECMO site looks fine  WBC markedly improved Stable anemia  Resolved Hospital Problem list   Lactic acidosis, resolved Refractory hyperkalemia, improving  Assessment & Plan:  Baseline severe mitral insufficiency with superimposed RV>LV shock state culminating in prolonged IHCA s/p VA ECMO 2/27- salvage TEER 3/5, weaned from [place] to impella 3/7.  Post arrest encephalopathy- did not follow commands post extubation; some reports that he did during ecmo run; MRI reassuring 3/13 Acute renal failure- on CRRT Thyrotoxicosis- on methimazole, stress steroids; stress steroids off on 3/8 Recurrent mucus plugging, resp failure- at baseline coughs up brown material (???) per fiance.  BAL 3/8 neg.  Failed extubation trial 3/12 due to recurrent plugging, BAL sent again. Leukocytosis- suspect mostly reactive, antimicrobials don't make a difference Strokes- small likely embolic  Heparin goal Xa 3-7 DC abx and monitor fever/WBC curve: I think his secretions are non-infectious Continue triple neb therapy Amio now PO CRRT goal even; to stop once current filter flots Weaning off ketamine and fent today, keep precedex, want him awake and following commands if possible prior to this extubation trial F/u BAL culture: still no growth Thyroid labs reviewed: reduce methimazole on 3/14 and recheck in 1 week (3/21) QTc fine: continue amio  Will need a lot of rehab  Best  Practice (right click and "Reselect all SmartList Selections" daily)   Diet/type: NPO TF DVT prophylaxis heparin Pressure ulcer(s): sacrum, turn PRN GI prophylaxis: PPI Lines: Central line, Arterial Line, and yes and it is still needed.   Foley:  Yes, and it is still needed Code  Status:  full code Last date of multidisciplinary goals of care discussion [updated]  31 min cc time Myrla Halsted MD PCCM

## 2023-07-26 NOTE — Progress Notes (Signed)
 [facility] NEPHROLOGY PROGRESS NOTE  Assessment/ Plan: Pt is Aaron 38 y.o. yo male  likely ATN after cardiac arrest, cardiogenic shock on ECMO ( decan 3/8) and Impella ( removed 3/12), AHRF/VDRF   # Dialysis dependent anuric AKI 2/2 ATN from cardiogenic shock on CRRT since 07/11/23, Left subclavian temp HD cath placed by CCM;  Tolerating CRRT well however getting interruptions because of procedures, imaging studies as well as clotting.  The potassium level was running high --  CRRT prescription was changed yesterday. Now K in the 3's-  change again to 4 K pre and post and cont 2 K dialysate-  UF around 50 / hour-  volume does not seem terrible.   Clotted this AM-  variety of factors-  re set up with more heparin   Given better hemodynamics will plan to transition from CRRT-  the next time it clots.  I anticipate he will continue to need RRT in the form of IHD but try to make it til Monday to do  # Cardiogenic Shock,  Impella removed; status post transcutaneous mitral valve repair; previously on ECMO. per cardiology.  # VT + PEA SCA, as per cardiology team. # PE on heparin per CCM # Anemia, Hb  dropping--  added ESA  # Hyperkalemia, less present .  Existing CRRT. # Severe MR s/p TEER 3/5 # Hypophosphatemia due to CRRT:  No repletion needed today   Subjective:  Remains intubated and sedated.  No urine output.recorded.  CRRT continues to have clotting problems-  negative only 500 with CRRT-   no pressors   Objective Vital signs in last 24 hours: Vitals:   07/26/23 0620 07/26/23 0630 07/26/23 0645 07/26/23 0700  BP: 131/71 127/66 131/74   Pulse: 67 67 67 69  Resp: 20 20 20 20   Temp:      TempSrc:      SpO2: 100% 100% 100% 100%  Weight:      Height:       Weight change:   Intake/Output Summary (Last 24 hours) at 07/26/2023 0733 Last data filed at 07/26/2023 0700 Gross per 24 hour  Intake 3999.78 ml  Output 4509.2 ml  Net -509.42 ml       Labs: RENAL PANEL Recent  Labs  Lab 07/22/23 0412 07/22/23 0415 07/23/23 0414 07/23/23 0415 07/24/23 0412 07/24/23 0442 07/24/23 1542 07/25/23 0208 07/25/23 0407 07/25/23 0413 07/25/23 1623 07/25/23 1643 07/25/23 1726 07/26/23 0437 07/26/23 0443  NA 132*  132*   < > 133*  132*   < > 130*   < > 134* 130* 131*   < > 133* 133* 133* 134*  133* 135  K 4.0  4.0   < > 5.3*  5.2*   < > 5.5*   < > 4.5 3.8 3.5   < > 4.0 4.1 4.1 3.9  3.8 3.9  CL 99  100   < > 98  97*   < > 98  --  99 97* 100  --  100  --   --  100  101  --   CO2 22  21*   < > 21*  21*   < > 21*  --  20* 20* 21*  --  23  --   --  21*  22  --   GLUCOSE 133*  132*   < > 117*  116*   < > 133*  --  117* 112* 121*  --  135*  --   --  150*  153*  --   BUN 54*  55*   < > 54*  54*   < > 56*  --  60* 54* 51*  --  49*  --   --  47*  46*  --   CREATININE 3.29*  3.32*   < > 3.15*  2.99*   < > 3.60*  --  4.17* 3.39* 3.29*  --  3.33*  --   --  3.39*  3.24*  --   CALCIUM 8.5*  8.4*   < > 8.7*  9.0   < > 8.3*  --  8.2* 8.0* 7.7*  --  8.0*  --   --  8.2*  8.0*  --   MG 2.6*  --  2.6*  --  2.5*  --   --  2.3  --   --   --   --   --  2.2  --   PHOS 1.8*   < > 3.9   < > 4.5  --  5.1* 4.1 4.0  --  3.9  --   --  3.8  3.8  --   ALBUMIN 2.1*  2.1*   < > 2.4*  2.4*   < > 2.2*  2.2*  --  2.1* 2.0* 1.9*  --  2.2*  --   --  2.2*  2.1*  --    < > = values in this interval not displayed.    Liver Function Tests: Recent Labs  Lab 07/24/23 0412 07/24/23 1542 07/25/23 0208 07/25/23 0407 07/25/23 1623 07/26/23 0437  AST 61*  --  49*  --   --  49*  ALT 25  --  23  --   --  25  ALKPHOS 147*  --  118  --   --  128*  BILITOT 1.5*  --  1.3*  --   --  1.0  PROT 7.6  --  6.7  --   --  7.3  ALBUMIN 2.2*  2.2*   < > 2.0* 1.9* 2.2* 2.2*  2.1*   < > = values in this interval not displayed.   No results for input(s): "LIPASE", "AMYLASE" in the last 168 hours. Recent Labs  Lab 07/24/23 1155  AMMONIA 63*   CBC: Recent Labs    07/25/23 0407  07/25/23 0413 07/25/23 0935 07/25/23 1643 07/25/23 1726 07/26/23 0437 07/26/23 0443  HGB 7.4*   < > 7.2* 9.5* 8.5* 7.5* 8.2*  MCV 92.8  --   --   --   --  93.7  --    < > = values in this interval not displayed.    Cardiac Enzymes: No results for input(s): "CKTOTAL", "CKMB", "CKMBINDEX", "TROPONINI" in the last 168 hours. CBG: Recent Labs  Lab 07/25/23 1250 07/25/23 1639 07/25/23 1953 07/25/23 2335 07/26/23 0440  GLUCAP 97 141* 148* 131* 143*    Iron Studies: No results for input(s): "IRON", "TIBC", "TRANSFERRIN", "FERRITIN" in the last 72 hours. Studies/Results: DG Chest Port 1 View Result Date: 07/25/2023 CLINICAL DATA:  ET tube placement EXAM: PORTABLE CHEST 1 VIEW COMPARISON:  X-ray 07/24/2023 and older FINDINGS: Stable ET tube, enteric tube, right-sided PICC, left IJ line and left subclavian line. Metallic foci overlie the left side of the heart as well. There are surgical clips and skin staples along the right shoulder, axillary region. Stable cardiopericardial silhouette with vascular congestion. Persistent left retrocardiac opacity. No pneumothorax. The inferior costophrenic angles also show Aaron possible small left effusion. IMPRESSION: No  significant interval change when adjusted for technique Electronically Signed   By: Karen Kays M.D.   On: 07/25/2023 10:13   DG Chest Port 1 View Result Date: 07/24/2023 CLINICAL DATA:  Status post central line placement. EXAM: PORTABLE CHEST 1 VIEW COMPARISON:  July 24, 2023 (1:24 Aaron.m.) FINDINGS: Since the prior study there is been further advancement of the previously noted left-sided subclavian catheter. Its distal tip now sits within the distal aspect of the superior vena cava. The additional catheters, ET tube and orogastric tube seen on the prior study are unchanged in position. Since the prior exam there is improved aeration of the left upper lobe with persistent mild to moderate severity bilateral left perihilar and right infrahilar  infiltrates. Moderate severity left basilar atelectasis and/or infiltrate is seen within the retrocardiac region of the left lung base. There is Aaron small left pleural effusion. No pneumothorax is identified. The visualized skeletal structures are unremarkable. IMPRESSION: 1. Interval advancement of the left-sided subclavian catheter with its distal tip now within the distal aspect of the superior vena cava. 2. Improved aeration of the left upper lobe with persistent bilateral left perihilar and right infrahilar infiltrates. 3. Moderate severity left basilar atelectasis and/or infiltrate. 4. Small left pleural effusion. Electronically Signed   By: Aram Candela M.D.   On: 07/24/2023 19:34   MR BRAIN WO CONTRAST Result Date: 07/24/2023 CLINICAL DATA:  Anoxic brain damage.  Cardiac arrest EXAM: MRI HEAD WITHOUT CONTRAST TECHNIQUE: Multiplanar, multiecho pulse sequences of the brain and surrounding structures were obtained without intravenous contrast. COMPARISON:  Head CT 07/10/2023 FINDINGS: Brain: Small acute infarcts in the right cerebral white matter and peripheral right cerebellum. No indication of superimposed global anoxic injury. No hydrocephalus, mass, or collection. Minimal blood products at right occipital white matter infarction attributed to petechial hemorrhage in this setting. Vascular: Major flow voids are preserved Skull and upper cervical spine: Normal marrow signal Sinuses/Orbits: Extensive bilateral mastoid opacification in the setting of intubation. IMPRESSION: Small acute infarcts in the right cerebral and cerebellar hemispheres. No generalized finding to implicate global anoxic injury. Electronically Signed   By: Tiburcio Pea M.D.   On: 07/24/2023 10:33    Medications: Infusions:  dexmedetomidine (PRECEDEX) IV infusion 0.2 mcg/kg/hr (07/26/23 0700)   feeding supplement (VITAL 1.5 CAL) 75 mL/hr at 07/26/23 0700   fentaNYL infusion INTRAVENOUS 100 mcg/hr (07/26/23 0700)   heparin  10,000 units/ 20 mL infusion syringe 600 Units/hr (07/26/23 0454)   heparin 1,950 Units/hr (07/26/23 0700)   ketamine (KETALAR) adult infusion 1 mg/kg/hr (07/26/23 0700)   meropenem (MERREM) IV Stopped (07/26/23 0557)   norepinephrine (LEVOPHED) Adult infusion Stopped (07/25/23 1313)   PrismaSol BGK 2/3.5 1,500 mL/hr at 07/26/23 0527   prismasol BGK 4/2.5 600 mL/hr at 07/26/23 0650   prismasol BGK 4/2.5 400 mL/hr at 07/25/23 2311   propofol (DIPRIVAN) infusion Stopped (07/25/23 1356)   vasopressin Stopped (07/25/23 0238)    Scheduled Medications:  acetaminophen  650 mg Per Tube Q6H   amiodarone  200 mg Per Tube BID   arformoterol  15 mcg Nebulization BID   ascorbic acid  250 mg Per Tube BID   aspirin  81 mg Per Tube Daily   budesonide (PULMICORT) nebulizer solution  0.25 mg Nebulization BID   Chlorhexidine Gluconate Cloth  6 each Topical Daily   clonazePAM  1 mg Per Tube BID   darbepoetin (ARANESP) injection - DIALYSIS  200 mcg Subcutaneous Q Fri-1800   feeding supplement (PROSource TF20)  60 mL  Per Tube BID   fiber supplement (BANATROL TF)  60 mL Per Tube BID   folic acid  1 mg Per Tube Daily   insulin aspart  0-20 Units Subcutaneous Q4H   lactulose  30 g Per Tube BID   methimazole  10 mg Per Tube Daily   mexiletine  250 mg Per Tube Q12H   multivitamin  1 tablet Per Tube QHS   mupirocin ointment  1 Application Nasal BID   mouth rinse  15 mL Mouth Rinse Q2H   oxyCODONE  5 mg Per Tube Q6H   pantoprazole (PROTONIX) IV  40 mg Intravenous QHS   revefenacin  175 mcg Nebulization Daily   sodium chloride flush  10-40 mL Intracatheter Q12H   sodium chloride flush  3 mL Intravenous Q12H   thiamine  100 mg Per Tube Daily   Or   thiamine  100 mg Intravenous Daily   zinc sulfate (50mg  elemental zinc)  220 mg Oral Daily    have reviewed scheduled and prn medications.  Physical Exam: General: Critically ill looking male, on CRRT. Heart:RRR, s1s2 nl Lungs: Intubated,  sedated. Abdomen:soft, non-distended Extremities:No edema Dialysis Access: Left subclavian temporary HD line.-  newest line placed 3/9  Aaron [name] Aaron [name] 07/26/2023,7:33 AM  LOS: 17 days

## 2023-07-26 NOTE — Progress Notes (Signed)
 Pt placed on PSV/CPAP and tolerating well at this time

## 2023-07-26 NOTE — Progress Notes (Addendum)
 PHARMACY - ANTICOAGULATION CONSULT NOTE  Pharmacy Consult for Heparin  Indication: pulmonary embolus, s/p ECMO decannulation 3/7, Impella removal 3/12  No Known Allergies  Patient Measurements: Height: 6\' 4"  (193 cm) Weight: 73.8 kg (162 lb 11.2 oz) IBW/kg (Calculated) : 86.8 Heparin Dosing Weight: n/a  Vital Signs: Temp: 99.3 F (37.4 C) (03/15 0400) Temp Source: Oral (03/15 0400) BP: 123/68 (03/15 0600) Pulse Rate: 68 (03/15 0615)  Labs: Recent Labs    07/24/23 0412 07/24/23 0432 07/24/23 2010 07/25/23 0208 07/25/23 0407 07/25/23 0413 07/25/23 0935 07/25/23 1623 07/25/23 1643 07/25/23 1726 07/26/23 0437 07/26/23 0443  HGB  --    < > 7.3*  --  7.4*   < > 7.2*  --    < > 8.5* 7.5* 8.2*  HCT  --    < > 22.8*  --  23.3*   < > 23.0*  --    < > 25.0* 23.8* 24.0*  PLT  --    < > 242  --  234  --   --   --   --   --  185  --   APTT 44*  --   --   --   --   --   --   --   --   --   --   --   HEPARINUNFRC  --    < >  --  0.37  --   --  0.49  --   --   --  0.50  --   CREATININE 3.60*   < >  --  3.39* 3.29*  --   --  3.33*  --   --  3.39*  3.24*  --    < > = values in this interval not displayed.    Estimated Creatinine Clearance: 32.3 mL/min (A) (by C-G formula based on SCr of 3.24 mg/dL (H)).   Medical History: Past Medical History:  Diagnosis Date   Atrial fibrillation (HCC)    Hyperthyroidism    Mitral regurgitation    NSTEMI (non-ST elevated myocardial infarction) [facility])     Assessment: 38 yo M with bilateral PE s/p TNK (2/27 @1156 ) now on [place] ECMO + Impella. Pharmacy consulted for bivalirudin for anticoagulation.   S/p impella CP >5.5 3/3 - bivalirudin was previously held with oozing at insertion site but restart 3/4. Patient underwent mitraclip 3/5.  Bival transitioned to heparin 3/7 after decannulation.  Impella 5.5 removed 3/13.  Heparin to restarted 3/13. Patient also receiving heparin in CRRT circuit; increased from 500 to 600 units/hr last night given has  been clotting off CRRT filter.   Heparin level is therapeutic at 0.5 on UFH 1950 units/hour. No signs of bleeding. CBC, fibrinogen, and LDH stable.  Goal of Therapy:  Heparin level 0.3-0.7 units/mL Monitor platelets by anticoagulation protocol: Yes   Plan:  Continue heparin at 1950 units/hr (continue 600 units/hr in CRRT circuit) Daily anti-Xa and CBC, monitor s/sx bleeding   Thank you for allowing pharmacy to participate in this patient's care,  Wilmer Floor, PharmD PGY2 Cardiology Pharmacy Resident 07/26/2023 6:29 AM

## 2023-07-26 NOTE — Progress Notes (Signed)
 Placed back on full support at this time due to vomiting episode

## 2023-07-26 NOTE — Progress Notes (Signed)
 Orthopedic Tech Progress Note Patient Details:  Aaron [name] March 22, 1986 [id number]  Unna boots applied to BLE. These will need to be replaced on Monday, 3/17.  Ortho Devices Type of Ortho Device: Radio broadcast assistant Ortho Device/Splint Location: BLE Ortho Device/Splint Interventions: Ordered, Application, Adjustment   Post Interventions Patient Tolerated: Well Instructions Provided: Care of device  Nanette Wirsing Carmine Savoy 07/26/2023, 6:12 PM

## 2023-07-26 NOTE — Progress Notes (Signed)
 Patient ID: Aaron [name], male   DOB: 13-Mar-1986, 38 y.o.   MRN: [id number]     Advanced Heart Failure Rounding Note  Cardiologist: Christell Constant, MD  Chief Complaint: V-A ECMO  Subjective:    Admitted 2/26 with worsening shortness of breath, chest pain, atrial fibrillation with RVR. Decompensated 2/27 with IVF, nodal blockade with subsequent respiratory arrest, ROSC achieved after ~16 minute down time Femoral VA ecmo cannulation 2/27 with Impella CP vent ECMO decannulation 3/8 Impella removed and extubated 3/12.  Had to be reintubated with mucus plugging and left lung opacification.  MRI head 3/13 with small infarcts right cerebral and cerebellar hemispheres  CXR shows improved left lung. He remains on vent.   Patient is off milrinone and pressors, co-ox 86%. CVVH ongoing, 50 cc/hr net negative UF with I/Os -507. CVP 3 today.   He remains on per tube mexiletine and amiodarone.  Remains in NSR this morning.   Afebrile with WBCs 26 => 30 => 42 => 47 => 31 => 16.7.  He is on meropenem.   Weaning sedation. Responds to suctioning but nothing else so far.   Echo (3/10): EF 35-40%, anterior and septal HK, normal RV, s/p Mitraclip with mild MR and mean gradient 7 mmHg.   Objective:   Weight Range: 73.8 kg Body mass index is 19.8 kg/m.   Vital Signs:   Temp:  [97.9 F (36.6 C)-99.4 F (37.4 C)] 99.3 F (37.4 C) (03/15 0400) Pulse Rate:  [60-98] 69 (03/15 0700) Resp:  [17-23] 20 (03/15 0700) BP: (105-168)/(49-97) 131/74 (03/15 0645) SpO2:  [100 %] 100 % (03/15 0700) Arterial Line BP: (107-235)/(39-98) 175/65 (03/15 0700) FiO2 (%):  [40 %] 40 % (03/15 0333) Weight:  [73.8 kg] 73.8 kg (03/15 0500) Last BM Date : 07/25/23  Weight change: Filed Weights   07/22/23 0500 07/23/23 0500 07/26/23 0500  Weight: 90.2 kg 87.4 kg 73.8 kg    Intake/Output:   Intake/Output Summary (Last 24 hours) at 07/26/2023 0759 Last data filed at 07/26/2023 0700 Gross per 24 hour  Intake  3999.78 ml  Output 4370.3 ml  Net -370.52 ml      Physical Exam    General: Intubated Neck: No JVD, no thyromegaly or thyroid nodule.  Lungs: Clear to auscultation bilaterally with normal respiratory effort. CV: Nondisplaced PMI.  Heart regular S1/S2, no S3/S4, no murmur.  No peripheral edema.   Abdomen: Soft, nontender, no hepatosplenomegaly, no distention.  Skin: Intact without lesions or rashes.  Neurologic: Responds to suctioning. Extremities: No clubbing or cyanosis.  HEENT: Normal.    Telemetry   NSR 80s, no VT (personally reviewed)  Labs    CBC Recent Labs    07/25/23 0407 07/25/23 0413 07/26/23 0437 07/26/23 0443  WBC 30.7*  --  16.7*  --   HGB 7.4*   < > 7.5* 8.2*  HCT 23.3*   < > 23.8* 24.0*  MCV 92.8  --  93.7  --   PLT 234  --  185  --    < > = values in this interval not displayed.   Basic Metabolic Panel Recent Labs    40/98/11 0208 07/25/23 0407 07/25/23 1623 07/25/23 1643 07/26/23 0437 07/26/23 0443  NA 130*   < > 133*   < > 134*  133* 135  K 3.8   < > 4.0   < > 3.9  3.8 3.9  CL 97*   < > 100  --  100  101  --  CO2 20*   < > 23  --  21*  22  --   GLUCOSE 112*   < > 135*  --  150*  153*  --   BUN 54*   < > 49*  --  47*  46*  --   CREATININE 3.39*   < > 3.33*  --  3.39*  3.24*  --   CALCIUM 8.0*   < > 8.0*  --  8.2*  8.0*  --   MG 2.3  --   --   --  2.2  --   PHOS 4.1   < > 3.9  --  3.8  3.8  --    < > = values in this interval not displayed.   Liver Function Tests Recent Labs    07/25/23 0208 07/25/23 0407 07/25/23 1623 07/26/23 0437  AST 49*  --   --  49*  ALT 23  --   --  25  ALKPHOS 118  --   --  128*  BILITOT 1.3*  --   --  1.0  PROT 6.7  --   --  7.3  ALBUMIN 2.0*   < > 2.2* 2.2*  2.1*   < > = values in this interval not displayed.    BNP: BNP (last 3 results) Recent Labs    07/09/23 1934 07/10/23 1224  BNP 703.7* 980.5*   Thyroid Function Tests Recent Labs    07/25/23 0208  TSH 0.033*       Medications:     Scheduled Medications:  acetaminophen  650 mg Per Tube Q6H   amiodarone  200 mg Per Tube BID   arformoterol  15 mcg Nebulization BID   ascorbic acid  250 mg Per Tube BID   aspirin  81 mg Per Tube Daily   budesonide (PULMICORT) nebulizer solution  0.25 mg Nebulization BID   Chlorhexidine Gluconate Cloth  6 each Topical Daily   clonazePAM  1 mg Per Tube BID   darbepoetin (ARANESP) injection - DIALYSIS  200 mcg Subcutaneous Q Fri-1800   feeding supplement (PROSource TF20)  60 mL Per Tube BID   fiber supplement (BANATROL TF)  60 mL Per Tube BID   folic acid  1 mg Per Tube Daily   insulin aspart  0-20 Units Subcutaneous Q4H   lactulose  30 g Per Tube BID   methimazole  10 mg Per Tube Daily   mexiletine  250 mg Per Tube Q12H   multivitamin  1 tablet Per Tube QHS   mupirocin ointment  1 Application Nasal BID   mouth rinse  15 mL Mouth Rinse Q2H   oxyCODONE  5 mg Per Tube Q6H   pantoprazole (PROTONIX) IV  40 mg Intravenous QHS   revefenacin  175 mcg Nebulization Daily   sodium chloride flush  10-40 mL Intracatheter Q12H   sodium chloride flush  3 mL Intravenous Q12H   thiamine  100 mg Per Tube Daily   Or   thiamine  100 mg Intravenous Daily   zinc sulfate (50mg  elemental zinc)  220 mg Oral Daily    Infusions:  dexmedetomidine (PRECEDEX) IV infusion 0.2 mcg/kg/hr (07/26/23 0700)   feeding supplement (VITAL 1.5 CAL) 75 mL/hr at 07/26/23 0700   fentaNYL infusion INTRAVENOUS 100 mcg/hr (07/26/23 0700)   heparin 10,000 units/ 20 mL infusion syringe 600 Units/hr (07/26/23 0632)   heparin 1,950 Units/hr (07/26/23 0700)   ketamine (KETALAR) adult infusion 1 mg/kg/hr (07/26/23 0700)   meropenem (MERREM) IV  Stopped (07/26/23 0557)   norepinephrine (LEVOPHED) Adult infusion Stopped (07/25/23 1313)   PrismaSol BGK 2/3.5 2,000 mL/hr at 07/26/23 0747   prismasol BGK 4/2.5 600 mL/hr at 07/26/23 0650   prismasol BGK 4/2.5 400 mL/hr at 07/25/23 2311   propofol  (DIPRIVAN) infusion Stopped (07/25/23 1356)   vasopressin Stopped (07/25/23 0238)    PRN Medications: docusate, fentaNYL, heparin, HYDROmorphone, labetalol, midazolam, mouth rinse, polyethylene glycol, sodium chloride flush    Patient Profile   Patient with a history of Grave's disease, severe primary mitral regurgitation who presented with chest pain and segmental PE. Subsequent progressed to SCAI Stage E shock requiring VA ECMO cannulation on 2/27.   Assessment/Plan   SCAI Stage E Cardiogenic shock - Suspect hypoxic respiratory failure driven with segmental PE on top of severe MR, nodal blockage, and thyrotoxicosis - Down time ~ 20 minutes, good mental status confirmed post code - We performed a mini-turn down on 07/15/23 at bedside; ECMO flows were reduced in a stepwise pattern by 0.5LPM to a goal of 2.5LPM. During this time, impella 5.5 flows increased to maintain net even flows. With reduction of ECMO flows, patient had onset of frequent PVCs/NSVT requiring amio boluses. In addition, despite increased LV unloading patient continued to have severe 4+ MR with progressive RV failure on bedside TTE.  - Underwent successful mTEER on 07/16/23 with placement of x3 clips and improvement in MR from severe to mild. Mean mitral gradient of 3-18mmHg.  - Decannulated from V-A ECMO by Dr. Karin Lieu on 07/19/23 with vascular cut down and primary repair of left femoral artery.  - Echo on 3/10 showed EF 35-40%, anterior and septal HK, normal RV, s/p Mitraclip with mild MR and mean gradient 7 mmHg.  - Impella removed 3/12.  - CVVH ongoing, net negative 50 cc/hr.  CVP 3 today. Can run CVVH even today, can stop when filter clots and anticipate iHD on Monday.    - MAP and co-ox stable off milrinone and pressors.   Severe mitral valve regurgitation - Noted on previous echocardiogram - Now s/p mTEER on 07/16/23; improvement in MR from severe to mild.  - No murmur on exam  Pulmonary hypertension - PA pressures  reached 80s/30s with PCWP of ~10, TPG of ~39mmHg suggestive of PVR >5 on 07/17/22 - He is now off NO, swan is out. Overall data is not supportive with the use of sildenafil in fixed PH secondary to MS/MR.   VT/NSVT - Frequent runs of NSVT likely secondary to impella 5.5 position. Impella now out.  - Now on mexiletine and amiodarone per tube.   - No further VT.   ID - Still on vancomycin/meropenem - Started on micafungin on 07/19/23 - WBCs 22.8 => 26 => 30 => 42 => 47 => 31 => 16.7, afebrile.  - Bronchoscopies with thick brown secretions but no culture data.  For now, continue current regimen.  - CXR improved  Anemia - Hgb 7.5 today, transfuse hgb < 7.   Acute renal failure/hyperkalemia - CVVH ongoing, continue net negative UF aim for even to -50 cc/hr today with CVP 5.   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   Atrial fibrillation - Present on arrival, in the setting of PE and thyroid disease - sinus rhythm today.  - Transition to amiodarone per tube.   Pulmonary embolism:  - Segmental without right heart strain -  Heparin gtt.    CAD - Prior embolic infarct in the LAD territory with corresponding LGE on CMR - Lifelong anticoagulation - On heparin gtt here.   Acute hypoxemic respiratory failure - Extubated and reintubated 3/12.   - Thick secretions with complete opacification of left lung, now CXR improved with bronchoscopies and suction.   - Vent per CCM.    CVA - MRI head 3/13 with small infarcts right cerebral and cerebellar hemispheres. This should not significantly affect consciousness.  - Suspect embolic, on heparin gtt.  - Still minimally responsive, continue to wean sedation.   He now is stable from cardiac standpoint, will see again Monday unless called.   CRITICAL CARE Performed by: Marca Ancona  Total critical care time: 35  minutes  Critical care time was exclusive of separately billable procedures and treating other patients.  Critical care was necessary to treat or prevent imminent or life-threatening deterioration.  Critical care was time spent personally by me on the following activities: development of treatment plan with patient and/or surrogate as well as nursing, discussions with consultants, evaluation of patient's response to treatment, examination of patient, obtaining history from patient or surrogate, ordering and performing treatments and interventions, ordering and review of laboratory studies, ordering and review of radiographic studies, pulse oximetry and re-evaluation of patient's condition.   Length of Stay: 17  Marca Ancona, MD  07/26/2023, 7:59 AM  Advanced Heart Failure Team Pager [phone] (M-F; 7a - 5p)  Please contact [facility] for night-coverage after hours (5p -7a ) and weekends on [email or web address]

## 2023-07-26 NOTE — Progress Notes (Signed)
 Ketamine gtt waste 100 cc witnessed by second RN Althea Grimmer.

## 2023-07-26 NOTE — Progress Notes (Signed)
 eLink Physician-Brief Progress Note Patient Name: Aaron [name] DOB: 1985-06-27 MRN: [id number]   Date of Service  07/26/2023  HPI/Events of Note  Patient wide awake now and attempting to pull at everything, asking for restraints but BSRN asking what can they use for sedation, RN was told with CRRT off that they really didn't want to resume sedation  BSRN resumed dexmedetomidine Seen settled down with family at bedside  eICU Interventions  Midazolam and Dilaudid ordered as bolus vis infusion. Switch these to PRN IV push to avoid drips     Intervention Category Minor Interventions: Agitation / anxiety - evaluation and management  Darl Pikes 07/26/2023, 11:26 PM

## 2023-07-26 NOTE — Plan of Care (Signed)
  Problem: Clinical Measurements: Goal: Ability to maintain clinical measurements within normal limits will improve Outcome: Progressing   Problem: Nutrition: Goal: Adequate nutrition will be maintained Outcome: Progressing   Problem: Coping: Goal: Level of anxiety will decrease Outcome: Progressing   Problem: Elimination: Goal: Will not experience complications related to bowel motility Outcome: Progressing   Problem: Pain Managment: Goal: General experience of comfort will improve and/or be controlled Outcome: Progressing   Problem: Fluid Volume: Goal: Ability to maintain a balanced intake and output will improve Outcome: Progressing   Problem: Metabolic: Goal: Ability to maintain appropriate glucose levels will improve Outcome: Progressing   Problem: Nutritional: Goal: Maintenance of adequate nutrition will improve Outcome: Progressing   Problem: Activity: Goal: Risk for activity intolerance will decrease Outcome: Not Progressing   Problem: Coping: Goal: Ability to adjust to condition or change in health will improve Outcome: Not Progressing

## 2023-07-27 DIAGNOSIS — R079 Chest pain, unspecified: Secondary | ICD-10-CM | POA: Diagnosis not present

## 2023-07-27 DIAGNOSIS — E875 Hyperkalemia: Secondary | ICD-10-CM | POA: Diagnosis not present

## 2023-07-27 DIAGNOSIS — I469 Cardiac arrest, cause unspecified: Secondary | ICD-10-CM | POA: Diagnosis not present

## 2023-07-27 DIAGNOSIS — R570 Cardiogenic shock: Secondary | ICD-10-CM | POA: Diagnosis not present

## 2023-07-27 DIAGNOSIS — J9601 Acute respiratory failure with hypoxia: Secondary | ICD-10-CM | POA: Diagnosis not present

## 2023-07-27 DIAGNOSIS — N170 Acute kidney failure with tubular necrosis: Secondary | ICD-10-CM | POA: Diagnosis not present

## 2023-07-27 DIAGNOSIS — E872 Acidosis, unspecified: Secondary | ICD-10-CM | POA: Diagnosis not present

## 2023-07-27 DIAGNOSIS — R0602 Shortness of breath: Secondary | ICD-10-CM | POA: Diagnosis not present

## 2023-07-27 LAB — RENAL FUNCTION PANEL
Albumin: 2.1 g/dL — ABNORMAL LOW (ref 3.5–5.0)
Albumin: 2.1 g/dL — ABNORMAL LOW (ref 3.5–5.0)
Anion gap: 11 (ref 5–15)
Anion gap: 16 — ABNORMAL HIGH (ref 5–15)
BUN: 75 mg/dL — ABNORMAL HIGH (ref 6–20)
BUN: 90 mg/dL — ABNORMAL HIGH (ref 6–20)
CO2: 20 mmol/L — ABNORMAL LOW (ref 22–32)
CO2: 16 mmol/L — ABNORMAL LOW (ref 22–32)
Calcium: 7.8 mg/dL — ABNORMAL LOW (ref 8.9–10.3)
Calcium: 7.9 mg/dL — ABNORMAL LOW (ref 8.9–10.3)
Chloride: 102 mmol/L (ref 98–111)
Chloride: 102 mmol/L (ref 98–111)
Creatinine, Ser: 5.8 mg/dL — ABNORMAL HIGH (ref 0.61–1.24)
Creatinine, Ser: 7.4 mg/dL — ABNORMAL HIGH (ref 0.61–1.24)
GFR, Estimated: 12 mL/min — ABNORMAL LOW (ref >60)
GFR, Estimated: 9 mL/min — ABNORMAL LOW (ref >60)
Glucose, Bld: 136 mg/dL — ABNORMAL HIGH (ref 70–99)
Glucose, Bld: 148 mg/dL — ABNORMAL HIGH (ref 70–99)
Phosphorus: 4.5 mg/dL (ref 2.5–4.6)
Phosphorus: 4.1 mg/dL (ref 2.5–4.6)
Potassium: 4.3 mmol/L (ref 3.5–5.1)
Potassium: 4.4 mmol/L (ref 3.5–5.1)
Sodium: 133 mmol/L — ABNORMAL LOW (ref 135–145)
Sodium: 134 mmol/L — ABNORMAL LOW (ref 135–145)

## 2023-07-27 LAB — HEPATITIS B SURFACE ANTIGEN: Hepatitis B Surface Ag: NONREACTIVE

## 2023-07-27 LAB — COOXEMETRY PANEL
Carboxyhemoglobin: 2.2 % — ABNORMAL HIGH (ref 0.5–1.5)
Methemoglobin: 1.7 % — ABNORMAL HIGH (ref 0.0–1.5)
O2 Saturation: 99.8 %
Total hemoglobin: 7.9 g/dL — ABNORMAL LOW (ref 12.0–16.0)

## 2023-07-27 LAB — POCT I-STAT 7, (LYTES, BLD GAS, ICA,H+H)
Acid-base deficit: 7 mmol/L — ABNORMAL HIGH (ref 0.0–2.0)
Bicarbonate: 18.2 mmol/L — ABNORMAL LOW (ref 20.0–28.0)
Calcium, Ion: 1.14 mmol/L — ABNORMAL LOW (ref 1.15–1.40)
HCT: 35 % — ABNORMAL LOW (ref 39.0–52.0)
Hemoglobin: 11.9 g/dL — ABNORMAL LOW (ref 13.0–17.0)
O2 Saturation: 98 %
Patient temperature: 38.2
Potassium: 4.5 mmol/L (ref 3.5–5.1)
Sodium: 135 mmol/L (ref 135–145)
TCO2: 19 mmol/L — ABNORMAL LOW (ref 22–32)
pCO2 arterial: 35.9 mmHg (ref 32–48)
pH, Arterial: 7.318 — ABNORMAL LOW (ref 7.35–7.45)
pO2, Arterial: 121 mmHg — ABNORMAL HIGH (ref 83–108)

## 2023-07-27 LAB — CBC
HCT: 24.2 % — ABNORMAL LOW (ref 39.0–52.0)
Hemoglobin: 7.6 g/dL — ABNORMAL LOW (ref 13.0–17.0)
MCH: 30.2 pg (ref 26.0–34.0)
MCHC: 31.4 g/dL (ref 30.0–36.0)
MCV: 96 fL (ref 80.0–100.0)
Platelets: 212 10*3/uL (ref 150–400)
RBC: 2.52 MIL/uL — ABNORMAL LOW (ref 4.22–5.81)
RDW: 20.5 % — ABNORMAL HIGH (ref 11.5–15.5)
WBC: 20.9 10*3/uL — ABNORMAL HIGH (ref 4.0–10.5)
nRBC: 0.2 % (ref 0.0–0.2)

## 2023-07-27 LAB — GLUCOSE, CAPILLARY
Glucose-Capillary: 120 mg/dL — ABNORMAL HIGH (ref 70–99)
Glucose-Capillary: 159 mg/dL — ABNORMAL HIGH (ref 70–99)
Glucose-Capillary: 121 mg/dL — ABNORMAL HIGH (ref 70–99)
Glucose-Capillary: 125 mg/dL — ABNORMAL HIGH (ref 70–99)
Glucose-Capillary: 130 mg/dL — ABNORMAL HIGH (ref 70–99)
Glucose-Capillary: 183 mg/dL — ABNORMAL HIGH (ref 70–99)

## 2023-07-27 LAB — BASIC METABOLIC PANEL
Anion gap: 18 — ABNORMAL HIGH (ref 5–15)
BUN: 75 mg/dL — ABNORMAL HIGH (ref 6–20)
CO2: 17 mmol/L — ABNORMAL LOW (ref 22–32)
Calcium: 8 mg/dL — ABNORMAL LOW (ref 8.9–10.3)
Chloride: 102 mmol/L (ref 98–111)
Creatinine, Ser: 5.69 mg/dL — ABNORMAL HIGH (ref 0.61–1.24)
GFR, Estimated: 12 mL/min — ABNORMAL LOW (ref >60)
Glucose, Bld: 132 mg/dL — ABNORMAL HIGH (ref 70–99)
Potassium: 4.4 mmol/L (ref 3.5–5.1)
Sodium: 137 mmol/L (ref 135–145)

## 2023-07-27 LAB — HEPARIN LEVEL (UNFRACTIONATED)
Heparin Unfractionated: 1.02 [IU]/mL — ABNORMAL HIGH (ref 0.30–0.70)
Heparin Unfractionated: 0.15 [IU]/mL — ABNORMAL LOW (ref 0.30–0.70)
Heparin Unfractionated: 0.24 [IU]/mL — ABNORMAL LOW (ref 0.30–0.70)

## 2023-07-27 LAB — HEPATIC FUNCTION PANEL
ALT: 27 U/L (ref 0–44)
AST: 46 U/L — ABNORMAL HIGH (ref 15–41)
Albumin: 2.2 g/dL — ABNORMAL LOW (ref 3.5–5.0)
Alkaline Phosphatase: 118 U/L (ref 38–126)
Bilirubin, Direct: 0.2 mg/dL (ref 0.0–0.2)
Indirect Bilirubin: 0.8 mg/dL (ref 0.3–0.9)
Total Bilirubin: 1 mg/dL (ref 0.0–1.2)
Total Protein: 6.8 g/dL (ref 6.5–8.1)

## 2023-07-27 LAB — PHOSPHORUS: Phosphorus: 4.5 mg/dL (ref 2.5–4.6)

## 2023-07-27 LAB — FIBRINOGEN: Fibrinogen: 613 mg/dL — ABNORMAL HIGH (ref 210–475)

## 2023-07-27 LAB — LACTATE DEHYDROGENASE: LDH: 447 U/L — ABNORMAL HIGH (ref 98–192)

## 2023-07-27 LAB — MAGNESIUM: Magnesium: 2.3 mg/dL (ref 1.7–2.4)

## 2023-07-27 LAB — TRIGLYCERIDES: Triglycerides: 115 mg/dL (ref <150)

## 2023-07-27 MED ORDER — CHLORHEXIDINE GLUCONATE CLOTH 2 % EX PADS
6.0000 | MEDICATED_PAD | Freq: Every day | CUTANEOUS | Status: DC
  Administered 2023-07-28 – 2023-08-04 (×8): 6 via TOPICAL

## 2023-07-27 MED ORDER — ORAL CARE MOUTH RINSE: 15.0000 mL | OROMUCOSAL | Status: DC | PRN

## 2023-07-27 MED ORDER — LORAZEPAM 2 MG/ML IJ SOLN: 1.0000 mg | INTRAMUSCULAR | Status: DC | PRN

## 2023-07-27 MED ORDER — HYDROMORPHONE HCL 1 MG/ML IJ SOLN
1.0000 mg | INTRAMUSCULAR | Status: DC | PRN
  Administered 2023-07-27: 1 mg via INTRAVENOUS
  Filled 2023-07-27: qty 1

## 2023-07-27 MED ORDER — SODIUM CHLORIDE 3 % IN NEBU
4.0000 mL | INHALATION_SOLUTION | Freq: Two times a day (BID) | RESPIRATORY_TRACT | Status: AC
  Administered 2023-07-27 – 2023-07-29 (×6): 4 mL via RESPIRATORY_TRACT
  Filled 2023-07-27 (×5): qty 4

## 2023-07-27 MED ORDER — ORAL CARE MOUTH RINSE
15.0000 mL | OROMUCOSAL | Status: DC
Start: 2023-07-27 — End: 2023-08-03
  Administered 2023-07-27 – 2023-08-03 (×23): 15 mL via OROMUCOSAL

## 2023-07-27 MED ORDER — HYDROCOD POLI-CHLORPHE POLI ER 10-8 MG/5ML PO SUER: 5.0000 mL | Freq: Every evening | ORAL | Status: DC | PRN

## 2023-07-27 MED ORDER — HEPARIN (PORCINE) 25000 UT/250ML-% IV SOLN
1900.0000 [IU]/h | INTRAVENOUS | Status: DC
  Administered 2023-07-27: 1700 [IU]/h via INTRAVENOUS
  Administered 2023-07-27: 1500 [IU]/h via INTRAVENOUS
  Administered 2023-07-28 – 2023-08-04 (×10): 1900 [IU]/h via INTRAVENOUS
  Filled 2023-07-27 (×14): qty 250

## 2023-07-27 NOTE — Progress Notes (Signed)
 [facility] NEPHROLOGY PROGRESS NOTE  Assessment/ Plan: Pt is a 38 y.o. yo male  likely ATN after cardiac arrest, cardiogenic shock on ECMO ( decan 3/8) and Impella ( removed 3/12), AHRF/VDRF   # Dialysis dependent anuric AKI 2/2 ATN from cardiogenic shock on CRRT since 07/11/23, Left subclavian temp HD cath placed by CCM;  Tolerating CRRT well however getting interruptions because of procedures, imaging studies as well as clotting.  The potassium level was running high --  CRRT prescription was changed, then K in the 3's-  change again to 4 K pre and post and cont 2 K dialysate-  UF around 50 / hour-  volume does not seem terrible.   Clotting was an issue.  CRRT was stopped on 3/15.  No UOP and numbers rising.  Will plan for IHD tomorrow 3/17   # Cardiogenic Shock,  Impella removed; status post transcutaneous mitral valve repair; previously on ECMO. per cardiology.  # VT + PEA SCA, as per cardiology team. # PE on heparin per CCM # Anemia, Hb  dropping--  added ESA, supportive care # Hyperkalemia, less present .  Existing CRRT. # Severe MR s/p TEER 3/5   Subjective:  Remains intubated but decreased sedation and he is awake.  No urine output.recorded.  CRRT stopped yesterday -  BUN up to 75 this AM   Objective Vital signs in last 24 hours: Vitals:   07/27/23 0530 07/27/23 0600 07/27/23 0630 07/27/23 0700  BP: 130/73 (!) 142/83 (!) 159/94 (!) 149/97  Pulse: 94 98 (!) 101 (!) 107  Resp: (!) 21 (!) 24 (!) 25 (!) 28  Temp:      TempSrc:      SpO2: 100% 100% 100% 100%  Weight:      Height:       Weight change: 0 kg  Intake/Output Summary (Last 24 hours) at 07/27/2023 0734 Last data filed at 07/27/2023 0700 Gross per 24 hour  Intake 2957.04 ml  Output 2289 ml  Net 668.04 ml       Labs: RENAL PANEL Recent Labs  Lab 07/23/23 0414 07/23/23 0415 07/24/23 0412 07/24/23 0442 07/25/23 0208 07/25/23 0407 07/25/23 0413 07/25/23 1623 07/25/23 1643 07/26/23 0437  07/26/23 0443 07/26/23 1609 07/26/23 1610 07/27/23 0500  NA 133*  132*   < > 130*   < > 130* 131*   < > 133*   < > 134*  133* 135 135 134* 137  133*  K 5.3*  5.2*   < > 5.5*   < > 3.8 3.5   < > 4.0   < > 3.9  3.8 3.9 4.2 4.1 4.4  4.3  CL 98  97*   < > 98   < > 97* 100  --  100  --  100  101  --   --  101 102  102  CO2 21*  21*   < > 21*   < > 20* 21*  --  23  --  21*  22  --   --  22 17*  20*  GLUCOSE 117*  116*   < > 133*   < > 112* 121*  --  135*  --  150*  153*  --   --  154* 132*  136*  BUN 54*  54*   < > 56*   < > 54* 51*  --  49*  --  47*  46*  --   --  55* 75*  75*  CREATININE 3.15*  2.99*   < > 3.60*   < > 3.39* 3.29*  --  3.33*  --  3.39*  3.24*  --   --  4.35* 5.69*  5.80*  CALCIUM 8.7*  9.0   < > 8.3*   < > 8.0* 7.7*  --  8.0*  --  8.2*  8.0*  --   --  8.1* 8.0*  7.8*  MG 2.6*  --  2.5*  --  2.3  --   --   --   --  2.2  --   --   --  2.3  PHOS 3.9   < > 4.5   < > 4.1 4.0  --  3.9  --  3.8  3.8  --   --  4.4 4.5  4.5  ALBUMIN 2.4*  2.4*   < > 2.2*  2.2*   < > 2.0* 1.9*  --  2.2*  --  2.2*  2.1*  --   --  2.2* 2.2*  2.1*   < > = values in this interval not displayed.    Liver Function Tests: Recent Labs  Lab 07/25/23 0208 07/25/23 0407 07/26/23 0437 07/26/23 1610 07/27/23 0500  AST 49*  --  49*  --  46*  ALT 23  --  25  --  27  ALKPHOS 118  --  128*  --  118  BILITOT 1.3*  --  1.0  --  1.0  PROT 6.7  --  7.3  --  6.8  ALBUMIN 2.0*   < > 2.2*  2.1* 2.2* 2.2*  2.1*   < > = values in this interval not displayed.   No results for input(s): "LIPASE", "AMYLASE" in the last 168 hours. Recent Labs  Lab 07/24/23 1155  AMMONIA 63*   CBC: Recent Labs    07/25/23 1726 07/26/23 0437 07/26/23 0443 07/26/23 1609 07/27/23 0500  HGB 8.5* 7.5* 8.2* 8.2* 7.6*  MCV  --  93.7  --   --  96.0    Cardiac Enzymes: No results for input(s): "CKTOTAL", "CKMB", "CKMBINDEX", "TROPONINI" in the last 168 hours. CBG: Recent Labs  Lab 07/26/23 1254  07/26/23 1606 07/26/23 2058 07/26/23 2343 07/27/23 0459  GLUCAP 114* 144* 140* 165* 120*    Iron Studies: No results for input(s): "IRON", "TIBC", "TRANSFERRIN", "FERRITIN" in the last 72 hours. Studies/Results: No results found.   Medications: Infusions:  dexmedetomidine (PRECEDEX) IV infusion 0.2 mcg/kg/hr (07/27/23 0700)   feeding supplement (VITAL 1.5 CAL) 75 mL/hr at 07/27/23 0700   fentaNYL infusion INTRAVENOUS 25 mcg/hr (07/27/23 0700)   heparin 10,000 units/ 20 mL infusion syringe 600 Units/hr (07/26/23 1610)   heparin     norepinephrine (LEVOPHED) Adult infusion Stopped (07/25/23 1313)   PrismaSol BGK 2/3.5 2,000 mL/hr at 07/26/23 0910   prismasol BGK 4/2.5 600 mL/hr at 07/26/23 0650   prismasol BGK 4/2.5 400 mL/hr at 07/25/23 2311   propofol (DIPRIVAN) infusion Stopped (07/26/23 1557)   vasopressin Stopped (07/25/23 0238)    Scheduled Medications:  acetaminophen  650 mg Oral Q6H   amiodarone  200 mg Oral BID   arformoterol  15 mcg Nebulization BID   ascorbic acid  250 mg Oral BID   aspirin  81 mg Oral Daily   budesonide (PULMICORT) nebulizer solution  0.25 mg Nebulization BID   Chlorhexidine Gluconate Cloth  6 each Topical Daily   darbepoetin (ARANESP) injection - DIALYSIS  200 mcg Subcutaneous Q Fri-1800   feeding supplement (PROSource  TF20)  60 mL Per Tube BID   fiber supplement (BANATROL TF)  60 mL Per Tube BID   folic acid  1 mg Oral Daily   insulin aspart  0-20 Units Subcutaneous Q4H   lactulose  30 g Per Tube BID   methimazole  10 mg Oral Daily   metoCLOPramide (REGLAN) injection  10 mg Intravenous Q8H   mexiletine  250 mg Oral Q12H   multivitamin  1 tablet Oral QHS   mupirocin ointment  1 Application Nasal BID   mouth rinse  15 mL Mouth Rinse Q2H   pantoprazole (PROTONIX) IV  40 mg Intravenous QHS   revefenacin  175 mcg Nebulization Daily   sodium chloride flush  10-40 mL Intracatheter Q12H   sodium chloride flush  3 mL Intravenous Q12H   thiamine   100 mg Per Tube Daily   Or   thiamine  100 mg Intravenous Daily   zinc sulfate (50mg  elemental zinc)  220 mg Oral Daily    have reviewed scheduled and prn medications.  Physical Exam: General: Critically ill looking male, more alert SO at bedside  Heart:RRR, s1s2 nl Lungs: Intubated, sedated. Abdomen:soft, non-distended Extremities:No edema Dialysis Access: Left subclavian temporary HD line.-  newest line placed 3/9  Effie Wahlert A Jaydan Meidinger 07/27/2023,7:34 AM  LOS: 18 days

## 2023-07-27 NOTE — Plan of Care (Signed)
  Problem: Clinical Measurements: Goal: Ability to maintain clinical measurements within normal limits will improve Outcome: Progressing Goal: Will remain free from infection Outcome: Progressing Goal: Diagnostic test results will improve Outcome: Progressing   Problem: Activity: Goal: Risk for activity intolerance will decrease Outcome: Progressing    Pt able to follow commands with no sedation. Had two episodes of emesis after which became agitated, minimally redirectable. Pt continued to be tachypnic and tachycardic as RN and SO held his hands while attempting to redirect. E-link videod in the room to which PRN medications were ordered as well as non-violent wrist restraints. After medication administration pt more calm, appears to be resting.

## 2023-07-27 NOTE — Progress Notes (Signed)
Inpatient Rehab Admissions Coordinator:   Per therapy recommendations, patient was screened for CIR candidacy by Clemens Catholic, MS, CCC-SLP. At this time, Pt. is not at a level to tolerate the intensity of CIR.  Pt. may have potential to progress to becoming a potential CIR candidate, so CIR admissions team will follow and monitor for progress and participation with therapies and place consult order if Pt. appears to be an appropriate candidate. Please contact me with any questions.   Clemens Catholic, [place], [place] Admissions Coordinator  [phone] ([place]) [phone] (office)

## 2023-07-27 NOTE — Progress Notes (Signed)
 PHARMACY - ANTICOAGULATION CONSULT NOTE  Pharmacy Consult for Heparin  Indication: pulmonary embolus, s/p ECMO decannulation 3/7, Impella removal 3/12  No Known Allergies  Patient Measurements: Height: 6\' 4"  (193 cm) Weight: 73.8 kg (162 lb 11.2 oz) IBW/kg (Calculated) : 86.8 Heparin Dosing Weight: n/a  Vital Signs: Temp: 99.6 F (37.6 C) (03/16 1153) Temp Source: Oral (03/16 1153) BP: 133/77 (03/16 1300) Pulse Rate: 97 (03/16 1300)  Labs: Recent Labs    07/25/23 0407 07/25/23 0413 07/26/23 0437 07/26/23 0443 07/26/23 1609 07/26/23 1610 07/27/23 0500 07/27/23 1503  HGB 7.4*   < > 7.5* 8.2* 8.2*  --  7.6*  --   HCT 23.3*   < > 23.8* 24.0* 24.0*  --  24.2*  --   PLT 234  --  185  --   --   --  212  --   HEPARINUNFRC  --    < > 0.50  --   --   --  1.02* 0.15*  CREATININE 3.29*   < > 3.39*  3.24*  --   --  4.35* 5.69*  5.80*  --    < > = values in this interval not displayed.    Estimated Creatinine Clearance: 18 mL/min (A) (by C-G formula based on SCr of 5.8 mg/dL (H)).   Medical History: Past Medical History:  Diagnosis Date   Atrial fibrillation [facility])    Hyperthyroidism    Mitral regurgitation    NSTEMI (non-ST elevated myocardial infarction) [facility])     Assessment: 38 yo M with bilateral PE s/p TNK (2/27 @1156 ) now on [place] ECMO + Impella. Pharmacy consulted for bivalirudin for anticoagulation.   S/p impella CP >5.5 3/3 - bivalirudin was previously held with oozing at insertion site but restart 3/4. Patient underwent mitraclip 3/5.  Bival transitioned to heparin 3/7 after decannulation.  Impella 5.5 removed 3/13. Heparin restarted 3/13. Patient also receiving heparin in CRRT circuit; increased from 500 to 600 units/hr last night given has been clotting off CRRT filter.   Heparin level now subtherapeutic after holding and reducing rate. Heparinized syringe also stopped infusing as CRRT is off.  Goal of Therapy:  Heparin level 0.3-0.7 units/mL Monitor platelets  by anticoagulation protocol: Yes   Plan:  Increase heparin to 1700 units/h Repeat heparin level in 8h  Fredonia Highland, PharmD, [place], [facility] Clinical Pharmacist [phone] Please check AMION for all [facility] Pharmacy numbers 07/27/2023

## 2023-07-27 NOTE — Evaluation (Signed)
 Physical Therapy Evaluation Patient Details Name: Aaron [name] MRN: [id number] DOB: 09/27/1985 Today's Date: 07/27/2023  History of Present Illness  Aaron [name] is a 38 y.o. male who presented to the ED on2/26  for evaluation of chest pain and dyspnea. CTA revealed semental bilat lower lob pulmonary emboli.  2/27 had VT/VF arrest, requiring of CPR and multiple shocks. Place on VA ECMO with impella CP. Impella removed 3/12, extubated 3/16.  PMH:  PAF not adherent to Eliquis, chronic HFpEF, severe mitral regurgitation, hyperthyroidism   Clinical Impression  Pt admitted with above. Despite lethargy pt was responsive and followed majority of commands t/o eval. 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 Pt now presenting with severe deconditioning and weakness from prolonged hospital stay. Pt tolerated chair position in the bed well and began moving LEs on own once complete AAROM. Pt with more difficulty moving and coordinating UEs. At this time pt is requiring totalA for all mobility. Recommending inpatient rehab program > 3 hrs a day to address above deficits and achieve maximal functional recovery. Acute PT to cont to follow.        If plan is discharge home, recommend the following: Two people to help with walking and/or transfers;Two people to help with bathing/dressing/bathroom;Assistance with cooking/housework;Assistance with feeding;Assist for transportation;Help with stairs or ramp for entrance   Can travel by private vehicle        Equipment Recommendations  (TBD)  Recommendations for Other Services  Rehab consult;OT consult    Functional Status Assessment Patient has had a recent decline in their functional status and/or demonstrates limited ability to make significant improvements in function in a reasonable and predictable amount of time     Precautions / Restrictions Precautions Precautions: Fall Restrictions Weight Bearing Restrictions Per Provider Order: No      Mobility   Bed Mobility Overal bed mobility: Needs Assistance Bed Mobility: Rolling, Supine to Sit Rolling: Max assist, +2 for physical assistance   Supine to sit: Total assist, +2 for physical assistance     General bed mobility comments: pt placed in chair position in bed, pt with difficulty holding heading up with [facility] all the way up requiring a 10 degress recline. Attempted to have pt pull self forward with bed rails however required maxAx2 to initiate pull forward, modA to maintain x 5 seconds    Transfers                   General transfer comment: unsafe to complete this date    Ambulation/Gait               General Gait Details: unable to complete safely  Stairs            Wheelchair Mobility     Tilt Bed    Modified Rankin (Stroke Patients Only)       Balance Overall balance assessment: Needs assistance Sitting-balance support: Feet unsupported, Bilateral upper extremity supported Sitting balance-Leahy Scale: Zero Sitting balance - Comments: dependent on bed or external posterior support                                     Pertinent Vitals/Pain Pain Assessment Pain Assessment: No/denies pain    Home Living Family/patient expects to be discharged to:: Private residence Living Arrangements: Spouse/significant other Available Help at Discharge: Family;Available 24 hours/day Type of Home: Apartment Home Access: Level entry  Home Layout: One level        Prior Function Prior Level of Function : Independent/Modified Independent             Mobility Comments: no AD, drives, is a Paediatric nurse ADLs Comments: indep     Extremity/Trunk Assessment   Upper Extremity Assessment Upper Extremity Assessment: Generalized weakness (weakest at bilat shoulders, impaired coordination, both grossly and fine motor)    Lower Extremity Assessment Lower Extremity Assessment: Generalized weakness (pt able to complete LAQ in chair position  in bed)    Cervical / Trunk Assessment Cervical / Trunk Assessment: Other exceptions Cervical / Trunk Exceptions: weakness  Communication   Communication Communication: Impaired Factors Affecting Communication: Reduced clarity of speech    Cognition Arousal: Lethargic (sleepy but alert) Behavior During Therapy: Flat affect   PT - Cognitive impairments: Sequencing, Problem solving, Attention, Initiation                       PT - Cognition Comments: pt with difficulty initiating and sequencing bilat UE movements with AAROM, pt shaking head yes/no appropriately Following commands: Impaired Following commands impaired: Follows one step commands with increased time     Cueing Cueing Techniques: Verbal cues, Visual cues, Tactile cues     General Comments General comments (skin integrity, edema, etc.): Pt with some edema t/o body otherwise VSS    Exercises General Exercises - Lower Extremity Ankle Circles/Pumps: AAROM, Both, 10 reps, Seated Long Arc Quad: AROM, Both, 10 reps, Seated (encouraged end range hold) Other Exercises Other Exercises: AAROM to bilat shld, elbows and wrists x 10 reps   Assessment/Plan    PT Assessment Patient needs continued PT services  PT Problem List Decreased strength;Decreased range of motion;Decreased activity tolerance;Decreased balance;Decreased mobility;Decreased coordination;Decreased cognition       PT Treatment Interventions DME instruction;Gait training;Functional mobility training;Therapeutic activities;Therapeutic exercise;Balance training    PT Goals (Current goals can be found in the Care Plan section)  Acute Rehab PT Goals Patient Stated Goal: home PT Goal Formulation: With patient/family Time For Goal Achievement: 08/10/23 Potential to Achieve Goals: Good    Frequency Min 2X/week     Co-evaluation               AM-PAC PT "6 Clicks" Mobility  Outcome Measure Help needed turning from your back to your side  while in a flat bed without using bedrails?: Total Help needed moving from lying on your back to sitting on the side of a flat bed without using bedrails?: Total Help needed moving to and from a bed to a chair (including a wheelchair)?: Total Help needed standing up from a chair using your arms (e.g., wheelchair or bedside chair)?: Total Help needed to walk in hospital room?: Total Help needed climbing 3-5 steps with a railing? : Total 6 Click Score: 6    End of Session Equipment Utilized During Treatment: Oxygen Activity Tolerance: Patient tolerated treatment well Patient left: in bed;with call bell/phone within reach;with bed alarm set;with family/visitor present (in chair position) Nurse Communication: Mobility status (chair position in bed) PT Visit Diagnosis: Unsteadiness on feet (R26.81);Muscle weakness (generalized) (M62.81);Difficulty in walking, not elsewhere classified (R26.2)    Time: 9147-8295 PT Time Calculation (min) (ACUTE ONLY): 23 min   Charges:   PT Evaluation $PT Eval Moderate Complexity: 1 Mod PT Treatments $Therapeutic Exercise: 8-22 mins PT General Charges $$ ACUTE PT VISIT: 1 Visit         Lewis Shock, PT, DPT Acute  Rehabilitation Services Secure chat preferred Office #: [phone]   Iona Hansen 07/27/2023, 3:09 PM

## 2023-07-27 NOTE — Progress Notes (Signed)
 PHARMACY - ANTICOAGULATION CONSULT NOTE  Pharmacy Consult for Heparin  Indication: pulmonary embolus, s/p ECMO decannulation 3/7, Impella removal 3/12  No Known Allergies  Patient Measurements: Height: 6\' 4"  (193 cm) Weight: 73.8 kg (162 lb 11.2 oz) IBW/kg (Calculated) : 86.8 Heparin Dosing Weight: n/a  Vital Signs: Temp: 99 F (37.2 C) (03/16 2312) Temp Source: Oral (03/16 2312) BP: 141/76 (03/16 2300) Pulse Rate: 89 (03/16 2300)  Labs: Recent Labs    07/25/23 0407 07/25/23 0413 07/26/23 0437 07/26/23 0443 07/26/23 1609 07/26/23 1610 07/27/23 0500 07/27/23 1503 07/27/23 1608 07/27/23 2304  HGB 7.4*   < > 7.5*   < > 8.2*  --  7.6*  --  11.9*  --   HCT 23.3*   < > 23.8*   < > 24.0*  --  24.2*  --  35.0*  --   PLT 234  --  185  --   --   --  212  --   --   --   HEPARINUNFRC  --    < > 0.50  --   --   --  1.02* 0.15*  --  0.24*  CREATININE 3.29*   < > 3.39*  3.24*  --   --  4.35* 5.69*  5.80* 7.40*  --   --    < > = values in this interval not displayed.    Estimated Creatinine Clearance: 14.1 mL/min (A) (by C-G formula based on SCr of 7.4 mg/dL (H)).   Medical History: Past Medical History:  Diagnosis Date   Atrial fibrillation [facility])    Hyperthyroidism    Mitral regurgitation    NSTEMI (non-ST elevated myocardial infarction) [facility])     Assessment: 38 yo M with bilateral PE s/p TNK (2/27 @1156 ) now on [place] ECMO + Impella. Pharmacy consulted for bivalirudin for anticoagulation.   S/p impella CP >5.5 3/3 - bivalirudin was previously held with oozing at insertion site but restart 3/4. Patient underwent mitraclip 3/5.  Bival transitioned to heparin 3/7 after decannulation.  Impella 5.5 removed 3/13. Heparin restarted 3/13. Heparin level now subtherapeutic after holding and reducing rate. Heparinized syringe also stopped infusing as CRRT is off.  PM: heparin level subtherapeutic on 1700 units/hr (~7h after rate change). Per RN, no signs/symptoms of bleeding or issues  with the infusion running continuously.   Goal of Therapy:  Heparin level 0.3-0.7 units/mL Monitor platelets by anticoagulation protocol: Yes   Plan:  Increase heparin to 1900 units/h Heparin level in 8h Heparin level and CBC daily   Arabella Merles, PharmD. Clinical Pharmacist 07/27/2023 11:33 PM

## 2023-07-27 NOTE — Progress Notes (Signed)
 eLink Physician-Brief Progress Note Patient Name: Aaron [name] DOB: 03/30/1986 MRN: [id number]   Date of Service  07/27/2023  HPI/Events of Note  Need bil soft wrist restraints On PRN dilaudid, can you please change the pain score to CPOT instead of 0-10  Patient seen intubated and a risk for self harm by pulling lines and tubes  eICU Interventions  Bilateral soft wrist restraints renewed Bedside team to assess in am if restraints to be continued Changed Dilaudid prn for CPOT > 3     Intervention Category Intermediate Interventions: Pain - evaluation and management Minor Interventions: Agitation / anxiety - evaluation and management  Darl Pikes 07/27/2023, 4:02 AM

## 2023-07-27 NOTE — Progress Notes (Signed)
 PHARMACY - ANTICOAGULATION CONSULT NOTE  Pharmacy Consult for Heparin  Indication: pulmonary embolus, s/p ECMO decannulation 3/7, Impella removal 3/12  No Known Allergies  Patient Measurements: Height: 6\' 4"  (193 cm) Weight: 73.8 kg (162 lb 11.2 oz) IBW/kg (Calculated) : 86.8 Heparin Dosing Weight: n/a  Vital Signs: Temp: 98.7 F (37.1 C) (03/16 0000) Temp Source: Axillary (03/16 0000) BP: 130/73 (03/16 0530) Pulse Rate: 94 (03/16 0530)  Labs: Recent Labs    07/25/23 0407 07/25/23 0413 07/25/23 0935 07/25/23 1623 07/26/23 0437 07/26/23 0443 07/26/23 1609 07/26/23 1610 07/27/23 0500  HGB 7.4*   < > 7.2*   < > 7.5* 8.2* 8.2*  --  7.6*  HCT 23.3*   < > 23.0*   < > 23.8* 24.0* 24.0*  --  24.2*  PLT 234  --   --   --  185  --   --   --  212  HEPARINUNFRC  --   --  0.49  --  0.50  --   --   --  1.02*  CREATININE 3.29*  --   --    < > 3.39*  3.24*  --   --  4.35* 5.69*  5.80*   < > = values in this interval not displayed.    Estimated Creatinine Clearance: 18 mL/min (A) (by C-G formula based on SCr of 5.8 mg/dL (H)).   Medical History: Past Medical History:  Diagnosis Date   Atrial fibrillation [facility])    Hyperthyroidism    Mitral regurgitation    NSTEMI (non-ST elevated myocardial infarction) [facility])     Assessment: 38 yo M with bilateral PE s/p TNK (2/27 @1156 ) now on [place] ECMO + Impella. Pharmacy consulted for bivalirudin for anticoagulation.   S/p impella CP >5.5 3/3 - bivalirudin was previously held with oozing at insertion site but restart 3/4. Patient underwent mitraclip 3/5.  Bival transitioned to heparin 3/7 after decannulation.  Impella 5.5 removed 3/13. Heparin restarted 3/13. Patient also receiving heparin in CRRT circuit; increased from 500 to 600 units/hr last night given has been clotting off CRRT filter.   Heparin level is supratherapeutic at 1.0 on UFH 1950 units/hour. No signs of bleeding. CBC, fibrinogen, and LDH stable.  Goal of Therapy:  Heparin  level 0.3-0.7 units/mL Monitor platelets by anticoagulation protocol: Yes   Plan:  Hold heparin for 1 hour and then resume at 1500 units/hr  Continue 600 units/hr in CRRT circuit Obtain 6-hour anti-Xa level Monitor daily anti-Xa level and CBC, monitor s/sx bleeding   Thank you for allowing pharmacy to participate in this patient's care,  Wilmer Floor, PharmD PGY2 Cardiology Pharmacy Resident 07/27/2023 6:32 AM

## 2023-07-27 NOTE — Progress Notes (Signed)
 NAME:  Aaron [name], MRN:  [id number], DOB:  02-Apr-1986, LOS: 18 ADMISSION DATE:  07/09/2023, CONSULTATION DATE:  07/11/2023 REFERRING MD: Clearnce Hasten, CHIEF COMPLAINT: Status post cardiac arrest  History of Present Illness:  38 year old male with hypothyroidism, paroxysmal A-fib, severe mitral regurgitation who presented to [facility] with che st pain and shortness of breath for few days associated cough, nausea and diarrhea.  In the emergency department he was diagnosed with PE, was started on IV heparin.  He takes beta-blocker, steroid and methimazole for hyperthyroidism.  On 2/27 patient rapidly deteriorated developed V. tach/V-fib cardiac arrest patient was intubated after 40 minutes of CPR, cardiology was consulted patient was placed on VA ECMO and was transferred to [facility]  Pertinent  Medical History   Past Medical History:  Diagnosis Date   Atrial fibrillation [facility])    Hyperthyroidism    Mitral regurgitation    NSTEMI (non-ST elevated myocardial infarction) (HCC)     Significant Hospital Events: Including procedures, antibiotic start and stop dates in addition to other pertinent events   3/5 salvage TEER (transcatheter edge to edge mitral valve repair 3/7 weaned off [facility] 3/8 bronch for mucus plugging 3/12 impella removed, extubation trial 3/13 reintubated, bronch for recurrent plugging, HD cath rewired due to malfunction  Interim History / Subjective:  He's awake!  Objective   Blood pressure (!) 175/119, pulse (!) 126, temperature 98.7 F (37.1 C), temperature source Axillary, resp. rate (!) 46, height 6\' 4"  (1.93 m), weight 73.8 kg, SpO2 98%. CVP:  [0 mmHg-20 mmHg] 7 mmHg  Vent Mode: PRVC FiO2 (%):  [30 %-40 %] 40 % Set Rate:  [20 bmp] 20 bmp Vt Set:  [580 mL] 580 mL PEEP:  [8 cmH20] 8 cmH20 Pressure Support:  [5 cmH20] 5 cmH20 Plateau Pressure:  [18 cmH20] 18 cmH20   Intake/Output Summary (Last 24 hours) at 07/27/2023 0818 Last data filed at  07/27/2023 0800 Gross per 24 hour  Intake 2904.62 ml  Output 2089 ml  Net 815.62 ml   Filed Weights   07/23/23 0500 07/26/23 0500 07/27/23 0500  Weight: 87.4 kg 73.8 kg 73.8 kg    Examination: No distress Follows commands x 4 Lungs minimal rhonci, mechanics fine Heart tachy, ext warm Ext minimal edema  H/H stable WBC went up off abx again  Resolved Hospital Problem list   Lactic acidosis, resolved Refractory hyperkalemia, improving  Assessment & Plan:  Baseline severe mitral insufficiency with superimposed RV>LV shock state culminating in prolonged IHCA s/p VA ECMO 2/27- salvage TEER 3/5, weaned from [place] to impella 3/7.  Post arrest encephalopathy- did not follow commands post extubation; some reports that he did during ecmo run; MRI reassuring 3/13 Acute renal failure- on CRRT Thyrotoxicosis- on methimazole, stress steroids; stress steroids off on 3/8 Recurrent mucus plugging, resp failure- at baseline coughs up brown material (???) per fiance.  BAL 3/8 neg.  Failed extubation trial 3/12 due to recurrent plugging, BAL sent again. Leukocytosis- suspect mostly reactive, antimicrobials don't make a difference; abx off 3/15 Strokes- small likely embolic  Heparin goal Xa 3-7 DC abx and monitor fever/WBC curve: back up again; if drifts up again tomorrow we need to do a line holiday Continue triple neb therapy Amio now PO; Qtc okay iHD tomorrow then consider line holiday Precedex and extubate; CPT + hypertonic PT/OT eval Thyroid labs reviewed: reduce methimazole on 3/14 and recheck in 1 week (3/21) Fiance updated  Best Practice (right click and "Reselect all SmartList Selections"  daily)   Diet/type: NPO TF DVT prophylaxis heparin Pressure ulcer(s): sacrum, turn PRN GI prophylaxis: PPI Lines: Central line, Arterial Line, and yes and it is still needed.   Foley:  Yes, and it is still needed Code Status:  full code Last date of multidisciplinary goals of care discussion  [updated]  34 min cc time Myrla Halsted MD PCCM

## 2023-07-27 NOTE — Procedures (Signed)
 Extubation Procedure Note  Patient Details:   Name: Aaron [name] DOB: 02/26/1986 MRN: [id number]   Airway Documentation:    Vent end date: 07/27/23 Vent end time: 0820   Evaluation  O2 sats: stable throughout Complications: No apparent complications Patient did tolerate procedure well. Bilateral Breath Sounds: Rhonchi, Diminished   Yes, pt able to cough to clear secretions. Pt positive for cuff leak prior to extubation. Pt placed on 4L humidified nasal cannula and tolerating well at this time  Tacy Learn 07/27/2023, 8:22 AM

## 2023-07-27 NOTE — Progress Notes (Signed)
 Pt NTS at this time with moderate return of clear/white secretions. Pt tolerated procedure well, all vital signs WNR

## 2023-07-28 DIAGNOSIS — R0602 Shortness of breath: Secondary | ICD-10-CM | POA: Diagnosis not present

## 2023-07-28 DIAGNOSIS — I340 Nonrheumatic mitral (valve) insufficiency: Secondary | ICD-10-CM | POA: Diagnosis not present

## 2023-07-28 DIAGNOSIS — R570 Cardiogenic shock: Secondary | ICD-10-CM | POA: Diagnosis not present

## 2023-07-28 DIAGNOSIS — I4891 Unspecified atrial fibrillation: Secondary | ICD-10-CM | POA: Diagnosis not present

## 2023-07-28 DIAGNOSIS — I472 Ventricular tachycardia, unspecified: Secondary | ICD-10-CM

## 2023-07-28 DIAGNOSIS — I2699 Other pulmonary embolism without acute cor pulmonale: Secondary | ICD-10-CM | POA: Diagnosis not present

## 2023-07-28 DIAGNOSIS — J9601 Acute respiratory failure with hypoxia: Secondary | ICD-10-CM | POA: Diagnosis not present

## 2023-07-28 DIAGNOSIS — R079 Chest pain, unspecified: Secondary | ICD-10-CM | POA: Diagnosis not present

## 2023-07-28 DIAGNOSIS — N170 Acute kidney failure with tubular necrosis: Secondary | ICD-10-CM | POA: Diagnosis not present

## 2023-07-28 DIAGNOSIS — I469 Cardiac arrest, cause unspecified: Secondary | ICD-10-CM | POA: Diagnosis not present

## 2023-07-28 DIAGNOSIS — I272 Pulmonary hypertension, unspecified: Secondary | ICD-10-CM | POA: Diagnosis not present

## 2023-07-28 DIAGNOSIS — E875 Hyperkalemia: Secondary | ICD-10-CM | POA: Diagnosis not present

## 2023-07-28 DIAGNOSIS — E872 Acidosis, unspecified: Secondary | ICD-10-CM | POA: Diagnosis not present

## 2023-07-28 LAB — HEPATIC FUNCTION PANEL
ALT: 26 U/L (ref 0–44)
AST: 42 U/L — ABNORMAL HIGH (ref 15–41)
Albumin: 2.1 g/dL — ABNORMAL LOW (ref 3.5–5.0)
Alkaline Phosphatase: 115 U/L (ref 38–126)
Bilirubin, Direct: 0.2 mg/dL (ref 0.0–0.2)
Indirect Bilirubin: 0.9 mg/dL (ref 0.3–0.9)
Total Bilirubin: 1.1 mg/dL (ref 0.0–1.2)
Total Protein: 6.5 g/dL (ref 6.5–8.1)

## 2023-07-28 LAB — COOXEMETRY PANEL
Carboxyhemoglobin: 2.5 % — ABNORMAL HIGH (ref 0.5–1.5)
Methemoglobin: <0.7 % (ref 0.0–1.5)
O2 Saturation: 79.1 %
Total hemoglobin: 7.8 g/dL — ABNORMAL LOW (ref 12.0–16.0)

## 2023-07-28 LAB — GLUCOSE, CAPILLARY
Glucose-Capillary: 143 mg/dL — ABNORMAL HIGH (ref 70–99)
Glucose-Capillary: 147 mg/dL — ABNORMAL HIGH (ref 70–99)
Glucose-Capillary: 113 mg/dL — ABNORMAL HIGH (ref 70–99)
Glucose-Capillary: 174 mg/dL — ABNORMAL HIGH (ref 70–99)
Glucose-Capillary: 156 mg/dL — ABNORMAL HIGH (ref 70–99)
Glucose-Capillary: 154 mg/dL — ABNORMAL HIGH (ref 70–99)

## 2023-07-28 LAB — FIBRINOGEN: Fibrinogen: 651 mg/dL — ABNORMAL HIGH (ref 210–475)

## 2023-07-28 LAB — HEPARIN LEVEL (UNFRACTIONATED)
Heparin Unfractionated: 0.35 [IU]/mL (ref 0.30–0.70)
Heparin Unfractionated: 0.35 [IU]/mL (ref 0.30–0.70)

## 2023-07-28 LAB — CBC
HCT: 24.2 % — ABNORMAL LOW (ref 39.0–52.0)
Hemoglobin: 7.6 g/dL — ABNORMAL LOW (ref 13.0–17.0)
MCH: 29.8 pg (ref 26.0–34.0)
MCHC: 31.4 g/dL (ref 30.0–36.0)
MCV: 94.9 fL (ref 80.0–100.0)
Platelets: 307 10*3/uL (ref 150–400)
RBC: 2.55 MIL/uL — ABNORMAL LOW (ref 4.22–5.81)
RDW: 20.6 % — ABNORMAL HIGH (ref 11.5–15.5)
WBC: 18.4 10*3/uL — ABNORMAL HIGH (ref 4.0–10.5)
nRBC: 0.3 % — ABNORMAL HIGH (ref 0.0–0.2)

## 2023-07-28 LAB — RENAL FUNCTION PANEL
Albumin: 2.1 g/dL — ABNORMAL LOW (ref 3.5–5.0)
Anion gap: 12 (ref 5–15)
BUN: 116 mg/dL — ABNORMAL HIGH (ref 6–20)
CO2: 18 mmol/L — ABNORMAL LOW (ref 22–32)
Calcium: 7.9 mg/dL — ABNORMAL LOW (ref 8.9–10.3)
Chloride: 103 mmol/L (ref 98–111)
Creatinine, Ser: 8.93 mg/dL — ABNORMAL HIGH (ref 0.61–1.24)
GFR, Estimated: 7 mL/min — ABNORMAL LOW (ref >60)
Glucose, Bld: 170 mg/dL — ABNORMAL HIGH (ref 70–99)
Phosphorus: 4.3 mg/dL (ref 2.5–4.6)
Potassium: 4.6 mmol/L (ref 3.5–5.1)
Sodium: 133 mmol/L — ABNORMAL LOW (ref 135–145)

## 2023-07-28 LAB — MAGNESIUM: Magnesium: 2.6 mg/dL — ABNORMAL HIGH (ref 1.7–2.4)

## 2023-07-28 LAB — LACTATE DEHYDROGENASE: LDH: 427 U/L — ABNORMAL HIGH (ref 98–192)

## 2023-07-28 MED ORDER — LOPERAMIDE HCL 2 MG PO CAPS: 4.0000 mg | ORAL_CAPSULE | Freq: Once | ORAL | Status: DC

## 2023-07-28 MED ORDER — HYDROMORPHONE HCL 1 MG/ML IJ SOLN: 1.0000 mg | INTRAMUSCULAR | Status: DC | PRN

## 2023-07-28 MED ORDER — LIDOCAINE-PRILOCAINE 2.5-2.5 % EX CREA: 1.0000 | TOPICAL_CREAM | CUTANEOUS | Status: DC | PRN

## 2023-07-28 MED ORDER — ALTEPLASE 2 MG IJ SOLR
2.0000 mg | Freq: Once | INTRAMUSCULAR | Status: DC | PRN
  Filled 2023-07-28: qty 2

## 2023-07-28 MED ORDER — HEPARIN SODIUM (PORCINE) 1000 UNIT/ML DIALYSIS
1000.0000 [IU] | INTRAMUSCULAR | Status: DC | PRN
  Administered 2023-07-28: 2800 [IU]
  Administered 2023-07-30: 3000 [IU]
  Filled 2023-07-28 (×2): qty 1

## 2023-07-28 MED ORDER — LACTULOSE 10 GM/15ML PO SOLN: 30.0000 g | Freq: Two times a day (BID) | ORAL | Status: DC | PRN

## 2023-07-28 MED ORDER — ANTICOAGULANT SODIUM CITRATE 4% (200MG/5ML) IV SOLN: 5.0000 mL | Status: DC | PRN

## 2023-07-28 MED ORDER — PENTAFLUOROPROP-TETRAFLUOROETH EX AERO: 1.0000 | INHALATION_SPRAY | CUTANEOUS | Status: DC | PRN

## 2023-07-28 MED ORDER — LOPERAMIDE HCL 1 MG/7.5ML PO SUSP
4.0000 mg | Freq: Once | ORAL | Status: AC
  Administered 2023-07-28: 4 mg
  Filled 2023-07-28: qty 30

## 2023-07-28 MED ORDER — LIDOCAINE HCL (PF) 1 % IJ SOLN: 5.0000 mL | INTRAMUSCULAR | Status: DC | PRN

## 2023-07-28 MED ORDER — LOPERAMIDE HCL 2 MG PO CAPS: 2.0000 mg | ORAL_CAPSULE | Freq: Two times a day (BID) | ORAL | Status: DC | PRN

## 2023-07-28 MED ORDER — LOPERAMIDE HCL 1 MG/7.5ML PO SUSP
2.0000 mg | Freq: Two times a day (BID) | ORAL | Status: DC | PRN
  Administered 2023-07-30: 2 mg
  Filled 2023-07-28 (×2): qty 15

## 2023-07-28 MED ORDER — HEPARIN SODIUM (PORCINE) 1000 UNIT/ML DIALYSIS
20.0000 [IU]/kg | INTRAMUSCULAR | Status: DC | PRN
  Filled 2023-07-28: qty 2

## 2023-07-28 NOTE — Progress Notes (Signed)
 SLP Cancellation Note  Patient Details Name: FRANKE MENTER MRN: [id number] DOB: [birth year]   Cancelled treatment:       Reason Eval/Treat Not Completed: Patient at procedure or test/unavailable (HD). Discussed with RN and will return to complete a swallowing evaluation as time permits.    Gwynneth Aliment, M.A., CF-SLP Speech Language Pathology, Acute Rehabilitation Services  Secure Chat preferred [phone]  07/28/2023, 12:21 PM

## 2023-07-28 NOTE — Progress Notes (Signed)
 [facility] NEPHROLOGY PROGRESS NOTE  Assessment/ Plan: Pt is a 38 y.o. yo male  likely ATN after cardiac arrest, cardiogenic shock on ECMO ( decan 3/8) and Impella ( removed 3/12), AHRF/VDRF   # Dialysis dependent anuric AKI 2/2 ATN from cardiogenic shock on CRRT since 07/11/23, Left subclavian temp HD cath placed by CCM;  Tolerating CRRT well however getting interruptions because of procedures, imaging studies as well as clotting.  The potassium level was running high --  CRRT prescription was changed, then K in the 3's-  change again to 4 K pre and post and cont 2 K dialysate-  UF around 50 / hour-  volume does not seem terrible.   Clotting was an issue.  CRRT was stopped on 3/15.  No UOP and numbers rising.  Will plan for IHD today with no UF because of high stool output.  Systolic is 130s and should be able to tolerate intermittent HD.   Will also check a bladder ultrasound but doubt he is retaining.    # Cardiogenic Shock,  Impella removed; status post transcutaneous mitral valve repair; previously on ECMO. per cardiology.  # VT + PEA SCA, as per cardiology team. # PE on heparin per CCM # Anemia, Hb  dropping--  added ESA, supportive care # Hyperkalemia, less present .  Existing CRRT. # Severe MR s/p TEER 3/5   Subjective:  Extubated, awake and appropriate; fianc is bedside.  No urine output.recorded.  CRRT stopped 3/15.   Objective Vital signs in last 24 hours: Vitals:   07/28/23 0817 07/28/23 0819 07/28/23 0820 07/28/23 0823  BP:    131/76  Pulse:      Resp:      Temp:    100.2 F (37.9 C)  TempSrc:    Axillary  SpO2: 99% 100% 100%   Weight:      Height:       Weight change: 0.2 kg  Intake/Output Summary (Last 24 hours) at 07/28/2023 0907 Last data filed at 07/28/2023 0800 Gross per 24 hour  Intake 2735.12 ml  Output 1780 ml  Net 955.12 ml       Labs: RENAL PANEL Recent Labs  Lab 07/24/23 0412 07/24/23 0442 07/25/23 0208 07/25/23 0407  07/26/23 0437 07/26/23 0443 07/26/23 1610 07/27/23 0500 07/27/23 1503 07/27/23 1608 07/28/23 0512  NA 130*   < > 130*   < > 134*  133*   < > 134* 137  133* 134* 135 133*  K 5.5*   < > 3.8   < > 3.9  3.8   < > 4.1 4.4  4.3 4.4 4.5 4.6  CL 98   < > 97*   < > 100  101  --  101 102  102 102  --  103  CO2 21*   < > 20*   < > 21*  22  --  22 17*  20* 16*  --  18*  GLUCOSE 133*   < > 112*   < > 150*  153*  --  154* 132*  136* 148*  --  170*  BUN 56*   < > 54*   < > 47*  46*  --  55* 75*  75* 90*  --  116*  CREATININE 3.60*   < > 3.39*   < > 3.39*  3.24*  --  4.35* 5.69*  5.80* 7.40*  --  8.93*  CALCIUM 8.3*   < > 8.0*   < > 8.2*  8.0*  --  8.1* 8.0*  7.8* 7.9*  --  7.9*  MG 2.5*  --  2.3  --  2.2  --   --  2.3  --   --  2.6*  PHOS 4.5   < > 4.1   < > 3.8  3.8  --  4.4 4.5  4.5 4.1  --  4.3  ALBUMIN 2.2*  2.2*   < > 2.0*   < > 2.2*  2.1*  --  2.2* 2.2*  2.1* 2.1*  --  2.1*  2.1*   < > = values in this interval not displayed.    Liver Function Tests: Recent Labs  Lab 07/26/23 0437 07/26/23 1610 07/27/23 0500 07/27/23 1503 07/28/23 0512  AST 49*  --  46*  --  42*  ALT 25  --  27  --  26  ALKPHOS 128*  --  118  --  115  BILITOT 1.0  --  1.0  --  1.1  PROT 7.3  --  6.8  --  6.5  ALBUMIN 2.2*  2.1*   < > 2.2*  2.1* 2.1* 2.1*  2.1*   < > = values in this interval not displayed.   No results for input(s): "LIPASE", "AMYLASE" in the last 168 hours. Recent Labs  Lab 07/24/23 1155  AMMONIA 63*   CBC: Recent Labs    07/26/23 0443 07/26/23 1609 07/27/23 0500 07/27/23 1608 07/28/23 0512  HGB 8.2* 8.2* 7.6* 11.9* 7.6*  MCV  --   --  96.0  --  94.9    Cardiac Enzymes: No results for input(s): "CKTOTAL", "CKMB", "CKMBINDEX", "TROPONINI" in the last 168 hours. CBG: Recent Labs  Lab 07/27/23 1600 07/27/23 2001 07/27/23 2303 07/28/23 0306 07/28/23 0819  GLUCAP 125* 130* 183* 143* 147*    Iron Studies: No results for input(s): "IRON", "TIBC",  "TRANSFERRIN", "FERRITIN" in the last 72 hours. Studies/Results: No results found.   Medications: Infusions:  anticoagulant sodium citrate     dexmedetomidine (PRECEDEX) IV infusion 0.4 mcg/kg/hr (07/28/23 0800)   feeding supplement (VITAL 1.5 CAL) 75 mL/hr at 07/28/23 0800   heparin 1,900 Units/hr (07/28/23 0800)    Scheduled Medications:  acetaminophen  650 mg Oral Q6H   amiodarone  200 mg Oral BID   arformoterol  15 mcg Nebulization BID   ascorbic acid  250 mg Oral BID   aspirin  81 mg Oral Daily   budesonide (PULMICORT) nebulizer solution  0.25 mg Nebulization BID   Chlorhexidine Gluconate Cloth  6 each Topical Q0600   darbepoetin (ARANESP) injection - DIALYSIS  200 mcg Subcutaneous Q Fri-1800   feeding supplement (PROSource TF20)  60 mL Per Tube BID   fiber supplement (BANATROL TF)  60 mL Per Tube BID   folic acid  1 mg Oral Daily   insulin aspart  0-20 Units Subcutaneous Q4H   loperamide HCl  4 mg Per Tube Once   methimazole  10 mg Oral Daily   metoCLOPramide (REGLAN) injection  10 mg Intravenous Q8H   mexiletine  250 mg Oral Q12H   multivitamin  1 tablet Oral QHS   mouth rinse  15 mL Mouth Rinse 4 times per day   revefenacin  175 mcg Nebulization Daily   sodium chloride flush  10-40 mL Intracatheter Q12H   sodium chloride flush  3 mL Intravenous Q12H   sodium chloride HYPERTONIC  4 mL Nebulization BID   thiamine  100 mg Per Tube Daily   Or  thiamine  100 mg Intravenous Daily   zinc sulfate (50mg  elemental zinc)  220 mg Oral Daily    have reviewed scheduled and prn medications.  Physical Exam: General: Critically ill looking male, more alert SO at bedside  Heart:RRR, s1s2 nl Lungs: Intubated, sedated. Abdomen:soft, non-distended Extremities:No edema Dialysis Access: Left subclavian temporary HD line.-  newest line placed 3/9  Derek Laughter W 07/28/2023,9:07 AM  LOS: 19 days

## 2023-07-28 NOTE — Progress Notes (Signed)
 NAME:  Aaron [name], MRN:  [id number], DOB:  [birth year], LOS: 19 ADMISSION DATE:  07/09/2023, CONSULTATION DATE:  07/11/2023 REFERRING MD: Clearnce Hasten, CHIEF COMPLAINT: Status post cardiac arrest  History of Present Illness:  38 year old male with hypothyroidism, paroxysmal A-fib, severe mitral regurgitation who presented to [facility] long hospital with che st pain and shortness of breath for few days associated cough, nausea and diarrhea.  In the emergency department he was diagnosed with PE, was started on IV heparin.  He takes beta-blocker, steroid and methimazole for hyperthyroidism.  On 2/27 patient rapidly deteriorated developed V. tach/V-fib cardiac arrest patient was intubated after 40 minutes of CPR, cardiology was consulted patient was placed on VA ECMO and was transferred to [facility].  Pertinent  Medical History   Past Medical History:  Diagnosis Date   Atrial fibrillation [facility])    Hyperthyroidism    Mitral regurgitation    NSTEMI (non-ST elevated myocardial infarction) (HCC)     Significant Hospital Events: Including procedures, antibiotic start and stop dates in addition to other pertinent events   2/26 admitted +PE 2/27 vtac/ vfib arrest> rosc after 40 mins, s/p TNK,  VA ecmo 3/5 salvage TEER (transcatheter edge to edge mitral valve repair) 3/7 weaned off VA 3/8 bronch for mucus plugging, VA ecmo decannulation >impella 5.5 3/12 impella removed, extubation trial 2/28 CRRT 3/13 reintubated, bronch for recurrent plugging, HD cath rewired due to malfunction 3/15 CRRT stopped, remains anuric 3/16 awake f/c, extubated  Interim History / Subjective:  Plans for iHD today CVP 1-2 Stool output 2.2L/ 24hrs Remains on dex   Objective   Blood pressure 131/76, pulse 92, temperature 98.9 F (37.2 C), temperature source Oral, resp. rate (!) 25, height 6\' 4"  (1.93 m), weight 74 kg, SpO2 99%. CVP:  [0 mmHg-11 mmHg] 5 mmHg  FiO2 (%):  [40 %] 40 %   Intake/Output  Summary (Last 24 hours) at 07/28/2023 0740 Last data filed at 07/28/2023 0525 Gross per 24 hour  Intake 2638.96 ml  Output 2280 ml  Net 358.96 ml   Filed Weights   07/26/23 0500 07/27/23 0500 07/28/23 0328  Weight: 73.8 kg 73.8 kg 74 kg    Examination: Dex 0.4 General:  Adult male lying in bed in NAD HEENT: MM pink/moist, pupils 4/r, anicteric, phonation very soft, minimal, cortrak L nare Neuro: Awake, oriented to person, place, not time, MAE- slowly/ severe generalized weakness CV: rr, NSR, sutures/ site wnl R upper chest, R femoral site healing, wnl PULM:  tachypneic 20s, clear, diminished in R base, 3L [place] 100% GI: soft, bs hyper, NT, no foley Extremities: warm/dry, no tibial edema, BLE unna boots Skin: no rashes  - L internal jugular CVL, L [place] trialysis, R radial aline, R TL PICC  Afebrile overnight Anuric +383ml/ 24hrs Net -7.2L CVP 1-2 Coox 79 Labs reviewed> BUN/ sCr rising, K 4.6, Na 133, Mag 2.6, WBC 20.9< 18.4, H/H stable, fibrinogen 651, coo  Resolved Hospital Problem list   Lactic acidosis, resolved Refractory hyperkalemia, improving  Assessment & Plan:  Baseline severe mitral insufficiency with superimposed RV>LV shock state culminating in prolonged IHCA s/p VA ECMO 2/27- salvage TEER 3/5, weaned from [place] to impella 3/7.  Coox stable off pressors Pulmonary hypertension VT/ NSVT Afib Segmental PE Post arrest encephalopathy- did not follow commands post extubation; some reports that he did during ecmo run; MRI reassuring 3/13.  Improving 3/16 Acute renal failure- CRRT stopped 3/17 Thyrotoxicosis- on methimazole, stress steroids; stress steroids off on 3/8 Recurrent  mucus plugging, resp failure- at baseline coughs up brown material (???) per fiance.  BAL 3/8 neg.  Failed extubation trial 3/12 due to recurrent plugging, BAL sent again.  Extubated 3/16 Leukocytosis- suspect mostly reactive, antimicrobials don't make a difference; abx off 3/15.   Strokes- small likely  embolic Anemia- stable Deconditioning  - plans for first iHD today.  Given stool output/ CVP > nephrology to adjust UF plans.  Remains anuric.  - remains afebrile, WBC down trending, monitor clinically - d/c L CVL, R PICC and aline (not correlating w/ cuff, have been using cuff pressures) - continue to minimize precedex, delirium precautions - cont to wean O2, aggressive pulm hygiene> IS, mobilize w/PT/ OT, CPT, HTS nebs - triple therapy nebs - pending SLP eval, d/c cortrak when PO intake stable - cont enteral amio, mexiletine> remains NSR.  AHF team following. - Thyroid labs reviewed: reduce methimazole on 3/14 and recheck in 1 week (3/21) - ESA added per nephrology - H/H remains stable - heparin per pharmacy for now - likely will be stable for transfer out of ICU soon once off precedex and pending iHD tolerance  Best Practice (right click and "Reselect all SmartList Selections" daily)   Diet/type: NPO TF DVT prophylaxis heparin Pressure ulcer(s): sacrum, turn PRN GI prophylaxis: PPI Lines: Central line, Dialysis Catheter, Arterial Line, and No longer needed.  Order written to d/c  except HD cath.   Foley:  N/A Code Status:  full code Last date of multidisciplinary goals of care discussion [updated]   CCT 35 mins  Posey Boyer, MSN, AG-ACNP-BC [facility] 07/28/2023, 12:23 PM  See Amion for pager If no response to pager , please call [phone] until 7pm After 7:00 pm call Elink  [phone]

## 2023-07-28 NOTE — Evaluation (Addendum)
 Clinical/Bedside Swallow Evaluation Patient Details  Name: Aaron [name] MRN: [id number] Date of Birth: Oct 17, 1985  Today's Date: 07/28/2023 Time: SLP Start Time (ACUTE ONLY): 1618 SLP Stop Time (ACUTE ONLY): 1633 SLP Time Calculation (min) (ACUTE ONLY): 15 min  Past Medical History:  Past Medical History:  Diagnosis Date   Atrial fibrillation (HCC)    Hyperthyroidism    Mitral regurgitation    NSTEMI (non-ST elevated myocardial infarction) [facility])    Past Surgical History:  Past Surgical History:  Procedure Laterality Date   ECMO CANNULATION N/A 07/10/2023   Procedure: ECMO CANNULATION;  Surgeon: Dolores Patty, MD;  Location: [facility] INVASIVE CV LAB;  Service: Cardiovascular;  Laterality: N/A;   EMBOLECTOMY Left 07/18/2023   Procedure: EMBOLECTOMY Left Femoral, Popliteal and iliac arterys,  Repair Left common femoral artery.;  Surgeon: Victorino Sparrow, MD;  Location: [facility] OR;  Service: Vascular;  Laterality: Left;   INTRAOPERATIVE TRANSESOPHAGEAL ECHOCARDIOGRAM N/A 07/23/2023   Procedure: ECHOCARDIOGRAM, TRANSESOPHAGEAL, INTRAOPERATIVE;  Surgeon: Loreli Slot, MD;  Location: [facility] OR;  Service: Open Heart Surgery;  Laterality: N/A;   LEFT HEART CATH AND CORONARY ANGIOGRAPHY N/A 10/22/2021   Procedure: LEFT HEART CATH AND CORONARY ANGIOGRAPHY;  Surgeon: Yvonne Kendall, MD;  Location: [facility] INVASIVE CV LAB;  Service: Cardiovascular;  Laterality: N/A;   PLACEMENT OF IMPELLA LEFT VENTRICULAR ASSIST DEVICE Right 07/14/2023   Procedure: PLACEMENT OF RIGHT AXILLARY IMPELLA 5.5 LEFT VENTRICULAR ASSIST DEVICE;  Surgeon: Loreli Slot, MD;  Location: [facility] OR;  Service: Open Heart Surgery;  Laterality: Right;   REMOVAL OF IMPELLA LEFT VENTRICULAR ASSIST DEVICE Right 07/14/2023   Procedure: REMOVAL OF CP RIGHT FEMORAL IMPELLA LEFT VENTRICULAR ASSIST DEVICE;  Surgeon: Loreli Slot, MD;  Location: [facility] OR;  Service: Open Heart Surgery;  Laterality: Right;   REMOVAL OF IMPELLA LEFT VENTRICULAR  ASSIST DEVICE N/A 07/23/2023   Procedure: REMOVAL, CARDIAC ASSIST DEVICE, IMPELLA;  Surgeon: Loreli Slot, MD;  Location: [facility] OR;  Service: Open Heart Surgery;  Laterality: N/A;   TRANSCATHETER MITRAL EDGE TO EDGE REPAIR N/A 07/16/2023   Procedure: TRANSCATHETER MITRAL EDGE TO EDGE REPAIR;  Surgeon: Tonny Bollman, MD;  Location: [facility] INVASIVE CV LAB;  Service: Cardiovascular;  Laterality: N/A;   TRANSESOPHAGEAL ECHOCARDIOGRAM (CATH LAB) N/A 07/16/2023   Procedure: TRANSESOPHAGEAL ECHOCARDIOGRAM;  Surgeon: Tonny Bollman, MD;  Location: [facility] INVASIVE CV LAB;  Service: Cardiovascular;  Laterality: N/A;   VENTRICULAR ASSIST DEVICE INSERTION N/A 07/10/2023   Procedure: VENTRICULAR ASSIST DEVICE INSERTION;  Surgeon: Dolores Patty, MD;  Location: [facility] INVASIVE CV LAB;  Service: Cardiovascular;  Laterality: N/A;   HPI:  Aaron [name] is a 38 yo male presenting to ED 2/26 with chest pain and dyspnea. Admitted with bilateral lower lobe pulmonary emboli. VT/VF cardiac arrest 2/27, requiring 40 minutes of CPR and multiple shocks. Impella placed 2/27-3/12. ECMO initiated 2/27-3/8. CRRT stopped 3/15. ETT 2/27-3/12, reintubated 3/12-3/16 s/p mucus plugging and L lung opacification. 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 PMH includes hypothyroidism, paroxysmal A-fib, severe mitral regurgitation    Assessment / Plan / Recommendation  Clinical Impression  Pt presents with aphonia suspected to be secondary to prolonged intubation (18 days in total across two intubations). His cough is weak, although question effort as pt's fiance reports his cough is typically significantly stronger than this SLP observed. Although pt did not exhibit overt s/s of aspiration with thin liquids or purees, recommend proceeding with  an MBS prior to initiating a diet given pt's vocal quality and overall deconditioning. Provided education  regarding purpose of instrumental swallow study and aspiration precautions to pt and his fiance. Pending MBS, recommend he remain NPO except for 2-3 ice chips following thorough oral care. Will continue following.  SLP Visit Diagnosis: Dysphagia, unspecified (R13.10)    Aspiration Risk  Moderate aspiration risk    Diet Recommendation NPO;Ice chips PRN after oral care    Medication Administration: Via alternative means    Other  Recommendations Oral Care Recommendations: Oral care QID;Oral care prior to ice chip/H20    Recommendations for follow up therapy are one component of a multi-disciplinary discharge planning process, led by the attending physician.  Recommendations may be updated based on patient status, additional functional criteria and insurance authorization.  Follow up Recommendations Acute inpatient rehab (3hours/day)      Assistance Recommended at Discharge    Functional Status Assessment Patient has had a recent decline in their functional status and demonstrates the ability to make significant improvements in function in a reasonable and predictable amount of time.  Frequency and Duration min 2x/week  2 weeks       Prognosis Prognosis for improved oropharyngeal function: Good Barriers to Reach Goals: Cognitive deficits;Time post onset      Swallow Study   General HPI: Aaron [name] is a 38 yo male presenting to ED 2/26 with chest pain and dyspnea. Admitted with bilateral lower lobe pulmonary emboli. VT/VF cardiac arrest 2/27, requiring 40 minutes of CPR and multiple shocks. Impella placed 2/27-3/12. ECMO initiated 2/27-3/8. CRRT stopped 3/15. ETT 2/27-3/12, reintubated 3/12-3/16 s/p mucus plugging and L lung opacification. 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 PMH includes hypothyroidism, paroxysmal A-fib, severe mitral regurgitation Type of Study: Bedside Swallow  Evaluation Previous Swallow Assessment: none in chart Diet Prior to this Study: NPO;Cortrak/Small bore NG tube Temperature Spikes Noted: No Respiratory Status: Nasal cannula History of Recent Intubation: Yes Total duration of intubation (days): 18 days (across two intubations) Date extubated: 07/27/23 Behavior/Cognition: Alert;Cooperative;Requires cueing Oral Cavity Assessment: Within Functional Limits Oral Care Completed by SLP: No Oral Cavity - Dentition: Adequate natural dentition Vision: Functional for self-feeding Self-Feeding Abilities: Needs assist Patient Positioning: Upright in bed Baseline Vocal Quality: Aphonic Volitional Cough: Weak Volitional Swallow: Able to elicit    Oral/Motor/Sensory Function Overall Oral Motor/Sensory Function: Within functional limits   Ice Chips Ice chips: Not tested   Thin Liquid Thin Liquid: Within functional limits Presentation: Straw    Nectar Thick Nectar Thick Liquid: Not tested   Honey Thick Honey Thick Liquid: Not tested   Puree Puree: Impaired Presentation: Spoon Oral Phase Functional Implications: Oral holding Pharyngeal Phase Impairments: Multiple swallows   Solid     Solid: Not tested      Gwynneth Aliment, M.A., CF-SLP Speech Language Pathology, Acute Rehabilitation Services  Secure Chat preferred [phone]  07/28/2023,5:08 PM

## 2023-07-28 NOTE — Progress Notes (Addendum)
 Patient ID: Aaron [name], male   DOB: Apr 03, 1986, 38 y.o.   MRN: [id number]     Advanced Heart Failure Rounding Note  Cardiologist: Christell Constant, MD  Chief Complaint: V-A ECMO  Subjective:   - Admitted 2/26 with worsening SOB, CP and a fib with RVR. - Decompensated 2/27 with IVF, nodal blockade with subsequent respiratory arrest, ROSC achieved after ~16 minute down time - Femoral VA ecmo cannulation 2/27 with Impella CP vent - ECMO decannulation 3/8 - Impella removed and extubated 3/12.  Had to be reintubated with mucus plugging and left lung opacification.  - MRI head 3/13 with small infarcts right cerebral and cerebellar hemispheres - Re-extubated 3/16  Patient is off milrinone and pressors, co-ox 79%. CRRT stopped 3/15. Plan for iHD today.   He remains on mexiletine and amiodarone.  Remains in NSR this morning.    Resting in bed. Fiancee at bedside.  Objective:   Echo (3/10): EF 35-40%, anterior and septal HK, normal RV, s/p Mitraclip with mild MR and mean gradient 7 mmHg.   Weight Range: 74 kg Body mass index is 19.86 kg/m.   Vital Signs:   Temp:  [98.2 F (36.8 C)-100.2 F (37.9 C)] 100.2 F (37.9 C) (03/17 0823) Pulse Rate:  [84-103] 86 (03/17 0800) Resp:  [19-40] 38 (03/17 0800) BP: (117-149)/(69-90) 122/77 (03/17 0930) SpO2:  [91 %-100 %] 100 % (03/17 0820) Weight:  [74 kg] 74 kg (03/17 0328) Last BM Date : 07/28/23  Weight change: Filed Weights   07/26/23 0500 07/27/23 0500 07/28/23 0328  Weight: 73.8 kg 73.8 kg 74 kg    Intake/Output:   Intake/Output Summary (Last 24 hours) at 07/28/2023 1039 Last data filed at 07/28/2023 0800 Gross per 24 hour  Intake 2638.2 ml  Output 1780 ml  Net 858.2 ml      Physical Exam  General:  weak appearing.  No respiratory difficulty HEENT: +cortrak Neck: supple. JVD flat. [facility] CVC. [facility] HD cath Cor: PMI nondisplaced. Regular rate & rhythm. No rubs, gallops or murmurs. Lungs: clear Extremities: no cyanosis,  clubbing, rash, edema. + UNNA boots. PICC RUE. RR a line Neuro: alert & oriented x 3. Moves all 4 extremities w/o difficulty. Affect pleasant.  Telemetry   NSR 90s 0-3 PVCs/min (Personally reviewed)    Labs    CBC Recent Labs    07/27/23 0500 07/27/23 1608 07/28/23 0512  WBC 20.9*  --  18.4*  HGB 7.6* 11.9* 7.6*  HCT 24.2* 35.0* 24.2*  MCV 96.0  --  94.9  PLT 212  --  307   Basic Metabolic Panel Recent Labs    40/98/11 0500 07/27/23 1503 07/27/23 1608 07/28/23 0512  NA 137  133* 134* 135 133*  K 4.4  4.3 4.4 4.5 4.6  CL 102  102 102  --  103  CO2 17*  20* 16*  --  18*  GLUCOSE 132*  136* 148*  --  170*  BUN 75*  75* 90*  --  116*  CREATININE 5.69*  5.80* 7.40*  --  8.93*  CALCIUM 8.0*  7.8* 7.9*  --  7.9*  MG 2.3  --   --  2.6*  PHOS 4.5  4.5 4.1  --  4.3   Liver Function Tests Recent Labs    07/27/23 0500 07/27/23 1503 07/28/23 0512  AST 46*  --  42*  ALT 27  --  26  ALKPHOS 118  --  115  BILITOT 1.0  --  1.1  PROT 6.8  --  6.5  ALBUMIN 2.2*  2.1* 2.1* 2.1*  2.1*   BNP: BNP (last 3 results) Recent Labs    07/09/23 1934 07/10/23 1224  BNP 703.7* 980.5*   Thyroid Function Tests No results for input(s): "TSH", "T4TOTAL", "T3FREE", "THYROIDAB" in the last 72 hours.  Invalid input(s): "FREET3"  Medications:   Scheduled Medications:  acetaminophen  650 mg Oral Q6H   amiodarone  200 mg Oral BID   arformoterol  15 mcg Nebulization BID   ascorbic acid  250 mg Oral BID   aspirin  81 mg Oral Daily   budesonide (PULMICORT) nebulizer solution  0.25 mg Nebulization BID   Chlorhexidine Gluconate Cloth  6 each Topical Q0600   darbepoetin (ARANESP) injection - DIALYSIS  200 mcg Subcutaneous Q Fri-1800   feeding supplement (PROSource TF20)  60 mL Per Tube BID   fiber supplement (BANATROL TF)  60 mL Per Tube BID   folic acid  1 mg Oral Daily   insulin aspart  0-20 Units Subcutaneous Q4H   loperamide HCl  4 mg Per Tube Once   methimazole  10 mg  Oral Daily   metoCLOPramide (REGLAN) injection  10 mg Intravenous Q8H   mexiletine  250 mg Oral Q12H   multivitamin  1 tablet Oral QHS   mouth rinse  15 mL Mouth Rinse 4 times per day   revefenacin  175 mcg Nebulization Daily   sodium chloride flush  10-40 mL Intracatheter Q12H   sodium chloride flush  3 mL Intravenous Q12H   sodium chloride HYPERTONIC  4 mL Nebulization BID   thiamine  100 mg Per Tube Daily   Or   thiamine  100 mg Intravenous Daily   zinc sulfate (50mg  elemental zinc)  220 mg Oral Daily    Infusions:  anticoagulant sodium citrate     dexmedetomidine (PRECEDEX) IV infusion 0.4 mcg/kg/hr (07/28/23 0800)   feeding supplement (VITAL 1.5 CAL) 75 mL/hr at 07/28/23 0800   heparin 1,900 Units/hr (07/28/23 0800)    PRN Medications: alteplase, anticoagulant sodium citrate, chlorpheniramine-HYDROcodone, heparin, heparin, heparin, HYDROmorphone (DILAUDID) injection, labetalol, lactulose, lidocaine (PF), lidocaine-prilocaine, loperamide HCl **FOLLOWED BY** loperamide HCl, LORazepam, ondansetron (ZOFRAN) IV, mouth rinse, mouth rinse, oxyCODONE, pentafluoroprop-tetrafluoroeth, polyethylene glycol, sodium chloride flush Patient Profile   Patient with a history of Grave's disease, severe primary mitral regurgitation who presented with chest pain and segmental PE. Subsequent progressed to SCAI Stage E shock requiring VA ECMO cannulation on 2/27.   Assessment/Plan  SCAI Stage E Cardiogenic shock - Suspect hypoxic respiratory failure driven with segmental PE on top of severe MR, nodal blockage, and thyrotoxicosis - Down time ~ 20 minutes, good mental status confirmed post code - We performed a mini-turn down on 07/15/23 at bedside; ECMO flows were reduced in a stepwise pattern by 0.5LPM to a goal of 2.5LPM. During this time, impella 5.5 flows increased to maintain net even flows. With reduction of ECMO flows, patient had onset of frequent PVCs/NSVT requiring amio boluses. In addition,  despite increased LV unloading patient continued to have severe 4+ MR with progressive RV failure on bedside TTE.  - Underwent successful mTEER on 07/16/23 with placement of x3 clips and improvement in MR from severe to mild. Mean mitral gradient of 3-90mmHg.  - Decannulated from V-A ECMO by Dr. Karin Lieu on 07/19/23 with vascular cut down and primary repair of left femoral artery.  - Echo 3/10: EF 35-40%, anterior and septal HK, normal RV, s/p Mitraclip with mild MR and  mean gradient 7 mmHg.  - Impella removed 3/12.  - CVVH stopped 3/15. Nephrology managing. Plan for iHD today.  - MAP and co-ox stable off milrinone and pressors.   Severe mitral valve regurgitation - Noted on previous echocardiogram - Now s/p mTEER on 07/16/23; improvement in MR from severe to mild.  - No murmur on exam  Pulmonary hypertension - PA pressures reached 80s/30s with PCWP of ~10, TPG of ~51mmHg suggestive of PVR >5 on 07/17/22 - He is now off NO, swan is out. Overall data is not supportive with the use of sildenafil in fixed PH secondary to MS/MR.   VT/NSVT - Initially with frequent runs of NSVT likely secondary to impella 5.5 position. Impella now out.  - Now on mexiletine and amiodarone per tube.   - No further VT.   ID - Now off vancomycin/meropenem/micafungin  - WBCs 22.8 => 26 => 30 => 42 => 47 => 31 => 16.7>21>18. TMax 100.7 overnight - Bronchoscopies with thick brown secretions but no culture data.   - CVC has been in since 2/27. Will discuss with primary team but suspect we can pull today.   Anemia - Hgb 7.6 today, transfuse hgb < 7.   Acute renal failure/hyperkalemia - Nephrology following. Plan for iHD today  Thyrotoxicosis - Was not taking medications as they made him feel poorly - Suspect underlying low output heart failure due to mitral valve disease - On methimazole 10 tid.   Atrial fibrillation - Present on arrival, in the setting of PE and thyroid disease - sinus rhythm today.  - Continue  amio PO  Pulmonary embolism:  - Segmental without right heart strain - Heparin gtt.    CAD - Prior embolic infarct in the LAD territory with corresponding LGE on CMR - Lifelong anticoagulation - On heparin gtt here.   Acute hypoxemic respiratory failure - Extubated and reintubated 3/12.   - Thick secretions with complete opacification of left lung, now CXR improved with bronchoscopies and suction.   - Extubated 3/16  CVA - MRI head 3/13 with small infarcts right cerebral and cerebellar hemispheres. This should not significantly affect consciousness.  - Suspect embolic, on heparin gtt.  - Follows commands. Minimally responsive to questions though.    CRITICAL CARE Performed by: Alen Bleacher  Total critical care time: 15 minutes  Critical care time was exclusive of separately billable procedures and treating other patients.  Critical care was necessary to treat or prevent imminent or life-threatening deterioration.  Critical care was time spent personally by me on the following activities: development of treatment plan with patient and/or surrogate as well as nursing, discussions with consultants, evaluation of patient's response to treatment, examination of patient, obtaining history from patient or surrogate, ordering and performing treatments and interventions, ordering and review of laboratory studies, ordering and review of radiographic studies, pulse oximetry and re-evaluation of patient's condition.   Length of Stay: 19  Alen Bleacher, NP  07/28/2023, 10:39 AM  Advanced Heart Failure Team Pager [phone] (M-F; 7a - 5p)  Please contact [facility] for night-coverage after hours (5p -7a ) and weekends on [email or web address]  Agree with above.   Off pressors. Now getting iHD in his room.   Awake and alert. Following commands.   Co-ox 79%  Rhythm stable on amio and mexilitene   On heparin. No bleeding  General:  Lying in bed weak No resp difficulty HEENT: normal Neck:  supple. JVP 6-7 Carotids 2+ bilat; no bruits. No lymphadenopathy or  thryomegaly appreciated. Cor: PMI nondisplaced. Regular rate & rhythm. 2/6 TR. No MR  Impella suture site ok  Lungs: clear Abdomen: soft, nontender, nondistended. No hepatosplenomegaly. No bruits or masses. Good bowel sounds. Extremities: no cyanosis, clubbing, rash, edema Neuro: alert  but weak. Follows commands   Remains very weak but now off pressors with stable co-ox. Rhythm stable. Remains on HD.   Continue current care. Will need aggressive PT/OT. Treat hyperthyroidism   CRITICAL CARE Performed by: Arvilla Meres  Total critical care time: 40 minutes  Critical care time was exclusive of separately billable procedures and treating other patients.  Critical care was necessary to treat or prevent imminent or life-threatening deterioration.  Critical care was time spent personally by me (independent of midlevel providers or residents) on the following activities: development of treatment plan with patient and/or surrogate as well as nursing, discussions with consultants, evaluation of patient's response to treatment, examination of patient, obtaining history from patient or surrogate, ordering and performing treatments and interventions, ordering and review of laboratory studies, ordering and review of radiographic studies, pulse oximetry and re-evaluation of patient's condition.  Arvilla Meres, MD  6:19 PM

## 2023-07-28 NOTE — Progress Notes (Signed)
 PHARMACY - ANTICOAGULATION CONSULT NOTE  Pharmacy Consult for Heparin  Indication: pulmonary embolus, s/p ECMO decannulation 3/7, Impella removal 3/12  No Known Allergies  Patient Measurements: Height: 6\' 4"  (193 cm) Weight: 74 kg (163 lb 2.3 oz) IBW/kg (Calculated) : 86.8 Heparin Dosing Weight: n/a  Vital Signs: Temp: 100.2 F (37.9 C) (03/17 0823) Temp Source: Axillary (03/17 0823) BP: 122/77 (03/17 0930) Pulse Rate: 86 (03/17 0800)  Labs: Recent Labs    07/26/23 0437 07/26/23 0443 07/27/23 0500 07/27/23 1503 07/27/23 1608 07/27/23 2304 07/28/23 0512 07/28/23 0902  HGB 7.5*   < > 7.6*  --  11.9*  --  7.6*  --   HCT 23.8*   < > 24.2*  --  35.0*  --  24.2*  --   PLT 185  --  212  --   --   --  307  --   HEPARINUNFRC 0.50  --  1.02* 0.15*  --  0.24* 0.35 0.35  CREATININE 3.39*  3.24*   < > 5.69*  5.80* 7.40*  --   --  8.93*  --    < > = values in this interval not displayed.    Estimated Creatinine Clearance: 11.7 mL/min (A) (by C-G formula based on SCr of 8.93 mg/dL (H)).   Medical History: Past Medical History:  Diagnosis Date   Atrial fibrillation [facility])    Hyperthyroidism    Mitral regurgitation    NSTEMI (non-ST elevated myocardial infarction) [facility])     Assessment: 38 yo M with bilateral PE s/p TNK (2/27 @1156 ) now on [place] ECMO + Impella. Pharmacy consulted for bivalirudin for anticoagulation.   S/p impella CP >5.5 3/3 - bivalirudin was previously held with oozing at insertion site but restart 3/4. Patient underwent mitraclip 3/5.  Bival transitioned to heparin 3/7 after decannulation.  Impella 5.5 removed 3/13. Heparin restarted 3/13. Hep in CRRT stopped 3/15 with CRRT stopping.   Heparin level came back therapeutic at 0.35, on 1900 units/hr. Hgb 7.6, plt 307. Fibrinogen 651, LDH 427. No s/sx of bleeding or infusion issues.    Goal of Therapy:  Heparin level 0.3-0.7 units/mL Monitor platelets by anticoagulation protocol: Yes   Plan:  Continue heparin  infusion at 1900 units/h Heparin level and CBC daily  Monitor s/sx of bleeding   Thank you for allowing pharmacy to participate in this patient's care,  Sherron Monday, PharmD, BCCCP Clinical Pharmacist  Phone: [phone] 07/28/2023 10:53 AM  Please check AMION for all [facility] Pharmacy phone numbers After 10:00 PM, call Main Pharmacy [phone]

## 2023-07-28 NOTE — Progress Notes (Signed)
 OT Cancellation Note  Patient Details Name: Aaron [name] MRN: [id number] DOB: 24-Oct-1985   Cancelled Treatment:    Reason Eval/Treat Not Completed: Patient at procedure or test/ unavailable Starting HD. Will follow up as schedule permits for OT eval.  Lorre Munroe 07/28/2023, 8:27 AM

## 2023-07-29 ENCOUNTER — Inpatient Hospital Stay (HOSPITAL_COMMUNITY)

## 2023-07-29 DIAGNOSIS — R570 Cardiogenic shock: Secondary | ICD-10-CM | POA: Diagnosis not present

## 2023-07-29 DIAGNOSIS — E872 Acidosis, unspecified: Secondary | ICD-10-CM | POA: Diagnosis not present

## 2023-07-29 DIAGNOSIS — J9601 Acute respiratory failure with hypoxia: Secondary | ICD-10-CM | POA: Diagnosis not present

## 2023-07-29 DIAGNOSIS — R000 Tachycardia, unspecified: Secondary | ICD-10-CM | POA: Diagnosis not present

## 2023-07-29 DIAGNOSIS — E0590 Thyrotoxicosis, unspecified without thyrotoxic crisis or storm: Secondary | ICD-10-CM | POA: Diagnosis not present

## 2023-07-29 DIAGNOSIS — I469 Cardiac arrest, cause unspecified: Secondary | ICD-10-CM | POA: Diagnosis not present

## 2023-07-29 DIAGNOSIS — E875 Hyperkalemia: Secondary | ICD-10-CM | POA: Diagnosis not present

## 2023-07-29 DIAGNOSIS — R0602 Shortness of breath: Secondary | ICD-10-CM | POA: Diagnosis not present

## 2023-07-29 DIAGNOSIS — R079 Chest pain, unspecified: Secondary | ICD-10-CM | POA: Diagnosis not present

## 2023-07-29 DIAGNOSIS — I2699 Other pulmonary embolism without acute cor pulmonale: Secondary | ICD-10-CM | POA: Diagnosis not present

## 2023-07-29 DIAGNOSIS — N170 Acute kidney failure with tubular necrosis: Secondary | ICD-10-CM | POA: Diagnosis not present

## 2023-07-29 LAB — HEPATIC FUNCTION PANEL
ALT: 147 U/L — ABNORMAL HIGH (ref 0–44)
AST: 396 U/L — ABNORMAL HIGH (ref 15–41)
Albumin: 2.2 g/dL — ABNORMAL LOW (ref 3.5–5.0)
Alkaline Phosphatase: 500 U/L — ABNORMAL HIGH (ref 38–126)
Bilirubin, Direct: 0.6 mg/dL — ABNORMAL HIGH (ref 0.0–0.2)
Indirect Bilirubin: 0.3 mg/dL (ref 0.3–0.9)
Total Bilirubin: 0.9 mg/dL (ref 0.0–1.2)
Total Protein: 6.6 g/dL (ref 6.5–8.1)

## 2023-07-29 LAB — CBC
HCT: 26.2 % — ABNORMAL LOW (ref 39.0–52.0)
Hemoglobin: 8.3 g/dL — ABNORMAL LOW (ref 13.0–17.0)
MCH: 29.9 pg (ref 26.0–34.0)
MCHC: 31.7 g/dL (ref 30.0–36.0)
MCV: 94.2 fL (ref 80.0–100.0)
Platelets: 357 10*3/uL (ref 150–400)
RBC: 2.78 MIL/uL — ABNORMAL LOW (ref 4.22–5.81)
RDW: 19.9 % — ABNORMAL HIGH (ref 11.5–15.5)
WBC: 17.6 10*3/uL — ABNORMAL HIGH (ref 4.0–10.5)
nRBC: 0.5 % — ABNORMAL HIGH (ref 0.0–0.2)

## 2023-07-29 LAB — HEPARIN LEVEL (UNFRACTIONATED): Heparin Unfractionated: 0.37 [IU]/mL (ref 0.30–0.70)

## 2023-07-29 LAB — BASIC METABOLIC PANEL
Anion gap: 14 (ref 5–15)
BUN: 90 mg/dL — ABNORMAL HIGH (ref 6–20)
CO2: 21 mmol/L — ABNORMAL LOW (ref 22–32)
Calcium: 7.7 mg/dL — ABNORMAL LOW (ref 8.9–10.3)
Chloride: 97 mmol/L — ABNORMAL LOW (ref 98–111)
Creatinine, Ser: 7.2 mg/dL — ABNORMAL HIGH (ref 0.61–1.24)
GFR, Estimated: 9 mL/min — ABNORMAL LOW (ref >60)
Glucose, Bld: 151 mg/dL — ABNORMAL HIGH (ref 70–99)
Potassium: 4.6 mmol/L (ref 3.5–5.1)
Sodium: 132 mmol/L — ABNORMAL LOW (ref 135–145)

## 2023-07-29 LAB — HEPATITIS B SURFACE ANTIBODY, QUANTITATIVE: Hep B S AB Quant (Post): <3.5 m[IU]/mL — ABNORMAL LOW

## 2023-07-29 LAB — GLUCOSE, CAPILLARY
Glucose-Capillary: 132 mg/dL — ABNORMAL HIGH (ref 70–99)
Glucose-Capillary: 137 mg/dL — ABNORMAL HIGH (ref 70–99)
Glucose-Capillary: 142 mg/dL — ABNORMAL HIGH (ref 70–99)
Glucose-Capillary: 157 mg/dL — ABNORMAL HIGH (ref 70–99)
Glucose-Capillary: 159 mg/dL — ABNORMAL HIGH (ref 70–99)
Glucose-Capillary: 170 mg/dL — ABNORMAL HIGH (ref 70–99)

## 2023-07-29 LAB — MAGNESIUM: Magnesium: 2.3 mg/dL (ref 1.7–2.4)

## 2023-07-29 LAB — PHOSPHORUS: Phosphorus: 5 mg/dL — ABNORMAL HIGH (ref 2.5–4.6)

## 2023-07-29 MED ORDER — ALTEPLASE 2 MG IJ SOLR
2.0000 mg | Freq: Once | INTRAMUSCULAR | Status: AC
  Administered 2023-07-29: 2 mg

## 2023-07-29 MED ORDER — ACETAMINOPHEN 325 MG PO TABS
650.0000 mg | ORAL_TABLET | Freq: Four times a day (QID) | ORAL | Status: DC | PRN
  Administered 2023-07-30 – 2023-07-31 (×2): 650 mg via ORAL
  Filled 2023-07-29 (×2): qty 2

## 2023-07-29 NOTE — Progress Notes (Signed)
 Physical Therapy Treatment Patient Details Name: Aaron [name] MRN: [id number] DOB: 03-15-1986 Today's Date: 07/29/2023   History of Present Illness Aaron [name] is a 38 y.o. male who presented to the ED on2/26  for evaluation of chest pain and dyspnea. CTA revealed semental bilat lower lob pulmonary emboli.  2/27 had VT/VF arrest, requiring of CPR and multiple shocks. Place on [place] ECMO with impella CP. Impella removed 3/12, extubated 3/16. acute R hemisphere strokes on MRI 3/13  PMH:  PAF not adherent to Eliquis, chronic HFpEF, severe mitral regurgitation, hyperthyroidism    PT Comments  Patient seen in co-treatment with OT due to low-level functional ability and ?tolerance for separate sessions. Patient able to come to sit EOB with +2 min assist with HOB elevated. Sat EOB with min-mod assist ~12 minutes with LOB in various directions. Able to assist with lateral scoot with weight-bearing thru bil LEs and clearing hips off mattress. Scoot x 4 reps. Pt in chair position in bed upon completion.   BP supine 142/86 HR 93 Sats 96% on RA BP sitting 134/87        93          96% BP sitting 153/96 BP supine 148/94        93         96%     If plan is discharge home, recommend the following: Two people to help with walking and/or transfers;Two people to help with bathing/dressing/bathroom;Assistance with cooking/housework;Assistance with feeding;Assist for transportation;Help with stairs or ramp for entrance   Can travel by private vehicle        Equipment Recommendations   (TBD)    Recommendations for Other Services       Precautions / Restrictions Precautions Precautions: Fall     Mobility  Bed Mobility Overal bed mobility: Needs Assistance Bed Mobility: Rolling, Supine to Sit, Sit to Supine Rolling: Mod assist, Used rails   Supine to sit: +2 for physical assistance, Min assist, +2 for safety/equipment, HOB elevated Sit to supine: Mod assist, +2 for physical assistance    General bed mobility comments: assist to initiate moving legs over EOB, raise torso and scoot to EOB; assist to raise legs and control trunk to supine    Transfers Overall transfer level: Needs assistance Equipment used: None              Lateral/Scoot Transfers: Mod assist, +2 physical assistance General transfer comment: lateral scoot to his left with bed pad; pt able to clear buttocks but assist to laterally move hips    Ambulation/Gait               General Gait Details: unable to complete safely   Stairs             Wheelchair Mobility     Tilt Bed    Modified Rankin (Stroke Patients Only)       Balance Overall balance assessment: Needs assistance Sitting-balance support: Feet unsupported, Bilateral upper extremity supported Sitting balance-Leahy Scale: Poor Sitting balance - Comments: EOB ~12 minutes with varying min-mod assist for balance; seated rest with pt selecting to forward lean and prop on forearms on thighs, able to pull back up to vertical with CGA Postural control: Right lateral lean, Left lateral lean                                  Communication Communication Communication: Impaired Factors  Affecting Communication: Reduced clarity of speech  Cognition Arousal: Alert Behavior During Therapy: Flat affect   PT - Cognitive impairments: Sequencing, Problem solving, Attention, Initiation                       PT - Cognition Comments: pt with difficulty initiating and sequencing mobility Following commands: Impaired Following commands impaired: Follows one step commands with increased time    Cueing Cueing Techniques: Verbal cues, Tactile cues, Gestural cues  Exercises General Exercises - Lower Extremity Ankle Circles/Pumps: AAROM, Both, 10 reps, Seated    General Comments        Pertinent Vitals/Pain Pain Assessment Pain Assessment: Faces Faces Pain Scale: No hurt    Home Living                           Prior Function            PT Goals (current goals can now be found in the care plan section) Acute Rehab PT Goals Patient Stated Goal: home Time For Goal Achievement: 08/10/23 Potential to Achieve Goals: Good Progress towards PT goals: Progressing toward goals    Frequency    Min 2X/week      PT Plan      Co-evaluation PT/OT/SLP Co-Evaluation/Treatment: Yes Reason for Co-Treatment: Complexity of the patient's impairments (multi-system involvement);For patient/therapist safety;To address functional/ADL transfers PT goals addressed during session: Mobility/safety with mobility;Balance        AM-PAC PT "6 Clicks" Mobility   Outcome Measure  Help needed turning from your back to your side while in a flat bed without using bedrails?: A Lot Help needed moving from lying on your back to sitting on the side of a flat bed without using bedrails?: Total Help needed moving to and from a bed to a chair (including a wheelchair)?: Total Help needed standing up from a chair using your arms (e.g., wheelchair or bedside chair)?: Total Help needed to walk in hospital room?: Total Help needed climbing 3-5 steps with a railing? : Total 6 Click Score: 7    End of Session   Activity Tolerance: Patient tolerated treatment well Patient left: in bed;with call bell/phone within reach;with bed alarm set (in chair position) Nurse Communication: Mobility status (chair position in bed) PT Visit Diagnosis: Unsteadiness on feet (R26.81);Muscle weakness (generalized) (M62.81);Difficulty in walking, not elsewhere classified (R26.2)     Time: 1610-9604 PT Time Calculation (min) (ACUTE ONLY): 26 min  Charges:    $Therapeutic Activity: 8-22 mins PT General Charges $$ ACUTE PT VISIT: 1 Visit                      Jerolyn Center, PT Acute Rehabilitation Services  Office [phone]    Zena Amos 07/29/2023, 9:52 AM

## 2023-07-29 NOTE — Evaluation (Signed)
 Occupational Therapy Evaluation Patient Details Name: Aaron [name] MRN: [id number] DOB: Apr 21, 1986 Today's Date: 07/29/2023   History of Present Illness   Aaron [name] is a 38 y.o. male who presented to the ED on2/26  for evaluation of chest pain and dyspnea. CTA revealed semental bilat lower lob pulmonary emboli.  2/27 had VT/VF arrest, requiring of CPR and multiple shocks. Place on [place] ECMO with impella CP. Impella removed 3/12, extubated 3/16. acute R hemisphere strokes on MRI 3/13  PMH:  PAF not adherent to Eliquis, chronic HFpEF, severe mitral regurgitation, hyperthyroidism     Clinical Impressions PTA, pt lives with fiance, typically completely independent and works as a Paediatric nurse. Pt presents now with deficits in strength, edema, coordination, balance and cognition. Overall, pt requires Min A x 2 for bed mobility, Mod A x 2 for lateral scooting and intermittent Min A to maintain sitting balance EOB. Pt requires Mod-Max A for ADLs d/t above deficits but showing good initiation for problem solving tasks. Based on high PLOF, feel pt will progress well with intensive rehab services.  SpO2 98% on RA. BP 130s-150s/90s HR 90s     If plan is discharge home, recommend the following:   A lot of help with walking and/or transfers;Two people to help with walking and/or transfers;A lot of help with bathing/dressing/bathroom;Two people to help with bathing/dressing/bathroom     Functional Status Assessment   Patient has had a recent decline in their functional status and demonstrates the ability to make significant improvements in function in a reasonable and predictable amount of time.     Equipment Recommendations   Other (comment) (TBD pending progress)     Recommendations for Other Services   Rehab consult     Precautions/Restrictions   Precautions Precautions: Fall Precaution/Restrictions Comments: flexiseal, cortrak Restrictions Weight Bearing Restrictions Per  Provider Order: No     Mobility Bed Mobility Overal bed mobility: Needs Assistance Bed Mobility: Rolling, Supine to Sit, Sit to Supine Rolling: Mod assist, Used rails   Supine to sit: +2 for physical assistance, Min assist, +2 for safety/equipment, HOB elevated Sit to supine: Mod assist, +2 for physical assistance   General bed mobility comments: assist to initiate moving legs over EOB, raise torso and scoot to EOB; assist to raise legs and control trunk to supine    Transfers Overall transfer level: Needs assistance Equipment used: None              Lateral/Scoot Transfers: Mod assist, +2 physical assistance General transfer comment: lateral scoot to his left with bed pad; pt able to clear buttocks but assist to laterally move hips      Balance Overall balance assessment: Needs assistance Sitting-balance support: Feet unsupported, Bilateral upper extremity supported Sitting balance-Leahy Scale: Poor Sitting balance - Comments: EOB ~12 minutes with varying min-mod assist for balance; seated rest with pt selecting to forward lean and prop on forearms on thighs, able to pull back up to vertical with CGA Postural control: Right lateral lean, Left lateral lean                                 ADL either performed or assessed with clinical judgement   ADL Overall ADL's : Needs assistance/impaired Eating/Feeding: NPO   Grooming: Minimal assistance;Moderate assistance;Sitting;Wash/dry face Grooming Details (indicate cue type and reason): Min A for washing face. support provided under elbow due to pt difficulty lifting to face. pt  then brought face down to where hand/forearm propped on LE for washing eyes/forehead. will need increased assist for multi step ADLs Upper Body Bathing: Sitting;Moderate assistance   Lower Body Bathing: Total assistance;Sitting/lateral leans;Bed level   Upper Body Dressing : Moderate assistance;Sitting   Lower Body Dressing: Total  assistance;Sitting/lateral leans;Bed level       Toileting- Clothing Manipulation and Hygiene: Total assistance;Bed level               Vision Ability to See in Adequate Light: 0 Adequate Patient Visual Report: No change from baseline Vision Assessment?: No apparent visual deficits     Perception         Praxis         Pertinent Vitals/Pain Pain Assessment Pain Assessment: No/denies pain Faces Pain Scale: No hurt     Extremity/Trunk Assessment Upper Extremity Assessment Upper Extremity Assessment: Generalized weakness;Right hand dominant;RUE deficits/detail;LUE deficits/detail RUE Deficits / Details: edema noted and impaired coordination. profound weakness. does nod head yes when asked if hand and forearm felt numb RUE Sensation: decreased light touch RUE Coordination: decreased fine motor;decreased gross motor LUE Deficits / Details: edema noted and impaired coordination. profound weakness LUE Sensation: decreased light touch LUE Coordination: decreased fine motor;decreased gross motor   Lower Extremity Assessment Lower Extremity Assessment: Defer to PT evaluation   Cervical / Trunk Assessment Cervical / Trunk Assessment: Normal   Communication Communication Communication: Impaired Factors Affecting Communication: Reduced clarity of speech   Cognition Arousal: Alert Behavior During Therapy: Flat affect Cognition: Difficult to assess Difficult to assess due to: Impaired communication           OT - Cognition Comments: able to answer orientation questions. follows commands with delay at times. showing some insight into deficits. fair initiation                 Following commands: Impaired Following commands impaired: Follows one step commands with increased time     Cueing  General Comments   Cueing Techniques: Verbal cues;Tactile cues;Gestural cues      Exercises     Shoulder Instructions      Home Living Family/patient expects to be  discharged to:: Private residence Living Arrangements: Spouse/significant other Available Help at Discharge: Family;Available 24 hours/day Type of Home: House Home Access: Level entry     Home Layout: One level     Bathroom Shower/Tub: Chief Strategy Officer: Standard     Home Equipment: None          Prior Functioning/Environment Prior Level of Function : Independent/Modified Independent;Working/employed;Driving             Mobility Comments: works as a Paediatric nurse ADLs Comments: Indep    OT Problem List: Decreased strength;Decreased activity tolerance;Impaired balance (sitting and/or standing);Decreased coordination;Decreased cognition;Impaired sensation;Impaired UE functional use;Increased edema   OT Treatment/Interventions: Self-care/ADL training;Therapeutic exercise;Energy conservation;DME and/or AE instruction;Therapeutic activities;Patient/family education;Balance training      OT Goals(Current goals can be found in the care plan section)   Acute Rehab OT Goals Patient Stated Goal: agreeable to sit EOB; wants to be able to go to the bathroom OT Goal Formulation: With patient Time For Goal Achievement: 08/12/23 Potential to Achieve Goals: Good ADL Goals Pt Will Perform Grooming: with set-up;sitting Pt Will Perform Upper Body Bathing: with set-up;sitting Pt Will Perform Lower Body Bathing: with mod assist;sitting/lateral leans;sit to/from stand Pt Will Transfer to Toilet: with mod assist;stand pivot transfer;bedside commode Pt/caregiver will Perform Home Exercise Program: Increased strength;Both right and left  upper extremity;With theraband;With Supervision;With written HEP provided   OT Frequency:  Min 1X/week    Co-evaluation   Reason for Co-Treatment: Complexity of the patient's impairments (multi-system involvement);For patient/therapist safety;To address functional/ADL transfers PT goals addressed during session: Mobility/safety with  mobility;Balance        AM-PAC OT "6 Clicks" Daily Activity     Outcome Measure Help from another person eating meals?: Total Help from another person taking care of personal grooming?: A Lot Help from another person toileting, which includes using toliet, bedpan, or urinal?: Total Help from another person bathing (including washing, rinsing, drying)?: A Lot Help from another person to put on and taking off regular upper body clothing?: A Lot Help from another person to put on and taking off regular lower body clothing?: Total 6 Click Score: 9   End of Session Nurse Communication: Mobility status  Activity Tolerance: Patient tolerated treatment well Patient left: in bed;with call bell/phone within reach;with bed alarm set  OT Visit Diagnosis: Unsteadiness on feet (R26.81);Other abnormalities of gait and mobility (R26.89);Muscle weakness (generalized) (M62.81)                Time: 2725-3664 OT Time Calculation (min): 25 min Charges:  OT General Charges $OT Visit: 1 Visit OT Evaluation $OT Eval Moderate Complexity: 1 Mod  Bradd Canary, OTR/L Acute Rehab Services Office: [phone]   Lorre Munroe 07/29/2023, 10:18 AM

## 2023-07-29 NOTE — Plan of Care (Signed)
  Problem: Education: Goal: Knowledge of General Education information will improve Description: Including pain rating scale, medication(s)/side effects and non-pharmacologic comfort measures Outcome: Progressing   Problem: Clinical Measurements: Goal: Respiratory complications will improve Outcome: Progressing Goal: Cardiovascular complication will be avoided Outcome: Progressing   Problem: Nutrition: Goal: Adequate nutrition will be maintained Outcome: Progressing   Problem: Coping: Goal: Level of anxiety will decrease Outcome: Progressing

## 2023-07-29 NOTE — Progress Notes (Addendum)
 Patient ID: Aaron [name], male   DOB: 1986-01-19, 38 y.o.   MRN: [id number]     Advanced Heart Failure Rounding Note  Cardiologist: Christell Constant, MD  Chief Complaint: V-A ECMO  Subjective:   - Admitted 2/26 with worsening SOB, CP and a fib with RVR. - Decompensated 2/27 with IVF, nodal blockade with subsequent respiratory arrest, ROSC achieved after ~16 minute down time - Femoral VA ecmo cannulation 2/27 with Impella CP vent - ECMO decannulation 3/8 - Impella removed and extubated 3/12.  Had to be reintubated with mucus plugging and left lung opacification.  - MRI head 3/13 with small infarcts right cerebral and cerebellar hemispheres - Re-extubated 3/16  Stable off inotrope support. Central lines have been removed. CRRT stopped 3/15. Had first iHD yesterday.  He remains on mexiletine and amiodarone.  Remains in NSR this morning.    Much more alert this morning. Nods appropriately to questions. Sat on EOB this am.  Objective:   Echo (3/10): EF 35-40%, anterior and septal HK, normal RV, s/p Mitraclip with mild MR and mean gradient 7 mmHg.   Weight Range: 74 kg Body mass index is 19.86 kg/m.   Vital Signs:   Temp:  [98.4 F (36.9 C)-99.4 F (37.4 C)] 98.6 F (37 C) (03/18 0836) Pulse Rate:  [74-102] 88 (03/18 0800) Resp:  [0-38] 0 (03/18 0800) BP: (114-144)/(67-85) 134/81 (03/18 0800) SpO2:  [93 %-100 %] 97 % (03/18 0800) Last BM Date : 07/29/23  Weight change: Filed Weights   07/27/23 0500 07/28/23 0328 07/28/23 0830  Weight: 73.8 kg 74 kg 74 kg    Intake/Output:   Intake/Output Summary (Last 24 hours) at 07/29/2023 0956 Last data filed at 07/29/2023 0800 Gross per 24 hour  Intake 2583.28 ml  Output 740 ml  Net 1843.28 ml    Physical Exam  General:  weak appearing.   HEENT: + cortrak Neck: supple. JVD flat.  [facility] HD cath Cor: PMI nondisplaced. Regular rate & rhythm. No rubs, gallops or murmurs. Lungs: coarse on expiration Extremities: no cyanosis,  clubbing, rash, edema. Neuro: Nods appropriately to questions and follows commands. Moves all 4 extremities w/o difficulty. Affect flat.  Telemetry   NSR 80s (Personally reviewed)    Labs    CBC Recent Labs    07/28/23 0512 07/29/23 0427  WBC 18.4* 17.6*  HGB 7.6* 8.3*  HCT 24.2* 26.2*  MCV 94.9 94.2  PLT 307 357   Basic Metabolic Panel Recent Labs    95/63/87 0512 07/29/23 0427  NA 133* 132*  K 4.6 4.6  CL 103 97*  CO2 18* 21*  GLUCOSE 170* 151*  BUN 116* 90*  CREATININE 8.93* 7.20*  CALCIUM 7.9* 7.7*  MG 2.6* 2.3  PHOS 4.3 5.0*   Liver Function Tests Recent Labs    07/28/23 0512 07/29/23 0427  AST 42* 396*  ALT 26 147*  ALKPHOS 115 500*  BILITOT 1.1 0.9  PROT 6.5 6.6  ALBUMIN 2.1*  2.1* 2.2*   BNP: BNP (last 3 results) Recent Labs    07/09/23 1934 07/10/23 1224  BNP 703.7* 980.5*   Thyroid Function Tests No results for input(s): "TSH", "T4TOTAL", "T3FREE", "THYROIDAB" in the last 72 hours.  Invalid input(s): "FREET3"  Medications:   Scheduled Medications:  acetaminophen  650 mg Oral Q6H   amiodarone  200 mg Oral BID   arformoterol  15 mcg Nebulization BID   ascorbic acid  250 mg Oral BID   aspirin  81 mg Oral Daily  budesonide (PULMICORT) nebulizer solution  0.25 mg Nebulization BID   Chlorhexidine Gluconate Cloth  6 each Topical Q0600   darbepoetin (ARANESP) injection - DIALYSIS  200 mcg Subcutaneous Q Fri-1800   feeding supplement (PROSource TF20)  60 mL Per Tube BID   fiber supplement (BANATROL TF)  60 mL Per Tube BID   folic acid  1 mg Oral Daily   insulin aspart  0-20 Units Subcutaneous Q4H   methimazole  10 mg Oral Daily   mexiletine  250 mg Oral Q12H   multivitamin  1 tablet Oral QHS   mouth rinse  15 mL Mouth Rinse 4 times per day   revefenacin  175 mcg Nebulization Daily   sodium chloride flush  10-40 mL Intracatheter Q12H   sodium chloride flush  3 mL Intravenous Q12H   sodium chloride HYPERTONIC  4 mL Nebulization BID    thiamine  100 mg Per Tube Daily   Or   thiamine  100 mg Intravenous Daily   zinc sulfate (50mg  elemental zinc)  220 mg Oral Daily    Infusions:  anticoagulant sodium citrate     dexmedetomidine (PRECEDEX) IV infusion Stopped (07/28/23 1354)   feeding supplement (VITAL 1.5 CAL) 75 mL/hr at 07/29/23 0800   heparin 1,900 Units/hr (07/29/23 0800)    PRN Medications: alteplase, anticoagulant sodium citrate, chlorpheniramine-HYDROcodone, heparin, heparin, heparin, HYDROmorphone (DILAUDID) injection, labetalol, lactulose, lidocaine (PF), lidocaine-prilocaine, [COMPLETED] loperamide HCl **FOLLOWED BY** loperamide HCl, ondansetron (ZOFRAN) IV, mouth rinse, mouth rinse, oxyCODONE, pentafluoroprop-tetrafluoroeth, polyethylene glycol, sodium chloride flush Patient Profile   Patient with a history of Grave's disease, severe primary mitral regurgitation who presented with chest pain and segmental PE. Subsequent progressed to SCAI Stage E shock requiring VA ECMO cannulation on 2/27.   Assessment/Plan  SCAI Stage E Cardiogenic shock - Suspect hypoxic respiratory failure driven with segmental PE on top of severe MR, nodal blockage, and thyrotoxicosis - Down time ~ 20 minutes, good mental status confirmed post code - We performed a mini-turn down on 07/15/23 at bedside; ECMO flows were reduced in a stepwise pattern by 0.5LPM to a goal of 2.5LPM. During this time, impella 5.5 flows increased to maintain net even flows. With reduction of ECMO flows, patient had onset of frequent PVCs/NSVT requiring amio boluses. In addition, despite increased LV unloading patient continued to have severe 4+ MR with progressive RV failure on bedside TTE.  - Underwent successful mTEER on 07/16/23 with placement of x3 clips and improvement in MR from severe to mild. Mean mitral gradient of 3-23mmHg.  - Decannulated from V-A ECMO by Dr. Karin Lieu on 07/19/23 with vascular cut down and primary repair of left femoral artery.  - Echo 3/10: EF  35-40%, anterior and septal HK, normal RV, s/p Mitraclip with mild MR and mean gradient 7 mmHg.  - Impella removed 3/12.  - CVVH stopped 3/15. Nephrology managing. Now on iHD.  - MAP and co-ox stable off milrinone and pressors.   Severe mitral valve regurgitation - Noted on previous echocardiogram - Now s/p mTEER on 07/16/23; improvement in MR from severe to mild.  - No murmur on exam  Pulmonary hypertension - PA pressures reached 80s/30s with PCWP of ~10, TPG of ~3mmHg suggestive of PVR >5 on 07/17/22 - He is now off NO, swan is out. Overall data is not supportive with the use of sildenafil in fixed PH secondary to MS/MR.   VT/NSVT - Initially with frequent runs of NSVT likely secondary to impella 5.5 position. Impella now out.  -  Now on mexiletine and amiodarone per tube.   - No further VT.   ID - Now off vancomycin/meropenem/micafungin  - WBCs 22.8> 26> 30> 42> 47> 31> 16.7>21>18. TMax 99.4 overnight - Bronchoscopies with thick brown secretions but no culture data.    Anemia - Hgb 8.3 today, transfuse hgb < 7.   Acute renal failure/hyperkalemia - Nephrology following. Had iHD yesterday. - Plan to transition to Eliquis when long term HD access in place.   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   Atrial fibrillation - Present on arrival, in the setting of PE and thyroid disease - sinus rhythm today.  - Continue amio PO  Pulmonary embolism:  - Segmental without right heart strain - Heparin gtt.    CAD - Prior embolic infarct in the LAD territory with corresponding LGE on CMR - Lifelong anticoagulation - On heparin gtt here.   Acute hypoxemic respiratory failure - Extubated and reintubated 3/12.   - Thick secretions with complete opacification of left lung, now CXR improved with bronchoscopies and suction.   - Extubated 3/16  CVA -  MRI head 3/13 with small infarcts right cerebral and cerebellar hemispheres. This should not significantly affect consciousness.  - Suspect embolic, on heparin gtt.  - Follows commands. Minimally responsive to questions though.    Length of Stay: 20  Alen Bleacher, NP  07/29/2023, 9:56 AM  Advanced Heart Failure Team Pager [phone] (M-F; 7a - 5p)  Please contact [facility] for night-coverage after hours (5p -7a ) and weekends on [email or web address]  Patient seen and examined with the above-signed Advanced Practice Provider and/or Housestaff. I personally reviewed laboratory data, imaging studies and relevant notes. I independently examined the patient and formulated the important aspects of the plan. I have edited the note to reflect any of my changes or salient points. I have personally discussed the plan with the patient and/or family.  Increasingly alert and interactive. Follows commands. Remains very weak.   Rhythm stable on amio and mexilitene  Tolerated iHD yesterday.  On heparin. No bleeding   General:  Weak appearing. No resp difficulty HEENT: normal + Cor-trak Neck: supple. no JVD. Carotids 2+ bilat; no bruits. No lymphadenopathy or thryomegaly appreciated. Cor: Regular rate & rhythm. 2/6 TR no MR Lungs: clear Abdomen: soft, nontender, nondistended. No hepatosplenomegaly. No bruits or masses. Good bowel sounds. Extremities: no cyanosis, clubbing, rash, edema Neuro: alert & oriented follows commands. Very weak   Continues to improve slowly. Main focus now will be rehab and awaiting renal recovery. Can switch to Eliquis once decision made on HD access.   Should we consult CIR?   Can go to 2C from my standpoint.   Arvilla Meres, MD  1:10 PM

## 2023-07-29 NOTE — Progress Notes (Signed)
 [facility] NEPHROLOGY PROGRESS NOTE  Assessment/ Plan: Pt is a 38 y.o. yo male  likely ATN after cardiac arrest, cardiogenic shock on ECMO ( decan 3/8) and Impella ( removed 3/12), AHRF/VDRF   # Dialysis dependent anuric AKI 2/2 ATN from cardiogenic shock on CRRT since 07/11/23, Left subclavian temp HD cath placed by CCM;  Tolerating CRRT well however getting interruptions because of procedures, imaging studies as well as clotting.  The potassium level was running high --  CRRT prescription was changed, then K in the 3's-  change again to 4 K pre and post and cont 2 K dialysate-  UF around 50 / hour-  volume does not seem terrible.   Clotting was an issue.  CRRT was stopped on 3/15.  No UOP and numbers rising.  Tolerated IHD 3/17; signs of recovery and we will plan on next treatment Wednesday (Lt SCV temp).   Bladder scan only on 3/17.  # Cardiogenic Shock,  Impella removed; status post transcutaneous mitral valve repair; previously on ECMO. per cardiology.  # VT + PEA SCA, as per cardiology team. # PE on heparin per CCM # Anemia, Hb  dropping--  added ESA, supportive care # Hyperkalemia, less present .  Existing CRRT. # Severe MR s/p TEER 3/5   Subjective:  Extubated, awake and appropriate; fianc not bedside today.  No urine output.recorded and bladder scan only 110 cc on 3/17.  CRRT stopped 3/15.   Objective Vital signs in last 24 hours: Vitals:   07/29/23 0836 07/29/23 0955 07/29/23 0956 07/29/23 0958  BP:      Pulse:      Resp:      Temp: 98.6 F (37 C)     TempSrc: Oral     SpO2:  99% 98% 100%  Weight:      Height:       Weight change: 0 kg  Intake/Output Summary (Last 24 hours) at 07/29/2023 1138 Last data filed at 07/29/2023 0800 Gross per 24 hour  Intake 2383.57 ml  Output 740 ml  Net 1643.57 ml       Labs: RENAL PANEL Recent Labs  Lab 07/25/23 0208 07/25/23 0407 07/26/23 0437 07/26/23 0443 07/26/23 1610 07/27/23 0500  07/27/23 1503 07/27/23 1608 07/28/23 0512 07/29/23 0427  NA 130*   < > 134*  133*   < > 134* 137  133* 134* 135 133* 132*  K 3.8   < > 3.9  3.8   < > 4.1 4.4  4.3 4.4 4.5 4.6 4.6  CL 97*   < > 100  101  --  101 102  102 102  --  103 97*  CO2 20*   < > 21*  22  --  22 17*  20* 16*  --  18* 21*  GLUCOSE 112*   < > 150*  153*  --  154* 132*  136* 148*  --  170* 151*  BUN 54*   < > 47*  46*  --  55* 75*  75* 90*  --  116* 90*  CREATININE 3.39*   < > 3.39*  3.24*  --  4.35* 5.69*  5.80* 7.40*  --  8.93* 7.20*  CALCIUM 8.0*   < > 8.2*  8.0*  --  8.1* 8.0*  7.8* 7.9*  --  7.9* 7.7*  MG 2.3  --  2.2  --   --  2.3  --   --  2.6* 2.3  PHOS 4.1   < >  3.8  3.8  --  4.4 4.5  4.5 4.1  --  4.3 5.0*  ALBUMIN 2.0*   < > 2.2*  2.1*  --  2.2* 2.2*  2.1* 2.1*  --  2.1*  2.1* 2.2*   < > = values in this interval not displayed.    Liver Function Tests: Recent Labs  Lab 07/27/23 0500 07/27/23 1503 07/28/23 0512 07/29/23 0427  AST 46*  --  42* 396*  ALT 27  --  26 147*  ALKPHOS 118  --  115 500*  BILITOT 1.0  --  1.1 0.9  PROT 6.8  --  6.5 6.6  ALBUMIN 2.2*  2.1* 2.1* 2.1*  2.1* 2.2*   No results for input(s): "LIPASE", "AMYLASE" in the last 168 hours. Recent Labs  Lab 07/24/23 1155  AMMONIA 63*   CBC: Recent Labs    07/26/23 1609 07/27/23 0500 07/27/23 1608 07/28/23 0512 07/29/23 0427  HGB 8.2* 7.6* 11.9* 7.6* 8.3*  MCV  --  96.0  --  94.9 94.2    Cardiac Enzymes: No results for input(s): "CKTOTAL", "CKMB", "CKMBINDEX", "TROPONINI" in the last 168 hours. CBG: Recent Labs  Lab 07/28/23 1645 07/28/23 1935 07/28/23 2307 07/29/23 0348 07/29/23 0833  GLUCAP 174* 156* 154* 137* 142*    Iron Studies: No results for input(s): "IRON", "TIBC", "TRANSFERRIN", "FERRITIN" in the last 72 hours. Studies/Results: No results found.   Medications: Infusions:  anticoagulant sodium citrate     dexmedetomidine (PRECEDEX) IV infusion Stopped (07/28/23 1354)   feeding  supplement (VITAL 1.5 CAL) 75 mL/hr at 07/29/23 0800   heparin 1,900 Units/hr (07/29/23 0800)    Scheduled Medications:  acetaminophen  650 mg Oral Q6H   amiodarone  200 mg Oral BID   arformoterol  15 mcg Nebulization BID   ascorbic acid  250 mg Oral BID   aspirin  81 mg Oral Daily   budesonide (PULMICORT) nebulizer solution  0.25 mg Nebulization BID   Chlorhexidine Gluconate Cloth  6 each Topical Q0600   darbepoetin (ARANESP) injection - DIALYSIS  200 mcg Subcutaneous Q Fri-1800   feeding supplement (PROSource TF20)  60 mL Per Tube BID   fiber supplement (BANATROL TF)  60 mL Per Tube BID   folic acid  1 mg Oral Daily   insulin aspart  0-20 Units Subcutaneous Q4H   methimazole  10 mg Oral Daily   mexiletine  250 mg Oral Q12H   multivitamin  1 tablet Oral QHS   mouth rinse  15 mL Mouth Rinse 4 times per day   revefenacin  175 mcg Nebulization Daily   sodium chloride flush  3 mL Intravenous Q12H   sodium chloride HYPERTONIC  4 mL Nebulization BID   thiamine  100 mg Per Tube Daily   Or   thiamine  100 mg Intravenous Daily   zinc sulfate (50mg  elemental zinc)  220 mg Oral Daily    have reviewed scheduled and prn medications.  Physical Exam: General: Critically ill looking male, more alert SO at bedside  Heart:RRR, s1s2 nl Lungs: Intubated, sedated. Abdomen:soft, non-distended Extremities:No edema Dialysis Access: Left subclavian temporary HD line.-  newest line placed 3/9  Lanyia Jewel W 07/29/2023,11:38 AM  LOS: 20 days

## 2023-07-29 NOTE — Progress Notes (Signed)
 PHARMACY - ANTICOAGULATION CONSULT NOTE  Pharmacy Consult for Heparin  Indication: pulmonary embolus, s/p ECMO decannulation 3/7, Impella removal 3/12  No Known Allergies  Patient Measurements: Height: 6\' 4"  (193 cm) Weight: 74 kg (163 lb 2.3 oz) IBW/kg (Calculated) : 86.8 Heparin Dosing Weight: n/a  Vital Signs: Temp: 98.6 F (37 C) (03/18 0836) Temp Source: Oral (03/18 0836) BP: 134/81 (03/18 0800) Pulse Rate: 88 (03/18 0800)  Labs: Recent Labs    07/27/23 0500 07/27/23 1503 07/27/23 1608 07/27/23 2304 07/28/23 0512 07/28/23 0902 07/29/23 0427  HGB 7.6*  --  11.9*  --  7.6*  --  8.3*  HCT 24.2*  --  35.0*  --  24.2*  --  26.2*  PLT 212  --   --   --  307  --  357  HEPARINUNFRC 1.02* 0.15*  --    < > 0.35 0.35 0.37  CREATININE 5.69*  5.80* 7.40*  --   --  8.93*  --  7.20*   < > = values in this interval not displayed.    Estimated Creatinine Clearance: 14.6 mL/min (A) (by C-G formula based on SCr of 7.2 mg/dL (H)).   Medical History: Past Medical History:  Diagnosis Date   Atrial fibrillation [facility])    Hyperthyroidism    Mitral regurgitation    NSTEMI (non-ST elevated myocardial infarction) [facility])     Assessment: 38 yo M with bilateral PE s/p TNK (2/27 @1156 ) now on [place] ECMO + Impella. Pharmacy consulted for bivalirudin for anticoagulation.   S/p impella CP >5.5 3/3 - bivalirudin was previously held with oozing at insertion site but restart 3/4. Patient underwent mitraclip 3/5.  Bival transitioned to heparin 3/7 after decannulation.  Impella 5.5 removed 3/13. Heparin restarted 3/13. Hep in CRRT stopped 3/15 with CRRT stopping.   Heparin level came back therapeutic at 0.37, on 1900 units/hr. Hgb 8.3, plt 357. No s/sx of bleeding or infusion issues.    Goal of Therapy:  Heparin level 0.3-0.7 units/mL Monitor platelets by anticoagulation protocol: Yes   Plan:  Continue heparin infusion at 1900 units/hr Heparin level and CBC daily  Monitor s/sx of bleeding    Thank you for allowing pharmacy to participate in this patient's care,  Sherron Monday, PharmD, BCCCP Clinical Pharmacist  Phone: [phone] 07/29/2023 10:29 AM  Please check AMION for all [facility] Pharmacy phone numbers After 10:00 PM, call Main Pharmacy [phone]

## 2023-07-29 NOTE — Progress Notes (Addendum)
 [facility] PROGRESS NOTE    Progress Note  Aaron [name]  GHW:[id number] DOB: 05/31/1985 DOA: 07/09/2023 PCP: Patient, No Pcp Per     Brief Narrative:   Aaron [name] is an 38 y.o. male past medical history of hypothyroidism paroxysmal atrial fibrillation on anticoagulation (noncompliant with his Eliquis) with severe mitral regurgitation came into was Hospital on 07/09/2023 was along with shortness of breath cough nausea and diarrhea was found to have a PE and A-fib with RVR started on IV heparin, suspect thyrotoxicosis placed on beta-blocker methimazole and steroids, he rapidly deteriorated on 07/09/2022 went into V. tach V-fib arrest and intubated after 40 minutes of CPR, went into ATN requiring CRRT now on HD cardiology was consulted was placed on VA ECMO and was transferred to [facility]  Significant Events: 2/26 admitted +PE 2/27 vtac/ vfib arrest> rosc after 40 mins, s/p TNK,  VA ecmo 3/5 salvage TEER (transcatheter edge to edge mitral valve repair) 3/7 weaned off VA 3/8 bronch for mucus plugging, VA ecmo decannulation >impella 5.5 3/12 impella removed, extubation trial 2/28 CRRT 3/13 reintubated, bronch for recurrent plugging, HD cath rewired due to malfunction 3/15 CRRT stopped, remains anuric 3/16 awake f/c, extubated   Assessment/Plan:   Cardiogenic shock: Suspect respiratory failure was mainly due to PE in the setting of mitral regurgitation and possibly thyrotoxicosis.  Suspected downtime about 20 minutes. May return on 07/15/2023 at bedside. ECMO is being managed by advanced heart failure team. He developed frequent PVCs and SVT requiring amiodarone bolus. Underwent successful mTEER on 07/16/23 with placement of x3 clips and improvement in MR from severe to mild.  Decannulated from ECMO on 07/19/2023 07/21/2023 EF was 35% status post mitral valve clip with anterior and septal hypokinesia. Impella was removed with 07/23/2023 CVVH stopped on 07/26/2023. Per advance  heart failure team.  Severe mitral regurgitation: Now status post mTEER on 08/04/2023. Severe mitral regurg is improved.  Acute respiratory failure with hypoxia pulmonary hypertension/acute PE: Is now off nitrous oxide, Swan-Ganz is discontinued. PE without right heart strain, now on IV heparin. Has been slowly been weaned to room air  VT/NSVT: Suspect secondary to Impella now out he required several doses of amiodarone bolus. He is now on mexiletine and amiodarone per tube Remains in sinus rhythm.  ID: Now off Vanco meropenem and micafungin. Continues to remain afebrile, white count is trending down. Suspect primarily pulmonary likely due infection, blood culture data has shown no growth.  Normocytic anemia: Repeat hemoglobin greater than 7. Today is 8.3.  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 On CRRT since 07/11/2023. A subclavian catheter was placed by CCM. Last HD on 07/28/2023. Further management per renal.  Acute small right CVA: MRI of the head on 07/24/2022 shows marked fluid to the right cerebellar and cerebral hemisphere. SPECT embolic, continue IV heparin. Following commands. For swallowing evaluation today.  Nutrition/moderate protein caloric malnutrition: Continue tube feedings. Nutrition is on board.  Deconditioning: PT OT to continue to work with them. Try to set him as much as we can. Hopefully PT OT can start getting him to the chair soon Will need some kind of rehab possibly inpatient rehab.   Stage II sacral cubitus ulcer:  RN Pressure Injury Documentation: Pressure Injury 07/23/23 Sacrum Mid Stage 2 -  Partial thickness loss of dermis presenting as a shallow open injury with a red, pink wound bed without slough. light pink (Active)  07/23/23 1200  Location: Sacrum  Location Orientation: Mid  Staging: Stage 2 -  Partial thickness loss of dermis presenting as a shallow open injury with a red, pink wound  bed without slough.  Wound Description (Comments): light pink  Present on Admission: No  Dressing Type Foam - Lift dressing to assess site every shift 07/28/23 1700  DVT prophylaxis: lovenoxn Family Communication: Girlfriend Status is: Inpatient Remains inpatient appropriate because: Shock    Code Status:     Code Status Orders  (From admission, onward)           Start     Ordered   07/10/23 1658  Full code  Continuous       Comments: Full code with ACLS Protocol WITH NO CHEST COMPRESSIONS  Question:  By:  Answer:  Consent: discussion documented in EHR   07/10/23 1657           Code Status History     Date Active Date Inactive Code Status Order ID Comments User Context   07/10/2023 0039 07/10/2023 1657 Full Code [id number]  Charlsie Quest, MD ED   03/20/2023 0926 03/22/2023 1621 Full Code [id number]  Maryln Gottron, MD ED   03/30/2022 0656 04/01/2022 2126 Full Code [id number]  Angie Fava, DO ED   10/19/2021 2359 10/23/2021 1924 Full Code [id number]  Tannenbaum, [place], MD Inpatient   08/16/2016 0633 08/16/2016 1236 Full Code [id number]  Devoria Albe, MD ED         IV Access:   Peripheral IV   Procedures and diagnostic studies:   No results found.   Medical Consultants:   None.   Subjective:    Aaron [name] in a good mood this morning, relates he is ready to get the tube out  Objective:    Vitals:   07/29/23 0300 07/29/23 0400 07/29/23 0401 07/29/23 0500  BP: 134/83 131/79  129/77  Pulse: 94 74  85  Resp: 11 (!) 35  (!) 36  Temp:   98.6 F (37 C)   TempSrc:   Axillary   SpO2: 100% 98%  99%  Weight:      Height:       SpO2: 99 % O2 Flow Rate (L/min): (S) 0 L/min FiO2 (%): 40 %   Intake/Output Summary (Last 24 hours) at 07/29/2023 0634 Last data filed at 07/29/2023 0500 Gross per 24 hour  Intake 2664.65 ml  Output 440 ml  Net 2224.65 ml   Filed Weights   07/27/23 0500 07/28/23 0328 07/28/23 0830  Weight: 73.8 kg 74 kg 74 kg     Exam: General exam: In no acute distress. Respiratory system: Good air movement and clear to auscultation. Cardiovascular system: S1 & S2 heard, RRR.  Gastrointestinal system: Abdomen is nondistended, soft and nontender.  Central nervous system: Alert and oriented x 3 left-sided weakness Extremities: No pedal edema. Skin: No rashes, lesions or ulcers Psychiatry: Judgment and insight appear intact   Data Reviewed:    Labs: Basic Metabolic Panel: Recent Labs  Lab 07/25/23 0208 07/25/23 0407 07/26/23 0437 07/26/23 0443 07/26/23 1610 07/27/23 0500 07/27/23 1503 07/27/23 1608 07/28/23 0512 07/29/23 0427  NA 130*   < > 134*  133*   < > 134* 137  133* 134* 135 133* 132*  K 3.8   < > 3.9  3.8   < > 4.1 4.4  4.3 4.4 4.5 4.6 4.6  CL 97*   < > 100  101  --  101 102  102 102  --  103 97*  CO2 20*   < >  21*  22  --  22 17*  20* 16*  --  18* 21*  GLUCOSE 112*   < > 150*  153*  --  154* 132*  136* 148*  --  170* 151*  BUN 54*   < > 47*  46*  --  55* 75*  75* 90*  --  116* 90*  CREATININE 3.39*   < > 3.39*  3.24*  --  4.35* 5.69*  5.80* 7.40*  --  8.93* 7.20*  CALCIUM 8.0*   < > 8.2*  8.0*  --  8.1* 8.0*  7.8* 7.9*  --  7.9* 7.7*  MG 2.3  --  2.2  --   --  2.3  --   --  2.6* 2.3  PHOS 4.1   < > 3.8  3.8  --  4.4 4.5  4.5 4.1  --  4.3 5.0*   < > = values in this interval not displayed.   GFR Estimated Creatinine Clearance: 14.6 mL/min (A) (by C-G formula based on SCr of 7.2 mg/dL (H)). Liver Function Tests: Recent Labs  Lab 07/25/23 0208 07/25/23 0407 07/26/23 0437 07/26/23 1610 07/27/23 0500 07/27/23 1503 07/28/23 0512 07/29/23 0427  AST 49*  --  49*  --  46*  --  42* 396*  ALT 23  --  25  --  27  --  26 147*  ALKPHOS 118  --  128*  --  118  --  115 500*  BILITOT 1.3*  --  1.0  --  1.0  --  1.1 0.9  PROT 6.7  --  7.3  --  6.8  --  6.5 6.6  ALBUMIN 2.0*   < > 2.2*  2.1* 2.2* 2.2*  2.1* 2.1* 2.1*  2.1* 2.2*   < > = values in this interval not  displayed.   No results for input(s): "LIPASE", "AMYLASE" in the last 168 hours. Recent Labs  Lab 07/24/23 1155  AMMONIA 63*   Coagulation profile No results for input(s): "INR", "PROTIME" in the last 168 hours. COVID-19 Labs  Recent Labs    07/27/23 0500 07/28/23 0512  LDH 447* 427*    Lab Results  Component Value Date   SARSCOV2NAA NEGATIVE 07/09/2023   SARSCOV2NAA NEGATIVE 02/05/2022    CBC: Recent Labs  Lab 07/25/23 0407 07/25/23 0413 07/26/23 0437 07/26/23 0443 07/26/23 1609 07/27/23 0500 07/27/23 1608 07/28/23 0512 07/29/23 0427  WBC 30.7*  --  16.7*  --   --  20.9*  --  18.4* 17.6*  HGB 7.4*   < > 7.5*   < > 8.2* 7.6* 11.9* 7.6* 8.3*  HCT 23.3*   < > 23.8*   < > 24.0* 24.2* 35.0* 24.2* 26.2*  MCV 92.8  --  93.7  --   --  96.0  --  94.9 94.2  PLT 234  --  185  --   --  212  --  307 357   < > = values in this interval not displayed.   Cardiac Enzymes: No results for input(s): "CKTOTAL", "CKMB", "CKMBINDEX", "TROPONINI" in the last 168 hours. BNP (last 3 results) No results for input(s): "PROBNP" in the last 8760 hours. CBG: Recent Labs  Lab 07/28/23 1139 07/28/23 1645 07/28/23 1935 07/28/23 2307 07/29/23 0348  GLUCAP 113* 174* 156* 154* 137*   D-Dimer: No results for input(s): "DDIMER" in the last 72 hours. Hgb A1c: No results for input(s): "HGBA1C" in the last 72 hours. Lipid Profile: Recent Labs  07/27/23 0500  TRIG 115   Thyroid function studies: No results for input(s): "TSH", "T4TOTAL", "T3FREE", "THYROIDAB" in the last 72 hours.  Invalid input(s): "FREET3" Anemia work up: No results for input(s): "VITAMINB12", "FOLATE", "FERRITIN", "TIBC", "IRON", "RETICCTPCT" in the last 72 hours. Sepsis Labs: Recent Labs  Lab 07/24/23 1155 07/24/23 2010 07/25/23 0407 07/25/23 0935 07/26/23 0437 07/27/23 0500 07/28/23 0512 07/29/23 0427  PROCALCITON 6.84  --   --  6.36  --   --   --   --   WBC  --  35.9*   < >  --  16.7* 20.9* 18.4*  17.6*  LATICACIDVEN  --  0.8  --   --   --   --   --   --    < > = values in this interval not displayed.   Microbiology Recent Results (from the past 240 hours)  Culture, Respiratory w Gram Stain     Status: None   Collection Time: 07/19/23  7:42 AM   Specimen: Tracheal Aspirate; Respiratory  Result Value Ref Range Status   Specimen Description TRACHEAL ASPIRATE  Final   Special Requests NONE  Final   Gram Stain   Final    RARE WBC SEEN FEW SQUAMOUS EPITHELIAL CELLS PRESENT RARE GRAM NEGATIVE RODS    Culture   Final    NO GROWTH 2 DAYS Performed at [facility], [address], [place], [place] [address]    Report Status 07/21/2023 FINAL  Final  Culture, Respiratory w Gram Stain     Status: None   Collection Time: 07/19/23 11:40 AM   Specimen: Bronchoalveolar Lavage; Respiratory  Result Value Ref Range Status   Specimen Description BRONCHIAL ALVEOLAR LAVAGE  Final   Special Requests NONE  Final   Gram Stain   Final    FEW WBC PRESENT, PREDOMINANTLY PMN RARE SQUAMOUS EPITHELIAL CELLS PRESENT RARE GRAM NEGATIVE RODS    Culture   Final    NO GROWTH 2 DAYS Performed at [facility], [address], [place], [place] [address]    Report Status 07/21/2023 FINAL  Final  Surgical PCR screen     Status: None   Collection Time: 07/23/23 12:34 AM   Specimen: Nasal Mucosa; Nasal Swab  Result Value Ref Range Status   MRSA, PCR NEGATIVE NEGATIVE Final   Staphylococcus aureus NEGATIVE NEGATIVE Final    Comment: (NOTE) The Xpert SA Assay (FDA approved for NASAL specimens in patients 92 years of age and older), is one component of a comprehensive surveillance program. It is not intended to diagnose infection nor to guide or monitor treatment. Performed at [facility], [address] [place], [place] [address]   Culture, Respiratory w Gram Stain     Status: None   Collection Time: 07/24/23  5:17 AM   Specimen: Tracheal Aspirate; Respiratory  Result Value Ref  Range Status   Specimen Description TRACHEAL ASPIRATE  Final   Special Requests NONE  Final   Gram Stain NO WBC SEEN NO ORGANISMS SEEN   Final   Culture   Final    FEW Normal respiratory flora-no Staph aureus or Pseudomonas seen Performed at [facility], [address], [place], [place] [address]    Report Status 07/26/2023 FINAL  Final     Medications:    acetaminophen  650 mg Oral Q6H   amiodarone  200 mg Oral BID   arformoterol  15 mcg Nebulization BID   ascorbic acid  250 mg Oral BID   aspirin  81 mg Oral Daily   budesonide (PULMICORT) nebulizer solution  0.25 mg Nebulization BID   Chlorhexidine Gluconate Cloth  6 each Topical Q0600   darbepoetin (ARANESP) injection - DIALYSIS  200 mcg Subcutaneous Q Fri-1800   feeding supplement (PROSource TF20)  60 mL Per Tube BID   fiber supplement (BANATROL TF)  60 mL Per Tube BID   folic acid  1 mg Oral Daily   insulin aspart  0-20 Units Subcutaneous Q4H   methimazole  10 mg Oral Daily   mexiletine  250 mg Oral Q12H   multivitamin  1 tablet Oral QHS   mouth rinse  15 mL Mouth Rinse 4 times per day   revefenacin  175 mcg Nebulization Daily   sodium chloride flush  10-40 mL Intracatheter Q12H   sodium chloride flush  3 mL Intravenous Q12H   sodium chloride HYPERTONIC  4 mL Nebulization BID   thiamine  100 mg Per Tube Daily   Or   thiamine  100 mg Intravenous Daily   zinc sulfate (50mg  elemental zinc)  220 mg Oral Daily   Continuous Infusions:  anticoagulant sodium citrate     dexmedetomidine (PRECEDEX) IV infusion Stopped (07/28/23 1354)   feeding supplement (VITAL 1.5 CAL) 75 mL/hr at 07/29/23 0500   heparin 1,900 Units/hr (07/29/23 0500)      LOS: 20 days   Marinda Elk  [facility]  07/29/2023, 6:34 AM

## 2023-07-29 NOTE — Progress Notes (Signed)
 Modified Barium Swallow Study  Patient Details  Name: Aaron [name] MRN: [id number] Date of Birth: 09/21/1985  Today's Date: 07/29/2023  Modified Barium Swallow completed.  Full report located under Chart Review in the Imaging Section.  History of Present Illness Aaron [name] is a 38 yo male presenting to ED 2/26 with chest pain and dyspnea. Admitted with bilateral lower lobe pulmonary emboli. VT/VF cardiac arrest 2/27, requiring 40 minutes of CPR and multiple shocks. Impella placed 2/27-3/12. ECMO initiated 2/27-3/8. CRRT stopped 3/15. ETT 2/27-3/12, reintubated 3/12-3/16 s/p mucus plugging and L lung opacification. 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 PMH includes hypothyroidism, paroxysmal A-fib, severe mitral regurgitation   Clinical Impression Pt exhibits severe oropharyngeal dysphagia with concern for glottal insufficiency after prolonged intubation. He remains aphonic with a huff-like cough that is ineffective at clearing his airway. Thin and nectar thick liquids initially reached the level of the vocal folds before progressing further and being silently aspirated (PAS 8). A L and R head turn resulted in gross silent aspiration. Pt denies the urge to cough and his cued cough lacks crispness and strength. Recommend Dys 3 solids with honey thick liquids via cup or spoon and full supervision. Given difference in presentation when pt performed head turns in addition to aphonia, recommend ENT consult to assess vocal fold function. SLP will continue following.  DIGEST Swallow Severity Rating*  Safety: 4  Efficiency: 1  Overall Pharyngeal Swallow Severity: 3 (severe) 1: mild; 2: moderate; 3: severe; 4: profound  *The Dynamic Imaging Grade of Swallowing Toxicity is standardized for the head and neck cancer population, however, demonstrates promising clinical applications across populations to  standardize the clinical rating of pharyngeal swallow safety and severity.  Factors that may increase risk of adverse event in presence of aspiration [name]: Poor general health and/or compromised immunity;Frail or deconditioned;Presence of tubes (ETT, trach, NG, etc.)  Swallow Evaluation Recommendations Recommendations: PO diet PO Diet Recommendation: Dysphagia 3 (Mechanical soft);Moderately thick liquids (Level 3, honey thick) Liquid Administration via: Spoon;Cup Medication Administration: Crushed with puree Supervision: Staff to assist with self-feeding;Full supervision/cueing for swallowing strategies Swallowing strategies  : Minimize environmental distractions;Slow rate;Small bites/sips Postural changes: Position pt fully upright for meals;Stay upright 30-60 min after meals Oral care recommendations: Oral care QID (4x/day);Oral care before PO;Use suctioning for oral care Recommended consults: Consider ENT consultation Caregiver Recommendations: Avoid jello, ice cream, thin soups, popsicles;Remove water pitcher    Gwynneth Aliment, M.A., CF-SLP Speech Language Pathology, Acute Rehabilitation Services  Secure Chat preferred [phone]  07/29/2023,1:40 PM

## 2023-07-29 NOTE — Progress Notes (Signed)
 Inpatient Rehab Admissions Coordinator:   Rescreened for progress.  Started iHD.  Progressing slowly, and only able to tolerate EOB therapy but not able to mobilize out of bed at this time.  Will rescreen again in 1-2 more days for progress, but TOC should consider other rehab venues.    Estill Dooms, PT, DPT Admissions Coordinator [phone] 07/29/23  3:16 PM

## 2023-07-30 DIAGNOSIS — R079 Chest pain, unspecified: Secondary | ICD-10-CM | POA: Diagnosis not present

## 2023-07-30 DIAGNOSIS — I2699 Other pulmonary embolism without acute cor pulmonale: Secondary | ICD-10-CM | POA: Diagnosis not present

## 2023-07-30 DIAGNOSIS — E875 Hyperkalemia: Secondary | ICD-10-CM | POA: Diagnosis not present

## 2023-07-30 DIAGNOSIS — R570 Cardiogenic shock: Secondary | ICD-10-CM | POA: Diagnosis not present

## 2023-07-30 DIAGNOSIS — J9601 Acute respiratory failure with hypoxia: Secondary | ICD-10-CM | POA: Diagnosis not present

## 2023-07-30 DIAGNOSIS — N170 Acute kidney failure with tubular necrosis: Secondary | ICD-10-CM | POA: Diagnosis not present

## 2023-07-30 DIAGNOSIS — R0602 Shortness of breath: Secondary | ICD-10-CM | POA: Diagnosis not present

## 2023-07-30 DIAGNOSIS — I469 Cardiac arrest, cause unspecified: Secondary | ICD-10-CM | POA: Diagnosis not present

## 2023-07-30 DIAGNOSIS — E872 Acidosis, unspecified: Secondary | ICD-10-CM | POA: Diagnosis not present

## 2023-07-30 LAB — GLUCOSE, CAPILLARY
Glucose-Capillary: 173 mg/dL — ABNORMAL HIGH (ref 70–99)
Glucose-Capillary: 176 mg/dL — ABNORMAL HIGH (ref 70–99)
Glucose-Capillary: 134 mg/dL — ABNORMAL HIGH (ref 70–99)
Glucose-Capillary: 182 mg/dL — ABNORMAL HIGH (ref 70–99)
Glucose-Capillary: 186 mg/dL — ABNORMAL HIGH (ref 70–99)
Glucose-Capillary: 121 mg/dL — ABNORMAL HIGH (ref 70–99)

## 2023-07-30 LAB — HEPATIC FUNCTION PANEL
ALT: 72 U/L — ABNORMAL HIGH (ref 0–44)
AST: 60 U/L — ABNORMAL HIGH (ref 15–41)
Albumin: 2.1 g/dL — ABNORMAL LOW (ref 3.5–5.0)
Alkaline Phosphatase: 282 U/L — ABNORMAL HIGH (ref 38–126)
Bilirubin, Direct: 0.2 mg/dL (ref 0.0–0.2)
Indirect Bilirubin: 0.7 mg/dL (ref 0.3–0.9)
Total Bilirubin: 0.9 mg/dL (ref 0.0–1.2)
Total Protein: 6.1 g/dL — ABNORMAL LOW (ref 6.5–8.1)

## 2023-07-30 LAB — RENAL FUNCTION PANEL
Albumin: 2.1 g/dL — ABNORMAL LOW (ref 3.5–5.0)
Anion gap: 17 — ABNORMAL HIGH (ref 5–15)
BUN: 130 mg/dL — ABNORMAL HIGH (ref 6–20)
CO2: 19 mmol/L — ABNORMAL LOW (ref 22–32)
Calcium: 7.9 mg/dL — ABNORMAL LOW (ref 8.9–10.3)
Chloride: 95 mmol/L — ABNORMAL LOW (ref 98–111)
Creatinine, Ser: 9.41 mg/dL — ABNORMAL HIGH (ref 0.61–1.24)
GFR, Estimated: 7 mL/min — ABNORMAL LOW (ref >60)
Glucose, Bld: 170 mg/dL — ABNORMAL HIGH (ref 70–99)
Phosphorus: 5.3 mg/dL — ABNORMAL HIGH (ref 2.5–4.6)
Potassium: 4.6 mmol/L (ref 3.5–5.1)
Sodium: 131 mmol/L — ABNORMAL LOW (ref 135–145)

## 2023-07-30 LAB — CBC
HCT: 27.1 % — ABNORMAL LOW (ref 39.0–52.0)
Hemoglobin: 8.6 g/dL — ABNORMAL LOW (ref 13.0–17.0)
MCH: 30.1 pg (ref 26.0–34.0)
MCHC: 31.7 g/dL (ref 30.0–36.0)
MCV: 94.8 fL (ref 80.0–100.0)
Platelets: 433 10*3/uL — ABNORMAL HIGH (ref 150–400)
RBC: 2.86 MIL/uL — ABNORMAL LOW (ref 4.22–5.81)
RDW: 19.9 % — ABNORMAL HIGH (ref 11.5–15.5)
WBC: 14.1 10*3/uL — ABNORMAL HIGH (ref 4.0–10.5)
nRBC: 0.4 % — ABNORMAL HIGH (ref 0.0–0.2)

## 2023-07-30 LAB — HEPARIN LEVEL (UNFRACTIONATED): Heparin Unfractionated: 0.47 [IU]/mL (ref 0.30–0.70)

## 2023-07-30 MED ORDER — GERHARDT'S BUTT CREAM: TOPICAL_CREAM | Freq: Every day | CUTANEOUS | Status: DC | PRN

## 2023-07-30 MED ORDER — BANATROL TF EN LIQD
60.0000 mL | Freq: Four times a day (QID) | ENTERAL | Status: DC
  Administered 2023-07-30 – 2023-07-31 (×3): 60 mL
  Filled 2023-07-30 (×3): qty 60

## 2023-07-30 NOTE — Evaluation (Signed)
 Speech Language Pathology Evaluation Patient Details Name: Aaron [name] MRN: [id number] DOB: 03-05-1986 Today's Date: 07/30/2023 Time: 4098-1191 SLP Time Calculation (min) (ACUTE ONLY): 17 min  Problem List:  Patient Active Problem List   Diagnosis Date Noted   Protein-calorie malnutrition, severe 07/23/2023   Palliative care by specialist 07/21/2023   Cardiac arrest (HCC) 07/10/2023   Cardiogenic shock (HCC) 07/10/2023   Hyperkalemia 07/10/2023   On mechanically assisted ventilation (HCC) 07/10/2023   Acute respiratory failure with hypoxia (HCC) 07/10/2023   Bilateral pulmonary embolism (HCC) 07/09/2023   Chronic heart failure with preserved ejection fraction (HFpEF) (HCC) 07/09/2023   Severe mitral regurgitation 07/09/2023   Paroxysmal atrial fibrillation (HCC) 07/09/2023   Atrial fibrillation with rapid ventricular response (HCC) 03/20/2023   Chest pain 03/30/2022   Essential hypertension 03/30/2022   Hypomagnesemia 03/30/2022   Hyperthyroidism 10/23/2021   Elevated troponin 10/19/2021   NSTEMI (non-ST elevated myocardial infarction) (HCC) 10/19/2021   Past Medical History:  Past Medical History:  Diagnosis Date   Atrial fibrillation (HCC)    Hyperthyroidism    Mitral regurgitation    NSTEMI (non-ST elevated myocardial infarction) [facility])    Past Surgical History:  Past Surgical History:  Procedure Laterality Date   ECMO CANNULATION N/A 07/10/2023   Procedure: ECMO CANNULATION;  Surgeon: Dolores Patty, MD;  Location: [facility] INVASIVE CV LAB;  Service: Cardiovascular;  Laterality: N/A;   EMBOLECTOMY Left 07/18/2023   Procedure: EMBOLECTOMY Left Femoral, Popliteal and iliac arterys,  Repair Left common femoral artery.;  Surgeon: Victorino Sparrow, MD;  Location: [facility] OR;  Service: Vascular;  Laterality: Left;   INTRAOPERATIVE TRANSESOPHAGEAL ECHOCARDIOGRAM N/A 07/23/2023   Procedure: ECHOCARDIOGRAM, TRANSESOPHAGEAL, INTRAOPERATIVE;  Surgeon: Loreli Slot, MD;  Location:  [facility] OR;  Service: Open Heart Surgery;  Laterality: N/A;   LEFT HEART CATH AND CORONARY ANGIOGRAPHY N/A 10/22/2021   Procedure: LEFT HEART CATH AND CORONARY ANGIOGRAPHY;  Surgeon: Yvonne Kendall, MD;  Location: [facility] INVASIVE CV LAB;  Service: Cardiovascular;  Laterality: N/A;   PLACEMENT OF IMPELLA LEFT VENTRICULAR ASSIST DEVICE Right 07/14/2023   Procedure: PLACEMENT OF RIGHT AXILLARY IMPELLA 5.5 LEFT VENTRICULAR ASSIST DEVICE;  Surgeon: Loreli Slot, MD;  Location: [facility] OR;  Service: Open Heart Surgery;  Laterality: Right;   REMOVAL OF IMPELLA LEFT VENTRICULAR ASSIST DEVICE Right 07/14/2023   Procedure: REMOVAL OF CP RIGHT FEMORAL IMPELLA LEFT VENTRICULAR ASSIST DEVICE;  Surgeon: Loreli Slot, MD;  Location: [facility] OR;  Service: Open Heart Surgery;  Laterality: Right;   REMOVAL OF IMPELLA LEFT VENTRICULAR ASSIST DEVICE N/A 07/23/2023   Procedure: REMOVAL, CARDIAC ASSIST DEVICE, IMPELLA;  Surgeon: Loreli Slot, MD;  Location: [facility] OR;  Service: Open Heart Surgery;  Laterality: N/A;   TRANSCATHETER MITRAL EDGE TO EDGE REPAIR N/A 07/16/2023   Procedure: TRANSCATHETER MITRAL EDGE TO EDGE REPAIR;  Surgeon: Tonny Bollman, MD;  Location: [facility] INVASIVE CV LAB;  Service: Cardiovascular;  Laterality: N/A;   TRANSESOPHAGEAL ECHOCARDIOGRAM (CATH LAB) N/A 07/16/2023   Procedure: TRANSESOPHAGEAL ECHOCARDIOGRAM;  Surgeon: Tonny Bollman, MD;  Location: [facility] INVASIVE CV LAB;  Service: Cardiovascular;  Laterality: N/A;   VENTRICULAR ASSIST DEVICE INSERTION N/A 07/10/2023   Procedure: VENTRICULAR ASSIST DEVICE INSERTION;  Surgeon: Dolores Patty, MD;  Location: [facility] INVASIVE CV LAB;  Service: Cardiovascular;  Laterality: N/A;   HPI:  Aaron [name] is a 38 yo male presenting to ED 2/26 with chest pain and dyspnea. Admitted with bilateral lower lobe pulmonary emboli. VT/VF cardiac arrest 2/27, requiring 40 minutes of CPR  and multiple shocks. Impella placed 2/27-3/12. ECMO initiated 2/27-3/8. CRRT stopped  3/15. ETT 2/27-3/12, reintubated 3/12-3/16 s/p mucus plugging and L lung opacification. 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 PMH includes hypothyroidism, paroxysmal A-fib, severe mitral regurgitation   Assessment / Plan / Recommendation Clinical Impression  Pt presents with acute changes to cognitive function characterized by increased difficulty with memory, problem solving, awareness, and initiation. He endorses feeling different from baseline with new deficits related to memory and attention, but then stated that he needed to get back to work tomorrow. He is oriented x4 and demonstrates seemingly intact sustained attention. Pt was able to recall 2/4 novel words after a delay. He needs cueing at times to initiate responses in spontaneous conversation. His voice is low in quality, but is improved since initial swallowing evaluation 3/17. Given pt's independence at baseline, feel he would benefit from intensive SLP f/u to target cognitive-lingusitic goals, >3 hrs/day. Will continue following acutely.    SLP Assessment  SLP Recommendation/Assessment: Patient needs continued Speech Lanaguage Pathology Services SLP Visit Diagnosis: Aphonia (R49.1);Cognitive communication deficit (R41.841)    Recommendations for follow up therapy are one component of a multi-disciplinary discharge planning process, led by the attending physician.  Recommendations may be updated based on patient status, additional functional criteria and insurance authorization.    Follow Up Recommendations  Acute inpatient rehab (3hours/day)    Assistance Recommended at Discharge  Frequent or constant Supervision/Assistance  Functional Status Assessment Patient has had a recent decline in their functional status and demonstrates the ability to make significant improvements in function in a reasonable and predictable amount of time.   Frequency and Duration min 2x/week  2 weeks      SLP Evaluation Cognition  Overall Cognitive Status: Impaired/Different from baseline Arousal/Alertness: Awake/alert Orientation Level: Oriented X4 Attention: Sustained Sustained Attention: Appears intact Memory: Impaired Memory Impairment: Retrieval deficit;Decreased recall of new information Awareness: Impaired Awareness Impairment: Intellectual impairment Problem Solving: Impaired Problem Solving Impairment: Verbal basic       Comprehension  Auditory Comprehension Overall Auditory Comprehension: Appears within functional limits for tasks assessed    Expression Expression Primary Mode of Expression: Verbal Verbal Expression Overall Verbal Expression: Appears within functional limits for tasks assessed   Oral / Motor  Oral Motor/Sensory Function Overall Oral Motor/Sensory Function: Within functional limits Motor Speech Overall Motor Speech: Impaired Respiration: Within functional limits Phonation: Low vocal intensity Resonance: Within functional limits Articulation: Within functional limitis Intelligibility: Intelligible Motor Planning: Witnin functional limits            Gwynneth Aliment, M.A., CF-SLP Speech Language Pathology, Acute Rehabilitation Services  Secure Chat preferred [phone]  07/30/2023, 11:00 AM

## 2023-07-30 NOTE — Progress Notes (Signed)
 Nutrition Follow-up  DOCUMENTATION CODES:   Severe malnutrition in context of acute illness/injury  INTERVENTION:   Encouraged pt to try and eat at least small bites and sips throughout the day in effort to stimulate appetite, wake up GI tract. Pt reports the only thing he wants currently is applesauce and ice chips. Recommend continuing TF at goal rate at this time  Magic cup BID with meals, each supplement provides 290 kcal and 9 grams of protein  Consider trial of Ensure Enlive-thickened to honey consistency as a strawberry protein shake did sound appealing to pt  Assistance with feeding at meal times due to significant UE weakness  Continue Renal MVI daily  Tube Feeding via Cortrak:  Continue Vital 1.5 at 75 ml/hr Continue Pro-Source TF20 60 mL to QID TF provides 3020 kcals, 202 g of protein and 1368 mL of free water    Increase Banatrol to QID BID   Continue Renal MVI Folic Acid and Thiamine   Add Vitamin C 250 mg BID daily x 30 days and Zinc Sulfate 220 mg daily x 14 days given increased risk for deficiency and increased needs for wound healing   NUTRITION DIAGNOSIS:   Severe Malnutrition related to acute illness as evidenced by moderate fat depletion, moderate muscle depletion.  Being addressed via TF, oral diet  GOAL:   Patient will meet greater than or equal to 90% of their needs  Addressed via TF  MONITOR:   TF tolerance, Vent status, Labs, Weight trends, Skin  REASON FOR ASSESSMENT:   Consult Enteral/tube feeding initiation and management (Trickle feeds)  ASSESSMENT:   38 y.o male presented with left-sided chest pain and shortness of breath, diagnosed with PE, coded and was intubated after 40 minutes of CPR. Subsequently ECMO started. PMH of PAF, chronic HFpEF, severe mitral regurgitation, hyperthyroidism. Noncompliance with medicines.  2/26 Admitted 2/27 V.tach/V.fib cardiac arrest, intubated, 40 minutes of CPR, tx to [facility], placed on [place] ECM[facility] 2/28  CRRT initiated via ECMO circuit, Citrate Protocol 3/01 Vital High Protein started at 20 ml/hr by MD 3/02 MD started titration of TF 3/03 OR: Exchanged Impella CP for impella 5.5, Cortrak  3/05 OR: transcatheter edge to edge mitral valve repair 3/07 OR: Decannulation from [facility] ECMO 3/10 ECHO with EF 35-40% 3/12 OR: Impella Removed, Extubated 3/13 Re-Intubated early AM on night shift, Bronch, MRI brain with small strokes (likely embolic) 3/14 Cortrak placed 1/19 CRRT discontinued 3/16 Extubated 3/17 1st iHD with no UF due to high stool output; diet advanced to Dysphagia 3, honey thick  Pt to receive iHD again today  Pt appears very flat on visit today, answers questions but not very talkative, slow to respond at times. Pt did seem to perk up when talking to RD about Basketball.   Pt reports he does not have an appetite but also indicates the food is "gross." Nothing sounds good to him, even when asked if family could bring in food from outside. Pt only wants applesauce and ice chips. Pt currently on Dysphagia 3, Honey Thick diet  Pt very weak, decompensated. Pt working with PT/OT. Noted pt did not move BUE on visit today. Even noted pt to "scratch an itch" on his head by rubbing his head back and forth on the pillow. Encourage pt to feed self as best he can but recommend feeding assistance at meal times to ensure inability to feed self does not negatively impact po intake.   Vital 1.5 at 75 ml/hr via Cortrak with Pro-Source BID  FMS removed yesterday, noted 1 stool post removal overnight, 1 stool thus far today  Weight down significantly since admission but has been stable recently  Noted 1 urine occurrence documented today but no volume. Noted bladder can with only 110 mL on 3/17  Labs: BUN 130, Creatinine 9.41, sodium 131 (L), potassium 4.6 (wdl), phosphorus 5.3 (H) Meds: Methiamazole, imodium prn, lactulose prn, banatrol BID, rena-vite daily, Vit C, Zinc, Thiamine   Diet Order:    Diet Order             DIET DYS 3 Room service appropriate? Yes with Assist; Fluid consistency: Honey Thick  Diet effective now                   EDUCATION NEEDS:   Not appropriate for education at this time  Skin:  Skin Assessment: Skin Integrity Issues: Skin Integrity Issues:: Stage II Stage II: sacrum Wound Vac: n/a Incisions: L axilla (closed), R groing-healing well per vascular surgery  Last BM:  3/19 type 6 yellowish stool  Height:   Ht Readings from Last 1 Encounters:  07/09/23 6\' 4"  (1.93 m)    Weight:   Wt Readings from Last 1 Encounters:  07/30/23 76.6 kg    Ideal Body Weight:  91.8 kg  BMI:  Body mass index is 20.56 kg/m.  Estimated Nutritional Needs:   Kcal:  2700-3000 kcals  Protein:  190-210 gm  Fluid:  >2L/day   Romelle Starcher MS, RDN, LDN, CNSC Registered Dietitian 3 Clinical Nutrition RD Inpatient Contact Info in [place] '

## 2023-07-30 NOTE — TOC Progression Note (Signed)
 Transition of Care [facility]) - Progression Note    Patient Details  Name: Aaron [name] MRN: [id number] Date of Birth: August 23, 1985  Transition of Care [facility]) CM/SW Contact  Elliot Cousin, RN Phone Number: [phone] 07/30/2023, 9:07 AM  Clinical Narrative:    TOC CM spoke to pt at bedside. States he lives with his SO and gave permission to speak to [name] or his sister, Shanda Bumps. States he wants to recover and would be interested in IP rehab.  Will continue to follow for dc needs.    Expected Discharge Plan: IP Rehab Facility Barriers to Discharge: Continued Medical Work up  Expected Discharge Plan and Services   Discharge Planning Services: CM Consult Post Acute Care Choice: IP Rehab Living arrangements for the past 2 months: Apartment                                       Social Determinants of Health (SDOH) Interventions SDOH Screenings   Food Insecurity: No Food Insecurity (07/10/2023)  Housing: Low Risk  (07/10/2023)  Transportation Needs: No Transportation Needs (07/10/2023)  Utilities: Not At Risk (07/10/2023)  Financial Resource Strain: Medium Risk (04/01/2023)  Tobacco Use: Low Risk  (07/10/2023)  Health Literacy: Adequate Health Literacy (04/01/2023)    Readmission Risk Interventions    07/10/2023   10:12 AM  Readmission Risk Prevention Plan  Transportation Screening Complete  PCP or Specialist Appt within 5-7 Days Complete  Home Care Screening Complete  Medication Review (RN CM) Complete

## 2023-07-30 NOTE — Progress Notes (Signed)
 PHARMACY - ANTICOAGULATION CONSULT NOTE  Pharmacy Consult for Heparin  Indication: pulmonary embolus, s/p ECMO decannulation 3/7, Impella removal 3/12  No Known Allergies  Patient Measurements: Height: 6\' 4"  (193 cm) Weight: 76.6 kg (168 lb 14 oz) IBW/kg (Calculated) : 86.8 Heparin Dosing Weight: n/a  Vital Signs: Temp: 98.2 F (36.8 C) (03/19 0800) Temp Source: Oral (03/19 0800) BP: 140/83 (03/19 0915) Pulse Rate: 82 (03/19 0915)  Labs: Recent Labs    07/27/23 1608 07/27/23 2304 07/28/23 0512 07/28/23 0902 07/29/23 0427 07/30/23 0500  HGB 11.9*  --  7.6*  --  8.3*  --   HCT 35.0*  --  24.2*  --  26.2*  --   PLT  --   --  307  --  357  --   HEPARINUNFRC  --    < > 0.35 0.35 0.37 0.47  CREATININE  --   --  8.93*  --  7.20* 9.41*   < > = values in this interval not displayed.    Estimated Creatinine Clearance: 11.5 mL/min (A) (by C-G formula based on SCr of 9.41 mg/dL (H)).   Medical History: Past Medical History:  Diagnosis Date   Atrial fibrillation [facility])    Hyperthyroidism    Mitral regurgitation    NSTEMI (non-ST elevated myocardial infarction) [facility])     Assessment: 38 yo M with bilateral PE s/p TNK (2/27 @1156 ) now on [place] ECMO + Impella. Pharmacy consulted for bivalirudin for anticoagulation.   S/p impella CP >5.5 3/3 - bivalirudin was previously held with oozing at insertion site but restart 3/4. Patient underwent mitraclip 3/5.  Bival transitioned to heparin 3/7 after decannulation.  Impella 5.5 removed 3/13. Heparin restarted 3/13. Hep in CRRT stopped 3/15 with CRRT stopping.   Heparin level came back therapeutic at 0.47, on 1900 units/hr. CBC pending.  Goal of Therapy:  Heparin level 0.3-0.7 units/mL Monitor platelets by anticoagulation protocol: Yes   Plan:  Continue heparin infusion at 1900 units/hr Heparin level and CBC daily  Monitor s/sx of bleeding    Fredonia Highland, PharmD, BCPS, [facility] Clinical Pharmacist [phone] Please check AMION  for all [facility] Pharmacy numbers 07/30/2023

## 2023-07-30 NOTE — Progress Notes (Addendum)
 [facility] PROGRESS NOTE    Progress Note  Aaron [name]  Aaron [name]:[id number] DOB: 1985/09/01 DOA: 07/09/2023 PCP: Patient, No Pcp Per     Brief Narrative:   Aaron [name] is an 38 y.o. male past medical history of hypothyroidism paroxysmal atrial fibrillation on anticoagulation (noncompliant with his Eliquis) with severe mitral regurgitation came into was Hospital on 07/09/2023 was along with shortness of breath, cough, nausea and diarrhea was found to have a PE and A-fib with RVR started on IV heparin, suspect thyrotoxicosis placed on beta-blocker methimazole and steroids, he rapidly deteriorated on 07/09/2022 went into subsequent respiratory arrest with V. tach V-fib arrest and intubated after 40 minutes of CPR, went into ATN requiring CRRT now on HD cardiology was consulted was placed on VA ECMO, on 07/19/2023 ECMO was decannulated, 07/23/2023 Impella was removed, eventually extubated but had to be intubated due to to severe distress was found to have mucous plugging and probable pneumonia.  MRI of the brain on 07/24/2023 shows small right cerebral infarcts and cerebral likely embolic.  Stabilized eventually extubated on 07/27/2023 stable off inotropes, CRRT stopped on 07/26/2023 eventually transition to hemodialysis.  Significant Events: 2/26 admitted +PE 2/27 Vtac/ Vfib arrest> rosc after 40 mins, s/p TNK,  VA ecmo 3/5 salvage TEER (transcatheter edge to edge mitral valve repair) 3/7 weaned off VA 3/8 bronch for mucus plugging, VA ecmo decannulation >impella 5.5 3/12 impella removed, extubation trial 2/28 CRRT 3/13 reintubated, bronch for recurrent plugging, HD cath rewired due to malfunction 3/15 CRRT stopped, remains anuric 3/16 awake f/c, extubated   Assessment/Plan:   Cardiogenic shock: Suspect respiratory failure was mainly due to PE in the setting of mitral regurgitation and possibly thyrotoxicosis.  Suspected downtime about 20 minutes. May return on 07/15/2023 at bedside. ECMO is  being managed by advanced heart failure team. He developed frequent PVCs and SVT requiring amiodarone bolus. Underwent successful mTEER on 07/16/23 with placement of x3 clips and improvement in MR from severe to mild.  Decannulated from ECMO on 07/19/2023 07/21/2023 EF was 35% status post mitral valve clip with anterior and septal hypokinesia. Impella was removed with 07/23/2023 CVVH stopped on 07/26/2023. Per advance heart failure team.  Severe mitral regurgitation: Now status post mTEER on 08/04/2023. Severe mitral regurg is improved.  Acute respiratory failure with hypoxia pulmonary hypertension/acute PE: Is now off nitrous oxide, Swan-Ganz is discontinued. PE without right heart strain, now on IV heparin. Has been slowly been weaned to room air Once permanent dialysis access has been placed can transition to oral Eliquis.  VT/NSVT: Suspect secondary to Impella now out he required several doses of amiodarone bolus. He is now on mexiletine and amiodarone per tube Remains in sinus rhythm.  Hyperthyroidism/possibly thyrotoxicosis: Continue methimazole. Need to check TSH in 4 weeks.  ID: Now off Vanco meropenem and micafungin. Continues to remain afebrile, white count is trending down. Suspect primarily pulmonary likely due primary lung infection, blood culture data has shown no growth.  Normocytic anemia: Repeat hemoglobin greater than 7. Today is 8.3.  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 On CRRT since 07/11/2023. A subclavian catheter was placed by CCM. Last HD on 07/28/2023. Further management per renal.  Acute small right CVA: MRI of the head on 07/24/2022 shows marked fluid to the right cerebellar and cerebral hemisphere. Suspect embolic, continue IV heparin. Following commands. For swallowing evaluation today. Transition to Eliquis once permanent HD access has been placed.  Nutrition/moderate protein caloric  malnutrition: Continue tube  feedings. Nutrition is on board.  Deconditioning: PT OT to continue to work with them. Try to set him as much as we can. Hopefully PT OT can start getting him to the chair soon Will need some kind of rehab possibly inpatient rehab.   Stage II sacral cubitus ulcer:  RN Pressure Injury Documentation: Pressure Injury 07/23/23 Sacrum Mid Stage 2 -  Partial thickness loss of dermis presenting as a shallow open injury with a red, pink wound bed without slough. light pink (Active)  07/23/23 1200  Location: Sacrum  Location Orientation: Mid  Staging: Stage 2 -  Partial thickness loss of dermis presenting as a shallow open injury with a red, pink wound bed without slough.  Wound Description (Comments): light pink  Present on Admission: No  Dressing Type Foam - Lift dressing to assess site every shift 07/30/23 0000  DVT prophylaxis: lovenoxn Family Communication: Girlfriend Status is: Inpatient Remains inpatient appropriate because: Shock    Code Status:     Code Status Orders  (From admission, onward)           Start     Ordered   07/10/23 1658  Full code  Continuous       Comments: Full code with ACLS Protocol WITH NO CHEST COMPRESSIONS  Question:  By:  Answer:  Consent: discussion documented in EHR   07/10/23 1657           Code Status History     Date Active Date Inactive Code Status Order ID Comments User Context   07/10/2023 0039 07/10/2023 1657 Full Code [id number]  Charlsie Quest, MD ED   03/20/2023 0926 03/22/2023 1621 Full Code [id number]  Maryln Gottron, MD ED   03/30/2022 0656 04/01/2022 2126 Full Code [id number]  Angie Fava, DO ED   10/19/2021 2359 10/23/2021 1924 Full Code [id number]  Tannenbaum, [place], MD Inpatient   08/16/2016 0633 08/16/2016 1236 Full Code [id number]  Devoria Albe, MD ED         IV Access:   Peripheral IV   Procedures and diagnostic studies:   DG Swallowing Func-Speech Pathology Result Date:  07/29/2023 Table formatting from the original result was not included. Modified Barium Swallow Study Patient Details Name: Aaron [name] MRN: [id number] Date of Birth: 06-Sep-1985 Today's Date: 07/29/2023 HPI/PMH: HPI: Aaron [name] is a 37 yo male presenting to ED 2/26 with chest pain and dyspnea. Admitted with bilateral lower lobe pulmonary emboli. VT/VF cardiac arrest 2/27, requiring 40 minutes of CPR and multiple shocks. Impella placed 2/27-3/12. ECMO initiated 2/27-3/8. CRRT stopped 3/15. ETT 2/27-3/12, reintubated 3/12-3/16 s/p mucus plugging and L lung opacification. 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 PMH includes hypothyroidism, paroxysmal A-fib, severe mitral regurgitation Clinical Impression: Pt exhibits severe pharyngeal dysphagia with concern for glottal insufficiency after prolonged intubation. He remains aphonic with a huff-like cough that is ineffective at clearing his airway. Thin and nectar thick liquids initially reached the level of the vocal folds before progressing further and being silently aspirated (PAS 8). A L and R head turn resulted in gross silent aspiration. Pt denies the urge to cough and his cued cough lacks crispness and strength. Recommend Dys 3 solids with honey thick liquids via cup or spoon and full supervision. Given difference in presentation when pt performed head turns in addition to aphonia, recommend ENT consult to assess vocal fold function. SLP will continue following. DIGEST Swallow Severity  Rating*  Safety: 4  Efficiency: 1  Overall Pharyngeal Swallow Severity: 3 (severe) 1: mild; 2: moderate; 3: severe; 4: profound *The Dynamic Imaging Grade of Swallowing Toxicity is standardized for the head and neck cancer population, however, demonstrates promising clinical applications across populations to standardize the clinical rating of pharyngeal swallow safety and severity.  Factors that may increase risk of adverse event in presence of aspiration Aaron [name] & Clearance Coots 2021): Factors that may increase risk of adverse event in presence of aspiration Aaron [name] & Clearance Coots 2021): Poor general health and/or compromised immunity; Frail or deconditioned; Presence of tubes (ETT, trach, NG, etc.) Recommendations/Plan: Swallowing Evaluation Recommendations Swallowing Evaluation Recommendations Recommendations: PO diet PO Diet Recommendation: Dysphagia 3 (Mechanical soft); Moderately thick liquids (Level 3, honey thick) Liquid Administration via: Spoon; Cup Medication Administration: Crushed with puree Supervision: Staff to assist with self-feeding; Full supervision/cueing for swallowing strategies Swallowing strategies  : Minimize environmental distractions; Slow rate; Small bites/sips Postural changes: Position pt fully upright for meals; Stay upright 30-60 min after meals Oral care recommendations: Oral care QID (4x/day); Oral care before PO; Use suctioning for oral care Recommended consults: Consider ENT consultation Caregiver Recommendations: Avoid jello, ice cream, thin soups, popsicles; Remove water pitcher Treatment Plan Treatment Plan Treatment recommendations: Therapy as outlined in treatment plan below Follow-up recommendations: Acute inpatient rehab (3 hours/day) Functional status assessment: Patient has had a recent decline in their functional status and demonstrates the ability to make significant improvements in function in a reasonable and predictable amount of time. Treatment frequency: Min 2x/week Treatment duration: 2 weeks Interventions: Aspiration precaution training; Compensatory techniques; Patient/family education; Trials of upgraded texture/liquids; Diet toleration management by SLP; Respiratory muscle strength training Recommendations Recommendations for follow up therapy are one component of a multi-disciplinary discharge planning process, led by the attending physician.   Recommendations may be updated based on patient status, additional functional criteria and insurance authorization. Assessment: Orofacial Exam: Orofacial Exam Oral Cavity: Oral Hygiene: WFL Oral Cavity - Dentition: Adequate natural dentition Orofacial Anatomy: WFL Oral Motor/Sensory Function: WFL Anatomy: Anatomy: WFL Boluses Administered: Boluses Administered Boluses Administered: Thin liquids (Level 0); Mildly thick liquids (Level 2, nectar thick); Moderately thick liquids (Level 3, honey thick); Puree; Solid  Oral Impairment Domain: Oral Impairment Domain Lip Closure: Interlabial escape, no progression to anterior lip Tongue control during bolus hold: Cohesive bolus between tongue to palatal seal Bolus preparation/mastication: Timely and efficient chewing and mashing Bolus transport/lingual motion: Brisk tongue motion Oral residue: Complete oral clearance Location of oral residue : N/A Initiation of pharyngeal swallow : Posterior laryngeal surface of the epiglottis  Pharyngeal Impairment Domain: Pharyngeal Impairment Domain Soft palate elevation: No bolus between soft palate (SP)/pharyngeal wall (PW) Laryngeal elevation: Complete superior movement of thyroid cartilage with complete approximation of arytenoids to epiglottic petiole Anterior hyoid excursion: Complete anterior movement Epiglottic movement: Complete inversion Laryngeal vestibule closure: Complete, no air/contrast in laryngeal vestibule Pharyngeal stripping wave : Present - complete Pharyngeal contraction (A/P view only): N/A Pharyngoesophageal segment opening: Complete distension and complete duration, no obstruction of flow Tongue base retraction: Trace column of contrast or air between tongue base and PPW Pharyngeal residue: Trace residue within or on pharyngeal structures Location of pharyngeal residue: Valleculae; Pyriform sinuses  Esophageal Impairment Domain: No data recorded Pill: No data recorded Penetration/Aspiration Scale Score:  Penetration/Aspiration Scale Score 1.  Material does not enter airway: Moderately thick liquids (Level 3, honey thick); Puree; Solid 5.  Material enters airway, CONTACTS cords and not ejected out: Mildly thick liquids (Level 2, nectar  thick) 8.  Material enters airway, passes BELOW cords without attempt by patient to eject out (silent aspiration) : Thin liquids (Level 0) Compensatory Strategies: Compensatory Strategies Compensatory strategies: Yes Straw: Ineffective Ineffective Straw: Thin liquid (Level 0); Mildly thick liquid (Level 2, nectar thick) Chin tuck: Effective; Ineffective Effective Chin Tuck: Mildly thick liquid (Level 2, nectar thick) Ineffective Chin Tuck: Thin liquid (Level 0) Left head turn: Ineffective Ineffective Left Head Turn: Thin liquid (Level 0) Right head turn: Ineffective Ineffective Right Head Turn: Thin liquid (Level 0)   General Information: Caregiver present: No  Diet Prior to this Study: NPO; Cortrak/Small bore NG tube   Temperature : Normal   Respiratory Status: WFL   Supplemental O2: Nasal cannula   History of Recent Intubation: Yes  Behavior/Cognition: Alert; Cooperative; Requires cueing Self-Feeding Abilities: Dependent for feeding Baseline vocal quality/speech: Aphonic Volitional Cough: Able to elicit Volitional Swallow: Able to elicit Exam Limitations: No limitations Goal Planning: Prognosis for improved oropharyngeal function: Good Barriers to Reach Goals: Cognitive deficits; Time post onset; Severity of deficits No data recorded Patient/Family Stated Goal: none stated Consulted and agree with results and recommendations: Patient; Nurse Pain: Pain Assessment Pain Assessment: No/denies pain Faces Pain Scale: 0 End of Session: Start Time:SLP Start Time (ACUTE ONLY): 1255 Stop Time: SLP Stop Time (ACUTE ONLY): 1316 Time Calculation:SLP Time Calculation (min) (ACUTE ONLY): 21 min Charges: SLP Evaluations $ SLP Speech Visit: 1 Visit SLP Evaluations $BSS Swallow: 1 Procedure $MBS  Swallow: 1 Procedure SLP visit diagnosis: SLP Visit Diagnosis: Dysphagia, pharyngeal phase (R13.13) Past Medical History: Past Medical History: Diagnosis Date  Atrial fibrillation (HCC)   Hyperthyroidism   Mitral regurgitation   NSTEMI (non-ST elevated myocardial infarction) [facility])  Past Surgical History: Past Surgical History: Procedure Laterality Date  ECMO CANNULATION N/A 07/10/2023  Procedure: ECMO CANNULATION;  Surgeon: Dolores Patty, MD;  Location: [facility] INVASIVE CV LAB;  Service: Cardiovascular;  Laterality: N/A;  EMBOLECTOMY Left 07/18/2023  Procedure: EMBOLECTOMY Left Femoral, Popliteal and iliac arterys,  Repair Left common femoral artery.;  Surgeon: Victorino Sparrow, MD;  Location: [facility] OR;  Service: Vascular;  Laterality: Left;  INTRAOPERATIVE TRANSESOPHAGEAL ECHOCARDIOGRAM N/A 07/23/2023  Procedure: ECHOCARDIOGRAM, TRANSESOPHAGEAL, INTRAOPERATIVE;  Surgeon: Loreli Slot, MD;  Location: [facility] OR;  Service: Open Heart Surgery;  Laterality: N/A;  LEFT HEART CATH AND CORONARY ANGIOGRAPHY N/A 10/22/2021  Procedure: LEFT HEART CATH AND CORONARY ANGIOGRAPHY;  Surgeon: Yvonne Kendall, MD;  Location: [facility] INVASIVE CV LAB;  Service: Cardiovascular;  Laterality: N/A;  PLACEMENT OF IMPELLA LEFT VENTRICULAR ASSIST DEVICE Right 07/14/2023  Procedure: PLACEMENT OF RIGHT AXILLARY IMPELLA 5.5 LEFT VENTRICULAR ASSIST DEVICE;  Surgeon: Loreli Slot, MD;  Location: [facility] OR;  Service: Open Heart Surgery;  Laterality: Right;  REMOVAL OF IMPELLA LEFT VENTRICULAR ASSIST DEVICE Right 07/14/2023  Procedure: REMOVAL OF CP RIGHT FEMORAL IMPELLA LEFT VENTRICULAR ASSIST DEVICE;  Surgeon: Loreli Slot, MD;  Location: [facility] OR;  Service: Open Heart Surgery;  Laterality: Right;  REMOVAL OF IMPELLA LEFT VENTRICULAR ASSIST DEVICE N/A 07/23/2023  Procedure: REMOVAL, CARDIAC ASSIST DEVICE, IMPELLA;  Surgeon: Loreli Slot, MD;  Location: [facility] OR;  Service: Open Heart Surgery;  Laterality: N/A;  TRANSCATHETER MITRAL EDGE TO  EDGE REPAIR N/A 07/16/2023  Procedure: TRANSCATHETER MITRAL EDGE TO EDGE REPAIR;  Surgeon: Tonny Bollman, MD;  Location: [facility] INVASIVE CV LAB;  Service: Cardiovascular;  Laterality: N/A;  TRANSESOPHAGEAL ECHOCARDIOGRAM (CATH LAB) N/A 07/16/2023  Procedure: TRANSESOPHAGEAL ECHOCARDIOGRAM;  Surgeon: Tonny Bollman, MD;  Location[facility] INVASIVE CV LAB;  Service: Cardiovascular;  Laterality: N/A;  VENTRICULAR ASSIST DEVICE INSERTION N/A 07/10/2023  Procedure: VENTRICULAR ASSIST DEVICE INSERTION;  Surgeon: Dolores Patty, MD;  Location: [facility] INVASIVE CV LAB;  Service: Cardiovascular;  Laterality: N/A; Gwynneth Aliment, M.A., CF-SLP Speech Language Pathology, Acute Rehabilitation Services Secure Chat preferred [phone] 07/29/2023, 1:49 PM    Medical Consultants:   None.   Subjective:    Aaron [name] no complaints this morning.  Objective:    Vitals:   07/30/23 0453 07/30/23 0500 07/30/23 0600 07/30/23 0700  BP:  137/79 (!) 142/83 (!) 143/82  Pulse: 83 78 82 84  Resp: (!) 30 (!) 28 16 (!) 0  Temp: 99.3 F (37.4 C)     TempSrc: Oral     SpO2: 96% 97% 98% 97%  Weight: 76.6 kg     Height:       SpO2: 97 % O2 Flow Rate (L/min): (S) 0 L/min FiO2 (%): 40 %   Intake/Output Summary (Last 24 hours) at 07/30/2023 0726 Last data filed at 07/30/2023 0655 Gross per 24 hour  Intake 2919.16 ml  Output 300 ml  Net 2619.16 ml   Filed Weights   07/28/23 0328 07/28/23 0830 07/30/23 0453  Weight: 74 kg 74 kg 76.6 kg    Exam: General exam: In no acute distress. Respiratory system: Good air movement and clear to auscultation. Cardiovascular system: S1 & S2 heard, RRR. No JVD. Gastrointestinal system: Abdomen is nondistended, soft and nontender.  Extremities: No pedal edema. Skin: No rashes, lesions or ulcers Psychiatry: Judgement and insight appear normal. Mood & affect appropriate.  Data Reviewed:    Labs: Basic Metabolic Panel: Recent Labs  Lab 07/25/23 0208 07/25/23 0407 07/26/23 0437  07/26/23 0443 07/27/23 0500 07/27/23 1503 07/27/23 1608 07/28/23 0512 07/29/23 0427 07/30/23 0500  NA 130*   < > 134*  133*   < > 137  133* 134* 135 133* 132* 131*  K 3.8   < > 3.9  3.8   < > 4.4  4.3 4.4 4.5 4.6 4.6 4.6  CL 97*   < > 100  101   < > 102  102 102  --  103 97* 95*  CO2 20*   < > 21*  22   < > 17*  20* 16*  --  18* 21* 19*  GLUCOSE 112*   < > 150*  153*   < > 132*  136* 148*  --  170* 151* 170*  BUN 54*   < > 47*  46*   < > 75*  75* 90*  --  116* 90* 130*  CREATININE 3.39*   < > 3.39*  3.24*   < > 5.69*  5.80* 7.40*  --  8.93* 7.20* 9.41*  CALCIUM 8.0*   < > 8.2*  8.0*   < > 8.0*  7.8* 7.9*  --  7.9* 7.7* 7.9*  MG 2.3  --  2.2  --  2.3  --   --  2.6* 2.3  --   PHOS 4.1   < > 3.8  3.8   < > 4.5  4.5 4.1  --  4.3 5.0* 5.3*   < > = values in this interval not displayed.   GFR Estimated Creatinine Clearance: 11.5 mL/min (A) (by C-G formula based on SCr of 9.41 mg/dL (H)). Liver Function Tests: Recent Labs  Lab 07/26/23 0437 07/26/23 1610 07/27/23 0500 07/27/23 1503 07/28/23 0512 07/29/23 0427 07/30/23 0500  AST 49*  --  46*  --  42* 396* 60*  ALT 25  --  27  --  26 147* 72*  ALKPHOS 128*  --  118  --  115 500* 282*  BILITOT 1.0  --  1.0  --  1.1 0.9 0.9  PROT 7.3  --  6.8  --  6.5 6.6 6.1*  ALBUMIN 2.2*  2.1*   < > 2.2*  2.1* 2.1* 2.1*  2.1* 2.2* 2.1*  2.1*   < > = values in this interval not displayed.   No results for input(s): "LIPASE", "AMYLASE" in the last 168 hours. Recent Labs  Lab 07/24/23 1155  AMMONIA 63*   Coagulation profile No results for input(s): "INR", "PROTIME" in the last 168 hours. COVID-19 Labs  Recent Labs    07/28/23 0512  LDH 427*    Lab Results  Component Value Date   SARSCOV2NAA NEGATIVE 07/09/2023   SARSCOV2NAA NEGATIVE 02/05/2022    CBC: Recent Labs  Lab 07/25/23 0407 07/25/23 0413 07/26/23 0437 07/26/23 0443 07/26/23 1609 07/27/23 0500 07/27/23 1608 07/28/23 0512 07/29/23 0427  WBC  30.7*  --  16.7*  --   --  20.9*  --  18.4* 17.6*  HGB 7.4*   < > 7.5*   < > 8.2* 7.6* 11.9* 7.6* 8.3*  HCT 23.3*   < > 23.8*   < > 24.0* 24.2* 35.0* 24.2* 26.2*  MCV 92.8  --  93.7  --   --  96.0  --  94.9 94.2  PLT 234  --  185  --   --  212  --  307 357   < > = values in this interval not displayed.   Cardiac Enzymes: No results for input(s): "CKTOTAL", "CKMB", "CKMBINDEX", "TROPONINI" in the last 168 hours. BNP (last 3 results) No results for input(s): "PROBNP" in the last 8760 hours. CBG: Recent Labs  Lab 07/29/23 1218 07/29/23 1655 07/29/23 1942 07/29/23 2356 07/30/23 0501  GLUCAP 132* 170* 159* 157* 173*   D-Dimer: No results for input(s): "DDIMER" in the last 72 hours. Hgb A1c: No results for input(s): "HGBA1C" in the last 72 hours. Lipid Profile: No results for input(s): "CHOL", "HDL", "LDLCALC", "TRIG", "CHOLHDL", "LDLDIRECT" in the last 72 hours.  Thyroid function studies: No results for input(s): "TSH", "T4TOTAL", "T3FREE", "THYROIDAB" in the last 72 hours.  Invalid input(s): "FREET3" Anemia work up: No results for input(s): "VITAMINB12", "FOLATE", "FERRITIN", "TIBC", "IRON", "RETICCTPCT" in the last 72 hours. Sepsis Labs: Recent Labs  Lab 07/24/23 1155 07/24/23 2010 07/25/23 0407 07/25/23 0935 07/26/23 0437 07/27/23 0500 07/28/23 0512 07/29/23 0427  PROCALCITON 6.84  --   --  6.36  --   --   --   --   WBC  --  35.9*   < >  --  16.7* 20.9* 18.4* 17.6*  LATICACIDVEN  --  0.8  --   --   --   --   --   --    < > = values in this interval not displayed.   Microbiology Recent Results (from the past 240 hours)  Surgical PCR screen     Status: None   Collection Time: 07/23/23 12:34 AM   Specimen: Nasal Mucosa; Nasal Swab  Result Value Ref Range Status   MRSA, PCR NEGATIVE NEGATIVE Final   Staphylococcus aureus NEGATIVE NEGATIVE Final    Comment: (NOTE) The Xpert SA Assay (FDA approved for NASAL specimens in patients 23 years of age and older), is one  component of a comprehensive surveillance program. It  is not intended to diagnose infection nor to guide or monitor treatment. Performed at [facility], [address], [place], [place] [address]   Culture, Respiratory w Gram Stain     Status: None   Collection Time: 07/24/23  5:17 AM   Specimen: Tracheal Aspirate; Respiratory  Result Value Ref Range Status   Specimen Description TRACHEAL ASPIRATE  Final   Special Requests NONE  Final   Gram Stain NO WBC SEEN NO ORGANISMS SEEN   Final   Culture   Final    FEW Normal respiratory flora-no Staph aureus or Pseudomonas seen Performed at [facility], [address] [place], [place] [address]    Report Status 07/26/2023 FINAL  Final     Medications:    amiodarone  200 mg Oral BID   arformoterol  15 mcg Nebulization BID   ascorbic acid  250 mg Oral BID   aspirin  81 mg Oral Daily   budesonide (PULMICORT) nebulizer solution  0.25 mg Nebulization BID   Chlorhexidine Gluconate Cloth  6 each Topical Q0600   darbepoetin (ARANESP) injection - DIALYSIS  200 mcg Subcutaneous Q Fri-1800   feeding supplement (PROSource TF20)  60 mL Per Tube BID   fiber supplement (BANATROL TF)  60 mL Per Tube BID   folic acid  1 mg Oral Daily   insulin aspart  0-20 Units Subcutaneous Q4H   methimazole  10 mg Oral Daily   mexiletine  250 mg Oral Q12H   multivitamin  1 tablet Oral QHS   mouth rinse  15 mL Mouth Rinse 4 times per day   revefenacin  175 mcg Nebulization Daily   sodium chloride flush  3 mL Intravenous Q12H   thiamine  100 mg Per Tube Daily   Or   thiamine  100 mg Intravenous Daily   zinc sulfate (50mg  elemental zinc)  220 mg Oral Daily   Continuous Infusions:  anticoagulant sodium citrate     dexmedetomidine (PRECEDEX) IV infusion Stopped (07/28/23 1354)   feeding supplement (VITAL 1.5 CAL) 75 mL/hr at 07/30/23 0655   heparin 1,900 Units/hr (07/30/23 0655)      LOS: 21 days   Marinda Elk  [facility]  07/30/2023, 7:26 AM

## 2023-07-30 NOTE — Progress Notes (Signed)
 Speech Language Pathology Treatment: Dysphagia  Patient Details Name: Aaron [name] MRN: [id number] DOB: 1985/11/14 Today's Date: 07/30/2023 Time: 4696-2952 SLP Time Calculation (min) (ACUTE ONLY): 17 min  Assessment / Plan / Recommendation Clinical Impression  Pt reports having a very limited appetite, in part due to distaste for thickened liquids. He seemingly has an increased amount of secretions this date, but can independently use the Yankauer to clear them from his oral cavity as needed. His vocal quality subjectively sounds stronger, although remains low in volume. He declined solids, but agreed to take cup and spoon sips of honey thick liquids. Pt completed 10 reps of EMST set to 35 cm/H2O with great effort. Provided education regarding use of EMST with pt and encouraged pt and nursing staff to continue completing 10 reps 3x/day. Recommend he continue current diet of Dys 3 solids and honey thick liquids for now. SLP will continue following.    HPI HPI: Aaron [name] is a 38 yo male presenting to ED 2/26 with chest pain and dyspnea. Admitted with bilateral lower lobe pulmonary emboli. VT/VF cardiac arrest 2/27, requiring 40 minutes of CPR and multiple shocks. Impella placed 2/27-3/12. ECMO initiated 2/27-3/8. CRRT stopped 3/15. ETT 2/27-3/12, reintubated 3/12-3/16 s/p mucus plugging and L lung opacification. 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 PMH includes hypothyroidism, paroxysmal A-fib, severe mitral regurgitation      SLP Plan  Continue with current plan of care      Recommendations for follow up therapy are one component of a multi-disciplinary discharge planning process, led by the attending physician.  Recommendations may be updated based on patient status, additional functional criteria and insurance authorization.    Recommendations  Diet recommendations: Dysphagia 3 (mechanical  soft);Honey-thick liquid Liquids provided via: Teaspoon;Cup Medication Administration: Crushed with puree Supervision: Staff to assist with self feeding;Full supervision/cueing for compensatory strategies Compensations: Minimize environmental distractions;Slow rate;Small sips/bites Postural Changes and/or Swallow Maneuvers: Seated upright 90 degrees                  Oral care QID;Oral care prior to ice chip/H20   Frequent or constant Supervision/Assistance Dysphagia, pharyngeal phase (R13.13)     Continue with current plan of care     Gwynneth Aliment, M.A., CF-SLP Speech Language Pathology, Acute Rehabilitation Services  Secure Chat preferred [phone]   07/30/2023, 10:43 AM

## 2023-07-30 NOTE — Progress Notes (Signed)
 [facility] NEPHROLOGY PROGRESS NOTE  Assessment/ Plan: Pt is a 38 y.o. yo male  likely ATN after cardiac arrest, cardiogenic shock on ECMO ( decan 3/8) and Impella ( removed 3/12), AHRF/VDRF   # Dialysis dependent anuric AKI 2/2 ATN from cardiogenic shock on CRRT since 07/11/23, Left subclavian temp HD cath placed by CCM;  Tolerating CRRT well however getting interruptions because of procedures, imaging studies as well as clotting.  The potassium level was running high --  CRRT prescription was changed, then K in the 3's-  change again to 4 K pre and post and cont 2 K dialysate-  UF around 50 / hour-  volume does not seem terrible.   Clotting was an issue.  CRRT was stopped on 3/15.  No UOP and numbers rising.  Tolerated IHD 3/17; no signs of recovery and we will plan on next treatment today (Lt SCV temp), orders already written.   Bladder scan only on 3/17.  Will maintain on MWF regimen, daily evaluation for any signs of renal recovery  # Cardiogenic Shock,  Impella removed; status post transcutaneous mitral valve repair; previously on ECMO. per cardiology.  # VT + PEA SCA, as per cardiology team. # PE on heparin per CCM # Anemia, Hb  dropping--  added ESA, supportive care # Hyperkalemia, less present .  Off  CRRT 3/15. # Severe MR s/p TEER 3/5   Subjective:  Extubated, awake and appropriate; fianc not bedside today.  No urine output.recorded and bladder scan only 110 cc on 3/17.  CRRT stopped 3/15.   Objective Vital signs in last 24 hours: Vitals:   07/30/23 0700 07/30/23 0800 07/30/23 0900 07/30/23 0915  BP: (!) 143/82 (!) 150/95 (!) 143/83 (!) 140/83  Pulse: 84 87 85 82  Resp: (!) 0 (!) 23 (!) 26 19  Temp:  98.2 F (36.8 C)    TempSrc:  Oral    SpO2: 97% 97% 99% 98%  Weight:      Height:       Weight change: 2.6 kg  Intake/Output Summary (Last 24 hours) at 07/30/2023 1001 Last data filed at 07/30/2023 0900 Gross per 24 hour  Intake 2601.64 ml   Output --  Net 2601.64 ml       Labs: RENAL PANEL Recent Labs  Lab 07/25/23 0208 07/25/23 0407 07/26/23 0437 07/26/23 0443 07/27/23 0500 07/27/23 1503 07/27/23 1608 07/28/23 0512 07/29/23 0427 07/30/23 0500  NA 130*   < > 134*  133*   < > 137  133* 134* 135 133* 132* 131*  K 3.8   < > 3.9  3.8   < > 4.4  4.3 4.4 4.5 4.6 4.6 4.6  CL 97*   < > 100  101   < > 102  102 102  --  103 97* 95*  CO2 20*   < > 21*  22   < > 17*  20* 16*  --  18* 21* 19*  GLUCOSE 112*   < > 150*  153*   < > 132*  136* 148*  --  170* 151* 170*  BUN 54*   < > 47*  46*   < > 75*  75* 90*  --  116* 90* 130*  CREATININE 3.39*   < > 3.39*  3.24*   < > 5.69*  5.80* 7.40*  --  8.93* 7.20* 9.41*  CALCIUM 8.0*   < > 8.2*  8.0*   < > 8.0*  7.8* 7.9*  --  7.9* 7.7* 7.9*  MG 2.3  --  2.2  --  2.3  --   --  2.6* 2.3  --   PHOS 4.1   < > 3.8  3.8   < > 4.5  4.5 4.1  --  4.3 5.0* 5.3*  ALBUMIN 2.0*   < > 2.2*  2.1*   < > 2.2*  2.1* 2.1*  --  2.1*  2.1* 2.2* 2.1*  2.1*   < > = values in this interval not displayed.    Liver Function Tests: Recent Labs  Lab 07/28/23 0512 07/29/23 0427 07/30/23 0500  AST 42* 396* 60*  ALT 26 147* 72*  ALKPHOS 115 500* 282*  BILITOT 1.1 0.9 0.9  PROT 6.5 6.6 6.1*  ALBUMIN 2.1*  2.1* 2.2* 2.1*  2.1*   No results for input(s): "LIPASE", "AMYLASE" in the last 168 hours. Recent Labs  Lab 07/24/23 1155  AMMONIA 63*   CBC: Recent Labs    07/26/23 1609 07/27/23 0500 07/27/23 1608 07/28/23 0512 07/29/23 0427  HGB 8.2* 7.6* 11.9* 7.6* 8.3*  MCV  --  96.0  --  94.9 94.2    Cardiac Enzymes: No results for input(s): "CKTOTAL", "CKMB", "CKMBINDEX", "TROPONINI" in the last 168 hours. CBG: Recent Labs  Lab 07/29/23 1655 07/29/23 1942 07/29/23 2356 07/30/23 0501 07/30/23 0816  GLUCAP 170* 159* 157* 173* 176*    Iron Studies: No results for input(s): "IRON", "TIBC", "TRANSFERRIN", "FERRITIN" in the last 72 hours. Studies/Results: DG  Swallowing Func-Speech Pathology Result Date: 07/29/2023 Table formatting from the original result was not included. Modified Barium Swallow Study Patient Details Name: Aaron [name] MRN: [id number] Date of Birth: June 21, 1985 Today's Date: 07/29/2023 HPI/PMH: HPI: Aaron [name] is a 38 yo male presenting to ED 2/26 with chest pain and dyspnea. Admitted with bilateral lower lobe pulmonary emboli. VT/VF cardiac arrest 2/27, requiring 40 minutes of CPR and multiple shocks. Impella placed 2/27-3/12. ECMO initiated 2/27-3/8. CRRT stopped 3/15. ETT 2/27-3/12, reintubated 3/12-3/16 s/p mucus plugging and L lung opacification. 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 PMH includes hypothyroidism, paroxysmal A-fib, severe mitral regurgitation Clinical Impression: Pt exhibits severe pharyngeal dysphagia with concern for glottal insufficiency after prolonged intubation. He remains aphonic with a huff-like cough that is ineffective at clearing his airway. Thin and nectar thick liquids initially reached the level of the vocal folds before progressing further and being silently aspirated (PAS 8). A L and R head turn resulted in gross silent aspiration. Pt denies the urge to cough and his cued cough lacks crispness and strength. Recommend Dys 3 solids with honey thick liquids via cup or spoon and full supervision. Given difference in presentation when pt performed head turns in addition to aphonia, recommend ENT consult to assess vocal fold function. SLP will continue following. DIGEST Swallow Severity Rating*  Safety: 4  Efficiency: 1  Overall Pharyngeal Swallow Severity: 3 (severe) 1: mild; 2: moderate; 3: severe; 4: profound *The Dynamic Imaging Grade of Swallowing Toxicity is standardized for the head and neck cancer population, however, demonstrates promising clinical applications across populations to standardize the clinical rating  of pharyngeal swallow safety and severity. Factors that may increase risk of adverse event in presence of aspiration [name]: Factors that may increase risk of adverse event in presence of aspiration [name]: Poor general health and/or compromised immunity; Frail or deconditioned; Presence of tubes (ETT, trach,  NG, etc.) Recommendations/Plan: Swallowing Evaluation Recommendations Swallowing Evaluation Recommendations Recommendations: PO diet PO Diet Recommendation: Dysphagia 3 (Mechanical soft); Moderately thick liquids (Level 3, honey thick) Liquid Administration via: Spoon; Cup Medication Administration: Crushed with puree Supervision: Staff to assist with self-feeding; Full supervision/cueing for swallowing strategies Swallowing strategies  : Minimize environmental distractions; Slow rate; Small bites/sips Postural changes: Position pt fully upright for meals; Stay upright 30-60 min after meals Oral care recommendations: Oral care QID (4x/day); Oral care before PO; Use suctioning for oral care Recommended consults: Consider ENT consultation Caregiver Recommendations: Avoid jello, ice cream, thin soups, popsicles; Remove water pitcher Treatment Plan Treatment Plan Treatment recommendations: Therapy as outlined in treatment plan below Follow-up recommendations: Acute inpatient rehab (3 hours/day) Functional status assessment: Patient has had a recent decline in their functional status and demonstrates the ability to make significant improvements in function in a reasonable and predictable amount of time. Treatment frequency: Min 2x/week Treatment duration: 2 weeks Interventions: Aspiration precaution training; Compensatory techniques; Patient/family education; Trials of upgraded texture/liquids; Diet toleration management by SLP; Respiratory muscle strength training Recommendations Recommendations for follow up therapy are one component of a multi-disciplinary discharge planning  process, led by the attending physician.  Recommendations may be updated based on patient status, additional functional criteria and insurance authorization. Assessment: Orofacial Exam: Orofacial Exam Oral Cavity: Oral Hygiene: WFL Oral Cavity - Dentition: Adequate natural dentition Orofacial Anatomy: WFL Oral Motor/Sensory Function: WFL Anatomy: Anatomy: WFL Boluses Administered: Boluses Administered Boluses Administered: Thin liquids (Level 0); Mildly thick liquids (Level 2, nectar thick); Moderately thick liquids (Level 3, honey thick); Puree; Solid  Oral Impairment Domain: Oral Impairment Domain Lip Closure: Interlabial escape, no progression to anterior lip Tongue control during bolus hold: Cohesive bolus between tongue to palatal seal Bolus preparation/mastication: Timely and efficient chewing and mashing Bolus transport/lingual motion: Brisk tongue motion Oral residue: Complete oral clearance Location of oral residue : N/A Initiation of pharyngeal swallow : Posterior laryngeal surface of the epiglottis  Pharyngeal Impairment Domain: Pharyngeal Impairment Domain Soft palate elevation: No bolus between soft palate (SP)/pharyngeal wall (PW) Laryngeal elevation: Complete superior movement of thyroid cartilage with complete approximation of arytenoids to epiglottic petiole Anterior hyoid excursion: Complete anterior movement Epiglottic movement: Complete inversion Laryngeal vestibule closure: Complete, no air/contrast in laryngeal vestibule Pharyngeal stripping wave : Present - complete Pharyngeal contraction (A/P view only): N/A Pharyngoesophageal segment opening: Complete distension and complete duration, no obstruction of flow Tongue base retraction: Trace column of contrast or air between tongue base and PPW Pharyngeal residue: Trace residue within or on pharyngeal structures Location of pharyngeal residue: Valleculae; Pyriform sinuses  Esophageal Impairment Domain: No data recorded Pill: No data recorded  Penetration/Aspiration Scale Score: Penetration/Aspiration Scale Score 1.  Material does not enter airway: Moderately thick liquids (Level 3, honey thick); Puree; Solid 5.  Material enters airway, CONTACTS cords and not ejected out: Mildly thick liquids (Level 2, nectar thick) 8.  Material enters airway, passes BELOW cords without attempt by patient to eject out (silent aspiration) : Thin liquids (Level 0) Compensatory Strategies: Compensatory Strategies Compensatory strategies: Yes Straw: Ineffective Ineffective Straw: Thin liquid (Level 0); Mildly thick liquid (Level 2, nectar thick) Chin tuck: Effective; Ineffective Effective Chin Tuck: Mildly thick liquid (Level 2, nectar thick) Ineffective Chin Tuck: Thin liquid (Level 0) Left head turn: Ineffective Ineffective Left Head Turn: Thin liquid (Level 0) Right head turn: Ineffective Ineffective Right Head Turn: Thin liquid (Level 0)   General Information: Caregiver present: No  Diet Prior to this Study:  NPO; Cortrak/Small bore NG tube   Temperature : Normal   Respiratory Status: WFL   Supplemental O2: Nasal cannula   History of Recent Intubation: Yes  Behavior/Cognition: Alert; Cooperative; Requires cueing Self-Feeding Abilities: Dependent for feeding Baseline vocal quality/speech: Aphonic Volitional Cough: Able to elicit Volitional Swallow: Able to elicit Exam Limitations: No limitations Goal Planning: Prognosis for improved oropharyngeal function: Good Barriers to Reach Goals: Cognitive deficits; Time post onset; Severity of deficits No data recorded Patient/Family Stated Goal: none stated Consulted and agree with results and recommendations: Patient; Nurse Pain: Pain Assessment Pain Assessment: No/denies pain Faces Pain Scale: 0 End of Session: Start Time:SLP Start Time (ACUTE ONLY): 1255 Stop Time: SLP Stop Time (ACUTE ONLY): 1316 Time Calculation:SLP Time Calculation (min) (ACUTE ONLY): 21 min Charges: SLP Evaluations $ SLP Speech Visit: 1 Visit SLP Evaluations  $BSS Swallow: 1 Procedure $MBS Swallow: 1 Procedure SLP visit diagnosis: SLP Visit Diagnosis: Dysphagia, pharyngeal phase (R13.13) Past Medical History: Past Medical History: Diagnosis Date  Atrial fibrillation (HCC)   Hyperthyroidism   Mitral regurgitation   NSTEMI (non-ST elevated myocardial infarction) [facility])  Past Surgical History: Past Surgical History: Procedure Laterality Date  ECMO CANNULATION N/A 07/10/2023  Procedure: ECMO CANNULATION;  Surgeon: Dolores Patty, MD;  Location: [facility] INVASIVE CV LAB;  Service: Cardiovascular;  Laterality: N/A;  EMBOLECTOMY Left 07/18/2023  Procedure: EMBOLECTOMY Left Femoral, Popliteal and iliac arterys,  Repair Left common femoral artery.;  Surgeon: Victorino Sparrow, MD;  Location: [facility] OR;  Service: Vascular;  Laterality: Left;  INTRAOPERATIVE TRANSESOPHAGEAL ECHOCARDIOGRAM N/A 07/23/2023  Procedure: ECHOCARDIOGRAM, TRANSESOPHAGEAL, INTRAOPERATIVE;  Surgeon: Loreli Slot, MD;  Location: [facility] OR;  Service: Open Heart Surgery;  Laterality: N/A;  LEFT HEART CATH AND CORONARY ANGIOGRAPHY N/A 10/22/2021  Procedure: LEFT HEART CATH AND CORONARY ANGIOGRAPHY;  Surgeon: Yvonne Kendall, MD;  Location: [facility] INVASIVE CV LAB;  Service: Cardiovascular;  Laterality: N/A;  PLACEMENT OF IMPELLA LEFT VENTRICULAR ASSIST DEVICE Right 07/14/2023  Procedure: PLACEMENT OF RIGHT AXILLARY IMPELLA 5.5 LEFT VENTRICULAR ASSIST DEVICE;  Surgeon: Loreli Slot, MD;  Location: [facility] OR;  Service: Open Heart Surgery;  Laterality: Right;  REMOVAL OF IMPELLA LEFT VENTRICULAR ASSIST DEVICE Right 07/14/2023  Procedure: REMOVAL OF CP RIGHT FEMORAL IMPELLA LEFT VENTRICULAR ASSIST DEVICE;  Surgeon: Loreli Slot, MD;  Location: [facility] OR;  Service: Open Heart Surgery;  Laterality: Right;  REMOVAL OF IMPELLA LEFT VENTRICULAR ASSIST DEVICE N/A 07/23/2023  Procedure: REMOVAL, CARDIAC ASSIST DEVICE, IMPELLA;  Surgeon: Loreli Slot, MD;  Location: [facility] OR;  Service: Open Heart Surgery;  Laterality: N/A;   TRANSCATHETER MITRAL EDGE TO EDGE REPAIR N/A 07/16/2023  Procedure: TRANSCATHETER MITRAL EDGE TO EDGE REPAIR;  Surgeon: Tonny Bollman, MD;  Location[facility] INVASIVE CV LAB;  Service: Cardiovascular;  Laterality: N/A;  TRANSESOPHAGEAL ECHOCARDIOGRAM (CATH LAB) N/A 07/16/2023  Procedure: TRANSESOPHAGEAL ECHOCARDIOGRAM;  Surgeon: Tonny Bollman, MD;  Location: [facility] INVASIVE CV LAB;  Service: Cardiovascular;  Laterality: N/A;  VENTRICULAR ASSIST DEVICE INSERTION N/A 07/10/2023  Procedure: VENTRICULAR ASSIST DEVICE INSERTION;  Surgeon: Dolores Patty, MD;  Location: [facility] INVASIVE CV LAB;  Service: Cardiovascular;  Laterality: N/A; Gwynneth Aliment, M.A., CF-SLP Speech Language Pathology, Acute Rehabilitation Services Secure Chat preferred [phone] 07/29/2023, 1:49 PM    Medications: Infusions:  anticoagulant sodium citrate     dexmedetomidine (PRECEDEX) IV infusion Stopped (07/28/23 1354)   feeding supplement (VITAL 1.5 CAL) 75 mL/hr at 07/30/23 0900   heparin 1,900 Units/hr (07/30/23 0900)    Scheduled Medications:  amiodarone  200 mg Oral  BID   arformoterol  15 mcg Nebulization BID   ascorbic acid  250 mg Oral BID   aspirin  81 mg Oral Daily   budesonide (PULMICORT) nebulizer solution  0.25 mg Nebulization BID   Chlorhexidine Gluconate Cloth  6 each Topical Q0600   darbepoetin (ARANESP) injection - DIALYSIS  200 mcg Subcutaneous Q Fri-1800   feeding supplement (PROSource TF20)  60 mL Per Tube BID   fiber supplement (BANATROL TF)  60 mL Per Tube BID   folic acid  1 mg Oral Daily   insulin aspart  0-20 Units Subcutaneous Q4H   methimazole  10 mg Oral Daily   mexiletine  250 mg Oral Q12H   multivitamin  1 tablet Oral QHS   mouth rinse  15 mL Mouth Rinse 4 times per day   revefenacin  175 mcg Nebulization Daily   sodium chloride flush  3 mL Intravenous Q12H   thiamine  100 mg Per Tube Daily   Or   thiamine  100 mg Intravenous Daily   zinc sulfate (50mg  elemental zinc)  220 mg Oral Daily     have reviewed scheduled and prn medications.  Physical Exam: General: Critically ill looking male, alert  Heart:RRR, s1s2 nl Lungs: rhonchi. Abdomen:soft, non-distended Extremities:No edema Dialysis Access: Left subclavian temporary HD line.-  newest line placed 3/9  Cherell Colvin W 07/30/2023,10:01 AM  LOS: 21 days

## 2023-07-30 NOTE — Progress Notes (Signed)
 Received patient in bed to unit.  Alert and oriented.  Informed consent signed and in chart.   TX duration:3hrs  Patient tolerated well.  Transported back to the room  Alert, without acute distress.  Hand-off given to patient's nurse.   Access used: L subclavian - trialysis Access issues: poor flow - access is positional and unable to achieve prescribed BFR despite interventions - tx stopped due to poor flow and increased risk of ECC clotting due to frequent alarms and poor BF  Total UF removed: 1.7L Medication(s) given: post HD tx cvc heparin block   07/30/23 1745  Vitals  BP 129/82  Pulse Rate 95  ECG Heart Rate 97  Resp 17  Oxygen Therapy  SpO2 98 %  During Treatment Monitoring  Blood Flow Rate (mL/min) 250 mL/min  Arterial Pressure (mmHg) -81.41 mmHg  Venous Pressure (mmHg) 123.43 mmHg  TMP (mmHg) 19.19 mmHg  Ultrafiltration Rate (mL/min) 778 mL/min  Dialysate Flow Rate (mL/min) 300 ml/min  Duration of HD Treatment -hour(s) 3.03 hour(s)  Cumulative Fluid Removed (mL) per Treatment  1635.07  HD Safety Checks Performed Yes  Intra-Hemodialysis Comments Tx completed  Dialysis Fluid Bolus Normal Saline  Bolus Amount (mL) 300 mL  Post Treatment  Dialyzer Clearance Heavily streaked  Liters Processed 64.4  Fluid Removed (mL) 1700 mL  Tolerated HD Treatment Yes  Hemodialysis Catheter Left Subclavian  Placement Date/Time: 07/19/23 1051   Placed prior to admission: No  Time Out: Correct patient;Correct site;Correct procedure  Maximum sterile barrier precautions: Mask;Sterile gown;Sterile probe cover;Large sterile sheet;Cap;Hand hygiene;Sterile glove...  Site Condition No complications  Blue Lumen Status Flushed;Heparin locked  Red Lumen Status Flushed;Heparin locked  Catheter fill solution Heparin 1000 units/ml  Catheter fill volume (Arterial) 1.4 cc  Catheter fill volume (Venous) 1.4  Post treatment catheter status Capped and Clamped      Freddi Starr, RN Kidney  Dialysis Unit

## 2023-07-30 NOTE — Progress Notes (Signed)
 Physical Therapy Treatment Patient Details Name: Aaron [name] MRN: [id number] DOB: Dec 24, 1985 Today's Date: 07/30/2023   History of Present Illness Aaron [name] is a 38 y.o. male who presented to the ED on 2/26 for evaluation of chest pain and dyspnea. CTA revealed semental B lower lobe PE.  2/27 had VT/VF arrest, requiring of CPR and multiple shocks. Place on VA ECMO with impella CP. Impella removed 3/12, extubated 3/16. acute R hemisphere strokes on MRI 3/13  PMH:  PAF not adherent to Eliquis, chronic HFpEF, severe mitral regurgitation, hyperthyroidism    PT Comments  Pt in bed upon arrival and agreeable to PT session. Pt required MinA to roll and MinAx2 to move from sidelying to sit. Pt was able to sit EOB for ~3 minutes before becoming fatigued and requesting to return to supine. While seated EOB, pt needed MinA/CGA for balance as pt tends to lean anteriorly. Pt was able to complete LE exercises in supine with muscle fatigue evident after 5 reps. Pt is progressing towards goals. Pt would continue to benefit from >3hrs post acute rehab to work towards independence with mobility. Acute PT to follow.   BP 140/83; 84 BPM >98% SpO2 on RA     If plan is discharge home, recommend the following: Two people to help with walking and/or transfers;Two people to help with bathing/dressing/bathroom;Assistance with cooking/housework;Assistance with feeding;Assist for transportation;Help with stairs or ramp for entrance   Can travel by private vehicle      No  Equipment Recommendations  Rolling walker (2 wheels);Wheelchair (measurements PT);Wheelchair cushion (measurements PT);BSC/3in1       Precautions / Restrictions Precautions Precautions: Fall Precaution/Restrictions Comments: cortrak Restrictions Weight Bearing Restrictions Per Provider Order: No     Mobility  Bed Mobility Overal bed mobility: Needs Assistance Bed Mobility: Rolling, Supine to Sit, Sit to Supine Rolling: Used rails,  Min assist   Supine to sit: +2 for physical assistance, Min assist, +2 for safety/equipment, HOB elevated Sit to supine: Mod assist, +2 for physical assistance   General bed mobility comments: pt able to move LE's off EOB with multiple cues for sequencing, MinA to roll and MinAx2 for sidelying/sit for trunk elevation. ModAx2 to return to supine for LE management and trunk descent    Transfers    General transfer comment: deferred, pt fatigued and requested to return to supine after ~3 minutes seated EOB        Balance Overall balance assessment: Needs assistance Sitting-balance support: Feet unsupported, Bilateral upper extremity supported Sitting balance-Leahy Scale: Poor Sitting balance - Comments: MinA to CGA for balance, pt with forward lean Postural control: Other (comment) (forward)       Communication Communication Communication: Impaired Factors Affecting Communication: Reduced clarity of speech  Cognition Arousal: Alert Behavior During Therapy: Flat affect   PT - Cognitive impairments: Sequencing, Problem solving, Attention, Initiation          PT - Cognition Comments: difficulty initiating movement with need for repetitive tactile/verbal cues Following commands: Impaired Following commands impaired: Follows one step commands with increased time    Cueing Cueing Techniques: Verbal cues, Tactile cues, Gestural cues  Exercises General Exercises - Lower Extremity Ankle Circles/Pumps: AROM, Both, 5 reps, Supine Quad Sets: Both, 5 reps, Supine, Strengthening (x5 sec hold) Heel Slides: AAROM, Both, 5 reps, Supine Straight Leg Raises: AROM, Both, 5 reps, Supine (limited ROM)        Pertinent Vitals/Pain Pain Assessment Pain Assessment: No/denies pain     PT Goals (current  goals can now be found in the care plan section) Acute Rehab PT Goals PT Goal Formulation: With patient/family Time For Goal Achievement: 08/10/23 Potential to Achieve Goals: Good Progress  towards PT goals: Progressing toward goals    Frequency    Min 2X/week       AM-PAC PT "6 Clicks" Mobility   Outcome Measure  Help needed turning from your back to your side while in a flat bed without using bedrails?: A Little Help needed moving from lying on your back to sitting on the side of a flat bed without using bedrails?: A Lot Help needed moving to and from a bed to a chair (including a wheelchair)?: Total Help needed standing up from a chair using your arms (e.g., wheelchair or bedside chair)?: Total Help needed to walk in hospital room?: Total Help needed climbing 3-5 steps with a railing? : Total 6 Click Score: 9    End of Session   Activity Tolerance: Patient limited by fatigue Patient left: in bed;with call bell/phone within reach;with nursing/sitter in room Nurse Communication: Mobility status PT Visit Diagnosis: Unsteadiness on feet (R26.81);Muscle weakness (generalized) (M62.81);Difficulty in walking, not elsewhere classified (R26.2)     Time: 1308-6578 PT Time Calculation (min) (ACUTE ONLY): 16 min  Charges:    $Therapeutic Exercise: 8-22 mins PT General Charges $$ ACUTE PT VISIT: 1 Visit                     Hilton Cork, PT, DPT Secure Chat Preferred  Rehab Office [phone]   Arturo Morton Brion Aliment 07/30/2023, 10:14 AM

## 2023-07-30 NOTE — Progress Notes (Signed)
 CPT via flutter held per patient's request. States he would like to sleep. Pt's family member has been doing w/ patient throughout the night

## 2023-07-30 NOTE — Progress Notes (Addendum)
 Patient ID: JANDIEL MAGALLANES, male   DOB: 24-Apr-1986, 38 y.o.   MRN: [id number]     Advanced Heart Failure Rounding Note  Cardiologist: Christell Constant, MD  Chief Complaint: V-A ECMO  Subjective:   - Admitted 2/26 with worsening SOB, CP and a fib with RVR. - Decompensated 2/27 with IVF, nodal blockade with subsequent respiratory arrest, ROSC achieved after ~16 minute down time - Femoral VA ecmo cannulation 2/27 with Impella CP vent - ECMO decannulation 3/8 - Impella removed and extubated 3/12.  Had to be reintubated with mucus plugging and left lung opacification.  - MRI head 3/13 with small infarcts right cerebral and cerebellar hemispheres - Re-extubated 3/16  Stable off inotrope support. No central access. CRRT stopped 3/15. Now on iHD. Plan for iHD today.   He remains on mexiletine and amiodarone.  Remains in NSR.    More interactive this morning. Was able to answer a question. Denies CP/SOB.  Objective:   Echo (3/10): EF 35-40%, anterior and septal HK, normal RV, s/p Mitraclip with mild MR and mean gradient 7 mmHg.   Weight Range: 76.6 kg Body mass index is 20.56 kg/m.   Vital Signs:   Temp:  [98.2 F (36.8 C)-99.3 F (37.4 C)] 98.2 F (36.8 C) (03/19 0800) Pulse Rate:  [78-96] 82 (03/19 0915) Resp:  [0-36] 19 (03/19 0915) BP: (130-158)/(78-95) 140/83 (03/19 0915) SpO2:  [92 %-100 %] 98 % (03/19 0915) Weight:  [76.6 kg] 76.6 kg (03/19 0453) Last BM Date : 07/30/23  Weight change: Filed Weights   07/28/23 0328 07/28/23 0830 07/30/23 0453  Weight: 74 kg 74 kg 76.6 kg    Intake/Output:   Intake/Output Summary (Last 24 hours) at 07/30/2023 0946 Last data filed at 07/30/2023 0900 Gross per 24 hour  Intake 2693.93 ml  Output --  Net 2693.93 ml    Physical Exam  General:  weak appearing.  No respiratory difficulty. +cough HEENT: +cortrak Neck: supple. JVD flat. [facility] HD cath Cor: PMI nondisplaced. Regular rate & rhythm. No rubs, gallops or murmurs. Lungs:  clear Abdomen: soft, nontender, nondistended. Good bowel sounds. Extremities: no cyanosis, clubbing, rash, edema  Neuro: alert & oriented x 3. Moves all 4 extremities w/o difficulty. Affect pleasant.  Telemetry   NSR 80s (Personally reviewed)    Labs    CBC Recent Labs    07/28/23 0512 07/29/23 0427  WBC 18.4* 17.6*  HGB 7.6* 8.3*  HCT 24.2* 26.2*  MCV 94.9 94.2  PLT 307 357   Basic Metabolic Panel Recent Labs    78/29/56 0512 07/29/23 0427 07/30/23 0500  NA 133* 132* 131*  K 4.6 4.6 4.6  CL 103 97* 95*  CO2 18* 21* 19*  GLUCOSE 170* 151* 170*  BUN 116* 90* 130*  CREATININE 8.93* 7.20* 9.41*  CALCIUM 7.9* 7.7* 7.9*  MG 2.6* 2.3  --   PHOS 4.3 5.0* 5.3*   Liver Function Tests Recent Labs    07/29/23 0427 07/30/23 0500  AST 396* 60*  ALT 147* 72*  ALKPHOS 500* 282*  BILITOT 0.9 0.9  PROT 6.6 6.1*  ALBUMIN 2.2* 2.1*  2.1*   BNP: BNP (last 3 results) Recent Labs    07/09/23 1934 07/10/23 1224  BNP 703.7* 980.5*   Thyroid Function Tests No results for input(s): "TSH", "T4TOTAL", "T3FREE", "THYROIDAB" in the last 72 hours.  Invalid input(s): "FREET3"  Medications:   Scheduled Medications:  amiodarone  200 mg Oral BID   arformoterol  15 mcg Nebulization BID  ascorbic acid  250 mg Oral BID   aspirin  81 mg Oral Daily   budesonide (PULMICORT) nebulizer solution  0.25 mg Nebulization BID   Chlorhexidine Gluconate Cloth  6 each Topical Q0600   darbepoetin (ARANESP) injection - DIALYSIS  200 mcg Subcutaneous Q Fri-1800   feeding supplement (PROSource TF20)  60 mL Per Tube BID   fiber supplement (BANATROL TF)  60 mL Per Tube BID   folic acid  1 mg Oral Daily   insulin aspart  0-20 Units Subcutaneous Q4H   methimazole  10 mg Oral Daily   mexiletine  250 mg Oral Q12H   multivitamin  1 tablet Oral QHS   mouth rinse  15 mL Mouth Rinse 4 times per day   revefenacin  175 mcg Nebulization Daily   sodium chloride flush  3 mL Intravenous Q12H   thiamine   100 mg Per Tube Daily   Or   thiamine  100 mg Intravenous Daily   zinc sulfate (50mg  elemental zinc)  220 mg Oral Daily    Infusions:  anticoagulant sodium citrate     dexmedetomidine (PRECEDEX) IV infusion Stopped (07/28/23 1354)   feeding supplement (VITAL 1.5 CAL) 75 mL/hr at 07/30/23 0900   heparin 1,900 Units/hr (07/30/23 0900)    PRN Medications: acetaminophen, alteplase, anticoagulant sodium citrate, chlorpheniramine-HYDROcodone, heparin, heparin, HYDROmorphone (DILAUDID) injection, labetalol, lactulose, lidocaine (PF), lidocaine-prilocaine, [COMPLETED] loperamide HCl **FOLLOWED BY** loperamide HCl, ondansetron (ZOFRAN) IV, mouth rinse, oxyCODONE, pentafluoroprop-tetrafluoroeth, polyethylene glycol Patient Profile   Patient with a history of Grave's disease, severe primary mitral regurgitation who presented with chest pain and segmental PE. Subsequent progressed to SCAI Stage E shock requiring VA ECMO cannulation on 2/27.   Assessment/Plan  SCAI Stage E Cardiogenic shock - Suspect hypoxic respiratory failure driven with segmental PE on top of severe MR, nodal blockage, and thyrotoxicosis - Down time ~ 20 minutes, good mental status confirmed post code - We performed a mini-turn down on 07/15/23 at bedside; ECMO flows were reduced in a stepwise pattern by 0.5LPM to a goal of 2.5LPM. During this time, impella 5.5 flows increased to maintain net even flows. With reduction of ECMO flows, patient had onset of frequent PVCs/NSVT requiring amio boluses. In addition, despite increased LV unloading patient continued to have severe 4+ MR with progressive RV failure on bedside TTE.  - Underwent successful mTEER on 07/16/23 with placement of x3 clips and improvement in MR from severe to mild. Mean mitral gradient of 3-72mmHg.  - Decannulated from V-A ECMO by Dr. Karin Lieu on 07/19/23 with vascular cut down and primary repair of left femoral artery.  - Echo 3/10: EF 35-40%, anterior and septal HK, normal  RV, s/p Mitraclip with mild MR and mean gradient 7 mmHg.  - Impella removed 3/12.  - CVVH stopped 3/15. Nephrology managing. Now on iHD. Next round today.   Severe mitral valve regurgitation - Noted on previous echocardiogram - Now s/p mTEER on 07/16/23; improvement in MR from severe to mild.  - No murmur on exam  Pulmonary hypertension - PA pressures reached 80s/30s with PCWP of ~10, TPG of ~10mmHg suggestive of PVR >5 on 07/17/22 - He is now off NO, swan is out. Overall data is not supportive with the use of sildenafil in fixed PH secondary to MS/MR.   VT/NSVT - Initially with frequent runs of NSVT likely secondary to impella 5.5 position. Impella now out.  - Now on mexiletine and amiodarone per tube.   - No further VT.  ID - Now off vancomycin/meropenem/micafungin  - WBCs 22.8> 26> 30> 42> 47> 31> 16.7>21>18. TMax 99.3 overnight - Bronchoscopies with thick brown secretions but no culture data.    Anemia - Hgb 8.3 3/18, transfuse hgb < 7.   Acute renal failure/hyperkalemia - Nephrology following. Had iHD 3/17. - Plan to transition to Eliquis when long term HD access in place.   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   Atrial fibrillation - Present on arrival, in the setting of PE and thyroid disease - NSR today.  - Continue amio PO  Pulmonary embolism:  - Segmental without right heart strain - Heparin gtt.    CAD - Prior embolic infarct in the LAD territory with corresponding LGE on CMR - Lifelong anticoagulation - On heparin gtt here.   Acute hypoxemic respiratory failure - Extubated and reintubated 3/12.   - Thick secretions with complete opacification of left lung, now CXR improved with bronchoscopies and suction.   - Extubated 3/16 - Continue use of flutter valve/ IS  CVA - MRI head 3/13 with small infarcts right cerebral and  cerebellar hemispheres. This should not significantly affect consciousness.  - Suspect embolic, on heparin gtt.  - More interactive today  Stable for transfer for 2C. Consult CIR.   Length of Stay: 21  Alen Bleacher, NP  07/30/2023, 9:46 AM  Advanced Heart Failure Team Pager [phone] (M-F; 7a - 5p)  Please contact [facility] for night-coverage after hours (5p -7a ) and weekends on [email or web address]  Patient seen and examined with the above-signed Advanced Practice Provider and/or Housestaff. I personally reviewed laboratory data, imaging studies and relevant notes. I independently examined the patient and formulated the important aspects of the plan. I have edited the note to reflect any of my changes or salient points. I have personally discussed the plan with the patient and/or family.  More alert and interactive. Off CVVHD. Tolerating iHD now. Remains on TF Cardiac output stable   On heparin no bleeding  General:  Weak appearing. No resp difficulty HEENT: normal + Cor-trak Neck: supple. + HD cath Cor:  Regular rate & rhythm. 2/6 TR no MR Lungs: clear Abdomen: soft, nontender, nondistended. No hepatosplenomegaly. No bruits or masses. Good bowel sounds. Extremities: no cyanosis, clubbing, rash, edema Neuro: alert & orientedx3, weak moves all 4   Continues to improve. Now tolerating iHD. Rhythm stable. Continue heparin. Switch to eliquis once decision made on [facility].   Continue TFs.   Consult CIR. Can go to [facility].   Arvilla Meres, MD  5:20 PM

## 2023-07-31 ENCOUNTER — Inpatient Hospital Stay (HOSPITAL_COMMUNITY)

## 2023-07-31 DIAGNOSIS — R570 Cardiogenic shock: Secondary | ICD-10-CM | POA: Diagnosis not present

## 2023-07-31 DIAGNOSIS — Z4682 Encounter for fitting and adjustment of non-vascular catheter: Secondary | ICD-10-CM | POA: Diagnosis not present

## 2023-07-31 DIAGNOSIS — Z452 Encounter for adjustment and management of vascular access device: Secondary | ICD-10-CM | POA: Diagnosis not present

## 2023-07-31 DIAGNOSIS — R079 Chest pain, unspecified: Secondary | ICD-10-CM | POA: Diagnosis not present

## 2023-07-31 DIAGNOSIS — J9601 Acute respiratory failure with hypoxia: Secondary | ICD-10-CM | POA: Diagnosis not present

## 2023-07-31 DIAGNOSIS — R918 Other nonspecific abnormal finding of lung field: Secondary | ICD-10-CM | POA: Diagnosis not present

## 2023-07-31 DIAGNOSIS — I469 Cardiac arrest, cause unspecified: Secondary | ICD-10-CM | POA: Diagnosis not present

## 2023-07-31 DIAGNOSIS — I2699 Other pulmonary embolism without acute cor pulmonale: Secondary | ICD-10-CM | POA: Diagnosis not present

## 2023-07-31 DIAGNOSIS — N170 Acute kidney failure with tubular necrosis: Secondary | ICD-10-CM | POA: Diagnosis not present

## 2023-07-31 DIAGNOSIS — E872 Acidosis, unspecified: Secondary | ICD-10-CM | POA: Diagnosis not present

## 2023-07-31 DIAGNOSIS — J90 Pleural effusion, not elsewhere classified: Secondary | ICD-10-CM | POA: Diagnosis not present

## 2023-07-31 DIAGNOSIS — R0602 Shortness of breath: Secondary | ICD-10-CM | POA: Diagnosis not present

## 2023-07-31 DIAGNOSIS — E875 Hyperkalemia: Secondary | ICD-10-CM | POA: Diagnosis not present

## 2023-07-31 LAB — HEPARIN LEVEL (UNFRACTIONATED): Heparin Unfractionated: 0.39 [IU]/mL (ref 0.30–0.70)

## 2023-07-31 LAB — HEPATIC FUNCTION PANEL
ALT: 49 U/L — ABNORMAL HIGH (ref 0–44)
AST: 46 U/L — ABNORMAL HIGH (ref 15–41)
Albumin: 2.2 g/dL — ABNORMAL LOW (ref 3.5–5.0)
Alkaline Phosphatase: 243 U/L — ABNORMAL HIGH (ref 38–126)
Bilirubin, Direct: 0.2 mg/dL (ref 0.0–0.2)
Indirect Bilirubin: 0.5 mg/dL (ref 0.3–0.9)
Total Bilirubin: 0.7 mg/dL (ref 0.0–1.2)
Total Protein: 6.4 g/dL — ABNORMAL LOW (ref 6.5–8.1)

## 2023-07-31 LAB — GLUCOSE, CAPILLARY
Glucose-Capillary: 96 mg/dL (ref 70–99)
Glucose-Capillary: 170 mg/dL — ABNORMAL HIGH (ref 70–99)
Glucose-Capillary: 130 mg/dL — ABNORMAL HIGH (ref 70–99)
Glucose-Capillary: 186 mg/dL — ABNORMAL HIGH (ref 70–99)
Glucose-Capillary: 139 mg/dL — ABNORMAL HIGH (ref 70–99)

## 2023-07-31 MED ORDER — POLYETHYLENE GLYCOL 3350 17 G PO PACK: 17.0000 g | PACK | Freq: Every day | ORAL | Status: DC | PRN

## 2023-07-31 MED ORDER — AMIODARONE HCL 200 MG PO TABS
200.0000 mg | ORAL_TABLET | Freq: Every day | ORAL | Status: DC
  Administered 2023-07-31 – 2023-08-04 (×5): 200 mg via ORAL
  Filled 2023-07-31 (×5): qty 1

## 2023-07-31 MED ORDER — BANATROL TF EN LIQD
60.0000 mL | Freq: Four times a day (QID) | ENTERAL | Status: DC
  Administered 2023-07-31 (×2): 60 mL via ORAL
  Filled 2023-07-31 (×6): qty 60

## 2023-07-31 MED ORDER — THIAMINE MONONITRATE 100 MG PO TABS
100.0000 mg | ORAL_TABLET | Freq: Every day | ORAL | Status: DC
  Administered 2023-08-01 – 2023-08-04 (×3): 100 mg via ORAL
  Filled 2023-07-31 (×4): qty 1

## 2023-07-31 MED ORDER — LACTULOSE 10 GM/15ML PO SOLN: 30.0000 g | Freq: Two times a day (BID) | ORAL | Status: DC | PRN

## 2023-07-31 MED ORDER — LOPERAMIDE HCL 1 MG/7.5ML PO SUSP
2.0000 mg | Freq: Two times a day (BID) | ORAL | Status: DC | PRN
  Administered 2023-07-31: 2 mg via ORAL
  Filled 2023-07-31: qty 15

## 2023-07-31 MED ORDER — THIAMINE HCL 100 MG/ML IJ SOLN
100.0000 mg | Freq: Every day | INTRAMUSCULAR | Status: DC
  Filled 2023-07-31: qty 2

## 2023-07-31 NOTE — Progress Notes (Signed)
 PHARMACY - ANTICOAGULATION CONSULT NOTE  Pharmacy Consult for Heparin  Indication: pulmonary embolus, s/p ECMO decannulation 3/7, Impella removal 3/12  No Known Allergies  Patient Measurements: Height: 6\' 4"  (193 cm) Weight: 80 kg (176 lb 5.9 oz) IBW/kg (Calculated) : 86.8 Heparin Dosing Weight: n/a  Vital Signs: Temp: 98.8 F (37.1 C) (03/20 0400) Temp Source: Axillary (03/20 0400) BP: 126/77 (03/20 0630) Pulse Rate: 85 (03/20 0630)  Labs: Recent Labs    07/29/23 0427 07/30/23 0500 07/30/23 1430 07/31/23 0359  HGB 8.3*  --  8.6*  --   HCT 26.2*  --  27.1*  --   PLT 357  --  433*  --   HEPARINUNFRC 0.37 0.47  --  0.39  CREATININE 7.20* 9.41*  --   --     Estimated Creatinine Clearance: 12 mL/min (A) (by C-G formula based on SCr of 9.41 mg/dL (H)).   Medical History: Past Medical History:  Diagnosis Date   Atrial fibrillation [facility])    Hyperthyroidism    Mitral regurgitation    NSTEMI (non-ST elevated myocardial infarction) [facility])     Assessment: 38 yo M with bilateral PE s/p TNK (2/27 @1156 ) now on [place] ECMO + Impella. Pharmacy consulted for bivalirudin for anticoagulation.   S/p impella CP >5.5 3/3 - bivalirudin was previously held with oozing at insertion site but restart 3/4. Patient underwent mitraclip 3/5.  Bival transitioned to heparin 3/7 after decannulation.  Impella 5.5 removed 3/13. Heparin restarted 3/13. Hep in CRRT stopped 3/15 with CRRT stopping.   Heparin level came back therapeutic at 0.39, on 1900 units/hr. CBC stable 3/19, planning to begin apixaban once permanent HD line placed.  Goal of Therapy:  Heparin level 0.3-0.7 units/mL Monitor platelets by anticoagulation protocol: Yes   Plan:  Continue heparin infusion at 1900 units/hr Heparin level and CBC daily  Monitor s/sx of bleeding    Fredonia Highland, PharmD, BCPS, [facility] Clinical Pharmacist [phone] Please check AMION for all [facility] Pharmacy numbers 07/31/2023

## 2023-07-31 NOTE — Progress Notes (Signed)
 [facility] NEPHROLOGY PROGRESS NOTE  Assessment/ Plan: Pt is a 38 y.o. yo male  likely ATN after cardiac arrest, cardiogenic shock on ECMO ( decan 3/8) and Impella ( removed 3/12), AHRF/VDRF   # Dialysis dependent anuric AKI 2/2 ATN from cardiogenic shock on CRRT since 07/11/23, Left subclavian temp HD cath placed by CCM;  Tolerating CRRT well however getting interruptions because of procedures, imaging studies as well as clotting.  The potassium level was running high --  CRRT prescription was changed, then K in the 3's-  change again to 4 K pre and post and cont 2 K dialysate-  UF around 50 / hour-  volume does not seem terrible.   Clotting was an issue.  CRRT was stopped on 3/15.  No UOP and numbers rising.  Tolerated IHD 3/17; 3/19 (1.7L)  no signs of recovery and we will plan on next treatment today (Lt SCV temp), orders already written.   Bladder scan only on 3/17.  Will maintain on MWF regimen, daily evaluation for any signs of renal recovery. D/w RN and not making much urine.  Plan on HD Fri.  # Cardiogenic Shock,  Impella removed; status post transcutaneous mitral valve repair; previously on ECMO. per cardiology.  # VT + PEA SCA, as per cardiology team. # PE on heparin per CCM # Anemia, Hb  dropping--  added ESA, supportive care # Hyperkalemia, less present .  Off  CRRT 3/15 now on iHD. # Severe MR s/p TEER 3/5   Subjective:  Extubated, awake and appropriate; fianc not bedside today.  No urine output.recorded and bladder scan only 110 cc on 3/17.  CRRT stopped 3/15. Tolerated iHD on Wed. Some nausea but no cramping  Objective Vital signs in last 24 hours: Vitals:   07/31/23 0719 07/31/23 0720 07/31/23 0800 07/31/23 0900  BP:   136/87 130/82  Pulse:   89 83  Resp:   (!) 28 (!) 28  Temp:      TempSrc:      SpO2: 100% 100% 97% 96%  Weight:      Height:       Weight change: 0 kg  Intake/Output Summary (Last 24 hours) at 07/31/2023 1050 Last data  filed at 07/31/2023 0600 Gross per 24 hour  Intake 2348.55 ml  Output 1700 ml  Net 648.55 ml       Labs: RENAL PANEL Recent Labs  Lab 07/25/23 0208 07/25/23 0407 07/26/23 0437 07/26/23 0443 07/27/23 0500 07/27/23 1503 07/27/23 1608 07/28/23 0512 07/29/23 0427 07/30/23 0500 07/31/23 0359  NA 130*   < > 134*  133*   < > 137  133* 134* 135 133* 132* 131*  --   K 3.8   < > 3.9  3.8   < > 4.4  4.3 4.4 4.5 4.6 4.6 4.6  --   CL 97*   < > 100  101   < > 102  102 102  --  103 97* 95*  --   CO2 20*   < > 21*  22   < > 17*  20* 16*  --  18* 21* 19*  --   GLUCOSE 112*   < > 150*  153*   < > 132*  136* 148*  --  170* 151* 170*  --   BUN 54*   < > 47*  46*   < > 75*  75* 90*  --  116* 90* 130*  --   CREATININE 3.39*   < >  3.39*  3.24*   < > 5.69*  5.80* 7.40*  --  8.93* 7.20* 9.41*  --   CALCIUM 8.0*   < > 8.2*  8.0*   < > 8.0*  7.8* 7.9*  --  7.9* 7.7* 7.9*  --   MG 2.3  --  2.2  --  2.3  --   --  2.6* 2.3  --   --   PHOS 4.1   < > 3.8  3.8   < > 4.5  4.5 4.1  --  4.3 5.0* 5.3*  --   ALBUMIN 2.0*   < > 2.2*  2.1*   < > 2.2*  2.1* 2.1*  --  2.1*  2.1* 2.2* 2.1*  2.1* 2.2*   < > = values in this interval not displayed.    Liver Function Tests: Recent Labs  Lab 07/29/23 0427 07/30/23 0500 07/31/23 0359  AST 396* 60* 46*  ALT 147* 72* 49*  ALKPHOS 500* 282* 243*  BILITOT 0.9 0.9 0.7  PROT 6.6 6.1* 6.4*  ALBUMIN 2.2* 2.1*  2.1* 2.2*   No results for input(s): "LIPASE", "AMYLASE" in the last 168 hours. Recent Labs  Lab 07/24/23 1155  AMMONIA 63*   CBC: Recent Labs    07/27/23 0500 07/27/23 1608 07/28/23 0512 07/29/23 0427 07/30/23 1430  HGB 7.6* 11.9* 7.6* 8.3* 8.6*  MCV 96.0  --  94.9 94.2 94.8    Cardiac Enzymes: No results for input(s): "CKTOTAL", "CKMB", "CKMBINDEX", "TROPONINI" in the last 168 hours. CBG: Recent Labs  Lab 07/30/23 1849 07/30/23 2000 07/30/23 2325 07/31/23 0345 07/31/23 0733  GLUCAP 182* 186* 121* 96 170*     Iron Studies: No results for input(s): "IRON", "TIBC", "TRANSFERRIN", "FERRITIN" in the last 72 hours. Studies/Results: DG Abd 1 View Result Date: 07/31/2023 CLINICAL DATA:  Feeding tube placement. EXAM: ABDOMEN - 1 VIEW COMPARISON:  Abdominal radiograph dated 07/24/2023. FINDINGS: Feeding tube with tip in the region of the distal stomach. IMPRESSION: Feeding tube with tip in the distal stomach. Electronically Signed   By: Elgie Collard M.D.   On: 07/31/2023 09:37   DG CHEST PORT 1 VIEW Result Date: 07/31/2023 CLINICAL DATA:  Dialysis catheter dysfunction. EXAM: PORTABLE CHEST 1 VIEW COMPARISON:  Chest radiograph dated 07/25/2023. FINDINGS: Interval removal of the right-sided PICC and left IJ central venous line. Left subclavian catheter with tip over upper SVC. Feeding tube extends below the diaphragm with tip beyond the inferior margin of the image. No significant interval change in bilateral pulmonary opacities and small left pleural effusion. No pneumothorax. Stable cardiac silhouette. No acute osseous pathology. IMPRESSION: 1. Interval removal of the right-sided PICC and left IJ central venous line. 2. No significant interval change in bilateral pulmonary opacities and small left pleural effusion. Electronically Signed   By: Elgie Collard M.D.   On: 07/31/2023 09:37   DG Swallowing Func-Speech Pathology Result Date: 07/29/2023 Table formatting from the original result was not included. Modified Barium Swallow Study Patient Details Name: Aaron [name] MRN: [id number] Date of Birth: Apr 23, 1986 Today's Date: 07/29/2023 HPI/PMH: HPI: Aaron [name] is a 38 yo male presenting to ED 2/26 with chest pain and dyspnea. Admitted with bilateral lower lobe pulmonary emboli. VT/VF cardiac arrest 2/27, requiring 40 minutes of CPR and multiple shocks. Impella placed 2/27-3/12. ECMO initiated 2/27-3/8. CRRT stopped 3/15. ETT 2/27-3/12, reintubated 3/12-3/16 s/p mucus plugging and L lung opacification. MRI 3/13  shows small infarcts in the R cerebral and cerebellar hemispheres without generalized finding  to indicate a global anoxic injury. PMH includes hypothyroidism, paroxysmal A-fib, severe mitral regurgitation Clinical Impression: Pt exhibits severe pharyngeal dysphagia with concern for glottal insufficiency after prolonged intubation. He remains aphonic with a huff-like cough that is ineffective at clearing his airway. Thin and nectar thick liquids initially reached the level of the vocal folds before progressing further and being silently aspirated (PAS 8). A L and R head turn resulted in gross silent aspiration. Pt denies the urge to cough and his cued cough lacks crispness and strength. Recommend Dys 3 solids with honey thick liquids via cup or spoon and full supervision. Given difference in presentation when pt performed head turns in addition to aphonia, recommend ENT consult to assess vocal fold function. SLP will continue following. DIGEST Swallow Severity Rating*  Safety: 4  Efficiency: 1  Overall Pharyngeal Swallow Severity: 3 (severe) 1: mild; 2: moderate; 3: severe; 4: profound *The Dynamic Imaging Grade of Swallowing Toxicity is standardized for the head and neck cancer population, however, demonstrates promising clinical applications across populations to standardize the clinical rating of pharyngeal swallow safety and severity. Factors that may increase risk of adverse event in presence of aspiration [name]: Factors that may increase risk of adverse event in presence of aspiration [name]: Poor general health and/or compromised immunity; Frail or deconditioned; Presence of tubes (ETT, trach, NG, etc.) Recommendations/Plan: Swallowing Evaluation Recommendations Swallowing Evaluation Recommendations Recommendations: PO diet PO Diet Recommendation: Dysphagia 3 (Mechanical soft); Moderately thick liquids (Level 3, honey thick) Liquid Administration via: Spoon; Cup Medication  Administration: Crushed with puree Supervision: Staff to assist with self-feeding; Full supervision/cueing for swallowing strategies Swallowing strategies  : Minimize environmental distractions; Slow rate; Small bites/sips Postural changes: Position pt fully upright for meals; Stay upright 30-60 min after meals Oral care recommendations: Oral care QID (4x/day); Oral care before PO; Use suctioning for oral care Recommended consults: Consider ENT consultation Caregiver Recommendations: Avoid jello, ice cream, thin soups, popsicles; Remove water pitcher Treatment Plan Treatment Plan Treatment recommendations: Therapy as outlined in treatment plan below Follow-up recommendations: Acute inpatient rehab (3 hours/day) Functional status assessment: Patient has had a recent decline in their functional status and demonstrates the ability to make significant improvements in function in a reasonable and predictable amount of time. Treatment frequency: Min 2x/week Treatment duration: 2 weeks Interventions: Aspiration precaution training; Compensatory techniques; Patient/family education; Trials of upgraded texture/liquids; Diet toleration management by SLP; Respiratory muscle strength training Recommendations Recommendations for follow up therapy are one component of a multi-disciplinary discharge planning process, led by the attending physician.  Recommendations may be updated based on patient status, additional functional criteria and insurance authorization. Assessment: Orofacial Exam: Orofacial Exam Oral Cavity: Oral Hygiene: WFL Oral Cavity - Dentition: Adequate natural dentition Orofacial Anatomy: WFL Oral Motor/Sensory Function: WFL Anatomy: Anatomy: WFL Boluses Administered: Boluses Administered Boluses Administered: Thin liquids (Level 0); Mildly thick liquids (Level 2, nectar thick); Moderately thick liquids (Level 3, honey thick); Puree; Solid  Oral Impairment Domain: Oral Impairment Domain Lip Closure: Interlabial  escape, no progression to anterior lip Tongue control during bolus hold: Cohesive bolus between tongue to palatal seal Bolus preparation/mastication: Timely and efficient chewing and mashing Bolus transport/lingual motion: Brisk tongue motion Oral residue: Complete oral clearance Location of oral residue : N/A Initiation of pharyngeal swallow : Posterior laryngeal surface of the epiglottis  Pharyngeal Impairment Domain: Pharyngeal Impairment Domain Soft palate elevation: No bolus between soft palate (SP)/pharyngeal wall (PW) Laryngeal elevation: Complete superior movement of thyroid cartilage  with complete approximation of arytenoids to epiglottic petiole Anterior hyoid excursion: Complete anterior movement Epiglottic movement: Complete inversion Laryngeal vestibule closure: Complete, no air/contrast in laryngeal vestibule Pharyngeal stripping wave : Present - complete Pharyngeal contraction (A/P view only): N/A Pharyngoesophageal segment opening: Complete distension and complete duration, no obstruction of flow Tongue base retraction: Trace column of contrast or air between tongue base and PPW Pharyngeal residue: Trace residue within or on pharyngeal structures Location of pharyngeal residue: Valleculae; Pyriform sinuses  Esophageal Impairment Domain: No data recorded Pill: No data recorded Penetration/Aspiration Scale Score: Penetration/Aspiration Scale Score 1.  Material does not enter airway: Moderately thick liquids (Level 3, honey thick); Puree; Solid 5.  Material enters airway, CONTACTS cords and not ejected out: Mildly thick liquids (Level 2, nectar thick) 8.  Material enters airway, passes BELOW cords without attempt by patient to eject out (silent aspiration) : Thin liquids (Level 0) Compensatory Strategies: Compensatory Strategies Compensatory strategies: Yes Straw: Ineffective Ineffective Straw: Thin liquid (Level 0); Mildly thick liquid (Level 2, nectar thick) Chin tuck: Effective; Ineffective Effective  Chin Tuck: Mildly thick liquid (Level 2, nectar thick) Ineffective Chin Tuck: Thin liquid (Level 0) Left head turn: Ineffective Ineffective Left Head Turn: Thin liquid (Level 0) Right head turn: Ineffective Ineffective Right Head Turn: Thin liquid (Level 0)   General Information: Caregiver present: No  Diet Prior to this Study: NPO; Cortrak/Small bore NG tube   Temperature : Normal   Respiratory Status: WFL   Supplemental O2: Nasal cannula   History of Recent Intubation: Yes  Behavior/Cognition: Alert; Cooperative; Requires cueing Self-Feeding Abilities: Dependent for feeding Baseline vocal quality/speech: Aphonic Volitional Cough: Able to elicit Volitional Swallow: Able to elicit Exam Limitations: No limitations Goal Planning: Prognosis for improved oropharyngeal function: Good Barriers to Reach Goals: Cognitive deficits; Time post onset; Severity of deficits No data recorded Patient/Family Stated Goal: none stated Consulted and agree with results and recommendations: Patient; Nurse Pain: Pain Assessment Pain Assessment: No/denies pain Faces Pain Scale: 0 End of Session: Start Time:SLP Start Time (ACUTE ONLY): 1255 Stop Time: SLP Stop Time (ACUTE ONLY): 1316 Time Calculation:SLP Time Calculation (min) (ACUTE ONLY): 21 min Charges: SLP Evaluations $ SLP Speech Visit: 1 Visit SLP Evaluations $BSS Swallow: 1 Procedure $MBS Swallow: 1 Procedure SLP visit diagnosis: SLP Visit Diagnosis: Dysphagia, pharyngeal phase (R13.13) Past Medical History: Past Medical History: Diagnosis Date  Atrial fibrillation (HCC)   Hyperthyroidism   Mitral regurgitation   NSTEMI (non-ST elevated myocardial infarction) [facility])  Past Surgical History: Past Surgical History: Procedure Laterality Date  ECMO CANNULATION N/A 07/10/2023  Procedure: ECMO CANNULATION;  Surgeon: Dolores Patty, MD;  Location: [facility] INVASIVE CV LAB;  Service: Cardiovascular;  Laterality: N/A;  EMBOLECTOMY Left 07/18/2023  Procedure: EMBOLECTOMY Left Femoral, Popliteal and  iliac arterys,  Repair Left common femoral artery.;  Surgeon: Victorino Sparrow, MD;  Location: [facility] OR;  Service: Vascular;  Laterality: Left;  INTRAOPERATIVE TRANSESOPHAGEAL ECHOCARDIOGRAM N/A 07/23/2023  Procedure: ECHOCARDIOGRAM, TRANSESOPHAGEAL, INTRAOPERATIVE;  Surgeon: Loreli Slot, MD;  Location: [facility] OR;  Service: Open Heart Surgery;  Laterality: N/A;  LEFT HEART CATH AND CORONARY ANGIOGRAPHY N/A 10/22/2021  Procedure: LEFT HEART CATH AND CORONARY ANGIOGRAPHY;  Surgeon: Yvonne Kendall, MD;  Location: [facility] INVASIVE CV LAB;  Service: Cardiovascular;  Laterality: N/A;  PLACEMENT OF IMPELLA LEFT VENTRICULAR ASSIST DEVICE Right 07/14/2023  Procedure: PLACEMENT OF RIGHT AXILLARY IMPELLA 5.5 LEFT VENTRICULAR ASSIST DEVICE;  Surgeon: Loreli Slot, MD;  Location: [facility] OR;  Service: Open Heart Surgery;  Laterality: Right;  REMOVAL OF IMPELLA LEFT VENTRICULAR ASSIST DEVICE Right 07/14/2023  Procedure: REMOVAL OF CP RIGHT FEMORAL IMPELLA LEFT VENTRICULAR ASSIST DEVICE;  Surgeon: Loreli Slot, MD;  Location: [facility] OR;  Service: Open Heart Surgery;  Laterality: Right;  REMOVAL OF IMPELLA LEFT VENTRICULAR ASSIST DEVICE N/A 07/23/2023  Procedure: REMOVAL, CARDIAC ASSIST DEVICE, IMPELLA;  Surgeon: Loreli Slot, MD;  Location: [facility] OR;  Service: Open Heart Surgery;  Laterality: N/A;  TRANSCATHETER MITRAL EDGE TO EDGE REPAIR N/A 07/16/2023  Procedure: TRANSCATHETER MITRAL EDGE TO EDGE REPAIR;  Surgeon: Tonny Bollman, MD;  Location: [facility] INVASIVE CV LAB;  Service: Cardiovascular;  Laterality: N/A;  TRANSESOPHAGEAL ECHOCARDIOGRAM (CATH LAB) N/A 07/16/2023  Procedure: TRANSESOPHAGEAL ECHOCARDIOGRAM;  Surgeon: Tonny Bollman, MD;  Location: [facility] INVASIVE CV LAB;  Service: Cardiovascular;  Laterality: N/A;  VENTRICULAR ASSIST DEVICE INSERTION N/A 07/10/2023  Procedure: VENTRICULAR ASSIST DEVICE INSERTION;  Surgeon: Dolores Patty, MD;  Location: [facility] INVASIVE CV LAB;  Service: Cardiovascular;  Laterality: N/A; Gwynneth Aliment, M.A., CF-SLP Speech Language Pathology, Acute Rehabilitation Services Secure Chat preferred [phone] 07/29/2023, 1:49 PM    Medications: Infusions:  dexmedetomidine (PRECEDEX) IV infusion Stopped (07/28/23 1354)   feeding supplement (VITAL 1.5 CAL) 75 mL/hr at 07/31/23 0600   heparin 1,900 Units/hr (07/31/23 1011)    Scheduled Medications:  amiodarone  200 mg Oral Daily   arformoterol  15 mcg Nebulization BID   ascorbic acid  250 mg Oral BID   aspirin  81 mg Oral Daily   budesonide (PULMICORT) nebulizer solution  0.25 mg Nebulization BID   Chlorhexidine Gluconate Cloth  6 each Topical Q0600   darbepoetin (ARANESP) injection - DIALYSIS  200 mcg Subcutaneous Q Fri-1800   feeding supplement (PROSource TF20)  60 mL Per Tube BID   fiber supplement (BANATROL TF)  60 mL Oral QID   folic acid  1 mg Oral Daily   insulin aspart  0-20 Units Subcutaneous Q4H   methimazole  10 mg Oral Daily   mexiletine  250 mg Oral Q12H   multivitamin  1 tablet Oral QHS   mouth rinse  15 mL Mouth Rinse 4 times per day   revefenacin  175 mcg Nebulization Daily   sodium chloride flush  3 mL Intravenous Q12H   [START ON 08/01/2023] thiamine  100 mg Oral Daily   Or   [START ON 08/01/2023] thiamine  100 mg Intravenous Daily   zinc sulfate (50mg  elemental zinc)  220 mg Oral Daily    have reviewed scheduled and prn medications.  Physical Exam: General: Critically ill looking male, alert  Heart:RRR, s1s2 nl Lungs: rhonchi. Abdomen:soft, non-distended Extremities:No edema Dialysis Access: Left subclavian temporary HD line.-  newest line placed 3/9  Tag Wurtz W 07/31/2023,10:50 AM  LOS: 22 days

## 2023-07-31 NOTE — Progress Notes (Signed)
 Occupational Therapy Treatment Patient Details Name: Aaron [name] MRN: [id number] DOB: 1985-06-18 Today's Date: 07/31/2023   History of present illness Aaron [name] is a 38 y.o. male who presented to the ED on 2/26 for evaluation of chest pain and dyspnea. CTA revealed semental B lower lobe PE.  2/27 had VT/VF arrest, requiring of CPR and multiple shocks. Place on VA ECMO with impella CP. Impella removed 3/12, extubated 3/16. acute R hemisphere strokes on MRI 3/13  PMH:  PAF not adherent to Eliquis, chronic HFpEF, severe mitral regurgitation, hyperthyroidism   OT comments  Pt making good progress towards OT goals with improvements noted in sitting balance EOB today. Pt able to progress OOB to chair and BSC with Min A x 1-2 for transfers d/t balance deficits. Pt demonstrating ability to assist with LB ADLs though continues to require significant assistance from baseline. Pt left up in chair, alarm active and setup to play Xbox on OT exit. Continue to recommend intensive rehab services at [place].      If plan is discharge home, recommend the following:  A lot of help with walking and/or transfers;Two people to help with walking and/or transfers;A lot of help with bathing/dressing/bathroom   Equipment Recommendations  Other (comment) (TBD)    Recommendations for Other Services Rehab consult    Precautions / Restrictions Precautions Precautions: Fall Precaution/Restrictions Comments: cortrak Restrictions Weight Bearing Restrictions Per Provider Order: No       Mobility Bed Mobility Overal bed mobility: Needs Assistance Bed Mobility: Supine to Sit     Supine to sit: Min assist, HOB elevated, Used rails     General bed mobility comments: quick movements of LE out of bed. Min A to lift trunk/steady but close to CGA    Transfers Overall transfer level: Needs assistance Equipment used: 2 person hand held assist Transfers: Sit to/from Stand, Bed to chair/wheelchair/BSC Sit to  Stand: Min assist, +2 physical assistance, +2 safety/equipment     Step pivot transfers: Min assist, +2 physical assistance, +2 safety/equipment     General transfer comment: Min A x 2 to stand with handheld assist from bedside, recliner and BSC. Min A x 2 for pivoting but improving to MIn A x 1     Balance Overall balance assessment: Needs assistance Sitting-balance support: Feet unsupported, Bilateral upper extremity supported Sitting balance-Leahy Scale: Fair     Standing balance support: Bilateral upper extremity supported, Single extremity supported, During functional activity Standing balance-Leahy Scale: Poor                             ADL either performed or assessed with clinical judgement   ADL Overall ADL's : Needs assistance/impaired                     Lower Body Dressing: Moderate assistance;Sitting/lateral leans;Sit to/from stand Lower Body Dressing Details (indicate cue type and reason): pt able to reach down to B feet to pull up socks EOB without LOB today. will need assist to completely don socks or manage other clothing Toilet Transfer: Minimal assistance;+2 for physical assistance;+2 for safety/equipment;Stand-pivot;Cueing for sequencing;BSC/3in1 Toilet Transfer Details (indicate cue type and reason): Min A x 2 to pivot to [facility] from recliner. improved to Min A x 1 for transfer back to recliner after using [facility] Toileting- Clothing Manipulation and Hygiene: Total assistance;Sit to/from stand Toileting - Clothing Manipulation Details (indicate cue type and reason): Total A in standing  for hygiene. OT assisting with balance while nursing assisting with hygiene            Extremity/Trunk Assessment Upper Extremity Assessment Upper Extremity Assessment: Generalized weakness;Right hand dominant RUE Deficits / Details: improving edema and hand strength. able to use xbox controller during session RUE Coordination: decreased fine motor;decreased  gross motor LUE Coordination: decreased fine motor;decreased gross motor   Lower Extremity Assessment Lower Extremity Assessment: Defer to PT evaluation        Vision   Vision Assessment?: No apparent visual deficits   Perception     Praxis     Communication Communication Communication: Impaired Factors Affecting Communication: Reduced clarity of speech   Cognition Arousal: Alert Behavior During Therapy: Flat affect, WFL for tasks assessed/performed Cognition: Cognition impaired Difficult to assess due to: Impaired communication   Awareness: Intellectual awareness intact, Online awareness impaired Memory impairment (select all impairments): Short-term memory, Declarative long-term memory Attention impairment (select first level of impairment): Sustained attention, Selective attention Executive functioning impairment (select all impairments): Problem solving OT - Cognition Comments: following consistent commands, able to express needs to use bathroom. answering PLOF questions appropriately. some cues for safety and pacing needed. noted attention deficits when multiple staff members in room                 Following commands: Intact Following commands impaired: Follows one step commands with increased time      Cueing   Cueing Techniques: Verbal cues, Tactile cues, Gestural cues  Exercises      Shoulder Instructions       General Comments VSS on RA    Pertinent Vitals/ Pain       Pain Assessment Pain Assessment: No/denies pain  Home Living                                          Prior Functioning/Environment              Frequency  Min 1X/week        Progress Toward Goals  OT Goals(current goals can now be found in the care plan section)  Progress towards OT goals: Progressing toward goals  Acute Rehab OT Goals Patient Stated Goal: agreeable for OOB to chair OT Goal Formulation: With patient Time For Goal Achievement:  08/12/23 Potential to Achieve Goals: Good ADL Goals Pt Will Perform Grooming: with set-up;sitting Pt Will Perform Upper Body Bathing: with set-up;sitting Pt Will Perform Lower Body Bathing: with mod assist;sitting/lateral leans;sit to/from stand Pt Will Transfer to Toilet: with mod assist;stand pivot transfer;bedside commode Pt/caregiver will Perform Home Exercise Program: Increased strength;Both right and left upper extremity;With theraband;With Supervision;With written HEP provided  Plan      Co-evaluation                 AM-PAC OT "6 Clicks" Daily Activity     Outcome Measure   Help from another person eating meals?: A Lot Help from another person taking care of personal grooming?: A Little Help from another person toileting, which includes using toliet, bedpan, or urinal?: Total Help from another person bathing (including washing, rinsing, drying)?: A Lot Help from another person to put on and taking off regular upper body clothing?: A Lot Help from another person to put on and taking off regular lower body clothing?: A Lot 6 Click Score: 12    End of Session Equipment Utilized  During Treatment: Gait belt  OT Visit Diagnosis: Unsteadiness on feet (R26.81);Other abnormalities of gait and mobility (R26.89);Muscle weakness (generalized) (M62.81)   Activity Tolerance Patient tolerated treatment well   Patient Left in chair;with call bell/phone within reach;with chair alarm set;with nursing/sitter in room   Nurse Communication Mobility status        Time: 1030-1110 OT Time Calculation (min): 40 min  Charges: OT General Charges $OT Visit: 1 Visit OT Treatments $Self Care/Home Management : 23-37 mins $Therapeutic Activity: 8-22 mins  Bradd Canary, OTR/L Acute Rehab Services Office: [phone]   Lorre Munroe 07/31/2023, 12:53 PM

## 2023-07-31 NOTE — PMR Pre-admission (Signed)
 PMR Admission Coordinator Pre-Admission Assessment  Patient: Aaron [name] is an 38 y.o., male MRN: [id number] DOB: [birth year] Height: 6\' 4"  (193 cm) Weight: 87.9 kg              Insurance Information HMO:     PPO:      PCP:      IPA:      80/20:      OTHER:  PRIMARY: [place] Medicaid [facility]      Policy#: [id number]      Subscriber: patient CM Name: Christen Butter      Phone#: [phone]     Fax#: [phone] Pre-Cert#: [id number] (will change after determination rendered)   AUTH #: [id number]   Received approval on 08/01/23. Pat approved for 7 days beginning 08/01/23-08/08/23. Employer: Works sometimes as a Insurance account manager:  Phone #: [phone]     Name:  Eff. Date: 04/13/23     Deduct: $0      Out of Pocket Max: $0      Life Max: n/a  CIR: 100%      SNF: 100% Outpatient:       Co-Pay: $4/visit Home Health: 100%      Co-Pay: none DME: 100%     Co-Pay: none Providers: in network Moses Tressie Ellis [facility] provider ID is [phone]  SECONDARY:       Policy#:       Phone#:   Artist:       Phone#:   The Engineer, materials Information Summary" for patients in Inpatient Rehabilitation Facilities with attached "Privacy Act Statement-Health Care Records" was provided and verbally reviewed with: Patient  Emergency Contact Information Contact Information     Name Relation Home Work Mobile   [place] Sister [phone]     Trice,[name] Significant other   [phone]      Other Contacts   None on File    Current Medical History  Patient Admitting Diagnosis: R CVA, cardiogenic shock, cardiac arrest  History of Present Illness: A 38 y.o. male who presented to the ED At [facility] initially on 07/09/23 for evaluation of chest pain and dyspnea. Was later transferred to [facility].  CTA revealed semental B lower lobe PE. On 07/10/23 had VT/VF arrest, requiring of CPR and multiple shocks. Placed on VA ECMO with impella CP. Impella removed 07/23/23, extubated 3/16. acute Right  hemisphere strokes on MRI 3/13.   PMH: PAF not adherent to Eliquis, chronic HFpEF, severe mitral regurgitation, hyperthyroidism    Glasgow Coma Scale Score: 15  Patient's medical record from [facility] has been reviewed by the rehabilitation admission coordinator and physician.  Past Medical History  Past Medical History:  Diagnosis Date   Atrial fibrillation (HCC)    Hyperthyroidism    Mitral regurgitation    NSTEMI (non-ST elevated myocardial infarction) [facility])     Has the patient had major surgery during 100 days prior to admission? Yes  Family History  family history includes Heart disease in an other family member.   Current Medications   Current Facility-Administered Medications:    acetaminophen (TYLENOL) tablet 650 mg, 650 mg, Oral, Q6H PRN, Marinda Elk, MD, 650 mg at 07/31/23 1955   amiodarone (PACERONE) tablet 200 mg, 200 mg, Oral, Daily, Brynda Peon L, NP, 200 mg at 08/04/23 1209   arformoterol (BROVANA) nebulizer solution 15 mcg, 15 mcg, Nebulization, BID, Lorin Glass, MD, 15 mcg at 08/03/23 2003   ascorbic acid (VITAMIN C) tablet 250 mg, 250 mg, Oral,  BID, Lorin Glass, MD, 250 mg at 08/04/23 1209   aspirin chewable tablet 81 mg, 81 mg, Oral, Daily, Lorin Glass, MD, 81 mg at 08/04/23 1209   budesonide (PULMICORT) nebulizer solution 0.25 mg, 0.25 mg, Nebulization, BID, Lorin Glass, MD, 0.25 mg at 08/03/23 2003   Chlorhexidine Gluconate Cloth 2 % PADS 6 each, 6 each, Topical, Q0600, Annie Sable, MD, 6 each at 08/04/23 0435   chlorpheniramine-HYDROcodone (TUSSIONEX) 10-8 MG/5ML suspension 5 mL, 5 mL, Oral, QHS PRN, Lorin Glass, MD   Darbepoetin Alfa (ARANESP) injection 200 mcg, 200 mcg, Subcutaneous, Q Fri-1800, Annie Sable, MD, 200 mcg at 08/01/23 1820   folic acid (FOLVITE) tablet 1 mg, 1 mg, Oral, Daily, Lorin Glass, MD, 1 mg at 08/04/23 1209   Gerhardt's butt cream, , Topical, Daily PRN, Marinda Elk,  MD   heparin ADULT infusion 100 units/mL (25000 units/242mL), 1,900 Units/hr, Intravenous, Continuous, Arabella Merles, [facility], Last Rate: 19 mL/hr at 08/04/23 0806, 1,900 Units/hr at 08/04/23 0806   HYDROmorphone (DILAUDID) injection 1 mg, 1 mg, Intravenous, Q4H PRN, Selmer Dominion B, NP   insulin aspart (novoLOG) injection 0-20 Units, 0-20 Units, Subcutaneous, TID WC, Gherghe, Daylene Katayama, MD   labetalol (NORMODYNE) injection 10 mg, 10 mg, Intravenous, Q2H PRN, Paliwal, Aditya, MD, 10 mg at 07/26/23 0816   lactulose (CHRONULAC) 10 GM/15ML solution 30 g, 30 g, Oral, BID PRN, Leatha Gilding, MD   [COMPLETED] loperamide HCl (IMODIUM) 1 MG/7.5ML suspension 4 mg, 4 mg, Per Tube, Once, 4 mg at 07/28/23 1341 **FOLLOWED BY** loperamide HCl (IMODIUM) 1 MG/7.5ML suspension 2 mg, 2 mg, Oral, BID PRN, Leatha Gilding, MD, 2 mg at 07/31/23 1957   methimazole (TAPAZOLE) tablet 10 mg, 10 mg, Oral, Daily, Lorin Glass, MD, 10 mg at 08/04/23 1209   multivitamin (RENA-VIT) tablet 1 tablet, 1 tablet, Oral, QHS, Lorin Glass, MD, 1 tablet at 08/03/23 2134   ondansetron [facility]) injection 4 mg, 4 mg, Intravenous, Q6H PRN, Lorin Glass, MD, 4 mg at 08/04/23 1213   oxyCODONE (Oxy IR/ROXICODONE) immediate release tablet 5 mg, 5 mg, Per Tube, Q6H PRN, Lorin Glass, MD   polyethylene glycol (MIRALAX / GLYCOLAX) packet 17 g, 17 g, Oral, Daily PRN, Elvera Lennox, Daylene Katayama, MD   revefenacin (YUPELRI) nebulizer solution 175 mcg, 175 mcg, Nebulization, Daily, Lorin Glass, MD, 175 mcg at 08/03/23 1610   sodium chloride flush (NS) 0.9 % injection 3 mL, 3 mL, Intravenous, Q12H, Gold, Wayne E, PA-C, 3 mL at 08/04/23 1221   thiamine (VITAMIN B1) tablet 100 mg, 100 mg, Oral, Daily, 100 mg at 08/04/23 1208 **OR** thiamine (VITAMIN B1) injection 100 mg, 100 mg, Intravenous, Daily, Gherghe, Costin M, MD   zinc sulfate (50mg  elemental zinc) capsule 220 mg, 220 mg, Oral, Daily, Lorin Glass, MD, 220 mg at 08/04/23 1209  Patients  Current Diet:  Diet Order             Diet renal with fluid restriction Fluid restriction: 1500 mL Fluid; Room service appropriate? Yes; Fluid consistency: Thin  Diet effective now                   Precautions / Restrictions Precautions Precautions: Fall Precaution/Restrictions Comments: cortrak Restrictions Weight Bearing Restrictions Per Provider Order: No RLE Weight Bearing Per Provider Order: Non weight bearing LLE Weight Bearing Per Provider Order: Non weight bearing   Has the patient had 2 or more falls or a fall with injury  in the past year?No  Prior Activity Level Limited Community (1-2x/wk): Went out 3 times a week, was driving, worked sometimes as a Programmer, systems Level Prior Function Prior Level of Function : Independent/Modified Independent, Working/employed, 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  Independent  Indoor Mobility: Did the patient need assistance with walking from room to room (with or without device)? Independent  Stairs: Did the patient need assistance with internal or external stairs (with or without device)? Independent  Functional Cognition: Did the patient need help planning regular tasks such as shopping or remembering to take medications? Independent  Patient Information Are you of Hispanic, Latino/a,or Spanish origin?: A. No, not of Hispanic, Latino/a, or Spanish origin What is your race?: B. Black or African American Do you need or want an interpreter to communicate with a doctor or health care staff?: 0. No  Patient's Response To:  Health Literacy and Transportation Is the patient able to respond to health literacy and transportation needs?: Yes Health Literacy - How often do you need to have someone help you when you read instructions, pamphlets, or other written material from your doctor or pharmacy?:  Never In the past 12 months, has lack of transportation kept you from medical appointments or from getting medications?: No In the past 12 months, has lack of transportation kept you from meetings, work, or from getting things needed for daily living?: No  Home Assistive Devices / Equipment Home Equipment: None  Prior Device Use: Indicate devices/aids used by the patient prior to current illness, exacerbation or injury? None of the above  Current Functional Level Cognition  Arousal/Alertness: Awake/alert Overall Cognitive Status: Impaired/Different from baseline Orientation Level: Oriented X4 Attention: Sustained Sustained Attention: Appears intact Memory: Impaired Memory Impairment: Retrieval deficit, Decreased recall of new information Awareness: Impaired Awareness Impairment: Intellectual impairment Problem Solving: Impaired Problem Solving Impairment: Verbal basic    Extremity Assessment (includes Sensation/Coordination)  Upper Extremity Assessment: Generalized weakness, Right hand dominant RUE Deficits / Details: improving edema and hand strength. able to use xbox controller during session RUE Sensation: decreased light touch RUE Coordination: decreased fine motor, decreased gross motor LUE Deficits / Details: edema noted and impaired coordination. profound weakness LUE Sensation: decreased light touch LUE Coordination: decreased fine motor, decreased gross motor  Lower Extremity Assessment: Defer to PT evaluation    ADLs  Overall ADL's : Needs assistance/impaired Eating/Feeding: NPO Grooming: Minimal assistance, Moderate assistance, Sitting, Wash/dry face Grooming Details (indicate cue type and reason): Min A for washing face. support provided under elbow due to pt difficulty lifting to face. pt then brought face down to where hand/forearm propped on LE for washing eyes/forehead. will need increased assist for multi step ADLs Upper Body Bathing: Sitting, Moderate  assistance Lower Body Bathing: Total assistance, Sitting/lateral leans, Bed level Upper Body Dressing : Moderate assistance, Sitting Lower Body Dressing: Moderate assistance, Sitting/lateral leans, Sit to/from stand Lower Body Dressing Details (indicate cue type and reason): pt able to reach down to B feet to pull up socks EOB without LOB today. will need assist to completely don socks or manage other clothing Toilet Transfer: Minimal assistance, +2 for physical assistance, +2 for safety/equipment, Stand-pivot, Cueing for sequencing, BSC/3in1 Toilet Transfer Details (indicate cue type and reason): Min A x 2 to pivot to [facility] from recliner. improved to Min A x 1 for transfer back to recliner after using [facility] Toileting- Clothing  Manipulation and Hygiene: Total assistance, Sit to/from stand Toileting - Clothing Manipulation Details (indicate cue type and reason): Total A in standing for hygiene. OT assisting with balance while nursing assisting with hygiene    Mobility  Overal bed mobility: Needs Assistance Bed Mobility: Supine to Sit Rolling: Used rails, Min assist Supine to sit: HOB elevated, Used rails, Mod assist Sit to supine: Mod assist, +2 for physical assistance General bed mobility comments: quick movements of LE out of bed. ModA to raise trunk    Transfers  Overall transfer level: Needs assistance Equipment used: Rolling walker (2 wheels) Transfers: Sit to/from Stand Sit to Stand: Min assist Bed to/from chair/wheelchair/BSC transfer type:: Step pivot Step pivot transfers: Min assist, +2 physical assistance, +2 safety/equipment  Lateral/Scoot Transfers: Mod assist, +2 physical assistance General transfer comment: MinA to boost up and steady with use of RW, cues for hand placement    Ambulation / Gait / Stairs / Wheelchair Mobility  Ambulation/Gait Ambulation/Gait assistance: Editor, commissioning (Feet): 16 Feet Assistive device: Rolling walker (2 wheels) Gait  Pattern/deviations: Step-through pattern, Decreased step length - right, Decreased step length - left, Drifts right/left General Gait Details: pt takes short and choppy steps with MinA required to steady. Drifts L/R with cues for how to navigate RW and when to turn Gait velocity: decr    Posture / Balance Dynamic Sitting Balance Sitting balance - Comments: MinA to CGA for balance, pt with forward lean Balance Overall balance assessment: Needs assistance Sitting-balance support: Feet unsupported, Bilateral upper extremity supported Sitting balance-Leahy Scale: Fair Sitting balance - Comments: MinA to CGA for balance, pt with forward lean Postural control: Other (comment) (forward) Standing balance support: Bilateral upper extremity supported, Single extremity supported, During functional activity Standing balance-Leahy Scale: Poor Standing balance comment: reliant on RW and external support    Special needs/care consideration Continuous Drip IV  Heparin drip in place, Dialysis: {Currently receiving IHD on MWF, Oxygen currently on room air, Skin Chest incision with dressing; left groin incision with staples; right axilla with staples, and sacral wound stage 2 thickness, Abrasion: back/left, upper, Blister: neck/left,  Diabetic Management: Novolog 0-20 units 3x dialy with meals and Bowel incontinence       Previous Home Environment (from acute therapy documentation) Living Arrangements: Spouse/significant other  Lives With: Significant other Available Help at Discharge: Family, Available 24 hours/day Type of Home: House Home Layout: One level Home Access: Level entry Bathroom Shower/Tub: Engineer, manufacturing systems: Standard Home Care Services: No  Discharge Living Setting Plans for Discharge Living Setting: Apartment, Lives with (comment) (Lives with girl friend) Type of Home at Discharge: Apartment Discharge Home Layout: One level Discharge Home Access: Level entry Discharge  Bathroom Shower/Tub: Tub/shower unit, Curtain Discharge Bathroom Toilet: Standard Discharge Bathroom Accessibility: No (Door is narrow, would need to turn walker sideways) Does the patient have any problems obtaining your medications?: No  Social/Family/Support Systems Patient Roles: Other (Comment) (Has sister and a girl friend.) Contact Information: Tramaine Sauls - sister - [phone] Anticipated Caregiver: GF and sister Anticipated Caregiver's Contact Information: Honor Loh - GF/SO [phone] Ability/Limitations of Caregiver: GF can work from home.  Sister has flexible job and can assist GF as needed.  Also has friends to sit with patient as needed. Caregiver Availability: 24/7 Discharge Plan Discussed with Primary Caregiver: Yes Is Caregiver In Agreement with Plan?: Yes Does Caregiver/Family have Issues with Lodging/Transportation while Pt is in Rehab?: No   Goals Patient/Family Goal for Rehab: PT/OT/SLP supervision goals Expected  length of stay: 18-21 days Pt/Family Agrees to Admission and willing to participate: Yes Program Orientation Provided & Reviewed with Pt/Caregiver Including Roles  & Responsibilities: Yes   Decrease burden of Care through IP rehab admission: N/A   Possible need for SNF placement upon discharge:  Not anticipated   Patient Condition: I have reviewed medical records from [facility], spoken with CSW, and patient, spouse, and family member. I met with patient at the bedside and discussed via phone for inpatient rehabilitation assessment.  Patient will benefit from ongoing PT, OT, and SLP, can actively participate in 3 hours of therapy a day 5 days of the week, and can make measurable gains during the admission.  Patient will also benefit from the coordinated team approach during an Inpatient Acute Rehabilitation admission.  The patient will receive intensive therapy as well as Rehabilitation physician, nursing, social worker, and care  management interventions.  Due to bowel management, safety, skin/wound care, disease management, medication administration, pain management, and patient education the patient requires 24 hour a day rehabilitation nursing.  The patient is currently Min A with mobility and Mod A-Total A with basic ADLs.  Discharge setting and therapy post discharge at home with home health is anticipated.  Patient has agreed to participate in the Acute Inpatient Rehabilitation Program and will admit today.  Preadmission Screen Completed By:  Trish Mage, RN, with updates by Wolfgang Phoenix, MS, CCC-SLP  08/04/2023 1:27 PM ______________________________________________________________________   Discussed status with Dr. Wynn Banker on 08/04/23 at  1:27 PM and received approval for admission today.  Admission Coordinator:  Trish Mage, time 1:27 PM/Date 08/04/23

## 2023-07-31 NOTE — Progress Notes (Signed)
 [facility] rehab admissions - I met with patient at the bedside.  I later met with patient's sister, Shanda Bumps, outside [facility] unit.  I gave sister booklets and explained inpatient rehab.  Sister and girl friend plan to share caregiver duties after discharge.  GF can work from home and sister's job is flexible.  Also patient has friends who can provide supervision as needed after discharge.  Patient needs to be able to sit up in the chair for at least 1 hour at a time and be able to participate more fully with therapies.  We can then ask insurance for pre-auth for CIR.  For now, we will follow for progress and participation.  [phone]

## 2023-07-31 NOTE — Progress Notes (Addendum)
 Patient ID: Aaron [name], male   DOB: [birth year], 38 y.o.   MRN: [id number]     Advanced Heart Failure Rounding Note  Cardiologist: Christell Constant, MD  Chief Complaint: V-A ECMO  Subjective:   - Admitted 2/26 with worsening SOB, CP and a fib with RVR. - Decompensated 2/27 with IVF, nodal blockade with subsequent respiratory arrest, ROSC achieved after ~16 minute down time - Femoral VA ecmo cannulation 2/27 with Impella CP vent - ECMO decannulation 3/8 - Impella removed and extubated 3/12.  Had to be reintubated with mucus plugging and left lung opacification.  - MRI head 3/13 with small infarcts right cerebral and cerebellar hemispheres - Re-extubated 3/16  Stable off inotrope support. No central access. CRRT stopped 3/15. Now on iHD.  He remains on mexiletine and amiodarone.  Remains in NSR.    Talking more this am. Has video game setup at the bedside, has been playing!  Objective:   Echo (3/10): EF 35-40%, anterior and septal HK, normal RV, s/p Mitraclip with mild MR and mean gradient 7 mmHg.   Weight Range: 80 kg Body mass index is 21.47 kg/m.   Vital Signs:   Temp:  [98 F (36.7 C)-100.2 F (37.9 C)] 98.8 F (37.1 C) (03/20 0400) Pulse Rate:  [81-100] 85 (03/20 0630) Resp:  [0-38] 28 (03/20 0630) BP: (121-150)/(73-95) 126/77 (03/20 0630) SpO2:  [90 %-100 %] 100 % (03/20 0720) Weight:  [76.6 kg-80 kg] 80 kg (03/20 0500) Last BM Date : 07/30/23  Weight change: Filed Weights   07/30/23 0453 07/30/23 1415 07/31/23 0500  Weight: 76.6 kg 76.6 kg 80 kg    Intake/Output:   Intake/Output Summary (Last 24 hours) at 07/31/2023 0723 Last data filed at 07/31/2023 0600 Gross per 24 hour  Intake 2544.28 ml  Output 1700 ml  Net 844.28 ml    Physical Exam  General:  weak appearing.  No respiratory difficulty HEENT: normal Neck: supple. JVD flat cm. LCS HD cath Cor: PMI nondisplaced. Regular rate & rhythm. No rubs, gallops or murmurs. Lungs: clear Extremities: no  cyanosis, clubbing, rash, edema  Neuro: alert & oriented x 3. Moves all 4 extremities w/o difficulty. Affect pleasant.  Telemetry   NSR 90s (Personally reviewed)    Labs    CBC Recent Labs    07/29/23 0427 07/30/23 1430  WBC 17.6* 14.1*  HGB 8.3* 8.6*  HCT 26.2* 27.1*  MCV 94.2 94.8  PLT 357 433*   Basic Metabolic Panel Recent Labs    86/57/84 0427 07/30/23 0500  NA 132* 131*  K 4.6 4.6  CL 97* 95*  CO2 21* 19*  GLUCOSE 151* 170*  BUN 90* 130*  CREATININE 7.20* 9.41*  CALCIUM 7.7* 7.9*  MG 2.3  --   PHOS 5.0* 5.3*   Liver Function Tests Recent Labs    07/30/23 0500 07/31/23 0359  AST 60* 46*  ALT 72* 49*  ALKPHOS 282* 243*  BILITOT 0.9 0.7  PROT 6.1* 6.4*  ALBUMIN 2.1*  2.1* 2.2*   BNP: BNP (last 3 results) Recent Labs    07/09/23 1934 07/10/23 1224  BNP 703.7* 980.5*   Thyroid Function Tests No results for input(s): "TSH", "T4TOTAL", "T3FREE", "THYROIDAB" in the last 72 hours.  Invalid input(s): "FREET3"  Medications:   Scheduled Medications:  amiodarone  200 mg Oral BID   arformoterol  15 mcg Nebulization BID   ascorbic acid  250 mg Oral BID   aspirin  81 mg Oral Daily   budesonide (PULMICORT)  nebulizer solution  0.25 mg Nebulization BID   Chlorhexidine Gluconate Cloth  6 each Topical Q0600   darbepoetin (ARANESP) injection - DIALYSIS  200 mcg Subcutaneous Q Fri-1800   feeding supplement (PROSource TF20)  60 mL Per Tube BID   fiber supplement (BANATROL TF)  60 mL Per Tube QID   folic acid  1 mg Oral Daily   insulin aspart  0-20 Units Subcutaneous Q4H   methimazole  10 mg Oral Daily   mexiletine  250 mg Oral Q12H   multivitamin  1 tablet Oral QHS   mouth rinse  15 mL Mouth Rinse 4 times per day   revefenacin  175 mcg Nebulization Daily   sodium chloride flush  3 mL Intravenous Q12H   thiamine  100 mg Per Tube Daily   Or   thiamine  100 mg Intravenous Daily   zinc sulfate (50mg  elemental zinc)  220 mg Oral Daily    Infusions:   dexmedetomidine (PRECEDEX) IV infusion Stopped (07/28/23 1354)   feeding supplement (VITAL 1.5 CAL) 75 mL/hr at 07/31/23 0600   heparin 1,900 Units/hr (07/31/23 0600)    PRN Medications: acetaminophen, chlorpheniramine-HYDROcodone, Gerhardt's butt cream, HYDROmorphone (DILAUDID) injection, labetalol, lactulose, [COMPLETED] loperamide HCl **FOLLOWED BY** loperamide HCl, ondansetron (ZOFRAN) IV, mouth rinse, oxyCODONE, polyethylene glycol Patient Profile   Patient with a history of Grave's disease, severe primary mitral regurgitation who presented with chest pain and segmental PE. Subsequent progressed to SCAI Stage E shock requiring VA ECMO cannulation on 2/27.   Assessment/Plan  SCAI Stage E Cardiogenic shock - Suspect hypoxic respiratory failure driven with segmental PE on top of severe MR, nodal blockage, and thyrotoxicosis - Down time ~ 20 minutes, good mental status confirmed post code - We performed a mini-turn down on 07/15/23 at bedside; ECMO flows were reduced in a stepwise pattern by 0.5LPM to a goal of 2.5LPM. During this time, impella 5.5 flows increased to maintain net even flows. With reduction of ECMO flows, patient had onset of frequent PVCs/NSVT requiring amio boluses. In addition, despite increased LV unloading patient continued to have severe 4+ MR with progressive RV failure on bedside TTE.  - Underwent successful mTEER on 07/16/23 with placement of x3 clips and improvement in MR from severe to mild. Mean mitral gradient of 3-69mmHg.  - Decannulated from V-A ECMO by Dr. Karin Lieu on 07/19/23 with vascular cut down and primary repair of left femoral artery.  - Echo 3/10: EF 35-40%, anterior and septal HK, normal RV, s/p Mitraclip with mild MR and mean gradient 7 mmHg.  - Impella removed 3/12.  - CVVH stopped 3/15. Nephrology managing. Now on iHD.  Severe mitral valve regurgitation - Noted on previous echocardiogram - Now s/p mTEER on 07/16/23; improvement in MR from severe to mild.  -  No murmur on exam  Pulmonary hypertension - PA pressures reached 80s/30s with PCWP of ~10, TPG of ~97mmHg suggestive of PVR >5 on 07/17/22 - He is now off NO, swan is out. Overall data is not supportive with the use of sildenafil in fixed PH secondary to MS/MR.   VT/NSVT - Initially with frequent runs of NSVT likely secondary to impella 5.5 position. Impella now out.  - Now on mexiletine and amiodarone per tube.   - Decrease amiodarone to 200 mg daily - No further VT.   ID - Now off vancomycin/meropenem/micafungin  - WBCs 22.8> 26> 30> 42> 47> 31> 16.7>21>18. TMax 99.3 overnight - Bronchoscopies with thick brown secretions but no culture data.  Anemia - Hgb 8.6 3/18, transfuse hgb < 7.   Acute renal failure/hyperkalemia - Nephrology following. Had iHD 3/17. - Plan to transition to Eliquis when long term HD access in place.   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   Atrial fibrillation - Present on arrival, in the setting of PE and thyroid disease - NSR today.  - Continue amio PO  Pulmonary embolism:  - Segmental without right heart strain - Heparin gtt.    CAD - Prior embolic infarct in the LAD territory with corresponding LGE on CMR - Lifelong anticoagulation - On heparin gtt here.   Acute hypoxemic respiratory failure - Extubated and reintubated 3/12.   - Thick secretions with complete opacification of left lung, now CXR improved with bronchoscopies and suction.   - Extubated 3/16 - Continue use of flutter valve/ IS  CVA - MRI head 3/13 with small infarcts right cerebral and cerebellar hemispheres. This should not significantly affect consciousness.  - Suspect embolic, on heparin gtt.  - More interactive today  Stable for transfer for 2C. Consult CIR.   Length of Stay: 22  Alen Bleacher, NP  07/31/2023, 7:23 AM  Advanced Heart  Failure Team Pager [phone] (M-F; 7a - 5p)  Please contact [facility] for night-coverage after hours (5p -7a ) and weekends on [email or web address]  Patient seen and examined with the above-signed Advanced Practice Provider and/or Housestaff. I personally reviewed laboratory data, imaging studies and relevant notes. I independently examined the patient and formulated the important aspects of the plan. I have edited the note to reflect any of my changes or salient points. I have personally discussed the plan with the patient and/or family.  Sitting up in bed playing Xbox   Denies CP or SOB. Tolerated iHD well. Rhythm stable.   On heparin. No bleeding  General:  Sitting up in bed No resp difficulty HEENT: normal Neck: supple.JVP 8-9 Carotids 2+ bilat; no bruits. No lymphadenopathy or thryomegaly appreciated. + HD cath Cor: impella staples ok Regular rate & rhythm. No rubs, gallops or murmurs. Lungs: clear Abdomen: soft, nontender, nondistended. No hepatosplenomegaly. No bruits or masses. Good bowel sounds. Extremities: no cyanosis, clubbing, rash, edema Neuro: alert & orientedx3, cranial nerves grossly intact. moves all 4 extremities w/o difficulty. Affect pleasant  Doing well from cardiac perspective. Tolerating iHD. Awaiting decision on [facility]   Will repeat echo.   Agree with plan for CIR.  Arvilla Meres, MD  3:39 PM

## 2023-07-31 NOTE — Progress Notes (Signed)
 PROGRESS NOTE  Aaron [name] BJS:[id number] DOB: 1985-09-19 DOA: 07/09/2023 PCP: Patient, No Pcp Per   LOS: 22 days   Brief Narrative / Interim history: 38 year old male with hypothyroidism, PAF on anticoagulation (per notes nonadherent to Eliquis), severe mitral regurgitation comes into the hospital on 2/26 with shortness of breath, found to have a PE and A-fib with RVR, started on heparin, he was also suspected thyrotoxicosis.  He was placed on beta-blockers, methimazole, steroids but rapidly deteriorated 2/27 and went to respiratory arrest with V-fib arrest.  Underwent 40 minutes of CPR followed by ROSC.  Post arrest course complicated by ATN requiring CRRT now on HD, he was also on ECMO status post decannulation on 3/8, also required Impella eventually removed 3/12.  He failed extubation x 1 due to mucous plugging, but eventually extubated 3/16.  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  Significant events: 2/26 admitted +PE 2/27 Vtac/ Vfib arrest> rosc after 40 mins, s/p TNK,  VA ecmo 3/5 salvage TEER (transcatheter edge to edge mitral valve repair) 3/7 weaned off VA 3/8 bronch for mucus plugging, VA ecmo decannulation >impella 5.5 3/12 impella removed, extubation trial 2/28 CRRT 3/13 reintubated, bronch for recurrent plugging, HD cath rewired due to malfunction 3/15 CRRT stopped, remains anuric 3/16 awake f/c, extubated  Subjective / 24h Interval events: He is awake this morning, alert, conversant.  States that he feels well, denies any chest pain, denies any shortness of breath feeling stronger and stronger.  Assesement and Plan: Principal Problem:   Bilateral pulmonary embolism (HCC) Active Problems:   Paroxysmal atrial fibrillation (HCC)   Hyperthyroidism   Chronic heart failure with preserved ejection fraction (HFpEF) (HCC)   Severe mitral regurgitation   Cardiac arrest (HCC)   Cardiogenic shock (HCC)   Hyperkalemia   On mechanically assisted  ventilation (HCC)   Acute respiratory failure with hypoxia (HCC)   Palliative care by specialist   Protein-calorie malnutrition, severe  Principal problem Cardiogenic shock - Suspect respiratory failure was mainly due to PE in the setting of mitral regurgitation and possibly thyrotoxicosis.  Suspected downtime about 20 minutes.  -Cardiology consulted and following while hospitalized, underwent ECMO with Impella, decannulated on 3/8 and Impella removed and extubated on 3/12, had to be re-intubated but eventually extubated again on 3/16 -he required inotropic support, now off  Active problems Acute kidney injury due to ATN-following cardiac arrest.  Required CRRT, now on IHD.  Nephrology consulted.,  Appreciate follow-up, tolerating IHD.  Not sure whether he will have renal recovery  Acute systolic CHF, severe mitral valve regurgitation -cardiology following, advanced heart failure following.  Due to frequent PVCs and SVT has been placed on amiodarone. -Now status post mitral transcatheter edge-to-edge repair (mTEER) on 07/16/2023 with placement of 3 clips and improvement in MR from severe to mild -Continue management per cardiology  Acute respiratory failure with hypoxia, pulmonary hypertension, acute PE -has improved significantly, currently tolerating IV heparin and has been weaned off to room air.  PAF-continue amiodarone, anticoagulation  PE-on heparin  Hyperthyroidism/possibly thyrotoxicosis - Continue methimazole. Need to check TSH in 4 weeks.  Last TSH was checked on 3/14 showed some improvement at 0.03   ID  -previously on Vanco meropenem and micafungin, now off antibiotics.  Cultures without growth   Normocytic anemia -Hemoglobin stable   Acute small right CVA  - MRI of the head on 07/24/2022 shows marked fluid to the right cerebellar and cerebral hemisphere. Suspect embolic, continue IV heparin.  Nutrition/moderate protein  caloric malnutrition - Continue tube feedings.  RD  following, advance diet as tolerated   Deconditioning -unclear his posthospitalization needs   Stage II sacral cubitus ulcer -noted  Scheduled Meds:  amiodarone  200 mg Oral Daily   arformoterol  15 mcg Nebulization BID   ascorbic acid  250 mg Oral BID   aspirin  81 mg Oral Daily   budesonide (PULMICORT) nebulizer solution  0.25 mg Nebulization BID   Chlorhexidine Gluconate Cloth  6 each Topical Q0600   darbepoetin (ARANESP) injection - DIALYSIS  200 mcg Subcutaneous Q Fri-1800   feeding supplement (PROSource TF20)  60 mL Per Tube BID   fiber supplement (BANATROL TF)  60 mL Oral QID   folic acid  1 mg Oral Daily   insulin aspart  0-20 Units Subcutaneous Q4H   methimazole  10 mg Oral Daily   mexiletine  250 mg Oral Q12H   multivitamin  1 tablet Oral QHS   mouth rinse  15 mL Mouth Rinse 4 times per day   revefenacin  175 mcg Nebulization Daily   sodium chloride flush  3 mL Intravenous Q12H   [START ON 08/01/2023] thiamine  100 mg Oral Daily   Or   [START ON 08/01/2023] thiamine  100 mg Intravenous Daily   zinc sulfate (50mg  elemental zinc)  220 mg Oral Daily   Continuous Infusions:  dexmedetomidine (PRECEDEX) IV infusion Stopped (07/28/23 1354)   feeding supplement (VITAL 1.5 CAL) 75 mL/hr at 07/31/23 0600   heparin 1,900 Units/hr (07/31/23 0600)   PRN Meds:.acetaminophen, chlorpheniramine-HYDROcodone, Gerhardt's butt cream, HYDROmorphone (DILAUDID) injection, labetalol, lactulose, [COMPLETED] loperamide HCl **FOLLOWED BY** loperamide HCl, ondansetron (ZOFRAN) IV, mouth rinse, oxyCODONE, polyethylene glycol  Current Outpatient Medications  Medication Instructions   apixaban (ELIQUIS) 5 mg, Oral, 2 times daily   bismuth subsalicylate (PEPTO BISMOL) 524 mg, As needed   methimazole (TAPAZOLE) 10 mg, Oral, Every 8 hours   Metoprolol Tartrate 37.5 mg, Oral, 2 times daily   spironolactone (ALDACTONE) 25 mg, Oral, Daily    Diet Orders (From admission, onward)     Start     Ordered    07/29/23 1408  DIET DYS 3 Room service appropriate? Yes with Assist; Fluid consistency: Honey Thick  Diet effective now       Question Answer Comment  Room service appropriate? Yes with Assist   Fluid consistency: Honey Thick      07/29/23 1408            DVT prophylaxis: Heparin infusion   Lab Results  Component Value Date   PLT 433 (H) 07/30/2023      Code Status: Full Code  Family Communication: Family at bedside  Status is: Inpatient Remains inpatient appropriate because: Severity of illness  Level of care: Progressive  Consultants:  Cardiology Nephrology  Objective: Vitals:   07/31/23 0716 07/31/23 0719 07/31/23 0720 07/31/23 0800  BP:    136/87  Pulse:    89  Resp:    (!) 28  Temp:      TempSrc:      SpO2: 100% 100% 100% 97%  Weight:      Height:        Intake/Output Summary (Last 24 hours) at 07/31/2023 0919 Last data filed at 07/31/2023 0600 Gross per 24 hour  Intake 2348.55 ml  Output 1700 ml  Net 648.55 ml   Wt Readings from Last 3 Encounters:  07/31/23 80 kg  03/27/23 95.3 kg  03/20/23 89.2 kg    Examination:  Constitutional: NAD Eyes: no scleral icterus ENMT: Mucous membranes are moist.  Neck: normal, supple Respiratory: clear to auscultation bilaterally, no wheezing, no crackles. Cardiovascular: Regular rate and rhythm, no edema Abdomen: non distended, no tenderness. Bowel sounds positive.  Musculoskeletal: no clubbing / cyanosis.  Skin: no rashes Neurologic: non focal, follows commands, moves all 4 extremities equally   Data Reviewed: I have independently reviewed following labs and imaging studies   CBC Recent Labs  Lab 07/26/23 0437 07/26/23 0443 07/27/23 0500 07/27/23 1608 07/28/23 0512 07/29/23 0427 07/30/23 1430  WBC 16.7*  --  20.9*  --  18.4* 17.6* 14.1*  HGB 7.5*   < > 7.6* 11.9* 7.6* 8.3* 8.6*  HCT 23.8*   < > 24.2* 35.0* 24.2* 26.2* 27.1*  PLT 185  --  212  --  307 357 433*  MCV 93.7  --  96.0  --  94.9  94.2 94.8  MCH 29.5  --  30.2  --  29.8 29.9 30.1  MCHC 31.5  --  31.4  --  31.4 31.7 31.7  RDW 20.8*  --  20.5*  --  20.6* 19.9* 19.9*   < > = values in this interval not displayed.    Recent Labs  Lab 07/24/23 1155 07/24/23 1542 07/24/23 2010 07/25/23 0208 07/25/23 0407 07/25/23 0935 07/25/23 1246 07/26/23 0437 07/26/23 0443 07/27/23 0500 07/27/23 1503 07/27/23 1608 07/28/23 0512 07/29/23 0427 07/30/23 0500 07/31/23 0359  NA  --    < >  --  130*   < >  --    < > 134*  133*   < > 137  133* 134* 135 133* 132* 131*  --   K  --    < >  --  3.8   < >  --    < > 3.9  3.8   < > 4.4  4.3 4.4 4.5 4.6 4.6 4.6  --   CL  --    < >  --  97*   < >  --    < > 100  101   < > 102  102 102  --  103 97* 95*  --   CO2  --    < >  --  20*   < >  --    < > 21*  22   < > 17*  20* 16*  --  18* 21* 19*  --   GLUCOSE  --    < >  --  112*   < >  --    < > 150*  153*   < > 132*  136* 148*  --  170* 151* 170*  --   BUN  --    < >  --  54*   < >  --    < > 47*  46*   < > 75*  75* 90*  --  116* 90* 130*  --   CREATININE  --    < >  --  3.39*   < >  --    < > 3.39*  3.24*   < > 5.69*  5.80* 7.40*  --  8.93* 7.20* 9.41*  --   CALCIUM  --    < >  --  8.0*   < >  --    < > 8.2*  8.0*   < > 8.0*  7.8* 7.9*  --  7.9* 7.7* 7.9*  --   AST  --    < >  --  49*  --   --   --  49*  --  46*  --   --  42* 396* 60* 46*  ALT  --    < >  --  23  --   --   --  25  --  27  --   --  26 147* 72* 49*  ALKPHOS  --    < >  --  118  --   --   --  128*  --  118  --   --  115 500* 282* 243*  BILITOT  --    < >  --  1.3*  --   --   --  1.0  --  1.0  --   --  1.1 0.9 0.9 0.7  ALBUMIN  --    < >  --  2.0*   < >  --    < > 2.2*  2.1*   < > 2.2*  2.1* 2.1*  --  2.1*  2.1* 2.2* 2.1*  2.1* 2.2*  MG  --   --   --  2.3  --   --   --  2.2  --  2.3  --   --  2.6* 2.3  --   --   PROCALCITON 6.84  --   --   --   --  6.36  --   --   --   --   --   --   --   --   --   --   LATICACIDVEN  --   --  0.8  --   --   --   --   --    --   --   --   --   --   --   --   --   TSH  --   --   --  0.033*  --   --   --   --   --   --   --   --   --   --   --   --   AMMONIA 63*  --   --   --   --   --   --   --   --   --   --   --   --   --   --   --    < > = values in this interval not displayed.    ------------------------------------------------------------------------------------------------------------------ No results for input(s): "CHOL", "HDL", "LDLCALC", "TRIG", "CHOLHDL", "LDLDIRECT" in the last 72 hours.  Lab Results  Component Value Date   HGBA1C 4.8 07/10/2023   ------------------------------------------------------------------------------------------------------------------ No results for input(s): "TSH", "T4TOTAL", "T3FREE", "THYROIDAB" in the last 72 hours.  Invalid input(s): "FREET3"  Cardiac Enzymes No results for input(s): "CKMB", "TROPONINI", "MYOGLOBIN" in the last 168 hours.  Invalid input(s): "CK" ------------------------------------------------------------------------------------------------------------------    Component Value Date/Time   BNP 980.5 (H) 07/10/2023 1224    CBG: Recent Labs  Lab 07/30/23 1849 07/30/23 2000 07/30/23 2325 07/31/23 0345 07/31/23 0733  GLUCAP 182* 186* 121* 96 170*    Recent Results (from the past 240 hours)  Surgical PCR screen     Status: None   Collection Time: 07/23/23 12:34 AM   Specimen: Nasal Mucosa; Nasal Swab  Result Value Ref Range Status   MRSA, PCR NEGATIVE NEGATIVE Final   Staphylococcus aureus NEGATIVE NEGATIVE Final    Comment: (NOTE) The Xpert SA Assay (FDA approved for NASAL  specimens in patients 37 years of age and older), is one component of a comprehensive surveillance program. It is not intended to diagnose infection nor to guide or monitor treatment. Performed at [facility], [address], [place], [place] [address]   Culture, Respiratory w Gram Stain     Status: None   Collection Time: 07/24/23  5:17 AM   Specimen:  Tracheal Aspirate; Respiratory  Result Value Ref Range Status   Specimen Description TRACHEAL ASPIRATE  Final   Special Requests NONE  Final   Gram Stain NO WBC SEEN NO ORGANISMS SEEN   Final   Culture   Final    FEW Normal respiratory flora-no Staph aureus or Pseudomonas seen Performed at [facility], [address] [place], [place] [address]    Report Status 07/26/2023 FINAL  Final     Radiology Studies: No results found.   Pamella Pert, MD, PhD [facility]  Between 7 am - 7 pm I am available, please contact me via Amion (for emergencies) or Securechat (non urgent messages)  Between 7 pm - 7 am I am not available, please contact night coverage MD/APP via Amion

## 2023-07-31 NOTE — Plan of Care (Signed)

## 2023-08-01 ENCOUNTER — Inpatient Hospital Stay (HOSPITAL_COMMUNITY)

## 2023-08-01 ENCOUNTER — Encounter (HOSPITAL_COMMUNITY): Payer: Self-pay | Admitting: *Deleted

## 2023-08-01 DIAGNOSIS — I342 Nonrheumatic mitral (valve) stenosis: Secondary | ICD-10-CM | POA: Diagnosis not present

## 2023-08-01 DIAGNOSIS — I469 Cardiac arrest, cause unspecified: Secondary | ICD-10-CM | POA: Diagnosis not present

## 2023-08-01 DIAGNOSIS — E875 Hyperkalemia: Secondary | ICD-10-CM | POA: Diagnosis not present

## 2023-08-01 DIAGNOSIS — R0602 Shortness of breath: Secondary | ICD-10-CM | POA: Diagnosis not present

## 2023-08-01 DIAGNOSIS — R570 Cardiogenic shock: Secondary | ICD-10-CM | POA: Diagnosis not present

## 2023-08-01 DIAGNOSIS — E872 Acidosis, unspecified: Secondary | ICD-10-CM | POA: Diagnosis not present

## 2023-08-01 DIAGNOSIS — R079 Chest pain, unspecified: Secondary | ICD-10-CM | POA: Diagnosis not present

## 2023-08-01 DIAGNOSIS — J9601 Acute respiratory failure with hypoxia: Secondary | ICD-10-CM | POA: Diagnosis not present

## 2023-08-01 DIAGNOSIS — N170 Acute kidney failure with tubular necrosis: Secondary | ICD-10-CM | POA: Diagnosis not present

## 2023-08-01 DIAGNOSIS — I2699 Other pulmonary embolism without acute cor pulmonale: Secondary | ICD-10-CM | POA: Diagnosis not present

## 2023-08-01 LAB — COMPREHENSIVE METABOLIC PANEL
ALT: 128 U/L — ABNORMAL HIGH (ref 0–44)
AST: 111 U/L — ABNORMAL HIGH (ref 15–41)
Albumin: 2.2 g/dL — ABNORMAL LOW (ref 3.5–5.0)
Alkaline Phosphatase: 494 U/L — ABNORMAL HIGH (ref 38–126)
Anion gap: 13 (ref 5–15)
BUN: 117 mg/dL — ABNORMAL HIGH (ref 6–20)
CO2: 21 mmol/L — ABNORMAL LOW (ref 22–32)
Calcium: 7.6 mg/dL — ABNORMAL LOW (ref 8.9–10.3)
Chloride: 95 mmol/L — ABNORMAL LOW (ref 98–111)
Creatinine, Ser: 9.47 mg/dL — ABNORMAL HIGH (ref 0.61–1.24)
GFR, Estimated: 7 mL/min — ABNORMAL LOW (ref >60)
Glucose, Bld: 156 mg/dL — ABNORMAL HIGH (ref 70–99)
Potassium: 5.3 mmol/L — ABNORMAL HIGH (ref 3.5–5.1)
Sodium: 129 mmol/L — ABNORMAL LOW (ref 135–145)
Total Bilirubin: 0.8 mg/dL (ref 0.0–1.2)
Total Protein: 6 g/dL — ABNORMAL LOW (ref 6.5–8.1)

## 2023-08-01 LAB — CBC
HCT: 26.2 % — ABNORMAL LOW (ref 39.0–52.0)
Hemoglobin: 8.4 g/dL — ABNORMAL LOW (ref 13.0–17.0)
MCH: 30.3 pg (ref 26.0–34.0)
MCHC: 32.1 g/dL (ref 30.0–36.0)
MCV: 94.6 fL (ref 80.0–100.0)
Platelets: 432 10*3/uL — ABNORMAL HIGH (ref 150–400)
RBC: 2.77 MIL/uL — ABNORMAL LOW (ref 4.22–5.81)
RDW: 19 % — ABNORMAL HIGH (ref 11.5–15.5)
WBC: 9.9 10*3/uL (ref 4.0–10.5)
nRBC: 0 % (ref 0.0–0.2)

## 2023-08-01 LAB — ECHOCARDIOGRAM COMPLETE
Height: 76 in
MV VTI: 1.94 cm2
S' Lateral: 4.4 cm
Weight: 2733.7 [oz_av]

## 2023-08-01 LAB — GLUCOSE, CAPILLARY
Glucose-Capillary: 111 mg/dL — ABNORMAL HIGH (ref 70–99)
Glucose-Capillary: 131 mg/dL — ABNORMAL HIGH (ref 70–99)
Glucose-Capillary: 141 mg/dL — ABNORMAL HIGH (ref 70–99)
Glucose-Capillary: 142 mg/dL — ABNORMAL HIGH (ref 70–99)
Glucose-Capillary: 153 mg/dL — ABNORMAL HIGH (ref 70–99)
Glucose-Capillary: 164 mg/dL — ABNORMAL HIGH (ref 70–99)
Glucose-Capillary: 87 mg/dL (ref 70–99)

## 2023-08-01 LAB — HEPARIN LEVEL (UNFRACTIONATED): Heparin Unfractionated: 0.47 [IU]/mL (ref 0.30–0.70)

## 2023-08-01 LAB — PHOSPHORUS: Phosphorus: 5.8 mg/dL — ABNORMAL HIGH (ref 2.5–4.6)

## 2023-08-01 LAB — MAGNESIUM: Magnesium: 2.3 mg/dL (ref 1.7–2.4)

## 2023-08-01 LAB — LIDOCAINE LEVEL: Lidocaine Lvl: 2.2 ug/mL (ref 1.5–5.0)

## 2023-08-01 MED ORDER — CEFAZOLIN SODIUM-DEXTROSE 2-4 GM/100ML-% IV SOLN
2.0000 g | INTRAVENOUS | Status: DC
Start: 2023-08-01 — End: 2023-08-01

## 2023-08-01 MED ORDER — PROSOURCE TF20 ENFIT COMPATIBL EN LIQD
60.0000 mL | Freq: Three times a day (TID) | ENTERAL | Status: DC
  Filled 2023-08-01: qty 60

## 2023-08-01 MED ORDER — CEFAZOLIN SODIUM-DEXTROSE 2-4 GM/100ML-% IV SOLN: 2.0000 g | INTRAVENOUS | Status: DC

## 2023-08-01 MED ORDER — CEFAZOLIN SODIUM-DEXTROSE 2-4 GM/100ML-% IV SOLN: 2.0000 g | Freq: Once | INTRAVENOUS | Status: DC

## 2023-08-01 MED ORDER — NEPRO/CARBSTEADY PO LIQD
1000.0000 mL | ORAL | Status: DC
  Administered 2023-08-01: 1000 mL

## 2023-08-01 NOTE — Progress Notes (Signed)
  Echocardiogram 2D Echocardiogram has been performed.  Aaron [name] 08/01/2023, 5:34 PM

## 2023-08-01 NOTE — Consult Note (Addendum)
 Performed chart review to assist in evaluation of patient appropriateness for inpatient rehab admission.  Aaron [name] is a 38 year old male with past medical history of hypothyroidism, A-fib on anticoagulation, and severe mitral regurg who presented to the hospital on 2-26 with suspected medication noncompliance resulting in a PE , A-fib with RVR, and thyrotoxicosis.  On 2-27, he underwent respiratory and V-fib arrest, underwent 40 minutes of CPR followed by ROSC.  Postarrest course was complicated by ATN, now on hemodialysis, severe mitral regurg requiring mTEER 3-5, and ECMO transition to Impella and ultimately removed on 3-12.  MRI brain 3-13 showed small cerebellar infarcts, likely embolic.  He was extubated on 3-16.  He remains on tube feeds for malnutrition and dysphagia.  Per chart review, prior to admission, patient was independent for mobility and ADLs, and employed as a Paediatric nurse.  He lives in a single-story home with a level entry with 24/7 assistance available by his sister and girlfriend.  Girlfriend plans to work from home, and sister's job is flexible. Patient is making progress with therapies, and today performed supine to sit with mod assist and min assist to stand with rolling walker, ambulated 16 feet with a rolling walker with min assist.  Given the above information, I believe the patient is appropriate for consideration of inpatient rehab.  He has good family support and a reasonable dispo plan, but significant physical needs requiring PT, OT, and SLP services.  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  Given this, I would anticipate a longer length of stay approximately 18 to 21 days.   Aaron Sheriff, DO 08/01/2023

## 2023-08-01 NOTE — Progress Notes (Signed)
 PHARMACY - ANTICOAGULATION CONSULT NOTE  Pharmacy Consult for Heparin  Indication: pulmonary embolus, s/p ECMO decannulation 3/7, Impella removal 3/12  No Known Allergies  Patient Measurements: Height: 6\' 4"  (193 cm) Weight: 77.3 kg (170 lb 6.7 oz) IBW/kg (Calculated) : 86.8 Heparin Dosing Weight: n/a  Vital Signs: Temp: 98 F (36.7 C) (03/21 0400) Temp Source: Oral (03/21 0400) BP: 144/99 (03/21 0600) Pulse Rate: 83 (03/21 0744)  Labs: Recent Labs    07/30/23 0500 07/30/23 1430 07/31/23 0359 08/01/23 0418  HGB  --  8.6*  --  8.4*  HCT  --  27.1*  --  26.2*  PLT  --  433*  --  432*  HEPARINUNFRC 0.47  --  0.39 0.47  CREATININE 9.41*  --   --  9.47*    Estimated Creatinine Clearance: 11.6 mL/min (A) (by C-G formula based on SCr of 9.47 mg/dL (H)).   Medical History: Past Medical History:  Diagnosis Date   Atrial fibrillation [facility])    Hyperthyroidism    Mitral regurgitation    NSTEMI (non-ST elevated myocardial infarction) [facility])     Assessment: 38 yo M with bilateral PE s/p TNK (2/27 @1156 ) now on [place] ECMO + Impella. Pharmacy consulted for bivalirudin for anticoagulation.   S/p impella CP >5.5 3/3 - bivalirudin was previously held with oozing at insertion site but restart 3/4. Patient underwent mitraclip 3/5.  Bival transitioned to heparin 3/7 after decannulation.  Impella 5.5 removed 3/13. Heparin restarted 3/13. Hep in CRRT stopped 3/15 with CRRT stopping.   Heparin level came back therapeutic at 0.47, on 1900 units/hr. CBC stable, planning to begin apixaban once permanent HD line placed.  Goal of Therapy:  Heparin level 0.3-0.7 units/mL Monitor platelets by anticoagulation protocol: Yes   Plan:  Continue heparin infusion at 1900 units/hr Heparin level and CBC daily  Monitor s/sx of bleeding    Fredonia Highland, PharmD, BCPS, [facility] Clinical Pharmacist [phone] Please check AMION for all [facility] Pharmacy numbers 08/01/2023

## 2023-08-01 NOTE — Progress Notes (Addendum)
 Patient ID: Aaron [name], male   DOB: 09-Oct-1985, 38 y.o.   MRN: [id number]     Advanced Heart Failure Rounding Note  Cardiologist: Christell Constant, MD  Chief Complaint: V-A ECMO  Subjective:   - Admitted 2/26 with worsening SOB, CP and a fib with RVR. - Decompensated 2/27 with IVF, nodal blockade with subsequent respiratory arrest, ROSC achieved after ~16 minute down time - Femoral VA ecmo cannulation 2/27 with Impella CP vent - ECMO decannulation 3/8 - Impella removed and extubated 3/12.  Had to be reintubated with mucus plugging and left lung opacification.  - MRI head 3/13 with small infarcts right cerebral and cerebellar hemispheres - Re-extubated 3/16  Stable off inotrope support. Had iHD today. Tolerated ok. Feels fine. Denies dyspnea.     Objective:   Echo (3/10): EF 35-40%, anterior and septal HK, normal RV, s/p Mitraclip with mild MR and mean gradient 7 mmHg.   Weight Range: 78.5 kg Body mass index is 21.07 kg/m.   Vital Signs:   Temp:  [97.6 F (36.4 C)-99.7 F (37.6 C)] 99.5 F (37.5 C) (03/21 1021) Pulse Rate:  [67-96] 84 (03/21 1230) Resp:  [19-33] 27 (03/21 1230) BP: (120-164)/(70-107) 137/83 (03/21 1330) SpO2:  [95 %-100 %] 100 % (03/21 1230) Weight:  [77.3 kg-78.5 kg] 78.5 kg (03/21 1023) Last BM Date : 08/01/23  Weight change: Filed Weights   07/31/23 0500 08/01/23 0434 08/01/23 1023  Weight: 80 kg 77.3 kg 78.5 kg    Intake/Output:   Intake/Output Summary (Last 24 hours) at 08/01/2023 1346 Last data filed at 08/01/2023 0946 Gross per 24 hour  Intake 1907.19 ml  Output 400 ml  Net 1507.19 ml    Physical Exam   General:  weak appearing. No respiratory difficulty HEENT: normal Neck: supple. JVD 6 cm. Carotids 2+ bilat; no bruits. No lymphadenopathy or thyromegaly appreciated. Cor: PMI nondisplaced. Regular rate & rhythm. No rubs, gallops or murmurs. Lungs: clear Abdomen: soft, nontender, nondistended. No hepatosplenomegaly. No bruits or  masses. Good bowel sounds. Extremities: no cyanosis, clubbing, rash, edema Neuro: alert & oriented x 3, cranial nerves grossly intact. moves all 4 extremities w/o difficulty. Affect pleasant.   Telemetry   ST low 100s (Personally reviewed)    Labs    CBC Recent Labs    07/30/23 1430 08/01/23 0418  WBC 14.1* 9.9  HGB 8.6* 8.4*  HCT 27.1* 26.2*  MCV 94.8 94.6  PLT 433* 432*   Basic Metabolic Panel Recent Labs    25/36/64 0500 08/01/23 0418  NA 131* 129*  K 4.6 5.3*  CL 95* 95*  CO2 19* 21*  GLUCOSE 170* 156*  BUN 130* 117*  CREATININE 9.41* 9.47*  CALCIUM 7.9* 7.6*  MG  --  2.3  PHOS 5.3* 5.8*   Liver Function Tests Recent Labs    07/31/23 0359 08/01/23 0418  AST 46* 111*  ALT 49* 128*  ALKPHOS 243* 494*  BILITOT 0.7 0.8  PROT 6.4* 6.0*  ALBUMIN 2.2* 2.2*   BNP: BNP (last 3 results) Recent Labs    07/09/23 1934 07/10/23 1224  BNP 703.7* 980.5*   Thyroid Function Tests No results for input(s): "TSH", "T4TOTAL", "T3FREE", "THYROIDAB" in the last 72 hours.  Invalid input(s): "FREET3"  Medications:   Scheduled Medications:  amiodarone  200 mg Oral Daily   arformoterol  15 mcg Nebulization BID   ascorbic acid  250 mg Oral BID   aspirin  81 mg Oral Daily   budesonide (PULMICORT) nebulizer solution  0.25 mg Nebulization BID   Chlorhexidine Gluconate Cloth  6 each Topical Q0600   darbepoetin (ARANESP) injection - DIALYSIS  200 mcg Subcutaneous Q Fri-1800   feeding supplement (PROSource TF20)  60 mL Per Tube BID   fiber supplement (BANATROL TF)  60 mL Oral QID   folic acid  1 mg Oral Daily   insulin aspart  0-20 Units Subcutaneous Q4H   methimazole  10 mg Oral Daily   mexiletine  250 mg Oral Q12H   multivitamin  1 tablet Oral QHS   mouth rinse  15 mL Mouth Rinse 4 times per day   revefenacin  175 mcg Nebulization Daily   sodium chloride flush  3 mL Intravenous Q12H   thiamine  100 mg Oral Daily   Or   thiamine  100 mg Intravenous Daily   zinc  sulfate (50mg  elemental zinc)  220 mg Oral Daily    Infusions:  feeding supplement (VITAL 1.5 CAL) 75 mL/hr at 08/01/23 0600   heparin 1,900 Units/hr (08/01/23 0600)    PRN Medications: acetaminophen, chlorpheniramine-HYDROcodone, Gerhardt's butt cream, HYDROmorphone (DILAUDID) injection, labetalol, lactulose, [COMPLETED] loperamide HCl **FOLLOWED BY** loperamide HCl, ondansetron (ZOFRAN) IV, mouth rinse, oxyCODONE, polyethylene glycol Patient Profile   Patient with a history of Grave's disease, severe primary mitral regurgitation who presented with chest pain and segmental PE. Subsequent progressed to SCAI Stage E shock requiring VA ECMO cannulation on 2/27.   Assessment/Plan  SCAI Stage E Cardiogenic shock - Suspect hypoxic respiratory failure driven with segmental PE on top of severe MR, nodal blockage, and thyrotoxicosis - Down time ~ 20 minutes, good mental status confirmed post code - We performed a mini-turn down on 07/15/23 at bedside; ECMO flows were reduced in a stepwise pattern by 0.5LPM to a goal of 2.5LPM. During this time, impella 5.5 flows increased to maintain net even flows. With reduction of ECMO flows, patient had onset of frequent PVCs/NSVT requiring amio boluses. In addition, despite increased LV unloading patient continued to have severe 4+ MR with progressive RV failure on bedside TTE.  - Underwent successful mTEER on 07/16/23 with placement of x3 clips and improvement in MR from severe to mild. Mean mitral gradient of 3-28mmHg.  - Decannulated from V-A ECMO by Dr. Karin Lieu on 07/19/23 with vascular cut down and primary repair of left femoral artery.  - Echo 3/10: EF 35-40%, anterior and septal HK, normal RV, s/p Mitraclip with mild MR and mean gradient 7 mmHg.  - Impella removed 3/12.  - CVVH stopped 3/15. Nephrology managing. Now on iHD. Volume ok.  - repeat echo today   Severe mitral valve regurgitation - Noted on previous echocardiogram - Now s/p mTEER on 07/16/23;  improvement in MR from severe to mild.  - No murmur on exam. Repeat echo   Pulmonary hypertension - PA pressures reached 80s/30s with PCWP of ~10, TPG of ~36mmHg suggestive of PVR >5 on 07/17/22 - He is now off NO, swan is out. Overall data is not supportive with the use of sildenafil in fixed PH secondary to MS/MR.   VT/NSVT - Initially with frequent runs of NSVT likely secondary to impella 5.5 position. Impella now out.  - no further VT - continue mexiletine 250 bid - continue amiodarone 200 mg dail  Acute renal failure/hyperkalemia - Nephrology following. Had iHD 3/17. - Plan to transition to Eliquis when long term HD access in place.   Thyrotoxicosis - Was not taking medications as they made him feel poorly - Suspect underlying low  output heart failure due to mitral valve disease - On methimazole 10 tid.   Atrial fibrillation - Present on arrival, in the setting of PE and thyroid disease - maintaining NSR  - Continue amio PO - heparin gtt. Eliquis after [facility] cath placed  Pulmonary embolism:  - Segmental without right heart strain - Heparin gtt.  Eliquis after [facility] cath placed  CAD - Prior embolic infarct in the LAD territory with corresponding LGE on CMR - Lifelong anticoagulation - On heparin gtt   Acute hypoxemic respiratory failure - Extubated and reintubated 3/12.   - Extubated 3/16 - Continue use of flutter valve/ IS  CVA - MRI head 3/13 with small infarcts right cerebral and cerebellar hemispheres. This should not significantly affect consciousness.  - Suspect embolic, on heparin gtt.  - will need rehab. Awaiting insurance authorization for CIR    Length of Stay: [address], PA-C  08/01/2023, 1:46 PM  Advanced Heart Failure Team Pager [phone] (M-F; 7a - 5p)  Please contact [facility] for night-coverage after hours (5p -7a ) and weekends on [email or web address]  Patient seen and examined with the above-signed Advanced Practice Provider and/or  Housestaff. I personally reviewed laboratory data, imaging studies and relevant notes. I independently examined the patient and formulated the important aspects of the plan. I have edited the note to reflect any of my changes or salient points. I have personally discussed the plan with the patient and/or family.  Tolerated iHD well again today.   Denies CP or SOB. Rhythm stable. On heparin. No bleeding   General:  Sitting up in bed. No resp difficulty HEENT: normal Neck: supple. N+ HD cath Carotids 2+ bilat; no bruits. No lymphadenopathy or thryomegaly appreciated. Cor: Regular rate & rhythm. No rubs, gallops or murmurs. Lungs: clear Abdomen: soft, nontender, nondistended. No hepatosplenomegaly. No bruits or masses. Good bowel sounds. Extremities: no cyanosis, clubbing, rash, edema Neuro: alert & orientedx3, cranial nerves grossly intact. moves all 4 extremities w/o difficulty. Affect pleasant  Continues to improve. Stable from HF perspective. Repeating echo today.   GDMT limited by renal failure  Continue heparin and amio. Can switch to Eliquis when [facility] placed (discussed personally with Nephrology today)   He is ready to move to floor and/or CIR from my standpoint.  HF team will see again Monday unless called.  Arvilla Meres, MD  5:01 PM

## 2023-08-01 NOTE — Progress Notes (Signed)
 SLP Cancellation Note  Patient Details Name: PROCTOR CARRIKER MRN: [id number] DOB: 07/21/1985   Cancelled treatment:       Reason Eval/Treat Not Completed: Patient at procedure or test/unavailable (HD). SLP will f/u as able.    Gwynneth Aliment, M.A., CF-SLP Speech Language Pathology, Acute Rehabilitation Services  Secure Chat preferred [phone]  08/01/2023, 10:50 AM

## 2023-08-01 NOTE — Progress Notes (Signed)
 PROGRESS NOTE  Aaron [name] NWG:[id number] DOB: 1985-08-25 DOA: 07/09/2023 PCP: Patient, No Pcp Per   LOS: 23 days   Brief Narrative / Interim history: 38 year old male with hypothyroidism, PAF on anticoagulation (per notes nonadherent to Eliquis), severe mitral regurgitation comes into the hospital on 2/26 with shortness of breath, found to have a PE and A-fib with RVR, started on heparin, he was also suspected thyrotoxicosis.  He was placed on beta-blockers, methimazole, steroids but rapidly deteriorated 2/27 and went to respiratory arrest with V-fib arrest.  Underwent 40 minutes of CPR followed by ROSC.  Post arrest course complicated by ATN requiring CRRT now on HD, he was also on ECMO status post decannulation on 3/8, also required Impella eventually removed 3/12.  He failed extubation x 1 due to mucous plugging, but eventually extubated 3/16.  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  Significant events: 2/26 admitted +PE 2/27 Vtac/ Vfib arrest> rosc after 40 mins, s/p TNK,  VA ecmo 3/5 salvage TEER (transcatheter edge to edge mitral valve repair) 3/7 weaned off VA 3/8 bronch for mucus plugging, VA ecmo decannulation >impella 5.5 3/12 impella removed, extubation trial 2/28 CRRT 3/13 reintubated, bronch for recurrent plugging, HD cath rewired due to malfunction 3/15 CRRT stopped, remains anuric 3/16 awake f/c, extubated  Subjective / 24h Interval events: Awake this morning, alert.  No chest pain, no nausea or vomiting.  Has been eating more  Assesement and Plan: Principal Problem:   Bilateral pulmonary embolism (HCC) Active Problems:   Paroxysmal atrial fibrillation (HCC)   Hyperthyroidism   Chronic heart failure with preserved ejection fraction (HFpEF) (HCC)   Severe mitral regurgitation   Cardiac arrest (HCC)   Cardiogenic shock (HCC)   Hyperkalemia   On mechanically assisted ventilation (HCC)   Acute respiratory failure with hypoxia (HCC)    Palliative care by specialist   Protein-calorie malnutrition, severe  Principal problem Cardiogenic shock - Suspect respiratory failure was mainly due to PE in the setting of mitral regurgitation and possibly thyrotoxicosis.  Suspected downtime about 20 minutes.  -Cardiology consulted and following while hospitalized, underwent ECMO with Impella, decannulated on 3/8 and Impella removed and extubated on 3/12, had to be re-intubated but eventually extubated again on 3/16 -he required inotropic support, now off -Stable to transfer out of the ICU  Active problems Acute kidney injury due to ATN-following cardiac arrest.  Required CRRT, now on IHD.  Nephrology consulted.,  Appreciate follow-up, tolerating IHD.  No apparent renal recovery yet, continue dialysis, will undergo session today  Acute systolic CHF, severe mitral valve regurgitation -cardiology following, advanced heart failure following.  Due to frequent PVCs and SVT has been placed on amiodarone. -Now status post mitral transcatheter edge-to-edge repair (mTEER) on 07/16/2023 with placement of 3 clips and improvement in MR from severe to mild -Volume status as per dialysis/nephrology  Acute respiratory failure with hypoxia, pulmonary hypertension, acute PE -has improved significantly, currently tolerating IV heparin and has been weaned off to room air.  PAF-continue amiodarone, anticoagulation  PE-on heparin  Hyperthyroidism/possibly thyrotoxicosis - Continue methimazole. Need to check TSH in 4 weeks.  Last TSH was checked on 3/14 showed some improvement at 0.03   ID  -previously on Vanco meropenem and micafungin, now off antibiotics.  Cultures without growth.  Remains afebrile   Normocytic anemia -Hemoglobin stable   Acute small right CVA  - MRI of the head on 07/24/2022 shows marked fluid to the right cerebellar and cerebral hemisphere. Suspect embolic, continue  IV heparin.  Nutrition/moderate protein caloric malnutrition - Continue  tube feedings.  RD following, advance diet as tolerated   Deconditioning -unclear his posthospitalization needs   Stage II sacral cubitus ulcer -noted  Scheduled Meds:  amiodarone  200 mg Oral Daily   arformoterol  15 mcg Nebulization BID   ascorbic acid  250 mg Oral BID   aspirin  81 mg Oral Daily   budesonide (PULMICORT) nebulizer solution  0.25 mg Nebulization BID   Chlorhexidine Gluconate Cloth  6 each Topical Q0600   darbepoetin (ARANESP) injection - DIALYSIS  200 mcg Subcutaneous Q Fri-1800   feeding supplement (PROSource TF20)  60 mL Per Tube BID   fiber supplement (BANATROL TF)  60 mL Oral QID   folic acid  1 mg Oral Daily   insulin aspart  0-20 Units Subcutaneous Q4H   methimazole  10 mg Oral Daily   mexiletine  250 mg Oral Q12H   multivitamin  1 tablet Oral QHS   mouth rinse  15 mL Mouth Rinse 4 times per day   revefenacin  175 mcg Nebulization Daily   sodium chloride flush  3 mL Intravenous Q12H   thiamine  100 mg Oral Daily   Or   thiamine  100 mg Intravenous Daily   zinc sulfate (50mg  elemental zinc)  220 mg Oral Daily   Continuous Infusions:  feeding supplement (VITAL 1.5 CAL) 75 mL/hr at 08/01/23 0600   heparin 1,900 Units/hr (08/01/23 0600)   PRN Meds:.acetaminophen, chlorpheniramine-HYDROcodone, Gerhardt's butt cream, HYDROmorphone (DILAUDID) injection, labetalol, lactulose, [COMPLETED] loperamide HCl **FOLLOWED BY** loperamide HCl, ondansetron (ZOFRAN) IV, mouth rinse, oxyCODONE, polyethylene glycol  Current Outpatient Medications  Medication Instructions   apixaban (ELIQUIS) 5 mg, Oral, 2 times daily   bismuth subsalicylate (PEPTO BISMOL) 524 mg, As needed   methimazole (TAPAZOLE) 10 mg, Oral, Every 8 hours   Metoprolol Tartrate 37.5 mg, Oral, 2 times daily   spironolactone (ALDACTONE) 25 mg, Oral, Daily    Diet Orders (From admission, onward)     Start     Ordered   07/29/23 1408  DIET DYS 3 Room service appropriate? Yes with Assist; Fluid  consistency: Honey Thick  Diet effective now       Question Answer Comment  Room service appropriate? Yes with Assist   Fluid consistency: Honey Thick      07/29/23 1408            DVT prophylaxis: Heparin infusion   Lab Results  Component Value Date   PLT 432 (H) 08/01/2023      Code Status: Full Code  Family Communication: Family at bedside  Status is: Inpatient Remains inpatient appropriate because: Severity of illness  Level of care: Progressive  Consultants:  Cardiology Nephrology  Objective: Vitals:   08/01/23 1000 08/01/23 1021 08/01/23 1023 08/01/23 1030  BP: (!) 151/100 (!) 140/90  (!) 143/93  Pulse: 76 71  70  Resp: (!) 28 (!) 27  (!) 23  Temp:  99.5 F (37.5 C)    TempSrc:  Oral    SpO2: 99% 100%    Weight:   78.5 kg   Height:        Intake/Output Summary (Last 24 hours) at 08/01/2023 1044 Last data filed at 08/01/2023 0946 Gross per 24 hour  Intake 2015.59 ml  Output 415 ml  Net 1600.59 ml   Wt Readings from Last 3 Encounters:  08/01/23 78.5 kg  03/27/23 95.3 kg  03/20/23 89.2 kg    Examination:  Constitutional:  NAD Eyes: lids and conjunctivae normal, no scleral icterus ENMT: mmm Neck: normal, supple Respiratory: clear to auscultation bilaterally, no wheezing, no crackles.  Cardiovascular: Regular rate and rhythm, no murmurs / rubs / gallops. No LE edema. Abdomen: soft, no distention, no tenderness. Bowel sounds positive.    Data Reviewed: I have independently reviewed following labs and imaging studies   CBC Recent Labs  Lab 07/27/23 0500 07/27/23 1608 07/28/23 0512 07/29/23 0427 07/30/23 1430 08/01/23 0418  WBC 20.9*  --  18.4* 17.6* 14.1* 9.9  HGB 7.6* 11.9* 7.6* 8.3* 8.6* 8.4*  HCT 24.2* 35.0* 24.2* 26.2* 27.1* 26.2*  PLT 212  --  307 357 433* 432*  MCV 96.0  --  94.9 94.2 94.8 94.6  MCH 30.2  --  29.8 29.9 30.1 30.3  MCHC 31.4  --  31.4 31.7 31.7 32.1  RDW 20.5*  --  20.6* 19.9* 19.9* 19.0*    Recent Labs   Lab 07/26/23 0437 07/26/23 0443 07/27/23 0500 07/27/23 1503 07/27/23 1608 07/28/23 0512 07/29/23 0427 07/30/23 0500 07/31/23 0359 08/01/23 0418  NA 134*  133*   < > 137  133* 134* 135 133* 132* 131*  --  129*  K 3.9  3.8   < > 4.4  4.3 4.4 4.5 4.6 4.6 4.6  --  5.3*  CL 100  101   < > 102  102 102  --  103 97* 95*  --  95*  CO2 21*  22   < > 17*  20* 16*  --  18* 21* 19*  --  21*  GLUCOSE 150*  153*   < > 132*  136* 148*  --  170* 151* 170*  --  156*  BUN 47*  46*   < > 75*  75* 90*  --  116* 90* 130*  --  117*  CREATININE 3.39*  3.24*   < > 5.69*  5.80* 7.40*  --  8.93* 7.20* 9.41*  --  9.47*  CALCIUM 8.2*  8.0*   < > 8.0*  7.8* 7.9*  --  7.9* 7.7* 7.9*  --  7.6*  AST 49*  --  46*  --   --  42* 396* 60* 46* 111*  ALT 25  --  27  --   --  26 147* 72* 49* 128*  ALKPHOS 128*  --  118  --   --  115 500* 282* 243* 494*  BILITOT 1.0  --  1.0  --   --  1.1 0.9 0.9 0.7 0.8  ALBUMIN 2.2*  2.1*   < > 2.2*  2.1* 2.1*  --  2.1*  2.1* 2.2* 2.1*  2.1* 2.2* 2.2*  MG 2.2  --  2.3  --   --  2.6* 2.3  --   --  2.3   < > = values in this interval not displayed.    ------------------------------------------------------------------------------------------------------------------ No results for input(s): "CHOL", "HDL", "LDLCALC", "TRIG", "CHOLHDL", "LDLDIRECT" in the last 72 hours.  Lab Results  Component Value Date   HGBA1C 4.8 07/10/2023   ------------------------------------------------------------------------------------------------------------------ No results for input(s): "TSH", "T4TOTAL", "T3FREE", "THYROIDAB" in the last 72 hours.  Invalid input(s): "FREET3"  Cardiac Enzymes No results for input(s): "CKMB", "TROPONINI", "MYOGLOBIN" in the last 168 hours.  Invalid input(s): "CK" ------------------------------------------------------------------------------------------------------------------    Component Value Date/Time   BNP 980.5 (H) 07/10/2023 1224     CBG: Recent Labs  Lab 07/31/23 1514 07/31/23 2009 08/01/23 0021 08/01/23 0423 08/01/23 0829  GLUCAP 186*  139* 87 131* 142*    Recent Results (from the past 240 hours)  Surgical PCR screen     Status: None   Collection Time: 07/23/23 12:34 AM   Specimen: Nasal Mucosa; Nasal Swab  Result Value Ref Range Status   MRSA, PCR NEGATIVE NEGATIVE Final   Staphylococcus aureus NEGATIVE NEGATIVE Final    Comment: (NOTE) The Xpert SA Assay (FDA approved for NASAL specimens in patients 24 years of age and older), is one component of a comprehensive surveillance program. It is not intended to diagnose infection nor to guide or monitor treatment. Performed at [facility], [address] [place], [place] [address]   Culture, Respiratory w Gram Stain     Status: None   Collection Time: 07/24/23  5:17 AM   Specimen: Tracheal Aspirate; Respiratory  Result Value Ref Range Status   Specimen Description TRACHEAL ASPIRATE  Final   Special Requests NONE  Final   Gram Stain NO WBC SEEN NO ORGANISMS SEEN   Final   Culture   Final    FEW Normal respiratory flora-no Staph aureus or Pseudomonas seen Performed at [facility], [address], [place], [place] [address]    Report Status 07/26/2023 FINAL  Final     Radiology Studies: No results found.   Pamella Pert, MD, PhD [facility]  Between 7 am - 7 pm I am available, please contact me via Amion (for emergencies) or Securechat (non urgent messages)  Between 7 pm - 7 am I am not available, please contact night coverage MD/APP via Amion

## 2023-08-01 NOTE — Progress Notes (Signed)
 FMLA forms for pts sister, Aaron [name], completed, signed by Dr Elwyn Lade and faxed to [employer] at [phone], Shanda Bumps is aware and copy also emailed per her request to [email or web address]

## 2023-08-01 NOTE — Progress Notes (Signed)
 Pt completed HD treatment without issues.   08/01/23 1400  Vitals  Temp 99.5 F (37.5 C)  Temp Source Oral  BP 131/86  BP Location Left Arm  BP Method Automatic  Patient Position (if appropriate) Lying  Pulse Rate 84  ECG Heart Rate 84  Resp (!) 24  Oxygen Therapy  SpO2 100 %  O2 Device Room Air  During Treatment Monitoring  Blood Flow Rate (mL/min) 399 mL/min  Arterial Pressure (mmHg) -173.93 mmHg  Venous Pressure (mmHg) 158.98 mmHg  TMP (mmHg) 2.22 mmHg  Ultrafiltration Rate (mL/min) 763 mL/min  Dialysate Flow Rate (mL/min) 299 ml/min  Duration of HD Treatment -hour(s) 2.97 hour(s)  Cumulative Fluid Removed (mL) per Treatment  721.85  HD Safety Checks Performed Yes  Intra-Hemodialysis Comments Tx completed  Post Treatment  Dialyzer Clearance Clotted  Hemodialysis Intake (mL) 0 mL  Liters Processed 74  Fluid Removed (mL) 1000 mL  Tolerated HD Treatment Yes  Post-Hemodialysis Comments Pt tolerated TX well  Hemodialysis Catheter Left Subclavian  Placement Date/Time: 07/19/23 1051   Placed prior to admission: No  Time Out: Correct patient;Correct site;Correct procedure  Maximum sterile barrier precautions: Mask;Sterile gown;Sterile probe cover;Large sterile sheet;Cap;Hand hygiene;Sterile glove...  Site Condition No complications  Blue Lumen Status Heparin locked  Red Lumen Status Heparin locked  Catheter fill solution Heparin 1000 units/ml  Catheter fill volume (Arterial) 1.4 cc  Catheter fill volume (Venous) 1.4  Dressing Type Transparent  Dressing Status Clean, Dry, Intact  Interventions New dressing  Drainage Description None  Dressing Change Due 08/06/23  Post treatment catheter status Capped and Clamped

## 2023-08-01 NOTE — Progress Notes (Addendum)
 Physical Therapy Treatment Patient Details Name: Aaron [name] MRN: [id number] DOB: 08/26/1985 Today's Date: 08/01/2023   History of Present Illness 38 y.o. male who presented to the ED on 2/26 for evaluation of chest pain and dyspnea. CTA revealed semental B lower lobe PE. 2/27 had VT/VF arrest, requiring of CPR and multiple shocks. 2/27-3/8 [facility] ECMO with impella removed on 3/12. 3/13 MRI showed acute R hemisphere strokes. 2/27-3/12 intubation with following reintubation from 3/13-3/16. 2/28- 3/15 CRRT. PMH: PAF not adherent to Eliquis, chronic HFpEF, severe mitral regurgitation, hyperthyroidism     PT Comments  Pt in bed upon arrival and agreeable to PT session. Pt progressed in today's session by being able to work on gait training. Pt required ModA to move from supine/sit and MinA to stand with a RW. Pt was able to ambulate ~16 ft with a RW and MinA due to decreased balance. Pt was mildly unsteady and would drift left/right with cues for RW management and navigation. Pt is progressing well towards goals. Pt would continue to benefit from >3hrs post acute rehab to work towards independence with mobility. Anticipate pt will continue to progress well with continued therapy. Acute PT to follow.    BP 149/92, 75 BPM SpO2 99% on RA, 33 RR   If plan is discharge home, recommend the following: Assistance with cooking/housework;Assist for transportation;Help with stairs or ramp for entrance;A little help with walking and/or transfers;A little help with bathing/dressing/bathroom   Can travel by private vehicle      Yes  Equipment Recommendations  Rolling walker (2 wheels);Wheelchair (measurements PT);Wheelchair cushion (measurements PT);BSC/3in1       Precautions / Restrictions Precautions Precautions: Fall Precaution/Restrictions Comments: cortrak Restrictions Weight Bearing Restrictions Per Provider Order: No     Mobility  Bed Mobility Overal bed mobility: Needs Assistance Bed  Mobility: Supine to Sit    Supine to sit: HOB elevated, Used rails, Mod assist    General bed mobility comments: quick movements of LE out of bed. ModA to raise trunk    Transfers Overall transfer level: Needs assistance Equipment used: Rolling walker (2 wheels) Transfers: Sit to/from Stand Sit to Stand: Min assist    General transfer comment: MinA to boost up and steady with use of RW, cues for hand placement    Ambulation/Gait Ambulation/Gait assistance: Min assist Gait Distance (Feet): 16 Feet Assistive device: Rolling walker (2 wheels) Gait Pattern/deviations: Step-through pattern, Decreased step length - right, Decreased step length - left, Drifts right/left Gait velocity: decr     General Gait Details: pt takes short and choppy steps with MinA required to steady. Drifts L/R with cues for how to navigate RW and when to turn      Balance Overall balance assessment: Needs assistance Sitting-balance support: Feet unsupported, Bilateral upper extremity supported Sitting balance-Leahy Scale: Fair     Standing balance support: Bilateral upper extremity supported, Single extremity supported, During functional activity Standing balance-Leahy Scale: Poor Standing balance comment: reliant on RW and external support       Communication Communication Communication: Impaired Factors Affecting Communication: Reduced clarity of speech  Cognition Arousal: Alert Behavior During Therapy: Flat affect, WFL for tasks assessed/performed   PT - Cognitive impairments: No apparent impairments      Following commands: Intact Following commands impaired: Follows multi-step commands with increased time, Only follows one step commands consistently    Cueing Cueing Techniques: Verbal cues, Tactile cues, Gestural cues  Exercises General Exercises - Lower Extremity Long Arc Quad: AROM, Both, 10  reps, Seated Hip Flexion/Marching: AROM, Both, 10 reps, Seated        Pertinent  Vitals/Pain Pain Assessment Pain Assessment: No/denies pain     PT Goals (current goals can now be found in the care plan section) Acute Rehab PT Goals Patient Stated Goal: home PT Goal Formulation: With patient/family Time For Goal Achievement: 08/10/23 Potential to Achieve Goals: Good Progress towards PT goals: Progressing toward goals    Frequency    Min 2X/week          AM-PAC PT "6 Clicks" Mobility   Outcome Measure  Help needed turning from your back to your side while in a flat bed without using bedrails?: A Little Help needed moving from lying on your back to sitting on the side of a flat bed without using bedrails?: A Lot Help needed moving to and from a bed to a chair (including a wheelchair)?: A Little Help needed standing up from a chair using your arms (e.g., wheelchair or bedside chair)?: A Little Help needed to walk in hospital room?: A Little Help needed climbing 3-5 steps with a railing? : Total 6 Click Score: 15    End of Session   Activity Tolerance: Patient tolerated treatment well Patient left: in chair;with call bell/phone within reach Nurse Communication: Mobility status (RN present during session) PT Visit Diagnosis: Unsteadiness on feet (R26.81);Muscle weakness (generalized) (M62.81);Difficulty in walking, not elsewhere classified (R26.2)     Time: 7829-5621 PT Time Calculation (min) (ACUTE ONLY): 19 min  Charges:    $Gait Training: 8-22 mins PT General Charges $$ ACUTE PT VISIT: 1 Visit                     Hilton Cork, PT, DPT Secure Chat Preferred  Rehab Office [phone]   Arturo Morton Brion Aliment 08/01/2023, 10:44 AM

## 2023-08-01 NOTE — Progress Notes (Signed)
2D echo attempted, patient unavailable. Will try later 

## 2023-08-01 NOTE — Progress Notes (Signed)
 IP rehab admissions - We are opening the case with insurance carrier today given patient progress.  Please see consult note from Dr. Shearon Stalls today.  I will have a partner follow up once we hear back from insurance case manager.  [phone]

## 2023-08-01 NOTE — Progress Notes (Signed)
 Nutrition Follow-up  DOCUMENTATION CODES:   Severe malnutrition in context of acute illness/injury  INTERVENTION:   Calorie Count to better assess oral intake; pt eating lots of applesauce but unclear if pt eating much else.   Magic cup with meals, each supplement provides 290 kcal and 9 grams of protein  Tube Feeding via Cortrak: trial change to Nepro to see if electrolytes improve. If stool frequency increases or stool consitency becomes more liquid, recommend changing back to Vital  Nepro at 60 ml/hr Pro-Source TF 20 60 mL TID TF at goal provides 177 g of protein, 2832 kcals, 1051 mL of free water  Continue Banatrol TF  Continue Renal MVI Folic Acid and Thiamine   Vitamin C 250 mg BID daily x 30 days and Zinc Sulfate 220 mg daily x 14 days given increased risk for deficiency and increased needs for wound healing    NUTRITION DIAGNOSIS:   Severe Malnutrition related to acute illness as evidenced by moderate fat depletion, moderate muscle depletion.  Being addressed via TF, po diet  GOAL:   Patient will meet greater than or equal to 90% of their needs  Progressing  MONITOR:   TF tolerance, Vent status, Labs, Weight trends, Skin  REASON FOR ASSESSMENT:   Consult Enteral/tube feeding initiation and management (Trickle feeds)  ASSESSMENT:   38 y.o male presented with left-sided chest pain and shortness of breath, diagnosed with PE, coded and was intubated after 40 minutes of CPR. Subsequently ECMO started. PMH of PAF, chronic HFpEF, severe mitral regurgitation, hyperthyroidism. Noncompliance with medicines.  2/26 Admitted 2/27 V.tach/V.fib cardiac arrest, intubated, 40 minutes of CPR, tx to [facility], placed on [facility] ECMO 2/28 CRRT initiated via ECMO circuit, Citrate Protocol 3/01 Vital High Protein started at 20 ml/hr by MD 3/02 MD started titration of TF 3/03 OR: Exchanged Impella CP for impella 5.5, Cortrak  3/05 OR: transcatheter edge to edge mitral valve repair 3/07  OR: Decannulation from [facility] ECMO 3/10 ECHO with EF 35-40% 3/12 OR: Impella Removed, Extubated 3/13 Re-Intubated early AM on night shift, Bronch, MRI brain with small strokes (likely embolic) 3/14 Cortrak placed 1/61 CRRT discontinued 3/16 Extubated 3/17 1st iHD with no UF due to high stool output; diet advanced to Dysphagia 3, honey thick 3/21 Switch to Nepro  iHD today with 1L net UF. Off inotrope support UOP 415 mL in 24 hours  Pt continues to eat lots of applesauce per RN. Pt does have an appetite, awaiting dinner tray currently. Pt missed meals today due to iHD.  Pt remains on Dysphagia 3, Honey Thick diet. SLP continues to follow Limited documentation of po intake, 2 meals recorded at 25%  +3 stools in 24 hours, documented at type 6. Receiving banatrol QID and imodium prn. Pt previously experiencing significant diarrhea.  Tolerating TF at goal via Cortrak  Noted electrolyte abnormalities prior to iHD today; hyponatremia, mild hyperkalemia, mild hyperphosphatemia. BUN 117, Creatinine 9.5  Current wt 77.5 kg, lowest wt 74 kg.   Labs: Sodium 129 (L) Potassium 5.3 (H) Phosphorus 5.8 (H) BUN 117 Creatinine 9.47 Alk Phos 494 (H), elevated LFTs T.Bili 0.8  Meds:  reviewed   Diet Order:   Diet Order             Diet NPO time specified Except for: Sips with Meds  Diet effective midnight           DIET DYS 3 Room service appropriate? Yes with Assist; Fluid consistency: Honey Thick  Diet effective now  EDUCATION NEEDS:   Not appropriate for education at this time  Skin:  Skin Assessment: Skin Integrity Issues: Skin Integrity Issues:: Stage II Stage II: sacrum Wound Vac: n/a Incisions: L axilla (closed), R groing-healing well per vascular surgery  Last BM:  3/21 type 6 large  Height:   Ht Readings from Last 1 Encounters:  07/09/23 6\' 4"  (1.93 m)    Weight:   Wt Readings from Last 1 Encounters:  08/01/23 77.5 kg    Ideal Body Weight:   91.8 kg  BMI:  Body mass index is 20.8 kg/m.  Estimated Nutritional Needs:   Kcal:  2700-3000 kcals  Protein:  190-210 gm  Fluid:  >2L/day   Romelle Starcher MS, RDN, LDN, CNSC Registered Dietitian 3 Clinical Nutrition RD Inpatient Contact Info in Amion

## 2023-08-01 NOTE — Progress Notes (Signed)
 [facility] NEPHROLOGY PROGRESS NOTE  Assessment/ Plan: Pt is a 38 y.o. yo male  likely ATN after cardiac arrest, cardiogenic shock on ECMO ( decan 3/8) and Impella ( removed 3/12), AHRF/VDRF   # Dialysis dependent anuric AKI 2/2 ATN from cardiogenic shock on CRRT since 07/11/23, Left subclavian temp HD cath placed by CCM;  Tolerating CRRT well however getting interruptions because of procedures, imaging studies as well as clotting.  The potassium level was running high --  CRRT prescription was changed, then K in the 3's-  change again to 4 K pre and post and cont 2 K dialysate-  UF around 50 / hour-  volume does not seem terrible.   Clotting was an issue.  CRRT was stopped on 3/15.  No UOP and numbers rising.  Tolerated IHD 3/17; 3/19 (1.7L)  no signs of recovery and we will plan on next treatment today (Lt SCV temp), orders already written. Changed to 2K bath.   Bladder scan only on 3/17 -> UOP (may be picking up).  Plan on HD Fri; will maintain on MWF regimen  + daily evaluation for any signs of renal recovery.   # Cardiogenic Shock,  Impella removed; status post transcutaneous mitral valve repair; previously on ECMO. per cardiology.  # VT + PEA SCA, as per cardiology team. # PE on heparin per CCM # Anemia, Hb  dropping--  added ESA, supportive care # Hyperkalemia, less present .  Off  CRRT 3/15 now on iHD. # Severe MR s/p TEER 3/5   Subjective:  Extubated, awake and appropriate; fianc bedside today.  UOP 446ml/24hr.  CRRT stopped 3/15. Tolerated iHD on Wed. Some nausea but no cramping  Objective Vital signs in last 24 hours: Vitals:   08/01/23 0530 08/01/23 0600 08/01/23 0744 08/01/23 0800  BP: (!) 164/78 (!) 144/99  129/71  Pulse: 74 96 83   Resp: (!) 31 (!) 28 (!) 26   Temp:      TempSrc:      SpO2: 97% 98%    Weight:      Height:       Weight change: 0.7 kg  Intake/Output Summary (Last 24 hours) at 08/01/2023 0917 Last data filed at 08/01/2023  0600 Gross per 24 hour  Intake 2106.59 ml  Output 415 ml  Net 1691.59 ml       Labs: RENAL PANEL Recent Labs  Lab 07/26/23 0437 07/26/23 0443 07/27/23 0500 07/27/23 1503 07/27/23 1608 07/28/23 0512 07/29/23 0427 07/30/23 0500 07/31/23 0359 08/01/23 0418  NA 134*  133*   < > 137  133* 134* 135 133* 132* 131*  --  129*  K 3.9  3.8   < > 4.4  4.3 4.4 4.5 4.6 4.6 4.6  --  5.3*  CL 100  101   < > 102  102 102  --  103 97* 95*  --  95*  CO2 21*  22   < > 17*  20* 16*  --  18* 21* 19*  --  21*  GLUCOSE 150*  153*   < > 132*  136* 148*  --  170* 151* 170*  --  156*  BUN 47*  46*   < > 75*  75* 90*  --  116* 90* 130*  --  117*  CREATININE 3.39*  3.24*   < > 5.69*  5.80* 7.40*  --  8.93* 7.20* 9.41*  --  9.47*  CALCIUM 8.2*  8.0*   < >  8.0*  7.8* 7.9*  --  7.9* 7.7* 7.9*  --  7.6*  MG 2.2  --  2.3  --   --  2.6* 2.3  --   --  2.3  PHOS 3.8  3.8   < > 4.5  4.5 4.1  --  4.3 5.0* 5.3*  --  5.8*  ALBUMIN 2.2*  2.1*   < > 2.2*  2.1* 2.1*  --  2.1*  2.1* 2.2* 2.1*  2.1* 2.2* 2.2*   < > = values in this interval not displayed.    Liver Function Tests: Recent Labs  Lab 07/30/23 0500 07/31/23 0359 08/01/23 0418  AST 60* 46* 111*  ALT 72* 49* 128*  ALKPHOS 282* 243* 494*  BILITOT 0.9 0.7 0.8  PROT 6.1* 6.4* 6.0*  ALBUMIN 2.1*  2.1* 2.2* 2.2*   No results for input(s): "LIPASE", "AMYLASE" in the last 168 hours. No results for input(s): "AMMONIA" in the last 168 hours.  CBC: Recent Labs    07/27/23 1608 07/28/23 0512 07/29/23 0427 07/30/23 1430 08/01/23 0418  HGB 11.9* 7.6* 8.3* 8.6* 8.4*  MCV  --  94.9 94.2 94.8 94.6    Cardiac Enzymes: No results for input(s): "CKTOTAL", "CKMB", "CKMBINDEX", "TROPONINI" in the last 168 hours. CBG: Recent Labs  Lab 07/31/23 1514 07/31/23 2009 08/01/23 0021 08/01/23 0423 08/01/23 0829  GLUCAP 186* 139* 87 131* 142*    Iron Studies: No results for input(s): "IRON", "TIBC", "TRANSFERRIN", "FERRITIN" in the  last 72 hours. Studies/Results: DG Abd 1 View Result Date: 07/31/2023 CLINICAL DATA:  Feeding tube placement. EXAM: ABDOMEN - 1 VIEW COMPARISON:  Abdominal radiograph dated 07/24/2023. FINDINGS: Feeding tube with tip in the region of the distal stomach. IMPRESSION: Feeding tube with tip in the distal stomach. Electronically Signed   By: Elgie Collard M.D.   On: 07/31/2023 09:37   DG CHEST PORT 1 VIEW Result Date: 07/31/2023 CLINICAL DATA:  Dialysis catheter dysfunction. EXAM: PORTABLE CHEST 1 VIEW COMPARISON:  Chest radiograph dated 07/25/2023. FINDINGS: Interval removal of the right-sided PICC and left IJ central venous line. Left subclavian catheter with tip over upper SVC. Feeding tube extends below the diaphragm with tip beyond the inferior margin of the image. No significant interval change in bilateral pulmonary opacities and small left pleural effusion. No pneumothorax. Stable cardiac silhouette. No acute osseous pathology. IMPRESSION: 1. Interval removal of the right-sided PICC and left IJ central venous line. 2. No significant interval change in bilateral pulmonary opacities and small left pleural effusion. Electronically Signed   By: Elgie Collard M.D.   On: 07/31/2023 09:37     Medications: Infusions:  feeding supplement (VITAL 1.5 CAL) 75 mL/hr at 08/01/23 0600   heparin 1,900 Units/hr (08/01/23 0600)    Scheduled Medications:  amiodarone  200 mg Oral Daily   arformoterol  15 mcg Nebulization BID   ascorbic acid  250 mg Oral BID   aspirin  81 mg Oral Daily   budesonide (PULMICORT) nebulizer solution  0.25 mg Nebulization BID   Chlorhexidine Gluconate Cloth  6 each Topical Q0600   darbepoetin (ARANESP) injection - DIALYSIS  200 mcg Subcutaneous Q Fri-1800   feeding supplement (PROSource TF20)  60 mL Per Tube BID   fiber supplement (BANATROL TF)  60 mL Oral QID   folic acid  1 mg Oral Daily   insulin aspart  0-20 Units Subcutaneous Q4H   methimazole  10 mg Oral Daily    mexiletine  250 mg Oral Q12H  multivitamin  1 tablet Oral QHS   mouth rinse  15 mL Mouth Rinse 4 times per day   revefenacin  175 mcg Nebulization Daily   sodium chloride flush  3 mL Intravenous Q12H   thiamine  100 mg Oral Daily   Or   thiamine  100 mg Intravenous Daily   zinc sulfate (50mg  elemental zinc)  220 mg Oral Daily    have reviewed scheduled and prn medications.  Physical Exam: General: Critically ill looking male, alert  Heart:RRR, s1s2 nl Lungs: rhonchi. Abdomen:soft, non-distended Extremities:No edema Dialysis Access: Left subclavian temporary HD line.-  newest line placed 3/9  Aaron [name] W 08/01/2023,9:17 AM  LOS: 23 days

## 2023-08-02 DIAGNOSIS — R570 Cardiogenic shock: Secondary | ICD-10-CM | POA: Diagnosis not present

## 2023-08-02 DIAGNOSIS — E872 Acidosis, unspecified: Secondary | ICD-10-CM | POA: Diagnosis not present

## 2023-08-02 DIAGNOSIS — E875 Hyperkalemia: Secondary | ICD-10-CM | POA: Diagnosis not present

## 2023-08-02 DIAGNOSIS — I2699 Other pulmonary embolism without acute cor pulmonale: Secondary | ICD-10-CM | POA: Diagnosis not present

## 2023-08-02 DIAGNOSIS — R079 Chest pain, unspecified: Secondary | ICD-10-CM | POA: Diagnosis not present

## 2023-08-02 DIAGNOSIS — J9601 Acute respiratory failure with hypoxia: Secondary | ICD-10-CM | POA: Diagnosis not present

## 2023-08-02 DIAGNOSIS — R0602 Shortness of breath: Secondary | ICD-10-CM | POA: Diagnosis not present

## 2023-08-02 DIAGNOSIS — I469 Cardiac arrest, cause unspecified: Secondary | ICD-10-CM | POA: Diagnosis not present

## 2023-08-02 DIAGNOSIS — N170 Acute kidney failure with tubular necrosis: Secondary | ICD-10-CM | POA: Diagnosis not present

## 2023-08-02 LAB — BASIC METABOLIC PANEL
Anion gap: 13 (ref 5–15)
BUN: 83 mg/dL — ABNORMAL HIGH (ref 6–20)
CO2: 23 mmol/L (ref 22–32)
Calcium: 8 mg/dL — ABNORMAL LOW (ref 8.9–10.3)
Chloride: 94 mmol/L — ABNORMAL LOW (ref 98–111)
Creatinine, Ser: 7.7 mg/dL — ABNORMAL HIGH (ref 0.61–1.24)
GFR, Estimated: 9 mL/min — ABNORMAL LOW (ref >60)
Glucose, Bld: 132 mg/dL — ABNORMAL HIGH (ref 70–99)
Potassium: 5.1 mmol/L (ref 3.5–5.1)
Sodium: 130 mmol/L — ABNORMAL LOW (ref 135–145)

## 2023-08-02 LAB — CBC
HCT: 27.3 % — ABNORMAL LOW (ref 39.0–52.0)
Hemoglobin: 8.8 g/dL — ABNORMAL LOW (ref 13.0–17.0)
MCH: 30.3 pg (ref 26.0–34.0)
MCHC: 32.2 g/dL (ref 30.0–36.0)
MCV: 94.1 fL (ref 80.0–100.0)
Platelets: 398 10*3/uL (ref 150–400)
RBC: 2.9 MIL/uL — ABNORMAL LOW (ref 4.22–5.81)
RDW: 18.4 % — ABNORMAL HIGH (ref 11.5–15.5)
WBC: 8.5 10*3/uL (ref 4.0–10.5)
nRBC: 0 % (ref 0.0–0.2)

## 2023-08-02 LAB — GLUCOSE, CAPILLARY
Glucose-Capillary: 129 mg/dL — ABNORMAL HIGH (ref 70–99)
Glucose-Capillary: 122 mg/dL — ABNORMAL HIGH (ref 70–99)
Glucose-Capillary: 129 mg/dL — ABNORMAL HIGH (ref 70–99)
Glucose-Capillary: 108 mg/dL — ABNORMAL HIGH (ref 70–99)
Glucose-Capillary: 113 mg/dL — ABNORMAL HIGH (ref 70–99)

## 2023-08-02 LAB — HEPARIN LEVEL (UNFRACTIONATED): Heparin Unfractionated: 0.34 [IU]/mL (ref 0.30–0.70)

## 2023-08-02 MED ORDER — INSULIN ASPART 100 UNIT/ML IJ SOLN: 0.0000 [IU] | Freq: Three times a day (TID) | INTRAMUSCULAR | Status: DC

## 2023-08-02 NOTE — Progress Notes (Signed)
 PROGRESS NOTE  Aaron [name] WGN:[id number] DOB: January 15, 1986 DOA: 07/09/2023 PCP: Patient, No Pcp Per   LOS: 24 days   Brief Narrative / Interim history: 38 year old male with hypothyroidism, PAF on anticoagulation (per notes nonadherent to Eliquis), severe mitral regurgitation comes into the hospital on 2/26 with shortness of breath, found to have a PE and A-fib with RVR, started on heparin, he was also suspected thyrotoxicosis.  He was placed on beta-blockers, methimazole, steroids but rapidly deteriorated 2/27 and went to respiratory arrest with V-fib arrest.  Underwent 40 minutes of CPR followed by ROSC.  Post arrest course complicated by ATN requiring CRRT now on HD, he was also on ECMO status post decannulation on 3/8, also required Impella eventually removed 3/12.  He failed extubation x 1 due to mucous plugging, but eventually extubated 3/16.  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  Significant events: 2/26 admitted +PE 2/27 Vtac/ Vfib arrest> rosc after 40 mins, s/p TNK,  VA ecmo 3/5 salvage TEER (transcatheter edge to edge mitral valve repair) 3/7 weaned off VA 3/8 bronch for mucus plugging, VA ecmo decannulation >impella 5.5 3/12 impella removed, extubation trial 2/28 CRRT 3/13 reintubated, bronch for recurrent plugging, HD cath rewired due to malfunction 3/15 CRRT stopped, remains anuric 3/16 awake f/c, extubated 3/22 core track discontinued  Subjective / 24h Interval events: Awake, alert, complains of his feeding tube being bothersome.  Patient tells me he ate 3 meals yesterday, and is able to do so without difficulties.  His main complaint is the thickened liquids but has no problems with eating.  Family is at bedside and confirmed good p.o. intake  Assesement and Plan: Principal Problem:   Bilateral pulmonary embolism (HCC) Active Problems:   Paroxysmal atrial fibrillation (HCC)   Hyperthyroidism   Chronic heart failure with preserved  ejection fraction (HFpEF) (HCC)   Severe mitral regurgitation   Cardiac arrest (HCC)   Cardiogenic shock (HCC)   Hyperkalemia   On mechanically assisted ventilation (HCC)   Acute respiratory failure with hypoxia (HCC)   Palliative care by specialist   Protein-calorie malnutrition, severe  Principal problem Cardiogenic shock - Suspect respiratory failure was mainly due to PE in the setting of mitral regurgitation and possibly thyrotoxicosis.  Suspected downtime about 20 minutes.  -Cardiology consulted and following while hospitalized, underwent ECMO with Impella, decannulated on 3/8 and Impella removed and extubated on 3/12, had to be re-intubated but eventually extubated again on 3/16 -he required inotropic support, now off -Stable to transfer out of the ICU, repeat 2D echo yesterday shows slight improvement in his EF, now 40-45%  Active problems Acute kidney injury due to ATN-following cardiac arrest.  Required CRRT, now on IHD.  Nephrology consulted.,  Appreciate follow-up, tolerating IHD. -No apparent renal recovery, continue dialysis  Acute systolic CHF, severe mitral valve regurgitation -cardiology following, advanced heart failure following.  Due to frequent PVCs and SVT has been placed on amiodarone. -Now status post mitral transcatheter edge-to-edge repair (mTEER) on 07/16/2023 with placement of 3 clips and improvement in MR from severe to mild -Volume status as per dialysis/nephrology  Acute respiratory failure with hypoxia, pulmonary hypertension, acute PE -has improved significantly, currently tolerating IV heparin and has been weaned off to room air.  Will be transition to oral anticoagulants once tunneled HD cath is placed per nephrology  Hyponatremia-fluid management with HD  PAF-continue amiodarone, anticoagulation  PE-on heparin  Hyperthyroidism/possibly thyrotoxicosis - Continue methimazole. Need to check TSH in 4 weeks.  Last  TSH was checked on 3/14 showed some  improvement at 0.03   ID  -previously on Vanco meropenem and micafungin, now off antibiotics.  Cultures without growth.  Remains afebrile   Normocytic anemia -Hemoglobin stable   Acute small right CVA  - MRI of the head on 07/24/2022 shows marked fluid to the right cerebellar and cerebral hemisphere. Suspect embolic, continue IV heparin.  Nutrition/moderate protein caloric malnutrition -discontinue tube feeds and core track today since he has been more alert, gaining strength, eating without difficulties. -Still on thickened liquids, speech is to see him again today hopefully he will be advanced to thin liquids   Deconditioning -unclear his posthospitalization needs   Stage II sacral cubitus ulcer -noted  Scheduled Meds:  amiodarone  200 mg Oral Daily   arformoterol  15 mcg Nebulization BID   ascorbic acid  250 mg Oral BID   aspirin  81 mg Oral Daily   budesonide (PULMICORT) nebulizer solution  0.25 mg Nebulization BID   Chlorhexidine Gluconate Cloth  6 each Topical Q0600   darbepoetin (ARANESP) injection - DIALYSIS  200 mcg Subcutaneous Q Fri-1800   fiber supplement (BANATROL TF)  60 mL Oral QID   folic acid  1 mg Oral Daily   insulin aspart  0-20 Units Subcutaneous Q4H   methimazole  10 mg Oral Daily   mexiletine  250 mg Oral Q12H   multivitamin  1 tablet Oral QHS   mouth rinse  15 mL Mouth Rinse 4 times per day   revefenacin  175 mcg Nebulization Daily   sodium chloride flush  3 mL Intravenous Q12H   thiamine  100 mg Oral Daily   Or   thiamine  100 mg Intravenous Daily   zinc sulfate (50mg  elemental zinc)  220 mg Oral Daily   Continuous Infusions:  [START ON 08/04/2023]  ceFAZolin (ANCEF) IV     heparin 1,900 Units/hr (08/02/23 0800)   PRN Meds:.acetaminophen, chlorpheniramine-HYDROcodone, Gerhardt's butt cream, HYDROmorphone (DILAUDID) injection, labetalol, lactulose, [COMPLETED] loperamide HCl **FOLLOWED BY** loperamide HCl, ondansetron (ZOFRAN) IV, mouth rinse, oxyCODONE,  polyethylene glycol  Current Outpatient Medications  Medication Instructions   apixaban (ELIQUIS) 5 mg, Oral, 2 times daily   bismuth subsalicylate (PEPTO BISMOL) 524 mg, As needed   methimazole (TAPAZOLE) 10 mg, Oral, Every 8 hours   Metoprolol Tartrate 37.5 mg, Oral, 2 times daily   spironolactone (ALDACTONE) 25 mg, Oral, Daily    Diet Orders (From admission, onward)     Start     Ordered   07/29/23 1408  DIET DYS 3 Room service appropriate? Yes with Assist; Fluid consistency: Honey Thick  Diet effective now       Question Answer Comment  Room service appropriate? Yes with Assist   Fluid consistency: Honey Thick      07/29/23 1408            DVT prophylaxis: Heparin infusion   Lab Results  Component Value Date   PLT 398 08/02/2023      Code Status: Full Code  Family Communication: Family at bedside  Status is: Inpatient Remains inpatient appropriate because: Severity of illness  Level of care: Telemetry Cardiac  Consultants:  Cardiology Nephrology  Objective: Vitals:   08/02/23 0700 08/02/23 0800 08/02/23 0815 08/02/23 0830  BP: 128/82 125/71    Pulse: 90 79    Resp: (!) 24 (!) 22    Temp:   98.2 F (36.8 C)   TempSrc:   Oral   SpO2: 99% 97%  97%  Weight:      Height:        Intake/Output Summary (Last 24 hours) at 08/02/2023 0945 Last data filed at 08/02/2023 0800 Gross per 24 hour  Intake 1504.99 ml  Output 1500 ml  Net 4.99 ml   Wt Readings from Last 3 Encounters:  08/02/23 77.1 kg  03/27/23 95.3 kg  03/20/23 89.2 kg    Examination:  Constitutional: NAD Eyes: lids and conjunctivae normal, no scleral icterus ENMT: mmm Neck: normal, supple Respiratory: clear to auscultation bilaterally, no wheezing, no crackles.  Cardiovascular: Regular rate and rhythm, no murmurs / rubs / gallops. No LE edema. Abdomen: soft, no distention, no tenderness. Bowel sounds positive.    Data Reviewed: I have independently reviewed following labs and  imaging studies   CBC Recent Labs  Lab 07/28/23 0512 07/29/23 0427 07/30/23 1430 08/01/23 0418 08/02/23 0817  WBC 18.4* 17.6* 14.1* 9.9 8.5  HGB 7.6* 8.3* 8.6* 8.4* 8.8*  HCT 24.2* 26.2* 27.1* 26.2* 27.3*  PLT 307 357 433* 432* 398  MCV 94.9 94.2 94.8 94.6 94.1  MCH 29.8 29.9 30.1 30.3 30.3  MCHC 31.4 31.7 31.7 32.1 32.2  RDW 20.6* 19.9* 19.9* 19.0* 18.4*    Recent Labs  Lab 07/27/23 0500 07/27/23 1503 07/28/23 0512 07/29/23 0427 07/30/23 0500 07/31/23 0359 08/01/23 0418 08/02/23 0817  NA 137  133*   < > 133* 132* 131*  --  129* 130*  K 4.4  4.3   < > 4.6 4.6 4.6  --  5.3* 5.1  CL 102  102   < > 103 97* 95*  --  95* 94*  CO2 17*  20*   < > 18* 21* 19*  --  21* 23  GLUCOSE 132*  136*   < > 170* 151* 170*  --  156* 132*  BUN 75*  75*   < > 116* 90* 130*  --  117* 83*  CREATININE 5.69*  5.80*   < > 8.93* 7.20* 9.41*  --  9.47* 7.70*  CALCIUM 8.0*  7.8*   < > 7.9* 7.7* 7.9*  --  7.6* 8.0*  AST 46*  --  42* 396* 60* 46* 111*  --   ALT 27  --  26 147* 72* 49* 128*  --   ALKPHOS 118  --  115 500* 282* 243* 494*  --   BILITOT 1.0  --  1.1 0.9 0.9 0.7 0.8  --   ALBUMIN 2.2*  2.1*   < > 2.1*  2.1* 2.2* 2.1*  2.1* 2.2* 2.2*  --   MG 2.3  --  2.6* 2.3  --   --  2.3  --    < > = values in this interval not displayed.    ------------------------------------------------------------------------------------------------------------------ No results for input(s): "CHOL", "HDL", "LDLCALC", "TRIG", "CHOLHDL", "LDLDIRECT" in the last 72 hours.  Lab Results  Component Value Date   HGBA1C 4.8 07/10/2023   ------------------------------------------------------------------------------------------------------------------ No results for input(s): "TSH", "T4TOTAL", "T3FREE", "THYROIDAB" in the last 72 hours.  Invalid input(s): "FREET3"  Cardiac Enzymes No results for input(s): "CKMB", "TROPONINI", "MYOGLOBIN" in the last 168 hours.  Invalid input(s):  "CK" ------------------------------------------------------------------------------------------------------------------    Component Value Date/Time   BNP 980.5 (H) 07/10/2023 1224    CBG: Recent Labs  Lab 08/01/23 1538 08/01/23 1631 08/01/23 2015 08/01/23 2354 08/02/23 0406  GLUCAP 164* 153* 141* 111* 129*    Recent Results (from the past 240 hours)  Culture, Respiratory w Gram Stain  Status: None   Collection Time: 07/24/23  5:17 AM   Specimen: Tracheal Aspirate; Respiratory  Result Value Ref Range Status   Specimen Description TRACHEAL ASPIRATE  Final   Special Requests NONE  Final   Gram Stain NO WBC SEEN NO ORGANISMS SEEN   Final   Culture   Final    FEW Normal respiratory flora-no Staph aureus or Pseudomonas seen Performed at [facility], [address], [place], [place] [address]    Report Status 07/26/2023 FINAL  Final     Radiology Studies: ECHOCARDIOGRAM COMPLETE Result Date: 08/01/2023    ECHOCARDIOGRAM REPORT   Patient Name:   Aaron [name] Date of Exam: 08/01/2023 Medical Rec #:  [id number]    Height:       76.0 in Accession #:    [id number]   Weight:       173.1 lb Date of Birth:  02-03-1986    BSA:          2.083 m Patient Age:    37 years     BP:           150/92 mmHg Patient Gender: M            HR:           82 bpm. Exam Location:  Inpatient Procedure: 2D Echo (Both Spectral and Color Flow Doppler were utilized during            procedure). Indications:    mitral valve disorder  History:        Patient has prior history of Echocardiogram examinations, most                 recent 07/21/2023. Arrythmias:Cardiac Arrest; Risk                 Factors:Hypertension.                  Mitral Valve: Mitra-Clip valve is present in the mitral                 position. Procedure Date: 07/16/2023.  Sonographer:    Delcie Roch RDCS Referring Phys: [phone] ALMA L DIAZ IMPRESSIONS  1. Left ventricular ejection fraction, by estimation, is 40 to 45%. Left ventricular  ejection fraction by PLAX is 41 %. The left ventricle has mildly decreased function. The left ventricle demonstrates global hypokinesis. There is mild left ventricular hypertrophy. Left ventricular diastolic parameters are consistent with Grade I diastolic dysfunction (impaired relaxation).  2. Right ventricular systolic function is normal. The right ventricular size is normal. Tricuspid regurgitation signal is inadequate for assessing PA pressure.  3. Left atrial size was mildly dilated.  4. Right atrial size was mildly dilated.  5. 2 Mitra-clips noted in the A2-P2 position. The valve is hypermobile with the SAM of the anterior mitral leaflet, but no outflow obstruction - there is moderate stenosis with an expected double oreface valve. The mitral valve has been repaired/replaced. Mild mitral valve regurgitation. Moderate mitral stenosis. The mean mitral valve gradient is 7.0 mmHg with average heart rate of 79 bpm. There is a Mitra-Clip present in the mitral position. Procedure Date: 07/16/2023.  6. The aortic valve is tricuspid. Aortic valve regurgitation is trivial. Aortic valve sclerosis/calcification is present, without any evidence of aortic stenosis.  7. The inferior vena cava is normal in size with greater than 50% respiratory variability, suggesting right atrial pressure of 3 mmHg.  8. Evidence of atrial level shunting detected by color  flow Doppler. There is a small secundum atrial septal defect with predominantly left to right shunting across the atrial septum. Comparison(s): Changes from prior study are noted. 07/21/2023: LVEF 35-40%. FINDINGS  Left Ventricle: Left ventricular ejection fraction, by estimation, is 40 to 45%. Left ventricular ejection fraction by PLAX is 41 %. The left ventricle has mildly decreased function. The left ventricle demonstrates global hypokinesis. The left ventricular internal cavity size was normal in size. There is mild left ventricular hypertrophy. Left ventricular diastolic  parameters are consistent with Grade I diastolic dysfunction (impaired relaxation). Indeterminate filling pressures. Right Ventricle: The right ventricular size is normal. No increase in right ventricular wall thickness. Right ventricular systolic function is normal. Tricuspid regurgitation signal is inadequate for assessing PA pressure. Left Atrium: Left atrial size was mildly dilated. Right Atrium: Right atrial size was mildly dilated. Pericardium: There is no evidence of pericardial effusion. Mitral Valve: 2 Mitra-clips noted in the A2-P2 position. The valve is hypermobile with the SAM of the anterior mitral leaflet, but no outflow obstruction - there is moderate stenosis with an expected double oreface valve. The mitral valve has been repaired/replaced. Mild mitral valve regurgitation. There is a Mitra-Clip present in the mitral position. Procedure Date: 07/16/2023. Moderate mitral valve stenosis. MV peak gradient, 15.1 mmHg. The mean mitral valve gradient is 7.0 mmHg with average heart  rate of 79 bpm. Tricuspid Valve: The tricuspid valve is grossly normal. Tricuspid valve regurgitation is trivial. Aortic Valve: The aortic valve is tricuspid. Aortic valve regurgitation is trivial. Aortic valve sclerosis/calcification is present, without any evidence of aortic stenosis. Pulmonic Valve: The pulmonic valve was normal in structure. Pulmonic valve regurgitation is not visualized. Aorta: The aortic root and ascending aorta are structurally normal, with no evidence of dilitation. Venous: The inferior vena cava is normal in size with greater than 50% respiratory variability, suggesting right atrial pressure of 3 mmHg. IAS/Shunts: Evidence of atrial level shunting detected by color flow Doppler. There is a small secundum atrial septal defect with predominantly left to right shunting across the atrial septum.  LEFT VENTRICLE PLAX 2D LV EF:         Left            Diastology                ventricular     LV e' medial:     5.98 cm/s                ejection        LV E/e' medial:  23.9                fraction by     LV e' lateral:   7.62 cm/s                PLAX is 41      LV E/e' lateral: 18.8                %. LVIDd:         5.50 cm LVIDs:         4.40 cm LV PW:         1.00 cm LV IVS:        1.10 cm LVOT diam:     2.60 cm LV SV:         104 LV SV Index:   50 LVOT Area:     5.31 cm  RIGHT VENTRICLE  IVC RV Basal diam:  3.10 cm     IVC diam: 1.80 cm RV S prime:     16.30 cm/s TAPSE (M-mode): 2.5 cm LEFT ATRIUM             Index        RIGHT ATRIUM           Index LA diam:        4.00 cm 1.92 cm/m   RA Area:     19.10 cm LA Vol (A2C):   84.7 ml 40.66 ml/m  RA Volume:   48.40 ml  23.23 ml/m LA Vol (A4C):   72.4 ml 34.76 ml/m LA Biplane Vol: 82.7 ml 39.70 ml/m  AORTIC VALVE LVOT Vmax:   130.00 cm/s LVOT Vmean:  85.000 cm/s LVOT VTI:    0.196 m  AORTA Ao Root diam: 3.20 cm Ao Asc diam:  3.50 cm MITRAL VALVE MV Area VTI:  1.94 cm      SHUNTS MV Peak grad: 15.1 mmHg     Systemic VTI:  0.20 m MV Mean grad: 7.0 mmHg      Systemic Diam: 2.60 cm MV Vmax:      1.94 m/s MV Vmean:     131.0 cm/s MV E velocity: 143.00 cm/s MV A velocity: 191.00 cm/s MV E/A ratio:  0.75 Zoila Shutter MD Electronically signed by Zoila Shutter MD Signature Date/Time: 08/01/2023/5:47:48 PM    Final      Pamella Pert, MD, PhD [facility]  Between 7 am - 7 pm I am available, please contact me via Amion (for emergencies) or Securechat (non urgent messages)  Between 7 pm - 7 am I am not available, please contact night coverage MD/APP via Amion

## 2023-08-02 NOTE — Progress Notes (Signed)
 [facility] NEPHROLOGY PROGRESS NOTE  Assessment/ Plan: Pt is a 38 y.o. yo male  likely ATN after cardiac arrest, cardiogenic shock on ECMO ( decan 3/8) and Impella ( removed 3/12), AHRF/VDRF   # Dialysis dependent anuric AKI 2/2 ATN from cardiogenic shock on CRRT since 07/11/23, Left subclavian temp HD cath placed by CCM;  Tolerating CRRT well however getting interruptions because of procedures, imaging studies as well as clotting.  The potassium level was running high --  CRRT prescription was changed, then K in the 3's-  change again to 4 K pre and post and cont 2 K dialysate-  UF around 50 / hour-  volume does not seem terrible.   Clotting was an issue.  CRRT was stopped on 3/15.  No UOP and numbers rising.  Tolerated IHD 3/17; 3/19 (1.7L)  Lt SCV temp.  Bladder scan only on 3/17 -> UOP (may be picking up).  Maintain on MWF regimen  + daily evaluation for any signs of renal recovery.   Requested conversion to TC as it appears he is in for a longer course of renal failure; appreciate VIR seeing him and TC will be placed Mon. Will work dialysis around [employer].  # Cardiogenic Shock,  Impella removed; status post transcutaneous mitral valve repair; previously on ECMO. per cardiology.  # VT + PEA SCA, as per cardiology team. # PE on heparin per CCM # Anemia, Hb  dropping--  added ESA, supportive care # Hyperkalemia, less present .  Off  CRRT 3/15 now on iHD. # Severe MR s/p TEER 3/5   Subjective:  Extubated, awake and appropriate; fianc bedside today.  UOP 538ml/24hr.  CRRT stopped 3/15. Tolerated iHD on Wed and Fri. Asking for thinner drinks, tolerating solids  Objective Vital signs in last 24 hours: Vitals:   08/02/23 0700 08/02/23 0800 08/02/23 0815 08/02/23 0830  BP: 128/82 125/71    Pulse: 90 79    Resp: (!) 24 (!) 22    Temp:   98.2 F (36.8 C)   TempSrc:   Oral   SpO2: 99% 97%  97%  Weight:      Height:       Weight change: 1.2 kg  Intake/Output  Summary (Last 24 hours) at 08/02/2023 1047 Last data filed at 08/02/2023 0800 Gross per 24 hour  Intake 1501.99 ml  Output 1500 ml  Net 1.99 ml       Labs: RENAL PANEL Recent Labs  Lab 07/27/23 0500 07/27/23 1503 07/27/23 1608 07/28/23 0512 07/29/23 0427 07/30/23 0500 07/31/23 0359 08/01/23 0418 08/02/23 0817  NA 137  133* 134*   < > 133* 132* 131*  --  129* 130*  K 4.4  4.3 4.4   < > 4.6 4.6 4.6  --  5.3* 5.1  CL 102  102 102  --  103 97* 95*  --  95* 94*  CO2 17*  20* 16*  --  18* 21* 19*  --  21* 23  GLUCOSE 132*  136* 148*  --  170* 151* 170*  --  156* 132*  BUN 75*  75* 90*  --  116* 90* 130*  --  117* 83*  CREATININE 5.69*  5.80* 7.40*  --  8.93* 7.20* 9.41*  --  9.47* 7.70*  CALCIUM 8.0*  7.8* 7.9*  --  7.9* 7.7* 7.9*  --  7.6* 8.0*  MG 2.3  --   --  2.6* 2.3  --   --  2.3  --  PHOS 4.5  4.5 4.1  --  4.3 5.0* 5.3*  --  5.8*  --   ALBUMIN 2.2*  2.1* 2.1*  --  2.1*  2.1* 2.2* 2.1*  2.1* 2.2* 2.2*  --    < > = values in this interval not displayed.    Liver Function Tests: Recent Labs  Lab 07/30/23 0500 07/31/23 0359 08/01/23 0418  AST 60* 46* 111*  ALT 72* 49* 128*  ALKPHOS 282* 243* 494*  BILITOT 0.9 0.7 0.8  PROT 6.1* 6.4* 6.0*  ALBUMIN 2.1*  2.1* 2.2* 2.2*   No results for input(s): "LIPASE", "AMYLASE" in the last 168 hours. No results for input(s): "AMMONIA" in the last 168 hours.  CBC: Recent Labs    07/28/23 0512 07/29/23 0427 07/30/23 1430 08/01/23 0418 08/02/23 0817  HGB 7.6* 8.3* 8.6* 8.4* 8.8*  MCV 94.9 94.2 94.8 94.6 94.1    Cardiac Enzymes: No results for input(s): "CKTOTAL", "CKMB", "CKMBINDEX", "TROPONINI" in the last 168 hours. CBG: Recent Labs  Lab 08/01/23 1538 08/01/23 1631 08/01/23 2015 08/01/23 2354 08/02/23 0406  GLUCAP 164* 153* 141* 111* 129*    Iron Studies: No results for input(s): "IRON", "TIBC", "TRANSFERRIN", "FERRITIN" in the last 72 hours. Studies/Results: ECHOCARDIOGRAM COMPLETE Result  Date: 08/01/2023    ECHOCARDIOGRAM REPORT   Patient Name:   OLUWATOSIN HIGGINSON Date of Exam: 08/01/2023 Medical Rec #:  [id number]    Height:       76.0 in Accession #:    [id number]   Weight:       173.1 lb Date of Birth:  1986/02/20    BSA:          2.083 m Patient Age:    37 years     BP:           150/92 mmHg Patient Gender: M            HR:           82 bpm. Exam Location:  Inpatient Procedure: 2D Echo (Both Spectral and Color Flow Doppler were utilized during            procedure). Indications:    mitral valve disorder  History:        Patient has prior history of Echocardiogram examinations, most                 recent 07/21/2023. Arrythmias:Cardiac Arrest; Risk                 Factors:Hypertension.                  Mitral Valve: Mitra-Clip valve is present in the mitral                 position. Procedure Date: 07/16/2023.  Sonographer:    Delcie Roch RDCS Referring Phys: [phone] ALMA L DIAZ IMPRESSIONS  1. Left ventricular ejection fraction, by estimation, is 40 to 45%. Left ventricular ejection fraction by PLAX is 41 %. The left ventricle has mildly decreased function. The left ventricle demonstrates global hypokinesis. There is mild left ventricular hypertrophy. Left ventricular diastolic parameters are consistent with Grade I diastolic dysfunction (impaired relaxation).  2. Right ventricular systolic function is normal. The right ventricular size is normal. Tricuspid regurgitation signal is inadequate for assessing PA pressure.  3. Left atrial size was mildly dilated.  4. Right atrial size was mildly dilated.  5. 2 Mitra-clips noted in the A2-P2 position. The valve is hypermobile with the SAM of  the anterior mitral leaflet, but no outflow obstruction - there is moderate stenosis with an expected double oreface valve. The mitral valve has been repaired/replaced. Mild mitral valve regurgitation. Moderate mitral stenosis. The mean mitral valve gradient is 7.0 mmHg with average heart rate of 79 bpm. There is a  Mitra-Clip present in the mitral position. Procedure Date: 07/16/2023.  6. The aortic valve is tricuspid. Aortic valve regurgitation is trivial. Aortic valve sclerosis/calcification is present, without any evidence of aortic stenosis.  7. The inferior vena cava is normal in size with greater than 50% respiratory variability, suggesting right atrial pressure of 3 mmHg.  8. Evidence of atrial level shunting detected by color flow Doppler. There is a small secundum atrial septal defect with predominantly left to right shunting across the atrial septum. Comparison(s): Changes from prior study are noted. 07/21/2023: LVEF 35-40%. FINDINGS  Left Ventricle: Left ventricular ejection fraction, by estimation, is 40 to 45%. Left ventricular ejection fraction by PLAX is 41 %. The left ventricle has mildly decreased function. The left ventricle demonstrates global hypokinesis. The left ventricular internal cavity size was normal in size. There is mild left ventricular hypertrophy. Left ventricular diastolic parameters are consistent with Grade I diastolic dysfunction (impaired relaxation). Indeterminate filling pressures. Right Ventricle: The right ventricular size is normal. No increase in right ventricular wall thickness. Right ventricular systolic function is normal. Tricuspid regurgitation signal is inadequate for assessing PA pressure. Left Atrium: Left atrial size was mildly dilated. Right Atrium: Right atrial size was mildly dilated. Pericardium: There is no evidence of pericardial effusion. Mitral Valve: 2 Mitra-clips noted in the A2-P2 position. The valve is hypermobile with the SAM of the anterior mitral leaflet, but no outflow obstruction - there is moderate stenosis with an expected double oreface valve. The mitral valve has been repaired/replaced. Mild mitral valve regurgitation. There is a Mitra-Clip present in the mitral position. Procedure Date: 07/16/2023. Moderate mitral valve stenosis. MV peak gradient, 15.1 mmHg.  The mean mitral valve gradient is 7.0 mmHg with average heart  rate of 79 bpm. Tricuspid Valve: The tricuspid valve is grossly normal. Tricuspid valve regurgitation is trivial. Aortic Valve: The aortic valve is tricuspid. Aortic valve regurgitation is trivial. Aortic valve sclerosis/calcification is present, without any evidence of aortic stenosis. Pulmonic Valve: The pulmonic valve was normal in structure. Pulmonic valve regurgitation is not visualized. Aorta: The aortic root and ascending aorta are structurally normal, with no evidence of dilitation. Venous: The inferior vena cava is normal in size with greater than 50% respiratory variability, suggesting right atrial pressure of 3 mmHg. IAS/Shunts: Evidence of atrial level shunting detected by color flow Doppler. There is a small secundum atrial septal defect with predominantly left to right shunting across the atrial septum.  LEFT VENTRICLE PLAX 2D LV EF:         Left            Diastology                ventricular     LV e' medial:    5.98 cm/s                ejection        LV E/e' medial:  23.9                fraction by     LV e' lateral:   7.62 cm/s                PLAX is 41  LV E/e' lateral: 18.8                %. LVIDd:         5.50 cm LVIDs:         4.40 cm LV PW:         1.00 cm LV IVS:        1.10 cm LVOT diam:     2.60 cm LV SV:         104 LV SV Index:   50 LVOT Area:     5.31 cm  RIGHT VENTRICLE             IVC RV Basal diam:  3.10 cm     IVC diam: 1.80 cm RV S prime:     16.30 cm/s TAPSE (M-mode): 2.5 cm LEFT ATRIUM             Index        RIGHT ATRIUM           Index LA diam:        4.00 cm 1.92 cm/m   RA Area:     19.10 cm LA Vol (A2C):   84.7 ml 40.66 ml/m  RA Volume:   48.40 ml  23.23 ml/m LA Vol (A4C):   72.4 ml 34.76 ml/m LA Biplane Vol: 82.7 ml 39.70 ml/m  AORTIC VALVE LVOT Vmax:   130.00 cm/s LVOT Vmean:  85.000 cm/s LVOT VTI:    0.196 m  AORTA Ao Root diam: 3.20 cm Ao Asc diam:  3.50 cm MITRAL VALVE MV Area VTI:  1.94 cm       SHUNTS MV Peak grad: 15.1 mmHg     Systemic VTI:  0.20 m MV Mean grad: 7.0 mmHg      Systemic Diam: 2.60 cm MV Vmax:      1.94 m/s MV Vmean:     131.0 cm/s MV E velocity: 143.00 cm/s MV A velocity: 191.00 cm/s MV E/A ratio:  0.75 Zoila Shutter MD Electronically signed by Zoila Shutter MD Signature Date/Time: 08/01/2023/5:47:48 PM    Final      Medications: Infusions:  [START ON 08/04/2023]  ceFAZolin (ANCEF) IV     heparin 1,900 Units/hr (08/02/23 0800)    Scheduled Medications:  amiodarone  200 mg Oral Daily   arformoterol  15 mcg Nebulization BID   ascorbic acid  250 mg Oral BID   aspirin  81 mg Oral Daily   budesonide (PULMICORT) nebulizer solution  0.25 mg Nebulization BID   Chlorhexidine Gluconate Cloth  6 each Topical Q0600   darbepoetin (ARANESP) injection - DIALYSIS  200 mcg Subcutaneous Q Fri-1800   fiber supplement (BANATROL TF)  60 mL Oral QID   folic acid  1 mg Oral Daily   insulin aspart  0-20 Units Subcutaneous Q4H   methimazole  10 mg Oral Daily   mexiletine  250 mg Oral Q12H   multivitamin  1 tablet Oral QHS   mouth rinse  15 mL Mouth Rinse 4 times per day   revefenacin  175 mcg Nebulization Daily   sodium chloride flush  3 mL Intravenous Q12H   thiamine  100 mg Oral Daily   Or   thiamine  100 mg Intravenous Daily   zinc sulfate (50mg  elemental zinc)  220 mg Oral Daily    have reviewed scheduled and prn medications.  Physical Exam: General: Critically ill looking male, alert  Heart:RRR, s1s2 nl Lungs: rhonchi. Abdomen:soft, non-distended Extremities:No edema Dialysis Access:  Left subclavian temporary HD line.-  newest line placed 3/9  Akilah Cureton W 08/02/2023,10:47 AM  LOS: 24 days

## 2023-08-02 NOTE — Progress Notes (Signed)
 Patient ID: Aaron [name], male   DOB: [birth year], 38 y.o.   MRN: [id number]     Advanced Heart Failure Rounding Note  Cardiologist: Christell Constant, MD  Chief Complaint: V-A ECMO  Subjective:   - Admitted 2/26 with worsening SOB, CP and a fib with RVR. - Decompensated 2/27 with IVF, nodal blockade with subsequent respiratory arrest, ROSC achieved after ~16 minute down time - Femoral VA ecmo cannulation 2/27 with Impella CP vent - ECMO decannulation 3/8 - Impella removed and extubated 3/12.  Had to be reintubated with mucus plugging and left lung opacification.  - MRI head 3/13 with small infarcts right cerebral and cerebellar hemispheres - Re-extubated 3/16  Sitting up in bed playing X box.   Tolerating iHD well. Starting to make urine 500cc/24 hours   Echo 08/01/23 EF 40-45% mild residual MR. RV ok Personally reviewed  Denies CP or SOB.    Objective:    Weight Range: 77.1 kg Body mass index is 20.69 kg/m.   Vital Signs:   Temp:  [97.6 F (36.4 C)-99.5 F (37.5 C)] 98.2 F (36.8 C) (03/22 0815) Pulse Rate:  [75-96] 79 (03/22 0800) Resp:  [16-30] 22 (03/22 0800) BP: (109-153)/(66-115) 125/71 (03/22 0800) SpO2:  [94 %-100 %] 97 % (03/22 0830) Weight:  [77.1 kg-77.5 kg] 77.1 kg (03/22 0432) Last BM Date : 08/01/23  Weight change: Filed Weights   08/01/23 1023 08/01/23 1429 08/02/23 0432  Weight: 78.5 kg 77.5 kg 77.1 kg    Intake/Output:   Intake/Output Summary (Last 24 hours) at 08/02/2023 1116 Last data filed at 08/02/2023 1000 Gross per 24 hour  Intake 1659.77 ml  Output 1500 ml  Net 159.77 ml    Physical Exam   General:  Sitting up in bed No resp difficulty HEENT: normal Neck: supple. + L SCV HD cath Carotids 2+ bilat; no bruits. No lymphadenopathy or thryomegaly appreciated. Cor: PMI nondisplaced. Regular rate & rhythm. No rubs, gallops or murmurs. Lungs: clear Abdomen: soft, nontender, nondistended. No hepatosplenomegaly. No bruits or masses. Good  bowel sounds. Extremities: no cyanosis, clubbing, rash, edema Neuro: alert & orientedx3, cranial nerves grossly intact. moves all 4 extremities w/o difficulty. Affect pleasant   Telemetry   Sinus 70-80s Personally reviewed  Labs    CBC Recent Labs    08/01/23 0418 08/02/23 0817  WBC 9.9 8.5  HGB 8.4* 8.8*  HCT 26.2* 27.3*  MCV 94.6 94.1  PLT 432* 398   Basic Metabolic Panel Recent Labs    86/57/84 0418 08/02/23 0817  NA 129* 130*  K 5.3* 5.1  CL 95* 94*  CO2 21* 23  GLUCOSE 156* 132*  BUN 117* 83*  CREATININE 9.47* 7.70*  CALCIUM 7.6* 8.0*  MG 2.3  --   PHOS 5.8*  --    Liver Function Tests Recent Labs    07/31/23 0359 08/01/23 0418  AST 46* 111*  ALT 49* 128*  ALKPHOS 243* 494*  BILITOT 0.7 0.8  PROT 6.4* 6.0*  ALBUMIN 2.2* 2.2*   BNP: BNP (last 3 results) Recent Labs    07/09/23 1934 07/10/23 1224  BNP 703.7* 980.5*   Thyroid Function Tests No results for input(s): "TSH", "T4TOTAL", "T3FREE", "THYROIDAB" in the last 72 hours.  Invalid input(s): "FREET3"  Medications:   Scheduled Medications:  amiodarone  200 mg Oral Daily   arformoterol  15 mcg Nebulization BID   ascorbic acid  250 mg Oral BID   aspirin  81 mg Oral Daily   budesonide (PULMICORT)  nebulizer solution  0.25 mg Nebulization BID   Chlorhexidine Gluconate Cloth  6 each Topical Q0600   darbepoetin (ARANESP) injection - DIALYSIS  200 mcg Subcutaneous Q Fri-1800   fiber supplement (BANATROL TF)  60 mL Oral QID   folic acid  1 mg Oral Daily   insulin aspart  0-20 Units Subcutaneous Q4H   methimazole  10 mg Oral Daily   mexiletine  250 mg Oral Q12H   multivitamin  1 tablet Oral QHS   mouth rinse  15 mL Mouth Rinse 4 times per day   revefenacin  175 mcg Nebulization Daily   sodium chloride flush  3 mL Intravenous Q12H   thiamine  100 mg Oral Daily   Or   thiamine  100 mg Intravenous Daily   zinc sulfate (50mg  elemental zinc)  220 mg Oral Daily    Infusions:  [START ON  08/04/2023]  ceFAZolin (ANCEF) IV     heparin 1,900 Units/hr (08/02/23 1000)    PRN Medications: acetaminophen, chlorpheniramine-HYDROcodone, Gerhardt's butt cream, HYDROmorphone (DILAUDID) injection, labetalol, lactulose, [COMPLETED] loperamide HCl **FOLLOWED BY** loperamide HCl, ondansetron (ZOFRAN) IV, mouth rinse, oxyCODONE, polyethylene glycol Patient Profile   Patient with a history of Grave's disease, severe primary mitral regurgitation who presented with chest pain and segmental PE. Subsequent progressed to SCAI Stage E shock requiring VA ECMO cannulation on 2/27.   Assessment/Plan  SCAI Stage E Cardiogenic shock - Suspect hypoxic respiratory failure driven with segmental PE on top of severe MR, nodal blockage, and thyrotoxicosis - Down time ~ 20 minutes, good mental status confirmed post code - Underwent successful mTEER on 07/16/23 with placement of x3 clips and improvement in MR from severe to mild. Mean mitral gradient of 3-12mmHg.  - Decannulated from V-A ECMO by Dr. Karin Lieu on 07/19/23 with vascular cut down and primary repair of left femoral artery.  - Echo 3/10: EF 35-40%, anterior and septal HK, normal RV, s/p Mitraclip with mild MR and mean gradient 7 mmHg.  - Impella removed 3/12.  - Echo 08/01/23 EF 40-45% mild residual MR. RV ok Personally reviewed - Resolved. Doing well today. GDMT limited by AKI/CKD   Severe mitral valve regurgitation - Noted on previous echocardiogram - Now s/p mTEER on 07/16/23; improvement in MR from severe to mild.  - Echo with mild residual MR  Pulmonary hypertension - Due to severe MR - Now much improved.   VT/NSVT - Initially with frequent runs of NSVT likely secondary to impella 5.5 position. Impella now out.  - no further VT - stop mexilitene - continue amiodarone 200 mg daily for now. Stop prior to d/c   Acute renal failure/hyperkalemia - Nephrology following. Had iHD 3/20 - Starting to make urine - Possible [facility] Monday unless has  significant renal recover - Plan to transition to Eliquis when long term HD access in place.   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   Atrial fibrillation - Present on arrival, in the setting of PE and thyroid disease - maintaining NSR  - Continue po amio for now  - heparin gtt. Eliquis after TDC placed  Pulmonary embolism:  - Segmental without right heart strain - Heparin gtt.  Eliquis after TDC placed  CAD - Prior embolic infarct in the LAD territory with corresponding LGE on CMR - Lifelong anticoagulation - On heparin gtt   CVA - MRI head 3/13  with small infarcts right cerebral and cerebellar hemispheres. This should not significantly affect consciousness.  - Suspect embolic, on heparin gtt.   Continues to improved. For potential [facility] Monday followed by d/c to CIR. Continue PT/OT. Changes as above.    Length of Stay: 24  Arvilla Meres, MD  08/02/2023, 11:16 AM  Advanced Heart Failure Team Pager [phone] (M-F; 7a - 5p)  Please contact [facility] for night-coverage after hours (5p -7a ) and weekends on [email or web address]

## 2023-08-02 NOTE — Progress Notes (Signed)
 Inpatient Rehab Admissions Coordinator:  Received insurance authorization. Informed pt, pt's girlfriend [name] and pt's sister Shanda Bumps. Await bed availability. Will continue to follow.   Wolfgang Phoenix, MS, CCC-SLP Admissions Coordinator [phone]

## 2023-08-02 NOTE — Progress Notes (Signed)
 PHARMACY - ANTICOAGULATION CONSULT NOTE  Pharmacy Consult for Heparin  Indication: pulmonary embolus, s/p ECMO decannulation 3/7, Impella removal 3/12  No Known Allergies  Patient Measurements: Height: 6\' 4"  (193 cm) Weight: 77.1 kg (169 lb 15.6 oz) IBW/kg (Calculated) : 86.8 Heparin Dosing Weight: n/a  Vital Signs: Temp: 99.2 F (37.3 C) (03/22 0400) Temp Source: Oral (03/22 0400) BP: 128/82 (03/22 0700) Pulse Rate: 90 (03/22 0700)  Labs: Recent Labs    07/30/23 1430 07/31/23 0359 08/01/23 0418 08/02/23 0422  HGB 8.6*  --  8.4*  --   HCT 27.1*  --  26.2*  --   PLT 433*  --  432*  --   HEPARINUNFRC  --  0.39 0.47 0.34  CREATININE  --   --  9.47*  --     Estimated Creatinine Clearance: 11.5 mL/min (A) (by C-G formula based on SCr of 9.47 mg/dL (H)).   Medical History: Past Medical History:  Diagnosis Date   Atrial fibrillation [facility])    Hyperthyroidism    Mitral regurgitation    NSTEMI (non-ST elevated myocardial infarction) [facility])     Assessment: 38 yo M with bilateral PE s/p TNK (2/27 @1156 ) now on [place] ECMO + Impella. Pharmacy consulted for bivalirudin for anticoagulation.   S/p impella CP >5.5 3/3 - bivalirudin was previously held with oozing at insertion site but restart 3/4. Patient underwent mitraclip 3/5.  Bival transitioned to heparin 3/7 after decannulation.  Impella 5.5 removed 3/13. Heparin restarted 3/13. Hep in CRRT stopped 3/15 with CRRT stopping.   Heparin level is therapeutic at 0.34, on 1900 units/hr. CBC pending for today.  Hgb has been low/stable past several days, planning to begin apixaban once permanent HD line placed.  Goal of Therapy:  Heparin level 0.3-0.7 units/mL Monitor platelets by anticoagulation protocol: Yes   Plan:  Continue heparin infusion at 1900 units/hr Heparin level and CBC daily  Monitor s/sx of bleeding   Reece Leader, Loura Back, BCPS, [facility] Clinical Pharmacist  08/02/2023 8:07 AM   [facility] pharmacy phone numbers are  listed on [email or web address]

## 2023-08-03 DIAGNOSIS — E875 Hyperkalemia: Secondary | ICD-10-CM | POA: Diagnosis not present

## 2023-08-03 DIAGNOSIS — N170 Acute kidney failure with tubular necrosis: Secondary | ICD-10-CM | POA: Diagnosis not present

## 2023-08-03 DIAGNOSIS — I2699 Other pulmonary embolism without acute cor pulmonale: Secondary | ICD-10-CM | POA: Diagnosis not present

## 2023-08-03 DIAGNOSIS — R0602 Shortness of breath: Secondary | ICD-10-CM | POA: Diagnosis not present

## 2023-08-03 DIAGNOSIS — I469 Cardiac arrest, cause unspecified: Secondary | ICD-10-CM | POA: Diagnosis not present

## 2023-08-03 DIAGNOSIS — R079 Chest pain, unspecified: Secondary | ICD-10-CM | POA: Diagnosis not present

## 2023-08-03 DIAGNOSIS — E872 Acidosis, unspecified: Secondary | ICD-10-CM | POA: Diagnosis not present

## 2023-08-03 DIAGNOSIS — J9601 Acute respiratory failure with hypoxia: Secondary | ICD-10-CM | POA: Diagnosis not present

## 2023-08-03 DIAGNOSIS — R570 Cardiogenic shock: Secondary | ICD-10-CM | POA: Diagnosis not present

## 2023-08-03 LAB — GLUCOSE, CAPILLARY
Glucose-Capillary: 108 mg/dL — ABNORMAL HIGH (ref 70–99)
Glucose-Capillary: 105 mg/dL — ABNORMAL HIGH (ref 70–99)
Glucose-Capillary: 89 mg/dL (ref 70–99)
Glucose-Capillary: 109 mg/dL — ABNORMAL HIGH (ref 70–99)

## 2023-08-03 LAB — CBC
HCT: 28.7 % — ABNORMAL LOW (ref 39.0–52.0)
Hemoglobin: 9.4 g/dL — ABNORMAL LOW (ref 13.0–17.0)
MCH: 30.4 pg (ref 26.0–34.0)
MCHC: 32.8 g/dL (ref 30.0–36.0)
MCV: 92.9 fL (ref 80.0–100.0)
Platelets: 421 10*3/uL — ABNORMAL HIGH (ref 150–400)
RBC: 3.09 MIL/uL — ABNORMAL LOW (ref 4.22–5.81)
RDW: 17.4 % — ABNORMAL HIGH (ref 11.5–15.5)
WBC: 8.5 10*3/uL (ref 4.0–10.5)
nRBC: 0.2 % (ref 0.0–0.2)

## 2023-08-03 LAB — COMPREHENSIVE METABOLIC PANEL
ALT: 65 U/L — ABNORMAL HIGH (ref 0–44)
AST: 39 U/L (ref 15–41)
Albumin: 2.5 g/dL — ABNORMAL LOW (ref 3.5–5.0)
Alkaline Phosphatase: 296 U/L — ABNORMAL HIGH (ref 38–126)
Anion gap: 15 (ref 5–15)
BUN: 94 mg/dL — ABNORMAL HIGH (ref 6–20)
CO2: 21 mmol/L — ABNORMAL LOW (ref 22–32)
Calcium: 8.2 mg/dL — ABNORMAL LOW (ref 8.9–10.3)
Chloride: 94 mmol/L — ABNORMAL LOW (ref 98–111)
Creatinine, Ser: 9.15 mg/dL — ABNORMAL HIGH (ref 0.61–1.24)
GFR, Estimated: 7 mL/min — ABNORMAL LOW (ref >60)
Glucose, Bld: 91 mg/dL (ref 70–99)
Potassium: 5.5 mmol/L — ABNORMAL HIGH (ref 3.5–5.1)
Sodium: 130 mmol/L — ABNORMAL LOW (ref 135–145)
Total Bilirubin: 0.9 mg/dL (ref 0.0–1.2)
Total Protein: 6.6 g/dL (ref 6.5–8.1)

## 2023-08-03 LAB — MAGNESIUM: Magnesium: 2.2 mg/dL (ref 1.7–2.4)

## 2023-08-03 LAB — HEPARIN LEVEL (UNFRACTIONATED): Heparin Unfractionated: 0.45 [IU]/mL (ref 0.30–0.70)

## 2023-08-03 MED ORDER — SODIUM ZIRCONIUM CYCLOSILICATE 10 G PO PACK
10.0000 g | PACK | Freq: Two times a day (BID) | ORAL | Status: AC
Start: 2023-08-03 — End: 2023-08-03
  Administered 2023-08-03 (×2): 10 g via ORAL
  Filled 2023-08-03 (×2): qty 1

## 2023-08-03 NOTE — Progress Notes (Signed)
 [facility] NEPHROLOGY PROGRESS NOTE  Assessment/ Plan: Pt is a 38 y.o. yo male  likely ATN after cardiac arrest, cardiogenic shock on ECMO ( decan 3/8) and Impella ( removed 3/12), AHRF/VDRF   # Dialysis dependent anuric AKI 2/2 ATN from cardiogenic shock on CRRT since 07/11/23, Left subclavian temp HD cath placed by CCM;  Tolerating CRRT well however getting interruptions because of procedures, imaging studies as well as clotting.  The potassium level was running high --  CRRT prescription was changed, then K in the 3's-  change again to 4 K pre and post and cont 2 K dialysate-  UF around 50 / hour-  volume does not seem terrible.   Clotting was an issue.  CRRT was stopped on 3/15.  No UOP and numbers rising.  Tolerated IHD 3/17; 3/19 (1.7L)  Lt SCV temp.   Bladder scan only on 3/17 -> UOP seems to be rising 1061ml/24hr.   Had requested conversion to TC but urine output increasing.  Clearance is not quite there as he had treatment on Friday and creatinine already up to 9.  He still may be in for a longer course of renal failure but recent incr in UOP is promising.   Plan on 1st shift HD tomorrow with check w/ Dr. Malen Gauze and will give [facility] today.  # Cardiogenic Shock,  Impella removed; status post transcutaneous mitral valve repair; previously on ECMO. per cardiology.  # VT + PEA SCA, as per cardiology team. # PE on heparin per CCM # Anemia, Hb  dropping--  added ESA, supportive care # Hyperkalemia, less present .  Off  CRRT 3/15 now on iHD. # Severe MR s/p TEER 3/5   Subjective:  fianc bedside today.  CRRT stopped 3/15. Tolerated iHD on Wed and Fri. Asking for thinner drinks, tolerating solids. UOP picking up /24hrs with 1L  Objective Vital signs in last 24 hours: Vitals:   08/02/23 2317 08/03/23 0235 08/03/23 0754 08/03/23 0825  BP: 135/79 138/83  (!) 142/94  Pulse: 74 69    Resp: 20 18  20   Temp: 98.7 F (37.1 C) 97.7 F (36.5 C)  98.1 F (36.7 C)   TempSrc: Oral Oral  Oral  SpO2: 99% 99% 100%   Weight:      Height:       Weight change:   Intake/Output Summary (Last 24 hours) at 08/03/2023 0951 Last data filed at 08/03/2023 0827 Gross per 24 hour  Intake 822.35 ml  Output 1250 ml  Net -427.65 ml       Labs: RENAL PANEL Recent Labs  Lab 07/27/23 1503 07/27/23 1608 07/28/23 0512 07/29/23 0427 07/30/23 0500 07/31/23 0359 08/01/23 0418 08/02/23 0817 08/03/23 0401  NA 134*   < > 133* 132* 131*  --  129* 130* 130*  K 4.4   < > 4.6 4.6 4.6  --  5.3* 5.1 5.5*  CL 102  --  103 97* 95*  --  95* 94* 94*  CO2 16*  --  18* 21* 19*  --  21* 23 21*  GLUCOSE 148*  --  170* 151* 170*  --  156* 132* 91  BUN 90*  --  116* 90* 130*  --  117* 83* 94*  CREATININE 7.40*  --  8.93* 7.20* 9.41*  --  9.47* 7.70* 9.15*  CALCIUM 7.9*  --  7.9* 7.7* 7.9*  --  7.6* 8.0* 8.2*  MG  --   --  2.6* 2.3  --   --  2.3  --  2.2  PHOS 4.1  --  4.3 5.0* 5.3*  --  5.8*  --   --   ALBUMIN 2.1*  --  2.1*  2.1* 2.2* 2.1*  2.1* 2.2* 2.2*  --  2.5*   < > = values in this interval not displayed.    Liver Function Tests: Recent Labs  Lab 07/31/23 0359 08/01/23 0418 08/03/23 0401  AST 46* 111* 39  ALT 49* 128* 65*  ALKPHOS 243* 494* 296*  BILITOT 0.7 0.8 0.9  PROT 6.4* 6.0* 6.6  ALBUMIN 2.2* 2.2* 2.5*   No results for input(s): "LIPASE", "AMYLASE" in the last 168 hours. No results for input(s): "AMMONIA" in the last 168 hours.  CBC: Recent Labs    07/29/23 0427 07/30/23 1430 08/01/23 0418 08/02/23 0817 08/03/23 0401  HGB 8.3* 8.6* 8.4* 8.8* 9.4*  MCV 94.2 94.8 94.6 94.1 92.9    Cardiac Enzymes: No results for input(s): "CKTOTAL", "CKMB", "CKMBINDEX", "TROPONINI" in the last 168 hours. CBG: Recent Labs  Lab 08/02/23 0817 08/02/23 1140 08/02/23 1824 08/02/23 2058 08/03/23 0824  GLUCAP 122* 129* 108* 113* 108*    Iron Studies: No results for input(s): "IRON", "TIBC", "TRANSFERRIN", "FERRITIN" in the last 72  hours. Studies/Results: ECHOCARDIOGRAM COMPLETE Result Date: 08/01/2023    ECHOCARDIOGRAM REPORT   Patient Name:   Aaron [name] Date of Exam: 08/01/2023 Medical Rec #:  [id number]    Height:       76.0 in Accession #:    [id number]   Weight:       173.1 lb Date of Birth:  1985/08/17    BSA:          2.083 m Patient Age:    37 years     BP:           150/92 mmHg Patient Gender: M            HR:           82 bpm. Exam Location:  Inpatient Procedure: 2D Echo (Both Spectral and Color Flow Doppler were utilized during            procedure). Indications:    mitral valve disorder  History:        Patient has prior history of Echocardiogram examinations, most                 recent 07/21/2023. Arrythmias:Cardiac Arrest; Risk                 Factors:Hypertension.                  Mitral Valve: Mitra-Clip valve is present in the mitral                 position. Procedure Date: 07/16/2023.  Sonographer:    Delcie Roch RDCS Referring Phys: [phone] ALMA L DIAZ IMPRESSIONS  1. Left ventricular ejection fraction, by estimation, is 40 to 45%. Left ventricular ejection fraction by PLAX is 41 %. The left ventricle has mildly decreased function. The left ventricle demonstrates global hypokinesis. There is mild left ventricular hypertrophy. Left ventricular diastolic parameters are consistent with Grade I diastolic dysfunction (impaired relaxation).  2. Right ventricular systolic function is normal. The right ventricular size is normal. Tricuspid regurgitation signal is inadequate for assessing PA pressure.  3. Left atrial size was mildly dilated.  4. Right atrial size was mildly dilated.  5. 2 Mitra-clips noted in the A2-P2 position. The valve is hypermobile with the  SAM of the anterior mitral leaflet, but no outflow obstruction - there is moderate stenosis with an expected double oreface valve. The mitral valve has been repaired/replaced. Mild mitral valve regurgitation. Moderate mitral stenosis. The mean mitral valve gradient is  7.0 mmHg with average heart rate of 79 bpm. There is a Mitra-Clip present in the mitral position. Procedure Date: 07/16/2023.  6. The aortic valve is tricuspid. Aortic valve regurgitation is trivial. Aortic valve sclerosis/calcification is present, without any evidence of aortic stenosis.  7. The inferior vena cava is normal in size with greater than 50% respiratory variability, suggesting right atrial pressure of 3 mmHg.  8. Evidence of atrial level shunting detected by color flow Doppler. There is a small secundum atrial septal defect with predominantly left to right shunting across the atrial septum. Comparison(s): Changes from prior study are noted. 07/21/2023: LVEF 35-40%. FINDINGS  Left Ventricle: Left ventricular ejection fraction, by estimation, is 40 to 45%. Left ventricular ejection fraction by PLAX is 41 %. The left ventricle has mildly decreased function. The left ventricle demonstrates global hypokinesis. The left ventricular internal cavity size was normal in size. There is mild left ventricular hypertrophy. Left ventricular diastolic parameters are consistent with Grade I diastolic dysfunction (impaired relaxation). Indeterminate filling pressures. Right Ventricle: The right ventricular size is normal. No increase in right ventricular wall thickness. Right ventricular systolic function is normal. Tricuspid regurgitation signal is inadequate for assessing PA pressure. Left Atrium: Left atrial size was mildly dilated. Right Atrium: Right atrial size was mildly dilated. Pericardium: There is no evidence of pericardial effusion. Mitral Valve: 2 Mitra-clips noted in the A2-P2 position. The valve is hypermobile with the SAM of the anterior mitral leaflet, but no outflow obstruction - there is moderate stenosis with an expected double oreface valve. The mitral valve has been repaired/replaced. Mild mitral valve regurgitation. There is a Mitra-Clip present in the mitral position. Procedure Date: 07/16/2023.  Moderate mitral valve stenosis. MV peak gradient, 15.1 mmHg. The mean mitral valve gradient is 7.0 mmHg with average heart  rate of 79 bpm. Tricuspid Valve: The tricuspid valve is grossly normal. Tricuspid valve regurgitation is trivial. Aortic Valve: The aortic valve is tricuspid. Aortic valve regurgitation is trivial. Aortic valve sclerosis/calcification is present, without any evidence of aortic stenosis. Pulmonic Valve: The pulmonic valve was normal in structure. Pulmonic valve regurgitation is not visualized. Aorta: The aortic root and ascending aorta are structurally normal, with no evidence of dilitation. Venous: The inferior vena cava is normal in size with greater than 50% respiratory variability, suggesting right atrial pressure of 3 mmHg. IAS/Shunts: Evidence of atrial level shunting detected by color flow Doppler. There is a small secundum atrial septal defect with predominantly left to right shunting across the atrial septum.  LEFT VENTRICLE PLAX 2D LV EF:         Left            Diastology                ventricular     LV e' medial:    5.98 cm/s                ejection        LV E/e' medial:  23.9                fraction by     LV e' lateral:   7.62 cm/s                PLAX is  41      LV E/e' lateral: 18.8                %. LVIDd:         5.50 cm LVIDs:         4.40 cm LV PW:         1.00 cm LV IVS:        1.10 cm LVOT diam:     2.60 cm LV SV:         104 LV SV Index:   50 LVOT Area:     5.31 cm  RIGHT VENTRICLE             IVC RV Basal diam:  3.10 cm     IVC diam: 1.80 cm RV S prime:     16.30 cm/s TAPSE (M-mode): 2.5 cm LEFT ATRIUM             Index        RIGHT ATRIUM           Index LA diam:        4.00 cm 1.92 cm/m   RA Area:     19.10 cm LA Vol (A2C):   84.7 ml 40.66 ml/m  RA Volume:   48.40 ml  23.23 ml/m LA Vol (A4C):   72.4 ml 34.76 ml/m LA Biplane Vol: 82.7 ml 39.70 ml/m  AORTIC VALVE LVOT Vmax:   130.00 cm/s LVOT Vmean:  85.000 cm/s LVOT VTI:    0.196 m  AORTA Ao Root diam: 3.20 cm  Ao Asc diam:  3.50 cm MITRAL VALVE MV Area VTI:  1.94 cm      SHUNTS MV Peak grad: 15.1 mmHg     Systemic VTI:  0.20 m MV Mean grad: 7.0 mmHg      Systemic Diam: 2.60 cm MV Vmax:      1.94 m/s MV Vmean:     131.0 cm/s MV E velocity: 143.00 cm/s MV A velocity: 191.00 cm/s MV E/A ratio:  0.75 Zoila Shutter MD Electronically signed by Zoila Shutter MD Signature Date/Time: 08/01/2023/5:47:48 PM    Final      Medications: Infusions:  [START ON 08/04/2023]  ceFAZolin (ANCEF) IV     heparin 1,900 Units/hr (08/03/23 0707)    Scheduled Medications:  amiodarone  200 mg Oral Daily   arformoterol  15 mcg Nebulization BID   ascorbic acid  250 mg Oral BID   aspirin  81 mg Oral Daily   budesonide (PULMICORT) nebulizer solution  0.25 mg Nebulization BID   Chlorhexidine Gluconate Cloth  6 each Topical Q0600   darbepoetin (ARANESP) injection - DIALYSIS  200 mcg Subcutaneous Q Fri-1800   fiber supplement (BANATROL TF)  60 mL Oral QID   folic acid  1 mg Oral Daily   insulin aspart  0-20 Units Subcutaneous TID WC   methimazole  10 mg Oral Daily   multivitamin  1 tablet Oral QHS   mouth rinse  15 mL Mouth Rinse 4 times per day   revefenacin  175 mcg Nebulization Daily   sodium chloride flush  3 mL Intravenous Q12H   thiamine  100 mg Oral Daily   Or   thiamine  100 mg Intravenous Daily   zinc sulfate (50mg  elemental zinc)  220 mg Oral Daily    have reviewed scheduled and prn medications.  Physical Exam: General: Critically ill looking male, alert  Heart:RRR, s1s2 nl Lungs: rhonchi. Abdomen:soft, non-distended Extremities:No edema Dialysis Access: Left  subclavian temporary HD line.-  newest line placed 3/9  Norlan Rann W 08/03/2023,9:51 AM  LOS: 25 days

## 2023-08-03 NOTE — Plan of Care (Signed)
  Problem: Education: Goal: Knowledge of General Education information will improve Description: Including pain rating scale, medication(s)/side effects and non-pharmacologic comfort measures Outcome: Progressing   Problem: Health Behavior/Discharge Planning: Goal: Ability to manage health-related needs will improve Outcome: Progressing   Problem: Clinical Measurements: Goal: Ability to maintain clinical measurements within normal limits will improve Outcome: Progressing Goal: Will remain free from infection Outcome: Progressing Goal: Diagnostic test results will improve Outcome: Progressing Goal: Respiratory complications will improve Outcome: Progressing Goal: Cardiovascular complication will be avoided Outcome: Progressing   Problem: Activity: Goal: Risk for activity intolerance will decrease Outcome: Progressing   Problem: Nutrition: Goal: Adequate nutrition will be maintained Outcome: Progressing   Problem: Coping: Goal: Level of anxiety will decrease Outcome: Progressing   Problem: Elimination: Goal: Will not experience complications related to bowel motility Outcome: Progressing Goal: Will not experience complications related to urinary retention Outcome: Progressing   Problem: Pain Managment: Goal: General experience of comfort will improve and/or be controlled Outcome: Progressing   Problem: Safety: Goal: Ability to remain free from injury will improve Outcome: Progressing   Problem: Skin Integrity: Goal: Risk for impaired skin integrity will decrease Outcome: Progressing   Problem: Education: Goal: Ability to describe self-care measures that may prevent or decrease complications (Diabetes Survival Skills Education) will improve Outcome: Progressing   Problem: Coping: Goal: Ability to adjust to condition or change in health will improve Outcome: Progressing   Problem: Fluid Volume: Goal: Ability to maintain a balanced intake and output will  improve Outcome: Progressing   Problem: Health Behavior/Discharge Planning: Goal: Ability to identify and utilize available resources and services will improve Outcome: Progressing Goal: Ability to manage health-related needs will improve Outcome: Progressing   Problem: Metabolic: Goal: Ability to maintain appropriate glucose levels will improve Outcome: Progressing   Problem: Nutritional: Goal: Maintenance of adequate nutrition will improve Outcome: Progressing Goal: Progress toward achieving an optimal weight will improve Outcome: Progressing   Problem: Skin Integrity: Goal: Risk for impaired skin integrity will decrease Outcome: Progressing   Problem: Tissue Perfusion: Goal: Adequacy of tissue perfusion will improve Outcome: Progressing   Problem: Safety: Goal: Non-violent Restraint(s) Outcome: Progressing

## 2023-08-03 NOTE — Progress Notes (Signed)
 PROGRESS NOTE Aaron [name]  WUJ:[id number] DOB: [birth year] DOA: 07/09/2023 PCP: Patient, No Pcp Per  Brief Narrative/Hospital Course: 38 year old male with hypothyroidism, PAF on anticoagulation (per notes nonadherent to Eliquis), severe mitral regurgitation comes into the hospital on 2/26 with shortness of breath and admitted for acute hypoxic respiratory failure , PE,A-fib with RVR, chronic HFpEF, severe MR and hypothyroidism with TSH less than 0.01 Free T42.15-placed on heparin,also suspected thyrotoxicosis-placed on beta-blockers,resumed methimazole, steroids but he rapidly deteriorated 2/27 went to respiratory arrest with V-fib arrest.Underwent 40 minutes of CPR followed by ROSC.Post arrest course complicated by ATN requiring CRRT now on HD, he was also on ECMO status post decannulation on 3/8, also required Impella eventually removed 3/12.  He failed extubation x 1 due to mucous plugging, but eventually extubated 3/16.  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 Currently waiting for renal recovery and planning for rehabilitation   Significant events: 2/26 admitted +PE 2/27 Vtac/ Vfib arrest> rosc after 40 mins, s/p TNK,  VA ecmo 3/5 salvage TEER (transcatheter edge to edge mitral valve repair) 3/7 weaned off VA 3/8 bronch for mucus plugging, VA ecmo decannulation >impella 5.5 3/12 impella removed, extubation trial 2/28 CRRT 3/13 reintubated, bronch for recurrent plugging, HD cath rewired due to malfunction 3/15 CRRT stopped, remains anuric 3/16 awake f/c, extubated 3/22 core track discontinued  Consultation: Cardiology Nephrology Palliative care Vascular surgery Critical care.     Subjective: Seen and examined this morning, nursing giving him bath Alert awake communicative interactive no complaints Overnight afebrile BP 130s-140s systolic, on room air Labs shows creatinine further trending up 9.1 BUN 94, potassium 5.5, urine output picking up 1025  cc Temporary HD catheter in place   Assessment and Plan: Principal Problem:   Bilateral pulmonary embolism (HCC) Active Problems:   Paroxysmal atrial fibrillation (HCC)   Hyperthyroidism   Chronic heart failure with preserved ejection fraction (HFpEF) (HCC)   Severe mitral regurgitation   Cardiac arrest (HCC)   Cardiogenic shock (HCC)   Hyperkalemia   On mechanically assisted ventilation (HCC)   Acute respiratory failure with hypoxia (HCC)   Palliative care by specialist   Protein-calorie malnutrition, severe    Cardiogenic shock Acute systolic CHF Severe MR VT/NSVT:  Suspected hypoxic respiratory failure drainage with segmental PE in the background of severe MR ,nodal  blockage and thyrotoxicosis-suspected downtime about 20 minutes.  Managed by CHF/HF team, S/P ECMO with Impella, decannulated on 3/8 and Impella removed and extubated on 3/12-reintubated but eventually extubated again on 3/16 Currently doing well on room air.  Initially needed pressors, currently vitals stable. Repeat echo shows improved EF at 40-45%. Continue amiodarone for now and plans to stop on DC. GDMT limited by AKI/CKD   Acute kidney injury due to ATN Hyperkalemia Uremia: following cardiac arrest.  Required CRRT, now on IHD.nephrology managing continue HD-this morning discussed with nephrology-for HD tomorrow, giving Lokelma today.  Urine output increasing -hopefully recover otherwise he will be getting HD Recent Labs    07/26/23 0437 07/26/23 0443 07/26/23 1610 07/27/23 0500 07/27/23 1503 07/27/23 1608 07/28/23 0512 07/29/23 0427 07/30/23 0500 08/01/23 0418 08/02/23 0817 08/03/23 0401  BUN 47*  46*  --  55* 75*  75* 90*  --  116* 90* 130* 117* 83* 94*  CREATININE 3.39*  3.24*  --  4.35* 5.69*  5.80* 7.40*  --  8.93* 7.20* 9.41* 9.47* 7.70* 9.15*  CO2 21*  22  --  22 17*  20* 16*  --  18*  21* 19* 21* 23 21*  K 3.9  3.8   < > 4.1 4.4  4.3 4.4   < > 4.6 4.6 4.6 5.3* 5.1 5.5*   < > =  values in this interval not displayed.    Severe MR: S/p mitral transcatheter edge-to-edge repair (mTEER) on 07/16/2023 with placement of 3 clips and improvement in MR from severe to mild   Acute respiratory failure with hypoxia Pulmonary hypertension Acute PE: Hemodynamic stable.  Doing well on room air.  Currently on IV heparin-plan to switch to Eliquis after [facility] HD catheter sorted out   Hyponatremia: Cont fluid management with HD   PAF: In NSR. Continue oral amiodarone.  On heparin.    Hyperthyroidism Possibly thyrotoxicosis: Doing well currently.  Continue methimazole.Need to check TSH in 4 weeks. Last TSH done 3/14 at 0.03   ID: previously on Vanco meropenem and micafungin, now off antibiotics.  Cultures without growth.  Remains afebrile   Normocytic anemia: Hb stable   Transaminitis: Likely shock liver.  Monitor labs intermittently  Acute small right CVA : MRI of the head on 07/24/2022 shows marked fluid to the right cerebellar and cerebral hemisphere. Suspect embolic, continue IV heparin.   Deconditioning/debility: unclear his posthospitalization needs   Stage II sacral cubitus ulcer: Noted  Severe malnutrition: Augment diet as tolerated RD following Etiology: acute illness Signs/Symptoms: moderate fat depletion, moderate muscle depletion Interventions: Refer to RD note for recommendations  DVT prophylaxis: SCDheparin Code Status:   Code Status: Full Code Family Communication: plan of care discussed with patient at bedside. Patient status is: Remains hospitalized because of severity of illness Level of care: Telemetry Cardiac   Dispo: The patient is from: Home            Anticipated disposition: Inpatient rehab   Objective: Vitals last 24 hrs: Vitals:   08/02/23 2317 08/03/23 0235 08/03/23 0754 08/03/23 0825  BP: 135/79 138/83  (!) 142/94  Pulse: 74 69    Resp: 20 18  20   Temp: 98.7 F (37.1 C) 97.7 F (36.5 C)  98.1 F (36.7 C)  TempSrc: Oral Oral   Oral  SpO2: 99% 99% 100%   Weight:      Height:       Weight change:   Physical Examination: General exam: alert awake,at baseline,  HEENT:Oral mucosa moist, Ear/Nose WNL grossly Respiratory system: Bilaterally clear BS,no use of accessory muscle Cardiovascular system: S1 & S2 +, No JVD. Gastrointestinal system: Abdomen soft,NT,ND, BS+ Nervous System: Alert, awake, moving all extremities,and following commands. Extremities: LE edema neg,distal peripheral pulses palpable and warm.  Skin: No rashes,no icterus. MSK: Normal muscle bulk,tone, power  Temp hd cath+  Medications reviewed:  Scheduled Meds:  amiodarone  200 mg Oral Daily   arformoterol  15 mcg Nebulization BID   ascorbic acid  250 mg Oral BID   aspirin  81 mg Oral Daily   budesonide (PULMICORT) nebulizer solution  0.25 mg Nebulization BID   Chlorhexidine Gluconate Cloth  6 each Topical Q0600   darbepoetin (ARANESP) injection - DIALYSIS  200 mcg Subcutaneous Q Fri-1800   fiber supplement (BANATROL TF)  60 mL Oral QID   folic acid  1 mg Oral Daily   insulin aspart  0-20 Units Subcutaneous TID WC   methimazole  10 mg Oral Daily   multivitamin  1 tablet Oral QHS   mouth rinse  15 mL Mouth Rinse 4 times per day   revefenacin  175 mcg Nebulization Daily   sodium  chloride flush  3 mL Intravenous Q12H   thiamine  100 mg Oral Daily   Or   thiamine  100 mg Intravenous Daily   zinc sulfate (50mg  elemental zinc)  220 mg Oral Daily   Continuous Infusions:  [START ON 08/04/2023]  ceFAZolin (ANCEF) IV     heparin 1,900 Units/hr (08/03/23 0707)   Diet Order             DIET DYS 3 Room service appropriate? Yes with Assist; Fluid consistency: Honey Thick  Diet effective now                   Intake/Output Summary (Last 24 hours) at 08/03/2023 0932 Last data filed at 08/03/2023 0827 Gross per 24 hour  Intake 822.35 ml  Output 1250 ml  Net -427.65 ml   Net IO Since Admission: 1,007.08 mL [08/03/23 0932]  Wt Readings  from Last 3 Encounters:  08/02/23 77.1 kg  03/27/23 95.3 kg  03/20/23 89.2 kg     Unresulted Labs (From admission, onward)     Start     Ordered   08/02/23 0808  CBC  Daily,   R     Question:  Specimen collection method  Answer:  Lab=Lab collect   08/02/23 0807   08/02/23 0347  Basic metabolic panel  Daily,   R     Question:  Specimen collection method  Answer:  Lab=Lab collect   08/02/23 0807   07/26/23 0500  Heparin level (unfractionated)  Daily,   R     Question:  Specimen collection method  Answer:  Unit=Unit collect   07/24/23 1724           Data Reviewed: I have personally reviewed following labs and imaging studies CBC: Recent Labs  Lab 07/29/23 0427 07/30/23 1430 08/01/23 0418 08/02/23 0817 08/03/23 0401  WBC 17.6* 14.1* 9.9 8.5 8.5  HGB 8.3* 8.6* 8.4* 8.8* 9.4*  HCT 26.2* 27.1* 26.2* 27.3* 28.7*  MCV 94.2 94.8 94.6 94.1 92.9  PLT 357 433* 432* 398 421*   Basic Metabolic Panel:  Recent Labs  Lab 07/27/23 1503 07/27/23 1608 07/28/23 0512 07/29/23 0427 07/30/23 0500 08/01/23 0418 08/02/23 0817 08/03/23 0401  NA 134*   < > 133* 132* 131* 129* 130* 130*  K 4.4   < > 4.6 4.6 4.6 5.3* 5.1 5.5*  CL 102  --  103 97* 95* 95* 94* 94*  CO2 16*  --  18* 21* 19* 21* 23 21*  GLUCOSE 148*  --  170* 151* 170* 156* 132* 91  BUN 90*  --  116* 90* 130* 117* 83* 94*  CREATININE 7.40*  --  8.93* 7.20* 9.41* 9.47* 7.70* 9.15*  CALCIUM 7.9*  --  7.9* 7.7* 7.9* 7.6* 8.0* 8.2*  MG  --   --  2.6* 2.3  --  2.3  --  2.2  PHOS 4.1  --  4.3 5.0* 5.3* 5.8*  --   --    < > = values in this interval not displayed.   GFR: Estimated Creatinine Clearance: 11.9 mL/min (A) (by C-G formula based on SCr of 9.15 mg/dL (H)). Liver Function Tests:  Recent Labs  Lab 07/29/23 0427 07/30/23 0500 07/31/23 0359 08/01/23 0418 08/03/23 0401  AST 396* 60* 46* 111* 39  ALT 147* 72* 49* 128* 65*  ALKPHOS 500* 282* 243* 494* 296*  BILITOT 0.9 0.9 0.7 0.8 0.9  PROT 6.6 6.1* 6.4* 6.0* 6.6   ALBUMIN 2.2* 2.1*  2.1* 2.2*  2.2* 2.5*  No results for input(s): "PROCALCITON", "LATICACIDVEN" in the last 168 hours. No results found for this or any previous visit (from the past 240 hours).  Antimicrobials/Microbiology: Anti-infectives (From admission, onward)    Start     Dose/Rate Route Frequency Ordered Stop   08/04/23 0800  ceFAZolin (ANCEF) IVPB 2g/100 mL premix        2 g 200 mL/hr over 30 Minutes Intravenous On call 08/01/23 1627 08/05/23 0800   08/04/23 0600  ceFAZolin (ANCEF) IVPB 2g/100 mL premix  Status:  Discontinued        2 g 200 mL/hr over 30 Minutes Intravenous  Once 08/01/23 1522 08/01/23 1535   08/02/23 0800  ceFAZolin (ANCEF) IVPB 2g/100 mL premix  Status:  Discontinued        2 g 200 mL/hr over 30 Minutes Intravenous On call 08/01/23 1608 08/01/23 1627   08/01/23 1630  ceFAZolin (ANCEF) IVPB 2g/100 mL premix  Status:  Discontinued        2 g 200 mL/hr over 30 Minutes Intravenous On call 08/01/23 1535 08/01/23 1608   07/23/23 1400  vancomycin (VANCOREADY) IVPB 1250 mg/250 mL  Status:  Discontinued        1,250 mg 166.7 mL/hr over 90 Minutes Intravenous Every 24 hours 07/23/23 1259 07/23/23 1306   07/23/23 1400  Vancomycin (VANCOCIN) 1,250 mg in sodium chloride 0.9 % 250 mL IVPB  Status:  Discontinued       Note to Pharmacy: Indication: Sepsis   1,250 mg 166.7 mL/hr over 90 Minutes Intravenous Every 24 hours 07/23/23 1306 07/25/23 0841   07/21/23 0830  micafungin (MYCAMINE) 200 mg in sodium chloride 0.9 % 100 mL IVPB  Status:  Discontinued        200 mg 110 mL/hr over 1 Hours Intravenous Every 24 hours 07/21/23 0737 07/25/23 0841   07/20/23 1000  micafungin (MYCAMINE) 100 mg in sodium chloride 0.9 % 100 mL IVPB  Status:  Discontinued       Placed in "Followed by" Linked Group   100 mg 105 mL/hr over 1 Hours Intravenous Every 24 hours 07/19/23 1141 07/20/23 0949   07/19/23 1200  micafungin (MYCAMINE) 200 mg in sodium chloride 0.9 % 100 mL IVPB       Placed in  "Followed by" Linked Group   200 mg 110 mL/hr over 1 Hours Intravenous  Once 07/19/23 1141 07/19/23 1506   07/17/23 1000  vancomycin (VANCOREADY) IVPB 1500 mg/300 mL  Status:  Discontinued        1,500 mg 150 mL/hr over 120 Minutes Intravenous Every 24 hours 07/16/23 1352 07/23/23 1259   07/16/23 1100  ceFAZolin (ANCEF) IVPB 2g/100 mL premix  Status:  Discontinued        2 g 200 mL/hr over 30 Minutes Intravenous On call 07/15/23 1750 07/15/23 1751   07/16/23 1030  ceFAZolin (ANCEF) IVPB 2g/100 mL premix        2 g 200 mL/hr over 30 Minutes Intravenous  Once 07/15/23 1751 07/16/23 1111   07/12/23 1000  vancomycin (VANCOCIN) IVPB 1000 mg/200 mL premix  Status:  Discontinued        1,000 mg 200 mL/hr over 60 Minutes Intravenous Every 24 hours 07/11/23 1605 07/16/23 1352   07/11/23 2200  meropenem (MERREM) 1 g in sodium chloride 0.9 % 100 mL IVPB  Status:  Discontinued        1 g 200 mL/hr over 30 Minutes Intravenous Every 8 hours 07/11/23 1826 07/26/23 0845  07/11/23 1000  vancomycin (VANCOREADY) IVPB 2000 mg/400 mL        2,000 mg 200 mL/hr over 120 Minutes Intravenous  Once 07/11/23 0854 07/11/23 1134   07/10/23 1700  cefTRIAXone (ROCEPHIN) 2 g in sodium chloride 0.9 % 100 mL IVPB  Status:  Discontinued        2 g 200 mL/hr over 30 Minutes Intravenous Every 24 hours 07/10/23 1653 07/11/23 1826         Component Value Date/Time   SDES TRACHEAL ASPIRATE 07/24/2023 0517   SPECREQUEST NONE 07/24/2023 0517   CULT  07/24/2023 0517    FEW Normal respiratory flora-no Staph aureus or Pseudomonas seen Performed at [facility], [address], [place], [place] [address]    REPTSTATUS 07/26/2023 FINAL 07/24/2023 0517     Radiology Studies: ECHOCARDIOGRAM COMPLETE Result Date: 08/01/2023    ECHOCARDIOGRAM REPORT   Patient Name:   Aaron [name] Date of Exam: 08/01/2023 Medical Rec #:  [id number]    Height:       76.0 in Accession #:    [id number]   Weight:       173.1 lb Date of Birth:   [birth year]    BSA:          2.083 m Patient Age:    37 years     BP:           150/92 mmHg Patient Gender: M            HR:           82 bpm. Exam Location:  Inpatient Procedure: 2D Echo (Both Spectral and Color Flow Doppler were utilized during            procedure). Indications:    mitral valve disorder  History:        Patient has prior history of Echocardiogram examinations, most                 recent 07/21/2023. Arrythmias:Cardiac Arrest; Risk                 Factors:Hypertension.                  Mitral Valve: Mitra-Clip valve is present in the mitral                 position. Procedure Date: 07/16/2023.  Sonographer:    Delcie Roch RDCS Referring Phys: [id number] ALMA L DIAZ IMPRESSIONS  1. Left ventricular ejection fraction, by estimation, is 40 to 45%. Left ventricular ejection fraction by PLAX is 41 %. The left ventricle has mildly decreased function. The left ventricle demonstrates global hypokinesis. There is mild left ventricular hypertrophy. Left ventricular diastolic parameters are consistent with Grade I diastolic dysfunction (impaired relaxation).  2. Right ventricular systolic function is normal. The right ventricular size is normal. Tricuspid regurgitation signal is inadequate for assessing PA pressure.  3. Left atrial size was mildly dilated.  4. Right atrial size was mildly dilated.  5. 2 Mitra-clips noted in the A2-P2 position. The valve is hypermobile with the SAM of the anterior mitral leaflet, but no outflow obstruction - there is moderate stenosis with an expected double oreface valve. The mitral valve has been repaired/replaced. Mild mitral valve regurgitation. Moderate mitral stenosis. The mean mitral valve gradient is 7.0 mmHg with average heart rate of 79 bpm. There is a Mitra-Clip present in the mitral position. Procedure Date: 07/16/2023.  6. The aortic valve is tricuspid. Aortic valve regurgitation  is trivial. Aortic valve sclerosis/calcification is present, without any evidence of  aortic stenosis.  7. The inferior vena cava is normal in size with greater than 50% respiratory variability, suggesting right atrial pressure of 3 mmHg.  8. Evidence of atrial level shunting detected by color flow Doppler. There is a small secundum atrial septal defect with predominantly left to right shunting across the atrial septum. Comparison(s): Changes from prior study are noted. 07/21/2023: LVEF 35-40%. FINDINGS  Left Ventricle: Left ventricular ejection fraction, by estimation, is 40 to 45%. Left ventricular ejection fraction by PLAX is 41 %. The left ventricle has mildly decreased function. The left ventricle demonstrates global hypokinesis. The left ventricular internal cavity size was normal in size. There is mild left ventricular hypertrophy. Left ventricular diastolic parameters are consistent with Grade I diastolic dysfunction (impaired relaxation). Indeterminate filling pressures. Right Ventricle: The right ventricular size is normal. No increase in right ventricular wall thickness. Right ventricular systolic function is normal. Tricuspid regurgitation signal is inadequate for assessing PA pressure. Left Atrium: Left atrial size was mildly dilated. Right Atrium: Right atrial size was mildly dilated. Pericardium: There is no evidence of pericardial effusion. Mitral Valve: 2 Mitra-clips noted in the A2-P2 position. The valve is hypermobile with the SAM of the anterior mitral leaflet, but no outflow obstruction - there is moderate stenosis with an expected double oreface valve. The mitral valve has been repaired/replaced. Mild mitral valve regurgitation. There is a Mitra-Clip present in the mitral position. Procedure Date: 07/16/2023. Moderate mitral valve stenosis. MV peak gradient, 15.1 mmHg. The mean mitral valve gradient is 7.0 mmHg with average heart  rate of 79 bpm. Tricuspid Valve: The tricuspid valve is grossly normal. Tricuspid valve regurgitation is trivial. Aortic Valve: The aortic valve is  tricuspid. Aortic valve regurgitation is trivial. Aortic valve sclerosis/calcification is present, without any evidence of aortic stenosis. Pulmonic Valve: The pulmonic valve was normal in structure. Pulmonic valve regurgitation is not visualized. Aorta: The aortic root and ascending aorta are structurally normal, with no evidence of dilitation. Venous: The inferior vena cava is normal in size with greater than 50% respiratory variability, suggesting right atrial pressure of 3 mmHg. IAS/Shunts: Evidence of atrial level shunting detected by color flow Doppler. There is a small secundum atrial septal defect with predominantly left to right shunting across the atrial septum.  LEFT VENTRICLE PLAX 2D LV EF:         Left            Diastology                ventricular     LV e' medial:    5.98 cm/s                ejection        LV E/e' medial:  23.9                fraction by     LV e' lateral:   7.62 cm/s                PLAX is 41      LV E/e' lateral: 18.8                %. LVIDd:         5.50 cm LVIDs:         4.40 cm LV PW:         1.00 cm LV IVS:        1.10 cm  LVOT diam:     2.60 cm LV SV:         104 LV SV Index:   50 LVOT Area:     5.31 cm  RIGHT VENTRICLE             IVC RV Basal diam:  3.10 cm     IVC diam: 1.80 cm RV S prime:     16.30 cm/s TAPSE (M-mode): 2.5 cm LEFT ATRIUM             Index        RIGHT ATRIUM           Index LA diam:        4.00 cm 1.92 cm/m   RA Area:     19.10 cm LA Vol (A2C):   84.7 ml 40.66 ml/m  RA Volume:   48.40 ml  23.23 ml/m LA Vol (A4C):   72.4 ml 34.76 ml/m LA Biplane Vol: 82.7 ml 39.70 ml/m  AORTIC VALVE LVOT Vmax:   130.00 cm/s LVOT Vmean:  85.000 cm/s LVOT VTI:    0.196 m  AORTA Ao Root diam: 3.20 cm Ao Asc diam:  3.50 cm MITRAL VALVE MV Area VTI:  1.94 cm      SHUNTS MV Peak grad: 15.1 mmHg     Systemic VTI:  0.20 m MV Mean grad: 7.0 mmHg      Systemic Diam: 2.60 cm MV Vmax:      1.94 m/s MV Vmean:     131.0 cm/s MV E velocity: 143.00 cm/s MV A velocity: 191.00 cm/s  MV E/A ratio:  0.75 Zoila Shutter MD Electronically signed by Zoila Shutter MD Signature Date/Time: 08/01/2023/5:47:48 PM    Final      LOS: 25 days   Total time spent in review of labs and imaging, patient evaluation, formulation of plan, documentation and communication with family: 35 minutes  Lanae Boast, MD  [facility]  08/03/2023, 9:32 AM

## 2023-08-03 NOTE — Progress Notes (Signed)
 Speech Language Pathology Treatment: Dysphagia  Patient Details Name: Aaron [name] MRN: [id number] DOB: 12-08-1985 Today's Date: 08/03/2023 Time: 2952-8413 SLP Time Calculation (min) (ACUTE ONLY): 20 min  Assessment / Plan / Recommendation Clinical Impression  Pt progressing well towards SLP goals. His vocal quality has even further improved, although still appears hoarse. Pt reports consistently using EMST. Today, he completed 10 reps set to the maximum setting of 55 cm/H2O easily. His attention to POs has improved, masticating solids thoroughly. Trialed both thin and honey thick water without observing a wet vocal quality. Will need to repeat MBS prior to upgrading consistency of liquids given severity of oropharyngeal dysphagia; however, recommend upgrading diet to regular and continuing honey thick liquids. SLP will f/u subsequent date to repeat MBS pending HD schedule.    HPI HPI: [name] is a 38 yo male presenting to ED 2/26 with chest pain and dyspnea. Admitted with bilateral lower lobe pulmonary emboli. VT/VF cardiac arrest 2/27, requiring 40 minutes of CPR and multiple shocks. Impella placed 2/27-3/12. ECMO initiated 2/27-3/8. CRRT stopped 3/15. ETT 2/27-3/12, reintubated 3/12-3/16 s/p mucus plugging and L lung opacification. 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 PMH includes hypothyroidism, paroxysmal A-fib, severe mitral regurgitation      SLP Plan  MBS      Recommendations for follow up therapy are one component of a multi-disciplinary discharge planning process, led by the attending physician.  Recommendations may be updated based on patient status, additional functional criteria and insurance authorization.    Recommendations  Diet recommendations: Regular;Honey-thick liquid Liquids provided via: Teaspoon;Cup Medication Administration: Whole meds with puree Supervision:  Staff to assist with self feeding;Full supervision/cueing for compensatory strategies Compensations: Minimize environmental distractions;Slow rate;Small sips/bites Postural Changes and/or Swallow Maneuvers: Seated upright 90 degrees                  Oral care BID   Frequent or constant Supervision/Assistance Dysphagia, oropharyngeal phase (R13.12)     MBS     Gwynneth Aliment, M.A., CF-SLP Speech Language Pathology, Acute Rehabilitation Services  Secure Chat preferred [phone]   08/03/2023, 3:47 PM

## 2023-08-03 NOTE — Progress Notes (Signed)
 PHARMACY - ANTICOAGULATION CONSULT NOTE  Pharmacy Consult for Heparin  Indication: pulmonary embolus, s/p ECMO decannulation 3/7, Impella removal 3/12  No Known Allergies  Patient Measurements: Height: 6\' 4"  (193 cm) Weight: 77.1 kg (169 lb 15.6 oz) IBW/kg (Calculated) : 86.8 Heparin Dosing Weight: n/a  Vital Signs: Temp: 97.7 F (36.5 C) (03/23 0235) Temp Source: Oral (03/23 0235) BP: 138/83 (03/23 0235) Pulse Rate: 69 (03/23 0235)  Labs: Recent Labs    08/01/23 0418 08/02/23 0422 08/02/23 0817 08/03/23 0401  HGB 8.4*  --  8.8* 9.4*  HCT 26.2*  --  27.3* 28.7*  PLT 432*  --  398 421*  HEPARINUNFRC 0.47 0.34  --  0.45  CREATININE 9.47*  --  7.70* 9.15*    Estimated Creatinine Clearance: 11.9 mL/min (A) (by C-G formula based on SCr of 9.15 mg/dL (H)).   Medical History: Past Medical History:  Diagnosis Date   Atrial fibrillation [facility])    Hyperthyroidism    Mitral regurgitation    NSTEMI (non-ST elevated myocardial infarction) [facility])     Assessment: 38 yo M with bilateral PE s/p TNK (2/27 @1156 ) now on [place] ECMO + Impella. Pharmacy consulted for bivalirudin for anticoagulation.   S/p impella CP >5.5 3/3 - bivalirudin was previously held with oozing at insertion site but restart 3/4. Patient underwent mitraclip 3/5.  Bival transitioned to heparin 3/7 after decannulation.  Impella 5.5 removed 3/13. Heparin restarted 3/13. Hep in CRRT stopped 3/15 with CRRT stopping.   3/23 AM: Heparin level 0.45, therapeutic on 1900 units/hr. No issues with infusion running or signs of bleeding noted per RN. Hgb increased to 9.4 today, PLT stable at 421. Planning to begin apixaban once permanent HD line placed.  Goal of Therapy:  Heparin level 0.3-0.7 units/mL Monitor platelets by anticoagulation protocol: Yes   Plan:  Continue heparin infusion at 1900 units/hr Heparin level and CBC daily  Monitor s/sx of bleeding   Enos Fling, PharmD PGY-1 Acute Care Pharmacy  Resident 08/03/2023 8:05 AM  [facility] pharmacy phone numbers are listed on [email or web address]

## 2023-08-04 ENCOUNTER — Inpatient Hospital Stay (HOSPITAL_COMMUNITY)
Admission: AD | Admit: 2023-08-04 | Discharge: 2023-08-14 | DRG: 945 | Disposition: A | Discharge status: Home / Self Care | Source: Intra-hospital | Attending: Physical Medicine & Rehabilitation | Admitting: Physical Medicine & Rehabilitation

## 2023-08-04 ENCOUNTER — Other Ambulatory Visit: Payer: Self-pay

## 2023-08-04 ENCOUNTER — Inpatient Hospital Stay (HOSPITAL_COMMUNITY)

## 2023-08-04 ENCOUNTER — Encounter (HOSPITAL_COMMUNITY): Payer: Self-pay | Admitting: Physical Medicine & Rehabilitation

## 2023-08-04 DIAGNOSIS — Z992 Dependence on renal dialysis: Secondary | ICD-10-CM | POA: Diagnosis not present

## 2023-08-04 DIAGNOSIS — D649 Anemia, unspecified: Secondary | ICD-10-CM | POA: Diagnosis present

## 2023-08-04 DIAGNOSIS — J9601 Acute respiratory failure with hypoxia: Secondary | ICD-10-CM | POA: Diagnosis not present

## 2023-08-04 DIAGNOSIS — Z6823 Body mass index (BMI) 23.0-23.9, adult: Secondary | ICD-10-CM

## 2023-08-04 DIAGNOSIS — Z515 Encounter for palliative care: Secondary | ICD-10-CM

## 2023-08-04 DIAGNOSIS — E440 Moderate protein-calorie malnutrition: Secondary | ICD-10-CM | POA: Diagnosis present

## 2023-08-04 DIAGNOSIS — I469 Cardiac arrest, cause unspecified: Secondary | ICD-10-CM | POA: Diagnosis not present

## 2023-08-04 DIAGNOSIS — R1310 Dysphagia, unspecified: Secondary | ICD-10-CM | POA: Diagnosis present

## 2023-08-04 DIAGNOSIS — G931 Anoxic brain damage, not elsewhere classified: Secondary | ICD-10-CM | POA: Diagnosis present

## 2023-08-04 DIAGNOSIS — I471 Supraventricular tachycardia, unspecified: Secondary | ICD-10-CM | POA: Diagnosis present

## 2023-08-04 DIAGNOSIS — I5021 Acute systolic (congestive) heart failure: Secondary | ICD-10-CM | POA: Diagnosis not present

## 2023-08-04 DIAGNOSIS — R079 Chest pain, unspecified: Secondary | ICD-10-CM | POA: Diagnosis not present

## 2023-08-04 DIAGNOSIS — Z8674 Personal history of sudden cardiac arrest: Secondary | ICD-10-CM

## 2023-08-04 DIAGNOSIS — Z79899 Other long term (current) drug therapy: Secondary | ICD-10-CM | POA: Diagnosis not present

## 2023-08-04 DIAGNOSIS — I272 Pulmonary hypertension, unspecified: Secondary | ICD-10-CM | POA: Diagnosis present

## 2023-08-04 DIAGNOSIS — I69398 Other sequelae of cerebral infarction: Secondary | ICD-10-CM

## 2023-08-04 DIAGNOSIS — I63549 Cerebral infarction due to unspecified occlusion or stenosis of unspecified cerebellar artery: Secondary | ICD-10-CM

## 2023-08-04 DIAGNOSIS — K7200 Acute and subacute hepatic failure without coma: Secondary | ICD-10-CM | POA: Diagnosis not present

## 2023-08-04 DIAGNOSIS — Z5986 Financial insecurity: Secondary | ICD-10-CM

## 2023-08-04 DIAGNOSIS — Z7982 Long term (current) use of aspirin: Secondary | ICD-10-CM

## 2023-08-04 DIAGNOSIS — I340 Nonrheumatic mitral (valve) insufficiency: Secondary | ICD-10-CM | POA: Diagnosis present

## 2023-08-04 DIAGNOSIS — K5900 Constipation, unspecified: Secondary | ICD-10-CM | POA: Diagnosis not present

## 2023-08-04 DIAGNOSIS — E872 Acidosis, unspecified: Secondary | ICD-10-CM | POA: Diagnosis not present

## 2023-08-04 DIAGNOSIS — I2782 Chronic pulmonary embolism: Secondary | ICD-10-CM | POA: Diagnosis not present

## 2023-08-04 DIAGNOSIS — N170 Acute kidney failure with tubular necrosis: Secondary | ICD-10-CM | POA: Diagnosis not present

## 2023-08-04 DIAGNOSIS — E0590 Thyrotoxicosis, unspecified without thyrotoxic crisis or storm: Secondary | ICD-10-CM | POA: Diagnosis present

## 2023-08-04 DIAGNOSIS — R5381 Other malaise: Principal | ICD-10-CM | POA: Diagnosis present

## 2023-08-04 DIAGNOSIS — N189 Chronic kidney disease, unspecified: Secondary | ICD-10-CM | POA: Diagnosis present

## 2023-08-04 DIAGNOSIS — E039 Hypothyroidism, unspecified: Secondary | ICD-10-CM | POA: Diagnosis present

## 2023-08-04 DIAGNOSIS — I69321 Dysphasia following cerebral infarction: Secondary | ICD-10-CM

## 2023-08-04 DIAGNOSIS — G4700 Insomnia, unspecified: Secondary | ICD-10-CM | POA: Diagnosis present

## 2023-08-04 DIAGNOSIS — Z7951 Long term (current) use of inhaled steroids: Secondary | ICD-10-CM

## 2023-08-04 DIAGNOSIS — R110 Nausea: Secondary | ICD-10-CM | POA: Diagnosis not present

## 2023-08-04 DIAGNOSIS — R7401 Elevation of levels of liver transaminase levels: Secondary | ICD-10-CM | POA: Diagnosis not present

## 2023-08-04 DIAGNOSIS — N179 Acute kidney failure, unspecified: Secondary | ICD-10-CM

## 2023-08-04 DIAGNOSIS — I69391 Dysphagia following cerebral infarction: Secondary | ICD-10-CM

## 2023-08-04 DIAGNOSIS — R570 Cardiogenic shock: Secondary | ICD-10-CM | POA: Diagnosis not present

## 2023-08-04 DIAGNOSIS — R0602 Shortness of breath: Secondary | ICD-10-CM | POA: Diagnosis not present

## 2023-08-04 DIAGNOSIS — E875 Hyperkalemia: Secondary | ICD-10-CM | POA: Diagnosis not present

## 2023-08-04 DIAGNOSIS — I429 Cardiomyopathy, unspecified: Secondary | ICD-10-CM | POA: Diagnosis present

## 2023-08-04 DIAGNOSIS — I472 Ventricular tachycardia, unspecified: Secondary | ICD-10-CM | POA: Diagnosis present

## 2023-08-04 DIAGNOSIS — I2699 Other pulmonary embolism without acute cor pulmonale: Secondary | ICD-10-CM | POA: Diagnosis not present

## 2023-08-04 DIAGNOSIS — I342 Nonrheumatic mitral (valve) stenosis: Secondary | ICD-10-CM | POA: Diagnosis not present

## 2023-08-04 DIAGNOSIS — Z7901 Long term (current) use of anticoagulants: Secondary | ICD-10-CM

## 2023-08-04 DIAGNOSIS — N186 End stage renal disease: Secondary | ICD-10-CM | POA: Diagnosis not present

## 2023-08-04 DIAGNOSIS — I4891 Unspecified atrial fibrillation: Secondary | ICD-10-CM

## 2023-08-04 DIAGNOSIS — I480 Paroxysmal atrial fibrillation: Secondary | ICD-10-CM | POA: Diagnosis present

## 2023-08-04 DIAGNOSIS — I2510 Atherosclerotic heart disease of native coronary artery without angina pectoris: Secondary | ICD-10-CM | POA: Diagnosis present

## 2023-08-04 DIAGNOSIS — R739 Hyperglycemia, unspecified: Secondary | ICD-10-CM | POA: Diagnosis not present

## 2023-08-04 DIAGNOSIS — I252 Old myocardial infarction: Secondary | ICD-10-CM

## 2023-08-04 DIAGNOSIS — I5022 Chronic systolic (congestive) heart failure: Secondary | ICD-10-CM | POA: Diagnosis present

## 2023-08-04 DIAGNOSIS — I5020 Unspecified systolic (congestive) heart failure: Secondary | ICD-10-CM | POA: Diagnosis not present

## 2023-08-04 LAB — BASIC METABOLIC PANEL
Anion gap: 13 (ref 5–15)
BUN: 104 mg/dL — ABNORMAL HIGH (ref 6–20)
CO2: 22 mmol/L (ref 22–32)
Calcium: 7.9 mg/dL — ABNORMAL LOW (ref 8.9–10.3)
Chloride: 95 mmol/L — ABNORMAL LOW (ref 98–111)
Creatinine, Ser: 10.47 mg/dL — ABNORMAL HIGH (ref 0.61–1.24)
GFR, Estimated: 6 mL/min — ABNORMAL LOW (ref >60)
Glucose, Bld: 95 mg/dL (ref 70–99)
Potassium: 5.5 mmol/L — ABNORMAL HIGH (ref 3.5–5.1)
Sodium: 130 mmol/L — ABNORMAL LOW (ref 135–145)

## 2023-08-04 LAB — CBC
HCT: 28.2 % — ABNORMAL LOW (ref 39.0–52.0)
Hemoglobin: 9.2 g/dL — ABNORMAL LOW (ref 13.0–17.0)
MCH: 30.1 pg (ref 26.0–34.0)
MCHC: 32.6 g/dL (ref 30.0–36.0)
MCV: 92.2 fL (ref 80.0–100.0)
Platelets: 407 10*3/uL — ABNORMAL HIGH (ref 150–400)
RBC: 3.06 MIL/uL — ABNORMAL LOW (ref 4.22–5.81)
RDW: 16.9 % — ABNORMAL HIGH (ref 11.5–15.5)
WBC: 7.3 10*3/uL (ref 4.0–10.5)
nRBC: 0 % (ref 0.0–0.2)

## 2023-08-04 LAB — GLUCOSE, CAPILLARY
Glucose-Capillary: 88 mg/dL (ref 70–99)
Glucose-Capillary: 99 mg/dL (ref 70–99)
Glucose-Capillary: 135 mg/dL — ABNORMAL HIGH (ref 70–99)

## 2023-08-04 LAB — HEPARIN LEVEL (UNFRACTIONATED): Heparin Unfractionated: 0.47 [IU]/mL (ref 0.30–0.70)

## 2023-08-04 MED ORDER — VITAMIN C 500 MG PO TABS
250.0000 mg | ORAL_TABLET | Freq: Two times a day (BID) | ORAL | Status: DC
  Administered 2023-08-04 – 2023-08-06 (×5): 250 mg via ORAL
  Filled 2023-08-04 (×6): qty 1

## 2023-08-04 MED ORDER — THIAMINE HCL 100 MG/ML IJ SOLN: 100.0000 mg | Freq: Every day | INTRAMUSCULAR | Status: DC

## 2023-08-04 MED ORDER — ZINC SULFATE 220 (50 ZN) MG PO CAPS: 220.0000 mg | ORAL_CAPSULE | Freq: Every day | ORAL | Status: DC

## 2023-08-04 MED ORDER — LOPERAMIDE HCL 1 MG/7.5ML PO SUSP: 2.0000 mg | Freq: Two times a day (BID) | ORAL | Status: DC | PRN

## 2023-08-04 MED ORDER — ASPIRIN 81 MG PO CHEW
81.0000 mg | CHEWABLE_TABLET | Freq: Every day | ORAL | Status: DC
  Administered 2023-08-05: 81 mg via ORAL
  Filled 2023-08-04: qty 1

## 2023-08-04 MED ORDER — FOLIC ACID 1 MG PO TABS: 1.0000 mg | ORAL_TABLET | Freq: Every day | ORAL | Status: DC

## 2023-08-04 MED ORDER — BUDESONIDE 0.5 MG/2ML IN SUSP
0.2500 mg | Freq: Two times a day (BID) | RESPIRATORY_TRACT | Status: DC
  Administered 2023-08-04 – 2023-08-14 (×19): 0.25 mg via RESPIRATORY_TRACT
  Filled 2023-08-04 (×21): qty 2

## 2023-08-04 MED ORDER — OXYCODONE HCL 5 MG PO TABS
5.0000 mg | ORAL_TABLET | Freq: Four times a day (QID) | ORAL | Status: DC | PRN
  Administered 2023-08-08: 5 mg
  Filled 2023-08-04 (×2): qty 1

## 2023-08-04 MED ORDER — HEPARIN (PORCINE) 25000 UT/250ML-% IV SOLN: 1900.0000 [IU]/h | INTRAVENOUS | Status: DC

## 2023-08-04 MED ORDER — METHIMAZOLE 10 MG PO TABS
10.0000 mg | ORAL_TABLET | Freq: Every day | ORAL | Status: DC
  Administered 2023-08-05 – 2023-08-14 (×9): 10 mg via ORAL
  Filled 2023-08-04 (×10): qty 1

## 2023-08-04 MED ORDER — ASCORBIC ACID 250 MG PO TABS: 250.0000 mg | ORAL_TABLET | Freq: Two times a day (BID) | ORAL | Status: DC

## 2023-08-04 MED ORDER — ACETAMINOPHEN 325 MG PO TABS: 650.0000 mg | ORAL_TABLET | Freq: Four times a day (QID) | ORAL | Status: DC | PRN

## 2023-08-04 MED ORDER — OFF THE BEAT BOOK
Freq: Once | Status: AC
  Administered 2023-08-04: 1
  Filled 2023-08-04: qty 1

## 2023-08-04 MED ORDER — FOLIC ACID 1 MG PO TABS
1.0000 mg | ORAL_TABLET | Freq: Every day | ORAL | Status: DC
  Administered 2023-08-05: 1 mg via ORAL
  Filled 2023-08-04: qty 1

## 2023-08-04 MED ORDER — ARFORMOTEROL TARTRATE 15 MCG/2ML IN NEBU
15.0000 ug | INHALATION_SOLUTION | Freq: Two times a day (BID) | RESPIRATORY_TRACT | Status: DC
  Administered 2023-08-04 – 2023-08-14 (×19): 15 ug via RESPIRATORY_TRACT
  Filled 2023-08-04 (×20): qty 2

## 2023-08-04 MED ORDER — ARFORMOTEROL TARTRATE 15 MCG/2ML IN NEBU: 15.0000 ug | INHALATION_SOLUTION | Freq: Two times a day (BID) | RESPIRATORY_TRACT | Status: DC

## 2023-08-04 MED ORDER — GERHARDT'S BUTT CREAM: TOPICAL_CREAM | Freq: Every day | CUTANEOUS | Status: DC | PRN

## 2023-08-04 MED ORDER — LACTULOSE 10 GM/15ML PO SOLN: 30.0000 g | Freq: Two times a day (BID) | ORAL | Status: DC | PRN

## 2023-08-04 MED ORDER — DARBEPOETIN ALFA 200 MCG/0.4ML IJ SOSY: 200.0000 ug | PREFILLED_SYRINGE | INTRAMUSCULAR | Status: DC

## 2023-08-04 MED ORDER — RENA-VITE PO TABS
1.0000 | ORAL_TABLET | Freq: Every day | ORAL | Status: DC
  Administered 2023-08-04 – 2023-08-06 (×3): 1 via ORAL
  Filled 2023-08-04 (×3): qty 1

## 2023-08-04 MED ORDER — HEPARIN SODIUM (PORCINE) 1000 UNIT/ML DIALYSIS
2000.0000 [IU] | Freq: Once | INTRAMUSCULAR | Status: AC
  Administered 2023-08-04: 2000 [IU] via INTRAVENOUS_CENTRAL
  Filled 2023-08-04 (×2): qty 2

## 2023-08-04 MED ORDER — INSULIN ASPART 100 UNIT/ML IJ SOLN
0.0000 [IU] | Freq: Three times a day (TID) | INTRAMUSCULAR | Status: DC
  Administered 2023-08-05: 3 [IU] via SUBCUTANEOUS

## 2023-08-04 MED ORDER — THIAMINE MONONITRATE 100 MG PO TABS
100.0000 mg | ORAL_TABLET | Freq: Every day | ORAL | Status: DC
  Administered 2023-08-05: 100 mg via ORAL
  Filled 2023-08-04: qty 1

## 2023-08-04 MED ORDER — AMIODARONE HCL 200 MG PO TABS
200.0000 mg | ORAL_TABLET | Freq: Every day | ORAL | Status: DC
  Administered 2023-08-05 – 2023-08-06 (×2): 200 mg via ORAL
  Filled 2023-08-04 (×2): qty 1

## 2023-08-04 MED ORDER — RENA-VITE PO TABS: 1.0000 | ORAL_TABLET | Freq: Every day | ORAL | Status: DC

## 2023-08-04 MED ORDER — VITAMIN B-1 100 MG PO TABS: 100.0000 mg | ORAL_TABLET | Freq: Every day | ORAL | Status: DC

## 2023-08-04 MED ORDER — REVEFENACIN 175 MCG/3ML IN SOLN
175.0000 ug | Freq: Every day | RESPIRATORY_TRACT | Status: DC
  Administered 2023-08-05 – 2023-08-14 (×9): 175 ug via RESPIRATORY_TRACT
  Filled 2023-08-04 (×11): qty 3

## 2023-08-04 MED ORDER — BUDESONIDE 0.25 MG/2ML IN SUSP: 0.2500 mg | Freq: Two times a day (BID) | RESPIRATORY_TRACT | Status: DC

## 2023-08-04 MED ORDER — HEPARIN (PORCINE) 25000 UT/250ML-% IV SOLN
1900.0000 [IU]/h | INTRAVENOUS | Status: DC
  Administered 2023-08-04 – 2023-08-07 (×7): 1900 [IU]/h via INTRAVENOUS
  Filled 2023-08-04 (×7): qty 250

## 2023-08-04 MED ORDER — REVEFENACIN 175 MCG/3ML IN SOLN: 175.0000 ug | Freq: Every day | RESPIRATORY_TRACT | Status: DC

## 2023-08-04 MED ORDER — HEPARIN SODIUM (PORCINE) 1000 UNIT/ML IJ SOLN
2800.0000 [IU] | Freq: Once | INTRAMUSCULAR | Status: AC
  Administered 2023-08-04: 2800 [IU] via INTRAVENOUS
  Filled 2023-08-04: qty 3

## 2023-08-04 MED ORDER — AMIODARONE HCL 200 MG PO TABS: 200.0000 mg | ORAL_TABLET | Freq: Every day | ORAL | Status: DC

## 2023-08-04 MED ORDER — POLYETHYLENE GLYCOL 3350 17 G PO PACK: 17.0000 g | PACK | Freq: Every day | ORAL | Status: DC | PRN

## 2023-08-04 MED ORDER — ZINC SULFATE 220 (50 ZN) MG PO CAPS
220.0000 mg | ORAL_CAPSULE | Freq: Every day | ORAL | Status: AC
Start: 2023-08-05 — End: 2023-08-08
  Administered 2023-08-05 – 2023-08-06 (×2): 220 mg via ORAL
  Filled 2023-08-04 (×3): qty 1

## 2023-08-04 MED ORDER — ASPIRIN 81 MG PO CHEW
81.0000 mg | CHEWABLE_TABLET | Freq: Every day | ORAL | Status: DC
Start: 2023-08-05 — End: 2023-08-14

## 2023-08-04 NOTE — Plan of Care (Signed)
  Problem: Consults Goal: RH STROKE PATIENT EDUCATION Description: See Patient Education module for education specifics  Outcome: Progressing   Problem: RH BOWEL ELIMINATION Goal: RH STG MANAGE BOWEL WITH ASSISTANCE Description: STG Manage Bowel with  supervision Assistance. Outcome: Progressing   Problem: RH BLADDER ELIMINATION Goal: RH STG MANAGE BLADDER WITH ASSISTANCE Description: STG Manage Bladder With  supervision Assistance Outcome: Progressing   Problem: RH SKIN INTEGRITY Goal: RH STG SKIN FREE OF INFECTION/BREAKDOWN Outcome: Progressing   Problem: RH SAFETY Goal: RH STG ADHERE TO SAFETY PRECAUTIONS W/ASSISTANCE/DEVICE Description: STG Adhere to Safety Precautions With  supervision Assistance/Device. Outcome: Progressing   Problem: RH COGNITION-NURSING Goal: RH STG USES MEMORY AIDS/STRATEGIES W/ASSIST TO PROBLEM SOLVE Description: STG Uses Memory Aids/Strategies With  supervision Assistance to Problem Solve. Outcome: Progressing   Problem: RH PAIN MANAGEMENT Goal: RH STG PAIN MANAGED AT OR BELOW PT'S PAIN GOAL Description: <4 w/ prns Outcome: Progressing   Problem: RH KNOWLEDGE DEFICIT Goal: RH STG INCREASE KNOWLEDGE OF STROKE PROPHYLAXIS Description: Manage increases  knowledge of stroke prophylaxis  with supervision assistance using educational materials provided Outcome: Progressing

## 2023-08-04 NOTE — Progress Notes (Signed)
 Date of Service:  08/01/2023 12:47 PM  Related encounter: ED to Hosp-Admission (Discharged) from 07/09/2023 in [place] [facility] Progressive [facility]   Performed chart review to assist in evaluation of patient appropriateness for inpatient rehab admission.   Mr. Cogdell is a 39 year old male with past medical history of hypothyroidism, A-fib on anticoagulation, and severe mitral regurg who presented to the hospital on 2-26 with suspected medication noncompliance resulting in a PE , A-fib with RVR, and thyrotoxicosis.  On 2-27, he underwent respiratory and V-fib arrest, underwent 40 minutes of CPR followed by ROSC.  Postarrest course was complicated by ATN, now on hemodialysis, severe mitral regurg requiring mTEER 3-5, and ECMO transition to Impella and ultimately removed on 3-12.  MRI brain 3-13 showed small cerebellar infarcts, likely embolic.  He was extubated on 3-16.  He remains on tube feeds for malnutrition and dysphagia.   Per chart review, prior to admission, patient was independent for mobility and ADLs, and employed as a Paediatric nurse.  He lives in a single-story home with a level entry with 24/7 assistance available by his sister and girlfriend.  Girlfriend plans to work from home, and sister's job is flexible. Patient is making progress with therapies, and today performed supine to sit with mod assist and min assist to stand with rolling walker, ambulated 16 feet with a rolling walker with min assist.   Given the above information, I believe the patient is appropriate for consideration of inpatient rehab.  He has good family support and a reasonable dispo plan, but significant physical needs requiring PT, OT, and SLP services.  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  Given this, I would anticipate a longer length of stay  approximately 18 to 21 days.     Angelina Sheriff, DO 08/01/2023

## 2023-08-04 NOTE — Progress Notes (Signed)
 PHARMACY - ANTICOAGULATION CONSULT NOTE  Pharmacy Consult for Heparin  Indication: pulmonary embolus, s/p ECMO decannulation 3/7, Impella removal 3/12  No Known Allergies  Patient Measurements: Height: 6\' 4"  (193 cm) Weight: 87.9 kg (193 lb 12.6 oz) IBW/kg (Calculated) : 86.8 Heparin Dosing Weight: n/a  Vital Signs: Temp: 98.1 F (36.7 C) (03/24 1130) Temp Source: Oral (03/24 0742) BP: 126/88 (03/24 1132) Pulse Rate: 88 (03/24 1132)  Labs: Recent Labs    08/02/23 0422 08/02/23 0817 08/02/23 0817 08/03/23 0401 08/04/23 0425  HGB  --  8.8*   < > 9.4* 9.2*  HCT  --  27.3*  --  28.7* 28.2*  PLT  --  398  --  421* 407*  HEPARINUNFRC 0.34  --   --  0.45 0.47  CREATININE  --  7.70*  --  9.15* 10.47*   < > = values in this interval not displayed.    Estimated Creatinine Clearance: 11.7 mL/min (A) (by C-G formula based on SCr of 10.47 mg/dL (H)).   Medical History: Past Medical History:  Diagnosis Date   Atrial fibrillation [facility])    Hyperthyroidism    Mitral regurgitation    NSTEMI (non-ST elevated myocardial infarction) [facility])     Assessment: 38 yo M with bilateral PE s/p TNK (2/27 @1156 ) now on [place] ECMO + Impella. Pharmacy consulted for bivalirudin for anticoagulation.   S/p impella CP >5.5 3/3 - bivalirudin was previously held with oozing at insertion site but restart 3/4. Patient underwent mitraclip 3/5.  Bival transitioned to heparin 3/7 after decannulation.  Impella 5.5 removed 3/13. Heparin restarted 3/13. Hep in CRRT stopped 3/15 with CRRT stopping.   3/24 AM: Heparin level 0.47, therapeutic on 1900 units/hr. No issues with infusion running or signs of bleeding noted per RN. Hgb 9.2, pltc 407 (stable).  Deferring TC placement today pending possible renal recovery - continuing heparin, eventually transition to Eliquis pending final decision.   Goal of Therapy:  Heparin level 0.3-0.7 units/mL Monitor platelets by anticoagulation protocol: Yes   Plan:  Continue  heparin infusion at 1900 units/hr Heparin level and CBC daily  Monitor s/sx of bleeding   Trixie Rude, PharmD Clinical Pharmacist 08/04/2023  12:04 PM

## 2023-08-04 NOTE — Progress Notes (Signed)
 Inpatient Rehab Admissions Coordinator:  There is a bed available for pt today in CIR. Dr. Dayna Barker, Dr. Malen Gauze and Dr. Gala Romney are aware and in agreement. Pt, pt's sister Shanda Bumps, pt's significant other [name], NSG and TOC made aware.    Wolfgang Phoenix, MS, CCC-SLP Admissions Coordinator [phone]

## 2023-08-04 NOTE — Progress Notes (Signed)
 Received patient in bed to unit.  Alert and oriented.  Informed consent signed and in chart.   TX duration: 3 hours  Patient tolerated well.  Transported back to the room  Alert, without acute distress.  Hand-off given to patient's nurse.   Access used: R internal jugular HD Cath Access issues: in last 3 minutes A-V and V-A and BFR to 300 due to high arterial pressures  Total UF removed: 0mL Medication(s) given: Heparin drip   08/04/23 1130  Vitals  Temp 98.1 F (36.7 C)  BP 125/78  Pulse Rate 84  Resp (!) 23  Oxygen Therapy  SpO2 98 %  O2 Device Room Air  Patient Activity (if Appropriate) In bed  Pulse Oximetry Type Continuous  During Treatment Monitoring  Dialysate Potassium Concentration 2  Dialysate Calcium Concentration 2.5  Duration of HD Treatment -hour(s) 3 hour(s)  Cumulative Fluid Removed (mL) per Treatment  -54.48  Intra-Hemodialysis Comments Tolerated well  Dialysis Fluid Bolus Normal Saline  Bolus Amount (mL) 300 mL  Post Treatment  Dialyzer Clearance Lightly streaked  Liters Processed 71.7  Fluid Removed (mL) 0 mL  Tolerated HD Treatment Yes  Hemodialysis Catheter Left Subclavian  Placement Date/Time: 07/19/23 1051   Placed prior to admission: No  Time Out: Correct patient;Correct site;Correct procedure  Maximum sterile barrier precautions: Mask;Sterile gown;Sterile probe cover;Large sterile sheet;Cap;Hand hygiene;Sterile glove...  Site Condition No complications  Blue Lumen Status Dead end cap in place  Red Lumen Status Dead end cap in place  Purple Lumen Status Saline locked  Catheter fill solution Heparin 1000 units/ml  Catheter fill volume (Arterial) 1.4 cc  Catheter fill volume (Venous) 1.4  Dressing Type Transparent  Dressing Status Antimicrobial disc/dressing in place;Clean, Dry, Intact  Interventions Other (Comment) (deaccessed)  Drainage Description None  Dressing Change Due 08/08/23  Post treatment catheter status Capped and Clamped      Stacie Glaze LPN [facility]

## 2023-08-04 NOTE — Progress Notes (Signed)
 OT Cancellation Note  Patient Details Name: GILLIS BOARDLEY MRN: [id number] DOB: 25-Apr-1986   Cancelled Treatment:    Reason Eval/Treat Not Completed: Patient declined, no reason specified Pt politely declined OOB attempts at this time due to fatigue post-HD and awaiting MBS this afternoon. Will check back tomorrow.  Lorre Munroe 08/04/2023, 12:36 PM

## 2023-08-04 NOTE — Progress Notes (Signed)
 [facility] Progress Note  Name: Aaron [name] MRN: [id number] DOB: [birth year]   Subjective:  He had 2.4 liters UOP over 3/23 charted.  He was placed on HD this AM already which is ok with me.  Seen and examined on dialysis.  Procedure supervised.  Blood pressure 125/91 and HR 76.  Tolerating goal. Catheter in use.  Spoke with CHF team   Review of systems:  Denies shortness of breath or chest pain  Denies n/v   Intake/Output Summary (Last 24 hours) at 08/04/2023 0829 Last data filed at 08/04/2023 0651 Gross per 24 hour  Intake 1619.36 ml  Output 1925 ml  Net -305.64 ml    Vitals:  Vitals:   08/04/23 0438 08/04/23 0742 08/04/23 0756 08/04/23 0815  BP:  135/88 123/88 127/85  Pulse:  92 87 80  Resp:  19 (!) 22 (!) 28  Temp:  99.1 F (37.3 C) 98.9 F (37.2 C)   TempSrc:  Oral    SpO2:  100% 99% 97%  Weight: 88.2 kg  87.9 kg   Height:         Physical Exam:  General adult male in bed in no acute distress HEENT normocephalic atraumatic extraocular movements intact sclera anicteric Neck supple trachea midline Lungs clear to auscultation bilaterally normal work of breathing at rest  Heart S1S2 no rub Abdomen soft nontender nondistended Extremities no edema  Psych normal mood and affect Neuro - alert and oriented x 3 provides hx and follows commands Access left chest nontunneled dialysis catheter   Medications reviewed   Labs:     Latest Ref Rng & Units 08/04/2023    4:25 AM 08/03/2023    4:01 AM 08/02/2023    8:17 AM  BMP  Glucose 70 - 99 mg/dL 95  91  213   BUN 6 - 20 mg/dL 086  94  83   Creatinine 0.61 - 1.24 mg/dL 57.84  6.96  2.95   Sodium 135 - 145 mmol/L 130  130  130   Potassium 3.5 - 5.1 mmol/L 5.5  5.5  5.1   Chloride 98 - 111 mmol/L 95  94  94   CO2 22 - 32 mmol/L 22  21  23    Calcium 8.9 - 10.3 mg/dL 7.9  8.2  8.0      Assessment/Plan:   Pt is a 38 y.o. yo male  likely ATN after cardiac arrest, cardiogenic shock on ECMO ( decan 3/8)  and Impella ( removed 3/12), AHRF/VDRF    # Dialysis dependent anuric AKI 2/2 ATN from cardiogenic shock on CRRT 07/11/23 - 07/26/23, Left subclavian temp HD cath placed by CCM; note interruptions because of procedures, imaging studies as well as clotting. Transitioned to [facility] on 3/17.   - Improving urine output - hopeful for recovery  - HD today for clearance    - Assess HD needs daily  - continue with temporary catheter for now - confirmed that IR was not yet consulted for tunneled   # Cardiogenic Shock,  Impella removed; status post transcutaneous mitral valve repair; previously on ECMO. per cardiology.   # VT + PEA SCA, as per cardiology  # PE - on heparin per primary team   # Normocytic Anemia -  on aranesp 200 mcg weekly on Fridays.  Stable   # Hyperkalemia - on HD.  Changed to renal diet   # Severe MR s/p TEER 3/5  Disposition continue inpatient monitoring.  Per discussion with CHF team plan  is for rehab likely   Estanislado Emms, MD 08/04/2023 8:50 AM

## 2023-08-04 NOTE — Progress Notes (Signed)
 PT Cancellation Note  Patient Details Name: Aaron [name] MRN: [id number] DOB: [birth year]   Cancelled Treatment:    Reason Eval/Treat Not Completed: (P) Other (comment) Pt just returned from HD and is awaiting SLP for MBS prior to transfer to AIR. PT will follow back tomorrow if he is still here.    Elon Alas [facility] 08/04/2023, 12:38 PM

## 2023-08-04 NOTE — TOC Transition Note (Signed)
 Transition of Care [facility]) - Discharge Note   Patient Details  Name: Aaron [name] MRN: [id number] Date of Birth: 02/20/1986  Transition of Care [facility]) CM/SW Contact:  Elliot Cousin, RN Phone Number: [phone] 08/04/2023, 2:01 PM   Clinical Narrative:     TOC CM received notification from CIR for plan dc to IP rehab today.   Final next level of care: IP Rehab Facility Barriers to Discharge: No Barriers Identified   Patient Goals and CMS Choice Patient states their goals for this hospitalization and ongoing recovery are:: wants to recover CMS Medicare.gov Compare Post Acute Care list provided to:: Patient Choice offered to / list presented to : Patient      Discharge Placement                       Discharge Plan and Services Additional resources added to the After Visit Summary for     Discharge Planning Services: CM Consult Post Acute Care Choice: IP Rehab                               Social Drivers of Health (SDOH) Interventions SDOH Screenings   Food Insecurity: No Food Insecurity (07/10/2023)  Housing: Low Risk  (07/10/2023)  Transportation Needs: No Transportation Needs (07/10/2023)  Utilities: Not At Risk (07/10/2023)  Financial Resource Strain: Medium Risk (04/01/2023)  Tobacco Use: Low Risk  (07/10/2023)  Health Literacy: Adequate Health Literacy (04/01/2023)     Readmission Risk Interventions    07/10/2023   10:12 AM  Readmission Risk Prevention Plan  Transportation Screening Complete  PCP or Specialist Appt within 5-7 Days Complete  Home Care Screening Complete  Medication Review (RN CM) Complete

## 2023-08-04 NOTE — Progress Notes (Addendum)
 Patient ID: Aaron [name], male   DOB: Jan 06, 1986, 38 y.o.   MRN: [id number]     Advanced Heart Failure Rounding Note  Cardiologist: Christell Constant, MD  Chief Complaint: V-A ECMO  Subjective:   - Admitted 2/26 with worsening SOB, CP and a fib with RVR. - Decompensated 2/27 with IVF, nodal blockade with subsequent respiratory arrest, ROSC achieved after ~16 minute down time - Femoral VA ecmo cannulation 2/27 with Impella CP vent - ECMO decannulation 3/8 - Impella removed and extubated 3/12.  Had to be reintubated with mucus plugging and left lung opacification.  - MRI head 3/13 with small infarcts right cerebral and cerebellar hemispheres - Re-extubated 3/16  Currently in iHD. -2.3L UOP. SCr 10.47 today  Feels good this morning.   Objective:   Echo 08/01/23 EF 40-45% mild residual MR. RV ok   Weight Range: 87.9 kg Body mass index is 23.59 kg/m.   Vital Signs:   Temp:  [98 F (36.7 C)-99.1 F (37.3 C)] 98.9 F (37.2 C) (03/24 0756) Pulse Rate:  [75-92] 80 (03/24 0815) Resp:  [16-28] 28 (03/24 0815) BP: (123-142)/(77-94) 127/85 (03/24 0815) SpO2:  [96 %-100 %] 97 % (03/24 0815) Weight:  [87.9 kg-88.2 kg] 87.9 kg (03/24 0756) Last BM Date : 08/03/23  Weight change: Filed Weights   08/02/23 0432 08/04/23 0438 08/04/23 0756  Weight: 77.1 kg 88.2 kg 87.9 kg    Intake/Output:   Intake/Output Summary (Last 24 hours) at 08/04/2023 0822 Last data filed at 08/04/2023 0651 Gross per 24 hour  Intake 1619.36 ml  Output 2175 ml  Net -555.64 ml    Physical Exam   General:  well appearing.  No respiratory difficulty Neck: supple. JVD ~6 cm. [facility] HD cath Cor: PMI nondisplaced. Regular rate & rhythm. No rubs, gallops or murmurs. Lungs: clear Abdomen: soft, nontender, nondistended. Good bowel sounds. Extremities: no cyanosis, clubbing, rash, edema  Neuro: alert & oriented x 3. Moves all 4 extremities w/o difficulty. Affect pleasant.   Telemetry   NSR 80s Personally  reviewed  Labs    CBC Recent Labs    08/03/23 0401 08/04/23 0425  WBC 8.5 7.3  HGB 9.4* 9.2*  HCT 28.7* 28.2*  MCV 92.9 92.2  PLT 421* 407*   Basic Metabolic Panel Recent Labs    40/98/11 0401 08/04/23 0425  NA 130* 130*  K 5.5* 5.5*  CL 94* 95*  CO2 21* 22  GLUCOSE 91 95  BUN 94* 104*  CREATININE 9.15* 10.47*  CALCIUM 8.2* 7.9*  MG 2.2  --    Liver Function Tests Recent Labs    08/03/23 0401  AST 39  ALT 65*  ALKPHOS 296*  BILITOT 0.9  PROT 6.6  ALBUMIN 2.5*   BNP: BNP (last 3 results) Recent Labs    07/09/23 1934 07/10/23 1224  BNP 703.7* 980.5*   Thyroid Function Tests No results for input(s): "TSH", "T4TOTAL", "T3FREE", "THYROIDAB" in the last 72 hours.  Invalid input(s): "FREET3"  Medications:   Scheduled Medications:  amiodarone  200 mg Oral Daily   arformoterol  15 mcg Nebulization BID   ascorbic acid  250 mg Oral BID   aspirin  81 mg Oral Daily   budesonide (PULMICORT) nebulizer solution  0.25 mg Nebulization BID   Chlorhexidine Gluconate Cloth  6 each Topical Q0600   darbepoetin (ARANESP) injection - DIALYSIS  200 mcg Subcutaneous Q Fri-1800   folic acid  1 mg Oral Daily   insulin aspart  0-20 Units Subcutaneous  TID WC   methimazole  10 mg Oral Daily   multivitamin  1 tablet Oral QHS   revefenacin  175 mcg Nebulization Daily   sodium chloride flush  3 mL Intravenous Q12H   thiamine  100 mg Oral Daily   Or   thiamine  100 mg Intravenous Daily   zinc sulfate (50mg  elemental zinc)  220 mg Oral Daily    Infusions:  heparin 1,900 Units/hr (08/04/23 0806)    PRN Medications: acetaminophen, chlorpheniramine-HYDROcodone, Gerhardt's butt cream, HYDROmorphone (DILAUDID) injection, labetalol, lactulose, [COMPLETED] loperamide HCl **FOLLOWED BY** loperamide HCl, ondansetron (ZOFRAN) IV, oxyCODONE, polyethylene glycol Patient Profile   Patient with a history of Grave's disease, severe primary mitral regurgitation who presented with chest  pain and segmental PE. Subsequent progressed to SCAI Stage E shock requiring [place] ECMO cannulation on 2/27.   Assessment/Plan  SCAI Stage E Cardiogenic shock - Suspect hypoxic respiratory failure driven with segmental PE on top of severe MR, nodal blockage, and thyrotoxicosis - Down time ~ 20 minutes, good mental status confirmed post code - Underwent successful mTEER on 07/16/23 with placement of x3 clips and improvement in MR from severe to mild. Mean mitral gradient of 3-55mmHg.  - Decannulated from V-A ECMO by Dr. Karin Lieu on 07/19/23 with vascular cut down and primary repair of left femoral artery.  - Echo 3/10: EF 35-40%, anterior and septal HK, normal RV, s/p Mitraclip with mild MR and mean gradient 7 mmHg.  - Impella removed 3/12.  - Echo 08/01/23 EF 40-45% mild residual MR. RV ok  - Resolved. Doing well today. GDMT limited by AKI/CKD   Severe mitral valve regurgitation - Noted on previous echocardiogram - Now s/p mTEER on 07/16/23; improvement in MR from severe to mild.  - Echo with mild residual MR  Pulmonary hypertension - Due to severe MR - Now much improved.   VT/NSVT - Initially with frequent runs of NSVT likely secondary to impella 5.5 position. Impella now out.  - no further VT - Now off mexilitene - continue amiodarone 200 mg daily for now. Stop prior to d/c   Acute renal failure/hyperkalemia - Nephrology following. Now on iHD.  - Starting to make urine. -2350 + unmeasured occurrence yesterday. Still not clearing though. - TDC on hold with some renal recover - Plan to transition to Eliquis when long term HD access in place if needed  Thyrotoxicosis - Was not taking medications as they made him feel poorly - Suspect underlying low output heart failure due to mitral valve disease - On methimazole 10 tid.   Atrial fibrillation - Present on arrival, in the setting of PE and thyroid disease - maintaining NSR  - Continue po amio for now  - heparin gtt. Eliquis after TDC  placed  Pulmonary embolism:  - Segmental without right heart strain - Heparin gtt.  Eliquis after TDC placed  CAD - Prior embolic infarct in the LAD territory with corresponding LGE on CMR - Lifelong anticoagulation - On heparin gtt   CVA - MRI head 3/13 with small infarcts right cerebral and cerebellar hemispheres. This should not significantly affect consciousness.  - Suspect embolic, on heparin gtt.   Hyperkalemia - 5.5 today - getting dialyzed.  - Repeat BMET later today  Continues to improve. Stable for d/c to CIR today from AHF standpoint.   Length of Stay: 26  Alen Bleacher, NP  08/04/2023, 8:22 AM  Advanced Heart Failure Team Pager [phone] (M-F; 7a - 5p)  Please contact [facility] for  night-coverage after hours (5p -7a ) and weekends on [email or web address]  Patient seen and examined with the above-signed Advanced Practice Provider and/or Housestaff. I personally reviewed laboratory data, imaging studies and relevant notes. I independently examined the patient and formulated the important aspects of the plan. I have edited the note to reflect any of my changes or salient points. I have personally discussed the plan with the patient and/or family.  Patient seen in HD. Doing well. No CP or SOB.   Urine output much improved but still not clearing adequately. Remains In NSR  General:  Sitting up in bedNo resp difficulty HEENT: normal Neck: supple. no JVD. Carotids 2+ bilat; no bruits. No lymphadenopathy or thryomegaly appreciated. Cor: RRR 2/6 TR L SCV HD cath  Lungs: clear Abdomen: soft, nontender, nondistended. No hepatosplenomegaly. No bruits or masses. Good bowel sounds. Extremities: no cyanosis, clubbing, rash, edema Neuro: alert & orientedx3, cranial nerves grossly intact. moves all 4 extremities w/o difficulty. Affect pleasant  Doing very well from cardiac perspective. Renal function seems to be recovering. Likely can hold off on [facility]. (D/w Dr. Malen Gauze). Continue  heparin for now but once final decision made on [facility] can switch heparin to Eliquis. Ok for [employer].   Arvilla Meres, MD  9:15 AM

## 2023-08-04 NOTE — Progress Notes (Signed)
 SLP Cancellation Note  Patient Details Name: Aaron [name] MRN: [id number] DOB: Jun 19, 1985   Cancelled treatment:       Reason Eval/Treat Not Completed: Patient at procedure or test/unavailable (HD). SLP will f/u to attempt MBS as scheduling allows.   Gwynneth Aliment, M.A., CF-SLP Speech Language Pathology, Acute Rehabilitation Services  Secure Chat preferred [phone]  08/04/2023, 9:03 AM

## 2023-08-04 NOTE — Progress Notes (Signed)
 PHARMACY - ANTICOAGULATION CONSULT NOTE  Pharmacy Consult for Heparin  Indication: pulmonary embolus, s/p ECMO decannulation 3/7, Impella removal 3/12  No Known Allergies  Patient Measurements: Height: 6\' 4"  (193 cm) Weight: 88.6 kg (195 lb 5.2 oz) IBW/kg (Calculated) : 86.8 Heparin Dosing Weight: n/a  Vital Signs: Temp: 98.5 F (36.9 C) (03/24 1625) Temp Source: Oral (03/24 1625) BP: 133/80 (03/24 1625) Pulse Rate: 88 (03/24 1625)  Labs: Recent Labs    08/02/23 0422 08/02/23 0817 08/02/23 0817 08/03/23 0401 08/04/23 0425  HGB  --  8.8*   < > 9.4* 9.2*  HCT  --  27.3*  --  28.7* 28.2*  PLT  --  398  --  421* 407*  HEPARINUNFRC 0.34  --   --  0.45 0.47  CREATININE  --  7.70*  --  9.15* 10.47*   < > = values in this interval not displayed.    Estimated Creatinine Clearance: 11.7 mL/min (A) (by C-G formula based on SCr of 10.47 mg/dL (H)).   Medical History: Past Medical History:  Diagnosis Date   Atrial fibrillation [facility])    Hyperthyroidism    Mitral regurgitation    NSTEMI (non-ST elevated myocardial infarction) [facility])     Assessment: 38 yo M with bilateral PE s/p TNK (2/27 @1156 ) now on [place] ECMO + Impella. Pharmacy consulted for bivalirudin for anticoagulation.   S/p impella CP >5.5 3/3 - bivalirudin was previously held with oozing at insertion site but restart 3/4. Patient underwent mitraclip 3/5.  Bival transitioned to heparin 3/7 after decannulation.  Impella 5.5 removed 3/13. Heparin restarted 3/13. Hep in CRRT stopped 3/15 with CRRT stopping.   3/24 AM: Heparin level 0.47, therapeutic on 1900 units/hr. No issues with infusion running or signs of bleeding noted per RN. Hgb 9.2, pltc 407 (stable).  Deferring TC placement today pending possible renal recovery - continuing heparin, eventually transition to Eliquis pending final decision.   Goal of Therapy:  Heparin level 0.3-0.7 units/mL Monitor platelets by anticoagulation protocol: Yes   Plan:  Continue  heparin infusion at 1900 units/hr Heparin level and CBC daily  Monitor s/sx of bleeding   Trixie Rude, PharmD Clinical Pharmacist 08/04/2023  12:04 PM    Addendum:  Patient transferred to inpatient rehab.  Will reorder heparin and associated labs per inpatient orders.  Follow-up plans for oral anticoagulation.  [employer], Pharm.D., BCPS Clinical Pharmacist  **Pharmacist phone directory can be found on [email or web address] listed under [facility].  08/04/2023 5:11 PM

## 2023-08-04 NOTE — H&P (Signed)
 Physical Medicine and Rehabilitation Admission H&P        Chief Complaint  Patient presents with   Chest Pain  : HPI: Aaron [name]. Oesterling is a 38 year old right-handed male with history significant for PAF not adherent to Eliquis, chronic diastolic congestive heart failure, severe mitral regurgitation, hyperthyroidism.  Per chart review patient lives with girlfriend.  1 level apartment.  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  Presented 07/09/2023 with left side chest pain radiating to his back and dyspnea x 4 days.  Noted increased pain with inspiration.  He did report some radiating pain down his left arm and numbness with tingling sensation.  Noted blood pressure 138/108 and oxygen saturations 100%.  CT angiogram of the chest showed segmental bilateral lower lobe pulmonary emboli.  No evidence for right heart strain.  Patchy groundglass opacities in the inferior left lower lobe worrisome for infarct.  New mediastinal and bilateral hilar lymphadenopathy.  Admission chemistries unremarkable except CO2 of 20, troponin 64-56.,  BNP 703.7, D-dimer 1.83, TSH less than 0.0100 and free T4 2.15.  Intravenous heparin was initiated.  Hospital course complicated by PEA arrest/A-fib with RVR and ROSC after 40 minutes status post TNK/ECMO and followed by heart failure team.  Cardiac rate remains controlled maintained on amiodarone 200 mg daily.  Latest echocardiogram showed ejection fraction of 40 to 45% left ventricle showing mildly decreased function.  Postarrest course complicated by ATN requiring CRRT transition to hemodialysis.  He was decannulated 3/8 also requiring Impella eventually removed 3/12.  He initially failed extubation x 1 due to mucous plugging but eventually was extubated 3/16.  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  Patient currently remains on intravenous heparin for pulmonary emboli plan is to switch to Eliquis after [facility] HD catheter placed.  He has had some  elevated LFTs/transaminitis likely secondary to shock liver and monitored.  Currently on a regular consistency die with Cortrak removed 3/22t with swallow study pending results 08/04/2023.  Palliative care continue to follow to establish goals of care.  Therapy evaluations completed due to patient decreased functional mobility/debility was admitted for a comprehensive rehab program.   Review of Systems  Constitutional:  Negative for chills and fever.  HENT:  Negative for hearing loss.   Eyes:  Negative for blurred vision and double vision.  Respiratory:  Positive for shortness of breath. Negative for wheezing.   Cardiovascular:  Positive for chest pain and leg swelling.  Gastrointestinal:  Positive for constipation. Negative for heartburn, nausea and vomiting.  Genitourinary:  Negative for dysuria, flank pain and hematuria.  Skin:  Negative for rash.  Neurological:  Positive for weakness.  All other systems reviewed and are negative.       Past Medical History:  Diagnosis Date   Atrial fibrillation [facility])     Hyperthyroidism     Mitral regurgitation     NSTEMI (non-ST elevated myocardial infarction) [facility]               Past Surgical History:  Procedure Laterality Date   ECMO CANNULATION N/A 07/10/2023    Procedure: ECMO CANNULATION;  Surgeon: Dolores Patty, MD;  [facility] INVASIVE CV LAB;  Service: Cardiovascular;  Laterality: N/A;   EMBOLECTOMY Left 07/18/2023    Procedure: EMBOLECTOMY Left Femoral, Popliteal and iliac arterys,  Repair Left common femoral artery.;  Surgeon: Victorino Sparrow, MD;  Location: [facility] OR;  Service: Vascular;  Laterality: Left;   INTRAOPERATIVE TRANSESOPHAGEAL ECHOCARDIOGRAM N/A 07/23/2023  Procedure: ECHOCARDIOGRAM, TRANSESOPHAGEAL, INTRAOPERATIVE;  Surgeon: Loreli Slot, MD;  Location: [facility] OR;  Service: Open Heart Surgery;  Laterality: N/A;   LEFT HEART CATH AND CORONARY ANGIOGRAPHY N/A 10/22/2021    Procedure: LEFT HEART CATH AND CORONARY  ANGIOGRAPHY;  Surgeon: Yvonne Kendall, MD;  Location: [facility] INVASIVE CV LAB;  Service: Cardiovascular;  Laterality: N/A;   PLACEMENT OF IMPELLA LEFT VENTRICULAR ASSIST DEVICE Right 07/14/2023    Procedure: PLACEMENT OF RIGHT AXILLARY IMPELLA 5.5 LEFT VENTRICULAR ASSIST DEVICE;  Surgeon: Loreli Slot, MD;  Location: [facility] OR;  Service: Open Heart Surgery;  Laterality: Right;   REMOVAL OF IMPELLA LEFT VENTRICULAR ASSIST DEVICE Right 07/14/2023    Procedure: REMOVAL OF CP RIGHT FEMORAL IMPELLA LEFT VENTRICULAR ASSIST DEVICE;  Surgeon: Loreli Slot, MD;  Location: [facility] OR;  Service: Open Heart Surgery;  Laterality: Right;   REMOVAL OF IMPELLA LEFT VENTRICULAR ASSIST DEVICE N/A 07/23/2023    Procedure: REMOVAL, CARDIAC ASSIST DEVICE, IMPELLA;  Surgeon: Loreli Slot, MD;  Location: [facility] OR;  Service: Open Heart Surgery;  Laterality: N/A;   TRANSCATHETER MITRAL EDGE TO EDGE REPAIR N/A 07/16/2023    Procedure: TRANSCATHETER MITRAL EDGE TO EDGE REPAIR;  Surgeon: Tonny Bollman, MD;  Location: [facility] INVASIVE CV LAB;  Service: Cardiovascular;  Laterality: N/A;   TRANSESOPHAGEAL ECHOCARDIOGRAM (CATH LAB) N/A 07/16/2023    Procedure: TRANSESOPHAGEAL ECHOCARDIOGRAM;  Surgeon: Tonny Bollman, MD;  Location: [facility] INVASIVE CV LAB;  Service: Cardiovascular;  Laterality: N/A;   VENTRICULAR ASSIST DEVICE INSERTION N/A 07/10/2023    Procedure: VENTRICULAR ASSIST DEVICE INSERTION;  Surgeon: Dolores Patty, MD;  [facility]AS[facility] CV [facility];  Service: Cardiovascular;  Laterality: N/A;             Family History  Problem Relation Age of Onset   Heart disease Other          Social History:  reports that he has never smoked. He has never used smokeless tobacco. He reports current drug use. Frequency: 3.00 times per week. Drug: Marijuana. He reports that he does not drink alcohol. Allergies:  Allergies  No Known Allergies         Medications Prior to Admission  Medication Sig Dispense Refill   bismuth  subsalicylate (PEPTO BISMOL) 262 MG chewable tablet Chew 524 mg by mouth as needed for indigestion.       apixaban (ELIQUIS) 5 MG TABS tablet Take 1 tablet (5 mg total) by mouth 2 (two) times daily. (Patient not taking: Reported on 07/09/2023) 180 tablet 3   methimazole (TAPAZOLE) 10 MG tablet Take 1 tablet (10 mg total) by mouth every 8 (eight) hours. 90 tablet 2   metoprolol tartrate 37.5 MG TABS Take 1 tablet (37.5 mg total) by mouth 2 (two) times daily. 60 tablet 2   spironolactone (ALDACTONE) 25 MG tablet Take 1 tablet (25 mg total) by mouth daily. (Patient not taking: Reported on 07/09/2023) 90 tablet 3              Home: Home Living Family/patient expects to be discharged to:: Private residence Living Arrangements: Spouse/significant other Available Help at Discharge: Family, Available 24 hours/day Type of Home: House Home Access: Level entry Home Layout: One level Bathroom Shower/Tub: Engineer, manufacturing systems: Standard Home Equipment: None  Lives With: Significant other   Functional History: Prior Function Prior Level of Function : Independent/Modified Independent, Working/employed, Driving Mobility Comments: works as a Paediatric nurse ADLs Comments: Indep   Functional Status:  Mobility: Bed Mobility Overal bed mobility: Needs  Assistance Bed Mobility: Supine to Sit Rolling: Used rails, Min assist Supine to sit: HOB elevated, Used rails, Mod assist Sit to supine: Mod assist, +2 for physical assistance General bed mobility comments: quick movements of LE out of bed. ModA to raise trunk Transfers Overall transfer level: Needs assistance Equipment used: Rolling walker (2 wheels) Transfers: Sit to/from Stand Sit to Stand: Min assist Bed to/from chair/wheelchair/BSC transfer type:: Step pivot Step pivot transfers: Min assist, +2 physical assistance, +2 safety/equipment  Lateral/Scoot Transfers: Mod assist, +2 physical assistance General transfer comment: MinA to boost up  and steady with use of RW, cues for hand placement Ambulation/Gait Ambulation/Gait assistance: Min assist Gait Distance (Feet): 16 Feet Assistive device: Rolling walker (2 wheels) Gait Pattern/deviations: Step-through pattern, Decreased step length - right, Decreased step length - left, Drifts right/left General Gait Details: pt takes short and choppy steps with MinA required to steady. Drifts L/R with cues for how to navigate RW and when to turn Gait velocity: decr   ADL: ADL Overall ADL's : Needs assistance/impaired Eating/Feeding: NPO Grooming: Minimal assistance, Moderate assistance, Sitting, Wash/dry face Grooming Details (indicate cue type and reason): Min A for washing face. support provided under elbow due to pt difficulty lifting to face. pt then brought face down to where hand/forearm propped on LE for washing eyes/forehead. will need increased assist for multi step ADLs Upper Body Bathing: Sitting, Moderate assistance Lower Body Bathing: Total assistance, Sitting/lateral leans, Bed level Upper Body Dressing : Moderate assistance, Sitting Lower Body Dressing: Moderate assistance, Sitting/lateral leans, Sit to/from stand Lower Body Dressing Details (indicate cue type and reason): pt able to reach down to B feet to pull up socks EOB without LOB today. will need assist to completely don socks or manage other clothing Toilet Transfer: Minimal assistance, +2 for physical assistance, +2 for safety/equipment, Stand-pivot, Cueing for sequencing, BSC/3in1 Toilet Transfer Details (indicate cue type and reason): Min A x 2 to pivot to [facility] from recliner. improved to Min A x 1 for transfer back to recliner after using [facility] Toileting- Clothing Manipulation and Hygiene: Total assistance, Sit to/from stand Toileting - Clothing Manipulation Details (indicate cue type and reason): Total A in standing for hygiene. OT assisting with balance while nursing assisting with hygiene    Cognition: Cognition Overall Cognitive Status: Impaired/Different from baseline Arousal/Alertness: Awake/alert Orientation Level: Oriented X4 Attention: Sustained Sustained Attention: Appears intact Memory: Impaired Memory Impairment: Retrieval deficit, Decreased recall of new information Awareness: Impaired Awareness Impairment: Intellectual impairment Problem Solving: Impaired Problem Solving Impairment: Verbal basic Cognition Arousal: Alert Behavior During Therapy: Flat affect, WFL for tasks assessed/performed Overall Cognitive Status: Impaired/Different from baseline   Physical Exam: Blood pressure 117/87, pulse 74, temperature 98.9 F (37.2 C), resp. rate 20, height 6\' 4"  (1.93 m), weight 87.9 kg, SpO2 99%. Physical Exam Neurological:     Comments: Patient is alert.  Follows simple commands.  Oriented to person and place.  He did have some difficulty recalling his hospital course.    General: No acute distress Mood and affect are appropriate Heart: Regular rate and rhythm no rubs murmurs or extra sounds Lungs: Clear to auscultation, breathing unlabored, no rales or wheezes Abdomen: Positive bowel sounds, soft nontender to palpation, nondistended Extremities: No clubbing, cyanosis, or edema Skin: No evidence of breakdown, no evidence of rash, HD cath Neurologic: Cranial nerves II through XII intact, motor strength is 5/5 in bilateral deltoid, bicep, tricep, grip, 4/5 hip flexor, knee extensors, ankle dorsiflexor and plantar flexor Sensory exam normal sensation to  light touch in bilateral upper and lower extremities Cerebellar exam normal finger to nose to finger Musculoskeletal: Full range of motion in all 4 extremities. No joint swelling      Lab Results Last 48 Hours        Results for orders placed or performed during the hospital encounter of 07/09/23 (from the past 48 hours)  Glucose, capillary     Status: Abnormal    Collection Time: 08/02/23 11:40 AM  Result  Value Ref Range    Glucose-Capillary 129 (H) 70 - 99 mg/dL      Comment: Glucose reference range applies only to samples taken after fasting for at least 8 hours.  Glucose, capillary     Status: Abnormal    Collection Time: 08/02/23  6:24 PM  Result Value Ref Range    Glucose-Capillary 108 (H) 70 - 99 mg/dL      Comment: Glucose reference range applies only to samples taken after fasting for at least 8 hours.  Glucose, capillary     Status: Abnormal    Collection Time: 08/02/23  8:58 PM  Result Value Ref Range    Glucose-Capillary 113 (H) 70 - 99 mg/dL      Comment: Glucose reference range applies only to samples taken after fasting for at least 8 hours.  Heparin level (unfractionated)     Status: None    Collection Time: 08/03/23  4:01 AM  Result Value Ref Range    Heparin Unfractionated 0.45 0.30 - 0.70 IU/mL      Comment: (NOTE) The clinical reportable range upper limit is being lowered to >1.10 to align with the FDA approved guidance for the current laboratory assay.   If heparin results are below expected values, and patient dosage has  been confirmed, suggest follow up testing of antithrombin III levels. Performed at [facility], [address] [place], [place] [address]    CBC     Status: Abnormal    Collection Time: 08/03/23  4:01 AM  Result Value Ref Range    WBC 8.5 4.0 - 10.5 K/uL    RBC 3.09 (L) 4.22 - 5.81 MIL/uL    Hemoglobin 9.4 (L) 13.0 - 17.0 g/dL    HCT 19.1 (L) 47.8 - 52.0 %    MCV 92.9 80.0 - 100.0 fL    MCH 30.4 26.0 - 34.0 pg    MCHC 32.8 30.0 - 36.0 g/dL    RDW 29.5 (H) 62.1 - 15.5 %    Platelets 421 (H) 150 - 400 K/uL    nRBC 0.2 0.0 - 0.2 %      Comment: Performed at [facility], [address], [place], [place] [address]  Comprehensive metabolic panel     Status: Abnormal    Collection Time: 08/03/23  4:01 AM  Result Value Ref Range    Sodium 130 (L) 135 - 145 mmol/L    Potassium 5.5 (H) 3.5 - 5.1 mmol/L    Chloride 94 (L) 98 -  111 mmol/L    CO2 21 (L) 22 - 32 mmol/L    Glucose, Bld 91 70 - 99 mg/dL      Comment: Glucose reference range applies only to samples taken after fasting for at least 8 hours.    BUN 94 (H) 6 - 20 mg/dL    Creatinine, Ser 7.84 (H) 0.61 - 1.24 mg/dL    Calcium 8.2 (L) 8.9 - 10.3 mg/dL    Total Protein 6.6 6.5 - 8.1 g/dL  Albumin 2.5 (L) 3.5 - 5.0 g/dL    AST 39 15 - 41 U/L    ALT 65 (H) 0 - 44 U/L    Alkaline Phosphatase 296 (H) 38 - 126 U/L    Total Bilirubin 0.9 0.0 - 1.2 mg/dL    GFR, Estimated 7 (L) >60 mL/min      Comment: (NOTE) Calculated using the CKD-EPI Creatinine Equation (2021)      Anion gap 15 5 - 15      Comment: Performed at [facility], [address], [place], [place] [address]  Magnesium     Status: None    Collection Time: 08/03/23  4:01 AM  Result Value Ref Range    Magnesium 2.2 1.7 - 2.4 mg/dL      Comment: Performed at [facility], [address], [place], [place] [address]  Glucose, capillary     Status: Abnormal    Collection Time: 08/03/23  8:24 AM  Result Value Ref Range    Glucose-Capillary 108 (H) 70 - 99 mg/dL      Comment: Glucose reference range applies only to samples taken after fasting for at least 8 hours.  Glucose, capillary     Status: Abnormal    Collection Time: 08/03/23 11:53 AM  Result Value Ref Range    Glucose-Capillary 105 (H) 70 - 99 mg/dL      Comment: Glucose reference range applies only to samples taken after fasting for at least 8 hours.  Glucose, capillary     Status: None    Collection Time: 08/03/23  4:39 PM  Result Value Ref Range    Glucose-Capillary 89 70 - 99 mg/dL      Comment: Glucose reference range applies only to samples taken after fasting for at least 8 hours.  Glucose, capillary     Status: Abnormal    Collection Time: 08/03/23  9:30 PM  Result Value Ref Range    Glucose-Capillary 109 (H) 70 - 99 mg/dL      Comment: Glucose reference range applies only to samples taken after fasting for at  least 8 hours.  Heparin level (unfractionated)     Status: None    Collection Time: 08/04/23  4:25 AM  Result Value Ref Range    Heparin Unfractionated 0.47 0.30 - 0.70 IU/mL      Comment: (NOTE) The clinical reportable range upper limit is being lowered to >1.10 to align with the FDA approved guidance for the current laboratory assay.   If heparin results are below expected values, and patient dosage has  been confirmed, suggest follow up testing of antithrombin III levels. Performed at [facility], [address], [place], [place] [address]    CBC     Status: Abnormal    Collection Time: 08/04/23  4:25 AM  Result Value Ref Range    WBC 7.3 4.0 - 10.5 K/uL    RBC 3.06 (L) 4.22 - 5.81 MIL/uL    Hemoglobin 9.2 (L) 13.0 - 17.0 g/dL    HCT 91.4 (L) 78.2 - 52.0 %    MCV 92.2 80.0 - 100.0 fL    MCH 30.1 26.0 - 34.0 pg    MCHC 32.6 30.0 - 36.0 g/dL    RDW 95.6 (H) 21.3 - 15.5 %    Platelets 407 (H) 150 - 400 K/uL    nRBC 0.0 0.0 - 0.2 %      Comment: Performed at [facility], [address], [place], [place] [address]  Basic metabolic panel     Status: Abnormal    Collection Time: 08/04/23  4:25 AM  Result Value Ref Range    Sodium 130 (L) 135 - 145 mmol/L    Potassium 5.5 (H) 3.5 - 5.1 mmol/L    Chloride 95 (L) 98 - 111 mmol/L    CO2 22 22 - 32 mmol/L    Glucose, Bld 95 70 - 99 mg/dL      Comment: Glucose reference range applies only to samples taken after fasting for at least 8 hours.    BUN 104 (H) 6 - 20 mg/dL    Creatinine, Ser 91.47 (H) 0.61 - 1.24 mg/dL    Calcium 7.9 (L) 8.9 - 10.3 mg/dL    GFR, Estimated 6 (L) >60 mL/min      Comment: (NOTE) Calculated using the CKD-EPI Creatinine Equation (2021)      Anion gap 13 5 - 15      Comment: Performed at [facility] Lab, [address], [place], [place] [address]  Glucose, capillary     Status: None    Collection Time: 08/04/23  7:40 AM  Result Value Ref Range    Glucose-Capillary 88 70 - 99 mg/dL       Comment: Glucose reference range applies only to samples taken after fasting for at least 8 hours.      Imaging Results (Last 48 hours)  No results found.         Blood pressure 117/87, pulse 74, temperature 98.9 F (37.2 C), resp. rate 20, height 6\' 4"  (1.93 m), weight 87.9 kg, SpO2 99%.   Medical Problem List and Plan: 1. Functional deficits secondary to debility/pulmonary emboli located by small infarct right cerebral and cerebellar hemispheres/hypoxic brain injury             -patient may not shower             -ELOS/Goals: 18-21d 2.  Antithrombotics: -DVT/anticoagulation:  Pharmaceutical: Heparin transitioning to Eliquis after TDC HD catheter placed             -antiplatelet therapy: Aspirin 81 mg daily 3. Pain Management: Oxycodone as needed 4. Mood/Behavior/Sleep: Provide emotional support             -antipsychotic agents: N/A 5. Neuropsych/cognition: This patient is capable of making decisions on his own behalf. 6. Skin/Wound Care: Routine skin checks 7. Fluids/Electrolytes/Nutrition: Routine in and outs with follow-up chemistries 8.  Cardiogenic shock/acute systolic congestive heart failure/A-fib/severe MR/SVT/PEA arrest.  ROSC x 40 minutes.  Follow-up cardiology services status post ECMO with Impella, decannulated 3/8 and Impella removed and extubated 3/12-reintubated but eventually extubated again 3/16.  Currently on amiodarone 200 mg daily 9.  Acute kidney injury due to ATN/hyperkalemia.  Follow-up renal services.  Required CRRT transition to hemodialysis 10.  Normocytic anemia.  Follow-up CBC.  Continue Aranesp 11.  Transaminitis.  Likely shock liver.  Follow-up chemistries 12.  Decreased nutritional storage.  Dietary follow-up.  Follow-up MBS. 13.  Hypothyroidism.  Continue Tapazole 10 mg daily.  Need to check TSH in 4 weeks.     Mcarthur Rossetti Angiulli, PA-C 08/04/2023 "I have personally performed a face to face diagnostic evaluation of this patient.  Additionally, I have  reviewed and concur with the physician assistant's documentation above." Erick Colace M.D. [facility] Fellow Am [employer] of [employer] Med and Rehab Diplomate Am Board of Electrodiagnostic Med Fellow Am Board of Interventional Pain

## 2023-08-04 NOTE — Progress Notes (Addendum)
 Signed     Expand All Collapse All PMR Admission Coordinator Pre-Admission Assessment   Patient: Aaron [name] is an 38 y.o., male MRN: [id number] DOB: 1986-01-15 Height: 6\' 4"  (193 cm) Weight: 87.9 kg                                                                                                                                                  Insurance Information HMO:     PPO:      PCP:      IPA:      80/20:      OTHER:  PRIMARY: [place] [employer] [facility] - [facility]      Policy#: [id number]      Subscriber: patient CM Name: Christen Butter      Phone#: [phone]     Fax#: [phone] Pre-Cert#: [id number] (will change after determination rendered)   AUTH #: [id number]   Received approval on 08/01/23. Pt approved for 7 days beginning 08/01/23-08/08/23. Employer: Works sometimes as a Insurance account manager:  Phone #: [phone]     Name:  Eff. Date: 04/13/23     Deduct: $0      Out of Pocket Max: $0      Life Max: n/a  CIR: 100%      SNF: 100% Outpatient:       Co-Pay: $4/visit Home Health: 100%      Co-Pay: none DME: 100%     Co-Pay: none Providers: in network [facility] provider ID is [phone]  SECONDARY:       Policy#:       Phone#:    Artist:       Phone#:    The Engineer, materials Information Summary" for patients in Inpatient Rehabilitation Facilities with attached "Privacy Act Statement-Health Care Records" was provided and verbally reviewed with: Patient   Emergency Contact Information Contact Information       Name Relation Home Work Mobile    [place] Sister [phone]        Trice,[name] Significant other     [phone]         Other Contacts   None on File      Current Medical History  Patient Admitting Diagnosis: R CVA, cardiogenic shock, cardiac arrest   History of Present Illness: A 38 y.o. male who presented to the ED At [facility] initially on 07/09/23 for evaluation of chest pain and dyspnea. Was later transferred to [facility].  CTA revealed  semental B lower lobe PE. On 07/10/23 had VT/VF arrest, requiring of CPR and multiple shocks. Placed on [facility] ECMO with impella CP. Impella removed 07/23/23, extubated 3/16. acute Right hemisphere strokes on MRI 3/13.   PMH: PAF not adherent to Eliquis, chronic HFpEF, severe mitral regurgitation, hyperthyroidism  Glasgow Coma Scale Score: 15   Patient's medical record from [facility] has  been reviewed by the rehabilitation admission coordinator and physician.   Past Medical History      Past Medical History:  Diagnosis Date   Atrial fibrillation (HCC)     Hyperthyroidism     Mitral regurgitation     NSTEMI (non-ST elevated myocardial infarction) [facility])            Has the patient had major surgery during 100 days prior to admission? Yes   Family History  family history includes Heart disease in an other family member.     Current Medications   Current Medications    Current Facility-Administered Medications:    acetaminophen (TYLENOL) tablet 650 mg, 650 mg, Oral, Q6H PRN, Marinda Elk, MD, 650 mg at 07/31/23 1955   amiodarone (PACERONE) tablet 200 mg, 200 mg, Oral, Daily, Brynda Peon L, NP, 200 mg at 08/04/23 1209   arformoterol (BROVANA) nebulizer solution 15 mcg, 15 mcg, Nebulization, BID, Lorin Glass, MD, 15 mcg at 08/03/23 2003   ascorbic acid (VITAMIN C) tablet 250 mg, 250 mg, Oral, BID, Lorin Glass, MD, 250 mg at 08/04/23 1209   aspirin chewable tablet 81 mg, 81 mg, Oral, Daily, Lorin Glass, MD, 81 mg at 08/04/23 1209   budesonide (PULMICORT) nebulizer solution 0.25 mg, 0.25 mg, Nebulization, BID, Lorin Glass, MD, 0.25 mg at 08/03/23 2003   Chlorhexidine Gluconate Cloth 2 % PADS 6 each, 6 each, Topical, Q0600, Annie Sable, MD, 6 each at 08/04/23 0435   chlorpheniramine-HYDROcodone (TUSSIONEX) 10-8 MG/5ML suspension 5 mL, 5 mL, Oral, QHS PRN, Lorin Glass, MD   Darbepoetin Alfa (ARANESP) injection 200 mcg, 200 mcg, Subcutaneous, Q  Fri-1800, Annie Sable, MD, 200 mcg at 08/01/23 1820   folic acid (FOLVITE) tablet 1 mg, 1 mg, Oral, Daily, Lorin Glass, MD, 1 mg at 08/04/23 1209   Gerhardt's butt cream, , Topical, Daily PRN, Marinda Elk, MD   heparin ADULT infusion 100 units/mL (25000 units/27mL), 1,900 Units/hr, Intravenous, Continuous, Arabella Merles, [facility], Last Rate: 19 mL/hr at 08/04/23 0806, 1,900 Units/hr at 08/04/23 0806   HYDROmorphone (DILAUDID) injection 1 mg, 1 mg, Intravenous, Q4H PRN, Selmer Dominion B, NP   insulin aspart (novoLOG) injection 0-20 Units, 0-20 Units, Subcutaneous, TID WC, Gherghe, Daylene Katayama, MD   labetalol (NORMODYNE) injection 10 mg, 10 mg, Intravenous, Q2H PRN, Paliwal, Aditya, MD, 10 mg at 07/26/23 0816   lactulose (CHRONULAC) 10 GM/15ML solution 30 g, 30 g, Oral, BID PRN, Leatha Gilding, MD   [COMPLETED] loperamide HCl (IMODIUM) 1 MG/7.5ML suspension 4 mg, 4 mg, Per Tube, Once, 4 mg at 07/28/23 1341 **FOLLOWED BY** loperamide HCl (IMODIUM) 1 MG/7.5ML suspension 2 mg, 2 mg, Oral, BID PRN, Leatha Gilding, MD, 2 mg at 07/31/23 1957   methimazole (TAPAZOLE) tablet 10 mg, 10 mg, Oral, Daily, Lorin Glass, MD, 10 mg at 08/04/23 1209   multivitamin (RENA-VIT) tablet 1 tablet, 1 tablet, Oral, QHS, Lorin Glass, MD, 1 tablet at 08/03/23 2134   ondansetron [facility]) injection 4 mg, 4 mg, Intravenous, Q6H PRN, Lorin Glass, MD, 4 mg at 08/04/23 1213   oxyCODONE (Oxy IR/ROXICODONE) immediate release tablet 5 mg, 5 mg, Per Tube, Q6H PRN, Lorin Glass, MD   polyethylene glycol (MIRALAX / GLYCOLAX) packet 17 g, 17 g, Oral, Daily PRN, Elvera Lennox, Daylene Katayama, MD   revefenacin (YUPELRI) nebulizer solution 175 mcg, 175 mcg, Nebulization, Daily, Lorin Glass, MD, 175 mcg at 08/03/23 0755   sodium chloride flush (NS) 0.9 %  injection 3 mL, 3 mL, Intravenous, Q12H, Gold, Wayne E, PA-C, 3 mL at 08/04/23 1221   thiamine (VITAMIN B1) tablet 100 mg, 100 mg, Oral, Daily, 100 mg at 08/04/23  1208 **OR** thiamine (VITAMIN B1) injection 100 mg, 100 mg, Intravenous, Daily, Gherghe, Costin M, MD   zinc sulfate (50mg  elemental zinc) capsule 220 mg, 220 mg, Oral, Daily, Lorin Glass, MD, 220 mg at 08/04/23 1209     Patients Current Diet:  Diet Order                  Diet renal with fluid restriction Fluid restriction: 1500 mL Fluid; Room service appropriate? Yes; Fluid consistency: Thin  Diet effective now                         Precautions / Restrictions Precautions Precautions: Fall Precaution/Restrictions Comments: cortrak Restrictions Weight Bearing Restrictions Per Provider Order: No RLE Weight Bearing Per Provider Order: Non weight bearing LLE Weight Bearing Per Provider Order: Non weight bearing    Has the patient had 2 or more falls or a fall with injury in the past year?No   Prior Activity Level Limited Community (1-2x/wk): Went out 3 times a week, was driving, worked sometimes as a Adult nurse Level Prior Function Prior Level of Function : Independent/Modified Independent, Working/employed, 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  Independent   Indoor Mobility: Did the patient need assistance with walking from room to room (with or without device)? Independent   Stairs: Did the patient need assistance with internal or external stairs (with or without device)? Independent   Functional Cognition: Did the patient need help planning regular tasks such as shopping or remembering to take medications? Independent   Patient Information Are you of Hispanic, Latino/a,or Spanish origin?: A. No, not of Hispanic, Latino/a, or Spanish origin What is your race?: B. Black or African American Do you need or want an interpreter to communicate with a doctor or health care staff?: 0. No   Patient's Response To:  Health Literacy and  Transportation Is the patient able to respond to health literacy and transportation needs?: Yes Health Literacy - How often do you need to have someone help you when you read instructions, pamphlets, or other written material from your doctor or pharmacy?: Never In the past 12 months, has lack of transportation kept you from medical appointments or from getting medications?: No In the past 12 months, has lack of transportation kept you from meetings, work, or from getting things needed for daily living?: No   Home Assistive Devices / Equipment Home Equipment: None   Prior Device Use: Indicate devices/aids used by the patient prior to current illness, exacerbation or injury? None of the above   Current Functional Level Cognition   Arousal/Alertness: Awake/alert Overall Cognitive Status: Impaired/Different from baseline Orientation Level: Oriented X4 Attention: Sustained Sustained Attention: Appears intact Memory: Impaired Memory Impairment: Retrieval deficit, Decreased recall of new information Awareness: Impaired Awareness Impairment: Intellectual impairment Problem Solving: Impaired Problem Solving Impairment: Verbal basic    Extremity Assessment (includes Sensation/Coordination)   Upper Extremity Assessment: Generalized weakness, Right hand dominant RUE Deficits / Details: improving edema and hand strength. able to use xbox controller during session RUE Sensation: decreased light touch RUE Coordination: decreased fine motor, decreased gross motor LUE Deficits / Details: edema noted and impaired  coordination. profound weakness LUE Sensation: decreased light touch LUE Coordination: decreased fine motor, decreased gross motor  Lower Extremity Assessment: Defer to PT evaluation     ADLs   Overall ADL's : Needs assistance/impaired Eating/Feeding: NPO Grooming: Minimal assistance, Moderate assistance, Sitting, Wash/dry face Grooming Details (indicate cue type and reason): Min A  for washing face. support provided under elbow due to pt difficulty lifting to face. pt then brought face down to where hand/forearm propped on LE for washing eyes/forehead. will need increased assist for multi step ADLs Upper Body Bathing: Sitting, Moderate assistance Lower Body Bathing: Total assistance, Sitting/lateral leans, Bed level Upper Body Dressing : Moderate assistance, Sitting Lower Body Dressing: Moderate assistance, Sitting/lateral leans, Sit to/from stand Lower Body Dressing Details (indicate cue type and reason): pt able to reach down to B feet to pull up socks EOB without LOB today. will need assist to completely don socks or manage other clothing Toilet Transfer: Minimal assistance, +2 for physical assistance, +2 for safety/equipment, Stand-pivot, Cueing for sequencing, BSC/3in1 Toilet Transfer Details (indicate cue type and reason): Min A x 2 to pivot to [facility] from recliner. improved to Min A x 1 for transfer back to recliner after using [facility] Toileting- Clothing Manipulation and Hygiene: Total assistance, Sit to/from stand Toileting - Clothing Manipulation Details (indicate cue type and reason): Total A in standing for hygiene. OT assisting with balance while nursing assisting with hygiene     Mobility   Overal bed mobility: Needs Assistance Bed Mobility: Supine to Sit Rolling: Used rails, Min assist Supine to sit: HOB elevated, Used rails, Mod assist Sit to supine: Mod assist, +2 for physical assistance General bed mobility comments: quick movements of LE out of bed. ModA to raise trunk     Transfers   Overall transfer level: Needs assistance Equipment used: Rolling walker (2 wheels) Transfers: Sit to/from Stand Sit to Stand: Min assist Bed to/from chair/wheelchair/BSC transfer type:: Step pivot Step pivot transfers: Min assist, +2 physical assistance, +2 safety/equipment  Lateral/Scoot Transfers: Mod assist, +2 physical assistance General transfer comment: MinA to boost  up and steady with use of RW, cues for hand placement     Ambulation / Gait / Stairs / Wheelchair Mobility   Ambulation/Gait Ambulation/Gait assistance: Editor, commissioning (Feet): 16 Feet Assistive device: Rolling walker (2 wheels) Gait Pattern/deviations: Step-through pattern, Decreased step length - right, Decreased step length - left, Drifts right/left General Gait Details: pt takes short and choppy steps with MinA required to steady. Drifts L/R with cues for how to navigate RW and when to turn Gait velocity: decr     Posture / Balance Dynamic Sitting Balance Sitting balance - Comments: MinA to CGA for balance, pt with forward lean Balance Overall balance assessment: Needs assistance Sitting-balance support: Feet unsupported, Bilateral upper extremity supported Sitting balance-Leahy Scale: Fair Sitting balance - Comments: MinA to CGA for balance, pt with forward lean Postural control: Other (comment) (forward) Standing balance support: Bilateral upper extremity supported, Single extremity supported, During functional activity Standing balance-Leahy Scale: Poor Standing balance comment: reliant on RW and external support     Special needs/care consideration Continuous Drip IV  Heparin drip in place, Dialysis: {Currently receiving IHD on MWF, Oxygen currently on room air, Skin Chest incision with dressing; left groin incision with staples; right axilla with staples, and sacral wound stage 2 thickness, Abrasion: back/left, upper, Blister: neck/left,  Diabetic Management: Novolog 0-20 units 3x dialy with meals and Bowel incontinence  Previous Home Environment (from acute therapy documentation) Living Arrangements: Spouse/significant other  Lives With: Significant other Available Help at Discharge: Family, Available 24 hours/day Type of Home: House Home Layout: One level Home Access: Level entry Bathroom Shower/Tub: Engineer, manufacturing systems: Standard Home Care  Services: No   Discharge Living Setting Plans for Discharge Living Setting: Apartment, Lives with (comment) (Lives with girl friend) Type of Home at Discharge: Apartment Discharge Home Layout: One level Discharge Home Access: Level entry Discharge Bathroom Shower/Tub: Tub/shower unit, Curtain Discharge Bathroom Toilet: Standard Discharge Bathroom Accessibility: No (Door is narrow, would need to turn walker sideways) Does the patient have any problems obtaining your medications?: No   Social/Family/Support Systems Patient Roles: Other (Comment) (Has sister and a girl friend.) Contact Information: Saban Heinlen - sister - [phone] Anticipated Caregiver: GF and sister Anticipated Caregiver's Contact Information: Honor Loh - GF/SO [phone] Ability/Limitations of Caregiver: GF can work from home.  Sister has flexible job and can assist GF as needed.  Also has friends to sit with patient as needed. Caregiver Availability: 24/7 Discharge Plan Discussed with Primary Caregiver: Yes Is Caregiver In Agreement with Plan?: Yes Does Caregiver/Family have Issues with Lodging/Transportation while Pt is in Rehab?: No     Goals Patient/Family Goal for Rehab: PT/OT/SLP supervision goals Expected length of stay: 18-21 days Pt/Family Agrees to Admission and willing to participate: Yes Program Orientation Provided & Reviewed with Pt/Caregiver Including Roles  & Responsibilities: Yes     Decrease burden of Care through IP rehab admission: N/A     Possible need for SNF placement upon discharge:  Not anticipated     Patient Condition: I have reviewed medical records from [facility], spoken with CSW, and patient, spouse, and family member. I met with patient at the bedside and discussed via phone for inpatient rehabilitation assessment.  Patient will benefit from ongoing PT, OT, and SLP, can actively participate in 3 hours of therapy a day 5 days of the week, and can make measurable  gains during the admission.  Patient will also benefit from the coordinated team approach during an Inpatient Acute Rehabilitation admission.  The patient will receive intensive therapy as well as Rehabilitation physician, nursing, social worker, and care management interventions.  Due to bowel management, safety, skin/wound care, disease management, medication administration, pain management, and patient education the patient requires 24 hour a day rehabilitation nursing.  The patient is currently Min A with mobility and Mod A-Total A with basic ADLs.  Discharge setting and therapy post discharge at home with home health is anticipated.  Patient has agreed to participate in the Acute Inpatient Rehabilitation Program and will admit today.   Preadmission Screen Completed By:  Trish Mage, RN, with updates by Wolfgang Phoenix, MS, CCC-SLP  08/04/2023 1:27 PM ______________________________________________________________________   Discussed status with Dr. Wynn Banker on 08/04/23 at  1:27 PM and received approval for admission today.   Admission Coordinator:  Trish Mage, time 1:27 PM/Date 08/04/23

## 2023-08-04 NOTE — Progress Notes (Signed)
 PT Cancellation Note  Patient Details Name: ANMOL FLECK MRN: [id number] DOB: 03-12-1986   Cancelled Treatment:    Reason Eval/Treat Not Completed: (P) Patient at procedure or test/unavailable Pt off floor for dialysis. PT will follow back for treatment this afternoon as able.   Natasja Niday B. Beverely Risen PT, DPT Acute Rehabilitation Services Please use secure chat or  Call Office [phone]  Elon Alas [facility] 08/04/2023, 8:29 AM

## 2023-08-04 NOTE — TOC Progression Note (Signed)
 Transition of Care [facility]) - Progression Note    Patient Details  Name: Aaron [name] MRN: [id number] Date of Birth: 1985-08-23  Transition of Care [facility]) CM/SW Contact  Nicanor Bake Phone Number: [phone] 08/04/2023, 3:02 PM  Clinical Narrative:   2:57 PM- HF CSW received a call from patients sister inquiring about disability benefits. CSW explained that the Financial Counseling can assist. CSW notified the Financial Counseling Team for the patient to be screened.   TOC will continue following.     Expected Discharge Plan: IP Rehab Facility Barriers to Discharge: No Barriers Identified  Expected Discharge Plan and Services   Discharge Planning Services: CM Consult Post Acute Care Choice: IP Rehab Living arrangements for the past 2 months: Apartment Expected Discharge Date: 08/04/23                                     Social Determinants of Health (SDOH) Interventions SDOH Screenings   Food Insecurity: No Food Insecurity (07/10/2023)  Housing: Low Risk  (07/10/2023)  Transportation Needs: No Transportation Needs (07/10/2023)  Utilities: Not At Risk (07/10/2023)  Financial Resource Strain: Medium Risk (04/01/2023)  Tobacco Use: Low Risk  (07/10/2023)  Health Literacy: Adequate Health Literacy (04/01/2023)    Readmission Risk Interventions    07/10/2023   10:12 AM  Readmission Risk Prevention Plan  Transportation Screening Complete  PCP or Specialist Appt within 5-7 Days Complete  Home Care Screening Complete  Medication Review (RN CM) Complete

## 2023-08-04 NOTE — Discharge Summary (Signed)
 Physician Discharge Summary  Aaron [name] VHQ:[id number] DOB: 03-01-1986 DOA: 07/09/2023  [name]: Patient, Aaron [name] Per  Admit date: 07/09/2023 Discharge date: 08/04/2023 Recommendations for Outpatient Follow-up:  Follow up at [facility] NEPHRO [facility]ARD[facility] TO F/U at [facility]  Discharge Dispo: cir Discharge Condition: Stable Code Status:   Code Status: Full Code Diet recommendation:  Diet Order             Diet renal with fluid restriction Fluid restriction: 1500 mL Fluid; Room service appropriate? Yes; Fluid consistency: Thin  Diet effective now                    Brief/Interim Summary: 38 year old male with hypothyroidism, PAF on anticoagulation (per notes nonadherent to Eliquis), severe mitral regurgitation comes into the hospital on 2/26 with shortness of breath and admitted for acute hypoxic respiratory failure , PE,A-fib with RVR, chronic HFpEF, severe MR and hypothyroidism with TSH less than 0.01 Free T4 2.15-placed on heparin,also suspected thyrotoxicosis-placed on beta-blockers,resumed methimazole, steroids but he rapidly deteriorated 2/27 went to respiratory arrest with V-fib arrest.Underwent 40 minutes of CPR followed by ROSC.Post arrest course complicated by ATN requiring CRRT now on HD, he was also on ECMO status post decannulation on 3/8, also required Impella eventually removed 3/12.  He failed extubation x 1 due to mucous plugging, but eventually extubated 3/16.  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 Currently waiting for renal recovery and planning for rehabilitation- cont heparin gtt until HD line is sorted out- nephro to plan. Patient has been approved for CIR   Significant events: 2/26 admitted +PE 2/27 Vtac/ Vfib arrest> rosc after 40 mins, s/p TNK,  VA ecmo 3/5 salvage TEER (transcatheter edge to edge mitral valve repair) 3/7 weaned off VA 3/8 bronch for mucus plugging, VA ecmo decannulation >impella 5.5 3/12 impella removed, extubation trial 2/28  CRRT 3/13 reintubated, bronch for recurrent plugging, HD cath rewired due to malfunction 3/15 CRRT stopped, remains anuric 3/16 awake f/c, extubated 3/22 core track discontinued 3/24 HD  Consultation: Cardiology Nephrology Palliative care Vascular surgery Critical care.  Discharge diagnoses:  Primary problem:Cardiogenic shock Acute systolic CHF Severe MR VT/NSVT:  Suspected hypoxic respiratory failure drainage with segmental PE in the background of severe MR ,nodal  blockage and thyrotoxicosis-suspected downtime about 20 minutes.  Managed by CHF/HF team, S/P ECMO with Impella, decannulated on 3/8 and Impella removed and extubated on 3/12-reintubated but eventually extubated again on 3/16 Currently doing well on room air. Initially needed pressors, currently vitals stable. Repeat echo shows improved EF at 40-45%.Continue amiodarone for now and plans to stop on DC. GDMT limited by AKI/CKD.   Acute kidney injury due to ATN Hyperkalemia Uremia: following cardiac arrest.Required CRRT,now on IHD. Nephrology managing continue HD may need few more sessions.Urine output increasing -hopefully recover soon Discussed with Dr. Malen Gauze -will need to look at his renal function and labs on Wednesday to see if he still needs hemodialysis.   Severe MR: S/p mitral transcatheter edge-to-edge repair (mTEER) on 07/16/2023 with placement of 3 clips and improvement in MR from severe to mild   Acute respiratory failure with hypoxia Pulmonary hypertension Acute PE: Hemodynamic stable.  On room air.    Currently on IV heparin DRIP > plan to switch to Eliquis once HD sorted out and if he does not need any dialysis or if it does not need to be changed to [facility] we will switch to Eliquis   Hyponatremia: Cont fluid management with HD  PAF: In NSR. Continue oral amiodarone.  On heparin.    Hyperthyroidism Possibly thyrotoxicosis: Doing well currently.  Continue methimazole.Need to check TSH in 4 weeks.  Last TSH done 3/14 at 0.03   ID: previously on Vanco meropenem and micafungin, now off antibiotics.  Cultures without growth.  Remains afebrile   Normocytic anemia: Hb stable   Transaminitis: Likely shock liver.  Monitor labs intermittently  Acute small right CVA : MRI of the head on 07/24/2022 shows marked fluid to the right cerebellar and cerebral hemisphere. Suspect embolic, continue IV heparin.   Deconditioning/debility: unclear his posthospitalization needs   Stage II sacral cubitus ulcer: Noted  Severe malnutrition: Augment diet as tolerated RD following Etiology: acute illness Signs/Symptoms: moderate fat depletion, moderate muscle depletion Interventions: Refer to RD note for recommendations.    Subjective: AAOX3 DOING WELL in hd  Discharge Exam: Vitals:   08/04/23 1132 08/04/23 1230  BP: 126/88 124/82  Pulse: 88 94  Resp: (!) 24 19  Temp:  98.5 F (36.9 C)  SpO2: 98% 98%   General: Pt is alert, awake, not in acute distress Cardiovascular: RRR, S1/S2 +, Aaron rubs, Aaron gallops Respiratory: CTA bilaterally, Aaron wheezing, Aaron rhonchi Abdominal: Soft, NT, ND, bowel sounds + Extremities: Aaron edema, Aaron cyanosis  Discharge Instructions  Discharge Instructions     (HEART FAILURE PATIENTS) Call MD:  Anytime you have any of the following symptoms: 1) 3 pound weight gain in 24 hours or 5 pounds in 1 week 2) shortness of breath, with or without a dry hacking cough 3) swelling in the hands, feet or stomach 4) if you have to sleep on extra pillows at night in order to breathe.   Complete by: As directed    Discharge wound care:   Complete by: As directed    Foam dressing on sacrum      Allergies as of 08/04/2023   Aaron Known Allergies      Medication List     STOP taking these medications    apixaban 5 MG Tabs tablet Commonly known as: ELIQUIS   Metoprolol Tartrate 37.5 MG Tabs   spironolactone 25 MG tablet Commonly known as: ALDACTONE       TAKE these  medications    amiodarone 200 MG tablet Commonly known as: PACERONE Take 1 tablet (200 mg total) by mouth daily. Start taking on: August 05, 2023   arformoterol 15 MCG/2ML Nebu Commonly known as: BROVANA Take 2 mLs (15 mcg total) by nebulization 2 (two) times daily.   ascorbic acid 250 MG tablet Commonly known as: VITAMIN C Take 1 tablet (250 mg total) by mouth 2 (two) times daily.   aspirin 81 MG chewable tablet Chew 1 tablet (81 mg total) by mouth daily. Start taking on: August 05, 2023   bismuth subsalicylate 262 MG chewable tablet Commonly known as: PEPTO BISMOL Chew 524 mg by mouth as needed for indigestion.   budesonide 0.25 MG/2ML nebulizer solution Commonly known as: PULMICORT Take 2 mLs (0.25 mg total) by nebulization 2 (two) times daily.   Darbepoetin Alfa 200 MCG/0.4ML Sosy injection Commonly known as: ARANESP Inject 0.4 mLs (200 mcg total) into the skin every Friday at 6 PM. Start taking on: August 08, 2023   folic acid 1 MG tablet Commonly known as: FOLVITE Take 1 tablet (1 mg total) by mouth daily. Start taking on: August 05, 2023   heparin 62952 UT/250ML infusion Inject 1,900 Units/hr into the vein continuous.   lactulose 10 GM/15ML solution Commonly  known as: CHRONULAC Take 45 mLs (30 g total) by mouth 2 (two) times daily as needed for mild constipation.   loperamide HCl 1 MG/7.5ML suspension Commonly known as: IMODIUM Take 15 mLs (2 mg total) by mouth 2 (two) times daily as needed for diarrhea or loose stools.   methimazole 10 MG tablet Commonly known as: TAPAZOLE Take 1 tablet (10 mg total) by mouth every 8 (eight) hours.   multivitamin Tabs tablet Take 1 tablet by mouth at bedtime.   revefenacin 175 MCG/3ML nebulizer solution Commonly known as: YUPELRI Take 3 mLs (175 mcg total) by nebulization daily. Start taking on: August 05, 2023   thiamine 100 MG tablet Commonly known as: Vitamin B-1 Take 1 tablet (100 mg total) by mouth daily. Start  taking on: August 05, 2023   zinc sulfate (50mg  elemental zinc) 220 (50 Zn) MG capsule Take 1 capsule (220 mg total) by mouth daily. Start taking on: August 05, 2023               Discharge Care Instructions  (From admission, onward)           Start     Ordered   08/04/23 0000  Discharge wound care:       Comments: Foam dressing on sacrum   08/04/23 1233            Aaron Known Allergies  The results of significant diagnostics from this hospitalization (including imaging, microbiology, ancillary and laboratory) are listed below for reference.    Microbiology: Aaron results found for this or any previous visit (from the past 240 hours).  Procedures/Studies: ECHOCARDIOGRAM COMPLETE Result Date: 08/01/2023    ECHOCARDIOGRAM REPORT   Patient Name:   Aaron [name] Date of Exam: 08/01/2023 Medical Rec #:  [id number]    Height:       76.0 in Accession #:    [id number]   Weight:       173.1 lb Date of Birth:  1985/11/24    BSA:          2.083 m Patient Age:    37 years     BP:           150/92 mmHg Patient Gender: M            HR:           82 bpm. Exam Location:  Inpatient Procedure: 2D Echo (Both Spectral and Color Flow Doppler were utilized during            procedure). Indications:    mitral valve disorder  History:        Patient has prior history of Echocardiogram examinations, most                 recent 07/21/2023. Arrythmias:Cardiac Arrest; Risk                 Factors:Hypertension.                  Mitral Valve: Mitra-Clip valve is present in the mitral                 position. Procedure Date: 07/16/2023.  Sonographer:    Delcie Roch RDCS Referring Phys: [phone] ALMA L DIAZ IMPRESSIONS  1. Left ventricular ejection fraction, by estimation, is 40 to 45%. Left ventricular ejection fraction by PLAX is 41 %. The left ventricle has mildly decreased function. The left ventricle demonstrates global hypokinesis. There is mild left ventricular hypertrophy. Left ventricular diastolic  parameters are consistent with Grade I diastolic dysfunction (impaired relaxation).  2. Right ventricular systolic function is normal. The right ventricular size is normal. Tricuspid regurgitation signal is inadequate for assessing PA pressure.  3. Left atrial size was mildly dilated.  4. Right atrial size was mildly dilated.  5. 2 Mitra-clips noted in the A2-P2 position. The valve is hypermobile with the SAM of the anterior mitral leaflet, but Aaron outflow obstruction - there is moderate stenosis with an expected double oreface valve. The mitral valve has been repaired/replaced. Mild mitral valve regurgitation. Moderate mitral stenosis. The mean mitral valve gradient is 7.0 mmHg with average heart rate of 79 bpm. There is a Mitra-Clip present in the mitral position. Procedure Date: 07/16/2023.  6. The aortic valve is tricuspid. Aortic valve regurgitation is trivial. Aortic valve sclerosis/calcification is present, without any evidence of aortic stenosis.  7. The inferior vena cava is normal in size with greater than 50% respiratory variability, suggesting right atrial pressure of 3 mmHg.  8. Evidence of atrial level shunting detected by color flow Doppler. There is a small secundum atrial septal defect with predominantly left to right shunting across the atrial septum. Comparison(s): Changes from prior study are noted. 07/21/2023: LVEF 35-40%. FINDINGS  Left Ventricle: Left ventricular ejection fraction, by estimation, is 40 to 45%. Left ventricular ejection fraction by PLAX is 41 %. The left ventricle has mildly decreased function. The left ventricle demonstrates global hypokinesis. The left ventricular internal cavity size was normal in size. There is mild left ventricular hypertrophy. Left ventricular diastolic parameters are consistent with Grade I diastolic dysfunction (impaired relaxation). Indeterminate filling pressures. Right Ventricle: The right ventricular size is normal. Aaron increase in right ventricular wall  thickness. Right ventricular systolic function is normal. Tricuspid regurgitation signal is inadequate for assessing PA pressure. Left Atrium: Left atrial size was mildly dilated. Right Atrium: Right atrial size was mildly dilated. Pericardium: There is Aaron evidence of pericardial effusion. Mitral Valve: 2 Mitra-clips noted in the A2-P2 position. The valve is hypermobile with the SAM of the anterior mitral leaflet, but Aaron outflow obstruction - there is moderate stenosis with an expected double oreface valve. The mitral valve has been repaired/replaced. Mild mitral valve regurgitation. There is a Mitra-Clip present in the mitral position. Procedure Date: 07/16/2023. Moderate mitral valve stenosis. MV peak gradient, 15.1 mmHg. The mean mitral valve gradient is 7.0 mmHg with average heart  rate of 79 bpm. Tricuspid Valve: The tricuspid valve is grossly normal. Tricuspid valve regurgitation is trivial. Aortic Valve: The aortic valve is tricuspid. Aortic valve regurgitation is trivial. Aortic valve sclerosis/calcification is present, without any evidence of aortic stenosis. Pulmonic Valve: The pulmonic valve was normal in structure. Pulmonic valve regurgitation is not visualized. Aorta: The aortic root and ascending aorta are structurally normal, with Aaron evidence of dilitation. Venous: The inferior vena cava is normal in size with greater than 50% respiratory variability, suggesting right atrial pressure of 3 mmHg. IAS/Shunts: Evidence of atrial level shunting detected by color flow Doppler. There is a small secundum atrial septal defect with predominantly left to right shunting across the atrial septum.  LEFT VENTRICLE PLAX 2D LV EF:         Left            Diastology                ventricular     LV e' medial:    5.98 cm/s  ejection        LV E/e' medial:  23.9                fraction by     LV e' lateral:   7.62 cm/s                PLAX is 41      LV E/e' lateral: 18.8                %. LVIDd:         5.50  cm LVIDs:         4.40 cm LV PW:         1.00 cm LV IVS:        1.10 cm LVOT diam:     2.60 cm LV SV:         104 LV SV Index:   50 LVOT Area:     5.31 cm  RIGHT VENTRICLE             IVC RV Basal diam:  3.10 cm     IVC diam: 1.80 cm RV S prime:     16.30 cm/s TAPSE (M-mode): 2.5 cm LEFT ATRIUM             Index        RIGHT ATRIUM           Index LA diam:        4.00 cm 1.92 cm/m   RA Area:     19.10 cm LA Vol (A2C):   84.7 ml 40.66 ml/m  RA Volume:   48.40 ml  23.23 ml/m LA Vol (A4C):   72.4 ml 34.76 ml/m LA Biplane Vol: 82.7 ml 39.70 ml/m  AORTIC VALVE LVOT Vmax:   130.00 cm/s LVOT Vmean:  85.000 cm/s LVOT VTI:    0.196 m  AORTA Ao Root diam: 3.20 cm Ao Asc diam:  3.50 cm MITRAL VALVE MV Area VTI:  1.94 cm      SHUNTS MV Peak grad: 15.1 mmHg     Systemic VTI:  0.20 m MV Mean grad: 7.0 mmHg      Systemic Diam: 2.60 cm MV Vmax:      1.94 m/s MV Vmean:     131.0 cm/s MV E velocity: 143.00 cm/s MV A velocity: 191.00 cm/s MV E/A ratio:  0.75 Zoila Shutter MD Electronically signed by Zoila Shutter MD Signature Date/Time: 08/01/2023/5:47:48 PM    Final    DG Abd 1 View Result Date: 07/31/2023 CLINICAL DATA:  Feeding tube placement. EXAM: ABDOMEN - 1 VIEW COMPARISON:  Abdominal radiograph dated 07/24/2023. FINDINGS: Feeding tube with tip in the region of the distal stomach. IMPRESSION: Feeding tube with tip in the distal stomach. Electronically Signed   By: Elgie Collard M.D.   On: 07/31/2023 09:37   DG CHEST PORT 1 VIEW Result Date: 07/31/2023 CLINICAL DATA:  Dialysis catheter dysfunction. EXAM: PORTABLE CHEST 1 VIEW COMPARISON:  Chest radiograph dated 07/25/2023. FINDINGS: Interval removal of the right-sided PICC and left IJ central venous line. Left subclavian catheter with tip over upper SVC. Feeding tube extends below the diaphragm with tip beyond the inferior margin of the image. Aaron significant interval change in bilateral pulmonary opacities and small left pleural effusion. Aaron pneumothorax. Stable  cardiac silhouette. Aaron acute osseous pathology. IMPRESSION: 1. Interval removal of the right-sided PICC and left IJ central venous line. 2. Aaron significant interval change in bilateral pulmonary opacities and small left pleural effusion.  Electronically Signed   By: Elgie Collard M.D.   On: 07/31/2023 09:37   DG Swallowing Func-Speech Pathology Result Date: 07/29/2023 Table formatting from the original result was not included. Modified Barium Swallow Study Patient Details Name: BROUGHTON EPPINGER MRN: [id number] Date of Birth: May 22, 1985 Today's Date: 07/29/2023 HPI/PMH: HPI: MALIKE FOGLIO is a 38 yo male presenting to ED 2/26 with chest pain and dyspnea. Admitted with bilateral lower lobe pulmonary emboli. VT/VF cardiac arrest 2/27, requiring 40 minutes of CPR and multiple shocks. Impella placed 2/27-3/12. ECMO initiated 2/27-3/8. CRRT stopped 3/15. ETT 2/27-3/12, reintubated 3/12-3/16 s/p mucus plugging and L lung opacification. 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 PMH includes hypothyroidism, paroxysmal A-fib, severe mitral regurgitation Clinical Impression: Pt exhibits severe pharyngeal dysphagia with concern for glottal insufficiency after prolonged intubation. He remains aphonic with a huff-like cough that is ineffective at clearing his airway. Thin and nectar thick liquids initially reached the level of the vocal folds before progressing further and being silently aspirated (PAS 8). A L and R head turn resulted in gross silent aspiration. Pt denies the urge to cough and his cued cough lacks crispness and strength. Recommend Dys 3 solids with honey thick liquids via cup or spoon and full supervision. Given difference in presentation when pt performed head turns in addition to aphonia, recommend ENT consult to assess vocal fold function. SLP will continue following. DIGEST Swallow Severity Rating*  Safety: 4   Efficiency: 1  Overall Pharyngeal Swallow Severity: 3 (severe) 1: mild; 2: moderate; 3: severe; 4: profound *The Dynamic Imaging Grade of Swallowing Toxicity is standardized for the head and neck cancer population, however, demonstrates promising clinical applications across populations to standardize the clinical rating of pharyngeal swallow safety and severity. Factors that may increase risk of adverse event in presence of aspiration [name]: Factors that may increase risk of adverse event in presence of aspiration [name]: Poor general health and/or compromised immunity; Frail or deconditioned; Presence of tubes (ETT, trach, NG, etc.) Recommendations/Plan: Swallowing Evaluation Recommendations Swallowing Evaluation Recommendations Recommendations: PO diet PO Diet Recommendation: Dysphagia 3 (Mechanical soft); Moderately thick liquids (Level 3, honey thick) Liquid Administration via: Spoon; Cup Medication Administration: Crushed with puree Supervision: Staff to assist with self-feeding; Full supervision/cueing for swallowing strategies Swallowing strategies  : Minimize environmental distractions; Slow rate; Small bites/sips Postural changes: Position pt fully upright for meals; Stay upright 30-60 min after meals Oral care recommendations: Oral care QID (4x/day); Oral care before PO; Use suctioning for oral care Recommended consults: Consider ENT consultation Caregiver Recommendations: Avoid jello, ice cream, thin soups, popsicles; Remove water pitcher Treatment Plan Treatment Plan Treatment recommendations: Therapy as outlined in treatment plan below Follow-up recommendations: Acute inpatient rehab (3 hours/day) Functional status assessment: Patient has had a recent decline in their functional status and demonstrates the ability to make significant improvements in function in a reasonable and predictable amount of time. Treatment frequency: Min 2x/week Treatment duration: 2 weeks  Interventions: Aspiration precaution training; Compensatory techniques; Patient/family education; Trials of upgraded texture/liquids; Diet toleration management by SLP; Respiratory muscle strength training Recommendations Recommendations for follow up therapy are one component of a multi-disciplinary discharge planning process, led by the attending physician.  Recommendations may be updated based on patient status, additional functional criteria and insurance authorization. Assessment: Orofacial Exam: Orofacial Exam Oral Cavity: Oral Hygiene: WFL Oral Cavity - Dentition: Adequate natural dentition Orofacial Anatomy:  WFL Oral Motor/Sensory Function: WFL Anatomy: Anatomy: WFL Boluses Administered: Boluses Administered Boluses Administered: Thin liquids (Level 0); Mildly thick liquids (Level 2, nectar thick); Moderately thick liquids (Level 3, honey thick); Puree; Solid  Oral Impairment Domain: Oral Impairment Domain Lip Closure: Interlabial escape, Aaron progression to anterior lip Tongue control during bolus hold: Cohesive bolus between tongue to palatal seal Bolus preparation/mastication: Timely and efficient chewing and mashing Bolus transport/lingual motion: Brisk tongue motion Oral residue: Complete oral clearance Location of oral residue : N/A Initiation of pharyngeal swallow : Posterior laryngeal surface of the epiglottis  Pharyngeal Impairment Domain: Pharyngeal Impairment Domain Soft palate elevation: Aaron bolus between soft palate (SP)/pharyngeal wall (PW) Laryngeal elevation: Complete superior movement of thyroid cartilage with complete approximation of arytenoids to epiglottic petiole Anterior hyoid excursion: Complete anterior movement Epiglottic movement: Complete inversion Laryngeal vestibule closure: Complete, Aaron air/contrast in laryngeal vestibule Pharyngeal stripping wave : Present - complete Pharyngeal contraction (A/P view only): N/A Pharyngoesophageal segment opening: Complete distension and complete  duration, Aaron obstruction of flow Tongue base retraction: Trace column of contrast or air between tongue base and PPW Pharyngeal residue: Trace residue within or on pharyngeal structures Location of pharyngeal residue: Valleculae; Pyriform sinuses  Esophageal Impairment Domain: Aaron data recorded Pill: Aaron data recorded Penetration/Aspiration Scale Score: Penetration/Aspiration Scale Score 1.  Material does not enter airway: Moderately thick liquids (Level 3, honey thick); Puree; Solid 5.  Material enters airway, CONTACTS cords and not ejected out: Mildly thick liquids (Level 2, nectar thick) 8.  Material enters airway, passes BELOW cords without attempt by patient to eject out (silent aspiration) : Thin liquids (Level 0) Compensatory Strategies: Compensatory Strategies Compensatory strategies: Yes Straw: Ineffective Ineffective Straw: Thin liquid (Level 0); Mildly thick liquid (Level 2, nectar thick) Chin tuck: Effective; Ineffective Effective Chin Tuck: Mildly thick liquid (Level 2, nectar thick) Ineffective Chin Tuck: Thin liquid (Level 0) Left head turn: Ineffective Ineffective Left Head Turn: Thin liquid (Level 0) Right head turn: Ineffective Ineffective Right Head Turn: Thin liquid (Level 0)   General Information: Caregiver present: Aaron  Diet Prior to this Study: NPO; Cortrak/Small bore NG tube   Temperature : Normal   Respiratory Status: WFL   Supplemental O2: Nasal cannula   History of Recent Intubation: Yes  Behavior/Cognition: Alert; Cooperative; Requires cueing Self-Feeding Abilities: Dependent for feeding Baseline vocal quality/speech: Aphonic Volitional Cough: Able to elicit Volitional Swallow: Able to elicit Exam Limitations: Aaron limitations Goal Planning: Prognosis for improved oropharyngeal function: Good Barriers to Reach Goals: Cognitive deficits; Time post onset; Severity of deficits Aaron data recorded Patient/Family Stated Goal: none stated Consulted and agree with results and recommendations: Patient;  Nurse Pain: Pain Assessment Pain Assessment: Aaron/denies pain Faces Pain Scale: 0 End of Session: Start Time:SLP Start Time (ACUTE ONLY): 1255 Stop Time: SLP Stop Time (ACUTE ONLY): 1316 Time Calculation:SLP Time Calculation (min) (ACUTE ONLY): 21 min Charges: SLP Evaluations $ SLP Speech Visit: 1 Visit SLP Evaluations $BSS Swallow: 1 Procedure $MBS Swallow: 1 Procedure SLP visit diagnosis: SLP Visit Diagnosis: Dysphagia, pharyngeal phase (R13.13) Past Medical History: Past Medical History: Diagnosis Date  Atrial fibrillation (HCC)   Hyperthyroidism   Mitral regurgitation   NSTEMI (non-ST elevated myocardial infarction) [facility])  Past Surgical History: Past Surgical History: Procedure Laterality Date  ECMO CANNULATION N/A 07/10/2023  Procedure: ECMO CANNULATION;  Surgeon: Dolores Patty, MD;  Location: [facility] INVASIVE CV LAB;  Service: Cardiovascular;  Laterality: N/A;  EMBOLECTOMY Left 07/18/2023  Procedure: EMBOLECTOMY Left Femoral, Popliteal and iliac arterys,  Repair Left common femoral artery.;  Surgeon: Victorino Sparrow, MD;  Location: [facility] OR;  Service: Vascular;  Laterality: Left;  INTRAOPERATIVE TRANSESOPHAGEAL ECHOCARDIOGRAM N/A 07/23/2023  Procedure: ECHOCARDIOGRAM, TRANSESOPHAGEAL, INTRAOPERATIVE;  Surgeon: Loreli Slot, MD;  Location: [facility] OR;  Service: Open Heart Surgery;  Laterality: N/A;  LEFT HEART CATH AND CORONARY ANGIOGRAPHY N/A 10/22/2021  Procedure: LEFT HEART CATH AND CORONARY ANGIOGRAPHY;  Surgeon: Yvonne Kendall, MD;  Location: [facility] INVAS[facility] CV LAB;  Service: Cardiovascular;  Laterality: N/A;  PLACEMENT OF IMPELLA LEFT VENTRICULAR ASSIST DEVICE Right 07/14/2023  Procedure: PLACEMENT OF RIGHT AXILLARY IMPELLA 5.5 LEFT VENTRICULAR ASSIST DEVICE;  Surgeon: Loreli Slot, MD;  Location: [facility] OR;  Service: Open Heart Surgery;  Laterality: Right;  REMOVAL OF IMPELLA LEFT VENTRICULAR ASSIST DEVICE Right 07/14/2023  Procedure: REMOVAL OF CP RIGHT FEMORAL IMPELLA LEFT VENTRICULAR ASSIST DEVICE;   Surgeon: Loreli Slot, MD;  Location: [facility] OR;  Service: Open Heart Surgery;  Laterality: Right;  REMOVAL OF IMPELLA LEFT VENTRICULAR ASSIST DEVICE N/A 07/23/2023  Procedure: REMOVAL, CARDIAC ASSIST DEVICE, IMPELLA;  Surgeon: Loreli Slot, MD;  Location: [facility] OR;  Service: Open Heart Surgery;  Laterality: N/A;  TRANSCATHETER MITRAL EDGE TO EDGE REPAIR N/A 07/16/2023  Procedure: TRANSCATHETER MITRAL EDGE TO EDGE REPAIR;  Surgeon: Tonny Bollman, MD;  Location: [facility] INVASIVE CV LAB;  Service: Cardiovascular;  Laterality: N/A;  TRANSESOPHAGEAL ECHOCARDIOGRAM (CATH LAB) N/A 07/16/2023  Procedure: TRANSESOPHAGEAL ECHOCARDIOGRAM;  Surgeon: Tonny Bollman, MD;  Location: [facility] INVASIVE CV LAB;  Service: Cardiovascular;  Laterality: N/A;  VENTRICULAR ASSIST DEVICE INSERTION N/A 07/10/2023  Procedure: VENTRICULAR ASSIST DEVICE INSERTION;  Surgeon: Dolores Patty, MD;  Location: [facility] CV LAB;  Service: Cardiovascular;  Laterality: N/A; Gwynneth Aliment, M.A., CF-SLP Speech Language Pathology, Acute Rehabilitation Services Secure Chat preferred [phone] 07/29/2023, 1:49 PM  DG Chest Port 1 View Result Date: 07/25/2023 CLINICAL DATA:  ET tube placement EXAM: PORTABLE CHEST 1 VIEW COMPARISON:  X-ray 07/24/2023 and older FINDINGS: Stable ET tube, enteric tube, right-sided PICC, left IJ line and left subclavian line. Metallic foci overlie the left side of the heart as well. There are surgical clips and skin staples along the right shoulder, axillary region. Stable cardiopericardial silhouette with vascular congestion. Persistent left retrocardiac opacity. Aaron pneumothorax. The inferior costophrenic angles also show a possible small left effusion. IMPRESSION: Aaron significant interval change when adjusted for technique Electronically Signed   By: Karen Kays M.D.   On: 07/25/2023 10:13   DG Chest Port 1 View Result Date: 07/24/2023 CLINICAL DATA:  Status post central line placement. EXAM: PORTABLE CHEST 1 VIEW  COMPARISON:  July 24, 2023 (1:24 a.m.) FINDINGS: Since the prior study there is been further advancement of the previously noted left-sided subclavian catheter. Its distal tip now sits within the distal aspect of the superior vena cava. The additional catheters, ET tube and orogastric tube seen on the prior study are unchanged in position. Since the prior exam there is improved aeration of the left upper lobe with persistent mild to moderate severity bilateral left perihilar and right infrahilar infiltrates. Moderate severity left basilar atelectasis and/or infiltrate is seen within the retrocardiac region of the left lung base. There is a small left pleural effusion. Aaron pneumothorax is identified. The visualized skeletal structures are unremarkable. IMPRESSION: 1. Interval advancement of the left-sided subclavian catheter with its distal tip now within the distal aspect of the superior vena cava. 2. Improved aeration of the left upper lobe with persistent bilateral left perihilar and right infrahilar  infiltrates. 3. Moderate severity left basilar atelectasis and/or infiltrate. 4. Small left pleural effusion. Electronically Signed   By: Aram Candela M.D.   On: 07/24/2023 19:34   MR BRAIN WO CONTRAST Result Date: 07/24/2023 CLINICAL DATA:  Anoxic brain damage.  Cardiac arrest EXAM: MRI HEAD WITHOUT CONTRAST TECHNIQUE: Multiplanar, multiecho pulse sequences of the brain and surrounding structures were obtained without intravenous contrast. COMPARISON:  Head CT 07/10/2023 FINDINGS: Brain: Small acute infarcts in the right cerebral white matter and peripheral right cerebellum. Aaron indication of superimposed global anoxic injury. Aaron hydrocephalus, mass, or collection. Minimal blood products at right occipital white matter infarction attributed to petechial hemorrhage in this setting. Vascular: Major flow voids are preserved Skull and upper cervical spine: Normal marrow signal Sinuses/Orbits: Extensive bilateral  mastoid opacification in the setting of intubation. IMPRESSION: Small acute infarcts in the right cerebral and cerebellar hemispheres. Aaron generalized finding to implicate global anoxic injury. Electronically Signed   By: Tiburcio Pea M.D.   On: 07/24/2023 10:33   DG Abd 1 View Result Date: 07/24/2023 CLINICAL DATA:  5626 Acute respiratory failure [facility]) [id number] Encounter for feeding tube placement [id number] EXAM: ABDOMEN - 1 VIEW COMPARISON:  X-ray abdomen 07/17/2023, chest abdomen pelvis 07/10/2023 FINDINGS: Enteric tube with tip and side port overlying the gastric lumen. The bowel gas pattern is normal. Aaron radio-opaque calculi or other significant radiographic abnormality are seen. IMPRESSION: Enteric tube in good position. Electronically Signed   By: Tish Frederickson M.D.   On: 07/24/2023 02:48   DG Chest Port 1 View Result Date: 07/24/2023 CLINICAL DATA:  Acute respiratory failure EXAM: PORTABLE CHEST 1 VIEW COMPARISON:  Chest x-ray 07/23/2023.  Chest CT 07/10/2023. FINDINGS: Endotracheal tube tip is 5.2 cm above the carina. Right-sided central venous catheter tip ends in the SVC. Left IJ catheter ends in the SVC. Left subclavian catheter ends in the level of the brachiocephalic vein, unchanged. Enteric tube extends below the diaphragm. The heart is enlarged, unchanged. There central pulmonary vascular congestion. There is improved aeration in the left lower lung. There is complete opacification of the upper right hemithorax similar to prior. There is a small layering left pleural effusion. Osseous structures are stable. IMPRESSION: 1. Improved aeration in the left lower lung. 2. Stable complete opacification of the upper right hemithorax. 3. Small layering left pleural effusion. 4. Stable cardiomegaly and central pulmonary vascular congestion. Electronically Signed   By: Darliss Cheney M.D.   On: 07/24/2023 02:32   DG Chest Port 1 View Result Date: 07/24/2023 CLINICAL DATA:  Acute respiratory failure,  hypoxia EXAM: PORTABLE CHEST 1 VIEW COMPARISON:  07/22/2023 FINDINGS: There is abrupt cut off of the left mainstem bronchus and there is now complete atelectasis of the left lung with marked mediastinal shift to the left and complete opacification of left hemithorax. The findings suggest a obstructing endobronchial lesion such as a mucous plug or aspirated foreign body. Left subclavian temporary hemodialysis catheter left internal jugular central venous catheter are unchanged in position though deviated to the left by mediastinal shift. Right upper extremity PICC line has been placed with its tip overlying the expected superior vena cava. Right lung is clear. Aaron pneumothorax. Aaron pleural effusion right. IMPRESSION: 1. Complete atelectasis of the left lung with marked mediastinal shift to the left and complete opacification of left hemithorax. The findings suggest a obstructing endobronchial lesion such as a mucous plug or aspirated foreign body. 2. Interval placement of right upper extremity PICC line, tip within the superior vena  cava. Electronically Signed   By: Helyn Numbers M.D.   On: 07/24/2023 00:53   [place] EKG SITE RITE Result Date: 07/23/2023 If Site Rite image not attached, placement could not be confirmed due to current cardiac rhythm.  DG Chest Port 1 View Result Date: 07/22/2023 CLINICAL DATA:  Follow-up left basilar atelectasis EXAM: PORTABLE CHEST 1 VIEW COMPARISON:  07/20/2023 FINDINGS: Endotracheal tube and gastric catheter are noted in satisfactory position. Swan-Ganz catheter is noted in the pulmonary outflow tract. Impella catheter is noted from the right arm. Left jugular central line is noted at the cavoatrial junction. Temporary dialysis catheter is noted on the left with the tip in the left innominate vein. The lungs are well aerated bilaterally. Persistent left retrocardiac density is noted. Mitra clips are seen. IMPRESSION: Tubes and lines as described above. Persistent left basilar  consolidation. Electronically Signed   By: Alcide Clever M.D.   On: 07/22/2023 10:39   ECHOCARDIOGRAM LIMITED Result Date: 07/21/2023    ECHOCARDIOGRAM LIMITED REPORT   Patient Name:   EDAN JUDAY Date of Exam: 07/21/2023 Medical Rec #:  [id number]    Height:       76.0 in Accession #:    [id number]   Weight:       200.2 lb Date of Birth:  [birth year]    BSA:          2.216 m Patient Age:    37 years     BP:           0/0 mmHg Patient Gender: M            HR:           92 bpm. Exam Location:  Inpatient Procedure: Limited Echo, Limited Color Doppler and Cardiac Doppler (Both            Spectral and Color Flow Doppler were utilized during procedure). Indications:    CHF  History:        Patient has Aaron prior history of Echocardiogram examinations and                 Patient has prior history of Echocardiogram examinations, most                 recent 07/19/2023. Mitral Valve Prolapse, Arrythmias:Cardiac                 Arrest and Atrial Fibrillation; Risk Factors:Hypertension.  Sonographer:    Amy Chionchio Referring Phys: [id number] DALTON S MCLEAN IMPRESSIONS  1. Impella 5.5 in LV, inflow is about 5.5 cm from the aortic valve. Position looks acceptable. Left ventricular ejection fraction, by estimation, is 35 to 40%. The left ventricle has moderately decreased function. The left ventricle demonstrates regional wall motion abnormalities with anterior and septal hypokinesis. There is mild concentric left ventricular hypertrophy.  2. Right ventricular systolic function is normal. The right ventricular size is normal.  3. S/p Mitraclip with mild residual mitral regurgitation. Mean gradient 7 mmHg.  4. Limited echo for Impella. FINDINGS  Left Ventricle: Impella 5.5 in LV, inflow is about 5.5 cm from the aortic valve. Position looks acceptable. Left ventricular ejection fraction, by estimation, is 35 to 40%. The left ventricle has moderately decreased function. The left ventricle demonstrates regional wall motion abnormalities.  The left ventricular internal cavity size was normal in size. There is mild concentric left ventricular hypertrophy. Right Ventricle: The right ventricular size is normal. Right ventricular systolic function is normal. Left Atrium: Left atrial size  was normal in size. Right Atrium: Right atrial size was normal in size. Mitral Valve: S/p Mitraclip with mild residual mitral regurgitation. Mean gradient 7 mmHg. There is a Mitra-Clip present in the mitral position. MV peak gradient, 11.8 mmHg. The mean mitral valve gradient is 6.5 mmHg. Aorta: The aortic root and ascending aorta are structurally normal, with Aaron evidence of dilitation. LEFT VENTRICLE PLAX 2D LVIDd:         4.60 cm LVIDs:         4.10 cm LV PW:         1.20 cm LV IVS:        1.20 cm  RIGHT VENTRICLE RV Basal diam:  3.70 cm RV Mid diam:    3.30 cm RV S prime:     13.40 cm/s TAPSE (M-mode): 1.9 cm RIGHT ATRIUM           Index RA Area:     16.70 cm RA Volume:   50.10 ml  22.61 ml/m   AORTA Ao Asc diam: 3.20 cm MITRAL VALVE MV Peak grad: 11.8 mmHg MV Mean grad: 6.5 mmHg MV Vmax:      1.72 m/s MV Vmean:     122.0 cm/s Dalton McleanMD Electronically signed by Wilfred Lacy Signature Date/Time: 07/21/2023/2:57:35 PM    Final    ECHOCARDIOGRAM LIMITED Result Date: 07/21/2023    ECHOCARDIOGRAM LIMITED REPORT   Patient Name:   KEVON TENCH Date of Exam: 07/20/2023 Medical Rec #:  [id number]    Height:       76.0 in Accession #:    [id number]   Weight:       199.1 lb Date of Birth:  1985/08/11    BSA:          2.211 m Patient Age:    37 years     BP:           141/71 mmHg Patient Gender: M            HR:           58 bpm. Exam Location:  Inpatient Procedure: 2D Echo, Cardiac Doppler and Color Doppler (Both Spectral and Color            Flow Doppler were utilized during procedure). Indications:    I42.9 Cardiomyopathy (unspecified)  History:        Patient has prior history of Echocardiogram examinations, most                 recent 07/18/2023. Acute MI, Abnormal  ECG, Mitral Valve Disease,                 Arrythmias:Atrial Fibrillation and Cardiac Arrest;                 Signs/Symptoms:Chest Pain. ECMO. Impella device present. S/P                 MTEER X3 XTW clips. Pulmonary embolus. Cardiac shock.                  Mitral Valve: Mitra-Clip valve is present in the mitral                 position.  Sonographer:    Sheralyn Boatman RDCS Referring Phys: [id number] [facility]  Sonographer Comments: Technically difficult study due to poor echo windows and echo performed with patient supine and on artificial respirator. IMPRESSIONS  1. Impella 3.7cm from the aortic valve annulus. Left ventricular ejection fraction, by estimation,  is 35 to 40%. The left ventricle has moderately decreased function.  2. Right ventricular systolic function is mildly reduced. The right ventricular size is mildly enlarged.  3. Left atrial size was mildly dilated.  4. Right atrial size was mildly dilated.  5. The mitral valve has been repaired/replaced. Trivial mitral valve regurgitation. There is a Mitra-Clip present in the mitral position.  6. The inferior vena cava is dilated in size with <50% respiratory variability, suggesting right atrial pressure of 15 mmHg. FINDINGS  Left Ventricle: Impella 3.7cm from the aortic valve annulus. Left ventricular ejection fraction, by estimation, is 35 to 40%. The left ventricle has moderately decreased function. Right Ventricle: The right ventricular size is mildly enlarged. Right ventricular systolic function is mildly reduced. Left Atrium: Left atrial size was mildly dilated. Right Atrium: Right atrial size was mildly dilated. Pericardium: There is Aaron evidence of pericardial effusion. Mitral Valve: The mitral valve has been repaired/replaced. Trivial mitral valve regurgitation. There is a Mitra-Clip present in the mitral position. Venous: The inferior vena cava is dilated in size with less than 50% respiratory variability, suggesting right atrial pressure of 15 mmHg.  Additional Comments: Spectral Doppler performed. Color Doppler performed.  LEFT VENTRICLE PLAX 2D LVIDd:         5.00 cm LVIDs:         4.20 cm LV PW:         1.30 cm LV IVS:        1.70 cm  IVC IVC diam: 3.10 cm Aditya Sabharwal Electronically signed by Dorthula Nettles Signature Date/Time: 07/21/2023/10:51:41 AM    Final    DG Chest Port 1 View Result Date: 07/20/2023 CLINICAL DATA:  74259 ARDS (adult respiratory distress syndrome) (HCC) [id number] EXAM: PORTABLE CHEST 1 VIEW COMPARISON:  July 19, 2023, March seventh 2025 FINDINGS: The cardiomediastinal silhouette is unchanged in contour.Impella catheter. ETT tip terminates 6.4 cm above the carina. RIGHT IJ PA catheter tip terminates over the RIGHT main pulmonary artery proximally. LEFT IJ CVC tip terminates over the superior cavoatrial junction. LEFT subclavian CVC tip terminates over the LEFT brachiocephalic vein. The enteric tube courses through the chest to the abdomen beyond the field-of-view. Trace LEFT pleural effusion. Aaron pneumothorax. Interval marked improvement in aeration in the LEFT lung with mild residual LEFT retrocardiac opacity likely reflecting residual atelectasis. Some patchy reticulonodular opacities persist in the bilateral perihilar areas, nonspecific. Enteric contrast delineates the stomach. IMPRESSION: 1. Support apparatus as described above. 2. Interval marked improvement in aeration in the LEFT lung with mild residual LEFT retrocardiac opacity likely reflecting residual atelectasis. 3. Some patchy reticulonodular opacities persist in the bilateral perihilar areas, nonspecific. Electronically Signed   By: Meda Klinefelter M.D.   On: 07/20/2023 10:16   DG CHEST PORT 1 VIEW Result Date: 07/19/2023 CLINICAL DATA:  Line placement EXAM: PORTABLE CHEST 1 VIEW COMPARISON:  X-ray earlier 07/19/2023 at 9:38 a.m. Older exams as well FINDINGS: Stable ET tube, enteric tube, left IJ line. The Swan-Ganz catheter via the right IJ is more in the right  pulmonary artery. There is a ECMO catheter in place along the cardiac silhouette. Interval placement of a left subclavian line with tip overlying the upper mediastinum. This is not cross to the right side. Exact tip location is uncertain. There is increasing opacification along left hemithorax with increasing fluid. The heart is enlarged. Vascular congestion of the right lung. The right lateral thorax sick edge is clipped off the edge of the film. Aaron right-sided pneumothorax or large  effusion. Critical Value/emergent results were called by telephone at the time of interpretation on 07/19/2023 at 11:21 am to nurse Toni Amend, who verbally acknowledged these results. IMPRESSION: New left subclavian line with tip overlying the upper mediastinum. The exact tip location is uncertain and there is increasing left pleural effusion and lung opacity. There is also some shift of the mediastinum from right to left. Component of volume loss is possible. Please correlate with blood return and additional workup for catheter location. Advancement of the Swan-Ganz catheter now with tip along the right pulmonary artery. Electronically Signed   By: Karen Kays M.D.   On: 07/19/2023 11:46   DG CHEST PORT 1 VIEW Result Date: 07/19/2023 CLINICAL DATA:  Shortness of breath. EXAM: PORTABLE CHEST 1 VIEW COMPARISON:  X-ray 07/19/2023 at 9:36 a.m. FINDINGS: Interval retraction of the Swan-Ganz catheter via the right IJ now with tip along the main pulmonary artery. Stable ET tube, enteric tube, left IJ catheter and ECMO catheter. Enlarged heart with vascular congestion and some edema. Persistent left retrocardiac opacity and effusion. Aaron pneumothorax. Overlapping cardiac leads. IMPRESSION: Interval retraction of the Swan-Ganz catheter with tip overlying the main pulmonary artery. Electronically Signed   By: Karen Kays M.D.   On: 07/19/2023 11:24   DG CHEST PORT 1 VIEW Result Date: 07/19/2023 CLINICAL DATA:  Shortness of breath EXAM:  PORTABLE CHEST 1 VIEW COMPARISON:  X-ray 07/18/2023 and older FINDINGS: Stable ET tube, enteric tube. Right IJ Swan-Ganz catheter with tip in the right lung hilum, somewhat more peripheral than usually seen and advanced from previous. Left IJ line with tip along the central SVC. Separate right subclavian presumed ECMO catheter. Enlarged heart with vascular congestion and trace edema. Confluent left lung base opacity with left effusion. Aaron pneumothorax. Overlapping cardiac leads. Density in the upper abdomen on the left. This could be contrast in the stomach or other process. IMPRESSION: Mass with the Swan-Ganz catheter further into the right lung hilum of the upper lobe. Numerous other tubes and lines again seen. Enlarged heart with vascular congestion and some developing edema. Persistent left lung base opacity with increasing left pleural effusion. Electronically Signed   By: Karen Kays M.D.   On: 07/19/2023 11:23   HYBRID OR IMAGING ([facility] ONLY) Result Date: 07/18/2023 There is Aaron interpretation for this exam.  This order is for images obtained during a surgical procedure.  Please See "Surgeries" Tab for more information regarding the procedure.   ECHOCARDIOGRAM LIMITED Result Date: 07/18/2023    ECHOCARDIOGRAM LIMITED REPORT   Patient Name:   Aaron [name] Date of Exam: 07/18/2023 Medical Rec #:  [id number]    Height:       76.0 in Accession #:    [id number]   Weight:       174.4 lb Date of Birth:  [birth year]    BSA:          2.090 m Patient Age:    37 years     BP:           119/58 mmHg Patient Gender: M            HR:           67 bpm. Exam Location:  Inpatient Procedure: Limited Echo (Both Spectral and Color Flow Doppler were utilized            during procedure). Indications:    ECMO  History:        Patient has prior history of Echocardiogram examinations, most  recent 07/17/2023.  Sonographer:    Karma Ganja Referring Phys: [id number] ADITYA SABHARWAL IMPRESSIONS  1. Impella device noted 5 cm  distal to the aortic annulus Aaron significant change in RV/LV size/functoin with ECMO flow clamped. Left ventricular ejection fraction, by estimation, is 25%. The left ventricle has moderately decreased function. The left ventricle demonstrates global hypokinesis. The left ventricular internal cavity size was moderately dilated.  2. Right ventricular systolic function is moderately reduced. The right ventricular size is moderately enlarged.  3. Post mTEER with 3 XTW clips Aaron color flow/doppler perfomred . The mitral valve has been repaired/replaced. FINDINGS  Left Ventricle: Impella device noted 5 cm distal to the aortic annulus Aaron significant change in RV/LV size/functoin with ECMO flow clamped. Left ventricular ejection fraction, by estimation, is 25%. The left ventricle has moderately decreased function. The left ventricle demonstrates global hypokinesis. The left ventricular internal cavity size was moderately dilated. Right Ventricle: The right ventricular size is moderately enlarged. Right ventricular systolic function is moderately reduced. Pericardium: There is Aaron evidence of pericardial effusion. Mitral Valve: Post mTEER with 3 XTW clips Aaron color flow/doppler perfomred. The mitral valve has been repaired/replaced. LEFT VENTRICLE PLAX 2D LVIDd:         0.09 cm LV IVS:        9 mm  Charlton Haws MD Electronically signed by Charlton Haws MD Signature Date/Time: 07/18/2023/12:50:15 PM    Final    DG CHEST PORT 1 VIEW Result Date: 07/18/2023 CLINICAL DATA:  ECMO. EXAM: PORTABLE CHEST 1 VIEW COMPARISON:  July 17, 2023. FINDINGS: Stable cardiomegaly. Impella device is unchanged in position. Endotracheal nasogastric tubes are unchanged. Right internal jugular Swan-Ganz catheter is unchanged. Right lung is clear. Mild left basilar atelectasis is noted with possible effusion, although left lung base is not completely included in field-of-view. IMPRESSION: Stable support apparatus. Probable mild left basilar atelectasis  is noted with small pleural effusion, although left lung base is not completely included in field-of-view. Electronically Signed   By: Lupita Raider M.D.   On: 07/18/2023 09:59   ECHOCARDIOGRAM COMPLETE Result Date: 07/17/2023    ECHOCARDIOGRAM REPORT   Patient Name:   Aaron [name] Date of Exam: 07/17/2023 Medical Rec #:  [id number]    Height:       76.0 in Accession #:    [id number]   Weight:       172.6 lb Date of Birth:  03-05-1986    BSA:          2.081 m Patient Age:    37 years     BP:           101/68 mmHg Patient Gender: M            HR:           66 bpm. Exam Location:  Inpatient Procedure: 2D Echo, Cardiac Doppler, Color Doppler and Intracardiac            Opacification Agent (Both Spectral and Color Flow Doppler were            utilized during procedure). Indications:    S/P mitral valve repair  History:        Patient has prior history of Echocardiogram examinations, most                 recent 03/20/2023. Arrythmias:Atrial Flutter,                 Signs/Symptoms:Chest Pain; Risk Factors:Hypertension. Cardiac  arrest, cardiogenic shock.  Sonographer:    Vern Claude Referring Phys: [id number] KATHRYN R THOMPSON IMPRESSIONS  1. Impella Canula appears reasonably positioned. ECMO support not well assessed in this study. Left ventricular ejection fraction, by estimation, is <20%. The left ventricle has severely decreased function. The left ventricle demonstrates global hypokinesis. There is mild left ventricular hypertrophy. Left ventricular diastolic parameters are indeterminate.  2. TAPSE is now normal, Basal function has improved. Right ventricular systolic function is moderately reduced. The right ventricular size is severely enlarged.  3. Large pleural effusion.  4. There are three MitraClip XTWs placed centrally. Trivial regurgitation. Mean gradient 1 mm Hg. Aaron SLD. The mitral valve has been repaired/replaced. Trivial mitral valve regurgitation. Aaron evidence of mitral stenosis. The mean  mitral valve gradient is 1.0 mmHg.  5. The aortic valve is tricuspid. Aortic valve regurgitation is not visualized. Aaron aortic stenosis is present.  6. The inferior vena cava is normal in size with greater than 50% respiratory variability, suggesting right atrial pressure of 3 mmHg.  7. Evidence of atrial level shunting detected by color flow Doppler. Comparison(s): Prior images reviewed side by side. LV is less dilated. Successful mTEER. RV function has improved from prior imaging. FINDINGS  Left Ventricle: Impella Canula appears reasonably positioned. ECMO support not well assessed in this study. Left ventricular ejection fraction, by estimation, is <20%. The left ventricle has severely decreased function. The left ventricle demonstrates global hypokinesis. Strain was performed and the global longitudinal strain is indeterminate. The left ventricular internal cavity size was normal in size. There is mild left ventricular hypertrophy. Left ventricular diastolic parameters are indeterminate. Right Ventricle: TAPSE is now normal, Basal function has improved. The right ventricular size is severely enlarged. Aaron increase in right ventricular wall thickness. Right ventricular systolic function is moderately reduced. Left Atrium: Left atrial size was normal in size. Right Atrium: Right atrial size was normal in size. Pericardium: There is Aaron evidence of pericardial effusion. Mitral Valve: There are three MitraClip XTWs placed centrally. Trivial regurgitation. Mean gradient 1 mm Hg. Aaron SLD. The mitral valve has been repaired/replaced. Trivial mitral valve regurgitation. Aaron evidence of mitral valve stenosis. MV peak gradient, 2.5 mmHg. The mean mitral valve gradient is 1.0 mmHg. Tricuspid Valve: The tricuspid valve is normal in structure. Tricuspid valve regurgitation is trivial. Aaron evidence of tricuspid stenosis. Aortic Valve: The aortic valve is tricuspid. Aortic valve regurgitation is not visualized. Aaron aortic stenosis is  present. Aortic valve mean gradient measures 0.0 mmHg. Aortic valve peak gradient measures 1.1 mmHg. Aortic valve area, by VTI measures 3.15 cm. Pulmonic Valve: The pulmonic valve was normal in structure. Pulmonic valve regurgitation is not visualized. Aaron evidence of pulmonic stenosis. Aorta: The aortic root and ascending aorta are structurally normal, with Aaron evidence of dilitation. Venous: The inferior vena cava is normal in size with greater than 50% respiratory variability, suggesting right atrial pressure of 3 mmHg. IAS/Shunts: Evidence of atrial level shunting detected by color flow Doppler. Additional Comments: 3D was performed not requiring image post processing on an independent workstation and was indeterminate. There is a large pleural effusion.  LEFT VENTRICLE PLAX 2D LVIDd:         4.30 cm      Diastology LVIDs:         3.80 cm      LV e' medial:    6.83 cm/s LV PW:         1.20 cm      LV E/e' medial:  8.5 LV IVS:        1.10 cm      LV e' lateral:   9.75 cm/s LVOT diam:     2.20 cm      LV E/e' lateral: 5.9 LV SV:         24 LV SV Index:   11 LVOT Area:     3.80 cm  LV Volumes (MOD) LV vol d, MOD A4C: 163.0 ml LV vol s, MOD A4C: 111.0 ml LV SV MOD A4C:     163.0 ml RIGHT VENTRICLE            IVC RV Basal diam:  5.10 cm    IVC diam: 1.40 cm RV Mid diam:    3.90 cm RV S prime:     6.76 cm/s TAPSE (M-mode): 1.5 cm LEFT ATRIUM           Index        RIGHT ATRIUM           Index LA diam:      2.70 cm 1.30 cm/m   RA Area:     17.60 cm LA Vol (A2C): 53.5 ml 25.71 ml/m  RA Volume:   51.20 ml  24.61 ml/m LA Vol (A4C): 22.2 ml 10.67 ml/m  AORTIC VALVE                    PULMONIC VALVE AV Area (Vmax):    3.46 cm     PV Vmax:       0.61 m/s AV Area (Vmean):   3.41 cm     PV Peak grad:  1.5 mmHg AV Area (VTI):     3.15 cm AV Vmax:           53.50 cm/s AV Vmean:          28.300 cm/s AV VTI:            0.075 m AV Peak Grad:      1.1 mmHg AV Mean Grad:      0.0 mmHg LVOT Vmax:         48.70 cm/s LVOT Vmean:         25.400 cm/s LVOT VTI:          0.062 m LVOT/AV VTI ratio: 0.83  AORTA Ao Root diam: 3.50 cm Ao Asc diam:  3.00 cm MITRAL VALVE MV Area (PHT): 1.99 cm    SHUNTS MV Area VTI:   0.90 cm    Systemic VTI:  0.06 m MV Peak grad:  2.5 mmHg    Systemic Diam: 2.20 cm MV Mean grad:  1.0 mmHg MV Vmax:       0.79 m/s MV Vmean:      55.6 cm/s MV Decel Time: 382 msec MV E velocity: 57.90 cm/s MV A velocity: 91.20 cm/s MV E/A ratio:  0.63 Riley Lam MD Electronically signed by Riley Lam MD Signature Date/Time: 07/17/2023/10:52:00 AM    Final    DG Abd 1 View Result Date: 07/17/2023 CLINICAL DATA:  OG tube placement EXAM: ABDOMEN - 1 VIEW COMPARISON:  07/16/2023, 07/15/2023, 07/13/2023 FINDINGS: Enteric tube tip and side port overlie the proximal stomach. ECMO catheter tip overlies the intra hepatic IVC. Radiopaque material overlying left upper quadrant is unchanged. IMPRESSION: 1. Enteric tube tip and side port overlie the proximal stomach. 2. Persistent left retrocardiac opacity Electronically Signed   By: Jasmine Pang M.D.   On: 07/17/2023 01:37   DG CHEST PORT 1 VIEW  Result Date: 07/17/2023 CLINICAL DATA:  OG tube placement EXAM: PORTABLE CHEST 1 VIEW COMPARISON:  07/16/2023 FINDINGS: Endotracheal tube tip is about 4.4 cm superior to the carina. Left IJ central venous catheter tip at the cavoatrial junction. Right IJ Swan-Ganz catheter tip at the right pulmonary artery. Impella device similar in position. Enteric tube tip below the diaphragm but incompletely visualized. Cardiomegaly. Persistent consolidation at the left lung base. IMPRESSION: 1. Support lines and tubes as above. 2. Cardiomegaly with persistent airspace duct stones at the left base. Electronically Signed   By: Jasmine Pang M.D.   On: 07/17/2023 01:35   DG Abd 1 View Result Date: 07/16/2023 CLINICAL DATA:  Orogastric tube placement. EXAM: ABDOMEN - 1 VIEW COMPARISON:  None Available. FINDINGS: Tip of the enteric tube below the  diaphragm in the stomach, the side port is at the level of the gastroesophageal junction. Advancement of least 4 cm is recommended to place the side-port below the diaphragm. ECMO catheter from an inferior approach. Swan-Ganz catheter and intra-aortic balloon pump partially included in the field of view. Retrocardiac opacity again seen. IMPRESSION: Tip of the enteric tube below the diaphragm in the stomach, the side port is at the level of the gastroesophageal junction. Advancement of at least 4 cm is recommended to place the side-port below the diaphragm. Electronically Signed   By: Narda Rutherford M.D.   On: 07/16/2023 22:18   ECHO TEE Result Date: 07/16/2023    TRANSESOPHOGEAL ECHO REPORT   Patient Name:   WESTON KALLMAN Date of Exam: 07/16/2023 Medical Rec #:  [id number]    Height:       76.0 in Accession #:    [id number]   Weight:       176.6 lb Date of Birth:  1985-11-09    BSA:          2.101 m Patient Age:    37 years     BP:           93/82 mmHg Patient Gender: M            HR:           61 bpm. Exam Location:  Inpatient Procedure: Transesophageal Echo, 3D Echo, Cardiac Doppler and Color Doppler            (Both Spectral and Color Flow Doppler were utilized during            procedure). Indications:     MitraClip  History:         Patient has prior history of Echocardiogram examinations, most                  recent 07/14/2023. CHF, Acute MI; Mitral Valve Disease.  Sonographer:     Darlys Gales Referring Phys:  [id number] Wille Celeste THOMPSON Diagnosing Phys: Riley Lam MD PROCEDURE: After discussion of the risks and benefits of a TEE, an informed consent was obtained. The transesophogeal probe was passed without difficulty through the esophogus of the patient. Sedation performed by different physician. The patient developed Aaron complications during the procedure.  IMPRESSIONS  1. ECMO and Impella support cannulas present. Settings change throughout the procedure for optimal loading. LV decreased in size  from start to end of case. Left ventricular ejection fraction, by estimation, is <20%. The left ventricle has severely decreased function. The left ventricular internal cavity size was severely dilated.  2. Right ventricular systolic function is severely reduced. The right ventricular size is moderately enlarged.  3.  Left atrial size was severely dilated. Aaron left atrial/left atrial appendage thrombus was detected.  4. Right atrial size was mild to moderately dilated.  5. Moderate pericardial effusion. The pericardial effusion is posterior to the left ventricle.  6. Prior to procedure- severe mitral valve regurgitation. 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 PV flow reversal. Mean gradient 1 mm Hg with Aaron evidence of mitral stenosis.     During the procedure, three XTW Mitra Clips were placed. First A-2/P2, second lateral to this (slightly unparallel due to being stuck in the P2 apparatus, third lateral to this. Mitral regurgitation become moderate, mild, then trivial.     After procedure, a mean gradient of 2 mm HG was present. Slight increase in pericardial effusion size without any evidence of tamponade. Residual shunting all left to right. Patient tolerated the procedure well with Aaron immediate complications. The mitral valve is abnormal. Trivial mitral valve regurgitation. Aaron evidence of mitral stenosis.  7. The aortic valve is tricuspid. Aortic valve regurgitation is trivial. Aaron aortic stenosis is present.  8. 3D performed of the mitral valve and demonstrates 3D MPR used to line up 3 MitraClips.  9. Evidence of atrial level shunting detected by color flow Doppler. FINDINGS  Left Ventricle: ECMO and Impella support cannulas present. Settings change throughout the procedure for optimal loading. LV decreased in size from start to end of case. Left ventricular ejection fraction, by estimation, is <20%. The left ventricle has severely decreased  function. The left ventricular internal cavity size was severely dilated. Right Ventricle: The right ventricular size is moderately enlarged. Aaron increase in right ventricular wall thickness. Right ventricular systolic function is severely reduced. Left Atrium: Left atrial size was severely dilated. Aaron left atrial/left atrial appendage thrombus was detected. Right Atrium: Right atrial size was mild to moderately dilated. Pericardium: A moderately sized pericardial effusion is present. The pericardial effusion is posterior to the left ventricle. Mitral Valve: Prior to procedure- severe mitral valve regurgitation. 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 PV flow reversal. Mean gradient 1 mm Hg with Aaron evidence of mitral stenosis. During the procedure, three XTW Mitra Clips were placed. First A-2/P2, second lateral to this (slightly unparallel due to being stuck in the P2 apparatus, third lateral to this. Mitral regurgitation become moderate, mild, then trivial. After procedure, a mean gradient of 2 mm HG was present. Slight increase in pericardial effusion size without any evidence of tamponade. Residual shunting all left to right. Patient tolerated the procedure well with Aaron immediate complications. The mitral  valve is abnormal. Trivial mitral valve regurgitation. Aaron evidence of mitral valve stenosis. MV peak gradient, 3.4 mmHg. The mean mitral valve gradient is 1.6 mmHg. Tricuspid Valve: The tricuspid valve is normal in structure. Tricuspid valve regurgitation is mild . Aaron evidence of tricuspid stenosis. Aortic Valve: The aortic valve is tricuspid. Aortic valve regurgitation is trivial. Aaron aortic stenosis is present. Pulmonic Valve: The pulmonic valve was normal in structure. Pulmonic valve regurgitation is trivial. Aorta: The aortic root, ascending aorta, aortic arch and descending aorta are all structurally normal, with Aaron evidence of dilitation  or obstruction. IAS/Shunts: Evidence of atrial level shunting detected by color flow Doppler. Additional Comments: 3D was performed not requiring image post processing on an independent workstation and was indeterminate. MITRAL VALVE MV Peak grad: 3.4 mmHg MV Mean grad: 1.6 mmHg MV Vmax:      0.93 m/s  MV Vmean:     59.5 cm/s Riley Lam MD Electronically signed by Riley Lam MD Signature Date/Time: 07/16/2023/4:05:02 PM    Final    EP STUDY Result Date: 07/16/2023 See surgical note for result.  Structural Heart Procedure Result Date: 07/16/2023 See surgical note for result.  DG CHEST PORT 1 VIEW Result Date: 07/16/2023 CLINICAL DATA:  ECMO.  Bilateral pulmonary embolism. EXAM: PORTABLE CHEST 1 VIEW COMPARISON:  One-view chest x-ray 07/15/2023. FINDINGS: The heart is mildly enlarged. Endotracheal tube is stable. Left IJ line is stable. Swan-Ganz catheter is stable terminating at the right main pulmonary outflow tract. Impella device is stable. ECMO catheter is present in the inferior IVC. Mild pulmonary vascular congestion is stable. A left pleural effusion is suspected. IMPRESSION: 1. Stable support apparatus. 2. Stable mild cardiomegaly and pulmonary vascular congestion. 3. Suspect left pleural effusion. Electronically Signed   By: Marin Roberts M.D.   On: 07/16/2023 09:48   DG Chest 1 View Result Date: 07/15/2023 CLINICAL DATA:  ECMO. EXAM: CHEST  1 VIEW portable COMPARISON:  07/14/2023 FINDINGS: Stable ET tube, enteric tube. Left IJ catheter tip along the SVC right atrial junction region. Right IJ Swan-Ganz catheter with tip overlying the right pulmonary artery. Separate ECMO catheter extending overlying the cardiac shadow. The heart is enlarged. There appear to be enlarged pulmonary arteries as well with vascular congestion. Persistent left retrocardiac opacity. Decreasing right lung base opacity. Aaron pneumothorax. Overlapping cardiac leads. IMPRESSION: Numerous tubes and lines are  stable. Decreasing right lung base opacity. Electronically Signed   By: Karen Kays M.D.   On: 07/15/2023 10:12   DG CHEST PORT 1 VIEW Result Date: 07/14/2023 CLINICAL DATA:  ECMO chest pain EXAM: PORTABLE CHEST 1 VIEW COMPARISON:  07/14/2023, CT 07/10/2023, chest x-ray 07/13/2023, 07/12/2023, 07/11/2023 FINDINGS: Endotracheal tube tip is about 4.6 cm superior to the carina. Enteric tube tip below the diaphragm but incompletely visualized. Right IJ Swan-Ganz catheter tip over the right pulmonary artery. Left IJ central venous catheter tip at the SVC. Left ventricular assist device with the tip overlying the left ventricular region. Cardiac enlargement as before. Partially visualized ECMO catheter tip overlying the intra hepatic IVC, about 2.7 cm caudal to the right atrial IVC junction. Persistent left lung base consolidation. Vascular congestion and hazy right base airspace disease IMPRESSION: 1. Support lines and tubes as above 2. Cardiomegaly with vascular congestion and probable pleural effusions. Aaron significant change in dense left lung base consolidation and hazy airspace disease at the right base. Electronically Signed   By: Jasmine Pang M.D.   On: 07/14/2023 20:19   DG C-Arm 1-60 Min-Aaron Report Result Date: 07/14/2023 Fluoroscopy was utilized by the requesting physician.  Aaron radiographic interpretation.   DG C-Arm 1-60 Min-Aaron Report Result Date: 07/14/2023 Fluoroscopy was utilized by the requesting physician.  Aaron radiographic interpretation.   DG CHEST PORT 1 VIEW Result Date: 07/14/2023 CLINICAL DATA:  Cardiogenic shock.  On ECMO. EXAM: PORTABLE CHEST 1 VIEW COMPARISON:  Chest x-ray from yesterday. FINDINGS: Unchanged endotracheal tube, enteric tube, right internal jugular Swan-Ganz catheter, left internal jugular central venous catheter, and Impella device. Unchanged ECMO cannula near the inferior cavoatrial junction. Stable cardiomegaly. Hazy airspace opacities throughout the left lung are not  significantly changed. Similar left-greater-than-right lower lobe atelectasis. Aaron pneumothorax. Aaron acute osseous abnormality. IMPRESSION: 1. Unchanged support apparatus. 2. Unchanged asymmetric pulmonary edema and left-greater-than-right lower lobe atelectasis. Electronically Signed   By: Obie Dredge M.D.   On: 07/14/2023 09:13   DG  CHEST PORT 1 VIEW Result Date: 07/13/2023 CLINICAL DATA:  Patient receiving ECMO. EXAM: PORTABLE CHEST 1 VIEW COMPARISON:  Chest radiograph from the prior day. FINDINGS: An endotracheal tube terminates in the midthoracic trachea. An Impella device appears unchanged in position. A right internal jugular swans Ganz catheter tip overlies the right pulmonary artery. A left internal jugular central venous catheter tip overlies the superior vena cava. A vascular catheter overlies the inferior cavoatrial junction. An enteric tube enters the stomach and terminates below the field of view. The heart is enlarged. Bilateral layering pleural effusions with associated atelectasis/airspace disease appears similar to prior exam. IMPRESSION: Bilateral layering pleural effusions with associated atelectasis/airspace disease appears similar to prior exam. Electronically Signed   By: Romona Curls M.D.   On: 07/13/2023 11:32   DG CHEST PORT 1 VIEW Result Date: 07/12/2023 CLINICAL DATA:  Check endotracheal tube placement EXAM: PORTABLE CHEST 1 VIEW COMPARISON:  Film from earlier in the same day. FINDINGS: Endotracheal tube and gastric catheter are noted in satisfactory position. Impella catheter is seen extending into the left ventricle. Swan-Ganz catheter is noted within the right pulmonary artery. Left jugular central venous line is noted at the cavoatrial junction. Aaron pneumothorax is seen. Persistent bibasilar opacities are seen. Aaron other focal abnormality is noted. IMPRESSION: Tubes and lines in satisfactory position. Bibasilar opacities are again seen stable from the recent exam. Electronically  Signed   By: Alcide Clever M.D.   On: 07/12/2023 19:25   DG CHEST PORT 1 VIEW Result Date: 07/12/2023 CLINICAL DATA:  ECMO patient.  History of pulmonary embolism. EXAM: PORTABLE CHEST 1 VIEW COMPARISON:  Radiographs 07/11/2023 and 07/10/2023.  CT 07/10/2023. FINDINGS: 0542 hours. Two views submitted. Unchanged position of the support system. Tip of the endotracheal tube overlies the mid trachea. Right IJ Swan-Ganz catheter projects over the proximal right pulmonary artery. Enteric tube projects below the diaphragm, tip not visualized. Left IJ central venous catheter extends to the level of the superior cavoatrial junction. The Impella device and ECMO catheter appear unchanged. The heart size and mediastinal contours are stable. The overall pulmonary aeration has improved with residual left greater than right basilar airspace opacities and probable small pleural effusions. Aaron evidence of pneumothorax. The bones appear unchanged. IMPRESSION: 1. Improved pulmonary aeration with residual left greater than right basilar airspace opacities and probable small pleural effusions. 2. Stable support system. Electronically Signed   By: Carey Bullocks M.D.   On: 07/12/2023 09:49   VAS [place] LOWER EXTREMITY ARTERIAL DUPLEX Result Date: 07/11/2023 LOWER EXTREMITY ARTERIAL DUPLEX STUDY Patient Name:  JAMIRE SHABAZZ  Date of Exam:   07/11/2023 Medical Rec #: [id number]     Accession #:    [id number] Date of Birth: 1985-09-15     Patient Gender: M Patient Age:   72 years Exam Location:  [facility] Procedure:      VAS [place] LOWER EXTREMITY ARTERIAL DUPLEX Referring Phys: Clearnce Hasten --------------------------------------------------------------------------------  Indications: Peripheral artery disease.  Vascular Interventions: ECMO cannulation 07/10/23. Current ABI:            N/A Limitations: ECMO Comparison Study: None. Performing Technologist: Shona Simpson  Examination Guidelines: A complete evaluation includes B-mode  imaging, spectral Doppler, color Doppler, and power Doppler as needed of all accessible portions of each vessel. Bilateral testing is considered an integral part of a complete examination. Limited examinations for reoccurring indications may be performed as noted.  +----------+--------+-----+--------+----------+--------+ RIGHT     PSV cm/sRatioStenosisWaveform  Comments +----------+--------+-----+--------+----------+--------+ DFA  85                   monophasic         +----------+--------+-----+--------+----------+--------+ SFA Prox  48                   monophasic         +----------+--------+-----+--------+----------+--------+ SFA Mid   73                   monophasic         +----------+--------+-----+--------+----------+--------+ SFA Distal44                   monophasic         +----------+--------+-----+--------+----------+--------+ POP Prox  51                   monophasic         +----------+--------+-----+--------+----------+--------+ POP Distal40                   monophasic         +----------+--------+-----+--------+----------+--------+ ATA Distal25                   monophasic         +----------+--------+-----+--------+----------+--------+ PTA Distal24                   monophasic         +----------+--------+-----+--------+----------+--------+ PERO Mid  31                   monophasic         +----------+--------+-----+--------+----------+--------+  +----------+--------+-----+--------+----------+--------------+ LEFT      PSV cm/sRatioStenosisWaveform  Comments       +----------+--------+-----+--------+----------+--------------+ DFA                                      Not Visualized +----------+--------+-----+--------+----------+--------------+ SFA Prox  35                   monophasic               +----------+--------+-----+--------+----------+--------------+ SFA Mid   20                    monophasic               +----------+--------+-----+--------+----------+--------------+ SFA Distal20                   monophasic               +----------+--------+-----+--------+----------+--------------+ POP Prox  13                   monophasic               +----------+--------+-----+--------+----------+--------------+ POP Distal14                   monophasic               +----------+--------+-----+--------+----------+--------------+ ATA Distal11                   monophasic               +----------+--------+-----+--------+----------+--------------+ PTA Distal6                    monophasic               +----------+--------+-----+--------+----------+--------------+  Summary: See table(s) above for measurements and observations. Electronically signed by Coral Else MD on 07/11/2023 at 8:44:22 PM.    Final    VAS [place] LOWER EXTREMITY VENOUS (DVT) Result Date: 07/11/2023  Lower Venous DVT Study Patient Name:  Aaron [name]  Date of Exam:   07/11/2023 Medical Rec #: [id number]     Accession #:    [id number] Date of Birth: 1986-01-17     Patient Gender: M Patient Age:   1 years Exam Location:  [facility] Procedure:      VAS [place] LOWER EXTREMITY VENOUS (DVT) Referring Phys: Clearnce Hasten --------------------------------------------------------------------------------  Indications: Pulmonary embolism.  Risk Factors: Confirmed PE Surgery ECMO cannulation 07/10/23. Limitations: Patient positioning & ECMO. Comparison Study: None. Performing Technologist: Shona Simpson  Examination Guidelines: A complete evaluation includes B-mode imaging, spectral Doppler, color Doppler, and power Doppler as needed of all accessible portions of each vessel. Bilateral testing is considered an integral part of a complete examination. Limited examinations for reoccurring indications may be performed as noted. The reflux portion of the exam is performed with the patient in reverse  Trendelenburg.  +---------+---------------+---------+-----------+----------+--------------+ RIGHT    CompressibilityPhasicitySpontaneityPropertiesThrombus Aging +---------+---------------+---------+-----------+----------+--------------+ CFV                                                   Not Visualized +---------+---------------+---------+-----------+----------+--------------+ SFJ                                                   Not Visualized +---------+---------------+---------+-----------+----------+--------------+ FV Prox  Full                                                        +---------+---------------+---------+-----------+----------+--------------+ FV Mid   Full                                                        +---------+---------------+---------+-----------+----------+--------------+ FV DistalFull                    Yes                                 +---------+---------------+---------+-----------+----------+--------------+ PFV      Full                                                        +---------+---------------+---------+-----------+----------+--------------+ POP      Full           Yes      Yes                                 +---------+---------------+---------+-----------+----------+--------------+  PTV      Full                                                        +---------+---------------+---------+-----------+----------+--------------+ PERO     Full                                                        +---------+---------------+---------+-----------+----------+--------------+   +---------+---------------+---------+-----------+----------+--------------+ LEFT     CompressibilityPhasicitySpontaneityPropertiesThrombus Aging +---------+---------------+---------+-----------+----------+--------------+ CFV                                                   Not Visualized  +---------+---------------+---------+-----------+----------+--------------+ SFJ                                                   Not Visualized +---------+---------------+---------+-----------+----------+--------------+ FV Prox  Full                                                        +---------+---------------+---------+-----------+----------+--------------+ FV Mid   Full                                                        +---------+---------------+---------+-----------+----------+--------------+ FV DistalFull                    Yes                                 +---------+---------------+---------+-----------+----------+--------------+ PFV      Full                                                        +---------+---------------+---------+-----------+----------+--------------+ POP      Full           Yes      Yes                                 +---------+---------------+---------+-----------+----------+--------------+ PTV      Full                                                        +---------+---------------+---------+-----------+----------+--------------+  PERO                                                  Not Visualized +---------+---------------+---------+-----------+----------+--------------+     Summary: BILATERAL: - Aaron evidence of deep vein thrombosis seen in the lower extremities, bilaterally. -Aaron evidence of popliteal cyst, bilaterally.   *See table(s) above for measurements and observations. Electronically signed by Coral Else MD on 07/11/2023 at 8:40:51 PM.    Final    DG CHEST PORT 1 VIEW Result Date: 07/11/2023 CLINICAL DATA:  ECMO. EXAM: PORTABLE CHEST 1 VIEW COMPARISON:  Chest radiograph dated 07/11/2023. FINDINGS: Support line and tube in similar position. Interval lumen of an area of airspace opacity in the right infrahilar region, likely atelectasis. Pneumonia is not excluded. Aaron large pleural effusion. Aaron pneumothorax.  Stable cardiac silhouette Aaron acute osseous pathology. IMPRESSION: 1. Support line and tube in similar position. 2. Right infrahilar atelectasis. Electronically Signed   By: Elgie Collard M.D.   On: 07/11/2023 17:53   DG Chest Port 1 View in am Result Date: 07/11/2023 CLINICAL DATA:  [id number] On mechanically assisted ventilation [facility]) [id number] EXAM: PORTABLE CHEST 1 VIEW COMPARISON:  07/10/2023 FINDINGS: Endotracheal tube, enteric tube, left IJ central line, right IJ pulmonary arterial catheter, and Impella device all remain in stable positioning compared to the previous exam. Stable cardiomegaly. Persistent retrocardiac opacity. Mild right basilar atelectasis. Hazy opacity in the left upper lobe. Aaron pleural effusion or pneumothorax. IMPRESSION: 1. Stable support apparatus. 2. Similar bilateral airspace opacities including retrocardiac opacity and hazy opacity in the left upper lobe. Electronically Signed   By: Duanne Guess D.O.   On: 07/11/2023 10:44   EEG adult Result Date: 07/11/2023 Charlsie Quest, MD     07/11/2023 10:25 AM Patient Name: Aaron [name] MRN: [id number] Epilepsy Attending: Charlsie Quest Referring Physician/Provider: Romie Minus, MD Date: 07/11/2023 Duration: 21.38 mins Patient history: 38yo M s/p cardia arrest. EEG to evaluate for seizure Level of alertness: comatose/ lethargic AEDs during EEG study: Versed Technical aspects: This EEG study was done with scalp electrodes positioned according to the 10-20 International system of electrode placement. Electrical activity was reviewed with band pass filter of 1-70Hz , sensitivity of 7 uV/mm, display speed of 52mm/sec with a 60Hz  notched filter applied as appropriate. EEG data were recorded continuously and digitally stored.  Video monitoring was available and reviewed as appropriate. Description: EEG showed continuous generalized 3 to 6 Hz theta-delta slowing admixed with 15 to 18 Hz beta activity distributed symmetrically and  diffusely. Hyperventilation and photic stimulation were not performed.   ABNORMALITY - Continuous slow, generalized IMPRESSION: This study is suggestive of moderate diffuse encephalopathy likely related to sedation. Aaron seizures or epileptiform discharges were seen throughout the recording. Priyanka Annabelle Harman   CT CHEST ABDOMEN PELVIS WO CONTRAST Result Date: 07/11/2023 CLINICAL DATA:  Patient on ECMO. Seizure. New onset. Aaron history of trauma. Rule out bleeding after TNK. EXAM: CT CHEST, ABDOMEN AND PELVIS WITHOUT CONTRAST TECHNIQUE: Multidetector CT imaging of the chest, abdomen and pelvis was performed following the standard protocol without IV contrast. RADIATION DOSE REDUCTION: This exam was performed according to the departmental dose-optimization program which includes automated exposure control, adjustment of the mA and/or kV according to patient size and/or use of iterative reconstruction technique. COMPARISON:  CT 10/10/2008 and CTA chest 07/09/2023 FINDINGS: CT CHEST  FINDINGS Cardiovascular: Cardiomegaly. Small pericardial effusion. Left IJ CVC tip in the mid SVC. Right IJ Swan-Ganz catheter tip in the distal right pulmonary artery. ECMO cannula in the left ventricle and IVC. Mediastinum/Nodes: Endotracheal tube tip in the intrathoracic trachea. Enteric tube tip in the stomach. Hilar and mediastinal adenopathy was better evaluated on CT chest with contrast 07/09/2023. Lungs/Pleura: Near-complete consolidation within the left lower lobe with volume loss. The left lower lobe bronchus is occluded with debris. Consolidation and ground-glass opacities in the right lower lobe. Patchy bilateral ground-glass opacities greatest in the left upper lobe. These findings are significantly increased compared to 07/09/2023. Aaron pleural effusion or pneumothorax. Musculoskeletal: Aaron acute fracture. CT ABDOMEN PELVIS FINDINGS Hepatobiliary: Unremarkable noncontrast appearance of the liver, gallbladder, and biliary tree.  Pancreas: Mild haziness in the peripancreatic fat. Aaron ductal dilation. Aaron organized fluid collection. Spleen: Unremarkable. Adrenals/Urinary Tract: Normal adrenal glands. Nonspecific bilateral perinephric fluid/stranding, slightly greater on the right. Aaron urinary calculi or hydronephrosis. Foley catheter in the nondistended bladder. Gas in the anti dependent bladder. Stomach/Bowel: Wall thickening about the duodenum with Peri duodenal fluid and stranding. Decompressed transverse, descending, and sigmoid colon. Aaron bowel obstruction. Normal appendix. Vascular/Lymphatic: Lines and tubes are described above. Aaron lymphadenopathy. Reproductive: Aaron acute abnormality. Other: Small volume free fluid in the pelvis and about the liver and spleen. Aaron organized fluid collection or abscess. Aaron free intraperitoneal air. Musculoskeletal: Aaron acute fracture. IMPRESSION: 1. Significant increase in patchy ground-glass and consolidative opacities compared with 07/09/2023. This is nonspecific but given clinical concern this could represent alveolar hemorrhage. Differential considerations include aspiration and multifocal pneumonia. 2. Postobstructive atelectasis/pneumonia secondary to occlusion of the left lower lobe bronchus. 3. Wall thickening of the duodenum. Stranding and fluid about the duodenum and pancreas. It is unclear if this represents duodenitis or pancreatitis with reactive duodenitis. Correlate with lipase. 4. Nonspecific bilateral perinephric fluid/stranding, slightly greater on the right. Correlate with urinalysis to exclude infection. 5. Small volume free fluid in the abdomen and pelvis. Aaron organized fluid collection or abscess. 6. Lines and tubes as described. Electronically Signed   By: Minerva Fester M.D.   On: 07/11/2023 02:14   CT HEAD WO CONTRAST ( ) Result Date: 07/11/2023 CLINICAL DATA:  , New onset seizure. Aaron history of trauma. Rule out bleeding. EXAM: CT HEAD WITHOUT CONTRAST TECHNIQUE: Contiguous axial  images were obtained from the base of the skull through the vertex without intravenous contrast. RADIATION DOSE REDUCTION: This exam was performed according to the departmental dose-optimization program which includes automated exposure control, adjustment of the mA and/or kV according to patient size and/or use of iterative reconstruction technique. COMPARISON:  CT head 08/16/2016 FINDINGS: Brain: Aaron intracranial hemorrhage, mass effect, or evidence of acute infarct. Aaron hydrocephalus. Aaron extra-axial fluid collection. Vascular: Aaron hyperdense vessel or unexpected calcification. Skull: Aaron fracture or focal lesion. Sinuses/Orbits: Mucosal thickening in the paranasal sinuses. Aaron mastoid effusion. Other: Partially visualized enteric and endotracheal tubes. IMPRESSION: Aaron acute intracranial abnormality. Electronically Signed   By: Minerva Fester M.D.   On: 07/11/2023 01:54   DG CHEST PORT 1 VIEW Result Date: 07/10/2023 CLINICAL DATA:  Patient receiving ECMO. EXAM: PORTABLE CHEST 1 VIEW COMPARISON:  07/10/2023 FINDINGS: Endotracheal tube with tip measuring 5.6 cm above the carina. Left central venous catheter, right Swan-Ganz catheter, and cardiac balloon pump are unchanged in position. Cardiac enlargement. Small left pleural effusion. Basilar atelectasis. Right lung is clear. IMPRESSION: Appliances remain unchanged in position. Small left pleural effusion with basilar atelectasis. Electronically Signed  By: Burman Nieves M.D.   On: 07/10/2023 22:21   DG Chest Port 1 View Result Date: 07/10/2023 CLINICAL DATA:  Mechanically assisted ventilation EXAM: PORTABLE CHEST 1 VIEW COMPARISON:  07/09/2023 FINDINGS: An endotracheal tube has been placed with tip measuring 5.9 cm above the carina. Left central venous catheter with tip projecting over the cavoatrial junction region. Right Swan-Ganz catheter with tip projecting over the right pulmonary artery. Cardiac balloon catheter tip projecting over the left ventricle.  Cardiac enlargement. Probable small left pleural effusion. Lungs are grossly clear. Aaron pneumothorax. IMPRESSION: Appliances appear in satisfactory position. Cardiac enlargement. Lungs are clear. Probable small left pleural effusion. Electronically Signed   By: Burman Nieves M.D.   On: 07/10/2023 19:36   ECHO TEE Result Date: 07/10/2023    TRANSESOPHOGEAL ECHO REPORT   Patient Name:   KINNICK MAUS Date of Exam: 07/10/2023 Medical Rec #:  [id number]    Height:       76.0 in Accession #:    [id number]   Weight:       215.0 lb Date of Birth:  [birth year]    BSA:          2.284 m Patient Age:    37 years     BP:           0/0 mmHg Patient Gender: M            HR:           71 bpm. Exam Location:  Inpatient Procedure: Transesophageal Echo, Cardiac Doppler, Color Doppler and 3D Echo            (Both Spectral and Color Flow Doppler were utilized during            procedure). Indications:     Impella placement and ECMO  History:         Patient has prior history of Echocardiogram examinations, most                  recent 03/20/2023. Arrythmias:Cardiac Arrest;                  Signs/Symptoms:Chest Pain and Shortness of Breath.  Sonographer:     Delcie Roch RDCS Referring Phys:  [id number] Floreen Comber PATEL Diagnosing Phys: Arvilla Meres MD PROCEDURE: After discussion of the risks and benefits of a TEE, an informed consent was obtained from the patient. The patient was intubated. The transesophogeal probe was passed without difficulty through the esophogus of the patient. Imaged were obtained with the patient in a supine position. Sedation performed by different physician. The patient was monitored while under deep sedation. The patient developed Aaron complications during the procedure.  IMPRESSIONS  1. Post ECMO images with significant decrease in biventricular function compared to pre-ECMO TTE. Left ventricular ejection fraction, by estimation, is <20%. The left ventricle has severely decreased function. The left  ventricle demonstrates global hypokinesis. The left ventricular internal cavity size was moderately dilated.  2. Right ventricular systolic function is severely reduced. The right ventricular size is normal.  3. Left atrial size was moderately dilated. Aaron left atrial/left atrial appendage thrombus was detected.  4. Right atrial size was moderately dilated.  5. The mitral valve is abnormal. Mild mitral valve regurgitation. There is mild prolapse of both leaflets of the mitral valve.  6. + Impella catheter. The aortic valve is tricuspid. Aortic valve regurgitation is not visualized. Aaron aortic stenosis is present.  7. 3D performed of the mitral valve and  demonstrates see above. Conclusion(s)/Recommendation(s): Pre-ECMO TTE images attached at the beginning of this study show LVEF 35-40% with flail posterior MV leaflet and severe MR Post-ECMO images with results aa above. FINDINGS  Left Ventricle: Post ECMO images with significant decrease in biventricular function compared to pre-ECMO TTE. Left ventricular ejection fraction, by estimation, is <20%. The left ventricle has severely decreased function. The left ventricle demonstrates global hypokinesis. The left ventricular internal cavity size was moderately dilated. Right Ventricle: The right ventricular size is normal. Aaron increase in right ventricular wall thickness. Right ventricular systolic function is severely reduced. Left Atrium: Left atrial size was moderately dilated. Aaron left atrial/left atrial appendage thrombus was detected. Right Atrium: Right atrial size was moderately dilated. Pericardium: There is Aaron evidence of pericardial effusion. Mitral Valve: The mitral valve is abnormal. There is mild prolapse of both leaflets of the mitral valve. Mild mitral valve regurgitation. Tricuspid Valve: The tricuspid valve is normal in structure. Tricuspid valve regurgitation is mild. Aortic Valve: + Impella catheter. The aortic valve is tricuspid. Aortic valve  regurgitation is not visualized. Aaron aortic stenosis is present. Pulmonic Valve: The pulmonic valve was grossly normal. Pulmonic valve regurgitation is not visualized. Aorta: The aortic root is normal in size and structure. IAS/Shunts: Aaron atrial level shunt detected by color flow Doppler. Additional Comments: 3D was performed not requiring image post processing on an independent workstation and was abnormal. AORTIC VALVE LVOT Vmax:   60.80 cm/s LVOT Vmean:  37.400 cm/s LVOT VTI:    0.105 m  SHUNTS Systemic VTI: 0.10 m Arvilla Meres MD Electronically signed by Arvilla Meres MD Signature Date/Time: 07/10/2023/5:32:16 PM    Final    CARDIAC CATHETERIZATION Addendum Date: 07/10/2023 Successful [facility] ECMO cannulation and Impella CP placement as a vent Arvilla Meres, MD 5:09 PM   Result Date: 07/10/2023 Successful VA ECMO cannulation and Impella CP placement as a vent Arvilla Meres, MD 5:09 PM  CT Angio Chest PE W/Cm &/Or Wo Cm Addendum Date: 07/09/2023 ADDENDUM REPORT: 07/09/2023 22:12 ADDENDUM: These results were called by telephone at the time of interpretation on 07/09/2023 at 10:12 pm to provider Elayne Snare , who verbally acknowledged these results. Electronically Signed   By: Darliss Cheney M.D.   On: 07/09/2023 22:12   Result Date: 07/09/2023 CLINICAL DATA:  Positive D-dimer with left-sided chest pressure. EXAM: CT ANGIOGRAPHY CHEST WITH CONTRAST TECHNIQUE: Multidetector CT imaging of the chest was performed using the standard protocol during bolus administration of intravenous contrast. Multiplanar CT image reconstructions and MIPs were obtained to evaluate the vascular anatomy. RADIATION DOSE REDUCTION: This exam was performed according to the departmental dose-optimization program which includes automated exposure control, adjustment of the mA and/or kV according to patient size and/or use of iterative reconstruction technique. CONTRAST:  75mL OMNIPAQUE IOHEXOL 350 MG/ML SOLN COMPARISON:   CT angiogram chest 03/30/2022. FINDINGS: Cardiovascular: Heart is mildly enlarged. There is Aaron pericardial effusion. Aorta is normal in size. There segmental bilateral lower lobe pulmonary emboli. Mediastinum/Nodes: There is an enlarged left hilar lymph node measuring up to 18 mm short axis, new from prior. Enlarged right hilar lymph node is new measuring 13 mm. Enlarged subcarinal lymph node measures up to 2 cm short axis, new from prior. There are numerous new nonenlarged paratracheal lymph nodes. Visualized esophagus and thyroid gland are within normal limits. Lungs/Pleura: There some patchy ground-glass opacities in the inferior left lower lobe. There is Aaron pleural effusion or pneumothorax. Upper Abdomen: Aaron acute abnormality. Musculoskeletal: Aaron chest wall abnormality. Aaron  acute or significant osseous findings. Review of the MIP images confirms the above findings. IMPRESSION: 1. Segmental bilateral lower lobe pulmonary emboli. Aaron evidence for right heart strain. 2. Patchy ground-glass opacities in the inferior left lower lobe worrisome for infarct. 3. New mediastinal and bilateral hilar lymphadenopathy. Findings may be reactive, but neoplasm is not excluded. Electronically Signed: By: Darliss Cheney M.D. On: 07/09/2023 22:06   DG Chest Port 1 View Result Date: 07/09/2023 CLINICAL DATA:  Left-sided chest pain for several days, initial encounter EXAM: PORTABLE CHEST 1 VIEW COMPARISON:  03/20/2023 FINDINGS: Cardiac shadow is mildly prominent but accentuated by the portable technique. The lungs are well aerated bilaterally. Aaron focal infiltrate or effusion is noted. Aaron acute bony abnormality is seen. IMPRESSION: Aaron acute abnormality noted. Electronically Signed   By: Alcide Clever M.D.   On: 07/09/2023 19:48    Labs: BNP (last 3 results) Recent Labs    07/09/23 1934 07/10/23 1224  BNP 703.7* 980.5*   Basic Metabolic Panel: Recent Labs  Lab 07/29/23 0427 07/30/23 0500 08/01/23 0418 08/02/23 0817  08/03/23 0401 08/04/23 0425  NA 132* 131* 129* 130* 130* 130*  K 4.6 4.6 5.3* 5.1 5.5* 5.5*  CL 97* 95* 95* 94* 94* 95*  CO2 21* 19* 21* 23 21* 22  GLUCOSE 151* 170* 156* 132* 91 95  BUN 90* 130* 117* 83* 94* 104*  CREATININE 7.20* 9.41* 9.47* 7.70* 9.15* 10.47*  CALCIUM 7.7* 7.9* 7.6* 8.0* 8.2* 7.9*  MG 2.3  --  2.3  --  2.2  --   PHOS 5.0* 5.3* 5.8*  --   --   --    Liver Function Tests: Recent Labs  Lab 07/29/23 0427 07/30/23 0500 07/31/23 0359 08/01/23 0418 08/03/23 0401  AST 396* 60* 46* 111* 39  ALT 147* 72* 49* 128* 65*  ALKPHOS 500* 282* 243* 494* 296*  BILITOT 0.9 0.9 0.7 0.8 0.9  PROT 6.6 6.1* 6.4* 6.0* 6.6  ALBUMIN 2.2* 2.1*  2.1* 2.2* 2.2* 2.5*   Aaron results for input(s): "LIPASE", "AMYLASE" in the last 168 hours. Aaron results for input(s): "AMMONIA" in the last 168 hours. CBC: Recent Labs  Lab 07/30/23 1430 08/01/23 0418 08/02/23 0817 08/03/23 0401 08/04/23 0425  WBC 14.1* 9.9 8.5 8.5 7.3  HGB 8.6* 8.4* 8.8* 9.4* 9.2*  HCT 27.1* 26.2* 27.3* 28.7* 28.2*  MCV 94.8 94.6 94.1 92.9 92.2  PLT 433* 432* 398 421* 407*   Cardiac Enzymes: Aaron results for input(s): "CKTOTAL", "CKMB", "CKMBINDEX", "TROPONINI" in the last 168 hours. BNP: Invalid input(s): "POCBNP" CBG: Recent Labs  Lab 08/03/23 1153 08/03/23 1639 08/03/23 2130 08/04/23 0740 08/04/23 1229  GLUCAP 105* 89 109* 88 99   D-Dimer Aaron results for input(s): "DDIMER" in the last 72 hours. Hgb A1c Aaron results for input(s): "HGBA1C" in the last 72 hours. Lipid Profile Aaron results for input(s): "CHOL", "HDL", "LDLCALC", "TRIG", "CHOLHDL", "LDLDIRECT" in the last 72 hours. Thyroid function studies Aaron results for input(s): "TSH", "T4TOTAL", "T3FREE", "THYROIDAB" in the last 72 hours.  Invalid input(s): "FREET3" Anemia work up Aaron results for input(s): "VITAMINB12", "FOLATE", "FERRITIN", "TIBC", "IRON", "RETICCTPCT" in the last 72 hours. Urinalysis    Component Value Date/Time   COLORURINE RED (A)  07/11/2023 1635   APPEARANCEUR HAZY (A) 07/11/2023 1635   LABSPEC  07/11/2023 1635    TEST NOT REPORTED DUE TO COLOR INTERFERENCE OF URINE PIGMENT   PHURINE  07/11/2023 1635    TEST NOT REPORTED DUE TO COLOR INTERFERENCE OF URINE PIGMENT  GLUCOSEU (A) 07/11/2023 1635    TEST NOT REPORTED DUE TO COLOR INTERFERENCE OF URINE PIGMENT   HGBUR (A) 07/11/2023 1635    TEST NOT REPORTED DUE TO COLOR INTERFERENCE OF URINE PIGMENT   BILIRUBINUR (A) 07/11/2023 1635    TEST NOT REPORTED DUE TO COLOR INTERFERENCE OF URINE PIGMENT   KETONESUR (A) 07/11/2023 1635    TEST NOT REPORTED DUE TO COLOR INTERFERENCE OF URINE PIGMENT   PROTEINUR (A) 07/11/2023 1635    TEST NOT REPORTED DUE TO COLOR INTERFERENCE OF URINE PIGMENT   UROBILINOGEN 4.0 (H) 10/10/2008 1905   NITRITE (A) 07/11/2023 1635    TEST NOT REPORTED DUE TO COLOR INTERFERENCE OF URINE PIGMENT   LEUKOCYTESUR (A) 07/11/2023 1635    TEST NOT REPORTED DUE TO COLOR INTERFERENCE OF URINE PIGMENT   Sepsis Labs Recent Labs  Lab 08/01/23 0418 08/02/23 0817 08/03/23 0401 08/04/23 0425  WBC 9.9 8.5 8.5 7.3   Microbiology Aaron results found for this or any previous visit (from the past 240 hours).   Time coordinating discharge: 35 minutes  SIGNED: Lanae Boast, MD  [facility] 08/04/2023, 12:35 PM  If 7PM-7AM, please contact night-coverage [email or web address]

## 2023-08-04 NOTE — Progress Notes (Addendum)
 Modified Barium Swallow Study  Patient Details  Name: Aaron [name] MRN: [id number] Date of Birth: 1985-07-05  Today's Date: 08/04/2023  Modified Barium Swallow completed.  Full report located under Chart Review in the Imaging Section.  History of Present Illness Aaron [name] is a 38 yo male presenting to ED 2/26 with chest pain and dyspnea. Admitted with bilateral lower lobe pulmonary emboli. VT/VF cardiac arrest 2/27, requiring 40 minutes of CPR and multiple shocks. Impella placed 2/27-3/12. ECMO initiated 2/27-3/8. CRRT stopped 3/15. ETT 2/27-3/12, reintubated 3/12-3/16 s/p mucus plugging and L lung opacification. 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 MBS 3/18 showed gross silent aspiration of thin and nectar thick liquids. SLP recommended pt start diet of Dys 3 solids with honey thick liquids and initiated use of EMST. PMH includes hypothyroidism, paroxysmal A-fib, severe mitral regurgitation   Clinical Impression Pt presents with moderate oropharyngeal dysphagia. While this is improved from [facility] 3/18, pt continues to have deficits related to timing and airway protection leading to penetration and aspiration of thin and nectar thick liquids. Pt's cough is intermittently more effective at clearing penetrates from the vocal folds, but he is not always able to expel penetrates completely (PAS 5). There was one instance of trace aspiration with thin liquids (PAS 8) that was not cleared by a cued cough. A chin tuck posture was effective in preventing penetrates from reaching the vocal folds (PAS 2). He continues to have mild pharyngeal residue. Pt has progressed significantly with noted improvements related to cognition, reduced presence of tubes (supplemental O2 and NGT), as well as with cough strength this admission, reducing risk for aspiration related infections. Recommend continuing regular diet but  ugprading to thin liquids. He may benefit from full supervision initially to ensure he uses a chin tuck posture any time he drinks liquids. Encourage intermittent hard coughing. Pt is receptive to exercises that will improve his swallowing. SLP will f/u to continue targeting compensatory strategies and EMST with consideration of using a device with a higher threshold.  DIGEST Swallow Severity Rating*  Safety: 2  Efficiency: 1  Overall Pharyngeal Swallow Severity: 2 (moderate) 1: mild; 2: moderate; 3: severe; 4: profound  *The Dynamic Imaging Grade of Swallowing Toxicity is standardized for the head and neck cancer population, however, demonstrates promising clinical applications across populations to standardize the clinical rating of pharyngeal swallow safety and severity.  Factors that may increase risk of adverse event in presence of aspiration Aaron [name] & Aaron [name] 2021): Poor general health and/or compromised immunity;Frail or deconditioned  Swallow Evaluation Recommendations Recommendations: PO diet PO Diet Recommendation: Regular;Thin liquids (Level 0) Liquid Administration via: Cup;Straw Medication Administration: Whole meds with puree Supervision: Staff to assist with self-feeding;Full supervision/cueing for swallowing strategies Swallowing strategies  : Chin tuck;Hard cough after swallowing;Slow rate;Small bites/sips Postural changes: Position pt fully upright for meals Oral care recommendations: Oral care QID (4x/day);Oral care before PO;Use suctioning for oral care Recommended consults: Consider ENT consultation      Aaron [name], M.A., CF-SLP Speech Language Pathology, Acute Rehabilitation Services  Secure Chat preferred [phone]  08/04/2023,3:41 PM

## 2023-08-04 NOTE — Progress Notes (Signed)
 OT Cancellation Note  Patient Details Name: ED MANDICH MRN: [id number] DOB: 1985/11/20   Cancelled Treatment:    Reason Eval/Treat Not Completed: Patient at procedure or test/ unavailable Off unit for HD this AM. Will follow up for OT session as schedule permits.   Lorre Munroe 08/04/2023, 8:15 AM

## 2023-08-05 ENCOUNTER — Other Ambulatory Visit (HOSPITAL_COMMUNITY)

## 2023-08-05 DIAGNOSIS — I4891 Unspecified atrial fibrillation: Secondary | ICD-10-CM

## 2023-08-05 DIAGNOSIS — E440 Moderate protein-calorie malnutrition: Secondary | ICD-10-CM | POA: Insufficient documentation

## 2023-08-05 DIAGNOSIS — R5381 Other malaise: Secondary | ICD-10-CM | POA: Diagnosis not present

## 2023-08-05 DIAGNOSIS — G4700 Insomnia, unspecified: Secondary | ICD-10-CM

## 2023-08-05 DIAGNOSIS — R110 Nausea: Secondary | ICD-10-CM | POA: Diagnosis not present

## 2023-08-05 DIAGNOSIS — D649 Anemia, unspecified: Secondary | ICD-10-CM | POA: Diagnosis not present

## 2023-08-05 LAB — COMPREHENSIVE METABOLIC PANEL
ALT: 47 U/L — ABNORMAL HIGH (ref 0–44)
AST: 33 U/L (ref 15–41)
Albumin: 2.3 g/dL — ABNORMAL LOW (ref 3.5–5.0)
Alkaline Phosphatase: 211 U/L — ABNORMAL HIGH (ref 38–126)
Anion gap: 15 (ref 5–15)
BUN: 65 mg/dL — ABNORMAL HIGH (ref 6–20)
CO2: 23 mmol/L (ref 22–32)
Calcium: 7.9 mg/dL — ABNORMAL LOW (ref 8.9–10.3)
Chloride: 94 mmol/L — ABNORMAL LOW (ref 98–111)
Creatinine, Ser: 7.73 mg/dL — ABNORMAL HIGH (ref 0.61–1.24)
GFR, Estimated: 8 mL/min — ABNORMAL LOW (ref >60)
Glucose, Bld: 92 mg/dL (ref 70–99)
Potassium: 4.5 mmol/L (ref 3.5–5.1)
Sodium: 132 mmol/L — ABNORMAL LOW (ref 135–145)
Total Bilirubin: 0.7 mg/dL (ref 0.0–1.2)
Total Protein: 6.2 g/dL — ABNORMAL LOW (ref 6.5–8.1)

## 2023-08-05 LAB — CBC WITH DIFFERENTIAL/PLATELET
Abs Immature Granulocytes: 0.11 10*3/uL — ABNORMAL HIGH (ref 0.00–0.07)
Basophils Absolute: 0.1 10*3/uL (ref 0.0–0.1)
Basophils Relative: 1 %
Eosinophils Absolute: 0.2 10*3/uL (ref 0.0–0.5)
Eosinophils Relative: 4 %
HCT: 29.9 % — ABNORMAL LOW (ref 39.0–52.0)
Hemoglobin: 9.5 g/dL — ABNORMAL LOW (ref 13.0–17.0)
Immature Granulocytes: 2 %
Lymphocytes Relative: 18 %
Lymphs Abs: 1 10*3/uL (ref 0.7–4.0)
MCH: 30.3 pg (ref 26.0–34.0)
MCHC: 31.8 g/dL (ref 30.0–36.0)
MCV: 95.2 fL (ref 80.0–100.0)
Monocytes Absolute: 0.9 10*3/uL (ref 0.1–1.0)
Monocytes Relative: 16 %
Neutro Abs: 3.5 10*3/uL (ref 1.7–7.7)
Neutrophils Relative %: 59 %
Platelets: 357 10*3/uL (ref 150–400)
RBC: 3.14 MIL/uL — ABNORMAL LOW (ref 4.22–5.81)
RDW: 16.8 % — ABNORMAL HIGH (ref 11.5–15.5)
WBC: 5.9 10*3/uL (ref 4.0–10.5)
nRBC: 0 % (ref 0.0–0.2)

## 2023-08-05 LAB — GLUCOSE, CAPILLARY
Glucose-Capillary: 111 mg/dL — ABNORMAL HIGH (ref 70–99)
Glucose-Capillary: 77 mg/dL (ref 70–99)
Glucose-Capillary: 100 mg/dL — ABNORMAL HIGH (ref 70–99)
Glucose-Capillary: 137 mg/dL — ABNORMAL HIGH (ref 70–99)
Glucose-Capillary: 77 mg/dL (ref 70–99)
Glucose-Capillary: 148 mg/dL — ABNORMAL HIGH (ref 70–99)

## 2023-08-05 LAB — HEPARIN LEVEL (UNFRACTIONATED): Heparin Unfractionated: 0.55 [IU]/mL (ref 0.30–0.70)

## 2023-08-05 MED ORDER — MELATONIN 3 MG PO TABS
3.0000 mg | ORAL_TABLET | Freq: Every evening | ORAL | Status: DC | PRN
  Administered 2023-08-06: 3 mg via ORAL
  Filled 2023-08-05 (×2): qty 1

## 2023-08-05 MED ORDER — CHLORHEXIDINE GLUCONATE CLOTH 2 % EX PADS
6.0000 | MEDICATED_PAD | Freq: Two times a day (BID) | CUTANEOUS | Status: DC
  Administered 2023-08-05 – 2023-08-13 (×13): 6 via TOPICAL

## 2023-08-05 MED ORDER — ASPIRIN 81 MG PO CHEW
81.0000 mg | CHEWABLE_TABLET | Freq: Every day | ORAL | Status: DC
  Administered 2023-08-06: 81 mg via ORAL
  Filled 2023-08-05 (×2): qty 1

## 2023-08-05 MED ORDER — FOLIC ACID 1 MG PO TABS
1.0000 mg | ORAL_TABLET | Freq: Every day | ORAL | Status: DC
  Administered 2023-08-06 – 2023-08-13 (×6): 1 mg via ORAL
  Filled 2023-08-05 (×7): qty 1

## 2023-08-05 MED ORDER — NEPRO/CARBSTEADY PO LIQD
237.0000 mL | ORAL | Status: DC
  Administered 2023-08-05: 237 mL via ORAL

## 2023-08-05 MED ORDER — SODIUM CHLORIDE 0.9% FLUSH
10.0000 mL | Freq: Two times a day (BID) | INTRAVENOUS | Status: DC
  Administered 2023-08-10 – 2023-08-14 (×4): 10 mL

## 2023-08-05 MED ORDER — THIAMINE MONONITRATE 100 MG PO TABS
100.0000 mg | ORAL_TABLET | Freq: Every day | ORAL | Status: DC
  Administered 2023-08-06 – 2023-08-13 (×6): 100 mg via ORAL
  Filled 2023-08-05 (×7): qty 1

## 2023-08-05 NOTE — Progress Notes (Addendum)
 PROGRESS NOTE   Subjective/Complaints: Patient reports chronic poor sleep at night.  He reports he gets some nausea after he takes medications every morning.  Urinating frequently.  ROS: Patient denies fever, new vision changes,  vomiting, diarrhea,  shortness of breath or chest pain, headache, or mood change.  + dizziness, nausea after AM medications +chronic insomnia  Objective:   DG Swallowing Func-Speech Pathology Result Date: 08/04/2023 Table formatting from the original result was not included. Modified Barium Swallow Study Patient Details Name: Aaron [name] MRN: [id number] Date of Birth: 04-09-1986 Today's Date: 08/04/2023 HPI/PMH: HPI: Aaron [name] is a 38 yo male presenting to ED 2/26 with chest pain and dyspnea. Admitted with bilateral lower lobe pulmonary emboli. VT/VF cardiac arrest 2/27, requiring 40 minutes of CPR and multiple shocks. Impella placed 2/27-3/12. ECMO initiated 2/27-3/8. CRRT stopped 3/15. ETT 2/27-3/12, reintubated 3/12-3/16 s/p mucus plugging and L lung opacification. 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 MBS 3/18 showed gross silent aspiration of thin and nectar thick liquids. SLP recommended pt start diet of Dys 3 solids with honey thick liquids and initiated use of EMST. PMH includes hypothyroidism, paroxysmal A-fib, severe mitral regurgitation Clinical Impression: Pt presents with moderate oropharyngeal dysphagia. While this is improved from [name] 3/18, pt continues to have deficits related to timing and airway protection leading to penetration and aspiration of thin and nectar thick liquids. Pt's cough is intermittently more effective at clearing penetrates from the vocal folds, but he is not always able to expel penetrates completely (PAS 5). There was one instance of trace aspiration with thin liquids (PAS 8) that  was not cleared by a cued cough. A chin tuck posture was effective in preventing penetrates from reaching the vocal folds (PAS 2). He continues to have mild pharyngeal residue. Recommend continuing regular diet but ugprading to thin liquids. He may benefit from full supervision initially to ensure he uses a chin tuck posture any time he drinks liquids. Encourage intermittent hard coughing. Pt is receptive to exercises that will improve his swallowing. SLP will f/u to continue targeting compensatory strategies and EMST with consideration of using a device with a higher threshold. DIGEST Swallow Severity Rating*  Safety: 2  Efficiency: 1  Overall Pharyngeal Swallow Severity: 2 (moderate) 1: mild; 2: moderate; 3: severe; 4: profound *The Dynamic Imaging Grade of Swallowing Toxicity is standardized for the head and neck cancer population, however, demonstrates promising clinical applications across populations to standardize the clinical rating of pharyngeal swallow safety and severity. Factors that may increase risk of adverse event in presence of aspiration Aaron [name] & Aaron [name] 2021): Factors that may increase risk of adverse event in presence of aspiration Aaron [name] & Aaron [name] 2021): Poor general health and/or compromised immunity; Frail or deconditioned Recommendations/Plan: Swallowing Evaluation Recommendations Swallowing Evaluation Recommendations Recommendations: PO diet PO Diet Recommendation: Regular; Thin liquids (Level 0) Liquid Administration via: Cup; Straw Medication Administration: Whole meds with puree Supervision: Staff to assist with self-feeding; Full supervision/cueing for swallowing strategies Swallowing strategies  : Chin tuck; Hard cough after swallowing; Slow rate; Small bites/sips Postural changes: Position pt fully upright for  meals Oral care recommendations: Oral care QID (4x/day); Oral care before PO; Use suctioning for oral care Recommended consults: Consider ENT consultation Treatment Plan  Treatment Plan Treatment recommendations: Therapy as outlined in treatment plan below Follow-up recommendations: Acute inpatient rehab (3 hours/day) Functional status assessment: Patient has had a recent decline in their functional status and demonstrates the ability to make significant improvements in function in a reasonable and predictable amount of time. Treatment frequency: Min 2x/week Treatment duration: 2 weeks Interventions: Aspiration precaution training; Compensatory techniques; Patient/family education; Trials of upgraded texture/liquids; Diet toleration management by SLP; Respiratory muscle strength training Recommendations Recommendations for follow up therapy are one component of a multi-disciplinary discharge planning process, led by the attending physician.  Recommendations may be updated based on patient status, additional functional criteria and insurance authorization. Assessment: Orofacial Exam: Orofacial Exam Oral Cavity: Oral Hygiene: WFL Oral Cavity - Dentition: Adequate natural dentition Orofacial Anatomy: WFL Oral Motor/Sensory Function: WFL Anatomy: Anatomy: WFL Boluses Administered: Boluses Administered Boluses Administered: Thin liquids (Level 0); Mildly thick liquids (Level 2, nectar thick); Moderately thick liquids (Level 3, honey thick); Solid  Oral Impairment Domain: Oral Impairment Domain Lip Closure: No labial escape Tongue control during bolus hold: Cohesive bolus between tongue to palatal seal Bolus preparation/mastication: Timely and efficient chewing and mashing Bolus transport/lingual motion: Brisk tongue motion Oral residue: Complete oral Aaron Location of oral residue : N/A Initiation of pharyngeal swallow : Posterior angle of the ramus  Pharyngeal Impairment Domain: Pharyngeal Impairment Domain Soft palate elevation: No bolus between soft palate (SP)/pharyngeal wall (PW) Laryngeal elevation: Complete superior movement of thyroid cartilage with complete approximation of  arytenoids to epiglottic petiole Anterior hyoid excursion: Complete anterior movement Epiglottic movement: Complete inversion Laryngeal vestibule closure: Complete, no air/contrast in laryngeal vestibule Pharyngeal stripping wave : Present - complete Pharyngeal contraction (A/P view only): N/A Pharyngoesophageal segment opening: Complete distension and complete duration, no obstruction of flow Tongue base retraction: No contrast between tongue base and posterior pharyngeal wall (PPW) Pharyngeal residue: Trace residue within or on pharyngeal structures Location of pharyngeal residue: Valleculae; Pyriform sinuses  Esophageal Impairment Domain: No data recorded Pill: No data recorded Penetration/Aspiration Scale Score: Penetration/Aspiration Scale Score 1.  Material does not enter airway: Moderately thick liquids (Level 3, honey thick); Solid 4.  Material enters airway, CONTACTS cords then ejected out: Mildly thick liquids (Level 2, nectar thick) 8.  Material enters airway, passes BELOW cords without attempt by patient to eject out (silent aspiration) : Thin liquids (Level 0) Compensatory Strategies: Compensatory Strategies Compensatory strategies: Yes Straw: Ineffective Ineffective Straw: Thin liquid (Level 0); Mildly thick liquid (Level 2, nectar thick) Chin tuck: Effective Effective Chin Tuck: Thin liquid (Level 0)   General Information: Caregiver present: No  Diet Prior to this Study: Regular; Moderately thick liquids (Level 3, honey thick)   Temperature : Normal   Respiratory Status: WFL   Supplemental O2: None (Room air)   History of Recent Intubation: Yes  Behavior/Cognition: Alert; Cooperative; Pleasant mood Self-Feeding Abilities: Able to self-feed Baseline vocal quality/speech: Dysphonic Volitional Cough: Able to elicit Volitional Swallow: Able to elicit Exam Limitations: No limitations Goal Planning: Prognosis for improved oropharyngeal function: Good Barriers to Reach Goals: Cognitive deficits; Time post  onset; Severity of deficits No data recorded Patient/Family Stated Goal: none stated Consulted and agree with results and recommendations: Patient Pain: Pain Assessment Pain Assessment: No/denies pain End of Session: Start Time:SLP Start Time (ACUTE ONLY): 1426 Stop Time: SLP Stop Time (ACUTE ONLY): 1442 Time Calculation:SLP Time Calculation (  min) (ACUTE ONLY): 16 min Charges: SLP Evaluations $ SLP Speech Visit: 1 Visit SLP Evaluations $MBS Swallow: 1 Procedure $Swallowing Treatment: 1 Procedure SLP visit diagnosis: SLP Visit Diagnosis: Dysphagia, oropharyngeal phase (R13.12) Past Medical History: Past Medical History: Diagnosis Date  Atrial fibrillation (HCC)   Hyperthyroidism   Mitral regurgitation   NSTEMI (non-ST elevated myocardial infarction) [name])  Past Surgical History: Past Surgical History: Procedure Laterality Date  ECMO CANNULATION N/A 07/10/2023  Procedure: ECMO CANNULATION;  Surgeon: Dolores Patty, MD;  Location: [facility] INVASIVE CV LAB;  Service: Cardiovascular;  Laterality: N/A;  EMBOLECTOMY Left 07/18/2023  Procedure: EMBOLECTOMY Left Femoral, Popliteal and iliac arterys,  Repair Left common femoral artery.;  Surgeon: Victorino Sparrow, MD;  Location: [facility] OR;  Service: Vascular;  Laterality: Left;  INTRAOPERATIVE TRANSESOPHAGEAL ECHOCARDIOGRAM N/A 07/23/2023  Procedure: ECHOCARDIOGRAM, TRANSESOPHAGEAL, INTRAOPERATIVE;  Surgeon: Loreli Slot, MD;  Location: [facility] OR;  Service: Open Heart Surgery;  Laterality: N/A;  LEFT HEART CATH AND CORONARY ANGIOGRAPHY N/A 10/22/2021  Procedure: LEFT HEART CATH AND CORONARY ANGIOGRAPHY;  Surgeon: Yvonne Kendall, MD;  Location: [facility] INVASIVE CV LAB;  Service: Cardiovascular;  Laterality: N/A;  PLACEMENT OF IMPELLA LEFT VENTRICULAR ASSIST DEVICE Right 07/14/2023  Procedure: PLACEMENT OF RIGHT AXILLARY IMPELLA 5.5 LEFT VENTRICULAR ASSIST DEVICE;  Surgeon: Loreli Slot, MD;  Location: [facility] OR;  Service: Open Heart Surgery;  Laterality: Right;  REMOVAL OF IMPELLA  LEFT VENTRICULAR ASSIST DEVICE Right 07/14/2023  Procedure: REMOVAL OF CP RIGHT FEMORAL IMPELLA LEFT VENTRICULAR ASSIST DEVICE;  Surgeon: Loreli Slot, MD;  Location: [facility] OR;  Service: Open Heart Surgery;  Laterality: Right;  REMOVAL OF IMPELLA LEFT VENTRICULAR ASSIST DEVICE N/A 07/23/2023  Procedure: REMOVAL, CARDIAC ASSIST DEVICE, IMPELLA;  Surgeon: Loreli Slot, MD;  Location: [facility] OR;  Service: Open Heart Surgery;  Laterality: N/A;  TRANSCATHETER MITRAL EDGE TO EDGE REPAIR N/A 07/16/2023  Procedure: TRANSCATHETER MITRAL EDGE TO EDGE REPAIR;  Surgeon: Tonny Bollman, MD;  Location: [name];  Service: Cardiovascular;  Laterality: N/A;  TRANSESOPHAGEAL ECHOCARDIOGRAM (CATH LAB) N/A 07/16/2023  Procedure: TRANSESOPHAGEAL ECHOCARDIOGRAM;  Surgeon: Tonny Bollman, MD;  Location: [name] INVASIVE CV LAB;  Service: Cardiovascular;  Laterality: N/A;  VENTRICULAR ASSIST DEVICE INSERTION N/A 07/10/2023  Procedure: VENTRICULAR ASSIST DEVICE INSERTION;  Surgeon: Dolores Patty, MD;  Location: [facility] INVASIVE CV LAB;  Service: Cardiovascular;  Laterality: N/A; Gwynneth Aliment, M.A., CF-SLP Speech Language Pathology, Acute Rehabilitation Services Secure Chat preferred [phone] 08/04/2023, 3:43 PM  Recent Labs    08/04/23 0425 08/05/23 0500  WBC 7.3 5.9  HGB 9.2* 9.5*  HCT 28.2* 29.9*  PLT 407* 357   Recent Labs    08/04/23 0425 08/05/23 0500  NA 130* 132*  K 5.5* 4.5  CL 95* 94*  CO2 22 23  GLUCOSE 95 92  BUN 104* 65*  CREATININE 10.47* 7.73*  CALCIUM 7.9* 7.9*    Intake/Output Summary (Last 24 hours) at 08/05/2023 1405 Last data filed at 08/05/2023 1306 Gross per 24 hour  Intake 422.15 ml  Output 1550 ml  Net -1127.85 ml     Pressure Injury 07/23/23 Sacrum Mid Stage 2 -  Partial thickness loss of dermis presenting as a shallow open injury with a red, pink wound bed without slough. light pink (Active)  07/23/23 1200  Location: Sacrum  Location Orientation: Mid  Staging: Stage  2 -  Partial thickness loss of dermis presenting as a shallow open injury with a red, pink wound bed without slough.  Wound Description (Comments): light  pink  Present on Admission: Yes    Physical Exam: Vital Signs Blood pressure 127/83, pulse 88, temperature 98.3 F (36.8 C), temperature source Oral, resp. rate 18, height 6\' 4"  (1.93 m), weight 86.5 kg, SpO2 100%.   General: NAD Mood and affect are appropriate Heart: RRR, staples right upper chest Lungs: Clear to auscultation, breathing unlabored, no rales or wheezes Abdomen: Positive bowel sounds, soft nontender to palpation, nondistended Extremities: No clubbing, cyanosis, or edema Skin: No evidence of breakdown, no evidence of rash, HD cath Neurologic: Cranial nerves II through XII intact, motor strength is 5/5 in bilateral deltoid, bicep, tricep, grip, 4/5 hip flexor, knee extensors, ankle dorsiflexor and plantar flexor Sensory exam normal sensation to light touch in bilateral upper and lower extremities Cerebellar exam normal finger to nose to finger Musculoskeletal: Full range of motion in all 4 extremities. No joint swelling  Neurological:     Comments: Alert and oriented x 3, follows simple commands, he did have some difficulty recalling his hospital course.     Assessment/Plan: 1. Functional deficits which require 3+ hours per day of interdisciplinary therapy in a comprehensive inpatient rehab setting. Physiatrist is providing close team supervision and 24 hour management of active medical problems listed below. Physiatrist and rehab team continue to assess barriers to discharge/monitor patient progress toward functional and medical goals  Care Tool:  Bathing    Body parts bathed by patient: Right arm, Left arm, Chest, Abdomen, Front perineal area, Buttocks, Right upper leg, Left upper leg, Face (Bed-level)   Body parts bathed by helper: Right lower leg, Left lower leg     Bathing assist Assist Level: Minimal  Assistance - Patient > 75% (Bed-level)     Upper Body Dressing/Undressing Upper body dressing   What is the patient wearing?: Pull over shirt    Upper body assist Assist Level: Set up assist (Bed-level)    Lower Body Dressing/Undressing Lower body dressing            Lower body assist Assist for lower body dressing: Moderate Assistance - Patient 50 - 74% (Bed-level)     Toileting Toileting    Toileting assist Assist for toileting: Moderate Assistance - Patient 50 - 74%     Transfers Chair/bed transfer  Transfers assist           Locomotion Ambulation   Ambulation assist              Walk 10 feet activity   Assist           Walk 50 feet activity   Assist           Walk 150 feet activity   Assist           Walk 10 feet on uneven surface  activity   Assist           Wheelchair     Assist               Wheelchair 50 feet with 2 turns activity    Assist            Wheelchair 150 feet activity     Assist          Blood pressure 127/83, pulse 88, temperature 98.3 F (36.8 C), temperature source Oral, resp. rate 18, height 6\' 4"  (1.93 m), weight 86.5 kg, SpO2 100%.  Medical Problem List and Plan: 1. Functional deficits secondary to debility/pulmonary emboli located by small infarct right cerebral and cerebellar hemispheres/hypoxic brain injury             -  patient may not shower             -ELOS/Goals: 18-21d/ sup  -Continue CIR, PT OT and SLP 2.  Antithrombotics: -DVT/anticoagulation:  Pharmaceutical: Heparin transitioning to Eliquis after TDC HD catheter placed             -antiplatelet therapy: Aspirin 81 mg daily 3. Pain Management: Oxycodone as needed 4. Mood/Behavior/Sleep: Provide emotional support             -antipsychotic agents: N/A  -3/25 melatonin as needed ordered for insomnia 5. Neuropsych/cognition: This patient is capable of making decisions on his own behalf. 6. Skin/Wound  Care: Routine skin checks 7. Fluids/Electrolytes/Nutrition: Routine in and outs with follow-up chemistries 8.  Cardiogenic shock/acute systolic congestive heart failure/A-fib/severe MR/SVT/PEA arrest.  ROSC x 40 minutes.  Follow-up cardiology services status post ECMO with Impella, decannulated 3/8 and Impella removed and extubated 3/12-reintubated but eventually extubated again 3/16.  Currently on amiodarone 200 mg daily  -3/25 HF team, cardiology plans to stop Amio before discharge.  Appreciate assistance 9.  Acute kidney injury due to ATN/hyperkalemia.  Follow-up renal services.  Required CRRT transition to hemodialysis 10.  Normocytic anemia.  Follow-up CBC.  Continue Aranesp  -3/25 HGB stable at 9.5 11.  Transaminitis.  Likely shock liver.  Follow-up chemistries 12.  Decreased nutritional storage.  Dietary follow-up.  Follow-up MBS.  -3/25 by nutrition today 13.  Hypothyroidism.  Continue Tapazole 10 mg daily.  Need to check TSH in 4 weeks. 14.  Mild a.m. nausea  -Will spread out medications.  Amiodarone may be contributing to this.    LOS: 1 days A FACE TO FACE EVALUATION WAS PERFORMED  Fanny Dance 08/05/2023, 2:05 PM

## 2023-08-05 NOTE — Progress Notes (Addendum)
 Patient ID: Aaron [name], male   DOB: May 29, 1985, 38 y.o.   MRN: [id number]     Advanced Heart Failure Rounding Note  Cardiologist: Christell Constant, MD  Chief Complaint: V-A ECMO  Subjective:   - Admitted 2/26 with worsening SOB, CP and a fib with RVR. - Decompensated 2/27 with IVF, nodal blockade with subsequent respiratory arrest, ROSC achieved after ~16 minute down time - Femoral VA ecmo cannulation 2/27 with Impella CP vent - ECMO decannulation 3/8 - Impella removed and extubated 3/12.  Had to be reintubated with mucus plugging and left lung opacification.  - MRI head 3/13 with small infarcts right cerebral and cerebellar hemispheres - Re-extubated 3/16  Had iHD yesterday. Some UOP documented yesterday.   Feels good this morning. He's tired of getting up and going to the bathroom frequently (told him this was a good thing)! Has not worked out yet as he got there late yesterday. GF at bedside.   Objective:   Echo 08/01/23 EF 40-45% mild residual MR. RV ok   Weight Range: 86.5 kg Body mass index is 23.21 kg/m.   Vital Signs:   Temp:  [98.1 F (36.7 C)-98.6 F (37 C)] 98.2 F (36.8 C) (03/25 0509) Pulse Rate:  [60-95] 80 (03/25 0602) Resp:  [17-24] 19 (03/25 0509) BP: (117-136)/(78-100) 127/78 (03/25 0602) SpO2:  [97 %-100 %] 99 % (03/25 0743) Weight:  [86.5 kg-88.6 kg] 86.5 kg (03/25 0602) Last BM Date : 08/04/23  Weight change: Filed Weights   08/04/23 1625 08/05/23 0602  Weight: 88.6 kg 86.5 kg    Intake/Output:   Intake/Output Summary (Last 24 hours) at 08/05/2023 0855 Last data filed at 08/05/2023 0850 Gross per 24 hour  Intake 118 ml  Output 950 ml  Net -832 ml    Physical Exam  General:  well appearing.  No respiratory difficulty Neck: supple. JVD ~6 cm.  Cor: PMI nondisplaced. Regular rate & rhythm. No rubs, gallops or murmurs. [facility] HD cath. Staples RU chest Lungs: clear Extremities: no cyanosis, clubbing, rash, edema  Neuro: alert & oriented x  3. Moves all 4 extremities w/o difficulty. Affect pleasant.   Telemetry  Not on tele  Labs  CBC Recent Labs    08/04/23 0425 08/05/23 0500  WBC 7.3 5.9  NEUTROABS  --  3.5  HGB 9.2* 9.5*  HCT 28.2* 29.9*  MCV 92.2 95.2  PLT 407* 357   Basic Metabolic Panel Recent Labs    56/21/30 0401 08/04/23 0425 08/05/23 0500  NA 130* 130* 132*  K 5.5* 5.5* 4.5  CL 94* 95* 94*  CO2 21* 22 23  GLUCOSE 91 95 92  BUN 94* 104* 65*  CREATININE 9.15* 10.47* 7.73*  CALCIUM 8.2* 7.9* 7.9*  MG 2.2  --   --    Liver Function Tests Recent Labs    08/03/23 0401 08/05/23 0500  AST 39 33  ALT 65* 47*  ALKPHOS 296* 211*  BILITOT 0.9 0.7  PROT 6.6 6.2*  ALBUMIN 2.5* 2.3*   BNP: BNP (last 3 results) Recent Labs    07/09/23 1934 07/10/23 1224  BNP 703.7* 980.5*   Thyroid Function Tests No results for input(s): "TSH", "T4TOTAL", "T3FREE", "THYROIDAB" in the last 72 hours.  Invalid input(s): "FREET3"  Medications:   Scheduled Medications:  amiodarone  200 mg Oral Daily   arformoterol  15 mcg Nebulization BID   ascorbic acid  250 mg Oral BID   aspirin  81 mg Oral Daily   budesonide (PULMICORT)  nebulizer solution  0.25 mg Nebulization BID   Chlorhexidine Gluconate Cloth  6 each Topical Q12H   [START ON 08/08/2023] darbepoetin (ARANESP) injection - DIALYSIS  200 mcg Subcutaneous Q Fri-1800   folic acid  1 mg Oral Daily   insulin aspart  0-20 Units Subcutaneous TID WC   methimazole  10 mg Oral Daily   multivitamin  1 tablet Oral QHS   revefenacin  175 mcg Nebulization Daily   sodium chloride flush  10-40 mL Intracatheter Q12H   thiamine  100 mg Oral Daily   zinc sulfate (50mg  elemental zinc)  220 mg Oral Daily  Infusions:  heparin 1,900 Units/hr (08/05/23 0020)    PRN Medications: acetaminophen, Gerhardt's butt cream, lactulose, oxyCODONE, polyethylene glycol Patient Profile   Patient with a history of Grave's disease, severe primary mitral regurgitation who presented with  chest pain and segmental PE. Subsequent progressed to SCAI Stage E shock requiring VA ECMO cannulation on 2/27.   Assessment/Plan  SCAI Stage E Cardiogenic shock - Suspect hypoxic respiratory failure driven with segmental PE on top of severe MR, nodal blockage, and thyrotoxicosis - Down time ~ 20 minutes, good mental status confirmed post code - Underwent successful mTEER on 07/16/23 with placement of x3 clips and improvement in MR from severe to mild. Mean mitral gradient of 3-75mmHg.  - Decannulated from V-A ECMO by Dr. Karin Lieu on 07/19/23 with vascular cut down and primary repair of left femoral artery.  - Echo 3/10: EF 35-40%, anterior and septal HK, normal RV, s/p Mitraclip with mild MR and mean gradient 7 mmHg.  - Impella removed 3/12.  - Echo 08/01/23 EF 40-45% mild residual MR. RV ok  - Resolved. Doing well today. GDMT limited by AKI/CKD   Severe mitral valve regurgitation - Noted on previous echocardiogram - Now s/p mTEER on 07/16/23; improvement in MR from severe to mild.  - Echo with mild residual MR  Pulmonary hypertension - Due to severe MR - Now much improved.   VT/NSVT - Initially with frequent runs of NSVT likely secondary to impella 5.5 position. Impella now out.  - no further VT. Now off mexilitene - continue amiodarone 200 mg daily for now. Stop prior to d/c   Acute renal failure/hyperkalemia - Nephrology following. Now on iHD.  - Starting to make urine. Not documented at [facility] but reports frequent trips to the restroom.  - TDC on hold with some renal recovery - Plan to transition to Eliquis when long term HD access in place if needed  Thyrotoxicosis - Was not taking medications as they made him feel poorly - Suspect underlying low output heart failure due to mitral valve disease - On methimazole 10 tid.   Atrial fibrillation - Present on arrival, in the setting of PE and thyroid disease - maintaining NSR  - Continue po amio for now  - heparin gtt. Eliquis after  possible TDC placed  Pulmonary embolism:  - Segmental without right heart strain - Heparin gtt.  Eliquis after possible TDC placed  CAD - Prior embolic infarct in the LAD territory with corresponding LGE on CMR - Lifelong anticoagulation - On heparin gtt   CVA - MRI head 3/13 with small infarcts right cerebral and cerebellar hemispheres. This should not significantly affect consciousness.  - Suspect embolic, on heparin gtt.   Hyperkalemia - resolved with iHD  Now in CIR.   Length of Stay: 1  Alen Bleacher, NP  08/05/2023, 8:55 AM  Advanced Heart Failure Team Pager [phone] (M-F;  7a - 5p)  Please contact [facility] for night-coverage after hours (5p -7a ) and weekends on [email or web address]   Patient seen and examined with the above-signed Advanced Practice Provider and/or Housestaff. I personally reviewed laboratory data, imaging studies and relevant notes. I independently examined the patient and formulated the important aspects of the plan. I have edited the note to reflect any of my changes or salient points. I have personally discussed the plan with the patient and/or family.  Has been moved to CIR. Doing well. Denies CP or SOB. Remains in NSR. BUN/Cr remain high  General:  Sitting up No resp difficulty HEENT: normal Neck: supple.+ [facility] HD cath  Cor: PMI nondisplaced. Regular rate & rhythm.2/6 TRs. Lungs: clear Abdomen: soft, nontender, nondistended. No hepatosplenomegaly. No bruits or masses. Good bowel sounds. Extremities: no cyanosis, clubbing, rash, edema Neuro: alert & orientedx3, cranial nerves grossly intact. moves all 4 extremities w/o difficulty. Affect pleasant  Tolerated iHD well yesterday. Made about 1L of urine   Doing well. Volume status looks good. Remains in NSR. Continue heparin for now until decision made on [facility] - hopefully with renal recovery we can avoid.   Would stop amio prior to d/c.   Arvilla Meres, MD  10:30 AM  Addendum:  I removed  remaining Impella site staples.   Arvilla Meres, MD  2:42 PM

## 2023-08-05 NOTE — Progress Notes (Signed)
 Inpatient Rehabilitation Care Coordinator Assessment and Plan Patient Details  Name: Aaron [name] MRN: [id number] Date of Birth: March 10, 1986  Today's Date: 08/05/2023  Hospital Problems: Principal Problem:   Debility  Past Medical History:  Past Medical History:  Diagnosis Date   Atrial fibrillation [facility])    Hyperthyroidism    Mitral regurgitation    NSTEMI (non-ST elevated myocardial infarction) [facility])    Past Surgical History:  Past Surgical History:  Procedure Laterality Date   ECMO CANNULATION N/A 07/10/2023   Procedure: ECMO CANNULATION;  Surgeon: Dolores Patty, MD;  Location: [facility] INVASIVE CV LAB;  Service: Cardiovascular;  Laterality: N/A;   EMBOLECTOMY Left 07/18/2023   Procedure: EMBOLECTOMY Left Femoral, Popliteal and iliac arterys,  Repair Left common femoral artery.;  Surgeon: Victorino Sparrow, MD;  Location: [facility] OR;  Service: Vascular;  Laterality: Left;   INTRAOPERATIVE TRANSESOPHAGEAL ECHOCARDIOGRAM N/A 07/23/2023   Procedure: ECHOCARDIOGRAM, TRANSESOPHAGEAL, INTRAOPERATIVE;  Surgeon: Loreli Slot, MD;  Location: [facility] OR;  Service: Open Heart Surgery;  Laterality: N/A;   LEFT HEART CATH AND CORONARY ANGIOGRAPHY N/A 10/22/2021   Procedure: LEFT HEART CATH AND CORONARY ANGIOGRAPHY;  Surgeon: Yvonne Kendall, MD;  Location: [facility] INVASIVE CV LAB;  Service: Cardiovascular;  Laterality: N/A;   PLACEMENT OF IMPELLA LEFT VENTRICULAR ASSIST DEVICE Right 07/14/2023   Procedure: PLACEMENT OF RIGHT AXILLARY IMPELLA 5.5 LEFT VENTRICULAR ASSIST DEVICE;  Surgeon: Loreli Slot, MD;  Location: [facility] OR;  Service: Open Heart Surgery;  Laterality: Right;   REMOVAL OF IMPELLA LEFT VENTRICULAR ASSIST DEVICE Right 07/14/2023   Procedure: REMOVAL OF CP RIGHT FEMORAL IMPELLA LEFT VENTRICULAR ASSIST DEVICE;  Surgeon: Loreli Slot, MD;  Location: [facility] OR;  Service: Open Heart Surgery;  Laterality: Right;   REMOVAL OF IMPELLA LEFT VENTRICULAR ASSIST DEVICE N/A 07/23/2023   Procedure:  REMOVAL, CARDIAC ASSIST DEVICE, IMPELLA;  Surgeon: Loreli Slot, MD;  Location: [facility] OR;  Service: Open Heart Surgery;  Laterality: N/A;   TRANSCATHETER MITRAL EDGE TO EDGE REPAIR N/A 07/16/2023   Procedure: TRANSCATHETER MITRAL EDGE TO EDGE REPAIR;  Surgeon: Tonny Bollman, MD;  Location: [facility] INVASIVE CV LAB;  Service: Cardiovascular;  Laterality: N/A;   TRANSESOPHAGEAL ECHOCARDIOGRAM (CATH LAB) N/A 07/16/2023   Procedure: TRANSESOPHAGEAL ECHOCARDIOGRAM;  Surgeon: Tonny Bollman, MD;  Location: [facility] INVASIVE CV LAB;  Service: Cardiovascular;  Laterality: N/A;   VENTRICULAR ASSIST DEVICE INSERTION N/A 07/10/2023   Procedure: VENTRICULAR ASSIST DEVICE INSERTION;  Surgeon: Dolores Patty, MD;  Location: [facility] INVASIVE CV LAB;  Service: Cardiovascular;  Laterality: N/A;   Social History:  reports that he has never smoked. He has never used smokeless tobacco. He reports current drug use. Frequency: 3.00 times per week. Drug: Marijuana. He reports that he does not drink alcohol.  Family / Support Systems Marital Status: Single Patient Roles: Partner, Other (Comment) (Sibling/employee) Spouse/Significant Other: Aaron [name] [phone] Other Supports: Jessica-sister [phone] Anticipated Caregiver: GF and sister Ability/Limitations of Caregiver: GF if need be can work from home and sister's job is flexible. Pt hopes he can stay home alone and do for himself Caregiver Availability: 24/7 (short term) Family Dynamics: Close with Aaron [name] and his sister along with multiple friends. 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 GF has been staying with him and providing support   Abuse/Neglect Abuse/Neglect Assessment Can Be Completed: Yes Physical Abuse: Denies Verbal Abuse: Denies Sexual Abuse: Denies Exploitation of patient/patient's resources: Denies Self-Neglect: Denies  Patient response to: Social Isolation - How often do you feel lonely or isolated from those around you?: Rarely  Emotional Status Pt's affect, behavior and adjustment status: Pt is motivated to recover and regain his independence he is not one to ask for assist norwant it offered. He has always been independent and taken care of himself. He will need to follow up with MD's at [facility] to manage his medical issues Recent Psychosocial Issues: other health issues-was not seeing a MD prior to admission Psychiatric History: No hx-issues-with his young age would benefit from seeng neuro-psych while here Substance Abuse History: Marijuana-feels not an issue  Patient / Family Perceptions, Expectations & Goals Pt/Family understanding of illness & functional limitations: Pt and Aaron [name] can explain his health issues and reason for being in the hospital. Both talk with the MD's rounding and feel they understand his treatment plan moving forward. Premorbid pt/family roles/activities: SO, brother, friend, Paediatric nurse, etc Anticipated changes in roles/activities/participation: resume Pt/family expectations/goals: Pt states: " I hope to be able to get back to work and do for myself."  Aaron [name] states: " I know with how stubborn he is he will do well here."  [employer]: None Premorbid Home Care/DME Agencies: None Transportation available at discharge: self and Aaron [name] Is the patient able to respond to transportation needs?: Yes In the past 12 months,  has lack of transportation kept you from medical appointments or from getting medications?: No In the past 12 months, has lack of transportation kept you from meetings, work, or from getting things needed for daily living?: No Resource referrals recommended: Neuropsychology  Discharge Planning Living Arrangements: Spouse/significant other Support Systems: Spouse/significant other, Other relatives, Friends/neighbors Type of Residence: Private residence Insurance Resources: Media planner (specify) (Well care medicaid) Financial Resources: Employment, Other (Comment) Financial Screen Referred: No Living Expenses: Psychologist, sport and exercise Management: Patient, Significant Other Does the patient have any problems obtaining your medications?: No (wasn't going to a MD) Home Management: both Patient/Family Preliminary Plans: Return home with Aaron [name] who can work from home if needed, his sister has a flexible job also. Will await teams' evaluations and work on discharge needs. Being evaluated today and goals being set for stay here. Care Coordinator Barriers to Discharge: Insurance for SNF coverage Care Coordinator Anticipated Follow Up Needs: HH/OP  Clinical Impression Pleasant gentleman who is motivated to do well here and his girlfriend-[name] is also present. She is a strong support of him. Will await team's evaluations and work on discharge needs. Will place on neuro-psych to be seen on Monday  Lucy Chris 08/05/2023, 9:18 AM

## 2023-08-05 NOTE — Progress Notes (Addendum)
 Inpatient Rehabilitation Admission Medication Review by a Pharmacist  A complete drug regimen review was completed for this patient to identify any potential clinically significant medication issues.  High Risk Drug Classes Is patient taking? Indication by Medication  Antipsychotic No   Anticoagulant Yes Heparin infusion - pulmonary embolus, CVA   Antibiotic No   Opioid Yes PRN Oxycodone - moderate pain  Antiplatelet Yes Aspirin 81 mg - CAD  Hypoglycemics/insulin Yes SSI - glucose control  Vasoactive Medication Yes Amiodarone - atrial fibrillation  Chemotherapy No   Other Yes Arformoterol, budesonide, yupelri nebs - hypoxia Darbepoetin - anemia of AKI/CKD/HD-dependent Methimazole - hyperthyroidism Vitamin C, folate, thiamine, renal vitamin, zinc (x 14 days) - supplements  PRNs: Acetaminophen - fever Lactulose, miralax - constipation     Type of Medication Issue Identified Description of Issue Recommendation(s)  Drug Interaction(s) (clinically significant)     Duplicate Therapy     Allergy     No Medication Administration End Date  HF team notes plan to stop amiodarone at discharge. Stop amiodarone at discharge unless HF/Cardiology plan changes.  Incorrect Dose     Additional Drug Therapy Needed     Significant med changes from prior encounter (inform family/care partners about these prior to discharge). Methimazole frequency decreased fro 8h to daily due to low TSH. Plan recheck in 4 wks.  Discontinued: metoprolol and spironolactone. Apixaban also discontinued but plan to resume later.  All other meds are new. Communicate changes with patient/family prior to discharge.  Other       Clinically significant medication issues were identified that warrant physician communication and completion of prescribed/recommended actions by midnight of the next day:  No  Pharmacist comments:  - noted plan to transition from IV heparin to Apixaban after TDC placement.  - noted plan  per HF team to stop amiodarone prior to discharge.  Time spent performing this drug regimen review (minutes):  20   Dennie Fetters, [place] 08/05/2023 8:33 AM

## 2023-08-05 NOTE — Progress Notes (Signed)
 Initial Nutrition Assessment  DOCUMENTATION CODES:   Non-severe (moderate) malnutrition in context of acute illness/injury  INTERVENTION:   Renal diet education provided. Nepro Shake po once daily, each supplement provides 425 kcal and 19 grams protein. HS snack daily. Double protein portions with meals. Continue Renal MVI daily. Magic cup TID with meals, each supplement provides 290 kcal and 9 grams of protein.  NUTRITION DIAGNOSIS:   Moderate Malnutrition related to acute illness as evidenced by mild fat depletion, mild muscle depletion.  GOAL:   Patient will meet greater than or equal to 90% of their needs  MONITOR:   PO intake, Supplement acceptance, Skin  REASON FOR ASSESSMENT:   Other (Comment) (Verbal consult from RN for diet education)    ASSESSMENT:   38 yo male admitted to rehab with debility S/P acute hospitalization for PE, cardiac arrest, ECMO, CRRT. PMH includes NSTEMI, hyperthyroidism, mitral regurgitation, atrial fibrillation.  During acute hospitalization, patient was receiving TF via Cortrak tube. Cortrak tube has been removed and patient is now on a renal diet with 1500 ml fluid restriction. He consumed 90% of breakfast today.  Patient reports that since he was admitted to rehab, his diet was changed and there are a lot of foods that he cannot have now. RD provided Renal diet education handouts regarding potassium, sodium, phosphorus and fluid intake. We reviewed foods that are lower in potassium, sodium, and phosphorus.   Patient with increased nutrient needs for HD, wound healing, and recovery. He agreed to try Nepro shake supplements, HS snack daily, and double protein portions with meals. Will also continue magic cups with meals.   Labs reviewed. Na 132, phos 5.8 (3/21) CBG: 77-100-137  Medications reviewed and include vitamin C, aranesp, folic acid, novolog, Rena-vit, thiamine, zinc sulfate.  Weight history reviewed. Noted fluctuations in weight  PTA and since admission, likely r/t fluid status with CHF and renal failure on iHD.   NUTRITION - FOCUSED PHYSICAL EXAM:  Flowsheet Row Most Recent Value  Orbital Region Mild depletion  Upper Arm Region No depletion  Thoracic and Lumbar Region Mild depletion  Buccal Region Mild depletion  Temple Region Mild depletion  Clavicle Bone Region Mild depletion  Clavicle and Acromion Bone Region Mild depletion  Scapular Bone Region Mild depletion  Dorsal Hand No depletion  Patellar Region Mild depletion  Anterior Thigh Region Mild depletion  Posterior Calf Region Mild depletion  Edema (RD Assessment) None  Hair Reviewed  Eyes Reviewed  Mouth Reviewed  Skin Reviewed  Nails Reviewed       Diet Order:   Diet Order             Diet renal with fluid restriction Fluid restriction: 1500 mL Fluid; Room service appropriate? Yes; Fluid consistency: Thin  Diet effective now                   EDUCATION NEEDS:   Education needs have been addressed  Skin:  Skin Assessment: Skin Integrity Issues: Skin Integrity Issues:: Stage II, Incisions Stage II: sacrum Incisions: L groin, R axilla  Last BM:  3/25 type 4  Height:   Ht Readings from Last 1 Encounters:  08/04/23 6\' 4"  (1.93 m)    Weight:   Wt Readings from Last 1 Encounters:  08/05/23 86.5 kg    Ideal Body Weight:  91.8 kg  BMI:  Body mass index is 23.21 kg/m.  Estimated Nutritional Needs:   Kcal:  2700-3000  Protein:  140-160 gm  Fluid:  1 L +  UOP    Gabriel Rainwater RD, LDN, CNSC Contact via secure chat. If unavailable, use group chat "RD Inpatient."

## 2023-08-05 NOTE — Evaluation (Signed)
 Physical Therapy Assessment and Plan  Patient Details  Name: Aaron [name] MRN: [id number] Date of Birth: Jul 18, 1985  PT Diagnosis: Abnormal posture, Abnormality of gait, Difficulty walking, Impaired cognition, Impaired sensation, and Muscle weakness Rehab Potential: Good ELOS: 12-14 days   Today's Date: 08/05/2023 PT Individual Time: 1300-1411 PT Individual Time Calculation (min): 71 min    Hospital Problem: Principal Problem:   Debility Active Problems:   Normocytic anemia   Nausea   Insomnia   Atrial fibrillation (HCC)   Malnutrition of moderate degree   Past Medical History:  Past Medical History:  Diagnosis Date   Atrial fibrillation (HCC)    Hyperthyroidism    Mitral regurgitation    NSTEMI (non-ST elevated myocardial infarction) [facility])    Past Surgical History:  Past Surgical History:  Procedure Laterality Date   ECMO CANNULATION N/A 07/10/2023   Procedure: ECMO CANNULATION;  Surgeon: Dolores Patty, MD;  Location: [facility] INVASIVE CV LAB;  Service: Cardiovascular;  Laterality: N/A;   EMBOLECTOMY Left 07/18/2023   Procedure: EMBOLECTOMY Left Femoral, Popliteal and iliac arterys,  Repair Left common femoral artery.;  Surgeon: Victorino Sparrow, MD;  Location: [facility] OR;  Service: Vascular;  Laterality: Left;   INTRAOPERATIVE TRANSESOPHAGEAL ECHOCARDIOGRAM N/A 07/23/2023   Procedure: ECHOCARDIOGRAM, TRANSESOPHAGEAL, INTRAOPERATIVE;  Surgeon: Loreli Slot, MD;  Location:  Hospital OR;  Service: Open Heart Surgery;  Laterality: N/A;   LEFT HEART CATH AND CORONARY ANGIOGRAPHY N/A 10/22/2021   Procedure: LEFT HEART CATH AND CORONARY ANGIOGRAPHY;  Surgeon: Yvonne Kendall, MD;  Location: [facility] INVASIVE CV LAB;  Service: Cardiovascular;  Laterality: N/A;   PLACEMENT OF IMPELLA LEFT VENTRICULAR ASSIST DEVICE Right 07/14/2023   Procedure: PLACEMENT OF RIGHT AXILLARY IMPELLA 5.5 LEFT VENTRICULAR ASSIST DEVICE;  Surgeon: Loreli Slot, MD;  Location: [facility] OR;  Service: Open Heart  Surgery;  Laterality: Right;   REMOVAL OF IMPELLA LEFT VENTRICULAR ASSIST DEVICE Right 07/14/2023   Procedure: REMOVAL OF CP RIGHT FEMORAL IMPELLA LEFT VENTRICULAR ASSIST DEVICE;  Surgeon: Loreli Slot, MD;  Location: [facility] OR;  Service: Open Heart Surgery;  Laterality: Right;   REMOVAL OF IMPELLA LEFT VENTRICULAR ASSIST DEVICE N/A 07/23/2023   Procedure: REMOVAL, CARDIAC ASSIST DEVICE, IMPELLA;  Surgeon: Loreli Slot, MD;  Location: [facility] OR;  Service: Open Heart Surgery;  Laterality: N/A;   TRANSCATHETER MITRAL EDGE TO EDGE REPAIR N/A 07/16/2023   Procedure: TRANSCATHETER MITRAL EDGE TO EDGE REPAIR;  Surgeon: Tonny Bollman, MD;  Location: [facility] INVASIVE CV LAB;  Service: Cardiovascular;  Laterality: N/A;   TRANSESOPHAGEAL ECHOCARDIOGRAM (CATH LAB) N/A 07/16/2023   Procedure: TRANSESOPHAGEAL ECHOCARDIOGRAM;  Surgeon: Tonny Bollman, MD;  Location: [facility] INVASIVE CV LAB;  Service: Cardiovascular;  Laterality: N/A;   VENTRICULAR ASSIST DEVICE INSERTION N/A 07/10/2023   Procedure: VENTRICULAR ASSIST DEVICE INSERTION;  Surgeon: Dolores Patty, MD;  [facility]: [facility] CV LAB;  Service: Cardiovascular;  Laterality: N/A;    Assessment & Plan Clinical Impression: Patient is a 38 y.o. year old male with history significant for PAF not adherent to Eliquis, chronic diastolic congestive heart failure, severe mitral regurgitation, hyperthyroidism. Per chart review patient lives with girlfriend. 1 level apartment. 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 Presented 07/09/2023 with left side chest pain radiating to his back and dyspnea x 4 days. Noted increased pain with inspiration. He did report some radiating pain down his left arm and numbness with tingling sensation. Noted blood pressure 138/108 and oxygen saturations 100%. CT angiogram of the chest showed segmental bilateral lower lobe  pulmonary emboli. No evidence for right heart strain. Patchy groundglass opacities in the inferior left  lower lobe worrisome for infarct. New mediastinal and bilateral hilar lymphadenopathy. Admission chemistries unremarkable except CO2 of 20, troponin 64-56., BNP 703.7, D-dimer 1.83, TSH less than 0.0100 and free T4 2.15. Intravenous heparin was initiated. Hospital course complicated by PEA arrest/A-fib with RVR and ROSC after 40 minutes status post TNK/ECMO and followed by heart failure team. Cardiac rate remains controlled maintained on amiodarone 200 mg daily. Latest echocardiogram showed ejection fraction of 40 to 45% left ventricle showing mildly decreased function. Postarrest course complicated by ATN requiring CRRT transition to hemodialysis. He was decannulated 3/8 also requiring Impella eventually removed 3/12. He initially failed extubation x 1 due to mucous plugging but eventually was extubated 3/16. 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 Patient currently remains on intravenous heparin for pulmonary emboli plan is to switch to Eliquis after [facility] HD catheter placed. He has had some elevated LFTs/transaminitis likely secondary to shock liver and monitored. Currently on a regular consistency die with Cortrak removed 3/22t with swallow study pending results 08/04/2023. Palliative care continue to follow to establish goals of care. Therapy evaluations completed due to patient decreased functional mobility/debility was admitted for a comprehensive rehab program .    Patient currently requires min with mobility secondary to muscle weakness, decreased cardiorespiratoy endurance, unbalanced muscle activation and decreased coordination, decreased problem solving and decreased memory, and decreased standing balance and decreased balance strategies.  Prior to hospitalization, patient was independent  with mobility and lived with Significant other ([name]-fiance) in a Apartment home.  Home access is  Level entry.  Patient will benefit from skilled PT intervention to maximize safe  functional mobility, minimize fall risk, and decrease caregiver burden for planned discharge home with intermittent supervision. Anticipate patient will benefit from follow up OP at discharge.  PT - End of Session Activity Tolerance: Tolerates 30+ min activity with multiple rests Endurance Deficit: Yes PT Assessment Rehab Potential (ACUTE/IP ONLY): Good PT Barriers to Discharge: Hemodialysis;Decreased caregiver support;Home environment access/layout;Lack of/limited family support PT Barriers to Discharge Comments: dizziness, endurance PT Patient demonstrates impairments in the following area(s): Balance;Endurance;Motor;Safety;Sensory PT Transfers Functional Problem(s): Bed Mobility;Bed to Chair;Car PT Locomotion Functional Problem(s): Ambulation;Wheelchair Mobility PT Plan PT Intensity: Minimum of 1-2 x/day ,45 to 90 minutes PT Frequency: 5 out of 7 days PT Duration Estimated Length of Stay: 12-14 days PT Treatment/Interventions: Community reintegration;Ambulation/gait training;DME/adaptive equipment instruction;Neuromuscular re-education;Psychosocial support;Stair training;UE/LE Strength taining/ROM;Wheelchair propulsion/positioning;Balance/vestibular training;Discharge planning;Functional electrical stimulation;Skin care/wound management;Pain management;Therapeutic Activities;UE/LE Coordination activities;Cognitive remediation/compensation;Disease management/prevention;Functional mobility training;Patient/family education;Splinting/orthotics;Therapeutic Exercise;Visual/perceptual remediation/compensation PT Transfers Anticipated Outcome(s): mod I PT Locomotion Anticipated Outcome(s): mod I PT Recommendation Recommendations for Other Services: Neuropsych consult;Therapeutic Recreation consult Therapeutic Recreation Interventions: Stress management;Outing/community reintergration Follow Up Recommendations: Outpatient PT;Home health PT Patient destination: Home Equipment Recommended: To be  determined   PT Evaluation Precautions/Restrictions Precautions Precautions: Fall Precaution/Restrictions Comments: Monitor vitals; HD MWF; Painful L ankle fx from 2010, mild L hemiparesis Restrictions Weight Bearing Restrictions Per Provider Order: No Pain Interference Pain Interference Pain Effect on Sleep: 0. Does not apply - I have not had any pain or hurting in the past 5 days Pain Interference with Therapy Activities: 0. Does not apply - I have not received rehabilitationtherapy in the past 5 days Pain Interference with Day-to-Day Activities: 1. Rarely or not at all Home Living/Prior Functioning Home Living Available Help at Discharge: Available PRN/intermittently (SO works full time) Type of Home: Apartment Home Access: Level  entry Home Layout: One level Bathroom Shower/Tub: Engineer, manufacturing systems: Standard Bathroom Accessibility: Yes  Lives With: Significant other ([name]-fiance) Prior Function Level of Independence: Independent with basic ADLs;Independent with homemaking with ambulation;Independent with gait;Independent with transfers  Able to Take Stairs?: Yes Driving: Yes Vocation: Full time employment Vocation Requirements: [employer] Vision/Perception  Vision - History Ability to See in Adequate Light: 0 Adequate Perception Perception: Within Functional Limits Praxis Praxis: WFL  Cognition Overall Cognitive Status: Impaired/Different from baseline Arousal/Alertness: Awake/alert Orientation Level: Oriented X4 Year: 2025 Month: March Day of Week: Correct Attention: Sustained Sustained Attention: Appears intact Memory: Impaired Memory Impairment: Decreased short term memory Decreased Short Term Memory: Verbal basic;Functional basic Awareness: Appears intact Problem Solving: Impaired Problem Solving Impairment: Verbal basic;Functional basic Safety/Judgment: Appears intact Sensation Sensation Light Touch: Impaired Detail Peripheral sensation  comments: Reports of tingling in BUE/BLE. Light Touch Impaired Details: Impaired RUE;Impaired LUE;Impaired RLE;Impaired LLE Coordination Gross Motor Movements are Fluid and Coordinated: No Fine Motor Movements are Fluid and Coordinated: No Coordination and Movement Description: Deficits due to generalized debility and B-hand tremors, and mild L hemiparesis . Motor  Motor Motor: Other (comment) Motor - Skilled Clinical Observations: Deficits due to generalized debility and B-hand tremors.   Trunk/Postural Assessment  Cervical Assessment Cervical Assessment: Exceptions to [facility] (forward head) Thoracic Assessment Thoracic Assessment: Exceptions to [facility] (rounded shoulders) Lumbar Assessment Lumbar Assessment: Exceptions to [facility] (posterior pelvic tilt) Postural Control Postural Control: Deficits on evaluation Righting Reactions: Delayed Protective Responses: Delayed  Balance Balance Balance Assessed: Yes Static Sitting Balance Static Sitting - Balance Support: Feet supported Static Sitting - Level of Assistance: 5: Stand by assistance (supervision) Static Standing Balance Static Standing - Level of Assistance: 5: Stand by assistance;4: Min assist Dynamic Standing Balance Dynamic Standing - Level of Assistance: 5: Stand by assistance;4: Min assist Extremity Assessment  RUE Assessment RUE Assessment: Exceptions to  [facility] Active Range of Motion (AROM) Comments: WFL but painful General Strength Comments: 3-/5 LUE Assessment LUE Assessment: Exceptions to [facility] Active Range of Motion (AROM) Comments: WFL but painful General Strength Comments: 3-/5 RLE Assessment RLE Assessment: Exceptions to [facility] General Strength Comments: grossly 4/5 LLE Assessment LLE Assessment: Exceptions to [facility] General Strength Comments: grossly 4-/5  Care Tool Care Tool Bed Mobility Roll left and right activity   Roll left and right assist level: Supervision/Verbal cueing    Sit to lying activity   Sit to lying  assist level: Supervision/Verbal cueing    Lying to sitting on side of bed activity   Lying to sitting on side of bed assist level: the ability to move from lying on the back to sitting on the side of the bed with no back support.: Supervision/Verbal cueing     Care Tool Transfers Sit to stand transfer   Sit to stand assist level: Contact Guard/Touching assist    Chair/bed transfer   Chair/bed transfer assist level: Minimal Assistance - Patient > 75%    Car transfer   Car transfer assist level: Minimal Assistance - Patient > 75%      Care Tool Locomotion Ambulation   Assist level: Minimal Assistance - Patient > 75% Assistive device: Walker-rolling Max distance: 55  Walk 10 feet activity   Assist level: Minimal Assistance - Patient > 75% Assistive device: Walker-rolling   Walk 50 feet with 2 turns activity   Assist level: Minimal Assistance - Patient > 75% Assistive device: Walker-rolling  Walk 150 feet activity Walk 150 feet activity did not occur: Safety/medical concerns (fatigue)  Walk 10 feet on uneven surfaces activity Walk 10 feet on uneven surfaces activity did not occur: Safety/medical concerns (fatigue)      Stairs Stair activity did not occur: Safety/medical concerns (fatigue)        Walk up/down 1 step activity Walk up/down 1 step or curb (drop down) activity did not occur: Safety/medical concerns      Walk up/down 4 steps activity Walk up/down 4 steps activity did not occur: Safety/medical concerns      Walk up/down 12 steps activity Walk up/down 12 steps activity did not occur: Safety/medical concerns      Pick up small objects from floor   Pick up small object from the floor assist level: Total Assistance - Patient < 25%    Wheelchair Is the patient using a wheelchair?: Yes Type of Wheelchair: Manual   Wheelchair assist level: Dependent - Patient 0%    Wheel 50 feet with 2 turns activity   Assist Level: Dependent - Patient 0%  Wheel 150 feet  activity   Assist Level: Dependent - Patient 0%    Refer to Care Plan for Long Term Goals  SHORT TERM GOAL WEEK 1 PT Short Term Goal 1 (Week 1): Pt will ambulate 150 feet with LRAD and supervision PT Short Term Goal 2 (Week 1): pt will perform outcome measure assessment PT Short Term Goal 3 (Week 1): pt will perform stand pivot transfer with LRAD and supervision  Recommendations for other services: Neuropsych and Therapeutic Recreation  Stress management and Outing/community reintegration  Skilled Therapeutic Intervention Mobility Bed Mobility Bed Mobility: Supine to Sit;Sit to Supine;Rolling Left;Rolling Right Rolling Right: Supervision/verbal cueing Rolling Left: Supervision/Verbal cueing Supine to Sit: Supervision/Verbal cueing Sit to Supine: Supervision/Verbal cueing Transfers Transfers: Sit to Stand;Stand to Sit;Stand Pivot Transfers Sit to Stand: Contact Guard/Touching assist Stand to Sit: Minimal Assistance - Patient > 75% Stand Pivot Transfers: Minimal Assistance - Patient > 75% Stand Pivot Transfer Details: Verbal cues for technique;Verbal cues for precautions/safety Transfer (Assistive device): None Locomotion  Gait Ambulation: Yes Gait Assistance: 2 Helpers;Minimal Assistance - Patient > 75% (+2 min A for WC follow/IV management) Gait Distance (Feet): 55 Feet Assistive device: None Gait Assistance Details: Verbal cues for gait pattern;Verbal cues for safe use of DME/AE;Verbal cues for precautions/safety Gait Gait: Yes Gait Pattern: Impaired Gait Pattern: Decreased stride length;Decreased stance time - left;Decreased hip/knee flexion - left;Decreased hip/knee flexion - right;Lateral trunk lean to right;Poor foot clearance - left;Poor foot clearance - right Gait velocity: decr Stairs / Additional Locomotion Stairs: No Wheelchair Mobility Wheelchair Mobility: Yes Wheelchair Assistance: Dependent - Patient 0%   Discharge Criteria: Patient will be discharged from  PT if patient refuses treatment 3 consecutive times without medical reason, if treatment goals not met, if there is a change in medical status, if patient makes no progress towards goals or if patient is discharged from hospital.  The above assessment, treatment plan, treatment alternatives and goals were discussed and mutually agreed upon: by patient  Today's interventions  Evaluation completed (see details above and below) with education on PT POC and goals and individual treatment initiated with focus on transfer training.   Pt reports feeling better this afternoon in comparison to this AM.   Pt reports intermittent dizziness with sitting EOB, and standing intitially however pt reports decreases with prolonged positioning. Pt denies any dizziness with gait.   Vitals assessed:  Sitting EOB: 125/73 HR 87  Standing: 129/81 HR 83 Post gait: 127/86 HR 99   Pt currently  only has slides for shoes, recommended pt have fiance bring in a pair of tennis shoes.   Bed mobility: supervision Sit to stand CGA/supervision, stand to sit CGA/min A Stand pivot transfer CGA/min A Gait 2x55 feet with no AD and +2 for WC follow and IV management, pt demos B LE shaking and lateral trunk lean to R, with decreased stride length and decreased foot clearance B L LE>R LE with fatigue.   Pt performed ambulatory transfer to bathroom with min A with no AD. Pt continent of bowel and bladder. Pt performed pericare with mod I while seated. Pt donned pants with CGA.   Pt supine in bed with all needs within reach and bed alarm on.    [facility] [place], [place], DPT  08/05/2023, 4:22 PM

## 2023-08-05 NOTE — Progress Notes (Signed)
 [facility] Progress Note  Name: JAMICAH ANSTEAD MRN: [id number] DOB: 1985-08-31   Subjective:  He was transferred to CIR over 3/24.  Strict ins/outs are not available.  He had 950 mL UOP over 3/24 and 1 unmeasured urine void.  He has had 600 mL UOP as well as 4 unmeasured urine voids.  Last HD on 3/24 with no UF per my order.    Review of systems:    Denies shortness of breath or chest pain  Denies n/v   Intake/Output Summary (Last 24 hours) at 08/05/2023 1733 Last data filed at 08/05/2023 1306 Gross per 24 hour  Intake 542.15 ml  Output 1550 ml  Net -1007.85 ml    Vitals:  Vitals:   08/05/23 0509 08/05/23 0602 08/05/23 0743 08/05/23 1305  BP: (!) 136/100 127/78  127/83  Pulse: 81 80  88  Resp: 19   18  Temp: 98.2 F (36.8 C)   98.3 F (36.8 C)  TempSrc:    Oral  SpO2: 100%  99% 100%  Weight:  86.5 kg    Height:         Physical Exam:   General adult male in bed in no acute distress HEENT normocephalic atraumatic extraocular movements intact sclera anicteric Neck supple trachea midline Lungs clear to auscultation bilaterally normal work of breathing at rest  Heart S1S2 no rub Abdomen soft nontender nondistended Extremities no edema  Psych normal mood and affect Neuro - alert and oriented x 3 provides hx and follows commands Access left chest nontunneled dialysis catheter   Medications reviewed   Labs:     Latest Ref Rng & Units 08/05/2023    5:00 AM 08/04/2023    4:25 AM 08/03/2023    4:01 AM  BMP  Glucose 70 - 99 mg/dL 92  95  91   BUN 6 - 20 mg/dL 65  409  94   Creatinine 0.61 - 1.24 mg/dL 8.11  91.47  8.29   Sodium 135 - 145 mmol/L 132  130  130   Potassium 3.5 - 5.1 mmol/L 4.5  5.5  5.5   Chloride 98 - 111 mmol/L 94  95  94   CO2 22 - 32 mmol/L 23  22  21    Calcium 8.9 - 10.3 mg/dL 7.9  7.9  8.2      Assessment/Plan:   Pt is a 38 y.o. yo male  likely ATN after cardiac arrest, cardiogenic shock on ECMO ( decan 3/8) and Impella (  removed 3/12), AHRF/VDRF    # Dialysis dependent anuric AKI 2/2 ATN from cardiogenic shock on CRRT 07/11/23 - 07/26/23, Left subclavian temp HD cath placed by CCM; note interruptions because of procedures, imaging studies as well as clotting. Transitioned to [facility] on 3/17.   - Improving urine output - hopeful for recovery  - Assess HD needs daily.  Possible HD tomorrow  -ordered strict ins/outs - ordered renal panel daily - continue with temporary dialysis catheter for now  - on the heparin gtt for now while we sort out whether or not he will need a tunneled catheter.   # Cardiogenic Shock,  Impella removed; status post transcutaneous mitral valve repair; previously on ECMO. Cardiology supportive of transition to rehab.   # VT + PEA SCA, as per cardiology  # PE - on heparin per primary team (once we know if he needs another procedure would do eliquis per cardiology/medicine inpatient team previous plans)  # Normocytic Anemia -  on  aranesp 200 mcg weekly on Fridays.  Stable   # Hyperkalemia - on HD.  Changed to renal diet   # Severe MR s/p TEER 3/5  Disposition continue inpatient monitoring (in rehab now)  Estanislado Emms, MD 08/05/2023 5:45 PM

## 2023-08-05 NOTE — Plan of Care (Signed)
  Problem: RH Balance Goal: LTG Patient will maintain dynamic sitting balance (PT) Description: LTG:  Patient will maintain dynamic sitting balance with assistance during mobility activities (PT) Flowsheets (Taken 08/05/2023 1630) LTG: Pt will maintain dynamic sitting balance during mobility activities with:: Independent Goal: LTG Patient will maintain dynamic standing balance (PT) Description: LTG:  Patient will maintain dynamic standing balance with assistance during mobility activities (PT) Flowsheets (Taken 08/05/2023 1630) LTG: Pt will maintain dynamic standing balance during mobility activities with:: Independent with assistive device    Problem: Sit to Stand Goal: LTG:  Patient will perform sit to stand with assistance level (PT) Description: LTG:  Patient will perform sit to stand with assistance level (PT) Flowsheets (Taken 08/05/2023 1630) LTG: PT will perform sit to stand in preparation for functional mobility with assistance level: Independent   Problem: RH Bed Mobility Goal: LTG Patient will perform bed mobility with assist (PT) Description: LTG: Patient will perform bed mobility with assistance, with/without cues (PT). Flowsheets (Taken 08/05/2023 1630) LTG: Pt will perform bed mobility with assistance level of: Independent   Problem: RH Bed to Chair Transfers Goal: LTG Patient will perform bed/chair transfers w/assist (PT) Description: LTG: Patient will perform bed to chair transfers with assistance (PT). Flowsheets (Taken 08/05/2023 1630) LTG: Pt will perform Bed to Chair Transfers with assistance level: Independent   Problem: RH Car Transfers Goal: LTG Patient will perform car transfers with assist (PT) Description: LTG: Patient will perform car transfers with assistance (PT). Flowsheets (Taken 08/05/2023 1630) LTG: Pt will perform car transfers with assist:: Set up assist    Problem: RH Furniture Transfers Goal: LTG Patient will perform furniture transfers w/assist  (OT/PT) Description: LTG: Patient will perform furniture transfers  with assistance (OT/PT). Flowsheets (Taken 08/05/2023 1630) LTG: Pt will perform furniture transfers with assist:: Independent with assistive device    Problem: RH Ambulation Goal: LTG Patient will ambulate in controlled environment (PT) Description: LTG: Patient will ambulate in a controlled environment, # of feet with assistance (PT). Flowsheets (Taken 08/05/2023 1630) LTG: Pt will ambulate in controlled environ  assist needed:: Independent with assistive device LTG: Ambulation distance in controlled environment: 150 feet with LRAD Goal: LTG Patient will ambulate in home environment (PT) Description: LTG: Patient will ambulate in home environment, # of feet with assistance (PT). Flowsheets (Taken 08/05/2023 1630) LTG: Pt will ambulate in home environ  assist needed:: Independent with assistive device LTG: Ambulation distance in home environment: 75 feet with LRAD Goal: LTG Patient will ambulate in community environment (PT) Description: LTG: Patient will ambulate in community environment, # of feet with assistance (PT). Flowsheets (Taken 08/05/2023 1630) LTG: Pt will ambulate in community environ  assist needed:: Supervision/Verbal cueing LTG: Ambulation distance in community environment: 75 feet with LRAD

## 2023-08-05 NOTE — Progress Notes (Signed)
 Inpatient Rehabilitation Center Individual Statement of Services  Patient Name:  Aaron [name]  Date:  08/05/2023  Welcome to the Inpatient Rehabilitation Center.  Our goal is to provide you with an individualized program based on your diagnosis and situation, designed to meet your specific needs.  With this comprehensive rehabilitation program, you will be expected to participate in at least 3 hours of rehabilitation therapies Monday-Friday, with modified therapy programming on the weekends.  Your rehabilitation program will include the following services:  Physical Therapy (PT), Occupational Therapy (OT), Speech Therapy (ST), 24 hour per day rehabilitation nursing, Therapeutic Recreaction (TR), Neuropsychology, Care Coordinator, Rehabilitation Medicine, Nutrition Services, and Pharmacy Services  Weekly team conferences will be held on Wednesday to discuss your progress.  Your Inpatient Rehabilitation Care Coordinator will talk with you frequently to get your input and to update you on team discussions.  Team conferences with you and your family in attendance may also be held.  Expected length of stay: 12-14 days  Overall anticipated outcome: mod/I level  Depending on your progress and recovery, your program may change. Your Inpatient Rehabilitation Care Coordinator will coordinate services and will keep you informed of any changes. Your Inpatient Rehabilitation Care Coordinator's name and contact numbers are listed  below.  The following services may also be recommended but are not provided by the Inpatient Rehabilitation Center:  Driving Evaluations Home Health Rehabiltiation Services Outpatient Rehabilitation Services Vocational Rehabilitation   Arrangements will be made to provide these services after discharge if needed.  Arrangements include referral to agencies that provide these services.  Your insurance has been verified to be:  [employer] medicaid Your primary doctor is:   None  Pertinent information will be shared with your doctor and your insurance company.  Inpatient Rehabilitation Care Coordinator:  Dossie Der, [place] [phone] or Luna Glasgow  Information discussed with and copy given to patient by: Lucy Chris, 08/05/2023, 9:20 AM

## 2023-08-05 NOTE — Plan of Care (Signed)
 Problem: RH Balance Goal: LTG Patient will maintain dynamic standing with ADLs (OT) Description: LTG:  Patient will maintain dynamic standing balance with assist during activities of daily living (OT)  Flowsheets (Taken 08/05/2023 1250) LTG: Pt will maintain dynamic standing balance during ADLs with: Independent with assistive device   Problem: Sit to Stand Goal: LTG:  Patient will perform sit to stand in prep for activites of daily living with assistance level (OT) Description: LTG:  Patient will perform sit to stand in prep for activites of daily living with assistance level (OT) Flowsheets (Taken 08/05/2023 1250) LTG: PT will perform sit to stand in prep for activites of daily living with assistance level: Independent with assistive device   Problem: RH Eating Goal: LTG Patient will perform eating w/assist, cues/equip (OT) Description: LTG: Patient will perform eating with assist, with/without cues using equipment (OT) Flowsheets (Taken 08/05/2023 1250) LTG: Pt will perform eating with assistance level of: Independent with assistive device    Problem: RH Grooming Goal: LTG Patient will perform grooming w/assist,cues/equip (OT) Description: LTG: Patient will perform grooming with assist, with/without cues using equipment (OT) Flowsheets (Taken 08/05/2023 1250) LTG: Pt will perform grooming with assistance level of: Independent with assistive device    Problem: RH Bathing Goal: LTG Patient will bathe all body parts with assist levels (OT) Description: LTG: Patient will bathe all body parts with assist levels (OT) Flowsheets (Taken 08/05/2023 1250) LTG: Pt will perform bathing with assistance level/cueing: Independent with assistive device    Problem: RH Dressing Goal: LTG Patient will perform upper body dressing (OT) Description: LTG Patient will perform upper body dressing with assist, with/without cues (OT). Flowsheets (Taken 08/05/2023 1250) LTG: Pt will perform upper body dressing  with assistance level of: Independent with assistive device Goal: LTG Patient will perform lower body dressing w/assist (OT) Description: LTG: Patient will perform lower body dressing with assist, with/without cues in positioning using equipment (OT) Flowsheets (Taken 08/05/2023 1250) LTG: Pt will perform lower body dressing with assistance level of: Independent with assistive device   Problem: RH Toileting Goal: LTG Patient will perform toileting task (3/3 steps) with assistance level (OT) Description: LTG: Patient will perform toileting task (3/3 steps) with assistance level (OT)  Flowsheets (Taken 08/05/2023 1250) LTG: Pt will perform toileting task (3/3 steps) with assistance level: Independent with assistive device   Problem: RH Functional Use of Upper Extremity Goal: LTG Patient will use RT/LT upper extremity as a (OT) Description: LTG: Patient will use right/left upper extremity as a stabilizer/gross assist/diminished/nondominant/dominant level with assist, with/without cues during functional activity (OT) Flowsheets (Taken 08/05/2023 1250) LTG: Use of upper extremity in functional activities:  RUE as dominant level  LUE as nondominant level LTG: Pt will use upper extremity in functional activity with assistance level of: Independent with assistive device   Problem: RH Simple Meal Prep Goal: LTG Patient will perform simple meal prep w/assist (OT) Description: LTG: Patient will perform simple meal prep with assistance, with/without cues (OT). Flowsheets (Taken 08/05/2023 1250) LTG: Pt will perform simple meal prep with assistance level of: Independent with assistive device   Problem: RH Light Housekeeping Goal: LTG Patient will perform light housekeeping w/assist (OT) Description: LTG: Patient will perform light housekeeping with assistance, with/without cues (OT). Flowsheets (Taken 08/05/2023 1250) LTG: Pt will perform light housekeeping with assistance level of: Independent with  assistive device   Problem: RH Toilet Transfers Goal: LTG Patient will perform toilet transfers w/assist (OT) Description: LTG: Patient will perform toilet transfers with assist, with/without cues  using equipment (OT) Flowsheets (Taken 08/05/2023 1250) LTG: Pt will perform toilet transfers with assistance level of: Independent with assistive device   Problem: RH Tub/Shower Transfers Goal: LTG Patient will perform tub/shower transfers w/assist (OT) Description: LTG: Patient will perform tub/shower transfers with assist, with/without cues using equipment (OT) Flowsheets (Taken 08/05/2023 1250) LTG: Pt will perform tub/shower stall transfers with assistance level of: Supervision/Verbal cueing

## 2023-08-05 NOTE — Plan of Care (Signed)
  Problem: RH Swallowing Goal: LTG Patient will consume least restrictive diet using compensatory strategies with assistance (SLP) Description: LTG:  Patient will consume least restrictive diet using compensatory strategies with assistance (SLP) Flowsheets (Taken 08/05/2023 1214) LTG: Pt Patient will consume least restrictive diet using compensatory strategies with assistance of (SLP): Modified Independent   Problem: RH Problem Solving Goal: LTG Patient will demonstrate problem solving for (SLP) Description: LTG:  Patient will demonstrate problem solving for basic/complex daily situations with cues  (SLP) Flowsheets (Taken 08/05/2023 1214) LTG: Patient will demonstrate problem solving for (SLP): (mildly complex) Other (comment) LTG Patient will demonstrate problem solving for: Modified Independent   Problem: RH Memory Goal: LTG Patient will use memory compensatory aids to (SLP) Description: LTG:  Patient will use memory compensatory aids to recall biographical/new, daily complex information with cues (SLP) Flowsheets (Taken 08/05/2023 1214) LTG: Patient will use memory compensatory aids to (SLP): Modified Independent

## 2023-08-05 NOTE — Evaluation (Signed)
 Occupational Therapy Assessment and Plan  Patient Details  Name: Aaron [name] MRN: [id number] Date of Birth: 10/15/1985  OT Diagnosis: acute pain, muscle weakness (generalized), pain in joint, and decreased activity tolerance Rehab Potential: Rehab Potential (ACUTE ONLY): Fair ELOS: 12-14 days   Today's Date: 08/05/2023 OT Individual Time: 0920-1030 OT Individual Time Calculation (min): 70 min     Hospital Problem: Principal Problem:   Debility   Past Medical History:  Past Medical History:  Diagnosis Date   Atrial fibrillation (HCC)    Hyperthyroidism    Mitral regurgitation    NSTEMI (non-ST elevated myocardial infarction) [facility])    Past Surgical History:  Past Surgical History:  Procedure Laterality Date   ECMO CANNULATION N/A 07/10/2023   Procedure: ECMO CANNULATION;  Surgeon: Dolores Patty, MD;  Location: [facility] INVASIVE CV LAB;  Service: Cardiovascular;  Laterality: N/A;   EMBOLECTOMY Left 07/18/2023   Procedure: EMBOLECTOMY Left Femoral, Popliteal and iliac arterys,  Repair Left common femoral artery.;  Surgeon: Victorino Sparrow, MD;  Location: [facility] OR;  Service: Vascular;  Laterality: Left;   INTRAOPERATIVE TRANSESOPHAGEAL ECHOCARDIOGRAM N/A 07/23/2023   Procedure: ECHOCARDIOGRAM, TRANSESOPHAGEAL, INTRAOPERATIVE;  Surgeon: Loreli Slot, MD;  Location: [facility] OR;  Service: Open Heart Surgery;  Laterality: N/A;   LEFT HEART CATH AND CORONARY ANGIOGRAPHY N/A 10/22/2021   Procedure: LEFT HEART CATH AND CORONARY ANGIOGRAPHY;  Surgeon: Yvonne Kendall, MD;  Location: [facility] INVASIVE CV LAB;  Service: Cardiovascular;  Laterality: N/A;   PLACEMENT OF IMPELLA LEFT VENTRICULAR ASSIST DEVICE Right 07/14/2023   Procedure: PLACEMENT OF RIGHT AXILLARY IMPELLA 5.5 LEFT VENTRICULAR ASSIST DEVICE;  Surgeon: Loreli Slot, MD;  Location: [facility] OR;  Service: Open Heart Surgery;  Laterality: Right;   REMOVAL OF IMPELLA LEFT VENTRICULAR ASSIST DEVICE Right 07/14/2023   Procedure: REMOVAL OF CP  RIGHT FEMORAL IMPELLA LEFT VENTRICULAR ASSIST DEVICE;  Surgeon: Loreli Slot, MD;  Location: [facility] OR;  Service: Open Heart Surgery;  Laterality: Right;   REMOVAL OF IMPELLA LEFT VENTRICULAR ASSIST DEVICE N/A 07/23/2023   Procedure: REMOVAL, CARDIAC ASSIST DEVICE, IMPELLA;  Surgeon: Loreli Slot, MD;  Location: [facility] OR;  Service: Open Heart Surgery;  Laterality: N/A;   TRANSCATHETER MITRAL EDGE TO EDGE REPAIR N/A 07/16/2023   Procedure: TRANSCATHETER MITRAL EDGE TO EDGE REPAIR;  Surgeon: Tonny Bollman, MD;  Location: [facility] INVASIVE CV LAB;  Service: Cardiovascular;  Laterality: N/A;   TRANSESOPHAGEAL ECHOCARDIOGRAM (CATH LAB) N/A 07/16/2023   Procedure: TRANSESOPHAGEAL ECHOCARDIOGRAM;  Surgeon: Tonny Bollman, MD;  Location: [facility] INVASIVE CV LAB;  Service: Cardiovascular;  Laterality: N/A;   VENTRICULAR ASSIST DEVICE INSERTION N/A 07/10/2023   Procedure: VENTRICULAR ASSIST DEVICE INSERTION;  Surgeon: Dolores Patty, MD;  Location: [facility] INVASIVE CV LAB;  Service: Cardiovascular;  Laterality: N/A;    Assessment & Plan Clinical Impression: Patient is a 38 year old right-handed male with history significant for PAF not adherent to Eliquis, chronic diastolic congestive heart failure, severe mitral regurgitation, hyperthyroidism. Per chart review patient lives with girlfriend. 1 level apartment. 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 Presented 07/09/2023 with left side chest pain radiating to his back and dyspnea x 4 days. Noted increased pain with inspiration. He did report some radiating pain down his left arm and numbness with tingling sensation. Noted blood pressure 138/108 and oxygen saturations 100%. CT angiogram of the chest showed segmental bilateral lower lobe pulmonary emboli. No evidence for right heart strain. Patchy groundglass opacities in the inferior left lower lobe worrisome for infarct. New mediastinal  and bilateral hilar lymphadenopathy. Admission chemistries unremarkable  except CO2 of 20, troponin 64-56., BNP 703.7, D-dimer 1.83, TSH less than 0.0100 and free T4 2.15. Intravenous heparin was initiated. Hospital course complicated by PEA arrest/A-fib with RVR and ROSC after 40 minutes status post TNK/ECMO and followed by heart failure team. Cardiac rate remains controlled maintained on amiodarone 200 mg daily. Latest echocardiogram showed ejection fraction of 40 to 45% left ventricle showing mildly decreased function. Postarrest course complicated by ATN requiring CRRT transition to hemodialysis. He was decannulated 3/8 also requiring Impella eventually removed 3/12. He initially failed extubation x 1 due to mucous plugging but eventually was extubated 3/16. 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 Patient currently remains on intravenous heparin for pulmonary emboli plan is to switch to Eliquis after [facility] HD catheter placed. He has had some elevated LFTs/transaminitis likely secondary to shock liver and monitored. Currently on a regular consistency die with Cortrak removed 3/22t with swallow study pending results 08/04/2023. Palliative care continue to follow to establish goals of care.  Patient transferred to CIR on 08/04/2023 .    Patient currently requires mod A with basic self-care skills (at bed-level) secondary to muscle weakness and muscle joint tightness, decreased cardiorespiratoy endurance, symptomatic positional changes, and decreased standing balance and decreased balance strategies.  Prior to hospitalization, patient could complete BADLs/IADLs independently.   Patient will benefit from skilled intervention to increase independence with basic self-care skills and increase level of independence with iADL prior to discharge home with care partner.  Anticipate patient will require intermittent supervision and follow up home health.  OT - End of Session Activity Tolerance: Tolerates < 10 min activity with changes in vital signs Endurance  Deficit: Yes Endurance Deficit Description: Unable to progress with OOB activity due to weakness/fatigue/nausea/dizziness. OT Assessment Rehab Potential (ACUTE ONLY): Fair OT Barriers to Discharge: Wound Care;Lack of/limited family support;Hemodialysis OT Patient demonstrates impairments in the following area(s): Balance;Edema;Endurance;Pain;Safety;Skin Integrity OT Basic ADL's Functional Problem(s): Eating;Bathing;Grooming;Dressing;Toileting OT Advanced ADL's Functional Problem(s): Simple Meal Preparation;Light Housekeeping OT Transfers Functional Problem(s): Toilet;Tub/Shower OT Additional Impairment(s): Fuctional Use of Upper Extremity OT Plan OT Intensity: Minimum of 1-2 x/day, 45 to 90 minutes OT Frequency: 5 out of 7 days OT Duration/Estimated Length of Stay: 12-14 days OT Treatment/Interventions: Metallurgist training;Community reintegration;Discharge planning;Disease mangement/prevention;DME/adaptive equipment instruction;Functional electrical stimulation;Patient/family education;Neuromuscular re-education;Functional mobility training;Pain management;Psychosocial support;Self Care/advanced ADL retraining;Skin care/wound managment;Therapeutic Activities;Therapeutic Exercise;UE/LE Strength taining/ROM;UE/LE Coordination activities OT Self Feeding Anticipated Outcome(s): Mod I OT Basic Self-Care Anticipated Outcome(s): Mod I OT Toileting Anticipated Outcome(s): Mod I OT Bathroom Transfers Anticipated Outcome(s): Mod I OT Recommendation Patient destination: Home Follow Up Recommendations: Home health OT Equipment Recommended: To be determined   OT Evaluation Precautions/Restrictions  Precautions Precautions: Fall Precaution/Restrictions Comments: Monitor vitals; HD MWF; Painful L ankle fx from 2010 Restrictions Weight Bearing Restrictions Per Provider Order: No General Chart Reviewed: Yes Family/Caregiver Present: No Vital Signs See flowsheets Pain Pain  Assessment Pain Scale: 0-10 Home Living/Prior Functioning Home Living Family/patient expects to be discharged to:: Private residence Living Arrangements: Spouse/significant other Available Help at Discharge: Available PRN/intermittently (S.O works full-time with some flexibility in hours.) Type of Home: Apartment Home Access: Level entry Home Layout: One level Bathroom Shower/Tub: Engineer, manufacturing systems: Standard Bathroom Accessibility: Yes  Lives With: Significant other IADL History Homemaking Responsibilities: Yes Meal Prep Responsibility: Secondary Cleaning Responsibility: Secondary Current License: Yes Occupation: Full time employment Type of Occupation: Paediatric nurse Prior Function Level of Independence: Independent with basic ADLs, Independent with  homemaking with ambulation, Independent with gait, Independent with transfers Driving: Yes Vocation: Full time employment Vocation Requirements: [place] Vision Baseline Vision/History: 0 No visual deficits Ability to See in Adequate Light: 0 Adequate Patient Visual Report: No change from baseline Vision Assessment?: No apparent visual deficits Perception  Perception: Within Functional Limits Praxis Praxis: WFL Cognition Cognition Overall Cognitive Status: Within Functional Limits for tasks assessed Arousal/Alertness: Awake/alert Orientation Level: Person;Place;Situation Memory: Appears intact Safety/Judgment: Appears intact Brief Interview for Mental Status (BIMS) Repetition of Three Words (First Attempt): 3 Temporal Orientation: Year: Correct Temporal Orientation: Month: Accurate within 5 days Temporal Orientation: Day: Correct Recall: "Sock": Yes, no cue required Recall: "Blue": Yes, no cue required Recall: "Bed": No, could not recall BIMS Summary Score: 13 Sensation Sensation Light Touch: Impaired Detail Peripheral sensation comments: Reports of tingling in BUE/BLE. Light Touch Impaired Details: Impaired  RUE;Impaired LUE;Impaired RLE;Impaired LLE Coordination Gross Motor Movements are Fluid and Coordinated: No Fine Motor Movements are Fluid and Coordinated: No Coordination and Movement Description: Deficits due to generalized debility and B-hand tremors. Motor  Motor Motor: Other (comment) Motor - Skilled Clinical Observations: Deficits due to generalized debility and B-hand tremors.  Trunk/Postural Assessment  Cervical Assessment Cervical Assessment: Exceptions to [facility] (Forward head) Thoracic Assessment Thoracic Assessment: Exceptions to [facility] (Rounded shoulders) Lumbar Assessment Lumbar Assessment: Exceptions to [facility] (Posterior pelvic tilt) Postural Control Postural Control: Deficits on evaluation Righting Reactions: Delayed Protective Responses: Delayed  Balance Balance Balance Assessed: Yes Static Sitting Balance Static Sitting - Balance Support: Feet supported Static Sitting - Level of Assistance: 5: Stand by assistance;4: Min assist (CGA-Min A) Extremity/Trunk Assessment RUE Assessment RUE Assessment: Exceptions to [facility] Active Range of Motion (AROM) Comments: WFL but painful General Strength Comments: 3-/5 LUE Assessment LUE Assessment: Exceptions to [facility] Active Range of Motion (AROM) Comments: WFL but painful General Strength Comments: 3-/5  Care Tool Care Tool Self Care Eating   Eating Assist Level: Supervision/Verbal cueing    Oral Care    Oral Care Assist Level: Set up assist (Bed-level)    Bathing   Body parts bathed by patient: Right arm;Left arm;Chest;Abdomen;Front perineal area;Buttocks;Right upper leg;Left upper leg;Face (Bed-level) Body parts bathed by helper: Right lower leg;Left lower leg   Assist Level: Minimal Assistance - Patient > 75% (Bed-level)    Upper Body Dressing(including orthotics)   What is the patient wearing?: Pull over shirt   Assist Level: Set up assist (Bed-level)    Lower Body Dressing (excluding footwear)     Assist for lower  body dressing: Moderate Assistance - Patient 50 - 74% (Bed-level)    Putting on/Taking off footwear   What is the patient wearing?: Socks Assist for footwear: Dependent - Patient 0%       Care Tool Toileting Toileting activity   Assist for toileting: Moderate Assistance - Patient 50 - 74%     Care Tool Bed Mobility Roll left and right activity        Sit to lying activity        Lying to sitting on side of bed activity         Care Tool Transfers Sit to stand transfer        Chair/bed transfer         Toilet transfer Toilet transfer activity did not occur: Safety/medical concerns (Increased dizziness/fatigue)       Care Tool Cognition  Expression of Ideas and Wants Expression of Ideas and Wants: 4. Without difficulty (complex and basic) - expresses complex messages without difficulty  and with speech that is clear and easy to understand  Understanding Verbal and Non-Verbal Content Understanding Verbal and Non-Verbal Content: 4. Understands (complex and basic) - clear comprehension without cues or repetitions   Memory/Recall Ability Memory/Recall Ability : Staff names and faces;That he or she is in a hospital/hospital unit   Refer to Care Plan for Long Term Goals  SHORT TERM GOAL WEEK 1 OT Short Term Goal 1 (Week 1): Pt will perform LB ADLs with Mod A + LRAD at EOB. OT Short Term Goal 2 (Week 1): Pt will perform toilet transfer with Min A + LRAD. OT Short Term Goal 3 (Week 1): Pt will tolerate standing activities >2 mins with Min A + LRAD.  Recommendations for other services: None    Skilled Therapeutic Intervention Session began with introduction to OT role, OT POC, and general orientation to rehab unit/schedule. BP at 124/82 in supine, transition to EOB with SUP/CGA, increased efforts noted. Pt tolerates ~1-2 mins of sitting EOB, BP assessment attempted but unable to accurately take due to patient request to return to supine as a result of dizziness/nausea. Upon  return to supine patient does cough-up small amount of mucus. Improvements in symptoms noted with extended rest, patient opting to complete ADLs at bed-level. Pt completes full-body sponge-bathing at bed-level with levels of assistance noted below. Pt remained resting in bed, all needs within reach.   ADL ADL Eating: Set up Where Assessed-Eating: Bed level Grooming: Setup Where Assessed-Grooming: Bed level Upper Body Bathing: Setup Where Assessed-Upper Body Bathing: Bed level Lower Body Bathing: Moderate assistance Where Assessed-Lower Body Bathing: Bed level Upper Body Dressing: Minimal assistance Where Assessed-Upper Body Dressing: Bed level Lower Body Dressing: Moderate assistance Where Assessed-Lower Body Dressing: Bed level Toileting: Setup Where Assessed-Toileting: Other (Comment) (Urinal) Toilet Transfer: Unable to assess Tub/Shower Transfer: Unable to assess Psychologist, counselling Transfer: Unable to assess Mobility  Bed Mobility Bed Mobility: Supine to Sit;Sit to Supine Supine to Sit: Contact Guard/Touching assist;Supervision/Verbal cueing Sit to Supine: Supervision/Verbal cueing;Contact Guard/Touching assist   Discharge Criteria: Patient will be discharged from OT if patient refuses treatment 3 consecutive times without medical reason, if treatment goals not met, if there is a change in medical status, if patient makes no progress towards goals or if patient is discharged from hospital.  The above assessment, treatment plan, treatment alternatives and goals were discussed and mutually agreed upon: by patient  Lou Cal, OTR/L, MSOT  08/05/2023, 11:53 AM

## 2023-08-05 NOTE — Progress Notes (Signed)
 Inpatient Rehabilitation  Patient information reviewed and entered into eRehab system by Feliberto Gottron, M.A., CCC-SLP, Rehab Quality Coordinator.  Information including medical coding, functional ability and quality indicators will be reviewed and updated through discharge.

## 2023-08-05 NOTE — Evaluation (Addendum)
 Speech Language Pathology Assessment and Plan  Patient Details  Name: Aaron [name] MRN: [id number] Date of Birth: [birth year]  SLP Diagnosis: Dysphagia;Cognitive Impairments  Rehab Potential: Excellent ELOS: 12-14 days   Today's Date: 08/05/2023 SLP Individual Time: 1100-1158 SLP Individual Time Calculation (min): 58 min   Hospital Problem: Principal Problem:   Debility  Past Medical History:  Past Medical History:  Diagnosis Date   Atrial fibrillation (HCC)    Hyperthyroidism    Mitral regurgitation    NSTEMI (non-ST elevated myocardial infarction) [facility])    Past Surgical History:  Past Surgical History:  Procedure Laterality Date   ECMO CANNULATION N/A 07/10/2023   Procedure: ECMO CANNULATION;  Surgeon: Dolores Patty, MD;  Location: [facility] INVASIVE CV LAB;  Service: Cardiovascular;  Laterality: N/A;   EMBOLECTOMY Left 07/18/2023   Procedure: EMBOLECTOMY Left Femoral, Popliteal and iliac arterys,  Repair Left common femoral artery.;  Surgeon: Victorino Sparrow, MD;  Location: [facility] OR;  Service: Vascular;  Laterality: Left;   INTRAOPERATIVE TRANSESOPHAGEAL ECHOCARDIOGRAM N/A 07/23/2023   Procedure: ECHOCARDIOGRAM, TRANSESOPHAGEAL, INTRAOPERATIVE;  Surgeon: Loreli Slot, MD;  Location: [facility] OR;  Service: Open Heart Surgery;  Laterality: N/A;   LEFT HEART CATH AND CORONARY ANGIOGRAPHY N/A 10/22/2021   Procedure: LEFT HEART CATH AND CORONARY ANGIOGRAPHY;  Surgeon: Yvonne Kendall, MD;  Location: [facility] INVASIVE CV LAB;  Service: Cardiovascular;  Laterality: N/A;   PLACEMENT OF IMPELLA LEFT VENTRICULAR ASSIST DEVICE Right 07/14/2023   Procedure: PLACEMENT OF RIGHT AXILLARY IMPELLA 5.5 LEFT VENTRICULAR ASSIST DEVICE;  Surgeon: Loreli Slot, MD;  Location: [facility] OR;  Service: Open Heart Surgery;  Laterality: Right;   REMOVAL OF IMPELLA LEFT VENTRICULAR ASSIST DEVICE Right 07/14/2023   Procedure: REMOVAL OF CP RIGHT FEMORAL IMPELLA LEFT VENTRICULAR ASSIST DEVICE;  Surgeon: Loreli Slot, MD;  Location: [facility] OR;  Service: Open Heart Surgery;  Laterality: Right;   REMOVAL OF IMPELLA LEFT VENTRICULAR ASSIST DEVICE N/A 07/23/2023   Procedure: REMOVAL, CARDIAC ASSIST DEVICE, IMPELLA;  Surgeon: Loreli Slot, MD;  Location: [facility] OR;  Service: Open Heart Surgery;  Laterality: N/A;   TRANSCATHETER MITRAL EDGE TO EDGE REPAIR N/A 07/16/2023   Procedure: TRANSCATHETER MITRAL EDGE TO EDGE REPAIR;  Surgeon: Tonny Bollman, MD;  Location: [facility] INVASIVE CV LAB;  Service: Cardiovascular;  Laterality: N/A;   TRANSESOPHAGEAL ECHOCARDIOGRAM (CATH LAB) N/A 07/16/2023   Procedure: TRANSESOPHAGEAL ECHOCARDIOGRAM;  Surgeon: Tonny Bollman, MD;  Location: [facility] INVASIVE CV LAB;  Service: Cardiovascular;  Laterality: N/A;   VENTRICULAR ASSIST DEVICE INSERTION N/A 07/10/2023   Procedure: VENTRICULAR ASSIST DEVICE INSERTION;  Surgeon: Dolores Patty, MD;  Location: [facility] INVASIVE CV LAB;  Service: Cardiovascular;  Laterality: N/A;    Assessment / Plan / Recommendation Clinical Impression HPI: A 38 y.o. male who presented to the ED [facility] initially on 07/09/23 for evaluation of chest pain and dyspnea. Was later transferred to [facility].  CTA revealed semental B lower lobe PE. On 07/10/23 had VT/VF arrest, requiring of CPR and multiple shocks. Placed on [place] ECMO with impella CP. Impella removed 07/23/23, extubated 3/16. acute Right hemisphere strokes on MRI 3/13.   PMH: PAF not adherent to Eliquis, chronic HFpEF, severe mitral regurgitation, hyperthyroidism   Clinical Impression:  Bedside Swallow Evaluation: A bedside swallow evaluation was completed to assess for s/sx of oropharyngeal dysphagia. Oral mechanism exam functional, though observed significant tightness of lingual frenulum resulting in reduced lingual ROM and protrusion. POs administered included thin liquids, purees and solids.  Patient with no s/sx of aspiration throughout, adequate oral clearance and timely  mastication. Per MBS results, recommend continuation of regular/thin diet with chin tuck during consumption of thin liquids. Medications should be administered whole in puree. Recommend intermittent supervision for use of chin tuck.  Cognitive-Linguistic:  The Cognistat was utilized to evaluate patients cognitive linguistic functioning. Patient scored WFL on all subtests despite mild deficits in calculations, similarities and word finding and moderate deficits in memory. Word finding deficits are likely due to cognition rather than language. Patient with adequate awareness of medical situation and subsequent deficits. Patient with [facility] attention to task throughout the session. Recommend targeting mildly complex problem solving and memory during inpatient rehabilitation stay. Dysarthria/Voice: Patient presents with a mildly hoarse vocal quality and tongue tie, though was 100% intelligibile at the conversational level. Patient reports vocal quality is improving, and notes difficulties in speech since childhood likely due to tightness of lingual frenulum. Patient with average maximum expiratory pressure of 110cmH20 this date, a significant improvement from acute care. Pt would benefit from skilled ST services to maximize dysphagia and cognition in order to maximize functional independence at d/c. Anticipate patient will require 24 hour supervision at d/c and f/u SLP services.    Skilled Therapeutic Interventions          Patient evaluated using a standardized cognitive linguistic assessment and bedside swallow evaluation to assess current cognitive, communicative and swallowing function. See above for details.    SLP Assessment  Patient will need skilled Speech Lanaguage Pathology Services during CIR admission    Recommendations  SLP Diet Recommendations: Age appropriate regular solids;Thin Liquid Administration via: Cup;Straw Medication Administration: Whole meds with puree Supervision: Staff to assist  with self feeding;Intermittent supervision to cue for compensatory strategies Compensations: Minimize environmental distractions;Slow rate;Small sips/bites;Chin tuck;Clear throat intermittently Postural Changes and/or Swallow Maneuvers: Seated upright 90 degrees Oral Care Recommendations: Oral care BID Patient destination: Home Follow up Recommendations: None Equipment Recommended: None recommended by SLP    SLP Frequency 3 to 5 out of 7 days   SLP Duration  SLP Intensity  SLP Treatment/Interventions 18-20 days  Minumum of 1-2 x/day, 30 to 90 minutes  Cognitive remediation/compensation;Dysphagia/aspiration precaution training;Internal/external aids;Cueing hierarchy;Therapeutic Activities;Functional tasks;Patient/family education    Pain None reported to SLP  SLP Evaluation Cognition Overall Cognitive Status: Impaired/Different from baseline Arousal/Alertness: Awake/alert Orientation Level: Oriented X4 Year: 2025 Month: March Day of Week: Correct Attention: Sustained Sustained Attention: Appears intact Memory: Impaired Memory Impairment: Decreased short term memory Decreased Short Term Memory: Verbal basic;Functional basic Awareness: Appears intact Problem Solving: Impaired Problem Solving Impairment: Verbal basic;Functional basic Safety/Judgment: Appears intact  Comprehension Auditory Comprehension Overall Auditory Comprehension: Appears within functional limits for tasks assessed Expression Expression Primary Mode of Expression: Verbal Verbal Expression Overall Verbal Expression: Appears within functional limits for tasks assessed Written Expression Dominant Hand: Right Oral Motor Oral Motor/Sensory Function Overall Oral Motor/Sensory Function: Mild impairment Facial ROM: Within Functional Limits Facial Symmetry: Within Functional Limits Facial Strength: Within Functional Limits Facial Sensation: Within Functional Limits Lingual ROM: Reduced right;Reduced left  (tight lingual frenulum resulting in reduced ROM bilaterally) Lingual Symmetry: Within Functional Limits Lingual Strength: Within Functional Limits Velum: Within Functional Limits Mandible: Within Functional Limits Motor Speech Overall Motor Speech: Impaired Respiration: Within functional limits Phonation: Low vocal intensity;Hoarse Resonance: Within functional limits Articulation: Impaired Intelligibility: Intelligible Motor Planning: Within functional limits  Care Tool Care Tool Cognition Ability to hear (with hearing aid or hearing appliances if normally used Ability to hear (with hearing  aid or hearing appliances if normally used): 0. Adequate - no difficulty in normal conservation, social interaction, listening to TV   Expression of Ideas and Wants Expression of Ideas and Wants: 4. Without difficulty (complex and basic) - expresses complex messages without difficulty and with speech that is clear and easy to understand   Understanding Verbal and Non-Verbal Content Understanding Verbal and Non-Verbal Content: 4. Understands (complex and basic) - clear comprehension without cues or repetitions  Memory/Recall Ability Memory/Recall Ability : Staff names and faces;That he or she is in a hospital/hospital unit;Current season    Bedside Swallowing Assessment General Diet Prior to this Study: Regular;Thin liquids (Level 0) Respiratory Status: Room air Behavior/Cognition: Alert;Cooperative;Pleasant mood Oral Cavity - Dentition: Adequate natural dentition Self-Feeding Abilities: Needs assist Vision: Functional for self-feeding Patient Positioning: Upright in bed Volitional Cough: Strong Volitional Swallow: Able to elicit  Ice Chips Ice chips: Not tested Thin Liquid Thin Liquid: Within functional limits Presentation: Cup;Self Fed;Straw Other Comments: chin tuck Nectar Thick Nectar Thick Liquid: Not tested Honey Thick Honey Thick Liquid: Not tested Puree Puree: Within  functional limits Presentation: Self Fed;Spoon Solid Solid: Within functional limits Presentation: Self Fed BSE Assessment Risk for Aspiration Impact on safety and function: Moderate aspiration risk Other Related Risk Factors: Cognitive impairment;Prolonged intubation;Deconditioning  Short Term Goals: Week 1: SLP Short Term Goal 1 (Week 1): Patient will utilize chin tuck during consumption of thin liquids given min multimodal A SLP Short Term Goal 2 (Week 1): Patient will demonstrate problem solving abilities in functional situations given min multimodal A SLP Short Term Goal 3 (Week 1): Patient will recall and utilize memory compensatory strategies given min multimodal A  Refer to Care Plan for Long Term Goals  Recommendations for other services: None   Discharge Criteria: Patient will be discharged from SLP if patient refuses treatment 3 consecutive times without medical reason, if treatment goals not met, if there is a change in medical status, if patient makes no progress towards goals or if patient is discharged from hospital.  The above assessment, treatment plan, treatment alternatives and goals were discussed and mutually agreed upon: by patient  Hildur Bayer M.A., CF-SLP 08/05/2023, 12:16 PM

## 2023-08-05 NOTE — Discharge Instructions (Addendum)
 Inpatient Rehab Discharge Instructions  Aaron [name] [facility] Discharge date and time: No discharge date for patient encounter.   Activities/Precautions/ Functional Status: Activity: activity as tolerated Diet: renal diet Wound Care: Routine skin checks Functional status:  ___ No restrictions     ___ Walk up steps independently ___ 24/7 supervision/assistance   ___ Walk up steps with assistance ___ Intermittent supervision/assistance  ___ Bathe/dress independently ___ Walk with walker     _x__ Bathe/dress with assistance ___ Walk Independently    ___ Shower independently ___ Walk with assistance    ___ Shower with assistance ___ No alcohol     ___ Return to work/school ________  Special Instructions: No driving smoking or alcohol  COMMUNITY REFERRALS UPON DISCHARGE:   Outpatient: PT   &   OT             Agency:[facility] [phone]              Appointment Date/Time:WILL CALL TO SET UP FOLLOW UP APPOINTMENTS  Medical Equipment/Items Ordered: TUB SEAT                                                 Agency/Supplier:[facility] [employer]  [phone]  [facility] REFERRAL MADE FOR SOCIAL SECURITY DISABILITY APPLICATION  [phone]   My questions have been answered and I understand these instructions. I will adhere to these goals and the provided educational materials after my discharge from the hospital.  Patient/Caregiver Signature _______________________________ Date __________  Clinician Signature _______________________________________ Date __________  Please bring this form and your medication list with you to all your follow-up doctor's appointments.    ____________________________________________________________________________________________________________  Information on my medicine - ELIQUIS (apixaban)  This medication education was reviewed with me or my healthcare representative as part of my discharge preparation.    Why was Eliquis prescribed  for you? Eliquis was prescribed to treat blood clots that may have been found in the veins of your legs (deep vein thrombosis) or in your lungs (pulmonary embolism) and to reduce the risk of them occurring again.  What do You need to know about Eliquis ? The dose is iso ONE 5 mg tablet taken TWICE daily.  Eliquis may be taken with or without food.   Try to take the dose about the same time in the morning and in the evening. If you have difficulty swallowing the tablet whole please discuss with your pharmacist how to take the medication safely.  Take Eliquis exactly as prescribed and DO NOT stop taking Eliquis without talking to the doctor who prescribed the medication.  Stopping may increase your risk of developing a new blood clot.  Refill your prescription before you run out.  After discharge, you should have regular check-up appointments with your healthcare provider that is prescribing your Eliquis.    What do you do if you miss a dose? If a dose of ELIQUIS is not taken at the scheduled time, take it as soon as possible on the same day and twice-daily administration should be resumed. The dose should not be doubled to make up for a missed dose.  Important Safety Information A possible side effect of Eliquis is bleeding. You should call your healthcare provider right away if you experience any of the following: Bleeding from an injury or your nose that does not stop. Unusual colored urine (red or  dark brown) or unusual colored stools (red or black). Unusual bruising for unknown reasons. A serious fall or if you hit your head (even if there is no bleeding).  Some medicines may interact with Eliquis and might increase your risk of bleeding or clotting while on Eliquis. To help avoid this, consult your healthcare provider or pharmacist prior to using any new prescription or non-prescription medications, including herbals, vitamins, non-steroidal anti-inflammatory drugs (NSAIDs) and  supplements.  This website has more information on Eliquis (apixaban): http://www.eliquis.com/eliquis/home

## 2023-08-05 NOTE — Progress Notes (Signed)
 PHARMACY - ANTICOAGULATION CONSULT NOTE  Pharmacy Consult for Heparin  Indication: pulmonary embolus, s/p ECMO decannulation 3/7, Impella removal 3/12  No Known Allergies  Patient Measurements: Height: 6\' 4"  (193 cm) Weight: 86.5 kg (190 lb 11.2 oz) IBW/kg (Calculated) : 86.8 Heparin Dosing Weight: n/a  Vital Signs: Temp: 98.2 F (36.8 C) (03/25 0509) BP: 127/78 (03/25 0602) Pulse Rate: 80 (03/25 0602)  Labs: Recent Labs    08/03/23 0401 08/04/23 0425 08/05/23 0500 08/05/23 0628  HGB 9.4* 9.2* 9.5*  --   HCT 28.7* 28.2* 29.9*  --   PLT 421* 407* 357  --   HEPARINUNFRC 0.45 0.47  --  0.55  CREATININE 9.15* 10.47* 7.73*  --     Estimated Creatinine Clearance: 15.9 mL/min (A) (by C-G formula based on SCr of 7.73 mg/dL (H)).   Medical History: Past Medical History:  Diagnosis Date   Atrial fibrillation [facility])    Hyperthyroidism    Mitral regurgitation    NSTEMI (non-ST elevated myocardial infarction) [facility])     Assessment: 38 yo M with bilateral PE s/p TNK (2/27 @1156 ) now on [place] ECMO + Impella. Pharmacy consulted for bivalirudin for anticoagulation.   S/p impella CP >5.5 3/3 - bivalirudin was previously held with oozing at insertion site but restart 3/4. Patient underwent mitraclip 3/5.  Bival transitioned to heparin 3/7 after decannulation.  Impella 5.5 removed 3/13. Heparin restarted 3/13. Hep in CRRT stopped 3/15 with CRRT stopping.   3/25 AM: Heparin level 0.55, therapeutic on 1900 units/hr. No issues with infusion running or signs of bleeding noted. Hgb 9.5, pltc 357 (stable).  Deferring TC placement today pending possible renal recovery - continuing heparin, eventually transition to Eliquis pending final decision.   Goal of Therapy:  Heparin level 0.3-0.7 units/mL Monitor platelets by anticoagulation protocol: Yes   Plan:  Continue heparin infusion at 1900 units/hr Heparin level and CBC daily  Monitor s/sx of bleeding   Trixie Rude, PharmD Clinical  Pharmacist 08/05/2023  12:04 PM

## 2023-08-05 NOTE — Discharge Summary (Signed)
 Physician Discharge Summary  Patient ID: Aaron [name] MRN: [id number] DOB/AGE: 1985-10-22 38 y.o.  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 Modified Barium Swallow Study Patient Details Name: Aaron [name] MRN: [id number] Date of Birth: 08-20-1985 Today's Date: 08/13/2023 HPI/PMH: HPI: Aaron [name] is a 38 yo male presenting to ED 2/26 with chest pain and dyspnea. Admitted with bilateral lower lobe pulmonary emboli. VT/VF cardiac arrest 2/27, requiring 40 minutes of CPR and multiple shocks. Impella placed 2/27-3/12. ECMO initiated 2/27-3/8. CRRT stopped 3/15. ETT 2/27-3/12, reintubated 3/12-3/16 s/p mucus plugging and L lung opacification. 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 MBS 3/18 showed gross silent aspiration of thin and nectar thick liquids. SLP recommended pt start diet of Dys 3 solids with honey thick liquids and initiated use of EMST. PMH includes hypothyroidism, paroxysmal A-fib, severe mitral regurgitation Clinical Impression: Clinical Impression: Pt presents with  oropharyngeal swallowing function that is grossly within functional limits with intact safety and efficiency across textures. Patient exhibited penetration above the level of the vocal folds which remained in the vestibule with first sip of thin liquid. This cleared with cued cough. No further intances of penetration/aspiration observed across consistencies. Trace residue coated pyriform sinuses after thin liquid trials however this largely cleared spontaneously. Recommend continuation of regular/thin liquid diet. Discontinue use of chin tuck strategy. Administer medications whole with thin liquids. SLP will sign off given return to normal swallowing function. Factors that may increase risk of adverse event in presence of aspiration Aaron [name] & Aaron [name] 2021): No data recorded Recommendations/Plan: Swallowing Evaluation Recommendations Swallowing Evaluation Recommendations Recommendations: PO diet PO Diet Recommendation: Regular; Thin liquids (Level 0) Liquid Administration via: Cup; Straw Medication Administration: Whole meds with liquid Supervision: Patient able to self-feed Postural changes: Position pt fully upright for meals Oral care recommendations: Oral care BID (2x/day) Treatment Plan Treatment Plan Treatment recommendations: No treatment recommended at this time Follow-up recommendations: No SLP follow up Functional status assessment: Patient has had a recent decline in their functional status and demonstrates the ability to make significant improvements in function in a reasonable and predictable amount of time. Recommendations Recommendations for follow up therapy are one component of a multi-disciplinary discharge planning process, led by the attending physician.  Recommendations may be updated based on patient status, additional functional criteria and insurance authorization. Assessment: Orofacial Exam: Orofacial Exam Oral Cavity: Oral Hygiene: WFL Oral Cavity - Dentition: Adequate natural dentition  Orofacial Anatomy: WFL Oral Motor/Sensory Function: WFL Anatomy: Anatomy: WFL Boluses Administered: Boluses Administered Boluses Administered: Thin liquids (Level 0); Mildly thick liquids (Level 2, nectar thick); Moderately thick liquids (Level 3, honey thick); Solid; Puree  Oral Impairment Domain: Oral Impairment Domain Lip Closure: No labial escape Tongue control during bolus hold: Cohesive bolus between  tongue to palatal seal Bolus preparation/mastication: Timely and efficient chewing and mashing Bolus transport/lingual motion: Brisk tongue motion Oral residue: Complete oral Aaron Location of oral residue : N/A Initiation of pharyngeal swallow : Posterior angle of the ramus  Pharyngeal Impairment Domain: Pharyngeal Impairment Domain Soft palate elevation: No bolus between soft palate (SP)/pharyngeal wall (PW) Laryngeal elevation: Complete superior movement of thyroid cartilage with complete approximation of arytenoids to epiglottic petiole Anterior hyoid excursion: Complete anterior movement Epiglottic movement: Complete inversion Laryngeal vestibule closure: Incomplete, narrow column air/contrast in laryngeal vestibule Pharyngeal stripping wave : Present - complete Pharyngeal contraction (A/P view only): N/A Pharyngoesophageal segment opening: Complete distension and complete duration, no obstruction of flow Tongue base retraction: No contrast between tongue base and posterior pharyngeal wall (PPW) Pharyngeal residue: Trace residue within or on pharyngeal structures  Esophageal Impairment Domain: Esophageal Impairment Domain Esophageal Aaron upright position: Complete Aaron, esophageal coating Pill: Pill Consistency administered: Thin liquids (Level 0) Thin liquids (Level 0): [facility] Penetration/Aspiration Scale Score: Penetration/Aspiration Scale Score 1.  Material does not enter airway: Mildly thick liquids (Level 2, nectar thick); Moderately thick liquids (Level 3, honey thick); Puree; Solid; Pill 3.   Material enters airway, remains ABOVE vocal cords and not ejected out: Thin liquids (Level 0) Compensatory Strategies: Compensatory Strategies Compensatory strategies: No   General Information: Caregiver present: No  Diet Prior to this Study: Regular; Thin liquids (Level 0)   Temperature : Normal   Respiratory Status: WFL   Supplemental O2: None (Room air)   History of Recent Intubation: Yes  Behavior/Cognition: Alert; Cooperative; Pleasant mood Self-Feeding Abilities: Able to self-feed Baseline vocal quality/speech: Dysphonic Volitional Cough: Able to elicit Volitional Swallow: Able to elicit Exam Limitations: No limitations Goal Planning: Prognosis for improved oropharyngeal function: Good No data recorded No data recorded Patient/Family Stated Goal: none stated Consulted and agree with results and recommendations: Patient Pain: Pain Assessment Pain Assessment: No/denies pain End of Session: Start Time:No data recorded Stop Time: No data recorded Time Calculation:No data recorded Charges: No data recorded SLP visit diagnosis: SLP Visit Diagnosis: Dysphagia, oropharyngeal phase (R13.12) Past Medical History: Past Medical History: Diagnosis Date  Atrial fibrillation (HCC)   Hyperthyroidism   Mitral regurgitation   NSTEMI (non-ST elevated myocardial infarction) [facility])  Past Surgical History: Past Surgical History: Procedure Laterality Date  ECMO CANNULATION N/A 07/10/2023  Procedure: ECMO CANNULATION;  Surgeon: Dolores Patty, MD;  Location: [facility] INVASIVE CV LAB;  Service: Cardiovascular;  Laterality: N/A;  EMBOLECTOMY Left 07/18/2023  Procedure: EMBOLECTOMY Left Femoral, Popliteal and iliac arterys,  Repair Left common femoral artery.;  Surgeon: Victorino Sparrow, MD;  Location: [facility] OR;  Service: Vascular;  Laterality: Left;  INTRAOPERATIVE TRANSESOPHAGEAL ECHOCARDIOGRAM N/A 07/23/2023  Procedure: ECHOCARDIOGRAM, TRANSESOPHAGEAL, INTRAOPERATIVE;  Surgeon: Loreli Slot, MD;  Location: [facility] OR;  Service: Open Heart  Surgery;  Laterality: N/A;  IR FLUORO GUIDE CV LINE RIGHT  08/08/2023  IR US GUIDE VASC ACCESS RIGHT  08/08/2023  LEFT HEART CATH AND CORONARY ANGIOGRAPHY N/A 10/22/2021  Procedure: LEFT HEART CATH AND CORONARY ANGIOGRAPHY;  Surgeon: Yvonne Kendall, MD;  Location: [facility] INVASIVE CV LAB;  Service: Cardiovascular;  Laterality: N/A;  PLACEMENT OF IMPELLA LEFT VENTRICULAR ASSIST DEVICE Right 07/14/2023  Procedure: PLACEMENT OF RIGHT AXILLARY IMPELLA 5.5 LEFT VENTRICULAR ASSIST DEVICE;  Surgeon: Loreli Slot, MD;  Location: [facility] OR;  Service: Open Heart Surgery;  Laterality: Right;  REMOVAL OF IMPELLA LEFT VENTRICULAR ASSIST DEVICE Right 07/14/2023  Procedure: REMOVAL OF CP RIGHT FEMORAL IMPELLA LEFT VENTRICULAR ASSIST  DEVICE;  Surgeon: Loreli Slot, MD;  Location: [facility] OR;  Service: Open Heart Surgery;  Laterality: Right;  REMOVAL OF IMPELLA LEFT VENTRICULAR ASSIST DEVICE N/A 07/23/2023  Procedure: REMOVAL, CARDIAC ASSIST DEVICE, IMPELLA;  Surgeon: Loreli Slot, MD;  Location: [facility] OR;  Service: Open Heart Surgery;  Laterality: N/A;  TRANSCATHETER MITRAL EDGE TO EDGE REPAIR N/A 07/16/2023  Procedure: TRANSCATHETER MITRAL EDGE TO EDGE REPAIR;  Surgeon: Tonny Bollman, MD;  Location: [facility] INVASIVE CV LAB;  Service: Cardiovascular;  Laterality: N/A;  TRANSESOPHAGEAL ECHOCARDIOGRAM (CATH LAB) N/A 07/16/2023  Procedure: TRANSESOPHAGEAL ECHOCARDIOGRAM;  Surgeon: Tonny Bollman, MD;  Location: [facility] INVASIVE CV LAB;  Service: Cardiovascular;  Laterality: N/A;  VENTRICULAR ASSIST DEVICE INSERTION N/A 07/10/2023  Procedure: VENTRICULAR ASSIST DEVICE INSERTION;  Surgeon: Dolores Patty, MD;  Location: [facility] INVASIVE CV LAB;  Service: Cardiovascular;  Laterality: N/A; Yetta Barre 08/13/2023, 9:49 AM  ECHOCARDIOGRAM COMPLETE Result Date: 08/08/2023    ECHOCARDIOGRAM REPORT   Patient Name:   Aaron [name] Date of Exam: 08/08/2023 Medical Rec #:  [id number]    Height:       76.0 in Accession #:    [id number]   Weight:        189.6 lb Date of Birth:  1985-07-09    BSA:          2.165 m Patient Age:    37 years     BP:           122/74 mmHg Patient Gender: M            HR:           97 bpm. Exam Location:  Inpatient Procedure: 2D Echo, 3D Echo, Cardiac Doppler and Color Doppler (Both Spectral            and Color Flow Doppler were utilized during procedure). 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 Previous Myocardial Infarction,                 Arrythmias:Atrial Fibrillation; Signs/Symptoms:Chest Pain.                  Mitral Valve: Mitra-Clip valve is present in the mitral                 position. Procedure Date: 07/16/2023.  Sonographer:    Lucendia Herrlich RCS Referring Phys: [id number] KATHRYN R THOMPSON IMPRESSIONS  1. Left ventricular ejection fraction, by estimation, is 40 to 45%. Left ventricular ejection fraction by 3D volume is 40 %. The left ventricle has mildly decreased function. The left ventricle demonstrates global hypokinesis. There is mild left ventricular hypertrophy. Left ventricular diastolic parameters are consistent with Grade II diastolic dysfunction (pseudonormalization).  2. Right ventricular systolic function is normal. The right ventricular size is normal. There is normal pulmonary artery systolic pressure. The estimated right ventricular systolic pressure is 26.6 mmHg.  3. Left atrial size was moderately dilated.  4. S/p mTEER with 3 XTW clips (implant 07/16/2023), mild mitral regurgitation, possible mild stenosis (Mean gradient 6 mmHG, MVA (VTI) 1.6cm2, RVSP , HR 91bpm). Consider a repeat limited echocardiogarm when heart rate better controlled. . There is  a Mitra-Clip present in the mitral position. Procedure Date: 07/16/2023.  5. The aortic valve is tricuspid. Aortic valve regurgitation is mild. No  aortic stenosis is present.  6. The inferior vena cava is normal in size with greater than  50% respiratory variability, suggesting right atrial pressure of 3 mmHg. FINDINGS  Left Ventricle: Left ventricular ejection fraction, by estimation, is 40 to 45%. Left ventricular ejection fraction by 3D volume is 40 %. The left ventricle has mildly decreased function. The left ventricle demonstrates global hypokinesis. The left ventricular internal cavity size was normal in size. There is mild left ventricular hypertrophy. Left ventricular diastolic parameters are consistent with Grade II diastolic dysfunction (pseudonormalization). Right Ventricle: The right ventricular size is normal. No increase in right ventricular wall thickness. Right ventricular systolic function is normal. There is normal pulmonary artery systolic pressure. The tricuspid regurgitant velocity is 2.43 m/s, and  with an assumed right atrial pressure of 3 mmHg, the estimated right ventricular systolic pressure is 26.6 mmHg. Left Atrium: Left atrial size was moderately dilated. Right Atrium: Right atrial size was normal in size. Pericardium: There is no evidence of pericardial effusion. Mitral Valve: S/p mTEER with 3 XTW clips (implant 07/16/2023), mild mitral regurgitation, possible mild stenosis (Mean gradient 6 mmHG, MVA (VTI) 1.6cm2, RVSP , HR 91bpm). Consider a repeat limited echocardiogarm when heart rate better controlled. There is a Mitra-Clip present in the mitral position. Procedure Date: 07/16/2023. MV peak gradient, 14.7 mmHg. The mean mitral valve gradient is 6.0 mmHg. Tricuspid Valve: The tricuspid valve is normal in structure. Tricuspid valve regurgitation is mild . No evidence of tricuspid stenosis. Aortic Valve: The aortic valve is tricuspid. Aortic valve regurgitation is mild. No aortic stenosis is present. Aortic valve peak gradient measures 5.7 mmHg. Pulmonic Valve: The pulmonic valve was normal in structure. Pulmonic valve regurgitation is not visualized. No evidence of pulmonic stenosis. Aorta: The aortic root and  ascending aorta are structurally normal, with no evidence of dilitation. Venous: The inferior vena cava is normal in size with greater than 50% respiratory variability, suggesting right atrial pressure of 3 mmHg. IAS/Shunts: The atrial septum is grossly normal. Additional Comments: 3D was performed not requiring image post processing on an independent workstation and was abnormal.  LEFT VENTRICLE PLAX 2D LVIDd:         4.60 cm         Diastology LVIDs:         3.60 cm         LV e' medial:   8.38 cm/s LV PW:         1.20 cm         LV E/e' medial: 14.9 LV IVS:        1.40 cm LVOT diam:     2.60 cm LV SV:         70              3D Volume EF LV SV Index:   32              LV 3D EF:    Left LVOT Area:     5.31 cm                     ventricul                                             ar  ejection                                             fraction                                             by 3D                                             volume is                                             40 %.                                 3D Volume EF:                                3D EF:        40 %                                LV EDV:       199 ml                                LV ESV:       120 ml                                LV SV:        79 ml RIGHT VENTRICLE             IVC RV S prime:     18.20 cm/s  IVC diam: 1.10 cm TAPSE (M-mode): 3.0 cm LEFT ATRIUM              Index        RIGHT ATRIUM           Index LA diam:        3.30 cm  1.52 cm/m   RA Area:     21.00 cm LA Vol (A2C):   104.0 ml 48.03 ml/m  RA Volume:   60.05 ml  27.73 ml/m LA Vol (A4C):   84.9 ml  39.21 ml/m LA Biplane Vol: 100.0 ml 46.18 ml/m  AORTIC VALVE AV Area (Vmax): 4.39 cm AV Vmax:        119.50 cm/s AV Peak Grad:   5.7 mmHg LVOT Vmax:      98.70 cm/s LVOT Vmean:     61.967 cm/s LVOT VTI:       0.132 m  AORTA Ao Root diam: 3.50 cm Ao Asc diam:  3.30 cm MITRAL VALVE                TRICUSPID  VALVE  MV Area (PHT): 2.87 cm     TR Peak grad:   23.6 mmHg MV Area VTI:   1.63 cm     TR Vmax:        243.00 cm/s MV Peak grad:  14.7 mmHg MV Mean grad:  6.0 mmHg     SHUNTS MV Vmax:       1.92 m/s     Systemic VTI:  0.13 m MV Vmean:      116.0 cm/s   Systemic Diam: 2.60 cm MV Decel Time: 264 msec MV E velocity: 125.00 cm/s MV A velocity: 175.00 cm/s MV E/A ratio:  0.71 Sunit Tolia Electronically signed by Tessa Lerner Signature Date/Time: 08/08/2023/1:47:10 PM    Final    IR Fluoro Guide CV Line Right Result Date: 08/08/2023 INDICATION: ESRD, requiring HD. EXAM: TUNNELED CENTRAL VENOUS HEMODIALYSIS CATHETER PLACEMENT WITH ULTRASOUND AND FLUOROSCOPIC GUIDANCE MEDICATIONS: Ancef 2 gm IV. The antibiotic was given in an appropriate time interval prior to skin puncture. ANESTHESIA/SEDATION: Moderate (conscious) sedation was employed during this procedure. A total of Versed 1 mg and Fentanyl 25 mcg was administered intravenously. Moderate Sedation Time: 15 minutes. The patient's level of consciousness and vital signs were monitored continuously by radiology nursing throughout the procedure under my direct supervision. FLUOROSCOPY TIME:  Fluoroscopic dose; 1 mGy COMPLICATIONS: None immediate. PROCEDURE: Informed written consent was obtained from the patient after a discussion of the risks, benefits, and alternatives to treatment. Questions regarding the procedure were encouraged and answered. The RIGHT neck and chest were prepped with chlorhexidine in a sterile fashion, and a sterile drape was applied covering the operative field. Maximum barrier sterile technique with sterile gowns and gloves were used for the procedure. A timeout was performed prior to the initiation of the procedure. After creating a small venotomy incision, a micropuncture kit was utilized to access the internal jugular vein. Real-time ultrasound guidance was utilized for vascular access including the acquisition of a permanent ultrasound image  documenting patency of the accessed vessel. The microwire was utilized to measure appropriate catheter length. A stiff Glidewire was advanced to the level of the IVC and the micropuncture sheath was exchanged for a peel-away sheath. A palindrome tunneled hemodialysis catheter measuring 23 cm from tip to cuff was tunneled in a retrograde fashion from the anterior chest wall to the venotomy incision. The catheter was then placed through the peel-away sheath with tips ultimately positioned within the superior aspect of the right atrium. Final catheter positioning was confirmed and documented with a spot radiographic image. The catheter aspirates and flushes normally. The catheter was flushed with appropriate volume heparin dwells. The catheter exit site was secured with a 0-Prolene retention suture. The venotomy incision was closed with Dermabond. Dressings were applied. The patient tolerated the procedure well without immediate post procedural complication. IMPRESSION: Successful placement of 23 cm tip to cuff tunneled hemodialysis catheter via the RIGHT internal jugular vein. The tip of the catheter is positioned within the proximal RIGHT atriumat the superior cavo-atrial junction. The catheter is ready for immediate use. Roanna Banning, MD [facility] Electronically Signed   By: Roanna Banning M.D.   On: 08/08/2023 13:46   IR US Guide Vasc Access Right Result Date: 08/08/2023 INDICATION: ESRD, requiring HD. EXAM: TUNNELED CENTRAL VENOUS HEMODIALYSIS CATHETER PLACEMENT WITH ULTRASOUND AND FLUOROSCOPIC GUIDANCE MEDICATIONS: Ancef 2 gm IV. The antibiotic was given in an appropriate time interval prior to skin puncture. ANESTHESIA/SEDATION: Moderate (conscious) sedation was employed during this procedure.  A total of Versed 1 mg and Fentanyl 25 mcg was administered intravenously. Moderate Sedation Time: 15 minutes. The patient's level of consciousness and vital  signs were monitored continuously by radiology nursing throughout the procedure under my direct supervision. FLUOROSCOPY TIME:  Fluoroscopic dose; 1 mGy COMPLICATIONS: None immediate. PROCEDURE: Informed written consent was obtained from the patient after a discussion of the risks, benefits, and alternatives to treatment. Questions regarding the procedure were encouraged and answered. The RIGHT neck and chest were prepped with chlorhexidine in a sterile fashion, and a sterile drape was applied covering the operative field. Maximum barrier sterile technique with sterile gowns and gloves were used for the procedure. A timeout was performed prior to the initiation of the procedure. After creating a small venotomy incision, a micropuncture kit was utilized to access the internal jugular vein. Real-time ultrasound guidance was utilized for vascular access including the acquisition of a permanent ultrasound image documenting patency of the accessed vessel. The microwire was utilized to measure appropriate catheter length. A stiff Glidewire was advanced to the level of the IVC and the micropuncture sheath was exchanged for a peel-away sheath. A palindrome tunneled hemodialysis catheter measuring 23 cm from tip to cuff was tunneled in a retrograde fashion from the anterior chest wall to the venotomy incision. The catheter was then placed through the peel-away sheath with tips ultimately positioned within the superior aspect of the right atrium. Final catheter positioning was confirmed and documented with a spot radiographic image. The catheter aspirates and flushes normally. The catheter was flushed with appropriate volume heparin dwells. The catheter exit site was secured with a 0-Prolene retention suture. The venotomy incision was closed with Dermabond. Dressings were applied. The patient tolerated the procedure well without immediate post procedural complication. IMPRESSION: Successful placement of 23 cm tip to cuff  tunneled hemodialysis catheter via the RIGHT internal jugular vein. The tip of the catheter is positioned within the proximal RIGHT atriumat the superior cavo-atrial junction. The catheter is ready for immediate use. Roanna Banning, MD [facility] Electronically Signed   By: Roanna Banning M.D.   On: 08/08/2023 13:46   DG Abd 2 Views Result Date: 08/07/2023 CLINICAL DATA:  101717 Nausea [id number] EXAM: ABDOMEN - 2 VIEW COMPARISON:  07/31/2023 FINDINGS: The bowel gas pattern is normal. Enteric contrast is present within the colon and rectum. There is no evidence of free air. No radio-opaque calculi or other significant radiographic abnormality is seen. IMPRESSION: Negative. Electronically Signed   By: Duanne Guess D.O.   On: 08/07/2023 15:00   DG Swallowing Func-Speech Pathology Result Date: 08/04/2023 Table formatting from the original result was not included. Modified Barium Swallow Study Patient Details Name: Aaron [name] MRN: [id number] Date of Birth: [birth year] Today's Date: 08/04/2023 HPI/PMH: HPI: Aaron [name] is a 38 yo male presenting to ED 2/26 with chest pain and dyspnea. Admitted with bilateral lower lobe pulmonary emboli. VT/VF cardiac arrest 2/27, requiring 40 minutes of CPR and multiple shocks. Impella placed 2/27-3/12. ECMO initiated 2/27-3/8. CRRT stopped 3/15. ETT 2/27-3/12, reintubated 3/12-3/16 s/p mucus plugging and L lung opacification. 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 MBS 3/18 showed gross silent aspiration of thin and nectar thick liquids. SLP recommended pt start diet of Dys 3 solids with honey thick liquids and initiated use of EMST. PMH includes hypothyroidism, paroxysmal A-fib, severe mitral regurgitation Clinical Impression: Pt presents with moderate  oropharyngeal dysphagia. While this is improved from [facility] 3/18, pt continues to  have deficits related to timing and airway protection leading to penetration and aspiration of thin and nectar thick liquids. Pt's cough is intermittently more effective at clearing penetrates from the vocal folds, but he is not always able to expel penetrates completely (PAS 5). There was one instance of trace aspiration with thin liquids (PAS 8) that was not cleared by a cued cough. A chin tuck posture was effective in preventing penetrates from reaching the vocal folds (PAS 2). He continues to have mild pharyngeal residue. Recommend continuing regular diet but ugprading to thin liquids. He may benefit from full supervision initially to ensure he uses a chin tuck posture any time he drinks liquids. Encourage intermittent hard coughing. Pt is receptive to exercises that will improve his swallowing. SLP will f/u to continue targeting compensatory strategies and EMST with consideration of using a device with a higher threshold. DIGEST Swallow Severity Rating*  Safety: 2  Efficiency: 1  Overall Pharyngeal Swallow Severity: 2 (moderate) 1: mild; 2: moderate; 3: severe; 4: profound *The Dynamic Imaging Grade of Swallowing Toxicity is standardized for the head and neck cancer population, however, demonstrates promising clinical applications across populations to standardize the clinical rating of pharyngeal swallow safety and severity. Factors that may increase risk of adverse event in presence of aspiration Aaron [name] & Aaron [name] 2021): Factors that may increase risk of adverse event in presence of aspiration Aaron [name] & Aaron [name] 2021): Poor general health and/or compromised immunity; Frail or deconditioned Recommendations/Plan: Swallowing Evaluation Recommendations Swallowing Evaluation Recommendations Recommendations: PO diet PO Diet Recommendation: Regular; Thin liquids (Level 0) Liquid Administration via: Cup; Straw Medication Administration: Whole meds with puree Supervision: Staff to assist with self-feeding; Full  supervision/cueing for swallowing strategies Swallowing strategies  : Chin tuck; Hard cough after swallowing; Slow rate; Small bites/sips Postural changes: Position pt fully upright for meals Oral care recommendations: Oral care QID (4x/day); Oral care before PO; Use suctioning for oral care Recommended consults: Consider ENT consultation Treatment Plan Treatment Plan Treatment recommendations: Therapy as outlined in treatment plan below Follow-up recommendations: Acute inpatient rehab (3 hours/day) Functional status assessment: Patient has had a recent decline in their functional status and demonstrates the ability to make significant improvements in function in a reasonable and predictable amount of time. Treatment frequency: Min 2x/week Treatment duration: 2 weeks Interventions: Aspiration precaution training; Compensatory techniques; Patient/family education; Trials of upgraded texture/liquids; Diet toleration management by SLP; Respiratory muscle strength training Recommendations Recommendations for follow up therapy are one component of a multi-disciplinary discharge planning process, led by the attending physician.  Recommendations may be updated based on patient status, additional functional criteria and insurance authorization. Assessment: Orofacial Exam: Orofacial Exam Oral Cavity: Oral Hygiene: WFL Oral Cavity - Dentition: Adequate natural dentition Orofacial Anatomy: WFL Oral Motor/Sensory Function: WFL Anatomy: Anatomy: WFL Boluses Administered: Boluses Administered Boluses Administered: Thin liquids (Level 0); Mildly thick liquids (Level 2, nectar thick); Moderately thick liquids (Level 3, honey thick); Solid  Oral Impairment Domain: Oral Impairment Domain Lip Closure: No labial escape Tongue control during bolus hold: Cohesive bolus between tongue to palatal seal Bolus preparation/mastication: Timely and efficient chewing and mashing Bolus transport/lingual motion: Brisk tongue motion Oral residue:  Complete oral Aaron Location of oral residue : N/A Initiation of pharyngeal swallow : Posterior angle of the ramus  Pharyngeal Impairment Domain: Pharyngeal Impairment Domain Soft palate elevation: No bolus between soft palate (SP)/pharyngeal wall (PW) Laryngeal elevation: Complete superior movement of thyroid cartilage  with complete approximation of arytenoids to epiglottic petiole Anterior hyoid excursion: Complete anterior movement Epiglottic movement: Complete inversion Laryngeal vestibule closure: Complete, no air/contrast in laryngeal vestibule Pharyngeal stripping wave : Present - complete Pharyngeal contraction (A/P view only): N/A Pharyngoesophageal segment opening: Complete distension and complete duration, no obstruction of flow Tongue base retraction: No contrast between tongue base and posterior pharyngeal wall (PPW) Pharyngeal residue: Trace residue within or on pharyngeal structures Location of pharyngeal residue: Valleculae; Pyriform sinuses  Esophageal Impairment Domain: No data recorded Pill: No data recorded Penetration/Aspiration Scale Score: Penetration/Aspiration Scale Score 1.  Material does not enter airway: Moderately thick liquids (Level 3, honey thick); Solid 4.  Material enters airway, CONTACTS cords then ejected out: Mildly thick liquids (Level 2, nectar thick) 8.  Material enters airway, passes BELOW cords without attempt by patient to eject out (silent aspiration) : Thin liquids (Level 0) Compensatory Strategies: Compensatory Strategies Compensatory strategies: Yes Straw: Ineffective Ineffective Straw: Thin liquid (Level 0); Mildly thick liquid (Level 2, nectar thick) Chin tuck: Effective Effective Chin Tuck: Thin liquid (Level 0)   General Information: Caregiver present: No  Diet Prior to this Study: Regular; Moderately thick liquids (Level 3, honey thick)   Temperature : Normal   Respiratory Status: WFL   Supplemental O2: None (Room air)   History of Recent Intubation: Yes   Behavior/Cognition: Alert; Cooperative; Pleasant mood Self-Feeding Abilities: Able to self-feed Baseline vocal quality/speech: Dysphonic Volitional Cough: Able to elicit Volitional Swallow: Able to elicit Exam Limitations: No limitations Goal Planning: Prognosis for improved oropharyngeal function: Good Barriers to Reach Goals: Cognitive deficits; Time post onset; Severity of deficits No data recorded Patient/Family Stated Goal: none stated Consulted and agree with results and recommendations: Patient Pain: Pain Assessment Pain Assessment: No/denies pain End of Session: Start Time:SLP Start Time (ACUTE ONLY): 1426 Stop Time: SLP Stop Time (ACUTE ONLY): 1442 Time Calculation:SLP Time Calculation (min) (ACUTE ONLY): 16 min Charges: SLP Evaluations $ SLP Speech Visit: 1 Visit SLP Evaluations $MBS Swallow: 1 Procedure $Swallowing Treatment: 1 Procedure SLP visit diagnosis: SLP Visit Diagnosis: Dysphagia, oropharyngeal phase (R13.12) Past Medical History: Past Medical History: Diagnosis Date  Atrial fibrillation (HCC)   Hyperthyroidism   Mitral regurgitation   NSTEMI (non-ST elevated myocardial infarction) [facility])  Past Surgical History: Past Surgical History: Procedure Laterality Date  ECMO CANNULATION N/A 07/10/2023  Procedure: ECMO CANNULATION;  Surgeon: Dolores Patty, MD;  Location: [facility] INVASIVE CV LAB;  Service: Cardiovascular;  Laterality: N/A;  EMBOLECTOMY Left 07/18/2023  Procedure: EMBOLECTOMY Left Femoral, Popliteal and iliac arterys,  Repair Left common femoral artery.;  Surgeon: Victorino Sparrow, MD;  Location: [facility] OR;  Service: Vascular;  Laterality: Left;  INTRAOPERATIVE TRANSESOPHAGEAL ECHOCARDIOGRAM N/A 07/23/2023  Procedure: ECHOCARDIOGRAM, TRANSESOPHAGEAL, INTRAOPERATIVE;  Surgeon: Loreli Slot, MD;  Location: [facility] OR;  Service: Open Heart Surgery;  Laterality: N/A;  LEFT HEART CATH AND CORONARY ANGIOGRAPHY N/A 10/22/2021  Procedure: LEFT HEART CATH AND CORONARY ANGIOGRAPHY;  Surgeon: Yvonne Kendall, MD;  Location: [facility] INVASIVE CV LAB;  Service: Cardiovascular;  Laterality: N/A;  PLACEMENT OF IMPELLA LEFT VENTRICULAR ASSIST DEVICE Right 07/14/2023  Procedure: PLACEMENT OF RIGHT AXILLARY IMPELLA 5.5 LEFT VENTRICULAR ASSIST DEVICE;  Surgeon: Loreli Slot, MD;  Location: [facility] OR;  Service: Open Heart Surgery;  Laterality: Right;  REMOVAL OF IMPELLA LEFT VENTRICULAR ASSIST DEVICE Right 07/14/2023  Procedure: REMOVAL OF CP RIGHT FEMORAL IMPELLA LEFT VENTRICULAR ASSIST DEVICE;  Surgeon: Loreli Slot, MD;  Location: [facility] OR;  Service: Open Heart Surgery;  Laterality:  Right;  REMOVAL OF IMPELLA LEFT VENTRICULAR ASSIST DEVICE N/A 07/23/2023  Procedure: REMOVAL, CARDIAC ASSIST DEVICE, IMPELLA;  Surgeon: Loreli Slot, MD;  Location: [facility] OR;  Service: Open Heart Surgery;  Laterality: N/A;  TRANSCATHETER MITRAL EDGE TO EDGE REPAIR N/A 07/16/2023  Procedure: TRANSCATHETER MITRAL EDGE TO EDGE REPAIR;  Surgeon: Tonny Bollman, MD;  Location: [facility] INVASIVE CV LAB;  Service: Cardiovascular;  Laterality: N/A;  TRANSESOPHAGEAL ECHOCARDIOGRAM (CATH LAB) N/A 07/16/2023  Procedure: TRANSESOPHAGEAL ECHOCARDIOGRAM;  Surgeon: Tonny Bollman, MD;  Location: [facility] INVASIVE CV LAB;  Service: Cardiovascular;  Laterality: N/A;  VENTRICULAR ASSIST DEVICE INSERTION N/A 07/10/2023  Procedure: VENTRICULAR ASSIST DEVICE INSERTION;  Surgeon: Dolores Patty, MD;  Location: [facility] INVASIVE CV LAB;  Service: Cardiovascular;  Laterality: N/A; Gwynneth Aliment, M.A., CF-SLP Speech Language Pathology, Acute Rehabilitation Services Secure Chat preferred [phone] 08/04/2023, 3:43 PM  ECHOCARDIOGRAM COMPLETE Result Date: 08/01/2023    ECHOCARDIOGRAM REPORT   Patient Name:   Aaron [name] Date of Exam: 08/01/2023 Medical Rec #:  [id number]    Height:       76.0 in Accession #:    [id number]   Weight:       173.1 lb Date of Birth:  09-25-1985    BSA:          2.083 m Patient Age:    37 years     BP:           150/92 mmHg Patient Gender: M             HR:           82 bpm. Exam Location:  Inpatient Procedure: 2D Echo (Both Spectral and Color Flow Doppler were utilized during            procedure). Indications:    mitral valve disorder  History:        Patient has prior history of Echocardiogram examinations, most                 recent 07/21/2023. Arrythmias:Cardiac Arrest; Risk                 Factors:Hypertension.                  Mitral Valve: Mitra-Clip valve is present in the mitral                 position. Procedure Date: 07/16/2023.  Sonographer:    Delcie Roch RDCS Referring Phys: [phone] ALMA L DIAZ IMPRESSIONS  1. Left ventricular ejection fraction, by estimation, is 40 to 45%. Left ventricular ejection fraction by PLAX is 41 %. The left ventricle has mildly decreased function. The left ventricle demonstrates global hypokinesis. There is mild left ventricular hypertrophy. Left ventricular diastolic parameters are consistent with Grade I diastolic dysfunction (impaired relaxation).  2. Right ventricular systolic function is normal. The right ventricular size is normal. Tricuspid regurgitation signal is inadequate for assessing PA pressure.  3. Left atrial size was mildly dilated.  4. Right atrial size was mildly dilated.  5. 2 Mitra-clips noted in the A2-P2 position. The valve is hypermobile with the SAM of the anterior mitral leaflet, but no outflow obstruction - there is moderate stenosis with an expected double oreface valve. The mitral valve has been repaired/replaced. Mild mitral valve regurgitation. Moderate mitral stenosis. The mean mitral valve gradient is 7.0 mmHg with average heart rate of 79 bpm. There is a Mitra-Clip present in the mitral position. Procedure Date: 07/16/2023.  6. The aortic valve  is tricuspid. Aortic valve regurgitation is trivial. Aortic valve sclerosis/calcification is present, without any evidence of aortic stenosis.  7. The inferior vena cava is normal in size with greater than 50% respiratory variability,  suggesting right atrial pressure of 3 mmHg.  8. Evidence of atrial level shunting detected by color flow Doppler. There is a small secundum atrial septal defect with predominantly left to right shunting across the atrial septum. Comparison(s): Changes from prior study are noted. 07/21/2023: LVEF 35-40%. FINDINGS  Left Ventricle: Left ventricular ejection fraction, by estimation, is 40 to 45%. Left ventricular ejection fraction by PLAX is 41 %. The left ventricle has mildly decreased function. The left ventricle demonstrates global hypokinesis. The left ventricular internal cavity size was normal in size. There is mild left ventricular hypertrophy. Left ventricular diastolic parameters are consistent with Grade I diastolic dysfunction (impaired relaxation). Indeterminate filling pressures. Right Ventricle: The right ventricular size is normal. No increase in right ventricular wall thickness. Right ventricular systolic function is normal. Tricuspid regurgitation signal is inadequate for assessing PA pressure. Left Atrium: Left atrial size was mildly dilated. Right Atrium: Right atrial size was mildly dilated. Pericardium: There is no evidence of pericardial effusion. Mitral Valve: 2 Mitra-clips noted in the A2-P2 position. The valve is hypermobile with the SAM of the anterior mitral leaflet, but no outflow obstruction - there is moderate stenosis with an expected double oreface valve. The mitral valve has been repaired/replaced. Mild mitral valve regurgitation. There is a Mitra-Clip present in the mitral position. Procedure Date: 07/16/2023. Moderate mitral valve stenosis. MV peak gradient, 15.1 mmHg. The mean mitral valve gradient is 7.0 mmHg with average heart  rate of 79 bpm. Tricuspid Valve: The tricuspid valve is grossly normal. Tricuspid valve regurgitation is trivial. Aortic Valve: The aortic valve is tricuspid. Aortic valve regurgitation is trivial. Aortic valve sclerosis/calcification is present, without any  evidence of aortic stenosis. Pulmonic Valve: The pulmonic valve was normal in structure. Pulmonic valve regurgitation is not visualized. Aorta: The aortic root and ascending aorta are structurally normal, with no evidence of dilitation. Venous: The inferior vena cava is normal in size with greater than 50% respiratory variability, suggesting right atrial pressure of 3 mmHg. IAS/Shunts: Evidence of atrial level shunting detected by color flow Doppler. There is a small secundum atrial septal defect with predominantly left to right shunting across the atrial septum.  LEFT VENTRICLE PLAX 2D LV EF:         Left            Diastology                ventricular     LV e' medial:    5.98 cm/s                ejection        LV E/e' medial:  23.9                fraction by     LV e' lateral:   7.62 cm/s                PLAX is 41      LV E/e' lateral: 18.8                %. LVIDd:         5.50 cm LVIDs:         4.40 cm LV PW:         1.00 cm LV IVS:  1.10 cm LVOT diam:     2.60 cm LV SV:         104 LV SV Index:   50 LVOT Area:     5.31 cm  RIGHT VENTRICLE             IVC RV Basal diam:  3.10 cm     IVC diam: 1.80 cm RV S prime:     16.30 cm/s TAPSE (M-mode): 2.5 cm LEFT ATRIUM             Index        RIGHT ATRIUM           Index LA diam:        4.00 cm 1.92 cm/m   RA Area:     19.10 cm LA Vol (A2C):   84.7 ml 40.66 ml/m  RA Volume:   48.40 ml  23.23 ml/m LA Vol (A4C):   72.4 ml 34.76 ml/m LA Biplane Vol: 82.7 ml 39.70 ml/m  AORTIC VALVE LVOT Vmax:   130.00 cm/s LVOT Vmean:  85.000 cm/s LVOT VTI:    0.196 m  AORTA Ao Root diam: 3.20 cm Ao Asc diam:  3.50 cm MITRAL VALVE MV Area VTI:  1.94 cm      SHUNTS MV Peak grad: 15.1 mmHg     Systemic VTI:  0.20 m MV Mean grad: 7.0 mmHg      Systemic Diam: 2.60 cm MV Vmax:      1.94 m/s MV Vmean:     131.0 cm/s MV E velocity: 143.00 cm/s MV A velocity: 191.00 cm/s MV E/A ratio:  0.75 Zoila Shutter MD Electronically signed by Zoila Shutter MD Signature Date/Time:  08/01/2023/5:47:48 PM    Final    DG Abd 1 View Result Date: 07/31/2023 CLINICAL DATA:  Feeding tube placement. EXAM: ABDOMEN - 1 VIEW COMPARISON:  Abdominal radiograph dated 07/24/2023. FINDINGS: Feeding tube with tip in the region of the distal stomach. IMPRESSION: Feeding tube with tip in the distal stomach. Electronically Signed   By: Elgie Collard M.D.   On: 07/31/2023 09:37   DG CHEST PORT 1 VIEW Result Date: 07/31/2023 CLINICAL DATA:  Dialysis catheter dysfunction. EXAM: PORTABLE CHEST 1 VIEW COMPARISON:  Chest radiograph dated 07/25/2023. FINDINGS: Interval removal of the right-sided PICC and left IJ central venous line. Left subclavian catheter with tip over upper SVC. Feeding tube extends below the diaphragm with tip beyond the inferior margin of the image. No significant interval change in bilateral pulmonary opacities and small left pleural effusion. No pneumothorax. Stable cardiac silhouette. No acute osseous pathology. IMPRESSION: 1. Interval removal of the right-sided PICC and left IJ central venous line. 2. No significant interval change in bilateral pulmonary opacities and small left pleural effusion. Electronically Signed   By: Elgie Collard M.D.   On: 07/31/2023 09:37   DG Swallowing Func-Speech Pathology Result Date: 07/29/2023 Table formatting from the original result was not included. Modified Barium Swallow Study Patient Details Name: Aaron [name] MRN: [id number] Date of Birth: 1985-07-11 Today's Date: 07/29/2023 HPI/PMH: HPI: QUINTAN SALDIVAR is a 38 yo male presenting to ED 2/26 with chest pain and dyspnea. Admitted with bilateral lower lobe pulmonary emboli. VT/VF cardiac arrest 2/27, requiring 40 minutes of CPR and multiple shocks. Impella placed 2/27-3/12. ECMO initiated 2/27-3/8. CRRT stopped 3/15. ETT 2/27-3/12, reintubated 3/12-3/16 s/p mucus plugging and L lung opacification. 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 PMH includes hypothyroidism, paroxysmal A-fib, severe mitral regurgitation Clinical Impression: Pt exhibits severe pharyngeal dysphagia with concern for glottal insufficiency after prolonged intubation. He remains aphonic with a huff-like cough that is ineffective at clearing his airway. Thin and nectar thick liquids initially reached the level of the vocal folds before progressing further and being silently aspirated (PAS 8). A L and R head turn resulted in gross silent aspiration. Pt denies the urge to cough and his cued cough lacks crispness and strength. Recommend Dys 3 solids with honey thick liquids via cup or spoon and full supervision. Given difference in presentation when pt performed head turns in addition to aphonia, recommend ENT consult to assess vocal fold function. SLP will continue following. DIGEST Swallow Severity Rating*  Safety: 4  Efficiency: 1  Overall Pharyngeal Swallow Severity: 3 (severe) 1: mild; 2: moderate; 3: severe; 4: profound *The Dynamic Imaging Grade of Swallowing Toxicity is standardized for the head and neck cancer population, however, demonstrates promising clinical applications across populations to standardize the clinical rating of pharyngeal swallow safety and severity. Factors that may increase risk of adverse event in presence of aspiration Aaron [name] & Aaron [name] 2021): Factors that may increase risk of adverse event in presence of aspiration Aaron [name] & Aaron [name] 2021): Poor general health and/or compromised immunity; Frail or deconditioned; Presence of tubes (ETT, trach, NG, etc.) Recommendations/Plan: Swallowing Evaluation Recommendations Swallowing Evaluation Recommendations Recommendations: PO diet PO Diet Recommendation: Dysphagia 3 (Mechanical soft); Moderately thick liquids (Level 3, honey thick) Liquid Administration via: Spoon; Cup Medication Administration: Crushed with puree Supervision: Staff to assist with self-feeding; Full  supervision/cueing for swallowing strategies Swallowing strategies  : Minimize environmental distractions; Slow rate; Small bites/sips Postural changes: Position pt fully upright for meals; Stay upright 30-60 min after meals Oral care recommendations: Oral care QID (4x/day); Oral care before PO; Use suctioning for oral care Recommended consults: Consider ENT consultation Caregiver Recommendations: Avoid jello, ice cream, thin soups, popsicles; Remove water pitcher Treatment Plan Treatment Plan Treatment recommendations: Therapy as outlined in treatment plan below Follow-up recommendations: Acute inpatient rehab (3 hours/day) Functional status assessment: Patient has had a recent decline in their functional status and demonstrates the ability to make significant improvements in function in a reasonable and predictable amount of time. Treatment frequency: Min 2x/week Treatment duration: 2 weeks Interventions: Aspiration precaution training; Compensatory techniques; Patient/family education; Trials of upgraded texture/liquids; Diet toleration management by SLP; Respiratory muscle strength training Recommendations Recommendations for follow up therapy are one component of a multi-disciplinary discharge planning process, led by the attending physician.  Recommendations may be updated based on patient status, additional functional criteria and insurance authorization. Assessment: Orofacial Exam: Orofacial Exam Oral Cavity: Oral Hygiene: WFL Oral Cavity - Dentition: Adequate natural dentition Orofacial Anatomy: WFL Oral Motor/Sensory Function: WFL Anatomy: Anatomy: WFL Boluses Administered: Boluses Administered Boluses Administered: Thin liquids (Level 0); Mildly thick liquids (Level 2, nectar thick); Moderately thick liquids (Level 3, honey thick); Puree; Solid  Oral Impairment Domain: Oral Impairment Domain Lip Closure: Interlabial escape, no progression to anterior lip Tongue control during bolus hold: Cohesive bolus  between tongue to palatal seal Bolus preparation/mastication: Timely and efficient chewing and mashing Bolus transport/lingual motion: Brisk tongue motion Oral residue: Complete oral Aaron Location of oral residue : N/A Initiation of pharyngeal swallow : Posterior laryngeal surface of the epiglottis  Pharyngeal Impairment Domain: Pharyngeal Impairment Domain Soft palate elevation: No bolus between soft palate (SP)/pharyngeal wall (PW) Laryngeal elevation: Complete superior movement of thyroid cartilage with complete approximation  of arytenoids to epiglottic petiole Anterior hyoid excursion: Complete anterior movement Epiglottic movement: Complete inversion Laryngeal vestibule closure: Complete, no air/contrast in laryngeal vestibule Pharyngeal stripping wave : Present - complete Pharyngeal contraction (A/P view only): N/A Pharyngoesophageal segment opening: Complete distension and complete duration, no obstruction of flow Tongue base retraction: Trace column of contrast or air between tongue base and PPW Pharyngeal residue: Trace residue within or on pharyngeal structures Location of pharyngeal residue: Valleculae; Pyriform sinuses  Esophageal Impairment Domain: No data recorded Pill: No data recorded Penetration/Aspiration Scale Score: Penetration/Aspiration Scale Score 1.  Material does not enter airway: Moderately thick liquids (Level 3, honey thick); Puree; Solid 5.  Material enters airway, CONTACTS cords and not ejected out: Mildly thick liquids (Level 2, nectar thick) 8.  Material enters airway, passes BELOW cords without attempt by patient to eject out (silent aspiration) : Thin liquids (Level 0) Compensatory Strategies: Compensatory Strategies Compensatory strategies: Yes Straw: Ineffective Ineffective Straw: Thin liquid (Level 0); Mildly thick liquid (Level 2, nectar thick) Chin tuck: Effective; Ineffective Effective Chin Tuck: Mildly thick liquid (Level 2, nectar thick) Ineffective Chin Tuck: Thin  liquid (Level 0) Left head turn: Ineffective Ineffective Left Head Turn: Thin liquid (Level 0) Right head turn: Ineffective Ineffective Right Head Turn: Thin liquid (Level 0)   General Information: Caregiver present: No  Diet Prior to this Study: NPO; Cortrak/Small bore NG tube   Temperature : Normal   Respiratory Status: WFL   Supplemental O2: Nasal cannula   History of Recent Intubation: Yes  Behavior/Cognition: Alert; Cooperative; Requires cueing Self-Feeding Abilities: Dependent for feeding Baseline vocal quality/speech: Aphonic Volitional Cough: Able to elicit Volitional Swallow: Able to elicit Exam Limitations: No limitations Goal Planning: Prognosis for improved oropharyngeal function: Good Barriers to Reach Goals: Cognitive deficits; Time post onset; Severity of deficits No data recorded Patient/Family Stated Goal: none stated Consulted and agree with results and recommendations: Patient; Nurse Pain: Pain Assessment Pain Assessment: No/denies pain Faces Pain Scale: 0 End of Session: Start Time:SLP Start Time (ACUTE ONLY): 1255 Stop Time: SLP Stop Time (ACUTE ONLY): 1316 Time Calculation:SLP Time Calculation (min) (ACUTE ONLY): 21 min Charges: SLP Evaluations $ SLP Speech Visit: 1 Visit SLP Evaluations $BSS Swallow: 1 Procedure $MBS Swallow: 1 Procedure SLP visit diagnosis: SLP Visit Diagnosis: Dysphagia, pharyngeal phase (R13.13) Past Medical History: Past Medical History: Diagnosis Date  Atrial fibrillation (HCC)   Hyperthyroidism   Mitral regurgitation   NSTEMI (non-ST elevated myocardial infarction) [facility])  Past Surgical History: Past Surgical History: Procedure Laterality Date  ECMO CANNULATION N/A 07/10/2023  Procedure: ECMO CANNULATION;  Surgeon: Dolores Patty, MD;  Location: [facility] INVASIVE CV LAB;  Service: Cardiovascular;  Laterality: N/A;  EMBOLECTOMY Left 07/18/2023  Procedure: EMBOLECTOMY Left Femoral, Popliteal and iliac arterys,  Repair Left common femoral artery.;  Surgeon: Victorino Sparrow, MD;   Location: [facility] OR;  Service: Vascular;  Laterality: Left;  INTRAOPERATIVE TRANSESOPHAGEAL ECHOCARDIOGRAM N/A 07/23/2023  Procedure: ECHOCARDIOGRAM, TRANSESOPHAGEAL, INTRAOPERATIVE;  Surgeon: Loreli Slot, MD;  Location: [facility] OR;  Service: Open Heart Surgery;  Laterality: N/A;  LEFT HEART CATH AND CORONARY ANGIOGRAPHY N/A 10/22/2021  Procedure: LEFT HEART CATH AND CORONARY ANGIOGRAPHY;  Surgeon: Yvonne Kendall, MD;  Location: [facility] INVASIVE CV LAB;  Service: Cardiovascular;  Laterality: N/A;  PLACEMENT OF IMPELLA LEFT VENTRICULAR ASSIST DEVICE Right 07/14/2023  Procedure: PLACEMENT OF RIGHT AXILLARY IMPELLA 5.5 LEFT VENTRICULAR ASSIST DEVICE;  Surgeon: Loreli Slot, MD;  Location: [facility] OR;  Service: Open Heart Surgery;  Laterality: Right;  REMOVAL OF  IMPELLA LEFT VENTRICULAR ASSIST DEVICE Right 07/14/2023  Procedure: REMOVAL OF CP RIGHT FEMORAL IMPELLA LEFT VENTRICULAR ASSIST DEVICE;  Surgeon: Loreli Slot, MD;  Location: [facility] OR;  Service: Open Heart Surgery;  Laterality: Right;  REMOVAL OF IMPELLA LEFT VENTRICULAR ASSIST DEVICE N/A 07/23/2023  Procedure: REMOVAL, CARDIAC ASSIST DEVICE, IMPELLA;  Surgeon: Loreli Slot, MD;  Location: [facility] OR;  Service: Open Heart Surgery;  Laterality: N/A;  TRANSCATHETER MITRAL EDGE TO EDGE REPAIR N/A 07/16/2023  Procedure: TRANSCATHETER MITRAL EDGE TO EDGE REPAIR;  Surgeon: Tonny Bollman, MD;  Location: [facility] INVASIVE CV LAB;  Service: Cardiovascular;  Laterality: N/A;  TRANSESOPHAGEAL ECHOCARDIOGRAM (CATH LAB) N/A 07/16/2023  Procedure: TRANSESOPHAGEAL ECHOCARDIOGRAM;  Surgeon: Tonny Bollman, MD;  Location: [facility] INVASIVE CV LAB;  Service: Cardiovascular;  Laterality: N/A;  VENTRICULAR ASSIST DEVICE INSERTION N/A 07/10/2023  Procedure: VENTRICULAR ASSIST DEVICE INSERTION;  Surgeon: Dolores Patty, MD;  Location: [facility] IN[facility]AS[facility] CV LAB;  Service: Cardiovascular;  Laterality: N/A; Gwynneth Aliment, M.A., CF-SLP Speech Language Pathology, Acute Rehabilitation Services  Secure Chat preferred [phone] 07/29/2023, 1:49 PM  DG Chest Port 1 View Result Date: 07/25/2023 CLINICAL DATA:  ET tube placement EXAM: PORTABLE CHEST 1 VIEW COMPARISON:  X-ray 07/24/2023 and older FINDINGS: Stable ET tube, enteric tube, right-sided PICC, left IJ line and left subclavian line. Metallic foci overlie the left side of the heart as well. There are surgical clips and skin staples along the right shoulder, axillary region. Stable cardiopericardial silhouette with vascular congestion. Persistent left retrocardiac opacity. No pneumothorax. The inferior costophrenic angles also show a possible small left effusion. IMPRESSION: No significant interval change when adjusted for technique Electronically Signed   By: Karen Kays M.D.   On: 07/25/2023 10:13   DG Chest Port 1 View Result Date: 07/24/2023 CLINICAL DATA:  Status post central line placement. EXAM: PORTABLE CHEST 1 VIEW COMPARISON:  July 24, 2023 (1:24 a.m.) FINDINGS: Since the prior study there is been further advancement of the previously noted left-sided subclavian catheter. Its distal tip now sits within the distal aspect of the superior vena cava. The additional catheters, ET tube and orogastric tube seen on the prior study are unchanged in position. Since the prior exam there is improved aeration of the left upper lobe with persistent mild to moderate severity bilateral left perihilar and right infrahilar infiltrates. Moderate severity left basilar atelectasis and/or infiltrate is seen within the retrocardiac region of the left lung base. There is a small left pleural effusion. No pneumothorax is identified. The visualized skeletal structures are unremarkable. IMPRESSION: 1. Interval advancement of the left-sided subclavian catheter with its distal tip now within the distal aspect of the superior vena cava. 2. Improved aeration of the left upper lobe with persistent bilateral left perihilar and right infrahilar infiltrates. 3. Moderate  severity left basilar atelectasis and/or infiltrate. 4. Small left pleural effusion. Electronically Signed   By: Aram Candela M.D.   On: 07/24/2023 19:34   MR BRAIN WO CONTRAST Result Date: 07/24/2023 CLINICAL DATA:  Anoxic brain damage.  Cardiac arrest EXAM: MRI HEAD WITHOUT CONTRAST TECHNIQUE: Multiplanar, multiecho pulse sequences of the brain and surrounding structures were obtained without intravenous contrast. COMPARISON:  Head CT 07/10/2023 FINDINGS: Brain: Small acute infarcts in the right cerebral white matter and peripheral right cerebellum. No indication of superimposed global anoxic injury. No hydrocephalus, mass, or collection. Minimal blood products at right occipital white matter infarction attributed to petechial hemorrhage in this setting. Vascular: Major flow voids are preserved Skull and upper cervical spine: Normal  marrow signal Sinuses/Orbits: Extensive bilateral mastoid opacification in the setting of intubation. IMPRESSION: Small acute infarcts in the right cerebral and cerebellar hemispheres. No generalized finding to implicate global anoxic injury. Electronically Signed   By: Tiburcio Pea M.D.   On: 07/24/2023 10:33   DG Abd 1 View Result Date: 07/24/2023 CLINICAL DATA:  5626 Acute respiratory failure [facility]) 5626 [id number] Encounter for feeding tube placement [id number] EXAM: ABDOMEN - 1 VIEW COMPARISON:  X-ray abdomen 07/17/2023, chest abdomen pelvis 07/10/2023 FINDINGS: Enteric tube with tip and side port overlying the gastric lumen. The bowel gas pattern is normal. No radio-opaque calculi or other significant radiographic abnormality are seen. IMPRESSION: Enteric tube in good position. Electronically Signed   By: Tish Frederickson M.D.   On: 07/24/2023 02:48   DG Chest Port 1 View Result Date: 07/24/2023 CLINICAL DATA:  Acute respiratory failure EXAM: PORTABLE CHEST 1 VIEW COMPARISON:  Chest x-ray 07/23/2023.  Chest CT 07/10/2023. FINDINGS: Endotracheal tube tip is 5.2 cm above the  carina. Right-sided central venous catheter tip ends in the SVC. Left IJ catheter ends in the SVC. Left subclavian catheter ends in the level of the brachiocephalic vein, unchanged. Enteric tube extends below the diaphragm. The heart is enlarged, unchanged. There central pulmonary vascular congestion. There is improved aeration in the left lower lung. There is complete opacification of the upper right hemithorax similar to prior. There is a small layering left pleural effusion. Osseous structures are stable. IMPRESSION: 1. Improved aeration in the left lower lung. 2. Stable complete opacification of the upper right hemithorax. 3. Small layering left pleural effusion. 4. Stable cardiomegaly and central pulmonary vascular congestion. Electronically Signed   By: Darliss Cheney M.D.   On: 07/24/2023 02:32   DG Chest Port 1 View Result Date: 07/24/2023 CLINICAL DATA:  Acute respiratory failure, hypoxia EXAM: PORTABLE CHEST 1 VIEW COMPARISON:  07/22/2023 FINDINGS: There is abrupt cut off of the left mainstem bronchus and there is now complete atelectasis of the left lung with marked mediastinal shift to the left and complete opacification of left hemithorax. The findings suggest a obstructing endobronchial lesion such as a mucous plug or aspirated foreign body. Left subclavian temporary hemodialysis catheter left internal jugular central venous catheter are unchanged in position though deviated to the left by mediastinal shift. Right upper extremity PICC line has been placed with its tip overlying the expected superior vena cava. Right lung is clear. No pneumothorax. No pleural effusion right. IMPRESSION: 1. Complete atelectasis of the left lung with marked mediastinal shift to the left and complete opacification of left hemithorax. The findings suggest a obstructing endobronchial lesion such as a mucous plug or aspirated foreign body. 2. Interval placement of right upper extremity PICC line, tip within the superior  vena cava. Electronically Signed   By: Helyn Numbers M.D.   On: 07/24/2023 00:53   [place] EKG SITE RITE Result Date: 07/23/2023 If Site Rite image not attached, placement could not be confirmed due to current cardiac rhythm.  DG Chest Port 1 View Result Date: 07/22/2023 CLINICAL DATA:  Follow-up left basilar atelectasis EXAM: PORTABLE CHEST 1 VIEW COMPARISON:  07/20/2023 FINDINGS: Endotracheal tube and gastric catheter are noted in satisfactory position. Swan-Ganz catheter is noted in the pulmonary outflow tract. Impella catheter is noted from the right arm. Left jugular central line is noted at the cavoatrial junction. Temporary dialysis catheter is noted on the left with the tip in the left innominate vein. The lungs are well aerated bilaterally. Persistent left retrocardiac  density is noted. Mitra clips are seen. IMPRESSION: Tubes and lines as described above. Persistent left basilar consolidation. Electronically Signed   By: Alcide Clever M.D.   On: 07/22/2023 10:39   ECHOCARDIOGRAM LIMITED Result Date: 07/21/2023    ECHOCARDIOGRAM LIMITED REPORT   Patient Name:   Aaron [name] Date of Exam: 07/21/2023 Medical Rec #:  [id number]    Height:       76.0 in Accession #:    [id number]   Weight:       200.2 lb Date of Birth:  [birth year]    BSA:          2.216 m Patient Age:    37 years     BP:           0/0 mmHg Patient Gender: M            HR:           92 bpm. Exam Location:  Inpatient Procedure: Limited Echo, Limited Color Doppler and Cardiac Doppler (Both            Spectral and Color Flow Doppler were utilized during procedure). Indications:    CHF  History:        Patient has no prior history of Echocardiogram examinations and                 Patient has prior history of Echocardiogram examinations, most                 recent 07/19/2023. Mitral Valve Prolapse, Arrythmias:Cardiac                 Arrest and Atrial Fibrillation; Risk Factors:Hypertension.  Sonographer:    Amy Chionchio Referring Phys: [id number]  S MCLEAN IMPRESSIONS  1. Impella 5.5 in LV, inflow is about 5.5 cm from the aortic valve. Position looks acceptable. Left ventricular ejection fraction, by estimation, is 35 to 40%. The left ventricle has moderately decreased function. The left ventricle demonstrates regional wall motion abnormalities with anterior and septal hypokinesis. There is mild concentric left ventricular hypertrophy.  2. Right ventricular systolic function is normal. The right ventricular size is normal.  3. S/p Mitraclip with mild residual mitral regurgitation. Mean gradient 7 mmHg.  4. Limited echo for Impella. FINDINGS  Left Ventricle: Impella 5.5 in LV, inflow is about 5.5 cm from the aortic valve. Position looks acceptable. Left ventricular ejection fraction, by estimation, is 35 to 40%. The left ventricle has moderately decreased function. The left ventricle demonstrates regional wall motion abnormalities. The left ventricular internal cavity size was normal in size. There is mild concentric left ventricular hypertrophy. Right Ventricle: The right ventricular size is normal. Right ventricular systolic function is normal. Left Atrium: Left atrial size was normal in size. Right Atrium: Right atrial size was normal in size. Mitral Valve: S/p Mitraclip with mild residual mitral regurgitation. Mean gradient 7 mmHg. There is a Mitra-Clip present in the mitral position. MV peak gradient, 11.8 mmHg. The mean mitral valve gradient is 6.5 mmHg. Aorta: The aortic root and ascending aorta are structurally normal, with no evidence of dilitation. LEFT VENTRICLE PLAX 2D LVIDd:         4.60 cm LVIDs:         4.10 cm LV PW:         1.20 cm LV IVS:        1.20 cm  RIGHT VENTRICLE RV Basal diam:  3.70 cm RV Mid diam:    3.30  cm RV S prime:     13.40 cm/s TAPSE (M-mode): 1.9 cm RIGHT ATRIUM           Index RA Area:     16.70 cm RA Volume:   50.10 ml  22.61 ml/m   AORTA Ao Asc diam: 3.20 cm MITRAL VALVE MV Peak grad: 11.8 mmHg MV Mean grad: 6.5 mmHg MV  Vmax:      1.72 m/s MV Vmean:     122.0 cm/s Dalton McleanMD Electronically signed by Wilfred Lacy Signature Date/Time: 07/21/2023/2:57:35 PM    Final    ECHOCARDIOGRAM LIMITED Result Date: 07/21/2023    ECHOCARDIOGRAM LIMITED REPORT   Patient Name:   Aaron [name] Date of Exam: 07/20/2023 Medical Rec #:  [id number]    Height:       76.0 in Accession #:    [id number]   Weight:       199.1 lb Date of Birth:  [birth year]    BSA:          2.211 m Patient Age:    37 years     BP:           141/71 mmHg Patient Gender: M            HR:           58 bpm. Exam Location:  Inpatient Procedure: 2D Echo, Cardiac Doppler and Color Doppler (Both Spectral and Color            Flow Doppler were utilized during procedure). Indications:    I42.9 Cardiomyopathy (unspecified)  History:        Patient has prior history of Echocardiogram examinations, most                 recent 07/18/2023. Acute MI, Abnormal ECG, Mitral Valve Disease,                 Arrythmias:Atrial Fibrillation and Cardiac Arrest;                 Signs/Symptoms:Chest Pain. ECMO. Impella device present. S/P                 MTEER X3 XTW clips. Pulmonary embolus. Cardiac shock.                  Mitral Valve: Mitra-Clip valve is present in the mitral                 position.  Sonographer:    Sheralyn Boatman RDCS Referring Phys: [id number] [facility]  Sonographer Comments: Technically difficult study due to poor echo windows and echo performed with patient supine and on artificial respirator. IMPRESSIONS  1. Impella 3.7cm from the aortic valve annulus. Left ventricular ejection fraction, by estimation, is 35 to 40%. The left ventricle has moderately decreased function.  2. Right ventricular systolic function is mildly reduced. The right ventricular size is mildly enlarged.  3. Left atrial size was mildly dilated.  4. Right atrial size was mildly dilated.  5. The mitral valve has been repaired/replaced. Trivial mitral valve regurgitation. There is a Mitra-Clip present in the  mitral position.  6. The inferior vena cava is dilated in size with <50% respiratory variability, suggesting right atrial pressure of 15 mmHg. FINDINGS  Left Ventricle: Impella 3.7cm from the aortic valve annulus. Left ventricular ejection fraction, by estimation, is 35 to 40%. The left ventricle has moderately decreased function. Right Ventricle: The right ventricular size is mildly enlarged. Right ventricular systolic function is  mildly reduced. Left Atrium: Left atrial size was mildly dilated. Right Atrium: Right atrial size was mildly dilated. Pericardium: There is no evidence of pericardial effusion. Mitral Valve: The mitral valve has been repaired/replaced. Trivial mitral valve regurgitation. There is a Mitra-Clip present in the mitral position. Venous: The inferior vena cava is dilated in size with less than 50% respiratory variability, suggesting right atrial pressure of 15 mmHg. Additional Comments: Spectral Doppler performed. Color Doppler performed.  LEFT VENTRICLE PLAX 2D LVIDd:         5.00 cm LVIDs:         4.20 cm LV PW:         1.30 cm LV IVS:        1.70 cm  IVC IVC diam: 3.10 cm Aditya Sabharwal Electronically signed by Dorthula Nettles Signature Date/Time: 07/21/2023/10:51:41 AM    Final    DG Chest Port 1 View Result Date: 07/20/2023 CLINICAL DATA:  21308 ARDS (adult respiratory distress syndrome) (HCC) 65784 EXAM: PORTABLE CHEST 1 VIEW COMPARISON:  July 19, 2023, March seventh 2025 FINDINGS: The cardiomediastinal silhouette is unchanged in contour.Impella catheter. ETT tip terminates 6.4 cm above the carina. RIGHT IJ PA catheter tip terminates over the RIGHT main pulmonary artery proximally. LEFT IJ CVC tip terminates over the superior cavoatrial junction. LEFT subclavian CVC tip terminates over the LEFT brachiocephalic vein. The enteric tube courses through the chest to the abdomen beyond the field-of-view. Trace LEFT pleural effusion. No pneumothorax. Interval marked improvement in aeration  in the LEFT lung with mild residual LEFT retrocardiac opacity likely reflecting residual atelectasis. Some patchy reticulonodular opacities persist in the bilateral perihilar areas, nonspecific. Enteric contrast delineates the stomach. IMPRESSION: 1. Support apparatus as described above. 2. Interval marked improvement in aeration in the LEFT lung with mild residual LEFT retrocardiac opacity likely reflecting residual atelectasis. 3. Some patchy reticulonodular opacities persist in the bilateral perihilar areas, nonspecific. Electronically Signed   By: Meda Klinefelter M.D.   On: 07/20/2023 10:16   DG CHEST PORT 1 VIEW Result Date: 07/19/2023 CLINICAL DATA:  Line placement EXAM: PORTABLE CHEST 1 VIEW COMPARISON:  X-ray earlier 07/19/2023 at 9:38 a.m. Older exams as well FINDINGS: Stable ET tube, enteric tube, left IJ line. The Swan-Ganz catheter via the right IJ is more in the right pulmonary artery. There is a ECMO catheter in place along the cardiac silhouette. Interval placement of a left subclavian line with tip overlying the upper mediastinum. This is not cross to the right side. Exact tip location is uncertain. There is increasing opacification along left hemithorax with increasing fluid. The heart is enlarged. Vascular congestion of the right lung. The right lateral thorax sick edge is clipped off the edge of the film. No right-sided pneumothorax or large effusion. Critical Value/emergent results were called by telephone at the time of interpretation on 07/19/2023 at 11:21 am to nurse Toni Amend, who verbally acknowledged these results. IMPRESSION: New left subclavian line with tip overlying the upper mediastinum. The exact tip location is uncertain and there is increasing left pleural effusion and lung opacity. There is also some shift of the mediastinum from right to left. Component of volume loss is possible. Please correlate with blood return and additional workup for catheter location. Advancement of the  Swan-Ganz catheter now with tip along the right pulmonary artery. Electronically Signed   By: Karen Kays M.D.   On: 07/19/2023 11:46   DG CHEST PORT 1 VIEW Result Date: 07/19/2023 CLINICAL DATA:  Shortness of breath. EXAM: PORTABLE  CHEST 1 VIEW COMPARISON:  X-ray 07/19/2023 at 9:36 a.m. FINDINGS: Interval retraction of the Swan-Ganz catheter via the right IJ now with tip along the main pulmonary artery. Stable ET tube, enteric tube, left IJ catheter and ECMO catheter. Enlarged heart with vascular congestion and some edema. Persistent left retrocardiac opacity and effusion. No pneumothorax. Overlapping cardiac leads. IMPRESSION: Interval retraction of the Swan-Ganz catheter with tip overlying the main pulmonary artery. Electronically Signed   By: Karen Kays M.D.   On: 07/19/2023 11:24   DG CHEST PORT 1 VIEW Result Date: 07/19/2023 CLINICAL DATA:  Shortness of breath EXAM: PORTABLE CHEST 1 VIEW COMPARISON:  X-ray 07/18/2023 and older FINDINGS: Stable ET tube, enteric tube. Right IJ Swan-Ganz catheter with tip in the right lung hilum, somewhat more peripheral than usually seen and advanced from previous. Left IJ line with tip along the central SVC. Separate right subclavian presumed ECMO catheter. Enlarged heart with vascular congestion and trace edema. Confluent left lung base opacity with left effusion. No pneumothorax. Overlapping cardiac leads. Density in the upper abdomen on the left. This could be contrast in the stomach or other process. IMPRESSION: Mass with the Swan-Ganz catheter further into the right lung hilum of the upper lobe. Numerous other tubes and lines again seen. Enlarged heart with vascular congestion and some developing edema. Persistent left lung base opacity with increasing left pleural effusion. Electronically Signed   By: Karen Kays M.D.   On: 07/19/2023 11:23   HYBRID OR IMAGING ([facility] ONLY) Result Date: 07/18/2023 There is no interpretation for this exam.  This order is for images  obtained during a surgical procedure.  Please See "Surgeries" Tab for more information regarding the procedure.   ECHOCARDIOGRAM LIMITED Result Date: 07/18/2023    ECHOCARDIOGRAM LIMITED REPORT   Patient Name:   Aaron [name] Date of Exam: 07/18/2023 Medical Rec #:  [id number]    Height:       76.0 in Accession #:    [id number]   Weight:       174.4 lb Date of Birth:  July 25, 1985    BSA:          2.090 m Patient Age:    37 years     BP:           119/58 mmHg Patient Gender: M            HR:           67 bpm. Exam Location:  Inpatient Procedure: Limited Echo (Both Spectral and Color Flow Doppler were utilized            during procedure). Indications:    ECMO  History:        Patient has prior history of Echocardiogram examinations, most                 recent 07/17/2023.  Sonographer:    Karma Ganja Referring Phys: [id number] ADITYA SABHARWAL IMPRESSIONS  1. Impella device noted 5 cm distal to the aortic annulus No significant change in RV/LV size/functoin with ECMO flow clamped. Left ventricular ejection fraction, by estimation, is 25%. The left ventricle has moderately decreased function. The left ventricle demonstrates global hypokinesis. The left ventricular internal cavity size was moderately dilated.  2. Right ventricular systolic function is moderately reduced. The right ventricular size is moderately enlarged.  3. Post mTEER with 3 XTW clips no color flow/doppler perfomred . The mitral valve has been repaired/replaced. FINDINGS  Left Ventricle: Impella device noted 5 cm distal  to the aortic annulus No significant change in RV/LV size/functoin with ECMO flow clamped. Left ventricular ejection fraction, by estimation, is 25%. The left ventricle has moderately decreased function. The left ventricle demonstrates global hypokinesis. The left ventricular internal cavity size was moderately dilated. Right Ventricle: The right ventricular size is moderately enlarged. Right ventricular systolic function is moderately  reduced. Pericardium: There is no evidence of pericardial effusion. Mitral Valve: Post mTEER with 3 XTW clips no color flow/doppler perfomred. The mitral valve has been repaired/replaced. LEFT VENTRICLE PLAX 2D LVIDd:         0.09 cm LV IVS:        9 mm  Charlton Haws MD Electronically signed by Charlton Haws MD Signature Date/Time: 07/18/2023/12:50:15 PM    Final    DG CHEST PORT 1 VIEW Result Date: 07/18/2023 CLINICAL DATA:  ECMO. EXAM: PORTABLE CHEST 1 VIEW COMPARISON:  July 17, 2023. FINDINGS: Stable cardiomegaly. Impella device is unchanged in position. Endotracheal nasogastric tubes are unchanged. Right internal jugular Swan-Ganz catheter is unchanged. Right lung is clear. Mild left basilar atelectasis is noted with possible effusion, although left lung base is not completely included in field-of-view. IMPRESSION: Stable support apparatus. Probable mild left basilar atelectasis is noted with small pleural effusion, although left lung base is not completely included in field-of-view. Electronically Signed   By: Lupita Raider M.D.   On: 07/18/2023 09:59   ECHOCARDIOGRAM COMPLETE Result Date: 07/17/2023    ECHOCARDIOGRAM REPORT   Patient Name:   Aaron [name] Date of Exam: 07/17/2023 Medical Rec #:  [id number]    Height:       76.0 in Accession #:    [id number]   Weight:       172.6 lb Date of Birth:  1985-09-08    BSA:          2.081 m Patient Age:    37 years     BP:           101/68 mmHg Patient Gender: M            HR:           66 bpm. Exam Location:  Inpatient Procedure: 2D Echo, Cardiac Doppler, Color Doppler and Intracardiac            Opacification Agent (Both Spectral and Color Flow Doppler were            utilized during procedure). Indications:    S/P mitral valve repair  History:        Patient has prior history of Echocardiogram examinations, most                 recent 03/20/2023. Arrythmias:Atrial Flutter,                 Signs/Symptoms:Chest Pain; Risk Factors:Hypertension. Cardiac                  arrest, cardiogenic shock.  Sonographer:    Vern Claude Referring Phys: [id number] KATHRYN R THOMPSON IMPRESSIONS  1. Impella Canula appears reasonably positioned. ECMO support not well assessed in this study. Left ventricular ejection fraction, by estimation, is <20%. The left ventricle has severely decreased function. The left ventricle demonstrates global hypokinesis. There is mild left ventricular hypertrophy. Left ventricular diastolic parameters are indeterminate.  2. TAPSE is now normal, Basal function has improved. Right ventricular systolic function is moderately reduced. The right ventricular size is severely enlarged.  3. Large pleural effusion.  4. There are three MitraClip  XTWs placed centrally. Trivial regurgitation. Mean gradient 1 mm Hg. No SLD. The mitral valve has been repaired/replaced. Trivial mitral valve regurgitation. No evidence of mitral stenosis. The mean mitral valve gradient is 1.0 mmHg.  5. The aortic valve is tricuspid. Aortic valve regurgitation is not visualized. No aortic stenosis is present.  6. The inferior vena cava is normal in size with greater than 50% respiratory variability, suggesting right atrial pressure of 3 mmHg.  7. Evidence of atrial level shunting detected by color flow Doppler. Comparison(s): Prior images reviewed side by side. LV is less dilated. Successful mTEER. RV function has improved from prior imaging. FINDINGS  Left Ventricle: Impella Canula appears reasonably positioned. ECMO support not well assessed in this study. Left ventricular ejection fraction, by estimation, is <20%. The left ventricle has severely decreased function. The left ventricle demonstrates global hypokinesis. Strain was performed and the global longitudinal strain is indeterminate. The left ventricular internal cavity size was normal in size. There is mild left ventricular hypertrophy. Left ventricular diastolic parameters are indeterminate. Right Ventricle: TAPSE is now normal, Basal  function has improved. The right ventricular size is severely enlarged. No increase in right ventricular wall thickness. Right ventricular systolic function is moderately reduced. Left Atrium: Left atrial size was normal in size. Right Atrium: Right atrial size was normal in size. Pericardium: There is no evidence of pericardial effusion. Mitral Valve: There are three MitraClip XTWs placed centrally. Trivial regurgitation. Mean gradient 1 mm Hg. No SLD. The mitral valve has been repaired/replaced. Trivial mitral valve regurgitation. No evidence of mitral valve stenosis. MV peak gradient, 2.5 mmHg. The mean mitral valve gradient is 1.0 mmHg. Tricuspid Valve: The tricuspid valve is normal in structure. Tricuspid valve regurgitation is trivial. No evidence of tricuspid stenosis. Aortic Valve: The aortic valve is tricuspid. Aortic valve regurgitation is not visualized. No aortic stenosis is present. Aortic valve mean gradient measures 0.0 mmHg. Aortic valve peak gradient measures 1.1 mmHg. Aortic valve area, by VTI measures 3.15 cm. Pulmonic Valve: The pulmonic valve was normal in structure. Pulmonic valve regurgitation is not visualized. No evidence of pulmonic stenosis. Aorta: The aortic root and ascending aorta are structurally normal, with no evidence of dilitation. Venous: The inferior vena cava is normal in size with greater than 50% respiratory variability, suggesting right atrial pressure of 3 mmHg. IAS/Shunts: Evidence of atrial level shunting detected by color flow Doppler. Additional Comments: 3D was performed not requiring image post processing on an independent workstation and was indeterminate. There is a large pleural effusion.  LEFT VENTRICLE PLAX 2D LVIDd:         4.30 cm      Diastology LVIDs:         3.80 cm      LV e' medial:    6.83 cm/s LV PW:         1.20 cm      LV E/e' medial:  8.5 LV IVS:        1.10 cm      LV e' lateral:   9.75 cm/s LVOT diam:     2.20 cm      LV E/e' lateral: 5.9 LV SV:          24 LV SV Index:   11 LVOT Area:     3.80 cm  LV Volumes (MOD) LV vol d, MOD A4C: 163.0 ml LV vol s, MOD A4C: 111.0 ml LV SV MOD A4C:     163.0 ml RIGHT VENTRICLE  IVC RV Basal diam:  5.10 cm    IVC diam: 1.40 cm RV Mid diam:    3.90 cm RV S prime:     6.76 cm/s TAPSE (M-mode): 1.5 cm LEFT ATRIUM           Index        RIGHT ATRIUM           Index LA diam:      2.70 cm 1.30 cm/m   RA Area:     17.60 cm LA Vol (A2C): 53.5 ml 25.71 ml/m  RA Volume:   51.20 ml  24.61 ml/m LA Vol (A4C): 22.2 ml 10.67 ml/m  AORTIC VALVE                    PULMONIC VALVE AV Area (Vmax):    3.46 cm     PV Vmax:       0.61 m/s AV Area (Vmean):   3.41 cm     PV Peak grad:  1.5 mmHg AV Area (VTI):     3.15 cm AV Vmax:           53.50 cm/s AV Vmean:          28.300 cm/s AV VTI:            0.075 m AV Peak Grad:      1.1 mmHg AV Mean Grad:      0.0 mmHg LVOT Vmax:         48.70 cm/s LVOT Vmean:        25.400 cm/s LVOT VTI:          0.062 m LVOT/AV VTI ratio: 0.83  AORTA Ao Root diam: 3.50 cm Ao Asc diam:  3.00 cm MITRAL VALVE MV Area (PHT): 1.99 cm    SHUNTS MV Area VTI:   0.90 cm    Systemic VTI:  0.06 m MV Peak grad:  2.5 mmHg    Systemic Diam: 2.20 cm MV Mean grad:  1.0 mmHg MV Vmax:       0.79 m/s MV Vmean:      55.6 cm/s MV Decel Time: 382 msec MV E velocity: 57.90 cm/s MV A velocity: 91.20 cm/s MV E/A ratio:  0.63 Riley Lam MD Electronically signed by Riley Lam MD Signature Date/Time: 07/17/2023/10:52:00 AM    Final    DG Abd 1 View Result Date: 07/17/2023 CLINICAL DATA:  OG tube placement EXAM: ABDOMEN - 1 VIEW COMPARISON:  07/16/2023, 07/15/2023, 07/13/2023 FINDINGS: Enteric tube tip and side port overlie the proximal stomach. ECMO catheter tip overlies the intra hepatic IVC. Radiopaque material overlying left upper quadrant is unchanged. IMPRESSION: 1. Enteric tube tip and side port overlie the proximal stomach. 2. Persistent left retrocardiac opacity Electronically Signed   By: Jasmine Pang M.D.   On: 07/17/2023 01:37   DG CHEST PORT 1 VIEW Result Date: 07/17/2023 CLINICAL DATA:  OG tube placement EXAM: PORTABLE CHEST 1 VIEW COMPARISON:  07/16/2023 FINDINGS: Endotracheal tube tip is about 4.4 cm superior to the carina. Left IJ central venous catheter tip at the cavoatrial junction. Right IJ Swan-Ganz catheter tip at the right pulmonary artery. Impella device similar in position. Enteric tube tip below the diaphragm but incompletely visualized. Cardiomegaly. Persistent consolidation at the left lung base. IMPRESSION: 1. Support lines and tubes as above. 2. Cardiomegaly with persistent airspace duct stones at the left base. Electronically Signed   By: Jasmine Pang M.D.   On: 07/17/2023 01:35   DG  Abd 1 View Result Date: 07/16/2023 CLINICAL DATA:  Orogastric tube placement. EXAM: ABDOMEN - 1 VIEW COMPARISON:  None Available. FINDINGS: Tip of the enteric tube below the diaphragm in the stomach, the side port is at the level of the gastroesophageal junction. Advancement of least 4 cm is recommended to place the side-port below the diaphragm. ECMO catheter from an inferior approach. Swan-Ganz catheter and intra-aortic balloon pump partially included in the field of view. Retrocardiac opacity again seen. IMPRESSION: Tip of the enteric tube below the diaphragm in the stomach, the side port is at the level of the gastroesophageal junction. Advancement of at least 4 cm is recommended to place the side-port below the diaphragm. Electronically Signed   By: Narda Rutherford M.D.   On: 07/16/2023 22:18   ECHO TEE Result Date: 07/16/2023    TRANSESOPHOGEAL ECHO REPORT   Patient Name:   Aaron [name] Date of Exam: 07/16/2023 Medical Rec #:  [id number]    Height:       76.0 in Accession #:    [id number]   Weight:       176.6 lb Date of Birth:  [birth year]    BSA:          2.101 m Patient Age:    37 years     BP:           93/82 mmHg Patient Gender: M            HR:           61 bpm. Exam Location:  Inpatient  Procedure: Transesophageal Echo, 3D Echo, Cardiac Doppler and Color Doppler            (Both Spectral and Color Flow Doppler were utilized during            procedure). Indications:     MitraClip  History:         Patient has prior history of Echocardiogram examinations, most                  recent 07/14/2023. CHF, Acute MI; Mitral Valve Disease.  Sonographer:     Darlys Gales Referring Phys:  [id number] Wille Celeste THOMPSON Diagnosing Phys: Riley Lam MD PROCEDURE: After discussion of the risks and benefits of a TEE, an informed consent was obtained. The transesophogeal probe was passed without difficulty through the esophogus of the patient. Sedation performed by different physician. The patient developed no complications during the procedure.  IMPRESSIONS  1. ECMO and Impella support cannulas present. Settings change throughout the procedure for optimal loading. LV decreased in size from start to end of case. Left ventricular ejection fraction, by estimation, is <20%. The left ventricle has severely decreased function. The left ventricular internal cavity size was severely dilated.  2. Right ventricular systolic function is severely reduced. The right ventricular size is moderately enlarged.  3. Left atrial size was severely dilated. No left atrial/left atrial appendage thrombus was detected.  4. Right atrial size was mild to moderately dilated.  5. Moderate pericardial effusion. The pericardial effusion is posterior to the left ventricle.  6. Prior to procedure- severe mitral valve regurgitation. 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 PV flow reversal. Mean gradient 1 mm Hg with no evidence of mitral stenosis.     During the procedure, three XTW Mitra Clips were placed. First A-2/P2, second lateral to this (slightly unparallel due to being stuck in the P2 apparatus, third lateral to  this. Mitral regurgitation become moderate, mild, then  trivial.     After procedure, a mean gradient of 2 mm HG was present. Slight increase in pericardial effusion size without any evidence of tamponade. Residual shunting all left to right. Patient tolerated the procedure well with no immediate complications. The mitral valve is abnormal. Trivial mitral valve regurgitation. No evidence of mitral stenosis.  7. The aortic valve is tricuspid. Aortic valve regurgitation is trivial. No aortic stenosis is present.  8. 3D performed of the mitral valve and demonstrates 3D MPR used to line up 3 MitraClips.  9. Evidence of atrial level shunting detected by color flow Doppler. FINDINGS  Left Ventricle: ECMO and Impella support cannulas present. Settings change throughout the procedure for optimal loading. LV decreased in size from start to end of case. Left ventricular ejection fraction, by estimation, is <20%. The left ventricle has severely decreased function. The left ventricular internal cavity size was severely dilated. Right Ventricle: The right ventricular size is moderately enlarged. No increase in right ventricular wall thickness. Right ventricular systolic function is severely reduced. Left Atrium: Left atrial size was severely dilated. No left atrial/left atrial appendage thrombus was detected. Right Atrium: Right atrial size was mild to moderately dilated. Pericardium: A moderately sized pericardial effusion is present. The pericardial effusion is posterior to the left ventricle. Mitral Valve: Prior to procedure- severe mitral valve regurgitation. 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 PV flow reversal. Mean gradient 1 mm Hg with no evidence of mitral stenosis. During the procedure, three XTW Mitra Clips were placed. First A-2/P2, second lateral to this (slightly unparallel due to being stuck in the P2 apparatus, third lateral to this. Mitral regurgitation become moderate, mild, then trivial. After  procedure, a mean gradient of 2 mm HG was present. Slight increase in pericardial effusion size without any evidence of tamponade. Residual shunting all left to right. Patient tolerated the procedure well with no immediate complications. The mitral  valve is abnormal. Trivial mitral valve regurgitation. No evidence of mitral valve stenosis. MV peak gradient, 3.4 mmHg. The mean mitral valve gradient is 1.6 mmHg. Tricuspid Valve: The tricuspid valve is normal in structure. Tricuspid valve regurgitation is mild . No evidence of tricuspid stenosis. Aortic Valve: The aortic valve is tricuspid. Aortic valve regurgitation is trivial. No aortic stenosis is present. Pulmonic Valve: The pulmonic valve was normal in structure. Pulmonic valve regurgitation is trivial. Aorta: The aortic root, ascending aorta, aortic arch and descending aorta are all structurally normal, with no evidence of dilitation or obstruction. IAS/Shunts: Evidence of atrial level shunting detected by color flow Doppler. Additional Comments: 3D was performed not requiring image post processing on an independent workstation and was indeterminate. MITRAL VALVE MV Peak grad: 3.4 mmHg MV Mean grad: 1.6 mmHg MV Vmax:      0.93 m/s MV Vmean:     59.5 cm/s Riley Lam MD Electronically signed by Riley Lam MD Signature Date/Time: 07/16/2023/4:05:02 PM    Final    EP STUDY Result Date: 07/16/2023 See surgical note for result.  Structural Heart Procedure Result Date: 07/16/2023 See surgical note for result.  DG CHEST PORT 1 VIEW Result Date: 07/16/2023 CLINICAL DATA:  ECMO.  Bilateral pulmonary embolism. EXAM: PORTABLE CHEST 1 VIEW COMPARISON:  One-view chest x-ray 07/15/2023. FINDINGS: The heart is mildly enlarged. Endotracheal tube is stable. Left IJ line is stable. Swan-Ganz catheter is stable terminating at the right main pulmonary outflow tract. Impella device is  stable. ECMO catheter is present in the inferior IVC. Mild pulmonary  vascular congestion is stable. A left pleural effusion is suspected. IMPRESSION: 1. Stable support apparatus. 2. Stable mild cardiomegaly and pulmonary vascular congestion. 3. Suspect left pleural effusion. Electronically Signed   By: Marin Roberts M.D.   On: 07/16/2023 09:48   DG Chest 1 View Result Date: 07/15/2023 CLINICAL DATA:  ECMO. EXAM: CHEST  1 VIEW portable COMPARISON:  07/14/2023 FINDINGS: Stable ET tube, enteric tube. Left IJ catheter tip along the SVC right atrial junction region. Right IJ Swan-Ganz catheter with tip overlying the right pulmonary artery. Separate ECMO catheter extending overlying the cardiac shadow. The heart is enlarged. There appear to be enlarged pulmonary arteries as well with vascular congestion. Persistent left retrocardiac opacity. Decreasing right lung base opacity. No pneumothorax. Overlapping cardiac leads. IMPRESSION: Numerous tubes and lines are stable. Decreasing right lung base opacity. Electronically Signed   By: Karen Kays M.D.   On: 07/15/2023 10:12   DG CHEST PORT 1 VIEW Result Date: 07/14/2023 CLINICAL DATA:  ECMO chest pain EXAM: PORTABLE CHEST 1 VIEW COMPARISON:  07/14/2023, CT 07/10/2023, chest x-ray 07/13/2023, 07/12/2023, 07/11/2023 FINDINGS: Endotracheal tube tip is about 4.6 cm superior to the carina. Enteric tube tip below the diaphragm but incompletely visualized. Right IJ Swan-Ganz catheter tip over the right pulmonary artery. Left IJ central venous catheter tip at the SVC. Left ventricular assist device with the tip overlying the left ventricular region. Cardiac enlargement as before. Partially visualized ECMO catheter tip overlying the intra hepatic IVC, about 2.7 cm caudal to the right atrial IVC junction. Persistent left lung base consolidation. Vascular congestion and hazy right base airspace disease IMPRESSION: 1. Support lines and tubes as above 2. Cardiomegaly with vascular congestion and probable pleural effusions. No significant change  in dense left lung base consolidation and hazy airspace disease at the right base. Electronically Signed   By: Jasmine Pang M.D.   On: 07/14/2023 20:19   DG C-Arm 1-60 Min-No Report Result Date: 07/14/2023 Fluoroscopy was utilized by the requesting physician.  No radiographic interpretation.   DG C-Arm 1-60 Min-No Report Result Date: 07/14/2023 Fluoroscopy was utilized by the requesting physician.  No radiographic interpretation.    Labs:  Basic Metabolic Panel: Recent Labs  Lab 08/07/23 0415 08/08/23 0438 08/09/23 0549 08/10/23 0736 08/11/23 0602 08/12/23 0538 08/13/23 0550  NA 134* 137 137 138 139 140 137  K 4.0 4.3 4.0 4.2 3.8 4.3 4.2  CL 95* 100 98 104 106 109 106  CO2 26 23 26  20* 22 17* 22  GLUCOSE 91 96 96 134* 88 81 91  BUN 36* 49* 36* 36* 34* 38* 39*  CREATININE 5.66* 6.21* 4.66* 4.27* 3.87* 3.71* 3.30*  CALCIUM 8.6* 8.5* 8.5* 8.6* 8.6* 8.7* 8.7*  MG  --   --   --   --   --   --  1.6*  PHOS 6.9* 7.2* 5.8* 5.6* 5.3* 4.9* 5.5*    CBC: Recent Labs  Lab 08/08/23 0438 08/09/23 0549 08/10/23 0736  WBC 7.9 7.2 7.3  HGB 10.5* 11.0* 10.9*  HCT 33.4* 34.6* 34.7*  MCV 94.9 93.8 94.0  PLT 291 228 220    CBG: Recent Labs  Lab 08/08/23 0611 08/08/23 1205 08/08/23 1826 08/08/23 2105 08/09/23 0600  GLUCAP 94 159* 159* 158* 93   Family history.  Positive for heart disease.  Denies any colon cancer esophageal cancer or rectal cancer  Brief HPI:   JAILON SCHAIBLE is a  38 y.o. right-handed male with history significant for PAF not adherent to Eliquis, chronic diastolic congestive heart failure, severe mitral regurgitation, hyperthyroidism.  Per chart review patient lives with his girlfriend.  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  Presented 07/09/2023 with left-sided chest pain radiating to his back and dyspnea x 4 days.  Noted increased pain with inspiration.  He did report some radiating pain down his left arm and numbness with tingling sensation.  Noted blood  pressure 138/108 and oxygen saturation is 100%.  CT angiogram of the chest shows segmental bilateral lower lobe pulmonary emboli.  No evidence for right heart strain.  Patchy groundglass opacities in the inferior left lower lobe worrisome for infarction.  New mediastinal and bilateral hilar lymphadenopathy.  Admission chemistries unremarkable except CO2 of 20 troponin 64-56, BNP 703.7, D-dimer 1.83, TSH less than 0.0100 and free T4 of 2.15.  Intravenous heparin was initiated.  Hospital course complicated by PEA arrest/A-fib with RVR and ROSC after 40 minutes status post TNK/ECMO and followed by heart failure team.  Cardiac rate remained controlled maintained on amiodarone 200 mg daily.  Latest echocardiogram showed ejection fraction of 40 to 45% left ventricle showing mildly decreased function.  Postarrest course complicated by ATN requiring CRRT transition to hemodialysis.  He was decannulated 3/8 also requiring Impella eventually removed 3/12.  He initially failed extubation x 1 due to mucous plugging but eventually was extubated 3/16.  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  Patient remained on intravenous heparin for pulmonary emboli awaiting plan to transition to Eliquis after [facility] HD catheter placed.  He did have some elevated LFTs/transaminitis likely secondary to shock liver and monitored.  Currently on a regular diet after initially having a nasogastric tube in place.  Palliative care consulted to follow for establishing goals of care.  Therapy evaluations completed due to patient decreased functional mobility was admitted for a comprehensive rehab program.   Hospital Course: Aaron [name] was admitted to rehab 08/04/2023 for inpatient therapies to consist of PT, ST and OT at least three hours five days a week. Past admission physiatrist, therapy team and rehab RN have worked together to provide customized collaborative inpatient rehab.  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  Patient continued to make progressive gains.  Maintained on intravenous heparin transition to Eliquis after [facility] HD catheter was placed.    Cardiogenic shock/acute systolic congestive heart failure A-fib with severe MR SVT PEA arrest.  ROSC x 40 minutes followed by cardiology services status post ECMO with Impella decannulated 3/8 and Impella removed and extubated 3/12 requiring short reintubation but eventually extubated 3/16.  Cardiac rate remained controlled and weaned off amiodarone.  Acute kidney injury due to ATN/hyperkalemia follow-up renal services transition from CRRT to hemodialysis monitoring of renal function.  Normocytic anemia follow-up CBC no bleeding disorder.  Transaminitis likely shock liver monitoring of LFTs.  Decreased nutritional storage diet advance as tolerated follow-up dietary services.  Hyperthyroidism continued on Tapazole 10 mg daily need to check TSH level in 4 weeks.   Blood pressures were monitored on TID basis and controlled monitored     Rehab course: During patient's stay in rehab weekly team conferences were held to monitor patient's progress, set goals and discuss barriers to discharge. At admission, patient required minimal assist 16 feet rolling walker minimal assist sit to stand  Physical exam.  Blood pressure 117/87 pulse 74 temperature 98.9 respirations  20 oxygen saturation 99% room air Constitutional.  No acute distress HEENT Head.  Normocephalic and atraumatic Eyes.  Pupils round and reactive to light no discharge without nystagmus Neck.  Supple nontender no JVD without thyromegaly Cardiac regular rate and rhythm without any extra sounds or murmur heard Abdomen.  Soft nontender positive bowel sounds without rebound Respiratory effort normal no respiratory distress without wheeze Extremities.  No clubbing cyanosis or edema Neurologic.  Cranial  nerves II through XII intact, motor strength 5/5 in bilateral deltoid bicep tricep, grip, 4/5 hip flexor, knee extensors, ankle dorsi plantarflexion. Sensory exam normal sensation to light touch  He/She  has had improvement in activity tolerance, balance, postural control as well as ability to compensate for deficits. He/She has had improvement in functional use RUE/LUE  and RLE/LLE as well as improvement in awareness.  Working with energy conservation techniques.  Patient can don and doff shorts socks and shoes independently while in bed.  Ambulates to the main gym with no assistive device independently.  Navigates hurdles of various heights with supervision.  Ambulates to the room and outside the hospital with supervision.  Completes all functional mobility during ADLs with standby assist.  SLP follow-up patient with excellent progress utilizes memory strategies independently and resolved complex problems with modified independence.  Full family teaching completed plan discharge to home.       Disposition:  There are no questions and answers to display.         Diet: Renal diet  Special Instructions: No driving smoking or alcohol    Medications at discharge. 1.  Tylenol as needed 2.  MiraLAX daily hold for loose stools 3.  Vitamin C 250 mg p.o. twice daily 4.  Thiamine 100 mg p.o. daily 5.  Folic acid 1 mg p.o. daily 6.  Eliquis 5 mg twice daily 7.  Tapazole 10 mg p.o. daily 8.  Rena-Vite 1 tablet p.o. nightly 9.  Oxycodone 5 mg every 6 hours as needed moderate pain 10.  Albuterol inhaler 2 puffs every 6 hours as needed shortness of breath 11.  Magnesium gluconate 500 mg daily   30-35 minutes were spent completing discharge summary and discharge planning     Follow-up Information     Fanny Dance, MD Follow up.   Specialty: Physical Medicine and Rehabilitation Why: No formal follow up needed Contact information: [address] [place]  [address] [phone]         Christell Constant, MD Follow up.   Specialty: Cardiology Why: Call for appointment Contact information: [address] [place] [address] [phone]         [name], MD Follow up.   Specialty: Nephrology Why: Call for appointment Contact information: [address] [place] [address] [phone]                 Signed: Mcarthur Rossetti Digby Groeneveld 08/13/2023, 11:30 AM

## 2023-08-06 DIAGNOSIS — I5020 Unspecified systolic (congestive) heart failure: Secondary | ICD-10-CM

## 2023-08-06 DIAGNOSIS — N179 Acute kidney failure, unspecified: Secondary | ICD-10-CM | POA: Diagnosis not present

## 2023-08-06 DIAGNOSIS — G4700 Insomnia, unspecified: Secondary | ICD-10-CM | POA: Diagnosis not present

## 2023-08-06 DIAGNOSIS — I480 Paroxysmal atrial fibrillation: Secondary | ICD-10-CM | POA: Diagnosis not present

## 2023-08-06 DIAGNOSIS — R110 Nausea: Secondary | ICD-10-CM | POA: Diagnosis not present

## 2023-08-06 DIAGNOSIS — R5381 Other malaise: Secondary | ICD-10-CM | POA: Diagnosis not present

## 2023-08-06 LAB — RENAL FUNCTION PANEL
Albumin: 2.6 g/dL — ABNORMAL LOW (ref 3.5–5.0)
Anion gap: 17 — ABNORMAL HIGH (ref 5–15)
BUN: 72 mg/dL — ABNORMAL HIGH (ref 6–20)
CO2: 20 mmol/L — ABNORMAL LOW (ref 22–32)
Calcium: 8.4 mg/dL — ABNORMAL LOW (ref 8.9–10.3)
Chloride: 96 mmol/L — ABNORMAL LOW (ref 98–111)
Creatinine, Ser: 8.12 mg/dL — ABNORMAL HIGH (ref 0.61–1.24)
GFR, Estimated: 8 mL/min — ABNORMAL LOW (ref >60)
Glucose, Bld: 86 mg/dL (ref 70–99)
Phosphorus: 7.7 mg/dL — ABNORMAL HIGH (ref 2.5–4.6)
Potassium: 4.6 mmol/L (ref 3.5–5.1)
Sodium: 133 mmol/L — ABNORMAL LOW (ref 135–145)

## 2023-08-06 LAB — CBC
HCT: 33.1 % — ABNORMAL LOW (ref 39.0–52.0)
Hemoglobin: 10.6 g/dL — ABNORMAL LOW (ref 13.0–17.0)
MCH: 29.6 pg (ref 26.0–34.0)
MCHC: 32 g/dL (ref 30.0–36.0)
MCV: 92.5 fL (ref 80.0–100.0)
Platelets: 364 10*3/uL (ref 150–400)
RBC: 3.58 MIL/uL — ABNORMAL LOW (ref 4.22–5.81)
RDW: 16.4 % — ABNORMAL HIGH (ref 11.5–15.5)
WBC: 6.5 10*3/uL (ref 4.0–10.5)
nRBC: 0.3 % — ABNORMAL HIGH (ref 0.0–0.2)

## 2023-08-06 LAB — GLUCOSE, CAPILLARY
Glucose-Capillary: 93 mg/dL (ref 70–99)
Glucose-Capillary: 78 mg/dL (ref 70–99)
Glucose-Capillary: 154 mg/dL — ABNORMAL HIGH (ref 70–99)

## 2023-08-06 LAB — HEPATITIS B SURFACE ANTIGEN: Hepatitis B Surface Ag: NONREACTIVE

## 2023-08-06 LAB — HEPARIN LEVEL (UNFRACTIONATED): Heparin Unfractionated: 0.62 [IU]/mL (ref 0.30–0.70)

## 2023-08-06 MED ORDER — HEPARIN SODIUM (PORCINE) 1000 UNIT/ML IJ SOLN
INTRAMUSCULAR | Status: AC
  Filled 2023-08-06: qty 3

## 2023-08-06 MED ORDER — CHLORHEXIDINE GLUCONATE CLOTH 2 % EX PADS
6.0000 | MEDICATED_PAD | Freq: Every day | CUTANEOUS | Status: DC
  Administered 2023-08-06: 6 via TOPICAL

## 2023-08-06 MED ORDER — SODIUM CHLORIDE 0.9 % IV SOLN
12.5000 mg | Freq: Four times a day (QID) | INTRAVENOUS | Status: DC | PRN
  Filled 2023-08-06: qty 0.5

## 2023-08-06 MED ORDER — PROMETHAZINE HCL 12.5 MG PO TABS: 12.5000 mg | ORAL_TABLET | Freq: Four times a day (QID) | ORAL | Status: DC | PRN

## 2023-08-06 MED ORDER — PROMETHAZINE HCL 12.5 MG PO TABS
12.5000 mg | ORAL_TABLET | Freq: Four times a day (QID) | ORAL | Status: DC | PRN
  Administered 2023-08-06: 12.5 mg via ORAL
  Filled 2023-08-06 (×2): qty 1

## 2023-08-06 NOTE — Progress Notes (Signed)
 PHARMACY - ANTICOAGULATION CONSULT NOTE  Pharmacy Consult for Heparin  Indication: pulmonary embolus, s/p ECMO decannulation 3/7, Impella removal 3/12  No Known Allergies  Patient Measurements: Height: 6\' 4"  (193 cm) Weight: 85.1 kg (187 lb 9.8 oz) IBW/kg (Calculated) : 86.8 Heparin Dosing Weight: n/a  Vital Signs: Temp: 98.2 F (36.8 C) (03/26 0528) BP: 115/70 (03/26 0528) Pulse Rate: 94 (03/26 0528)  Labs: Recent Labs    08/04/23 0425 08/05/23 0500 08/05/23 0628 08/06/23 0559  HGB 9.2* 9.5*  --  10.6*  HCT 28.2* 29.9*  --  33.1*  PLT 407* 357  --  364  HEPARINUNFRC 0.47  --  0.55 0.62  CREATININE 10.47* 7.73*  --  8.12*    Estimated Creatinine Clearance: 14.8 mL/min (A) (by C-G formula based on SCr of 8.12 mg/dL (H)).   Medical History: Past Medical History:  Diagnosis Date   Atrial fibrillation [facility])    Hyperthyroidism    Mitral regurgitation    NSTEMI (non-ST elevated myocardial infarction) [facility])     Assessment: 38 yo M with bilateral PE s/p TNK (2/27 @1156 ) now on [place] ECMO + Impella. Pharmacy consulted for bivalirudin for anticoagulation.   S/p impella CP >5.5 3/3 - bivalirudin was previously held with oozing at insertion site but restart 3/4. Patient underwent mitraclip 3/5.  Bival transitioned to heparin 3/7 after decannulation.  Impella 5.5 removed 3/13. Heparin restarted 3/13. Hep in CRRT stopped 3/15 with CRRT stopping.   3/26 AM: Heparin level 0.62, therapeutic on 1900 units/hr. No issues with infusion running or signs of bleeding noted. Hgb 10.6, pltc 364 (stable).  Deferring TC placement today pending possible renal recovery - continuing heparin, eventually transition to Eliquis pending final decision.   Goal of Therapy:  Heparin level 0.3-0.7 units/mL Monitor platelets by anticoagulation protocol: Yes   Plan:  Continue heparin infusion at 1900 units/hr Heparin level and CBC daily  Monitor s/sx of bleeding   Reece Leader, Colon Flattery,  BCCP Clinical Pharmacist  08/06/2023 1:53 PM   [facility] pharmacy phone numbers are listed on [email or web address]

## 2023-08-06 NOTE — Progress Notes (Addendum)
 Patient ID: Aaron [name], male   DOB: 02/04/1986, 38 y.o.   MRN: [id number]     Advanced Heart Failure Rounding Note  Cardiologist: Christell Constant, MD  Chief Complaint: V-A ECMO  Subjective:    Had iHD 3/24. -2375 UOP documented + 7 unmeasured occurrences yesterday.   Sitting at sink doing ADLs.   Significant events  - Admitted 2/26 with worsening SOB, CP and a fib with RVR. - Decompensated 2/27 with IVF, nodal blockade with subsequent respiratory arrest, ROSC achieved after ~16 minute down time - Femoral VA ecmo cannulation 2/27 with Impella CP vent - ECMO decannulation 3/8 - Impella removed and extubated 3/12.  Had to be reintubated with mucus plugging and left lung opacification.  - MRI head 3/13 with small infarcts right cerebral and cerebellar hemispheres - Re-extubated 3/16 - >CIR 3/24  Objective:   Echo 08/01/23 EF 40-45% mild residual MR. RV ok   Weight Range: 85.1 kg Body mass index is 22.84 kg/m.   Vital Signs:   Temp:  [98 F (36.7 C)-98.3 F (36.8 C)] 98.2 F (36.8 C) (03/26 0528) Pulse Rate:  [88-94] 94 (03/26 0528) Resp:  [18] 18 (03/26 0528) BP: (115-134)/(70-83) 115/70 (03/26 0528) SpO2:  [97 %-100 %] 97 % (03/26 0741) Weight:  [85.1 kg] 85.1 kg (03/26 0500) Last BM Date : 08/05/23  Weight change: Filed Weights   08/04/23 1625 08/05/23 0602 08/06/23 0500  Weight: 88.6 kg 86.5 kg 85.1 kg    Intake/Output:   Intake/Output Summary (Last 24 hours) at 08/06/2023 0935 Last data filed at 08/06/2023 0842 Gross per 24 hour  Intake 1033.86 ml  Output 2575 ml  Net -1541.14 ml    Physical Exam  General:  well appearing.  No respiratory difficulty Neck: supple. JVD flat.  Cor: PMI nondisplaced. Regular rate & rhythm. No rubs, gallops or murmurs. Lungs: clear Extremities: no cyanosis, clubbing, rash, edema  Neuro: alert & oriented x 3. Moves all 4 extremities w/o difficulty. Affect pleasant.  Telemetry  Not on tele  Labs  CBC Recent Labs     08/05/23 0500 08/06/23 0559  WBC 5.9 6.5  NEUTROABS 3.5  --   HGB 9.5* 10.6*  HCT 29.9* 33.1*  MCV 95.2 92.5  PLT 357 364   Basic Metabolic Panel Recent Labs    01/60/10 0500 08/06/23 0559  NA 132* 133*  K 4.5 4.6  CL 94* 96*  CO2 23 20*  GLUCOSE 92 86  BUN 65* 72*  CREATININE 7.73* 8.12*  CALCIUM 7.9* 8.4*  PHOS  --  7.7*   Liver Function Tests Recent Labs    08/05/23 0500 08/06/23 0559  AST 33  --   ALT 47*  --   ALKPHOS 211*  --   BILITOT 0.7  --   PROT 6.2*  --   ALBUMIN 2.3* 2.6*   BNP: BNP (last 3 results) Recent Labs    07/09/23 1934 07/10/23 1224  BNP 703.7* 980.5*   Thyroid Function Tests No results for input(s): "TSH", "T4TOTAL", "T3FREE", "THYROIDAB" in the last 72 hours.  Invalid input(s): "FREET3"  Medications:   Scheduled Medications:  amiodarone  200 mg Oral Daily   arformoterol  15 mcg Nebulization BID   ascorbic acid  250 mg Oral BID   aspirin  81 mg Oral Daily   budesonide (PULMICORT) nebulizer solution  0.25 mg Nebulization BID   Chlorhexidine Gluconate Cloth  6 each Topical Q12H   [START ON 08/08/2023] darbepoetin (ARANESP) injection - DIALYSIS  200 mcg Subcutaneous Q Fri-1800   feeding supplement (NEPRO CARB STEADY)  237 mL Oral Q24H   folic acid  1 mg Oral Daily   methimazole  10 mg Oral Daily   multivitamin  1 tablet Oral QHS   revefenacin  175 mcg Nebulization Daily   sodium chloride flush  10-40 mL Intracatheter Q12H   thiamine  100 mg Oral Daily   zinc sulfate (50mg  elemental zinc)  220 mg Oral Daily  Infusions:  heparin 1,900 Units/hr (08/06/23 0625)    PRN Medications: acetaminophen, Gerhardt's butt cream, lactulose, melatonin, oxyCODONE, polyethylene glycol Patient Profile   Patient with a history of Grave's disease, severe primary mitral regurgitation who presented with chest pain and segmental PE. Subsequent progressed to SCAI Stage E shock requiring VA ECMO cannulation on 2/27.   Assessment/Plan  SCAI Stage E  Cardiogenic shock - Suspect hypoxic respiratory failure driven with segmental PE on top of severe MR, nodal blockage, and thyrotoxicosis - Down time ~ 20 minutes, good mental status confirmed post code - Underwent successful mTEER on 07/16/23 with placement of x3 clips and improvement in MR from severe to mild. Mean mitral gradient of 3-26mmHg.  - Decannulated from V-A ECMO by Dr. Karin Lieu on 07/19/23 with vascular cut down and primary repair of left femoral artery.  - Echo 3/10: EF 35-40%, anterior and septal HK, normal RV, s/p Mitraclip with mild MR and mean gradient 7 mmHg.  - Impella removed 3/12.  - Echo 08/01/23 EF 40-45% mild residual MR. RV ok  - Resolved. Doing well today. GDMT limited by AKI/CKD   Severe mitral valve regurgitation - Noted on previous echocardiogram - Now s/p mTEER on 07/16/23; improvement in MR from severe to mild.  - Echo with mild residual MR  Pulmonary hypertension - Due to severe MR - Now much improved.   VT/NSVT - Initially with frequent runs of NSVT likely secondary to impella 5.5 position. Impella now out.  - no further VT. Now off mexilitene - continue amiodarone 200 mg daily for now. Stop prior to d/c   Acute renal failure/hyperkalemia - Nephrology following. Now on iHD.  - Starting to make urine. -2.3L UOP +7 unmeasured occurrences. Holding iHD for today. Plan to reevaluate tomorrow.  - TDC on hold with some renal recovery - Plan to transition to Eliquis when long term HD access in place if needed  Thyrotoxicosis - Was not taking medications as they made him feel poorly - Suspect underlying low output heart failure due to mitral valve disease - On methimazole 10 tid.   Atrial fibrillation - Present on arrival, in the setting of PE and thyroid disease - maintaining NSR  - Continue po amio for now  - heparin gtt. Eliquis after possible TDC placed  Pulmonary embolism:  - Segmental without right heart strain - Heparin gtt.  Eliquis after possible TDC  placed  CAD - Prior embolic infarct in the LAD territory with corresponding LGE on CMR - Lifelong anticoagulation - On heparin gtt   CVA - MRI head 3/13 with small infarcts right cerebral and cerebellar hemispheres. This should not significantly affect consciousness.  - Suspect embolic, on heparin gtt.   Hyperkalemia - resolved  Now in CIR.   Length of Stay: 2  Alen Bleacher, NP  08/06/2023, 9:35 AM  Advanced Heart Failure Team Pager [phone] (M-F; 7a - 5p)  Please contact [facility] for night-coverage after hours (5p -7a ) and weekends on [email or web address]  Patient seen and examined with  the above-signed Systems developer and/or Housestaff. I personally reviewed laboratory data, imaging studies and relevant notes. I independently examined the patient and formulated the important aspects of the plan. I have edited the note to reflect any of my changes or salient points. I have personally discussed the plan with the patient and/or family.  In CIR. Doing well. Working in gym. Gets tired easily. C/o tremor  General:  Well appearing. No resp difficulty HEENT: normal Neck: supple. no JVD. Carotids 2+ bilat; no bruits. No lymphadenopathy or thryomegaly appreciated. Cor: PMI nondisplaced. Regular rate & rhythm. No rubs, gallops or murmurs. Lungs: clear Abdomen: soft, nontender, nondistended. No hepatosplenomegaly. No bruits or masses. Good bowel sounds. Extremities: no cyanosis, clubbing, rash, edema Neuro: alert & orientedx3, cranial nerves grossly intact. moves all 4 extremities w/o difficulty. Affect pleasant + tremor  Doing well. HF and rhythm stable. Renal function improving but still not clearing completely.   Tremor may be related to uremia or amio.   Will stop amio for now.   Once decision made on [facility] can switch heparin to Eliquis.   GDMT limited by low BP and AKI. Ideally would get him on Bidil but do not want to drop BP if he needs HD  Arvilla Meres, MD  1:21  PM

## 2023-08-06 NOTE — Progress Notes (Signed)
 Physical Therapy Session Note  Patient Details  Name: Aaron [name] MRN: [id number] Date of Birth: 04-16-1986  Today's Date: 08/06/2023 PT Individual Time: 1045-1140 PT Individual Time Calculation (min): 55 min  and Today's Date: 08/06/2023 PT Missed Time: 20 Minutes Missed Time Reason: Patient fatigue  Short Term Goals: Week 1:  PT Short Term Goal 1 (Week 1): Pt will ambulate 150 feet with LRAD and supervision PT Short Term Goal 2 (Week 1): pt will perform outcome measure assessment PT Short Term Goal 3 (Week 1): pt will perform stand pivot transfer with LRAD and supervision  Skilled Therapeutic Interventions/Progress Updates:      Pt supine in bed upon arrival. Pt agreeable to bed level therapy today as pt endorses increased 5/10 whooziness sitting EOB and 7/10 whooziness with standing. Pt deneis any whooziness with lying down Pt reports he was able to push through it during OT session, but feels worse now. Notified nurse and PA.   Vitals assessed: Supine: 119/85 HR 86  Sitting EOB 132/93 HR 83  Pt performed the following therex while seated EOB for B LE strengthening/ROM/coordination, activity tolerance/endurance, verabl cues provided for breathing in through the nose, technique, completion within available range and compensation reduction.   1x10 LAQ B  1x10 seated alternating marching B 1x10 seated ankle pumps B 1x10 SLR B  2x10 glute bridge-holding for 3" at top 1x10 alternating dead bugs B 1x10 sidelying clamshells B 1x10 sidelying hip abduction B 1x10 sidelying hip extension B  Pt expressed concerns mainly with his arms, not really with his legs and reports questions/concerns regarding why we are working on his legs. Extensive education provided regarding pt impairments, CLOF, and overall POC. Pt verbalized understanding and agreeable.   Discussed pt water intake, pt reports he hasn't really been drinking water. Discussed pt 1500 mL fluid restriction 2/2 dialysis,  encouraged pt to drink water within that restriction to stay hydrated. Pt verbalized understanding and agreeale. Provided pt with cup of water during session, notified nurse. Verbal cues provided for chin tuck, and intermittent throat clearance, no evidence of cough during session today.   Pt missed 20 min 2/2 fatigue. Will attempt to make up missed minutes as available.   Pt supine in bed with all needs within reach and bed alarm on.      Therapy Documentation Precautions:  Precautions Precautions: Fall Precaution/Restrictions Comments: Monitor vitals; HD MWF; Painful L ankle fx from 2010, mild L hemiparesis Restrictions Weight Bearing Restrictions Per Provider Order: No  Therapy/Group: Individual Therapy  [facility], , DPT  08/06/2023, 11:00 AM

## 2023-08-06 NOTE — Progress Notes (Addendum)
 PROGRESS NOTE   Subjective/Complaints: Pt reports AM nausea/dizziness improved today. Slept better with melatonin. Reports increased urine output.   ROS: Patient denies fever, new vision changes,  vomiting, diarrhea,   or chest pain, headache, or mood change. + shortness of breath with exertion not at rest + dizziness, nausea after AM medications +chronic insomnia-improved  Objective:   DG Swallowing Func-Speech Pathology Result Date: 08/04/2023 Table formatting from the original result was not included. Modified Barium Swallow Study Patient Details Name: DAYSEAN TINKHAM MRN: [id number] Date of Birth: 1985-09-28 Today's Date: 08/04/2023 HPI/PMH: HPI: DELTA DESHMUKH is a 38 yo male presenting to ED 2/26 with chest pain and dyspnea. Admitted with bilateral lower lobe pulmonary emboli. VT/VF cardiac arrest 2/27, requiring 40 minutes of CPR and multiple shocks. Impella placed 2/27-3/12. ECMO initiated 2/27-3/8. CRRT stopped 3/15. ETT 2/27-3/12, reintubated 3/12-3/16 s/p mucus plugging and L lung opacification. 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 MBS 3/18 showed gross silent aspiration of thin and nectar thick liquids. SLP recommended pt start diet of Dys 3 solids with honey thick liquids and initiated use of EMST. PMH includes hypothyroidism, paroxysmal A-fib, severe mitral regurgitation Clinical Impression: Pt presents with moderate oropharyngeal dysphagia. While this is improved from [facility] 3/18, pt continues to have deficits related to timing and airway protection leading to penetration and aspiration of thin and nectar thick liquids. Pt's cough is intermittently more effective at clearing penetrates from the vocal folds, but he is not always able to expel penetrates completely (PAS 5). There was one instance of trace aspiration with thin liquids (PAS 8)  that was not cleared by a cued cough. A chin tuck posture was effective in preventing penetrates from reaching the vocal folds (PAS 2). He continues to have mild pharyngeal residue. Recommend continuing regular diet but ugprading to thin liquids. He may benefit from full supervision initially to ensure he uses a chin tuck posture any time he drinks liquids. Encourage intermittent hard coughing. Pt is receptive to exercises that will improve his swallowing. SLP will f/u to continue targeting compensatory strategies and EMST with consideration of using a device with a higher threshold. DIGEST Swallow Severity Rating*  Safety: 2  Efficiency: 1  Overall Pharyngeal Swallow Severity: 2 (moderate) 1: mild; 2: moderate; 3: severe; 4: profound *The Dynamic Imaging Grade of Swallowing Toxicity is standardized for the head and neck cancer population, however, demonstrates promising clinical applications across populations to standardize the clinical rating of pharyngeal swallow safety and severity. Factors that may increase risk of adverse event in presence of aspiration [name]: Factors that may increase risk of adverse event in presence of aspiration [name]: Poor general health and/or compromised immunity; Frail or deconditioned Recommendations/Plan: Swallowing Evaluation Recommendations Swallowing Evaluation Recommendations Recommendations: PO diet PO Diet Recommendation: Regular; Thin liquids (Level 0) Liquid Administration via: Cup; Straw Medication Administration: Whole meds with puree Supervision: Staff to assist with self-feeding; Full supervision/cueing for swallowing strategies Swallowing strategies  : Chin tuck; Hard cough after swallowing; Slow rate; Small bites/sips Postural changes: Position pt fully upright for meals Oral  care recommendations: Oral care QID (4x/day); Oral care before PO; Use suctioning for oral care Recommended consults: Consider ENT consultation Treatment Plan  Treatment Plan Treatment recommendations: Therapy as outlined in treatment plan below Follow-up recommendations: Acute inpatient rehab (3 hours/day) Functional status assessment: Patient has had a recent decline in their functional status and demonstrates the ability to make significant improvements in function in a reasonable and predictable amount of time. Treatment frequency: Min 2x/week Treatment duration: 2 weeks Interventions: Aspiration precaution training; Compensatory techniques; Patient/family education; Trials of upgraded texture/liquids; Diet toleration management by SLP; Respiratory muscle strength training Recommendations Recommendations for follow up therapy are one component of a multi-disciplinary discharge planning process, led by the attending physician.  Recommendations may be updated based on patient status, additional functional criteria and insurance authorization. Assessment: Orofacial Exam: Orofacial Exam Oral Cavity: Oral Hygiene: WFL Oral Cavity - Dentition: Adequate natural dentition Orofacial Anatomy: WFL Oral Motor/Sensory Function: WFL Anatomy: Anatomy: WFL Boluses Administered: Boluses Administered Boluses Administered: Thin liquids (Level 0); Mildly thick liquids (Level 2, nectar thick); Moderately thick liquids (Level 3, honey thick); Solid  Oral Impairment Domain: Oral Impairment Domain Lip Closure: No labial escape Tongue control during bolus hold: Cohesive bolus between tongue to palatal seal Bolus preparation/mastication: Timely and efficient chewing and mashing Bolus transport/lingual motion: Brisk tongue motion Oral residue: Complete oral clearance Location of oral residue : N/A Initiation of pharyngeal swallow : Posterior angle of the ramus  Pharyngeal Impairment Domain: Pharyngeal Impairment Domain Soft palate elevation: No bolus between soft palate (SP)/pharyngeal wall (PW) Laryngeal elevation: Complete superior movement of thyroid cartilage with complete approximation of  arytenoids to epiglottic petiole Anterior hyoid excursion: Complete anterior movement Epiglottic movement: Complete inversion Laryngeal vestibule closure: Complete, no air/contrast in laryngeal vestibule Pharyngeal stripping wave : Present - complete Pharyngeal contraction (A/P view only): N/A Pharyngoesophageal segment opening: Complete distension and complete duration, no obstruction of flow Tongue base retraction: No contrast between tongue base and posterior pharyngeal wall (PPW) Pharyngeal residue: Trace residue within or on pharyngeal structures Location of pharyngeal residue: Valleculae; Pyriform sinuses  Esophageal Impairment Domain: No data recorded Pill: No data recorded Penetration/Aspiration Scale Score: Penetration/Aspiration Scale Score 1.  Material does not enter airway: Moderately thick liquids (Level 3, honey thick); Solid 4.  Material enters airway, CONTACTS cords then ejected out: Mildly thick liquids (Level 2, nectar thick) 8.  Material enters airway, passes BELOW cords without attempt by patient to eject out (silent aspiration) : Thin liquids (Level 0) Compensatory Strategies: Compensatory Strategies Compensatory strategies: Yes Straw: Ineffective Ineffective Straw: Thin liquid (Level 0); Mildly thick liquid (Level 2, nectar thick) Chin tuck: Effective Effective Chin Tuck: Thin liquid (Level 0)   General Information: Caregiver present: No  Diet Prior to this Study: Regular; Moderately thick liquids (Level 3, honey thick)   Temperature : Normal   Respiratory Status: WFL   Supplemental O2: None (Room air)   History of Recent Intubation: Yes  Behavior/Cognition: Alert; Cooperative; Pleasant mood Self-Feeding Abilities: Able to self-feed Baseline vocal quality/speech: Dysphonic Volitional Cough: Able to elicit Volitional Swallow: Able to elicit Exam Limitations: No limitations Goal Planning: Prognosis for improved oropharyngeal function: Good Barriers to Reach Goals: Cognitive deficits; Time post  onset; Severity of deficits No data recorded Patient/Family Stated Goal: none stated Consulted and agree with results and recommendations: Patient Pain: Pain Assessment Pain Assessment: No/denies pain End of Session: Start Time:SLP Start Time (ACUTE ONLY): 1426 Stop Time: SLP Stop Time (ACUTE ONLY): 1442 Time Calculation:SLP Time Calculation (min) (ACUTE  ONLY): 16 min Charges: SLP Evaluations $ SLP Speech Visit: 1 Visit SLP Evaluations $MBS Swallow: 1 Procedure $Swallowing Treatment: 1 Procedure SLP visit diagnosis: SLP Visit Diagnosis: Dysphagia, oropharyngeal phase (R13.12) Past Medical History: Past Medical History: Diagnosis Date  Atrial fibrillation (HCC)   Hyperthyroidism   Mitral regurgitation   NSTEMI (non-ST elevated myocardial infarction) [facility])  Past Surgical History: Past Surgical History: Procedure Laterality Date  ECMO CANNULATION N/A 07/10/2023  Procedure: ECMO CANNULATION;  Surgeon: Dolores Patty, MD;  Location: [facility] INVASIVE CV LAB;  Service: Cardiovascular;  Laterality: N/A;  EMBOLECTOMY Left 07/18/2023  Procedure: EMBOLECTOMY Left Femoral, Popliteal and iliac arterys,  Repair Left common femoral artery.;  Surgeon: Victorino Sparrow, MD;  Location: [facility] OR;  Service: Vascular;  Laterality: Left;  INTRAOPERATIVE TRANSESOPHAGEAL ECHOCARDIOGRAM N/A 07/23/2023  Procedure: ECHOCARDIOGRAM, TRANSESOPHAGEAL, INTRAOPERATIVE;  Surgeon: Loreli Slot, MD;  Location: [facility] OR;  Service: Open Heart Surgery;  Laterality: N/A;  LEFT HEART CATH AND CORONARY ANGIOGRAPHY N/A 10/22/2021  Procedure: LEFT HEART CATH AND CORONARY ANGIOGRAPHY;  Surgeon: Yvonne Kendall, MD;  Location: [facility] INVASIVE CV LAB;  Service: Cardiovascular;  Laterality: N/A;  PLACEMENT OF IMPELLA LEFT VENTRICULAR ASSIST DEVICE Right 07/14/2023  Procedure: PLACEMENT OF RIGHT AXILLARY IMPELLA 5.5 LEFT VENTRICULAR ASSIST DEVICE;  Surgeon: Loreli Slot, MD;  Location: [facility] OR;  Service: Open Heart Surgery;  Laterality: Right;  REMOVAL OF IMPELLA  LEFT VENTRICULAR ASSIST DEVICE Right 07/14/2023  Procedure: REMOVAL OF CP RIGHT FEMORAL IMPELLA LEFT VENTRICULAR ASSIST DEVICE;  Surgeon: Loreli Slot, MD;  Location: [facility] OR;  Service: Open Heart Surgery;  Laterality: Right;  REMOVAL OF IMPELLA LEFT VENTRICULAR ASSIST DEVICE N/A 07/23/2023  Procedure: REMOVAL, CARDIAC ASSIST DEVICE, IMPELLA;  Surgeon: Loreli Slot, MD;  Location: [facility] OR;  Service: Open Heart Surgery;  Laterality: N/A;  TRANSCATHETER MITRAL EDGE TO EDGE REPAIR N/A 07/16/2023  Procedure: TRANSCATHETER MITRAL EDGE TO EDGE REPAIR;  Surgeon: Tonny Bollman, MD;  Location: [facility] INVASIVE CV LAB;  Service: Cardiovascular;  Laterality: N/A;  TRANSESOPHAGEAL ECHOCARDIOGRAM (CATH LAB) N/A 07/16/2023  Procedure: TRANSESOPHAGEAL ECHOCARDIOGRAM;  Surgeon: Tonny Bollman, MD;  Location: [facility] INVASIVE CV LAB;  Service: Cardiovascular;  Laterality: N/A;  VENTRICULAR ASSIST DEVICE INSERTION N/A 07/10/2023  Procedure: VENTRICULAR ASSIST DEVICE INSERTION;  Surgeon: Dolores Patty, MD;  Location: [facility] INVASIVE CV LAB;  Service: Cardiovascular;  Laterality: N/A; Gwynneth Aliment, M.A., CF-SLP Speech Language Pathology, Acute Rehabilitation Services Secure Chat preferred [phone] 08/04/2023, 3:43 PM  Recent Labs    08/05/23 0500 08/06/23 0559  WBC 5.9 6.5  HGB 9.5* 10.6*  HCT 29.9* 33.1*  PLT 357 364   Recent Labs    08/05/23 0500 08/06/23 0559  NA 132* 133*  K 4.5 4.6  CL 94* 96*  CO2 23 20*  GLUCOSE 92 86  BUN 65* 72*  CREATININE 7.73* 8.12*  CALCIUM 7.9* 8.4*    Intake/Output Summary (Last 24 hours) at 08/06/2023 1103 Last data filed at 08/06/2023 0842 Gross per 24 hour  Intake 729.71 ml  Output 2575 ml  Net -1845.29 ml     Pressure Injury 07/23/23 Sacrum Mid Stage 2 -  Partial thickness loss of dermis presenting as a shallow open injury with a red, pink wound bed without slough. light pink (Active)  07/23/23 1200  Location: Sacrum  Location Orientation: Mid  Staging: Stage 2  -  Partial thickness loss of dermis presenting as a shallow open injury with a red, pink wound bed without slough.  Wound Description (Comments): light pink  Present on Admission: Yes    Physical Exam: Vital Signs Blood pressure 115/70, pulse 94, temperature 98.2 F (36.8 C), resp. rate 18, height 6\' 4"  (1.93 m), weight 85.1 kg, SpO2 97%.   General: NAD, watching TV Pleasant  Heart: RRR, staples right upper chest-have been removed Lungs: Clear to auscultation, breathing unlabored, no rales or wheezes Abdomen: Positive bowel sounds, soft nontender to palpation, nondistended Extremities: No clubbing, cyanosis, or edema Skin: No evidence of breakdown, no evidence of rash, HD cath Neurologic: Cranial nerves II through XII intact, motor strength is 5/5 in bilateral deltoid, bicep, tricep, grip, 4/5 hip flexor, knee extensors, ankle dorsiflexor and plantar flexor Sensory exam normal sensation to light touch in bilateral upper and lower extremities Cerebellar exam normal finger to nose to finger Musculoskeletal: Full range of motion in all 4 extremities. No joint swelling  Neurological:     Comments: Alert and oriented x 3, follows commands    Assessment/Plan: 1. Functional deficits which require 3+ hours per day of interdisciplinary therapy in a comprehensive inpatient rehab setting. Physiatrist is providing close team supervision and 24 hour management of active medical problems listed below. Physiatrist and rehab team continue to assess barriers to discharge/monitor patient progress toward functional and medical goals  Care Tool:  Bathing    Body parts bathed by patient: Right arm, Left arm, Chest, Abdomen, Front perineal area, Buttocks, Right upper leg, Left upper leg, Face (Bed-level)   Body parts bathed by helper: Right lower leg, Left lower leg     Bathing assist Assist Level: Minimal Assistance - Patient > 75% (Bed-level)     Upper Body Dressing/Undressing Upper body  dressing   What is the patient wearing?: Pull over shirt    Upper body assist Assist Level: Set up assist (Bed-level)    Lower Body Dressing/Undressing Lower body dressing            Lower body assist Assist for lower body dressing: Moderate Assistance - Patient 50 - 74% (Bed-level)     Toileting Toileting    Toileting assist Assist for toileting: Moderate Assistance - Patient 50 - 74%     Transfers Chair/bed transfer  Transfers assist     Chair/bed transfer assist level: Minimal Assistance - Patient > 75%     Locomotion Ambulation   Ambulation assist      Assist level: Minimal Assistance - Patient > 75% Assistive device: Walker-rolling Max distance: 55   Walk 10 feet activity   Assist     Assist level: Minimal Assistance - Patient > 75% Assistive device: Walker-rolling   Walk 50 feet activity   Assist    Assist level: Minimal Assistance - Patient > 75% Assistive device: Walker-rolling    Walk 150 feet activity   Assist Walk 150 feet activity did not occur: Safety/medical concerns (fatigue)         Walk 10 feet on uneven surface  activity   Assist Walk 10 feet on uneven surfaces activity did not occur: Safety/medical concerns (fatigue)         Wheelchair     Assist Is the patient using a wheelchair?: Yes Type of Wheelchair: Manual    Wheelchair assist level: Dependent - Patient 0%      Wheelchair 50 feet with 2 turns activity    Assist        Assist Level: Dependent - Patient 0%   Wheelchair 150 feet activity     Assist      Assist Level: Dependent -  Patient 0%   Blood pressure 115/70, pulse 94, temperature 98.2 F (36.8 C), resp. rate 18, height 6\' 4"  (1.93 m), weight 85.1 kg, SpO2 97%.  Medical Problem List and Plan: 1. Functional deficits secondary to debility/pulmonary emboli located by small infarct right cerebral and cerebellar hemispheres/hypoxic brain injury             -patient may not  shower             -ELOS/Goals: 18-21d/ sup  -Continue CIR, PT OT and SLP  -Team conference today please see physician documentation under team conference tab, met with team  to discuss problems,progress, and goals. Formulized individual treatment plan based on medical history, underlying problem and comorbidities.   -Therapy reports occasional tremors   2.  Antithrombotics: -DVT/anticoagulation:  Pharmaceutical: Heparin transitioning to Eliquis after TDC HD catheter placed             -antiplatelet therapy: Aspirin 81 mg daily 3. Pain Management: Oxycodone as needed 4. Mood/Behavior/Sleep: Provide emotional support             -antipsychotic agents: N/A  -3/25 melatonin as needed ordered for insomnia  -improved with melatonin  5. Neuropsych/cognition: This patient is capable of making decisions on his own behalf. 6. Skin/Wound Care: Routine skin checks 7. Fluids/Electrolytes/Nutrition: Routine in and outs with follow-up chemistries 8.  Cardiogenic shock/acute systolic congestive heart failure/A-fib/severe MR/SVT/PEA arrest.  ROSC x 40 minutes.  Follow-up cardiology services status post ECMO with Impella, decannulated 3/8 and Impella removed and extubated 3/12-reintubated but eventually extubated again 3/16.  Currently on amiodarone 200 mg daily  -3/25 HF team, cardiology plans to stop Amio before discharge.  Appreciate assistance  3/26 wts appear stable, no signs overload   Filed Weights   08/04/23 1625 08/05/23 0602 08/06/23 0500  Weight: 88.6 kg 86.5 kg 85.1 kg    9.  Acute kidney injury due to ATN/hyperkalemia.  Follow-up renal services.  Required CRRT transition to hemodialysis  - 3/26 reviewed nephrology note, possibly HD today  10.  Normocytic anemia.  Follow-up CBC.  Continue Aranesp  -3/25 HGB stable at 9.5 11.  Transaminitis.  Likely shock liver.  Follow-up chemistries 12.  Decreased nutritional storage.  Dietary follow-up.  Follow-up MBS.  -3/25 by nutrition today 13.   Hypothyroidism.  Continue Tapazole 10 mg daily.  Need to check TSH in 4 weeks. 14.  Mild a.m. nausea  -Will spread out medications.  Amiodarone may be contributing to this.  -2/26 improved continue to monitor 15. Dysphagia -Continue SLP       LOS: 2 days A FACE TO FACE EVALUATION WAS PERFORMED  Fanny Dance 08/06/2023, 11:03 AM

## 2023-08-06 NOTE — Progress Notes (Signed)
 Patient ID: Aaron [name], male   DOB: April 23, 1986, 38 y.o.   MRN: [id number]  Met with pt to give him the team conference update regarding his goals of mod/I level and target discharge date of 4/7. He reports he feels good until he takes all of the medications and then feels bad. He will talk with MD regarding this and the tremors in his hands which is a main concern of his. MD is aware of this and will address when rounding tomorrow am. Will continue to work on discharge needs.

## 2023-08-06 NOTE — Progress Notes (Signed)
 [facility] Progress Note  Name: Aaron [name] MRN: [id number] DOB: 1985/08/16   Subjective:  Seen and examined on dialysis.  Procedure supervised.  Blood pressure 129/81 and HR 82.  Tolerating goal.  Tunn catheter in use.  He had 2.4 liters UOP over 3/25 as well as 7 unmeasured urine voids.  Discussed with the patient and with his fiance via phone the risks/benefits/indications for tunneled catheter placement.  He does continue to need HD and I let him know I do recommend a tunneled catheter.   Review of systems:    Denies shortness of breath or chest pain  Denies n/v   Intake/Output Summary (Last 24 hours) at 08/06/2023 1500 Last data filed at 08/06/2023 1249 Gross per 24 hour  Intake 849.71 ml  Output 1975 ml  Net -1125.29 ml    Vitals:  Vitals:   08/06/23 0528 08/06/23 0741 08/06/23 1435 08/06/23 1448  BP: 115/70  121/77 120/83  Pulse: 94  86 86  Resp: 18  16 18   Temp: 98.2 F (36.8 C)  98.5 F (36.9 C)   TempSrc:      SpO2: 100% 97% 100% 99%  Weight:   87.5 kg   Height:         Physical Exam:    General adult male in bed in no acute distress HEENT normocephalic atraumatic extraocular movements intact sclera anicteric Neck supple trachea midline Lungs clear to auscultation bilaterally normal work of breathing at rest  Heart S1S2 no rub Abdomen soft nontender nondistended Extremities no edema  Psych normal mood and affect Neuro - alert and oriented x 3 provides hx and follows commands Access left chest nontunneled dialysis catheter   Medications reviewed   Labs:     Latest Ref Rng & Units 08/06/2023    5:59 AM 08/05/2023    5:00 AM 08/04/2023    4:25 AM  BMP  Glucose 70 - 99 mg/dL 86  92  95   BUN 6 - 20 mg/dL 72  65  865   Creatinine 0.61 - 1.24 mg/dL 7.84  6.96  29.52   Sodium 135 - 145 mmol/L 133  132  130   Potassium 3.5 - 5.1 mmol/L 4.6  4.5  5.5   Chloride 98 - 111 mmol/L 96  94  95   CO2 22 - 32 mmol/L 20  23  22    Calcium 8.9 - 10.3  mg/dL 8.4  7.9  7.9      Assessment/Plan:   Pt is a 38 y.o. yo male  likely ATN after cardiac arrest, cardiogenic shock on ECMO ( decan 3/8) and Impella ( removed 3/12), AHRF/VDRF    # Dialysis dependent anuric AKI 2/2 ATN from cardiogenic shock on CRRT 07/11/23 - 07/26/23, Left subclavian temp HD cath placed by CCM; note interruptions because of procedures, imaging studies as well as clotting. Transitioned to [facility] on 3/17.   - Improving urine output - hopeful for recovery  - HD today.  Assess HD needs daily.  -ordered strict ins/outs - ordered renal panel daily - continue with temporary dialysis catheter for now  - Recommend tunneled catheter.  Consult IR for tunneled catheter placement and removal of nontunneled HD catheter - NPO after midnight tonight   # Cardiogenic Shock,  Impella removed; status post transcutaneous mitral valve repair; previously on ECMO. Cardiology supportive of transition to rehab.   # VT + PEA SCA, as per cardiology  # PE - on heparin per primary team  -  we have consulted IR for tunneled catheter placement for 3/27 - once his hemodialysis tunneled catheter is placed we can transition to eliquis per cardiology/medicine inpatient team previous plans  # Normocytic Anemia -  on aranesp 200 mcg weekly on Fridays.  Stable and improved  # Hyperkalemia - on HD.  Changed to renal diet   # Severe MR s/p TEER 3/5  Disposition continue inpatient monitoring (in rehab now)  Estanislado Emms, MD 08/06/2023 3:34 PM

## 2023-08-06 NOTE — Progress Notes (Addendum)
 Occupational Therapy Session Note  Patient Details  Name: Aaron [name] MRN: [id number] Date of Birth: [birth year]  Today's Date: 08/06/2023 OT Individual Time: 5784-6962 OT Individual Time Calculation (min): 60 min    Short Term Goals: Week 1:  OT Short Term Goal 1 (Week 1): Pt will perform LB ADLs with Mod A + LRAD at EOB. OT Short Term Goal 2 (Week 1): Pt will perform toilet transfer with Min A + LRAD. OT Short Term Goal 3 (Week 1): Pt will tolerate standing activities >2 mins with Min A + LRAD.  Skilled Therapeutic Interventions/Progress Updates:    1:1 Pt received in the bed and reports some nausea but can continue. Pt able to come to EOB with supervision. Pt ambulated to the bathroom with CGA pushing his IV pole. Pt able to perform toileting after BM with CGA. Pt ambulated out to the sink to perform bathing and dressing at sink level due to ongoing heparin drip and HD port. Pt requires frequent rest breaks throughout session. Pt reports 4/4 dyspnea with activity and needs to rest before next activity. Pt did don pants in standing with using sink for UE support and min A for balance. Pt did don shoes and socks today. Pt was able to peform oral care in standing with extra time and seated rest break afterwards.   Pt taken to the dayroom and performed UB activity for ongoing conditioning and endurance training in prep for increase performance with ADLs. Pt used 4 lb ball to perform chest press, over head lifts and trunk rotation with moving ball around trunk in sitting position. Pt ambulated back to his room with CGA - pt reports his left leg is slightly weaker than right. Pt left resting in the bed at end of session.   Therapy Documentation Precautions:  Precautions Precautions: Fall Precaution/Restrictions Comments: Monitor vitals; HD MWF; Painful L ankle fx from 2010, mild L hemiparesis Restrictions Weight Bearing Restrictions Per Provider Order: No   Pain: No reports of pain in  session   Therapy/Group: Individual Therapy  Roney Mans [facility] 08/06/2023, 3:08 PM

## 2023-08-06 NOTE — Progress Notes (Signed)
 Speech Language Pathology Daily Session Note  Patient Details  Name: Aaron [name] MRN: [id number] Date of Birth: January 09, 1986  Today's Date: 08/06/2023 SLP Individual Time: 0800-0900 SLP Individual Time Calculation (min): 60 min  Short Term Goals: Week 1: SLP Short Term Goal 1 (Week 1): Patient will utilize chin tuck during consumption of thin liquids given min multimodal A SLP Short Term Goal 2 (Week 1): Patient will demonstrate problem solving abilities in functional situations given min multimodal A SLP Short Term Goal 3 (Week 1): Patient will recall and utilize memory compensatory strategies given min multimodal A  Skilled Therapeutic Interventions:   Pt and his significant other greeted at bedside. Flat affect noted, but he was very pleasant/cooperative throughout tx tasks targeting cognition. SLP introduced WRAP memory strategies. He was able to independently recall these after 5 and 20 min delays. He also completed verbal money and time management tasks targeting working memory, information processing, and calculations. He completed the mildly complex time management task w/ only s cues and the money comparison task w/ minA. SLP also introduced deductive reasoning tasks. Unable to complete d/t time constraints, however, pt completed the first portion of the task w/ minA cues for attention to details and information processing. Will continue in upcoming tx session as possible. He was left in his bed with the alarm set and call light within reach. Recommend cont ST.    Pain Pain Assessment Pain Scale: 0-10 Pain Score: 0-No pain  Therapy/Group: Individual Therapy  [name] 08/06/2023, 7:47 AM

## 2023-08-06 NOTE — Progress Notes (Signed)
   08/06/23 1839  Vitals  Temp 98.1 F (36.7 C)  Pulse Rate 89  Resp (!) 25  BP 124/82  SpO2 98 %  O2 Device Room Air  Weight 87 kg  Type of Weight Post-Dialysis  Oxygen Therapy  Patient Activity (if Appropriate) In bed  Pulse Oximetry Type Continuous  Oximetry Probe Site Changed No  Post Treatment  Dialyzer Clearance Lightly streaked  Hemodialysis Intake (mL) 0 mL  Liters Processed 64.3  Fluid Removed (mL) 0.5 mL  Tolerated HD Treatment Yes   Received patient in bed to unit.  Alert and oriented.  Informed consent signed and in chart.   TX duration: 3 hours  Patient tolerated well.  Transported back to the room  Alert, without acute distress.  Hand-off given to patient's nurse.   Access used: Left chest temporary HD catheter

## 2023-08-07 ENCOUNTER — Inpatient Hospital Stay (HOSPITAL_COMMUNITY)

## 2023-08-07 DIAGNOSIS — Z992 Dependence on renal dialysis: Secondary | ICD-10-CM | POA: Diagnosis not present

## 2023-08-07 DIAGNOSIS — K5900 Constipation, unspecified: Secondary | ICD-10-CM | POA: Diagnosis not present

## 2023-08-07 DIAGNOSIS — D649 Anemia, unspecified: Secondary | ICD-10-CM | POA: Diagnosis not present

## 2023-08-07 DIAGNOSIS — G4700 Insomnia, unspecified: Secondary | ICD-10-CM | POA: Diagnosis not present

## 2023-08-07 DIAGNOSIS — N179 Acute kidney failure, unspecified: Secondary | ICD-10-CM | POA: Diagnosis not present

## 2023-08-07 DIAGNOSIS — R5381 Other malaise: Secondary | ICD-10-CM | POA: Diagnosis not present

## 2023-08-07 DIAGNOSIS — R110 Nausea: Secondary | ICD-10-CM | POA: Diagnosis not present

## 2023-08-07 LAB — RENAL FUNCTION PANEL
Albumin: 2.6 g/dL — ABNORMAL LOW (ref 3.5–5.0)
Anion gap: 13 (ref 5–15)
BUN: 36 mg/dL — ABNORMAL HIGH (ref 6–20)
CO2: 26 mmol/L (ref 22–32)
Calcium: 8.6 mg/dL — ABNORMAL LOW (ref 8.9–10.3)
Chloride: 95 mmol/L — ABNORMAL LOW (ref 98–111)
Creatinine, Ser: 5.66 mg/dL — ABNORMAL HIGH (ref 0.61–1.24)
GFR, Estimated: 12 mL/min — ABNORMAL LOW (ref >60)
Glucose, Bld: 91 mg/dL (ref 70–99)
Phosphorus: 6.9 mg/dL — ABNORMAL HIGH (ref 2.5–4.6)
Potassium: 4 mmol/L (ref 3.5–5.1)
Sodium: 134 mmol/L — ABNORMAL LOW (ref 135–145)

## 2023-08-07 LAB — HEPARIN LEVEL (UNFRACTIONATED): Heparin Unfractionated: 0.54 [IU]/mL (ref 0.30–0.70)

## 2023-08-07 LAB — GLUCOSE, CAPILLARY
Glucose-Capillary: 77 mg/dL (ref 70–99)
Glucose-Capillary: 83 mg/dL (ref 70–99)
Glucose-Capillary: 119 mg/dL — ABNORMAL HIGH (ref 70–99)

## 2023-08-07 LAB — CBC
HCT: 34.3 % — ABNORMAL LOW (ref 39.0–52.0)
Hemoglobin: 10.7 g/dL — ABNORMAL LOW (ref 13.0–17.0)
MCH: 29.6 pg (ref 26.0–34.0)
MCHC: 31.2 g/dL (ref 30.0–36.0)
MCV: 94.8 fL (ref 80.0–100.0)
Platelets: 315 10*3/uL (ref 150–400)
RBC: 3.62 MIL/uL — ABNORMAL LOW (ref 4.22–5.81)
RDW: 16 % — ABNORMAL HIGH (ref 11.5–15.5)
WBC: 5.4 10*3/uL (ref 4.0–10.5)
nRBC: 0 % (ref 0.0–0.2)

## 2023-08-07 LAB — HEPATITIS B SURFACE ANTIBODY, QUANTITATIVE: Hep B S AB Quant (Post): <3.5 m[IU]/mL — ABNORMAL LOW

## 2023-08-07 MED ORDER — RENA-VITE PO TABS
1.0000 | ORAL_TABLET | Freq: Every day | ORAL | Status: DC
  Administered 2023-08-09 – 2023-08-13 (×5): 1 via ORAL
  Filled 2023-08-07 (×5): qty 1

## 2023-08-07 MED ORDER — CHLORHEXIDINE GLUCONATE CLOTH 2 % EX PADS
6.0000 | MEDICATED_PAD | Freq: Every day | CUTANEOUS | Status: DC
Start: 2023-08-08 — End: 2023-08-14
  Administered 2023-08-08: 6 via TOPICAL

## 2023-08-07 MED ORDER — DARBEPOETIN ALFA 100 MCG/0.5ML IJ SOSY
100.0000 ug | PREFILLED_SYRINGE | INTRAMUSCULAR | Status: DC
  Administered 2023-08-08: 100 ug via SUBCUTANEOUS
  Filled 2023-08-07: qty 0.5

## 2023-08-07 MED ORDER — VITAMIN C 500 MG PO TABS
250.0000 mg | ORAL_TABLET | Freq: Two times a day (BID) | ORAL | Status: DC
  Administered 2023-08-08 – 2023-08-14 (×12): 250 mg via ORAL
  Filled 2023-08-07 (×13): qty 1

## 2023-08-07 MED ORDER — POLYETHYLENE GLYCOL 3350 17 G PO PACK
17.0000 g | PACK | Freq: Every day | ORAL | Status: DC
  Administered 2023-08-10 – 2023-08-14 (×2): 17 g via ORAL
  Filled 2023-08-07 (×8): qty 1

## 2023-08-07 NOTE — Progress Notes (Signed)
 PHARMACY - ANTICOAGULATION CONSULT NOTE  Pharmacy Consult for Heparin  Indication: pulmonary embolus, s/p ECMO decannulation 3/7, Impella removal 3/12  No Known Allergies  Patient Measurements: Height: 6\' 4"  (193 cm) Weight: 82.4 kg (181 lb 10.5 oz) IBW/kg (Calculated) : 86.8 Heparin Dosing Weight: n/a  Vital Signs: Temp: 98.2 F (36.8 C) (03/27 0611) BP: 113/79 (03/27 0611) Pulse Rate: 88 (03/27 0611)  Labs: Recent Labs    08/05/23 0500 08/05/23 0628 08/06/23 0559 08/07/23 0415  HGB 9.5*  --  10.6* 10.7*  HCT 29.9*  --  33.1* 34.3*  PLT 357  --  364 315  HEPARINUNFRC  --  0.55 0.62 0.54  CREATININE 7.73*  --  8.12* 5.66*    Estimated Creatinine Clearance: 20.6 mL/min (A) (by C-G formula based on SCr of 5.66 mg/dL (H)).   Medical History: Past Medical History:  Diagnosis Date   Atrial fibrillation [facility])    Hyperthyroidism    Mitral regurgitation    NSTEMI (non-ST elevated myocardial infarction) [facility])     Assessment: 38 yo M with bilateral PE s/p TNK (2/27 @1156 ) now on [place] ECMO + Impella. Pharmacy consulted for bivalirudin for anticoagulation.   S/p impella CP >5.5 3/3 - bivalirudin was previously held with oozing at insertion site but restart 3/4. Patient underwent mitraclip 3/5.  Bival transitioned to heparin 3/7 after decannulation.  Impella 5.5 removed 3/13. Heparin restarted 3/13. Hep in CRRT stopped 3/15 with CRRT stopping.   3/26 AM: Heparin level 0.54, therapeutic on 1900 units/hr. No issues with infusion running or signs of bleeding noted. Hgb 10.7, pltc 315 (stable).  Deferring TC placement today pending possible renal recovery - continuing heparin, eventually transition to Eliquis pending final decision.   Goal of Therapy:  Heparin level 0.3-0.7 units/mL Monitor platelets by anticoagulation protocol: Yes   Plan:  Continue heparin infusion at 1900 units/hr Heparin level and CBC daily  Monitor s/sx of bleeding  Can we start Eliquis after [facility]  today?  Reece Leader, Colon Flattery, BCCP Clinical Pharmacist  08/07/2023 2:19 PM   [facility] pharmacy phone numbers are listed on [email or web address]

## 2023-08-07 NOTE — Progress Notes (Signed)
 PROGRESS NOTE   Subjective/Complaints: Patient is tired because he had late night dialysis and had nausea and vomiting after this procedure. Later in AM nursing reports he was feeling better, playing his video game.  NPO for tunneled cath today.   ROS: Patient denies fever, new vision changes,  diarrhea,   or chest pain, headache, or mood change. + shortness of breath with exertion not at rest + Nausea+ vomiting after dailysis +chronic insomnia-didn't sleep well after dialysis as above  Objective:   No results found.  Recent Labs    08/06/23 0559 08/07/23 0415  WBC 6.5 5.4  HGB 10.6* 10.7*  HCT 33.1* 34.3*  PLT 364 315   Recent Labs    08/06/23 0559 08/07/23 0415  NA 133* 134*  K 4.6 4.0  CL 96* 95*  CO2 20* 26  GLUCOSE 86 91  BUN 72* 36*  CREATININE 8.12* 5.66*  CALCIUM 8.4* 8.6*    Intake/Output Summary (Last 24 hours) at 08/07/2023 0940 Last data filed at 08/07/2023 0800 Gross per 24 hour  Intake 640.17 ml  Output 400.5 ml  Net 239.67 ml     Pressure Injury 07/23/23 Sacrum Mid Stage 2 -  Partial thickness loss of dermis presenting as a shallow open injury with a red, pink wound bed without slough. light pink (Active)  07/23/23 1200  Location: Sacrum  Location Orientation: Mid  Staging: Stage 2 -  Partial thickness loss of dermis presenting as a shallow open injury with a red, pink wound bed without slough.  Wound Description (Comments): light pink  Present on Admission: Yes    Physical Exam: Vital Signs Blood pressure 113/79, pulse 88, temperature 98.2 F (36.8 C), resp. rate 20, height 6\' 4"  (1.93 m), weight 82.4 kg, SpO2 100%.   General: NAD,  fatigued appearing Pleasant  Heart: RRR Lungs: Clear to auscultation, breathing unlabored, no rales or wheezes Abdomen: Positive bowel sounds, soft nontender to palpation, nondistended Extremities: No clubbing, cyanosis, or edema Skin: No evidence of  breakdown, no evidence of rash, HD cath Neurologic: Cranial nerves II through XII intact, motor strength is 5/5 in bilateral deltoid, bicep, tricep, grip, 4/5 hip flexor, knee extensors, ankle dorsiflexor and plantar flexor Sensory exam normal sensation to light touch in bilateral upper and lower extremities Cerebellar exam normal finger to nose to finger Musculoskeletal: Full range of motion in all 4 extremities. No joint swelling  Neurological:     Comments: Alert and oriented x 3, follows commands    Assessment/Plan: 1. Functional deficits which require 3+ hours per day of interdisciplinary therapy in a comprehensive inpatient rehab setting. Physiatrist is providing close team supervision and 24 hour management of active medical problems listed below. Physiatrist and rehab team continue to assess barriers to discharge/monitor patient progress toward functional and medical goals  Care Tool:  Bathing    Body parts bathed by patient: Right arm, Left arm, Chest, Abdomen, Front perineal area, Buttocks, Right upper leg, Left upper leg, Face   Body parts bathed by helper: Right lower leg, Left lower leg Body parts n/a: Right lower leg, Left lower leg   Bathing assist Assist Level: Contact Guard/Touching assist  Upper Body Dressing/Undressing Upper body dressing   What is the patient wearing?: Pull over shirt    Upper body assist Assist Level: Set up assist    Lower Body Dressing/Undressing Lower body dressing      What is the patient wearing?: Underwear/pull up, Pants     Lower body assist Assist for lower body dressing: Contact Guard/Touching assist     Toileting Toileting    Toileting assist Assist for toileting: Contact Guard/Touching assist     Transfers Chair/bed transfer  Transfers assist     Chair/bed transfer assist level: Contact Guard/Touching assist     Locomotion Ambulation   Ambulation assist      Assist level: Minimal Assistance - Patient  > 75% Assistive device: Walker-rolling Max distance: 55   Walk 10 feet activity   Assist     Assist level: Minimal Assistance - Patient > 75% Assistive device: Walker-rolling   Walk 50 feet activity   Assist    Assist level: Minimal Assistance - Patient > 75% Assistive device: Walker-rolling    Walk 150 feet activity   Assist Walk 150 feet activity did not occur: Safety/medical concerns (fatigue)         Walk 10 feet on uneven surface  activity   Assist Walk 10 feet on uneven surfaces activity did not occur: Safety/medical concerns (fatigue)         Wheelchair     Assist Is the patient using a wheelchair?: Yes Type of Wheelchair: Manual    Wheelchair assist level: Dependent - Patient 0%      Wheelchair 50 feet with 2 turns activity    Assist        Assist Level: Dependent - Patient 0%   Wheelchair 150 feet activity     Assist      Assist Level: Dependent - Patient 0%   Blood pressure 113/79, pulse 88, temperature 98.2 F (36.8 C), resp. rate 20, height 6\' 4"  (1.93 m), weight 82.4 kg, SpO2 100%.  Medical Problem List and Plan: 1. Functional deficits secondary to debility/pulmonary emboli located by small infarct right cerebral and cerebellar hemispheres/hypoxic brain injury             -patient may not shower             -ELOS/Goals: 08/18/23  -Continue CIR, PT OT and SLP  -Therapy reports occasional tremors   -Consider 15/7, fatigued after HD  -Pt asked for night time medication time to be adjusted to earlier in the day- completed  2.  Antithrombotics: -DVT/anticoagulation:  Pharmaceutical: Heparin transitioning to Eliquis after TDC HD catheter placed             -antiplatelet therapy: Aspirin 81 mg daily 3. Pain Management: Oxycodone as needed 4. Mood/Behavior/Sleep: Provide emotional support             -antipsychotic agents: N/A  -3/25 melatonin as needed ordered for insomnia  -improved with melatonin  5.  Neuropsych/cognition: This patient is capable of making decisions on his own behalf. 6. Skin/Wound Care: Routine skin checks 7. Fluids/Electrolytes/Nutrition: Routine in and outs with follow-up chemistries 8.  Cardiogenic shock/acute systolic congestive heart failure/A-fib/severe MR/SVT/PEA arrest.  ROSC x 40 minutes.  Follow-up cardiology services status post ECMO with Impella, decannulated 3/8 and Impella removed and extubated 3/12-reintubated but eventually extubated again 3/16.  Currently on amiodarone 200 mg daily  -3/25 HF team, cardiology plans to stop Amio before discharge.  Appreciate assistance  3/27 wt a little lower after dialysis  yesterday   Filed Weights   08/06/23 1435 08/06/23 1839 08/07/23 0500  Weight: 87.5 kg 87 kg 82.4 kg    9.  Acute kidney injury due to ATN/hyperkalemia.  Follow-up renal services.  Required CRRT transition to hemodialysis  - 3/27 figured after HD yesterday, nephrology ordering tunneled cath, he is NPO for his 10.  Normocytic anemia.  Follow-up CBC.  Continue Aranesp  -3/27 HGB stable at 10.7 11.  Transaminitis.  Likely shock liver.  Follow-up chemistries 12.  Decreased nutritional storage.  Dietary follow-up.  Follow-up MBS.  -3/25 by nutrition today 13.  Hypothyroidism.  Continue Tapazole 10 mg daily.  Need to check TSH in 4 weeks. 14.  Nausea, likely dialysis associated  -Will spread out medications.  Amiodarone may be contributing to this.  -2/27 will check abd xray 15. Dysphagia -Continue SLP   16.  Constipation  -3/27 spent a lot of time on the commode trying to have a bowel movement, check abdomen x-ray and schedule MiraLAX     LOS: 3 days A FACE TO FACE EVALUATION WAS PERFORMED  Fanny Dance 08/07/2023, 9:40 AM

## 2023-08-07 NOTE — Plan of Care (Signed)
  Problem: Consults Goal: RH STROKE PATIENT EDUCATION Description: See Patient Education module for education specifics  Outcome: Progressing   Problem: RH BOWEL ELIMINATION Goal: RH STG MANAGE BOWEL WITH ASSISTANCE Description: STG Manage Bowel with  supervision Assistance. Outcome: Progressing   Problem: RH BLADDER ELIMINATION Goal: RH STG MANAGE BLADDER WITH ASSISTANCE Description: STG Manage Bladder With  supervision Assistance Outcome: Progressing   Problem: RH SKIN INTEGRITY Goal: RH STG SKIN FREE OF INFECTION/BREAKDOWN Outcome: Progressing   Problem: RH SAFETY Goal: RH STG ADHERE TO SAFETY PRECAUTIONS W/ASSISTANCE/DEVICE Description: STG Adhere to Safety Precautions With  supervision Assistance/Device. Outcome: Progressing   Problem: RH COGNITION-NURSING Goal: RH STG USES MEMORY AIDS/STRATEGIES W/ASSIST TO PROBLEM SOLVE Description: STG Uses Memory Aids/Strategies With  supervision Assistance to Problem Solve. Outcome: Progressing   Problem: RH PAIN MANAGEMENT Goal: RH STG PAIN MANAGED AT OR BELOW PT'S PAIN GOAL Description: <4 w/ prns Outcome: Progressing   Problem: RH KNOWLEDGE DEFICIT Goal: RH STG INCREASE KNOWLEDGE OF STROKE PROPHYLAXIS Description: Manage increases  knowledge of stroke prophylaxis  with supervision assistance using educational materials provided Outcome: Progressing   Problem: Education: Goal: Knowledge of General Education information will improve Description: Including pain rating scale, medication(s)/side effects and non-pharmacologic comfort measures Outcome: Progressing   Problem: Health Behavior/Discharge Planning: Goal: Ability to manage health-related needs will improve Outcome: Progressing   Problem: Clinical Measurements: Goal: Ability to maintain clinical measurements within normal limits will improve Outcome: Progressing Goal: Will remain free from infection Outcome: Progressing Goal: Diagnostic test results will  improve Outcome: Progressing Goal: Respiratory complications will improve Outcome: Progressing Goal: Cardiovascular complication will be avoided Outcome: Progressing   Problem: Activity: Goal: Risk for activity intolerance will decrease Outcome: Progressing   Problem: Nutrition: Goal: Adequate nutrition will be maintained Outcome: Progressing   Problem: Coping: Goal: Level of anxiety will decrease Outcome: Progressing   Problem: Elimination: Goal: Will not experience complications related to bowel motility Outcome: Progressing Goal: Will not experience complications related to urinary retention Outcome: Progressing   Problem: Pain Managment: Goal: General experience of comfort will improve and/or be controlled Outcome: Progressing   Problem: Safety: Goal: Ability to remain free from injury will improve Outcome: Progressing   Problem: Skin Integrity: Goal: Risk for impaired skin integrity will decrease Outcome: Progressing

## 2023-08-07 NOTE — Progress Notes (Signed)
 [facility] Progress Note  Name: Aaron [name] MRN: [id number] DOB: 1986-02-14   Subjective:  He had 600 mL UOP over 3/26 as well as one unmeasured urine void.  Has already had 880 mL UOP charted thus far today.  Last HD on 3/26 with 0.5 kg UF.  Spoke with his fiance and sister at bedside.  His tunneled catheter was pushed to 3/28.  He has been seen by IR.   Review of systems:    Denies shortness of breath or chest pain  Nausea after last HD    Intake/Output Summary (Last 24 hours) at 08/07/2023 1737 Last data filed at 08/07/2023 1600 Gross per 24 hour  Intake 400.17 ml  Output 1280.5 ml  Net -880.33 ml    Vitals:  Vitals:   08/06/23 2138 08/07/23 0500 08/07/23 0611 08/07/23 1544  BP: 125/83  113/79 121/74  Pulse: 72  88 (!) 104  Resp: (!) 22  20 17   Temp: 98.2 F (36.8 C)  98.2 F (36.8 C) 98.7 F (37.1 C)  TempSrc:      SpO2: 100%  100% 100%  Weight:  82.4 kg    Height:         Physical Exam:    General adult male in bed in no acute distress HEENT normocephalic atraumatic extraocular movements intact sclera anicteric Neck supple trachea midline Lungs clear to auscultation bilaterally normal work of breathing at rest  Heart S1S2 no rub Abdomen soft nontender nondistended Extremities no edema  Psych normal mood and affect Neuro - alert and oriented x 3 provides hx and follows commands Access left chest nontunneled dialysis catheter   Medications reviewed   Labs:     Latest Ref Rng & Units 08/07/2023    4:15 AM 08/06/2023    5:59 AM 08/05/2023    5:00 AM  BMP  Glucose 70 - 99 mg/dL 91  86  92   BUN 6 - 20 mg/dL 36  72  65   Creatinine 0.61 - 1.24 mg/dL 4.09  8.11  9.14   Sodium 135 - 145 mmol/L 134  133  132   Potassium 3.5 - 5.1 mmol/L 4.0  4.6  4.5   Chloride 98 - 111 mmol/L 95  96  94   CO2 22 - 32 mmol/L 26  20  23    Calcium 8.9 - 10.3 mg/dL 8.6  8.4  7.9      Assessment/Plan:   Pt is a 38 y.o. yo male  likely ATN after cardiac  arrest, cardiogenic shock on ECMO ( decan 3/8) and Impella ( removed 3/12), AHRF/VDRF    # Dialysis dependent anuric AKI 2/2 ATN from cardiogenic shock on CRRT 07/11/23 - 07/26/23, Left subclavian temp HD cath placed by CCM; note interruptions because of procedures, imaging studies as well as clotting. Transitioned to [facility] on 3/17.   - Improving urine output - hopeful for recovery  - Plan for HD on 3/28.  Assess HD needs daily.  -ordered strict ins/outs - ordered renal panel daily - continue with temporary dialysis catheter for now  - Recommend tunneled catheter.  Consulted IR for tunneled catheter placement and removal of nontunneled HD catheter - appears is set for 3/28 - confirmed NPO after midnight order is in place  # Cardiogenic Shock,  Impella removed; status post transcutaneous mitral valve repair; previously on ECMO. Cardiology supportive of transition to rehab.   # VT + PEA SCA, as per cardiology  # PE - on heparin  per primary team  - we have consulted IR for tunneled catheter placement  - once his hemodialysis tunneled catheter is placed we can transition to eliquis per cardiology/medicine inpatient team previous plans  # Normocytic Anemia -  on aranesp - reduce dose to 100 mcg every Friday   # Hyperkalemia - on HD.  Changed to renal diet   # Severe MR s/p TEER 3/5  Disposition continue inpatient monitoring (in rehab now)  Estanislado Emms, MD 08/07/2023 5:54 PM

## 2023-08-07 NOTE — Progress Notes (Signed)
 Occupational Therapy Session Note  Patient Details  Name: KOJO LIBY MRN: [id number] Date of Birth: [birth year]  Today's Date: 08/07/2023 OT Individual Time:  -       Short Term Goals: Week 1:  OT Short Term Goal 1 (Week 1): Pt will perform LB ADLs with Mod A + LRAD at EOB. OT Short Term Goal 2 (Week 1): Pt will perform toilet transfer with Min A + LRAD. OT Short Term Goal 3 (Week 1): Pt will tolerate standing activities >2 mins with Min A + LRAD. Week 2:     Skilled Therapeutic Interventions/Progress Updates:    1:1 Pt missed 60 due to being off the unit for xray and then when returned pt declined out of bed activity due to fatigue from HD.  Therapy Documentation Precautions:  Precautions Precautions: Fall Precaution/Restrictions Comments: Monitor vitals; HD MWF; Painful L ankle fx from 2010, mild L hemiparesis Restrictions Weight Bearing Restrictions Per Provider Order: No General: General OT Amount of Missed Time: 60 Minutes    Pain: Pain Assessment Pain Scale: 0-10 Pain Score: 0-No pain   Therapy/Group: Individual Therapy  Roney Mans [facility] 08/07/2023, 3:27 PM

## 2023-08-07 NOTE — Progress Notes (Signed)
 Occupational Therapy Session Note  Patient Details  Name: Aaron [name] MRN: [id number] Date of Birth: 1985/11/08  {CHL IP REHAB OT TIME CALCULATIONS:[id number]}   Short Term Goals: Week 1:  OT Short Term Goal 1 (Week 1): Pt will perform LB ADLs with Mod A + LRAD at EOB. OT Short Term Goal 2 (Week 1): Pt will perform toilet transfer with Min A + LRAD. OT Short Term Goal 3 (Week 1): Pt will tolerate standing activities >2 mins with Min A + LRAD.  Skilled Therapeutic Interventions/Progress Updates:    Patient agreeable to participate in OT session. Reports *** pain level.   Patient participated in skilled OT session focusing on ***. Therapist facilitated/assessed/developed/educated/integrated/elicited *** in order to improve/facilitate/promote    Therapy Documentation Precautions:  Precautions Precautions: Fall Precaution/Restrictions Comments: Monitor vitals; HD MWF; Painful L ankle fx from 2010, mild L hemiparesis Restrictions Weight Bearing Restrictions Per Provider Order: No  Therapy/Group: Individual Therapy  Limmie Patricia, OTR/L,CBIS  Supplemental OT - [facility] and [facility] Secure Chat Preferred   08/07/2023, 10:06 PM

## 2023-08-07 NOTE — Plan of Care (Signed)
  Problem: Consults Goal: RH STROKE PATIENT EDUCATION Description: See Patient Education module for education specifics  Outcome: Progressing   Problem: RH BOWEL ELIMINATION Goal: RH STG MANAGE BOWEL WITH ASSISTANCE Description: STG Manage Bowel with  supervision Assistance. Outcome: Progressing   Problem: RH BLADDER ELIMINATION Goal: RH STG MANAGE BLADDER WITH ASSISTANCE Description: STG Manage Bladder With  supervision Assistance Outcome: Progressing   Problem: RH SKIN INTEGRITY Goal: RH STG SKIN FREE OF INFECTION/BREAKDOWN Outcome: Progressing   Problem: RH SAFETY Goal: RH STG ADHERE TO SAFETY PRECAUTIONS W/ASSISTANCE/DEVICE Description: STG Adhere to Safety Precautions With  supervision Assistance/Device. Outcome: Progressing   Problem: RH COGNITION-NURSING Goal: RH STG USES MEMORY AIDS/STRATEGIES W/ASSIST TO PROBLEM SOLVE Description: STG Uses Memory Aids/Strategies With  supervision Assistance to Problem Solve. Outcome: Progressing   Problem: RH PAIN MANAGEMENT Goal: RH STG PAIN MANAGED AT OR BELOW PT'S PAIN GOAL Description: <4 w/ prns Outcome: Progressing   Problem: RH KNOWLEDGE DEFICIT Goal: RH STG INCREASE KNOWLEDGE OF STROKE PROPHYLAXIS Description: Manage increases  knowledge of stroke prophylaxis  with supervision assistance using educational materials provided Outcome: Progressing   Problem: Education: Goal: Knowledge of General Education information will improve Description: Including pain rating scale, medication(s)/side effects and non-pharmacologic comfort measures Outcome: Progressing   Problem: Health Behavior/Discharge Planning: Goal: Ability to manage health-related needs will improve Outcome: Progressing   Problem: Clinical Measurements: Goal: Ability to maintain clinical measurements within normal limits will improve Outcome: Progressing Goal: Will remain free from infection Outcome: Progressing Goal: Diagnostic test results will  improve Outcome: Progressing Goal: Respiratory complications will improve Outcome: Progressing Goal: Cardiovascular complication will be avoided Outcome: Progressing

## 2023-08-07 NOTE — Progress Notes (Signed)
 Patient on bed pan for >45 mins, refuses to get off. Nurse and NT educated patient on potential of developing pressure injuries, patient still refuses to get off.

## 2023-08-07 NOTE — Progress Notes (Addendum)
 Speech Language Pathology Daily Session Note  Patient Details  Name: Aaron [name] MRN: [id number] Date of Birth: 1985-08-09  Today's Date: 08/07/2023 SLP Individual Time: 0830-0900 SLP Individual Time Calculation (min): 30 min  Short Term Goals: Week 1: SLP Short Term Goal 1 (Week 1): Patient will utilize chin tuck during consumption of thin liquids given min multimodal A SLP Short Term Goal 2 (Week 1): Patient will demonstrate problem solving abilities in functional situations given min multimodal A SLP Short Term Goal 3 (Week 1): Patient will recall and utilize memory compensatory strategies given min multimodal A  Skilled Therapeutic Interventions: Upon entrance, patient completing breathing treatment. Patient reports being up all night with nausea, and not feeling well this morning. SLP noted patient could stay in bed for tx, and patient agreeable to participate after breathing treatment was completed. Upon completion of breathing treatment, patient notes being on bedpan for need to have a bowel movement and for SLP to return in 20 minutes. SLP returned after 15 minutes and educated patient on risk of pressure wounds if staying on bedpan for too long. Patient verbalized understanding. SLP targeted cognitive skills for the remainder of the session. Patient required supervision A to identify and correct medication mistakes in BID pillbox. SLP educated patient on importance of utilizing BID pillbox to organize medications and aid in memory upon d/c. At the end of the session, MD present for assessment and SLP created written aid to aid patient in recall of care team names. At the end of the session, patient left in bed with alarm set and call bell in reach. Continue POC.  Initial 30 minutes of session missed due to completion of breathing treatment and toileting. SLP to return as schedule allows   Pain Tired, nausea, nursing/MD aware  Therapy/Group: Individual Therapy  Aaron [name] M.A.,  CF-SLP 08/07/2023, 7:37 AM

## 2023-08-07 NOTE — IPOC Note (Signed)
 Overall Plan of Care [facility]) Patient Details Name: Aaron [name] MRN: [id number] DOB: Aug 08, 1985  Admitting Diagnosis: Debility  Hospital Problems: Principal Problem:   Debility Active Problems:   Normocytic anemia   Nausea   Insomnia   Atrial fibrillation (HCC)   Malnutrition of moderate degree     Functional Problem List: Nursing Behavior, Bladder, Bowel, Edema, Endurance, Medication Management, Pain, Perception, Safety, Sensory, Skin Integrity  PT Balance, Endurance, Motor, Safety, Sensory  OT Balance, Edema, Endurance, Pain, Safety, Skin Integrity  SLP Cognition, Nutrition  TR         Basic ADL's: OT Eating, Bathing, Grooming, Dressing, Toileting     Advanced  ADL's: OT Simple Meal Preparation, Light Housekeeping     Transfers: PT Bed Mobility, Bed to Chair, Customer service manager, Tub/Shower     Locomotion: PT Ambulation, Wheelchair Mobility     Additional Impairments: OT Fuctional Use of Upper Extremity  SLP Swallowing, Social Cognition   Problem Solving, Memory  TR      Anticipated Outcomes Item Anticipated Outcome  Self Feeding Mod I  Swallowing  mod i   Basic self-care  Mod I  Toileting  Mod I   Bathroom Transfers Mod I  Bowel/Bladder  manage bowels with medications/ manage bladder with time toileting  Transfers  mod I  Locomotion  mod I  Communication     Cognition  mod i  Pain  <4 w/ prns  Safety/Judgment  manage safety with supervision   Therapy Plan: PT Intensity: Minimum of 1-2 x/day ,45 to 90 minutes PT Frequency: 5 out of 7 days PT Duration Estimated Length of Stay: 12-14 days OT Intensity: Minimum of 1-2 x/day, 45 to 90 minutes OT Frequency: 5 out of 7 days OT Duration/Estimated Length of Stay: 12-14 days SLP Intensity: Minumum of 1-2 x/day, 30 to 90 minutes SLP Frequency: 3 to 5 out of 7 days SLP Duration/Estimated Length of Stay: 12-14 days   Team Interventions: Nursing Interventions Patient/Family Education, Bladder  Management, Bowel Management, Disease Management/Prevention, Pain Management, Discharge Planning, Skin Care/Wound Management, Medication Management, Dysphagia/Aspiration Precaution Training  PT interventions Community reintegration, Ambulation/gait training, DME/adaptive equipment instruction, Neuromuscular re-education, Psychosocial support, Stair training, UE/LE Strength taining/ROM, Wheelchair propulsion/positioning, Warden/ranger, Discharge planning, Functional electrical stimulation, Skin care/wound management, Pain management, Therapeutic Activities, UE/LE Coordination activities, Cognitive remediation/compensation, Disease management/prevention, Functional mobility training, Patient/family education, Splinting/orthotics, Therapeutic Exercise, Visual/perceptual remediation/compensation  OT Interventions Warden/ranger, Community reintegration, Discharge planning, Disease mangement/prevention, Fish farm manager, Functional electrical stimulation, Patient/family education, Neuromuscular re-education, Functional mobility training, Pain management, Psychosocial support, Self Care/advanced ADL retraining, Skin care/wound managment, Therapeutic Activities, Therapeutic Exercise, UE/LE Strength taining/ROM, UE/LE Coordination activities  SLP Interventions Cognitive remediation/compensation, Dysphagia/aspiration precaution training, Internal/external aids, Cueing hierarchy, Therapeutic Activities, Functional tasks, Patient/family education  TR Interventions    SW/CM Interventions Discharge Planning, Psychosocial Support, Patient/Family Education   Barriers to Discharge MD  Medical stability and Hemodialysis  Nursing Decreased caregiver support, Home environment access/layout, Hemodialysis, Nutrition means Discharge: Apartment  Discharge Home Layout: One level  Discharge Home Access: Level entry  PT Hemodialysis, Decreased caregiver support, Home environment  access/layout, Lack of/limited family support dizziness, endurance  OT Wound Care, Lack of/limited family support, Hemodialysis    SLP      SW Insurance for SNF coverage     Team Discharge Planning: Destination: PT-Home ,OT- Home , SLP-Home Projected Follow-up: PT-Outpatient PT, Home health PT, OT-  Home health OT, SLP-None Projected Equipment Needs: PT-To be determined, OT- To be determined,  SLP-None recommended by SLP Equipment Details: PT- , OT-  Patient/family involved in discharge planning: PT- Patient,  OT-Patient, SLP-Patient  MD ELOS: 12-14 Medical Rehab Prognosis:  Excellent Assessment: The patient has been admitted for CIR therapies with the diagnosis of debility/pulmonary emboli 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 The team will be addressing functional mobility, strength, stamina, balance, safety, adaptive techniques and equipment, self-care, bowel and bladder mgt, patient and caregiver education. Goals have been set at Mod I. Anticipated discharge destination is home.        See Team Conference Notes for weekly updates to the plan of care

## 2023-08-07 NOTE — Consult Note (Signed)
 Chief Complaint: Patient was seen in consultation today for acute kidney injury   at the request of Vallery Sa, MD  Referring Physician(s): Vallery Sa, MD  Supervising Physician: Marliss Coots  Patient Status: [facility] - In-pt  Full Code  History of Present Illness: Aaron [name] is a 38 y.o. male with history of a fib, heart failure,, hypothyroidism, mitral regurgitation, NSTEMI and AKI. Patient was admitted to the hospital 2/26 due to chest pain and shortness of breath. Workup revealed pulmonary emboli-on heparin. Hospital stay complicated by cardiac arrest x 2, ECMO requirement, s/p impella placement. Nephrology consulted due to refractory hyperkalemia and AKI. Nephrology recommends ongoing dialysis. IR consulted to assist with tunneled HD catheter placement and removal of non tunneled HD catheter.   Patient is sitting in bed and playing video games. He reports that he is aware he requires a new catheter. Patient reports having some nausea and vomiting this morning, which subsided. Patient endorses: shortness of breath. Patient denies new: chest pain, abdominal pain, fever, vomiting.   Past Medical History:  Diagnosis Date   Atrial fibrillation [facility])    Hyperthyroidism    Mitral regurgitation    NSTEMI (non-ST elevated myocardial infarction) [facility])     Past Surgical History:  Procedure Laterality Date   ECMO CANNULATION N/A 07/10/2023   Procedure: ECMO CANNULATION;  Surgeon: Dolores Patty, MD;  Location: [facility] INVASIVE CV LAB;  Service: Cardiovascular;  Laterality: N/A;   EMBOLECTOMY Left 07/18/2023   Procedure: EMBOLECTOMY Left Femoral, Popliteal and iliac arterys,  Repair Left common femoral artery.;  Surgeon: Victorino Sparrow, MD;  Location: [facility] OR;  Service: Vascular;  Laterality: Left;   INTRAOPERATIVE TRANSESOPHAGEAL ECHOCARDIOGRAM N/A 07/23/2023   Procedure: ECHOCARDIOGRAM, TRANSESOPHAGEAL, INTRAOPERATIVE;  Surgeon: Loreli Slot, MD;  Location: [facility] OR;  Service:  Open Heart Surgery;  Laterality: N/A;   LEFT HEART CATH AND CORONARY ANGIOGRAPHY N/A 10/22/2021   Procedure: LEFT HEART CATH AND CORONARY ANGIOGRAPHY;  Surgeon: Yvonne Kendall, MD;  Location: [facility] INVASIVE CV LAB;  Service: Cardiovascular;  Laterality: N/A;   PLACEMENT OF IMPELLA LEFT VENTRICULAR ASSIST DEVICE Right 07/14/2023   Procedure: PLACEMENT OF RIGHT AXILLARY IMPELLA 5.5 LEFT VENTRICULAR ASSIST DEVICE;  Surgeon: Loreli Slot, MD;  Location: [facility] OR;  Service: Open Heart Surgery;  Laterality: Right;   REMOVAL OF IMPELLA LEFT VENTRICULAR ASSIST DEVICE Right 07/14/2023   Procedure: REMOVAL OF CP RIGHT FEMORAL IMPELLA LEFT VENTRICULAR ASSIST DEVICE;  Surgeon: Loreli Slot, MD;  Location: [facility] OR;  Service: Open Heart Surgery;  Laterality: Right;   REMOVAL OF IMPELLA LEFT VENTRICULAR ASSIST DEVICE N/A 07/23/2023   Procedure: REMOVAL, CARDIAC ASSIST DEVICE, IMPELLA;  Surgeon: Loreli Slot, MD;  [facility] OR;  Service: Open Heart Surgery;  Laterality: N/A;   TRANSCATHETER MITRAL EDGE TO EDGE REPAIR N/A 07/16/2023   Procedure: TRANSCATHETER MITRAL EDGE TO EDGE REPAIR;  Surgeon: Tonny Bollman, MD;  Location: [facility] INVASIVE CV LAB;  Service: Cardiovascular;  Laterality: N/A;   TRANSESOPHAGEAL ECHOCARDIOGRAM (CATH LAB) N/A 07/16/2023   Procedure: TRANSESOPHAGEAL ECHOCARDIOGRAM;  Surgeon: Tonny Bollman, MD;  Location: [facility] INVASIVE CV LAB;  Service: Cardiovascular;  Laterality: N/A;   VENTRICULAR ASSIST DEVICE INSERTION N/A 07/10/2023   Procedure: VENTRICULAR ASSIST DEVICE INSERTION;  Surgeon: Dolores Patty, MD;  [facility]: [facility] INVAS[facility] CV LAB;  Service: Cardiovascular;  Laterality: N/A;    Allergies: Patient has no known allergies.  Medications: Prior to Admission medications   Medication Sig Start Date End Date Taking? Authorizing Provider  amiodarone (  PACERONE) 200 MG tablet Take 1 tablet (200 mg total) by mouth daily. 08/05/23   Lanae Boast, MD  apixaban (ELIQUIS) 5 MG TABS  tablet Take 5 mg by mouth 2 (two) times daily.    [provider]  arformoterol (BROVANA) 15 MCG/2ML NEBU Take 2 mLs (15 mcg total) by nebulization 2 (two) times daily. 08/04/23   Lanae Boast, MD  ascorbic acid (VITAMIN C) 250 MG tablet Take 1 tablet (250 mg total) by mouth 2 (two) times daily. 08/04/23   Lanae Boast, MD  aspirin 81 MG chewable tablet Chew 1 tablet (81 mg total) by mouth daily. 08/05/23   Lanae Boast, MD  bismuth subsalicylate (PEPTO BISMOL) 262 MG chewable tablet Chew 524 mg by mouth as needed for indigestion.    [provider]  budesonide (PULMICORT) 0.25 MG/2ML nebulizer solution Take 2 mLs (0.25 mg total) by nebulization 2 (two) times daily. 08/04/23   Lanae Boast, MD  Darbepoetin Alfa (ARANESP) 200 MCG/0.4ML SOSY injection Inject 0.4 mLs (200 mcg total) into the skin every Friday at 6 PM. 08/08/23   Lanae Boast, MD  folic acid (FOLVITE) 1 MG tablet Take 1 tablet (1 mg total) by mouth daily. 08/05/23   Lanae Boast, MD  heparin 16109 UT/250ML infusion Inject 1,900 Units/hr into the vein continuous. 08/04/23   Lanae Boast, MD  lactulose (CHRONULAC) 10 GM/15ML solution Take 45 mLs (30 g total) by mouth 2 (two) times daily as needed for mild constipation. 08/04/23   Lanae Boast, MD  loperamide HCl (IMODIUM) 1 MG/7.5ML suspension Take 15 mLs (2 mg total) by mouth 2 (two) times daily as needed for diarrhea or loose stools. 08/04/23   Lanae Boast, MD  methimazole (TAPAZOLE) 10 MG tablet Take 1 tablet (10 mg total) by mouth every 8 (eight) hours. 03/22/23 06/20/23  Rhetta Mura, MD  Metoprolol Tartrate 37.5 MG TABS Take 37.5 mg by mouth 2 (two) times daily.    [provider]  multivitamin (RENA-VIT) TABS tablet Take 1 tablet by mouth at bedtime. 08/04/23   Lanae Boast, MD  revefenacin (YUPELRI) 175 MCG/3ML nebulizer solution Take 3 mLs (175 mcg total) by nebulization daily. 08/05/23   Lanae Boast, MD  spironolactone (ALDACTONE) 25 MG tablet Take 25 mg by mouth daily.     [provider]  thiamine (VITAMIN B-1) 100 MG tablet Take 1 tablet (100 mg total) by mouth daily. 08/05/23   Lanae Boast, MD  zinc sulfate, 50mg  elemental zinc, 220 (50 Zn) MG capsule Take 1 capsule (220 mg total) by mouth daily. 08/05/23   Lanae Boast, MD     Family History  Problem Relation Age of Onset   Heart disease Other     Social History   Socioeconomic History   Marital status: Single    Spouse name: Not on file   Number of children: Not on file   Years of education: Not on file   Highest education level: Not on file  Occupational History   Not on file  Tobacco Use   Smoking status: Never   Smokeless tobacco: Never  Vaping Use   Vaping status: Never Used  Substance and Sexual Activity   Alcohol use: No   Drug use: Yes    Frequency: 3.0 times per week    Types: Marijuana   Sexual activity: Never  Other Topics Concern   Not on file  Social History Narrative   Not on file   Social Drivers of Health   Financial Resource Strain: Medium  Risk (04/01/2023)   Overall Financial Resource Strain (CARDIA)    Difficulty of Paying Living Expenses: Somewhat hard  Food Insecurity: No Food Insecurity (07/10/2023)   Hunger Vital Sign    Worried About Running Out of Food in the Last Year: Never true    Ran Out of Food in the Last Year: Never true  Transportation Needs: No Transportation Needs (07/10/2023)   PRAPARE - Administrator, Civil Service (Medical): No    Lack of Transportation (Non-Medical): No  Physical Activity: Not on file  Stress: Not on file  Social Connections: Not on file     Review of Systems: A 12 point ROS discussed and pertinent positives are indicated in the HPI above.  All other systems are negative.  Review of Systems  Constitutional:  Negative for fever.  Respiratory:  Positive for shortness of breath.   Cardiovascular:  Negative for chest pain.  Gastrointestinal:  Positive for nausea (this morning.) and vomiting (this morning).  Negative for abdominal pain.  Psychiatric/Behavioral:  Negative for confusion.     Vital Signs: BP 121/74 (BP Location: Left Arm)   Pulse (!) 104   Temp 98.7 F (37.1 C)   Resp 17   Ht 6\' 4"  (1.93 m)   Wt 181 lb 10.5 oz (82.4 kg)   SpO2 100%   BMI 22.11 kg/m     Physical Exam HENT:     Head: Normocephalic and atraumatic.     Mouth/Throat:     Mouth: Mucous membranes are moist.  Cardiovascular:     Rate and Rhythm: Normal rate and regular rhythm.  Pulmonary:     Effort: Pulmonary effort is normal.     Breath sounds: Normal breath sounds.  Abdominal:     General: Abdomen is flat.  Skin:    General: Skin is warm.  Neurological:     Mental Status: He is alert and oriented to person, place, and time.  Psychiatric:        Mood and Affect: Mood normal.        Thought Content: Thought content normal.        Judgment: Judgment normal.     Imaging: DG Abd 2 Views Result Date: 08/07/2023 CLINICAL DATA:  101717 Nausea 101717 EXAM: ABDOMEN - 2 VIEW COMPARISON:  07/31/2023 FINDINGS: The bowel gas pattern is normal. Enteric contrast is present within the colon and rectum. There is no evidence of free air. No radio-opaque calculi or other significant radiographic abnormality is seen. IMPRESSION: Negative. Electronically Signed   By: Duanne Guess D.O.   On: 08/07/2023 15:00   DG Swallowing Func-Speech Pathology Result Date: 08/04/2023 Table formatting from the original result was not included. Modified Barium Swallow Study Patient Details Name: Aaron [name] MRN: [id number] Date of Birth: 1985-07-30 Today's Date: 08/04/2023 HPI/PMH: HPI: AMARO MANGOLD is a 38 yo male presenting to ED 2/26 with chest pain and dyspnea. Admitted with bilateral lower lobe pulmonary emboli. VT/VF cardiac arrest 2/27, requiring 40 minutes of CPR and multiple shocks. Impella placed 2/27-3/12. ECMO initiated 2/27-3/8. CRRT stopped 3/15. ETT 2/27-3/12, reintubated 3/12-3/16 s/p mucus plugging and L lung  opacification. 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 MBS 3/18 showed gross silent aspiration of thin and nectar thick liquids. SLP recommended pt start diet of Dys 3 solids with honey thick liquids and initiated use of EMST. PMH includes hypothyroidism, paroxysmal A-fib, severe mitral regurgitation Clinical  Impression: Pt presents with moderate oropharyngeal dysphagia. While this is improved from [facility] 3/18, pt continues to have deficits related to timing and airway protection leading to penetration and aspiration of thin and nectar thick liquids. Pt's cough is intermittently more effective at clearing penetrates from the vocal folds, but he is not always able to expel penetrates completely (PAS 5). There was one instance of trace aspiration with thin liquids (PAS 8) that was not cleared by a cued cough. A chin tuck posture was effective in preventing penetrates from reaching the vocal folds (PAS 2). He continues to have mild pharyngeal residue. Recommend continuing regular diet but ugprading to thin liquids. He may benefit from full supervision initially to ensure he uses a chin tuck posture any time he drinks liquids. Encourage intermittent hard coughing. Pt is receptive to exercises that will improve his swallowing. SLP will f/u to continue targeting compensatory strategies and EMST with consideration of using a device with a higher threshold. DIGEST Swallow Severity Rating*  Safety: 2  Efficiency: 1  Overall Pharyngeal Swallow Severity: 2 (moderate) 1: mild; 2: moderate; 3: severe; 4: profound *The Dynamic Imaging Grade of Swallowing Toxicity is standardized for the head and neck cancer population, however, demonstrates promising clinical applications across populations to standardize the clinical rating of pharyngeal swallow safety and severity. Factors that may increase risk of adverse event in presence of  aspiration [name]: Factors that may increase risk of adverse event in presence of aspiration [name]: Poor general health and/or compromised immunity; Frail or deconditioned Recommendations/Plan: Swallowing Evaluation Recommendations Swallowing Evaluation Recommendations Recommendations: PO diet PO Diet Recommendation: Regular; Thin liquids (Level 0) Liquid Administration via: Cup; Straw Medication Administration: Whole meds with puree Supervision: Staff to assist with self-feeding; Full supervision/cueing for swallowing strategies Swallowing strategies  : Chin tuck; Hard cough after swallowing; Slow rate; Small bites/sips Postural changes: Position pt fully upright for meals Oral care recommendations: Oral care QID (4x/day); Oral care before PO; Use suctioning for oral care Recommended consults: Consider ENT consultation Treatment Plan Treatment Plan Treatment recommendations: Therapy as outlined in treatment plan below Follow-up recommendations: Acute inpatient rehab (3 hours/day) Functional status assessment: Patient has had a recent decline in their functional status and demonstrates the ability to make significant improvements in function in a reasonable and predictable amount of time. Treatment frequency: Min 2x/week Treatment duration: 2 weeks Interventions: Aspiration precaution training; Compensatory techniques; Patient/family education; Trials of upgraded texture/liquids; Diet toleration management by SLP; Respiratory muscle strength training Recommendations Recommendations for follow up therapy are one component of a multi-disciplinary discharge planning process, led by the attending physician.  Recommendations may be updated based on patient status, additional functional criteria and insurance authorization. Assessment: Orofacial Exam: Orofacial Exam Oral Cavity: Oral Hygiene: WFL Oral Cavity - Dentition: Adequate natural dentition Orofacial Anatomy: WFL Oral  Motor/Sensory Function: WFL Anatomy: Anatomy: WFL Boluses Administered: Boluses Administered Boluses Administered: Thin liquids (Level 0); Mildly thick liquids (Level 2, nectar thick); Moderately thick liquids (Level 3, honey thick); Solid  Oral Impairment Domain: Oral Impairment Domain Lip Closure: No labial escape Tongue control during bolus hold: Cohesive bolus between tongue to palatal seal Bolus preparation/mastication: Timely and efficient chewing and mashing Bolus transport/lingual motion: Brisk tongue motion Oral residue: Complete oral clearance Location of oral residue : N/A Initiation of pharyngeal swallow : Posterior angle of the ramus  Pharyngeal Impairment Domain: Pharyngeal Impairment Domain Soft palate elevation: No bolus between soft palate (SP)/pharyngeal wall (PW) Laryngeal elevation: Complete superior  movement of thyroid cartilage with complete approximation of arytenoids to epiglottic petiole Anterior hyoid excursion: Complete anterior movement Epiglottic movement: Complete inversion Laryngeal vestibule closure: Complete, no air/contrast in laryngeal vestibule Pharyngeal stripping wave : Present - complete Pharyngeal contraction (A/P view only): N/A Pharyngoesophageal segment opening: Complete distension and complete duration, no obstruction of flow Tongue base retraction: No contrast between tongue base and posterior pharyngeal wall (PPW) Pharyngeal residue: Trace residue within or on pharyngeal structures Location of pharyngeal residue: Valleculae; Pyriform sinuses  Esophageal Impairment Domain: No data recorded Pill: No data recorded Penetration/Aspiration Scale Score: Penetration/Aspiration Scale Score 1.  Material does not enter airway: Moderately thick liquids (Level 3, honey thick); Solid 4.  Material enters airway, CONTACTS cords then ejected out: Mildly thick liquids (Level 2, nectar thick) 8.  Material enters airway, passes BELOW cords without attempt by patient to eject out (silent  aspiration) : Thin liquids (Level 0) Compensatory Strategies: Compensatory Strategies Compensatory strategies: Yes Straw: Ineffective Ineffective Straw: Thin liquid (Level 0); Mildly thick liquid (Level 2, nectar thick) Chin tuck: Effective Effective Chin Tuck: Thin liquid (Level 0)   General Information: Caregiver present: No  Diet Prior to this Study: Regular; Moderately thick liquids (Level 3, honey thick)   Temperature : Normal   Respiratory Status: WFL   Supplemental O2: None (Room air)   History of Recent Intubation: Yes  Behavior/Cognition: Alert; Cooperative; Pleasant mood Self-Feeding Abilities: Able to self-feed Baseline vocal quality/speech: Dysphonic Volitional Cough: Able to elicit Volitional Swallow: Able to elicit Exam Limitations: No limitations Goal Planning: Prognosis for improved oropharyngeal function: Good Barriers to Reach Goals: Cognitive deficits; Time post onset; Severity of deficits No data recorded Patient/Family Stated Goal: none stated Consulted and agree with results and recommendations: Patient Pain: Pain Assessment Pain Assessment: No/denies pain End of Session: Start Time:SLP Start Time (ACUTE ONLY): 1426 Stop Time: SLP Stop Time (ACUTE ONLY): 1442 Time Calculation:SLP Time Calculation (min) (ACUTE ONLY): 16 min Charges: SLP Evaluations $ SLP Speech Visit: 1 Visit SLP Evaluations $MBS Swallow: 1 Procedure $Swallowing Treatment: 1 Procedure SLP visit diagnosis: SLP Visit Diagnosis: Dysphagia, oropharyngeal phase (R13.12) Past Medical History: Past Medical History: Diagnosis Date  Atrial fibrillation (HCC)   Hyperthyroidism   Mitral regurgitation   NSTEMI (non-ST elevated myocardial infarction) [facility])  Past Surgical History: Past Surgical History: Procedure Laterality Date  ECMO CANNULATION N/A 07/10/2023  Procedure: ECMO CANNULATION;  Surgeon: Dolores Patty, MD;  [facility] INV[facility] CV LAB;  Service: Cardiovascular;  Laterality: N/A;  EMBOLECTOMY Left 07/18/2023  Procedure:  EMBOLECTOMY Left Femoral, Popliteal and iliac arterys,  Repair Left common femoral artery.;  Surgeon: Victorino Sparrow, MD;  Location: [facility] OR;  Service: Vascular;  Laterality: Left;  INTRAOPERATIVE TRANSESOPHAGEAL ECHOCARDIOGRAM N/A 07/23/2023  Procedure: ECHOCARDIOGRAM, TRANSESOPHAGEAL, INTRAOPERATIVE;  Surgeon: Loreli Slot, MD;  Location: [facility] OR;  Service: Open Heart Surgery;  Laterality: N/A;  LEFT HEART CATH AND CORONARY ANGIOGRAPHY N/A 10/22/2021  Procedure: LEFT HEART CATH AND CORONARY ANGIOGRAPHY;  Surgeon: Yvonne Kendall, MD;  [facility]: [facility] INVASIVE CV LAB;  Service: Cardiovascular;  Laterality: N/A;  PLACEMENT OF IMPELLA LEFT VENTRICULAR ASSIST DEVICE Right 07/14/2023  Procedure: PLACEMENT OF RIGHT AXILLARY IMPELLA 5.5 LEFT VENTRICULAR ASSIST DEVICE;  Surgeon: Loreli Slot, MD;  Location: [facility] OR;  Service: Open Heart Surgery;  Laterality: Right;  REMOVAL OF IMPELLA LEFT VENTRICULAR ASSIST DEVICE Right 07/14/2023  Procedure: REMOVAL OF CP RIGHT FEMORAL IMPELLA LEFT VENTRICULAR ASSIST DEVICE;  Surgeon: Loreli Slot, MD;  Location: [facility] OR;  Service: Open  Heart Surgery;  Laterality: Right;  REMOVAL OF IMPELLA LEFT VENTRICULAR ASSIST DEVICE N/A 07/23/2023  Procedure: REMOVAL, CARDIAC ASSIST DEVICE, IMPELLA;  Surgeon: Loreli Slot, MD;  Location: [facility] OR;  Service: Open Heart Surgery;  Laterality: N/A;  TRANSCATHETER MITRAL EDGE TO EDGE REPAIR N/A 07/16/2023  Procedure: TRANSCATHETER MITRAL EDGE TO EDGE REPAIR;  Surgeon: Tonny Bollman, MD;  Location: [facility] INVASIVE CV LAB;  Service: Cardiovascular;  Laterality: N/A;  TRANSESOPHAGEAL ECHOCARDIOGRAM (CATH LAB) N/A 07/16/2023  Procedure: TRANSESOPHAGEAL ECHOCARDIOGRAM;  Surgeon: Tonny Bollman, MD;  Location: [facility] INVASIVE CV LAB;  Service: Cardiovascular;  Laterality: N/A;  VENTRICULAR ASSIST DEVICE INSERTION N/A 07/10/2023  Procedure: VENTRICULAR ASSIST DEVICE INSERTION;  Surgeon: Dolores Patty, MD;  [facility]: [facility] INVAS[facility] CV LAB;  Service:  Cardiovascular;  Laterality: N/A; Gwynneth Aliment, M.A., CF-SLP Speech Language Pathology, Acute Rehabilitation Services Secure Chat preferred [phone] 08/04/2023, 3:43 PM  ECHOCARDIOGRAM COMPLETE Result Date: 08/01/2023    ECHOCARDIOGRAM REPORT   Patient Name:   DEONTRAE DRINKARD Date of Exam: 08/01/2023 Medical Rec #:  [id number]    Height:       76.0 in Accession #:    [id number]   Weight:       173.1 lb Date of Birth:  [birth year]    BSA:          2.083 m Patient Age:    37 years     BP:           150/92 mmHg Patient Gender: M            HR:           82 bpm. Exam Location:  Inpatient Procedure: 2D Echo (Both Spectral and Color Flow Doppler were utilized during            procedure). Indications:    mitral valve disorder  History:        Patient has prior history of Echocardiogram examinations, most                 recent 07/21/2023. Arrythmias:Cardiac Arrest; Risk                 Factors:Hypertension.                  Mitral Valve: Mitra-Clip valve is present in the mitral                 position. Procedure Date: 07/16/2023.  Sonographer:    Delcie Roch RDCS Referring Phys: [phone] ALMA L DIAZ IMPRESSIONS  1. Left ventricular ejection fraction, by estimation, is 40 to 45%. Left ventricular ejection fraction by PLAX is 41 %. The left ventricle has mildly decreased function. The left ventricle demonstrates global hypokinesis. There is mild left ventricular hypertrophy. Left ventricular diastolic parameters are consistent with Grade I diastolic dysfunction (impaired relaxation).  2. Right ventricular systolic function is normal. The right ventricular size is normal. Tricuspid regurgitation signal is inadequate for assessing PA pressure.  3. Left atrial size was mildly dilated.  4. Right atrial size was mildly dilated.  5. 2 Mitra-clips noted in the A2-P2 position. The valve is hypermobile with the SAM of the anterior mitral leaflet, but no outflow obstruction - there is moderate stenosis with an expected double  oreface valve. The mitral valve has been repaired/replaced. Mild mitral valve regurgitation. Moderate mitral stenosis. The mean mitral valve gradient is 7.0 mmHg with average heart rate of 79 bpm. There is a Mitra-Clip present in the mitral position. Procedure Date: 07/16/2023.  6. The aortic valve is tricuspid. Aortic valve regurgitation is trivial. Aortic valve sclerosis/calcification is present, without any evidence of aortic stenosis.  7. The inferior vena cava is normal in size with greater than 50% respiratory variability, suggesting right atrial pressure of 3 mmHg.  8. Evidence of atrial level shunting detected by color flow Doppler. There is a small secundum atrial septal defect with predominantly left to right shunting across the atrial septum. Comparison(s): Changes from prior study are noted. 07/21/2023: LVEF 35-40%. FINDINGS  Left Ventricle: Left ventricular ejection fraction, by estimation, is 40 to 45%. Left ventricular ejection fraction by PLAX is 41 %. The left ventricle has mildly decreased function. The left ventricle demonstrates global hypokinesis. The left ventricular internal cavity size was normal in size. There is mild left ventricular hypertrophy. Left ventricular diastolic parameters are consistent with Grade I diastolic dysfunction (impaired relaxation). Indeterminate filling pressures. Right Ventricle: The right ventricular size is normal. No increase in right ventricular wall thickness. Right ventricular systolic function is normal. Tricuspid regurgitation signal is inadequate for assessing PA pressure. Left Atrium: Left atrial size was mildly dilated. Right Atrium: Right atrial size was mildly dilated. Pericardium: There is no evidence of pericardial effusion. Mitral Valve: 2 Mitra-clips noted in the A2-P2 position. The valve is hypermobile with the SAM of the anterior mitral leaflet, but no outflow obstruction - there is moderate stenosis with an expected double oreface valve. The mitral  valve has been repaired/replaced. Mild mitral valve regurgitation. There is a Mitra-Clip present in the mitral position. Procedure Date: 07/16/2023. Moderate mitral valve stenosis. MV peak gradient, 15.1 mmHg. The mean mitral valve gradient is 7.0 mmHg with average heart  rate of 79 bpm. Tricuspid Valve: The tricuspid valve is grossly normal. Tricuspid valve regurgitation is trivial. Aortic Valve: The aortic valve is tricuspid. Aortic valve regurgitation is trivial. Aortic valve sclerosis/calcification is present, without any evidence of aortic stenosis. Pulmonic Valve: The pulmonic valve was normal in structure. Pulmonic valve regurgitation is not visualized. Aorta: The aortic root and ascending aorta are structurally normal, with no evidence of dilitation. Venous: The inferior vena cava is normal in size with greater than 50% respiratory variability, suggesting right atrial pressure of 3 mmHg. IAS/Shunts: Evidence of atrial level shunting detected by color flow Doppler. There is a small secundum atrial septal defect with predominantly left to right shunting across the atrial septum.  LEFT VENTRICLE PLAX 2D LV EF:         Left            Diastology                ventricular     LV e' medial:    5.98 cm/s                ejection        LV E/e' medial:  23.9                fraction by     LV e' lateral:   7.62 cm/s                PLAX is 41      LV E/e' lateral: 18.8                %. LVIDd:         5.50 cm LVIDs:         4.40 cm LV PW:         1.00 cm LV IVS:  1.10 cm LVOT diam:     2.60 cm LV SV:         104 LV SV Index:   50 LVOT Area:     5.31 cm  RIGHT VENTRICLE             IVC RV Basal diam:  3.10 cm     IVC diam: 1.80 cm RV S prime:     16.30 cm/s TAPSE (M-mode): 2.5 cm LEFT ATRIUM             Index        RIGHT ATRIUM           Index LA diam:        4.00 cm 1.92 cm/m   RA Area:     19.10 cm LA Vol (A2C):   84.7 ml 40.66 ml/m  RA Volume:   48.40 ml  23.23 ml/m LA Vol (A4C):   72.4 ml 34.76 ml/m LA  Biplane Vol: 82.7 ml 39.70 ml/m  AORTIC VALVE LVOT Vmax:   130.00 cm/s LVOT Vmean:  85.000 cm/s LVOT VTI:    0.196 m  AORTA Ao Root diam: 3.20 cm Ao Asc diam:  3.50 cm MITRAL VALVE MV Area VTI:  1.94 cm      SHUNTS MV Peak grad: 15.1 mmHg     Systemic VTI:  0.20 m MV Mean grad: 7.0 mmHg      Systemic Diam: 2.60 cm MV Vmax:      1.94 m/s MV Vmean:     131.0 cm/s MV E velocity: 143.00 cm/s MV A velocity: 191.00 cm/s MV E/A ratio:  0.75 Zoila Shutter MD Electronically signed by Zoila Shutter MD Signature Date/Time: 08/01/2023/5:47:48 PM    Final    DG Abd 1 View Result Date: 07/31/2023 CLINICAL DATA:  Feeding tube placement. EXAM: ABDOMEN - 1 VIEW COMPARISON:  Abdominal radiograph dated 07/24/2023. FINDINGS: Feeding tube with tip in the region of the distal stomach. IMPRESSION: Feeding tube with tip in the distal stomach. Electronically Signed   By: Elgie Collard M.D.   On: 07/31/2023 09:37   DG CHEST PORT 1 VIEW Result Date: 07/31/2023 CLINICAL DATA:  Dialysis catheter dysfunction. EXAM: PORTABLE CHEST 1 VIEW COMPARISON:  Chest radiograph dated 07/25/2023. FINDINGS: Interval removal of the right-sided PICC and left IJ central venous line. Left subclavian catheter with tip over upper SVC. Feeding tube extends below the diaphragm with tip beyond the inferior margin of the image. No significant interval change in bilateral pulmonary opacities and small left pleural effusion. No pneumothorax. Stable cardiac silhouette. No acute osseous pathology. IMPRESSION: 1. Interval removal of the right-sided PICC and left IJ central venous line. 2. No significant interval change in bilateral pulmonary opacities and small left pleural effusion. Electronically Signed   By: Elgie Collard M.D.   On: 07/31/2023 09:37   DG Swallowing Func-Speech Pathology Result Date: 07/29/2023 Table formatting from the original result was not included. Modified Barium Swallow Study Patient Details Name: Aaron [name] MRN: [id number] Date of  Birth: Aug 03, 1985 Today's Date: 07/29/2023 HPI/PMH: HPI: NOWELL SITES is a 38 yo male presenting to ED 2/26 with chest pain and dyspnea. Admitted with bilateral lower lobe pulmonary emboli. VT/VF cardiac arrest 2/27, requiring 40 minutes of CPR and multiple shocks. Impella placed 2/27-3/12. ECMO initiated 2/27-3/8. CRRT stopped 3/15. ETT 2/27-3/12, reintubated 3/12-3/16 s/p mucus plugging and L lung opacification. MRI 3/13 shows small infarcts in the R cerebral and cerebellar hemispheres without generalized finding to indicate a  global anoxic injury. PMH includes hypothyroidism, paroxysmal A-fib, severe mitral regurgitation Clinical Impression: Pt exhibits severe pharyngeal dysphagia with concern for glottal insufficiency after prolonged intubation. He remains aphonic with a huff-like cough that is ineffective at clearing his airway. Thin and nectar thick liquids initially reached the level of the vocal folds before progressing further and being silently aspirated (PAS 8). A L and R head turn resulted in gross silent aspiration. Pt denies the urge to cough and his cued cough lacks crispness and strength. Recommend Dys 3 solids with honey thick liquids via cup or spoon and full supervision. Given difference in presentation when pt performed head turns in addition to aphonia, recommend ENT consult to assess vocal fold function. SLP will continue following. DIGEST Swallow Severity Rating*  Safety: 4  Efficiency: 1  Overall Pharyngeal Swallow Severity: 3 (severe) 1: mild; 2: moderate; 3: severe; 4: profound *The Dynamic Imaging Grade of Swallowing Toxicity is standardized for the head and neck cancer population, however, demonstrates promising clinical applications across populations to standardize the clinical rating of pharyngeal swallow safety and severity. Factors that may increase risk of adverse event in presence of aspiration [name]: Factors that may increase risk of adverse event in presence of  aspiration [name]: Poor general health and/or compromised immunity; Frail or deconditioned; Presence of tubes (ETT, trach, NG, etc.) Recommendations/Plan: Swallowing Evaluation Recommendations Swallowing Evaluation Recommendations Recommendations: PO diet PO Diet Recommendation: Dysphagia 3 (Mechanical soft); Moderately thick liquids (Level 3, honey thick) Liquid Administration via: Spoon; Cup Medication Administration: Crushed with puree Supervision: Staff to assist with self-feeding; Full supervision/cueing for swallowing strategies Swallowing strategies  : Minimize environmental distractions; Slow rate; Small bites/sips Postural changes: Position pt fully upright for meals; Stay upright 30-60 min after meals Oral care recommendations: Oral care QID (4x/day); Oral care before PO; Use suctioning for oral care Recommended consults: Consider ENT consultation Caregiver Recommendations: Avoid jello, ice cream, thin soups, popsicles; Remove water pitcher Treatment Plan Treatment Plan Treatment recommendations: Therapy as outlined in treatment plan below Follow-up recommendations: Acute inpatient rehab (3 hours/day) Functional status assessment: Patient has had a recent decline in their functional status and demonstrates the ability to make significant improvements in function in a reasonable and predictable amount of time. Treatment frequency: Min 2x/week Treatment duration: 2 weeks Interventions: Aspiration precaution training; Compensatory techniques; Patient/family education; Trials of upgraded texture/liquids; Diet toleration management by SLP; Respiratory muscle strength training Recommendations Recommendations for follow up therapy are one component of a multi-disciplinary discharge planning process, led by the attending physician.  Recommendations may be updated based on patient status, additional functional criteria and insurance authorization. Assessment: Orofacial Exam: Orofacial Exam Oral  Cavity: Oral Hygiene: WFL Oral Cavity - Dentition: Adequate natural dentition Orofacial Anatomy: WFL Oral Motor/Sensory Function: WFL Anatomy: Anatomy: WFL Boluses Administered: Boluses Administered Boluses Administered: Thin liquids (Level 0); Mildly thick liquids (Level 2, nectar thick); Moderately thick liquids (Level 3, honey thick); Puree; Solid  Oral Impairment Domain: Oral Impairment Domain Lip Closure: Interlabial escape, no progression to anterior lip Tongue control during bolus hold: Cohesive bolus between tongue to palatal seal Bolus preparation/mastication: Timely and efficient chewing and mashing Bolus transport/lingual motion: Brisk tongue motion Oral residue: Complete oral clearance Location of oral residue : N/A Initiation of pharyngeal swallow : Posterior laryngeal surface of the epiglottis  Pharyngeal Impairment Domain: Pharyngeal Impairment Domain Soft palate elevation: No bolus between soft palate (SP)/pharyngeal wall (PW) Laryngeal elevation: Complete superior movement of thyroid cartilage with complete approximation  of arytenoids to epiglottic petiole Anterior hyoid excursion: Complete anterior movement Epiglottic movement: Complete inversion Laryngeal vestibule closure: Complete, no air/contrast in laryngeal vestibule Pharyngeal stripping wave : Present - complete Pharyngeal contraction (A/P view only): N/A Pharyngoesophageal segment opening: Complete distension and complete duration, no obstruction of flow Tongue base retraction: Trace column of contrast or air between tongue base and PPW Pharyngeal residue: Trace residue within or on pharyngeal structures Location of pharyngeal residue: Valleculae; Pyriform sinuses  Esophageal Impairment Domain: No data recorded Pill: No data recorded Penetration/Aspiration Scale Score: Penetration/Aspiration Scale Score 1.  Material does not enter airway: Moderately thick liquids (Level 3, honey thick); Puree; Solid 5.  Material enters airway, CONTACTS  cords and not ejected out: Mildly thick liquids (Level 2, nectar thick) 8.  Material enters airway, passes BELOW cords without attempt by patient to eject out (silent aspiration) : Thin liquids (Level 0) Compensatory Strategies: Compensatory Strategies Compensatory strategies: Yes Straw: Ineffective Ineffective Straw: Thin liquid (Level 0); Mildly thick liquid (Level 2, nectar thick) Chin tuck: Effective; Ineffective Effective Chin Tuck: Mildly thick liquid (Level 2, nectar thick) Ineffective Chin Tuck: Thin liquid (Level 0) Left head turn: Ineffective Ineffective Left Head Turn: Thin liquid (Level 0) Right head turn: Ineffective Ineffective Right Head Turn: Thin liquid (Level 0)   General Information: Caregiver present: No  Diet Prior to this Study: NPO; Cortrak/Small bore NG tube   Temperature : Normal   Respiratory Status: WFL   Supplemental O2: Nasal cannula   History of Recent Intubation: Yes  Behavior/Cognition: Alert; Cooperative; Requires cueing Self-Feeding Abilities: Dependent for feeding Baseline vocal quality/speech: Aphonic Volitional Cough: Able to elicit Volitional Swallow: Able to elicit Exam Limitations: No limitations Goal Planning: Prognosis for improved oropharyngeal function: Good Barriers to Reach Goals: Cognitive deficits; Time post onset; Severity of deficits No data recorded Patient/Family Stated Goal: none stated Consulted and agree with results and recommendations: Patient; Nurse Pain: Pain Assessment Pain Assessment: No/denies pain Faces Pain Scale: 0 End of Session: Start Time:SLP Start Time (ACUTE ONLY): 1255 Stop Time: SLP Stop Time (ACUTE ONLY): 1316 Time Calculation:SLP Time Calculation (min) (ACUTE ONLY): 21 min Charges: SLP Evaluations $ SLP Speech Visit: 1 Visit SLP Evaluations $BSS Swallow: 1 Procedure $MBS Swallow: 1 Procedure SLP visit diagnosis: SLP Visit Diagnosis: Dysphagia, pharyngeal phase (R13.13) Past Medical History: Past Medical History: Diagnosis Date  Atrial  fibrillation (HCC)   Hyperthyroidism   Mitral regurgitation   NSTEMI (non-ST elevated myocardial infarction) [facility])  Past Surgical History: Past Surgical History: Procedure Laterality Date  ECMO CANNULATION N/A 07/10/2023  Procedure: ECMO CANNULATION;  Surgeon: Dolores Patty, MD;  Location: [facility] INVASIVE CV LAB;  Service: Cardiovascular;  Laterality: N/A;  EMBOLECTOMY Left 07/18/2023  Procedure: EMBOLECTOMY Left Femoral, Popliteal and iliac arterys,  Repair Left common femoral artery.;  Surgeon: Victorino Sparrow, MD;  Location: [facility] OR;  Service: Vascular;  Laterality: Left;  INTRAOPERATIVE TRANSESOPHAGEAL ECHOCARDIOGRAM N/A 07/23/2023  Procedure: ECHOCARDIOGRAM, TRANSESOPHAGEAL, INTRAOPERATIVE;  Surgeon: Loreli Slot, MD;  Location: [facility] OR;  Service: Open Heart Surgery;  Laterality: N/A;  LEFT HEART CATH AND CORONARY ANGIOGRAPHY N/A 10/22/2021  Procedure: LEFT HEART CATH AND CORONARY ANGIOGRAPHY;  Surgeon: Yvonne Kendall, MD;  Location: [facility] INVASIVE CV LAB;  Service: Cardiovascular;  Laterality: N/A;  PLACEMENT OF IMPELLA LEFT VENTRICULAR ASSIST DEVICE Right 07/14/2023  Procedure: PLACEMENT OF RIGHT AXILLARY IMPELLA 5.5 LEFT VENTRICULAR ASSIST DEVICE;  Surgeon: Loreli Slot, MD;  Location: [facility] OR;  Service: Open Heart Surgery;  Laterality: Right;  REMOVAL OF  IMPELLA LEFT VENTRICULAR ASSIST DEVICE Right 07/14/2023  Procedure: REMOVAL OF CP RIGHT FEMORAL IMPELLA LEFT VENTRICULAR ASSIST DEVICE;  Surgeon: Loreli Slot, MD;  Location: [facility] OR;  Service: Open Heart Surgery;  Laterality: Right;  REMOVAL OF IMPELLA LEFT VENTRICULAR ASSIST DEVICE N/A 07/23/2023  Procedure: REMOVAL, CARDIAC ASSIST DEVICE, IMPELLA;  Surgeon: Loreli Slot, MD;  Location: [facility] OR;  Service: Open Heart Surgery;  Laterality: N/A;  TRANSCATHETER MITRAL EDGE TO EDGE REPAIR N/A 07/16/2023  Procedure: TRANSCATHETER MITRAL EDGE TO EDGE REPAIR;  Surgeon: Tonny Bollman, MD;  Location: [facility] INVASIVE CV LAB;  Service: Cardiovascular;   Laterality: N/A;  TRANSESOPHAGEAL ECHOCARDIOGRAM (CATH LAB) N/A 07/16/2023  Procedure: TRANSESOPHAGEAL ECHOCARDIOGRAM;  Surgeon: Tonny Bollman, MD;  Location: [facility] INVASIVE CV LAB;  Service: Cardiovascular;  Laterality: N/A;  VENTRICULAR ASSIST DEVICE INSERTION N/A 07/10/2023  Procedure: VENTRICULAR ASSIST DEVICE INSERTION;  Surgeon: Dolores Patty, MD;  Location: [facility] INVASIVE CV LAB;  Service: Cardiovascular;  Laterality: N/A; Gwynneth Aliment, M.A., CF-SLP Speech Language Pathology, Acute Rehabilitation Services Secure Chat preferred [phone] 07/29/2023, 1:49 PM  DG Chest Port 1 View Result Date: 07/25/2023 CLINICAL DATA:  ET tube placement EXAM: PORTABLE CHEST 1 VIEW COMPARISON:  X-ray 07/24/2023 and older FINDINGS: Stable ET tube, enteric tube, right-sided PICC, left IJ line and left subclavian line. Metallic foci overlie the left side of the heart as well. There are surgical clips and skin staples along the right shoulder, axillary region. Stable cardiopericardial silhouette with vascular congestion. Persistent left retrocardiac opacity. No pneumothorax. The inferior costophrenic angles also show a possible small left effusion. IMPRESSION: No significant interval change when adjusted for technique Electronically Signed   By: Karen Kays M.D.   On: 07/25/2023 10:13   DG Chest Port 1 View Result Date: 07/24/2023 CLINICAL DATA:  Status post central line placement. EXAM: PORTABLE CHEST 1 VIEW COMPARISON:  July 24, 2023 (1:24 a.m.) FINDINGS: Since the prior study there is been further advancement of the previously noted left-sided subclavian catheter. Its distal tip now sits within the distal aspect of the superior vena cava. The additional catheters, ET tube and orogastric tube seen on the prior study are unchanged in position. Since the prior exam there is improved aeration of the left upper lobe with persistent mild to moderate severity bilateral left perihilar and right infrahilar infiltrates. Moderate  severity left basilar atelectasis and/or infiltrate is seen within the retrocardiac region of the left lung base. There is a small left pleural effusion. No pneumothorax is identified. The visualized skeletal structures are unremarkable. IMPRESSION: 1. Interval advancement of the left-sided subclavian catheter with its distal tip now within the distal aspect of the superior vena cava. 2. Improved aeration of the left upper lobe with persistent bilateral left perihilar and right infrahilar infiltrates. 3. Moderate severity left basilar atelectasis and/or infiltrate. 4. Small left pleural effusion. Electronically Signed   By: Aram Candela M.D.   On: 07/24/2023 19:34   MR BRAIN WO CONTRAST Result Date: 07/24/2023 CLINICAL DATA:  Anoxic brain damage.  Cardiac arrest EXAM: MRI HEAD WITHOUT CONTRAST TECHNIQUE: Multiplanar, multiecho pulse sequences of the brain and surrounding structures were obtained without intravenous contrast. COMPARISON:  Head CT 07/10/2023 FINDINGS: Brain: Small acute infarcts in the right cerebral white matter and peripheral right cerebellum. No indication of superimposed global anoxic injury. No hydrocephalus, mass, or collection. Minimal blood products at right occipital white matter infarction attributed to petechial hemorrhage in this setting. Vascular: Major flow voids are preserved Skull and upper cervical spine: Normal  marrow signal Sinuses/Orbits: Extensive bilateral mastoid opacification in the setting of intubation. IMPRESSION: Small acute infarcts in the right cerebral and cerebellar hemispheres. No generalized finding to implicate global anoxic injury. Electronically Signed   By: Tiburcio Pea M.D.   On: 07/24/2023 10:33   DG Abd 1 View Result Date: 07/24/2023 CLINICAL DATA:  5626 Acute respiratory failure [facility]) [id number] Encounter for feeding tube placement [id number] EXAM: ABDOMEN - 1 VIEW COMPARISON:  X-ray abdomen 07/17/2023, chest abdomen pelvis 07/10/2023 FINDINGS:  Enteric tube with tip and side port overlying the gastric lumen. The bowel gas pattern is normal. No radio-opaque calculi or other significant radiographic abnormality are seen. IMPRESSION: Enteric tube in good position. Electronically Signed   By: Tish Frederickson M.D.   On: 07/24/2023 02:48   DG Chest Port 1 View Result Date: 07/24/2023 CLINICAL DATA:  Acute respiratory failure EXAM: PORTABLE CHEST 1 VIEW COMPARISON:  Chest x-ray 07/23/2023.  Chest CT 07/10/2023. FINDINGS: Endotracheal tube tip is 5.2 cm above the carina. Right-sided central venous catheter tip ends in the SVC. Left IJ catheter ends in the SVC. Left subclavian catheter ends in the level of the brachiocephalic vein, unchanged. Enteric tube extends below the diaphragm. The heart is enlarged, unchanged. There central pulmonary vascular congestion. There is improved aeration in the left lower lung. There is complete opacification of the upper right hemithorax similar to prior. There is a small layering left pleural effusion. Osseous structures are stable. IMPRESSION: 1. Improved aeration in the left lower lung. 2. Stable complete opacification of the upper right hemithorax. 3. Small layering left pleural effusion. 4. Stable cardiomegaly and central pulmonary vascular congestion. Electronically Signed   By: Darliss Cheney M.D.   On: 07/24/2023 02:32   DG Chest Port 1 View Result Date: 07/24/2023 CLINICAL DATA:  Acute respiratory failure, hypoxia EXAM: PORTABLE CHEST 1 VIEW COMPARISON:  07/22/2023 FINDINGS: There is abrupt cut off of the left mainstem bronchus and there is now complete atelectasis of the left lung with marked mediastinal shift to the left and complete opacification of left hemithorax. The findings suggest a obstructing endobronchial lesion such as a mucous plug or aspirated foreign body. Left subclavian temporary hemodialysis catheter left internal jugular central venous catheter are unchanged in position though deviated to the left  by mediastinal shift. Right upper extremity PICC line has been placed with its tip overlying the expected superior vena cava. Right lung is clear. No pneumothorax. No pleural effusion right. IMPRESSION: 1. Complete atelectasis of the left lung with marked mediastinal shift to the left and complete opacification of left hemithorax. The findings suggest a obstructing endobronchial lesion such as a mucous plug or aspirated foreign body. 2. Interval placement of right upper extremity PICC line, tip within the superior vena cava. Electronically Signed   By: Helyn Numbers M.D.   On: 07/24/2023 00:53   [place] EKG SITE RITE Result Date: 07/23/2023 If Site Rite image not attached, placement could not be confirmed due to current cardiac rhythm.  DG Chest Port 1 View Result Date: 07/22/2023 CLINICAL DATA:  Follow-up left basilar atelectasis EXAM: PORTABLE CHEST 1 VIEW COMPARISON:  07/20/2023 FINDINGS: Endotracheal tube and gastric catheter are noted in satisfactory position. Swan-Ganz catheter is noted in the pulmonary outflow tract. Impella catheter is noted from the right arm. Left jugular central line is noted at the cavoatrial junction. Temporary dialysis catheter is noted on the left with the tip in the left innominate vein. The lungs are well aerated bilaterally. Persistent left retrocardiac  density is noted. Mitra clips are seen. IMPRESSION: Tubes and lines as described above. Persistent left basilar consolidation. Electronically Signed   By: Alcide Clever M.D.   On: 07/22/2023 10:39   ECHOCARDIOGRAM LIMITED Result Date: 07/21/2023    ECHOCARDIOGRAM LIMITED REPORT   Patient Name:   Aaron [name] Date of Exam: 07/21/2023 Medical Rec #:  [id number]    Height:       76.0 in Accession #:    [id number]   Weight:       200.2 lb Date of Birth:  [birth year]    BSA:          2.216 m Patient Age:    37 years     BP:           0/0 mmHg Patient Gender: M            HR:           92 bpm. Exam Location:  Inpatient Procedure: Limited  Echo, Limited Color Doppler and Cardiac Doppler (Both            Spectral and Color Flow Doppler were utilized during procedure). Indications:    CHF  History:        Patient has no prior history of Echocardiogram examinations and                 Patient has prior history of Echocardiogram examinations, most                 recent 07/19/2023. Mitral Valve Prolapse, Arrythmias:Cardiac                 Arrest and Atrial Fibrillation; Risk Factors:Hypertension.  Sonographer:    Amy Chionchio Referring Phys: [id number] DALTON S MCLEAN IMPRESSIONS  1. Impella 5.5 in LV, inflow is about 5.5 cm from the aortic valve. Position looks acceptable. Left ventricular ejection fraction, by estimation, is 35 to 40%. The left ventricle has moderately decreased function. The left ventricle demonstrates regional wall motion abnormalities with anterior and septal hypokinesis. There is mild concentric left ventricular hypertrophy.  2. Right ventricular systolic function is normal. The right ventricular size is normal.  3. S/p Mitraclip with mild residual mitral regurgitation. Mean gradient 7 mmHg.  4. Limited echo for Impella. FINDINGS  Left Ventricle: Impella 5.5 in LV, inflow is about 5.5 cm from the aortic valve. Position looks acceptable. Left ventricular ejection fraction, by estimation, is 35 to 40%. The left ventricle has moderately decreased function. The left ventricle demonstrates regional wall motion abnormalities. The left ventricular internal cavity size was normal in size. There is mild concentric left ventricular hypertrophy. Right Ventricle: The right ventricular size is normal. Right ventricular systolic function is normal. Left Atrium: Left atrial size was normal in size. Right Atrium: Right atrial size was normal in size. Mitral Valve: S/p Mitraclip with mild residual mitral regurgitation. Mean gradient 7 mmHg. There is a Mitra-Clip present in the mitral position. MV peak gradient, 11.8 mmHg. The mean mitral valve gradient is  6.5 mmHg. Aorta: The aortic root and ascending aorta are structurally normal, with no evidence of dilitation. LEFT VENTRICLE PLAX 2D LVIDd:         4.60 cm LVIDs:         4.10 cm LV PW:         1.20 cm LV IVS:        1.20 cm  RIGHT VENTRICLE RV Basal diam:  3.70 cm RV Mid diam:  3.30 cm RV S prime:     13.40 cm/s TAPSE (M-mode): 1.9 cm RIGHT ATRIUM           Index RA Area:     16.70 cm RA Volume:   50.10 ml  22.61 ml/m   AORTA Ao Asc diam: 3.20 cm MITRAL VALVE MV Peak grad: 11.8 mmHg MV Mean grad: 6.5 mmHg MV Vmax:      1.72 m/s MV Vmean:     122.0 cm/s Dalton McleanMD Electronically signed by Wilfred Lacy Signature Date/Time: 07/21/2023/2:57:35 PM    Final    ECHOCARDIOGRAM LIMITED Result Date: 07/21/2023    ECHOCARDIOGRAM LIMITED REPORT   Patient Name:   Aaron [name] Date of Exam: 07/20/2023 Medical Rec #:  [id number]    Height:       76.0 in Accession #:    [id number]   Weight:       199.1 lb Date of Birth:  [birth year]    BSA:          2.211 m Patient Age:    37 years     BP:           141/71 mmHg Patient Gender: M            HR:           58 bpm. Exam Location:  Inpatient Procedure: 2D Echo, Cardiac Doppler and Color Doppler (Both Spectral and Color            Flow Doppler were utilized during procedure). Indications:    I42.9 Cardiomyopathy (unspecified)  History:        Patient has prior history of Echocardiogram examinations, most                 recent 07/18/2023. Acute MI, Abnormal ECG, Mitral Valve Disease,                 Arrythmias:Atrial Fibrillation and Cardiac Arrest;                 Signs/Symptoms:Chest Pain. ECMO. Impella device present. S/P                 MTEER X3 XTW clips. Pulmonary embolus. Cardiac shock.                  Mitral Valve: Mitra-Clip valve is present in the mitral                 position.  Sonographer:    Sheralyn Boatman RDCS Referring Phys: [id number] [facility]  Sonographer Comments: Technically difficult study due to poor echo windows and echo performed with patient supine and  on artificial respirator. IMPRESSIONS  1. Impella 3.7cm from the aortic valve annulus. Left ventricular ejection fraction, by estimation, is 35 to 40%. The left ventricle has moderately decreased function.  2. Right ventricular systolic function is mildly reduced. The right ventricular size is mildly enlarged.  3. Left atrial size was mildly dilated.  4. Right atrial size was mildly dilated.  5. The mitral valve has been repaired/replaced. Trivial mitral valve regurgitation. There is a Mitra-Clip present in the mitral position.  6. The inferior vena cava is dilated in size with <50% respiratory variability, suggesting right atrial pressure of 15 mmHg. FINDINGS  Left Ventricle: Impella 3.7cm from the aortic valve annulus. Left ventricular ejection fraction, by estimation, is 35 to 40%. The left ventricle has moderately decreased function. Right Ventricle: The right ventricular size is mildly enlarged. Right ventricular systolic function is  mildly reduced. Left Atrium: Left atrial size was mildly dilated. Right Atrium: Right atrial size was mildly dilated. Pericardium: There is no evidence of pericardial effusion. Mitral Valve: The mitral valve has been repaired/replaced. Trivial mitral valve regurgitation. There is a Mitra-Clip present in the mitral position. Venous: The inferior vena cava is dilated in size with less than 50% respiratory variability, suggesting right atrial pressure of 15 mmHg. Additional Comments: Spectral Doppler performed. Color Doppler performed.  LEFT VENTRICLE PLAX 2D LVIDd:         5.00 cm LVIDs:         4.20 cm LV PW:         1.30 cm LV IVS:        1.70 cm  IVC IVC diam: 3.10 cm Aditya Sabharwal Electronically signed by Dorthula Nettles Signature Date/Time: 07/21/2023/10:51:41 AM    Final    DG Chest Port 1 View Result Date: 07/20/2023 CLINICAL DATA:  16109 ARDS (adult respiratory distress syndrome) (HCC) 60454 EXAM: PORTABLE CHEST 1 VIEW COMPARISON:  July 19, 2023, March seventh 2025  FINDINGS: The cardiomediastinal silhouette is unchanged in contour.Impella catheter. ETT tip terminates 6.4 cm above the carina. RIGHT IJ PA catheter tip terminates over the RIGHT main pulmonary artery proximally. LEFT IJ CVC tip terminates over the superior cavoatrial junction. LEFT subclavian CVC tip terminates over the LEFT brachiocephalic vein. The enteric tube courses through the chest to the abdomen beyond the field-of-view. Trace LEFT pleural effusion. No pneumothorax. Interval marked improvement in aeration in the LEFT lung with mild residual LEFT retrocardiac opacity likely reflecting residual atelectasis. Some patchy reticulonodular opacities persist in the bilateral perihilar areas, nonspecific. Enteric contrast delineates the stomach. IMPRESSION: 1. Support apparatus as described above. 2. Interval marked improvement in aeration in the LEFT lung with mild residual LEFT retrocardiac opacity likely reflecting residual atelectasis. 3. Some patchy reticulonodular opacities persist in the bilateral perihilar areas, nonspecific. Electronically Signed   By: Meda Klinefelter M.D.   On: 07/20/2023 10:16   DG CHEST PORT 1 VIEW Result Date: 07/19/2023 CLINICAL DATA:  Line placement EXAM: PORTABLE CHEST 1 VIEW COMPARISON:  X-ray earlier 07/19/2023 at 9:38 a.m. Older exams as well FINDINGS: Stable ET tube, enteric tube, left IJ line. The Swan-Ganz catheter via the right IJ is more in the right pulmonary artery. There is a ECMO catheter in place along the cardiac silhouette. Interval placement of a left subclavian line with tip overlying the upper mediastinum. This is not cross to the right side. Exact tip location is uncertain. There is increasing opacification along left hemithorax with increasing fluid. The heart is enlarged. Vascular congestion of the right lung. The right lateral thorax sick edge is clipped off the edge of the film. No right-sided pneumothorax or large effusion. Critical Value/emergent  results were called by telephone at the time of interpretation on 07/19/2023 at 11:21 am to nurse Toni Amend, who verbally acknowledged these results. IMPRESSION: New left subclavian line with tip overlying the upper mediastinum. The exact tip location is uncertain and there is increasing left pleural effusion and lung opacity. There is also some shift of the mediastinum from right to left. Component of volume loss is possible. Please correlate with blood return and additional workup for catheter location. Advancement of the Swan-Ganz catheter now with tip along the right pulmonary artery. Electronically Signed   By: Karen Kays M.D.   On: 07/19/2023 11:46   DG CHEST PORT 1 VIEW Result Date: 07/19/2023 CLINICAL DATA:  Shortness of breath. EXAM: PORTABLE  CHEST 1 VIEW COMPARISON:  X-ray 07/19/2023 at 9:36 a.m. FINDINGS: Interval retraction of the Swan-Ganz catheter via the right IJ now with tip along the main pulmonary artery. Stable ET tube, enteric tube, left IJ catheter and ECMO catheter. Enlarged heart with vascular congestion and some edema. Persistent left retrocardiac opacity and effusion. No pneumothorax. Overlapping cardiac leads. IMPRESSION: Interval retraction of the Swan-Ganz catheter with tip overlying the main pulmonary artery. Electronically Signed   By: Karen Kays M.D.   On: 07/19/2023 11:24   DG CHEST PORT 1 VIEW Result Date: 07/19/2023 CLINICAL DATA:  Shortness of breath EXAM: PORTABLE CHEST 1 VIEW COMPARISON:  X-ray 07/18/2023 and older FINDINGS: Stable ET tube, enteric tube. Right IJ Swan-Ganz catheter with tip in the right lung hilum, somewhat more peripheral than usually seen and advanced from previous. Left IJ line with tip along the central SVC. Separate right subclavian presumed ECMO catheter. Enlarged heart with vascular congestion and trace edema. Confluent left lung base opacity with left effusion. No pneumothorax. Overlapping cardiac leads. Density in the upper abdomen on the left. This  could be contrast in the stomach or other process. IMPRESSION: Mass with the Swan-Ganz catheter further into the right lung hilum of the upper lobe. Numerous other tubes and lines again seen. Enlarged heart with vascular congestion and some developing edema. Persistent left lung base opacity with increasing left pleural effusion. Electronically Signed   By: Karen Kays M.D.   On: 07/19/2023 11:23   HYBRID OR IMAGING ([facility] ONLY) Result Date: 07/18/2023 There is no interpretation for this exam.  This order is for images obtained during a surgical procedure.  Please See "Surgeries" Tab for more information regarding the procedure.   ECHOCARDIOGRAM LIMITED Result Date: 07/18/2023    ECHOCARDIOGRAM LIMITED REPORT   Patient Name:   Aaron [name] Date of Exam: 07/18/2023 Medical Rec #:  [id number]    Height:       76.0 in Accession #:    [id number]   Weight:       174.4 lb Date of Birth:  1986-03-20    BSA:          2.090 m Patient Age:    37 years     BP:           119/58 mmHg Patient Gender: M            HR:           67 bpm. Exam Location:  Inpatient Procedure: Limited Echo (Both Spectral and Color Flow Doppler were utilized            during procedure). Indications:    ECMO  History:        Patient has prior history of Echocardiogram examinations, most                 recent 07/17/2023.  Sonographer:    Karma Ganja Referring Phys: [id number] ADITYA SABHARWAL IMPRESSIONS  1. Impella device noted 5 cm distal to the aortic annulus No significant change in RV/LV size/functoin with ECMO flow clamped. Left ventricular ejection fraction, by estimation, is 25%. The left ventricle has moderately decreased function. The left ventricle demonstrates global hypokinesis. The left ventricular internal cavity size was moderately dilated.  2. Right ventricular systolic function is moderately reduced. The right ventricular size is moderately enlarged.  3. Post mTEER with 3 XTW clips no color flow/doppler perfomred . The mitral valve has been  repaired/replaced. FINDINGS  Left Ventricle: Impella device noted 5 cm  distal to the aortic annulus No significant change in RV/LV size/functoin with ECMO flow clamped. Left ventricular ejection fraction, by estimation, is 25%. The left ventricle has moderately decreased function. The left ventricle demonstrates global hypokinesis. The left ventricular internal cavity size was moderately dilated. Right Ventricle: The right ventricular size is moderately enlarged. Right ventricular systolic function is moderately reduced. Pericardium: There is no evidence of pericardial effusion. Mitral Valve: Post mTEER with 3 XTW clips no color flow/doppler perfomred. The mitral valve has been repaired/replaced. LEFT VENTRICLE PLAX 2D LVIDd:         0.09 cm LV IVS:        9 mm  Charlton Haws MD Electronically signed by Charlton Haws MD Signature Date/Time: 07/18/2023/12:50:15 PM    Final    DG CHEST PORT 1 VIEW Result Date: 07/18/2023 CLINICAL DATA:  ECMO. EXAM: PORTABLE CHEST 1 VIEW COMPARISON:  July 17, 2023. FINDINGS: Stable cardiomegaly. Impella device is unchanged in position. Endotracheal nasogastric tubes are unchanged. Right internal jugular Swan-Ganz catheter is unchanged. Right lung is clear. Mild left basilar atelectasis is noted with possible effusion, although left lung base is not completely included in field-of-view. IMPRESSION: Stable support apparatus. Probable mild left basilar atelectasis is noted with small pleural effusion, although left lung base is not completely included in field-of-view. Electronically Signed   By: Lupita Raider M.D.   On: 07/18/2023 09:59   ECHOCARDIOGRAM COMPLETE Result Date: 07/17/2023    ECHOCARDIOGRAM REPORT   Patient Name:   Aaron [name] Date of Exam: 07/17/2023 Medical Rec #:  [id number]    Height:       76.0 in Accession #:    [id number]   Weight:       172.6 lb Date of Birth:  29-May-1985    BSA:          2.081 m Patient Age:    37 years     BP:           101/68 mmHg Patient Gender:  M            HR:           66 bpm. Exam Location:  Inpatient Procedure: 2D Echo, Cardiac Doppler, Color Doppler and Intracardiac            Opacification Agent (Both Spectral and Color Flow Doppler were            utilized during procedure). Indications:    S/P mitral valve repair  History:        Patient has prior history of Echocardiogram examinations, most                 recent 03/20/2023. Arrythmias:Atrial Flutter,                 Signs/Symptoms:Chest Pain; Risk Factors:Hypertension. Cardiac                 arrest, cardiogenic shock.  Sonographer:    Vern Claude Referring Phys: [id number] KATHRYN R THOMPSON IMPRESSIONS  1. Impella Canula appears reasonably positioned. ECMO support not well assessed in this study. Left ventricular ejection fraction, by estimation, is <20%. The left ventricle has severely decreased function. The left ventricle demonstrates global hypokinesis. There is mild left ventricular hypertrophy. Left ventricular diastolic parameters are indeterminate.  2. TAPSE is now normal, Basal function has improved. Right ventricular systolic function is moderately reduced. The right ventricular size is severely enlarged.  3. Large pleural effusion.  4. There are three MitraClip  XTWs placed centrally. Trivial regurgitation. Mean gradient 1 mm Hg. No SLD. The mitral valve has been repaired/replaced. Trivial mitral valve regurgitation. No evidence of mitral stenosis. The mean mitral valve gradient is 1.0 mmHg.  5. The aortic valve is tricuspid. Aortic valve regurgitation is not visualized. No aortic stenosis is present.  6. The inferior vena cava is normal in size with greater than 50% respiratory variability, suggesting right atrial pressure of 3 mmHg.  7. Evidence of atrial level shunting detected by color flow Doppler. Comparison(s): Prior images reviewed side by side. LV is less dilated. Successful mTEER. RV function has improved from prior imaging. FINDINGS  Left Ventricle: Impella Canula appears  reasonably positioned. ECMO support not well assessed in this study. Left ventricular ejection fraction, by estimation, is <20%. The left ventricle has severely decreased function. The left ventricle demonstrates global hypokinesis. Strain was performed and the global longitudinal strain is indeterminate. The left ventricular internal cavity size was normal in size. There is mild left ventricular hypertrophy. Left ventricular diastolic parameters are indeterminate. Right Ventricle: TAPSE is now normal, Basal function has improved. The right ventricular size is severely enlarged. No increase in right ventricular wall thickness. Right ventricular systolic function is moderately reduced. Left Atrium: Left atrial size was normal in size. Right Atrium: Right atrial size was normal in size. Pericardium: There is no evidence of pericardial effusion. Mitral Valve: There are three MitraClip XTWs placed centrally. Trivial regurgitation. Mean gradient 1 mm Hg. No SLD. The mitral valve has been repaired/replaced. Trivial mitral valve regurgitation. No evidence of mitral valve stenosis. MV peak gradient, 2.5 mmHg. The mean mitral valve gradient is 1.0 mmHg. Tricuspid Valve: The tricuspid valve is normal in structure. Tricuspid valve regurgitation is trivial. No evidence of tricuspid stenosis. Aortic Valve: The aortic valve is tricuspid. Aortic valve regurgitation is not visualized. No aortic stenosis is present. Aortic valve mean gradient measures 0.0 mmHg. Aortic valve peak gradient measures 1.1 mmHg. Aortic valve area, by VTI measures 3.15 cm. Pulmonic Valve: The pulmonic valve was normal in structure. Pulmonic valve regurgitation is not visualized. No evidence of pulmonic stenosis. Aorta: The aortic root and ascending aorta are structurally normal, with no evidence of dilitation. Venous: The inferior vena cava is normal in size with greater than 50% respiratory variability, suggesting right atrial pressure of 3 mmHg.  IAS/Shunts: Evidence of atrial level shunting detected by color flow Doppler. Additional Comments: 3D was performed not requiring image post processing on an independent workstation and was indeterminate. There is a large pleural effusion.  LEFT VENTRICLE PLAX 2D LVIDd:         4.30 cm      Diastology LVIDs:         3.80 cm      LV e' medial:    6.83 cm/s LV PW:         1.20 cm      LV E/e' medial:  8.5 LV IVS:        1.10 cm      LV e' lateral:   9.75 cm/s LVOT diam:     2.20 cm      LV E/e' lateral: 5.9 LV SV:         24 LV SV Index:   11 LVOT Area:     3.80 cm  LV Volumes (MOD) LV vol d, MOD A4C: 163.0 ml LV vol s, MOD A4C: 111.0 ml LV SV MOD A4C:     163.0 ml RIGHT VENTRICLE  IVC RV Basal diam:  5.10 cm    IVC diam: 1.40 cm RV Mid diam:    3.90 cm RV S prime:     6.76 cm/s TAPSE (M-mode): 1.5 cm LEFT ATRIUM           Index        RIGHT ATRIUM           Index LA diam:      2.70 cm 1.30 cm/m   RA Area:     17.60 cm LA Vol (A2C): 53.5 ml 25.71 ml/m  RA Volume:   51.20 ml  24.61 ml/m LA Vol (A4C): 22.2 ml 10.67 ml/m  AORTIC VALVE                    PULMONIC VALVE AV Area (Vmax):    3.46 cm     PV Vmax:       0.61 m/s AV Area (Vmean):   3.41 cm     PV Peak grad:  1.5 mmHg AV Area (VTI):     3.15 cm AV Vmax:           53.50 cm/s AV Vmean:          28.300 cm/s AV VTI:            0.075 m AV Peak Grad:      1.1 mmHg AV Mean Grad:      0.0 mmHg LVOT Vmax:         48.70 cm/s LVOT Vmean:        25.400 cm/s LVOT VTI:          0.062 m LVOT/AV VTI ratio: 0.83  AORTA Ao Root diam: 3.50 cm Ao Asc diam:  3.00 cm MITRAL VALVE MV Area (PHT): 1.99 cm    SHUNTS MV Area VTI:   0.90 cm    Systemic VTI:  0.06 m MV Peak grad:  2.5 mmHg    Systemic Diam: 2.20 cm MV Mean grad:  1.0 mmHg MV Vmax:       0.79 m/s MV Vmean:      55.6 cm/s MV Decel Time: 382 msec MV E velocity: 57.90 cm/s MV A velocity: 91.20 cm/s MV E/A ratio:  0.63 Riley Lam MD Electronically signed by Riley Lam MD Signature  Date/Time: 07/17/2023/10:52:00 AM    Final    DG Abd 1 View Result Date: 07/17/2023 CLINICAL DATA:  OG tube placement EXAM: ABDOMEN - 1 VIEW COMPARISON:  07/16/2023, 07/15/2023, 07/13/2023 FINDINGS: Enteric tube tip and side port overlie the proximal stomach. ECMO catheter tip overlies the intra hepatic IVC. Radiopaque material overlying left upper quadrant is unchanged. IMPRESSION: 1. Enteric tube tip and side port overlie the proximal stomach. 2. Persistent left retrocardiac opacity Electronically Signed   By: Jasmine Pang M.D.   On: 07/17/2023 01:37   DG CHEST PORT 1 VIEW Result Date: 07/17/2023 CLINICAL DATA:  OG tube placement EXAM: PORTABLE CHEST 1 VIEW COMPARISON:  07/16/2023 FINDINGS: Endotracheal tube tip is about 4.4 cm superior to the carina. Left IJ central venous catheter tip at the cavoatrial junction. Right IJ Swan-Ganz catheter tip at the right pulmonary artery. Impella device similar in position. Enteric tube tip below the diaphragm but incompletely visualized. Cardiomegaly. Persistent consolidation at the left lung base. IMPRESSION: 1. Support lines and tubes as above. 2. Cardiomegaly with persistent airspace duct stones at the left base. Electronically Signed   By: Jasmine Pang M.D.   On: 07/17/2023 01:35   DG  Abd 1 View Result Date: 07/16/2023 CLINICAL DATA:  Orogastric tube placement. EXAM: ABDOMEN - 1 VIEW COMPARISON:  None Available. FINDINGS: Tip of the enteric tube below the diaphragm in the stomach, the side port is at the level of the gastroesophageal junction. Advancement of least 4 cm is recommended to place the side-port below the diaphragm. ECMO catheter from an inferior approach. Swan-Ganz catheter and intra-aortic balloon pump partially included in the field of view. Retrocardiac opacity again seen. IMPRESSION: Tip of the enteric tube below the diaphragm in the stomach, the side port is at the level of the gastroesophageal junction. Advancement of at least 4 cm is recommended  to place the side-port below the diaphragm. Electronically Signed   By: Narda Rutherford M.D.   On: 07/16/2023 22:18   ECHO TEE Result Date: 07/16/2023    TRANSESOPHOGEAL ECHO REPORT   Patient Name:   Aaron [name] Date of Exam: 07/16/2023 Medical Rec #:  [id number]    Height:       76.0 in Accession #:    [id number]   Weight:       176.6 lb Date of Birth:  [birth year]    BSA:          2.101 m Patient Age:    37 years     BP:           93/82 mmHg Patient Gender: M            HR:           61 bpm. Exam Location:  Inpatient Procedure: Transesophageal Echo, 3D Echo, Cardiac Doppler and Color Doppler            (Both Spectral and Color Flow Doppler were utilized during            procedure). Indications:     MitraClip  History:         Patient has prior history of Echocardiogram examinations, most                  recent 07/14/2023. CHF, Acute MI; Mitral Valve Disease.  Sonographer:     Darlys Gales Referring Phys:  [id number] Wille Celeste THOMPSON Diagnosing Phys: Riley Lam MD PROCEDURE: After discussion of the risks and benefits of a TEE, an informed consent was obtained. The transesophogeal probe was passed without difficulty through the esophogus of the patient. Sedation performed by different physician. The patient developed no complications during the procedure.  IMPRESSIONS  1. ECMO and Impella support cannulas present. Settings change throughout the procedure for optimal loading. LV decreased in size from start to end of case. Left ventricular ejection fraction, by estimation, is <20%. The left ventricle has severely decreased function. The left ventricular internal cavity size was severely dilated.  2. Right ventricular systolic function is severely reduced. The right ventricular size is moderately enlarged.  3. Left atrial size was severely dilated. No left atrial/left atrial appendage thrombus was detected.  4. Right atrial size was mild to moderately dilated.  5. Moderate pericardial effusion. The  pericardial effusion is posterior to the left ventricle.  6. Prior to procedure- severe mitral valve regurgitation. 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 PV flow reversal. Mean gradient 1 mm Hg with no evidence of mitral stenosis.     During the procedure, three XTW Mitra Clips were placed. First A-2/P2, second lateral to this (slightly unparallel due to being stuck in the P2 apparatus, third lateral  to this. Mitral regurgitation become moderate, mild, then trivial.     After procedure, a mean gradient of 2 mm HG was present. Slight increase in pericardial effusion size without any evidence of tamponade. Residual shunting all left to right. Patient tolerated the procedure well with no immediate complications. The mitral valve is abnormal. Trivial mitral valve regurgitation. No evidence of mitral stenosis.  7. The aortic valve is tricuspid. Aortic valve regurgitation is trivial. No aortic stenosis is present.  8. 3D performed of the mitral valve and demonstrates 3D MPR used to line up 3 MitraClips.  9. Evidence of atrial level shunting detected by color flow Doppler. FINDINGS  Left Ventricle: ECMO and Impella support cannulas present. Settings change throughout the procedure for optimal loading. LV decreased in size from start to end of case. Left ventricular ejection fraction, by estimation, is <20%. The left ventricle has severely decreased function. The left ventricular internal cavity size was severely dilated. Right Ventricle: The right ventricular size is moderately enlarged. No increase in right ventricular wall thickness. Right ventricular systolic function is severely reduced. Left Atrium: Left atrial size was severely dilated. No left atrial/left atrial appendage thrombus was detected. Right Atrium: Right atrial size was mild to moderately dilated. Pericardium: A moderately sized pericardial effusion is present. The pericardial effusion  is posterior to the left ventricle. Mitral Valve: Prior to procedure- severe mitral valve regurgitation. 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 PV flow reversal. Mean gradient 1 mm Hg with no evidence of mitral stenosis. During the procedure, three XTW Mitra Clips were placed. First A-2/P2, second lateral to this (slightly unparallel due to being stuck in the P2 apparatus, third lateral to this. Mitral regurgitation become moderate, mild, then trivial. After procedure, a mean gradient of 2 mm HG was present. Slight increase in pericardial effusion size without any evidence of tamponade. Residual shunting all left to right. Patient tolerated the procedure well with no immediate complications. The mitral  valve is abnormal. Trivial mitral valve regurgitation. No evidence of mitral valve stenosis. MV peak gradient, 3.4 mmHg. The mean mitral valve gradient is 1.6 mmHg. Tricuspid Valve: The tricuspid valve is normal in structure. Tricuspid valve regurgitation is mild . No evidence of tricuspid stenosis. Aortic Valve: The aortic valve is tricuspid. Aortic valve regurgitation is trivial. No aortic stenosis is present. Pulmonic Valve: The pulmonic valve was normal in structure. Pulmonic valve regurgitation is trivial. Aorta: The aortic root, ascending aorta, aortic arch and descending aorta are all structurally normal, with no evidence of dilitation or obstruction. IAS/Shunts: Evidence of atrial level shunting detected by color flow Doppler. Additional Comments: 3D was performed not requiring image post processing on an independent workstation and was indeterminate. MITRAL VALVE MV Peak grad: 3.4 mmHg MV Mean grad: 1.6 mmHg MV Vmax:      0.93 m/s MV Vmean:     59.5 cm/s Riley Lam MD Electronically signed by Riley Lam MD Signature Date/Time: 07/16/2023/4:05:02 PM    Final    EP STUDY Result Date: 07/16/2023 See surgical note for  result.  Structural Heart Procedure Result Date: 07/16/2023 See surgical note for result.  DG CHEST PORT 1 VIEW Result Date: 07/16/2023 CLINICAL DATA:  ECMO.  Bilateral pulmonary embolism. EXAM: PORTABLE CHEST 1 VIEW COMPARISON:  One-view chest x-ray 07/15/2023. FINDINGS: The heart is mildly enlarged. Endotracheal tube is stable. Left IJ line is stable. Swan-Ganz catheter is stable terminating at the right main pulmonary outflow tract. Impella device  is stable. ECMO catheter is present in the inferior IVC. Mild pulmonary vascular congestion is stable. A left pleural effusion is suspected. IMPRESSION: 1. Stable support apparatus. 2. Stable mild cardiomegaly and pulmonary vascular congestion. 3. Suspect left pleural effusion. Electronically Signed   By: Marin Roberts M.D.   On: 07/16/2023 09:48   DG Chest 1 View Result Date: 07/15/2023 CLINICAL DATA:  ECMO. EXAM: CHEST  1 VIEW portable COMPARISON:  07/14/2023 FINDINGS: Stable ET tube, enteric tube. Left IJ catheter tip along the SVC right atrial junction region. Right IJ Swan-Ganz catheter with tip overlying the right pulmonary artery. Separate ECMO catheter extending overlying the cardiac shadow. The heart is enlarged. There appear to be enlarged pulmonary arteries as well with vascular congestion. Persistent left retrocardiac opacity. Decreasing right lung base opacity. No pneumothorax. Overlapping cardiac leads. IMPRESSION: Numerous tubes and lines are stable. Decreasing right lung base opacity. Electronically Signed   By: Karen Kays M.D.   On: 07/15/2023 10:12   DG CHEST PORT 1 VIEW Result Date: 07/14/2023 CLINICAL DATA:  ECMO chest pain EXAM: PORTABLE CHEST 1 VIEW COMPARISON:  07/14/2023, CT 07/10/2023, chest x-ray 07/13/2023, 07/12/2023, 07/11/2023 FINDINGS: Endotracheal tube tip is about 4.6 cm superior to the carina. Enteric tube tip below the diaphragm but incompletely visualized. Right IJ Swan-Ganz catheter tip over the right pulmonary  artery. Left IJ central venous catheter tip at the SVC. Left ventricular assist device with the tip overlying the left ventricular region. Cardiac enlargement as before. Partially visualized ECMO catheter tip overlying the intra hepatic IVC, about 2.7 cm caudal to the right atrial IVC junction. Persistent left lung base consolidation. Vascular congestion and hazy right base airspace disease IMPRESSION: 1. Support lines and tubes as above 2. Cardiomegaly with vascular congestion and probable pleural effusions. No significant change in dense left lung base consolidation and hazy airspace disease at the right base. Electronically Signed   By: Jasmine Pang M.D.   On: 07/14/2023 20:19   DG C-Arm 1-60 Min-No Report Result Date: 07/14/2023 Fluoroscopy was utilized by the requesting physician.  No radiographic interpretation.   DG C-Arm 1-60 Min-No Report Result Date: 07/14/2023 Fluoroscopy was utilized by the requesting physician.  No radiographic interpretation.   DG CHEST PORT 1 VIEW Result Date: 07/14/2023 CLINICAL DATA:  Cardiogenic shock.  On ECMO. EXAM: PORTABLE CHEST 1 VIEW COMPARISON:  Chest x-ray from yesterday. FINDINGS: Unchanged endotracheal tube, enteric tube, right internal jugular Swan-Ganz catheter, left internal jugular central venous catheter, and Impella device. Unchanged ECMO cannula near the inferior cavoatrial junction. Stable cardiomegaly. Hazy airspace opacities throughout the left lung are not significantly changed. Similar left-greater-than-right lower lobe atelectasis. No pneumothorax. No acute osseous abnormality. IMPRESSION: 1. Unchanged support apparatus. 2. Unchanged asymmetric pulmonary edema and left-greater-than-right lower lobe atelectasis. Electronically Signed   By: Obie Dredge M.D.   On: 07/14/2023 09:13   DG CHEST PORT 1 VIEW Result Date: 07/13/2023 CLINICAL DATA:  Patient receiving ECMO. EXAM: PORTABLE CHEST 1 VIEW COMPARISON:  Chest radiograph from the prior day.  FINDINGS: An endotracheal tube terminates in the midthoracic trachea. An Impella device appears unchanged in position. A right internal jugular swans Ganz catheter tip overlies the right pulmonary artery. A left internal jugular central venous catheter tip overlies the superior vena cava. A vascular catheter overlies the inferior cavoatrial junction. An enteric tube enters the stomach and terminates below the field of view. The heart is enlarged. Bilateral layering pleural effusions with associated atelectasis/airspace disease appears similar to prior exam. IMPRESSION:  Bilateral layering pleural effusions with associated atelectasis/airspace disease appears similar to prior exam. Electronically Signed   By: Romona Curls M.D.   On: 07/13/2023 11:32   DG CHEST PORT 1 VIEW Result Date: 07/12/2023 CLINICAL DATA:  Check endotracheal tube placement EXAM: PORTABLE CHEST 1 VIEW COMPARISON:  Film from earlier in the same day. FINDINGS: Endotracheal tube and gastric catheter are noted in satisfactory position. Impella catheter is seen extending into the left ventricle. Swan-Ganz catheter is noted within the right pulmonary artery. Left jugular central venous line is noted at the cavoatrial junction. No pneumothorax is seen. Persistent bibasilar opacities are seen. No other focal abnormality is noted. IMPRESSION: Tubes and lines in satisfactory position. Bibasilar opacities are again seen stable from the recent exam. Electronically Signed   By: Alcide Clever M.D.   On: 07/12/2023 19:25   DG CHEST PORT 1 VIEW Result Date: 07/12/2023 CLINICAL DATA:  ECMO patient.  History of pulmonary embolism. EXAM: PORTABLE CHEST 1 VIEW COMPARISON:  Radiographs 07/11/2023 and 07/10/2023.  CT 07/10/2023. FINDINGS: 0542 hours. Two views submitted. Unchanged position of the support system. Tip of the endotracheal tube overlies the mid trachea. Right IJ Swan-Ganz catheter projects over the proximal right pulmonary artery. Enteric tube projects  below the diaphragm, tip not visualized. Left IJ central venous catheter extends to the level of the superior cavoatrial junction. The Impella device and ECMO catheter appear unchanged. The heart size and mediastinal contours are stable. The overall pulmonary aeration has improved with residual left greater than right basilar airspace opacities and probable small pleural effusions. No evidence of pneumothorax. The bones appear unchanged. IMPRESSION: 1. Improved pulmonary aeration with residual left greater than right basilar airspace opacities and probable small pleural effusions. 2. Stable support system. Electronically Signed   By: Carey Bullocks M.D.   On: 07/12/2023 09:49   VAS [place] LOWER EXTREMITY ARTERIAL DUPLEX Result Date: 07/11/2023 LOWER EXTREMITY ARTERIAL DUPLEX STUDY Patient Name:  Aaron [name]  Date of Exam:   07/11/2023 Medical Rec #: [id number]     Accession #:    [id number] Date of Birth: 1986/01/25     Patient Gender: M Patient Age:   79 years Exam Location:  [facility] Procedure:      VAS [place] LOWER EXTREMITY ARTERIAL DUPLEX Referring Phys: Clearnce Hasten --------------------------------------------------------------------------------  Indications: Peripheral artery disease.  Vascular Interventions: ECMO cannulation 07/10/23. Current ABI:            N/A Limitations: ECMO Comparison Study: None. Performing Technologist: Shona Simpson  Examination Guidelines: A complete evaluation includes B-mode imaging, spectral Doppler, color Doppler, and power Doppler as needed of all accessible portions of each vessel. Bilateral testing is considered an integral part of a complete examination. Limited examinations for reoccurring indications may be performed as noted.  +----------+--------+-----+--------+----------+--------+ RIGHT     PSV cm/sRatioStenosisWaveform  Comments +----------+--------+-----+--------+----------+--------+ DFA       85                   monophasic          +----------+--------+-----+--------+----------+--------+ SFA Prox  48                   monophasic         +----------+--------+-----+--------+----------+--------+ SFA Mid   73                   monophasic         +----------+--------+-----+--------+----------+--------+ SFA Distal44  monophasic         +----------+--------+-----+--------+----------+--------+ POP Prox  51                   monophasic         +----------+--------+-----+--------+----------+--------+ POP Distal40                   monophasic         +----------+--------+-----+--------+----------+--------+ ATA Distal25                   monophasic         +----------+--------+-----+--------+----------+--------+ PTA Distal24                   monophasic         +----------+--------+-----+--------+----------+--------+ PERO Mid  31                   monophasic         +----------+--------+-----+--------+----------+--------+  +----------+--------+-----+--------+----------+--------------+ LEFT      PSV cm/sRatioStenosisWaveform  Comments       +----------+--------+-----+--------+----------+--------------+ DFA                                      Not Visualized +----------+--------+-----+--------+----------+--------------+ SFA Prox  35                   monophasic               +----------+--------+-----+--------+----------+--------------+ SFA Mid   20                   monophasic               +----------+--------+-----+--------+----------+--------------+ SFA Distal20                   monophasic               +----------+--------+-----+--------+----------+--------------+ POP Prox  13                   monophasic               +----------+--------+-----+--------+----------+--------------+ POP Distal14                   monophasic               +----------+--------+-----+--------+----------+--------------+ ATA Distal11                    monophasic               +----------+--------+-----+--------+----------+--------------+ PTA Distal6                    monophasic               +----------+--------+-----+--------+----------+--------------+  Summary: See table(s) above for measurements and observations. Electronically signed by Coral Else MD on 07/11/2023 at 8:44:22 PM.    Final    VAS [place] LOWER EXTREMITY VENOUS (DVT) Result Date: 07/11/2023  Lower Venous DVT Study Patient Name:  Aaron [name]  Date of Exam:   07/11/2023 Medical Rec #: [id number]     Accession #:    [id number] Date of Birth: [birth year]     Patient Gender: M Patient Age:   75 years Exam Location:  [facility] Procedure:      VAS [place] LOWER EXTREMITY VENOUS (DVT) Referring Phys: Clearnce Hasten --------------------------------------------------------------------------------  Indications: Pulmonary embolism.  Risk Factors:  Confirmed PE Surgery ECMO cannulation 07/10/23. Limitations: Patient positioning & ECMO. Comparison Study: None. Performing Technologist: Shona Simpson  Examination Guidelines: A complete evaluation includes B-mode imaging, spectral Doppler, color Doppler, and power Doppler as needed of all accessible portions of each vessel. Bilateral testing is considered an integral part of a complete examination. Limited examinations for reoccurring indications may be performed as noted. The reflux portion of the exam is performed with the patient in reverse Trendelenburg.  +---------+---------------+---------+-----------+----------+--------------+ RIGHT    CompressibilityPhasicitySpontaneityPropertiesThrombus Aging +---------+---------------+---------+-----------+----------+--------------+ CFV                                                   Not Visualized +---------+---------------+---------+-----------+----------+--------------+ SFJ                                                   Not Visualized  +---------+---------------+---------+-----------+----------+--------------+ FV Prox  Full                                                        +---------+---------------+---------+-----------+----------+--------------+ FV Mid   Full                                                        +---------+---------------+---------+-----------+----------+--------------+ FV DistalFull                    Yes                                 +---------+---------------+---------+-----------+----------+--------------+ PFV      Full                                                        +---------+---------------+---------+-----------+----------+--------------+ POP      Full           Yes      Yes                                 +---------+---------------+---------+-----------+----------+--------------+ PTV      Full                                                        +---------+---------------+---------+-----------+----------+--------------+ PERO     Full                                                        +---------+---------------+---------+-----------+----------+--------------+   +---------+---------------+---------+-----------+----------+--------------+  LEFT     CompressibilityPhasicitySpontaneityPropertiesThrombus Aging +---------+---------------+---------+-----------+----------+--------------+ CFV                                                   Not Visualized +---------+---------------+---------+-----------+----------+--------------+ SFJ                                                   Not Visualized +---------+---------------+---------+-----------+----------+--------------+ FV Prox  Full                                                        +---------+---------------+---------+-----------+----------+--------------+ FV Mid   Full                                                         +---------+---------------+---------+-----------+----------+--------------+ FV DistalFull                    Yes                                 +---------+---------------+---------+-----------+----------+--------------+ PFV      Full                                                        +---------+---------------+---------+-----------+----------+--------------+ POP      Full           Yes      Yes                                 +---------+---------------+---------+-----------+----------+--------------+ PTV      Full                                                        +---------+---------------+---------+-----------+----------+--------------+ PERO                                                  Not Visualized +---------+---------------+---------+-----------+----------+--------------+     Summary: BILATERAL: - No evidence of deep vein thrombosis seen in the lower extremities, bilaterally. -No evidence of popliteal cyst, bilaterally.   *See table(s) above for measurements and observations. Electronically signed by Coral Else MD on 07/11/2023 at 8:40:51 PM.    Final    DG CHEST PORT 1 VIEW Result Date: 07/11/2023 CLINICAL DATA:  ECMO. EXAM: PORTABLE CHEST 1 VIEW COMPARISON:  Chest radiograph dated 07/11/2023. FINDINGS: Support line and tube in similar position. Interval lumen of an area of airspace opacity in the right infrahilar region, likely atelectasis. Pneumonia is not excluded. No large pleural effusion. No pneumothorax. Stable cardiac silhouette no acute osseous pathology. IMPRESSION: 1. Support line and tube in similar position. 2. Right infrahilar atelectasis. Electronically Signed   By: Elgie Collard M.D.   On: 07/11/2023 17:53   DG Chest Port 1 View in am Result Date: 07/11/2023 CLINICAL DATA:  [id number] On mechanically assisted ventilation [facility]) [id number] EXAM: PORTABLE CHEST 1 VIEW COMPARISON:  07/10/2023 FINDINGS: Endotracheal tube, enteric tube, left IJ  central line, right IJ pulmonary arterial catheter, and Impella device all remain in stable positioning compared to the previous exam. Stable cardiomegaly. Persistent retrocardiac opacity. Mild right basilar atelectasis. Hazy opacity in the left upper lobe. No pleural effusion or pneumothorax. IMPRESSION: 1. Stable support apparatus. 2. Similar bilateral airspace opacities including retrocardiac opacity and hazy opacity in the left upper lobe. Electronically Signed   By: Duanne Guess D.O.   On: 07/11/2023 10:44   EEG adult Result Date: 07/11/2023 Charlsie Quest, MD     07/11/2023 10:25 AM Patient Name: WETZEL MEESTER MRN: [id number] Epilepsy Attending: Charlsie Quest Referring Physician/Provider: Romie Minus, MD Date: 07/11/2023 Duration: 21.38 mins Patient history: 38yo M s/p cardia arrest. EEG to evaluate for seizure Level of alertness: comatose/ lethargic AEDs during EEG study: Versed Technical aspects: This EEG study was done with scalp electrodes positioned according to the 10-20 International system of electrode placement. Electrical activity was reviewed with band pass filter of 1-70Hz , sensitivity of 7 uV/mm, display speed of 56mm/sec with a 60Hz  notched filter applied as appropriate. EEG data were recorded continuously and digitally stored.  Video monitoring was available and reviewed as appropriate. Description: EEG showed continuous generalized 3 to 6 Hz theta-delta slowing admixed with 15 to 18 Hz beta activity distributed symmetrically and diffusely. Hyperventilation and photic stimulation were not performed.   ABNORMALITY - Continuous slow, generalized IMPRESSION: This study is suggestive of moderate diffuse encephalopathy likely related to sedation. No seizures or epileptiform discharges were seen throughout the recording. Priyanka Annabelle Harman   CT CHEST ABDOMEN PELVIS WO CONTRAST Result Date: 07/11/2023 CLINICAL DATA:  Patient on ECMO. Seizure. New onset. No history of trauma. Rule out  bleeding after TNK. EXAM: CT CHEST, ABDOMEN AND PELVIS WITHOUT CONTRAST TECHNIQUE: Multidetector CT imaging of the chest, abdomen and pelvis was performed following the standard protocol without IV contrast. RADIATION DOSE REDUCTION: This exam was performed according to the departmental dose-optimization program which includes automated exposure control, adjustment of the mA and/or kV according to patient size and/or use of iterative reconstruction technique. COMPARISON:  CT 10/10/2008 and CTA chest 07/09/2023 FINDINGS: CT CHEST FINDINGS Cardiovascular: Cardiomegaly. Small pericardial effusion. Left IJ CVC tip in the mid SVC. Right IJ Swan-Ganz catheter tip in the distal right pulmonary artery. ECMO cannula in the left ventricle and IVC. Mediastinum/Nodes: Endotracheal tube tip in the intrathoracic trachea. Enteric tube tip in the stomach. Hilar and mediastinal adenopathy was better evaluated on CT chest with contrast 07/09/2023. Lungs/Pleura: Near-complete consolidation within the left lower lobe with volume loss. The left lower lobe bronchus is occluded with debris. Consolidation and ground-glass opacities in the right lower lobe. Patchy bilateral ground-glass opacities greatest in the left upper lobe. These findings are significantly increased compared to 07/09/2023. No pleural effusion or pneumothorax. Musculoskeletal: No acute fracture. CT ABDOMEN PELVIS FINDINGS Hepatobiliary: Unremarkable noncontrast appearance  of the liver, gallbladder, and biliary tree. Pancreas: Mild haziness in the peripancreatic fat. No ductal dilation. No organized fluid collection. Spleen: Unremarkable. Adrenals/Urinary Tract: Normal adrenal glands. Nonspecific bilateral perinephric fluid/stranding, slightly greater on the right. No urinary calculi or hydronephrosis. Foley catheter in the nondistended bladder. Gas in the anti dependent bladder. Stomach/Bowel: Wall thickening about the duodenum with Peri duodenal fluid and stranding.  Decompressed transverse, descending, and sigmoid colon. No bowel obstruction. Normal appendix. Vascular/Lymphatic: Lines and tubes are described above. No lymphadenopathy. Reproductive: No acute abnormality. Other: Small volume free fluid in the pelvis and about the liver and spleen. No organized fluid collection or abscess. No free intraperitoneal air. Musculoskeletal: No acute fracture. IMPRESSION: 1. Significant increase in patchy ground-glass and consolidative opacities compared with 07/09/2023. This is nonspecific but given clinical concern this could represent alveolar hemorrhage. Differential considerations include aspiration and multifocal pneumonia. 2. Postobstructive atelectasis/pneumonia secondary to occlusion of the left lower lobe bronchus. 3. Wall thickening of the duodenum. Stranding and fluid about the duodenum and pancreas. It is unclear if this represents duodenitis or pancreatitis with reactive duodenitis. Correlate with lipase. 4. Nonspecific bilateral perinephric fluid/stranding, slightly greater on the right. Correlate with urinalysis to exclude infection. 5. Small volume free fluid in the abdomen and pelvis. No organized fluid collection or abscess. 6. Lines and tubes as described. Electronically Signed   By: Minerva Fester M.D.   On: 07/11/2023 02:14   CT HEAD WO CONTRAST ( ) Result Date: 07/11/2023 CLINICAL DATA:  , New onset seizure. No history of trauma. Rule out bleeding. EXAM: CT HEAD WITHOUT CONTRAST TECHNIQUE: Contiguous axial images were obtained from the base of the skull through the vertex without intravenous contrast. RADIATION DOSE REDUCTION: This exam was performed according to the departmental dose-optimization program which includes automated exposure control, adjustment of the mA and/or kV according to patient size and/or use of iterative reconstruction technique. COMPARISON:  CT head 08/16/2016 FINDINGS: Brain: No intracranial hemorrhage, mass effect, or evidence of  acute infarct. No hydrocephalus. No extra-axial fluid collection. Vascular: No hyperdense vessel or unexpected calcification. Skull: No fracture or focal lesion. Sinuses/Orbits: Mucosal thickening in the paranasal sinuses. No mastoid effusion. Other: Partially visualized enteric and endotracheal tubes. IMPRESSION: No acute intracranial abnormality. Electronically Signed   By: Minerva Fester M.D.   On: 07/11/2023 01:54   DG CHEST PORT 1 VIEW Result Date: 07/10/2023 CLINICAL DATA:  Patient receiving ECMO. EXAM: PORTABLE CHEST 1 VIEW COMPARISON:  07/10/2023 FINDINGS: Endotracheal tube with tip measuring 5.6 cm above the carina. Left central venous catheter, right Swan-Ganz catheter, and cardiac balloon pump are unchanged in position. Cardiac enlargement. Small left pleural effusion. Basilar atelectasis. Right lung is clear. IMPRESSION: Appliances remain unchanged in position. Small left pleural effusion with basilar atelectasis. Electronically Signed   By: Burman Nieves M.D.   On: 07/10/2023 22:21   DG Chest Port 1 View Result Date: 07/10/2023 CLINICAL DATA:  Mechanically assisted ventilation EXAM: PORTABLE CHEST 1 VIEW COMPARISON:  07/09/2023 FINDINGS: An endotracheal tube has been placed with tip measuring 5.9 cm above the carina. Left central venous catheter with tip projecting over the cavoatrial junction region. Right Swan-Ganz catheter with tip projecting over the right pulmonary artery. Cardiac balloon catheter tip projecting over the left ventricle. Cardiac enlargement. Probable small left pleural effusion. Lungs are grossly clear. No pneumothorax. IMPRESSION: Appliances appear in satisfactory position. Cardiac enlargement. Lungs are clear. Probable small left pleural effusion. Electronically Signed   By: Burman Nieves M.D.   On: 07/10/2023  19:36   ECHO TEE Result Date: 07/10/2023    TRANSESOPHOGEAL ECHO REPORT   Patient Name:   Aaron [name] Date of Exam: 07/10/2023 Medical Rec #:  [id number]     Height:       76.0 in Accession #:    [id number]   Weight:       215.0 lb Date of Birth:  1985-12-07    BSA:          2.284 m Patient Age:    37 years     BP:           0/0 mmHg Patient Gender: M            HR:           71 bpm. Exam Location:  Inpatient Procedure: Transesophageal Echo, Cardiac Doppler, Color Doppler and 3D Echo            (Both Spectral and Color Flow Doppler were utilized during            procedure). Indications:     Impella placement and ECMO  History:         Patient has prior history of Echocardiogram examinations, most                  recent 03/20/2023. Arrythmias:Cardiac Arrest;                  Signs/Symptoms:Chest Pain and Shortness of Breath.  Sonographer:     Delcie Roch RDCS Referring Phys:  [id number] Floreen Comber PATEL Diagnosing Phys: Arvilla Meres MD PROCEDURE: After discussion of the risks and benefits of a TEE, an informed consent was obtained from the patient. The patient was intubated. The transesophogeal probe was passed without difficulty through the esophogus of the patient. Imaged were obtained with the patient in a supine position. Sedation performed by different physician. The patient was monitored while under deep sedation. The patient developed no complications during the procedure.  IMPRESSIONS  1. Post ECMO images with significant decrease in biventricular function compared to pre-ECMO TTE. Left ventricular ejection fraction, by estimation, is <20%. The left ventricle has severely decreased function. The left ventricle demonstrates global hypokinesis. The left ventricular internal cavity size was moderately dilated.  2. Right ventricular systolic function is severely reduced. The right ventricular size is normal.  3. Left atrial size was moderately dilated. No left atrial/left atrial appendage thrombus was detected.  4. Right atrial size was moderately dilated.  5. The mitral valve is abnormal. Mild mitral valve regurgitation. There is mild prolapse of both leaflets  of the mitral valve.  6. + Impella catheter. The aortic valve is tricuspid. Aortic valve regurgitation is not visualized. No aortic stenosis is present.  7. 3D performed of the mitral valve and demonstrates see above. Conclusion(s)/Recommendation(s): Pre-ECMO TTE images attached at the beginning of this study show LVEF 35-40% with flail posterior MV leaflet and severe MR Post-ECMO images with results aa above. FINDINGS  Left Ventricle: Post ECMO images with significant decrease in biventricular function compared to pre-ECMO TTE. Left ventricular ejection fraction, by estimation, is <20%. The left ventricle has severely decreased function. The left ventricle demonstrates global hypokinesis. The left ventricular internal cavity size was moderately dilated. Right Ventricle: The right ventricular size is normal. No increase in right ventricular wall thickness. Right ventricular systolic function is severely reduced. Left Atrium: Left atrial size was moderately dilated. No left atrial/left atrial appendage thrombus was detected. Right Atrium: Right atrial size was moderately  dilated. Pericardium: There is no evidence of pericardial effusion. Mitral Valve: The mitral valve is abnormal. There is mild prolapse of both leaflets of the mitral valve. Mild mitral valve regurgitation. Tricuspid Valve: The tricuspid valve is normal in structure. Tricuspid valve regurgitation is mild. Aortic Valve: + Impella catheter. The aortic valve is tricuspid. Aortic valve regurgitation is not visualized. No aortic stenosis is present. Pulmonic Valve: The pulmonic valve was grossly normal. Pulmonic valve regurgitation is not visualized. Aorta: The aortic root is normal in size and structure. IAS/Shunts: No atrial level shunt detected by color flow Doppler. Additional Comments: 3D was performed not requiring image post processing on an independent workstation and was abnormal. AORTIC VALVE LVOT Vmax:   60.80 cm/s LVOT Vmean:  37.400 cm/s LVOT  VTI:    0.105 m  SHUNTS Systemic VTI: 0.10 m Arvilla Meres MD Electronically signed by Arvilla Meres MD Signature Date/Time: 07/10/2023/5:32:16 PM    Final    CARDIAC CATHETERIZATION Addendum Date: 07/10/2023 Successful VA ECMO cannulation and Impella CP placement as a vent Arvilla Meres, MD 5:09 PM   Result Date: 07/10/2023 Successful VA ECMO cannulation and Impella CP placement as a vent Arvilla Meres, MD 5:09 PM  CT Angio Chest PE W/Cm &/Or Wo Cm Addendum Date: 07/09/2023 ADDENDUM REPORT: 07/09/2023 22:12 ADDENDUM: These results were called by telephone at the time of interpretation on 07/09/2023 at 10:12 pm to provider Elayne Snare , who verbally acknowledged these results. Electronically Signed   By: Darliss Cheney M.D.   On: 07/09/2023 22:12   Result Date: 07/09/2023 CLINICAL DATA:  Positive D-dimer with left-sided chest pressure. EXAM: CT ANGIOGRAPHY CHEST WITH CONTRAST TECHNIQUE: Multidetector CT imaging of the chest was performed using the standard protocol during bolus administration of intravenous contrast. Multiplanar CT image reconstructions and MIPs were obtained to evaluate the vascular anatomy. RADIATION DOSE REDUCTION: This exam was performed according to the departmental dose-optimization program which includes automated exposure control, adjustment of the mA and/or kV according to patient size and/or use of iterative reconstruction technique. CONTRAST:  75mL OMNIPAQUE IOHEXOL 350 MG/ML SOLN COMPARISON:  CT angiogram chest 03/30/2022. FINDINGS: Cardiovascular: Heart is mildly enlarged. There is no pericardial effusion. Aorta is normal in size. There segmental bilateral lower lobe pulmonary emboli. Mediastinum/Nodes: There is an enlarged left hilar lymph node measuring up to 18 mm short axis, new from prior. Enlarged right hilar lymph node is new measuring 13 mm. Enlarged subcarinal lymph node measures up to 2 cm short axis, new from prior. There are numerous new nonenlarged  paratracheal lymph nodes. Visualized esophagus and thyroid gland are within normal limits. Lungs/Pleura: There some patchy ground-glass opacities in the inferior left lower lobe. There is no pleural effusion or pneumothorax. Upper Abdomen: No acute abnormality. Musculoskeletal: No chest wall abnormality. No acute or significant osseous findings. Review of the MIP images confirms the above findings. IMPRESSION: 1. Segmental bilateral lower lobe pulmonary emboli. No evidence for right heart strain. 2. Patchy ground-glass opacities in the inferior left lower lobe worrisome for infarct. 3. New mediastinal and bilateral hilar lymphadenopathy. Findings may be reactive, but neoplasm is not excluded. Electronically Signed: By: Darliss Cheney M.D. On: 07/09/2023 22:06   DG Chest Port 1 View Result Date: 07/09/2023 CLINICAL DATA:  Left-sided chest pain for several days, initial encounter EXAM: PORTABLE CHEST 1 VIEW COMPARISON:  03/20/2023 FINDINGS: Cardiac shadow is mildly prominent but accentuated by the portable technique. The lungs are well aerated bilaterally. No focal infiltrate or effusion is noted. No acute  bony abnormality is seen. IMPRESSION: No acute abnormality noted. Electronically Signed   By: Alcide Clever M.D.   On: 07/09/2023 19:48    Labs:  CBC: Recent Labs    08/04/23 0425 08/05/23 0500 08/06/23 0559 08/07/23 0415  WBC 7.3 5.9 6.5 5.4  HGB 9.2* 9.5* 10.6* 10.7*  HCT 28.2* 29.9* 33.1* 34.3*  PLT 407* 357 364 315    COAGS: Recent Labs    07/16/23 0403 07/16/23 1635 07/17/23 0115 07/17/23 1720 07/18/23 0351 07/18/23 1100 07/19/23 0358 07/20/23 1432 07/22/23 0412 07/23/23 0414 07/24/23 0412  INR 1.3*  --  1.3*  --  1.4*  --  1.2  --   --   --   --   APTT 57*   < > 62*   < > 75*   < >  --  73* 124* 143* 44*   < > = values in this interval not displayed.    BMP: Recent Labs    08/04/23 0425 08/05/23 0500 08/06/23 0559 08/07/23 0415  NA 130* 132* 133* 134*  K 5.5* 4.5  4.6 4.0  CL 95* 94* 96* 95*  CO2 22 23 20* 26  GLUCOSE 95 92 86 91  BUN 104* 65* 72* 36*  CALCIUM 7.9* 7.9* 8.4* 8.6*  CREATININE 10.47* 7.73* 8.12* 5.66*  GFRNONAA 6* 8* 8* 12*    LIVER FUNCTION TESTS: Recent Labs    07/31/23 0359 08/01/23 0418 08/03/23 0401 08/05/23 0500 08/06/23 0559 08/07/23 0415  BILITOT 0.7 0.8 0.9 0.7  --   --   AST 46* 111* 39 33  --   --   ALT 49* 128* 65* 47*  --   --   ALKPHOS 243* 494* 296* 211*  --   --   PROT 6.4* 6.0* 6.6 6.2*  --   --   ALBUMIN 2.2* 2.2* 2.5* 2.3* 2.6* 2.6*    TUMOR MARKERS: No results for input(s): "AFPTM", "CEA", "CA199", "CHROMGRNA" in the last 8760 hours.  Assessment and Plan: Acute kidney injury    Patient is a 38 y/o male with acute kidney injury.  Patient's hospital stay complicated by cardiac arrest x 2, ECMO requirement, s/p impella placement. Nephrology consulted IR to assist with a HD catheter placement and non tunneled dialysis catheter removal. Patient is NPO starting at midnight. He is currently on heparin due to a pulmonary embolism.   Risks and benefits discussed with the patient including, but not limited to bleeding, infection, vascular injury, pneumothorax which may require chest tube placement, air embolism or even death  All of the patient's questions were answered, patient is agreeable to proceed. Consent signed and in chart.   Thank you for this interesting consult.  I greatly enjoyed meeting PLEZ BELTON and look forward to participating in their care.  A copy of this report was sent to the requesting provider on this date.  Electronically Signed: Rosalita Levan, PA 08/07/2023, 4:26 PM   I spent a total of 20 Minutes    in face to face in clinical consultation, greater than 50% of which was counseling/coordinating care for acute kidney injury

## 2023-08-07 NOTE — Progress Notes (Signed)
 Physical Therapy Session Note  Patient Details  Name: Aaron [name] MRN: [id number] Date of Birth: 06/13/1985  Today's Date: 08/07/2023 PT Missed Time: 75 Minutes Missed Time Reason: Patient fatigue;Patient ill (Comment) (nausea/vomitting and fatigue 2/2 lack of sleep post late night dialysis)  Short Term Goals: Week 1:  PT Short Term Goal 1 (Week 1): Pt will ambulate 150 feet with LRAD and supervision PT Short Term Goal 2 (Week 1): pt will perform outcome measure assessment PT Short Term Goal 3 (Week 1): pt will perform stand pivot transfer with LRAD and supervision  Skilled Therapeutic Interventions/Progress Updates:    Pt missed 75 min 2/2 fatigue/nausea/vomitting and decreased sleep 2/2 to dialysis. Therapist will attempt to make up missed minutes as able.   Therapy Documentation Precautions:  Precautions Precautions: Fall Precaution/Restrictions Comments: Monitor vitals; HD MWF; Painful L ankle fx from 2010, mild L hemiparesis Restrictions Weight Bearing Restrictions Per Provider Order: No  Therapy/Group: Individual Therapy   [facility] [place], [place], DPT  08/07/2023, 9:11 AM

## 2023-08-07 NOTE — Progress Notes (Signed)
 Met with patient to review current situation, team conference and plan of care. Reviewed medications,and dietary recommendations. Patient is NPO at the moment for a procedure. Reviewed dialysis and access. Continue to follow along to provide educational needs to facilitate preparation for discharge.

## 2023-08-07 NOTE — Patient Care Conference (Signed)
 Inpatient RehabilitationTeam Conference and Plan of Care Update Date: 08/06/2023   Time: 1205 pm    Patient Name: HUNTER BACHAR      Medical Record Number: [id number]  Date of Birth: 12-13-1985 Sex: Male         Room/Bed: 4W11C/4W11C-01 Payor Info: Payor: [place] MEDICAID PREPAID HEALTH PLAN / Plan:  MEDICAID [facility] / Product Type: *No Product type* /    Admit Date/Time:  08/04/2023  4:27 PM  Primary Diagnosis:  Debility  Hospital Problems: Principal Problem:   Debility Active Problems:   Normocytic anemia   Nausea   Insomnia   Atrial fibrillation (HCC)   Malnutrition of moderate degree    Expected Discharge Date: Expected Discharge Date: 08/18/23  Team Members Present: Physician leading conference: Dr. Fanny Dance Social Worker Present: Dossie Der, LCSW Nurse Present: Konrad Dolores, RN PT Present: [name], PT OT Present: Candee Furbish, OT SLP Present: Everardo Pacific, SLP PPS Coordinator present : Fae Pippin, SLP     Current Status/Progress Goal Weekly Team Focus  Bowel/Bladder   Patient is continent of  bowel and bladder, last BM 3/25.   Remain continent.   Remain continent.    Swallow/Nutrition/ Hydration   regular/thin with chin tuck and intermittent throat clear   mod i  tolerance and use of strategies given minA    ADL's   Unable to progress with OOB ADL participation due to dizziness/nausea on eval. Min A for UB ADLs and Mod A for LB ADLs, all at bed-level. Barriers: Symptomatic positional changes, generalized debility, bilateral intential tremors. Unable to shower due to HD site?   Mod I. Will need BSC and TTB vs shower chair to be decided.   General conditioning, functional transfers, OOB tolerance, standing tolerance, energy conservation.    Mobility   bed mobility supervision, sit to stand/stand pivot transfer no AD and CGA/min A, gait x55 feet iwth no AD and min A wiht +2 for WC follow/IV management.   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    Communication                Safety/Cognition/ Behavioral Observations  modA   mod i   memory, problem solving    Pain   No reports of pain.   Patient will remain pain free.   Assess pain Q shift and PRN.    Skin   Skin is intact.  Stage 2 pressure ulcer on his buttocks Skin will remain intact.  Assess skin Q shift and PRN.      Discharge Planning:  Home with girlfriend who can be there if needed, his sister is able to assist also. 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 Patient is in hemodialysis. Patient limited by cognitive deficits, dizziness, nausea, symptomatic positional changes, generalized debility, bilateral tremors and muscle spasms in both hands.   Patient on target to meet rehab goals: yes, Patient requires Min A for upper body care and Mod Assist with lower body care all at bed-level. Patient requires supervision with transfers. Patient requires CGA with no assistive device to ambulate. Patient is able to walk  up  to 55 feet requiring min A +2 for WC follow/IV management. Overall goals are set  for Mod I assist at discharge.   *See Care Plan and progress notes for long and short-term goals.   Revisions to Treatment Plan:  Dietary follow up   Teaching Needs: Safety, medications, dietary modifications, transfers , toileting , etc.   Current Barriers to Discharge: Decreased caregiver support and Hemodialysis  Possible Resolutions to Barriers: Family Education     Medical Summary Current Status: Debility , insomnia, CHF, Afib, AKI on HD, nausea,  dysphagia  Barriers to Discharge: Cardiac Complications;Medical stability;Renal Insufficiency/Failure;Self-care education  Barriers to Discharge Comments: Debility , insomnia, CHF, Afib, AKI on HD, nausea, dysphagia Possible Resolutions to [employer] Focus: insomnia, continue monitor fluid status, HD, melatonin for sleep   Continued Need for Acute Rehabilitation Level of Care: The patient requires daily medical management by a physician with specialized training in physical medicine and rehabilitation for the following reasons: Direction of a multidisciplinary physical rehabilitation program to maximize functional independence : Yes Medical management of patient stability for increased activity during participation in an intensive rehabilitation regime.: Yes Analysis of laboratory values and/or radiology reports with any subsequent need for medication adjustment and/or medical intervention. : Yes   I attest that I was present, lead the team conference, and concur with the assessment and plan of the team.   Konrad Dolores [facility] 08/07/2023, 7:10 AM

## 2023-08-07 NOTE — Progress Notes (Addendum)
 Patient reports of N/V around 2120. No PRNs available. On call PA Delle Reining called and order for PO phenergan ordered. Patient unable to tolerate oral at this time, order changed to IV. Called pharmacy to see if Nurse could Y-site phenergan and heparin (running at 45ml/hr continuous), was informed they were not compatible. Educated the patient about incompatibility of two medications and stated that if he wanted to get IV medication we would have to place another IV, patient and family declined this option. Nurse called PA and she stated he can try oral or do suppository. Patient agreeable to try oral, new orders placed and patient given medication per [facility].

## 2023-08-08 ENCOUNTER — Inpatient Hospital Stay (HOSPITAL_COMMUNITY)

## 2023-08-08 DIAGNOSIS — N170 Acute kidney failure with tubular necrosis: Secondary | ICD-10-CM | POA: Diagnosis not present

## 2023-08-08 DIAGNOSIS — J9601 Acute respiratory failure with hypoxia: Secondary | ICD-10-CM | POA: Diagnosis not present

## 2023-08-08 DIAGNOSIS — I480 Paroxysmal atrial fibrillation: Secondary | ICD-10-CM

## 2023-08-08 DIAGNOSIS — R110 Nausea: Secondary | ICD-10-CM | POA: Diagnosis not present

## 2023-08-08 DIAGNOSIS — N179 Acute kidney failure, unspecified: Secondary | ICD-10-CM

## 2023-08-08 DIAGNOSIS — N186 End stage renal disease: Secondary | ICD-10-CM | POA: Diagnosis not present

## 2023-08-08 DIAGNOSIS — I342 Nonrheumatic mitral (valve) stenosis: Secondary | ICD-10-CM | POA: Diagnosis not present

## 2023-08-08 DIAGNOSIS — R5381 Other malaise: Secondary | ICD-10-CM | POA: Diagnosis not present

## 2023-08-08 DIAGNOSIS — I469 Cardiac arrest, cause unspecified: Secondary | ICD-10-CM | POA: Diagnosis not present

## 2023-08-08 DIAGNOSIS — R570 Cardiogenic shock: Secondary | ICD-10-CM | POA: Diagnosis not present

## 2023-08-08 DIAGNOSIS — D649 Anemia, unspecified: Secondary | ICD-10-CM | POA: Diagnosis not present

## 2023-08-08 DIAGNOSIS — G4700 Insomnia, unspecified: Secondary | ICD-10-CM | POA: Diagnosis not present

## 2023-08-08 DIAGNOSIS — R079 Chest pain, unspecified: Secondary | ICD-10-CM | POA: Diagnosis not present

## 2023-08-08 DIAGNOSIS — E875 Hyperkalemia: Secondary | ICD-10-CM | POA: Diagnosis not present

## 2023-08-08 DIAGNOSIS — E872 Acidosis, unspecified: Secondary | ICD-10-CM | POA: Diagnosis not present

## 2023-08-08 DIAGNOSIS — R0602 Shortness of breath: Secondary | ICD-10-CM | POA: Diagnosis not present

## 2023-08-08 DIAGNOSIS — I4891 Unspecified atrial fibrillation: Secondary | ICD-10-CM | POA: Diagnosis not present

## 2023-08-08 HISTORY — DX: Acute kidney failure, unspecified: N17.9

## 2023-08-08 HISTORY — PX: IR US GUIDE VASC ACCESS RIGHT: IMG2390

## 2023-08-08 HISTORY — PX: IR FLUORO GUIDE CV LINE RIGHT: IMG2283

## 2023-08-08 LAB — CBC
HCT: 33.4 % — ABNORMAL LOW (ref 39.0–52.0)
Hemoglobin: 10.5 g/dL — ABNORMAL LOW (ref 13.0–17.0)
MCH: 29.8 pg (ref 26.0–34.0)
MCHC: 31.4 g/dL (ref 30.0–36.0)
MCV: 94.9 fL (ref 80.0–100.0)
Platelets: 291 10*3/uL (ref 150–400)
RBC: 3.52 MIL/uL — ABNORMAL LOW (ref 4.22–5.81)
RDW: 15.5 % (ref 11.5–15.5)
WBC: 7.9 10*3/uL (ref 4.0–10.5)
nRBC: 0 % (ref 0.0–0.2)

## 2023-08-08 LAB — ECHOCARDIOGRAM COMPLETE
AR max vel: 4.39 cm2
AV Peak grad: 5.7 mmHg
Ao pk vel: 1.2 m/s
Area-P 1/2: 2.87 cm2
Height: 76 in
MV VTI: 1.63 cm2
S' Lateral: 3.6 cm
Weight: 3033.53 [oz_av]

## 2023-08-08 LAB — RENAL FUNCTION PANEL
Albumin: 2.6 g/dL — ABNORMAL LOW (ref 3.5–5.0)
Anion gap: 14 (ref 5–15)
BUN: 49 mg/dL — ABNORMAL HIGH (ref 6–20)
CO2: 23 mmol/L (ref 22–32)
Calcium: 8.5 mg/dL — ABNORMAL LOW (ref 8.9–10.3)
Chloride: 100 mmol/L (ref 98–111)
Creatinine, Ser: 6.21 mg/dL — ABNORMAL HIGH (ref 0.61–1.24)
GFR, Estimated: 11 mL/min — ABNORMAL LOW (ref >60)
Glucose, Bld: 96 mg/dL (ref 70–99)
Phosphorus: 7.2 mg/dL — ABNORMAL HIGH (ref 2.5–4.6)
Potassium: 4.3 mmol/L (ref 3.5–5.1)
Sodium: 137 mmol/L (ref 135–145)

## 2023-08-08 LAB — GLUCOSE, CAPILLARY
Glucose-Capillary: 94 mg/dL (ref 70–99)
Glucose-Capillary: 159 mg/dL — ABNORMAL HIGH (ref 70–99)
Glucose-Capillary: 159 mg/dL — ABNORMAL HIGH (ref 70–99)
Glucose-Capillary: 158 mg/dL — ABNORMAL HIGH (ref 70–99)

## 2023-08-08 LAB — HEPARIN LEVEL (UNFRACTIONATED): Heparin Unfractionated: 0.59 [IU]/mL (ref 0.30–0.70)

## 2023-08-08 MED ORDER — LIDOCAINE-EPINEPHRINE (PF) 1 %-1:200000 IJ SOLN
20.0000 mL | Freq: Once | INTRAMUSCULAR | Status: AC
  Administered 2023-08-08: 20 mL via INTRADERMAL

## 2023-08-08 MED ORDER — HEPARIN SODIUM (PORCINE) 1000 UNIT/ML IJ SOLN
INTRAMUSCULAR | Status: AC
  Filled 2023-08-08: qty 4

## 2023-08-08 MED ORDER — CEFAZOLIN SODIUM-DEXTROSE 2-4 GM/100ML-% IV SOLN
INTRAVENOUS | Status: AC | PRN
  Administered 2023-08-08: 2 g via INTRAVENOUS

## 2023-08-08 MED ORDER — HEPARIN SODIUM (PORCINE) 1000 UNIT/ML IJ SOLN
1000.0000 [IU] | Freq: Once | INTRAMUSCULAR | Status: AC
  Administered 2023-08-08: 1000 [IU] via INTRAVENOUS

## 2023-08-08 MED ORDER — MIDAZOLAM HCL 2 MG/2ML IJ SOLN
INTRAMUSCULAR | Status: AC | PRN
  Administered 2023-08-08: 1 mg via INTRAVENOUS

## 2023-08-08 MED ORDER — APIXABAN 5 MG PO TABS
5.0000 mg | ORAL_TABLET | Freq: Two times a day (BID) | ORAL | Status: DC
  Administered 2023-08-09 – 2023-08-14 (×10): 5 mg via ORAL
  Filled 2023-08-08 (×11): qty 1

## 2023-08-08 MED ORDER — FENTANYL CITRATE (PF) 100 MCG/2ML IJ SOLN
INTRAMUSCULAR | Status: AC | PRN
  Administered 2023-08-08: 25 ug via INTRAVENOUS

## 2023-08-08 MED ORDER — MIDAZOLAM HCL 2 MG/2ML IJ SOLN
INTRAMUSCULAR | Status: AC
  Filled 2023-08-08: qty 2

## 2023-08-08 MED ORDER — CEFAZOLIN SODIUM-DEXTROSE 2-4 GM/100ML-% IV SOLN
INTRAVENOUS | Status: AC
  Filled 2023-08-08: qty 100

## 2023-08-08 MED ORDER — HEPARIN SODIUM (PORCINE) 1000 UNIT/ML IJ SOLN
INTRAMUSCULAR | Status: AC
  Filled 2023-08-08: qty 10

## 2023-08-08 MED ORDER — FENTANYL CITRATE (PF) 100 MCG/2ML IJ SOLN
INTRAMUSCULAR | Status: AC
Start: 2023-08-08 — End: ?
  Filled 2023-08-08: qty 2

## 2023-08-08 MED ORDER — LIDOCAINE-EPINEPHRINE 1 %-1:100000 IJ SOLN
INTRAMUSCULAR | Status: AC
  Filled 2023-08-08: qty 1

## 2023-08-08 MED ORDER — HEPARIN SODIUM (PORCINE) 1000 UNIT/ML DIALYSIS
3800.0000 [IU] | INTRAMUSCULAR | Status: DC | PRN
  Administered 2023-08-08: 3800 [IU] via INTRAVENOUS_CENTRAL

## 2023-08-08 NOTE — Progress Notes (Signed)
 Patient ID: Aaron [name], male   DOB: [birth year], 38 y.o.   MRN: [id number] Discussed disability application and pt wanted worker to send referral to [facility] to being the process. Have emailed completed referral to [facility].

## 2023-08-08 NOTE — Progress Notes (Signed)
 Requested to see pt for out-pt HD needs at d/c. Met with pt at beside. Introduced self and explained role. Pt lives in [place] and prefers a clinic close to his home. Referral submitted to [facility] admissions for review. Pt states he will have transportation to HD appts. Pt's target d/c date has been set for April 7 per CSW. Will assist as needed.   Olivia Canter Renal Navigator [phone]

## 2023-08-08 NOTE — Progress Notes (Signed)
 [facility] NEPHROLOGY PROGRESS NOTE  Assessment/ Plan: Pt is a 38 y.o. yo male  likely ATN after cardiac arrest, cardiogenic shock on ECMO ( decan 3/8) and Impella ( removed 3/12), AHRF/VDRF.  # Dialysis dependent anuric AKI 2/2 ATN from cardiogenic shock on CRRT 07/11/23 - 07/26/23, Left subclavian temp HD cath placed by CCM; Transitioned to [facility] on 3/17.   -s/p RIJ TDC by IR on 08/08/23.  - Plan for HD on 3/28.   - remove temporary dialysis catheter   -consult renal navigator to look for outpatient HD for AKI.   # Cardiogenic Shock,  Impella removed; status post transcutaneous mitral valve repair; previously on ECMO. Cardiology supportive of transition to rehab.   # VT + PEA SCA, as per cardiology   # PE - on heparin per primary team  - we have consulted IR for tunneled catheter placement  - once his hemodialysis tunneled catheter is placed we can transition to eliquis per cardiology/medicine inpatient team previous plans   # Normocytic Anemia -  on aranesp - reduce dose to 100 mcg every Friday    # Hyperkalemia - on HD.  Changed to renal diet    # Severe MR s/p TEER 3/5  Subjective: Seen and examined.  Patient came back from IR procedure with getting tunneled HD catheter.  Denies nausea, vomiting, chest pain or shortness of breath.  No other new event. Objective Vital signs in last 24 hours: Vitals:   08/08/23 0845 08/08/23 0905 08/08/23 0910 08/08/23 0915  BP: 123/85 127/89 123/88 126/78  Pulse: 87 85 83 86  Resp: 20 20 (!) 23 20  Temp:      TempSrc:      SpO2: 100% 100% 100% 100%  Weight:      Height:       Weight change: -1.5 kg  Intake/Output Summary (Last 24 hours) at 08/08/2023 1100 Last data filed at 08/08/2023 0730 Gross per 24 hour  Intake 820 ml  Output 580 ml  Net 240 ml       Labs: RENAL PANEL Recent Labs  Lab 08/03/23 0401 08/04/23 0425 08/05/23 0500 08/06/23 0559 08/07/23 0415 08/08/23 0438  NA 130* 130* 132* 133* 134* 137  K  5.5* 5.5* 4.5 4.6 4.0 4.3  CL 94* 95* 94* 96* 95* 100  CO2 21* 22 23 20* 26 23  GLUCOSE 91 95 92 86 91 96  BUN 94* 104* 65* 72* 36* 49*  CREATININE 9.15* 10.47* 7.73* 8.12* 5.66* 6.21*  CALCIUM 8.2* 7.9* 7.9* 8.4* 8.6* 8.5*  MG 2.2  --   --   --   --   --   PHOS  --   --   --  7.7* 6.9* 7.2*  ALBUMIN 2.5*  --  2.3* 2.6* 2.6* 2.6*    Liver Function Tests: Recent Labs  Lab 08/03/23 0401 08/05/23 0500 08/06/23 0559 08/07/23 0415 08/08/23 0438  AST 39 33  --   --   --   ALT 65* 47*  --   --   --   ALKPHOS 296* 211*  --   --   --   BILITOT 0.9 0.7  --   --   --   PROT 6.6 6.2*  --   --   --   ALBUMIN 2.5* 2.3* 2.6* 2.6* 2.6*   No results for input(s): "LIPASE", "AMYLASE" in the last 168 hours. No results for input(s): "AMMONIA" in the last 168 hours. CBC: Recent Labs  08/04/23 0425 08/05/23 0500 08/06/23 0559 08/07/23 0415 08/08/23 0438  HGB 9.2* 9.5* 10.6* 10.7* 10.5*  MCV 92.2 95.2 92.5 94.8 94.9    Cardiac Enzymes: No results for input(s): "CKTOTAL", "CKMB", "CKMBINDEX", "TROPONINI" in the last 168 hours. CBG: Recent Labs  Lab 08/06/23 2042 08/07/23 0601 08/07/23 1145 08/07/23 2100 08/08/23 0611  GLUCAP 154* 77 83 119* 94    Iron Studies: No results for input(s): "IRON", "TIBC", "TRANSFERRIN", "FERRITIN" in the last 72 hours. Studies/Results: DG Abd 2 Views Result Date: 08/07/2023 CLINICAL DATA:  101717 Nausea 101717 EXAM: ABDOMEN - 2 VIEW COMPARISON:  07/31/2023 FINDINGS: The bowel gas pattern is normal. Enteric contrast is present within the colon and rectum. There is no evidence of free air. No radio-opaque calculi or other significant radiographic abnormality is seen. IMPRESSION: Negative. Electronically Signed   By: Duanne Guess D.O.   On: 08/07/2023 15:00    Medications: Infusions:  heparin 1,900 Units/hr (08/07/23 1930)    Scheduled Medications:  arformoterol  15 mcg Nebulization BID   ascorbic acid  250 mg Oral BID   aspirin  81 mg Oral  Daily   budesonide (PULMICORT) nebulizer solution  0.25 mg Nebulization BID   Chlorhexidine Gluconate Cloth  6 each Topical Q12H   Chlorhexidine Gluconate Cloth  6 each Topical Q0600   darbepoetin (ARANESP) injection - DIALYSIS  100 mcg Subcutaneous Q Fri-1800   feeding supplement (NEPRO CARB STEADY)  237 mL Oral Q24H   folic acid  1 mg Oral Daily   methimazole  10 mg Oral Daily   multivitamin  1 tablet Oral QHS   polyethylene glycol  17 g Oral Daily   revefenacin  175 mcg Nebulization Daily   sodium chloride flush  10-40 mL Intracatheter Q12H   thiamine  100 mg Oral Daily    have reviewed scheduled and prn medications.  Physical Exam: General:NAD, comfortable Heart:RRR, s1s2 nl Lungs:clear b/l, no crackle Abdomen:soft, Non-tender, non-distended Extremities:No edema Dialysis Access: Right IJ TDC in place, left-sided temporary HD catheter will be removed today.  Chandrika Sandles Prasad [place] 08/08/2023,11:00 AM  LOS: 4 days

## 2023-08-08 NOTE — Progress Notes (Signed)
 Speech Language Pathology Daily Session Note  Patient Details  Name: VENCENT HAUSCHILD MRN: [id number] Date of Birth: 09/08/1985  Today's Date: 08/08/2023 SLP Individual Time: 9562-1308 SLP Individual Time Calculation (min): 16 min  Short Term Goals: Week 1: SLP Short Term Goal 1 (Week 1): Patient will utilize chin tuck during consumption of thin liquids given min multimodal A SLP Short Term Goal 2 (Week 1): Patient will demonstrate problem solving abilities in functional situations given min multimodal A SLP Short Term Goal 3 (Week 1): Patient will recall and utilize memory compensatory strategies given min multimodal A  Skilled Therapeutic Interventions:   Pt seen for skilled SLP intervention to address cognitive goals. Pt completed visual safety awareness task x1 with >90% accuracy independently. Min cues needed to identify x1 remaining hazard. Pt able to verbally sequence typical day prior to hospitalization and discuss functional problem solving scenarios pertaining to his job. Missed 44 minutes due to nursing care and IR transport arrival to take pt to procedure. Continue SLP PoC.   Pain Pain Assessment Pain Scale: 0-10  Therapy/Group: Individual Therapy  Ellery Plunk 08/08/2023, 8:08 AM

## 2023-08-08 NOTE — Progress Notes (Signed)
 Occupational Therapy Note  Patient Details  Name: JEREMIAS BROYHILL MRN: [id number] Date of Birth: November 29, 1985  Today's Date: 08/08/2023 OT Missed Time: 60 Minutes Missed Time Reason: Unavailable (comment) (pt at radiology)  Pt missed 60 mins of group session as pt out of room at radiology.    Pollyann Glen [facility] 08/08/2023, 12:12 PM

## 2023-08-08 NOTE — Progress Notes (Signed)
 L subclavian HD cath removed. Site unremarkable. Manual pressure held x15 minutes. Pressure dressing applied. Pt instructed to remain flat in bed for 30 min. Keep dressing dry and intact for 24 hours. Contact nurse for bleeding/ swelling or pain at site. He and SO at bedside voiced understanding.

## 2023-08-08 NOTE — Procedures (Signed)
 Vascular and Interventional Radiology Procedure Note  Patient: Aaron [name] DOB: 1985/10/16 Medical Record Number: [id number] Note Date/Time: 08/08/23 9:21 AM   Performing Physician: Roanna Banning, MD Assistant(s): None  Diagnosis: ESRD requiring Hemodialysis  Procedure: TUNNELED HEMODIALYSIS CATHETER PLACEMENT  Anesthesia: Conscious Sedation Complications: None Estimated Blood Loss: Minimal Specimens:  None  Findings:  Successful placement of right-sided, 19 cm (tip-to-cuff), tunneled hemodialysis catheter with the tip of the catheter in the proximal right atrium.  Plan: Catheter ready for use.  See detailed procedure note with images in PACS. The patient tolerated the procedure well without incident or complication and was returned to Recovery in stable condition.    Roanna Banning, MD Vascular and Interventional Radiology Specialists [facility] Radiology   Pager. [phone] Clinic. [phone]

## 2023-08-08 NOTE — Progress Notes (Addendum)
 PHARMACY - ANTICOAGULATION CONSULT NOTE  Pharmacy Consult for Heparin > Eliquis  Indication: pulmonary embolus, s/p ECMO decannulation 3/7, Impella removal 3/12  No Known Allergies  Patient Measurements: Height: 6\' 4"  (193 cm) Weight: 86 kg (189 lb 9.5 oz) IBW/kg (Calculated) : 86.8 Heparin Dosing Weight: n/a  Vital Signs: Temp: 99.6 F (37.6 C) (03/28 1252) Temp Source: Oral (03/28 1252) BP: 114/77 (03/28 1309) Pulse Rate: 83 (03/28 1309)  Labs: Recent Labs    08/06/23 0559 08/07/23 0415 08/08/23 0438  HGB 10.6* 10.7* 10.5*  HCT 33.1* 34.3* 33.4*  PLT 364 315 291  HEPARINUNFRC 0.62 0.54 0.59  CREATININE 8.12* 5.66* 6.21*    Estimated Creatinine Clearance: 19.6 mL/min (A) (by C-G formula based on SCr of 6.21 mg/dL (H)).   Medical History: Past Medical History:  Diagnosis Date   Atrial fibrillation [facility])    Hyperthyroidism    Mitral regurgitation    NSTEMI (non-ST elevated myocardial infarction) [facility])     Assessment: 38 yo M with bilateral PE s/p TNK (2/27 @1156 ) now on [place] ECMO + Impella. Pharmacy consulted for bivalirudin for anticoagulation.   S/p impella CP >5.5 3/3 - bivalirudin was previously held with oozing at insertion site but restart 3/4. Patient underwent mitraclip 3/5.  Bival transitioned to heparin 3/7 after decannulation.  Impella 5.5 removed 3/13. Heparin restarted 3/13. Hep in CRRT stopped 3/15 with CRRT stopping.   3/27 AM: Heparin off for [facility] placement this morning.  Nephrology OK to remove temporary access today (IV team consulted).  OK to restart Eliquis the day after [facility] placed (PM dose) per protocol.  Goal of Therapy:  Heparin level 0.3-0.7 units/mL Monitor platelets by anticoagulation protocol: Yes   Plan:  Discontinue heparin consult START Eliquis 5 mg twice daily 3/29 PM  Trixie Rude, PharmD Clinical Pharmacist 08/08/2023  1:16 PM

## 2023-08-08 NOTE — Progress Notes (Signed)
 PROGRESS NOTE   Subjective/Complaints: TDC placed today,  temporary HD cath planned for removal today.  Pt feeling better today.  ROS: Patient denies fever, new vision changes,  diarrhea,  vomiting,  or chest pain, headache, or mood change. + shortness of breath with exertion not at rest + Nausea+ improved +chronic insomnia-improved Objective:   ECHOCARDIOGRAM COMPLETE Result Date: 08/08/2023    ECHOCARDIOGRAM REPORT   Patient Name:   Aaron [name] Date of Exam: 08/08/2023 Medical Rec #:  [id number]    Height:       76.0 in Accession #:    [id number]   Weight:       189.6 lb Date of Birth:  [birth year]    BSA:          2.165 m Patient Age:    38 years     BP:           122/74 mmHg Patient Gender: M            HR:           97 bpm. Exam Location:  Inpatient Procedure: 2D Echo, 3D Echo, Cardiac Doppler and Color Doppler (Both Spectral            and Color Flow Doppler were utilized during procedure). 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 Previous Myocardial Infarction,                 Arrythmias:Atrial Fibrillation; Signs/Symptoms:Chest Pain.                  Mitral Valve: Mitra-Clip valve is present in the mitral                 position. Procedure Date: 07/16/2023.  Sonographer:    Lucendia Herrlich RCS Referring Phys: [id number] KATHRYN R THOMPSON IMPRESSIONS  1. Left ventricular ejection fraction, by estimation, is 40 to 45%. Left ventricular ejection fraction by 3D volume is 40 %. The left ventricle has mildly decreased function. The left ventricle demonstrates global hypokinesis. There is mild left ventricular hypertrophy. Left ventricular diastolic parameters are consistent with Grade II diastolic dysfunction (pseudonormalization).  2. Right ventricular systolic function is normal. The right ventricular size is normal. There is normal  pulmonary artery systolic pressure. The estimated right ventricular systolic pressure is 26.6 mmHg.  3. Left atrial size was moderately dilated.  4. S/p mTEER with 3 XTW clips (implant 07/16/2023), mild mitral regurgitation, possible mild stenosis (Mean gradient 6 mmHG, MVA (VTI) 1.6cm2, RVSP , HR 91bpm). Consider a repeat limited echocardiogarm when heart rate better controlled. . There is  a Mitra-Clip present in the mitral position. Procedure Date: 07/16/2023.  5. The aortic valve is tricuspid. Aortic valve regurgitation is mild. No aortic stenosis is present.  6. The inferior vena cava is normal in size with greater than 50% respiratory variability, suggesting right atrial pressure of 3 mmHg. FINDINGS  Left Ventricle: Left ventricular ejection fraction, by estimation, is 40 to 45%.  Left ventricular ejection fraction by 3D volume is 40 %. The left ventricle has mildly decreased function. The left ventricle demonstrates global hypokinesis. The left ventricular internal cavity size was normal in size. There is mild left ventricular hypertrophy. Left ventricular diastolic parameters are consistent with Grade II diastolic dysfunction (pseudonormalization). Right Ventricle: The right ventricular size is normal. No increase in right ventricular wall thickness. Right ventricular systolic function is normal. There is normal pulmonary artery systolic pressure. The tricuspid regurgitant velocity is 2.43 m/s, and  with an assumed right atrial pressure of 3 mmHg, the estimated right ventricular systolic pressure is 26.6 mmHg. Left Atrium: Left atrial size was moderately dilated. Right Atrium: Right atrial size was normal in size. Pericardium: There is no evidence of pericardial effusion. Mitral Valve: S/p mTEER with 3 XTW clips (implant 07/16/2023), mild mitral regurgitation, possible mild stenosis (Mean gradient 6 mmHG, MVA (VTI) 1.6cm2, RVSP , HR 91bpm). Consider a repeat limited echocardiogarm when heart rate  better controlled. There is a Mitra-Clip present in the mitral position. Procedure Date: 07/16/2023. MV peak gradient, 14.7 mmHg. The mean mitral valve gradient is 6.0 mmHg. Tricuspid Valve: The tricuspid valve is normal in structure. Tricuspid valve regurgitation is mild . No evidence of tricuspid stenosis. Aortic Valve: The aortic valve is tricuspid. Aortic valve regurgitation is mild. No aortic stenosis is present. Aortic valve peak gradient measures 5.7 mmHg. Pulmonic Valve: The pulmonic valve was normal in structure. Pulmonic valve regurgitation is not visualized. No evidence of pulmonic stenosis. Aorta: The aortic root and ascending aorta are structurally normal, with no evidence of dilitation. Venous: The inferior vena cava is normal in size with greater than 50% respiratory variability, suggesting right atrial pressure of 3 mmHg. IAS/Shunts: The atrial septum is grossly normal. Additional Comments: 3D was performed not requiring image post processing on an independent workstation and was abnormal.  LEFT VENTRICLE PLAX 2D LVIDd:         4.60 cm         Diastology LVIDs:         3.60 cm         LV e' medial:   8.38 cm/s LV PW:         1.20 cm         LV E/e' medial: 14.9 LV IVS:        1.40 cm LVOT diam:     2.60 cm LV SV:         70              3D Volume EF LV SV Index:   32              LV 3D EF:    Left LVOT Area:     5.31 cm                     ventricul                                             ar                                             ejection  fraction                                             by 3D                                             volume is                                             40 %.                                 3D Volume EF:                                3D EF:        40 %                                LV EDV:       199 ml                                LV ESV:       120 ml                                LV SV:        79 ml  RIGHT VENTRICLE             IVC RV S prime:     18.20 cm/s  IVC diam: 1.10 cm TAPSE (M-mode): 3.0 cm LEFT ATRIUM              Index        RIGHT ATRIUM           Index LA diam:        3.30 cm  1.52 cm/m   RA Area:     21.00 cm LA Vol (A2C):   104.0 ml 48.03 ml/m  RA Volume:   60.05 ml  27.73 ml/m LA Vol (A4C):   84.9 ml  39.21 ml/m LA Biplane Vol: 100.0 ml 46.18 ml/m  AORTIC VALVE AV Area (Vmax): 4.39 cm AV Vmax:        119.50 cm/s AV Peak Grad:   5.7 mmHg LVOT Vmax:      98.70 cm/s LVOT Vmean:     61.967 cm/s LVOT VTI:       0.132 m  AORTA Ao Root diam: 3.50 cm Ao Asc diam:  3.30 cm MITRAL VALVE                TRICUSPID VALVE MV Area (PHT): 2.87 cm     TR Peak grad:   23.6 mmHg MV Area VTI:   1.63 cm     TR Vmax:        243.00 cm/s MV Peak grad:  14.7 mmHg MV  Mean grad:  6.0 mmHg     SHUNTS MV Vmax:       1.92 m/s     Systemic VTI:  0.13 m MV Vmean:      116.0 cm/s   Systemic Diam: 2.60 cm MV Decel Time: 264 msec MV E velocity: 125.00 cm/s MV A velocity: 175.00 cm/s MV E/A ratio:  0.71 Sunit Tolia Electronically signed by Tessa Lerner Signature Date/Time: 08/08/2023/1:47:10 PM    Final    IR Fluoro Guide CV Line Right Result Date: 08/08/2023 INDICATION: ESRD, requiring HD. EXAM: TUNNELED CENTRAL VENOUS HEMODIALYSIS CATHETER PLACEMENT WITH ULTRASOUND AND FLUOROSCOPIC GUIDANCE MEDICATIONS: Ancef 2 gm IV. The antibiotic was given in an appropriate time interval prior to skin puncture. ANESTHESIA/SEDATION: Moderate (conscious) sedation was employed during this procedure. A total of Versed 1 mg and Fentanyl 25 mcg was administered intravenously. Moderate Sedation Time: 15 minutes. The patient's level of consciousness and vital signs were monitored continuously by radiology nursing throughout the procedure under my direct supervision. FLUOROSCOPY TIME:  Fluoroscopic dose; 1 mGy COMPLICATIONS: None immediate. PROCEDURE: Informed written consent was obtained from the patient after a discussion of the risks,  benefits, and alternatives to treatment. Questions regarding the procedure were encouraged and answered. The RIGHT neck and chest were prepped with chlorhexidine in a sterile fashion, and a sterile drape was applied covering the operative field. Maximum barrier sterile technique with sterile gowns and gloves were used for the procedure. A timeout was performed prior to the initiation of the procedure. After creating a small venotomy incision, a micropuncture kit was utilized to access the internal jugular vein. Real-time ultrasound guidance was utilized for vascular access including the acquisition of a permanent ultrasound image documenting patency of the accessed vessel. The microwire was utilized to measure appropriate catheter length. A stiff Glidewire was advanced to the level of the IVC and the micropuncture sheath was exchanged for a peel-away sheath. A palindrome tunneled hemodialysis catheter measuring 23 cm from tip to cuff was tunneled in a retrograde fashion from the anterior chest wall to the venotomy incision. The catheter was then placed through the peel-away sheath with tips ultimately positioned within the superior aspect of the right atrium. Final catheter positioning was confirmed and documented with a spot radiographic image. The catheter aspirates and flushes normally. The catheter was flushed with appropriate volume heparin dwells. The catheter exit site was secured with a 0-Prolene retention suture. The venotomy incision was closed with Dermabond. Dressings were applied. The patient tolerated the procedure well without immediate post procedural complication. IMPRESSION: Successful placement of 23 cm tip to cuff tunneled hemodialysis catheter via the RIGHT internal jugular vein. The tip of the catheter is positioned within the proximal RIGHT atriumat the superior cavo-atrial junction. The catheter is ready for immediate use. Roanna Banning, MD Vascular and Interventional Radiology Specialists  [facility] Radiology Electronically Signed   By: Roanna Banning M.D.   On: 08/08/2023 13:46   IR US Guide Vasc Access Right Result Date: 08/08/2023 INDICATION: ESRD, requiring HD. EXAM: TUNNELED CENTRAL VENOUS HEMODIALYSIS CATHETER PLACEMENT WITH ULTRASOUND AND FLUOROSCOPIC GUIDANCE MEDICATIONS: Ancef 2 gm IV. The antibiotic was given in an appropriate time interval prior to skin puncture. ANESTHESIA/SEDATION: Moderate (conscious) sedation was employed during this procedure. A total of Versed 1 mg and Fentanyl 25 mcg was administered intravenously. Moderate Sedation Time: 15 minutes. The patient's level of consciousness and vital signs were monitored continuously by radiology nursing throughout the procedure under my direct supervision. FLUOROSCOPY TIME:  Fluoroscopic dose;  1 mGy COMPLICATIONS: None immediate. PROCEDURE: Informed written consent was obtained from the patient after a discussion of the risks, benefits, and alternatives to treatment. Questions regarding the procedure were encouraged and answered. The RIGHT neck and chest were prepped with chlorhexidine in a sterile fashion, and a sterile drape was applied covering the operative field. Maximum barrier sterile technique with sterile gowns and gloves were used for the procedure. A timeout was performed prior to the initiation of the procedure. After creating a small venotomy incision, a micropuncture kit was utilized to access the internal jugular vein. Real-time ultrasound guidance was utilized for vascular access including the acquisition of a permanent ultrasound image documenting patency of the accessed vessel. The microwire was utilized to measure appropriate catheter length. A stiff Glidewire was advanced to the level of the IVC and the micropuncture sheath was exchanged for a peel-away sheath. A palindrome tunneled hemodialysis catheter measuring 23 cm from tip to cuff was tunneled in a retrograde fashion from the anterior chest wall to the  venotomy incision. The catheter was then placed through the peel-away sheath with tips ultimately positioned within the superior aspect of the right atrium. Final catheter positioning was confirmed and documented with a spot radiographic image. The catheter aspirates and flushes normally. The catheter was flushed with appropriate volume heparin dwells. The catheter exit site was secured with a 0-Prolene retention suture. The venotomy incision was closed with Dermabond. Dressings were applied. The patient tolerated the procedure well without immediate post procedural complication. IMPRESSION: Successful placement of 23 cm tip to cuff tunneled hemodialysis catheter via the RIGHT internal jugular vein. The tip of the catheter is positioned within the proximal RIGHT atriumat the superior cavo-atrial junction. The catheter is ready for immediate use. Roanna Banning, MD Vascular and Interventional Radiology Specialists [facility] Radiology Electronically Signed   By: Roanna Banning M.D.   On: 08/08/2023 13:46   DG Abd 2 Views Result Date: 08/07/2023 CLINICAL DATA:  101717 Nausea 101717 EXAM: ABDOMEN - 2 VIEW COMPARISON:  07/31/2023 FINDINGS: The bowel gas pattern is normal. Enteric contrast is present within the colon and rectum. There is no evidence of free air. No radio-opaque calculi or other significant radiographic abnormality is seen. IMPRESSION: Negative. Electronically Signed   By: Duanne Guess D.O.   On: 08/07/2023 15:00    Recent Labs    08/07/23 0415 08/08/23 0438  WBC 5.4 7.9  HGB 10.7* 10.5*  HCT 34.3* 33.4*  PLT 315 291   Recent Labs    08/07/23 0415 08/08/23 0438  NA 134* 137  K 4.0 4.3  CL 95* 100  CO2 26 23  GLUCOSE 91 96  BUN 36* 49*  CREATININE 5.66* 6.21*  CALCIUM 8.6* 8.5*    Intake/Output Summary (Last 24 hours) at 08/08/2023 1618 Last data filed at 08/08/2023 1300 Gross per 24 hour  Intake 1300 ml  Output 300 ml  Net 1000 ml     Pressure Injury 07/23/23 Sacrum Mid  Stage 2 -  Partial thickness loss of dermis presenting as a shallow open injury with a red, pink wound bed without slough. light pink (Active)  07/23/23 1200  Location: Sacrum  Location Orientation: Mid  Staging: Stage 2 -  Partial thickness loss of dermis presenting as a shallow open injury with a red, pink wound bed without slough.  Wound Description (Comments): light pink  Present on Admission: Yes    Physical Exam: Vital Signs Blood pressure 123/89, pulse 88, temperature 99.6 F (37.6 C), temperature source  Oral, resp. rate (!) 23, height 6\' 4"  (1.93 m), weight 87.1 kg, SpO2 100%.   General: NAD,  fatigued appearing Pleasant  Heart: RRR Lungs: Clear to auscultation, breathing unlabored, no rales or wheezes Abdomen: Positive bowel sounds, soft nontender to palpation, nondistended Extremities: No clubbing, cyanosis, or edema Skin: No evidence of breakdown, no evidence of rash, TDC R internal jugular and Left Temporary HD cath in place Neurologic: Cranial nerves II through XII intact, motor strength is 5/5 in bilateral deltoid, bicep, tricep, grip, 4/5 hip flexor, knee extensors, ankle dorsiflexor and plantar flexor Sensory exam normal sensation to light touch in bilateral upper and lower extremities Musculoskeletal: Full range of motion in all 4 extremities. No joint swelling  Neurological:     Comments: Alert and oriented x 3, follows commands    Assessment/Plan: 1. Functional deficits which require 3+ hours per day of interdisciplinary therapy in a comprehensive inpatient rehab setting. Physiatrist is providing close team supervision and 24 hour management of active medical problems listed below. Physiatrist and rehab team continue to assess barriers to discharge/monitor patient progress toward functional and medical goals  Care Tool:  Bathing    Body parts bathed by patient: Right arm, Left arm, Chest, Abdomen, Front perineal area, Buttocks, Right upper leg, Left upper  leg, Face   Body parts bathed by helper: Right lower leg, Left lower leg Body parts n/a: Right lower leg, Left lower leg   Bathing assist Assist Level: Contact Guard/Touching assist     Upper Body Dressing/Undressing Upper body dressing   What is the patient wearing?: Pull over shirt    Upper body assist Assist Level: Set up assist    Lower Body Dressing/Undressing Lower body dressing      What is the patient wearing?: Underwear/pull up, Pants     Lower body assist Assist for lower body dressing: Contact Guard/Touching assist     Toileting Toileting    Toileting assist Assist for toileting: Contact Guard/Touching assist     Transfers Chair/bed transfer  Transfers assist     Chair/bed transfer assist level: Contact Guard/Touching assist     Locomotion Ambulation   Ambulation assist      Assist level: Minimal Assistance - Patient > 75% Assistive device: Walker-rolling Max distance: 55   Walk 10 feet activity   Assist     Assist level: Minimal Assistance - Patient > 75% Assistive device: Walker-rolling   Walk 50 feet activity   Assist    Assist level: Minimal Assistance - Patient > 75% Assistive device: Walker-rolling    Walk 150 feet activity   Assist Walk 150 feet activity did not occur: Safety/medical concerns (fatigue)         Walk 10 feet on uneven surface  activity   Assist Walk 10 feet on uneven surfaces activity did not occur: Safety/medical concerns (fatigue)         Wheelchair     Assist Is the patient using a wheelchair?: Yes Type of Wheelchair: Manual    Wheelchair assist level: Dependent - Patient 0%      Wheelchair 50 feet with 2 turns activity    Assist        Assist Level: Dependent - Patient 0%   Wheelchair 150 feet activity     Assist      Assist Level: Dependent - Patient 0%   Blood pressure 123/89, pulse 88, temperature 99.6 F (37.6 C), temperature source Oral, resp. rate (!)  23, height 6\' 4"  (1.93 m), weight  87.1 kg, SpO2 100%.  Medical Problem List and Plan: 1. 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  Antithrombotics: -DVT/anticoagulation:  Pharmaceutical: Heparin transitioning to Eliquis after [facility] HD catheter placed- pharmacy consult was placed today             -antiplatelet therapy: Aspirin 81 mg daily 3. Pain Management: Oxycodone as needed 4. Mood/Behavior/Sleep: Provide emotional support             -antipsychotic agents: N/A  -3/25 melatonin as needed ordered for insomnia  -improved with melatonin  5. Neuropsych/cognition: This patient is capable of making decisions on his own behalf. 6. Skin/Wound Care: Routine skin checks 7. Fluids/Electrolytes/Nutrition: Routine in and outs with follow-up chemistries 8.  Cardiogenic shock/acute systolic congestive heart failure/A-fib/severe MR/SVT/PEA arrest.  ROSC x 40 minutes.  Follow-up cardiology services status post ECMO with Impella, decannulated 3/8 and Impella removed and extubated 3/12-reintubated but eventually extubated again 3/16.  Currently on amiodarone 200 mg daily  -3/25 HF team, cardiology plans to stop Amio before discharge.  Appreciate assistance  3/27 wt a little lower after dialysis yesterday   Filed Weights   08/07/23 0500 08/08/23 0500 08/08/23 1252  Weight: 82.4 kg 86 kg 87.1 kg    9.  Acute kidney injury due to ATN/hyperkalemia.  Follow-up renal services.  Required CRRT transition to hemodialysis  - 3/27 figured after HD yesterday, nephrology ordering tunneled cath, he is NPO for his  3/28 [facility] today, need temporary HD cath removed  10.  Normocytic anemia.   Follow-up CBC.  Continue Aranesp  -3/27 HGB stable at 10.7  Aranexp was decreased to every Friday by renal 11.  Transaminitis.  Likely shock liver.  Follow-up chemistries 12.  Decreased nutritional storage.  Dietary follow-up.  Follow-up MBS.  -3/25 by nutrition today 13.  Hypothyroidism.  Continue Tapazole 10 mg daily.  Need to check TSH in 4 weeks. 14.  Nausea, likely dialysis associated  -Will spread out medications.  Amiodarone may be contributing to this.  -2/27 will check abd xray- no acute findings 15. Dysphagia -Continue SLP   16.  Constipation  -3/27 spent a lot of time on the commode trying to have a bowel movement, check abdomen x-ray and schedule MiraLAX  -3/28 LBM yesterday large 17. Hyperglycemia?  -CBGs stable, Discontinue checks     LOS: 4 days A FACE TO FACE EVALUATION WAS PERFORMED  Fanny Dance 08/08/2023, 4:18 PM

## 2023-08-08 NOTE — Progress Notes (Addendum)
 Patient ID: Aaron [name], male   DOB: 04-15-1986, 38 y.o.   MRN: [id number]     Advanced Heart Failure Rounding Note  Cardiologist: Christell Constant, MD  Chief Complaint: V-A ECMO  Subjective:    Patient seen in HD. Tolerating well. Denies SOB.   Decent urine output   TDC placed today   Significant events  - Admitted 2/26 with worsening SOB, CP and a fib with RVR. - Decompensated 2/27 with IVF, nodal blockade with subsequent respiratory arrest, ROSC achieved after ~16 minute down time - Femoral VA ecmo cannulation 2/27 with Impella CP vent - ECMO decannulation 3/8 - Impella removed and extubated 3/12.  Had to be reintubated with mucus plugging and left lung opacification.  - MRI head 3/13 with small infarcts right cerebral and cerebellar hemispheres - Re-extubated 3/16 - >CIR 3/24  Objective:   Echo 08/01/23 EF 40-45% mild residual MR. RV ok   Weight Range: 87.1 kg Body mass index is 23.37 kg/m.   Vital Signs:   Temp:  [98.7 F (37.1 C)-99.6 F (37.6 C)] 99.6 F (37.6 C) (03/28 1252) Pulse Rate:  [83-103] 89 (03/28 1530) Resp:  [16-27] 25 (03/28 1530) BP: (113-127)/(74-89) 121/82 (03/28 1530) SpO2:  [99 %-100 %] 100 % (03/28 1530) FiO2 (%):  [21 %] 21 % (03/27 2006) Weight:  [86 kg-87.1 kg] 87.1 kg (03/28 1252) Last BM Date : 08/05/23  Weight change: Filed Weights   08/07/23 0500 08/08/23 0500 08/08/23 1252  Weight: 82.4 kg 86 kg 87.1 kg    Intake/Output:   Intake/Output Summary (Last 24 hours) at 08/08/2023 1602 Last data filed at 08/08/2023 1300 Gross per 24 hour  Intake 1300 ml  Output 300 ml  Net 1000 ml    Physical Exam  General:  Sitting up on HD No resp difficulty HEENT: normal Neck: supple. JVP 6. Carotids 2+ bilat; no bruits. No lymphadenopathy or thryomegaly appreciated. Cor: PMI nondisplaced. Regular rate & rhythm. No rubs, gallops or murmurs. + TDC  Lungs: clear Abdomen: soft, nontender, nondistended. No hepatosplenomegaly. No bruits or  masses. Good bowel sounds. Extremities: no cyanosis, clubbing, rash, edema Neuro: alert & orientedx3, cranial nerves grossly intact. moves all 4 extremities w/o difficulty. Affect pleasant  Labs  CBC Recent Labs    08/07/23 0415 08/08/23 0438  WBC 5.4 7.9  HGB 10.7* 10.5*  HCT 34.3* 33.4*  MCV 94.8 94.9  PLT 315 291   Basic Metabolic Panel Recent Labs    40/98/11 0415 08/08/23 0438  NA 134* 137  K 4.0 4.3  CL 95* 100  CO2 26 23  GLUCOSE 91 96  BUN 36* 49*  CREATININE 5.66* 6.21*  CALCIUM 8.6* 8.5*  PHOS 6.9* 7.2*   Liver Function Tests Recent Labs    08/07/23 0415 08/08/23 0438  ALBUMIN 2.6* 2.6*   BNP: BNP (last 3 results) Recent Labs    07/09/23 1934 07/10/23 1224  BNP 703.7* 980.5*   Thyroid Function Tests No results for input(s): "TSH", "T4TOTAL", "T3FREE", "THYROIDAB" in the last 72 hours.  Invalid input(s): "FREET3"  Medications:   Scheduled Medications:  [START ON 08/09/2023] apixaban  5 mg Oral BID   arformoterol  15 mcg Nebulization BID   ascorbic acid  250 mg Oral BID   aspirin  81 mg Oral Daily   budesonide (PULMICORT) nebulizer solution  0.25 mg Nebulization BID   Chlorhexidine Gluconate Cloth  6 each Topical Q12H   Chlorhexidine Gluconate Cloth  6 each Topical Q0600   darbepoetin (  ARANESP) injection - DIALYSIS  100 mcg Subcutaneous Q Fri-1800   feeding supplement (NEPRO CARB STEADY)  237 mL Oral Q24H   folic acid  1 mg Oral Daily   heparin sodium (porcine)       methimazole  10 mg Oral Daily   multivitamin  1 tablet Oral QHS   polyethylene glycol  17 g Oral Daily   revefenacin  175 mcg Nebulization Daily   sodium chloride flush  10-40 mL Intracatheter Q12H   thiamine  100 mg Oral Daily  Infusions:    PRN Medications: acetaminophen, Gerhardt's butt cream, heparin, heparin sodium (porcine), lactulose, melatonin, oxyCODONE, polyethylene glycol, promethazine Patient Profile   Patient with a history of Grave's disease, severe  primary mitral regurgitation who presented with chest pain and segmental PE. Subsequent progressed to SCAI Stage E shock requiring VA ECMO cannulation on 2/27.   Assessment/Plan  SCAI Stage E Cardiogenic shock - Suspect hypoxic respiratory failure driven with segmental PE on top of severe MR, nodal blockage, and thyrotoxicosis - Emergent VA ECMO with Impella vent(decannulated 07/19/23) - Underwent successful mTEER on 07/16/23 with placement of x3 clips - Echo 3/10: EF 35-40%, anterior and septal HK, normal RV, s/p Mitraclip with mild MR and mean gradient 7 mmHg.  - Impella removed 3/12.  - Echo 08/01/23 EF 40-45% mild residual MR. RV ok  - Doing well. GDMT limited by AKI. Can add Bidil as tolerated.   Severe mitral valve regurgitation - Now s/p mTEER on 07/16/23; improvement in MR from severe to mild.  - Echo with mild residual MR  Pulmonary hypertension - Due to severe MR - Now much improved.   VT/NSVT - Initially with frequent runs of NSVT likely secondary to impella 5.5 position. - now off Impella  Acute renal failure/hyperkalemia - Nephrology following. Now on iHD.  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   Atrial fibrillation - PAF in setting of shock - now back in sinus - amio off  - TDC now in. Switch heparin to Eliquis  Pulmonary embolism:  - Segmental without right heart strain - TDC now in. Switch heparin to Eliquis  CAD - Prior embolic infarct in the LAD territory with corresponding LGE on CMR - Lifelong anticoagulation - Can stop ASA with Eliquis  CVA - MRI head 3/13 with small infarcts right cerebral and cerebellar hemispheres. This should not significantly affect consciousness.  - Suspect embolic. No residual;  We will continue to follow at a distance. Please call with  questions.    Length of Stay: 4  Arvilla Meres, MD  08/08/2023, 4:02 PM  Advanced Heart Failure Team Pager [phone] (M-F; 7a - 5p)  Please contact [facility] for night-coverage after hours (5p -7a ) and weekends on [email or web address]

## 2023-08-08 NOTE — Progress Notes (Signed)
 Echocardiogram 2D Echocardiogram has been performed.  Aaron [name] 08/08/2023, 11:58 AM

## 2023-08-08 NOTE — Progress Notes (Signed)
 Physical Therapy Session Note  Patient Details  Name: DONYAE KILNER MRN: [id number] Date of Birth: 1985/09/27  Today's Date: 08/08/2023 PT Individual Time: 1031-1115 PT Individual Time Calculation (min): 44 min   Short Term Goals: Week 1:  PT Short Term Goal 1 (Week 1): Pt will ambulate 150 feet with LRAD and supervision PT Short Term Goal 2 (Week 1): pt will perform outcome measure assessment PT Short Term Goal 3 (Week 1): pt will perform stand pivot transfer with LRAD and supervision  Skilled Therapeutic Interventions/Progress Updates:  Patient supine in bed on entrance to room. Patient alert and agreeable to PT session. Eating breakfast as he has finally received it following return from IR for tunneled cath placement.   Patient with no pain complaint at start of session.  Therapeutic Activity: Bed Mobility: Pt performed supine <> sit with Mod I using bed rails.  Transfers: Pt performed sit<>stand and stand pivot transfers throughout session with supervision. No cueing required for technique.   Gait Training:  Pt ambulated 17' x2 using no AD with CGA for varaible stepping placement d/t balance and coordination deficits. Pt is utilizing stepping strategy for more minor imbalances. Return trip following session in day room with improved quality of gait and balance.   Ambulation about day room during NMR tasks with improved quality of gait over shorter distances. Following NMR tasks, pt challenged in resisted ambulation with good floor clearance bilaterally and good foot placement d/t need for increased focus to pull against tension provided at gait belt around pelvis. During ambulation provided with quick and unannounced changes in tension to belt. Good reactive balance with appropriate balance strategies as needed.   Neuromuscular Re-ed: NMR facilitated during session with focus on standing balance. Pt guided in move of ping pong balls from [place] and cones on hi-lo table. Wheelchair  setup near table and pt guided in posterior step back, lateral squats/ lunges, 180* and 360* turns to touch w/c in order to reproduce movements required in barber shop. Pt maintains good balance throughout. Then is able to carry cones with pingpong balls placed on top from one end of therapy gym to other with w/c and then round table as obstacles. No LOB or inability to maneuver noted.  NMR performed for improvements in motor control and coordination, balance, sequencing, judgement, and self confidence/ efficacy in performing all aspects of mobility at highest level of independence.   Patient supine in bed at end of session with brakes locked, bed alarm set, and all needs within reach. Tech arriving for ordered echocardiogram. Pt positioned for procedure, then off to dialysis.    Therapy Documentation Precautions:  Precautions Precautions: Fall Precaution/Restrictions Comments: Monitor vitals; HD MWF; Painful L ankle fx from 2010, mild L hemiparesis Restrictions Weight Bearing Restrictions Per Provider Order: No RLE Weight Bearing Per Provider Order: Non weight bearing LLE Weight Bearing Per Provider Order: Non weight bearing  Pain: No pain related during session.   Therapy/Group: Individual Therapy  Loel Dubonnet PT, DPT, CSRS 08/08/2023, 12:31 PM

## 2023-08-08 NOTE — Progress Notes (Signed)
 Patient arrived back on unit after IR. A&Ox4  and requested to eat. Heparin infusion was seen disconnected. Left subclavian HD cath still present. Nurse informed MD.   MD stated to hold heparin and plan for eliquis.

## 2023-08-08 NOTE — Progress Notes (Signed)
 Received patient in bed to unit.  Alert and oriented.  Informed consent signed and in chart.   TX duration:2hrs 54 min - significant clotting in dialyzer - tx terminated to avoid additional blood loss from ECC clotting  Patient tolerated well.  Transported back to the room  Alert, without acute distress.  Hand-off given to patient's nurse.   Access used: RTDC Access issues: poor flow, unable to achieve rx BFR  Total UF removed: 0 Medication(s) given: post HD tx TDC heparin block   08/08/23 1619  Vitals  Temp 98.9 F (37.2 C)  BP 132/82  Pulse Rate 92  Resp (!) 23  Oxygen Therapy  SpO2 100 %  O2 Device Room Air  Patient Activity (if Appropriate) In bed  Pulse Oximetry Type Continuous  Oximetry Probe Site Changed No  During Treatment Monitoring  Blood Flow Rate (mL/min) 299 mL/min  Arterial Pressure (mmHg) -155.75 mmHg  Venous Pressure (mmHg) 292.91 mmHg  TMP (mmHg) -42.82 mmHg  Ultrafiltration Rate (mL/min) 610 mL/min  Dialysate Flow Rate (mL/min) 300 ml/min  Dialysate Potassium Concentration 2  Dialysate Calcium Concentration 2.5  Duration of HD Treatment -hour(s) 2.88 hour(s)  Cumulative Fluid Removed (mL) per Treatment  -70.8  HD Safety Checks Performed Yes  Intra-Hemodialysis Comments Tx completed  Post Treatment  Dialyzer Clearance Heavily streaked  Liters Processed 52.2  Fluid Removed (mL) 0 mL  Tolerated HD Treatment Yes  Hemodialysis Catheter Right Internal jugular Double lumen Permanent (Tunneled)  Placement Date/Time: 08/08/23 0910   Placed prior to admission: No  Serial / Lot #: [id number]  Expiration Date: 12/22/27  Time Out: Correct patient;Correct site;Correct procedure  Maximum sterile barrier precautions: Hand hygiene;Mask;Sterile gown;Ca...  Site Condition No complications  Blue Lumen Status Flushed;Heparin locked  Red Lumen Status Flushed;Heparin locked  Catheter fill solution Heparin 1000 units/ml  Catheter fill volume (Arterial) 1.9 cc   Catheter fill volume (Venous) 1.9  Dressing Type Transparent  Dressing Status Antimicrobial disc/dressing in place  Post treatment catheter status Capped and Clamped      Freddi Starr, RN [facility]

## 2023-08-09 DIAGNOSIS — R5381 Other malaise: Secondary | ICD-10-CM | POA: Diagnosis not present

## 2023-08-09 DIAGNOSIS — R079 Chest pain, unspecified: Secondary | ICD-10-CM | POA: Diagnosis not present

## 2023-08-09 DIAGNOSIS — N170 Acute kidney failure with tubular necrosis: Secondary | ICD-10-CM | POA: Diagnosis not present

## 2023-08-09 DIAGNOSIS — E875 Hyperkalemia: Secondary | ICD-10-CM | POA: Diagnosis not present

## 2023-08-09 DIAGNOSIS — J9601 Acute respiratory failure with hypoxia: Secondary | ICD-10-CM | POA: Diagnosis not present

## 2023-08-09 DIAGNOSIS — R0602 Shortness of breath: Secondary | ICD-10-CM | POA: Diagnosis not present

## 2023-08-09 DIAGNOSIS — I469 Cardiac arrest, cause unspecified: Secondary | ICD-10-CM | POA: Diagnosis not present

## 2023-08-09 DIAGNOSIS — E872 Acidosis, unspecified: Secondary | ICD-10-CM | POA: Diagnosis not present

## 2023-08-09 DIAGNOSIS — D649 Anemia, unspecified: Secondary | ICD-10-CM | POA: Diagnosis not present

## 2023-08-09 DIAGNOSIS — R570 Cardiogenic shock: Secondary | ICD-10-CM | POA: Diagnosis not present

## 2023-08-09 LAB — RENAL FUNCTION PANEL
Albumin: 2.6 g/dL — ABNORMAL LOW (ref 3.5–5.0)
Anion gap: 13 (ref 5–15)
BUN: 36 mg/dL — ABNORMAL HIGH (ref 6–20)
CO2: 26 mmol/L (ref 22–32)
Calcium: 8.5 mg/dL — ABNORMAL LOW (ref 8.9–10.3)
Chloride: 98 mmol/L (ref 98–111)
Creatinine, Ser: 4.66 mg/dL — ABNORMAL HIGH (ref 0.61–1.24)
GFR, Estimated: 16 mL/min — ABNORMAL LOW (ref >60)
Glucose, Bld: 96 mg/dL (ref 70–99)
Phosphorus: 5.8 mg/dL — ABNORMAL HIGH (ref 2.5–4.6)
Potassium: 4 mmol/L (ref 3.5–5.1)
Sodium: 137 mmol/L (ref 135–145)

## 2023-08-09 LAB — CBC
HCT: 34.6 % — ABNORMAL LOW (ref 39.0–52.0)
Hemoglobin: 11 g/dL — ABNORMAL LOW (ref 13.0–17.0)
MCH: 29.8 pg (ref 26.0–34.0)
MCHC: 31.8 g/dL (ref 30.0–36.0)
MCV: 93.8 fL (ref 80.0–100.0)
Platelets: 228 10*3/uL (ref 150–400)
RBC: 3.69 MIL/uL — ABNORMAL LOW (ref 4.22–5.81)
RDW: 15 % (ref 11.5–15.5)
WBC: 7.2 10*3/uL (ref 4.0–10.5)
nRBC: 0 % (ref 0.0–0.2)

## 2023-08-09 LAB — GLUCOSE, CAPILLARY: Glucose-Capillary: 93 mg/dL (ref 70–99)

## 2023-08-09 NOTE — Progress Notes (Signed)
 Patient and family member requested 20 G right AC to be removed. Patient stated, "I don't really need it here. My HD cath is the only one that's being used."  MD made aware. Order to d/c IV line given.

## 2023-08-09 NOTE — Plan of Care (Signed)
  Problem: RH BLADDER ELIMINATION Goal: RH STG MANAGE BLADDER WITH ASSISTANCE Description: STG Manage Bladder With  supervision Assistance Outcome: Progressing   Problem: RH SKIN INTEGRITY Goal: RH STG SKIN FREE OF INFECTION/BREAKDOWN Outcome: Progressing   Problem: RH SAFETY Goal: RH STG ADHERE TO SAFETY PRECAUTIONS W/ASSISTANCE/DEVICE Description: STG Adhere to Safety Precautions With  supervision Assistance/Device. Outcome: Progressing

## 2023-08-09 NOTE — Progress Notes (Signed)
 PROGRESS NOTE   Subjective/Complaints:  Vitals stable AM labs stable; phos down to 5.8 Requesting peripheral IV removal since replacement of HD catheter port yesterday; nursing confirms they can draw labs and push meds through here. He endorses orthostasis symptoms have improved significantly, denies any current concerns  ROS: Patient denies fever, new vision changes,  diarrhea,  vomiting,  or chest pain, headache, or mood change.  Objective:   ECHOCARDIOGRAM COMPLETE Result Date: 08/08/2023    ECHOCARDIOGRAM REPORT   Patient Name:   Aaron [name] Date of Exam: 08/08/2023 Medical Rec #:  [id number]    Height:       76.0 in Accession #:    [id number]   Weight:       189.6 lb Date of Birth:  [birth year]    BSA:          2.165 m Patient Age:    38 years     BP:           122/74 mmHg Patient Gender: M            HR:           97 bpm. Exam Location:  Inpatient Procedure: 2D Echo, 3D Echo, Cardiac Doppler and Color Doppler (Both Spectral            and Color Flow Doppler were utilized during procedure). 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 Previous Myocardial Infarction,                 Arrythmias:Atrial Fibrillation; Signs/Symptoms:Chest Pain.                  Mitral Valve: Mitra-Clip valve is present in the mitral                 position. Procedure Date: 07/16/2023.  Sonographer:    Lucendia Herrlich RCS Referring Phys: [id number] KATHRYN R THOMPSON IMPRESSIONS  1. Left ventricular ejection fraction, by estimation, is 40 to 45%. Left ventricular ejection fraction by 3D volume is 40 %. The left ventricle has mildly decreased function. The left ventricle demonstrates global hypokinesis. There is mild left ventricular hypertrophy. Left ventricular diastolic parameters are consistent with Grade II diastolic dysfunction (pseudonormalization).   2. Right ventricular systolic function is normal. The right ventricular size is normal. There is normal pulmonary artery systolic pressure. The estimated right ventricular systolic pressure is 26.6 mmHg.  3. Left atrial size was moderately dilated.  4. S/p mTEER with 3 XTW clips (implant 07/16/2023), mild mitral regurgitation, possible mild stenosis (Mean gradient 6 mmHG, MVA (VTI) 1.6cm2, RVSP , HR 91bpm). Consider a repeat limited echocardiogarm when heart rate better controlled. . There is  a Mitra-Clip present in the mitral position. Procedure Date: 07/16/2023.  5. The aortic valve is tricuspid. Aortic valve regurgitation is mild. No aortic stenosis is present.  6. The inferior vena cava is normal in size with greater than 50% respiratory variability, suggesting right atrial pressure of 3 mmHg.  FINDINGS  Left Ventricle: Left ventricular ejection fraction, by estimation, is 40 to 45%. Left ventricular ejection fraction by 3D volume is 40 %. The left ventricle has mildly decreased function. The left ventricle demonstrates global hypokinesis. The left ventricular internal cavity size was normal in size. There is mild left ventricular hypertrophy. Left ventricular diastolic parameters are consistent with Grade II diastolic dysfunction (pseudonormalization). Right Ventricle: The right ventricular size is normal. No increase in right ventricular wall thickness. Right ventricular systolic function is normal. There is normal pulmonary artery systolic pressure. The tricuspid regurgitant velocity is 2.43 m/s, and  with an assumed right atrial pressure of 3 mmHg, the estimated right ventricular systolic pressure is 26.6 mmHg. Left Atrium: Left atrial size was moderately dilated. Right Atrium: Right atrial size was normal in size. Pericardium: There is no evidence of pericardial effusion. Mitral Valve: S/p mTEER with 3 XTW clips (implant 07/16/2023), mild mitral regurgitation, possible mild stenosis (Mean gradient 6  mmHG, MVA (VTI) 1.6cm2, RVSP , HR 91bpm). Consider a repeat limited echocardiogarm when heart rate better controlled. There is a Mitra-Clip present in the mitral position. Procedure Date: 07/16/2023. MV peak gradient, 14.7 mmHg. The mean mitral valve gradient is 6.0 mmHg. Tricuspid Valve: The tricuspid valve is normal in structure. Tricuspid valve regurgitation is mild . No evidence of tricuspid stenosis. Aortic Valve: The aortic valve is tricuspid. Aortic valve regurgitation is mild. No aortic stenosis is present. Aortic valve peak gradient measures 5.7 mmHg. Pulmonic Valve: The pulmonic valve was normal in structure. Pulmonic valve regurgitation is not visualized. No evidence of pulmonic stenosis. Aorta: The aortic root and ascending aorta are structurally normal, with no evidence of dilitation. Venous: The inferior vena cava is normal in size with greater than 50% respiratory variability, suggesting right atrial pressure of 3 mmHg. IAS/Shunts: The atrial septum is grossly normal. Additional Comments: 3D was performed not requiring image post processing on an independent workstation and was abnormal.  LEFT VENTRICLE PLAX 2D LVIDd:         4.60 cm         Diastology LVIDs:         3.60 cm         LV e' medial:   8.38 cm/s LV PW:         1.20 cm         LV E/e' medial: 14.9 LV IVS:        1.40 cm LVOT diam:     2.60 cm LV SV:         70              3D Volume EF LV SV Index:   32              LV 3D EF:    Left LVOT Area:     5.31 cm                     ventricul                                             ar                                             ejection  fraction                                             by 3D                                             volume is                                             40 %.                                 3D Volume EF:                                3D EF:        40 %                                LV EDV:       199 ml                                 LV ESV:       120 ml                                LV SV:        79 ml RIGHT VENTRICLE             IVC RV S prime:     18.20 cm/s  IVC diam: 1.10 cm TAPSE (M-mode): 3.0 cm LEFT ATRIUM              Index        RIGHT ATRIUM           Index LA diam:        3.30 cm  1.52 cm/m   RA Area:     21.00 cm LA Vol (A2C):   104.0 ml 48.03 ml/m  RA Volume:   60.05 ml  27.73 ml/m LA Vol (A4C):   84.9 ml  39.21 ml/m LA Biplane Vol: 100.0 ml 46.18 ml/m  AORTIC VALVE AV Area (Vmax): 4.39 cm AV Vmax:        119.50 cm/s AV Peak Grad:   5.7 mmHg LVOT Vmax:      98.70 cm/s LVOT Vmean:     61.967 cm/s LVOT VTI:       0.132 m  AORTA Ao Root diam: 3.50 cm Ao Asc diam:  3.30 cm MITRAL VALVE                TRICUSPID VALVE MV Area (PHT): 2.87 cm     TR Peak grad:   23.6 mmHg MV Area VTI:   1.63 cm     TR Vmax:        243.00 cm/s MV Peak grad:  14.7 mmHg MV  Mean grad:  6.0 mmHg     SHUNTS MV Vmax:       1.92 m/s     Systemic VTI:  0.13 m MV Vmean:      116.0 cm/s   Systemic Diam: 2.60 cm MV Decel Time: 264 msec MV E velocity: 125.00 cm/s MV A velocity: 175.00 cm/s MV E/A ratio:  0.71 Sunit Tolia Electronically signed by Tessa Lerner Signature Date/Time: 08/08/2023/1:47:10 PM    Final    IR Fluoro Guide CV Line Right Result Date: 08/08/2023 INDICATION: ESRD, requiring HD. EXAM: TUNNELED CENTRAL VENOUS HEMODIALYSIS CATHETER PLACEMENT WITH ULTRASOUND AND FLUOROSCOPIC GUIDANCE MEDICATIONS: Ancef 2 gm IV. The antibiotic was given in an appropriate time interval prior to skin puncture. ANESTHESIA/SEDATION: Moderate (conscious) sedation was employed during this procedure. A total of Versed 1 mg and Fentanyl 25 mcg was administered intravenously. Moderate Sedation Time: 15 minutes. The patient's level of consciousness and vital signs were monitored continuously by radiology nursing throughout the procedure under my direct supervision. FLUOROSCOPY TIME:  Fluoroscopic dose; 1 mGy COMPLICATIONS: None immediate.  PROCEDURE: Informed written consent was obtained from the patient after a discussion of the risks, benefits, and alternatives to treatment. Questions regarding the procedure were encouraged and answered. The RIGHT neck and chest were prepped with chlorhexidine in a sterile fashion, and a sterile drape was applied covering the operative field. Maximum barrier sterile technique with sterile gowns and gloves were used for the procedure. A timeout was performed prior to the initiation of the procedure. After creating a small venotomy incision, a micropuncture kit was utilized to access the internal jugular vein. Real-time ultrasound guidance was utilized for vascular access including the acquisition of a permanent ultrasound image documenting patency of the accessed vessel. The microwire was utilized to measure appropriate catheter length. A stiff Glidewire was advanced to the level of the IVC and the micropuncture sheath was exchanged for a peel-away sheath. A palindrome tunneled hemodialysis catheter measuring 23 cm from tip to cuff was tunneled in a retrograde fashion from the anterior chest wall to the venotomy incision. The catheter was then placed through the peel-away sheath with tips ultimately positioned within the superior aspect of the right atrium. Final catheter positioning was confirmed and documented with a spot radiographic image. The catheter aspirates and flushes normally. The catheter was flushed with appropriate volume heparin dwells. The catheter exit site was secured with a 0-Prolene retention suture. The venotomy incision was closed with Dermabond. Dressings were applied. The patient tolerated the procedure well without immediate post procedural complication. IMPRESSION: Successful placement of 23 cm tip to cuff tunneled hemodialysis catheter via the RIGHT internal jugular vein. The tip of the catheter is positioned within the proximal RIGHT atriumat the superior cavo-atrial junction. The catheter  is ready for immediate use. Roanna Banning, MD [facility] Radiology Electronically Signed   By: Roanna Banning M.D.   On: 08/08/2023 13:46   IR US Guide Vasc Access Right Result Date: 08/08/2023 INDICATION: ESRD, requiring HD. EXAM: TUNNELED CENTRAL VENOUS HEMODIALYSIS CATHETER PLACEMENT WITH ULTRASOUND AND FLUOROSCOPIC GUIDANCE MEDICATIONS: Ancef 2 gm IV. The antibiotic was given in an appropriate time interval prior to skin puncture. ANESTHESIA/SEDATION: Moderate (conscious) sedation was employed during this procedure. A total of Versed 1 mg and Fentanyl 25 mcg was administered intravenously. Moderate Sedation Time: 15 minutes. The patient's level of consciousness and vital signs were monitored continuously by radiology nursing throughout the procedure under my direct supervision. FLUOROSCOPY TIME:  Fluoroscopic dose;  1 mGy COMPLICATIONS: None immediate. PROCEDURE: Informed written consent was obtained from the patient after a discussion of the risks, benefits, and alternatives to treatment. Questions regarding the procedure were encouraged and answered. The RIGHT neck and chest were prepped with chlorhexidine in a sterile fashion, and a sterile drape was applied covering the operative field. Maximum barrier sterile technique with sterile gowns and gloves were used for the procedure. A timeout was performed prior to the initiation of the procedure. After creating a small venotomy incision, a micropuncture kit was utilized to access the internal jugular vein. Real-time ultrasound guidance was utilized for vascular access including the acquisition of a permanent ultrasound image documenting patency of the accessed vessel. The microwire was utilized to measure appropriate catheter length. A stiff Glidewire was advanced to the level of the IVC and the micropuncture sheath was exchanged for a peel-away sheath. A palindrome tunneled hemodialysis catheter measuring 23 cm  from tip to cuff was tunneled in a retrograde fashion from the anterior chest wall to the venotomy incision. The catheter was then placed through the peel-away sheath with tips ultimately positioned within the superior aspect of the right atrium. Final catheter positioning was confirmed and documented with a spot radiographic image. The catheter aspirates and flushes normally. The catheter was flushed with appropriate volume heparin dwells. The catheter exit site was secured with a 0-Prolene retention suture. The venotomy incision was closed with Dermabond. Dressings were applied. The patient tolerated the procedure well without immediate post procedural complication. IMPRESSION: Successful placement of 23 cm tip to cuff tunneled hemodialysis catheter via the RIGHT internal jugular vein. The tip of the catheter is positioned within the proximal RIGHT atriumat the superior cavo-atrial junction. The catheter is ready for immediate use. Roanna Banning, MD [facility] Radiology Electronically Signed   By: Roanna Banning M.D.   On: 08/08/2023 13:46   DG Abd 2 Views Result Date: 08/07/2023 CLINICAL DATA:  101717 Nausea 101717 EXAM: ABDOMEN - 2 VIEW COMPARISON:  07/31/2023 FINDINGS: The bowel gas pattern is normal. Enteric contrast is present within the colon and rectum. There is no evidence of free air. No radio-opaque calculi or other significant radiographic abnormality is seen. IMPRESSION: Negative. Electronically Signed   By: Duanne Guess D.O.   On: 08/07/2023 15:00    Recent Labs    08/08/23 0438 08/09/23 0549  WBC 7.9 7.2  HGB 10.5* 11.0*  HCT 33.4* 34.6*  PLT 291 228   Recent Labs    08/08/23 0438 08/09/23 0549  NA 137 137  K 4.3 4.0  CL 100 98  CO2 23 26  GLUCOSE 96 96  BUN 49* 36*  CREATININE 6.21* 4.66*  CALCIUM 8.5* 8.5*    Intake/Output Summary (Last 24 hours) at 08/09/2023 0856 Last data filed at 08/09/2023 0746 Gross per 24 hour   Intake 960 ml  Output 1850 ml  Net -890 ml     Pressure Injury 07/23/23 Sacrum Mid Stage 2 -  Partial thickness loss of dermis presenting as a shallow open injury with a red, pink wound bed without slough. light pink (Active)  07/23/23 1200  Location: Sacrum  Location Orientation: Mid  Staging: Stage 2 -  Partial thickness loss of dermis presenting as a shallow open injury with a red, pink wound bed without slough.  Wound Description (Comments): light pink  Present on Admission: Yes    Physical Exam: Vital Signs Blood pressure 138/86, pulse 98, temperature 98.4 F (36.9 C), resp. rate  18, height 6\' 4"  (1.93 m), weight 86.6 kg, SpO2 100%.   General: NAD, appropriate appearance.  Laying in bed. Pleasant  Heart: RRR Lungs: Clear to auscultation, breathing unlabored, no rales or wheezes Abdomen: Positive bowel sounds, soft nontender to palpation, nondistended Extremities: No clubbing, cyanosis, or edema Skin: No evidence of breakdown, no evidence of rash,  R internal jugular catheter with adjacent incisional line and open area near right armpit, without active drainage.   Neurologic: Cranial nerves II through XII intact, motor strength is 5/5 in bilateral deltoid, bicep, tricep, grip, 4/5 hip flexor, knee extensors, ankle dorsiflexor and plantar flexor Sensory exam normal sensation to light touch in bilateral upper and lower extremities Musculoskeletal: Full range of motion in all 4 extremities. No joint swelling  Neurological:     Comments: Alert and oriented x 3, follows commands Unchanged 3-29  Assessment/Plan: 1. Functional deficits which require 3+ hours per day of interdisciplinary therapy in a comprehensive inpatient rehab setting. Physiatrist is providing close team supervision and 24 hour management of active medical problems listed below. Physiatrist and rehab team continue to assess barriers to discharge/monitor patient progress toward functional and medical  goals  Care Tool:  Bathing    Body parts bathed by patient: Right arm, Left arm, Chest, Abdomen, Front perineal area, Buttocks, Right upper leg, Left upper leg, Face   Body parts bathed by helper: Right lower leg, Left lower leg Body parts n/a: Right lower leg, Left lower leg   Bathing assist Assist Level: Contact Guard/Touching assist     Upper Body Dressing/Undressing Upper body dressing   What is the patient wearing?: Pull over shirt    Upper body assist Assist Level: Set up assist    Lower Body Dressing/Undressing Lower body dressing      What is the patient wearing?: Underwear/pull up, Pants     Lower body assist Assist for lower body dressing: Contact Guard/Touching assist     Toileting Toileting    Toileting assist Assist for toileting: Contact Guard/Touching assist     Transfers Chair/bed transfer  Transfers assist     Chair/bed transfer assist level: Contact Guard/Touching assist     Locomotion Ambulation   Ambulation assist      Assist level: Minimal Assistance - Patient > 75% Assistive device: Walker-rolling Max distance: 55   Walk 10 feet activity   Assist     Assist level: Minimal Assistance - Patient > 75% Assistive device: Walker-rolling   Walk 50 feet activity   Assist    Assist level: Minimal Assistance - Patient > 75% Assistive device: Walker-rolling    Walk 150 feet activity   Assist Walk 150 feet activity did not occur: Safety/medical concerns (fatigue)         Walk 10 feet on uneven surface  activity   Assist Walk 10 feet on uneven surfaces activity did not occur: Safety/medical concerns (fatigue)         Wheelchair     Assist Is the patient using a wheelchair?: Yes Type of Wheelchair: Manual    Wheelchair assist level: Dependent - Patient 0%      Wheelchair 50 feet with 2 turns activity    Assist        Assist Level: Dependent - Patient 0%   Wheelchair 150 feet activity      Assist      Assist Level: Dependent - Patient 0%   Blood pressure 138/86, pulse 98, temperature 98.4 F (36.9 C), resp. rate 18, height 6'  4" (1.93 m), weight 86.6 kg, SpO2 100%.  Medical Problem List and Plan: 1. 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  Antithrombotics: -DVT/anticoagulation:  Pharmaceutical: Heparin transitioning to Eliquis after [facility] HD catheter placed- pharmacy consult was placed today             -antiplatelet therapy: Aspirin 81 mg daily 3. Pain Management: Oxycodone as needed 4. Mood/Behavior/Sleep: Provide emotional support             -antipsychotic agents: N/A  -3/25 melatonin as needed ordered for insomnia  -improved with melatonin   5. Neuropsych/cognition: This patient is capable of making decisions on his own behalf. 6. Skin/Wound Care: Routine skin checks  -3-29: Asked nursing to place Mepilex on open area of right shoulder incision.   7. Fluids/Electrolytes/Nutrition: Routine in and outs with follow-up chemistries 8.  Cardiogenic shock/acute systolic congestive heart failure/A-fib/severe MR/SVT/PEA arrest.  ROSC x 40 minutes.  Follow-up cardiology services status post ECMO with Impella, decannulated 3/8 and Impella removed and extubated 3/12-reintubated but eventually extubated again 3/16.  Currently on amiodarone 200 mg daily  -3/25 HF team, cardiology plans to stop Amio before discharge.  Appreciate assistance  3/27 wt a little lower after dialysis yesterday  3/29: weight stable/downtrending    Filed Weights   08/08/23 1252 08/08/23 1628 08/09/23 0558  Weight: 87.1 kg 87.1 kg 86.6 kg    9.  Acute kidney injury  due to ATN/hyperkalemia.  Follow-up renal services.  Required CRRT transition to hemodialysis  - 3/27 figured after HD yesterday, nephrology ordering tunneled cath, he is NPO for his  3/28 [facility] today, need temporary HD cath removed  10.  Normocytic anemia.  Follow-up CBC.  Continue Aranesp  -3/27 HGB stable at 10.7  Aranexp was decreased to every Friday by renal 11.  Transaminitis.  Likely shock liver.  Follow-up chemistries 12.  Decreased nutritional storage.  Dietary follow-up.  Follow-up MBS.  -3/25 by nutrition today 13.  Hypothyroidism.  Continue Tapazole 10 mg daily.  Need to check TSH in 4 weeks. 14.  Nausea, likely dialysis associated  -Will spread out medications.  Amiodarone may be contributing to this.  -2/27 will check abd xray- no acute findings 15. Dysphagia -Continue SLP   16.  Constipation  -3/27 spent a lot of time on the commode trying to have a bowel movement, check abdomen x-ray and schedule MiraLAX  -3/28 LBM yesterday large  17. Hyperglycemia?  -CBGs stable, Discontinue checks   -3/29: BG well controlled  18. Hyperphosphatemia   - - 7.2 --> 5.8 3/29   - consider renvela 0.8 mg TID if downtrend does not continue    LOS: 5 days A FACE TO FACE EVALUATION WAS PERFORMED  Angelina Sheriff 08/09/2023, 8:56 AM

## 2023-08-09 NOTE — Progress Notes (Signed)
 [facility] NEPHROLOGY PROGRESS NOTE  Assessment/ Plan: Pt is a 38 y.o. yo male  likely ATN after cardiac arrest, cardiogenic shock on ECMO ( decan 3/8) and Impella ( removed 3/12), AHRF/VDRF.  # Dialysis dependent anuric AKI 2/2 ATN from cardiogenic shock on CRRT 07/11/23 - 07/26/23, Left subclavian temp HD cath placed by CCM; Transitioned to [facility] on 3/17.   -s/p RIJ TDC by IR on 08/08/23.  - removed temporary dialysis catheter   -consulted renal navigator to look for outpatient HD for AKI. -MWF, next HD on Monday.   # Cardiogenic Shock,  Impella removed; status post transcutaneous mitral valve repair; previously on ECMO. Cardiology supportive of transition to rehab.   # VT + PEA SCA, as per cardiology   # PE - on heparin per primary team  - we have consulted IR for tunneled catheter placement  - once his hemodialysis tunneled catheter is placed we can transition to eliquis per cardiology/medicine inpatient team previous plans   # Normocytic Anemia -  on aranesp - reduce dose to 100 mcg every Friday    # Hyperkalemia - on HD.  Changed to renal diet    # Severe MR s/p TEER 3/5  Subjective: Seen and examined.  The temporary HD catheter was removed yesterday.  Denies nausea, vomiting, chest pain, shortness of breath.  No new event.  His fianc was presented with him and was sleeping.  Objective Vital signs in last 24 hours: Vitals:   08/08/23 1930 08/08/23 2025 08/09/23 0558 08/09/23 0748  BP: 128/81  138/86   Pulse: 90  98   Resp: 18  18   Temp: 98.2 F (36.8 C)  98.4 F (36.9 C)   TempSrc:      SpO2: 100% 98% 100% 100%  Weight:   86.6 kg   Height:       Weight change: 1.1 kg  Intake/Output Summary (Last 24 hours) at 08/09/2023 1117 Last data filed at 08/09/2023 0746 Gross per 24 hour  Intake 960 ml  Output 1850 ml  Net -890 ml       Labs: RENAL PANEL Recent Labs  Lab 08/03/23 0401 08/04/23 0425 08/05/23 0500 08/06/23 0559 08/07/23 0415  08/08/23 0438 08/09/23 0549  NA 130*   < > 132* 133* 134* 137 137  K 5.5*   < > 4.5 4.6 4.0 4.3 4.0  CL 94*   < > 94* 96* 95* 100 98  CO2 21*   < > 23 20* 26 23 26   GLUCOSE 91   < > 92 86 91 96 96  BUN 94*   < > 65* 72* 36* 49* 36*  CREATININE 9.15*   < > 7.73* 8.12* 5.66* 6.21* 4.66*  CALCIUM 8.2*   < > 7.9* 8.4* 8.6* 8.5* 8.5*  MG 2.2  --   --   --   --   --   --   PHOS  --   --   --  7.7* 6.9* 7.2* 5.8*  ALBUMIN 2.5*  --  2.3* 2.6* 2.6* 2.6* 2.6*   < > = values in this interval not displayed.    Liver Function Tests: Recent Labs  Lab 08/03/23 0401 08/05/23 0500 08/06/23 0559 08/07/23 0415 08/08/23 0438 08/09/23 0549  AST 39 33  --   --   --   --   ALT 65* 47*  --   --   --   --   ALKPHOS 296* 211*  --   --   --   --  BILITOT 0.9 0.7  --   --   --   --   PROT 6.6 6.2*  --   --   --   --   ALBUMIN 2.5* 2.3*   < > 2.6* 2.6* 2.6*   < > = values in this interval not displayed.   No results for input(s): "LIPASE", "AMYLASE" in the last 168 hours. No results for input(s): "AMMONIA" in the last 168 hours. CBC: Recent Labs    08/05/23 0500 08/06/23 0559 08/07/23 0415 08/08/23 0438 08/09/23 0549  HGB 9.5* 10.6* 10.7* 10.5* 11.0*  MCV 95.2 92.5 94.8 94.9 93.8    Cardiac Enzymes: No results for input(s): "CKTOTAL", "CKMB", "CKMBINDEX", "TROPONINI" in the last 168 hours. CBG: Recent Labs  Lab 08/08/23 0611 08/08/23 1205 08/08/23 1826 08/08/23 2105 08/09/23 0600  GLUCAP 94 159* 159* 158* 93    Iron Studies: No results for input(s): "IRON", "TIBC", "TRANSFERRIN", "FERRITIN" in the last 72 hours. Studies/Results: ECHOCARDIOGRAM COMPLETE Result Date: 08/08/2023    ECHOCARDIOGRAM REPORT   Patient Name:   JOSEP LUVIANO Date of Exam: 08/08/2023 Medical Rec #:  [id number]    Height:       76.0 in Accession #:    [id number]   Weight:       189.6 lb Date of Birth:  1986/01/21    BSA:          2.165 m Patient Age:    37 years     BP:           122/74 mmHg Patient Gender: M             HR:           97 bpm. Exam Location:  Inpatient Procedure: 2D Echo, 3D Echo, Cardiac Doppler and Color Doppler (Both Spectral            and Color Flow Doppler were utilized during procedure). 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 Previous Myocardial Infarction,                 Arrythmias:Atrial Fibrillation; Signs/Symptoms:Chest Pain.                  Mitral Valve: Mitra-Clip valve is present in the mitral                 position. Procedure Date: 07/16/2023.  Sonographer:    Lucendia Herrlich RCS Referring Phys: [id number] KATHRYN R THOMPSON IMPRESSIONS  1. Left ventricular ejection fraction, by estimation, is 40 to 45%. Left ventricular ejection fraction by 3D volume is 40 %. The left ventricle has mildly decreased function. The left ventricle demonstrates global hypokinesis. There is mild left ventricular hypertrophy. Left ventricular diastolic parameters are consistent with Grade II diastolic dysfunction (pseudonormalization).  2. Right ventricular systolic function is normal. The right ventricular size is normal. There is normal pulmonary artery systolic pressure. The estimated right ventricular systolic pressure is 26.6 mmHg.  3. Left atrial size was moderately dilated.  4. S/p mTEER with 3 XTW clips (implant 07/16/2023), mild mitral regurgitation, possible mild stenosis (Mean gradient 6 mmHG, MVA (VTI) 1.6cm2, RVSP , HR 91bpm). Consider a repeat limited echocardiogarm when heart rate better controlled. . There is  a Mitra-Clip present in the mitral position. Procedure Date: 07/16/2023.  5.  The aortic valve is tricuspid. Aortic valve regurgitation is mild. No aortic stenosis is present.  6. The inferior vena cava is normal in size with greater than 50% respiratory variability, suggesting right atrial pressure of 3 mmHg. FINDINGS  Left Ventricle: Left ventricular ejection fraction, by  estimation, is 40 to 45%. Left ventricular ejection fraction by 3D volume is 40 %. The left ventricle has mildly decreased function. The left ventricle demonstrates global hypokinesis. The left ventricular internal cavity size was normal in size. There is mild left ventricular hypertrophy. Left ventricular diastolic parameters are consistent with Grade II diastolic dysfunction (pseudonormalization). Right Ventricle: The right ventricular size is normal. No increase in right ventricular wall thickness. Right ventricular systolic function is normal. There is normal pulmonary artery systolic pressure. The tricuspid regurgitant velocity is 2.43 m/s, and  with an assumed right atrial pressure of 3 mmHg, the estimated right ventricular systolic pressure is 26.6 mmHg. Left Atrium: Left atrial size was moderately dilated. Right Atrium: Right atrial size was normal in size. Pericardium: There is no evidence of pericardial effusion. Mitral Valve: S/p mTEER with 3 XTW clips (implant 07/16/2023), mild mitral regurgitation, possible mild stenosis (Mean gradient 6 mmHG, MVA (VTI) 1.6cm2, RVSP , HR 91bpm). Consider a repeat limited echocardiogarm when heart rate better controlled. There is a Mitra-Clip present in the mitral position. Procedure Date: 07/16/2023. MV peak gradient, 14.7 mmHg. The mean mitral valve gradient is 6.0 mmHg. Tricuspid Valve: The tricuspid valve is normal in structure. Tricuspid valve regurgitation is mild . No evidence of tricuspid stenosis. Aortic Valve: The aortic valve is tricuspid. Aortic valve regurgitation is mild. No aortic stenosis is present. Aortic valve peak gradient measures 5.7 mmHg. Pulmonic Valve: The pulmonic valve was normal in structure. Pulmonic valve regurgitation is not visualized. No evidence of pulmonic stenosis. Aorta: The aortic root and ascending aorta are structurally normal, with no evidence of dilitation. Venous: The inferior vena cava is normal in size with greater than  50% respiratory variability, suggesting right atrial pressure of 3 mmHg. IAS/Shunts: The atrial septum is grossly normal. Additional Comments: 3D was performed not requiring image post processing on an independent workstation and was abnormal.  LEFT VENTRICLE PLAX 2D LVIDd:         4.60 cm         Diastology LVIDs:         3.60 cm         LV e' medial:   8.38 cm/s LV PW:         1.20 cm         LV E/e' medial: 14.9 LV IVS:        1.40 cm LVOT diam:     2.60 cm LV SV:         70              3D Volume EF LV SV Index:   32              LV 3D EF:    Left LVOT Area:     5.31 cm                     ventricul                                             ar  ejection                                             fraction                                             by 3D                                             volume is                                             40 %.                                 3D Volume EF:                                3D EF:        40 %                                LV EDV:       199 ml                                LV ESV:       120 ml                                LV SV:        79 ml RIGHT VENTRICLE             IVC RV S prime:     18.20 cm/s  IVC diam: 1.10 cm TAPSE (M-mode): 3.0 cm LEFT ATRIUM              Index        RIGHT ATRIUM           Index LA diam:        3.30 cm  1.52 cm/m   RA Area:     21.00 cm LA Vol (A2C):   104.0 ml 48.03 ml/m  RA Volume:   60.05 ml  27.73 ml/m LA Vol (A4C):   84.9 ml  39.21 ml/m LA Biplane Vol: 100.0 ml 46.18 ml/m  AORTIC VALVE AV Area (Vmax): 4.39 cm AV Vmax:        119.50 cm/s AV Peak Grad:   5.7 mmHg LVOT Vmax:      98.70 cm/s LVOT Vmean:     61.967 cm/s LVOT VTI:       0.132 m  AORTA Ao Root diam: 3.50 cm Ao Asc diam:  3.30 cm MITRAL VALVE                TRICUSPID VALVE MV  Area (PHT): 2.87 cm     TR Peak grad:   23.6 mmHg MV Area VTI:   1.63 cm     TR Vmax:        243.00 cm/s MV Peak grad:  14.7 mmHg MV  Mean grad:  6.0 mmHg     SHUNTS MV Vmax:       1.92 m/s     Systemic VTI:  0.13 m MV Vmean:      116.0 cm/s   Systemic Diam: 2.60 cm MV Decel Time: 264 msec MV E velocity: 125.00 cm/s MV A velocity: 175.00 cm/s MV E/A ratio:  0.71 Sunit Tolia Electronically signed by Tessa Lerner Signature Date/Time: 08/08/2023/1:47:10 PM    Final    IR Fluoro Guide CV Line Right Result Date: 08/08/2023 INDICATION: ESRD, requiring HD. EXAM: TUNNELED CENTRAL VENOUS HEMODIALYSIS CATHETER PLACEMENT WITH ULTRASOUND AND FLUOROSCOPIC GUIDANCE MEDICATIONS: Ancef 2 gm IV. The antibiotic was given in an appropriate time interval prior to skin puncture. ANESTHESIA/SEDATION: Moderate (conscious) sedation was employed during this procedure. A total of Versed 1 mg and Fentanyl 25 mcg was administered intravenously. Moderate Sedation Time: 15 minutes. The patient's level of consciousness and vital signs were monitored continuously by radiology nursing throughout the procedure under my direct supervision. FLUOROSCOPY TIME:  Fluoroscopic dose; 1 mGy COMPLICATIONS: None immediate. PROCEDURE: Informed written consent was obtained from the patient after a discussion of the risks, benefits, and alternatives to treatment. Questions regarding the procedure were encouraged and answered. The RIGHT neck and chest were prepped with chlorhexidine in a sterile fashion, and a sterile drape was applied covering the operative field. Maximum barrier sterile technique with sterile gowns and gloves were used for the procedure. A timeout was performed prior to the initiation of the procedure. After creating a small venotomy incision, a micropuncture kit was utilized to access the internal jugular vein. Real-time ultrasound guidance was utilized for vascular access including the acquisition of a permanent ultrasound image documenting patency of the accessed vessel. The microwire was utilized to measure appropriate catheter length. A stiff Glidewire was advanced to  the level of the IVC and the micropuncture sheath was exchanged for a peel-away sheath. A palindrome tunneled hemodialysis catheter measuring 23 cm from tip to cuff was tunneled in a retrograde fashion from the anterior chest wall to the venotomy incision. The catheter was then placed through the peel-away sheath with tips ultimately positioned within the superior aspect of the right atrium. Final catheter positioning was confirmed and documented with a spot radiographic image. The catheter aspirates and flushes normally. The catheter was flushed with appropriate volume heparin dwells. The catheter exit site was secured with a 0-Prolene retention suture. The venotomy incision was closed with Dermabond. Dressings were applied. The patient tolerated the procedure well without immediate post procedural complication. IMPRESSION: Successful placement of 23 cm tip to cuff tunneled hemodialysis catheter via the RIGHT internal jugular vein. The tip of the catheter is positioned within the proximal RIGHT atriumat the superior cavo-atrial junction. The catheter is ready for immediate use. Roanna Banning, MD [facility] Radiology Electronically Signed   By: Roanna Banning M.D.   On: 08/08/2023 13:46   IR US Guide Vasc Access Right Result Date: 08/08/2023 INDICATION: ESRD, requiring HD. EXAM: TUNNELED CENTRAL VENOUS HEMODIALYSIS CATHETER PLACEMENT WITH ULTRASOUND AND FLUOROSCOPIC GUIDANCE MEDICATIONS: Ancef 2 gm IV. The antibiotic was given in an appropriate time interval prior to skin puncture. ANESTHESIA/SEDATION: Moderate (conscious) sedation was employed during this procedure.  A total of Versed 1 mg and Fentanyl 25 mcg was administered intravenously. Moderate Sedation Time: 15 minutes. The patient's level of consciousness and vital signs were monitored continuously by radiology nursing throughout the procedure under my direct supervision. FLUOROSCOPY TIME:  Fluoroscopic dose; 1  mGy COMPLICATIONS: None immediate. PROCEDURE: Informed written consent was obtained from the patient after a discussion of the risks, benefits, and alternatives to treatment. Questions regarding the procedure were encouraged and answered. The RIGHT neck and chest were prepped with chlorhexidine in a sterile fashion, and a sterile drape was applied covering the operative field. Maximum barrier sterile technique with sterile gowns and gloves were used for the procedure. A timeout was performed prior to the initiation of the procedure. After creating a small venotomy incision, a micropuncture kit was utilized to access the internal jugular vein. Real-time ultrasound guidance was utilized for vascular access including the acquisition of a permanent ultrasound image documenting patency of the accessed vessel. The microwire was utilized to measure appropriate catheter length. A stiff Glidewire was advanced to the level of the IVC and the micropuncture sheath was exchanged for a peel-away sheath. A palindrome tunneled hemodialysis catheter measuring 23 cm from tip to cuff was tunneled in a retrograde fashion from the anterior chest wall to the venotomy incision. The catheter was then placed through the peel-away sheath with tips ultimately positioned within the superior aspect of the right atrium. Final catheter positioning was confirmed and documented with a spot radiographic image. The catheter aspirates and flushes normally. The catheter was flushed with appropriate volume heparin dwells. The catheter exit site was secured with a 0-Prolene retention suture. The venotomy incision was closed with Dermabond. Dressings were applied. The patient tolerated the procedure well without immediate post procedural complication. IMPRESSION: Successful placement of 23 cm tip to cuff tunneled hemodialysis catheter via the RIGHT internal jugular vein. The tip of the catheter is positioned within the proximal RIGHT atriumat the superior  cavo-atrial junction. The catheter is ready for immediate use. Roanna Banning, MD [facility] Radiology Electronically Signed   By: Roanna Banning M.D.   On: 08/08/2023 13:46    Medications: Infusions:    Scheduled Medications:  apixaban  5 mg Oral BID   arformoterol  15 mcg Nebulization BID   ascorbic acid  250 mg Oral BID   budesonide (PULMICORT) nebulizer solution  0.25 mg Nebulization BID   Chlorhexidine Gluconate Cloth  6 each Topical Q12H   Chlorhexidine Gluconate Cloth  6 each Topical Q0600   darbepoetin (ARANESP) injection - DIALYSIS  100 mcg Subcutaneous Q Fri-1800   feeding supplement (NEPRO CARB STEADY)  237 mL Oral Q24H   folic acid  1 mg Oral Daily   methimazole  10 mg Oral Daily   multivitamin  1 tablet Oral QHS   polyethylene glycol  17 g Oral Daily   revefenacin  175 mcg Nebulization Daily   sodium chloride flush  10-40 mL Intracatheter Q12H   thiamine  100 mg Oral Daily    have reviewed scheduled and prn medications.  Physical Exam: General:NAD, comfortable Heart:RRR, s1s2 nl Lungs:clear b/l, no crackle Abdomen:soft, Non-tender, non-distended Extremities:No edema Dialysis Access: Right IJ TDC in place, left-sided temporary HD catheter will be removed today.  Tanishka Drolet Prasad Betti Goodenow 08/09/2023,11:17 AM  LOS: 5 days

## 2023-08-10 DIAGNOSIS — N170 Acute kidney failure with tubular necrosis: Secondary | ICD-10-CM | POA: Diagnosis not present

## 2023-08-10 DIAGNOSIS — R5381 Other malaise: Secondary | ICD-10-CM | POA: Diagnosis not present

## 2023-08-10 DIAGNOSIS — R079 Chest pain, unspecified: Secondary | ICD-10-CM | POA: Diagnosis not present

## 2023-08-10 DIAGNOSIS — J9601 Acute respiratory failure with hypoxia: Secondary | ICD-10-CM | POA: Diagnosis not present

## 2023-08-10 DIAGNOSIS — R570 Cardiogenic shock: Secondary | ICD-10-CM | POA: Diagnosis not present

## 2023-08-10 DIAGNOSIS — R0602 Shortness of breath: Secondary | ICD-10-CM | POA: Diagnosis not present

## 2023-08-10 DIAGNOSIS — E872 Acidosis, unspecified: Secondary | ICD-10-CM | POA: Diagnosis not present

## 2023-08-10 DIAGNOSIS — D649 Anemia, unspecified: Secondary | ICD-10-CM | POA: Diagnosis not present

## 2023-08-10 DIAGNOSIS — E875 Hyperkalemia: Secondary | ICD-10-CM | POA: Diagnosis not present

## 2023-08-10 DIAGNOSIS — I469 Cardiac arrest, cause unspecified: Secondary | ICD-10-CM | POA: Diagnosis not present

## 2023-08-10 LAB — CBC
HCT: 34.7 % — ABNORMAL LOW (ref 39.0–52.0)
Hemoglobin: 10.9 g/dL — ABNORMAL LOW (ref 13.0–17.0)
MCH: 29.5 pg (ref 26.0–34.0)
MCHC: 31.4 g/dL (ref 30.0–36.0)
MCV: 94 fL (ref 80.0–100.0)
Platelets: 220 10*3/uL (ref 150–400)
RBC: 3.69 MIL/uL — ABNORMAL LOW (ref 4.22–5.81)
RDW: 14.6 % (ref 11.5–15.5)
WBC: 7.3 10*3/uL (ref 4.0–10.5)
nRBC: 0 % (ref 0.0–0.2)

## 2023-08-10 LAB — RENAL FUNCTION PANEL
Albumin: 2.7 g/dL — ABNORMAL LOW (ref 3.5–5.0)
Anion gap: 14 (ref 5–15)
BUN: 36 mg/dL — ABNORMAL HIGH (ref 6–20)
CO2: 20 mmol/L — ABNORMAL LOW (ref 22–32)
Calcium: 8.6 mg/dL — ABNORMAL LOW (ref 8.9–10.3)
Chloride: 104 mmol/L (ref 98–111)
Creatinine, Ser: 4.27 mg/dL — ABNORMAL HIGH (ref 0.61–1.24)
GFR, Estimated: 17 mL/min — ABNORMAL LOW (ref >60)
Glucose, Bld: 134 mg/dL — ABNORMAL HIGH (ref 70–99)
Phosphorus: 5.6 mg/dL — ABNORMAL HIGH (ref 2.5–4.6)
Potassium: 4.2 mmol/L (ref 3.5–5.1)
Sodium: 138 mmol/L (ref 135–145)

## 2023-08-10 NOTE — Plan of Care (Signed)
 Updated goals 2/2 pt progressing quicker than anticipated Problem: RH Balance Goal: LTG Patient will maintain dynamic standing balance (PT) Description: LTG:  Patient will maintain dynamic standing balance with assistance during mobility activities (PT) Flowsheets (Taken 08/10/2023 1347) LTG: Pt will maintain dynamic standing balance during mobility activities with:: Independent   Problem: RH Car Transfers Goal: LTG Patient will perform car transfers with assist (PT) Description: LTG: Patient will perform car transfers with assistance (PT). Flowsheets (Taken 08/10/2023 1347) LTG: Pt will perform car transfers with assist:: Independent   Problem: RH Furniture Transfers Goal: LTG Patient will perform furniture transfers w/assist (OT/PT) Description: LTG: Patient will perform furniture transfers  with assistance (OT/PT). Flowsheets (Taken 08/10/2023 1347) LTG: Pt will perform furniture transfers with assist:: Independent   Problem: RH Ambulation Goal: LTG Patient will ambulate in controlled environment (PT) Description: LTG: Patient will ambulate in a controlled environment, # of feet with assistance (PT). Flowsheets Taken 08/10/2023 1347 LTG: Pt will ambulate in controlled environ  assist needed:: Independent Taken 08/05/2023 1630 LTG: Ambulation distance in controlled environment: 150 feet with LRAD Goal: LTG Patient will ambulate in home environment (PT) Description: LTG: Patient will ambulate in home environment, # of feet with assistance (PT). Flowsheets Taken 08/10/2023 1347 LTG: Pt will ambulate in home environ  assist needed:: Independent Taken 08/05/2023 1630 LTG: Ambulation distance in home environment: 75 feet with LRAD Goal: LTG Patient will ambulate in community environment (PT) Description: LTG: Patient will ambulate in community environment, # of feet with assistance (PT). Flowsheets (Taken 08/10/2023 1347) LTG: Pt will ambulate in community environ  assist needed::  Independent with assistive device LTG: Ambulation distance in community environment: 150 feet with LRAD

## 2023-08-10 NOTE — Progress Notes (Signed)
 PROGRESS NOTE   Subjective/Complaints:  Vitals stable.  No events overnight. Patient seen sitting up in therapy gym, states he is feeling pretty good today.  Sleeping about 4 to 5 hours per night.  He endorses that his schedule is generally later to bed, and that current therapy schedules do not fit his normal sleep/wake cycle.  Does feel overall lethargy is improving.  Right shoulder appears healing.  ROS: Patient denies fever, new vision changes,  diarrhea,  vomiting,  or chest pain, headache, or mood change. + Difficulty sleeping  Objective:   ECHOCARDIOGRAM COMPLETE Result Date: 08/08/2023    ECHOCARDIOGRAM REPORT   Patient Name:   Aaron [name] Date of Exam: 08/08/2023 Medical Rec #:  [id number]    Height:       76.0 in Accession #:    [id number]   Weight:       189.6 lb Date of Birth:  [birth year]    BSA:          2.165 m Patient Age:    38 years     BP:           122/74 mmHg Patient Gender: M            HR:           97 bpm. Exam Location:  Inpatient Procedure: 2D Echo, 3D Echo, Cardiac Doppler and Color Doppler (Both Spectral            and Color Flow Doppler were utilized during procedure). 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 Previous Myocardial Infarction,                 Arrythmias:Atrial Fibrillation; Signs/Symptoms:Chest Pain.                  Mitral Valve: Mitra-Clip valve is present in the mitral                 position. Procedure Date: 07/16/2023.  Sonographer:    Lucendia Herrlich RCS Referring Phys: [id number] KATHRYN R THOMPSON IMPRESSIONS  1. Left ventricular ejection fraction, by estimation, is 40 to 45%. Left ventricular ejection fraction by 3D volume is 40 %. The left ventricle has mildly decreased function. The left ventricle demonstrates global hypokinesis. There is mild left ventricular hypertrophy. Left  ventricular diastolic parameters are consistent with Grade II diastolic dysfunction (pseudonormalization).  2. Right ventricular systolic function is normal. The right ventricular size is normal. There is normal pulmonary artery systolic pressure. The estimated right ventricular systolic pressure is 26.6 mmHg.  3. Left atrial size was moderately dilated.  4. S/p mTEER with 3 XTW clips (implant 07/16/2023), mild mitral regurgitation, possible mild stenosis (Mean gradient 6 mmHG, MVA (VTI) 1.6cm2, RVSP , HR 91bpm). Consider a repeat limited echocardiogarm when heart rate better controlled. . There is  a Mitra-Clip present in the mitral position. Procedure Date: 07/16/2023.  5. The aortic valve is tricuspid. Aortic valve regurgitation is mild. No aortic stenosis  is present.  6. The inferior vena cava is normal in size with greater than 50% respiratory variability, suggesting right atrial pressure of 3 mmHg. FINDINGS  Left Ventricle: Left ventricular ejection fraction, by estimation, is 40 to 45%. Left ventricular ejection fraction by 3D volume is 40 %. The left ventricle has mildly decreased function. The left ventricle demonstrates global hypokinesis. The left ventricular internal cavity size was normal in size. There is mild left ventricular hypertrophy. Left ventricular diastolic parameters are consistent with Grade II diastolic dysfunction (pseudonormalization). Right Ventricle: The right ventricular size is normal. No increase in right ventricular wall thickness. Right ventricular systolic function is normal. There is normal pulmonary artery systolic pressure. The tricuspid regurgitant velocity is 2.43 m/s, and  with an assumed right atrial pressure of 3 mmHg, the estimated right ventricular systolic pressure is 26.6 mmHg. Left Atrium: Left atrial size was moderately dilated. Right Atrium: Right atrial size was normal in size. Pericardium: There is no evidence of pericardial effusion. Mitral Valve: S/p mTEER  with 3 XTW clips (implant 07/16/2023), mild mitral regurgitation, possible mild stenosis (Mean gradient 6 mmHG, MVA (VTI) 1.6cm2, RVSP , HR 91bpm). Consider a repeat limited echocardiogarm when heart rate better controlled. There is a Mitra-Clip present in the mitral position. Procedure Date: 07/16/2023. MV peak gradient, 14.7 mmHg. The mean mitral valve gradient is 6.0 mmHg. Tricuspid Valve: The tricuspid valve is normal in structure. Tricuspid valve regurgitation is mild . No evidence of tricuspid stenosis. Aortic Valve: The aortic valve is tricuspid. Aortic valve regurgitation is mild. No aortic stenosis is present. Aortic valve peak gradient measures 5.7 mmHg. Pulmonic Valve: The pulmonic valve was normal in structure. Pulmonic valve regurgitation is not visualized. No evidence of pulmonic stenosis. Aorta: The aortic root and ascending aorta are structurally normal, with no evidence of dilitation. Venous: The inferior vena cava is normal in size with greater than 50% respiratory variability, suggesting right atrial pressure of 3 mmHg. IAS/Shunts: The atrial septum is grossly normal. Additional Comments: 3D was performed not requiring image post processing on an independent workstation and was abnormal.  LEFT VENTRICLE PLAX 2D LVIDd:         4.60 cm         Diastology LVIDs:         3.60 cm         LV e' medial:   8.38 cm/s LV PW:         1.20 cm         LV E/e' medial: 14.9 LV IVS:        1.40 cm LVOT diam:     2.60 cm LV SV:         70              3D Volume EF LV SV Index:   32              LV 3D EF:    Left LVOT Area:     5.31 cm                     ventricul                                             ar  ejection                                             fraction                                             by 3D                                             volume is                                             40 %.                                 3D Volume  EF:                                3D EF:        40 %                                LV EDV:       199 ml                                LV ESV:       120 ml                                LV SV:        79 ml RIGHT VENTRICLE             IVC RV S prime:     18.20 cm/s  IVC diam: 1.10 cm TAPSE (M-mode): 3.0 cm LEFT ATRIUM              Index        RIGHT ATRIUM           Index LA diam:        3.30 cm  1.52 cm/m   RA Area:     21.00 cm LA Vol (A2C):   104.0 ml 48.03 ml/m  RA Volume:   60.05 ml  27.73 ml/m LA Vol (A4C):   84.9 ml  39.21 ml/m LA Biplane Vol: 100.0 ml 46.18 ml/m  AORTIC VALVE AV Area (Vmax): 4.39 cm AV Vmax:        119.50 cm/s AV Peak Grad:   5.7 mmHg LVOT Vmax:      98.70 cm/s LVOT Vmean:     61.967 cm/s LVOT VTI:       0.132 m  AORTA Ao Root diam: 3.50 cm Ao Asc diam:  3.30 cm MITRAL VALVE                TRICUSPID VALVE  MV Area (PHT): 2.87 cm     TR Peak grad:   23.6 mmHg MV Area VTI:   1.63 cm     TR Vmax:        243.00 cm/s MV Peak grad:  14.7 mmHg MV Mean grad:  6.0 mmHg     SHUNTS MV Vmax:       1.92 m/s     Systemic VTI:  0.13 m MV Vmean:      116.0 cm/s   Systemic Diam: 2.60 cm MV Decel Time: 264 msec MV E velocity: 125.00 cm/s MV A velocity: 175.00 cm/s MV E/A ratio:  0.71 Sunit Tolia Electronically signed by Tessa Lerner Signature Date/Time: 08/08/2023/1:47:10 PM    Final     Recent Labs    08/09/23 0549 08/10/23 0736  WBC 7.2 7.3  HGB 11.0* 10.9*  HCT 34.6* 34.7*  PLT 228 220   Recent Labs    08/09/23 0549 08/10/23 0736  NA 137 138  K 4.0 4.2  CL 98 104  CO2 26 20*  GLUCOSE 96 134*  BUN 36* 36*  CREATININE 4.66* 4.27*  CALCIUM 8.5* 8.6*    Intake/Output Summary (Last 24 hours) at 08/10/2023 1017 Last data filed at 08/10/2023 0900 Gross per 24 hour  Intake 773 ml  Output 1925 ml  Net -1152 ml     Pressure Injury 07/23/23 Sacrum Mid Stage 2 -  Partial thickness loss of dermis presenting as a shallow open injury with a red, pink wound bed without slough. light  pink (Active)  07/23/23 1200  Location: Sacrum  Location Orientation: Mid  Staging: Stage 2 -  Partial thickness loss of dermis presenting as a shallow open injury with a red, pink wound bed without slough.  Wound Description (Comments): light pink  Present on Admission: Yes    Physical Exam: Vital Signs Blood pressure 132/85, pulse 96, temperature 98.1 F (36.7 C), resp. rate 19, height 6\' 4"  (1.93 m), weight 85.6 kg, SpO2 99%.   General: NAD, appropriate appearance.  Sitting up in therapy gym. Pleasant, appropriate. Heart: RRR.  No murmurs, rubs, or gallops. Lungs: Clear to auscultation, breathing unlabored, no rales or wheezes Abdomen: Positive bowel sounds, soft nontender to palpation, nondistended Extremities: No clubbing, cyanosis, or edema Skin: No evidence of breakdown, no evidence of rash,  R internal jugular catheter with adjacent incisional line and open area near right armpit, without active drainage--healing  Neurologic: Cranial nerves II through XII intact, motor strength is 5/5 in bilateral deltoid, bicep, tricep, grip, 4/5 hip flexor, knee extensors, ankle dorsiflexor and plantar flexor Sensory exam normal sensation to light touch in bilateral upper and lower extremities Musculoskeletal: Full range of motion in all 4 extremities. No joint swelling  Neurological:     Comments: Alert and oriented x 3, follows commands.  No apparent cognitive deficits. Unchanged 3-30  Assessment/Plan: 1. Functional deficits which require 3+ hours per day of interdisciplinary therapy in a comprehensive inpatient rehab setting. Physiatrist is providing close team supervision and 24 hour management of active medical problems listed below. Physiatrist and rehab team continue to assess barriers to discharge/monitor patient progress toward functional and medical goals  Care Tool:  Bathing    Body parts bathed by patient: Right arm, Left arm, Chest, Abdomen, Front perineal area,  Buttocks, Right upper leg, Left upper leg, Face   Body parts bathed by helper: Right lower leg, Left lower leg Body parts n/a: Right lower leg, Left lower leg   Bathing assist Assist  Level: Contact Guard/Touching assist     Upper Body Dressing/Undressing Upper body dressing   What is the patient wearing?: Pull over shirt    Upper body assist Assist Level: Set up assist    Lower Body Dressing/Undressing Lower body dressing      What is the patient wearing?: Underwear/pull up, Pants     Lower body assist Assist for lower body dressing: Contact Guard/Touching assist     Toileting Toileting    Toileting assist Assist for toileting: Contact Guard/Touching assist     Transfers Chair/bed transfer  Transfers assist     Chair/bed transfer assist level: Contact Guard/Touching assist     Locomotion Ambulation   Ambulation assist      Assist level: Minimal Assistance - Patient > 75% Assistive device: Walker-rolling Max distance: 55   Walk 10 feet activity   Assist     Assist level: Minimal Assistance - Patient > 75% Assistive device: Walker-rolling   Walk 50 feet activity   Assist    Assist level: Minimal Assistance - Patient > 75% Assistive device: Walker-rolling    Walk 150 feet activity   Assist Walk 150 feet activity did not occur: Safety/medical concerns (fatigue)         Walk 10 feet on uneven surface  activity   Assist Walk 10 feet on uneven surfaces activity did not occur: Safety/medical concerns (fatigue)         Wheelchair     Assist Is the patient using a wheelchair?: Yes Type of Wheelchair: Manual    Wheelchair assist level: Dependent - Patient 0%      Wheelchair 50 feet with 2 turns activity    Assist        Assist Level: Dependent - Patient 0%   Wheelchair 150 feet activity     Assist      Assist Level: Dependent - Patient 0%   Blood pressure 132/85, pulse 96, temperature 98.1 F (36.7 C),  resp. rate 19, height 6\' 4"  (1.93 m), weight 85.6 kg, SpO2 99%.  Medical Problem List and Plan: 1. 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  Antithrombotics: -DVT/anticoagulation:  Pharmaceutical: Heparin transitioning to Eliquis after [facility] HD catheter placed- pharmacy consult was placed today             -antiplatelet therapy: Aspirin 81 mg daily 3. Pain Management: Oxycodone as needed 4. Mood/Behavior/Sleep: Provide emotional support             -antipsychotic agents: N/A  -3/25 melatonin as needed ordered for insomnia  -improved with melatonin   3-30: Patient reports he is sleeping about 4 to 5 hours a night, has not been using as needed melatonin.  Encouraged use, patient does not want any medication scheduled.    5. Neuropsych/cognition: This patient is capable of making decisions on his own behalf. 6. Skin/Wound Care: Routine skin checks  -3-29: Asked nursing to place Mepilex on open area of right shoulder incision.  3-30: Wounds mostly healed per nursing, stage II on sacrum resolved   7. Fluids/Electrolytes/Nutrition: Routine in and outs with follow-up chemistries 8.  Cardiogenic  shock/acute systolic congestive heart failure/A-fib/severe MR/SVT/PEA arrest.  ROSC x 40 minutes.  Follow-up cardiology services status post ECMO with Impella, decannulated 3/8 and Impella removed and extubated 3/12-reintubated but eventually extubated again 3/16.  Currently on amiodarone 200 mg daily  -3/25 HF team, cardiology plans to stop Amio before discharge.  Appreciate assistance  3/27 wt a little lower after dialysis yesterday  3/29: weight stable/downtrending    Filed  Weights   08/08/23 1628 08/09/23 0558 08/10/23 0307  Weight: 87.1 kg 86.6 kg 85.6 kg    9.  Acute kidney injury due to ATN/hyperkalemia.  Follow-up renal services.  Required CRRT transition to hemodialysis  - 3/27 figured after HD yesterday, nephrology ordering tunneled cath, he is NPO for his  3/28 [facility] today, need temporary HD cath removed  10.  Normocytic anemia.  Follow-up CBC.  Continue Aranesp  -3/27 HGB stable at 10.7  Aranexp was decreased to every Friday by renal 11.  Transaminitis.  Likely shock liver.  Follow-up chemistries 12.  Decreased nutritional storage.  Dietary follow-up.  Follow-up MBS.  -3/25 by nutrition today 13.  Hypothyroidism.  Continue Tapazole 10 mg daily.  Need to check TSH in 4 weeks. 14.  Nausea, likely dialysis associated  -Will spread out medications.  Amiodarone may be contributing to this.  -2/27 will check abd xray- no acute findings 15. Dysphagia -Continue SLP   16.  Constipation  -3/27 spent a lot of time on the commode trying to have a bowel movement, check abdomen x-ray and schedule MiraLAX  -3/28 LBM yesterday large  17. Hyperglycemia?  -CBGs stable, Discontinue checks   -3/29-30: BG well controlled  18. Hyperphosphatemia   - - 7.2 --> 5.8 3/29   - consider renvela 0.8 mg TID if downtrend does not continue  -3-30: Labs with Phos 5.6; appreciate nephrology assistance in management, since naturally downtrending will hold off of treatment at this time    LOS: 6 days A FACE TO FACE EVALUATION WAS PERFORMED  Angelina Sheriff 08/10/2023, 10:17 AM

## 2023-08-10 NOTE — Progress Notes (Signed)
 Stage 2 to sacrum is healed. Scar tissue noted to area. Notified Md -removed from avatar.   Tilden Dome, LPN

## 2023-08-10 NOTE — Progress Notes (Signed)
 Occupational Therapy Session Note  Patient Details  Name: Aaron [name] MRN: [id number] Date of Birth: 28-Oct-1985  Today's Date: 08/10/2023 OT Individual Time: 1115-1200 OT Individual Time Calculation (min): 45 min    Short Term Goals: Week 1:  OT Short Term Goal 1 (Week 1): Pt will perform LB ADLs with Mod A + LRAD at EOB. OT Short Term Goal 2 (Week 1): Pt will perform toilet transfer with Min A + LRAD. OT Short Term Goal 3 (Week 1): Pt will tolerate standing activities >2 mins with Min A + LRAD.  Skilled Therapeutic Interventions/Progress Updates:      Therapy Documentation Precautions:  Precautions Precautions: Fall Precaution/Restrictions Comments: Monitor vitals; HD MWF; Painful L ankle fx from 2010, mild L hemiparesis Restrictions Weight Bearing Restrictions Per Provider Order: No RLE Weight Bearing Per Provider Order: Non weight bearing LLE Weight Bearing Per Provider Order: Non weight bearing General: "I love sports games" Pt seated in chair upon OT arrival, agreeable to OT. Fiance present  Pain: unrated soreness reported in Lt arm activity, intermittent rest breaks, distractions provided for pain management, pt reports tolerable to proceed.   ADL: OT initiating functional mobility for increased balance strategies and endurance. pt ambulating from room><therapy gym with SBA and no AE, no LOB  Exercises: Pt completed the following exercise circuit in order to improve functional activity, strength and endurance to prepare for ADLs such as bathing. Pt completed the following exercises in seated position with no noted LOB/SOB and 3x10 repetitions on each exercise: -bicep curls -triceps extensions -shoulder abduction -forward punches -shoulder flexion -Pt provided with PRN rest breaks between sets d/t increased fatigue    Pt seated in W/C at end of session with W/C alarm donned, call light within reach and 4Ps assessed.    Therapy/Group: Individual Therapy  Velia Meyer, OTD, OTR/L 08/10/2023, 12:59 PM

## 2023-08-10 NOTE — Progress Notes (Addendum)
 [name] NEPHROLOGY PROGRESS NOTE  Assessment/ Plan: Pt is a 38 y.o. yo male  likely ATN after cardiac arrest, cardiogenic shock on ECMO ( decan 3/8) and Impella ( removed 3/12), AHRF/VDRF.  # Dialysis dependent anuric AKI 2/2 ATN from cardiogenic shock on CRRT 07/11/23 - 07/26/23, Left subclavian temp HD cath placed by CCM; Transitioned to [facility] on 3/17.   -s/p RIJ TDC by IR on 08/08/23.  - removed temporary dialysis catheter   -consulted renal navigator to look for outpatient HD for AKI. -Noted urine output has increased to 2 L and creatinine level trending down.  Showing sign of renal recovery therefore we will check lab tomorrow morning before deciding dialysis.   # Cardiogenic Shock,  Impella removed; status post transcutaneous mitral valve repair; previously on ECMO. Cardiology supportive of transition to rehab.   # VT + PEA SCA, as per cardiology   # PE - on heparin per primary team  - we have consulted IR for tunneled catheter placement  - once his hemodialysis tunneled catheter is placed we can transition to eliquis per cardiology/medicine inpatient team previous plans   # Normocytic Anemia -  on aranesp - reduce dose to 100 mcg every Friday    # Hyperkalemia - on HD.  Changed to renal diet    # Severe MR s/p TEER 3/5  Subjective: Seen and examined.  No new event.  Urine output is around 2 L.  During inpatient rehab well.  Denies nausea, vomiting, chest pain, shortness of breath.   Objective Vital signs in last 24 hours: Vitals:   08/10/23 0306 08/10/23 0307 08/10/23 0840 08/10/23 0841  BP: 132/85     Pulse: 96     Resp: 19     Temp: 98.1 F (36.7 C)     TempSrc:      SpO2: 100%  99% 99%  Weight:  85.6 kg    Height:       Weight change: -1.5 kg  Intake/Output Summary (Last 24 hours) at 08/10/2023 1129 Last data filed at 08/10/2023 0900 Gross per 24 hour  Intake 773 ml  Output 1925 ml  Net -1152 ml       Labs: RENAL PANEL Recent Labs  Lab  08/06/23 0559 08/07/23 0415 08/08/23 0438 08/09/23 0549 08/10/23 0736  NA 133* 134* 137 137 138  K 4.6 4.0 4.3 4.0 4.2  CL 96* 95* 100 98 104  CO2 20* 26 23 26  20*  GLUCOSE 86 91 96 96 134*  BUN 72* 36* 49* 36* 36*  CREATININE 8.12* 5.66* 6.21* 4.66* 4.27*  CALCIUM 8.4* 8.6* 8.5* 8.5* 8.6*  PHOS 7.7* 6.9* 7.2* 5.8* 5.6*  ALBUMIN 2.6* 2.6* 2.6* 2.6* 2.7*    Liver Function Tests: Recent Labs  Lab 08/05/23 0500 08/06/23 0559 08/08/23 0438 08/09/23 0549 08/10/23 0736  AST 33  --   --   --   --   ALT 47*  --   --   --   --   ALKPHOS 211*  --   --   --   --   BILITOT 0.7  --   --   --   --   PROT 6.2*  --   --   --   --   ALBUMIN 2.3*   < > 2.6* 2.6* 2.7*   < > = values in this interval not displayed.   No results for input(s): "LIPASE", "AMYLASE" in the last 168 hours. No results for input(s): "AMMONIA" in  the last 168 hours. CBC: Recent Labs    08/06/23 0559 08/07/23 0415 08/08/23 0438 08/09/23 0549 08/10/23 0736  HGB 10.6* 10.7* 10.5* 11.0* 10.9*  MCV 92.5 94.8 94.9 93.8 94.0    Cardiac Enzymes: No results for input(s): "CKTOTAL", "CKMB", "CKMBINDEX", "TROPONINI" in the last 168 hours. CBG: Recent Labs  Lab 08/08/23 0611 08/08/23 1205 08/08/23 1826 08/08/23 2105 08/09/23 0600  GLUCAP 94 159* 159* 158* 93    Iron Studies: No results for input(s): "IRON", "TIBC", "TRANSFERRIN", "FERRITIN" in the last 72 hours. Studies/Results: ECHOCARDIOGRAM COMPLETE Result Date: 08/08/2023    ECHOCARDIOGRAM REPORT   Patient Name:   Aaron [name] Date of Exam: 08/08/2023 Medical Rec #:  [id number]    Height:       76.0 in Accession #:    [id number]   Weight:       189.6 lb Date of Birth:  June 17, 1985    BSA:          2.165 m Patient Age:    37 years     BP:           122/74 mmHg Patient Gender: M            HR:           97 bpm. Exam Location:  Inpatient Procedure: 2D Echo, 3D Echo, Cardiac Doppler and Color Doppler (Both Spectral            and Color Flow Doppler were utilized  during procedure). 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 Previous Myocardial Infarction,                 Arrythmias:Atrial Fibrillation; Signs/Symptoms:Chest Pain.                  Mitral Valve: Mitra-Clip valve is present in the mitral                 position. Procedure Date: 07/16/2023.  Sonographer:    Lucendia Herrlich RCS Referring Phys: [id number] KATHRYN R THOMPSON IMPRESSIONS  1. Left ventricular ejection fraction, by estimation, is 40 to 45%. Left ventricular ejection fraction by 3D volume is 40 %. The left ventricle has mildly decreased function. The left ventricle demonstrates global hypokinesis. There is mild left ventricular hypertrophy. Left ventricular diastolic parameters are consistent with Grade II diastolic dysfunction (pseudonormalization).  2. Right ventricular systolic function is normal. The right ventricular size is normal. There is normal pulmonary artery systolic pressure. The estimated right ventricular systolic pressure is 26.6 mmHg.  3. Left atrial size was moderately dilated.  4. S/p mTEER with 3 XTW clips (implant 07/16/2023), mild mitral regurgitation, possible mild stenosis (Mean gradient 6 mmHG, MVA (VTI) 1.6cm2, RVSP , HR 91bpm). Consider a repeat limited echocardiogarm when heart rate better controlled. . There is  a Mitra-Clip present in the mitral position. Procedure Date: 07/16/2023.  5. The aortic valve is tricuspid. Aortic valve regurgitation is mild. No aortic stenosis is present.  6. The inferior vena cava is normal in size with greater than 50% respiratory variability, suggesting right atrial pressure of 3 mmHg. FINDINGS  Left Ventricle: Left ventricular ejection fraction, by estimation, is 40 to 45%. Left ventricular ejection fraction by 3D volume is 40 %. The left ventricle has mildly decreased function. The left ventricle  demonstrates global  hypokinesis. The left ventricular internal cavity size was normal in size. There is mild left ventricular hypertrophy. Left ventricular diastolic parameters are consistent with Grade II diastolic dysfunction (pseudonormalization). Right Ventricle: The right ventricular size is normal. No increase in right ventricular wall thickness. Right ventricular systolic function is normal. There is normal pulmonary artery systolic pressure. The tricuspid regurgitant velocity is 2.43 m/s, and  with an assumed right atrial pressure of 3 mmHg, the estimated right ventricular systolic pressure is 26.6 mmHg. Left Atrium: Left atrial size was moderately dilated. Right Atrium: Right atrial size was normal in size. Pericardium: There is no evidence of pericardial effusion. Mitral Valve: S/p mTEER with 3 XTW clips (implant 07/16/2023), mild mitral regurgitation, possible mild stenosis (Mean gradient 6 mmHG, MVA (VTI) 1.6cm2, RVSP , HR 91bpm). Consider a repeat limited echocardiogarm when heart rate better controlled. There is a Mitra-Clip present in the mitral position. Procedure Date: 07/16/2023. MV peak gradient, 14.7 mmHg. The mean mitral valve gradient is 6.0 mmHg. Tricuspid Valve: The tricuspid valve is normal in structure. Tricuspid valve regurgitation is mild . No evidence of tricuspid stenosis. Aortic Valve: The aortic valve is tricuspid. Aortic valve regurgitation is mild. No aortic stenosis is present. Aortic valve peak gradient measures 5.7 mmHg. Pulmonic Valve: The pulmonic valve was normal in structure. Pulmonic valve regurgitation is not visualized. No evidence of pulmonic stenosis. Aorta: The aortic root and ascending aorta are structurally normal, with no evidence of dilitation. Venous: The inferior vena cava is normal in size with greater than 50% respiratory variability, suggesting right atrial pressure of 3 mmHg. IAS/Shunts: The atrial septum is grossly normal. Additional Comments: 3D was performed not requiring  image post processing on an independent workstation and was abnormal.  LEFT VENTRICLE PLAX 2D LVIDd:         4.60 cm         Diastology LVIDs:         3.60 cm         LV e' medial:   8.38 cm/s LV PW:         1.20 cm         LV E/e' medial: 14.9 LV IVS:        1.40 cm LVOT diam:     2.60 cm LV SV:         70              3D Volume EF LV SV Index:   32              LV 3D EF:    Left LVOT Area:     5.31 cm                     ventricul                                             ar                                             ejection  fraction                                             by 3D                                             volume is                                             40 %.                                 3D Volume EF:                                3D EF:        40 %                                LV EDV:       199 ml                                LV ESV:       120 ml                                LV SV:        79 ml RIGHT VENTRICLE             IVC RV S prime:     18.20 cm/s  IVC diam: 1.10 cm TAPSE (M-mode): 3.0 cm LEFT ATRIUM              Index        RIGHT ATRIUM           Index LA diam:        3.30 cm  1.52 cm/m   RA Area:     21.00 cm LA Vol (A2C):   104.0 ml 48.03 ml/m  RA Volume:   60.05 ml  27.73 ml/m LA Vol (A4C):   84.9 ml  39.21 ml/m LA Biplane Vol: 100.0 ml 46.18 ml/m  AORTIC VALVE AV Area (Vmax): 4.39 cm AV Vmax:        119.50 cm/s AV Peak Grad:   5.7 mmHg LVOT Vmax:      98.70 cm/s LVOT Vmean:     61.967 cm/s LVOT VTI:       0.132 m  AORTA Ao Root diam: 3.50 cm Ao Asc diam:  3.30 cm MITRAL VALVE                TRICUSPID VALVE MV Area (PHT): 2.87 cm     TR Peak grad:   23.6 mmHg MV Area VTI:   1.63 cm     TR Vmax:        243.00 cm/s MV Peak grad:  14.7 mmHg MV Mean  grad:  6.0 mmHg     SHUNTS MV Vmax:       1.92 m/s     Systemic VTI:  0.13 m MV Vmean:      116.0 cm/s   Systemic Diam: 2.60 cm MV Decel Time: 264 msec MV E velocity:  125.00 cm/s MV A velocity: 175.00 cm/s MV E/A ratio:  0.71 Sunit Tolia Electronically signed by [employer] Signature Date/Time: 08/08/2023/1:47:10 PM    Final     Medications: Infusions:    Scheduled Medications:  apixaban  5 mg Oral BID   arformoterol  15 mcg Nebulization BID   ascorbic acid  250 mg Oral BID   budesonide (PULMICORT) nebulizer solution  0.25 mg Nebulization BID   Chlorhexidine Gluconate Cloth  6 each Topical Q12H   Chlorhexidine Gluconate Cloth  6 each Topical Q0600   darbepoetin (ARANESP) injection - DIALYSIS  100 mcg Subcutaneous Q Fri-1800   feeding supplement (NEPRO CARB STEADY)  237 mL Oral Q24H   folic acid  1 mg Oral Daily   methimazole  10 mg Oral Daily   multivitamin  1 tablet Oral QHS   polyethylene glycol  17 g Oral Daily   revefenacin  175 mcg Nebulization Daily   sodium chloride flush  10-40 mL Intracatheter Q12H   thiamine  100 mg Oral Daily    have reviewed scheduled and prn medications.  Physical Exam: General:NAD, comfortable Heart:RRR, s1s2 nl Lungs:clear b/l, no crackle Abdomen:soft, Non-tender, non-distended Extremities:No edema Dialysis Access: Right IJ TDC in place, left-sided temporary HD catheter was removed  Delta Pichon Prasad Denelle Capurro 08/10/2023,11:29 AM  LOS: 6 days

## 2023-08-10 NOTE — Progress Notes (Signed)
 Physical Therapy Session Note  Patient Details  Name: Aaron [name] MRN: [id number] Date of Birth: [birth year]  Today's Date: 08/10/2023 PT Individual Time: 1002-1047, 0272-5366 PT Individual Time Calculation (min): 45 min, 43 min   Short Term Goals: Week 1:  PT Short Term Goal 1 (Week 1): Pt will ambulate 150 feet with LRAD and supervision PT Short Term Goal 2 (Week 1): pt will perform outcome measure assessment PT Short Term Goal 3 (Week 1): pt will perform stand pivot transfer with LRAD and supervision  Skilled Therapeutic Interventions/Progress Updates:     Pt supine in bed upon arrival. Pt agreeable to therapy. Pt denies any pain.   Pt reports fatigue 2/2 lack of sleep last night. Pt requesting to stay in bed during this session and will get OOB during afternoon sessions.   Pt performed the following therex in bed for B LE strengthening/coordination/ROM, activity tolerance/endurance, verbal cues provided for technique/completion within available range, and compensation reduction:   1x10 glute bridges holding for 5 seconds at peak --pt reports difficulty activating L LE  1x10 single leg glute bridge B holding for 5 seconds  1x10 dead bug   Pt donned shorts while in bed mod I.  Supine to sit mod I  Donned shoes and socks sitting EOB with supervision utilizing figure 4 technique--noted significant posterior bias for donning L LE Stand pivot transfer bed to [facility]  Patient demonstrates increased fall risk as noted by score of   45/56 on Berg Balance Scale.  (<36= high risk for falls, close to 100%; 37-45 significant >80%; 46-51 moderate >50%; 52-55 lower >25%). Education provided on significant fall risk, and discussed POC for improving pt overall balance, and independence. Pt verbalized understanding and agreeable.   Discussed importance of pt sitting OOB betweeen sessions for overall improved activity tolerance. Pt agreeable to sit in WC between sessions.   Pt seated in [facility] with chair  alarm on and all needs within reach.   Treatment Session 2   Pt supine in bed upon arrival. Pt agreeable to therapy. Pt denies any pain but reports fatigue. Pt reports he stayed up in chair between PT and OT session but went to bed after OT session 2/2 fatigue.   Education provided regarding pt debility and fatigue and importance of continuing to participate in therapy for improved activity tolerance/endurance.  Pt verbalized understanding and agreeable.   Treatment Session focused on endurance/activity tolerance/neuromuscular reeducation/dynamic balance/ balance recovery-hip/ankle strategy and stepping strategy:   Pt ambulated room <> main gym <> ortho gym <> main gym <> room with no AD and CGA/close supervision, intermittent verbal cues provided for heel/toe pattern,  and forward gaze. pt demos 1 episode of mild LOB however pt corrected with stepping strategy.   Pt required seated rest break upon entry to each room 2/2 SOB/fatigue. Pt provided seated rest break for management of SOB/fatigue.   Pt performed lateral stepping while ascending/descending ramp x2 with no UE support and CGA progressing to close supervision, verbal cues provided for increased LE clearance.   Pt navigated 12 6 inch stairs with no UE support and CGA/close supervision with reciprocal gait.   Pt performed 1x10 sit to stand with arms across chest and B UE on airex pad, with CGA, verbal cues provided for technique with emphasis on controlled descent.   Pt stood on airex pad x2 trials (first trial with feet together, 2nd trail with feet in semi tandem) while placing squigz on mirror in heart position with R UE  and removing with L UE, and CGA/close supervision.   Pt navigated hurdles with step to gait x5 leading with R LE, x5 leading with L LE, x10 with reciprocal gait and CGA/close supervision, verbal cues provided for increased technique.   Pt overall very pleased after this session and reports he feels much stronger.    Pt supine in bed at end of session with all needs within reach and bed alarm on.   Therapy Documentation Precautions:  Precautions Precautions: Fall Precaution/Restrictions Comments: Monitor vitals; HD MWF; Painful L ankle fx from 2010, mild L hemiparesis Restrictions Weight Bearing Restrictions Per Provider Order: No RLE Weight Bearing Per Provider Order: Non weight bearing LLE Weight Bearing Per Provider Order: Non weight bearing Balance: Standardized Balance Assessment Standardized Balance Assessment: Berg Balance Test Berg Balance Test Sit to Stand: Able to stand  independently using hands Standing Unsupported: Able to stand safely 2 minutes Sitting with Back Unsupported but Feet Supported on Floor or Stool: Able to sit safely and securely 2 minutes Stand to Sit: Controls descent by using hands Transfers: Able to transfer safely, definite need of hands Standing Unsupported with Eyes Closed: Able to stand 10 seconds with supervision Standing Ubsupported with Feet Together: Able to place feet together independently and stand for 1 minute with supervision From Standing, Reach Forward with Outstretched Arm: Can reach forward >12 cm safely (5") From Standing Position, Pick up Object from Floor: Able to pick up shoe, needs supervision From Standing Position, Turn to Look Behind Over each Shoulder: Looks behind from both sides and weight shifts well Turn 360 Degrees: Able to turn 360 degrees safely but slowly Standing Unsupported, Alternately Place Feet on Step/Stool: Able to stand independently and safely and complete 8 steps in 20 seconds Standing Unsupported, One Foot in Front: Able to place foot tandem independently and hold 30 seconds Standing on One Leg: Able to lift leg independently and hold equal to or more than 3 seconds Total Score: 45   Therapy/Group: Individual Therapy  [facility] [name], [place], DPT  08/10/2023, 3:41 PM

## 2023-08-10 NOTE — Progress Notes (Signed)
 Speech Language Pathology Daily Session Note  Patient Details  Name: Aaron [name] MRN: [id number] Date of Birth: 20-Jun-1985  Today's Date: 08/10/2023 SLP Individual Time: 0800-0900 SLP Individual Time Calculation (min): 60 min  Short Term Goals: Week 1: SLP Short Term Goal 1 (Week 1): Patient will utilize chin tuck during consumption of thin liquids given min multimodal A SLP Short Term Goal 2 (Week 1): Patient will demonstrate problem solving abilities in functional situations given min multimodal A SLP Short Term Goal 3 (Week 1): Patient will recall and utilize memory compensatory strategies given min multimodal A  Skilled Therapeutic Interventions:  Patient was seen for skilled ST services targeting cognition.  Patient was sitting in bed upon SLP arrival and was agreeable to ST treatment.  Patient reports slower processing speed; however, both him and his fiance feel like his thinking skills are close to baseline.  Patient was able to recall 2 out of 4 WRAP strategies learned in previous sessions re: Write it, Repeat it.  He was unable to recall A and P given max verbal cueing.  Patient admitted he had not reviewed the strategies since previous education.  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  Neysa Bonito reports that he does not recall information at home from their conversations as he is always on a video game.  SLP briefly educated on active listening and communication strategies re: eliminating distractions when having important conversations. Pt could benefit on additional communication strategy education. We discussed waiting until games finished ("it can't be interrupted") before having important conversations.  Patient and fianc report understanding.  At close in session, patient was able to recall 4/4 WRAP strategies independently.  Patient was left in room with safety measures activated and all immediate  needs within reach.  Continue with current ST POC.   Pain Pain Assessment Pain Scale: 0-10 Pain Score: 0-No pain  Therapy/Group: Individual Therapy  Ann Lions 08/10/2023, 12:34 PM

## 2023-08-11 DIAGNOSIS — N179 Acute kidney failure, unspecified: Secondary | ICD-10-CM | POA: Diagnosis not present

## 2023-08-11 DIAGNOSIS — R0602 Shortness of breath: Secondary | ICD-10-CM | POA: Diagnosis not present

## 2023-08-11 DIAGNOSIS — I63549 Cerebral infarction due to unspecified occlusion or stenosis of unspecified cerebellar artery: Secondary | ICD-10-CM

## 2023-08-11 DIAGNOSIS — R5381 Other malaise: Secondary | ICD-10-CM | POA: Diagnosis not present

## 2023-08-11 DIAGNOSIS — I472 Ventricular tachycardia, unspecified: Secondary | ICD-10-CM

## 2023-08-11 DIAGNOSIS — J9601 Acute respiratory failure with hypoxia: Secondary | ICD-10-CM | POA: Diagnosis not present

## 2023-08-11 DIAGNOSIS — D649 Anemia, unspecified: Secondary | ICD-10-CM | POA: Diagnosis not present

## 2023-08-11 DIAGNOSIS — E872 Acidosis, unspecified: Secondary | ICD-10-CM | POA: Diagnosis not present

## 2023-08-11 DIAGNOSIS — E875 Hyperkalemia: Secondary | ICD-10-CM | POA: Diagnosis not present

## 2023-08-11 DIAGNOSIS — I469 Cardiac arrest, cause unspecified: Secondary | ICD-10-CM | POA: Diagnosis not present

## 2023-08-11 DIAGNOSIS — R570 Cardiogenic shock: Secondary | ICD-10-CM | POA: Diagnosis not present

## 2023-08-11 DIAGNOSIS — R079 Chest pain, unspecified: Secondary | ICD-10-CM | POA: Diagnosis not present

## 2023-08-11 DIAGNOSIS — I5021 Acute systolic (congestive) heart failure: Secondary | ICD-10-CM

## 2023-08-11 DIAGNOSIS — N170 Acute kidney failure with tubular necrosis: Secondary | ICD-10-CM | POA: Diagnosis not present

## 2023-08-11 DIAGNOSIS — K5900 Constipation, unspecified: Secondary | ICD-10-CM | POA: Diagnosis not present

## 2023-08-11 HISTORY — DX: Cerebral infarction due to unspecified occlusion or stenosis of unspecified cerebellar artery: I63.549

## 2023-08-11 LAB — RENAL FUNCTION PANEL
Albumin: 2.8 g/dL — ABNORMAL LOW (ref 3.5–5.0)
Anion gap: 11 (ref 5–15)
BUN: 34 mg/dL — ABNORMAL HIGH (ref 6–20)
CO2: 22 mmol/L (ref 22–32)
Calcium: 8.6 mg/dL — ABNORMAL LOW (ref 8.9–10.3)
Chloride: 106 mmol/L (ref 98–111)
Creatinine, Ser: 3.87 mg/dL — ABNORMAL HIGH (ref 0.61–1.24)
GFR, Estimated: 19 mL/min — ABNORMAL LOW (ref >60)
Glucose, Bld: 88 mg/dL (ref 70–99)
Phosphorus: 5.3 mg/dL — ABNORMAL HIGH (ref 2.5–4.6)
Potassium: 3.8 mmol/L (ref 3.5–5.1)
Sodium: 139 mmol/L (ref 135–145)

## 2023-08-11 NOTE — Progress Notes (Signed)
 Contacted by rehab CSW regarding pt being ready for d/c earlier than expected (Wed or Thurs this week). Referral made to [facility] admissions on Friday and referral still pending. Nephrologist watching pt the next few days to see if HD will be needed at d/c. Requested that [facility] proceed with referral in the event pt requires HD at d/c so pt's d/c will not be delayed. Will assist as needed.   Olivia Canter Renal Navigator [phone]

## 2023-08-11 NOTE — Progress Notes (Signed)
 Patient ID: Aaron [name], male   DOB: 1985/12/05, 38 y.o.   MRN: [id number]     Advanced Heart Failure Rounding Note  Cardiologist: Christell Constant, MD  Chief Complaint: Deconditioning  Subjective:    Feels good this morning. Now on Eliquis. Plan for iHD today. Renal function continues to improve.   Significant events  - Admitted 2/26 with worsening SOB, CP and a fib with RVR. - Decompensated 2/27 with IVF, nodal blockade with subsequent respiratory arrest, ROSC achieved after ~16 minute down time - Femoral VA ecmo cannulation 2/27 with Impella CP vent - ECMO decannulation 3/8 - Impella removed and extubated 3/12.  Had to be reintubated with mucus plugging and left lung opacification.  - MRI head 3/13 with small infarcts right cerebral and cerebellar hemispheres - Re-extubated 3/16 - >CIR 3/24 - TDC by IR placed 08/08/23. Hep gtt stopped and started on Eliquis.   Objective:   Echo 08/01/23 EF 40-45% mild residual MR. RV ok   Weight Range: 84 kg Body mass index is 22.54 kg/m.   Vital Signs:   Temp:  [98.3 F (36.8 C)-98.5 F (36.9 C)] 98.3 F (36.8 C) (03/31 0455) Pulse Rate:  [91-103] 103 (03/31 0742) Resp:  [17-20] 18 (03/31 0742) BP: (124-128)/(81-82) 128/81 (03/31 0455) SpO2:  [98 %-100 %] 98 % (03/31 0742) Weight:  [84 kg] 84 kg (03/31 0453) Last BM Date : 08/11/23  Weight change: Filed Weights   08/09/23 0558 08/10/23 0307 08/11/23 0453  Weight: 86.6 kg 85.6 kg 84 kg    Intake/Output:   Intake/Output Summary (Last 24 hours) at 08/11/2023 0935 Last data filed at 08/11/2023 0745 Gross per 24 hour  Intake 365 ml  Output 1500 ml  Net -1135 ml    Physical Exam  General:  well appearing.  No respiratory difficulty Neck: supple. JVD ~6 cm. RIJ TDC Cor: PMI nondisplaced. Regular rate & rhythm. No rubs, gallops or murmurs. Lungs: clear Extremities: no cyanosis, clubbing, rash, edema  Neuro: alert & oriented x 3. Moves all 4 extremities w/o difficulty.  Affect pleasant.   Labs  CBC Recent Labs    08/09/23 0549 08/10/23 0736  WBC 7.2 7.3  HGB 11.0* 10.9*  HCT 34.6* 34.7*  MCV 93.8 94.0  PLT 228 220   Basic Metabolic Panel Recent Labs    64/40/34 0736 08/11/23 0602  NA 138 139  K 4.2 3.8  CL 104 106  CO2 20* 22  GLUCOSE 134* 88  BUN 36* 34*  CREATININE 4.27* 3.87*  CALCIUM 8.6* 8.6*  PHOS 5.6* 5.3*   Liver Function Tests Recent Labs    08/10/23 0736 08/11/23 0602  ALBUMIN 2.7* 2.8*   BNP: BNP (last 3 results) Recent Labs    07/09/23 1934 07/10/23 1224  BNP 703.7* 980.5*   Thyroid Function Tests No results for input(s): "TSH", "T4TOTAL", "T3FREE", "THYROIDAB" in the last 72 hours.  Invalid input(s): "FREET3"  Medications:   Scheduled Medications:  apixaban  5 mg Oral BID   arformoterol  15 mcg Nebulization BID   ascorbic acid  250 mg Oral BID   budesonide (PULMICORT) nebulizer solution  0.25 mg Nebulization BID   Chlorhexidine Gluconate Cloth  6 each Topical Q12H   Chlorhexidine Gluconate Cloth  6 each Topical Q0600   darbepoetin (ARANESP) injection - DIALYSIS  100 mcg Subcutaneous Q Fri-1800   feeding supplement (NEPRO CARB STEADY)  237 mL Oral Q24H   folic acid  1 mg Oral Daily   methimazole  10  mg Oral Daily   multivitamin  1 tablet Oral QHS   polyethylene glycol  17 g Oral Daily   revefenacin  175 mcg Nebulization Daily   sodium chloride flush  10-40 mL Intracatheter Q12H   thiamine  100 mg Oral Daily  Infusions:    PRN Medications: acetaminophen, Gerhardt's butt cream, lactulose, melatonin, oxyCODONE, polyethylene glycol, promethazine Patient Profile   Patient with a history of Grave's disease, severe primary mitral regurgitation who presented with chest pain and segmental PE. Subsequent progressed to SCAI Stage E shock requiring VA ECMO cannulation on 2/27.   Assessment/Plan  SCAI Stage E Cardiogenic shock - Suspect hypoxic respiratory failure driven with segmental PE on top of severe  MR, nodal blockage, and thyrotoxicosis - Emergent VA ECMO with Impella vent(decannulated 07/19/23) - Underwent successful mTEER on 07/16/23 with placement of x3 clips - Echo 3/10: EF 35-40%, anterior and septal HK, normal RV, s/p Mitraclip with mild MR and mean gradient 7 mmHg.  - Impella removed 3/12.  - Echo 08/01/23 EF 40-45% mild residual MR. RV ok  - Doing well. GDMT limited by AKI. Can add Bidil as tolerated.   Severe mitral valve regurgitation - Now s/p mTEER on 07/16/23; improvement in MR from severe to mild.  - Echo with mild residual MR  Pulmonary hypertension - Due to severe MR - Now much improved.   VT/NSVT - Initially with frequent runs of NSVT likely secondary to impella 5.5 position. - No further ectopy after impella was removed - Stable off mexiletine and amiodarone  Acute renal failure/hyperkalemia - Nephrology following. Now on iHD.  - Making urine but still requiring iHD. SCr/BUN improving - Now s/p TDC  Thyrotoxicosis - Was not taking medications as they made him feel poorly - Suspect underlying low output heart failure due to mitral valve disease - On methimazole 10 tid.   Atrial fibrillation - PAF in setting of shock. Now he remains in NSR - Stable off amio - Continue Eliquis  Pulmonary embolism:  - Segmental without right heart strain - Continue Eliquis. Denies abnormal bleeding. CBC stable.   CAD - Prior embolic infarct in the LAD territory with corresponding LGE on CMR - Lifelong anticoagulation - Off ASA with Eliquis  CVA - MRI head 3/13 with small infarcts right cerebral and cerebellar hemispheres. This should not significantly affect consciousness.  - Suspect embolic. No residual  We will continue to follow at a distance. Please call with questions.   Length of Stay: 7  Aaron Bleacher, NP  08/11/2023, 9:35 AM  Advanced Heart Failure Team Pager [phone] (M-F; 7a - 5p)  Please contact [facility] for night-coverage after hours (5p -7a ) and  weekends on [email or web address]

## 2023-08-11 NOTE — Consult Note (Signed)
 Neuropsychological Consultation Comprehensive Inpatient Rehab   Patient:   Aaron [name]   DOB:   02/17/1986  MR Number:  [id number]  Location:  [facility] [address] [place] [address] Dept: [phone] Loc: [phone]           Date of Service:   08/11/2023  Start Time:   9 AM End Time:   10 AM  Provider/Observer:  Arley Phenix, Psy.D.       Clinical Neuropsychologist       Billing Code/Service: [phone]  Reason for Service:    Aaron [name] is a 38 year old male referred for neuropsychological consultation during his ongoing admission to the comprehensive inpatient rehabilitation unit due to debility and deconditioning followed extended hospital stay.  Patient has a past medical history including PAF with poor compliance to anticoagulants, chronic diastolic congestive heart failure, severe mitral regurgitation, hypothyroidism.  Patient presented on 07/09/2023 with left-sided chest pain radiating to his back and dyspnea for 4 days.  Elevated blood pressure was noted but oxygen saturation was 100%.  Patient had bilateral lower lobe pulmonary emboli.  Hospital course was complicated by PEA arrest/A-fib and extended time on ECMO.  Palliative care has been consulted and continues to follow-up on establishment of goals of care.  Patient is now admitted to CIR due to decreased functional ability/debility.  During today's clinical visit, the patient and his wife are present.  The patient was engaged in discussions and oriented but was clearly fatigued with slowed information processing speed.  Patient has made significant improvements on unit but continues to be limited physically.  The patient has been walking around and making significant functional gains.  Patient denied significant depression but does have a lot of concern about ongoing debility and capacity post discharge.  HPI for the current admission:     HPI: Aaron [name]. Aaron [name] is a 38 year old right-handed male with history significant for PAF not adherent to Eliquis, chronic diastolic congestive heart failure, severe mitral regurgitation, hyperthyroidism. Per chart review patient lives with girlfriend. 1 level apartment. 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 Presented 07/09/2023 with left side chest pain radiating to his back and dyspnea x 4 days. Noted increased pain with inspiration. He did report some radiating pain down his left arm and numbness with tingling sensation. Noted blood pressure 138/108 and oxygen saturations 100%. CT angiogram of the chest showed segmental bilateral lower lobe pulmonary emboli. No evidence for right heart strain. Patchy groundglass opacities in the inferior left lower lobe worrisome for infarct. New mediastinal and bilateral hilar lymphadenopathy. Admission chemistries unremarkable except CO2 of 20, troponin 64-56., BNP 703.7, D-dimer 1.83, TSH less than 0.0100 and free T4 2.15. Intravenous heparin was initiated. Hospital course complicated by PEA arrest/A-fib with RVR and ROSC after 40 minutes status post TNK/ECMO and followed by heart failure team. Cardiac rate remains controlled maintained on amiodarone 200 mg daily. Latest echocardiogram showed ejection fraction of 40 to 45% left ventricle showing mildly decreased function. Postarrest course complicated by ATN requiring CRRT transition to hemodialysis. He was decannulated 3/8 also requiring Impella eventually removed 3/12. He initially failed extubation x 1 due to mucous plugging but eventually was extubated 3/16. 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 Patient currently remains on intravenous heparin for pulmonary emboli plan is to switch to Eliquis after [facility] HD catheter placed. He has had some elevated LFTs/transaminitis likely secondary to shock liver and  monitored. Currently on a regular consistency die with Cortrak removed 3/22t  with swallow study pending results 08/04/2023. Palliative care continue to follow to establish goals of care. Therapy evaluations completed due to patient decreased functional mobility/debility was admitted for a comprehensive rehab program.   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) 08/11/2023   AKI (acute kidney injury) (HCC) 08/08/2023   Normocytic anemia 08/05/2023   Nausea 08/05/2023   Insomnia 08/05/2023   Atrial fibrillation (HCC) 08/05/2023   Malnutrition of moderate degree 08/05/2023   Debility 08/04/2023   Protein-calorie malnutrition, severe 07/23/2023   Palliative care by specialist 07/21/2023   Cardiac arrest (HCC) 07/10/2023   Cardiogenic shock (HCC) 07/10/2023   Hyperkalemia 07/10/2023   On mechanically assisted ventilation (HCC) 07/10/2023   Acute respiratory failure with hypoxia (HCC) 07/10/2023   Bilateral pulmonary embolism (HCC) 07/09/2023   Chronic heart failure with preserved ejection fraction (HFpEF) (HCC) 07/09/2023   Severe mitral regurgitation 07/09/2023   Paroxysmal atrial fibrillation (HCC) 07/09/2023   Atrial fibrillation with rapid ventricular response (HCC) 03/20/2023   Chest pain 03/30/2022   Essential hypertension 03/30/2022   Hypomagnesemia 03/30/2022   Hyperthyroidism 10/23/2021   Elevated troponin 10/19/2021   NSTEMI (non-ST elevated myocardial infarction) (HCC) 10/19/2021    Behavioral Observation/Mental Status:   Aaron [name]  presents as a 38 y.o.-year-old Right handed African American Male who appeared his stated age. his dress was Appropriate and he was Well Groomed and his manners were Appropriate to the situation.  his participation was indicative of  Appropriate and Redirectable behaviors.  There were physical disabilities noted.  he displayed an appropriate level of cooperation and motivation.    Interactions:    Active Appropriate  Attention:   abnormal and attention span appeared shorter than expected for age  Memory:   within normal limits; recent and remote memory intact except for events around critical illness  Visuo-spatial:   not examined  Speech (Volume):  low  Speech:   normal; normal  Thought Process:  Coherent and Relevant  Coherent  Though Content:  WNL; not suicidal and not homicidal  Orientation:   person, place, and situation  Judgment:   Fair  Planning:   Fair  Affect:    Blunted and Flat  Mood:    Dysphoric  Insight:   Fair  Intelligence:   normal   Family Med/Psych History:  Family History  Problem Relation Age of Onset   Heart disease Other    Impression/DX:   Aaron [name] is a 38 year old male referred for neuropsychological consultation during his ongoing admission to the comprehensive inpatient rehabilitation unit due to debility and deconditioning followed extended hospital stay.  Patient has a past medical history including PAF with poor compliance to anticoagulants, chronic diastolic congestive heart failure, severe mitral regurgitation, hypothyroidism.  Patient presented on 07/09/2023 with left-sided chest pain radiating to his back and dyspnea for 4 days.  Elevated blood pressure was noted but oxygen saturation was 100%.  Patient had bilateral lower lobe pulmonary emboli.  Hospital course was complicated by PEA arrest/A-fib and extended time on ECMO.  Palliative care has been consulted and continues to follow-up on establishment of goals  of care.  Patient is now admitted to CIR due to decreased functional ability/debility.  During today's clinical visit, the patient and his wife are present.  The patient was engaged in discussions and oriented but was clearly fatigued with slowed information  processing speed.  Patient has made significant improvements on unit but continues to be limited physically.  The patient has been walking around and making significant functional gains.  Patient denied significant depression but does have a lot of concern about ongoing debility and capacity post discharge.  Disposition/Plan:  Today we worked on coping and adjustment issues and goals for not only his current rehab efforts but also postdischarge rehab efforts.          Electronically Signed   _______________________ Arley Phenix, Psy.D. Clinical Neuropsychologist

## 2023-08-11 NOTE — Progress Notes (Signed)
 [facility] NEPHROLOGY PROGRESS NOTE  Assessment/ Plan: Pt is a 38 y.o. yo male  likely ATN after cardiac arrest, cardiogenic shock on ECMO ( decan 3/8) and Impella ( removed 3/12), AHRF/VDRF.  # Dialysis dependent anuric AKI 2/2 ATN from cardiogenic shock on CRRT 07/11/23 - 07/26/23, Left subclavian temp HD cath placed by CCM; Transitioned to [facility] on 3/17.   -s/p RIJ TDC by IR on 08/08/23.  -UOP has increased and labs showing what appears to be improving kidney function --> Had HD 3/28 then  Cr 4.7 3/29 > 4.3 3/30 > 3.8 3/31 -no indications for HD today, cont daily labs and volume assessment; will maintain [facility] for a few more days  -consulted renal navigator to look for outpatient HD for AKI. May not even need at this point   # Cardiogenic Shock,  Impella removed; status post transcutaneous mitral valve repair; previously on ECMO. Cardiology supportive of transition to rehab.   # VT + PEA SCA, as per cardiology   # PE - on apixiban now   # Normocytic Anemia -  on aranesp - Hb 10-11 now and given AKI appears improving d/c ESA   # Severe MR s/p TEER 3/5  Subjective: Seen and examined.  No new event.  Urine output is around 1.7 L.  During inpatient rehab well.  Denies nausea, vomiting, chest pain, shortness of breath.   Objective Vital signs in last 24 hours: Vitals:   08/10/23 2112 08/11/23 0453 08/11/23 0455 08/11/23 0742  BP: 124/81  128/81   Pulse: 98  91 (!) 103  Resp: 20  20 18   Temp: 98.5 F (36.9 C)  98.3 F (36.8 C)   TempSrc:      SpO2: 100%  100% 98%  Weight:  84 kg    Height:       Weight change: -1.6 kg  Intake/Output Summary (Last 24 hours) at 08/11/2023 1114 Last data filed at 08/11/2023 0745 Gross per 24 hour  Intake 365 ml  Output 1500 ml  Net -1135 ml       Labs: RENAL PANEL Recent Labs  Lab 08/07/23 0415 08/08/23 0438 08/09/23 0549 08/10/23 0736 08/11/23 0602  NA 134* 137 137 138 139  K 4.0 4.3 4.0 4.2 3.8  CL 95* 100 98 104 106   CO2 26 23 26  20* 22  GLUCOSE 91 96 96 134* 88  BUN 36* 49* 36* 36* 34*  CREATININE 5.66* 6.21* 4.66* 4.27* 3.87*  CALCIUM 8.6* 8.5* 8.5* 8.6* 8.6*  PHOS 6.9* 7.2* 5.8* 5.6* 5.3*  ALBUMIN 2.6* 2.6* 2.6* 2.7* 2.8*    Liver Function Tests: Recent Labs  Lab 08/05/23 0500 08/06/23 0559 08/09/23 0549 08/10/23 0736 08/11/23 0602  AST 33  --   --   --   --   ALT 47*  --   --   --   --   ALKPHOS 211*  --   --   --   --   BILITOT 0.7  --   --   --   --   PROT 6.2*  --   --   --   --   ALBUMIN 2.3*   < > 2.6* 2.7* 2.8*   < > = values in this interval not displayed.   No results for input(s): "LIPASE", "AMYLASE" in the last 168 hours. No results for input(s): "AMMONIA" in the last 168 hours. CBC: Recent Labs    08/06/23 0559 08/07/23 4098 08/08/23 1191 08/09/23 0549 08/10/23 4782  HGB 10.6* 10.7* 10.5* 11.0* 10.9*  MCV 92.5 94.8 94.9 93.8 94.0    Cardiac Enzymes: No results for input(s): "CKTOTAL", "CKMB", "CKMBINDEX", "TROPONINI" in the last 168 hours. CBG: Recent Labs  Lab 08/08/23 0611 08/08/23 1205 08/08/23 1826 08/08/23 2105 08/09/23 0600  GLUCAP 94 159* 159* 158* 93    Iron Studies: No results for input(s): "IRON", "TIBC", "TRANSFERRIN", "FERRITIN" in the last 72 hours. Studies/Results: No results found.   Medications: Infusions:    Scheduled Medications:  apixaban  5 mg Oral BID   arformoterol  15 mcg Nebulization BID   ascorbic acid  250 mg Oral BID   budesonide (PULMICORT) nebulizer solution  0.25 mg Nebulization BID   Chlorhexidine Gluconate Cloth  6 each Topical Q12H   Chlorhexidine Gluconate Cloth  6 each Topical Q0600   darbepoetin (ARANESP) injection - DIALYSIS  100 mcg Subcutaneous Q Fri-1800   feeding supplement (NEPRO CARB STEADY)  237 mL Oral Q24H   folic acid  1 mg Oral Daily   methimazole  10 mg Oral Daily   multivitamin  1 tablet Oral QHS   polyethylene glycol  17 g Oral Daily   revefenacin  175 mcg Nebulization Daily   sodium  chloride flush  10-40 mL Intracatheter Q12H   thiamine  100 mg Oral Daily    have reviewed scheduled and prn medications.  Physical Exam: General:NAD, comfortable Heart:RRR, s1s2 nl Lungs:clear b/l, no crackle Abdomen:soft, Non-tender, non-distended Extremities:No edema Dialysis Access: Right IJ TDC in place  Simpson A Milon Dethloff 08/11/2023,11:14 AM  LOS: 7 days

## 2023-08-11 NOTE — Progress Notes (Signed)
 PROGRESS NOTE   Subjective/Complaints:  No new complaints or concerns this morning.  Asking about going home sooner if possible.   ROS: Patient denies fever, new vision changes,  diarrhea,  vomiting,  or chest pain, headache, or mood change.  Denies new motor or sensory changes. + Difficulty sleeping  Objective:   No results found.   Recent Labs    08/09/23 0549 08/10/23 0736  WBC 7.2 7.3  HGB 11.0* 10.9*  HCT 34.6* 34.7*  PLT 228 220   Recent Labs    08/10/23 0736 08/11/23 0602  NA 138 139  K 4.2 3.8  CL 104 106  CO2 20* 22  GLUCOSE 134* 88  BUN 36* 34*  CREATININE 4.27* 3.87*  CALCIUM 8.6* 8.6*    Intake/Output Summary (Last 24 hours) at 08/11/2023 1301 Last data filed at 08/11/2023 0745 Gross per 24 hour  Intake 365 ml  Output 1500 ml  Net -1135 ml         Physical Exam: Vital Signs Blood pressure 128/81, pulse (!) 103, temperature 98.3 F (36.8 C), resp. rate 18, height 6\' 4"  (1.93 m), weight 84 kg, SpO2 98%.   General: NAD, appropriate appearance.  Lying in bed Pleasant, appropriate. Heart: RRR.  No murmurs, rubs, or gallops. Lungs: Clear to auscultation, breathing unlabored, no rales or wheezes Abdomen: Positive bowel sounds, soft nontender to palpation, nondistended Extremities: No clubbing, cyanosis, or edema Skin: No evidence of breakdown, no evidence of rash, right IJ [facility]  Neurologic: Cranial nerves II through XII intact, motor strength is 5/5 in bilateral deltoid, bicep, tricep, grip, 4/5 hip flexor, knee extensors, ankle dorsiflexor and plantar flexor Sensory exam normal sensation to light touch in bilateral upper and lower extremities Musculoskeletal: Full range of motion in all 4 extremities. No joint swelling  Neurological:     Comments: Alert and oriented x 3, follows commands.  No apparent cognitive deficits. Unchanged 3-31  Assessment/Plan: 1. Functional deficits which  require 3+ hours per day of interdisciplinary therapy in a comprehensive inpatient rehab setting. Physiatrist is providing close team supervision and 24 hour management of active medical problems listed below. Physiatrist and rehab team continue to assess barriers to discharge/monitor patient progress toward functional and medical goals  Care Tool:  Bathing    Body parts bathed by patient: Right arm, Left arm, Chest, Abdomen, Front perineal area, Buttocks, Right upper leg, Left upper leg, Face   Body parts bathed by helper: Right lower leg, Left lower leg Body parts n/a: Right lower leg, Left lower leg   Bathing assist Assist Level: Contact Guard/Touching assist     Upper Body Dressing/Undressing Upper body dressing   What is the patient wearing?: Pull over shirt    Upper body assist Assist Level: Set up assist    Lower Body Dressing/Undressing Lower body dressing      What is the patient wearing?: Underwear/pull up, Pants     Lower body assist Assist for lower body dressing: Contact Guard/Touching assist     Toileting Toileting    Toileting assist Assist for toileting: Contact Guard/Touching assist     Transfers Chair/bed transfer  Transfers assist     Chair/bed transfer  assist level: Supervision/Verbal cueing     Locomotion Ambulation   Ambulation assist      Assist level: Supervision/Verbal cueing Assistive device: No Device Max distance: 55   Walk 10 feet activity   Assist     Assist level: Supervision/Verbal cueing Assistive device: No Device   Walk 50 feet activity   Assist    Assist level: Minimal Assistance - Patient > 75% Assistive device: Walker-rolling    Walk 150 feet activity   Assist Walk 150 feet activity did not occur: Safety/medical concerns (fatigue)  Assist level: Supervision/Verbal cueing Assistive device: No Device    Walk 10 feet on uneven surface  activity   Assist Walk 10 feet on uneven surfaces activity  did not occur: Safety/medical concerns (fatigue)   Assist level: Supervision/Verbal cueing     Wheelchair     Assist Is the patient using a wheelchair?: No Type of Wheelchair: Manual    Wheelchair assist level: Dependent - Patient 0%      Wheelchair 50 feet with 2 turns activity    Assist        Assist Level: Dependent - Patient 0%   Wheelchair 150 feet activity     Assist      Assist Level: Dependent - Patient 0%   Blood pressure 128/81, pulse (!) 103, temperature 98.3 F (36.8 C), resp. rate 18, height 6\' 4"  (1.93 m), weight 84 kg, SpO2 98%.  Medical Problem List and Plan: 1. Functional deficits secondary to debility/pulmonary emboli located by small infarct right cerebral and cerebellar hemispheres/hypoxic brain injury             -patient may not shower             -ELOS/Goals: 08/18/23  -Continue CIR, PT OT and SLP  -Therapy reports occasional tremors   -Consider earlier discharge, discussed with team  2.  Antithrombotics: -DVT/anticoagulation:  Pharmaceutical: Heparin has been transitioned to Eliquis             -antiplatelet therapy: Aspirin 81 mg daily 3. Pain Management: Oxycodone as needed 4. Mood/Behavior/Sleep: Provide emotional support             -antipsychotic agents: N/A  -3/25 melatonin as needed ordered for insomnia  -improved with melatonin   3-30: Patient reports he is sleeping about 4 to 5 hours a night, has not been using as needed melatonin.  Encouraged use, patient does not want any medication scheduled.    3/31 continues to decline melatonin  5. Neuropsych/cognition: This patient is capable of making decisions on his own behalf. 6. Skin/Wound Care: Routine skin checks  -3-29: Asked nursing to place Mepilex on open area of right shoulder incision.  3-30: Wounds mostly healed per nursing, stage II on sacrum resolved   7. Fluids/Electrolytes/Nutrition: Routine in and outs with follow-up chemistries 8.  Cardiogenic shock/acute  systolic congestive heart failure/A-fib/severe MR/SVT/PEA arrest.  ROSC x 40 minutes.  Follow-up cardiology services status post ECMO with Impella, decannulated 3/8 and Impella removed and extubated 3/12-reintubated but eventually extubated again 3/16.  Currently on amiodarone 200 mg daily  -3/25 HF team, cardiology plans to stop Amio before discharge.  Appreciate assistance  3/27 wt a little lower after dialysis yesterday  3/31 weight continues to be downtrending    Filed Weights   08/09/23 0558 08/10/23 0307 08/11/23 0453  Weight: 86.6 kg 85.6 kg 84 kg    9.  Acute kidney injury due to ATN/hyperkalemia.  Follow-up renal services.  Required CRRT transition  to hemodialysis  - 3/27 figured after HD yesterday, nephrology ordering tunneled cath, he is NPO for his  3/28 [facility] today, need temporary HD cath removed   3/31 BUN/creatinine improving, reviewed nephrology not-May not need long-term dialysis outpatient 10.  Normocytic anemia.  Follow-up CBC.  Continue Aranesp  -3/27 HGB stable at 10.7  Aranexp decreased and later discontinued by nephrology 11.  Transaminitis.  Likely shock liver.  Follow-up chemistries 12.  Decreased nutritional storage.  Dietary follow-up.  Follow-up MBS.  -3/25 by nutrition today 13.  Hypothyroidism.  Continue Tapazole 10 mg daily.  Need to check TSH in 4 weeks. 14.  Nausea, likely dialysis associated  -Will spread out medications.  Amiodarone may be contributing to this.  -2/27 will check abd xray- no acute findings 15. Dysphagia -Continue SLP   16.  Constipation  -3/27 spent a lot of time on the commode trying to have a bowel movement, check abdomen x-ray and schedule MiraLAX  -3/31 LBM today  17. Hyperglycemia?  -CBGs stable, Discontinue checks   -3/29-30: BG well controlled  18. Hyperphosphatemia   - - 7.2 --> 5.8 3/29   - consider renvela 0.8 mg TID if downtrend does not continue  -3-30: Labs with Phos 5.6; appreciate nephrology assistance in  management, since naturally downtrending will hold off of treatment at this time    LOS: 7 days A FACE TO FACE EVALUATION WAS PERFORMED  Fanny Dance 08/11/2023, 1:01 PM

## 2023-08-11 NOTE — Progress Notes (Signed)
 Left groin staples removed Friday. Wound healed nicely.  2+ pulses in the DP/ PT bilaterally.  No follow up needed at this point.   Victorino Sparrow MD

## 2023-08-11 NOTE — Progress Notes (Signed)
 Occupational Therapy Weekly Progress Note  Patient Details  Name: Aaron [name] MRN: [id number] Date of Birth: [birth year]  Beginning of progress report period: August 04, 2023 End of progress report period: August 10, 2023  {CHL IP REHAB OT TIME CALCULATIONS:304400400}   Patient has met {number 1-5:22450} of {number 1-5:20334} short term goals.  ***  Patient continues to demonstrate the following deficits: {impairments:3041632} and therefore will continue to benefit from skilled OT intervention to enhance overall performance with {ADL/iADL:3041649}.  Patient {LTG progression:3041653}.  {plan of WUJW:1191478}  OT Short Term Goals {OT GNF:6213086}  Skilled Therapeutic Interventions/Progress Updates:     Patient agreeable to participate in OT session. Reports *** pain level.   Patient participated in skilled OT session focusing on ***. Therapist facilitated/assessed/developed/educated/integrated/elicited *** in order to improve/facilitate/promote   Therapy Documentation Precautions:  Precautions Precautions: Fall Precaution/Restrictions Comments: Monitor vitals; HD MWF; Painful L ankle fx from 2010, mild L hemiparesis Restrictions Weight Bearing Restrictions Per Provider Order: No RLE Weight Bearing Per Provider Order: Non weight bearing LLE Weight Bearing Per Provider Order: Non weight bearing  Therapy/Group: Individual Therapy  Limmie Patricia, OTR/L,CBIS  Supplemental OT - [facility] and [facility] Secure Chat Preferred   08/11/2023, 4:44 PM

## 2023-08-11 NOTE — Progress Notes (Signed)
 Patient ID: Aaron [name], male   DOB: 07-Jan-1986, 39 y.o.   MRN: [id number]  Team feels pt can go home sooner than set discharge date of 4/7. According to bedside RN HD is on pause and will know if needs OP-HD in the next 48 hours. Team would like discharge to be Wed or Thursday will need to await medical issues and setting up OP-HD if needed.

## 2023-08-11 NOTE — Progress Notes (Signed)
 Speech Language Pathology Daily Session Note  Patient Details  Name: Aaron [name] MRN: [id number] Date of Birth: [birth year]  Today's Date: 08/11/2023 SLP Individual Time: 1003-1059 SLP Individual Time Calculation (min): 56 min  Short Term Goals: Week 1: SLP Short Term Goal 1 (Week 1): Patient will utilize chin tuck during consumption of thin liquids given min multimodal A SLP Short Term Goal 2 (Week 1): Patient will demonstrate problem solving abilities in functional situations given min multimodal A SLP Short Term Goal 3 (Week 1): Patient will recall and utilize memory compensatory strategies given min multimodal A  Skilled Therapeutic Interventions:  Patient was seen in am to address cognitive re- training. Pt was alert and seen at bedside. He was agreeable for session. Pt able to verbalize recent medical history along with PLOF. When asked about his cognition he stated, " It's there, I just feel more delayed." SLP reviewed WRAP compensatory strategies with pt reporting use of writing information down strategy during hospitalization. SLP challenged pt in recall of diet precautions including renal diet and limiting potassium and phosphorus. Utilizing education booklet in his room SLP instructed pt in recommendations. After 20 minute delays, pt able to recall foods to limit with 100% acc sup A. In other minutes of session, SLP addressed medication management through challenging pt in identifying solutions to problems presented verbally. Pt identified solutions with sup A. SLP subsequently challenged pt to interpret prescription labels presented verbally. Pt completed task with 100% acc mod I. Direct handoff to PT at conclusion of session.   Pain Pain Assessment Pain Scale: 0-10 Pain Score: 0-No pain  Therapy/Group: Individual Therapy  Renaee Munda 08/11/2023, 12:39 PM

## 2023-08-11 NOTE — Progress Notes (Addendum)
 Nutrition Follow-up  DOCUMENTATION CODES:   Non-severe (moderate) malnutrition in context of acute illness/injury  INTERVENTION:   D/C Nepro shake, patient not drinking. D/C Magic cup, patient does not like. Continue double protein portions with meals. Continue HS snack daily, adjust per patient preference. Continue renal MVI daily.  NUTRITION DIAGNOSIS:   Moderate Malnutrition related to acute illness as evidenced by mild fat depletion, mild muscle depletion.  Ongoing   GOAL:   Patient will meet greater than or equal to 90% of their needs  Progressing   MONITOR:   PO intake, Supplement acceptance, Skin  REASON FOR ASSESSMENT:   Other (Comment) (Verbal consult from RN for diet education)    ASSESSMENT:   38 yo male admitted to rehab with debility S/P acute hospitalization for PE, cardiac arrest, ECMO, CRRT. PMH includes NSTEMI, hyperthyroidism, mitral regurgitation, atrial fibrillation.  Patient states that he is eating well. Lunch tray at bedside ~50% eaten. Someone brought him in outside food, so he was not as hungry for lunch provided by hospital. He does not like the Nepro shakes or Magic cup supplements, will d/c both. He is receiving a snack each day at bedtime, requested a change to his snack, so RD will adjust snack order.   Patient remains on a renal diet with 1500 ml fluid restriction. Meal intakes 90-100%.  Labs and medications reviewed.  Plans for d/c home earlier than expected, hopeful for discharge later this week.   Admit weight: 88.6 kg (3/24) Current weight: 84 kg (3/31)  Patient continues to receive iHD as needed. AKI improving. Last HD 3/28, no HD planned today. He may not require outpatient HD per Nephrology note.   Diet Order:   Diet Order             Diet renal with fluid restriction Fluid restriction: 1500 mL Fluid; Room service appropriate? Yes; Fluid consistency: Thin  Diet effective now                   EDUCATION NEEDS:    Education needs have been addressed  Skin:   Incisions: L groin, R axilla  Last BM:  3/31  Height:   Ht Readings from Last 1 Encounters:  08/04/23 6\' 4"  (1.93 m)    Weight:   Wt Readings from Last 1 Encounters:  08/11/23 84 kg    Ideal Body Weight:  91.8 kg  BMI:  Body mass index is 22.54 kg/m.  Estimated Nutritional Needs:   Kcal:  2700-3000  Protein:  140-160 gm  Fluid:  1 L + UOP   Gabriel Rainwater RD, LDN, CNSC Contact via secure chat. If unavailable, use group chat "RD Inpatient."

## 2023-08-11 NOTE — Progress Notes (Incomplete)
 Physical Therapy Weekly Progress Note  Patient Details  Name: Aaron [name] MRN: [id number] Date of Birth: 1986/03/14  Beginning of progress report period: August 05, 2023 End of progress report period: August 11, 2023  {CHL IP REHAB PT TIME CALCULATION:304800500}  Patient has met 3 of 3 short term goals.  Pt is completing bed mobility independently. Sit to stand and ambulatory transfers with Aaron [name] Balance Assessment 45/56 on3/30. Gait [phone] feet with no AD and sup/mod I, no evidence of LOB, however pt reports feeling winded. Working on pt overall endurance and higher level dynamic balance. D/C currently scheduled for 4/7.Upgraded goals to independent 2/2 pt progressing quicker than anticipated.   Patient continues to demonstrate the following deficits {impairments:3041632} and therefore will continue to benefit from skilled PT intervention to increase functional independence with mobility.  Patient progressing toward long term goals..  Continue plan of care.  PT Short Term Goals {XBJ:4782956}  Skilled Therapeutic Interventions/Progress Updates:      Pt supine in bed upon arrival. Pt agreeable to therapy. Pt denies any pain.  Pt performed bed mobility independently.  Pt donned socks and shoes while seated EOB with set up assist.   Pt performed 6MW--pt ambulated 1100 feet (335 meters) with no AD and supervision/mod I.   Pt ambulated outside to [place] tower entrance. Pt ambulated 2 laps around courtyard on uneven terrain with supervision.   Pt performed sit to stand x10 with arms across chest, with supervision/CGA, verbal cues provided for technique and safety with controlled descent.   Pt performed the following activities for higher level dynamic balance  Tandem walking 2x30 feet with close supervision and intermittent use of stepping strategy  Lateral stepping with anterior cross overs x30 feet B  Borg RPE monitored throughout, pt performed seated rest breaks as needed  throughout session fatigue/SOB/"feeling winded"   Pt supine in bed at end of session with all needs within reach and bed alarm on.          Therapy Documentation Precautions:  Precautions Precautions: Fall Precaution/Restrictions Comments: Monitor vitals; HD MWF; Painful L ankle fx from 2010, mild L hemiparesis Restrictions Weight Bearing Restrictions Per Provider Order: No RLE Weight Bearing Per Provider Order: Non weight bearing LLE Weight Bearing Per Provider Order: Non weight bearing General:   Vital Signs: Therapy Vitals Temp: 98.3 F (36.8 C) Pulse Rate: (!) 103 Resp: 18 BP: 128/81 Patient Position (if appropriate): Lying Oxygen Therapy SpO2: 98 % O2 Device: Room Air Pain:   Vision/Perception     Mobility:   Locomotion :    Trunk/Postural Assessment :    Balance:   Exercises:   Other Treatments:     Therapy/Group: {Therapy/Group:3049007}  Aaron [name] 08/11/2023, 7:56 AM

## 2023-08-11 NOTE — Plan of Care (Signed)
  Problem: Consults Goal: RH STROKE PATIENT EDUCATION Description: See Patient Education module for education specifics  Outcome: Progressing   Problem: RH BOWEL ELIMINATION Goal: RH STG MANAGE BOWEL WITH ASSISTANCE Description: STG Manage Bowel with  supervision Assistance. Outcome: Progressing   Problem: RH BLADDER ELIMINATION Goal: RH STG MANAGE BLADDER WITH ASSISTANCE Description: STG Manage Bladder With  supervision Assistance Outcome: Progressing   Problem: RH SKIN INTEGRITY Goal: RH STG SKIN FREE OF INFECTION/BREAKDOWN Outcome: Progressing   Problem: RH SAFETY Goal: RH STG ADHERE TO SAFETY PRECAUTIONS W/ASSISTANCE/DEVICE Description: STG Adhere to Safety Precautions With  supervision Assistance/Device. Outcome: Progressing   Problem: RH KNOWLEDGE DEFICIT Goal: RH STG INCREASE KNOWLEDGE OF STROKE PROPHYLAXIS Description: Manage increases  knowledge of stroke prophylaxis  with supervision assistance using educational materials provided Outcome: Progressing   Problem: Education: Goal: Knowledge of General Education information will improve Description: Including pain rating scale, medication(s)/side effects and non-pharmacologic comfort measures Outcome: Progressing

## 2023-08-11 NOTE — Progress Notes (Signed)
 Occupational Therapy Session Note  Patient Details  Name: Aaron [name] MRN: [id number] Date of Birth: Oct 28, 1985  Today's Date: 08/11/2023 OT Individual Time: 6440-3474 OT Individual Time Calculation (min): 60 min  and Today's Date: 08/11/2023 OT Missed Time: 15 Minutes Missed Time Reason: Unavailable (comment) (Respiratory)   Short Term Goals: Week 1:  OT Short Term Goal 1 (Week 1): Pt will perform LB ADLs with Mod A + LRAD at EOB. OT Short Term Goal 2 (Week 1): Pt will perform toilet transfer with Min A + LRAD. OT Short Term Goal 3 (Week 1): Pt will tolerate standing activities >2 mins with Min A + LRAD.  Skilled Therapeutic Interventions/Progress Updates:    Pt received supine with no c/o pain, agreeable to OT session. Discussed plan for session and initiated tx. Respiratory therapist arrived with pt's breathing tx and pt preferred to do this prior to session starting. 15 min missed. Pt came to EOB with mod I. Ambulatory transfer to the sink with (S). 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 Discussed potential adaptations to job throughout, pt reporting BUE tremor could impact job- recommended trialing wrist weight after ADLs. Pt completed standing level UB and LB bathing with (S), no LOB. Vitals monitored intermittently and all stable. Discussed use of BORG to self monitor fatigue and need for rest breaks. Min cueing provided for completing LB dressing seated. Edu provided re fall risk reduction strategies. He donned shirt, underwear and shorts seated with (S). He completed 200 ft of functional mobility to the therapy gym with staggering gait at times but overall stable with no LOB, requiring (S). Applied bilateral wrist weights and completed several functional reaching tasks to assess effect on accuracy and coordination of reach. He completed 157 repetitions bilaterally on the BITS- high repetitions to maximize NMR. He reported  slight improvement from weights so encouraged him to leave them on during self feeding. Also retrieved pt weighted silverware to trial during meal. Pt returned to his room at end of session and was left supine with all needs met.   Therapy Documentation Precautions:  Precautions Precautions: Fall Precaution/Restrictions Comments: Monitor vitals; HD MWF; Painful L ankle fx from 2010, mild L hemiparesis Restrictions  Therapy/Group: Individual Therapy  Crissie Reese 08/11/2023, 6:24 AM

## 2023-08-12 DIAGNOSIS — D649 Anemia, unspecified: Secondary | ICD-10-CM | POA: Diagnosis not present

## 2023-08-12 DIAGNOSIS — R570 Cardiogenic shock: Secondary | ICD-10-CM | POA: Diagnosis not present

## 2023-08-12 DIAGNOSIS — J9601 Acute respiratory failure with hypoxia: Secondary | ICD-10-CM | POA: Diagnosis not present

## 2023-08-12 DIAGNOSIS — K5900 Constipation, unspecified: Secondary | ICD-10-CM | POA: Diagnosis not present

## 2023-08-12 DIAGNOSIS — N170 Acute kidney failure with tubular necrosis: Secondary | ICD-10-CM | POA: Diagnosis not present

## 2023-08-12 DIAGNOSIS — R0602 Shortness of breath: Secondary | ICD-10-CM | POA: Diagnosis not present

## 2023-08-12 DIAGNOSIS — N179 Acute kidney failure, unspecified: Secondary | ICD-10-CM | POA: Diagnosis not present

## 2023-08-12 DIAGNOSIS — I4891 Unspecified atrial fibrillation: Secondary | ICD-10-CM | POA: Diagnosis not present

## 2023-08-12 DIAGNOSIS — R1310 Dysphagia, unspecified: Secondary | ICD-10-CM

## 2023-08-12 DIAGNOSIS — I469 Cardiac arrest, cause unspecified: Secondary | ICD-10-CM | POA: Diagnosis not present

## 2023-08-12 DIAGNOSIS — E875 Hyperkalemia: Secondary | ICD-10-CM | POA: Diagnosis not present

## 2023-08-12 DIAGNOSIS — R079 Chest pain, unspecified: Secondary | ICD-10-CM | POA: Diagnosis not present

## 2023-08-12 DIAGNOSIS — E872 Acidosis, unspecified: Secondary | ICD-10-CM | POA: Diagnosis not present

## 2023-08-12 DIAGNOSIS — R5381 Other malaise: Secondary | ICD-10-CM | POA: Diagnosis not present

## 2023-08-12 LAB — RENAL FUNCTION PANEL
Albumin: 2.7 g/dL — ABNORMAL LOW (ref 3.5–5.0)
Anion gap: 14 (ref 5–15)
BUN: 38 mg/dL — ABNORMAL HIGH (ref 6–20)
CO2: 17 mmol/L — ABNORMAL LOW (ref 22–32)
Calcium: 8.7 mg/dL — ABNORMAL LOW (ref 8.9–10.3)
Chloride: 109 mmol/L (ref 98–111)
Creatinine, Ser: 3.71 mg/dL — ABNORMAL HIGH (ref 0.61–1.24)
GFR, Estimated: 20 mL/min — ABNORMAL LOW (ref >60)
Glucose, Bld: 81 mg/dL (ref 70–99)
Phosphorus: 4.9 mg/dL — ABNORMAL HIGH (ref 2.5–4.6)
Potassium: 4.3 mmol/L (ref 3.5–5.1)
Sodium: 140 mmol/L (ref 135–145)

## 2023-08-12 NOTE — Progress Notes (Signed)
 Speech Language Pathology Daily Session Note  Patient Details  Name: Aaron [name] MRN: [id number] Date of Birth: 08-Jul-1985  Today's Date: 08/12/2023 SLP Individual Time: 0902-0959 SLP Individual Time Calculation (min): 57 min  Short Term Goals: Week 1: SLP Short Term Goal 1 (Week 1): Patient will utilize chin tuck during consumption of thin liquids given min multimodal A SLP Short Term Goal 2 (Week 1): Patient will demonstrate problem solving abilities in functional situations given min multimodal A SLP Short Term Goal 3 (Week 1): Patient will recall and utilize memory compensatory strategies given min multimodal A  Skilled Therapeutic Interventions: SLP conducted skilled therapy session targeting dysphagia management and cognitive retraining goals. Patient agreeable to all therapy tasks and pleasant throughout. Upon SLP entry, patient reports to MD that he is missing his banking password and cannot access his account. With modI and utilizing phone notes app to write down important information, patient navigated phone call with banking assistant to access bank app. SLP facilitated various complex cognitive tasks. Throughout, patient benefited from intermittent cuing for processing speed but overall achieved 95% accuracy with modI when given extra time. Re: swallowing, patient consumed several sips of thin liquids from straw. He benefited from min verbal cuing to accurately perform chin tuck maneuver. Patient endorses that his discharge may be moved up, thus SLP will plan to perform repeated MBS to determine need for chin tuck upon discharge. Patient in agreement, scheduled for 08/13/23 at 9 AM. Patient was left in chair with call bell in reach and chair alarm set. SLP will continue to target goals per plan of care.        Pain Pain Assessment Pain Scale: 0-10 Pain Score: 0-No pain  Therapy/Group: Individual Therapy  Jeannie Done, M.A., CCC-SLP  Yetta Barre 08/12/2023, 10:15 AM

## 2023-08-12 NOTE — Progress Notes (Signed)
 Occupational Therapy Session Note  Patient Details  Name: Aaron [name] MRN: [id number] Date of Birth: [birth year]  Session 1: {CHL IP REHAB OT TIME CALCULATIONS:304400400} Session 2: {CHL IP REHAB OT TIME CALCULATIONS:304400400}  Short Term Goals: Week 2:  OT Short Term Goal 1 (Week 2): LTG = STG (d/t ELOS)  Skilled Therapeutic Interventions/Progress Updates:    Session 1: Patient agreeable to participate in OT session. Reports *** pain level.   Patient participated in skilled OT session focusing on ***. Therapist facilitated/assessed/developed/educated/integrated/elicited *** in order to improve/facilitate/promote   Session 2: Patient agreeable to participate in OT session. Reports *** pain level.   Patient participated in skilled OT session focusing on ***. Therapist facilitated/assessed/developed/educated/integrated/elicited *** in order to improve/facilitate/promote    Therapy Documentation Precautions:  Precautions Precautions: Fall Precaution/Restrictions Comments: Monitor vitals; HD MWF; Painful L ankle fx from 2010, mild L hemiparesis Restrictions Weight Bearing Restrictions Per Provider Order: No RLE Weight Bearing Per Provider Order: Non weight bearing LLE Weight Bearing Per Provider Order: Non weight bearing  Therapy/Group: Individual Therapy  Limmie Patricia, OTR/L,CBIS  Supplemental OT - [facility] and [facility] Secure Chat Preferred   08/12/2023, 4:46 PM

## 2023-08-12 NOTE — Progress Notes (Signed)
 [facility] NEPHROLOGY PROGRESS NOTE  Assessment/ Plan: Pt is a 38 y.o. yo male  likely ATN after cardiac arrest, cardiogenic shock on ECMO ( decan 3/8) and Impella ( removed 3/12), AHRF/VDRF.  # Dialysis dependent anuric AKI 2/2 ATN from cardiogenic shock on CRRT 07/11/23 - 07/26/23, Left subclavian temp HD cath placed by CCM; Transitioned to [facility] on 3/17.   -s/p RIJ TDC by IR on 08/08/23.  -UOP has increased and labs showing what appears to be improving kidney function --> Had HD 3/28 then  Cr 4.7 3/29 > 4.3 3/30 > 3.8 3/31 > 3.7 4/1 -no indications for HD today, cont daily labs and volume assessment; will maintain TDC until tomorrow -consulted renal navigator to look for outpatient HD for AKI. May not even need at this point   # Cardiogenic Shock,  Impella removed; status post transcutaneous mitral valve repair; previously on ECMO. Cardiology supportive of transition to rehab.   # VT + PEA SCA, as per cardiology   # PE - on apixiban now   # Normocytic Anemia -  on aranesp - Hb 10-11 now and given AKI appears improving d/c ESA   # Severe MR s/p TEER 3/5  Subjective: Seen and examined.  No new event.  Urine output is around 2.7 L. Doing inpatient rehab well.  Denies nausea, vomiting, chest pain, shortness of breath.    Objective Vital signs in last 24 hours: Vitals:   08/11/23 2007 08/11/23 2048 08/12/23 0500 08/12/23 0508  BP: 133/89   128/82  Pulse: 91   92  Resp: 18   18  Temp: (!) 97.4 F (36.3 C)   98.4 F (36.9 C)  TempSrc:      SpO2: 100% 100%  100%  Weight:   86.1 kg   Height:       Weight change: 2.6 kg  Intake/Output Summary (Last 24 hours) at 08/12/2023 0912 Last data filed at 08/12/2023 0829 Gross per 24 hour  Intake 724 ml  Output 2950 ml  Net -2226 ml       Labs: RENAL PANEL Recent Labs  Lab 08/08/23 0438 08/09/23 0549 08/10/23 0736 08/11/23 0602 08/12/23 0538  NA 137 137 138 139 140  K 4.3 4.0 4.2 3.8 4.3  CL 100 98 104 106 109   CO2 23 26 20* 22 17*  GLUCOSE 96 96 134* 88 81  BUN 49* 36* 36* 34* 38*  CREATININE 6.21* 4.66* 4.27* 3.87* 3.71*  CALCIUM 8.5* 8.5* 8.6* 8.6* 8.7*  PHOS 7.2* 5.8* 5.6* 5.3* 4.9*  ALBUMIN 2.6* 2.6* 2.7* 2.8* 2.7*    Liver Function Tests: Recent Labs  Lab 08/10/23 0736 08/11/23 0602 08/12/23 0538  ALBUMIN 2.7* 2.8* 2.7*   No results for input(s): "LIPASE", "AMYLASE" in the last 168 hours. No results for input(s): "AMMONIA" in the last 168 hours. CBC: Recent Labs    08/06/23 0559 08/07/23 0415 08/08/23 0438 08/09/23 0549 08/10/23 0736  HGB 10.6* 10.7* 10.5* 11.0* 10.9*  MCV 92.5 94.8 94.9 93.8 94.0    Cardiac Enzymes: No results for input(s): "CKTOTAL", "CKMB", "CKMBINDEX", "TROPONINI" in the last 168 hours. CBG: Recent Labs  Lab 08/08/23 0611 08/08/23 1205 08/08/23 1826 08/08/23 2105 08/09/23 0600  GLUCAP 94 159* 159* 158* 93    Iron Studies: No results for input(s): "IRON", "TIBC", "TRANSFERRIN", "FERRITIN" in the last 72 hours. Studies/Results: No results found.   Medications: Infusions:    Scheduled Medications:  apixaban  5 mg Oral BID   arformoterol  15 mcg Nebulization BID   ascorbic acid  250 mg Oral BID   budesonide (PULMICORT) nebulizer solution  0.25 mg Nebulization BID   Chlorhexidine Gluconate Cloth  6 each Topical Q12H   Chlorhexidine Gluconate Cloth  6 each Topical Q0600   folic acid  1 mg Oral Daily   methimazole  10 mg Oral Daily   multivitamin  1 tablet Oral QHS   polyethylene glycol  17 g Oral Daily   revefenacin  175 mcg Nebulization Daily   sodium chloride flush  10-40 mL Intracatheter Q12H   thiamine  100 mg Oral Daily    have reviewed scheduled and prn medications.  Physical Exam: General:NAD, comfortable Heart:RRR, s1s2 nl Lungs:clear b/l, no crackle Abdomen:soft, Non-tender, non-distended Extremities:No edema Dialysis Access: Right IJ TDC in place  [place] A Dennice Tindol 08/12/2023,9:12 AM  LOS: 8 days

## 2023-08-12 NOTE — Progress Notes (Signed)
 Physical Therapy Session Note  Patient Details  Name: Aaron [name] MRN: [id number] Date of Birth: Sep 21, 1985  Today's Date: 08/12/2023 PT Individual Time: 0800-0858 PT Individual Time Calculation (min): 58 min   Short Term Goals: Week 2:  PT Short Term Goal 1 (Week 2): STG=LTG 2/2 ELOS  Skilled Therapeutic Interventions/Progress Updates:      Pt supine in bed upon arrival. Pt agreeable to therapy. Pt denies any pain.   Pt donned and doffed shorts, socks and shoes independently while in bed.   Pt ambulated room to main gym with no AD independently.   Borg RPE monitored throughout session, pt reports 12-15 throughout session, pt requiring seated rest breaks for SOB/fatigue.   Pt performed 2x10 sit to stand on airex pad and arms across chest with supervision and increased time with prolonged rest break between trials. Pt reports this activity is the most taxing.   Pt navigated hurdles of various heights 6-11 inches heigh with reciprocal gait and supervision, verbal cues provided for increased step height for L LE.   Pt picked up 6 cones off of floor with supervision/mod I, of note B LE shaking, however no LOB.   Pt ambulated room to outside at [facility], in and out of elevator and up and down uneven ramp with mod I. Pt required seated rest break for fatigue.   Pt navigated 12 6 inch steps with no handrails and reciprocal gait with supervision, verbal cues provided to slow down and for positioning of B LE for safety.   Pt seated in recliner at end of session with all needs within reach.      Therapy Documentation Precautions:  Precautions Precautions: Fall Precaution/Restrictions Comments: Monitor vitals; HD MWF; Painful L ankle fx from 2010, mild L hemiparesis Restrictions Weight Bearing Restrictions Per Provider Order: No RLE Weight Bearing Per Provider Order: Non weight bearing LLE Weight Bearing Per Provider Order: Non weight bearing  Therapy/Group: Individual  Therapy  [facility] [name], [place], DPT  08/12/2023, 7:46 AM

## 2023-08-12 NOTE — Progress Notes (Signed)
  [facility]   MULTIDISCIPLINARY HEART VALVE TEAM  I went to see pt for his 1 month s/p mTEER check in. He is doing excellent and about to leave rehab tomorrow (early since he is doing so well). He has been working with PT and only gets short of breath when exercising vigorously with the rehab team. He has NYHA class I symptoms. KCCQ completed and copied below. He wanted to relay his sincere thanks to the entire team who saved his life. He was very grateful.   Echo done 08/08/23 showed EF 40-45%, G2DD, moderate LAE, s/p mTEER with x3 mitraclip with mild residual MR (mean gradient of 6 mmhg), mild TR.  Discussed SBE prophylaxis with pt as well.   [place] Cardiomyopathy Questionnaire     08/12/2023   10:04 AM  KCCQ-12  1 a. Ability to shower/bathe Not at all limited  1 b. Ability to walk 1 block Not at all limited  1 c. Ability to hurry/jog Slightly limited  2. Edema feet/ankles/legs Never over the past 2 weeks  3. Limited by fatigue Never over the past 2 weeks  4. Limited by dyspnea Never over the past 2 weeks  5. Sitting up / on 3+ pillows Never over the past 2 weeks  6. Limited enjoyment of life Not limited at all  7. Rest of life w/ symptoms Completely satisfied  8 a. Participation in hobbies Did not limit at all  8 b. Participation in chores Did not limit at all  8 c. Visiting family/friends Did not limit at all       Cline Crock PA-C  [facility]

## 2023-08-12 NOTE — Progress Notes (Signed)
 PROGRESS NOTE   Subjective/Complaints:  Patient reports he is doing well overall.  No new concerns or complaints this morning.   ROS: Patient denies fever, new vision changes, dizziness, nausea, vomiting, diarrhea,  shortness of breath or chest pain, headache, or mood change.   Objective:   No results found.   Recent Labs    08/10/23 0736  WBC 7.3  HGB 10.9*  HCT 34.7*  PLT 220   Recent Labs    08/11/23 0602 08/12/23 0538  NA 139 140  K 3.8 4.3  CL 106 109  CO2 22 17*  GLUCOSE 88 81  BUN 34* 38*  CREATININE 3.87* 3.71*  CALCIUM 8.6* 8.7*    Intake/Output Summary (Last 24 hours) at 08/12/2023 1253 Last data filed at 08/12/2023 0829 Gross per 24 hour  Intake 724 ml  Output 2750 ml  Net -2026 ml         Physical Exam: Vital Signs Blood pressure 128/82, pulse 92, temperature 98.4 F (36.9 C), resp. rate 18, height 6\' 4"  (1.93 m), weight 86.1 kg, SpO2 100%.   General: NAD, appropriate appearance.  Sitting in bedside chair, appears comfortable Pleasant, appropriate. Heart: RRR.  No murmurs, rubs, or gallops. Lungs: Clear to auscultation, breathing unlabored, no rales or wheezes Abdomen: Positive bowel sounds, soft nontender to palpation, nondistended Extremities: No clubbing, cyanosis, or edema Skin: No evidence of breakdown, no evidence of rash, right IJ [facility]  Neurologic: Cranial nerves II through XII intact, motor strength is 5/5 in bilateral deltoid, bicep, tricep, grip, 4+/5 hip flexor, knee extensors, ankle dorsiflexor and plantar flexor Sensory exam normal sensation to light touch in bilateral upper and lower extremities Musculoskeletal: Full range of motion in all 4 extremities. No joint swelling  Neurological:     Comments: Alert and oriented x 3, follows commands.  No apparent cognitive deficits. Unchanged 4-1  Assessment/Plan: 1. Functional deficits which require 3+ hours per day of  interdisciplinary therapy in a comprehensive inpatient rehab setting. Physiatrist is providing close team supervision and 24 hour management of active medical problems listed below. Physiatrist and rehab team continue to assess barriers to discharge/monitor patient progress toward functional and medical goals  Care Tool:  Bathing    Body parts bathed by patient: Right arm, Left arm, Chest, Abdomen, Front perineal area, Buttocks, Right upper leg, Left upper leg, Face   Body parts bathed by helper: Right lower leg, Left lower leg Body parts n/a: Right lower leg, Left lower leg   Bathing assist Assist Level: Contact Guard/Touching assist     Upper Body Dressing/Undressing Upper body dressing   What is the patient wearing?: Pull over shirt    Upper body assist Assist Level: Set up assist    Lower Body Dressing/Undressing Lower body dressing      What is the patient wearing?: Underwear/pull up, Pants     Lower body assist Assist for lower body dressing: Contact Guard/Touching assist     Toileting Toileting    Toileting assist Assist for toileting: Contact Guard/Touching assist     Transfers Chair/bed transfer  Transfers assist     Chair/bed transfer assist level: Supervision/Verbal cueing     Locomotion Ambulation  Ambulation assist      Assist level: Supervision/Verbal cueing Assistive device: No Device Max distance: 55   Walk 10 feet activity   Assist     Assist level: Supervision/Verbal cueing Assistive device: No Device   Walk 50 feet activity   Assist    Assist level: Minimal Assistance - Patient > 75% Assistive device: Walker-rolling    Walk 150 feet activity   Assist Walk 150 feet activity did not occur: Safety/medical concerns (fatigue)  Assist level: Supervision/Verbal cueing Assistive device: No Device    Walk 10 feet on uneven surface  activity   Assist Walk 10 feet on uneven surfaces activity did not occur:  Safety/medical concerns (fatigue)   Assist level: Supervision/Verbal cueing     Wheelchair     Assist Is the patient using a wheelchair?: No Type of Wheelchair: Manual    Wheelchair assist level: Dependent - Patient 0%      Wheelchair 50 feet with 2 turns activity    Assist        Assist Level: Dependent - Patient 0%   Wheelchair 150 feet activity     Assist      Assist Level: Dependent - Patient 0%   Blood pressure 128/82, pulse 92, temperature 98.4 F (36.9 C), resp. rate 18, height 6\' 4"  (1.93 m), weight 86.1 kg, SpO2 100%.  Medical Problem List and Plan: 1. 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  Antithrombotics: -DVT/anticoagulation:  Pharmaceutical: Heparin has been transitioned to Eliquis             -antiplatelet therapy: Aspirin 81 mg daily 3. Pain Management: Oxycodone as needed 4. Mood/Behavior/Sleep: Provide emotional support             -antipsychotic agents: N/A  -3/25 melatonin as needed ordered for insomnia  -improved with melatonin   3-30: Patient reports he is sleeping about 4 to 5 hours a night, has not been using as needed melatonin.  Encouraged use, patient does not want any medication scheduled.    3/31 continues to decline melatonin  5. Neuropsych/cognition: This patient is capable of making decisions on his own behalf. 6. Skin/Wound Care: Routine skin checks  -3-29: Asked nursing to place Mepilex on open area of right shoulder incision.  3-30: Wounds mostly healed per nursing, stage II on sacrum resolved   7. Fluids/Electrolytes/Nutrition: Routine in and outs with follow-up chemistries 8.  Cardiogenic  shock/acute systolic congestive heart failure/A-fib/severe MR/SVT/PEA arrest.  ROSC x 40 minutes.  Follow-up cardiology services status post ECMO with Impella, decannulated 3/8 and Impella removed and extubated 3/12-reintubated but eventually extubated again 3/16.  Currently on amiodarone 200 mg daily  -3/25 HF team, cardiology plans to stop Amio before discharge.  Appreciate assistance  3/27 wt a little lower after dialysis yesterday  4/1 cardiology has discontinued amiodarone, has been stable    Filed Weights   08/11/23 0453 08/11/23 1555 08/12/23 0500  Weight: 84 kg 86.6 kg 86.1 kg    9.  Acute kidney injury due to ATN/hyperkalemia.  Follow-up renal services.  Required CRRT transition to hemodialysis  - 3/27 figured after  HD yesterday, nephrology ordering tunneled cath, he is NPO for his  3/28 [facility] today, need temporary HD cath removed   3/31 BUN/creatinine improving, reviewed nephrology not-May not need long-term dialysis outpatient  4/1 nephrology note reviewed, maintain TDC until at least tomorrow 10.  Normocytic anemia.  Follow-up CBC.  Continue Aranesp  -3/27 HGB stable at 10.7  Aranexp decreased and later discontinued by nephrology 11.  Transaminitis.  Likely shock liver.  Follow-up chemistries 12.  Decreased nutritional storage.  Dietary follow-up.  Follow-up MBS.  -3/25 by nutrition today 13.  Hypothyroidism.  Continue Tapazole 10 mg daily.  Need to check TSH in 4 weeks.  -4/1 cardiology recommends recheck thyroid test second week of April and endocrine follow-up outpatient 14.  Nausea, likely dialysis associated  -Will spread out medications.  Amiodarone may be contributing to this.  -2/27 will check abd xray- no acute findings 15. Dysphagia -Continue SLP   -4/1 speech planning MBS tomorrow 16.  Constipation  -3/27 spent a lot of time on the commode trying to have a bowel movement, check abdomen x-ray and schedule MiraLAX  -4/1 LBM yesterday 17. Hyperglycemia?  -CBGs  stable, Discontinue checks   -3/29-30: BG well controlled  18. Hyperphosphatemia   - - 7.2 --> 5.8 3/29   - consider renvela 0.8 mg TID if downtrend does not continue  -3-30: Labs with Phos 5.6; appreciate nephrology assistance in management, since naturally downtrending will hold off of treatment at this time    LOS: 8 days A FACE TO FACE EVALUATION WAS PERFORMED  Fanny Dance 08/12/2023, 12:53 PM

## 2023-08-12 NOTE — Progress Notes (Signed)
 Physical Therapy Session Note  Patient Details  Name: Aaron [name] MRN: [id number] Date of Birth: 07/25/1985   Short Term Goals: Week 2:  PT Short Term Goal 1 (Week 2): STG=LTG 2/2 ELOS  Skilled Therapeutic Interventions/Progress Updates:    Therapist attempted to make up missed minutes with pt, however pt refusing 2/2 fatigue/sleepiness.  Therapist offered to bring pt to panera downstairs to encourage participating however pt still refusing.   Therapy Documentation Precautions:  Precautions Precautions: Fall Precaution/Restrictions Comments: Monitor vitals; HD MWF; Painful L ankle fx from 2010, mild L hemiparesis Restrictions Weight Bearing Restrictions Per Provider Order: No RLE Weight Bearing Per Provider Order: Non weight bearing LLE Weight Bearing Per Provider Order: Non weight bearing  Therapy/Group: Individual Therapy  [facility] [name], [place], DPT  08/12/2023, 2:41 PM

## 2023-08-12 NOTE — Plan of Care (Signed)
  Problem: RH BOWEL ELIMINATION Goal: RH STG MANAGE BOWEL WITH ASSISTANCE Description: STG Manage Bowel with  supervision Assistance. Outcome: Progressing   Problem: RH BLADDER ELIMINATION Goal: RH STG MANAGE BLADDER WITH ASSISTANCE Description: STG Manage Bladder With  supervision Assistance Outcome: Progressing   Problem: RH SKIN INTEGRITY Goal: RH STG SKIN FREE OF INFECTION/BREAKDOWN Outcome: Progressing   Problem: RH SAFETY Goal: RH STG ADHERE TO SAFETY PRECAUTIONS W/ASSISTANCE/DEVICE Description: STG Adhere to Safety Precautions With  supervision Assistance/Device. Outcome: Progressing

## 2023-08-13 ENCOUNTER — Inpatient Hospital Stay (HOSPITAL_COMMUNITY)

## 2023-08-13 ENCOUNTER — Other Ambulatory Visit (HOSPITAL_COMMUNITY): Payer: Self-pay

## 2023-08-13 DIAGNOSIS — E875 Hyperkalemia: Secondary | ICD-10-CM | POA: Diagnosis not present

## 2023-08-13 DIAGNOSIS — N170 Acute kidney failure with tubular necrosis: Secondary | ICD-10-CM | POA: Diagnosis not present

## 2023-08-13 DIAGNOSIS — J9601 Acute respiratory failure with hypoxia: Secondary | ICD-10-CM | POA: Diagnosis not present

## 2023-08-13 DIAGNOSIS — E872 Acidosis, unspecified: Secondary | ICD-10-CM | POA: Diagnosis not present

## 2023-08-13 DIAGNOSIS — R0602 Shortness of breath: Secondary | ICD-10-CM | POA: Diagnosis not present

## 2023-08-13 DIAGNOSIS — R5381 Other malaise: Secondary | ICD-10-CM | POA: Diagnosis not present

## 2023-08-13 DIAGNOSIS — I4891 Unspecified atrial fibrillation: Secondary | ICD-10-CM | POA: Diagnosis not present

## 2023-08-13 DIAGNOSIS — I469 Cardiac arrest, cause unspecified: Secondary | ICD-10-CM | POA: Diagnosis not present

## 2023-08-13 DIAGNOSIS — K5900 Constipation, unspecified: Secondary | ICD-10-CM | POA: Diagnosis not present

## 2023-08-13 DIAGNOSIS — N179 Acute kidney failure, unspecified: Secondary | ICD-10-CM | POA: Diagnosis not present

## 2023-08-13 DIAGNOSIS — R570 Cardiogenic shock: Secondary | ICD-10-CM | POA: Diagnosis not present

## 2023-08-13 DIAGNOSIS — R1310 Dysphagia, unspecified: Secondary | ICD-10-CM | POA: Diagnosis not present

## 2023-08-13 DIAGNOSIS — D649 Anemia, unspecified: Secondary | ICD-10-CM | POA: Diagnosis not present

## 2023-08-13 DIAGNOSIS — R079 Chest pain, unspecified: Secondary | ICD-10-CM | POA: Diagnosis not present

## 2023-08-13 LAB — RENAL FUNCTION PANEL
Albumin: 2.8 g/dL — ABNORMAL LOW (ref 3.5–5.0)
Anion gap: 9 (ref 5–15)
BUN: 39 mg/dL — ABNORMAL HIGH (ref 6–20)
CO2: 22 mmol/L (ref 22–32)
Calcium: 8.7 mg/dL — ABNORMAL LOW (ref 8.9–10.3)
Chloride: 106 mmol/L (ref 98–111)
Creatinine, Ser: 3.3 mg/dL — ABNORMAL HIGH (ref 0.61–1.24)
GFR, Estimated: 24 mL/min — ABNORMAL LOW (ref >60)
Glucose, Bld: 91 mg/dL (ref 70–99)
Phosphorus: 5.5 mg/dL — ABNORMAL HIGH (ref 2.5–4.6)
Potassium: 4.2 mmol/L (ref 3.5–5.1)
Sodium: 137 mmol/L (ref 135–145)

## 2023-08-13 LAB — MAGNESIUM: Magnesium: 1.6 mg/dL — ABNORMAL LOW (ref 1.7–2.4)

## 2023-08-13 MED ORDER — OXYCODONE HCL 5 MG PO TABS
5.0000 mg | ORAL_TABLET | Freq: Four times a day (QID) | ORAL | 0 refills | Status: DC | PRN
  Filled 2023-08-13: qty 30, 5d supply, fill #0

## 2023-08-13 MED ORDER — MAGNESIUM GLUCONATE 500 (27 MG) MG PO TABS
500.0000 mg | ORAL_TABLET | Freq: Every day | ORAL | 0 refills | Status: DC
  Filled 2023-08-13: qty 30, 20d supply, fill #0

## 2023-08-13 MED ORDER — ALBUTEROL SULFATE HFA 108 (90 BASE) MCG/ACT IN AERS
2.0000 | INHALATION_SPRAY | Freq: Four times a day (QID) | RESPIRATORY_TRACT | 2 refills | Status: DC | PRN
  Filled 2023-08-13: qty 18, 25d supply, fill #0

## 2023-08-13 MED ORDER — ASCORBIC ACID 500 MG PO TABS
250.0000 mg | ORAL_TABLET | Freq: Two times a day (BID) | ORAL | 0 refills | Status: AC
  Filled 2023-08-13: qty 60, 60d supply, fill #0

## 2023-08-13 MED ORDER — ACETAMINOPHEN 325 MG PO TABS
650.0000 mg | ORAL_TABLET | Freq: Four times a day (QID) | ORAL | Status: DC | PRN
Start: 2023-08-13 — End: 2023-12-09

## 2023-08-13 MED ORDER — THIAMINE HCL 100 MG PO TABS
100.0000 mg | ORAL_TABLET | Freq: Every day | ORAL | 0 refills | Status: DC
  Filled 2023-08-13: qty 30, 30d supply, fill #0

## 2023-08-13 MED ORDER — MAGNESIUM GLUCONATE 500 (27 MG) MG PO TABS
500.0000 mg | ORAL_TABLET | Freq: Every day | ORAL | Status: DC
  Administered 2023-08-13 – 2023-08-14 (×2): 500 mg via ORAL
  Filled 2023-08-13 (×2): qty 1

## 2023-08-13 MED ORDER — FOLIC ACID 1 MG PO TABS
1.0000 mg | ORAL_TABLET | Freq: Every day | ORAL | 0 refills | Status: DC
  Filled 2023-08-13: qty 30, 30d supply, fill #0

## 2023-08-13 MED ORDER — METHIMAZOLE 10 MG PO TABS
10.0000 mg | ORAL_TABLET | Freq: Every day | ORAL | 0 refills | Status: DC
  Filled 2023-08-13: qty 30, 30d supply, fill #0

## 2023-08-13 MED ORDER — RENA-VITE PO TABS
1.0000 | ORAL_TABLET | Freq: Every day | ORAL | 0 refills | Status: DC
Start: 2023-08-13 — End: 2023-12-09
  Filled 2023-08-13: qty 30, 30d supply, fill #0

## 2023-08-13 MED ORDER — APIXABAN 5 MG PO TABS
5.0000 mg | ORAL_TABLET | Freq: Two times a day (BID) | ORAL | 0 refills | Status: DC
  Filled 2023-08-13: qty 60, 30d supply, fill #0

## 2023-08-13 MED ORDER — POLYETHYLENE GLYCOL 3350 17 G PO PACK
17.0000 g | PACK | Freq: Every day | ORAL | Status: DC
Start: 2023-08-14 — End: 2023-12-09

## 2023-08-13 MED ORDER — MAGNESIUM SULFATE 4 GM/100ML IV SOLN
4.0000 g | Freq: Once | INTRAVENOUS | Status: DC
  Filled 2023-08-13: qty 100

## 2023-08-13 NOTE — Plan of Care (Signed)
  Problem: RH BOWEL ELIMINATION Goal: RH STG MANAGE BOWEL WITH ASSISTANCE Description: STG Manage Bowel with  supervision Assistance. Outcome: Progressing   Problem: RH BLADDER ELIMINATION Goal: RH STG MANAGE BLADDER WITH ASSISTANCE Description: STG Manage Bladder With  supervision Assistance Outcome: Progressing   Problem: RH SKIN INTEGRITY Goal: RH STG SKIN FREE OF INFECTION/BREAKDOWN Outcome: Progressing   Problem: RH SAFETY Goal: RH STG ADHERE TO SAFETY PRECAUTIONS W/ASSISTANCE/DEVICE Description: STG Adhere to Safety Precautions With  supervision Assistance/Device. Outcome: Progressing   Problem: RH PAIN MANAGEMENT Goal: RH STG PAIN MANAGED AT OR BELOW PT'S PAIN GOAL Description: <4 w/ prns Outcome: Progressing   Problem: RH KNOWLEDGE DEFICIT Goal: RH STG INCREASE KNOWLEDGE OF STROKE PROPHYLAXIS Description: Manage increases  knowledge of stroke prophylaxis  with supervision assistance using educational materials provided Outcome: Progressing

## 2023-08-13 NOTE — Progress Notes (Signed)
 Case discussed with nephrologist. Pt will not require out-pt HD at d/c. Referral cancelled with [facility]. Update provided to CSW.   Olivia Canter Renal Navigator [phone]

## 2023-08-13 NOTE — Progress Notes (Signed)
 [facility] NEPHROLOGY PROGRESS NOTE  Assessment/ Plan: Pt is a 38 y.o. yo male  likely ATN after cardiac arrest, cardiogenic shock on ECMO ( decan 3/8) and Impella ( removed 3/12), AHRF/VDRF.  # Dialysis dependent anuric AKI 2/2 ATN from cardiogenic shock on CRRT 07/11/23 - 07/26/23, Left subclavian temp HD cath placed by CCM; Transitioned to [facility] on 3/17.   -s/p RIJ TDC by IR on 08/08/23.  -UOP has increased and labs showing what appears to be improving kidney function --> Had HD 3/28 then  Cr 4.7 3/29 > 4.3 3/30 > 3.8 3/31 > 3.7 4/1 > 3.3 4/2, baseline won't be known for a few months - will f/u outpt -IR consulted to d/c HD catheter - as below outpt f/u 2-4wks at [facility] office.   # Cardiogenic Shock,  Impella removed; status post transcutaneous mitral valve repair; previously on ECMO. Cardiology supportive of transition to rehab.   # VT + PEA SCA, as per cardiology   # PE - on apixiban now   # Normocytic Anemia -  on aranesp - Hb 10-11 now and given AKI appears improving d/c ESA   # Severe MR s/p TEER 3/5  Dispo:  nothing further to add, will sign off.  Arranging f/u [facility] [address] in 2-4 weeks. My office will reach out with date/time.  Pt aware.    Subjective: Seen and examined.  No new event.  Urine output is around 3.2L. Doing inpatient rehab well.  Denies nausea, vomiting, chest pain, shortness of breath.    Objective Vital signs in last 24 hours: Vitals:   08/12/23 0508 08/12/23 1336 08/12/23 1950 08/13/23 0524  BP: 128/82 (!) 133/91 (!) 124/94 125/85  Pulse: 92  94 93  Resp: 18 18 18 16   Temp: 98.4 F (36.9 C) 99.2 F (37.3 C) 98.3 F (36.8 C) 98.1 F (36.7 C)  TempSrc:      SpO2: 100% 100% 100% 100%  Weight:    84.8 kg  Height:       Weight change: -1.777 kg  Intake/Output Summary (Last 24 hours) at 08/13/2023 0817 Last data filed at 08/13/2023 4098 Gross per 24 hour  Intake 237 ml  Output 3250 ml  Net -3013 ml       Labs: RENAL  PANEL Recent Labs  Lab 08/09/23 0549 08/10/23 0736 08/11/23 0602 08/12/23 0538 08/13/23 0550  NA 137 138 139 140 137  K 4.0 4.2 3.8 4.3 4.2  CL 98 104 106 109 106  CO2 26 20* 22 17* 22  GLUCOSE 96 134* 88 81 91  BUN 36* 36* 34* 38* 39*  CREATININE 4.66* 4.27* 3.87* 3.71* 3.30*  CALCIUM 8.5* 8.6* 8.6* 8.7* 8.7*  MG  --   --   --   --  1.6*  PHOS 5.8* 5.6* 5.3* 4.9* 5.5*  ALBUMIN 2.6* 2.7* 2.8* 2.7* 2.8*    Liver Function Tests: Recent Labs  Lab 08/11/23 0602 08/12/23 0538 08/13/23 0550  ALBUMIN 2.8* 2.7* 2.8*   No results for input(s): "LIPASE", "AMYLASE" in the last 168 hours. No results for input(s): "AMMONIA" in the last 168 hours. CBC: Recent Labs    08/06/23 0559 08/07/23 0415 08/08/23 0438 08/09/23 0549 08/10/23 0736  HGB 10.6* 10.7* 10.5* 11.0* 10.9*  MCV 92.5 94.8 94.9 93.8 94.0    Cardiac Enzymes: No results for input(s): "CKTOTAL", "CKMB", "CKMBINDEX", "TROPONINI" in the last 168 hours. CBG: Recent Labs  Lab 08/08/23 [id number] 08/08/23 1205 08/08/23 1826 08/08/23 2105  08/09/23 0600  GLUCAP 94 159* 159* 158* 93    Iron Studies: No results for input(s): "IRON", "TIBC", "TRANSFERRIN", "FERRITIN" in the last 72 hours. Studies/Results: No results found.   Medications: Infusions:  magnesium sulfate bolus IVPB       Scheduled Medications:  apixaban  5 mg Oral BID   arformoterol  15 mcg Nebulization BID   ascorbic acid  250 mg Oral BID   budesonide (PULMICORT) nebulizer solution  0.25 mg Nebulization BID   Chlorhexidine Gluconate Cloth  6 each Topical Q12H   Chlorhexidine Gluconate Cloth  6 each Topical Q0600   folic acid  1 mg Oral Daily   methimazole  10 mg Oral Daily   multivitamin  1 tablet Oral QHS   polyethylene glycol  17 g Oral Daily   revefenacin  175 mcg Nebulization Daily   sodium chloride flush  10-40 mL Intracatheter Q12H   thiamine  100 mg Oral Daily    have reviewed scheduled and prn medications.  Physical  Exam: General:NAD, comfortable Heart:RRR, s1s2 nl Lungs:clear b/l, no crackle Abdomen:soft, Non-tender, non-distended Extremities:No edema Dialysis Access: Right IJ TDC in place  [place] A Purity Irmen 08/13/2023,8:17 AM  LOS: 9 days

## 2023-08-13 NOTE — Progress Notes (Signed)
 Patient refused CHG bath this morning, He stated that he will get it done later.

## 2023-08-13 NOTE — Procedures (Signed)
 Modified Barium Swallow Study  Patient Details  Name: Aaron [name] MRN: [id number] Date of Birth: 1986/02/08  Today's Date: 08/13/2023  Modified Barium Swallow completed.  Full report located under Chart Review in the Imaging Section.  History of Present Illness Aaron [name] is a 38 yo male presenting to ED 2/26 with chest pain and dyspnea. Admitted with bilateral lower lobe pulmonary emboli. VT/VF cardiac arrest 2/27, requiring 40 minutes of CPR and multiple shocks. Impella placed 2/27-3/12. ECMO initiated 2/27-3/8. CRRT stopped 3/15. ETT 2/27-3/12, reintubated 3/12-3/16 s/p mucus plugging and L lung opacification. 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 MBS 3/18 showed gross silent aspiration of thin and nectar thick liquids. SLP recommended pt start diet of Dys 3 solids with honey thick liquids and initiated use of EMST. PMH includes hypothyroidism, paroxysmal A-fib, severe mitral regurgitation  Clinical Impression Pt presents with oropharyngeal swallowing function that is grossly within functional limits with intact safety and efficiency across textures. Patient exhibited penetration above the level of the vocal folds which remained in the vestibule with first sip of thin liquid. This cleared with cued cough. No further intances of penetration/aspiration observed across consistencies. Trace residue coated pyriform sinuses after thin liquid trials however this largely cleared spontaneously. Recommend continuation of regular/thin liquid diet. Discontinue use of chin tuck strategy. Administer medications whole with thin liquids. SLP will sign off given return to normal swallowing function.  Factors that may increase risk of adverse event in presence of aspiration [name]:  n/a  Swallow Evaluation Recommendations Recommendations: PO diet PO Diet Recommendation: Regular;Thin liquids  (Level 0) Liquid Administration via: Cup;Straw Medication Administration: Whole meds with liquid Supervision: Patient able to self-feed Postural changes: Position pt fully upright for meals Oral care recommendations: Oral care BID (2x/day)  Jeannie Done, M.A., CCC-SLP  Yetta Barre 08/13/2023,9:46 AM

## 2023-08-13 NOTE — Progress Notes (Signed)
 Inpatient Rehabilitation Discharge Medication Review by a Pharmacist  A complete drug regimen review was completed for this patient to identify any potential clinically significant medication issues.  High Risk Drug Classes Is patient taking? Indication by Medication  Antipsychotic No   Anticoagulant Yes Apixaban - PE, CVA  Antibiotic No   Opioid Yes Oxycodone prn pain  Antiplatelet No   Hypoglycemics/insulin No   Vasoactive Medication No   Chemotherapy No   Other Yes Albuterol prn SOB Methimazole - thyroid     Type of Medication Issue Identified Description of Issue Recommendation(s)  Drug Interaction(s) (clinically significant)     Duplicate Therapy     Allergy     No Medication Administration End Date     Incorrect Dose     Additional Drug Therapy Needed     Significant med changes from prior encounter (inform family/care partners about these prior to discharge).    Other       Clinically significant medication issues were identified that warrant physician communication and completion of prescribed/recommended actions by midnight of the next day:  No  Name of provider notified for urgent issues identified:   Provider Method of Notification:     Pharmacist comments:   Time spent performing this drug regimen review (minutes): None  Thank you. Okey Regal, PharmD 08/13/2023 1:33 PM

## 2023-08-13 NOTE — Plan of Care (Signed)
   Problem: RH Swallowing Goal: LTG Patient will consume least restrictive diet using compensatory strategies with assistance (SLP) Description: LTG:  Patient will consume least restrictive diet using compensatory strategies with assistance (SLP) Outcome: Completed/Met   Problem: RH Problem Solving Goal: LTG Patient will demonstrate problem solving for (SLP) Description: LTG:  Patient will demonstrate problem solving for basic/complex daily situations with cues  (SLP) Outcome: Completed/Met   Problem: RH Memory Goal: LTG Patient will use memory compensatory aids to (SLP) Description: LTG:  Patient will use memory compensatory aids to recall biographical/new, daily complex information with cues (SLP) Outcome: Completed/Met

## 2023-08-13 NOTE — Progress Notes (Signed)
 Speech Language Pathology Discharge Summary  Patient Details  Name: Aaron [name] MRN: [id number] Date of Birth: [birth year]  Date of Discharge from SLP service:August 13, 2023  Patient has met 3 of 3 long term goals.  Patient to discharge at overall Modified Independent level.  Reasons goals not met: n/a   Clinical Impression/Discharge Summary: Patient has made excellent progress throughout CIR admission and has met all long term speech therapy goals. Patient utilizes memory strategies independently and solves complex problems with modI. Patient indicates that his processing speed is slowed compared to baseline with notes that it continues to improve. Patient continues to tolerate regular/thin liquid diet without difficulty and per most recent MBS, patient no longer requires compensatory chin tuck nor intermittent throat clear strategies. Given patient is at modified independent level for all speech related domains, no further ST services indicated at next venue of care. Patient educated complete. SLP will sign off.   Care Partner:  Caregiver Able to Provide Assistance: Other (comment) (none needed)     Recommendation:  None      Equipment: n/a   Reasons for discharge: Treatment goals met   Patient/Family Agrees with Progress Made and Goals Achieved: Yes   Jeannie Done, M.A., CCC-SLP  Aaron [name] 08/13/2023, 9:55 AM

## 2023-08-13 NOTE — Progress Notes (Signed)
 Patient ID: Aaron [name], male   DOB: 27-Mar-1986, 38 y.o.   MRN: [id number]  Met with pt to give team conference update regarding goals of mod/I level and discharge tomorrow. To remove HD cath today and be ready for discharge tomorrow. No need for OP-HD since kidneys have recovered. Asked if has medicaid he feels he does. Will contact financial counselor to confirm this.

## 2023-08-13 NOTE — Progress Notes (Signed)
 Occupational Therapy Discharge Summary  Patient Details  Name: AMISH MINTZER MRN: [id number] Date of Birth: 1986/04/17  Date of Discharge from OT service:August 13, 2023     Patient has met 14 of 14 long term goals due to improved activity tolerance, improved balance, postural control, ability to compensate for deficits, functional use of  RIGHT upper and LEFT upper extremity, improved awareness, and improved coordination.  Patient to discharge at overall Modified Independent level.  Patient's care partner is independent to provide the necessary physical assistance at discharge.    Reasons goals not met: N/A  Recommendation:  Patient will benefit from ongoing skilled OT services in outpatient setting to continue to advance functional skills in the area of BADL, iADL, and Vocation.  Equipment: Shower chair   Reasons for discharge: treatment goals met and discharge from hospital  Patient/family agrees with progress made and goals achieved: Yes  OT Discharge Precautions/Restrictions  Precautions Precautions: Other (comment) Precaution/Restrictions Comments: mild L hemi, Chronic L ankle pain Restrictions Weight Bearing Restrictions Per Provider Order: No Pain Pain Assessment Pain Scale: 0-10 Pain Score: 0-No pain ADL ADL Eating: Independent Where Assessed-Eating: Bed level Grooming: Independent Where Assessed-Grooming: Standing at sink Upper Body Bathing: Independent Where Assessed-Upper Body Bathing: Standing at sink Lower Body Bathing: Modified independent Where Assessed-Lower Body Bathing: Shower Upper Body Dressing: Independent Where Assessed-Upper Body Dressing: Edge of bed Lower Body Dressing: Modified independent Where Assessed-Lower Body Dressing: Edge of bed Toileting: Independent Where Assessed-Toileting: Toilet, Other (Comment) (urinal) Toilet Transfer: Community education officer Method: Ambulating Tub/Shower Transfer: Modified independent Glass blower/designer Method: Ship broker: Information systems manager with back Film/video editor: Psychologist, counselling Method: Designer, industrial/product: Information systems manager with back Vision Baseline Vision/History: 0 No visual deficits Patient Visual Report: No change from baseline Vision Assessment?: No apparent visual deficits Perception  Perception: Within Functional Limits Praxis Praxis: Impaired Praxis-Other Comments: BUE essential tremor Cognition Cognition Overall Cognitive Status: Impaired/Different from baseline Arousal/Alertness: Awake/alert Problem Solving: Impaired Safety/Judgment: Appears intact Brief Interview for Mental Status (BIMS) Repetition of Three Words (First Attempt): 3 Temporal Orientation: Year: Correct Temporal Orientation: Month: Accurate within 5 days Temporal Orientation: Day: Correct Recall: "Sock": Yes, no cue required Recall: "Blue": Yes, no cue required Recall: "Bed": Yes, no cue required BIMS Summary Score: 15 Sensation Sensation Light Touch: Impaired Detail Peripheral sensation comments: Reports of tingling in BUE/BLE. Light Touch Impaired Details: Impaired RUE;Impaired LUE;Impaired RLE;Impaired LLE Hot/Cold: Appears Intact Proprioception: Appears Intact Stereognosis: Appears Intact Coordination Gross Motor Movements are Fluid and Coordinated: No Fine Motor Movements are Fluid and Coordinated: No Coordination and Movement Description: bilateral essential tremor present Finger Nose Finger Test: Intact 9 Hole Peg Test: Left: 27.4". Right: 20.1" Motor  Motor Motor: Other (comment) Motor - Skilled Clinical Observations: Deficits due to generalized debility and B-hand tremors. Mobility  Bed Mobility Rolling Right: Independent Rolling Left: Independent Supine to Sit: Independent Sit to Supine: Independent Transfers Sit to Stand: Independent Stand to Sit: Independent  Trunk/Postural Assessment  Cervical  Assessment Cervical Assessment: Within Functional Limits Thoracic Assessment Thoracic Assessment: Within Functional Limits Lumbar Assessment Lumbar Assessment: Within Functional Limits Postural Control Postural Control: Within Functional Limits  Balance Balance Balance Assessed: Yes Static Sitting Balance Static Sitting - Balance Support: Feet supported Static Sitting - Level of Assistance: 7: Independent Dynamic Sitting Balance Dynamic Sitting - Balance Support: Feet supported;During functional activity Dynamic Sitting - Level of Assistance: 7: Independent Static Standing Balance Static Standing - Balance Support: During functional activity;No upper  extremity supported Static Standing - Level of Assistance: 7: Independent Dynamic Standing Balance Dynamic Standing - Balance Support: No upper extremity supported;During functional activity Dynamic Standing - Level of Assistance: 5: Stand by assistance Extremity/Trunk Assessment RUE Assessment Active Range of Motion (AROM) Comments: RUE shoulder A/ROM WFL; WNL in elbow and wrist. Slightly less than full range d/t HD port. General Strength Comments: 4-/5 shoulder flexion/abduction, 4+/5 shoulder IR/er, elbow flexion/extension. LUE Assessment Active Range of Motion (AROM) Comments: A/ROM WFL in all ranges. General Strength Comments: 4-/5 shoulder flexion/abduction, 4+/5 shoulder IR/er, elbow flexion/extension.   Limmie Patricia, OTR/L,CBIS  Supplemental OT - [facility] Secure Chat Preferred   08/13/2023, 7:34 AM

## 2023-08-13 NOTE — Progress Notes (Signed)
 Physical Therapy Discharge Summary  Patient Details  Name: Aaron [name] MRN: [id number] Date of Birth: February 24, 1986  Date of Discharge from PT service:August 13, 2023  Today's Date: 08/13/2023 PT Individual Time: 1003-1100 PT Individual Time Calculation (min): 57 min    Patient has met 10 of 10 long term goals due to improved activity tolerance, improved balance, increased strength, and ability to compensate for deficits.  Patient to discharge at an ambulatory level Independent.     Recommendation:  Patient will benefit from ongoing skilled PT services in outpatient setting to continue to advance safe functional mobility, address ongoing impairments in endurance, strength, higher level dynamic balance, and minimize fall risk.   Equipment: Shower chair  Reasons for discharge: treatment goals met and discharge from hospital  Patient/family agrees with progress made and goals achieved: Yes  PT Discharge Precautions/Restrictions Precautions Precautions: Other (comment) Precaution/Restrictions Comments: mild L hemi, Chronic L ankle pain Restrictions Weight Bearing Restrictions Per Provider Order: No Pain Interference Pain Interference Pain Effect on Sleep: 1. Rarely or not at all Pain Interference with Therapy Activities: 1. Rarely or not at all Pain Interference with Day-to-Day Activities: 1. Rarely or not at all Vision/Perception  Vision - History Ability to See in Adequate Light: 0 Adequate Perception Perception: Within Functional Limits Praxis Praxis: Impaired Praxis-Other Comments: BUE essential tremor  Cognition Overall Cognitive Status: Impaired/Different from baseline Arousal/Alertness: Awake/alert Orientation Level: Oriented X4 Year: 2025 Month: April Day of Week: Correct Attention: Sustained Sustained Attention: Appears intact Memory: Impaired Memory Impairment: Decreased short term memory Decreased Short Term Memory: Verbal basic;Functional basic Awareness:  Appears intact Awareness Impairment: Intellectual impairment Problem Solving: Impaired Problem Solving Impairment: Verbal basic;Functional basic Safety/Judgment: Appears intact Sensation Sensation Light Touch: Impaired Detail Peripheral sensation comments: Reports of tingling in BUE/BLE; L LE>RLE Light Touch Impaired Details: Impaired RUE;Impaired LUE;Impaired RLE;Impaired LLE Hot/Cold: Appears Intact Proprioception: Appears Intact Stereognosis: Appears Intact Coordination Gross Motor Movements are Fluid and Coordinated: No Fine Motor Movements are Fluid and Coordinated: No Coordination and Movement Description: bilateral essential tremor present Finger Nose Finger Test: Intact 9 Hole Peg Test: Left: 27.4". Right: 20.1" Motor  Motor Motor: Other (comment) Motor - Skilled Clinical Observations: Deficits due to generalized debility and B-hand tremors.  Mobility Bed Mobility Bed Mobility: Supine to Sit;Sit to Supine;Rolling Left;Rolling Right Rolling Right: Independent Rolling Left: Independent Supine to Sit: Independent Sit to Supine: Independent Transfers Transfers: Sit to Stand;Stand to Sit;Stand Pivot Transfers Sit to Stand: Independent Stand to Sit: Independent Stand Pivot Transfers: Independent Transfer (Assistive device): None Locomotion  Gait Ambulation: Yes Gait Assistance: Independent Gait Distance (Feet): 1000 Feet Assistive device: None Gait Gait: Yes Gait Pattern: Within Functional Limits High Level Ambulation High Level Ambulation: Side stepping;Backwards walking;Head turns;Other high level ambulation Side Stepping: mod I Backwards Walking: mod I Head Turns: mod I High Level Ambulation - Other Comments: tandem walking Stairs / Additional Locomotion Stairs: Yes Stairs Assistance: Independent Stair Management Technique: No rails;Alternating pattern Number of Stairs: 12 Height of Stairs: 6 Ramp: Independent Wheelchair Mobility Wheelchair Mobility: No   Trunk/Postural Assessment  Cervical Assessment Cervical Assessment: Within Functional Limits Thoracic Assessment Thoracic Assessment: Within Functional Limits Lumbar Assessment Lumbar Assessment: Within Functional Limits Postural Control Postural Control: Within Functional Limits  Balance Balance Balance Assessed: Yes Standardized Balance Assessment Standardized Balance Assessment: Berg Balance Test Berg Balance Test Sit to Stand: Able to stand without using hands and stabilize independently Standing Unsupported: Able to stand safely 2 minutes Sitting with Back Unsupported but Feet Supported  on Floor or Stool: Able to sit safely and securely 2 minutes Stand to Sit: Sits safely with minimal use of hands Transfers: Able to transfer safely, minor use of hands Standing Unsupported with Eyes Closed: Able to stand 10 seconds safely Standing Ubsupported with Feet Together: Able to place feet together independently and stand 1 minute safely From Standing, Reach Forward with Outstretched Arm: Can reach confidently >25 cm (10") From Standing Position, Pick up Object from Floor: Able to pick up shoe safely and easily From Standing Position, Turn to Look Behind Over each Shoulder: Looks behind from both sides and weight shifts well Turn 360 Degrees: Able to turn 360 degrees safely in 4 seconds or less Standing Unsupported, Alternately Place Feet on Step/Stool: Able to stand independently and safely and complete 8 steps in 20 seconds Standing Unsupported, One Foot in Front: Able to place foot tandem independently and hold 30 seconds Standing on One Leg: Able to lift leg independently and hold > 10 seconds Total Score: 56 Static Sitting Balance Static Sitting - Balance Support: Feet supported Static Sitting - Level of Assistance: 7: Independent Dynamic Sitting Balance Dynamic Sitting - Balance Support: Feet supported;During functional activity Dynamic Sitting - Level of Assistance: 7:  Independent Static Standing Balance Static Standing - Balance Support: During functional activity;No upper extremity supported Static Standing - Level of Assistance: 7: Independent Dynamic Standing Balance Dynamic Standing - Balance Support: No upper extremity supported;During functional activity Dynamic Standing - Level of Assistance: 5: Stand by assistance;6: Modified independent (Device/Increase time) Extremity Assessment  RUE Assessment Active Range of Motion (AROM) Comments: A/ROM WFL In all ranges General Strength Comments: 4/5 overall LUE Assessment Active Range of Motion (AROM) Comments: A/ROM WFL in all ranges. General Strength Comments: 4/5 overall RLE Assessment RLE Assessment: Exceptions to [facility] General Strength Comments: grossly 4/5 LLE Assessment LLE Assessment: Exceptions to [facility] General Strength Comments: grossly 4-/5  Today's Interventions  Pt supine in bed upon arrival. Pt agreeable to therapy. Pt denies any pain.   Discharge components completed, see above.   Patient demonstrates increased fall risk as noted by score of  56/56 on Berg Balance Scale.  (<36= high risk for falls, close to 100%; 37-45 significant >80%; 46-51 moderate >50%; 52-55 lower >25%).   Pt ambulated 391 meters during 6 min walk test with no AD independently.   Pt navigated 12 steps with no UE support and reciprocal gait indpendently.   Pt performed the following exercises for B LE strengthening/endurance/activity tolerance with prolonged seated rest breaks between 2/2 fatigue. Borg RPE assessed: 16.  2x5 squats Lateral monster walk x5 B Walking lunges x3 with CGA--verbal cues provided for technique-pt demos increased difficulty with R LE in front.   Handout provided for HEP:   Sit to stands with arms across chest  Squats to chair with arms across chest  Glute bridge holding for 5 seconds  Standing lunges with unilateral UE support on counter    Pt supine in bed at end of session with  all needs within reach.      [facility] [place], [place], DPT  08/13/2023, 10:14 AM

## 2023-08-13 NOTE — Progress Notes (Signed)
 PROGRESS NOTE   Subjective/Complaints:  No new complaints or concerns this AM.  Reports good progress with therapy.  Having good urine output.    ROS: Patient denies fever, new vision changes, dizziness,  vomiting, diarrhea,  shortness of breath or chest pain, headache, or mood change. Nausea- resolved  Objective:   DG Swallowing Func-Speech Pathology Result Date: 08/13/2023 Table formatting from the original result was not included. Modified Barium Swallow Study Patient Details Name: Aaron [name] MRN: [id number] Date of Birth: 1985/08/06 Today's Date: 08/13/2023 HPI/PMH: HPI: Aaron [name] is a 38 yo male presenting to ED 2/26 with chest pain and dyspnea. Admitted with bilateral lower lobe pulmonary emboli. VT/VF cardiac arrest 2/27, requiring 40 minutes of CPR and multiple shocks. Impella placed 2/27-3/12. ECMO initiated 2/27-3/8. CRRT stopped 3/15. ETT 2/27-3/12, reintubated 3/12-3/16 s/p mucus plugging and L lung opacification. 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 MBS 3/18 showed gross silent aspiration of thin and nectar thick liquids. SLP recommended pt start diet of Dys 3 solids with honey thick liquids and initiated use of EMST. PMH includes hypothyroidism, paroxysmal A-fib, severe mitral regurgitation Clinical Impression: Clinical Impression: Pt presents with oropharyngeal swallowing function that is grossly within functional limits with intact safety and efficiency across textures. Patient exhibited penetration above the level of the vocal folds which remained in the vestibule with first sip of thin liquid. This cleared with cued cough. No further intances of penetration/aspiration observed across consistencies. Trace residue coated pyriform sinuses after thin liquid trials however this largely cleared spontaneously. Recommend continuation of  regular/thin liquid diet. Discontinue use of chin tuck strategy. Administer medications whole with thin liquids. SLP will sign off given return to normal swallowing function. Factors that may increase risk of adverse event in presence of aspiration [name]: No data recorded Recommendations/Plan: Swallowing Evaluation Recommendations Swallowing Evaluation Recommendations Recommendations: PO diet PO Diet Recommendation: Regular; Thin liquids (Level 0) Liquid Administration via: Cup; Straw Medication Administration: Whole meds with liquid Supervision: Patient able to self-feed Postural changes: Position pt fully upright for meals Oral care recommendations: Oral care BID (2x/day) Treatment Plan Treatment Plan Treatment recommendations: No treatment recommended at this time Follow-up recommendations: No SLP follow up Functional status assessment: Patient has had a recent decline in their functional status and demonstrates the ability to make significant improvements in function in a reasonable and predictable amount of time. Recommendations Recommendations for follow up therapy are one component of a multi-disciplinary discharge planning process, led by the attending physician.  Recommendations may be updated based on patient status, additional functional criteria and insurance authorization. Assessment: Orofacial Exam: Orofacial Exam Oral Cavity: Oral Hygiene: WFL Oral Cavity - Dentition: Adequate natural dentition Orofacial Anatomy: WFL Oral Motor/Sensory Function: WFL Anatomy: Anatomy: WFL Boluses Administered: Boluses Administered Boluses Administered: Thin liquids (Level 0); Mildly thick liquids (Level 2, nectar thick); Moderately thick liquids (Level 3, honey thick); Solid; Puree  Oral Impairment Domain: Oral Impairment Domain Lip Closure: No labial escape Tongue control during bolus hold: Cohesive bolus between tongue to palatal seal Bolus preparation/mastication: Timely and efficient chewing and  mashing  Bolus transport/lingual motion: Brisk tongue motion Oral residue: Complete oral clearance Location of oral residue : N/A Initiation of pharyngeal swallow : Posterior angle of the ramus  Pharyngeal Impairment Domain: Pharyngeal Impairment Domain Soft palate elevation: No bolus between soft palate (SP)/pharyngeal wall (PW) Laryngeal elevation: Complete superior movement of thyroid cartilage with complete approximation of arytenoids to epiglottic petiole Anterior hyoid excursion: Complete anterior movement Epiglottic movement: Complete inversion Laryngeal vestibule closure: Incomplete, narrow column air/contrast in laryngeal vestibule Pharyngeal stripping wave : Present - complete Pharyngeal contraction (A/P view only): N/A Pharyngoesophageal segment opening: Complete distension and complete duration, no obstruction of flow Tongue base retraction: No contrast between tongue base and posterior pharyngeal wall (PPW) Pharyngeal residue: Trace residue within or on pharyngeal structures  Esophageal Impairment Domain: Esophageal Impairment Domain Esophageal clearance upright position: Complete clearance, esophageal coating Pill: Pill Consistency administered: Thin liquids (Level 0) Thin liquids (Level 0): [facility] Penetration/Aspiration Scale Score: Penetration/Aspiration Scale Score 1.  Material does not enter airway: Mildly thick liquids (Level 2, nectar thick); Moderately thick liquids (Level 3, honey thick); Puree; Solid; Pill 3.  Material enters airway, remains ABOVE vocal cords and not ejected out: Thin liquids (Level 0) Compensatory Strategies: Compensatory Strategies Compensatory strategies: No   General Information: Caregiver present: No  Diet Prior to this Study: Regular; Thin liquids (Level 0)   Temperature : Normal   Respiratory Status: WFL   Supplemental O2: None (Room air)   History of Recent Intubation: Yes  Behavior/Cognition: Alert; Cooperative; Pleasant mood Self-Feeding Abilities: Able to self-feed  Baseline vocal quality/speech: Dysphonic Volitional Cough: Able to elicit Volitional Swallow: Able to elicit Exam Limitations: No limitations Goal Planning: Prognosis for improved oropharyngeal function: Good No data recorded No data recorded Patient/Family Stated Goal: none stated Consulted and agree with results and recommendations: Patient Pain: Pain Assessment Pain Assessment: No/denies pain End of Session: Start Time:No data recorded Stop Time: No data recorded Time Calculation:No data recorded Charges: No data recorded SLP visit diagnosis: SLP Visit Diagnosis: Dysphagia, oropharyngeal phase (R13.12) Past Medical History: Past Medical History: Diagnosis Date  Atrial fibrillation (HCC)   Hyperthyroidism   Mitral regurgitation   NSTEMI (non-ST elevated myocardial infarction) [facility])  Past Surgical History: Past Surgical History: Procedure Laterality Date  ECMO CANNULATION N/A 07/10/2023  Procedure: ECMO CANNULATION;  Surgeon: Dolores Patty, MD;  Location: [facility] INVASIVE CV LAB;  Service: Cardiovascular;  Laterality: N/A;  EMBOLECTOMY Left 07/18/2023  Procedure: EMBOLECTOMY Left Femoral, Popliteal and iliac arterys,  Repair Left common femoral artery.;  Surgeon: Victorino Sparrow, MD;  Location[facility] OR;  Service: Vascular;  Laterality: Left;  INTRAOPERATIVE TRANSESOPHAGEAL ECHOCARDIOGRAM N/A 07/23/2023  Procedure: ECHOCARDIOGRAM, TRANSESOPHAGEAL, INTRAOPERATIVE;  Surgeon: Loreli Slot, MD;  Location: [facility] OR;  Service: Open Heart Surgery;  Laterality: N/A;  IR FLUORO GUIDE CV LINE RIGHT  08/08/2023  IR US GUIDE VASC ACCESS RIGHT  08/08/2023  LEFT HEART CATH AND CORONARY ANGIOGRAPHY N/A 10/22/2021  Procedure: LEFT HEART CATH AND CORONARY ANGIOGRAPHY;  Surgeon: Yvonne Kendall, MD;  Location: [facility] INVASIVE CV LAB;  Service: Cardiovascular;  Laterality: N/A;  PLACEMENT OF IMPELLA LEFT VENTRICULAR ASSIST DEVICE Right 07/14/2023  Procedure: PLACEMENT OF RIGHT AXILLARY IMPELLA 5.5 LEFT VENTRICULAR ASSIST DEVICE;  Surgeon:  Loreli Slot, MD;  Location: [facility] OR;  Service: Open Heart Surgery;  Laterality: Right;  REMOVAL OF IMPELLA LEFT VENTRICULAR ASSIST DEVICE Right 07/14/2023  Procedure: REMOVAL OF CP RIGHT FEMORAL IMPELLA LEFT VENTRICULAR ASSIST DEVICE;  Surgeon: Loreli Slot, MD;  Location: [facility] OR;  Service: Open Heart Surgery;  Laterality: Right;  REMOVAL OF IMPELLA LEFT VENTRICULAR ASSIST DEVICE N/A 07/23/2023  Procedure: REMOVAL, CARDIAC ASSIST DEVICE, IMPELLA;  Surgeon: Loreli Slot, MD;  Location: [facility] OR;  Service: Open Heart Surgery;  Laterality: N/A;  TRANSCATHETER MITRAL EDGE TO EDGE REPAIR N/A 07/16/2023  Procedure: TRANSCATHETER MITRAL EDGE TO EDGE REPAIR;  Surgeon: Tonny Bollman, MD;  Location: [facility] INVASIVE CV LAB;  Service: Cardiovascular;  Laterality: N/A;  TRANSESOPHAGEAL ECHOCARDIOGRAM (CATH LAB) N/A 07/16/2023  Procedure: TRANSESOPHAGEAL ECHOCARDIOGRAM;  Surgeon: Tonny Bollman, MD;  Location: [facility] INVASIVE CV LAB;  Service: Cardiovascular;  Laterality: N/A;  VENTRICULAR ASSIST DEVICE INSERTION N/A 07/10/2023  Procedure: VENTRICULAR ASSIST DEVICE INSERTION;  Surgeon: Dolores Patty, MD;  Location: [facility] INVASIVE CV LAB;  Service: Cardiovascular;  Laterality: N/A; Yetta Barre 08/13/2023, 9:49 AM    No results for input(s): "WBC", "HGB", "HCT", "PLT" in the last 72 hours.  Recent Labs    08/12/23 0538 08/13/23 0550  NA 140 137  K 4.3 4.2  CL 109 106  CO2 17* 22  GLUCOSE 81 91  BUN 38* 39*  CREATININE 3.71* 3.30*  CALCIUM 8.7* 8.7*    Intake/Output Summary (Last 24 hours) at 08/13/2023 1111 Last data filed at 08/13/2023 0653 Gross per 24 hour  Intake 237 ml  Output 2950 ml  Net -2713 ml         Physical Exam: Vital Signs Blood pressure 125/85, pulse 91, temperature 98.1 F (36.7 C), resp. rate 16, height 6\' 4"  (1.93 m), weight 84.8 kg, SpO2 100%.   General: NAD,  appears comfortable in bed Heart: RRR.  No murmurs, rubs, or gallops. Lungs: Clear to auscultation,  breathing unlabored, no rales or wheezes Abdomen: Positive bowel sounds-normoactive, soft nontender to palpation, nondistended Extremities: No clubbing, cyanosis, or edema Skin: No evidence of breakdown, no evidence of rash, right IJ [facility]  Neurologic: Cranial nerves II through XII intact, motor strength is 5/5 in bilateral deltoid, bicep, tricep, grip, 4+/5 hip flexor, knee extensors, ankle dorsiflexor and plantar flexor Sensory exam normal sensation to light touch in bilateral upper and lower extremities Musculoskeletal: Full range of motion in all 4 extremities. No joint swelling  Neurological:     Comments: Alert and oriented x 3, follows commands.  No apparent cognitive deficits. Unchanged 4-2  Assessment/Plan: 1. Functional deficits which require 3+ hours per day of interdisciplinary therapy in a comprehensive inpatient rehab setting. Physiatrist is providing close team supervision and 24 hour management of active medical problems listed below. Physiatrist and rehab team continue to assess barriers to discharge/monitor patient progress toward functional and medical goals  Care Tool:  Bathing    Body parts bathed by patient: Right arm, Left arm, Chest, Abdomen, Front perineal area, Buttocks, Right upper leg, Left upper leg, Face, Right lower leg, Left lower leg   Body parts bathed by helper: Right lower leg, Left lower leg Body parts n/a: Right lower leg, Left lower leg   Bathing assist Assist Level: Independent with assistive device     Upper Body Dressing/Undressing Upper body dressing   What is the patient wearing?: Pull over shirt    Upper body assist Assist Level: Independent    Lower Body Dressing/Undressing Lower body dressing      What is the patient wearing?: Underwear/pull up, Pants     Lower body assist Assist for lower body dressing: Independent with assitive device     Toileting Toileting    Toileting assist Assist for toileting: Independent  Transfers Chair/bed transfer  Transfers assist     Chair/bed transfer assist level: Independent     Locomotion Ambulation   Ambulation assist      Assist level: Supervision/Verbal cueing Assistive device: No Device Max distance: 55   Walk 10 feet activity   Assist     Assist level: Supervision/Verbal cueing Assistive device: No Device   Walk 50 feet activity   Assist    Assist level: Minimal Assistance - Patient > 75% Assistive device: Walker-rolling    Walk 150 feet activity   Assist Walk 150 feet activity did not occur: Safety/medical concerns (fatigue)  Assist level: Supervision/Verbal cueing Assistive device: No Device    Walk 10 feet on uneven surface  activity   Assist Walk 10 feet on uneven surfaces activity did not occur: Safety/medical concerns (fatigue)   Assist level: Supervision/Verbal cueing     Wheelchair     Assist Is the patient using a wheelchair?: No Type of Wheelchair: Manual    Wheelchair assist level: Dependent - Patient 0%      Wheelchair 50 feet with 2 turns activity    Assist        Assist Level: Dependent - Patient 0%   Wheelchair 150 feet activity     Assist      Assist Level: Dependent - Patient 0%   Blood pressure 125/85, pulse 91, temperature 98.1 F (36.7 C), resp. rate 16, height 6\' 4"  (1.93 m), weight 84.8 kg, SpO2 100%.  Medical Problem List and Plan: 1. Functional deficits secondary to debility/pulmonary emboli located by small infarct right cerebral and cerebellar hemispheres/hypoxic brain injury             -patient may not shower             -ELOS/Goals: 08/18/23  -Continue CIR, PT OT and SLP  -Therapy reports occasional tremors   -Consider earlier discharge, discussed with team  -Team conference today please see physician documentation under team conference tab, met with team  to discuss problems,progress, and goals. Formulized individual treatment plan based on medical  history, underlying problem and comorbidities.    2.  Antithrombotics: -DVT/anticoagulation:  Pharmaceutical: Heparin has been transitioned to Eliquis             -antiplatelet therapy: Aspirin 81 mg daily 3. Pain Management: Oxycodone as needed 4. Mood/Behavior/Sleep: Provide emotional support             -antipsychotic agents: N/A  -3/25 melatonin as needed ordered for insomnia  -improved with melatonin   3-30: Patient reports he is sleeping about 4 to 5 hours a night, has not been using as needed melatonin.  Encouraged use, patient does not want any medication scheduled.    3/31 continues to decline melatonin  5. Neuropsych/cognition: This patient is capable of making decisions on his own behalf. 6. Skin/Wound Care: Routine skin checks  -3-29: Asked nursing to place Mepilex on open area of right shoulder incision.  3-30: Wounds mostly healed per nursing, stage II on sacrum resolved   7. Fluids/Electrolytes/Nutrition: Routine in and outs with follow-up chemistries 8.  Cardiogenic shock/acute systolic congestive heart failure/A-fib/severe MR/SVT/PEA arrest.  ROSC x 40 minutes.  Follow-up cardiology services status post ECMO with Impella, decannulated 3/8 and Impella removed and extubated 3/12-reintubated but eventually extubated again 3/16.  Currently on amiodarone 200 mg daily  -3/25 HF team, cardiology plans to stop Amio before discharge.  Appreciate assistance  3/27 wt a little lower after dialysis yesterday  4/1 cardiology  has discontinued amiodarone, has been stable  4/2 Wts trending down, no signs overload   Filed Weights   08/11/23 1555 08/12/23 0500 08/13/23 0524  Weight: 86.6 kg 86.1 kg 84.8 kg    9.  Acute kidney injury due to ATN/hyperkalemia.  Follow-up renal services.  Required CRRT transition to hemodialysis  - 3/27 figured after HD yesterday, nephrology ordering tunneled cath, he is NPO for his  3/28 [facility] today, need temporary HD cath removed   3/31 BUN/creatinine  improving, reviewed nephrology not-May not need long-term dialysis outpatient  4/1 nephrology note reviewed, maintain TDC until at least tomorrow  4/2 Cr continues to improved, reviewed nephrology note,  IR consulted by renal to DC HD cath, f/u outpatient nephrology 10.  Normocytic anemia.  Follow-up CBC.  Continue Aranesp  -3/27 HGB stable at 10.7  Aranexp decreased and later discontinued by nephrology 11.  Transaminitis.  Likely shock liver.  Follow-up chemistries  -4/2 recheck tomorrow 12.  Decreased nutritional storage.  Dietary follow-up.  Follow-up MBS.  -3/25 by nutrition today 13.  Hypothyroidism.  Continue Tapazole 10 mg daily.  Need to check TSH in 4 weeks.  -4/1 cardiology recommends recheck thyroid test second week of April and endocrine follow-up outpatient 14.  Nausea, likely dialysis associated  -Will spread out medications.  Amiodarone may be contributing to this.  -2/27 will check abd xray- no acute findings 15. Dysphagia -Continue SLP   -4/2 MBS today, chin tuck was discontinued  16.  Constipation  -3/27 spent a lot of time on the commode trying to have a bowel movement, check abdomen x-ray and schedule MiraLAX  -4/2 consider additional medication if no BM today, he denies feeling constipated 17. Hyperglycemia?  -CBGs stable, Discontinue checks   -4/2 controlled on BMP  18. Hyperphosphatemia   - - 7.2 --> 5.8 3/29   - consider renvela 0.8 mg TID if downtrend does not continue  -3-30: Labs with Phos 5.6; appreciate nephrology assistance in management, since naturally downtrending will hold off of treatment at this time    LOS: 9 days A FACE TO FACE EVALUATION WAS PERFORMED  Fanny Dance 08/13/2023, 11:11 AM

## 2023-08-13 NOTE — Patient Care Conference (Signed)
 Inpatient RehabilitationTeam Conference and Plan of Care Update Date: 08/13/2023   Time: 1209 pm    Patient Name: Aaron [name]      Medical Record Number: [id number]  Date of Birth: [birth year] Sex: Male         Room/Bed: 4W11C/4W11C-01 Payor Info: Payor: [place] MEDICAID PREPAID HEALTH PLAN / Plan: [place] MEDICAID [facility] / Product Type: *No Product type* /    Admit Date/Time:  08/04/2023  4:27 PM  Primary Diagnosis:  Debility  Hospital Problems: Principal Problem:   Debility Active Problems:   Normocytic anemia   Nausea   Insomnia   Atrial fibrillation (HCC)   Malnutrition of moderate degree   AKI (acute kidney injury) (HCC)   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 Fanny Dance Social Worker Present: Dossie Der, LCSW Nurse Present: Konrad Dolores, RN PT Present: [name], PT OT Present: Limmie Patricia, OT SLP Present: Jeannie Done, SLP PPS Coordinator present : Fae Pippin, SLP     Current Status/Progress Goal Weekly Team Focus  Bowel/Bladder   Patient is continent of bowel and bladder. LBM 3/31   Remain continent   Remain continent, assess q shift and PRN    Swallow/Nutrition/ Hydration   regular/thin, modI   mod I  weekly goals met, patient discharged from ST services    ADL's   Independent in room. Mod I UB/LB ADL, SBA functional transfers no AD out of room.   Mod I   discharge planning. Ready to discharge from therapy standpoint    Mobility   pt is at goal level.   independent  follow up OPPT for return to work, and endurance. D/C asap. if pt is going to need dialysis he will need a transport chair (purchasing OOP).    Communication                Safety/Cognition/ Behavioral Observations  modI   mod i   all goals met, discharged from ST services    Pain   Patient denied pain or  discomfort   Patient will remain pain free   Assess pain q shift and PRN    Skin   Skin is intact   Skin will rewmain intact  Assess skin Q shift and PRN      Discharge Planning:  Home with girlfreind has done well, awaiting nephrology to decide if will need OP-HD. [facility] -Disability application maed    Team Discussion: Patient was admitted with  debility due to pulmonary emboli located by small infarct right cerebral and cerebellar hemispheres/hypoxic brain injury. Patient limited by bilateral tremors and muscle spasms in both hands. Patient currently improving, no need for dialysis.    Patient on target to meet rehab goals: yes, patient is independent in room.  Patient is at goal level for discharge.  *See Care Plan and progress notes for long and short-term goals.   Revisions to Treatment Plan:  Dialysis catheter removal   Teaching Needs: Safety, medications, transfers, toileting, etc.    Current Barriers to Discharge: Decreased caregiver support  Possible Resolutions to Barriers: Family education Outpatient follow-up DME: Careers information officer    Medical Summary Current Status: debility,hypoxic brain injury, CHF, AKI, dysphagia,  Barriers to Discharge: Cardiac Complications;Medical stability;Self-care education  Barriers to Discharge Comments: debility,hypoxic brain injury, CHF, AKI, dysphagia, Possible Resolutions to [employer] Focus: monitor fluid status, dialysis cath removal,  MBS today   Continued Need for Acute Rehabilitation Level of Care: The patient requires daily medical management by a physician with specialized training in physical medicine and rehabilitation for the following reasons: Direction of a multidisciplinary physical rehabilitation program to maximize functional independence : Yes Medical management of patient stability for increased activity during participation in an intensive rehabilitation regime.: Yes Analysis of  laboratory values and/or radiology reports with any subsequent need for medication adjustment and/or medical intervention. : Yes   I attest that I was present, lead the team conference, and concur with the assessment and plan of the team.   Konrad Dolores 08/13/2023, 1209 pm

## 2023-08-13 NOTE — Plan of Care (Signed)
  Problem: RH Balance Goal: LTG Patient will maintain dynamic sitting balance (PT) Description: LTG:  Patient will maintain dynamic sitting balance with assistance during mobility activities (PT) Outcome: Completed/Met Goal: LTG Patient will maintain dynamic standing balance (PT) Description: LTG:  Patient will maintain dynamic standing balance with assistance during mobility activities (PT) Outcome: Completed/Met   Problem: RH Bed Mobility Goal: LTG Patient will perform bed mobility with assist (PT) Description: LTG: Patient will perform bed mobility with assistance, with/without cues (PT). Outcome: Completed/Met   Problem: RH Bed to Chair Transfers Goal: LTG Patient will perform bed/chair transfers w/assist (PT) Description: LTG: Patient will perform bed to chair transfers with assistance (PT). Outcome: Completed/Met   Problem: RH Car Transfers Goal: LTG Patient will perform car transfers with assist (PT) Description: LTG: Patient will perform car transfers with assistance (PT). Outcome: Completed/Met   Problem: RH Ambulation Goal: LTG Patient will ambulate in home environment (PT) Description: LTG: Patient will ambulate in home environment, # of feet with assistance (PT). Outcome: Completed/Met Goal: LTG Patient will ambulate in community environment (PT) Description: LTG: Patient will ambulate in community environment, # of feet with assistance (PT). Outcome: Completed/Met

## 2023-08-14 ENCOUNTER — Inpatient Hospital Stay (HOSPITAL_COMMUNITY)

## 2023-08-14 ENCOUNTER — Other Ambulatory Visit (HOSPITAL_COMMUNITY): Payer: Self-pay

## 2023-08-14 DIAGNOSIS — K5900 Constipation, unspecified: Secondary | ICD-10-CM | POA: Diagnosis not present

## 2023-08-14 DIAGNOSIS — R739 Hyperglycemia, unspecified: Secondary | ICD-10-CM | POA: Diagnosis not present

## 2023-08-14 DIAGNOSIS — Z4901 Encounter for fitting and adjustment of extracorporeal dialysis catheter: Secondary | ICD-10-CM | POA: Diagnosis not present

## 2023-08-14 DIAGNOSIS — R7401 Elevation of levels of liver transaminase levels: Secondary | ICD-10-CM | POA: Diagnosis not present

## 2023-08-14 DIAGNOSIS — N179 Acute kidney failure, unspecified: Secondary | ICD-10-CM | POA: Diagnosis not present

## 2023-08-14 DIAGNOSIS — R5381 Other malaise: Secondary | ICD-10-CM | POA: Diagnosis not present

## 2023-08-14 HISTORY — PX: IR REMOVAL TUN CV CATH W/O FL: IMG2289

## 2023-08-14 LAB — RENAL FUNCTION PANEL
Albumin: 3.1 g/dL — ABNORMAL LOW (ref 3.5–5.0)
Anion gap: 12 (ref 5–15)
BUN: 38 mg/dL — ABNORMAL HIGH (ref 6–20)
CO2: 20 mmol/L — ABNORMAL LOW (ref 22–32)
Calcium: 8.9 mg/dL (ref 8.9–10.3)
Chloride: 106 mmol/L (ref 98–111)
Creatinine, Ser: 2.78 mg/dL — ABNORMAL HIGH (ref 0.61–1.24)
GFR, Estimated: 29 mL/min — ABNORMAL LOW (ref >60)
Glucose, Bld: 85 mg/dL (ref 70–99)
Phosphorus: 5 mg/dL — ABNORMAL HIGH (ref 2.5–4.6)
Potassium: 4.6 mmol/L (ref 3.5–5.1)
Sodium: 138 mmol/L (ref 135–145)

## 2023-08-14 LAB — COMPREHENSIVE METABOLIC PANEL WITH GFR
ALT: 18 U/L (ref 0–44)
AST: 14 U/L — ABNORMAL LOW (ref 15–41)
Albumin: 3.1 g/dL — ABNORMAL LOW (ref 3.5–5.0)
Alkaline Phosphatase: 140 U/L — ABNORMAL HIGH (ref 38–126)
Anion gap: 11 (ref 5–15)
BUN: 39 mg/dL — ABNORMAL HIGH (ref 6–20)
CO2: 21 mmol/L — ABNORMAL LOW (ref 22–32)
Calcium: 8.9 mg/dL (ref 8.9–10.3)
Chloride: 106 mmol/L (ref 98–111)
Creatinine, Ser: 2.87 mg/dL — ABNORMAL HIGH (ref 0.61–1.24)
GFR, Estimated: 28 mL/min — ABNORMAL LOW (ref >60)
Glucose, Bld: 85 mg/dL (ref 70–99)
Potassium: 4.6 mmol/L (ref 3.5–5.1)
Sodium: 138 mmol/L (ref 135–145)
Total Bilirubin: 0.7 mg/dL (ref 0.0–1.2)
Total Protein: 7.7 g/dL (ref 6.5–8.1)

## 2023-08-14 NOTE — Progress Notes (Signed)
 PROGRESS NOTE   Subjective/Complaints:  No new concerns or complaints today.  Waiting for his line to be removed so he can go home.   ROS: Patient denies fever, new vision changes, dizziness,  vomiting, diarrhea,  shortness of breath or chest pain, headache, or mood change. Nausea- resolved  Objective:   DG Swallowing Func-Speech Pathology Result Date: 08/13/2023 Table formatting from the original result was not included. Modified Barium Swallow Study Patient Details Name: Aaron [name] MRN: [id number] Date of Birth: 1986/04/26 Today's Date: 08/13/2023 HPI/PMH: HPI: Aaron [name] is a 38 yo male presenting to ED 2/26 with chest pain and dyspnea. Admitted with bilateral lower lobe pulmonary emboli. VT/VF cardiac arrest 2/27, requiring 40 minutes of CPR and multiple shocks. Impella placed 2/27-3/12. ECMO initiated 2/27-3/8. CRRT stopped 3/15. ETT 2/27-3/12, reintubated 3/12-3/16 s/p mucus plugging and L lung opacification. 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 MBS 3/18 showed gross silent aspiration of thin and nectar thick liquids. SLP recommended pt start diet of Dys 3 solids with honey thick liquids and initiated use of EMST. PMH includes hypothyroidism, paroxysmal A-fib, severe mitral regurgitation Clinical Impression: Clinical Impression: Pt presents with oropharyngeal swallowing function that is grossly within functional limits with intact safety and efficiency across textures. Patient exhibited penetration above the level of the vocal folds which remained in the vestibule with first sip of thin liquid. This cleared with cued cough. No further intances of penetration/aspiration observed across consistencies. Trace residue coated pyriform sinuses after thin liquid trials however this largely cleared spontaneously. Recommend continuation of  regular/thin liquid diet. Discontinue use of chin tuck strategy. Administer medications whole with thin liquids. SLP will sign off given return to normal swallowing function. Factors that may increase risk of adverse event in presence of aspiration [name]: No data recorded Recommendations/Plan: Swallowing Evaluation Recommendations Swallowing Evaluation Recommendations Recommendations: PO diet PO Diet Recommendation: Regular; Thin liquids (Level 0) Liquid Administration via: Cup; Straw Medication Administration: Whole meds with liquid Supervision: Patient able to self-feed Postural changes: Position pt fully upright for meals Oral care recommendations: Oral care BID (2x/day) Treatment Plan Treatment Plan Treatment recommendations: No treatment recommended at this time Follow-up recommendations: No SLP follow up Functional status assessment: Patient has had a recent decline in their functional status and demonstrates the ability to make significant improvements in function in a reasonable and predictable amount of time. Recommendations Recommendations for follow up therapy are one component of a multi-disciplinary discharge planning process, led by the attending physician.  Recommendations may be updated based on patient status, additional functional criteria and insurance authorization. Assessment: Orofacial Exam: Orofacial Exam Oral Cavity: Oral Hygiene: WFL Oral Cavity - Dentition: Adequate natural dentition Orofacial Anatomy: WFL Oral Motor/Sensory Function: WFL Anatomy: Anatomy: WFL Boluses Administered: Boluses Administered Boluses Administered: Thin liquids (Level 0); Mildly thick liquids (Level 2, nectar thick); Moderately thick liquids (Level 3, honey thick); Solid; Puree  Oral Impairment Domain: Oral Impairment Domain Lip Closure: No labial escape Tongue control during bolus hold: Cohesive bolus between tongue to palatal seal Bolus preparation/mastication: Timely and efficient chewing and  mashing  Bolus transport/lingual motion: Brisk tongue motion Oral residue: Complete oral clearance Location of oral residue : N/A Initiation of pharyngeal swallow : Posterior angle of the ramus  Pharyngeal Impairment Domain: Pharyngeal Impairment Domain Soft palate elevation: No bolus between soft palate (SP)/pharyngeal wall (PW) Laryngeal elevation: Complete superior movement of thyroid cartilage with complete approximation of arytenoids to epiglottic petiole Anterior hyoid excursion: Complete anterior movement Epiglottic movement: Complete inversion Laryngeal vestibule closure: Incomplete, narrow column air/contrast in laryngeal vestibule Pharyngeal stripping wave : Present - complete Pharyngeal contraction (A/P view only): N/A Pharyngoesophageal segment opening: Complete distension and complete duration, no obstruction of flow Tongue base retraction: No contrast between tongue base and posterior pharyngeal wall (PPW) Pharyngeal residue: Trace residue within or on pharyngeal structures  Esophageal Impairment Domain: Esophageal Impairment Domain Esophageal clearance upright position: Complete clearance, esophageal coating Pill: Pill Consistency administered: Thin liquids (Level 0) Thin liquids (Level 0): [facility] Penetration/Aspiration Scale Score: Penetration/Aspiration Scale Score 1.  Material does not enter airway: Mildly thick liquids (Level 2, nectar thick); Moderately thick liquids (Level 3, honey thick); Puree; Solid; Pill 3.  Material enters airway, remains ABOVE vocal cords and not ejected out: Thin liquids (Level 0) Compensatory Strategies: Compensatory Strategies Compensatory strategies: No   General Information: Caregiver present: No  Diet Prior to this Study: Regular; Thin liquids (Level 0)   Temperature : Normal   Respiratory Status: WFL   Supplemental O2: None (Room air)   History of Recent Intubation: Yes  Behavior/Cognition: Alert; Cooperative; Pleasant mood Self-Feeding Abilities: Able to self-feed  Baseline vocal quality/speech: Dysphonic Volitional Cough: Able to elicit Volitional Swallow: Able to elicit Exam Limitations: No limitations Goal Planning: Prognosis for improved oropharyngeal function: Good No data recorded No data recorded Patient/Family Stated Goal: none stated Consulted and agree with results and recommendations: Patient Pain: Pain Assessment Pain Assessment: No/denies pain End of Session: Start Time:No data recorded Stop Time: No data recorded Time Calculation:No data recorded Charges: No data recorded SLP visit diagnosis: SLP Visit Diagnosis: Dysphagia, oropharyngeal phase (R13.12) Past Medical History: Past Medical History: Diagnosis Date  Atrial fibrillation (HCC)   Hyperthyroidism   Mitral regurgitation   NSTEMI (non-ST elevated myocardial infarction) [facility])  Past Surgical History: Past Surgical History: Procedure Laterality Date  ECMO CANNULATION N/A 07/10/2023  Procedure: ECMO CANNULATION;  Surgeon: Dolores Patty, MD;  Location: [facility] INVASIVE CV LAB;  Service: Cardiovascular;  Laterality: N/A;  EMBOLECTOMY Left 07/18/2023  Procedure: EMBOLECTOMY Left Femoral, Popliteal and iliac arterys,  Repair Left common femoral artery.;  Surgeon: Victorino Sparrow, MD;  Location: [facility] OR;  Service: Vascular;  Laterality: Left;  INTRAOPERATIVE TRANSESOPHAGEAL ECHOCARDIOGRAM N/A 07/23/2023  Procedure: ECHOCARDIOGRAM, TRANSESOPHAGEAL, INTRAOPERATIVE;  Surgeon: Loreli Slot, MD;  Location: [facility] OR;  Service: Open Heart Surgery;  Laterality: N/A;  IR FLUORO GUIDE CV LINE RIGHT  08/08/2023  IR US GUIDE VASC ACCESS RIGHT  08/08/2023  LEFT HEART CATH AND CORONARY ANGIOGRAPHY N/A 10/22/2021  Procedure: LEFT HEART CATH AND CORONARY ANGIOGRAPHY;  Surgeon: Yvonne Kendall, MD;  Location: [facility] INVASIVE CV LAB;  Service: Cardiovascular;  Laterality: N/A;  PLACEMENT OF IMPELLA LEFT VENTRICULAR ASSIST DEVICE Right 07/14/2023  Procedure: PLACEMENT OF RIGHT AXILLARY IMPELLA 5.5 LEFT VENTRICULAR ASSIST DEVICE;  Surgeon:  Loreli Slot, MD;  Location: [facility] OR;  Service: Open Heart Surgery;  Laterality: Right;  REMOVAL OF IMPELLA LEFT VENTRICULAR ASSIST DEVICE Right 07/14/2023  Procedure: REMOVAL OF CP RIGHT FEMORAL IMPELLA LEFT VENTRICULAR ASSIST DEVICE;  Surgeon: Loreli Slot, MD;  Location: [facility] OR;  Service: Open Heart Surgery;  Laterality: Right;  REMOVAL OF IMPELLA LEFT VENTRICULAR ASSIST DEVICE N/A 07/23/2023  Procedure: REMOVAL, CARDIAC ASSIST DEVICE, IMPELLA;  Surgeon: Loreli Slot, MD;  Location: [facility] OR;  Service: Open Heart Surgery;  Laterality: N/A;  TRANSCATHETER MITRAL EDGE TO EDGE REPAIR N/A 07/16/2023  Procedure: TRANSCATHETER MITRAL EDGE TO EDGE REPAIR;  Surgeon: Tonny Bollman, MD;  Location: [facility] INVASIVE CV LAB;  Service: Cardiovascular;  Laterality: N/A;  TRANSESOPHAGEAL ECHOCARDIOGRAM (CATH LAB) N/A 07/16/2023  Procedure: TRANSESOPHAGEAL ECHOCARDIOGRAM;  Surgeon: Tonny Bollman, MD;  Location: [facility] INVASIVE CV LAB;  Service: Cardiovascular;  Laterality: N/A;  VENTRICULAR ASSIST DEVICE INSERTION N/A 07/10/2023  Procedure: VENTRICULAR ASSIST DEVICE INSERTION;  Surgeon: Dolores Patty, MD;  Location: [facility] INVASIVE CV LAB;  Service: Cardiovascular;  Laterality: N/A; Yetta Barre 08/13/2023, 9:49 AM    No results for input(s): "WBC", "HGB", "HCT", "PLT" in the last 72 hours.  Recent Labs    08/13/23 0550 08/14/23 0636  NA 137 138  138  K 4.2 4.6  4.6  CL 106 106  106  CO2 22 21*  20*  GLUCOSE 91 85  85  BUN 39* 39*  38*  CREATININE 3.30* 2.87*  2.78*  CALCIUM 8.7* 8.9  8.9    Intake/Output Summary (Last 24 hours) at 08/14/2023 1427 Last data filed at 08/14/2023 0600 Gross per 24 hour  Intake 960 ml  Output 2200 ml  Net -1240 ml         Physical Exam: Vital Signs Blood pressure (!) 140/92, pulse 92, temperature 98.2 F (36.8 C), resp. rate 18, height 6\' 4"  (1.93 m), weight 85.1 kg, SpO2 99%.   General: NAD,  appears comfortable in bed Heart: RRR.  No murmurs,  rubs, or gallops. Lungs: Clear to auscultation, breathing unlabored, no rales or wheezes Abdomen: Positive bowel sounds-normoactive, soft nontender to palpation, nondistended Extremities: No clubbing, cyanosis, or edema Skin: No evidence of breakdown, no evidence of rash, right IJ TDC-still in place today  Neurologic: Cranial nerves II through XII intact, motor strength is 5/5 in bilateral deltoid, bicep, tricep, grip, 4+/5 hip flexor, knee extensors, ankle dorsiflexor and plantar flexor Sensory exam normal sensation to light touch in bilateral upper and lower extremities Musculoskeletal: Full range of motion in all 4 extremities.  No joint TTP note  Neurological:     Comments: Alert and oriented x 3, follows commands.  No apparent cognitive deficits. Unchanged 4-3  Assessment/Plan: 1. Functional deficits which require 3+ hours per day of interdisciplinary therapy in a comprehensive inpatient rehab setting. Physiatrist is providing close team supervision and 24 hour management of active medical problems listed below. Physiatrist and rehab team continue to assess barriers to discharge/monitor patient progress toward functional and medical goals  Care Tool:  Bathing    Body parts bathed by patient: Right arm, Left arm, Chest, Abdomen, Front perineal area, Buttocks, Right upper leg, Left upper leg, Face, Right lower leg, Left lower leg   Body parts bathed by helper: Right lower leg, Left lower leg Body parts n/a: Right lower leg, Left lower leg   Bathing assist Assist Level: Independent with assistive device     Upper Body Dressing/Undressing Upper body dressing   What is the patient wearing?: Pull over shirt    Upper body assist Assist Level: Independent    Lower Body Dressing/Undressing Lower body dressing      What is the patient wearing?: Underwear/pull up, Pants     Lower body assist Assist for lower  body dressing: Independent with assitive device      Toileting Toileting    Toileting assist Assist for toileting: Independent     Transfers Chair/bed transfer  Transfers assist     Chair/bed transfer assist level: Independent     Locomotion Ambulation   Ambulation assist      Assist level: Independent Assistive device: No Device Max distance: 1000+   Walk 10 feet activity   Assist     Assist level: Independent Assistive device: No Device   Walk 50 feet activity   Assist    Assist level: Independent Assistive device: No Device    Walk 150 feet activity   Assist Walk 150 feet activity did not occur: Safety/medical concerns (fatigue)  Assist level: Independent Assistive device: No Device    Walk 10 feet on uneven surface  activity   Assist Walk 10 feet on uneven surfaces activity did not occur: Safety/medical concerns (fatigue)   Assist level: Independent     Wheelchair     Assist Is the patient using a wheelchair?: No Type of Wheelchair: Manual    Wheelchair assist level: Dependent - Patient 0%      Wheelchair 50 feet with 2 turns activity    Assist        Assist Level: Dependent - Patient 0%   Wheelchair 150 feet activity     Assist      Assist Level: Dependent - Patient 0%   Blood pressure (!) 140/92, pulse 92, temperature 98.2 F (36.8 C), resp. rate 18, height 6\' 4"  (1.93 m), weight 85.1 kg, SpO2 99%.  Medical Problem List and Plan: 1. Functional deficits secondary to debility/pulmonary emboli located by small infarct right cerebral and cerebellar hemispheres/hypoxic brain injury             -patient may not shower             -ELOS/Goals: 08/18/23  -Continue CIR, PT OT and SLP  -Therapy reports occasional tremors   -Consider earlier discharge, discussed with team  -Discharge home today   2.  Antithrombotics: -DVT/anticoagulation:  Pharmaceutical: Heparin has been transitioned to Eliquis             -antiplatelet therapy: Aspirin 81 mg daily 3. Pain  Management: Oxycodone as needed 4. Mood/Behavior/Sleep: Provide emotional support             -antipsychotic agents: N/A  -3/25 melatonin as needed ordered for insomnia  -improved with melatonin   3-30: Patient reports he is sleeping about 4 to 5 hours a night, has not been using as needed melatonin.  Encouraged use, patient does not want any medication scheduled.    3/31 continues to decline melatonin  5. Neuropsych/cognition: This patient is capable of making decisions on his own behalf. 6. Skin/Wound Care: Routine skin checks  -3-29: Asked nursing to place Mepilex on open area of right shoulder incision.  3-30: Wounds mostly healed per nursing, stage II on sacrum resolved   7. Fluids/Electrolytes/Nutrition: Routine in and outs with follow-up chemistries 8.  Cardiogenic shock/acute systolic congestive heart failure/A-fib/severe MR/SVT/PEA arrest.  ROSC x 40 minutes.  Follow-up cardiology services status post ECMO with Impella, decannulated 3/8 and Impella removed and extubated 3/12-reintubated but eventually extubated again 3/16.  Currently on amiodarone 200 mg daily  -3/25 HF team, cardiology plans to stop Amio before discharge.  Appreciate assistance  3/27 wt a little lower after dialysis yesterday  4/1 cardiology has discontinued amiodarone, has been stable  4/3 weight stable, no  signs of overload   Filed Weights   08/12/23 0500 08/13/23 0524 08/14/23 0615  Weight: 86.1 kg 84.8 kg 85.1 kg    9.  Acute kidney injury due to ATN/hyperkalemia.  Follow-up renal services.  Required CRRT transition to hemodialysis  - 3/27 figured after HD yesterday, nephrology ordering tunneled cath, he is NPO for his  3/28 [facility] today, need temporary HD cath removed   3/31 BUN/creatinine improving, reviewed nephrology not-May not need long-term dialysis outpatient  4/1 nephrology note reviewed, maintain TDC until at least tomorrow  4/2 Cr continues to improved, reviewed nephrology note,  IR consulted by  renal to DC HD cath, f/u outpatient nephrology  4/3 creatinine down to 2.87, continues to improve.  Discussed not taking NSAIDs as this can be nephrotoxic 10.  Normocytic anemia.  Follow-up CBC.  Continue Aranesp  -3/27 HGB stable at 10.7  Aranexp decreased and later discontinued by nephrology 11.  Transaminitis.  Likely shock liver.  Follow-up chemistries  -4/3 improved on labs, follow-up with PCP 12.  Decreased nutritional storage.  Dietary follow-up.  Follow-up MBS.  -3/25 by nutrition today 13.  Hypothyroidism.  Continue Tapazole 10 mg daily.  Need to check TSH in 4 weeks.  -4/1 cardiology recommends recheck thyroid test second week of April and endocrine follow-up outpatient 14.  Nausea, likely dialysis associated  -Will spread out medications.  Amiodarone may be contributing to this.  -2/27 will check abd xray- no acute findings 15. Dysphagia -Continue SLP   -4/2 MBS today, chin tuck was discontinued  16.  Constipation  -3/27 spent a lot of time on the commode trying to have a bowel movement, check abdomen x-ray and schedule MiraLAX  -4/2 consider additional medication if no BM today, he denies feeling constipated 17. Hyperglycemia?  -CBGs stable, Discontinue checks   -4/3 glucose remained stable on CMP  18. Hyperphosphatemia   - - 7.2 --> 5.8 3/29   - consider renvela 0.8 mg TID if downtrend does not continue  -3-30: Labs with Phos 5.6; appreciate nephrology assistance in management, since naturally downtrending will hold off of treatment at this time    LOS: 10 days A FACE TO FACE EVALUATION WAS PERFORMED  Fanny Dance 08/14/2023, 2:27 PM

## 2023-08-14 NOTE — Plan of Care (Signed)
  Problem: RH BOWEL ELIMINATION Goal: RH STG MANAGE BOWEL WITH ASSISTANCE Description: STG Manage Bowel with  supervision Assistance. Outcome: Progressing   Problem: RH BLADDER ELIMINATION Goal: RH STG MANAGE BLADDER WITH ASSISTANCE Description: STG Manage Bladder With  supervision Assistance Outcome: Progressing   Problem: RH SKIN INTEGRITY Goal: RH STG SKIN FREE OF INFECTION/BREAKDOWN Outcome: Progressing   Problem: RH SAFETY Goal: RH STG ADHERE TO SAFETY PRECAUTIONS W/ASSISTANCE/DEVICE Description: STG Adhere to Safety Precautions With  supervision Assistance/Device. Outcome: Progressing   Problem: RH PAIN MANAGEMENT Goal: RH STG PAIN MANAGED AT OR BELOW PT'S PAIN GOAL Description: <4 w/ prns Outcome: Progressing   Problem: RH KNOWLEDGE DEFICIT Goal: RH STG INCREASE KNOWLEDGE OF STROKE PROPHYLAXIS Description: Manage increases  knowledge of stroke prophylaxis  with supervision assistance using educational materials provided Outcome: Progressing

## 2023-08-14 NOTE — Progress Notes (Addendum)
 Inpatient Rehabilitation Care Coordinator Discharge Note   Patient Details  Name: Aaron [name] MRN: [id number] Date of Birth: [birth year]   Discharge location: HOME WITH GIRLFRIEND AND SISTER TO ASSIST  Length of Stay: 10 DAYS  Discharge activity level: INDEPENDENT LEVEL  Home/community participation: ACTIVE  Patient response WU:JWJXBJ Literacy - How often do you need to have someone help you when you read instructions, pamphlets, or other written material from your doctor or pharmacy?: Never  Patient response YN:WGNFAO Isolation - How often do you feel lonely or isolated from those around you?: Rarely  Services provided included: MD, RD, PT, OT, SLP, RN, CM, Pharmacy, Neuropsych, SW  Financial Services:  Field seismologist Utilized: Other (Comment) ([employer])    Choices offered to/list presented to: PT  Follow-up services arranged:  Outpatient, DME, Patient/Family has no preference for HH/DME agencies    Outpatient Servicies: [place] [facility] WILL CALL TO SET UP FOLLOW UP APPOINTMENTS DME : [facility]  PT ACTUALLY LIVES IN  HAVE SENT REFERRAL TO [facility]  Patient response to transportation need: Is the patient able to respond to transportation needs?: Yes In the past 12 months, has lack of transportation kept you from medical appointments or from getting medications?: No In the past 12 months, has lack of transportation kept you from meetings, work, or from getting things needed for daily living?: No   Patient/Family verbalized understanding of follow-up arrangements:  Yes  Individual responsible for coordination of the follow-up plan: [name]-GIRLFRIEND [phone]  Confirmed correct DME delivered: Lucy Chris 08/14/2023    Comments (or additional information):PT DID WELL AND RECOVERED, READY TO GO HOME  Summary of Stay    Date/Time Discharge Planning CSW  08/13/23 0850 Home with girlfreind has done well,  awaiting nephrology to decide if will need OP-HD. [facility] -Disability application maed RGD  08/06/23 0920 Home with girlfriend who can be there if needed, his sister is able to assist also. 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

## 2023-08-19 ENCOUNTER — Telehealth (HOSPITAL_COMMUNITY): Payer: Self-pay | Admitting: Cardiology

## 2023-08-19 NOTE — Telephone Encounter (Signed)
 Called to confirm/remind patient of their appointment at [facility] on 08/20/[facility].   Appointment:   [x] Confirmed  [] Left mess   [] No answer/No voice mail  [] Phone not in service  Patient reminded to bring all medications and/or complete list.  Confirmed patient has transportation. Gave directions, instructed to utilize valet parking.

## 2023-08-20 ENCOUNTER — Ambulatory Visit (HOSPITAL_COMMUNITY)
Admit: 2023-08-20 | Discharge: 2023-08-20 | Disposition: A | Discharge status: Home / Self Care | Attending: Cardiology | Admitting: Cardiology

## 2023-08-20 ENCOUNTER — Encounter (HOSPITAL_COMMUNITY): Payer: Self-pay | Admitting: Cardiology

## 2023-08-20 VITALS — BP 140/100 | HR 88 | Wt 202.0 lb

## 2023-08-20 DIAGNOSIS — Z86711 Personal history of pulmonary embolism: Secondary | ICD-10-CM | POA: Insufficient documentation

## 2023-08-20 DIAGNOSIS — Z7901 Long term (current) use of anticoagulants: Secondary | ICD-10-CM | POA: Diagnosis not present

## 2023-08-20 DIAGNOSIS — I5032 Chronic diastolic (congestive) heart failure: Secondary | ICD-10-CM

## 2023-08-20 DIAGNOSIS — Z79899 Other long term (current) drug therapy: Secondary | ICD-10-CM | POA: Diagnosis not present

## 2023-08-20 DIAGNOSIS — E0500 Thyrotoxicosis with diffuse goiter without thyrotoxic crisis or storm: Secondary | ICD-10-CM | POA: Diagnosis not present

## 2023-08-20 DIAGNOSIS — I5022 Chronic systolic (congestive) heart failure: Secondary | ICD-10-CM | POA: Diagnosis not present

## 2023-08-20 DIAGNOSIS — R251 Tremor, unspecified: Secondary | ICD-10-CM | POA: Diagnosis not present

## 2023-08-20 DIAGNOSIS — I340 Nonrheumatic mitral (valve) insufficiency: Secondary | ICD-10-CM | POA: Diagnosis not present

## 2023-08-20 DIAGNOSIS — Z8674 Personal history of sudden cardiac arrest: Secondary | ICD-10-CM | POA: Diagnosis not present

## 2023-08-20 LAB — BASIC METABOLIC PANEL WITH GFR
Anion gap: 8 (ref 5–15)
BUN: 19 mg/dL (ref 6–20)
CO2: 20 mmol/L — ABNORMAL LOW (ref 22–32)
Calcium: 8.7 mg/dL — ABNORMAL LOW (ref 8.9–10.3)
Chloride: 110 mmol/L (ref 98–111)
Creatinine, Ser: 1.73 mg/dL — ABNORMAL HIGH (ref 0.61–1.24)
GFR, Estimated: 51 mL/min — ABNORMAL LOW (ref >60)
Glucose, Bld: 95 mg/dL (ref 70–99)
Potassium: 5.3 mmol/L — ABNORMAL HIGH (ref 3.5–5.1)
Sodium: 138 mmol/L (ref 135–145)

## 2023-08-20 MED ORDER — METOPROLOL SUCCINATE ER 25 MG PO TB24: 12.5000 mg | ORAL_TABLET | Freq: Every day | ORAL | 3 refills | Status: DC

## 2023-08-20 NOTE — Progress Notes (Signed)
 ADVANCED HEART FAILURE FOLLOW UP CLINIC NOTE  Referring Physician: No ref. provider found  Primary Care: Patient, No Pcp Per Primary Cardiologist:  HPI: Aaron [name] is a 38 y.o. male who presents for follow up of recent .      Patient with a longstanding history of primary mitral regurgitation with reduced ejection fraction as well as Grave's disease.  He had had progressive heart failure as an outpatient.  He was admitted in February 2025 for progressive shortness of breath.  He was found to have a small pulmonary embolism without significant right heart strain.  He was given fluids, nodal blockade for A-fib with RVR and subsequently became altered, hypoxic, with ultimate cardiac arrest.  ECMO team was consulted and patient was emergently taken to [facility] for ECMO cannulation with Impella CP drainage.  He was upgraded to a 5.5 and given his severe mitral regurgitation was arranged to have MitraClip performed.  Following MitraClip he was able to come off hemodynamic support.  Course complicated by acute renal failure requiring dialysis that slowly improved over the course of his admission.  He went to CIR and was discharged without hemodialysis catheter.    SUBJECTIVE:  Patient accompanied by his fiance. He reports that overall he is doing fairly well. He has had a tremor since he left the hospital, per his fiance it gets worse when he is anxious or hasn't eaten enough. He feels that his energy level is improving, continues to urinate without issue and has had no problems with chest pain or orthopnea. We discussed his valvular disease and the need for long term management of his endocrine condition.   PMH, current medications, allergies, social history, and family history reviewed in epic.  PHYSICAL EXAM: Vitals:   08/20/23 1354  BP: (!) 140/100  Pulse: 88  SpO2: 100%   GENERAL: Well nourished and in no apparent distress at rest.  PULM:  Normal work of breathing, clear to  auscultation bilaterally. Respirations are unlabored.  CARDIAC:  JVP: flat         Normal rate with regular rhythm. No murmurs, rubs or gallops.  No edema. Warm and well perfused extremities. ABDOMEN: Soft, non-tender, non-distended. NEUROLOGIC: Patient is oriented x3 with no focal or lateralizing neurologic deficits.    DATA REVIEW  ECG: 08/20/23: NSR, rightward axis, normal QRS    ECHO: 03/2023: LVEF 45-50%, severe MR with bileaflet prolapse, myxomatous valve with flail segment 07/2023: LVEF 40-45%, mildly decreased function, LVH, normal RV function, mild MS with mean gradient 6, PVA 1.6cm2, 3 mitraclip in appropriate position  CATH: 10/2021: Normal coronary angiography  CMR:  10/23/2021: LVEF 49%, small apical transmural LGE consistent with scar  Heart failure review: - Classification: Heart failure with mildly reduced EF - Etiology: Valvular - NYHA Class: II - Volume status: Euvolemic - ACEi/ARB/ARNI: Plan to start at a subsequent visit - Aldosterone antagonist: Not indicated - Beta-blocker: Currently up-titrating - Digoxin: Not indicated - Hydralazine/Nitrates: Not indicated - SGLT2i: Plan to start at a subsequent visit - GLP-1: Not a candidate - Advanced therapies: Not needed at this time - ICD: EF improved  ASSESSMENT & PLAN:  Chronic systolic heart failure: Midrange ejection fraction, last EF 40-45% following mitraclip. Had a LHC in 2023 without ischemic disease, reviewed personally without evidence of SCAD or early LAD cut off, CMR with small apical infarct pattern. Renal disease improving, euvolemic.  - Start metoprolol 12.5mg  daily - BP mildly elevated, but not checking at home. Home BP readings  at next visit - Consider ARB/SGLT-2 at next visit  Mitral valve disease: Primary MR, s/p bailout mitraclip x3 after cardiac arrest. Mild MS noted on previous echocardiogram but with elevated HR. Discussed potential need for valve replacement in the future. - Serial echo,  follow up in structural clinic  CKD: Developed during prolonged hospitalization with hemolysis initially required CRRT. Renal recovery at discharge. Repeat renal labs today. - BMP following hospitalization - SGLT-2 in the future  Grave's disease: Long standing history, likely contributing to initial symptoms. TSH detectable at last check. Needs close endocrine follow up. - Endocrinology referral - Continue methimazole 10mg  daily  Atrial fibrillation: Suspected in the setting of thyrotoxicosis and critical illness. Now back in NSR. Given small apical infarct suspect embolic disease history as well.  - Continue apixaban 5mg  BID   Follow up in 2 months  Clearnce Hasten, MD Advanced Heart Failure Mechanical Circulatory Support 08/20/23

## 2023-08-20 NOTE — Patient Instructions (Signed)
 START Metoprolol 12.5 mg ( 1/2 Tab) daily.  Labs done today, your results will be available in MyChart, we will contact you for abnormal readings.  You have been referred to Endocrinology. They will call you to arrange your appointment.  Your physician recommends that you schedule a follow-up appointment in: 2 months.  If you have any questions or concerns before your next appointment please send [place] a message through [place] or call our office at [phone].    TO LEAVE A MESSAGE FOR THE NURSE SELECT OPTION 2, PLEASE LEAVE A MESSAGE INCLUDING: YOUR NAME DATE OF BIRTH CALL BACK NUMBER REASON FOR CALL**this is important as we prioritize the call backs  YOU WILL RECEIVE A CALL BACK THE SAME DAY AS LONG AS YOU CALL BEFORE 4:00 PM  At the [facility], you and your health needs are our priority. As part of our continuing mission to provide you with exceptional heart care, we have created designated Provider Care Teams. These Care Teams include your primary Cardiologist (physician) and Advanced Practice Providers (APPs- Physician Assistants and Nurse Practitioners) who all work together to provide you with the care you need, when you need it.   You may see any of the following providers on your designated Care Team at your next follow up: Dr Arvilla Meres Dr Marca Ancona Dr. Dorthula Nettles Dr. Clearnce Hasten Amy Filbert Schilder, NP Robbie Lis, [place] [facility] [place], [place] Brynda Peon, NP [place] Lee, NP Clarisa Kindred, NP Karle Plumber, PharmD Enos Fling, PharmD   Please be sure to bring in all your medications bottles to every appointment.    Thank you for choosing [facility]

## 2023-09-01 ENCOUNTER — Encounter: Payer: Self-pay | Admitting: Occupational Therapy

## 2023-09-01 ENCOUNTER — Ambulatory Visit: Attending: Physical Medicine and Rehabilitation | Admitting: Occupational Therapy

## 2023-09-01 ENCOUNTER — Other Ambulatory Visit: Payer: Self-pay

## 2023-09-01 DIAGNOSIS — R251 Tremor, unspecified: Secondary | ICD-10-CM | POA: Diagnosis not present

## 2023-09-01 DIAGNOSIS — R41842 Visuospatial deficit: Secondary | ICD-10-CM | POA: Diagnosis not present

## 2023-09-01 DIAGNOSIS — R269 Unspecified abnormalities of gait and mobility: Secondary | ICD-10-CM | POA: Insufficient documentation

## 2023-09-01 DIAGNOSIS — R29818 Other symptoms and signs involving the nervous system: Secondary | ICD-10-CM | POA: Insufficient documentation

## 2023-09-01 DIAGNOSIS — R29898 Other symptoms and signs involving the musculoskeletal system: Secondary | ICD-10-CM | POA: Diagnosis not present

## 2023-09-01 DIAGNOSIS — R278 Other lack of coordination: Secondary | ICD-10-CM | POA: Insufficient documentation

## 2023-09-01 DIAGNOSIS — R208 Other disturbances of skin sensation: Secondary | ICD-10-CM | POA: Insufficient documentation

## 2023-09-01 NOTE — Therapy (Signed)
 OUTPATIENT OCCUPATIONAL THERAPY NEURO EVALUATION  Patient Name: Aaron [name] MRN: [id number] DOB:August 19, 1985, 38 y.o., male Today's Date: 09/01/2023  PCP: No PCP per pt  REFERRING PROVIDER: Zelda Hickman, PA-C   END OF SESSION:  OT End of Session - 09/01/23 1450     Visit Number 1    Authorization Type [place] Medicaid    OT Start Time 1449    Activity Tolerance Patient tolerated treatment well    Behavior During Therapy WFL for tasks assessed/performed             Past Medical History:  Diagnosis Date   Atrial fibrillation (HCC)    Hyperthyroidism    Mitral regurgitation    NSTEMI (non-ST elevated myocardial infarction) [facility])    Past Surgical History:  Procedure Laterality Date   ECMO CANNULATION N/A 07/10/2023   Procedure: ECMO CANNULATION;  Surgeon: Mardell Shade, MD;  Location: [facility] INVASIVE CV LAB;  Service: Cardiovascular;  Laterality: N/A;   EMBOLECTOMY Left 07/18/2023   Procedure: EMBOLECTOMY Left Femoral, Popliteal and iliac arterys,  Repair Left common femoral artery.;  Surgeon: Kayla Part, MD;  Location: [facility] OR;  Service: Vascular;  Laterality: Left;   INTRAOPERATIVE TRANSESOPHAGEAL ECHOCARDIOGRAM N/A 07/23/2023   Procedure: ECHOCARDIOGRAM, TRANSESOPHAGEAL, INTRAOPERATIVE;  Surgeon: Zelphia Higashi, MD;  Location: [facility] OR;  Service: Open Heart Surgery;  Laterality: N/A;   IR FLUORO GUIDE CV LINE RIGHT  08/08/2023   IR REMOVAL TUN CV CATH W/O FL  08/14/2023   IR US  GUIDE VASC ACCESS RIGHT  08/08/2023   LEFT HEART CATH AND CORONARY ANGIOGRAPHY N/A 10/22/2021   Procedure: LEFT HEART CATH AND CORONARY ANGIOGRAPHY;  Surgeon: Sammy Crisp, MD;  Location: [facility] INVASIVE CV LAB;  Service: Cardiovascular;  Laterality: N/A;   PLACEMENT OF IMPELLA LEFT VENTRICULAR ASSIST DEVICE Right 07/14/2023   Procedure: PLACEMENT OF RIGHT AXILLARY IMPELLA 5.5 LEFT VENTRICULAR ASSIST DEVICE;  Surgeon: Zelphia Higashi, MD;  Location: [facility] OR;  Service: Open Heart Surgery;   Laterality: Right;   REMOVAL OF IMPELLA LEFT VENTRICULAR ASSIST DEVICE Right 07/14/2023   Procedure: REMOVAL OF CP RIGHT FEMORAL IMPELLA LEFT VENTRICULAR ASSIST DEVICE;  Surgeon: Zelphia Higashi, MD;  Location: [facility] OR;  Service: Open Heart Surgery;  Laterality: Right;   REMOVAL OF IMPELLA LEFT VENTRICULAR ASSIST DEVICE N/A 07/23/2023   Procedure: REMOVAL, CARDIAC ASSIST DEVICE, IMPELLA;  Surgeon: Zelphia Higashi, MD;  Location: [facility] OR;  Service: Open Heart Surgery;  Laterality: N/A;   TRANSCATHETER MITRAL Aaron TO Aaron REPAIR N/A 07/16/2023   Procedure: TRANSCATHETER MITRAL Aaron TO Aaron REPAIR;  Surgeon: Arnoldo Lapping, MD;  Location: [facility] INVASIVE CV LAB;  Service: Cardiovascular;  Laterality: N/A;   TRANSESOPHAGEAL ECHOCARDIOGRAM (CATH LAB) N/A 07/16/2023   Procedure: TRANSESOPHAGEAL ECHOCARDIOGRAM;  Surgeon: Arnoldo Lapping, MD;  Location: [facility] INVASIVE CV LAB;  Service: Cardiovascular;  Laterality: N/A;   VENTRICULAR ASSIST DEVICE INSERTION N/A 07/10/2023   Procedure: VENTRICULAR ASSIST DEVICE INSERTION;  Surgeon: Mardell Shade, MD;  Location: [facility] INVASIVE CV LAB;  Service: Cardiovascular;  Laterality: N/A;   Patient Active Problem List   Diagnosis Date Noted   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) 08/11/2023   AKI (acute kidney injury) (HCC) 08/08/2023   Normocytic anemia 08/05/2023   Nausea 08/05/2023   Insomnia 08/05/2023   Atrial fibrillation (HCC) 08/05/2023   Malnutrition of moderate degree 08/05/2023   Debility 08/04/2023   Protein-calorie malnutrition, severe 07/23/2023   Palliative care by specialist 07/21/2023   Cardiac  arrest (HCC) 07/10/2023   Cardiogenic shock (HCC) 07/10/2023   Hyperkalemia 07/10/2023   On mechanically assisted ventilation (HCC) 07/10/2023   Acute respiratory failure with hypoxia (HCC) 07/10/2023   Bilateral pulmonary embolism (HCC) 07/09/2023   Chronic heart failure with preserved ejection fraction (HFpEF) (HCC)  07/09/2023   Severe mitral regurgitation 07/09/2023   Paroxysmal atrial fibrillation (HCC) 07/09/2023   Atrial fibrillation with rapid ventricular response (HCC) 03/20/2023   Chest pain 03/30/2022   Essential hypertension 03/30/2022   Hypomagnesemia 03/30/2022   Hyperthyroidism 10/23/2021   Elevated troponin 10/19/2021   NSTEMI (non-ST elevated myocardial infarction) (HCC) 10/19/2021    ONSET DATE: 08/13/2023 (referral date)  REFERRING DIAG: I63.9: Cerebral infarction, unspecified   THERAPY DIAG:  Other lack of coordination  Visuospatial deficit  Other symptoms and signs involving the musculoskeletal system  Other symptoms and signs involving the nervous system  Other disturbances of skin sensation  Tremor  Rationale for Evaluation and Treatment: Rehabilitation  SUBJECTIVE:   SUBJECTIVE STATEMENT: Pt reports he was able to do eyebrow fade on Friday but it required increased time, bimanual support, and he had to make sure he was calm to reduce the affects of his B tremors. States this tremor has now improved to occurring only when he is trying to do something.   2 recent episodes where vision got really blurry/double vision.   Pt accompanied by: self Fiance stayed in the lobby  PERTINENT HISTORY: chronic systolic heart failure, mitral valve disease, CKD, Grave's disease, atrial fibrillation  Per 08/20/23 MD Progress Notes: "Patient with a longstanding history of primary mitral regurgitation with reduced ejection fraction as well as Grave's disease.  He had had progressive heart failure as an outpatient.  He was admitted in February 2025 for progressive shortness of breath.  He was found to have a small pulmonary embolism without significant right heart strain.  He was given fluids, nodal blockade for A-fib with RVR and subsequently became altered, hypoxic, with ultimate cardiac arrest.   ECMO team was consulted and patient was emergently taken to [facility] for ECMO  cannulation with Impella CP drainage.  He was upgraded to a 5.5 and given his severe mitral regurgitation was arranged to have MitraClip performed.  Following MitraClip he was able to come off hemodynamic support.  Course complicated by acute renal failure requiring dialysis that slowly improved over the course of his admission.  He went to CIR and was discharged without hemodialysis catheter"  PRECAUTIONS: Fall  WEIGHT BEARING RESTRICTIONS: No  PAIN:  Are you having pain? Yes: NPRS scale: 4/10 Pain location: incisions Pain description: sore Aggravating factors: unknown Relieving factors: rest  FALLS: Has patient fallen in last 6 months? No  LIVING ENVIRONMENT: Family/patient expects to be discharged to:: Private residence Living Arrangements: Spouse/significant other Available Help at Discharge: Family;Available 24 hours/day Type of Home: House Home Access: Level entry Home Layout: One level Bathroom Shower/Tub: Engineer, manufacturing systems: Standard Home Equipment: None  PLOF: Independent;Working/employed;Driving; works as a Paediatric nurse   PATIENT GOALS: return to work/PLOF  OBJECTIVE:  Note: Objective measures were completed at Evaluation unless otherwise noted.  HAND DOMINANCE: Right  ADLs: Overall ADLs: mod I  IADLs: Shopping: mod A Light housekeeping: mod I Meal Prep: mod I Community mobility: dependent Medication management: mod I Financial management: mod I Handwriting: 90% legible; has not tried typing  MOBILITY STATUS: Independent  ACTIVITY TOLERANCE: Activity tolerance: fair  FUNCTIONAL OUTCOME MEASURES: PSFS: 5.7 total score  Total score = sum of the activity  scores/number of activities Minimum detectable change (90%CI) for average score = 2 points Minimum detectable change (90%CI) for single activity score = 3 points   UPPER EXTREMITY ROM and MMT:    BUE WNL though poor endurance  HAND FUNCTION: Grip strength: Right: 113.7 lbs; Left: 116.8  lbs  COORDINATION: 9 Hole Peg test: Right: 25 sec; Left: 35 sec  SENSATION: Paresthesias reported B  EDEMA: none reported or observed  MUSCLE TONE:BUE WFL  COGNITION: Overall cognitive status: Within functional limits for tasks assessed  VISION: Subjective report: some recent episodes of blurry/diplopia that has resolved Baseline vision: No visual deficits Visual history:  none  VISION ASSESSMENT: WFL  PERCEPTION: WFL  PRAXIS: WFL  OBSERVATIONS: Mild B tremor. Appears well-kept. No AD for ambulation, no LOB.                                                                                                                            TREATMENT:    OT educated pt on recommendations as noted below. This time was not billed as pt has Medicaid.   Sleep 8-10 hours, keep normal work hours 8 am - 5/6pm Timer for 10 min to signify when to take a break Press photographer  PATIENT EDUCATION: Education details: OT role and POC; improving rest/sleep and coordination Person educated: Patient Education method: Explanation Education comprehension: verbalized understanding  HOME EXERCISE PROGRAM: N/A for this visit  GOALS:  SHORT TERM GOALS: Target date: 09/29/2023  Patient will demonstrate initial BUE HEP with 25% verbal cues or less for proper execution. Baseline: Goal status: INITIAL  2.  Patient will return demonstration with use of DME as needed for tremor reduction.  Baseline:  Goal status: INITIAL  3.  Patient will independently verbalize at least 3 energy conservation principles in relation to ADLs to increase functional independence. Baseline:  Goal status: INITIAL  4.  Patient will independently recall at least 2 vision strategies without cueing. Baseline:  Goal status: INITIAL  5.  Pt will independently recall the 5 main sensory precautions (cold, heat, sharp, chemical, and heavy) as needed to prevent injury/harm secondary to impairments.    LONG TERM GOALS:  Target date: 10/24/2023   Patient will demonstrate updated BUE HEP with visual handouts only for proper execution. Baseline:  Goal status: INITIAL  2.  Patient will report at least two-point increase in average PSFS score or at least three-point increase in a single activity score indicating functionally significant improvement given minimum detectable change.  Baseline: 5.7 total score (See above for individual activity scores) Baseline:  Goal status: INITIAL  ASSESSMENT:  CLINICAL IMPRESSION: Patient is a 38 y.o. male who was seen today for occupational therapy evaluation for CVA. Hx includes infarcts in the right cerebral and cerebellar hemispheres, chronic systolic heart failure, mitral valve disease, CKD, Grave's disease, atrial fibrillation, and cardiac arrest during hospital stay in February 2025. Patient currently presents below baseline level of functioning demonstrating functional deficits and impairments as  noted below. Pt would benefit from skilled OT services in the outpatient setting to work on impairments as noted below to help pt return to PLOF as able.    PERFORMANCE DEFICITS: in functional skills including ADLs, IADLs, coordination, dexterity, Fine motor control, endurance, decreased knowledge of precautions, decreased knowledge of use of DME, vision, and UE functional use.   IMPAIRMENTS: are limiting patient from ADLs, IADLs, rest and sleep, work, leisure, and social participation.   CO-MORBIDITIES: may have co-morbidities  that affects occupational performance. Patient will benefit from skilled OT to address above impairments and improve overall function.  MODIFICATION OR ASSISTANCE TO COMPLETE EVALUATION: Min-Moderate modification of tasks or assist with assess necessary to complete an evaluation.  OT OCCUPATIONAL PROFILE AND HISTORY: Detailed assessment: Review of records and additional review of physical, cognitive, psychosocial history related to current functional  performance.  CLINICAL DECISION MAKING: Moderate - several treatment options, min-mod task modification necessary  REHAB POTENTIAL: Good  EVALUATION COMPLEXITY: Moderate    PLAN:  OT FREQUENCY: 2x/week  OT DURATION: 6 weeks  PLANNED INTERVENTIONS: 97168 OT Re-evaluation, 97535 self care/ADL training, 16109 therapeutic exercise, 97530 therapeutic activity, 97112 neuromuscular re-education, 97140 manual therapy, 97035 ultrasound, 97018 paraffin, 60454 fluidotherapy, 97032 electrical stimulation (manual), 97760 Orthotic Initial, [id number] Orthotic/Prosthetic subsequent, functional mobility training, visual/perceptual remediation/compensation, energy conservation, coping strategies training, patient/family education, and DME and/or AE instructions  RECOMMENDED OTHER SERVICES: N/A for this visit  CONSULTED AND AGREED WITH PLAN OF CARE: Patient  PLAN FOR NEXT SESSIONS: monitor BP  Diplopia HEP Sensory precautions Energy Conservation strategies Tremor strategies Coordination HEP Putty vs desensitization strategies Theraband HEP (endurance) [email or web address]  Altamease Asters, OT 09/01/2023, 2:54 PM   For all possible CPT codes, reference the Planned Interventions line above.     Check all conditions that are expected to impact treatment: {Conditions expected to impact treatment:Musculoskeletal disorders and Neurological condition and/or seizures   If treatment provided at initial evaluation, no treatment charged due to lack of authorization.

## 2023-09-01 NOTE — Therapy (Deleted)
 OUTPATIENT OCCUPATIONAL THERAPY NEURO EVALUATION  Patient Name: Aaron [name] MRN: [id number] DOB:[birth year], 38 y.o., male Today's Date: 09/01/2023  PCP: No PCP per pt *** REFERRING PROVIDER: Zelda Hickman, PA-C   END OF SESSION:   Past Medical History:  Diagnosis Date   Atrial fibrillation [facility])    Hyperthyroidism    Mitral regurgitation    NSTEMI (non-ST elevated myocardial infarction) [facility])    Past Surgical History:  Procedure Laterality Date   ECMO CANNULATION N/A 07/10/2023   Procedure: ECMO CANNULATION;  Surgeon: Mardell Shade, MD;  Location: [facility] INVASIVE CV LAB;  Service: Cardiovascular;  Laterality: N/A;   EMBOLECTOMY Left 07/18/2023   Procedure: EMBOLECTOMY Left Femoral, Popliteal and iliac arterys,  Repair Left common femoral artery.;  Surgeon: Kayla Part, MD;  Location: [facility] OR;  Service: Vascular;  Laterality: Left;   INTRAOPERATIVE TRANSESOPHAGEAL ECHOCARDIOGRAM N/A 07/23/2023   Procedure: ECHOCARDIOGRAM, TRANSESOPHAGEAL, INTRAOPERATIVE;  Surgeon: Zelphia Higashi, MD;  Location: [facility] OR;  Service: Open Heart Surgery;  Laterality: N/A;   IR FLUORO GUIDE CV LINE RIGHT  08/08/2023   IR REMOVAL TUN CV CATH W/O FL  08/14/2023   IR US  GUIDE VASC ACCESS RIGHT  08/08/2023   LEFT HEART CATH AND CORONARY ANGIOGRAPHY N/A 10/22/2021   Procedure: LEFT HEART CATH AND CORONARY ANGIOGRAPHY;  Surgeon: Sammy Crisp, MD;  [facility];  Service: Cardiovascular;  Laterality: N/A;   PLACEMENT OF IMPELLA LEFT VENTRICULAR ASSIST DEVICE Right 07/14/2023   Procedure: PLACEMENT OF RIGHT AXILLARY IMPELLA 5.5 LEFT VENTRICULAR ASSIST DEVICE;  Surgeon: Zelphia Higashi, MD;  Location: [facility] OR;  Service: Open Heart Surgery;  Laterality: Right;   REMOVAL OF IMPELLA LEFT VENTRICULAR ASSIST DEVICE Right 07/14/2023   Procedure: REMOVAL OF CP RIGHT FEMORAL IMPELLA LEFT VENTRICULAR ASSIST DEVICE;  Surgeon: Zelphia Higashi, MD;  Location: [facility] OR;  Service: Open Heart Surgery;   Laterality: Right;   REMOVAL OF IMPELLA LEFT VENTRICULAR ASSIST DEVICE N/A 07/23/2023   Procedure: REMOVAL, CARDIAC ASSIST DEVICE, IMPELLA;  Surgeon: Zelphia Higashi, MD;  Location: [facility] OR;  Service: Open Heart Surgery;  Laterality: N/A;   TRANSCATHETER MITRAL EDGE TO EDGE REPAIR N/A 07/16/2023   Procedure: TRANSCATHETER MITRAL EDGE TO EDGE REPAIR;  Surgeon: Arnoldo Lapping, MD;  Location: [facility] INVASIVE CV LAB;  Service: Cardiovascular;  Laterality: N/A;   TRANSESOPHAGEAL ECHOCARDIOGRAM (CATH LAB) N/A 07/16/2023   Procedure: TRANSESOPHAGEAL ECHOCARDIOGRAM;  Surgeon: Arnoldo Lapping, MD;  Location: [facility] INVASIVE CV LAB;  Service: Cardiovascular;  Laterality: N/A;   VENTRICULAR ASSIST DEVICE INSERTION N/A 07/10/2023   Procedure: VENTRICULAR ASSIST DEVICE INSERTION;  Surgeon: Mardell Shade, MD;  [facility];  Service: Cardiovascular;  Laterality: N/A;   Patient Active Problem List   Diagnosis Date Noted   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) 08/11/2023   AKI (acute kidney injury) (HCC) 08/08/2023   Normocytic anemia 08/05/2023   Nausea 08/05/2023   Insomnia 08/05/2023   Atrial fibrillation (HCC) 08/05/2023   Malnutrition of moderate degree 08/05/2023   Debility 08/04/2023   Protein-calorie malnutrition, severe 07/23/2023   Palliative care by specialist 07/21/2023   Cardiac arrest (HCC) 07/10/2023   Cardiogenic shock (HCC) 07/10/2023   Hyperkalemia 07/10/2023   On mechanically assisted ventilation (HCC) 07/10/2023   Acute respiratory failure with hypoxia (HCC) 07/10/2023   Bilateral pulmonary embolism (HCC) 07/09/2023   Chronic heart failure with preserved ejection fraction (HFpEF) (HCC) 07/09/2023   Severe mitral regurgitation 07/09/2023   Paroxysmal  atrial fibrillation (HCC) 07/09/2023   Atrial fibrillation with rapid ventricular response (HCC) 03/20/2023   Chest pain 03/30/2022   Essential hypertension 03/30/2022    Hypomagnesemia 03/30/2022   Hyperthyroidism 10/23/2021   Elevated troponin 10/19/2021   NSTEMI (non-ST elevated myocardial infarction) (HCC) 10/19/2021    ONSET DATE: 08/13/2023 (referral date)  REFERRING DIAG: "CVA"  THERAPY DIAG:  No diagnosis found.  Rationale for Evaluation and Treatment: {HABREHAB:27488}  SUBJECTIVE:   SUBJECTIVE STATEMENT: *** Pt accompanied by: {accompnied:27141}  PERTINENT HISTORY: chronic systolic heart failure, mitral valve disease, CKD, Grave's disease, atrial fibrillation  Per 08/20/23 MD Progress Notes: "Patient with a longstanding history of primary mitral regurgitation with reduced ejection fraction as well as Grave's disease.  He had had progressive heart failure as an outpatient.  He was admitted in February 2025 for progressive shortness of breath.  He was found to have a small pulmonary embolism without significant right heart strain.  He was given fluids, nodal blockade for A-fib with RVR and subsequently became altered, hypoxic, with ultimate cardiac arrest.   ECMO team was consulted and patient was emergently taken to [facility] for ECMO cannulation with Impella CP drainage.  He was upgraded to a 5.5 and given his severe mitral regurgitation was arranged to have MitraClip performed.  Following MitraClip he was able to come off hemodynamic support.  Course complicated by acute renal failure requiring dialysis that slowly improved over the course of his admission.  He went to CIR and was discharged without hemodialysis catheter"  PRECAUTIONS: {Therapy precautions:24002}  WEIGHT BEARING RESTRICTIONS: {Yes ***/No:24003}  PAIN:  Are you having pain? {OPRCPAIN:27236}  FALLS: Has patient fallen in last 6 months? {fallsyesno:27318}  LIVING ENVIRONMENT: Lives with: {OPRC lives with:25569::"lives with their family"} Lives in: {Lives in:25570} Stairs: {opstairs:27293} Has following equipment at home: {Assistive devices:23999}  PLOF:  {PLOF:24004}  PATIENT GOALS: ***  OBJECTIVE:  Note: Objective measures were completed at Evaluation unless otherwise noted.  HAND DOMINANCE: {MISC; OT HAND DOMINANCE:2504427062}  ADLs: Overall ADLs: *** Transfers/ambulation related to ADLs: Eating: *** Grooming: *** UB Dressing: *** LB Dressing: *** Toileting: *** Bathing: *** Tub Shower transfers: *** Equipment: {equipment:25573}  IADLs: Shopping: *** Light housekeeping: *** Meal Prep: *** Community mobility: *** Medication management: *** Financial management: *** Handwriting: {OTWRITTENEXPRESSION:25361}  MOBILITY STATUS: {OTMOBILITY:25360}  POSTURE COMMENTS:  {posture:25561} Sitting balance: {sitting balance:25483}  ACTIVITY TOLERANCE: Activity tolerance: ***  FUNCTIONAL OUTCOME MEASURES: {OTFUNCTIONALMEASURES:27238}  UPPER EXTREMITY ROM:    {AROM/PROM:27142} ROM Right eval Left eval  Shoulder flexion    Shoulder abduction    Shoulder adduction    Shoulder extension    Shoulder internal rotation    Shoulder external rotation    Elbow flexion    Elbow extension    Wrist flexion    Wrist extension    Wrist ulnar deviation    Wrist radial deviation    Wrist pronation    Wrist supination    (Blank rows = not tested)  UPPER EXTREMITY MMT:     MMT Right eval Left eval  Shoulder flexion    Shoulder abduction    Shoulder adduction    Shoulder extension    Shoulder internal rotation    Shoulder external rotation    Middle trapezius    Lower trapezius    Elbow flexion    Elbow extension    Wrist flexion    Wrist extension    Wrist ulnar deviation    Wrist radial deviation    Wrist pronation  Wrist supination    (Blank rows = not tested)  HAND FUNCTION: {handfunction:27230}  COORDINATION: {otcoordination:27237}  SENSATION: {sensation:27233}  EDEMA: ***  MUSCLE TONE: {UETONE:25567}  COGNITION: Overall cognitive status: {cognition:24006}  VISION: Subjective report:  *** Baseline vision: {OTBASELINEVISION:25363} Visual history: {OTVISUALHISTORY:25364}  VISION ASSESSMENT: {visionassessment:27231}  Patient has difficulty with following activities due to following visual impairments: ***  PERCEPTION: {Perception:25564}  PRAXIS: {Praxis:25565}  OBSERVATIONS: ***                                                                                                                             TREATMENT DATE: ***         PATIENT EDUCATION: Education details: *** Person educated: {Person educated:25204} Education method: {Education Method:25205} Education comprehension: {Education Comprehension:25206}  HOME EXERCISE PROGRAM: ***   GOALS: Goals reviewed with patient? {yes/no:20286}  SHORT TERM GOALS: Target date: ***  *** Baseline: Goal status: INITIAL  2.  *** ?tremor reduction strategies? *** Baseline:  Goal status: INITIAL  3.  *** Baseline:  Goal status: INITIAL  4.  *** Baseline:  Goal status: INITIAL  5.  *** Baseline:  Goal status: INITIAL  6.  *** Baseline:  Goal status: INITIAL  LONG TERM GOALS: Target date: ***  *** Baseline:  Goal status: INITIAL  2.  *** Baseline:  Goal status: INITIAL  3.  *** Baseline:  Goal status: INITIAL  4.  *** Baseline:  Goal status: INITIAL  5.  *** Baseline:  Goal status: INITIAL  6.  *** Baseline:  Goal status: INITIAL  ASSESSMENT:  CLINICAL IMPRESSION: Patient is a *** y.o. *** who was seen today for occupational therapy evaluation for ***.   PERFORMANCE DEFICITS: in functional skills including {OT physical skills:25468}, cognitive skills including {OT cognitive skills:25469}, and psychosocial skills including {OT psychosocial skills:25470}.   IMPAIRMENTS: are limiting patient from {OT performance deficits:25471}.   CO-MORBIDITIES: {Comorbidities:25485} that affects occupational performance. Patient will benefit from skilled OT to address above impairments  and improve overall function.  MODIFICATION OR ASSISTANCE TO COMPLETE EVALUATION: {OT modification:25474}  OT OCCUPATIONAL PROFILE AND HISTORY: {OT PROFILE AND HISTORY:25484}  CLINICAL DECISION MAKING: {OT CDM:25475}  REHAB POTENTIAL: {rehabpotential:25112}  EVALUATION COMPLEXITY: {Evaluation complexity:25115}    PLAN:  OT FREQUENCY: {rehab frequency:25116}  OT DURATION: {rehab duration:25117}  PLANNED INTERVENTIONS: {OT Interventions:25467}  RECOMMENDED OTHER SERVICES: ***  CONSULTED AND AGREED WITH PLAN OF CARE: {ZOX:09604}  PLAN FOR NEXT SESSION: ***   Oakley Bellman, OT 09/01/2023, 8:03 AM

## 2023-09-02 ENCOUNTER — Encounter: Payer: Self-pay | Admitting: Physical Therapy

## 2023-09-02 ENCOUNTER — Ambulatory Visit: Admitting: Physical Therapy

## 2023-09-02 VITALS — BP 138/88 | HR 67

## 2023-09-02 DIAGNOSIS — R29898 Other symptoms and signs involving the musculoskeletal system: Secondary | ICD-10-CM | POA: Diagnosis not present

## 2023-09-02 DIAGNOSIS — R208 Other disturbances of skin sensation: Secondary | ICD-10-CM | POA: Diagnosis not present

## 2023-09-02 DIAGNOSIS — R29818 Other symptoms and signs involving the nervous system: Secondary | ICD-10-CM | POA: Diagnosis not present

## 2023-09-02 DIAGNOSIS — R41842 Visuospatial deficit: Secondary | ICD-10-CM | POA: Diagnosis not present

## 2023-09-02 DIAGNOSIS — R251 Tremor, unspecified: Secondary | ICD-10-CM | POA: Diagnosis not present

## 2023-09-02 DIAGNOSIS — R278 Other lack of coordination: Secondary | ICD-10-CM | POA: Diagnosis not present

## 2023-09-02 DIAGNOSIS — R269 Unspecified abnormalities of gait and mobility: Secondary | ICD-10-CM

## 2023-09-02 NOTE — Therapy (Signed)
 OUTPATIENT PHYSICAL THERAPY NEURO EVALUATION   Patient Name: Aaron [name] MRN: [id number] DOB:01-13-1986, 38 y.o., male Today's Date: 09/03/2023   PCP: Patient reports previously receiving info on PCP and has not gotten one at this time REFERRING PROVIDER: Love, Renay Carota, PA-C  END OF SESSION:  PT End of Session - 09/02/23 1404     Visit Number 1    Number of Visits 5    Date for PT Re-Evaluation 10/15/23    Authorization Type [place] Medicaid    PT Start Time 1402    PT Stop Time 1440    PT Time Calculation (min) 38 min    Equipment Utilized During Treatment Gait belt    Activity Tolerance Patient tolerated treatment well    Behavior During Therapy WFL for tasks assessed/performed             Past Medical History:  Diagnosis Date   Atrial fibrillation (HCC)    Hyperthyroidism    Mitral regurgitation    NSTEMI (non-ST elevated myocardial infarction) [facility])    Past Surgical History:  Procedure Laterality Date   ECMO CANNULATION N/A 07/10/2023   Procedure: ECMO CANNULATION;  Surgeon: Mardell Shade, MD;  [facility]: [facility] INVAS[facility] CV LAB;  Service: Cardiovascular;  Laterality: N/A;   EMBOLECTOMY Left 07/18/2023   Procedure: EMBOLECTOMY Left Femoral, Popliteal and iliac arterys,  Repair Left common femoral artery.;  Surgeon: Kayla Part, MD;  Location: [facility] OR;  Service: Vascular;  Laterality: Left;   INTRAOPERATIVE TRANSESOPHAGEAL ECHOCARDIOGRAM N/A 07/23/2023   Procedure: ECHOCARDIOGRAM, TRANSESOPHAGEAL, INTRAOPERATIVE;  Surgeon: Zelphia Higashi, MD;  Location: [facility] OR;  Service: Open Heart Surgery;  Laterality: N/A;   IR FLUORO GUIDE CV LINE RIGHT  08/08/2023   IR REMOVAL TUN CV CATH W/O FL  08/14/2023   IR US  GUIDE VASC ACCESS RIGHT  08/08/2023   LEFT HEART CATH AND CORONARY ANGIOGRAPHY N/A 10/22/2021   Procedure: LEFT HEART CATH AND CORONARY ANGIOGRAPHY;  Surgeon: Sammy Crisp, MD;  [facility] CV LAB;  Service: Cardiovascular;  Laterality: N/A;   PLACEMENT  OF IMPELLA LEFT VENTRICULAR ASSIST DEVICE Right 07/14/2023   Procedure: PLACEMENT OF RIGHT AXILLARY IMPELLA 5.5 LEFT VENTRICULAR ASSIST DEVICE;  Surgeon: Zelphia Higashi, MD;  Location: [facility] OR;  Service: Open Heart Surgery;  Laterality: Right;   REMOVAL OF IMPELLA LEFT VENTRICULAR ASSIST DEVICE Right 07/14/2023   Procedure: REMOVAL OF CP RIGHT FEMORAL IMPELLA LEFT VENTRICULAR ASSIST DEVICE;  Surgeon: Zelphia Higashi, MD;  Location: [facility] OR;  Service: Open Heart Surgery;  Laterality: Right;   REMOVAL OF IMPELLA LEFT VENTRICULAR ASSIST DEVICE N/A 07/23/2023   Procedure: REMOVAL, CARDIAC ASSIST DEVICE, IMPELLA;  Surgeon: Zelphia Higashi, MD;  [facility];  Service: Open Heart Surgery;  Laterality: N/A;   TRANSCATHETER MITRAL EDGE TO EDGE REPAIR N/A 07/16/2023   Procedure: TRANSCATHETER MITRAL EDGE TO EDGE REPAIR;  Surgeon: Arnoldo Lapping, MD;  Location: [facility] INVASIVE CV LAB;  Service: Cardiovascular;  Laterality: N/A;   TRANSESOPHAGEAL ECHOCARDIOGRAM (CATH LAB) N/A 07/16/2023   Procedure: TRANSESOPHAGEAL ECHOCARDIOGRAM;  Surgeon: Arnoldo Lapping, MD;  Location: [facility] INVASIVE CV LAB;  Service: Cardiovascular;  Laterality: N/A;   VENTRICULAR ASSIST DEVICE INSERTION N/A 07/10/2023   Procedure: VENTRICULAR ASSIST DEVICE INSERTION;  Surgeon: Mardell Shade, MD;  [facility] CV [facility];  Service: Cardiovascular;  Laterality: N/A;   Patient Active Problem List   Diagnosis Date Noted   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) 08/11/2023   AKI (  acute kidney injury) (HCC) 08/08/2023   Normocytic anemia 08/05/2023   Nausea 08/05/2023   Insomnia 08/05/2023   Atrial fibrillation (HCC) 08/05/2023   Malnutrition of moderate degree 08/05/2023   Debility 08/04/2023   Protein-calorie malnutrition, severe 07/23/2023   Palliative care by specialist 07/21/2023   Cardiac arrest (HCC) 07/10/2023   Cardiogenic shock (HCC) 07/10/2023   Hyperkalemia 07/10/2023   On  mechanically assisted ventilation (HCC) 07/10/2023   Acute respiratory failure with hypoxia (HCC) 07/10/2023   Bilateral pulmonary embolism (HCC) 07/09/2023   Chronic heart failure with preserved ejection fraction (HFpEF) (HCC) 07/09/2023   Severe mitral regurgitation 07/09/2023   Paroxysmal atrial fibrillation (HCC) 07/09/2023   Atrial fibrillation with rapid ventricular response (HCC) 03/20/2023   Chest pain 03/30/2022   Essential hypertension 03/30/2022   Hypomagnesemia 03/30/2022   Hyperthyroidism 10/23/2021   Elevated troponin 10/19/2021   NSTEMI (non-ST elevated myocardial infarction) (HCC) 10/19/2021    ONSET DATE: 08/13/2023 (referral date)  REFERRING DIAG: I63.9: Cerebral infarction, unspecified   THERAPY DIAG:  Abnormality of gait and mobility - Plan: PT plan of care cert/re-cert  Other lack of coordination - Plan: PT plan of care cert/re-cert  Rationale for Evaluation and Treatment: Rehabilitation  SUBJECTIVE:                                                                                                                                                                                             SUBJECTIVE STATEMENT: Patient goes by "Aaron [name]"  Patient was hospitalized in February for complex conditions related to heart; during hospital stay patient with numerous complications including cardiac arrest, renal failure discharged without hemodialysis, and a stroke. Patient reports that strength has been primary concern as well as hand trembles (is seeing OT). Patient reports that his walking, standing for too long, and reports feeling more sore since having cuts on inside of anterior thigh from lines and lead in hospital. Patient is also sore in his shoulders from where he had different lines/leads in the hospital. Patient is unsure when next doctor's visit - defers to fiance for scheduling. Reports that he does not notice any major weakness in one leg more than the other.   Pt  accompanied by: self and driven by fiance - in the lobby  PERTINENT HISTORY:  infarcts in the right cerebral and cerebellar hemispheres, chronic systolic heart failure, mitral valve disease, CKD, Grave's disease, atrial fibrillation, cardiac arrest during hospital stay in February 2025    PAIN:  Are you having pain? No - Reports none today but does get pain in groin line and anterior hip and shoulders intermittently due to where he  had lines and leads in the hospital  PRECAUTIONS: Fall  RED FLAGS: None   WEIGHT BEARING RESTRICTIONS: No  FALLS: Has patient fallen in last 6 months? No  LIVING ENVIRONMENT: Family/patient expects to be discharged to:: Private residence Living Arrangements: Spouse/significant other Available Help at Discharge: Family;Available 24 hours/day Type of Home: House Home Access: Level entry Home Layout: One level Bathroom Shower/Tub: Engineer, manufacturing systems: Standard Home Equipment: None   PLOF: Independent;Working/employed;Driving; works as a Paediatric nurse   PATIENT GOALS: "Get stronger."   OBJECTIVE:  Note: Objective measures were completed at Evaluation unless otherwise noted.  DIAGNOSTIC FINDINGS:   IMPRESSION MR Brain on 07/24/2023: Small acute infarcts in the right cerebral and cerebellar hemispheres. No generalized finding to implicate global anoxic injury.07/24/2023:   COGNITION: Overall cognitive status: Within functional limits for tasks assessed   SENSATION: Reports intermittent numbness and tingling in hands, fingers, and toes  COORDINATION: Mild dysmetria with supination and pronation in LUE, mild discordination with heel to shin on L LE  EDEMA:  None  MUSCLE TONE: Mild increase tone on LLE  LOWER EXTREMITY ROM:     WFL  LOWER EXTREMITY MMT:    Grossly 4+/5 throughout LE and 5/5 on RLE; however, dysmetric with movements resutling in fluctuations of movement and functional strength more notable on L than R   BED  MOBILITY:  Reports no major problems getting up out of bed at this time  TRANSFERS: Sit to stand: Modified independence  Assistive device utilized: None     Stand to sit: Modified independence  Assistive device utilized: None     Chair to chair: Modified independence  Assistive device utilized: None        STAIRS: Findings: Level of Assistance: Modified independence, Stair Negotiation Technique: Alternating Pattern  with No Rails Single Rail on Right, Number of Stairs: 2 x 4 stairs, Height of Stairs: 6"   , and Comments: no major error in foot placement in today's session, recommend ongoing monitoring given minor coordination deficits noticed with seated testing GAIT: Findings: Gait Characteristics: no major coordination deficits noted during basic overground gait in today's session but recommend close monitoring given seated results, reports increased fatigue levels  and step through pattern, Distance walked: various clinic distances, Assistive device utilized:None, Level of assistance: Modified independence, and Comments: NA  FUNCTIONAL TESTS:     09/02/23 0001  Standardized Balance Assessment  Standardized Balance Assessment Five Times Sit to Stand  Five times sit to stand comments  14.18 seconds without UE use (modI)  Functional Gait  Assessment  Gait assessed  Yes  Gait Level Surface 3 (5 seconds)  Change in Gait Speed 3  Gait with Horizontal Head Turns 3  Gait with Vertical Head Turns 3  Gait and Pivot Turn 3  Step Over Obstacle 3  Gait with Narrow Base of Support 3  Gait with Eyes Closed 3  Ambulating Backwards 3  Steps 3  Total Score 30  FGA comment: 30/30 = low falls risk                                                                                 TREATMENT :    Initial  Eval only  PATIENT EDUCATION: Education details: POC, goal collaboration, examination findings  Person educated: Patient Education method: Explanation Education comprehension: verbalized  understanding and needs further education  HOME EXERCISE PROGRAM: To be provided   GOALS: Goals reviewed with patient? Yes  LONG TERM GOALS: Target date: 10/15/2023 (STG = LTG due to POC length)  Patient will report demonstrate independence with final HEP in order to maintain current gains and continue to progress after physical therapy discharge.   Baseline: To be provided  Goal status: INITIAL  2.  Patient will improve their 5x Sit to Stand score to less than 11  seconds to demonstrate a decreased risk for falls and improved LE strength.   Baseline: 14.18 seconds without UE use Goal status: INITIAL  3.  to be assessed / LTG written  Baseline: To be assessed  Goal status: INITIAL   ASSESSMENT:  CLINICAL IMPRESSION: Patient is a 38 y.o. male who was seen today for physical therapy evaluation and treatment for impairments secondary to a infarcts in the right cerebral and cerebellar hemispheres secondary to complex hospital stay with cardiac arrest with resuscitation, HF, and renal failure. PMH includes uncontrolled Grave's disease which patient reports is currently medically managing. Notably, deficits largely mild as note during PT session with only mild coordination deficits noted L > R in LE with very mild increased tone. Functionally patient moving very well but is off high level baseline playing basketball. Patient reporting concerns with endurance so recommend assessing next session. Patient will benefit from skilled physical therapy services to maximize function to progress towards higher level activities as tolerated by current heart function.   OBJECTIVE IMPAIRMENTS: decreased coordination, decreased endurance, and impaired tone.   ACTIVITY LIMITATIONS: locomotion level  PARTICIPATION LIMITATIONS: community activity and occupation  PERSONAL FACTORS: Age, Past/current experiences, Profession, and 3+ comorbidities: see above  are also affecting patient's functional  outcome.   REHAB POTENTIAL: Good  CLINICAL DECISION MAKING: Stable/uncomplicated  EVALUATION COMPLEXITY: Low  PLAN:  PT FREQUENCY: 1x/week  PT DURATION: 4 weeks  PLANNED INTERVENTIONS: 97164- PT Re-evaluation, 97110-Therapeutic exercises, 97530- Therapeutic activity, 97112- Neuromuscular re-education, 97535- Self Care, 08657- Manual therapy, and 97116- Gait training  PLAN FOR NEXT SESSION: assess and write LTG, work on high level balance and activity tasks, incoperate basketball as appropriate, monitor cardiac function, cardiorespiratory training - walk program?, coordination tasks for LLE   For all possible CPT codes, reference the Planned Interventions line above.     Check all conditions that are expected to impact treatment: {Conditions expected to impact treatment:Respiratory disorders and Neurological condition and/or seizures   If treatment provided at initial evaluation, no treatment charged due to lack of authorization.      Coreen Devoid, PT, DPT 09/03/2023, 11:05 AM

## 2023-09-03 ENCOUNTER — Encounter: Payer: Self-pay | Admitting: Physical Therapy

## 2023-09-09 ENCOUNTER — Ambulatory Visit: Admitting: Physical Therapy

## 2023-09-09 ENCOUNTER — Ambulatory Visit: Admitting: Occupational Therapy

## 2023-09-09 ENCOUNTER — Encounter: Payer: Self-pay | Admitting: Physical Therapy

## 2023-09-09 VITALS — BP 133/89 | HR 76

## 2023-09-09 DIAGNOSIS — R41842 Visuospatial deficit: Secondary | ICD-10-CM | POA: Diagnosis not present

## 2023-09-09 DIAGNOSIS — R29898 Other symptoms and signs involving the musculoskeletal system: Secondary | ICD-10-CM | POA: Diagnosis not present

## 2023-09-09 DIAGNOSIS — R278 Other lack of coordination: Secondary | ICD-10-CM | POA: Diagnosis not present

## 2023-09-09 DIAGNOSIS — R29818 Other symptoms and signs involving the nervous system: Secondary | ICD-10-CM

## 2023-09-09 DIAGNOSIS — R208 Other disturbances of skin sensation: Secondary | ICD-10-CM | POA: Diagnosis not present

## 2023-09-09 DIAGNOSIS — R251 Tremor, unspecified: Secondary | ICD-10-CM

## 2023-09-09 DIAGNOSIS — R269 Unspecified abnormalities of gait and mobility: Secondary | ICD-10-CM | POA: Diagnosis not present

## 2023-09-09 NOTE — Therapy (Unsigned)
 OUTPATIENT PHYSICAL THERAPY NEURO TREATMENT   Patient Name: Aaron [name] MRN: [id number] DOB:01-Dec-1985, 38 y.o., male Today's Date: 09/10/2023   PCP: PT again emphasizes importance of PCP and with patient permission provides contact information  REFERRING PROVIDER: Love, Renay Carota, PA-C  END OF SESSION:  PT End of Session - 09/09/23 1619     Visit Number 2    Number of Visits 5    Date for PT Re-Evaluation 10/15/23    Authorization Type [place] Medicaid    PT Start Time 1420    PT Stop Time 1458    PT Time Calculation (min) 38 min    Equipment Utilized During Treatment Gait belt    Activity Tolerance Patient tolerated treatment well    Behavior During Therapy Flat affect             Past Medical History:  Diagnosis Date   Atrial fibrillation (HCC)    Hyperthyroidism    Mitral regurgitation    NSTEMI (non-ST elevated myocardial infarction) [facility])    Past Surgical History:  Procedure Laterality Date   ECMO CANNULATION N/A 07/10/2023   Procedure: ECMO CANNULATION;  Surgeon: Aaron Shade, MD;  Location: [facility] INVASIVE CV LAB;  Service: Cardiovascular;  Laterality: N/A;   EMBOLECTOMY Left 07/18/2023   Procedure: EMBOLECTOMY Left Femoral, Popliteal and iliac arterys,  Repair Left common femoral artery.;  Surgeon: Aaron Part, MD;  Location: [facility] OR;  Service: Vascular;  Laterality: Left;   INTRAOPERATIVE TRANSESOPHAGEAL ECHOCARDIOGRAM N/A 07/23/2023   Procedure: ECHOCARDIOGRAM, TRANSESOPHAGEAL, INTRAOPERATIVE;  Surgeon: Aaron Higashi, MD;  Location: [facility] OR;  Service: Open Heart Surgery;  Laterality: N/A;   IR FLUORO GUIDE CV LINE RIGHT  08/08/2023   IR REMOVAL TUN CV CATH W/O FL  08/14/2023   IR US  GUIDE VASC ACCESS RIGHT  08/08/2023   LEFT HEART CATH AND CORONARY ANGIOGRAPHY N/A 10/22/2021   Procedure: LEFT HEART CATH AND CORONARY ANGIOGRAPHY;  Surgeon: Aaron Crisp, MD;  Location: [facility] INVASIVE CV LAB;  Service: Cardiovascular;  Laterality: N/A;   PLACEMENT OF IMPELLA  LEFT VENTRICULAR ASSIST DEVICE Right 07/14/2023   Procedure: PLACEMENT OF RIGHT AXILLARY IMPELLA 5.5 LEFT VENTRICULAR ASSIST DEVICE;  Surgeon: Aaron Higashi, MD;  Location: [facility] OR;  Service: Open Heart Surgery;  Laterality: Right;   REMOVAL OF IMPELLA LEFT VENTRICULAR ASSIST DEVICE Right 07/14/2023   Procedure: REMOVAL OF CP RIGHT FEMORAL IMPELLA LEFT VENTRICULAR ASSIST DEVICE;  Surgeon: Aaron Higashi, MD;  Location: [facility] OR;  Service: Open Heart Surgery;  Laterality: Right;   REMOVAL OF IMPELLA LEFT VENTRICULAR ASSIST DEVICE N/A 07/23/2023   Procedure: REMOVAL, CARDIAC ASSIST DEVICE, IMPELLA;  Surgeon: Aaron Higashi, MD;  Location: [facility] OR;  Service: Open Heart Surgery;  Laterality: N/A;   TRANSCATHETER MITRAL EDGE TO EDGE REPAIR N/A 07/16/2023   Procedure: TRANSCATHETER MITRAL EDGE TO EDGE REPAIR;  Surgeon: Aaron Lapping, MD;  Location: [facility] INVASIVE CV LAB;  Service: Cardiovascular;  Laterality: N/A;   TRANSESOPHAGEAL ECHOCARDIOGRAM (CATH LAB) N/A 07/16/2023   Procedure: TRANSESOPHAGEAL ECHOCARDIOGRAM;  Surgeon: Aaron Lapping, MD;  Location: [facility] INVASIVE CV LAB;  Service: Cardiovascular;  Laterality: N/A;   VENTRICULAR ASSIST DEVICE INSERTION N/A 07/10/2023   Procedure: VENTRICULAR ASSIST DEVICE INSERTION;  Surgeon: Aaron Shade, MD;  Location: [facility] INVASIVE CV LAB;  Service: Cardiovascular;  Laterality: N/A;   Patient Active Problem List   Diagnosis Date Noted   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) 08/11/2023   AKI (acute kidney injury) (  HCC) 08/08/2023   Normocytic anemia 08/05/2023   Nausea 08/05/2023   Insomnia 08/05/2023   Atrial fibrillation (HCC) 08/05/2023   Malnutrition of moderate degree 08/05/2023   Debility 08/04/2023   Protein-calorie malnutrition, severe 07/23/2023   Palliative care by specialist 07/21/2023   Cardiac arrest (HCC) 07/10/2023   Cardiogenic shock (HCC) 07/10/2023   Hyperkalemia 07/10/2023   On mechanically  assisted ventilation (HCC) 07/10/2023   Acute respiratory failure with hypoxia (HCC) 07/10/2023   Bilateral pulmonary embolism (HCC) 07/09/2023   Chronic heart failure with preserved ejection fraction (HFpEF) (HCC) 07/09/2023   Severe mitral regurgitation 07/09/2023   Paroxysmal atrial fibrillation (HCC) 07/09/2023   Atrial fibrillation with rapid ventricular response (HCC) 03/20/2023   Chest pain 03/30/2022   Essential hypertension 03/30/2022   Hypomagnesemia 03/30/2022   Hyperthyroidism 10/23/2021   Elevated troponin 10/19/2021   NSTEMI (non-ST elevated myocardial infarction) (HCC) 10/19/2021    ONSET DATE: 08/13/2023 (referral date)  REFERRING DIAG: I63.9: Cerebral infarction, unspecified   THERAPY DIAG:  Abnormality of gait and mobility  Rationale for Evaluation and Treatment: Rehabilitation  SUBJECTIVE:                                                                                                                                                                                             SUBJECTIVE STATEMENT: Patient goes by "Aaron [name]"  Patient reports that he is running low on medications; consistent with OT hand off. Patient open to receiving paper today with contact number to set up PCP appointment. Patient denies falls and near falls. Has scab on back of head from time of stroke, reports removing scab. Denies falls and near falls.   Pt accompanied by: self and driven by fiance - in the lobby  PERTINENT HISTORY:  infarcts in the right cerebral and cerebellar hemispheres, chronic systolic heart failure, mitral valve disease, CKD, Grave's disease, atrial fibrillation, cardiac arrest during hospital stay in February 2025    PAIN:  Are you having pain? No - Reports none today but does get pain in groin line and anterior hip and shoulders intermittently due to where he had lines and leads in the hospital  PRECAUTIONS: Fall  RED FLAGS: None   WEIGHT BEARING RESTRICTIONS:  No  FALLS: Has patient fallen in last 6 months? No  LIVING ENVIRONMENT: Family/patient expects to be discharged to:: Private residence Living Arrangements: Spouse/significant other Available Help at Discharge: Family;Available 24 hours/day Type of Home: House Home Access: Level entry Home Layout: One level Bathroom Shower/Tub: Engineer, manufacturing systems: Standard Home Equipment: None   PLOF: Independent;Working/employed;Driving; works as a Paediatric nurse   PATIENT GOALS: "Get stronger."  OBJECTIVE:  Note: Objective measures were completed at Evaluation unless otherwise noted.  DIAGNOSTIC FINDINGS:   IMPRESSION MR Brain on 07/24/2023: Small acute infarcts in the right cerebral and cerebellar hemispheres. No generalized finding to implicate global anoxic injury. 07/24/2023:  COGNITION: Overall cognitive status: Within functional limits for tasks assessed                                                            TREATMENT :    Self Care:    09/09/2023    4:56 PM 09/09/2023    4:36 PM 09/09/2023    4:26 PM  Vitals with BMI  Systolic 133 149 295  Diastolic 89 97 91  Pulse 76 73 70       Start of session  Following   End of session  Vitals assessed as noted above, seated, PT educates on elevated readings and importance of establishing PCP for long term management PT provides contact information to establish PCP for patient, recommends calling former contact as advised by OT immediately for updated prescriptions and establishing PCP for management   TherAct:   09/09/23 0001  6 minute walk test results   Aerobic Endurance Distance Walked 1202 (feet without AD (modI))  Endurance additional comments RPE: 1-2/10  Discussed results, appropriate pacing and exertion level   Practice for return to basketball Dribbling cross body passing static standing x 20 reps Dribbling with ambulation around gym with cross body passing x 50 feet Lateral shuffle with passing 4 x 15  feet Layup approach with basketball x 2 reps to L and R Held further activity as patient reports some L shoulder soreness, given cardiac history assessed screening questions for heart activity, consistent with heart function post discharge but recommend ongoing close monitoring   PATIENT EDUCATION: Education details: POC, goal collaboration, examination findings  Person educated: Patient Education method: Explanation Education comprehension: verbalized understanding and needs further education  HOME EXERCISE PROGRAM: Seated and static standing basketball dribbling for coordination task   GOALS: Goals reviewed with patient? Yes  LONG TERM GOALS: Target date: 10/15/2023 (STG = LTG due to POC length)  Patient will report demonstrate independence with final HEP in order to maintain current gains and continue to progress after physical therapy discharge.   Baseline: To be provided  Goal status: INITIAL  2.  Patient will improve their 5x Sit to Stand score to less than 11  seconds to demonstrate a decreased risk for falls and improved LE strength.   Baseline: 14.18 seconds without UE use Goal status: INITIAL  3.  to be assessed / LTG written  Baseline: 1202 feet (modI) Goal status: D/C as able to perform at high level with minimal fatigue   ASSESSMENT:  CLINICAL IMPRESSION: Skilled PT session emphasized assessment of patient's cardiorespiratory endurance, return to sport tasks within safe cardiac parameters, and review of medical management safety. Patient would likely benefit from closer cardiac symptom monitoring with return to cardiovascular activity and PT will reach out to cardiologist for cardiac rehab referral. Patient does report feeling mild coordination deficits particularly when dribbling on L and with L layup approach but otherwise performing at very high level. Continue POC as able.  OBJECTIVE IMPAIRMENTS: decreased coordination, decreased endurance, and impaired tone.    ACTIVITY LIMITATIONS: locomotion level  PARTICIPATION LIMITATIONS: community activity and occupation  PERSONAL FACTORS: Age, Past/current experiences, Profession, and 3+ comorbidities: see above  are also affecting patient's functional outcome.   REHAB POTENTIAL: Good  CLINICAL DECISION MAKING: Stable/uncomplicated  EVALUATION COMPLEXITY: Low  PLAN:  PT FREQUENCY: 1x/week  PT DURATION: 4 weeks  PLANNED INTERVENTIONS: 97164- PT Re-evaluation, 97110-Therapeutic exercises, 97530- Therapeutic activity, 97112- Neuromuscular re-education, 97535- Self Care, 40981- Manual therapy, and 97116- Gait training  PLAN FOR NEXT SESSION: work on high level balance and activity tasks, incoperate basketball as appropriate, monitor cardiac function, cardiorespiratory training - walk program?, coordination tasks for LLE   Cardiac rehab follow up?   For all possible CPT codes, reference the Planned Interventions line above.     Check all conditions that are expected to impact treatment: {Conditions expected to impact treatment:Respiratory disorders and Neurological condition and/or seizures   If treatment provided at initial evaluation, no treatment charged due to lack of authorization.      Coreen Devoid, PT, DPT 09/10/2023, 12:33 PM

## 2023-09-09 NOTE — Therapy (Signed)
 OUTPATIENT OCCUPATIONAL THERAPY NEURO TREATMENT  Patient Name: Aaron [name] MRN: [id number] DOB:06-Mar-1986, 38 y.o., male Today's Date: 09/09/2023  PCP: No PCP per pt  REFERRING PROVIDER: Zelda Hickman, PA-C   END OF SESSION:  OT End of Session - 09/09/23 1530     Visit Number 2    Number of Visits 13    Date for OT Re-Evaluation 10/24/23    Authorization Type [place] [employer]    OT Start Time 1531    OT Stop Time 1615    OT Time Calculation (min) 44 min    Activity Tolerance Patient tolerated treatment well    Behavior During Therapy WFL for tasks assessed/performed             Past Medical History:  Diagnosis Date   Atrial fibrillation (HCC)    Hyperthyroidism    Mitral regurgitation    NSTEMI (non-ST elevated myocardial infarction) [facility])    Past Surgical History:  Procedure Laterality Date   ECMO CANNULATION N/A 07/10/2023   Procedure: ECMO CANNULATION;  Surgeon: Mardell Shade, MD;  Location: [facility] INVASIVE CV LAB;  Service: Cardiovascular;  Laterality: N/A;   EMBOLECTOMY Left 07/18/2023   Procedure: EMBOLECTOMY Left Femoral, Popliteal and iliac arterys,  Repair Left common femoral artery.;  Surgeon: Kayla Part, MD;  Location: [facility] OR;  Service: Vascular;  Laterality: Left;   INTRAOPERATIVE TRANSESOPHAGEAL ECHOCARDIOGRAM N/A 07/23/2023   Procedure: ECHOCARDIOGRAM, TRANSESOPHAGEAL, INTRAOPERATIVE;  Surgeon: Zelphia Higashi, MD;  Location: [facility] OR;  Service: Open Heart Surgery;  Laterality: N/A;   IR FLUORO GUIDE CV LINE RIGHT  08/08/2023   IR REMOVAL TUN CV CATH W/O FL  08/14/2023   IR US  GUIDE VASC ACCESS RIGHT  08/08/2023   LEFT HEART CATH AND CORONARY ANGIOGRAPHY N/A 10/22/2021   Procedure: LEFT HEART CATH AND CORONARY ANGIOGRAPHY;  Surgeon: Sammy Crisp, MD;  Location: [facility] INVASIVE CV LAB;  Service: Cardiovascular;  Laterality: N/A;   PLACEMENT OF IMPELLA LEFT VENTRICULAR ASSIST DEVICE Right 07/14/2023   Procedure: PLACEMENT OF RIGHT AXILLARY IMPELLA 5.5 LEFT  VENTRICULAR ASSIST DEVICE;  Surgeon: Zelphia Higashi, MD;  Location: [facility] OR;  Service: Open Heart Surgery;  Laterality: Right;   REMOVAL OF IMPELLA LEFT VENTRICULAR ASSIST DEVICE Right 07/14/2023   Procedure: REMOVAL OF CP RIGHT FEMORAL IMPELLA LEFT VENTRICULAR ASSIST DEVICE;  Surgeon: Zelphia Higashi, MD;  Location: [facility] OR;  Service: Open Heart Surgery;  Laterality: Right;   REMOVAL OF IMPELLA LEFT VENTRICULAR ASSIST DEVICE N/A 07/23/2023   Procedure: REMOVAL, CARDIAC ASSIST DEVICE, IMPELLA;  Surgeon: Zelphia Higashi, MD;  Location: [facility] OR;  Service: Open Heart Surgery;  Laterality: N/A;   TRANSCATHETER MITRAL EDGE TO EDGE REPAIR N/A 07/16/2023   Procedure: TRANSCATHETER MITRAL EDGE TO EDGE REPAIR;  Surgeon: Arnoldo Lapping, MD;  Location: [facility] INVASIVE CV LAB;  Service: Cardiovascular;  Laterality: N/A;   TRANSESOPHAGEAL ECHOCARDIOGRAM (CATH LAB) N/A 07/16/2023   Procedure: TRANSESOPHAGEAL ECHOCARDIOGRAM;  Surgeon: Arnoldo Lapping, MD;  Location: [facility] INVASIVE CV LAB;  Service: Cardiovascular;  Laterality: N/A;   VENTRICULAR ASSIST DEVICE INSERTION N/A 07/10/2023   Procedure: VENTRICULAR ASSIST DEVICE INSERTION;  Surgeon: Mardell Shade, MD;  Location: [facility] INVASIVE CV LAB;  Service: Cardiovascular;  Laterality: N/A;   Patient Active Problem List   Diagnosis Date Noted   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) 08/11/2023   AKI (acute kidney injury) (HCC) 08/08/2023   Normocytic anemia 08/05/2023   Nausea 08/05/2023   Insomnia 08/05/2023  Atrial fibrillation (HCC) 08/05/2023   Malnutrition of moderate degree 08/05/2023   Debility 08/04/2023   Protein-calorie malnutrition, severe 07/23/2023   Palliative care by specialist 07/21/2023   Cardiac arrest (HCC) 07/10/2023   Cardiogenic shock (HCC) 07/10/2023   Hyperkalemia 07/10/2023   On mechanically assisted ventilation (HCC) 07/10/2023   Acute respiratory failure with hypoxia (HCC) 07/10/2023    Bilateral pulmonary embolism (HCC) 07/09/2023   Chronic heart failure with preserved ejection fraction (HFpEF) (HCC) 07/09/2023   Severe mitral regurgitation 07/09/2023   Paroxysmal atrial fibrillation (HCC) 07/09/2023   Atrial fibrillation with rapid ventricular response (HCC) 03/20/2023   Chest pain 03/30/2022   Essential hypertension 03/30/2022   Hypomagnesemia 03/30/2022   Hyperthyroidism 10/23/2021   Elevated troponin 10/19/2021   NSTEMI (non-ST elevated myocardial infarction) (HCC) 10/19/2021    ONSET DATE: 08/13/2023 (referral date)  REFERRING DIAG: I63.9: Cerebral infarction, unspecified   THERAPY DIAG:  No diagnosis found.  Rationale for Evaluation and Treatment: Rehabilitation  SUBJECTIVE:   SUBJECTIVE STATEMENT:  Not having double vision in the past week he reported the 2 recent episodes where his vision got bad ie) couldn't see for a little bit, when vision came back then saw double for  few days but this has resolved.  He also reports his tremors are getting better also.   Pt accompanied by: self   PERTINENT HISTORY: chronic systolic heart failure, mitral valve disease, CKD, Grave's disease, atrial fibrillation  Per 08/20/23 MD Progress Notes: "Patient with a longstanding history of primary mitral regurgitation with reduced ejection fraction as well as Grave's disease.  He had had progressive heart failure as an outpatient.  He was admitted in February 2025 for progressive shortness of breath.  He was found to have a small pulmonary embolism without significant right heart strain.  He was given fluids, nodal blockade for A-fib with RVR and subsequently became altered, hypoxic, with ultimate cardiac arrest.   ECMO team was consulted and patient was emergently taken to [facility] for ECMO cannulation with Impella CP drainage.  He was upgraded to a 5.5 and given his severe mitral regurgitation was arranged to have MitraClip performed.  Following MitraClip he was able to come  off hemodynamic support.  Course complicated by acute renal failure requiring dialysis that slowly improved over the course of his admission.  He went to CIR and was discharged without hemodialysis catheter"  PRECAUTIONS: Fall  WEIGHT BEARING RESTRICTIONS: No  PAIN:  Are you having pain? Yes: NPRS scale: 4/10 Pain location: incisions Pain description: sore Aggravating factors: unknown Relieving factors: rest  FALLS: Has patient fallen in last 6 months? No  LIVING ENVIRONMENT: Family/patient expects to be discharged to:: Private residence Living Arrangements: Spouse/significant other Available Help at Discharge: Family;Available 24 hours/day Type of Home: House Home Access: Level entry Home Layout: One level Bathroom Shower/Tub: Engineer, manufacturing systems: Standard Home Equipment: None  PLOF: Independent;Working/employed;Driving; works as a Paediatric nurse   PATIENT GOALS: return to work/PLOF  OBJECTIVE:  Note: Objective measures were completed at Evaluation unless otherwise noted.  HAND DOMINANCE: Right  ADLs: Overall ADLs: mod I  IADLs: Shopping: mod A Light housekeeping: mod I Meal Prep: mod I Community mobility: dependent Medication management: mod I Financial management: mod I Handwriting: 90% legible; has not tried typing  MOBILITY STATUS: Independent  ACTIVITY TOLERANCE: Activity tolerance: fair  FUNCTIONAL OUTCOME MEASURES: PSFS: 5.7 total score  Total score = sum of the activity scores/number of activities Minimum detectable change (90%CI) for average score = 2 points  Minimum detectable change (90%CI) for single activity score = 3 points   UPPER EXTREMITY ROM and MMT:    BUE WNL though poor endurance  HAND FUNCTION: Grip strength: Right: 113.7 lbs; Left: 116.8 lbs  COORDINATION: 9 Hole Peg test: Right: 25 sec; Left: 35 sec  SENSATION: Paresthesias reported B  EDEMA: none reported or observed  MUSCLE TONE:BUE WFL  COGNITION: Overall  cognitive status: Within functional limits for tasks assessed  VISION: Subjective report: some recent episodes of blurry/diplopia that has resolved Baseline vision: No visual deficits Visual history:  none  VISION ASSESSMENT: WFL  PERCEPTION: WFL  PRAXIS: WFL  OBSERVATIONS: Mild B tremor. Appears well-kept. No AD for ambulation, no LOB.                                                                                                                            TREATMENT:    Coordination Exercise/Activity handout with images provided for various activities to work on B UE finger ROM, dexterity and coordination with attention to tremors.  Demonstration practice and modifications suggested to improve technique, motor control and ease of performing task.  Tasks included:  Pick up coins, dice and objects of of different sizes ... To place in containers To stack - with guidance to work on include/isolate specific fingers. To pick up items one at a time until patient got 5+ in their hand and then move item from palm to fingertips to release ie) Finger-to-palm then palm-to-finger translation of small items - Options to vary difficulty include using a washcloth under items like coins or using larger items (checkers vs coins or blocks/dominos vs dice) for increased ease of picking up items.  Shuffling, Flipping and dealing cards 1 at a time. -- Setup patient to work on sorting cards, focusing on using index finger with thumb to flip cards or holding deck of cards in palm of hand and push off 1 card at a time from the top of the deck using only thumb. OT educated pt on table top play of Solitaire for BUE ROM, coordination, visual processing, scanning, and sequencing. Pt required initial demonstration/instruction for rules but caught on quick and performed task well.     Rotate golf balls (clockwise and counter-clockwise) with forearm pronated and balls on table or supinated and balls in hand.   Twirl  pen/cil between fingers. - Encouragement to isolate fingers individually and "twirl" (rotation) or flipping and shift up and down the pen (translation) to get it in position for writing or erasing.    Tear a piece of paper towel and roll it into small balls with fingertips ie) straw wrapping when eating out.    Patient is encouraged to take breaks, relax arm/shoulder by supporting forearm, minimize compensatory motions and a try different activities throughout the day/week including games like Gillermina Lacer (for the dice), card games, Connect 4 etc.   Patient benefited from extra time, verbal/tactile cues, and modeling of task to allow time  for processing of verbal instructions and improve motor planning of unfamiliar movements.   Began introduction to tremor compensatory strategies but session ended before education was completed.  PATIENT EDUCATION: Education details: Psychiatrist, Intro to tremor compensation Person educated: Patient Education method: Explanation, Demonstration, Tactile cues, Verbal cues, and Handouts Education comprehension: verbalized understanding, returned demonstration, verbal cues required, tactile cues required, and needs further education  HOME EXERCISE PROGRAM: 09/09/23: Coordination Activities  GOALS:  SHORT TERM GOALS: Target date: 09/29/2023  Patient will demonstrate initial BUE HEP with 25% verbal cues or less for proper execution. Baseline: Goal status: IN Progress  2.  Patient will return demonstration with use of DME as needed for tremor reduction.  Baseline:  Goal status: IN Progress  3.  Patient will independently verbalize at least 3 energy conservation principles in relation to ADLs to increase functional independence. Baseline:  Goal status: INITIAL  4.  Patient will independently recall at least 2 vision strategies without cueing. Baseline:  Goal status: INITIAL  5.  Pt will independently recall the 5 main sensory precautions (cold,  heat, sharp, chemical, and heavy) as needed to prevent injury/harm secondary to impairments.    LONG TERM GOALS: Target date: 10/24/2023   Patient will demonstrate updated BUE HEP with visual handouts only for proper execution. Baseline:  Goal status: INITIAL  2.  Patient will report at least two-point increase in average PSFS score or at least three-point increase in a single activity score indicating functionally significant improvement given minimum detectable change.  Baseline: 5.7 total score (See above for individual activity scores) Baseline:  Goal status: INITIAL  ASSESSMENT:  CLINICAL IMPRESSION: Patient is a 38 y.o. male who was seen today for occupational therapy evaluation for CVA. Patient responded well to HEP ideas for coordination today. Pt will benefit from further skilled OT services in the outpatient setting to work on impairments as noted below to help pt return to PLOF as able.   PERFORMANCE DEFICITS: in functional skills including ADLs, IADLs, coordination, dexterity, Fine motor control, endurance, decreased knowledge of precautions, decreased knowledge of use of DME, vision, and UE functional use.   IMPAIRMENTS: are limiting patient from ADLs, IADLs, rest and sleep, work, leisure, and social participation.   CO-MORBIDITIES: may have co-morbidities  that affects occupational performance. Patient will benefit from skilled OT to address above impairments and improve overall function.  REHAB POTENTIAL: Good  PLAN:  OT FREQUENCY: 2x/week  OT DURATION: 6 weeks  PLANNED INTERVENTIONS: 97168 OT Re-evaluation, 97535 self care/ADL training, 16109 therapeutic exercise, 97530 therapeutic activity, 97112 neuromuscular re-education, 97140 manual therapy, 97035 ultrasound, 97018 paraffin, 60454 fluidotherapy, 97032 electrical stimulation (manual), 97760 Orthotic Initial, [id number] Orthotic/Prosthetic subsequent, functional mobility training, visual/perceptual  remediation/compensation, energy conservation, coping strategies training, patient/family education, and DME and/or AE instructions  RECOMMENDED OTHER SERVICES: N/A for this visit  CONSULTED AND AGREED WITH PLAN OF CARE: Patient  PLAN FOR NEXT SESSIONS: monitor BP  Diplopia HEP Sensory precautions Energy Conservation strategies Tremor strategies (intro 4/29) Coordination HEP (handout 4/29) Putty vs desensitization strategies Theraband HEP (endurance) [email or web address]  Zora Hires, OT 09/09/2023, 3:31 PM

## 2023-09-10 ENCOUNTER — Telehealth: Payer: Self-pay | Admitting: Physical Therapy

## 2023-09-10 ENCOUNTER — Encounter: Payer: Self-pay | Admitting: Physical Therapy

## 2023-09-10 NOTE — Telephone Encounter (Signed)
 Dr. Alease Amend,  Aaron [name]  was evaluated by PT on 09/10/2023.  The patient would benefit from cardiac rehab evaluation for ongoing cardiac symptoms. CVA symptoms very mild to the point for PT are not major concern but patient hoping to return to basketball. With cardio activity based on what we are seeing in this setting would prefer to have him closer monitored with cardiac exercise as is able to be done in cardiac rehab.     If you agree, please place an referral.  Thank you, Camella Cave, PT, DPT  [facility] [address] [place], [place]  [address] Phone:  [phone] Fax:  [phone]

## 2023-09-10 NOTE — Telephone Encounter (Signed)
 In addition to the cardiac rehab referral, I wanted to ask your opinion about the feasibility of return to recreation sport for this patient. Patient prior to MI and CHF was regularly playing basketball with friends. Do you have thoughts about modified return to sport? This is part of reason we were advising cardiac rehab for better monitoring patient cardiorespiratory response during cardio.   Thank you, Camella Cave, PT, DPT

## 2023-09-16 ENCOUNTER — Ambulatory Visit: Admitting: Occupational Therapy

## 2023-09-16 ENCOUNTER — Ambulatory Visit: Attending: Physical Medicine and Rehabilitation | Admitting: Physical Therapy

## 2023-09-16 ENCOUNTER — Encounter: Payer: Self-pay | Admitting: Physical Therapy

## 2023-09-16 VITALS — BP 166/83 | HR 68

## 2023-09-16 DIAGNOSIS — R278 Other lack of coordination: Secondary | ICD-10-CM | POA: Insufficient documentation

## 2023-09-16 DIAGNOSIS — R29818 Other symptoms and signs involving the nervous system: Secondary | ICD-10-CM | POA: Insufficient documentation

## 2023-09-16 DIAGNOSIS — R29898 Other symptoms and signs involving the musculoskeletal system: Secondary | ICD-10-CM | POA: Insufficient documentation

## 2023-09-16 DIAGNOSIS — R251 Tremor, unspecified: Secondary | ICD-10-CM

## 2023-09-16 DIAGNOSIS — R269 Unspecified abnormalities of gait and mobility: Secondary | ICD-10-CM | POA: Insufficient documentation

## 2023-09-16 DIAGNOSIS — R208 Other disturbances of skin sensation: Secondary | ICD-10-CM | POA: Insufficient documentation

## 2023-09-16 DIAGNOSIS — R41842 Visuospatial deficit: Secondary | ICD-10-CM | POA: Insufficient documentation

## 2023-09-16 NOTE — Therapy (Unsigned)
 OUTPATIENT OCCUPATIONAL THERAPY NEURO TREATMENT  Patient Name: Aaron [name] MRN: [id number] DOB:[birth year], 38 y.o., male Today's Date: 09/16/2023  PCP: No PCP per pt  REFERRING PROVIDER: Zelda Hickman, PA-C   END OF SESSION:  OT End of Session - 09/16/23 1532     Visit Number 3    Number of Visits 13    Date for OT Re-Evaluation 10/24/23    Authorization Type [place] [employer]    OT Start Time 1532    OT Stop Time 1616    OT Time Calculation (min) 44 min    Equipment Utilized During Treatment Blue putty, cups    Activity Tolerance Patient tolerated treatment well    Behavior During Therapy WFL for tasks assessed/performed             Past Medical History:  Diagnosis Date   Atrial fibrillation (HCC)    Hyperthyroidism    Mitral regurgitation    NSTEMI (non-ST elevated myocardial infarction) [facility])    Past Surgical History:  Procedure Laterality Date   ECMO CANNULATION N/A 07/10/2023   Procedure: ECMO CANNULATION;  Surgeon: Mardell Shade, MD;  Location: [facility] INVASIVE CV LAB;  Service: Cardiovascular;  Laterality: N/A;   EMBOLECTOMY Left 07/18/2023   Procedure: EMBOLECTOMY Left Femoral, Popliteal and iliac arterys,  Repair Left common femoral artery.;  Surgeon: Kayla Part, MD;  Location: [facility] OR;  Service: Vascular;  Laterality: Left;   INTRAOPERATIVE TRANSESOPHAGEAL ECHOCARDIOGRAM N/A 07/23/2023   Procedure: ECHOCARDIOGRAM, TRANSESOPHAGEAL, INTRAOPERATIVE;  Surgeon: Zelphia Higashi, MD;  Location: [facility] OR;  Service: Open Heart Surgery;  Laterality: N/A;   IR FLUORO GUIDE CV LINE RIGHT  08/08/2023   IR REMOVAL TUN CV CATH W/O FL  08/14/2023   IR US  GUIDE VASC ACCESS RIGHT  08/08/2023   LEFT HEART CATH AND CORONARY ANGIOGRAPHY N/A 10/22/2021   Procedure: LEFT HEART CATH AND CORONARY ANGIOGRAPHY;  Surgeon: Sammy Crisp, MD;  Location: [facility] INVASIVE CV LAB;  Service: Cardiovascular;  Laterality: N/A;   PLACEMENT OF IMPELLA LEFT VENTRICULAR ASSIST DEVICE Right 07/14/2023    Procedure: PLACEMENT OF RIGHT AXILLARY IMPELLA 5.5 LEFT VENTRICULAR ASSIST DEVICE;  Surgeon: Zelphia Higashi, MD;  Location: [facility] OR;  Service: Open Heart Surgery;  Laterality: Right;   REMOVAL OF IMPELLA LEFT VENTRICULAR ASSIST DEVICE Right 07/14/2023   Procedure: REMOVAL OF CP RIGHT FEMORAL IMPELLA LEFT VENTRICULAR ASSIST DEVICE;  Surgeon: Zelphia Higashi, MD;  Location: [facility] OR;  Service: Open Heart Surgery;  Laterality: Right;   REMOVAL OF IMPELLA LEFT VENTRICULAR ASSIST DEVICE N/A 07/23/2023   Procedure: REMOVAL, CARDIAC ASSIST DEVICE, IMPELLA;  Surgeon: Zelphia Higashi, MD;  Location: [facility] OR;  Service: Open Heart Surgery;  Laterality: N/A;   TRANSCATHETER MITRAL EDGE TO EDGE REPAIR N/A 07/16/2023   Procedure: TRANSCATHETER MITRAL EDGE TO EDGE REPAIR;  Surgeon: Arnoldo Lapping, MD;  Location: [facility] INVASIVE CV LAB;  Service: Cardiovascular;  Laterality: N/A;   TRANSESOPHAGEAL ECHOCARDIOGRAM (CATH LAB) N/A 07/16/2023   Procedure: TRANSESOPHAGEAL ECHOCARDIOGRAM;  Surgeon: Arnoldo Lapping, MD;  Location: [facility] INVASIVE CV LAB;  Service: Cardiovascular;  Laterality: N/A;   VENTRICULAR ASSIST DEVICE INSERTION N/A 07/10/2023   Procedure: VENTRICULAR ASSIST DEVICE INSERTION;  Surgeon: Mardell Shade, MD;  Location: [facility] INVASIVE CV LAB;  Service: Cardiovascular;  Laterality: N/A;   Patient Active Problem List   Diagnosis Date Noted   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) 08/11/2023   AKI (acute kidney injury) (HCC) 08/08/2023   Normocytic anemia 08/05/2023  Nausea 08/05/2023   Insomnia 08/05/2023   Atrial fibrillation (HCC) 08/05/2023   Malnutrition of moderate degree 08/05/2023   Debility 08/04/2023   Protein-calorie malnutrition, severe 07/23/2023   Palliative care by specialist 07/21/2023   Cardiac arrest (HCC) 07/10/2023   Cardiogenic shock (HCC) 07/10/2023   Hyperkalemia 07/10/2023   On mechanically assisted ventilation (HCC) 07/10/2023   Acute  respiratory failure with hypoxia (HCC) 07/10/2023   Bilateral pulmonary embolism (HCC) 07/09/2023   Chronic heart failure with preserved ejection fraction (HFpEF) (HCC) 07/09/2023   Severe mitral regurgitation 07/09/2023   Paroxysmal atrial fibrillation (HCC) 07/09/2023   Atrial fibrillation with rapid ventricular response (HCC) 03/20/2023   Chest pain 03/30/2022   Essential hypertension 03/30/2022   Hypomagnesemia 03/30/2022   Hyperthyroidism 10/23/2021   Elevated troponin 10/19/2021   NSTEMI (non-ST elevated myocardial infarction) (HCC) 10/19/2021    ONSET DATE: 08/13/2023 (referral date)  REFERRING DIAG: I63.9: Cerebral infarction, unspecified   THERAPY DIAG:  Other lack of coordination  Tremor  Other symptoms and signs involving the nervous system  Other disturbances of skin sensation  Rationale for Evaluation and Treatment: Rehabilitation  SUBJECTIVE:   SUBJECTIVE STATEMENT:  Pt reports a recent change in medication but not noted in med list.  Pt reports he thinks they discharged oxycodone  and added a couple of meds but is not "exactly sure" therefore was encouraged to bring his meds/list next time.   Re: vision changes, pt reported he may have blurry vision for a couple of moments that resolve but does feel like he is seeing things in the periphery sometimes but there is nothing there.    He also reports his tremors are getting better also.   Pt accompanied by: self   PERTINENT HISTORY: chronic systolic heart failure, mitral valve disease, CKD, Grave's disease, atrial fibrillation  Per 08/20/23 MD Progress Notes: "Patient with a longstanding history of primary mitral regurgitation with reduced ejection fraction as well as Grave's disease.  He had had progressive heart failure as an outpatient.  He was admitted in February 2025 for progressive shortness of breath.  He was found to have a small pulmonary embolism without significant right heart strain.  He was given  fluids, nodal blockade for A-fib with RVR and subsequently became altered, hypoxic, with ultimate cardiac arrest.   ECMO team was consulted and patient was emergently taken to [facility] for ECMO cannulation with Impella CP drainage.  He was upgraded to a 5.5 and given his severe mitral regurgitation was arranged to have MitraClip performed.  Following MitraClip he was able to come off hemodynamic support.  Course complicated by acute renal failure requiring dialysis that slowly improved over the course of his admission.  He went to CIR and was discharged without hemodialysis catheter"  PRECAUTIONS: Fall  WEIGHT BEARING RESTRICTIONS: No  PAIN: Spasms in leg and where he has incisions - "discomfort" - both groins, both sides of neck and in back behind his heart and up to his neck.  Are you having pain? Yes: NPRS scale: 4/10 Pain location: incisions Pain description: sore Aggravating factors: unknown Relieving factors: rest  FALLS: Has patient fallen in last 6 months? No  LIVING ENVIRONMENT: Family/patient expects to be discharged to:: Private residence Living Arrangements: Spouse/significant other Available Help at Discharge: Family;Available 24 hours/day Type of Home: House Home Access: Level entry Home Layout: One level Bathroom Shower/Tub: Engineer, manufacturing systems: Standard Home Equipment: None  PLOF: Independent;Working/employed;Driving; works as a Paediatric nurse   PATIENT GOALS: return to work/PLOF  OBJECTIVE:  Note: Objective measures were completed at Evaluation unless otherwise noted.  HAND DOMINANCE: Right  ADLs: Overall ADLs: mod I  IADLs: Shopping: mod A Light housekeeping: mod I Meal Prep: mod I Community mobility: dependent Medication management: mod I Financial management: mod I Handwriting: 90% legible; has not tried typing  MOBILITY STATUS: Independent  ACTIVITY TOLERANCE: Activity tolerance: fair  FUNCTIONAL OUTCOME MEASURES: PSFS: 5.7 total  score  Total score = sum of the activity scores/number of activities Minimum detectable change (90%CI) for average score = 2 points Minimum detectable change (90%CI) for single activity score = 3 points   UPPER EXTREMITY ROM and MMT:    BUE WNL though poor endurance  HAND FUNCTION: Grip strength: Right: 113.7 lbs; Left: 116.8 lbs  COORDINATION: 9 Hole Peg test: Right: 25 sec; Left: 35 sec  SENSATION: Paresthesias reported B  EDEMA: none reported or observed  MUSCLE TONE:BUE WFL  COGNITION: Overall cognitive status: Within functional limits for tasks assessed  VISION: Subjective report: some recent episodes of blurry/diplopia that has resolved Baseline vision: No visual deficits Visual history: none  VISION ASSESSMENT: WFL  PERCEPTION: WFL  PRAXIS: WFL  OBSERVATIONS: Mild B tremor. Appears well-kept. No AD for ambulation, no LOB.                                                                                                                            TODAY'S TREATMENT:   Manual Techniques:   Tactile assessment reveals moderate muscular tension in the upper trapezius/neck with slight increased tone and probable adhesions. Pt agreeable to trial of myofascial cupping with emulsifying lotion conducted in private therapy office with t-shirt removed.  Medium cup applied to upper shoulder region with OTR using gliding and static techniques to promote tissue release and increase local circulation. No adverse reaction observed during or after treatment.  The patient tolerated the procedure well, and the treatment focused on breaking up fascial adhesions, improving soft tissue mobility, and enhancing circulation to the affected areas, to promote improved AROM through increased mobility of BUEs/shoulders.  Pt reported improvement in flexibility and ROM of BUEs as well as comfort with ROM.    Therapeutic Activities: Initiated Putty Exercises with blue putty to begin  strengthening, coordination and sensory stimulation of B UEs.  Patient provided visual demonstration, verbal and tactile cues as needed to improve performance of the various exercises/activities including:   - Putty Squeezes - cues to squeeze putty into log for use with other exercises and to fold putty in half with 1 hand  - Putty Rolls - encourage to roll putty into logs with sensory stimulation to entire length of hand, fingers and wrist as needed   - Pinch and Pull with Putty - this motion is combined with different pinches (3-Point Pinch, Tip Pinch) - patient encouraged to combine tripod, pincer and/or key pinch with "pinch and pull" motion of putty pulling away from midline, changing between different pinches and changing different directions  to change grip  - Removing Objects from Putty  - encouraged to hide items (coins, marble, dice etc) and use one hand at a time to find the objects and identify them by tactile input before s/he digs them out and can see them visually.  OT educated patient on theraputty recommendations: avoid hot environments, place in designated container, avoid contact with fabrics. Patient verbalized understanding.    Patient benefited from extra time, verbal/tactile cues, and modeling of task to allow time for processing of verbal instructions and improve motor planning of unfamiliar movements.   PATIENT EDUCATION: Education details: Putty Activities Person educated: Patient Education method: Explanation, Demonstration, Tactile cues, Verbal cues, and Handouts Education comprehension: verbalized understanding, returned demonstration, verbal cues required, tactile cues required, and needs further education  HOME EXERCISE PROGRAM: 09/09/23: Coordination Activities 09/16/23: Putty Activities Access Code: [email or web address]  GOALS:  SHORT TERM GOALS: Target date: 09/29/2023  Patient will demonstrate initial BUE HEP with 25% verbal cues or less for proper execution. Baseline:  New to outpt OT  Goal status: IN Progress  2.  Patient will return demonstration with use of DME as needed for tremor reduction.  Baseline: New to outpt OT  Goal status: IN Progress  3.  Patient will independently verbalize at least 3 energy conservation principles in relation to ADLs to increase functional independence. Baseline: New to outpt OT  Goal status: INITIAL  4.  Patient will independently recall at least 2 vision strategies without cueing. Baseline: New to outpt OT  Goal status: INITIAL  5.  Pt will independently recall the 5 main sensory precautions (cold, heat, sharp, chemical, and heavy) as needed to prevent injury/harm secondary to impairments.  Baseline: New to outpt OT Goal Status: INITIAL   LONG TERM GOALS: Target date: 10/24/2023   Patient will demonstrate updated BUE HEP with visual handouts only for proper execution. Baseline: New to outpt OT  Goal status: INITIAL  2.  Patient will report at least two-point increase in average PSFS score or at least three-point increase in a single activity score indicating functionally significant improvement given minimum detectable change.  Baseline: 5.7 total score (See above for individual activity scores) Goal status: INITIAL  ASSESSMENT:  CLINICAL IMPRESSION: Patient is a 38 y.o. male who was seen today for occupational therapy evaluation for CVA. Patient responded well to HEP ideas for putty activities today and trial of manual technique of cupping to help with shoulder tension and discomfort. Pt will benefit from further skilled OT services in the outpatient setting to work on impairments as noted below to help pt return to PLOF as able.   PERFORMANCE DEFICITS: in functional skills including ADLs, IADLs, coordination, dexterity, Fine motor control, endurance, decreased knowledge of precautions, decreased knowledge of use of DME, vision, and UE functional use.   IMPAIRMENTS: are limiting patient from ADLs, IADLs, rest  and sleep, work, leisure, and social participation.   CO-MORBIDITIES: may have co-morbidities  that affects occupational performance. Patient will benefit from skilled OT to address above impairments and improve overall function.  REHAB POTENTIAL: Good  PLAN:  OT FREQUENCY: 2x/week  OT DURATION: 6 weeks  PLANNED INTERVENTIONS: 97168 OT Re-evaluation, 97535 self care/ADL training, 56213 therapeutic exercise, 97530 therapeutic activity, 97112 neuromuscular re-education, 97140 manual therapy, 97035 ultrasound, 97018 paraffin, 08657 fluidotherapy, 97032 electrical stimulation (manual), [id number] Orthotic Initial, [id number] Orthotic/Prosthetic subsequent, functional mobility training, visual/perceptual remediation/compensation, energy conservation, coping strategies training, patient/family education, and DME and/or AE instructions  RECOMMENDED OTHER SERVICES: N/A for this visit  CONSULTED AND AGREED WITH PLAN OF CARE: Patient  PLAN FOR NEXT SESSIONS: monitor BP  Diplopia HEP Sensory precautions Energy Conservation strategies Tremor strategies (intro 4/29) Coordination HEP (handout 4/29) Putty (5/6) vs desensitization strategies Theraband HEP (endurance) [email or web address] Manual/cupping trialled  Zora Hires, OT 09/16/2023, 4:42 PM

## 2023-09-16 NOTE — Therapy (Unsigned)
 OUTPATIENT PHYSICAL THERAPY NEURO TREATMENT   Patient Name: Aaron [name] MRN: [id number] DOB:Aug 19, 1985, 38 y.o., male Today's Date: 09/17/2023   PCP: PT again emphasizes importance of PCP and with patient permission provides contact information  REFERRING PROVIDER: Love, Renay Carota, PA-C  END OF SESSION:  PT End of Session - 09/16/23 1616     Visit Number 3    Number of Visits 5    Date for PT Re-Evaluation 10/15/23    Authorization Type [place] Medicaid    PT Start Time 1616    PT Stop Time 1701    PT Time Calculation (min) 45 min    Equipment Utilized During Treatment Gait belt    Activity Tolerance Patient tolerated treatment well    Behavior During Therapy WFL for tasks assessed/performed             Past Medical History:  Diagnosis Date   Atrial fibrillation (HCC)    Hyperthyroidism    Mitral regurgitation    NSTEMI (non-ST elevated myocardial infarction) [facility])    Past Surgical History:  Procedure Laterality Date   ECMO CANNULATION N/A 07/10/2023   Procedure: ECMO CANNULATION;  Surgeon: Mardell Shade, MD;  Location: [facility] INVASIVE CV LAB;  Service: Cardiovascular;  Laterality: N/A;   EMBOLECTOMY Left 07/18/2023   Procedure: EMBOLECTOMY Left Femoral, Popliteal and iliac arterys,  Repair Left common femoral artery.;  Surgeon: Kayla Part, MD;  Location: [facility];  Service: Vascular;  Laterality: Left;   INTRAOPERATIVE TRANSESOPHAGEAL ECHOCARDIOGRAM N/A 07/23/2023   Procedure: ECHOCARDIOGRAM, TRANSESOPHAGEAL, INTRAOPERATIVE;  Surgeon: Zelphia Higashi, MD;  Location: [facility] OR;  Service: Open Heart Surgery;  Laterality: N/A;   IR FLUORO GUIDE CV LINE RIGHT  08/08/2023   IR REMOVAL TUN CV CATH W/O FL  08/14/2023   IR US  GUIDE VASC ACCESS RIGHT  08/08/2023   LEFT HEART CATH AND CORONARY ANGIOGRAPHY N/A 10/22/2021   Procedure: LEFT HEART CATH AND CORONARY ANGIOGRAPHY;  Surgeon: Sammy Crisp, MD;  Location: [facility] INVASIVE CV LAB;  Service: Cardiovascular;  Laterality: N/A;    PLACEMENT OF IMPELLA LEFT VENTRICULAR ASSIST DEVICE Right 07/14/2023   Procedure: PLACEMENT OF RIGHT AXILLARY IMPELLA 5.5 LEFT VENTRICULAR ASSIST DEVICE;  Surgeon: Zelphia Higashi, MD;  Location: [facility] OR;  Service: Open Heart Surgery;  Laterality: Right;   REMOVAL OF IMPELLA LEFT VENTRICULAR ASSIST DEVICE Right 07/14/2023   Procedure: REMOVAL OF CP RIGHT FEMORAL IMPELLA LEFT VENTRICULAR ASSIST DEVICE;  Surgeon: Zelphia Higashi, MD;  Location: [facility] OR;  Service: Open Heart Surgery;  Laterality: Right;   REMOVAL OF IMPELLA LEFT VENTRICULAR ASSIST DEVICE N/A 07/23/2023   Procedure: REMOVAL, CARDIAC ASSIST DEVICE, IMPELLA;  Surgeon: Zelphia Higashi, MD;  Location: [facility] OR;  Service: Open Heart Surgery;  Laterality: N/A;   TRANSCATHETER MITRAL EDGE TO EDGE REPAIR N/A 07/16/2023   Procedure: TRANSCATHETER MITRAL EDGE TO EDGE REPAIR;  Surgeon: Arnoldo Lapping, MD;  Location: [facility] INVASIVE CV LAB;  Service: Cardiovascular;  Laterality: N/A;   TRANSESOPHAGEAL ECHOCARDIOGRAM (CATH LAB) N/A 07/16/2023   Procedure: TRANSESOPHAGEAL ECHOCARDIOGRAM;  Surgeon: Arnoldo Lapping, MD;  Location: [facility] INVASIVE CV LAB;  Service: Cardiovascular;  Laterality: N/A;   VENTRICULAR ASSIST DEVICE INSERTION N/A 07/10/2023   Procedure: VENTRICULAR ASSIST DEVICE INSERTION;  Surgeon: Mardell Shade, MD;  Location: [facility] INVASIVE CV LAB;  Service: Cardiovascular;  Laterality: N/A;   Patient Active Problem List   Diagnosis Date Noted   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) 08/11/2023   AKI (acute  kidney injury) (HCC) 08/08/2023   Normocytic anemia 08/05/2023   Nausea 08/05/2023   Insomnia 08/05/2023   Atrial fibrillation (HCC) 08/05/2023   Malnutrition of moderate degree 08/05/2023   Debility 08/04/2023   Protein-calorie malnutrition, severe 07/23/2023   Palliative care by specialist 07/21/2023   Cardiac arrest (HCC) 07/10/2023   Cardiogenic shock (HCC) 07/10/2023   Hyperkalemia  07/10/2023   On mechanically assisted ventilation (HCC) 07/10/2023   Acute respiratory failure with hypoxia (HCC) 07/10/2023   Bilateral pulmonary embolism (HCC) 07/09/2023   Chronic heart failure with preserved ejection fraction (HFpEF) (HCC) 07/09/2023   Severe mitral regurgitation 07/09/2023   Paroxysmal atrial fibrillation (HCC) 07/09/2023   Atrial fibrillation with rapid ventricular response (HCC) 03/20/2023   Chest pain 03/30/2022   Essential hypertension 03/30/2022   Hypomagnesemia 03/30/2022   Hyperthyroidism 10/23/2021   Elevated troponin 10/19/2021   NSTEMI (non-ST elevated myocardial infarction) (HCC) 10/19/2021    ONSET DATE: 08/13/2023 (referral date)  REFERRING DIAG: I63.9: Cerebral infarction, unspecified   THERAPY DIAG:  Abnormality of gait and mobility  Rationale for Evaluation and Treatment: Rehabilitation  SUBJECTIVE:                                                                                                                                                                                             SUBJECTIVE STATEMENT: Patient goes by "Aaron [name]"  Patient reports that he has still not established a primary care doctor. Reports no new changes or falls and near falls.   Pt accompanied by: self and driven by fiance - in the lobby  PERTINENT HISTORY:  infarcts in the right cerebral and cerebellar hemispheres, chronic systolic heart failure, mitral valve disease, CKD, Grave's disease, atrial fibrillation, cardiac arrest during hospital stay in February 2025   PAIN:  Are you having pain? No  PRECAUTIONS: Fall  RED FLAGS: None   WEIGHT BEARING RESTRICTIONS: No  FALLS: Has patient fallen in last 6 months? No  LIVING ENVIRONMENT: Family/patient expects to be discharged to:: Private residence Living Arrangements: Spouse/significant other Available Help at Discharge: Family;Available 24 hours/day Type of Home: House Home Access: Level entry Home  Layout: One level Bathroom Shower/Tub: Engineer, manufacturing systems: Standard Home Equipment: None   PLOF: Independent;Working/employed;Driving; works as a Paediatric nurse   PATIENT GOALS: "Get stronger."   OBJECTIVE:  Note: Objective measures were completed at Evaluation unless otherwise noted.  DIAGNOSTIC FINDINGS:   IMPRESSION MR Brain on 07/24/2023: Small acute infarcts in the right cerebral and cerebellar hemispheres. No generalized finding to implicate global anoxic injury. 07/24/2023:  COGNITION: Overall cognitive status: Within functional limits for tasks assessed  TREATMENT :    Self Care: Vitals:   09/16/23 1621 09/16/23 1639  BP: 128/76 (!) 166/83  Pulse: (!) 52 68  SpO2:  97%    Pre-Exercise     Post-Exercise (cardio)  Vitals assessed as noted above, WFL for therapy, HR initially bradycardiac but has appropriate response to exercise 52-63 at rest and 80-128 with activity, SPO2: 90-95% during session   PT continues to emphasize importance on establishing a PCP for long term medical management  Education for home (provided as printout)   Pulse Oximeter  At rest heart rate: 60-100 bpm ideally should never be over 120 at rest (go see doctor if this is the case),  should never be near 180 with activity, RPE: 7/10 with activity for now  SPO2%: should be between 90-100% if drops to 90 or below go see doctor ASAP   Scale: weight should not be fluctuating more than 5lbs in a day with rapid weight gain, if occurring contact doctor   TherAct: Practice for return to basketball (SBA) 1 x 30 feet forward dribble L hand, 1 x 20 feet cross body with side shuffle, 1 x 30 feet backward dribble in L hand  Figure 8 passes around leg x 10 reps Forward gait 2 passes, figure 8 repeat x 20 feet Quick mini jog 2 x 20 feet with dribbling  6 Blaze pods on random setting for improved cardiovascular endurance, coordination, dynamic  movements.  Performed on 1 minute intervals with 40 second rest periods.  Pt performs modI.  Round 1:  30 feet spaced out with dribbling in between, majority with L hand setup.  14 hits. Round 2:  30 feet spaced out with dribbling in between, majority with L hand setup.  15 hits. Round 3:  30 feet spaced out with dribbling in between, majority with L hand setup.  20 hits. Notable errors/deficits:  no major deficits, quick pace, NO LOB, appropriate turns, does report mild coordination deficits on L side though very minor Blaze pods - final round 7/10 HR: 127 bpm appropriate response to exercise  Dead lift with surge tank 3 x 10 reps (initially 5 reps without weight)  Boxing with side shuffle and 10x  cross body punches  6 x 8 feet RPE: 5/10 Boxing with side shuffle and 2x cross body punches 2x knee drives over 4 x 8 feet RPE: 4/10  PATIENT EDUCATION: Education details: Self Care session above Person educated: Patient Education method: Explanation Education comprehension: verbalized understanding and needs further education  HOME EXERCISE PROGRAM: Seated and static standing basketball dribbling for coordination task Dead lifts 3 x 10 reps    GOALS: Goals reviewed with patient? Yes  LONG TERM GOALS: Target date: 10/15/2023 (STG = LTG due to POC length)  Patient will report demonstrate independence with final HEP in order to maintain current gains and continue to progress after physical therapy discharge.   Baseline: To be provided  Goal status: INITIAL  2.  Patient will improve their 5x Sit to Stand score to less than 11  seconds to demonstrate a decreased risk for falls and improved LE strength.   Baseline: 14.18 seconds without UE use Goal status: INITIAL  3.  to be assessed / LTG written  Baseline: 1202 feet (modI) Goal status: D/C as able to perform at high level with minimal fatigue   ASSESSMENT:  CLINICAL IMPRESSION: Skilled PT session emphasized progression of  cardiovascular endurance and high level coordination tasks for progression towards PLOF. Patient without  reports of chest or L back pain and appropriate HR response. Does demonstrate flat affect during session and has not followed up with about establishing PCP despite PT urging in multiple sessions. Continue POC as able.  OBJECTIVE IMPAIRMENTS: decreased coordination, decreased endurance, and impaired tone.   ACTIVITY LIMITATIONS: locomotion level  PARTICIPATION LIMITATIONS: community activity and occupation  PERSONAL FACTORS: Age, Past/current experiences, Profession, and 3+ comorbidities: see above  are also affecting patient's functional outcome.   REHAB POTENTIAL: Good  CLINICAL DECISION MAKING: Stable/uncomplicated  EVALUATION COMPLEXITY: Low  PLAN:  PT FREQUENCY: 1x/week  PT DURATION: 4 weeks  PLANNED INTERVENTIONS: 97164- PT Re-evaluation, 97110-Therapeutic exercises, 97530- Therapeutic activity, 97112- Neuromuscular re-education, 97535- Self Care, 40981- Manual therapy, and 97116- Gait training  PLAN FOR NEXT SESSION: work on high level balance and activity tasks, incoperate basketball as appropriate, monitor cardiac function, cardiorespiratory training - walk program?, coordination tasks for LLE    For all possible CPT codes, reference the Planned Interventions line above.     Check all conditions that are expected to impact treatment: {Conditions expected to impact treatment:Respiratory disorders and Neurological condition and/or seizures   If treatment provided at initial evaluation, no treatment charged due to lack of authorization.      Coreen Devoid, PT, DPT 09/17/2023, 8:51 AM

## 2023-09-16 NOTE — Patient Instructions (Signed)
 Access Code: [email or web address] URL: [email or web address] Date: 09/16/2023 Prepared by: Sudie Ely  Exercises - Putty Squeezes  - 1-2 x daily - 10 reps - Rolling Putty on Table  - 1-2 x daily - 10 reps - 3-Point Pinch with Putty  - 1-2 x daily - 10 reps - Tip PUSH with Putty  - 1-2 x daily - 10 reps - Removing Marbles from Putty  - 1-2 x daily - 10 reps

## 2023-09-23 ENCOUNTER — Ambulatory Visit: Admitting: Physical Therapy

## 2023-09-23 ENCOUNTER — Encounter: Payer: Self-pay | Admitting: Physical Therapy

## 2023-09-23 ENCOUNTER — Ambulatory Visit: Admitting: Occupational Therapy

## 2023-09-23 VITALS — BP 129/75 | HR 50

## 2023-09-23 DIAGNOSIS — R41842 Visuospatial deficit: Secondary | ICD-10-CM

## 2023-09-23 DIAGNOSIS — R29818 Other symptoms and signs involving the nervous system: Secondary | ICD-10-CM | POA: Diagnosis not present

## 2023-09-23 DIAGNOSIS — R29898 Other symptoms and signs involving the musculoskeletal system: Secondary | ICD-10-CM | POA: Diagnosis not present

## 2023-09-23 DIAGNOSIS — R208 Other disturbances of skin sensation: Secondary | ICD-10-CM

## 2023-09-23 DIAGNOSIS — R278 Other lack of coordination: Secondary | ICD-10-CM | POA: Diagnosis not present

## 2023-09-23 DIAGNOSIS — R269 Unspecified abnormalities of gait and mobility: Secondary | ICD-10-CM

## 2023-09-23 DIAGNOSIS — R251 Tremor, unspecified: Secondary | ICD-10-CM | POA: Diagnosis not present

## 2023-09-23 NOTE — Therapy (Signed)
 OUTPATIENT OCCUPATIONAL THERAPY NEURO TREATMENT  Patient Name: Aaron [name] MRN: [id number] DOB:May 08, 1986, 38 y.o., male Today's Date: 09/23/2023  PCP: No PCP per pt  REFERRING PROVIDER: Zelda Hickman, PA-C   END OF SESSION:  OT End of Session - 09/23/23 1537     Visit Number 4    Number of Visits 13    Date for OT Re-Evaluation 10/24/23    Authorization Type [place] [employer]    OT Start Time 1537    OT Stop Time 1616    OT Time Calculation (min) 39 min    Equipment Utilized During Treatment Blue putty, cups    Activity Tolerance Patient tolerated treatment well    Behavior During Therapy WFL for tasks assessed/performed             Past Medical History:  Diagnosis Date   Atrial fibrillation (HCC)    Hyperthyroidism    Mitral regurgitation    NSTEMI (non-ST elevated myocardial infarction) [facility])    Past Surgical History:  Procedure Laterality Date   ECMO CANNULATION N/A 07/10/2023   Procedure: ECMO CANNULATION;  Surgeon: Mardell Shade, MD;  Location: [facility] INVASIVE CV LAB;  Service: Cardiovascular;  Laterality: N/A;   EMBOLECTOMY Left 07/18/2023   Procedure: EMBOLECTOMY Left Femoral, Popliteal and iliac arterys,  Repair Left common femoral artery.;  Surgeon: Kayla Part, MD;  Location: [facility] OR;  Service: Vascular;  Laterality: Left;   INTRAOPERATIVE TRANSESOPHAGEAL ECHOCARDIOGRAM N/A 07/23/2023   Procedure: ECHOCARDIOGRAM, TRANSESOPHAGEAL, INTRAOPERATIVE;  Surgeon: Zelphia Higashi, MD;  Location: [facility] OR;  Service: Open Heart Surgery;  Laterality: N/A;   IR FLUORO GUIDE CV LINE RIGHT  08/08/2023   IR REMOVAL TUN CV CATH W/O FL  08/14/2023   IR US  GUIDE VASC ACCESS RIGHT  08/08/2023   LEFT HEART CATH AND CORONARY ANGIOGRAPHY N/A 10/22/2021   Procedure: LEFT HEART CATH AND CORONARY ANGIOGRAPHY;  Surgeon: Sammy Crisp, MD;  Location: [facility] INVASIVE CV LAB;  Service: Cardiovascular;  Laterality: N/A;   PLACEMENT OF IMPELLA LEFT VENTRICULAR ASSIST DEVICE Right 07/14/2023    Procedure: PLACEMENT OF RIGHT AXILLARY IMPELLA 5.5 LEFT VENTRICULAR ASSIST DEVICE;  Surgeon: Zelphia Higashi, MD;  Location: [facility] OR;  Service: Open Heart Surgery;  Laterality: Right;   REMOVAL OF IMPELLA LEFT VENTRICULAR ASSIST DEVICE Right 07/14/2023   Procedure: REMOVAL OF CP RIGHT FEMORAL IMPELLA LEFT VENTRICULAR ASSIST DEVICE;  Surgeon: Zelphia Higashi, MD;  Location: [facility] OR;  Service: Open Heart Surgery;  Laterality: Right;   REMOVAL OF IMPELLA LEFT VENTRICULAR ASSIST DEVICE N/A 07/23/2023   Procedure: REMOVAL, CARDIAC ASSIST DEVICE, IMPELLA;  Surgeon: Zelphia Higashi, MD;  Location: [facility] OR;  Service: Open Heart Surgery;  Laterality: N/A;   TRANSCATHETER MITRAL EDGE TO EDGE REPAIR N/A 07/16/2023   Procedure: TRANSCATHETER MITRAL EDGE TO EDGE REPAIR;  Surgeon: Arnoldo Lapping, MD;  Location[facility] INVASIVE CV LAB;  Service: Cardiovascular;  Laterality: N/A;   TRANSESOPHAGEAL ECHOCARDIOGRAM (CATH LAB) N/A 07/16/2023   Procedure: TRANSESOPHAGEAL ECHOCARDIOGRAM;  Surgeon: Arnoldo Lapping, MD;  Location: [facility] INVASIVE CV LAB;  Service: Cardiovascular;  Laterality: N/A;   VENTRICULAR ASSIST DEVICE INSERTION N/A 07/10/2023   Procedure: VENTRICULAR ASSIST DEVICE INSERTION;  Surgeon: Mardell Shade, MD;  Location: [facility] INVASIVE CV LAB;  Service: Cardiovascular;  Laterality: N/A;   Patient Active Problem List   Diagnosis Date Noted   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) 08/11/2023   AKI (acute kidney injury) (HCC) 08/08/2023   Normocytic anemia 08/05/2023  Nausea 08/05/2023   Insomnia 08/05/2023   Atrial fibrillation (HCC) 08/05/2023   Malnutrition of moderate degree 08/05/2023   Debility 08/04/2023   Protein-calorie malnutrition, severe 07/23/2023   Palliative care by specialist 07/21/2023   Cardiac arrest (HCC) 07/10/2023   Cardiogenic shock (HCC) 07/10/2023   Hyperkalemia 07/10/2023   On mechanically assisted ventilation (HCC) 07/10/2023   Acute  respiratory failure with hypoxia (HCC) 07/10/2023   Bilateral pulmonary embolism (HCC) 07/09/2023   Chronic heart failure with preserved ejection fraction (HFpEF) (HCC) 07/09/2023   Severe mitral regurgitation 07/09/2023   Paroxysmal atrial fibrillation (HCC) 07/09/2023   Atrial fibrillation with rapid ventricular response (HCC) 03/20/2023   Chest pain 03/30/2022   Essential hypertension 03/30/2022   Hypomagnesemia 03/30/2022   Hyperthyroidism 10/23/2021   Elevated troponin 10/19/2021   NSTEMI (non-ST elevated myocardial infarction) (HCC) 10/19/2021    ONSET DATE: 08/13/2023 (referral date)  REFERRING DIAG: I63.9: Cerebral infarction, unspecified   THERAPY DIAG:  Other lack of coordination  Tremor  Other symptoms and signs involving the nervous system  Other disturbances of skin sensation  Visuospatial deficit  Other symptoms and signs involving the musculoskeletal system  Rationale for Evaluation and Treatment: Rehabilitation  SUBJECTIVE:   SUBJECTIVE STATEMENT:  Pt's fiance reports he sometimes becomes frustrated with his memory and doesn't process things as quickly as he used to. Pt states this is not an issue at this time.   Pt reports a recent change in medication but not noted in med list.  Pt reports he thinks they discharged oxycodone  and added a couple of meds but is not "exactly sure" therefore was encouraged to bring his meds/list next time.   Re: vision changes, pt reported he may have blurry vision for a couple of moments that resolve but does feel like he is seeing things in the periphery sometimes but there is nothing there.    Pt accompanied by: self and [name], fiance  PERTINENT HISTORY: chronic systolic heart failure, mitral valve disease, CKD, Grave's disease, atrial fibrillation  Per 08/20/23 MD Progress Notes: "Patient with a longstanding history of primary mitral regurgitation with reduced ejection fraction as well as Grave's disease.  He had had  progressive heart failure as an outpatient.  He was admitted in February 2025 for progressive shortness of breath.  He was found to have a small pulmonary embolism without significant right heart strain.  He was given fluids, nodal blockade for A-fib with RVR and subsequently became altered, hypoxic, with ultimate cardiac arrest.   ECMO team was consulted and patient was emergently taken to [facility] for ECMO cannulation with Impella CP drainage.  He was upgraded to a 5.5 and given his severe mitral regurgitation was arranged to have MitraClip performed.  Following MitraClip he was able to come off hemodynamic support.  Course complicated by acute renal failure requiring dialysis that slowly improved over the course of his admission.  He went to CIR and was discharged without hemodialysis catheter"  PRECAUTIONS: Fall  WEIGHT BEARING RESTRICTIONS: No  PAIN: Spasms in leg and where he has incisions - "discomfort" - both groins, both sides of neck and in back behind his heart and up to his neck.  Are you having pain? Yes: NPRS scale: 4/10 Pain location: incisions Pain description: sore Aggravating factors: unknown Relieving factors: rest  FALLS: Has patient fallen in last 6 months? No  LIVING ENVIRONMENT: Family/patient expects to be discharged to:: Private residence Living Arrangements: Spouse/significant other Available Help at Discharge: Family;Available 24 hours/day Type of  Home: House Home Access: Level entry Home Layout: One level Bathroom Shower/Tub: Engineer, manufacturing systems: Standard Home Equipment: None  PLOF: Independent;Working/employed;Driving; works as a Paediatric nurse   PATIENT GOALS: return to work/PLOF  OBJECTIVE:  Note: Objective measures were completed at Evaluation unless otherwise noted.  HAND DOMINANCE: Right  ADLs: Overall ADLs: mod I  IADLs: Shopping: mod A Light housekeeping: mod I Meal Prep: mod I Community mobility: dependent Medication management:  mod I Financial management: mod I Handwriting: 90% legible; has not tried typing  MOBILITY STATUS: Independent  ACTIVITY TOLERANCE: Activity tolerance: fair  FUNCTIONAL OUTCOME MEASURES: PSFS: 5.7 total score  Total score = sum of the activity scores/number of activities Minimum detectable change (90%CI) for average score = 2 points Minimum detectable change (90%CI) for single activity score = 3 points   UPPER EXTREMITY ROM and MMT:    BUE WNL though poor endurance  HAND FUNCTION: Grip strength: Right: 113.7 lbs; Left: 116.8 lbs  COORDINATION: 9 Hole Peg test: Right: 25 sec; Left: 35 sec  SENSATION: Paresthesias reported B  EDEMA: none reported or observed  MUSCLE TONE:BUE WFL  COGNITION: Overall cognitive status: Within functional limits for tasks assessed  VISION: Subjective report: some recent episodes of blurry/diplopia that has resolved Baseline vision: No visual deficits Visual history: none  VISION ASSESSMENT: WFL  PERCEPTION: WFL  PRAXIS: WFL  OBSERVATIONS: Mild B tremor. Appears well-kept. No AD for ambulation, no LOB.                                                                                                                            TODAY'S TREATMENT:   Per subjective, dicussed ST referral and what all that would entail. Pt opting not to proceed with referral.   Wrist flex and ext with tan (12.5 lbs) flex bar x 2 min for strength and endurance of affected extremity  Supination with tan (12.5 lbs) flex bar x 2 min for strength and endurance of affected extremity  Pronation with tan (12.5 lbs) flex bar x 2 min for strength and endurance of affected extremity  OT educated pt on visual scanning activities as noted in pt instructions.   OT reviewed table top play of Golf Solitaire for BUE ROM, coordination, visual processing, scanning, and sequencing. Pt required minimal cues for proper play.   PATIENT EDUCATION: Education details:  strengthening; coordination Person educated: Patient and fiance Education method: Explanation, Demonstration, Tactile cues, Verbal cues, and Handouts Education comprehension: verbalized understanding, returned demonstration, verbal cues required, tactile cues required, and needs further education  HOME EXERCISE PROGRAM: 09/09/23: Coordination Activities 09/16/23: Putty Activities Access Code: [facility] 09/23/2023: visual scanning  GOALS:  SHORT TERM GOALS: Target date: 09/29/2023  Patient will demonstrate initial BUE HEP with 25% verbal cues or less for proper execution. Baseline: New to outpt OT  Goal status: IN Progress  2.  Patient will return demonstration with use of DME as needed for tremor reduction.  Baseline: New to outpt  OT  Goal status: IN Progress  3.  Patient will independently verbalize at least 3 energy conservation principles in relation to ADLs to increase functional independence. Baseline: New to outpt OT  Goal status: INITIAL  4.  Patient will independently recall at least 2 vision strategies without cueing. Baseline: New to outpt OT  Goal status: INITIAL  5.  Pt will independently recall the 5 main sensory precautions (cold, heat, sharp, chemical, and heavy) as needed to prevent injury/harm secondary to impairments.  Baseline: New to outpt OT Goal Status: INITIAL   LONG TERM GOALS: Target date: 10/24/2023   Patient will demonstrate updated BUE HEP with visual handouts only for proper execution. Baseline: New to outpt OT  Goal status: INITIAL  2.  Patient will report at least two-point increase in average PSFS score or at least three-point increase in a single activity score indicating functionally significant improvement given minimum detectable change.  Baseline: 5.7 total score (See above for individual activity scores) Goal status: INITIAL  ASSESSMENT:  CLINICAL IMPRESSION: Patient demonstrates good understanding of HEP and strategies as needed to  progress towards goals. Will continue to monitor for cognitive impairments which impact functional performance and refer to ST as needed/if pt agreeable.  PERFORMANCE DEFICITS: in functional skills including ADLs, IADLs, coordination, dexterity, Fine motor control, endurance, decreased knowledge of precautions, decreased knowledge of use of DME, vision, and UE functional use.   IMPAIRMENTS: are limiting patient from ADLs, IADLs, rest and sleep, work, leisure, and social participation.   CO-MORBIDITIES: may have co-morbidities  that affects occupational performance. Patient will benefit from skilled OT to address above impairments and improve overall function.  REHAB POTENTIAL: Good  PLAN:  OT FREQUENCY: 2x/week  OT DURATION: 6 weeks  PLANNED INTERVENTIONS: 97168 OT Re-evaluation, 97535 self care/ADL training, 16109 therapeutic exercise, 97530 therapeutic activity, 97112 neuromuscular re-education, 97140 manual therapy, 97035 ultrasound, 97018 paraffin, 60454 fluidotherapy, 97032 electrical stimulation (manual), 97760 Orthotic Initial, [id number] Orthotic/Prosthetic subsequent, functional mobility training, visual/perceptual remediation/compensation, energy conservation, coping strategies training, patient/family education, and DME and/or AE instructions  RECOMMENDED OTHER SERVICES: N/A for this visit  CONSULTED AND AGREED WITH PLAN OF CARE: Patient  PLAN FOR NEXT SESSIONS: monitor BP  Diplopia HEP Sensory precautions Energy Conservation strategies Tremor strategies (intro 4/29) Coordination HEP (handout 4/29) Putty (5/6) vs desensitization strategies Theraband HEP (endurance) [email or web address] Manual/cupping trialled  Altamease Asters, OT 09/23/2023, 5:27 PM

## 2023-09-23 NOTE — Patient Instructions (Signed)
Activities to try at home to encourage visual scanning:   1. Word searches 2. Mazes 3. Puzzles 4. Card games 5. Computer games and/or searches 6. Connect-the-dots  Activities for environmental (larger) scanning:  1. With supervision, scan for items in grocery store or drugstore.  Begin with a familiar store, then progress to a new store you've never been in before. Make sure you have supervision with this.  2. With supervision, tell a family member or caregiver when it is safe to cross a street after looking all directions and any side streets. However, do NOT cross street unless family member or caregiver is with you and says it is OK 

## 2023-09-23 NOTE — Therapy (Signed)
 OUTPATIENT PHYSICAL THERAPY NEURO TREATMENT / DISCHARGE   Patient Name: Aaron [name] MRN: [id number] DOB:1985-12-09, 38 y.o., male Today's Date: 09/23/2023   PCP: PT again emphasizes importance of PCP and with patient permission provides contact information  REFERRING PROVIDER: Love, Renay Carota, PA-C  PHYSICAL THERAPY DISCHARGE SUMMARY  Visits from Start of Care: 4  Current functional level related to goals / functional outcomes: Achieved 2/2 LTG   Remaining deficits: Fatigue   Education / Equipment: Recommend behavior health for patient, when to return to PT, activity modification/pacing   Patient agrees to discharge. Patient goals were met. Patient is being discharged due to meeting the stated rehab goals.   END OF SESSION:  PT End of Session - 09/23/23 1621     Visit Number 4    Number of Visits 5    Date for PT Re-Evaluation 10/15/23    Authorization Type [place] Medicaid    PT Start Time 1618    PT Stop Time 1643    PT Time Calculation (min) 25 min    Equipment Utilized During Treatment Gait belt    Activity Tolerance Patient tolerated treatment well    Behavior During Therapy WFL for tasks assessed/performed            Past Medical History:  Diagnosis Date   Atrial fibrillation (HCC)    Hyperthyroidism    Mitral regurgitation    NSTEMI (non-ST elevated myocardial infarction) [facility])    Past Surgical History:  Procedure Laterality Date   ECMO CANNULATION N/A 07/10/2023   Procedure: ECMO CANNULATION;  Surgeon: Mardell Shade, MD;  Location: [facility] INVASIVE CV LAB;  Service: Cardiovascular;  Laterality: N/A;   EMBOLECTOMY Left 07/18/2023   Procedure: EMBOLECTOMY Left Femoral, Popliteal and iliac arterys,  Repair Left common femoral artery.;  Surgeon: Kayla Part, MD;  Location: [facility] OR;  Service: Vascular;  Laterality: Left;   INTRAOPERATIVE TRANSESOPHAGEAL ECHOCARDIOGRAM N/A 07/23/2023   Procedure: ECHOCARDIOGRAM, TRANSESOPHAGEAL, INTRAOPERATIVE;  Surgeon:  Zelphia Higashi, MD;  Location: [facility] OR;  Service: Open Heart Surgery;  Laterality: N/A;   IR FLUORO GUIDE CV LINE RIGHT  08/08/2023   IR REMOVAL TUN CV CATH W/O FL  08/14/2023   IR US  GUIDE VASC ACCESS RIGHT  08/08/2023   LEFT HEART CATH AND CORONARY ANGIOGRAPHY N/A 10/22/2021   Procedure: LEFT HEART CATH AND CORONARY ANGIOGRAPHY;  Surgeon: Sammy Crisp, MD;  Location: [facility] INVASIVE CV LAB;  Service: Cardiovascular;  Laterality: N/A;   PLACEMENT OF IMPELLA LEFT VENTRICULAR ASSIST DEVICE Right 07/14/2023   Procedure: PLACEMENT OF RIGHT AXILLARY IMPELLA 5.5 LEFT VENTRICULAR ASSIST DEVICE;  Surgeon: Zelphia Higashi, MD;  Location: [facility] OR;  Service: Open Heart Surgery;  Laterality: Right;   REMOVAL OF IMPELLA LEFT VENTRICULAR ASSIST DEVICE Right 07/14/2023   Procedure: REMOVAL OF CP RIGHT FEMORAL IMPELLA LEFT VENTRICULAR ASSIST DEVICE;  Surgeon: Zelphia Higashi, MD;  Location: [facility] OR;  Service: Open Heart Surgery;  Laterality: Right;   REMOVAL OF IMPELLA LEFT VENTRICULAR ASSIST DEVICE N/A 07/23/2023   Procedure: REMOVAL, CARDIAC ASSIST DEVICE, IMPELLA;  Surgeon: Zelphia Higashi, MD;  Location: [facility] OR;  Service: Open Heart Surgery;  Laterality: N/A;   TRANSCATHETER MITRAL EDGE TO EDGE REPAIR N/A 07/16/2023   Procedure: TRANSCATHETER MITRAL EDGE TO EDGE REPAIR;  Surgeon: Arnoldo Lapping, MD;  Location: [facility] INVASIVE CV LAB;  Service: Cardiovascular;  Laterality: N/A;   TRANSESOPHAGEAL ECHOCARDIOGRAM (CATH LAB) N/A 07/16/2023   Procedure: TRANSESOPHAGEAL ECHOCARDIOGRAM;  Surgeon: Arnoldo Lapping, MD;  Location: [facility]  INVASIVE CV LAB;  Service: Cardiovascular;  Laterality: N/A;   VENTRICULAR ASSIST DEVICE INSERTION N/A 07/10/2023   Procedure: VENTRICULAR ASSIST DEVICE INSERTION;  Surgeon: Mardell Shade, MD;  Location: [facility] INVASIVE CV LAB;  Service: Cardiovascular;  Laterality: N/A;   Patient Active Problem List   Diagnosis Date Noted   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) 08/11/2023   AKI (acute kidney injury) (HCC) 08/08/2023   Normocytic anemia 08/05/2023   Nausea 08/05/2023   Insomnia 08/05/2023   Atrial fibrillation (HCC) 08/05/2023   Malnutrition of moderate degree 08/05/2023   Debility 08/04/2023   Protein-calorie malnutrition, severe 07/23/2023   Palliative care by specialist 07/21/2023   Cardiac arrest (HCC) 07/10/2023   Cardiogenic shock (HCC) 07/10/2023   Hyperkalemia 07/10/2023   On mechanically assisted ventilation (HCC) 07/10/2023   Acute respiratory failure with hypoxia (HCC) 07/10/2023   Bilateral pulmonary embolism (HCC) 07/09/2023   Chronic heart failure with preserved ejection fraction (HFpEF) (HCC) 07/09/2023   Severe mitral regurgitation 07/09/2023   Paroxysmal atrial fibrillation (HCC) 07/09/2023   Atrial fibrillation with rapid ventricular response (HCC) 03/20/2023   Chest pain 03/30/2022   Essential hypertension 03/30/2022   Hypomagnesemia 03/30/2022   Hyperthyroidism 10/23/2021   Elevated troponin 10/19/2021   NSTEMI (non-ST elevated myocardial infarction) (HCC) 10/19/2021    ONSET DATE: 08/13/2023 (referral date)  REFERRING DIAG: I63.9: Cerebral infarction, unspecified   THERAPY DIAG:  Abnormality of gait and mobility  Rationale for Evaluation and Treatment: Rehabilitation  SUBJECTIVE:                                                                                                                                                                                             SUBJECTIVE STATEMENT: Patient goes by "Aaron [name]"  Patient reports that he has still not established a primary care doctor. Reports no new changes or falls and near falls.   Pt accompanied by: self and [name] fiance came back to session  PERTINENT HISTORY:  infarcts in the right cerebral and cerebellar hemispheres, chronic systolic heart failure, mitral valve disease, CKD, Grave's disease, atrial fibrillation, cardiac arrest  during hospital stay in February 2025   PAIN:  Are you having pain? No  PRECAUTIONS: Fall  RED FLAGS: None   WEIGHT BEARING RESTRICTIONS: No  FALLS: Has patient fallen in last 6 months? No  LIVING ENVIRONMENT: Family/patient expects to be discharged to:: Private residence Living Arrangements: Spouse/significant other Available Help at Discharge: Family;Available 24 hours/day Type of Home: House Home Access: Level entry Home Layout: One level Bathroom Shower/Tub: Engineer, manufacturing systems: Standard Home Equipment: None   PLOF:  Independent;Working/employed;Driving; works as a Paediatric nurse   PATIENT GOALS: "Get stronger."   OBJECTIVE:  Note: Objective measures were completed at Evaluation unless otherwise noted.  DIAGNOSTIC FINDINGS:   IMPRESSION MR Brain on 07/24/2023: Small acute infarcts in the right cerebral and cerebellar hemispheres. No generalized finding to implicate global anoxic injury. 07/24/2023:  COGNITION: Overall cognitive status: Within functional limits for tasks assessed                                                            TREATMENT :    Self Care: Vitals:   09/23/23 1639  BP: 129/75  Pulse: (!) 50   Vitals assessed as noted above, WFL for therapy, HR remains bradicardiac so recommend follow up with upcoming cardiology session Patient fiance present so reviewed information about HR ranges from former session, fiance states will help establish appointment with PCP for patient  Reiterated education and provided written note for fiance with patient permission  Pulse Oximeter  At rest heart rate: 60-100 bpm ideally should never be over 120 or in 40s at rest (go see doctor if this is the case),  should never be near 180 with activity, RPE: 7/10 with activity for now  SPO2%: should be between 90-100% if drops to 90 or below go see doctor ASAP   Scale: weight should not be fluctuating more than 5lbs in a day with rapid weight gain, if occurring  contact doctor   TherAct:  [facility] PT Assessment - 09/23/23 0001       Standardized Balance Assessment   Standardized Balance Assessment Five Times Sit to Stand    Five times sit to stand comments  9.78   seconds without UE use (ind)          Discussed physical function and activity modification and progress towards goals Discussed behavior health consult as mental piece is fiance main concern for patient at this time and patient verbalized would be interested in getting connected, discussed how to bring up at next appointment with medical team  PATIENT EDUCATION: Education details: Self Care session above Person educated: Patient Education method: Explanation Education comprehension: verbalized understanding and needs further education  HOME EXERCISE PROGRAM: Seated and static standing basketball dribbling for coordination task Dead lifts 3 x 10 reps    GOALS: Goals reviewed with patient? Yes  LONG TERM GOALS: Target date: 10/15/2023 (STG = LTG due to POC length)  Patient will report demonstrate independence with final HEP in order to maintain current gains and continue to progress after physical therapy discharge.   Baseline: To be provided, reports compliance with HEP Goal status: MET  2.  Patient will improve their 5x Sit to Stand score to less than 11  seconds to demonstrate a decreased risk for falls and improved LE strength.   Baseline: 14.18 seconds without UE use Goal status: INITIAL  3.  to be assessed / LTG written  Baseline: 1202 feet (modI) Goal status: D/C as able to perform at high level with minimal fatigue   ASSESSMENT:  CLINICAL IMPRESSION: Patient is discharging from skilled PT due to achieving all LTG. Fiance present for session and expresses some concerns about patient mental health so PT strongly recommends they get connected to behavioral health; PT educates on process and patient verbalizes understanding and agreement.  PT also reviewed prior  education on activity modifications and things to monitor/watch out for given cardiac history. Patient independent in HEP and all goals met so no further PT services indicated at this time.  OBJECTIVE IMPAIRMENTS: decreased coordination, decreased endurance, and impaired tone.   ACTIVITY LIMITATIONS: locomotion level  PARTICIPATION LIMITATIONS: community activity and occupation  PERSONAL FACTORS: Age, Past/current experiences, Profession, and 3+ comorbidities: see above are also affecting patient's functional outcome.   REHAB POTENTIAL: Good  CLINICAL DECISION MAKING: Stable/uncomplicated  EVALUATION COMPLEXITY: Low  PLAN:  PT FREQUENCY: 1x/week  PT DURATION: 4 weeks  PLANNED INTERVENTIONS: 97164- PT Re-evaluation, 97110-Therapeutic exercises, 97530- Therapeutic activity, [id number]- Neuromuscular re-education, 97535- Self Care, 16109- Manual therapy, and 97116- Gait training  PLAN FOR NEXT SESSION: NA - D/C with updated HEP    For all possible CPT codes, reference the Planned Interventions line above.     Check all conditions that are expected to impact treatment: {Conditions expected to impact treatment:Respiratory disorders and Neurological condition and/or seizures   If treatment provided at initial evaluation, no treatment charged due to lack of authorization.      Coreen Devoid, PT, DPT 09/23/2023, 5:00 PM

## 2023-09-30 ENCOUNTER — Ambulatory Visit: Admitting: Physical Therapy

## 2023-09-30 ENCOUNTER — Ambulatory Visit: Admitting: Occupational Therapy

## 2023-09-30 DIAGNOSIS — R251 Tremor, unspecified: Secondary | ICD-10-CM

## 2023-09-30 DIAGNOSIS — R29818 Other symptoms and signs involving the nervous system: Secondary | ICD-10-CM | POA: Diagnosis not present

## 2023-09-30 DIAGNOSIS — R41842 Visuospatial deficit: Secondary | ICD-10-CM

## 2023-09-30 DIAGNOSIS — R278 Other lack of coordination: Secondary | ICD-10-CM

## 2023-09-30 DIAGNOSIS — R29898 Other symptoms and signs involving the musculoskeletal system: Secondary | ICD-10-CM

## 2023-09-30 DIAGNOSIS — R269 Unspecified abnormalities of gait and mobility: Secondary | ICD-10-CM | POA: Diagnosis not present

## 2023-09-30 DIAGNOSIS — R208 Other disturbances of skin sensation: Secondary | ICD-10-CM

## 2023-09-30 NOTE — Therapy (Signed)
 OUTPATIENT OCCUPATIONAL THERAPY NEURO TREATMENT  Patient Name: Aaron [name] MRN: [id number] DOB:08-Feb-1986, 38 y.o., male Today's Date: 09/30/2023  PCP: No PCP per pt  REFERRING PROVIDER: Zelda Hickman, PA-C   END OF SESSION:  OT End of Session - 09/30/23 1622     Visit Number 5    Number of Visits 13    Date for OT Re-Evaluation 10/24/23    Authorization Type [place] [employer]    OT Start Time 1622    OT Stop Time 1700    OT Time Calculation (min) 38 min    Equipment Utilized During Treatment Blue putty, cups    Activity Tolerance Patient tolerated treatment well    Behavior During Therapy WFL for tasks assessed/performed             Past Medical History:  Diagnosis Date   Atrial fibrillation (HCC)    Hyperthyroidism    Mitral regurgitation    NSTEMI (non-ST elevated myocardial infarction) [facility])    Past Surgical History:  Procedure Laterality Date   ECMO CANNULATION N/A 07/10/2023   Procedure: ECMO CANNULATION;  Surgeon: Mardell Shade, MD;  Location: [facility] INVASIVE CV LAB;  Service: Cardiovascular;  Laterality: N/A;   EMBOLECTOMY Left 07/18/2023   Procedure: EMBOLECTOMY Left Femoral, Popliteal and iliac arterys,  Repair Left common femoral artery.;  Surgeon: Kayla Part, MD;  Location: [facility] OR;  Service: Vascular;  Laterality: Left;   INTRAOPERATIVE TRANSESOPHAGEAL ECHOCARDIOGRAM N/A 07/23/2023   Procedure: ECHOCARDIOGRAM, TRANSESOPHAGEAL, INTRAOPERATIVE;  Surgeon: Zelphia Higashi, MD;  Location: [facility] OR;  Service: Open Heart Surgery;  Laterality: N/A;   IR FLUORO GUIDE CV LINE RIGHT  08/08/2023   IR REMOVAL TUN CV CATH W/O FL  08/14/2023   IR US  GUIDE VASC ACCESS RIGHT  08/08/2023   LEFT HEART CATH AND CORONARY ANGIOGRAPHY N/A 10/22/2021   Procedure: LEFT HEART CATH AND CORONARY ANGIOGRAPHY;  Surgeon: Sammy Crisp, MD;  Location: [facility] INVASIVE CV LAB;  Service: Cardiovascular;  Laterality: N/A;   PLACEMENT OF IMPELLA LEFT VENTRICULAR ASSIST DEVICE Right 07/14/2023    Procedure: PLACEMENT OF RIGHT AXILLARY IMPELLA 5.5 LEFT VENTRICULAR ASSIST DEVICE;  Surgeon: Zelphia Higashi, MD;  Location: [facility] OR;  Service: Open Heart Surgery;  Laterality: Right;   REMOVAL OF IMPELLA LEFT VENTRICULAR ASSIST DEVICE Right 07/14/2023   Procedure: REMOVAL OF CP RIGHT FEMORAL IMPELLA LEFT VENTRICULAR ASSIST DEVICE;  Surgeon: Zelphia Higashi, MD;  Location: [facility] OR;  Service: Open Heart Surgery;  Laterality: Right;   REMOVAL OF IMPELLA LEFT VENTRICULAR ASSIST DEVICE N/A 07/23/2023   Procedure: REMOVAL, CARDIAC ASSIST DEVICE, IMPELLA;  Surgeon: Zelphia Higashi, MD;  Location: [facility] OR;  Service: Open Heart Surgery;  Laterality: N/A;   TRANSCATHETER MITRAL EDGE TO EDGE REPAIR N/A 07/16/2023   Procedure: TRANSCATHETER MITRAL EDGE TO EDGE REPAIR;  Surgeon: Arnoldo Lapping, MD;  Location: [facility] INVASIVE CV LAB;  Service: Cardiovascular;  Laterality: N/A;   TRANSESOPHAGEAL ECHOCARDIOGRAM (CATH LAB) N/A 07/16/2023   Procedure: TRANSESOPHAGEAL ECHOCARDIOGRAM;  Surgeon: Arnoldo Lapping, MD;  Location: [facility] INVASIVE CV LAB;  Service: Cardiovascular;  Laterality: N/A;   VENTRICULAR ASSIST DEVICE INSERTION N/A 07/10/2023   Procedure: VENTRICULAR ASSIST DEVICE INSERTION;  Surgeon: Mardell Shade, MD;  Location: [facility] INVASIVE CV LAB;  Service: Cardiovascular;  Laterality: N/A;   Patient Active Problem List   Diagnosis Date Noted   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) 08/11/2023   AKI (acute kidney injury) (HCC) 08/08/2023   Normocytic anemia 08/05/2023  Nausea 08/05/2023   Insomnia 08/05/2023   Atrial fibrillation (HCC) 08/05/2023   Malnutrition of moderate degree 08/05/2023   Debility 08/04/2023   Protein-calorie malnutrition, severe 07/23/2023   Palliative care by specialist 07/21/2023   Cardiac arrest (HCC) 07/10/2023   Cardiogenic shock (HCC) 07/10/2023   Hyperkalemia 07/10/2023   On mechanically assisted ventilation (HCC) 07/10/2023   Acute  respiratory failure with hypoxia (HCC) 07/10/2023   Bilateral pulmonary embolism (HCC) 07/09/2023   Chronic heart failure with preserved ejection fraction (HFpEF) (HCC) 07/09/2023   Severe mitral regurgitation 07/09/2023   Paroxysmal atrial fibrillation (HCC) 07/09/2023   Atrial fibrillation with rapid ventricular response (HCC) 03/20/2023   Chest pain 03/30/2022   Essential hypertension 03/30/2022   Hypomagnesemia 03/30/2022   Hyperthyroidism 10/23/2021   Elevated troponin 10/19/2021   NSTEMI (non-ST elevated myocardial infarction) (HCC) 10/19/2021    ONSET DATE: 08/13/2023 (referral date)  REFERRING DIAG: I63.9: Cerebral infarction, unspecified   THERAPY DIAG:  Other lack of coordination  Tremor  Other symptoms and signs involving the nervous system  Other disturbances of skin sensation  Visuospatial deficit  Other symptoms and signs involving the musculoskeletal system  Rationale for Evaluation and Treatment: Rehabilitation  SUBJECTIVE:   SUBJECTIVE STATEMENT:  He has been using lubricating eye drops with improvement to eye watering. No diplopia in awhile.   Re: vision changes, pt reported he may have blurry vision for a couple of moments that resolve but does feel like he is seeing things in the periphery sometimes but there is nothing there.    Pt accompanied by: self and [name], fiance  PERTINENT HISTORY: chronic systolic heart failure, mitral valve disease, CKD, Grave's disease, atrial fibrillation  Per 08/20/23 MD Progress Notes: "Patient with a longstanding history of primary mitral regurgitation with reduced ejection fraction as well as Grave's disease.  He had had progressive heart failure as an outpatient.  He was admitted in February 2025 for progressive shortness of breath.  He was found to have a small pulmonary embolism without significant right heart strain.  He was given fluids, nodal blockade for A-fib with RVR and subsequently became altered, hypoxic,  with ultimate cardiac arrest.   ECMO team was consulted and patient was emergently taken to [facility] for ECMO cannulation with Impella CP drainage.  He was upgraded to a 5.5 and given his severe mitral regurgitation was arranged to have MitraClip performed.  Following MitraClip he was able to come off hemodynamic support.  Course complicated by acute renal failure requiring dialysis that slowly improved over the course of his admission.  He went to CIR and was discharged without hemodialysis catheter"  PRECAUTIONS: Fall  WEIGHT BEARING RESTRICTIONS: No  PAIN: Spasms in leg and where he has incisions - "discomfort" - both groins, both sides of neck and in back behind his heart and up to his neck.  Are you having pain? Yes: NPRS scale: 4/10 Pain location: incisions Pain description: sore Aggravating factors: unknown Relieving factors: rest  FALLS: Has patient fallen in last 6 months? No  LIVING ENVIRONMENT: Family/patient expects to be discharged to:: Private residence Living Arrangements: Spouse/significant other Available Help at Discharge: Family;Available 24 hours/day Type of Home: House Home Access: Level entry Home Layout: One level Bathroom Shower/Tub: Engineer, manufacturing systems: Standard Home Equipment: None  PLOF: Independent;Working/employed;Driving; works as a Paediatric nurse   PATIENT GOALS: return to work/PLOF  OBJECTIVE:  Note: Objective measures were completed at Evaluation unless otherwise noted.  HAND DOMINANCE: Right  ADLs: Overall ADLs: mod I  IADLs: Shopping: mod A Light housekeeping: mod I Meal Prep: mod I Community mobility: dependent Medication management: mod I Financial management: mod I Handwriting: 90% legible; has not tried typing  MOBILITY STATUS: Independent  ACTIVITY TOLERANCE: Activity tolerance: fair  FUNCTIONAL OUTCOME MEASURES: PSFS: 5.7 total score  Total score = sum of the activity scores/number of activities Minimum  detectable change (90%CI) for average score = 2 points Minimum detectable change (90%CI) for single activity score = 3 points   UPPER EXTREMITY ROM and MMT:    BUE WNL though poor endurance  HAND FUNCTION: Grip strength: Right: 113.7 lbs; Left: 116.8 lbs  COORDINATION: 9 Hole Peg test: Right: 25 sec; Left: 35 sec  SENSATION: Paresthesias reported B  EDEMA: none reported or observed  MUSCLE TONE:BUE WFL  COGNITION: Overall cognitive status: Within functional limits for tasks assessed  VISION: Subjective report: some recent episodes of blurry/diplopia that has resolved Baseline vision: No visual deficits Visual history: none  VISION ASSESSMENT: WFL  PERCEPTION: WFL  PRAXIS: WFL  OBSERVATIONS: Mild B tremor. Appears well-kept. No AD for ambulation, no LOB.                                                                                                                            TODAY'S TREATMENT:   OT initiated RUE and LUE blue Thera-Band HEP including shoulder flexion, horizontal abd/add, bicep curl, and tricep extension to promote strengthening of affected extremity and overall endurance as noted in pt instructions.    OT educated patient on the 4 Ps of energy conservation including positioning, pace, prioritizing, and planning to maximize efficiency and safety with completion of functional activities as desired.  Patient verbalized understanding of all information provided and was given a packet to take home for review. OT educated patient in Safety considerations for loss of sensation as noted in patient instructions to reduce risk of injury to affected hand.  Pt encouraged to be careful of sharp, hot/cold (check temperatures of water), breakable, heavy objects and chemicals. Patient verbalized understanding. Handout provided.    PATIENT EDUCATION: Education details: Engineer, technical sales and sensory precautions Person educated: Patient and fiance Education method:  Explanation, Demonstration, Tactile cues, Verbal cues, and Handouts Education comprehension: verbalized understanding, returned demonstration, verbal cues required, tactile cues required, and needs further education  HOME EXERCISE PROGRAM: 09/09/23: Coordination Activities 09/16/23: Putty Activities Access Code: [email or web address] 09/23/2023: visual scanning 09/30/2023: EC, TB, sensory precautions  GOALS:  SHORT TERM GOALS: Target date: 09/29/2023  Patient will demonstrate initial BUE HEP with 25% verbal cues or less for proper execution. Baseline: New to outpt OT  Goal status: IN Progress  2.  Patient will return demonstration with use of DME as needed for tremor reduction.  Baseline: New to outpt OT  Goal status: IN Progress  3.  Patient will independently verbalize at least 3 energy conservation principles in relation to ADLs to increase functional independence. Baseline: New to outpt OT  Goal status: INITIAL  4.  Patient will independently recall at least 2 vision strategies without cueing. Baseline: New to outpt OT  Goal status: INITIAL  5.  Pt will independently recall the 5 main sensory precautions (cold, heat, sharp, chemical, and heavy) as needed to prevent injury/harm secondary to impairments.  Baseline: New to outpt OT Goal Status: INITIAL   LONG TERM GOALS: Target date: 10/24/2023   Patient will demonstrate updated BUE HEP with visual handouts only for proper execution. Baseline: New to outpt OT  Goal status: INITIAL  2.  Patient will report at least two-point increase in average PSFS score or at least three-point increase in a single activity score indicating functionally significant improvement given minimum detectable change.  Baseline: 5.7 total score (See above for individual activity scores) Goal status: INITIAL  ASSESSMENT:  CLINICAL IMPRESSION: Patient demonstrates good understanding of HEP and strategies as needed to progress towards goals. Anticipate early  discharge.  PERFORMANCE DEFICITS: in functional skills including ADLs, IADLs, coordination, dexterity, Fine motor control, endurance, decreased knowledge of precautions, decreased knowledge of use of DME, vision, and UE functional use.   IMPAIRMENTS: are limiting patient from ADLs, IADLs, rest and sleep, work, leisure, and social participation.   CO-MORBIDITIES: may have co-morbidities  that affects occupational performance. Patient will benefit from skilled OT to address above impairments and improve overall function.  REHAB POTENTIAL: Good  PLAN:  OT FREQUENCY: 2x/week  OT DURATION: 6 weeks  PLANNED INTERVENTIONS: 97168 OT Re-evaluation, 97535 self care/ADL training, 14782 therapeutic exercise, 97530 therapeutic activity, 97112 neuromuscular re-education, 97140 manual therapy, 97035 ultrasound, 97018 paraffin, 95621 fluidotherapy, 97032 electrical stimulation (manual), 97760 Orthotic Initial, [id number] Orthotic/Prosthetic subsequent, functional mobility training, visual/perceptual remediation/compensation, energy conservation, coping strategies training, patient/family education, and DME and/or AE instructions  RECOMMENDED OTHER SERVICES: N/A for this visit  CONSULTED AND AGREED WITH PLAN OF CARE: Patient  PLAN FOR NEXT SESSIONS: monitor BP Review: Theraband HEP (endurance) Sensory precautions Energy Conservation strategies  Tremor strategies (intro 4/29) Coordination HEP (handout 4/29) Putty (5/6) vs desensitization strategies  [email or web address] Manual/cupping trialled  Altamease Asters, OT 09/30/2023, 4:29 PM

## 2023-09-30 NOTE — Patient Instructions (Addendum)
 Upper Body Strengthening Exercises Comments Sit upright, away from the back of your chair. Keep abdominal muscles engaged i.e. pull belly button to spine.  FOR ALL EXERCISES: Repeat 10 Times  Hold 3 Seconds  Complete 1 Set  Perform 2 Times a Day  ELASTIC BAND FLEXION   Place unaffected arm on your leg or hip. With your affected arm, hold elastic band in front of you and pull the band upward towards the ceiling as shown.    ELASTIC BAND HORIZONTAL ABDUCTION - SCAPULAR RETRACTION  Start by holding elastic band in front of your chest with your elbows straight. Then, pull your arms apart and towards the side while squeezing your shoulder blades together. Return to starting position and repeat.             ELASTIC BAND TRICEPS EXTENSION  While seated, hold and fixate one end of an elastic band against your chest. Hold the other end with your opposite hand with your elbow bent and arm by your side.   Start by pulling the band downward so that the elbow goes from a bent position to a straightened position as shown. Return to starting position and repeat.   BICEPS CURL WITH BAND  While sitting in an upright position, put an elastic band underneath your feet (as pictured)  OR underneath your thighs. Grip each end of the band, keep the elbows tucked to the body, and bring hands to shoulders by bending at the elbows.      1    Energy Conservation  What is Printmaker?  After being in the hospital, it is normal to feel tired and weak. You may also feel short of breath and have less energy to do the activities you are used to doing at home. Learning how to conserve your energy helps you build up your strength to take part in your daily activities and other things you enjoy doing.  When you learn to conserve energy, you also reduce strain on your heart, fatigue, shortness of breath and stress related pain.  Learning to conserve your energy is all about finding a good balance between  work, rest and leisure in order to decrease the amount of energy demand on your body.   Remember and Practice the 4 Ps  1. Prioritize  Decide what needs to be done today and what can be done at a later date or time. For example, going to a doctor's appointment would take priority over dusting the living room.  When you have more than one thing to do, begin with the most important to make sure it gets done.   2. Plan  Plan your activities first to avoid extra trips. Gather the supplies and  equipment you need before doing the job. For example, get your  garden supplies and tools ready before you start to plant flowers.  Plan to alternate heavy and light tasks.  Plan activities throughout the week to avoid doing too many activities in one day. Put a schedule on the refrigerator to remind you and others who is doing what.  Plan to get a good rest each night.  Use family and friends or pay for help to complete tasks you may struggle with or that require too much energy.   3. Pace  Maintain a slow and steady pace. Never rush.  2   3. Pace (continued)  Rest often. Rest before you feel tired.  Avoid holding your breath. Practice breathing slow and steady.  Use pursed  lip breathing. Breathe in through your nose for a count of 2 and out from your mouth for a count of 4. This is like blowing out a candle on a cake.  Remember that you may have to ask for help to do some tasks and that is okay.  Listen to your body and know your limits.   4. Position  Too much bending and reaching can cause fatigue and shortness of breath. Use a reacher, sock aid, long handled shoe horn and/or  elastic shoelaces. Avoid bending and reaching too much.  Always maintain a nice upright posture when sitting and  standing. This helps you get more oxygen into your lungs and  around your body to work better.  Sit when you can. Sitting supports your body so you can focus  on your breathing and activities while conserving  your energy.  Sitting reduces energy use by 25%.   Energy Conservation Tips  Dressing and Hygiene  Sit when you can.  Organize and lay out clothing the night before.  Begin dressing your lower half first as this uses more energy.  Avoid bending and reaching. Instead, use a reacher, sock aid  or long handled shoe horn or lift your legs up onto the bed or chair.  Dry off with terry cloth robe. You use less energy than drying off with a towel.  If you have a weaker limb or limbs, it is easier to dress the weaker limb first. It is easier to undress your strong limb first.  Wear clothes that are easy to put on and take off. For example, use clothes and shoes with velcro instead of small buttons, clasps or laces.  3    Avoid using scented products such as hair products and lotions. These can irritate your lungs and cause shortness of breath for you and others around you. Many people are allergic to scents. These types of products are not allowed in the hospital.  Be cautious when bathing. Use warm, not hot water. This helps eliminate shortness of breath from a buildup of steam and condensation.  Use the bathroom equipment suggested by your Occupational Therapist. For example using a bath bench, bath stool, grab bars or a raised toilet seat can make bathing and toileting easier and safer.   Shopping  Bring a prepared list of things you need to buy.  Organize your shopping list by aisle or section of the  store.  Transport items in a buggy or shopping cart rather than  carrying them in a basket.  Load and carry grocery bags that are only half full or shop with someone who can help pack and carry bags.  Avoid going out during rush hour when stores and streets are crowded.  Consider using a delivery service.   Housework  Divide activities and do them throughout the week. Balance light with heavy tasks.  Make one side of the bed at a time. Sit to change pillow cases and unfold linen.  Avoid  spray cleaners that may irritate your lungs.  Clean the bathtub by sitting or kneeling.  Clean one whole room at a time instead of going back and forth between rooms to do each job.  Consider asking for help from family members or  hiring a cleaning service or housekeeper.  After washing dishes, allow them to air dry.  Have work in front of you rather than at your side.  Slide rather than lift objects.  Use long handled dustpans and cleaning sponges  to decrease the need for bending.  4    Make a weekly plan for major jobs such as laundry, cleaning and  changing sheets on beds. Do one job each day.  Keep a trash can in every room to avoid too much walking.  Buy more than one of each item you use around the house.  For example, keep sink cleaner in the bathroom and kitchen.  Keep a vacuum on each level of your home.   Cooking  Cook and bake in steps to reduce energy use.  Gather all ingredients and utensils before starting.  Plan ahead with meal preparation.  Make large meals and freeze in servings for later use.  Use lightweight cookware and dishes to conserve energy.  Use paper plates and cups to eliminate dishwashing.  Use electric appliances such as can openers, blenders, food processors and dishwasher to conserve energy.  Consider buying easy to prepare or frozen meals, or using a meal delivery service.   Key Points:  Prioritize activities of the day. Do heavier tasks when you have more energy.  Plan your days' and weeks' activities. Set up your work area so you do not have to move around a lot looking for items to complete the task. Plan rest times.  Pace yourself. Do not try to complete the whole task in one session. Break it into smaller, easy to do steps. A good guide to follow is to take 10 minutes each hour to rest. Do not rush.  Position and Posture are important. Sit to work when you can to use 25% less energy. Sit and stand as upright as you can. Practice deep breathing  exercises while you work to maintain your breathing rate and stay relaxed.  Use assistive devices when recommended to save energy and make it more comfortable and easy taking care of yourself.   Remember.  The most important energy conservation tip is to listen to your body.  Stop and rest BEFORE you get tired. Plan rest times. Rest often.    Safety considerations for loss of sensation:   Look at affected hand when using it!   Do NOT use affected arm for anything: sharp, hot, breakable, or too heavy  Always check temperature of water (for showering, washing dishes, etc) with UNaffected arm/extremity  Consider travel mugs w/ lids to transport hot liquids/coffee  Consider alternative options and/or adaptive equipment to make things safer (ex: hand chopper or cut resistant glove for chopping vegetables)   Avoid cold temperatures as well (wear glove in cold temperatures, get ice w/ unaffected extremity)  AVOID handling chemicals and machinery

## 2023-10-07 ENCOUNTER — Ambulatory Visit: Admitting: Occupational Therapy

## 2023-10-07 DIAGNOSIS — R208 Other disturbances of skin sensation: Secondary | ICD-10-CM | POA: Diagnosis not present

## 2023-10-07 DIAGNOSIS — R29898 Other symptoms and signs involving the musculoskeletal system: Secondary | ICD-10-CM

## 2023-10-07 DIAGNOSIS — R278 Other lack of coordination: Secondary | ICD-10-CM

## 2023-10-07 DIAGNOSIS — R269 Unspecified abnormalities of gait and mobility: Secondary | ICD-10-CM | POA: Diagnosis not present

## 2023-10-07 DIAGNOSIS — R29818 Other symptoms and signs involving the nervous system: Secondary | ICD-10-CM

## 2023-10-07 DIAGNOSIS — R251 Tremor, unspecified: Secondary | ICD-10-CM | POA: Diagnosis not present

## 2023-10-07 DIAGNOSIS — R41842 Visuospatial deficit: Secondary | ICD-10-CM | POA: Diagnosis not present

## 2023-10-07 NOTE — Therapy (Signed)
 OUTPATIENT OCCUPATIONAL THERAPY NEURO TREATMENT AND DISCHARGE  Patient Name: Aaron [name] MRN: [id number] DOB:July 05, 1985, 38 y.o., male Today's Date: 10/07/2023  OCCUPATIONAL THERAPY DISCHARGE SUMMARY  Visits from Start of Care: 6  Current functional level related to goals / functional outcomes: Patient has met 5/5 short-term goals and 2/2 long-term goals to date.   Remaining deficits: Pt remains limited by endurance though remains followed by cardiology.    Education / Equipment: Continue with HEPs and energy conservation strategies; follow-up with cardiology   Patient agrees to discharge. Patient goals were met. Patient is being discharged due to maximized rehab potential. .  PCP: No PCP per pt  REFERRING PROVIDER: Zelda Hickman, PA-C   END OF SESSION:  OT End of Session - 10/07/23 1621     Visit Number 6    Number of Visits 13    Date for OT Re-Evaluation 10/24/23    Authorization Type [place] Medicaid    OT Start Time 1621    OT Stop Time 1700    OT Time Calculation (min) 39 min    Equipment Utilized During Treatment Blue putty, cups    Activity Tolerance Patient tolerated treatment well    Behavior During Therapy WFL for tasks assessed/performed            Past Medical History:  Diagnosis Date   Atrial fibrillation (HCC)    Hyperthyroidism    Mitral regurgitation    NSTEMI (non-ST elevated myocardial infarction) [facility])    Past Surgical History:  Procedure Laterality Date   ECMO CANNULATION N/A 07/10/2023   Procedure: ECMO CANNULATION;  Surgeon: Mardell Shade, MD;  Location: [facility] INVASIVE CV LAB;  Service: Cardiovascular;  Laterality: N/A;   EMBOLECTOMY Left 07/18/2023   Procedure: EMBOLECTOMY Left Femoral, Popliteal and iliac arterys,  Repair Left common femoral artery.;  Surgeon: Kayla Part, MD;  Location: [facility] OR;  Service: Vascular;  Laterality: Left;   INTRAOPERATIVE TRANSESOPHAGEAL ECHOCARDIOGRAM N/A 07/23/2023   Procedure: ECHOCARDIOGRAM,  TRANSESOPHAGEAL, INTRAOPERATIVE;  Surgeon: Zelphia Higashi, MD;  Location: [facility] OR;  Service: Open Heart Surgery;  Laterality: N/A;   IR FLUORO GUIDE CV LINE RIGHT  08/08/2023   IR REMOVAL TUN CV CATH W/O FL  08/14/2023   IR US  GUIDE VASC ACCESS RIGHT  08/08/2023   LEFT HEART CATH AND CORONARY ANGIOGRAPHY N/A 10/22/2021   Procedure: LEFT HEART CATH AND CORONARY ANGIOGRAPHY;  Surgeon: Sammy Crisp, MD;  Location: [facility] INVASIVE CV LAB;  Service: Cardiovascular;  Laterality: N/A;   PLACEMENT OF IMPELLA LEFT VENTRICULAR ASSIST DEVICE Right 07/14/2023   Procedure: PLACEMENT OF RIGHT AXILLARY IMPELLA 5.5 LEFT VENTRICULAR ASSIST DEVICE;  Surgeon: Zelphia Higashi, MD;  Location: [facility] OR;  Service: Open Heart Surgery;  Laterality: Right;   REMOVAL OF IMPELLA LEFT VENTRICULAR ASSIST DEVICE Right 07/14/2023   Procedure: REMOVAL OF CP RIGHT FEMORAL IMPELLA LEFT VENTRICULAR ASSIST DEVICE;  Surgeon: Zelphia Higashi, MD;  Location: [facility] OR;  Service: Open Heart Surgery;  Laterality: Right;   REMOVAL OF IMPELLA LEFT VENTRICULAR ASSIST DEVICE N/A 07/23/2023   Procedure: REMOVAL, CARDIAC ASSIST DEVICE, IMPELLA;  Surgeon: Zelphia Higashi, MD;  Location: [facility] OR;  Service: Open Heart Surgery;  Laterality: N/A;   TRANSCATHETER MITRAL EDGE TO EDGE REPAIR N/A 07/16/2023   Procedure: TRANSCATHETER MITRAL EDGE TO EDGE REPAIR;  Surgeon: Arnoldo Lapping, MD;  Location: [facility] INVASIVE CV LAB;  Service: Cardiovascular;  Laterality: N/A;   TRANSESOPHAGEAL ECHOCARDIOGRAM (CATH LAB) N/A 07/16/2023   Procedure: TRANSESOPHAGEAL ECHOCARDIOGRAM;  Surgeon:  Arnoldo Lapping, MD;  Location: [facility] INVASIVE CV LAB;  Service: Cardiovascular;  Laterality: N/A;   VENTRICULAR ASSIST DEVICE INSERTION N/A 07/10/2023   Procedure: VENTRICULAR ASSIST DEVICE INSERTION;  Surgeon: Mardell Shade, MD;  Location: [facility] INVASIVE CV LAB;  Service: Cardiovascular;  Laterality: N/A;   Patient Active Problem List   Diagnosis Date Noted   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) 08/11/2023   AKI (acute kidney injury) (HCC) 08/08/2023   Normocytic anemia 08/05/2023   Nausea 08/05/2023   Insomnia 08/05/2023   Atrial fibrillation (HCC) 08/05/2023   Malnutrition of moderate degree 08/05/2023   Debility 08/04/2023   Protein-calorie malnutrition, severe 07/23/2023   Palliative care by specialist 07/21/2023   Cardiac arrest (HCC) 07/10/2023   Cardiogenic shock (HCC) 07/10/2023   Hyperkalemia 07/10/2023   On mechanically assisted ventilation (HCC) 07/10/2023   Acute respiratory failure with hypoxia (HCC) 07/10/2023   Bilateral pulmonary embolism (HCC) 07/09/2023   Chronic heart failure with preserved ejection fraction (HFpEF) (HCC) 07/09/2023   Severe mitral regurgitation 07/09/2023   Paroxysmal atrial fibrillation (HCC) 07/09/2023   Atrial fibrillation with rapid ventricular response (HCC) 03/20/2023   Chest pain 03/30/2022   Essential hypertension 03/30/2022   Hypomagnesemia 03/30/2022   Hyperthyroidism 10/23/2021   Elevated troponin 10/19/2021   NSTEMI (non-ST elevated myocardial infarction) (HCC) 10/19/2021    ONSET DATE: 08/13/2023 (referral date)  REFERRING DIAG: I63.9: Cerebral infarction, unspecified   THERAPY DIAG:  Other lack of coordination  Other symptoms and signs involving the nervous system  Other disturbances of skin sensation  Other symptoms and signs involving the musculoskeletal system  Rationale for Evaluation and Treatment: Rehabilitation  SUBJECTIVE:   SUBJECTIVE STATEMENT:  He has been having a lot of back pain, some neck pain, and some upper extremity numbness. He reports occasional chest pain but not necessarily at the same time. Pt denies currently experiencing any of these symptoms except for back pain. Pt has not been monitoring BP at home. Fiance reports they can use her mom's monitor. No follow-up from endocrinology.   Pt accompanied by: self and [name],  fiance  PERTINENT HISTORY: chronic systolic heart failure, mitral valve disease, CKD, Grave's disease, atrial fibrillation  Per 08/20/23 MD Progress Notes: "Patient with a longstanding history of primary mitral regurgitation with reduced ejection fraction as well as Grave's disease.  He had had progressive heart failure as an outpatient.  He was admitted in February 2025 for progressive shortness of breath.  He was found to have a small pulmonary embolism without significant right heart strain.  He was given fluids, nodal blockade for A-fib with RVR and subsequently became altered, hypoxic, with ultimate cardiac arrest.   ECMO team was consulted and patient was emergently taken to [facility] for ECMO cannulation with Impella CP drainage.  He was upgraded to a 5.5 and given his severe mitral regurgitation was arranged to have MitraClip performed.  Following MitraClip he was able to come off hemodynamic support.  Course complicated by acute renal failure requiring dialysis that slowly improved over the course of his admission.  He went to CIR and was discharged without hemodialysis catheter"  PRECAUTIONS: Fall  WEIGHT BEARING RESTRICTIONS: No  PAIN: Spasms in leg and where he has incisions - "discomfort" - both groins, both sides of neck and in back behind his heart and up to his neck.  Are you having pain? Yes: NPRS scale: 4/10 Pain location: incisions Pain description: sore Aggravating factors: unknown Relieving factors: rest  FALLS: Has patient fallen in last 6 months? No  LIVING ENVIRONMENT: Family/patient expects to be discharged to:: Private residence Living Arrangements: Spouse/significant other Available Help at Discharge: Family;Available 24 hours/day Type of Home: House Home Access: Level entry Home Layout: One level Bathroom Shower/Tub: Engineer, manufacturing systems: Standard Home Equipment: None  PLOF: Independent;Working/employed;Driving; works as a Paediatric nurse   PATIENT  GOALS: return to work/PLOF  OBJECTIVE:  Note: Objective measures were completed at Evaluation unless otherwise noted.  HAND DOMINANCE: Right  ADLs: Overall ADLs: mod I  IADLs: Shopping: mod A Light housekeeping: mod I Meal Prep: mod I Community mobility: dependent Medication management: mod I Financial management: mod I Handwriting: 90% legible; has not tried typing  MOBILITY STATUS: Independent  ACTIVITY TOLERANCE: Activity tolerance: fair  FUNCTIONAL OUTCOME MEASURES: PSFS: 5.7 total score   10/07/2023 PSFS: 7 total score   Total score = sum of the activity scores/number of activities Minimum detectable change (90%CI) for average score = 2 points Minimum detectable change (90%CI) for single activity score = 3 points   UPPER EXTREMITY ROM and MMT:    BUE WNL though poor endurance  HAND FUNCTION: Grip strength: Right: 113.7 lbs; Left: 116.8 lbs  COORDINATION: 9 Hole Peg test: Right: 25 sec; Left: 35 sec  SENSATION: Paresthesias reported B  EDEMA: none reported or observed  MUSCLE TONE:BUE WFL  COGNITION: Overall cognitive status: Within functional limits for tasks assessed  VISION: Subjective report: some recent episodes of blurry/diplopia that has resolved Baseline vision: No visual deficits Visual history: none  VISION ASSESSMENT: WFL  PERCEPTION: WFL  PRAXIS: WFL  OBSERVATIONS: Mild B tremor. Appears well-kept. No AD for ambulation, no LOB.                                                                                                                            TODAY'S TREATMENT:   Therapist reviewed goals with patient and updated patient progression.  No additional functional limitations identified. Objective measures assessed as noted in Goals section to determine progression towards goals. OT strongly encouraged pt to take BP reading BID. Chart provided. Pt has been asked to provide these at next cardiology appointment.  Reviewed pt  status with establishing PCP. He and his fiance were encouraged to get on a waiting list as he is not scheduled to be seen until the end of July.  OT reviewed symptoms of mitral valve regurgitation/low ejection fraction/heart attack. Pt was advised to seek immediate medical attention should he experience pain or numbness with either chest pain, shortness of breath, or increased sweat production.  OT reviewed reducing bending/bringing head below heart, which will likely cause him to be symptomatic. Instead, pt was encouraged to squat, sit and bring item closer to him, or use a reacher to improve safety and reduce overall stress on the cardiac system.   PATIENT EDUCATION: Education details: Goal progression; medical management; d/c Person educated: Patient and fiance Education method: Explanation, Demonstration, and Verbal cues  Education comprehension: verbalized understanding and returned demonstration  HOME EXERCISE PROGRAM: 09/09/23: Coordination Activities 09/16/23: Putty Activities Access Code: [email or web address] 09/23/2023: visual scanning 09/30/2023: EC, TB, sensory precautions  GOALS:  SHORT TERM GOALS: Target date: 09/29/2023  Patient will demonstrate initial BUE HEP with 25% verbal cues or less for proper execution. Baseline: New to outpt OT  Goal status: MET  2.  Patient will return demonstration with use of DME as needed for tremor reduction.  Baseline: New to outpt OT  Goal status: MET  3.  Patient will independently verbalize at least 3 energy conservation principles in relation to ADLs to increase functional independence. Baseline: New to outpt OT  Goal status: MET  4.  Patient will independently recall at least 2 vision strategies without cueing. Baseline: New to outpt OT  Goal status: MET  5.  Pt will independently recall the 5 main sensory precautions (cold, heat, sharp, chemical, and heavy) as needed to prevent injury/harm secondary to impairments.  Baseline: New to outpt  OT Goal Status: MET  LONG TERM GOALS: Target date: 10/24/2023   Patient will demonstrate updated BUE HEP with visual handouts only for proper execution. Baseline: New to outpt OT  Goal status: MET  2.  Patient will report at least two-point increase in average PSFS score or at least three-point increase in a single activity score indicating functionally significant improvement given minimum detectable change.  Baseline: 5.7 total score (See above for individual activity scores) Goal status: MET  ASSESSMENT:  CLINICAL IMPRESSION: Patient is appropriate for discharge and no longer demonstrates medical necessity for continued skilled occupational therapy services. Based on his reported symptoms though, pt should monitor his symptoms more closely and seek emergent care as needed. His symptoms do seem to be related to his heart conditions, which makes his follow-up appointment extremely important to attend. Pt and fiance seem to understand education provided today.  PLAN:  OT D/C Completed  CONSULTED AND AGREED WITH PLAN OF CARE: Patient and fiance  Altamease Asters, OT 10/07/2023, 5:18 PM

## 2023-10-14 ENCOUNTER — Encounter: Admitting: Occupational Therapy

## 2023-10-15 ENCOUNTER — Encounter (HOSPITAL_COMMUNITY): Admitting: Cardiology

## 2023-10-20 DIAGNOSIS — N189 Chronic kidney disease, unspecified: Secondary | ICD-10-CM | POA: Diagnosis not present

## 2023-10-20 DIAGNOSIS — I129 Hypertensive chronic kidney disease with stage 1 through stage 4 chronic kidney disease, or unspecified chronic kidney disease: Secondary | ICD-10-CM | POA: Diagnosis not present

## 2023-10-20 DIAGNOSIS — N179 Acute kidney failure, unspecified: Secondary | ICD-10-CM | POA: Diagnosis not present

## 2023-10-20 DIAGNOSIS — I5020 Unspecified systolic (congestive) heart failure: Secondary | ICD-10-CM | POA: Diagnosis not present

## 2023-10-20 DIAGNOSIS — I480 Paroxysmal atrial fibrillation: Secondary | ICD-10-CM | POA: Diagnosis not present

## 2023-10-20 DIAGNOSIS — E0500 Thyrotoxicosis with diffuse goiter without thyrotoxic crisis or storm: Secondary | ICD-10-CM | POA: Diagnosis not present

## 2023-11-07 ENCOUNTER — Telehealth (HOSPITAL_COMMUNITY): Payer: Self-pay | Admitting: Cardiology

## 2023-11-07 NOTE — Telephone Encounter (Signed)
 Called to confirm/remind patient of their appointment at the [facility] on 11/07/23.   Appointment:   [x] Confirmed  [] Left mess   [] No answer/No voice mail  [] VM Full/unable to leave message  [] Phone not in service  Patient reminded to bring all medications and/or complete list.  Confirmed patient has transportation. Gave directions, instructed to utilize valet parking.

## 2023-11-09 ENCOUNTER — Telehealth (HOSPITAL_COMMUNITY): Payer: Self-pay

## 2023-11-09 NOTE — Telephone Encounter (Signed)
 Left message for patient to call office and reschedule his appointment.

## 2023-11-10 ENCOUNTER — Encounter (HOSPITAL_COMMUNITY): Admitting: Cardiology

## 2023-12-08 ENCOUNTER — Telehealth (HOSPITAL_COMMUNITY): Payer: Self-pay | Admitting: Cardiology

## 2023-12-08 NOTE — Telephone Encounter (Signed)
 Called to confirm/remind patient of their appointment at the [facility] on 12/08/23.   Appointment:   [] Confirmed  [] Left mess   [] No answer/No voice mail  [x] VM Full/unable to leave message  [] Phone not in service  Patient reminded to bring all medications and/or complete list.  Confirmed patient has transportation. Gave directions, instructed to utilize valet parking.

## 2023-12-09 ENCOUNTER — Ambulatory Visit (HOSPITAL_COMMUNITY)
Admission: RE | Admit: 2023-12-09 | Discharge: 2023-12-09 | Disposition: A | Discharge status: Home / Self Care | Source: Ambulatory Visit | Attending: Cardiology | Admitting: Cardiology

## 2023-12-09 ENCOUNTER — Encounter (HOSPITAL_COMMUNITY): Payer: Self-pay | Admitting: Cardiology

## 2023-12-09 VITALS — BP 150/90 | HR 65 | Ht 76.0 in | Wt 237.0 lb

## 2023-12-09 DIAGNOSIS — I5032 Chronic diastolic (congestive) heart failure: Secondary | ICD-10-CM | POA: Diagnosis not present

## 2023-12-09 DIAGNOSIS — I340 Nonrheumatic mitral (valve) insufficiency: Secondary | ICD-10-CM

## 2023-12-09 LAB — COMPREHENSIVE METABOLIC PANEL WITH GFR
ALT: 20 U/L (ref 0–44)
AST: 17 U/L (ref 15–41)
Albumin: 4 g/dL (ref 3.5–5.0)
Alkaline Phosphatase: 63 U/L (ref 38–126)
Anion gap: 8 (ref 5–15)
BUN: 6 mg/dL (ref 6–20)
CO2: 22 mmol/L (ref 22–32)
Calcium: 8.6 mg/dL — ABNORMAL LOW (ref 8.9–10.3)
Chloride: 111 mmol/L (ref 98–111)
Creatinine, Ser: 1.04 mg/dL (ref 0.61–1.24)
GFR, Estimated: >60 mL/min (ref >60)
Glucose, Bld: 78 mg/dL (ref 70–99)
Potassium: 3.8 mmol/L (ref 3.5–5.1)
Sodium: 141 mmol/L (ref 135–145)
Total Bilirubin: 0.9 mg/dL (ref 0.0–1.2)
Total Protein: 7.1 g/dL (ref 6.5–8.1)

## 2023-12-09 LAB — CBC
HCT: 35.3 % — ABNORMAL LOW (ref 39.0–52.0)
Hemoglobin: 12.1 g/dL — ABNORMAL LOW (ref 13.0–17.0)
MCH: 29.7 pg (ref 26.0–34.0)
MCHC: 34.3 g/dL (ref 30.0–36.0)
MCV: 86.5 fL (ref 80.0–100.0)
Platelets: 242 K/uL (ref 150–400)
RBC: 4.08 MIL/uL — ABNORMAL LOW (ref 4.22–5.81)
RDW: 13 % (ref 11.5–15.5)
WBC: 4.8 K/uL (ref 4.0–10.5)
nRBC: 0 % (ref 0.0–0.2)

## 2023-12-09 LAB — BRAIN NATRIURETIC PEPTIDE: B Natriuretic Peptide: 1490.6 pg/mL — ABNORMAL HIGH (ref 0.0–100.0)

## 2023-12-09 LAB — TSH: TSH: 0.015 u[IU]/mL — ABNORMAL LOW (ref 0.350–4.500)

## 2023-12-09 LAB — T4, FREE: Free T4: 1.44 ng/dL — ABNORMAL HIGH (ref 0.61–1.12)

## 2023-12-09 NOTE — Patient Instructions (Signed)
 There has been no changes to your medications.  Labs done today, your results will be available in MyChart, we will contact you for abnormal readings.  Your physician has requested that you have an echocardiogram. Echocardiography is a painless test that uses sound waves to create images of your heart. It provides your doctor with information about the size and shape of your heart and how well your heart's chambers and valves are working. This procedure takes approximately one hour. There are no restrictions for this procedure. Please do NOT wear cologne, perfume, aftershave, or lotions (deodorant is allowed). Please arrive 15 minutes prior to your appointment time.  Please note: We ask at that you not bring children with you during ultrasound (echo/ vascular) testing. Due to room size and safety concerns, children are not allowed in the ultrasound rooms during exams. Our front office staff cannot provide observation of children in our lobby area while testing is being conducted. An adult accompanying a patient to their appointment will only be allowed in the ultrasound room at the discretion of the ultrasound technician under special circumstances. We apologize for any inconvenience.  Your physician recommends that you schedule a follow-up appointment in: as scheduled   If you have any questions or concerns before your next appointment please send [place] a message through [place] or call our office at [phone].    TO LEAVE A MESSAGE FOR THE NURSE SELECT OPTION 2, PLEASE LEAVE A MESSAGE INCLUDING: YOUR NAME DATE OF BIRTH CALL BACK NUMBER REASON FOR CALL**this is important as we prioritize the call backs  YOU WILL RECEIVE A CALL BACK THE SAME DAY AS LONG AS YOU CALL BEFORE 4:00 PM  At the [facility], you and your health needs are our priority. As part of our continuing mission to provide you with exceptional heart care, we have created designated Provider Care Teams. These  Care Teams include your primary Cardiologist (physician) and Advanced Practice Providers (APPs- Physician Assistants and Nurse Practitioners) who all work together to provide you with the care you need, when you need it.   You may see any of the following providers on your designated Care Team at your next follow up: Dr Arvilla Meres Dr Marca Ancona Dr. Dorthula Nettles Dr. Clearnce Hasten Amy Filbert Schilder, NP Robbie Lis, [place] [facility] [place], [place] Brynda Peon, NP [place] Lee, NP Clarisa Kindred, NP Karle Plumber, PharmD Enos Fling, PharmD   Please be sure to bring in all your medications bottles to every appointment.    Thank you for choosing [facility]

## 2023-12-09 NOTE — Progress Notes (Addendum)
 ADVANCED HEART FAILURE FOLLOW UP CLINIC NOTE  Referring Physician: No ref. provider found  Primary Care: Paseda, Folashade R, FNP Primary Cardiologist:  HPI: Aaron [name] is a 38 y.o. male who presents for follow up of recent .      Patient with a longstanding history of primary mitral regurgitation with reduced ejection fraction as well as Grave's disease.  He had had progressive heart failure as an outpatient.  He was admitted in February 2025 for progressive shortness of breath.  He was found to have a small pulmonary embolism without significant right heart strain.  He was given fluids, nodal blockade for A-fib with RVR and subsequently became altered, hypoxic, with ultimate cardiac arrest.  ECMO team was consulted and patient was emergently taken to [facility] for ECMO cannulation with Impella CP drainage.  He was upgraded to a 5.5 and given his severe mitral regurgitation was arranged to have MitraClip performed.  Following MitraClip he was able to come off hemodynamic support.  Course complicated by acute renal failure requiring dialysis that slowly improved over the course of his admission.  He went to CIR and was discharged without hemodialysis catheter.    SUBJECTIVE:  Patient overall reports that he is not doing well.  He has gained about 30 pounds since his last visit, reports less energy, recent cough, cold intolerance, fatigue.  He has been taking his medications but is much less active than he was previously.  He has not been checking his blood pressure or heart rate at home.  His significant other reports that he also has been feeling more depressed recently.  They are not sure what medications he is taking recently.  PMH, current medications, allergies, social history, and family history reviewed in epic.  PHYSICAL EXAM: Vitals:   12/09/23 1550  BP: (!) 150/90  Pulse: 65  SpO2: 96%    GENERAL: NAD, well appearing PULM:  Normal work of breathing, CTAB CARDIAC:   JVP: mildly elevated         Normal rate with regular rhythm. No murmurs, rubs or gallops.  Trace edema. Warm and well perfused extremities. ABDOMEN: Soft, non-tender, non-distended. NEUROLOGIC: Patient is oriented x3 with no focal or lateralizing neurologic deficits.     DATA REVIEW  ECG: 08/20/23: NSR, rightward axis, normal QRS    ECHO: 03/2023: LVEF 45-50%, severe MR with bileaflet prolapse, myxomatous valve with flail segment 07/2023: LVEF 40-45%, mildly decreased function, LVH, normal RV function, mild MS with mean gradient 6, PVA 1.6cm2, 3 mitraclip in appropriate position  CATH: 10/2021: Normal coronary angiography  CMR:  10/23/2021: LVEF 49%, small apical transmural LGE consistent with scar   ReDs reading: 44 %, abnormal, lasix  as below    ASSESSMENT & PLAN:  Chronic systolic heart failure: Midrange ejection fraction, last EF 40-45% following mitraclip. Given his symptoms of shortness of breath, cough, and weight gain there is some concern that he is volume overloaded and his ejection fraction may have worsened since previously. -Decrease metoprolol  from 37 mg twice daily tartrate to 25 mg twice daily succinate -Start Entresto  24/26 mg twice daily -Start Lasix  40 mg daily -Echo ordered for next visit - Extremely close follow-up ordered  Mitral valve disease: Primary MR, s/p bailout mitraclip x3 after cardiac arrest. Mild MS noted on previous echocardiogram but with elevated HR. does not have a significant murmur on exam, low concern for clip failure but will obtain echocardiogram. - Serial echo, follow up in structural clinic  CKD: Labs ordered  today, creatinine now 1. - Previously on dialysis - Start Entresto  and Lasix  40 mg daily as above  Grave's disease: Long standing history, likely contributing to initial symptoms.  Interestingly symptoms were more concerning for hypothyroidism, but worsening TSH and rising free T4 and T3.  Patient desperately needs endocrinology  follow-up. - Continue methimazole  10 mg twice daily  Atrial fibrillation: Suspected in the setting of thyrotoxicosis and critical illness. Now back in NSR. Given small apical infarct suspect embolic disease history as well.  - Continue apixaban  5mg  BID   Pulmonary embolism:  - Small, noted during previous admission. - Continue OAC  I spent 60 minutes caring for this patient today including face to face time, ordering and reviewing labs, reviewing records from neprology, discussed care with significant other and med rec over the phone, setting close follow up , seeing the patient, documenting in the record, and arranging follow ups.   Morene Brownie, MD Advanced [facility] Failure Mechanical Circulatory Support 12/12/23

## 2023-12-10 ENCOUNTER — Ambulatory Visit (INDEPENDENT_AMBULATORY_CARE_PROVIDER_SITE_OTHER): Payer: Self-pay | Admitting: Nurse Practitioner

## 2023-12-10 ENCOUNTER — Encounter: Payer: Self-pay | Admitting: Nurse Practitioner

## 2023-12-10 ENCOUNTER — Telehealth (HOSPITAL_COMMUNITY): Payer: Self-pay | Admitting: Cardiology

## 2023-12-10 VITALS — BP 153/97 | HR 67 | Wt 233.0 lb

## 2023-12-10 DIAGNOSIS — F419 Anxiety disorder, unspecified: Secondary | ICD-10-CM

## 2023-12-10 DIAGNOSIS — N179 Acute kidney failure, unspecified: Secondary | ICD-10-CM

## 2023-12-10 DIAGNOSIS — I5032 Chronic diastolic (congestive) heart failure: Secondary | ICD-10-CM

## 2023-12-10 DIAGNOSIS — I10 Essential (primary) hypertension: Secondary | ICD-10-CM

## 2023-12-10 DIAGNOSIS — I340 Nonrheumatic mitral (valve) insufficiency: Secondary | ICD-10-CM

## 2023-12-10 DIAGNOSIS — I63549 Cerebral infarction due to unspecified occlusion or stenosis of unspecified cerebellar artery: Secondary | ICD-10-CM | POA: Diagnosis not present

## 2023-12-10 DIAGNOSIS — F1290 Cannabis use, unspecified, uncomplicated: Secondary | ICD-10-CM | POA: Insufficient documentation

## 2023-12-10 DIAGNOSIS — I2699 Other pulmonary embolism without acute cor pulmonale: Secondary | ICD-10-CM

## 2023-12-10 DIAGNOSIS — F32A Depression, unspecified: Secondary | ICD-10-CM | POA: Diagnosis not present

## 2023-12-10 DIAGNOSIS — E0590 Thyrotoxicosis, unspecified without thyrotoxic crisis or storm: Secondary | ICD-10-CM | POA: Diagnosis not present

## 2023-12-10 DIAGNOSIS — J9601 Acute respiratory failure with hypoxia: Secondary | ICD-10-CM

## 2023-12-10 DIAGNOSIS — I480 Paroxysmal atrial fibrillation: Secondary | ICD-10-CM | POA: Diagnosis not present

## 2023-12-10 NOTE — Assessment & Plan Note (Addendum)
 Continue spironolactone  25 mg daily, metoprolol  37.5 mg twice daily Maintain close follow-up with the HF team

## 2023-12-10 NOTE — Assessment & Plan Note (Signed)
 Continue metoprolol  37.5 mg twice daily, Eliquis  5 mg twice daily

## 2023-12-10 NOTE — Progress Notes (Signed)
 Established Patient Office Visit  Subjective:  Patient ID: Aaron [name], male    DOB: [birth year]  Age: 38 y.o. MRN: [id number]  CC:  Chief Complaint  Patient presents with   Establish Care    HPI Aaron [name] is a 38 y.o. 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  Patient presented establish care for his chronic medical conditions.  Has no previous PCP  He was on admission at the hospital from 07/09/2023 to 08/04/2023 for acute systolic CHF, bilateral PE , AKI, he was discharged to CIR afterwards, discharged from CIR on 08/14/2023. He is managed by the HF team, was on ECMO with impella,decannulated on 3/8 and Impella removed and extubated on 3/12-reintubated but eventually extubated again on 3/16 , currently on metoprolol  37.5 mg twice  daily, spironolactone  25 mg daily, saw the heart failure team yesterday, had labs done, they stated that they were told that they would get back to them regarding changes to his meds.  He reports ongoing fatigue, SOB , has a BP monitor at home but does not monitor BP.   AKI .At the hospital he required  CCRT , then HD , now established with nephrology, not requring HD at this time.   PAF, PE, Acute small right CVA On Eliquis  5mg  BID, not on a statin   Hyperthyroidism . Has upcoming appointment with endocrinology on 01/06/2024. Has methimazole  10mg  daily but he has been taking med BID.    He is accompanied by his fiance, patient was not interested in answering much questions today, stated that you are all asking the same questions , Im not interested in taking more medications.       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) 08/11/2023   Hyperthyroidism    Mitral regurgitation    NSTEMI (non-ST elevated myocardial infarction) [facility])     Past Surgical History:  Procedure Laterality Date   ECMO CANNULATION N/A 07/10/2023   Procedure: ECMO CANNULATION;  Surgeon: Cherrie Toribio SAUNDERS, MD;  Location: [facility] INVASIVE CV LAB;  Service: Cardiovascular;  Laterality: N/A;   EMBOLECTOMY Left 07/18/2023   Procedure: EMBOLECTOMY Left Femoral, Popliteal and iliac arterys,  Repair Left common femoral artery.;  Surgeon: Lanis Fonda BRAVO, MD;  Location: [facility] OR;  Service: Vascular;  Laterality: Left;   INTRAOPERATIVE TRANSESOPHAGEAL ECHOCARDIOGRAM N/A 07/23/2023   Procedure: ECHOCARDIOGRAM, TRANSESOPHAGEAL, INTRAOPERATIVE;  Surgeon: Kerrin Elspeth BROCKS, MD;  Location: [facility] OR;  Service: Open Heart Surgery;  Laterality: N/A;   IR FLUORO GUIDE CV LINE RIGHT  08/08/2023   IR REMOVAL TUN CV CATH W/O FL  08/14/2023   IR US  GUIDE VASC ACCESS RIGHT  08/08/2023   LEFT HEART CATH AND CORONARY ANGIOGRAPHY N/A 10/22/2021   Procedure: LEFT HEART CATH AND CORONARY ANGIOGRAPHY;  Surgeon: Mady Bruckner, MD;  Location: [facility] INVAS[facility] CV LAB;  Service: Cardiovascular;  Laterality: N/A;   PLACEMENT OF IMPELLA LEFT VENTRICULAR ASSIST DEVICE Right 07/14/2023   Procedure: PLACEMENT OF RIGHT AXILLARY IMPELLA 5.5 LEFT  VENTRICULAR ASSIST DEVICE;  Surgeon: Kerrin Elspeth BROCKS, MD;  Location: [facility] OR;  Service: Open Heart Surgery;  Laterality: Right;   REMOVAL OF IMPELLA LEFT VENTRICULAR ASSIST DEVICE Right 07/14/2023   Procedure: REMOVAL OF CP RIGHT FEMORAL IMPELLA LEFT VENTRICULAR ASSIST DEVICE;  Surgeon: Kerrin Elspeth BROCKS, MD;  Location: [facility] OR;  Service: Open Heart  Surgery;  Laterality: Right;   REMOVAL OF IMPELLA LEFT VENTRICULAR ASSIST DEVICE N/A 07/23/2023   Procedure: REMOVAL, CARDIAC ASSIST DEVICE, IMPELLA;  Surgeon: Kerrin Elspeth BROCKS, MD;  Location: [facility] OR;  Service: Open Heart Surgery;  Laterality: N/A;   TRANSCATHETER MITRAL EDGE TO EDGE REPAIR N/A 07/16/2023   Procedure: TRANSCATHETER MITRAL EDGE TO EDGE REPAIR;  Surgeon: Wonda Sharper, MD;  Location: [facility] INVASIVE CV LAB;  Service: Cardiovascular;  Laterality: N/A;   TRANSESOPHAGEAL ECHOCARDIOGRAM (CATH LAB) N/A 07/16/2023   Procedure: TRANSESOPHAGEAL ECHOCARDIOGRAM;  Surgeon: Wonda Sharper, MD;  Location: [facility] INVASIVE CV LAB;  Service: Cardiovascular;  Laterality: N/A;   VENTRICULAR ASSIST DEVICE INSERTION N/A 07/10/2023   Procedure: VENTRICULAR ASSIST DEVICE INSERTION;  Surgeon: Cherrie Toribio SAUNDERS, MD;  Location: [facility]VAS[facility] CV LAB;  Service: Cardiovascular;  Laterality: N/A;    Family History  Problem Relation Age of Onset   Heart disease Other     Social History   Socioeconomic History   Marital status: Single    Spouse name: Not on file   Number of children: Not on file   Years of education: Not on file   Highest education level: Not on file  Occupational History   Not on file  Tobacco Use   Smoking status: Never   Smokeless tobacco: Never  Vaping Use   Vaping status: Never Used  Substance and Sexual Activity   Alcohol use: No   Drug use: Yes    Frequency: 3.0 times per week    Types: Marijuana   Sexual activity: Never  Other Topics Concern   Not on file  Social History Narrative   Lives with fiance   Social Drivers of Health   Financial Resource Strain: Medium Risk (04/01/2023)   Overall Financial Resource Strain (CARDIA)    Difficulty of Paying Living Expenses: Somewhat hard  Food Insecurity: No Food Insecurity (07/10/2023)   Hunger Vital Sign    Worried About Running Out of Food in the Last Year: Never true    Ran Out of Food in the Last Year: Never true   Transportation Needs: No Transportation Needs (07/10/2023)   PRAPARE - Administrator, Civil Service (Medical): No    Lack of Transportation (Non-Medical): No  Physical Activity: Not on file  Stress: Not on file  Social Connections: Not on file  Intimate Partner Violence: Not At Risk (07/10/2023)   Humiliation, Afraid, Rape, and Kick questionnaire    Fear of Current or Ex-Partner: No    Emotionally Abused: No    Physically Abused: No    Sexually Abused: No    Outpatient Medications Prior to Visit  Medication Sig Dispense Refill   apixaban  (ELIQUIS ) 5 MG TABS tablet Take 1 tablet (5 mg total) by mouth 2 (two) times daily. 60 tablet 0   ascorbic acid  (VITAMIN C ) 500 MG tablet Take 0.5 tablets (250 mg total) by mouth 2 (two) times daily. 60 tablet 0   methimazole  (TAPAZOLE ) 10 MG tablet Take 1 tablet (10 mg total) by mouth daily. 30 tablet 0   Metoprolol  Tartrate 37.5 MG TABS Take 1 tablet by mouth in the morning and at  bedtime.     spironolactone  (ALDACTONE ) 25 MG tablet Take 25 mg by mouth daily.     metoprolol  succinate (TOPROL  XL) 25 MG 24 hr tablet Take 0.5 tablets (12.5 mg total) by mouth daily. (Patient not taking: Reported on 12/10/2023) 45 tablet 3   No facility-administered medications prior to visit.    No Known Allergies  ROS Review of Systems  Constitutional:  Positive for fatigue. Negative for fever.  Respiratory:  Positive for shortness of breath. Negative for cough and wheezing.   Cardiovascular:  Positive for leg swelling. Negative for chest pain and palpitations.  Gastrointestinal:  Negative for abdominal pain.  Neurological:  Positive for weakness.  Psychiatric/Behavioral:  Negative for behavioral problems, confusion, self-injury and suicidal ideas.       Objective:    Physical Exam Vitals and nursing note reviewed.  Constitutional:      General: He is not in acute distress.    Appearance: Normal appearance. He is obese. He is not ill-appearing,  toxic-appearing or diaphoretic.  Eyes:     General: No scleral icterus.       Right eye: No discharge.        Left eye: No discharge.     Extraocular Movements: Extraocular movements intact.     Conjunctiva/sclera: Conjunctivae normal.  Cardiovascular:     Rate and Rhythm: Normal rate and regular rhythm.     Pulses: Normal pulses.     Heart sounds: Normal heart sounds. No murmur heard.    No friction rub. No gallop.  Pulmonary:     Effort: Pulmonary effort is normal. No respiratory distress.     Breath sounds: Normal breath sounds. No stridor. No wheezing, rhonchi or rales.  Chest:     Chest wall: No tenderness.  Abdominal:     General: There is no distension.     Palpations: Abdomen is soft.     Tenderness: There is no abdominal tenderness. There is no right CVA tenderness, left CVA tenderness or guarding.  Musculoskeletal:        General: No swelling, tenderness, deformity or signs of injury.     Right lower leg: No edema.     Left lower leg: No edema.  Skin:    General: Skin is warm and dry.     Coloration: Skin is not jaundiced or pale.     Findings: No bruising, erythema or lesion.  Neurological:     Mental Status: He is alert and oriented to person, place, and time.  Psychiatric:        Mood and Affect: Mood is depressed. Affect is flat.        Speech: Speech is delayed.     BP (!) 153/97   Pulse 67   Wt 233 lb (105.7 kg)   SpO2 98%   BMI 28.36 kg/m  Wt Readings from Last 3 Encounters:  12/10/23 233 lb (105.7 kg)  12/09/23 237 lb (107.5 kg)  08/20/23 202 lb (91.6 kg)    Lab Results  Component Value Date   TSH 0.015 (L) 12/09/2023   Lab Results  Component Value Date   WBC 4.8 12/09/2023   HGB 12.1 (L) 12/09/2023   HCT 35.3 (L) 12/09/2023   MCV 86.5 12/09/2023   PLT 242 12/09/2023   Lab Results  Component Value Date   NA 141 12/09/2023   K 3.8 12/09/2023   CO2 22 12/09/2023   GLUCOSE 78 12/09/2023   BUN 6 12/09/2023   CREATININE 1.04 12/09/2023  BILITOT 0.9 12/09/2023   ALKPHOS 63 12/09/2023   AST 17 12/09/2023   ALT 20 12/09/2023   PROT 7.1 12/09/2023   ALBUMIN  4.0 12/09/2023   CALCIUM  8.6 (L) 12/09/2023   ANIONGAP 8 12/09/2023   Lab Results  Component Value Date   CHOL 103 10/20/2021   Lab Results  Component Value Date   HDL 33 (L) 10/20/2021   Lab Results  Component Value Date   LDLCALC 65 10/20/2021   Lab Results  Component Value Date   TRIG 115 07/27/2023   Lab Results  Component Value Date   CHOLHDL 3.1 10/20/2021   Lab Results  Component Value Date   HGBA1C 4.8 07/10/2023      Assessment & Plan:   Problem List Items Addressed This Visit       Cardiovascular and Mediastinum   Essential hypertension   BP Readings from Last 3 Encounters:  12/10/23 (!) 153/97  12/09/23 (!) 150/90  09/23/23 129/75  Currently uncontrolled Continue metoprolol  37.5 mg twice daily, spironolactone  25 mg daily, awaiting med changes recommendations  from the HF team  Encouraged to maintain close follow-up with the HF team       Bilateral pulmonary embolism (HCC)   Continue Eliquis  5 mg twice daily      Chronic heart failure with preserved ejection fraction (HFpEF) (HCC) - Primary   Continue spironolactone  25 mg daily, metoprolol  37.5 mg twice daily Maintain close follow-up with the HF team       Severe mitral regurgitation   Followed by cardiology Has upcoming echo       Paroxysmal atrial fibrillation (HCC)   Continue metoprolol  37.5 mg twice daily, Eliquis  5 mg twice daily      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)   Lab Results  Component Value Date   CHOL 103 10/20/2021   HDL 33 (L) 10/20/2021   LDLCALC 65 10/20/2021   TRIG 115 07/27/2023   CHOLHDL 3.1 10/20/2021  Not on a statin, check lipid panel at next visit        Respiratory   Acute respiratory failure with hypoxia (HCC)   Now resolved , currently on room air         Endocrine    Hyperthyroidism   Had labs done by cardiology yesterday Sees endocrinologist in August Taking methimazole  10 mg twice daily instead of daily        Genitourinary   AKI (acute kidney injury) [facility])   Lab Results  Component Value Date   NA 141 12/09/2023   K 3.8 12/09/2023   CO2 22 12/09/2023   GLUCOSE 78 12/09/2023   BUN 6 12/09/2023   CREATININE 1.04 12/09/2023   CALCIUM  8.6 (L) 12/09/2023   GFRNONAA >60 12/09/2023   Now resolved Sees nephrology again next year          Other   Anxiety and depression   Denies SI, HI He declined treatment at this time    12/10/2023    2:57 PM  GAD 7 : Generalized Anxiety Score  Nervous, Anxious, on Edge 3  Control/stop worrying 3  Worry too much - different things 3  Trouble relaxing 3  Restless 3  Easily annoyed or irritable 3  Afraid - awful might happen 3  Total GAD 7 Score 21  Anxiety Difficulty Extremely difficult        12/10/2023    2:55 PM  Depression screen PHQ 2/9  Decreased Interest 3  Down, Depressed, Hopeless  3  PHQ - 2 Score 6  Altered sleeping 3  Tired, decreased energy 3  Change in appetite 1  Feeling bad or failure about yourself  0  Trouble concentrating 0  Moving slowly or fidgety/restless 3  Suicidal thoughts 0  PHQ-9 Score 16  Difficult doing work/chores Extremely dIfficult         Marijuana smoker   Cessation encouraged        No orders of the defined types were placed in this encounter.   Follow-up: Return in about 3 months (around 03/11/2024).    Jesslynn Kruck R Chriss Mannan, FNP

## 2023-12-10 NOTE — Assessment & Plan Note (Deleted)
 Continue Eliquis  5 mg twice daily and metoprolol  37.5 mg twice daily

## 2023-12-10 NOTE — Assessment & Plan Note (Signed)
 Lab Results  Component Value Date   NA 141 12/09/2023   K 3.8 12/09/2023   CO2 22 12/09/2023   GLUCOSE 78 12/09/2023   BUN 6 12/09/2023   CREATININE 1.04 12/09/2023   CALCIUM  8.6 (L) 12/09/2023   GFRNONAA >60 12/09/2023   Now resolved Sees nephrology again next year

## 2023-12-10 NOTE — Assessment & Plan Note (Addendum)
 Followed by cardiology. Has upcoming echo.

## 2023-12-10 NOTE — Assessment & Plan Note (Signed)
 Lab Results  Component Value Date   CHOL 103 10/20/2021   HDL 33 (L) 10/20/2021   LDLCALC 65 10/20/2021   TRIG 115 07/27/2023   CHOLHDL 3.1 10/20/2021  Not on a statin, check lipid panel at next visit

## 2023-12-10 NOTE — Assessment & Plan Note (Signed)
 Continue Eliquis 5 mg twice daily

## 2023-12-10 NOTE — Patient Instructions (Addendum)
(  HEART FAILURE PATIENTS) Call MD:  Anytime you have any of the following symptoms: 1) 3 pound weight gain in 24 hours or 5 pounds in 1 week 2) shortness of breath, with or without a dry hacking cough 3) swelling in the hands, feet or stomach 4) if you have to sleep on extra pillows at night in order to      It is important that you exercise regularly at least 30 minutes 5 times a week as tolerated  Think about what you will eat, plan ahead. Choose  clean, green, fresh or frozen over canned, processed or packaged foods which are more sugary, salty and fatty. 70 to 75% of food eaten should be vegetables and fruit. Three meals at set times with snacks allowed between meals, but they must be fruit or vegetables. Aim to eat over a 12 hour period , example 7 am to 7 pm, and STOP after  your last meal of the day. Drink water,generally about per day or as directed by the cardiologist , no other drink is as healthy. Fruit juice is best enjoyed in a healthy way, by EATING the fruit.  Thanks for choosing [facility] we consider it a privelige to serve you.

## 2023-12-10 NOTE — Assessment & Plan Note (Signed)
 Had labs done by cardiology yesterday Sees endocrinologist in August Taking methimazole  10 mg twice daily instead of daily

## 2023-12-10 NOTE — Assessment & Plan Note (Signed)
 BP Readings from Last 3 Encounters:  12/10/23 (!) 153/97  12/09/23 (!) 150/90  09/23/23 129/75  Currently uncontrolled Continue metoprolol  37.5 mg twice daily, spironolactone  25 mg daily, awaiting med changes recommendations  from the HF team  Encouraged to maintain close follow-up with the HF team

## 2023-12-10 NOTE — Assessment & Plan Note (Signed)
 Denies SI, HI He declined treatment at this time    12/10/2023    2:57 PM  GAD 7 : Generalized Anxiety Score  Nervous, Anxious, on Edge 3  Control/stop worrying 3  Worry too much - different things 3  Trouble relaxing 3  Restless 3  Easily annoyed or irritable 3  Afraid - awful might happen 3  Total GAD 7 Score 21  Anxiety Difficulty Extremely difficult        12/10/2023    2:55 PM  Depression screen PHQ 2/9  Decreased Interest 3  Down, Depressed, Hopeless 3  PHQ - 2 Score 6  Altered sleeping 3  Tired, decreased energy 3  Change in appetite 1  Feeling bad or failure about yourself  0  Trouble concentrating 0  Moving slowly or fidgety/restless 3  Suicidal thoughts 0  PHQ-9 Score 16  Difficult doing work/chores Extremely dIfficult

## 2023-12-10 NOTE — Assessment & Plan Note (Signed)
 Now resolved , currently on room air

## 2023-12-10 NOTE — Progress Notes (Signed)
 ReDS Vest / Clip - 12/09/23 1550       ReDS Vest / Clip   Station Marker D    Ruler Value 41    ReDS Value Range High volume overload    ReDS Actual Value 44

## 2023-12-10 NOTE — Telephone Encounter (Signed)
 Patients care giver Grayce called to notify provider of home medications.  Vitamin C  500 mg BID Eliquis  5 mg BID Spironolactone  25 mg daily Metoprolol  Tartrate 37.5 mg BID Methimazole  10mg  BID   Please advise if changes are needed

## 2023-12-10 NOTE — Assessment & Plan Note (Signed)
 Cessation encouraged

## 2023-12-11 ENCOUNTER — Other Ambulatory Visit (HOSPITAL_COMMUNITY): Payer: Self-pay

## 2023-12-11 ENCOUNTER — Telehealth (HOSPITAL_COMMUNITY): Payer: Self-pay | Admitting: Pharmacy Technician

## 2023-12-11 LAB — T3, FREE: T3, Free: 5.3 pg/mL — ABNORMAL HIGH (ref 2.0–4.4)

## 2023-12-11 MED ORDER — METOPROLOL SUCCINATE ER 25 MG PO TB24: 25.0000 mg | ORAL_TABLET | Freq: Every day | ORAL | 3 refills | Status: DC

## 2023-12-11 MED ORDER — SACUBITRIL-VALSARTAN 24-26 MG PO TABS: 1.0000 | ORAL_TABLET | Freq: Two times a day (BID) | ORAL | 3 refills | Status: AC

## 2023-12-11 NOTE — Telephone Encounter (Signed)
 Pharmacy Patient Advocate Encounter  Insurance verification completed.   The patient is insured through [employer] [place] Medicaid   Ran test claim for Entresto . Currently a quantity of 180 is a 90 day supply and the co-pay is $4 .  This test claim was processed through [facility]- copay amounts may vary at other pharmacies due to pharmacy/plan contracts, or as the patient moves through the different stages of their insurance plan.   Almarie JULIANNA Pa, CPhT

## 2023-12-11 NOTE — Telephone Encounter (Addendum)
 Med list updated to reflect changes. My chart message sent to Grayce of new med list and she has also been called   Checked cost of Entresto  it will be $4 for 90 days. Has an appointment coming up with endo to establish with them. Per Zenaida change Metoprolol  Tartrate to Toprol  XL 25 mg Twice daily

## 2024-01-05 ENCOUNTER — Telehealth (HOSPITAL_COMMUNITY): Payer: Self-pay | Admitting: Cardiology

## 2024-01-05 NOTE — Telephone Encounter (Signed)
 Called to confirm/remind patient of their appointment [facility] on 01/05/2024.   Appointment:   [] Confirmed  [] Left mess   [x] No answer/No voice mail  [] VM Full/unable to leave message  [] Phone not in service  Patient reminded to bring all medications and/or complete list.  Confirmed patient has transportation. Gave directions, instructed to utilize valet parking.

## 2024-01-06 ENCOUNTER — Ambulatory Visit (HOSPITAL_COMMUNITY): Admission: RE | Admit: 2024-01-06 | Source: Ambulatory Visit

## 2024-01-06 ENCOUNTER — Encounter (HOSPITAL_COMMUNITY): Admitting: Cardiology

## 2024-01-13 ENCOUNTER — Other Ambulatory Visit (HOSPITAL_COMMUNITY): Payer: Self-pay | Admitting: Cardiology

## 2024-01-13 MED ORDER — METHIMAZOLE 10 MG PO TABS: 10.0000 mg | ORAL_TABLET | Freq: Every day | ORAL | 0 refills | Status: DC

## 2024-01-16 ENCOUNTER — Ambulatory Visit (HOSPITAL_COMMUNITY)
Admission: RE | Admit: 2024-01-16 | Discharge: 2024-01-16 | Disposition: A | Discharge status: Home / Self Care | Source: Ambulatory Visit | Attending: Cardiology | Admitting: Cardiology

## 2024-01-16 DIAGNOSIS — I081 Rheumatic disorders of both mitral and tricuspid valves: Secondary | ICD-10-CM | POA: Diagnosis not present

## 2024-01-16 DIAGNOSIS — I110 Hypertensive heart disease with heart failure: Secondary | ICD-10-CM | POA: Insufficient documentation

## 2024-01-16 DIAGNOSIS — I5032 Chronic diastolic (congestive) heart failure: Secondary | ICD-10-CM | POA: Diagnosis not present

## 2024-01-16 LAB — ECHOCARDIOGRAM COMPLETE
Area-P 1/2: 1.55 cm2
Est EF: 55
MV VTI: 1.28 cm2
S' Lateral: 3.6 cm
Single Plane A4C EF: 51.3 %

## 2024-01-16 NOTE — Progress Notes (Signed)
  Echocardiogram 2D Echocardiogram has been performed.  Aaron [name] 01/16/2024, 2:52 PM

## 2024-01-27 ENCOUNTER — Ambulatory Visit (HOSPITAL_COMMUNITY): Payer: Self-pay | Admitting: Cardiology

## 2024-02-03 ENCOUNTER — Telehealth (HOSPITAL_COMMUNITY): Payer: Self-pay | Admitting: Cardiology

## 2024-02-03 NOTE — Telephone Encounter (Signed)
 Called to confirm/remind patient of their appointment at [facility] on 02/03/24.   Appointment:   [] Confirmed  [] Left mess   [] No answer/No voice mail  [x] VM Full/unable to leave message  [] Phone not in service  Patient reminded to bring all medications and/or complete list.  Confirmed patient has transportation. Gave directions, instructed to utilize valet parking.

## 2024-02-04 ENCOUNTER — Encounter: Payer: Self-pay | Admitting: Endocrinology

## 2024-02-04 ENCOUNTER — Ambulatory Visit (HOSPITAL_COMMUNITY)
Admission: RE | Admit: 2024-02-04 | Discharge: 2024-02-04 | Disposition: A | Discharge status: Home / Self Care | Source: Ambulatory Visit | Attending: Cardiology | Admitting: Cardiology

## 2024-02-04 ENCOUNTER — Ambulatory Visit: Admitting: Endocrinology

## 2024-02-04 VITALS — BP 102/70 | HR 64 | Resp 20 | Ht 76.0 in | Wt 217.6 lb

## 2024-02-04 VITALS — BP 124/80 | HR 75 | Wt 217.6 lb

## 2024-02-04 DIAGNOSIS — Z8674 Personal history of sudden cardiac arrest: Secondary | ICD-10-CM | POA: Diagnosis not present

## 2024-02-04 DIAGNOSIS — Z7901 Long term (current) use of anticoagulants: Secondary | ICD-10-CM | POA: Insufficient documentation

## 2024-02-04 DIAGNOSIS — F32A Depression, unspecified: Secondary | ICD-10-CM | POA: Insufficient documentation

## 2024-02-04 DIAGNOSIS — N189 Chronic kidney disease, unspecified: Secondary | ICD-10-CM | POA: Insufficient documentation

## 2024-02-04 DIAGNOSIS — Z79899 Other long term (current) drug therapy: Secondary | ICD-10-CM | POA: Insufficient documentation

## 2024-02-04 DIAGNOSIS — R4584 Anhedonia: Secondary | ICD-10-CM | POA: Insufficient documentation

## 2024-02-04 DIAGNOSIS — I059 Rheumatic mitral valve disease, unspecified: Secondary | ICD-10-CM | POA: Insufficient documentation

## 2024-02-04 DIAGNOSIS — R079 Chest pain, unspecified: Secondary | ICD-10-CM | POA: Diagnosis not present

## 2024-02-04 DIAGNOSIS — E0500 Thyrotoxicosis with diffuse goiter without thyrotoxic crisis or storm: Secondary | ICD-10-CM

## 2024-02-04 DIAGNOSIS — I4891 Unspecified atrial fibrillation: Secondary | ICD-10-CM | POA: Insufficient documentation

## 2024-02-04 DIAGNOSIS — I5032 Chronic diastolic (congestive) heart failure: Secondary | ICD-10-CM | POA: Insufficient documentation

## 2024-02-04 DIAGNOSIS — I2699 Other pulmonary embolism without acute cor pulmonale: Secondary | ICD-10-CM | POA: Insufficient documentation

## 2024-02-04 NOTE — Progress Notes (Signed)
 Outpatient Endocrinology Note [place] Nimah Uphoff, MD   Patient's Name: Aaron [name]    DOB: 05-11-1986    MRN: [id number]  REASON OF VISIT: New consult for hyperthyroidism Danise' disease.  REFERRING PROVIDER: Zenaida Morene PARAS, MD  PCP: Paseda, Folashade R, FNP  HISTORY OF PRESENT ILLNESS:   Aaron [name] is a 38 y.o. old male with past medical history as listed below is presented for new consult for hyperthyroidism / Graves' disease.  Patient is accompanied by fianc in the clinic today.  Pertinent Thyroid  History: Patient was diagnosed with hyperthyroidism/Graves' disease with elevated thyrotropin receptor antibody in June 2023.  At the time of diagnosis she had a suppressed TSH with elevated free T3 of 19.8 and elevated free T4 of 4.55.  TRAb was 25.  He was started on methimazole  he had taken methimazole  up to 30 mg daily.  He reports he was not fully compliant in the past lately he has been compliant and taking methimazole  every day.  At least from May 2025 he has been taking methimazole  10 mg daily.  Overall he has improvement on hyperthyroidism, however is still not normal.  Most recent thyroid  function test from December 09, 2023 with low TSH of 0.015, elevated free T3 of 5.3 and elevated free T4 of 1.44.  Patient has complaints of intermittent palpitation, cold and heat intolerance, denies bowel movement problem.  He has complaints of neck discomfort.  He reports fluctuating body weight.  He has complaints of watering and occasional redness of the eyes.  He reports he has been compliant with all the medication per medication list, he does not recall the exact name of the medicine including methimazole  and metoprolol .  He has been on metoprolol  XL 25 mg daily.  He has significant heart condition with primary mitral regurgitation with reduced ejection fraction, CHF, atrial fibrillation, and cardiac arrest following with cardiology.  He reports family history of multiple family members with  thyroid  disorder however does not know his specific.  Labs :   Latest Reference Range & Units 12/09/23 16:43 12/09/23 16:48  TSH 0.350 - 4.500 uIU/mL  0.015 (L)  Triiodothyronine,Free,Serum 2.0 - 4.4 pg/mL  5.3 (H)  T4,Free(Direct) 0.61 - 1.12 ng/dL 8.55 (H)   (L): Data is abnormally low (H): Data is abnormally high   Latest Reference Range & Units 10/21/21 15:48  Thyrotropin Receptor Ab 0.00 - 1.75 IU/L 25.00 (H)  (H): Data is abnormally high  He had ultrasound thyroid  in June 2023 reviewed images overall heterogenous enlarged thyroid  parenchyma and a small mostly hypoechoic multiple nodules, consistent with chronic inflammatory thyroid  disorder.  Interval history  Patient presented to establish endocrinology care for known history of Graves' disease/hypothyroidism.  He has been taking methimazole  10 mg daily.  REVIEW OF SYSTEMS:  As per history of present illness.   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   Hyperthyroidism    Mitral regurgitation    NSTEMI (non-ST elevated myocardial infarction) [facility])     PAST SURGICAL HISTORY: Past Surgical History:  Procedure Laterality Date   ECMO CANNULATION N/A 07/10/2023   Procedure: ECMO CANNULATION;  Surgeon: Cherrie Toribio SAUNDERS, MD;  Location: [facility] INVASIVE CV LAB;  Service: Cardiovascular;  Laterality: N/A;  EMBOLECTOMY Left 07/18/2023   Procedure: EMBOLECTOMY Left Femoral, Popliteal and iliac arterys,  Repair Left common femoral artery.;  Surgeon: Lanis Fonda BRAVO, MD;  Location: [facility] OR;  Service: Vascular;  Laterality: Left;   INTRAOPERATIVE TRANSESOPHAGEAL ECHOCARDIOGRAM N/A 07/23/2023   Procedure: ECHOCARDIOGRAM, TRANSESOPHAGEAL,  INTRAOPERATIVE;  Surgeon: Kerrin Elspeth BROCKS, MD;  Location: [facility] OR;  Service: Open Heart Surgery;  Laterality: N/A;   IR FLUORO GUIDE CV LINE RIGHT  08/08/2023   IR REMOVAL TUN CV CATH W/O FL  08/14/2023   IR US  GUIDE VASC ACCESS RIGHT  08/08/2023   LEFT HEART CATH AND CORONARY ANGIOGRAPHY N/A 10/22/2021   Procedure: LEFT HEART CATH AND CORONARY ANGIOGRAPHY;  Surgeon: Mady Bruckner, MD;  Location: [facility] INVAS[facility] CV LAB;  Service: Cardiovascular;  Laterality: N/A;   PLACEMENT OF IMPELLA LEFT VENTRICULAR ASSIST DEVICE Right 07/14/2023   Procedure: PLACEMENT OF RIGHT AXILLARY IMPELLA 5.5 LEFT VENTRICULAR ASSIST DEVICE;  Surgeon: Kerrin Elspeth BROCKS, MD;  Location: [facility] OR;  Service: Open Heart Surgery;  Laterality: Right;   REMOVAL OF IMPELLA LEFT VENTRICULAR ASSIST DEVICE Right 07/14/2023   Procedure: REMOVAL OF CP RIGHT FEMORAL IMPELLA LEFT VENTRICULAR ASSIST DEVICE;  Surgeon: Kerrin Elspeth BROCKS, MD;  Location: [facility] OR;  Service: Open Heart Surgery;  Laterality: Right;   REMOVAL OF IMPELLA LEFT VENTRICULAR ASSIST DEVICE N/A 07/23/2023   Procedure: REMOVAL, CARDIAC ASSIST DEVICE, IMPELLA;  Surgeon: Kerrin Elspeth BROCKS, MD;  Location: [facility] OR;  Service: Open Heart Surgery;  Laterality: N/A;   TRANSCATHETER MITRAL EDGE TO EDGE REPAIR N/A 07/16/2023   Procedure: TRANSCATHETER MITRAL EDGE TO EDGE REPAIR;  Surgeon: Wonda Sharper, MD;  Location: [facility] INVASIVE CV LAB;  Service: Cardiovascular;  Laterality: N/A;   TRANSESOPHAGEAL ECHOCARDIOGRAM (CATH LAB) N/A 07/16/2023   Procedure: TRANSESOPHAGEAL ECHOCARDIOGRAM;  Surgeon: Wonda Sharper, MD;  Location: [facility] INVASIVE CV LAB;  Service: Cardiovascular;  Laterality: N/A;   VENTRICULAR ASSIST DEVICE INSERTION N/A 07/10/2023   Procedure: VENTRICULAR ASSIST DEVICE INSERTION;  Surgeon: Cherrie Toribio SAUNDERS, MD;  Location: [facility] INVASIVE CV LAB;  Service: Cardiovascular;  Laterality: N/A;    ALLERGIES: No Known Allergies  FAMILY HISTORY:  Family History  Problem Relation  Age of Onset   Heart disease Other     SOCIAL HISTORY: Social History   Socioeconomic History   Marital status: Single    Spouse name: Not on file   Number of children: Not on file   Years of education: Not on file   Highest education level: Not on file  Occupational History   Not on file  Tobacco Use   Smoking status: Never   Smokeless tobacco: Never  Vaping Use   Vaping status: Never Used  Substance and Sexual Activity   Alcohol use: No   Drug use: Yes    Frequency: 3.0 times per week    Types: Marijuana   Sexual activity: Never  Other Topics Concern   Not on file  Social History Narrative   Lives with fiance   Social Drivers of Health   Financial Resource Strain: Medium Risk (04/01/2023)   Overall Financial Resource Strain (CARDIA)    Difficulty of Paying Living Expenses: Somewhat hard  Food Insecurity: No Food Insecurity (07/10/2023)   Hunger Vital Sign    Worried About Running Out of Food in the Last Year: Never true    Ran Out of Food in the Last Year: Never true  Transportation Needs: No Transportation Needs (07/10/2023)   PRAPARE - Administrator, Civil Service (Medical): No  Lack of Transportation (Non-Medical): No  Physical Activity: Not on file  Stress: Not on file  Social Connections: Not on file    MEDICATIONS:  Current Outpatient Medications  Medication Sig Dispense Refill   apixaban  (ELIQUIS ) 5 MG TABS tablet Take 1 tablet (5 mg total) by mouth 2 (two) times daily. 60 tablet 0   ascorbic acid  (VITAMIN C ) 500 MG tablet Take 0.5 tablets (250 mg total) by mouth 2 (two) times daily. 60 tablet 0   methimazole  (TAPAZOLE ) 10 MG tablet Take 1 tablet (10 mg total) by mouth daily. 30 tablet 0   metoprolol  succinate (TOPROL  XL) 25 MG 24 hr tablet Take 1 tablet (25 mg total) by mouth daily. 90 tablet 3   sacubitril -valsartan  (ENTRESTO ) 24-26 MG Take 1 tablet by mouth 2 (two) times daily. 180 tablet 3   spironolactone  (ALDACTONE ) 25 MG tablet Take  25 mg by mouth daily.     No current facility-administered medications for this visit.    PHYSICAL EXAM: Vitals:   02/04/24 1313  BP: 102/70  Pulse: 64  Resp: 20  SpO2: 99%  Weight: 217 lb 9.6 oz (98.7 kg)  Height: 6' 4 (1.93 m)   Body mass index is 26.49 kg/m.  Wt Readings from Last 3 Encounters:  02/04/24 217 lb 9.6 oz (98.7 kg)  12/10/23 233 lb (105.7 kg)  12/09/23 237 lb (107.5 kg)    General: Well developed, well nourished male in no apparent distress.  HEENT: AT/[place], no external lesions. Hearing intact to the spoken word Eyes: EOMI. No stare. Mild proptosis. Conjunctiva clear and no icterus. No erythema or watering Neck: Trachea midline, neck supple with appreciable thyromegaly 2X or no lymphadenopathy and no palpable thyroid  nodules Lungs: Clear to auscultation, no wheeze. Respirations not labored Heart: S1S2, Regular in rate and rhythm.  Abdomen: Soft, non tender, non distended Neurologic: Alert, oriented, normal speech, deep tendon biceps reflexes normal,  no gross focal neurological deficit Extremities: No pedal pitting edema, no tremors of outstretched hands Skin: Warm, color good.  Psychiatric: Does not appear depressed or anxious  PERTINENT HISTORIC LABORATORY AND IMAGING STUDIES:  All pertinent laboratory results were reviewed. Please see HPI also for further details.   TSH  Date Value Ref Range Status  12/09/2023 0.015 (L) 0.350 - 4.500 uIU/mL Final    Comment:    Performed by a 3rd Generation assay with a functional sensitivity of <=0.01 uIU/mL. Performed at [facility], [address], [place], [place] [address]   07/25/2023 0.033 (L) 0.350 - 4.500 uIU/mL Final    Comment:    Performed by a 3rd Generation assay with a functional sensitivity of <=0.01 uIU/mL. Performed at [facility], [address] [place], [place] [address]   07/16/2023 <0.010 (L) 0.350 - 4.500 uIU/mL Final    Comment:    Performed by a 3rd Generation assay with a  functional sensitivity of <=0.01 uIU/mL. Performed at [facility], [address] [place], [place] [address]     Lab Results  Component Value Date   FREET4 1.44 (H) 12/09/2023   FREET4 0.26 (L) 07/25/2023   FREET4 0.43 (L) 07/16/2023   T3FREE 5.3 (H) 12/09/2023   T3FREE 4.2 07/11/2023   T3FREE 20.3 (H) 03/20/2023   TSH 0.015 (L) 12/09/2023   TSH 0.033 (L) 07/25/2023   TSH <0.010 (L) 07/16/2023    Lab Results  Component Value Date   THYROTRECAB 25.00 (H) 10/21/2021    Lab Results  Component  Value Date   TSH 0.015 (L) 12/09/2023   TSH 0.033 (L) 07/25/2023   TSH <0.010 (L) 07/16/2023   FREET4 1.44 (H) 12/09/2023   FREET4 0.26 (L) 07/25/2023   FREET4 0.43 (L) 07/16/2023     Lab Results  Component Value Date   TSI 14.80 (H) 10/20/2021     No components found for: TRAB    ASSESSMENT / PLAN  1. Graves disease   2. Graves' eye disease    Patient has hyperthyroidism secondary to Graves' disease.  He had elevated TRAb.  He was diagnosed in June 2023.  He has been on methimazole  was not fully compliant in the past however has been reportedly compliant, currently taking methimazole  10 mg daily.  He has also been taking metoprolol  metoprolol  XL 25 mg daily.  He has significant heart disease with atrial fibrillation, CHF, following with cardiology.    The three options of therapy for hyperthyroidism were discussed with the patient, including thionamide drug therapy, thyroidectomy, and radioactive iodine ablation. I reviewed the possible complications of rash, agranulocytosis, or liver dysfunction associated with anti-thyroid  therapies. I reviewed the risks of hemorrhage, hypocalcemia, hoarseness and hypothyroidism after thyroidectomy. Regarding radioactive iodine ablation, I informed the patient that most patients treated with radioactive iodine can be cured of hyperthyroidism with a single dose, but to about 15-20% may require an additional dose. I emphasized that most  patients treated with radioactive iodine develop permanent hypothyroidism, requiring lifelong replacement with thyroid  hormone. I discussed the rare occurrence of transient increase in thyroid  hormone levels after radioactive iodine and associated with symptoms, possible worsening of Grave's eye disease, and the likely small but not insignificant risks associated with radiation exposure of this kind.   At this time we will treat with antithyroid medication.  He has Graves' eye disease as well, not active at this time.  Will refer to ophthalmology.  Due to his Graves' eye disease, radioactive iodine treatment is not a preferred permanent treatment.  He has significant heart conditions including CHF and atrial fibrillation and mitral regurgitation, if he does not have reasonable control of thyroid  hormone levels with antithyroid medication and if compliance can be issue we will plan for permanent treatment option.  Discussed that overactive thyroid  can aggravate his current heart conditions.  Plan: - Check thyroid  function test.  I would also like to check TRAb. -Will adjust her dose of methimazole  based on thyroid  hormone level today. - Close about compliance with medication including methimazole .  Continue metoprolol  as per cardiology currently on 25 mg daily.  Labs reviewed worsening thyroid  hormone levels.  I would like to increase methimazole  from 10 to 15 mg daily.  Will check lab at the follow-up visit in November.   Latest Reference Range & Units 02/04/24 14:05  TSH 0.40 - 4.50 mIU/L <0.01 (L)  Triiodothyronine,Free,Serum 2.3 - 4.2 pg/mL 7.7 (H)  T4,Free(Direct) 0.8 - 1.8 ng/dL 2.3 (H)  (L): Data is abnormally low (H): Data is abnormally high  Diagnoses and all orders for this visit:  Graves disease -     T4, free -     T3, free -     TSH -     TRAb (TSH Receptor Binding Antibody) -     Ambulatory referral to Ophthalmology  Graves' eye disease -     Ambulatory referral to  Ophthalmology    DISPOSITION Follow up in clinic in 2 months suggested.  All questions answered and patient verbalized understanding of the plan.  [place] Taheem Fricke,  MD [facility] Endocrinology [facility] [address] [place], [address] [place], [place] [address] Phone # [phone]   At least part of this note was generated using voice recognition software. Inadvertent word errors may have occurred, which were not recognized during the proofreading process.

## 2024-02-04 NOTE — Patient Instructions (Addendum)
 There has been no changes to your medications.    Your cardiac CT will be scheduled at one of the below locations:   [facility] [address]  [place], [place] [address] [phone]  If scheduled at the Heart and Vascular Tower at [facility], please enter the parking lot using the [address] entrance and use the FREE valet service at the patient drop-off area. Enter the building and check-in with registration on the main floor.  Please follow these instructions carefully (unless otherwise directed):  An IV will be required for this test and Nitroglycerin  will be given.  Hold all erectile dysfunction medications at least 3 days (72 hrs) prior to test. (Ie viagra, cialis, sildenafil, tadalafil, etc)   On the Night Before the Test: Be sure to Drink plenty of water. Do not consume any caffeinated/decaffeinated beverages or chocolate 12 hours prior to your test. Do not take any antihistamines 12 hours prior to your test.  On the Day of the Test: Drink plenty of water until 1 hour prior to the test. Do not eat any food 1 hour prior to test. You may take your regular medications prior to the test.  Take metoprolol  (Lopressor ) two hours prior to test. If you take Furosemide /Hydrochlorothiazide/Spironolactone /Chlorthalidone, please HOLD on the morning of the test. Patients who wear a continuous glucose monitor MUST remove the device prior to scanning. FEMALES- please wear underwire-free bra if available, avoid dresses & tight clothing      After the Test: Drink plenty of water. After receiving IV contrast, you may experience a mild flushed feeling. This is normal. On occasion, you may experience a mild rash up to 24 hours after the test. This is not dangerous. If this occurs, you can take Benadryl 25 mg, Zyrtec, Claritin, or Allegra and increase your fluid intake. (Patients taking Tikosyn should avoid Benadryl, and may take Zyrtec, Claritin, or  Allegra) If you experience trouble breathing, this can be serious. If it is severe call 911 IMMEDIATELY. If it is mild, please call our office.  We will call to schedule your test 2-4 weeks out understanding that some insurance companies will need an authorization prior to the service being performed.   For more information and frequently asked questions, please visit our website : [email or web address]  For non-scheduling related questions, please contact the cardiac imaging nurse navigator should you have any questions/concerns: Cardiac Imaging Nurse Navigators Direct Office Dial: [phone]   For scheduling needs, including cancellations and rescheduling, please call [place], [phone].  Your physician recommends that you schedule a follow-up appointment in: 3 months.  If you have any questions or concerns before your next appointment please send us  a message through [place] or call our office at [phone].    TO LEAVE A MESSAGE FOR THE NURSE SELECT OPTION 2, PLEASE LEAVE A MESSAGE INCLUDING: YOUR NAME DATE OF BIRTH CALL BACK NUMBER REASON FOR CALL**this is important as we prioritize the call backs  YOU WILL RECEIVE A CALL BACK THE SAME DAY AS LONG AS YOU CALL BEFORE 4:00 PM  At the [facility], you and your health needs are our priority. As part of our continuing mission to provide you with exceptional heart care, we have created designated Provider Care Teams. These Care Teams include your primary Cardiologist (physician) and Advanced Practice Providers (APPs- Physician Assistants and Nurse Practitioners) who all work together to provide you with the care you need, when you need it.   You may  see any of the following providers on your designated Care Team at your next follow up: Dr Toribio Fuel Dr Ezra Shuck Dr. Ria Commander Dr. Morene Brownie Amy Lenetta, NP Caffie Shed, [place] [facility] [place], [place] Beckey Coe,  NP [place] Lee, NP Ellouise Class, NP Tinnie Redman, PharmD Jaun Bash, PharmD   Please be sure to bring in all your medications bottles to every appointment.    Thank you for choosing [facility]

## 2024-02-06 ENCOUNTER — Ambulatory Visit: Payer: Self-pay | Admitting: Endocrinology

## 2024-02-06 MED ORDER — METHIMAZOLE 5 MG PO TABS: 15.0000 mg | ORAL_TABLET | Freq: Every day | ORAL | 3 refills | Status: DC

## 2024-02-06 NOTE — Addendum Note (Signed)
 Addended by: Pegeen Stiger, [place] on: 02/06/2024 01:04 PM   Modules accepted: Orders

## 2024-02-07 LAB — TRAB (TSH RECEPTOR BINDING ANTIBODY): TRAB: 23.32 IU/L — ABNORMAL HIGH (ref <=2.00)

## 2024-02-07 LAB — T3, FREE: T3, Free: 7.7 pg/mL — ABNORMAL HIGH (ref 2.3–4.2)

## 2024-02-07 LAB — TSH: TSH: <0.01 m[IU]/L — ABNORMAL LOW (ref 0.40–4.50)

## 2024-02-07 LAB — T4, FREE: Free T4: 2.3 ng/dL — ABNORMAL HIGH (ref 0.8–1.8)

## 2024-02-15 NOTE — Progress Notes (Signed)
 ADVANCED HEART FAILURE FOLLOW UP CLINIC NOTE  Referring Physician: Paseda, Folashade R, FNP  Primary Care: Paseda, Folashade R, FNP Primary Cardiologist:  HPI: Aaron [name] is a 38 y.o. male who presents for follow up of recent .      Patient with a longstanding history of primary mitral regurgitation with reduced ejection fraction as well as Grave's disease.  He had had progressive heart failure as an outpatient.  He was admitted in February 2025 for progressive shortness of breath.  He was found to have a small pulmonary embolism without significant right heart strain.  He was given fluids, nodal blockade for A-fib with RVR and subsequently became altered, hypoxic, with ultimate cardiac arrest.  ECMO team was consulted and patient was emergently taken to [facility] for ECMO cannulation with Impella CP drainage.  He was upgraded to a 5.5 and given his severe mitral regurgitation was arranged to have MitraClip performed.  Following MitraClip he was able to come off hemodynamic support.  Course complicated by acute renal failure requiring dialysis that slowly improved over the course of his admission.  He went to CIR and was discharged without hemodialysis catheter.    SUBJECTIVE:  Has had significant weight loss and is breathing better since his last appointment.  Has finally seen endocrinology, potential plans for thyroidectomy in the future given his difficulty control Graves' disease.  Unfortunately, he still does not feel particularly well or back to his self.  Accompanied by his fiance today.  We discussed, several symptoms may be related to posttraumatic stress disorder from his extensive hospitalization.  Echocardiogram reviewed, showing normal ejection fraction, only mild stenosis of his repaired mitral valve.  He does note occasional chest discomfort and pain, no clear triggers associated with exertion or stress.  PMH, current medications, allergies, social history, and family  history reviewed in epic.  PHYSICAL EXAM: Vitals:   02/04/24 1454  BP: 124/80  Pulse: 75  SpO2: 99%    GENERAL: NAD, well appearing PULM:  Normal work of breathing, CTAB CARDIAC:  JVP: Flat         Regular rate and rhythm, systolic murmur, no edema ABDOMEN: Soft, non-tender, non-distended. NEUROLOGIC: Patient is oriented x3 with no focal or lateralizing neurologic deficits.     DATA REVIEW  ECG: 08/20/23: NSR, rightward axis, normal QRS    ECHO: 03/2023: LVEF 45-50%, severe MR with bileaflet prolapse, myxomatous valve with flail segment 07/2023: LVEF 40-45%, mildly decreased function, LVH, normal RV function, mild MS with mean gradient 6, PVA 1.6cm2, 3 mitraclip in appropriate position 01/2024: LVEF 55%, normal RV systolic function, moderately dilated left atrium, 3 MitraClip's with mean gradient 9 at a heart rate of 88  CATH: 10/2021: Normal coronary angiography  CMR:  10/23/2021: LVEF 49%, small apical transmural LGE consistent with scar   ReDs reading: 44 %, abnormal, lasix  as below    ASSESSMENT & PLAN:  Chronic heart failure with improved ejection fraction: Previously 40 to 45% after MitraClip, improved to 55% on most recent echocardiogram.  Appears euvolemic today. -NYHA class II-III symptoms, but suspect multifactorial - Continue metoprolol  succinate 25 mg daily, potentially increase at next visit - Continue Entresto  24/26 mg twice daily - Not on scheduled Lasix  - Echocardiogram reviewed in clinic today  Mitral valve disease: Primary MR, s/p bailout mitraclip x3 after cardiac arrest.  Mild mitral stenosis at heart rate of 88, clips appear well-seated, no significant MR. - Serial echo, follow up in structural clinic  CKD: Labs ordered  today, creatinine now 1. - Previously on dialysis - Continue Entresto  as above, consider addition of SGLT2 at next visit  Grave's disease: Thankfully has finally seen endocrinology, considering long-term therapy for his thyroid . -  Continue methimazole  50 mg daily  Atrial fibrillation: Suspected in the setting of thyrotoxicosis and critical illness. Now back in NSR. Given small apical infarct suspect embolic disease history as well.  - Continue apixaban  5mg  BID   Pulmonary embolism:  - Small, noted during previous admission. - Continue OAC  Depression: Altered mood, appetite, and evidence of anhedonia.  Suspect multifactorial with thyroid  disease, previous critical illness - Discussed psychiatric referral and medications, patient would like to hold off at this time - Would benefit from support - Considering a service dog  Morene Brownie, MD Advanced [facility] Failure Mechanical Circulatory Support 02/15/24

## 2024-02-27 ENCOUNTER — Telehealth (HOSPITAL_COMMUNITY): Payer: Self-pay | Admitting: Cardiology

## 2024-03-12 ENCOUNTER — Ambulatory Visit (INDEPENDENT_AMBULATORY_CARE_PROVIDER_SITE_OTHER): Payer: Self-pay | Admitting: Nurse Practitioner

## 2024-03-12 VITALS — BP 132/83 | HR 67 | Wt 233.0 lb

## 2024-03-12 DIAGNOSIS — I10 Essential (primary) hypertension: Secondary | ICD-10-CM | POA: Diagnosis not present

## 2024-03-12 DIAGNOSIS — I5032 Chronic diastolic (congestive) heart failure: Secondary | ICD-10-CM | POA: Diagnosis not present

## 2024-03-12 DIAGNOSIS — I2699 Other pulmonary embolism without acute cor pulmonale: Secondary | ICD-10-CM | POA: Diagnosis not present

## 2024-03-12 DIAGNOSIS — D649 Anemia, unspecified: Secondary | ICD-10-CM | POA: Diagnosis not present

## 2024-03-12 DIAGNOSIS — M25572 Pain in left ankle and joints of left foot: Secondary | ICD-10-CM

## 2024-03-12 DIAGNOSIS — E0590 Thyrotoxicosis, unspecified without thyrotoxic crisis or storm: Secondary | ICD-10-CM

## 2024-03-12 DIAGNOSIS — F419 Anxiety disorder, unspecified: Secondary | ICD-10-CM

## 2024-03-12 DIAGNOSIS — Z8673 Personal history of transient ischemic attack (TIA), and cerebral infarction without residual deficits: Secondary | ICD-10-CM | POA: Insufficient documentation

## 2024-03-12 DIAGNOSIS — G8929 Other chronic pain: Secondary | ICD-10-CM | POA: Insufficient documentation

## 2024-03-12 DIAGNOSIS — I4891 Unspecified atrial fibrillation: Secondary | ICD-10-CM | POA: Diagnosis not present

## 2024-03-12 DIAGNOSIS — F32A Depression, unspecified: Secondary | ICD-10-CM

## 2024-03-12 NOTE — Assessment & Plan Note (Signed)
 Continue methimazole  15 mg daily Maintain close follow-up with endocrinology

## 2024-03-12 NOTE — Assessment & Plan Note (Signed)
 re

## 2024-03-12 NOTE — Assessment & Plan Note (Signed)
 Anxiety and Depression Declined to discuss anxiety and depression. No interest in counseling or therapy. No thoughts of self-harm reported. - Offer to discuss medications or therapy if interested    .

## 2024-03-12 NOTE — Assessment & Plan Note (Addendum)
 SABRA

## 2024-03-12 NOTE — Assessment & Plan Note (Signed)
  Left Ankle Pain, Status Post Remote Fracture and Ligament Injury Chronic left ankle pain exacerbated by walking. No current swelling. Pain occurs with pressure during walking. - Refer to orthopedics for evaluation and potential x-ray. - Recommend Tylenol  650 mg every 6 hours as needed for pain. - Advise wearing comfortable shoes.

## 2024-03-12 NOTE — Assessment & Plan Note (Signed)
 Blood pressure is elevated in the office today but was  well-controlled at last visit with cardiology,continue Metoprolol  25 mg daily, spironolactone  25 mg daily Entresto  24-26 mg 1 tablet twice daily - Advise on heart-healthy diet: low salt, low fat. - Recommend 30 minutes of moderate exercise as tolerated.      03/12/2024   11:35 AM 03/12/2024   10:55 AM 02/04/2024    2:54 PM 02/04/2024    1:13 PM 12/10/2023    3:06 PM 12/10/2023    2:54 PM 12/09/2023    3:50 PM  BP/Weight  Systolic BP 132 130 124 102 153 156 150  Diastolic BP 83 80 80 70 97 93 90  Wt. (Lbs)  233 217.6 217.6  233 237  BMI  28.36 kg/m2 26.49 kg/m2 26.49 kg/m2  28.36 kg/m2 28.85 kg/m2

## 2024-03-12 NOTE — Assessment & Plan Note (Signed)
 Lab Results  Component Value Date   WBC 4.8 12/09/2023   HGB 12.1 (L) 12/09/2023   HCT 35.3 (L) 12/09/2023   MCV 86.5 12/09/2023   PLT 242 12/09/2023  Rechecking CBC

## 2024-03-12 NOTE — Assessment & Plan Note (Signed)
 Continue Eliquis 5 mg twice daily

## 2024-03-12 NOTE — Patient Instructions (Signed)
 1. Chronic pain of left ankle (Primary)  - Ambulatory referral to Orthopedic Surgery  It is important that you exercise regularly at least 30 minutes 5 times a week as tolerated  Think about what you will eat, plan ahead. Choose  clean, green, fresh or frozen over canned, processed or packaged foods which are more sugary, salty and fatty. 70 to 75% of food eaten should be vegetables and fruit. Three meals at set times with snacks allowed between meals, but they must be fruit or vegetables. Aim to eat over a 12 hour period , example 7 am to 7 pm, and STOP after  your last meal of the day. Drink water,generally about 64 ounces per day, no other drink is as healthy. Fruit juice is best enjoyed in a healthy way, by EATING the fruit.  Thanks for choosing [facility] we consider it a privelige to serve you.

## 2024-03-12 NOTE — Assessment & Plan Note (Signed)
 Atrial Fibrillation and Chronic Heart Failure with Preserved Ejection Fraction Intermittent chest pain and shortness of breath reported. Blood pressure well-controlled. Continues on Apixaban , Metoprolol , and Spironolactone . - Encourage maintaining close follow-up with cardiologist Dr. Zenaida. Continue metoprolol  25 mg daily, Eliquis  5 mg twice daily

## 2024-03-12 NOTE — Progress Notes (Signed)
 Established Patient Office Visit  Subjective:  Patient ID: Aaron [name], male    DOB: 13-Feb-1986  Age: 38 y.o. MRN: [id number]  CC:  Chief Complaint  Patient presents with   Medical Management of Chronic Issues    HPI   Discussed the use of AI scribe software for clinical note transcription with the patient, who gave verbal consent to proceed.  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  who presents for a follow-up visit.  He is accompanied by his fiance  He experiences occasional chest pain and shortness of breath, though not on the day of the visit. No swelling in his feet is noted.  He reports taking his  medications daily as ordered  He has left ankle pain, especially when walking, linked to a 2010 injury involving a fracture and torn ligaments. He has not undergone surgery for this injury, and the pain has become more noticeable recently.  He has a history of anxiety and depression. No thoughts of self-harm are present.  He engages in physical activity by walking around his apartment complex and is mindful of his diet, although he occasionally eats fried foods.  Assessment & Plan  General Health Maintenance Encouraged to consider flu and pneumonia vaccines. Advised on heart-healthy lifestyle including diet and exercise. - Encourage flu and pneumonia vaccinations. 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) 08/11/2023   Hyperthyroidism    Mitral regurgitation    NSTEMI (non-ST elevated myocardial infarction) [facility])     Past Surgical History:  Procedure Laterality Date   ECMO CANNULATION N/A 07/10/2023   Procedure: ECMO CANNULATION;  Surgeon: Cherrie Toribio SAUNDERS, MD;  Location: [facility] INVASIVE CV LAB;  Service: Cardiovascular;  Laterality: N/A;   EMBOLECTOMY Left 07/18/2023   Procedure: EMBOLECTOMY Left Femoral, Popliteal and iliac arterys,  Repair Left common femoral artery.;  Surgeon: Lanis Fonda BRAVO, MD;  Location: [facility] OR;  Service: Vascular;  Laterality: Left;   INTRAOPERATIVE TRANSESOPHAGEAL ECHOCARDIOGRAM N/A 07/23/2023   Procedure: ECHOCARDIOGRAM, TRANSESOPHAGEAL, INTRAOPERATIVE;  Surgeon: Kerrin Elspeth BROCKS, MD;  Location: [facility] OR;  Service: Open Heart Surgery;  Laterality: N/A;   IR FLUORO GUIDE CV LINE RIGHT  08/08/2023   IR REMOVAL TUN CV CATH W/O FL  08/14/2023   IR US  GUIDE VASC ACCESS RIGHT  08/08/2023   LEFT HEART CATH AND CORONARY ANGIOGRAPHY N/A 10/22/2021   Procedure: LEFT HEART CATH AND CORONARY ANGIOGRAPHY;  Surgeon: Mady Bruckner, MD;  Location: [facility] INVASIVE CV LAB;  Service: Cardiovascular;  Laterality: N/A;   PLACEMENT OF IMPELLA LEFT VENTRICULAR ASSIST DEVICE Right 07/14/2023   Procedure: PLACEMENT OF RIGHT AXILLARY IMPELLA 5.5 LEFT VENTRICULAR ASSIST DEVICE;  Surgeon: Kerrin Elspeth BROCKS, MD;  Location: [facility] OR;  Service: Open Heart Surgery;  Laterality: Right;   REMOVAL OF IMPELLA LEFT VENTRICULAR ASSIST DEVICE Right 07/14/2023   Procedure: REMOVAL OF CP RIGHT FEMORAL IMPELLA LEFT VENTRICULAR ASSIST DEVICE;  Surgeon: Kerrin,  Elspeth BROCKS, MD;  Location: [facility] OR;  Service: Open Heart Surgery;  Laterality: Right;   REMOVAL OF IMPELLA LEFT VENTRICULAR ASSIST DEVICE N/A 07/23/2023   Procedure: REMOVAL, CARDIAC ASSIST DEVICE, IMPELLA;  Surgeon: Kerrin Elspeth BROCKS,  MD;  Location: [facility] OR;  Service: Open Heart Surgery;  Laterality: N/A;   TRANSCATHETER MITRAL EDGE TO EDGE REPAIR N/A 07/16/2023   Procedure: TRANSCATHETER MITRAL EDGE TO EDGE REPAIR;  Surgeon: Wonda Sharper, MD;  Location: [facility] INVASIVE CV LAB;  Service: Cardiovascular;  Laterality: N/A;   TRANSESOPHAGEAL ECHOCARDIOGRAM (CATH LAB) N/A 07/16/2023   Procedure: TRANSESOPHAGEAL ECHOCARDIOGRAM;  Surgeon: Wonda Sharper, MD;  Location: [facility] INVASIVE CV LAB;  Service: Cardiovascular;  Laterality: N/A;   VENTRICULAR ASSIST DEVICE INSERTION N/A 07/10/2023   Procedure: VENTRICULAR ASSIST DEVICE INSERTION;  Surgeon: Cherrie Toribio SAUNDERS, MD;  Location: [facility] INVASIVE CV LAB;  Service: Cardiovascular;  Laterality: N/A;    Family History  Problem Relation Age of Onset   Heart disease Other     Social History   Socioeconomic History   Marital status: Single    Spouse name: Not on file   Number of children: Not on file   Years of education: Not on file   Highest education level: Some college, no degree  Occupational History   Not on file  Tobacco Use   Smoking status: Never   Smokeless tobacco: Never  Vaping Use   Vaping status: Never Used  Substance and Sexual Activity   Alcohol use: No   Drug use: Yes    Frequency: 3.0 times per week    Types: Marijuana   Sexual activity: Never  Other Topics Concern   Not on file  Social History Narrative   Lives with fiance   Social Drivers of Health   Financial Resource Strain: Patient Declined (03/12/2024)   Overall Financial Resource Strain (CARDIA)    Difficulty of Paying Living Expenses: Patient declined  Food Insecurity: Patient Declined (03/12/2024)   Hunger Vital Sign    Worried About Running Out of Food in the Last Year: Patient declined    Ran Out of Food in the Last Year: Patient declined  Transportation Needs: No Transportation Needs (03/12/2024)   PRAPARE - Administrator, Civil Service (Medical): No    Lack of Transportation  (Non-Medical): No  Physical Activity: Insufficiently Active (03/12/2024)   Exercise Vital Sign    Days of Exercise per Week: 4 days    Minutes of Exercise per Session: 20 min  Stress: Stress Concern Present (03/12/2024)   [employer] of Occupational Health - Occupational Stress Questionnaire    Feeling of Stress: Rather much  Social Connections: Socially Isolated (03/12/2024)   Social Connection and Isolation Panel    Frequency of Communication with Friends and Family: Never    Frequency of Social Gatherings with Friends and Family: Never    Attends Religious Services: Never    Database Administrator or Organizations: No    Attends Engineer, Structural: Not on file    Marital Status: Living with partner  Intimate Partner Violence: Not At Risk (07/10/2023)   Humiliation, Afraid, Rape, and Kick questionnaire    Fear of Current or Ex-Partner: No    Emotionally Abused: No    Physically Abused: No    Sexually Abused: No    Outpatient Medications Prior to Visit  Medication Sig Dispense Refill   apixaban  (ELIQUIS ) 5 MG TABS tablet Take 1 tablet (5 mg total) by mouth 2 (two) times daily.  60 tablet 0   methimazole  (TAPAZOLE ) 5 MG tablet Take 3 tablets (15 mg total) by mouth daily. 270 tablet 3   metoprolol  succinate (TOPROL  XL) 25 MG 24 hr tablet Take 1 tablet (25 mg total) by mouth daily. 90 tablet 3   sacubitril -valsartan  (ENTRESTO ) 24-26 MG Take 1 tablet by mouth 2 (two) times daily. 180 tablet 3   spironolactone  (ALDACTONE ) 25 MG tablet Take 25 mg by mouth daily.     ascorbic acid  (VITAMIN C ) 500 MG tablet Take 0.5 tablets (250 mg total) by mouth 2 (two) times daily. (Patient not taking: Reported on 03/12/2024) 60 tablet 0   No facility-administered medications prior to visit.    No Known Allergies  ROS Review of Systems  Constitutional:  Negative for appetite change, chills, fatigue and fever.  HENT:  Negative for congestion, postnasal drip, rhinorrhea and sneezing.    Respiratory:  Negative for cough, shortness of breath and wheezing.   Cardiovascular:  Negative for chest pain, palpitations and leg swelling.  Gastrointestinal:  Negative for abdominal pain, constipation, nausea and vomiting.  Genitourinary:  Negative for difficulty urinating, dysuria, flank pain and frequency.  Musculoskeletal:  Positive for arthralgias. Negative for back pain, joint swelling and myalgias.  Skin:  Negative for color change, pallor, rash and wound.  Neurological:  Negative for dizziness, facial asymmetry, weakness, numbness and headaches.  Psychiatric/Behavioral:  Negative for behavioral problems, confusion, self-injury and suicidal ideas.       Objective:    Physical Exam Vitals and nursing note reviewed.  Constitutional:      General: He is not in acute distress.    Appearance: Normal appearance. He is not ill-appearing, toxic-appearing or diaphoretic.  Eyes:     General: No scleral icterus.       Right eye: No discharge.        Left eye: No discharge.     Extraocular Movements: Extraocular movements intact.     Conjunctiva/sclera: Conjunctivae normal.  Cardiovascular:     Rate and Rhythm: Normal rate and regular rhythm.     Pulses: Normal pulses.     Heart sounds: Normal heart sounds. No murmur heard.    No friction rub. No gallop.  Pulmonary:     Effort: Pulmonary effort is normal. No respiratory distress.     Breath sounds: Normal breath sounds. No stridor. No wheezing, rhonchi or rales.  Chest:     Chest wall: No tenderness.  Abdominal:     General: There is no distension.     Palpations: Abdomen is soft.     Tenderness: There is no abdominal tenderness. There is no right CVA tenderness, left CVA tenderness or guarding.  Musculoskeletal:        General: No swelling, tenderness, deformity or signs of injury.     Right lower leg: No edema.     Left lower leg: No edema.     Comments: No tenderness on palpation  of left foot and ankle .  Skin is warm  and dry no redness or swelling noted  Skin:    General: Skin is warm and dry.     Capillary Refill: Capillary refill takes less than 2 seconds.     Coloration: Skin is not jaundiced or pale.     Findings: No bruising, erythema or lesion.  Neurological:     Mental Status: He is alert and oriented to person, place, and time.     Motor: No weakness.     Coordination: Coordination normal.  Gait: Gait normal.  Psychiatric:        Behavior: Behavior normal.        Thought Content: Thought content normal.        Judgment: Judgment normal.     BP 132/83   Pulse 67   Wt 233 lb (105.7 kg)   SpO2 100%   BMI 28.36 kg/m  Wt Readings from Last 3 Encounters:  03/12/24 233 lb (105.7 kg)  02/04/24 217 lb 9.6 oz (98.7 kg)  02/04/24 217 lb 9.6 oz (98.7 kg)    Lab Results  Component Value Date   TSH <0.01 (L) 02/04/2024   Lab Results  Component Value Date   WBC 4.8 12/09/2023   HGB 12.1 (L) 12/09/2023   HCT 35.3 (L) 12/09/2023   MCV 86.5 12/09/2023   PLT 242 12/09/2023   Lab Results  Component Value Date   NA 141 12/09/2023   K 3.8 12/09/2023   CO2 22 12/09/2023   GLUCOSE 78 12/09/2023   BUN 6 12/09/2023   CREATININE 1.04 12/09/2023   BILITOT 0.9 12/09/2023   ALKPHOS 63 12/09/2023   AST 17 12/09/2023   ALT 20 12/09/2023   PROT 7.1 12/09/2023   ALBUMIN  4.0 12/09/2023   CALCIUM  8.6 (L) 12/09/2023   ANIONGAP 8 12/09/2023   Lab Results  Component Value Date   CHOL 103 10/20/2021   Lab Results  Component Value Date   HDL 33 (L) 10/20/2021   Lab Results  Component Value Date   LDLCALC 65 10/20/2021   Lab Results  Component Value Date   TRIG 115 07/27/2023   Lab Results  Component Value Date   CHOLHDL 3.1 10/20/2021   Lab Results  Component Value Date   HGBA1C 4.8 07/10/2023      Assessment & Plan:   Problem List Items Addressed This Visit       Cardiovascular and Mediastinum   Essential hypertension   Blood pressure is elevated in the office today  but was  well-controlled at last visit with cardiology,continue Metoprolol  25 mg daily, spironolactone  25 mg daily Entresto  24-26 mg 1 tablet twice daily - Advise on heart-healthy diet: low salt, low fat. - Recommend 30 minutes of moderate exercise as tolerated.      03/12/2024   11:35 AM 03/12/2024   10:55 AM 02/04/2024    2:54 PM 02/04/2024    1:13 PM 12/10/2023    3:06 PM 12/10/2023    2:54 PM 12/09/2023    3:50 PM  BP/Weight  Systolic BP 132 130 124 102 153 156 150  Diastolic BP 83 80 80 70 97 93 90  Wt. (Lbs)  233 217.6 217.6  233 237  BMI  28.36 kg/m2 26.49 kg/m2 26.49 kg/m2  28.36 kg/m2 28.85 kg/m2           Bilateral pulmonary embolism (HCC)   Continue Eliquis  5 mg twice daily      Chronic heart failure with preserved ejection fraction (HFpEF) (HCC)   re.      Relevant Orders   CBC   Basic Metabolic Panel   Atrial fibrillation (HCC)   Atrial Fibrillation and Chronic Heart Failure with Preserved Ejection Fraction Intermittent chest pain and shortness of breath reported. Blood pressure well-controlled. Continues on Apixaban , Metoprolol , and Spironolactone . - Encourage maintaining close follow-up with cardiologist Dr. Zenaida. Continue metoprolol  25 mg daily, Eliquis  5 mg twice daily         Endocrine   Hyperthyroidism   Continue methimazole  15 mg daily Maintain  close follow-up with endocrinology        Other   Normocytic anemia   Lab Results  Component Value Date   WBC 4.8 12/09/2023   HGB 12.1 (L) 12/09/2023   HCT 35.3 (L) 12/09/2023   MCV 86.5 12/09/2023   PLT 242 12/09/2023  Rechecking CBC      Anxiety and depression   Anxiety and Depression Declined to discuss anxiety and depression. No interest in counseling or therapy. No thoughts of self-harm reported. - Offer to discuss medications or therapy if interested    .      Chronic pain of left ankle - Primary    Left Ankle Pain, Status Post Remote Fracture and Ligament Injury Chronic left  ankle pain exacerbated by walking. No current swelling. Pain occurs with pressure during walking. - Refer to orthopedics for evaluation and potential x-ray. - Recommend Tylenol  650 mg every 6 hours as needed for pain. - Advise wearing comfortable shoes.        Relevant Orders   Ambulatory referral to Orthopedic Surgery   History of stroke   History of stroke not on a statin checking lipid panel Lab Results  Component Value Date   CHOL 103 10/20/2021   HDL 33 (L) 10/20/2021   LDLCALC 65 10/20/2021   TRIG 115 07/27/2023   CHOLHDL 3.1 10/20/2021         Relevant Orders   Lipid panel    No orders of the defined types were placed in this encounter.   Follow-up: Return in about 4 months (around 07/10/2024) for  CPE, .    Kiriana Worthington R Rachard Isidro, FNP

## 2024-03-12 NOTE — Assessment & Plan Note (Signed)
 History of stroke not on a statin checking lipid panel Lab Results  Component Value Date   CHOL 103 10/20/2021   HDL 33 (L) 10/20/2021   LDLCALC 65 10/20/2021   TRIG 115 07/27/2023   CHOLHDL 3.1 10/20/2021

## 2024-03-15 ENCOUNTER — Telehealth (HOSPITAL_COMMUNITY): Payer: Self-pay

## 2024-03-15 ENCOUNTER — Other Ambulatory Visit (HOSPITAL_COMMUNITY): Payer: Self-pay

## 2024-03-15 NOTE — Telephone Encounter (Signed)
 Advanced Heart Failure Patient Advocate Encounter  Received prior authorization notification from [facility]. Test billing indicates that this plan prefers brand Entresto  (DAW 9). Spoke to pharmacy to reprocess, confirmed $4 copay. No prior auth submitted at this time.  Rachel DEL, CPhT Rx Patient Advocate Phone: [phone]

## 2024-03-17 ENCOUNTER — Ambulatory Visit: Payer: Self-pay

## 2024-03-17 ENCOUNTER — Encounter: Payer: Self-pay | Admitting: Nurse Practitioner

## 2024-03-17 ENCOUNTER — Ambulatory Visit (INDEPENDENT_AMBULATORY_CARE_PROVIDER_SITE_OTHER): Admitting: Nurse Practitioner

## 2024-03-17 VITALS — BP 128/68 | HR 68 | Wt 235.0 lb

## 2024-03-17 DIAGNOSIS — I5032 Chronic diastolic (congestive) heart failure: Secondary | ICD-10-CM

## 2024-03-17 DIAGNOSIS — I4891 Unspecified atrial fibrillation: Secondary | ICD-10-CM | POA: Diagnosis not present

## 2024-03-17 DIAGNOSIS — Z13220 Encounter for screening for lipoid disorders: Secondary | ICD-10-CM | POA: Diagnosis not present

## 2024-03-17 DIAGNOSIS — R0602 Shortness of breath: Secondary | ICD-10-CM | POA: Insufficient documentation

## 2024-03-17 NOTE — Assessment & Plan Note (Addendum)
 Offered EKG due to complaints of intermittent chest pain but patient declined Continue metoprolol  25 mg daily, Eliquis  5 mg twice daily

## 2024-03-17 NOTE — Assessment & Plan Note (Signed)
 Chronic heart failure suboptimal spironolactone  dosing, causing edema and weight gain. - Instructed to take full dose of spironolactone  25 mg daily, continue metoprolol  25 mg daily, Entresto  24-26 mg twice daily - Advised to contact cardiology if symptoms does not improve - Recommended ER visit but the patient declined - Ordered EKG to assess for arrhythmias since he complained of chest pain but he declined Unable to obtain labs today because the lab is closed, he will return for labs tomorrow

## 2024-03-17 NOTE — Addendum Note (Signed)
 Addended by: JUANICE NIPPLE R on: 03/17/2024 06:00 PM   Modules accepted: Orders

## 2024-03-17 NOTE — Progress Notes (Addendum)
 Acute Office Visit  Subjective:     Patient ID: Aaron [name], male    DOB: 11-Sep-1985, 38 y.o.   MRN: [id number]  Chief Complaint  Patient presents with   Edema    With sob yesterday    HPI   Discussed the use of AI scribe software for clinical note transcription with the patient, who gave verbal consent to proceed.  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  He has experienced increased swelling in his hands, feet, and abdomen , cough ,since his last visit on Friday.  this swelling is associated with a weight gain of two pounds since Friday, with a total increase from 217 pounds on September 24 to 235 pounds today.  He is currently taking spironolactone  25 mg daily, but has only been taking half of the prescribed dose. Additionally, he is on Entresto  one tablet twice a day and metoprolol  25 mg daily.  He denies any current chest pain but has experienced shortness of breath. He  sees  cardiologist, with his next appointment scheduled for December.    No complaints of fever, chills abdominal pain nausea vomiting  Assessment & Plan       Wt Readings from Last 3 Encounters:  03/17/24 235 lb (106.6 kg)  03/12/24 233 lb (105.7 kg)  02/04/24 217 lb 9.6 oz (98.7 kg)     Review of Systems  Constitutional:  Negative for appetite change, chills, fatigue and fever.  HENT:  Negative for congestion, postnasal drip, rhinorrhea and sneezing.   Respiratory:  Positive for cough. Negative for shortness of breath and  wheezing.   Cardiovascular:  Positive for chest pain and leg swelling. Negative for palpitations.  Gastrointestinal:  Negative for abdominal pain, constipation, nausea and vomiting.  Genitourinary:  Negative for difficulty urinating, dysuria, flank pain and frequency.  Musculoskeletal:  Negative for back pain, joint swelling and myalgias.  Skin:  Negative for color change, pallor, rash and wound.  Neurological:  Negative for dizziness, facial asymmetry, weakness and headaches.  Psychiatric/Behavioral:  Negative for behavioral problems, confusion, self-injury and suicidal ideas.         Objective:    BP 128/68   Pulse 68   Wt 235 lb (106.6 kg)   SpO2 98%   BMI 28.61 kg/m    Physical Exam Vitals and nursing note reviewed.  Constitutional:      General: He is not in acute distress.    Appearance: Normal appearance. He is not ill-appearing, toxic-appearing or diaphoretic.  HENT:     Mouth/Throat:     Mouth: Mucous membranes are moist.     Pharynx: Oropharynx is clear. No oropharyngeal exudate or posterior oropharyngeal erythema.  Eyes:     General: No scleral icterus.       Right eye: No discharge.        Left eye: No discharge.     Extraocular Movements: Extraocular movements intact.     Conjunctiva/sclera: Conjunctivae normal.  Cardiovascular:     Rate and Rhythm: Normal rate and regular rhythm.     Pulses: Normal pulses.     Heart sounds: Normal heart sounds. No  murmur heard.    No friction rub. No gallop.  Pulmonary:     Effort: Pulmonary effort is normal. No respiratory distress.     Breath sounds: Normal breath sounds. No stridor. No wheezing, rhonchi or rales.  Chest:     Chest wall: No tenderness.  Abdominal:     General: There is distension.     Palpations: Abdomen is soft.     Tenderness: There is no abdominal tenderness. There is no right CVA tenderness, left CVA tenderness or guarding.  Musculoskeletal:        General: No swelling, tenderness, deformity or  signs of injury.     Right lower leg: Edema present.     Left lower leg: Edema present.     Comments: Nonpitting edema bilateral lower extremities  Skin:    General: Skin is warm and dry.     Capillary Refill: Capillary refill takes 2 to 3 seconds.     Coloration: Skin is not jaundiced or pale.     Findings: No bruising, erythema or lesion.  Neurological:     Mental Status: He is alert and oriented to person, place, and time.     Motor: No weakness.     Coordination: Coordination normal.     Gait: Gait normal.  Psychiatric:        Mood and Affect: Mood normal.        Behavior: Behavior normal.        Thought Content: Thought content normal.        Judgment: Judgment normal.     No results found for any visits on 03/17/24.      Assessment & Plan:   Problem List Items Addressed This Visit       Cardiovascular and Mediastinum   Chronic heart failure with preserved ejection fraction (HFpEF) (HCC)   Chronic heart failure suboptimal spironolactone  dosing, causing edema and weight gain. - Instructed to take full dose of spironolactone  25 mg daily, continue metoprolol  25 mg daily, Entresto  24-26 mg twice daily - Advised to contact cardiology if symptoms does not improve - Recommended ER visit but the patient declined - Ordered EKG to assess for arrhythmias since he complained of chest pain but he declined Unable to obtain labs today because the lab is closed, he will return for labs tomorrow        Relevant Orders   Lipid panel   Atrial fibrillation (HCC)   Offered EKG due to complaints of intermittent chest pain but patient declined Continue metoprolol  25 mg daily, Eliquis  5 mg twice daily        Other   Shortness of breath - Primary   Could be experiencing CHF exacerbation but it patient refused going to the emergency room Checking BNP tomorrow Continue current medications daily daily as ordered and follow-up with cardiology      Relevant Orders   Brain  natriuretic peptide   CBC   CMP14+EGFR   DG Chest 2 View   Other Visit Diagnoses       Screening for lipid disorders       Relevant Orders   Lipid panel       No orders of the defined types were placed in this encounter.   No follow-ups on file.  Smiley Birr R Lexys Milliner, FNP

## 2024-03-17 NOTE — Telephone Encounter (Signed)
 FYI Only or Action Required?: FYI only for provider: ED advised.  Patient was last seen in primary care on 03/12/2024 by Paseda, Folashade R, FNP.  Called Nurse Triage reporting Leg Swelling.  Symptoms began yesterday.  Interventions attempted: Nothing.  Symptoms are: unchanged.  Triage Disposition: Go to ED Now (or PCP Triage)  Patient/caregiver understands and will follow disposition?: No, refuses disposition      Copied from CRM [id number]. Topic: Clinical - Red Word Triage >> Mar 17, 2024 11:54 AM Tinnie BROCKS wrote: Red Word that prompted transfer to Nurse Triage: Fiance robin on the line concerned that he is retaining fluid, looks swollen in hands/feet/belly. He went from 233 lbs to 250 lbs in a short amount of time, but she says the scale may be broken. Doesn't know if he needs to change meds or have different bloodwork ordered. Has lab appt tomorrow. Reason for Disposition  Patient sounds very sick or weak to the triager  Answer Assessment - Initial Assessment Questions Pt has significant cardiac history. GF just wanted to see if they could add a lab to check for swelling because she states he has not been taking his water pill and he has swelling on his abdomen, hands, legs and feet. She states he has been coughing more. She states he had him weigh himself yesterday and he was over 250 and he was 233 on Friday. RN advised pt to to go to ER due to increased weight and coughing. She states she is going to be honest and since he has an appt tomorrow he won't go to the Er. RN advised it is just for labs. She states she knows he won't go. RN called [facility] they stated they could see him today. Office staff calling Grayce back to discuss.       1. ONSET: When did the swelling start? (e.g., minutes, hours, days)     She noticed it yesterday 2. LOCATION: What part of the leg is swollen?  Are both legs swollen or just one leg?     Both legs, feet, hands, abdomen 3. SEVERITY: How bad  is the swelling? (e.g., localized; mild, moderate, severe)     severe 4. REDNESS: Is there redness or signs of infection?     no  8. MEDICAL HISTORY: Do you have a history of blood clots (e.g., DVT), cancer, heart failure, kidney disease, or liver failure?     Significant cardiac history  10. OTHER SYMPTOMS: Do you have any other symptoms? (e.g., chest pain, difficulty breathing)       Increased cough  Protocols used: Leg Swelling and Edema-A-AH

## 2024-03-17 NOTE — Patient Instructions (Addendum)
 Please consider going to the hospital as discussed Take your medications as ordered

## 2024-03-17 NOTE — Assessment & Plan Note (Signed)
 Could be experiencing CHF exacerbation but it patient refused going to the emergency room Checking BNP tomorrow Continue current medications daily daily as ordered and follow-up with cardiology

## 2024-03-18 ENCOUNTER — Other Ambulatory Visit: Payer: Self-pay

## 2024-03-18 DIAGNOSIS — I5032 Chronic diastolic (congestive) heart failure: Secondary | ICD-10-CM | POA: Diagnosis not present

## 2024-03-18 DIAGNOSIS — Z13220 Encounter for screening for lipoid disorders: Secondary | ICD-10-CM | POA: Diagnosis not present

## 2024-03-18 DIAGNOSIS — R0602 Shortness of breath: Secondary | ICD-10-CM

## 2024-03-18 NOTE — Telephone Encounter (Signed)
 Pt was advised concerning x ray and was seen by provider yesterday. KH

## 2024-03-19 ENCOUNTER — Ambulatory Visit: Payer: Self-pay | Admitting: Nurse Practitioner

## 2024-03-19 ENCOUNTER — Other Ambulatory Visit (HOSPITAL_COMMUNITY): Payer: Self-pay

## 2024-03-19 DIAGNOSIS — R0602 Shortness of breath: Secondary | ICD-10-CM

## 2024-03-19 DIAGNOSIS — E785 Hyperlipidemia, unspecified: Secondary | ICD-10-CM

## 2024-03-19 LAB — CBC
Hematocrit: 44.7 % (ref 37.5–51.0)
Hemoglobin: 14.5 g/dL (ref 13.0–17.7)
MCH: 30 pg (ref 26.6–33.0)
MCHC: 32.4 g/dL (ref 31.5–35.7)
MCV: 93 fL (ref 79–97)
Platelets: 235 x10E3/uL (ref 150–450)
RBC: 4.83 x10E6/uL (ref 4.14–5.80)
RDW: 13.1 % (ref 11.6–15.4)
WBC: 5 x10E3/uL (ref 3.4–10.8)

## 2024-03-19 LAB — PRO B NATRIURETIC PEPTIDE: NT-Pro BNP: 938 pg/mL — ABNORMAL HIGH (ref 0–86)

## 2024-03-19 LAB — CMP14+EGFR
ALT: 20 IU/L (ref 0–44)
AST: 19 IU/L (ref 0–40)
Albumin: 4.5 g/dL (ref 4.1–5.1)
Alkaline Phosphatase: 103 IU/L (ref 47–123)
BUN/Creatinine Ratio: 16 (ref 9–20)
BUN: 14 mg/dL (ref 6–20)
Bilirubin Total: 1 mg/dL (ref 0.0–1.2)
CO2: 25 mmol/L (ref 20–29)
Calcium: 9.1 mg/dL (ref 8.7–10.2)
Chloride: 103 mmol/L (ref 96–106)
Creatinine, Ser: 0.89 mg/dL (ref 0.76–1.27)
Globulin, Total: 2.7 g/dL (ref 1.5–4.5)
Glucose: 85 mg/dL (ref 70–99)
Potassium: 4.1 mmol/L (ref 3.5–5.2)
Sodium: 141 mmol/L (ref 134–144)
Total Protein: 7.2 g/dL (ref 6.0–8.5)
eGFR: 112 mL/min/1.73 (ref >59)

## 2024-03-19 LAB — LIPID PANEL
Chol/HDL Ratio: 3.2 ratio (ref 0.0–5.0)
Cholesterol, Total: 190 mg/dL (ref 100–199)
HDL: 59 mg/dL (ref >39)
LDL Chol Calc (NIH): 121 mg/dL — ABNORMAL HIGH (ref 0–99)
Triglycerides: 50 mg/dL (ref 0–149)
VLDL Cholesterol Cal: 10 mg/dL (ref 5–40)

## 2024-03-19 MED ORDER — ROSUVASTATIN CALCIUM 10 MG PO TABS: 10.0000 mg | ORAL_TABLET | Freq: Every day | ORAL | 1 refills | Status: AC

## 2024-03-19 MED ORDER — FUROSEMIDE 20 MG PO TABS: 20.0000 mg | ORAL_TABLET | Freq: Every day | ORAL | 0 refills | Status: DC

## 2024-03-19 NOTE — Telephone Encounter (Signed)
 Copied from CRM #[id number]. Topic: Clinical - Lab/Test Results >> Mar 19, 2024  2:55 PM Avram MATSU wrote: Reason for CRM: Grayce is con pt emergency contact list and stated he missed a call about his lab results, please advise [phone]

## 2024-04-05 ENCOUNTER — Ambulatory Visit (INDEPENDENT_AMBULATORY_CARE_PROVIDER_SITE_OTHER): Admitting: Endocrinology

## 2024-04-05 ENCOUNTER — Encounter: Payer: Self-pay | Admitting: Endocrinology

## 2024-04-05 ENCOUNTER — Other Ambulatory Visit

## 2024-04-05 VITALS — BP 110/60 | HR 68 | Resp 16 | Ht 76.0 in | Wt 231.8 lb

## 2024-04-05 DIAGNOSIS — H05839 Thyroid orbitopathy, unspecified orbit: Secondary | ICD-10-CM | POA: Diagnosis not present

## 2024-04-05 DIAGNOSIS — E0500 Thyrotoxicosis with diffuse goiter without thyrotoxic crisis or storm: Secondary | ICD-10-CM | POA: Diagnosis not present

## 2024-04-05 NOTE — Progress Notes (Signed)
 Outpatient Endocrinology Note Aaron Nudd, MD   Patient's Name: Aaron [name]    DOB: 04/27/1986    MRN: [id number]  REASON OF VISIT: Follow-up for hyperthyroidism Aaron [name].  REFERRING PROVIDER: Zenaida Morene PARAS, MD  PCP: Paseda, Folashade R, FNP  HISTORY OF PRESENT ILLNESS:   Aaron [name] is a 38 y.o. old male with past medical history as listed below is presented for follow-up for hyperthyroidism / Graves' [name].  Patient is accompanied by fianc in the clinic today.  Pertinent Thyroid  History: Patient was diagnosed with hyperthyroidism/Graves' [name] with elevated thyrotropin receptor antibody in June 2023.  At the time of diagnosis she had a suppressed TSH with elevated free T3 of 19.8 and elevated free T4 of 4.55.  TRAb was 25.  He was started on methimazole  he had taken methimazole  up to 30 mg daily.  He reports he was not fully compliant in the past lately he has been compliant and taking methimazole  every day.  At least from May 2025 he has been taking methimazole  10 mg daily.  Overall he has improvement on hyperthyroidism, however is still not normal.  Thyroid  function test from December 09, 2023 with low TSH of 0.015, elevated free T3 of 5.3 and elevated free T4 of 1.44.  Patient has complaints of intermittent palpitation, cold and heat intolerance, denies bowel movement problem.  He has complaints of neck discomfort.  He reports fluctuating body weight.  He has complaints of watering and occasional redness of the eyes.  He has been on metoprolol  XL 25 mg daily, from cardiology.  He has significant heart condition with primary mitral regurgitation with reduced ejection fraction, CHF, atrial fibrillation, and cardiac arrest following with cardiology.  He reports family history of multiple family members with thyroid  disorder however does not know his specific.  Methimazole  was increased to 15 mg daily from 10 mg in September, 2025 due to elevated thyroid   hormone  levels.    Latest Reference Range & Units 10/21/21 15:48  Thyrotropin Receptor Ab 0.00 - 1.75 IU/L 25.00 (H)  (H): Data is abnormally high  He had ultrasound thyroid  in June 2023 reviewed images overall heterogenous enlarged thyroid  parenchyma and a small mostly hypoechoic multiple nodules, consistent with chronic inflammatory thyroid  disorder.  Interval history  Patient has been taking methimazole  15 mg daily, reports compliance.  Complains of occasional palpitation.  No recent illness or fever.  No GI issues.  No neck discomfort.  He is accompanied by fianc in the clinic today.  REVIEW OF SYSTEMS:  As per history of present illness.   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   Hyperthyroidism    Mitral regurgitation    NSTEMI (non-ST elevated myocardial infarction) [facility])     PAST SURGICAL HISTORY: Past Surgical History:  Procedure Laterality Date   ECMO CANNULATION N/A 07/10/2023   Procedure: ECMO CANNULATION;  Surgeon: Cherrie Toribio SAUNDERS, MD;  Location: [facility] INVASIVE CV LAB;  Service: Cardiovascular;  Laterality: N/A;   EMBOLECTOMY Left 07/18/2023   Procedure: EMBOLECTOMY Left Femoral, Popliteal and iliac arterys,  Repair Left common femoral artery.;  Surgeon: Lanis Fonda BRAVO, MD;  Location: [facility] OR;  Service: Vascular;  Laterality: Left;   INTRAOPERATIVE TRANSESOPHAGEAL ECHOCARDIOGRAM  N/A 07/23/2023   Procedure: ECHOCARDIOGRAM, TRANSESOPHAGEAL, INTRAOPERATIVE;  Surgeon: Kerrin Elspeth BROCKS, MD;  Location: [facility] OR;  Service: Open Heart Surgery;  Laterality: N/A;   IR FLUORO GUIDE CV LINE RIGHT  08/08/2023   IR REMOVAL TUN CV CATH W/O FL  08/14/2023   IR US  GUIDE VASC ACCESS RIGHT  08/08/2023    LEFT HEART CATH AND CORONARY ANGIOGRAPHY N/A 10/22/2021   Procedure: LEFT HEART CATH AND CORONARY ANGIOGRAPHY;  Surgeon: Mady Bruckner, MD;  Location: [facility] INVASIVE CV LAB;  Service: Cardiovascular;  Laterality: N/A;   PLACEMENT OF IMPELLA LEFT VENTRICULAR ASSIST DEVICE Right 07/14/2023   Procedure: PLACEMENT OF RIGHT AXILLARY IMPELLA 5.5 LEFT VENTRICULAR ASSIST DEVICE;  Surgeon: Kerrin Elspeth BROCKS, MD;  Location: [facility] OR;  Service: Open Heart Surgery;  Laterality: Right;   REMOVAL OF IMPELLA LEFT VENTRICULAR ASSIST DEVICE Right 07/14/2023   Procedure: REMOVAL OF CP RIGHT FEMORAL IMPELLA LEFT VENTRICULAR ASSIST DEVICE;  Surgeon: Kerrin Elspeth BROCKS, MD;  Location: [facility] OR;  Service: Open Heart Surgery;  Laterality: Right;   REMOVAL OF IMPELLA LEFT VENTRICULAR ASSIST DEVICE N/A 07/23/2023   Procedure: REMOVAL, CARDIAC ASSIST DEVICE, IMPELLA;  Surgeon: Kerrin Elspeth BROCKS, MD;  Location: [facility] OR;  Service: Open Heart Surgery;  Laterality: N/A;   TRANSCATHETER MITRAL EDGE TO EDGE REPAIR N/A 07/16/2023   Procedure: TRANSCATHETER MITRAL EDGE TO EDGE REPAIR;  Surgeon: Wonda Sharper, MD;  Location: [facility] INVASIVE CV LAB;  Service: Cardiovascular;  Laterality: N/A;   TRANSESOPHAGEAL ECHOCARDIOGRAM (CATH LAB) N/A 07/16/2023   Procedure: TRANSESOPHAGEAL ECHOCARDIOGRAM;  Surgeon: Wonda Sharper, MD;  Location: [facility] INVASIVE CV LAB;  Service: Cardiovascular;  Laterality: N/A;   VENTRICULAR ASSIST DEVICE INSERTION N/A 07/10/2023   Procedure: VENTRICULAR ASSIST DEVICE INSERTION;  Surgeon: Cherrie Toribio SAUNDERS, MD;  Location: [facility] INVASIVE CV LAB;  Service: Cardiovascular;  Laterality: N/A;    ALLERGIES: No Known Allergies  FAMILY HISTORY:  Family History  Problem Relation Age of Onset   Heart [name] Other     SOCIAL HISTORY: Social History   Socioeconomic History   Marital status: Single    Spouse name: Not on file   Number of children: Not on file   Years of education: Not on file   Highest education level:  Some college, no degree  Occupational History   Not on file  Tobacco Use   Smoking status: Never   Smokeless tobacco: Never  Vaping Use   Vaping status: Never Used  Substance and Sexual Activity   Alcohol use: No   Drug use: Yes    Frequency: 3.0 times per week    Types: Marijuana   Sexual activity: Never  Other Topics Concern   Not on file  Social History Narrative   Lives with fiance   Social Drivers of Health   Financial Resource Strain: Patient Declined (03/12/2024)   Overall Financial Resource Strain (CARDIA)    Difficulty of Paying Living Expenses: Patient declined  Food Insecurity: Patient Declined (03/12/2024)   Hunger Vital Sign    Worried About Running Out of Food in the Last Year: Patient declined    Ran Out of Food in the Last Year: Patient declined  Transportation Needs: No Transportation Needs (03/12/2024)   PRAPARE - Administrator, Civil Service (Medical): No    Lack of Transportation (Non-Medical): No  Physical Activity: Insufficiently Active (03/12/2024)   Exercise Vital Sign    Days of Exercise per Week: 4 days    Minutes of Exercise per Session: 20 min  Stress: Stress Concern Present (03/12/2024)   [employer] of Occupational Health - Occupational Stress Questionnaire    Feeling of Stress: Rather much  Social Connections: Socially Isolated (03/12/2024)   Social Connection and Isolation Panel    Frequency of Communication with Friends and Family: Never    Frequency of Social Gatherings with Friends and Family: Never    Attends Religious Services: Never    Database Administrator or Organizations: No    Attends Engineer, Structural: Not on file    Marital Status: Living with partner    MEDICATIONS:  Current Outpatient Medications  Medication Sig Dispense Refill   apixaban  (ELIQUIS ) 5 MG TABS tablet Take 1 tablet (5 mg total) by mouth 2 (two) times daily. 60 tablet 0   ascorbic acid  (VITAMIN C ) 500 MG tablet Take 0.5  tablets (250 mg total) by mouth 2 (two) times daily. 60 tablet 0   furosemide  (LASIX ) 20 MG tablet Take 1 tablet (20 mg total) by mouth daily. 5 tablet 0   methimazole  (TAPAZOLE ) 5 MG tablet Take 3 tablets (15 mg total) by mouth daily. 270 tablet 3   metoprolol  succinate (TOPROL  XL) 25 MG 24 hr tablet Take 1 tablet (25 mg total) by mouth daily. 90 tablet 3   rosuvastatin  (CRESTOR ) 10 MG tablet Take 1 tablet (10 mg total) by mouth daily. 90 tablet 1   sacubitril -valsartan  (ENTRESTO ) 24-26 MG Take 1 tablet by mouth 2 (two) times daily. 180 tablet 3   spironolactone  (ALDACTONE ) 25 MG tablet Take 25 mg by mouth daily.     No current facility-administered medications for this visit.    PHYSICAL EXAM: Vitals:   04/05/24 1405  BP: 110/60  Pulse: 68  Resp: 16  SpO2: 98%  Weight: 231 lb 12.8 oz (105.1 kg)  Height: 6' 4 (1.93 m)   Body mass index is 28.22 kg/m.  Wt Readings from Last 3 Encounters:  04/05/24 231 lb 12.8 oz (105.1 kg)  03/17/24 235 lb (106.6 kg)  03/12/24 233 lb (105.7 kg)    General: Well developed, well nourished male in no apparent distress.  HEENT: AT/[place], no external lesions. Hearing intact to the spoken word Eyes: EOMI. No stare. Mild proptosis. Conjunctiva clear and no icterus. No erythema or watering Neck: Trachea midline, neck supple with appreciable thyromegaly 2X or no lymphadenopathy and no palpable thyroid  nodules Lungs: Clear to auscultation, no wheeze. Respirations not labored Heart: S1S2, Regular in rate and rhythm.  Abdomen: Soft, non tender, non distended Neurologic: Alert, oriented, normal speech, deep tendon biceps reflexes normal,  no gross focal neurological deficit Extremities: No pedal pitting edema, no tremors of outstretched hands Skin: Warm, color good.  Psychiatric: Does not appear depressed or anxious  PERTINENT HISTORIC LABORATORY AND IMAGING STUDIES:  All pertinent laboratory results were reviewed. Please see HPI also for further details.    TSH  Date Value Ref Range Status  02/04/2024 <0.01 (L) 0.40 - 4.50 mIU/L Final  12/09/2023 0.015 (L) 0.350 - 4.500 uIU/mL Final    Comment:    Performed by a 3rd Generation assay with a functional sensitivity of <=0.01 uIU/mL. Performed at [facility], [address] [place], [place] [address]   07/25/2023 0.033 (L) 0.350 - 4.500 uIU/mL Final    Comment:    Performed by a 3rd Generation assay with a functional sensitivity of <=0.01 uIU/mL. Performed at [facility], [address] [place], [place] [address]     Lab Results  Component Value Date   FREET4 2.3 (H) 02/04/2024   FREET4 1.44 (H) 12/09/2023   FREET4 0.26 (L) 07/25/2023   T3FREE 7.7 (H) 02/04/2024   T3FREE 5.3 (H) 12/09/2023   T3FREE 4.2 07/11/2023   TSH <0.01 (L) 02/04/2024   TSH 0.015 (L) 12/09/2023   TSH 0.033 (L) 07/25/2023    Lab Results  Component Value Date   THYROTRECAB 25.00 (H) 10/21/2021    Lab Results  Component Value Date   TSH <0.01 (L) 02/04/2024   TSH 0.015 (L) 12/09/2023   TSH 0.033 (L) 07/25/2023   FREET4 2.3 (H) 02/04/2024   FREET4 1.44 (H) 12/09/2023   FREET4 0.26 (L) 07/25/2023     Lab Results  Component Value Date   TSI 14.80 (H) 10/20/2021     No components found for: TRAB     Latest Reference Range & Units 02/04/24 14:05  TRAB <=2.00 IU/L 23.32 (H)  (H): Data is abnormally high  ASSESSMENT / PLAN  1. Graves [name]   2. 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  He had elevated TRAb.  He was diagnosed in June 2023.  He has been on methimazole  was not fully compliant in the past however has been reportedly compliant, currently taking methimazole  15 mg daily.  Dose was increased at last visit.  He has also been taking metoprolol  metoprolol  XL 25 mg daily.  He has significant heart [name] with atrial fibrillation, CHF, following with cardiology.    The three options of therapy for hyperthyroidism were discussed with  the patient, including thionamide drug therapy, thyroidectomy, and radioactive iodine ablation. I reviewed the possible complications of rash, agranulocytosis, or liver dysfunction associated with anti-thyroid  therapies. I reviewed the risks of hemorrhage, hypocalcemia, hoarseness and hypothyroidism after thyroidectomy. Regarding radioactive iodine ablation, I informed the patient that most patients treated with radioactive iodine can be cured of hyperthyroidism with a single dose, but to about 15-20% may require an additional dose. I emphasized that most patients treated with radioactive iodine develop permanent hypothyroidism, requiring lifelong replacement with thyroid  hormone. I discussed the rare occurrence of transient increase in thyroid  hormone levels after radioactive iodine and associated with symptoms, possible worsening of Grave's eye [name], and the likely small but not insignificant risks associated with radiation exposure of this kind.   At this time we will treat with antithyroid medication.  He has Graves' eye [name] as well, not active at this time.  Referred to ophthalmology.  Due to his Graves' eye [name], radioactive iodine treatment is not a preferred permanent treatment.  He has significant heart conditions including CHF and atrial fibrillation and mitral regurgitation, if he does not have reasonable control of thyroid  hormone levels with antithyroid medication and if compliance can be issue we will plan for permanent treatment option.  Discussed that overactive thyroid  can aggravate his current heart conditions.  Plan: -He is currently taking methimazole  15 mg daily.  Will check thyroid  function test today and adjust the dose of methimazole  as needed. -Continue metoprolol  as per cardiology currently on 25 mg daily. - He needs close endocrinology follow-up.  Diagnoses and all orders for this visit:  Graves [name] -     T4, free -     T3, free -     TSH  Graves' eye  [name]     DISPOSITION Follow up in clinic in 3 months suggested.  All questions answered and patient verbalized understanding of the plan.  Hadyn Azer,  MD [facility] Endocrinology [facility] [address] [place], [address] [place], [place] [address] Phone # [phone]   At least part of this note was generated using voice recognition software. Inadvertent word errors may have occurred, which were not recognized during the proofreading process.

## 2024-04-06 ENCOUNTER — Ambulatory Visit: Payer: Self-pay | Admitting: Endocrinology

## 2024-04-06 LAB — TSH: TSH: <0.01 m[IU]/L — ABNORMAL LOW (ref 0.40–4.50)

## 2024-04-06 LAB — T4, FREE: Free T4: 1.5 ng/dL (ref 0.8–1.8)

## 2024-04-06 LAB — T3, FREE: T3, Free: 4.5 pg/mL — ABNORMAL HIGH (ref 2.3–4.2)

## 2024-04-12 ENCOUNTER — Encounter (HOSPITAL_COMMUNITY): Payer: Self-pay

## 2024-04-15 ENCOUNTER — Ambulatory Visit (HOSPITAL_COMMUNITY)
Admission: RE | Admit: 2024-04-15 | Discharge: 2024-04-15 | Disposition: A | Source: Ambulatory Visit | Attending: Cardiology | Admitting: Cardiology

## 2024-04-15 DIAGNOSIS — R079 Chest pain, unspecified: Secondary | ICD-10-CM | POA: Insufficient documentation

## 2024-04-15 DIAGNOSIS — I5032 Chronic diastolic (congestive) heart failure: Secondary | ICD-10-CM | POA: Insufficient documentation

## 2024-04-15 MED ORDER — NITROGLYCERIN 0.4 MG SL SUBL
0.8000 mg | SUBLINGUAL_TABLET | Freq: Once | SUBLINGUAL | Status: AC
  Administered 2024-04-15: 0.8 mg via SUBLINGUAL

## 2024-04-15 MED ORDER — IOHEXOL 350 MG/ML SOLN
100.0000 mL | Freq: Once | INTRAVENOUS | Status: AC | PRN
  Administered 2024-04-15: 100 mL via INTRAVENOUS

## 2024-04-21 ENCOUNTER — Ambulatory Visit (HOSPITAL_COMMUNITY): Payer: Self-pay | Admitting: Cardiology

## 2024-04-29 ENCOUNTER — Telehealth (HOSPITAL_COMMUNITY): Payer: Self-pay | Admitting: Cardiology

## 2024-04-29 NOTE — Telephone Encounter (Signed)
 Called to confirm/remind patient of their appointment at [facility] on 04/29/24.   Appointment:   [x] Confirmed  [] Left mess   [] No answer/No voice mail  [] VM Full/unable to leave message  [] Phone not in service  Patient reminded to bring all medications and/or complete list.  Confirmed patient has transportation. Gave directions, instructed to utilize valet parking.

## 2024-04-30 ENCOUNTER — Other Ambulatory Visit: Payer: Self-pay

## 2024-04-30 ENCOUNTER — Emergency Department (HOSPITAL_COMMUNITY): Admission: EM | Admit: 2024-04-30 | Discharge: 2024-04-30 | Discharge status: Against Medical Advice

## 2024-04-30 ENCOUNTER — Ambulatory Visit (HOSPITAL_COMMUNITY)
Admission: RE | Admit: 2024-04-30 | Discharge: 2024-04-30 | Disposition: A | Discharge status: Home / Self Care | Source: Ambulatory Visit | Attending: Cardiology | Admitting: Cardiology

## 2024-04-30 ENCOUNTER — Encounter (HOSPITAL_COMMUNITY): Payer: Self-pay | Admitting: *Deleted

## 2024-04-30 ENCOUNTER — Other Ambulatory Visit: Payer: Self-pay | Admitting: Nurse Practitioner

## 2024-04-30 ENCOUNTER — Ambulatory Visit (HOSPITAL_COMMUNITY): Payer: Self-pay | Admitting: Cardiology

## 2024-04-30 ENCOUNTER — Encounter (HOSPITAL_COMMUNITY): Payer: Self-pay | Admitting: Cardiology

## 2024-04-30 VITALS — BP 102/70 | HR 138 | Ht 76.0 in | Wt 229.6 lb

## 2024-04-30 DIAGNOSIS — I5042 Chronic combined systolic (congestive) and diastolic (congestive) heart failure: Secondary | ICD-10-CM | POA: Diagnosis not present

## 2024-04-30 DIAGNOSIS — I340 Nonrheumatic mitral (valve) insufficiency: Secondary | ICD-10-CM | POA: Diagnosis not present

## 2024-04-30 DIAGNOSIS — Z5321 Procedure and treatment not carried out due to patient leaving prior to being seen by health care provider: Secondary | ICD-10-CM | POA: Diagnosis not present

## 2024-04-30 DIAGNOSIS — I2782 Chronic pulmonary embolism: Secondary | ICD-10-CM

## 2024-04-30 DIAGNOSIS — R0602 Shortness of breath: Secondary | ICD-10-CM

## 2024-04-30 DIAGNOSIS — Z95818 Presence of other cardiac implants and grafts: Secondary | ICD-10-CM | POA: Diagnosis not present

## 2024-04-30 DIAGNOSIS — E0500 Thyrotoxicosis with diffuse goiter without thyrotoxic crisis or storm: Secondary | ICD-10-CM

## 2024-04-30 DIAGNOSIS — Z8674 Personal history of sudden cardiac arrest: Secondary | ICD-10-CM | POA: Insufficient documentation

## 2024-04-30 DIAGNOSIS — I5032 Chronic diastolic (congestive) heart failure: Secondary | ICD-10-CM

## 2024-04-30 DIAGNOSIS — Z79899 Other long term (current) drug therapy: Secondary | ICD-10-CM | POA: Insufficient documentation

## 2024-04-30 DIAGNOSIS — Z7901 Long term (current) use of anticoagulants: Secondary | ICD-10-CM | POA: Insufficient documentation

## 2024-04-30 DIAGNOSIS — R079 Chest pain, unspecified: Secondary | ICD-10-CM | POA: Insufficient documentation

## 2024-04-30 DIAGNOSIS — I480 Paroxysmal atrial fibrillation: Secondary | ICD-10-CM | POA: Insufficient documentation

## 2024-04-30 DIAGNOSIS — E0590 Thyrotoxicosis, unspecified without thyrotoxic crisis or storm: Secondary | ICD-10-CM

## 2024-04-30 DIAGNOSIS — R000 Tachycardia, unspecified: Secondary | ICD-10-CM

## 2024-04-30 DIAGNOSIS — F32A Depression, unspecified: Secondary | ICD-10-CM | POA: Diagnosis not present

## 2024-04-30 DIAGNOSIS — R0789 Other chest pain: Secondary | ICD-10-CM | POA: Diagnosis not present

## 2024-04-30 LAB — BASIC METABOLIC PANEL WITH GFR
Anion gap: 15 (ref 5–15)
BUN: 20 mg/dL (ref 6–20)
CO2: 20 mmol/L — ABNORMAL LOW (ref 22–32)
Calcium: 9.9 mg/dL (ref 8.9–10.3)
Chloride: 101 mmol/L (ref 98–111)
Creatinine, Ser: 0.79 mg/dL (ref 0.61–1.24)
GFR, Estimated: >60 mL/min
Glucose, Bld: 91 mg/dL (ref 70–99)
Potassium: 3.9 mmol/L (ref 3.5–5.1)
Sodium: 136 mmol/L (ref 135–145)

## 2024-04-30 LAB — COMPREHENSIVE METABOLIC PANEL WITH GFR
ALT: 56 U/L — ABNORMAL HIGH (ref 0–44)
AST: 43 U/L — ABNORMAL HIGH (ref 15–41)
Albumin: 4.8 g/dL (ref 3.5–5.0)
Alkaline Phosphatase: 117 U/L (ref 38–126)
Anion gap: 13 (ref 5–15)
BUN: 21 mg/dL — ABNORMAL HIGH (ref 6–20)
CO2: 22 mmol/L (ref 22–32)
Calcium: 9.9 mg/dL (ref 8.9–10.3)
Chloride: 102 mmol/L (ref 98–111)
Creatinine, Ser: 0.79 mg/dL (ref 0.61–1.24)
GFR, Estimated: >60 mL/min
Glucose, Bld: 112 mg/dL — ABNORMAL HIGH (ref 70–99)
Potassium: 3.7 mmol/L (ref 3.5–5.1)
Sodium: 137 mmol/L (ref 135–145)
Total Bilirubin: 1.4 mg/dL — ABNORMAL HIGH (ref 0.0–1.2)
Total Protein: 8.1 g/dL (ref 6.5–8.1)

## 2024-04-30 LAB — CBC
HCT: 46 % (ref 39.0–52.0)
HCT: 46.4 % (ref 39.0–52.0)
Hemoglobin: 16.8 g/dL (ref 13.0–17.0)
Hemoglobin: 16.9 g/dL (ref 13.0–17.0)
MCH: 30.1 pg (ref 26.0–34.0)
MCH: 30.5 pg (ref 26.0–34.0)
MCHC: 36.5 g/dL — ABNORMAL HIGH (ref 30.0–36.0)
MCHC: 36.4 g/dL — ABNORMAL HIGH (ref 30.0–36.0)
MCV: 82.3 fL (ref 80.0–100.0)
MCV: 83.6 fL (ref 80.0–100.0)
Platelets: 240 K/uL (ref 150–400)
Platelets: 231 K/uL (ref 150–400)
RBC: 5.59 MIL/uL (ref 4.22–5.81)
RBC: 5.55 MIL/uL (ref 4.22–5.81)
RDW: 11.4 % — ABNORMAL LOW (ref 11.5–15.5)
RDW: 11.5 % (ref 11.5–15.5)
WBC: 5 K/uL (ref 4.0–10.5)
WBC: 5.7 K/uL (ref 4.0–10.5)
nRBC: 0 % (ref 0.0–0.2)
nRBC: 0 % (ref 0.0–0.2)

## 2024-04-30 LAB — T4, FREE: Free T4: >5.5 ng/dL — ABNORMAL HIGH (ref 0.80–2.00)

## 2024-04-30 LAB — TSH: TSH: <0.1 u[IU]/mL — ABNORMAL LOW (ref 0.350–4.500)

## 2024-04-30 LAB — PRO BRAIN NATRIURETIC PEPTIDE
Pro Brain Natriuretic Peptide: 341 pg/mL — ABNORMAL HIGH
Pro Brain Natriuretic Peptide: 311 pg/mL — ABNORMAL HIGH

## 2024-04-30 LAB — TROPONIN T, HIGH SENSITIVITY: Troponin T High Sensitivity: <15 ng/L (ref 0–19)

## 2024-04-30 MED ORDER — ASPIRIN 81 MG PO CHEW
324.0000 mg | CHEWABLE_TABLET | Freq: Once | ORAL | Status: DC
  Filled 2024-04-30: qty 4

## 2024-04-30 MED ORDER — METOPROLOL SUCCINATE ER 50 MG PO TB24: 50.0000 mg | ORAL_TABLET | Freq: Every day | ORAL | 6 refills | Status: AC

## 2024-04-30 MED ORDER — NITROGLYCERIN 0.4 MG SL SUBL: 0.4000 mg | SUBLINGUAL_TABLET | SUBLINGUAL | Status: DC | PRN

## 2024-04-30 MED ORDER — OXYCODONE-ACETAMINOPHEN 5-325 MG PO TABS: 1.0000 | ORAL_TABLET | Freq: Once | ORAL | Status: DC

## 2024-04-30 NOTE — Patient Instructions (Addendum)
 Medication Changes:  INCREASE Metoprolol  XL to 50 mg Daily  Lab Work:  Labs done today, your results will be available in MyChart, we will contact you for abnormal readings.  Testing/Procedures:  Your physician has recommended that you have a cardiopulmonary stress test (CPX). CPX testing is a non-invasive measurement of heart and lung function. It replaces a traditional treadmill stress test. This type of test provides a tremendous amount of information that relates not only to your present condition but also for future outcomes. This test combines measurements of you ventilation, respiratory gas exchange in the lungs, electrocardiogram (EKG), blood pressure and physical response before, during, and following an exercise protocol. SEE INSTRUCTIONS BELOW   Special Instructions // Education:  Do the following things EVERYDAY: Weigh yourself in the morning before breakfast. Write it down and keep it in a log. Take your medicines as prescribed Eat low salt foods--Limit salt (sodium) to 2000 mg per day.  Stay as active as you can everyday Limit all fluids for the day to less than 2 liters   You are scheduled for a Cardiopulmonary Exercise (CPX) Test as [facility] on: Date:      Time:   Expect to be in the lab for 2 hours. Please plan to arrive 30 minutes prior to your appointment. You may be asked to reschedule your test if you arrive 20 minutes or more after your scheduled appointment time.  Main Campus address: [address] [place], [place] [address] You may arrive to the Main Entrance A or Entrance C (free valet parking is available at both). -Main Entrance A (on [address]) :proceed to admitting for check in -Entrance C (on [employer]): proceed to [employer] parking or under hospital deck parking using this code _________  Check In: [facility] waiting room (1st floor)   General Instructions for the day of the test (Please follow all instructions from your  physician): Refrain from ingesting a heavy meal, alcohol, or caffeine or using tobacco products within 2 hours of the test (DO NOT FAST for mare than 8 hours). You may have all other non-alcoholic, non -caffeinated beverage,a light snack (crackers,a piece of fruit, carrot sticks, toast bagel,etc) up to your appointment. Avoid significant exertion or exercise within 24 hours of your test. Be prepared to exercise and sweat. Your clothing should permit freedom of movement and include walking or running shoes. Women bring loose fitting short sleeved blouse.  This evaluation may be fatiguing and you may wish ti have someone accompany you to the assessment to drive you home afterward. Bring a list of your medications with you, including dosage and frequency you take the medications (  I.e.,once per day, twice per day, etc). Take all medications as prescribed, unless noted below or instructed to do so by your physician.  Please do not take the following medications prior to your   CPX:  Brief description of the test: A brief lung test will be performed. This will involve you taking deep breaths and blowing hard and fast through your mouth. During these , a clip will be on your nose and you will be breathing through a breathing device.   For the exercise portion of the test you will be walking on a treadmill, or riding a stationary bike, to your maximal effor or until symptoms such as chest pain, shortness of breath, leg pain or dizziness limit your exercise. You will be breathing in and out of a breathing device through your mouth (  a clip will be on your nose again). Your heart rate, ECG, blood pressure, oxygen saturations, breathing rate and depth, amount of oxygen you consume and amount of carbon dioxide you produce will be measured and monitored throughout the exercise test.  If you need to cancel or reschedule your appointment please call [phone] If you have further questions please call your  physician or Damien Nunnery at [phone]   Follow-Up in: 1 week   At the [facility], you and your health needs are our priority. We have a designated team specialized in the treatment of Heart Failure. This Care Team includes your primary Heart Failure Specialized Cardiologist (physician), Advanced Practice Providers (APPs- Physician Assistants and Nurse Practitioners), and Pharmacist who all work together to provide you with the care you need, when you need it.   You may see any of the following providers on your designated Care Team at your next follow up:  Dr. Toribio Fuel Dr. Ezra Shuck Dr. Odis Brownie [name], NP Caffie Shed, [place] [facility] [place], [place] Beckey Coe, NP Jordan Lee, NP Tinnie Redman, PharmD   Please be sure to bring in all your medications bottles to every appointment.   Need to Contact Us :  If you have any questions or concerns before your next appointment please send us  a message through [place] or call our office at [phone].    TO LEAVE A MESSAGE FOR THE NURSE SELECT OPTION 2, PLEASE LEAVE A MESSAGE INCLUDING: YOUR NAME DATE OF BIRTH CALL BACK NUMBER REASON FOR CALL**this is important as we prioritize the call backs  YOU WILL RECEIVE A CALL BACK THE SAME DAY AS LONG AS YOU CALL BEFORE 4:00 PM

## 2024-04-30 NOTE — ED Triage Notes (Signed)
 Patient is having chest pain. Pain started 7 days ago and has gotten bad toady. He states that the pain is in left chest and radiates to his back nad up his neck into his head. Denies any current shob, nausea or vomiting. Patient appears anxious at registration, he is pacing back and forth.

## 2024-04-30 NOTE — Progress Notes (Signed)
 "  ADVANCED HEART FAILURE FOLLOW UP CLINIC NOTE  Primary Care: Paseda, Folashade R, FNP Primary Cardiologist: Dr. Zenaida  HPI: Aaron [name] is a 38 y.o. male with history of HFrEF now with HFimpEF, MR s/p Mitral Clip, grave's disease, PE, PAF, and cardiac arrest.      Patient with a longstanding history of primary mitral regurgitation with reduced ejection fraction as well as Grave's disease.  He had had progressive heart failure as an outpatient.  He was admitted in February 2025 for progressive shortness of breath.  He was found to have a small pulmonary embolism without significant right heart strain.  He was given fluids, nodal blockade for A-fib with RVR and subsequently became altered, hypoxic, with ultimate cardiac arrest.  ECMO team was consulted and patient was emergently taken to [facility] for ECMO cannulation with Impella CP drainage.  He was upgraded to a 5.5 and given his severe mitral regurgitation was arranged to have MitraClip performed.  Following MitraClip he was able to come off hemodynamic support.  Course complicated by acute renal failure requiring dialysis that slowly improved over the course of his admission.  He went to CIR and was discharged without hemodialysis catheter.    SUBJECTIVE:  He returns today for HF follow up. Overall feeling unwell. Has been feeling bad since before hospitalization, has not felt like he has gotten better. Does not feel any worse though. Does not have palpitations. ROS reports is very vague. NYHA IIIb. Reports shortness of breath, chest pressure, blurry vision, and fatigue. Able to perform ADLs. Appetite okay. Does not check weight or BP at home. Compliant with all medications.  PMH, current medications, allergies, social history, and family history reviewed in epic.  PHYSICAL EXAM: Vitals:   04/30/24 1016  BP: 102/70  Pulse: (!) 138  SpO2: 98%    Filed Weights   04/30/24 1016  Weight: 104.1 kg (229 lb 9.6 oz)    General:  Melancholy appearing. No distress  Cardiac: JVP flat. No murmurs  Resp: Lung sounds clear and equal B/L Abdomen: Soft, distended.  Extremities: Warm and dry.  No edema.  Neuro: A&O x3. Affect pleasant.   DATA REVIEW  ECG: 10/25: ST 134 bpm, nl QRS 4/25: NSR, rightward axis, normal QRS    ECHO: 9/25: LVEF 55%, normal RV, moderately dilated left atrium, 3 MitraClip's with mean gradient 9  3/25: LVEF 40-45%, LVH, RV ok mild MS with mean gradient 6, PVA 1.6cm2, 3 mitraclip in appropriate position 11/24: LVEF 45-50%, severe MR with bileaflet prolapse, myxomatous valve with flail segment  CATH: 10/2021: Normal coronary angiography  CMR:   6/23: LVEF 49%, small apical transmural LGE consistent with scar  COR CT: 12/25: normal coronaries, calcium  score 0   ASSESSMENT & PLAN:  Chronic heart failure with improved ejection fraction: Previously 40 to 45% after MitraClip, improved to 55% on most recent echocardiogram.  Appears euvolemic. - NYHA class III symptoms, but suspect multifactorial - Increase toprol  XL 50 mg daily - Continue spiro 25 mg daily - Continue Entresto  24/26 mg bid - Hold on starting jardiance with tachycardia - Schedule CPX to determine cardiac role in symptoms  Mitral valve disease: Primary MR, s/p bailout mitraclip x3 after cardiac arrest.  Mild mitral stenosis at heart rate of 88, clips appear well-seated, no significant MR. - Serial echos, follow up in structural clinic  CKD: Labs ordered today, creatinine now 1. - Previously on dialysis - Continue Entresto  as above  Grave's disease: Thankfully has finally  seen endocrinology, considering long-term therapy for his thyroid . - Continue methimazole  15 mg daily - check thyroid  studies today  Atrial fibrillation: Suspected in the setting of thyrotoxicosis and critical illness. Given small apical infarct suspect embolic disease history as well.  - Sinus tach on ECG today - Continue apixaban  5mg  BID   Pulmonary  embolism:  - Small, noted during previous admission. - Continue OAC  Sinus Tachycardia - on ECG rates in 130s - asymptomatic; BP ok - does not appear dry; denies recent illness - no significant change in shortness of breath (does have history of PE) - check TSH, free T4, T3, CBC  Depression: Altered mood, appetite, and evidence of anhedonia.  Suspect multifactorial with thyroid  disease, previous critical illness - Previously discussed psychiatric referral and medications, patient would like to hold off at this time - Considering a service dog  Follow up in 1 weeks with APP (ECG). Discussed that if shortness of breath symptoms progress that he should go to the ED for evaluation.   Eli Adami, NP 04/30/2024  "

## 2024-04-30 NOTE — ED Notes (Signed)
 The pt reports that he has been here too long  he refused the aspirin   he reports that he already takes too much medicine and he walked out the door

## 2024-04-30 NOTE — Progress Notes (Signed)
"   ReDS Vest / Clip - 04/30/24 1016       ReDS Vest / Clip   Station Marker C    Ruler Value 29    ReDS Value Range Low volume    ReDS Actual Value 33          "

## 2024-04-30 NOTE — ED Notes (Signed)
 The pt reports that he is not a dialysis pt although there was a comment in old records stating the he was  removed

## 2024-04-30 NOTE — ED Triage Notes (Signed)
The pt is on eliquis

## 2024-04-30 NOTE — ED Notes (Signed)
Patient seen leaving the ED.

## 2024-04-30 NOTE — ED Provider Triage Note (Signed)
 Emergency Medicine Provider Triage Evaluation Note  Aaron [name] , a 38 y.o. male  was evaluated in triage.  Pt complains of chest pain that has been ongoing for what he describes as months.  Patient states that he is unable to pinpoint a contributing factor as the pain is wax/wane and occurs randomly during activity and rest. Patient states that he has a history of congestive heart failure and last year around this time had an heart attack and the pain that he is experiencing today feels similar. Patient states that he is taking all of his medication and has not missed any doses. Patient states that he currently has no shortness of breath.  Patient is in no acute distress. Review of Systems  Positive: Chest pain Negative: Shortness of breath  Physical Exam  BP (!) 134/94   Pulse (!) 116   Temp 97.6 F (36.4 C)   Resp 16   Ht 6' 4 (1.93 m)   Wt 104.1 kg   SpO2 98%   BMI 27.94 kg/m  Gen:   Awake, no distress   Resp:  Normal effort  MSK:   Moves extremities without difficulty  Other:    Medical Decision Making  Medically screening exam initiated at 6:17 PM.  Appropriate orders placed.  Jerald Villalona Ayo Swart was informed that the remainder of the evaluation will be completed by another provider, this initial triage assessment does not replace that evaluation, and the importance of remaining in the ED until their evaluation is complete.     Willma Duwaine [place], [place] 04/30/24 [phone]

## 2024-05-01 LAB — T3, FREE: T3, Free: 16 pg/mL — ABNORMAL HIGH (ref 2.0–4.4)

## 2024-05-03 ENCOUNTER — Other Ambulatory Visit (HOSPITAL_COMMUNITY): Payer: Self-pay

## 2024-05-03 ENCOUNTER — Other Ambulatory Visit: Payer: Self-pay

## 2024-05-03 MED ORDER — APIXABAN 5 MG PO TABS: 5.0000 mg | ORAL_TABLET | Freq: Two times a day (BID) | ORAL | 3 refills | Status: AC

## 2024-05-03 MED ORDER — METHIMAZOLE 10 MG PO TABS: 20.0000 mg | ORAL_TABLET | Freq: Every day | ORAL | 11 refills | Status: AC

## 2024-05-03 NOTE — Telephone Encounter (Addendum)
 Pt aware, agreeable, and verbalized understanding  Med list updated   ----- Message from Jordan Lee, NP sent at 05/03/2024  8:29 AM EST ----- Per Dr. Mercie, Endocrinology: Again please confirm that he is adhering to his methimazole . If he is not, please have him start taking. If he is taking daily, please increase dose to 20 mg daily.  Endocrinology will be reaching out to him for a urgent appointment.

## 2024-05-04 ENCOUNTER — Telehealth (HOSPITAL_COMMUNITY): Payer: Self-pay

## 2024-05-04 NOTE — Telephone Encounter (Signed)
 Called to confirm/remind patient of their appointment at the [facility] on 05/05/24.   Appointment:   [x] Confirmed  [] Left mess   [] No answer/No voice mail  [] VM Full/unable to leave message  [] Phone not in service  Patient reminded to bring all medications and/or complete list.  Confirmed patient has transportation. Gave directions, instructed to utilize valet parking.

## 2024-05-04 NOTE — Progress Notes (Signed)
 "  ADVANCED HEART FAILURE FOLLOW UP CLINIC NOTE  Primary Care: Paseda, Folashade R, FNP Primary Cardiologist: Dr. Zenaida  HPI: Aaron [name] is a 38 y.o. male with history of HFrEF now with HFimpEF, MR s/p Mitral Clip, grave's disease, PE, PAF, and cardiac arrest.    Patient with a longstanding history of primary mitral regurgitation with reduced ejection fraction as well as Grave's disease.  He had had progressive heart failure as an outpatient.  He was admitted in February 2025 for progressive shortness of breath.  He was found to have a small pulmonary embolism without significant right heart strain.  He was given fluids, nodal blockade for A-fib with RVR and subsequently became altered, hypoxic, with ultimate cardiac arrest.  ECMO team was consulted and patient was emergently taken to [facility] for ECMO cannulation with Impella CP drainage.  He was upgraded to a 5.5 and given his severe mitral regurgitation was arranged to have MitraClip performed.  Following MitraClip he was able to come off hemodynamic support.  Course complicated by acute renal failure requiring dialysis that slowly improved over the course of his admission.  He went to CIR and was discharged without hemodialysis catheter.  Echo 01/16/2024 LVEF 55% RV normal LA mod dilated. 3  well seated mitral clips. Mild Mitral Valve Regurgitation.    He was seen in the HF clinic l12/19/25 and was feeling poorly. Tachycardic with rate in the 130s. Toprol  XL increased 50 mg daily. He later went to the ED with chest pain. He left AMA and did not want to get admitted.    Last week  increased methimazole  to 20 mg daily.   SUBJECTIVE: Today he returns for HF follow up. Complaining of back and neck pain. Denies palpitations. Denies SOB/PND/Orthopnea. Appetite ok. No fever or chills. Taking all medications. He has been taking all methimazole  at the higher dose.    PMH, current medications, allergies, social history, and family history  reviewed in epic.  PHYSICAL EXAM: Vitals:   05/05/24 0943  BP: 110/74  Pulse: (!) 108  SpO2: 98%   Wt Readings from Last 3 Encounters:  05/05/24 103.6 kg (228 lb 6.4 oz)  04/30/24 104.1 kg (229 lb 8 oz)  04/30/24 104.1 kg (229 lb 9.6 oz)     Filed Weights   05/05/24 0943  Weight: 103.6 kg (228 lb 6.4 oz)    General:   No resp difficulty.  Neck: no JVD.  Cor: Tachy Regular rate & rhythm.  Lungs: clear Abdomen: soft, nontender, nondistended.  Extremities: no  edema Neuro: alert & oriented x3  EKG: ST 108 bpm   DATA REVIEW  ECG: 10/25: ST 134 bpm, nl QRS 4/25: NSR, rightward axis, normal QRS    ECHO: 9/25: LVEF 55%, normal RV, moderately dilated left atrium, 3 MitraClip's with mean gradient 9  3/25: LVEF 40-45%, LVH, RV ok mild MS with mean gradient 6, PVA 1.6cm2, 3 mitraclip in appropriate position 11/24: LVEF 45-50%, severe MR with bileaflet prolapse, myxomatous valve with flail segment  CATH: 10/2021: Normal coronary angiography  CMR:   6/23: LVEF 49%, small apical transmural LGE consistent with scar  COR CT: 12/25: normal coronaries, calcium  score 0   ASSESSMENT & PLAN:  Chronic heart failure with improved ejection fraction: Previously 40 to 45% after MitraClip, improved  September 2025  to 55% on most recent echocardiogram.  Given worsening symptoms will need to repeat Echo. He is not sure he want to pursue.  - NYHA II . Appears  euvolemic. Continue lasix  20 mg daily. Refill lasix  today.  -Continue toprol  XL 50 mg daily - Continue spiro 25 mg daily - Continue Entresto  24/26 mg bid - Hold on starting jardiance with tachycardia - Schedule CPX to determine cardiac role in symptoms - Repeat ECHO, he is not sure will come to see him.   Mitral valve disease: Primary MR, s/p bailout mitraclip x3 after cardiac arrest.  Mild mitral stenosis at heart rate of 88, clips appear well-seated, no significant MR. - Serial echos, follow up in structural clinic  CKD:  Labs ordered today, creatinine now 0.8  - Previously on dialysis - Continue Entresto  as above  Grave's disease:  - Continue methimazole  20 mg daily, just increased last week. He has follow up with endocrinology.  - TSH < 0.1, Free T3 4.5  Free T4 1.5   Atrial fibrillation: Suspected in the setting of thyrotoxicosis and critical illness. Given small apical infarct suspect embolic disease history as well.  - EKG 04/30/24 ? Atrial tach - EKG today - Sinus Tachy today  - Continue apixaban  5mg  BID   Pulmonary embolism:  - Small, noted during previous admission. - Continue OAC  Sinus Tachycardia - 04/30/2024 TSH < 0.1, Free T3 4.5  Free T4 1.5  - As noted above.   Depression: .  Suspect multifactorial with thyroid  disease, previous critical illness - Previously discussed psychiatric referral and medications, patient would like to hold off at this time - Considering a service dog  CPX test next week.   Follow up with Dr Zenaida.   Aaron Mosses, NP 05/05/2024  "

## 2024-05-05 ENCOUNTER — Ambulatory Visit (HOSPITAL_COMMUNITY)
Admission: RE | Admit: 2024-05-05 | Discharge: 2024-05-05 | Disposition: A | Discharge status: Home / Self Care | Source: Ambulatory Visit | Attending: Adult Health | Admitting: Adult Health

## 2024-05-05 ENCOUNTER — Encounter (HOSPITAL_COMMUNITY): Payer: Self-pay

## 2024-05-05 VITALS — BP 110/74 | HR 108 | Ht 75.0 in | Wt 228.4 lb

## 2024-05-05 DIAGNOSIS — E0590 Thyrotoxicosis, unspecified without thyrotoxic crisis or storm: Secondary | ICD-10-CM

## 2024-05-05 DIAGNOSIS — F32A Depression, unspecified: Secondary | ICD-10-CM | POA: Diagnosis not present

## 2024-05-05 DIAGNOSIS — I5032 Chronic diastolic (congestive) heart failure: Secondary | ICD-10-CM | POA: Insufficient documentation

## 2024-05-05 DIAGNOSIS — Z8674 Personal history of sudden cardiac arrest: Secondary | ICD-10-CM | POA: Insufficient documentation

## 2024-05-05 DIAGNOSIS — E0500 Thyrotoxicosis with diffuse goiter without thyrotoxic crisis or storm: Secondary | ICD-10-CM | POA: Insufficient documentation

## 2024-05-05 DIAGNOSIS — N189 Chronic kidney disease, unspecified: Secondary | ICD-10-CM | POA: Insufficient documentation

## 2024-05-05 DIAGNOSIS — Z86711 Personal history of pulmonary embolism: Secondary | ICD-10-CM | POA: Insufficient documentation

## 2024-05-05 DIAGNOSIS — R0602 Shortness of breath: Secondary | ICD-10-CM

## 2024-05-05 DIAGNOSIS — R000 Tachycardia, unspecified: Secondary | ICD-10-CM | POA: Insufficient documentation

## 2024-05-05 DIAGNOSIS — I052 Rheumatic mitral stenosis with insufficiency: Secondary | ICD-10-CM | POA: Diagnosis not present

## 2024-05-05 DIAGNOSIS — I480 Paroxysmal atrial fibrillation: Secondary | ICD-10-CM | POA: Insufficient documentation

## 2024-05-05 DIAGNOSIS — I5022 Chronic systolic (congestive) heart failure: Secondary | ICD-10-CM | POA: Diagnosis present

## 2024-05-05 DIAGNOSIS — Z79899 Other long term (current) drug therapy: Secondary | ICD-10-CM | POA: Diagnosis not present

## 2024-05-05 MED ORDER — FUROSEMIDE 20 MG PO TABS: 20.0000 mg | ORAL_TABLET | Freq: Every day | ORAL | 5 refills | Status: AC

## 2024-05-05 NOTE — Progress Notes (Signed)
"   ReDS Vest / Clip - 05/05/24 0943       ReDS Vest / Clip   Station Marker C    Ruler Value 33    ReDS Value Range Low volume    ReDS Actual Value 35        \ "

## 2024-05-05 NOTE — Patient Instructions (Signed)
 Medication Changes:  CONTINUE LASIX  (FUROSEMIDE ) 20MG  ONCE DAILY   Testing/Procedures:  ECHOCARDIOGRAM AS SCHEDULED ON FRIDAY 12/26 AT 8AM   Follow-Up in: AS SCHEDULED WITH DR. ZENAIDA   At the [facility], you and your health needs are our priority. We have a designated team specialized in the treatment of Heart Failure. This Care Team includes your primary Heart Failure Specialized Cardiologist (physician), Advanced Practice Providers (APPs- Physician Assistants and Nurse Practitioners), and Pharmacist who all work together to provide you with the care you need, when you need it.   You may see any of the following providers on your designated Care Team at your next follow up:  Dr. Toribio Fuel Dr. Ezra Shuck Dr. Odis Zenaida [name], NP Caffie Shed, [place] [facility] [place], [place] Beckey Coe, NP Jordan Lee, NP Tinnie Redman, PharmD   Please be sure to bring in all your medications bottles to every appointment.   Need to Contact Us :  If you have any questions or concerns before your next appointment please send us  a message through [place] or call our office at [phone].    TO LEAVE A MESSAGE FOR THE NURSE SELECT OPTION 2, PLEASE LEAVE A MESSAGE INCLUDING: YOUR NAME DATE OF BIRTH CALL BACK NUMBER REASON FOR CALL**this is important as we prioritize the call backs  YOU WILL RECEIVE A CALL BACK THE SAME DAY AS LONG AS YOU CALL BEFORE 4:00 PM

## 2024-05-07 ENCOUNTER — Ambulatory Visit (HOSPITAL_COMMUNITY)
Admission: RE | Admit: 2024-05-07 | Discharge: 2024-05-07 | Disposition: A | Discharge status: Home / Self Care | Source: Ambulatory Visit | Attending: Adult Health | Admitting: Adult Health

## 2024-05-07 ENCOUNTER — Ambulatory Visit (HOSPITAL_COMMUNITY): Payer: Self-pay | Admitting: Adult Health

## 2024-05-07 DIAGNOSIS — I5032 Chronic diastolic (congestive) heart failure: Secondary | ICD-10-CM | POA: Insufficient documentation

## 2024-05-07 DIAGNOSIS — I517 Cardiomegaly: Secondary | ICD-10-CM | POA: Diagnosis not present

## 2024-05-07 DIAGNOSIS — I081 Rheumatic disorders of both mitral and tricuspid valves: Secondary | ICD-10-CM | POA: Insufficient documentation

## 2024-05-07 LAB — ECHOCARDIOGRAM COMPLETE
Area-P 1/2: 1.46 cm2
MV M vel: 4.96 m/s
MV Peak grad: 98.4 mmHg
MV VTI: 1.43 cm2
S' Lateral: 3.9 cm

## 2024-05-12 ENCOUNTER — Encounter (HOSPITAL_COMMUNITY)

## 2024-05-20 ENCOUNTER — Ambulatory Visit (HOSPITAL_COMMUNITY): Attending: Cardiology

## 2024-05-20 DIAGNOSIS — I5032 Chronic diastolic (congestive) heart failure: Secondary | ICD-10-CM | POA: Insufficient documentation

## 2024-05-24 ENCOUNTER — Ambulatory Visit (HOSPITAL_COMMUNITY)
Admission: RE | Admit: 2024-05-24 | Discharge: 2024-05-24 | Disposition: A | Source: Ambulatory Visit | Attending: Cardiology

## 2024-05-24 ENCOUNTER — Encounter (HOSPITAL_COMMUNITY): Payer: Self-pay | Admitting: Cardiology

## 2024-05-24 ENCOUNTER — Ambulatory Visit (HOSPITAL_COMMUNITY)
Admission: RE | Admit: 2024-05-24 | Discharge: 2024-05-24 | Disposition: A | Source: Ambulatory Visit | Attending: Cardiology | Admitting: Cardiology

## 2024-05-24 ENCOUNTER — Other Ambulatory Visit (HOSPITAL_COMMUNITY): Payer: Self-pay | Admitting: Cardiology

## 2024-05-24 VITALS — BP 138/80 | HR 65 | Wt 234.0 lb

## 2024-05-24 DIAGNOSIS — Z952 Presence of prosthetic heart valve: Secondary | ICD-10-CM | POA: Diagnosis not present

## 2024-05-24 DIAGNOSIS — I214 Non-ST elevation (NSTEMI) myocardial infarction: Secondary | ICD-10-CM

## 2024-05-24 DIAGNOSIS — F32A Depression, unspecified: Secondary | ICD-10-CM | POA: Diagnosis not present

## 2024-05-24 DIAGNOSIS — I340 Nonrheumatic mitral (valve) insufficiency: Secondary | ICD-10-CM | POA: Diagnosis present

## 2024-05-24 DIAGNOSIS — I4891 Unspecified atrial fibrillation: Secondary | ICD-10-CM | POA: Insufficient documentation

## 2024-05-24 DIAGNOSIS — R0602 Shortness of breath: Secondary | ICD-10-CM | POA: Diagnosis not present

## 2024-05-24 DIAGNOSIS — Z7901 Long term (current) use of anticoagulants: Secondary | ICD-10-CM | POA: Insufficient documentation

## 2024-05-24 DIAGNOSIS — R002 Palpitations: Secondary | ICD-10-CM

## 2024-05-24 DIAGNOSIS — N189 Chronic kidney disease, unspecified: Secondary | ICD-10-CM | POA: Diagnosis not present

## 2024-05-24 DIAGNOSIS — I5032 Chronic diastolic (congestive) heart failure: Secondary | ICD-10-CM

## 2024-05-24 DIAGNOSIS — Z86711 Personal history of pulmonary embolism: Secondary | ICD-10-CM | POA: Insufficient documentation

## 2024-05-24 DIAGNOSIS — I5022 Chronic systolic (congestive) heart failure: Secondary | ICD-10-CM | POA: Insufficient documentation

## 2024-05-24 DIAGNOSIS — E0500 Thyrotoxicosis with diffuse goiter without thyrotoxic crisis or storm: Secondary | ICD-10-CM | POA: Insufficient documentation

## 2024-05-24 DIAGNOSIS — Z8674 Personal history of sudden cardiac arrest: Secondary | ICD-10-CM | POA: Insufficient documentation

## 2024-05-24 NOTE — Patient Instructions (Signed)
 There has been no changes to your medications. Your provider has recommended that  you wear a Zio Patch for 14 days.  This monitor will record your heart rhythm for our review.  IF you have any symptoms while wearing the monitor please press the button.  If you have any issues with the patch or you notice a red or orange light on it please call the company at [phone].  Once you remove the patch please mail it back to the company as soon as possible so we can get the results.  Your physician recommends that you schedule a follow-up appointment in: 3 months ( April) ** PLEASE CALL THE OFFICE IN MARCH TO ARRANGE YOUR FOLLOW UP APPOINTMENT.**  If you have any questions or concerns before your next appointment please send us  a message through [facility] or call our office at [phone].    TO LEAVE A MESSAGE FOR THE NURSE SELECT OPTION 2, PLEASE LEAVE A MESSAGE INCLUDING: YOUR NAME DATE OF BIRTH CALL BACK NUMBER REASON FOR CALL**this is important as we prioritize the call backs  YOU WILL RECEIVE A CALL BACK THE SAME DAY AS LONG AS YOU CALL BEFORE 4:00 PM  At the [facility], you and your health needs are our priority. As part of our continuing mission to provide you with exceptional heart care, we have created designated Provider Care Teams. These Care Teams include your primary Cardiologist (physician) and Advanced Practice Providers (APPs- Physician Assistants and Nurse Practitioners) who all work together to provide you with the care you need, when you need it.   You may see any of the following providers on your designated Care Team at your next follow up: Dr Toribio Fuel Dr Ezra Shuck Dr. Morene Brownie [name], NP Caffie Shed, [place] [facility] [place], [place] Beckey Coe, NP Jordan Lee, NP Ellouise Class, NP Tinnie Redman, PharmD Jaun Bash, PharmD   Please be sure to bring in all your medications bottles to every appointment.    Thank you  for choosing [facility]

## 2024-05-24 NOTE — Progress Notes (Signed)
 Zio patch placed onto patient.  All instructions and information reviewed with patient, they verbalize understanding with no questions.
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
## 2024-06-09 ENCOUNTER — Other Ambulatory Visit: Payer: Self-pay | Admitting: Internal Medicine

## 2024-06-15 MED ORDER — SPIRONOLACTONE 25 MG PO TABS: 25.0000 mg | ORAL_TABLET | Freq: Every day | ORAL | 3 refills | Status: AC

## 2024-06-15 NOTE — Telephone Encounter (Signed)
 [facility]

## 2024-06-18 NOTE — Addendum Note (Signed)
 Encounter addended by: Debarah Garrison MATSU, RN on: 06/18/2024 10:32 AM  Actions taken: Imaging Exam ended

## 2024-06-21 ENCOUNTER — Ambulatory Visit (HOSPITAL_COMMUNITY)

## 2024-06-21 ENCOUNTER — Ambulatory Visit: Admitting: Physician Assistant

## 2024-07-06 ENCOUNTER — Ambulatory Visit: Admitting: Endocrinology

## 2024-07-09 ENCOUNTER — Ambulatory Visit: Payer: Self-pay | Admitting: Nurse Practitioner
